# Patient Record
Sex: Female | Born: 1950
Health system: Southern US, Community
[De-identification: ages and names within clinical notes are randomized; demographics above are authoritative.]

## PROBLEM LIST (undated history)

## (undated) DIAGNOSIS — I255 Ischemic cardiomyopathy: Secondary | ICD-10-CM

## (undated) DIAGNOSIS — I509 Heart failure, unspecified: Secondary | ICD-10-CM

## (undated) DIAGNOSIS — Z720 Tobacco use: Secondary | ICD-10-CM

## (undated) DIAGNOSIS — I2510 Atherosclerotic heart disease of native coronary artery without angina pectoris: Secondary | ICD-10-CM

## (undated) DIAGNOSIS — E785 Hyperlipidemia, unspecified: Secondary | ICD-10-CM

## (undated) DIAGNOSIS — Z9114 Patient's other noncompliance with medication regimen: Secondary | ICD-10-CM

## (undated) DIAGNOSIS — Z91148 Patient's other noncompliance with medication regimen for other reason: Secondary | ICD-10-CM

## (undated) DIAGNOSIS — K219 Gastro-esophageal reflux disease without esophagitis: Secondary | ICD-10-CM

## (undated) DIAGNOSIS — I10 Essential (primary) hypertension: Secondary | ICD-10-CM

## (undated) HISTORY — DX: Hyperlipidemia, unspecified: E78.5

## (undated) HISTORY — DX: Tobacco use: Z72.0

## (undated) HISTORY — DX: Essential (primary) hypertension: I10

## (undated) HISTORY — DX: Patient's other noncompliance with medication regimen: Z91.14

## (undated) HISTORY — DX: Patient's other noncompliance with medication regimen for other reason: Z91.148

## (undated) HISTORY — PX: CHOLECYSTECTOMY: SHX55

## (undated) HISTORY — PX: PARTIAL HYSTERECTOMY: SHX80

---

## 2014-05-03 ENCOUNTER — Encounter (HOSPITAL_COMMUNITY): Payer: Self-pay

## 2014-05-03 ENCOUNTER — Inpatient Hospital Stay (HOSPITAL_COMMUNITY): Payer: No Typology Code available for payment source

## 2014-05-03 ENCOUNTER — Encounter (HOSPITAL_COMMUNITY): Payer: Self-pay | Admitting: Emergency Medicine

## 2014-05-03 ENCOUNTER — Encounter (HOSPITAL_COMMUNITY)
Admission: EM | Disposition: A | Discharge status: Home / Self Care | Payer: No Typology Code available for payment source | Source: Home / Self Care | Attending: Cardiology

## 2014-05-03 ENCOUNTER — Encounter (HOSPITAL_COMMUNITY)
Admission: EM | Disposition: A | Discharge status: Home / Self Care | Payer: Self-pay | Source: Home / Self Care | Attending: Cardiology

## 2014-05-03 ENCOUNTER — Other Ambulatory Visit: Payer: Self-pay

## 2014-05-03 ENCOUNTER — Ambulatory Visit (HOSPITAL_COMMUNITY): Admit: 2014-05-03 | Payer: No Typology Code available for payment source | Admitting: Cardiology

## 2014-05-03 ENCOUNTER — Inpatient Hospital Stay (HOSPITAL_COMMUNITY)
Admission: EM | Admit: 2014-05-03 | Discharge: 2014-05-06 | DRG: 246 | Disposition: A | Discharge status: Home / Self Care | Payer: No Typology Code available for payment source | Attending: Cardiology | Admitting: Cardiology

## 2014-05-03 DIAGNOSIS — Z720 Tobacco use: Secondary | ICD-10-CM | POA: Diagnosis present

## 2014-05-03 DIAGNOSIS — R7989 Other specified abnormal findings of blood chemistry: Secondary | ICD-10-CM | POA: Diagnosis present

## 2014-05-03 DIAGNOSIS — I059 Rheumatic mitral valve disease, unspecified: Secondary | ICD-10-CM | POA: Diagnosis present

## 2014-05-03 DIAGNOSIS — E7800 Pure hypercholesterolemia, unspecified: Secondary | ICD-10-CM

## 2014-05-03 DIAGNOSIS — I2109 ST elevation (STEMI) myocardial infarction involving other coronary artery of anterior wall: Secondary | ICD-10-CM

## 2014-05-03 DIAGNOSIS — I10 Essential (primary) hypertension: Secondary | ICD-10-CM | POA: Diagnosis present

## 2014-05-03 DIAGNOSIS — R945 Abnormal results of liver function studies: Secondary | ICD-10-CM

## 2014-05-03 DIAGNOSIS — F17200 Nicotine dependence, unspecified, uncomplicated: Secondary | ICD-10-CM | POA: Diagnosis present

## 2014-05-03 DIAGNOSIS — E876 Hypokalemia: Secondary | ICD-10-CM | POA: Diagnosis present

## 2014-05-03 DIAGNOSIS — I255 Ischemic cardiomyopathy: Secondary | ICD-10-CM | POA: Diagnosis present

## 2014-05-03 DIAGNOSIS — I2589 Other forms of chronic ischemic heart disease: Secondary | ICD-10-CM | POA: Diagnosis present

## 2014-05-03 DIAGNOSIS — N179 Acute kidney failure, unspecified: Secondary | ICD-10-CM | POA: Diagnosis not present

## 2014-05-03 DIAGNOSIS — R7401 Elevation of levels of liver transaminase levels: Secondary | ICD-10-CM

## 2014-05-03 DIAGNOSIS — I213 ST elevation (STEMI) myocardial infarction of unspecified site: Secondary | ICD-10-CM | POA: Diagnosis present

## 2014-05-03 DIAGNOSIS — I2510 Atherosclerotic heart disease of native coronary artery without angina pectoris: Secondary | ICD-10-CM

## 2014-05-03 DIAGNOSIS — I509 Heart failure, unspecified: Secondary | ICD-10-CM | POA: Diagnosis present

## 2014-05-03 DIAGNOSIS — I5021 Acute systolic (congestive) heart failure: Secondary | ICD-10-CM | POA: Diagnosis present

## 2014-05-03 DIAGNOSIS — R7309 Other abnormal glucose: Secondary | ICD-10-CM | POA: Diagnosis present

## 2014-05-03 DIAGNOSIS — E785 Hyperlipidemia, unspecified: Secondary | ICD-10-CM | POA: Diagnosis not present

## 2014-05-03 DIAGNOSIS — R740 Nonspecific elevation of levels of transaminase and lactic acid dehydrogenase [LDH]: Secondary | ICD-10-CM

## 2014-05-03 HISTORY — PX: LEFT HEART CATHETERIZATION WITH CORONARY ANGIOGRAM: SHX5451

## 2014-05-03 HISTORY — DX: Gastro-esophageal reflux disease without esophagitis: K21.9

## 2014-05-03 HISTORY — PX: PERCUTANEOUS STENT INTERVENTION: SHX5500

## 2014-05-03 HISTORY — PX: CORONARY ANGIOPLASTY WITH STENT PLACEMENT: SHX49

## 2014-05-03 LAB — CBC
HCT: 43.2 % (ref 36.0–46.0)
Hemoglobin: 14.2 g/dL (ref 12.0–15.0)
MCH: 29.2 pg (ref 26.0–34.0)
MCHC: 32.9 g/dL (ref 30.0–36.0)
MCV: 88.9 fL (ref 78.0–100.0)
Platelets: 358 10*3/uL (ref 150–400)
RBC: 4.86 MIL/uL (ref 3.87–5.11)
RDW: 13.1 % (ref 11.5–15.5)
WBC: 11.5 10*3/uL — ABNORMAL HIGH (ref 4.0–10.5)

## 2014-05-03 LAB — I-STAT TROPONIN, ED: Troponin i, poc: 0.05 ng/mL (ref 0.00–0.08)

## 2014-05-03 LAB — COMPREHENSIVE METABOLIC PANEL
ALT: 19 U/L (ref 0–35)
AST: 20 U/L (ref 0–37)
Albumin: 3.9 g/dL (ref 3.5–5.2)
Alkaline Phosphatase: 182 U/L — ABNORMAL HIGH (ref 39–117)
BUN: 13 mg/dL (ref 6–23)
CO2: 23 mEq/L (ref 19–32)
Calcium: 9.6 mg/dL (ref 8.4–10.5)
Chloride: 103 mEq/L (ref 96–112)
Creatinine, Ser: 0.88 mg/dL (ref 0.50–1.10)
GFR calc Af Amer: 80 mL/min — ABNORMAL LOW (ref >90)
GFR calc non Af Amer: 69 mL/min — ABNORMAL LOW (ref >90)
Glucose, Bld: 156 mg/dL — ABNORMAL HIGH (ref 70–99)
Potassium: 3.4 mEq/L — ABNORMAL LOW (ref 3.7–5.3)
Sodium: 143 mEq/L (ref 137–147)
Total Bilirubin: 0.4 mg/dL (ref 0.3–1.2)
Total Protein: 7.8 g/dL (ref 6.0–8.3)

## 2014-05-03 LAB — I-STAT CHEM 8, ED
BUN: 11 mg/dL (ref 6–23)
Calcium, Ion: 1.13 mmol/L (ref 1.13–1.30)
Chloride: 107 mEq/L (ref 96–112)
Creatinine, Ser: 0.9 mg/dL (ref 0.50–1.10)
Glucose, Bld: 159 mg/dL — ABNORMAL HIGH (ref 70–99)
HCT: 46 % (ref 36.0–46.0)
Hemoglobin: 15.6 g/dL — ABNORMAL HIGH (ref 12.0–15.0)
Potassium: 3.2 mEq/L — ABNORMAL LOW (ref 3.7–5.3)
Sodium: 144 mEq/L (ref 137–147)
TCO2: 22 mmol/L (ref 0–100)

## 2014-05-03 LAB — PROTIME-INR
INR: 0.89 (ref 0.00–1.49)
Prothrombin Time: 11.9 seconds (ref 11.6–15.2)

## 2014-05-03 LAB — MRSA PCR SCREENING: MRSA by PCR: NEGATIVE

## 2014-05-03 LAB — APTT: aPTT: 27 seconds (ref 24–37)

## 2014-05-03 LAB — POCT ACTIVATED CLOTTING TIME: Activated Clotting Time: 692 seconds

## 2014-05-03 SURGERY — LEFT HEART CATHETERIZATION WITH CORONARY ANGIOGRAM
Anesthesia: LOCAL

## 2014-05-03 SURGERY — LEFT HEART CATHETERIZATION WITH CORONARY ANGIOGRAM
Anesthesia: Choice | Laterality: Bilateral

## 2014-05-03 MED ORDER — NITROGLYCERIN 0.4 MG SL SUBL
SUBLINGUAL_TABLET | SUBLINGUAL | Status: AC
  Filled 2014-05-03: qty 1

## 2014-05-03 MED ORDER — CARVEDILOL 6.25 MG PO TABS
6.2500 mg | ORAL_TABLET | Freq: Two times a day (BID) | ORAL | Status: DC
  Administered 2014-05-04 – 2014-05-06 (×5): 6.25 mg via ORAL
  Filled 2014-05-03 (×7): qty 1

## 2014-05-03 MED ORDER — NITROGLYCERIN 0.4 MG SL SUBL
0.4000 mg | SUBLINGUAL_TABLET | SUBLINGUAL | Status: DC | PRN
  Administered 2014-05-03 (×2): 0.4 mg via SUBLINGUAL

## 2014-05-03 MED ORDER — ENOXAPARIN SODIUM 40 MG/0.4ML ~~LOC~~ SOLN
40.0000 mg | Freq: Every day | SUBCUTANEOUS | Status: DC
  Administered 2014-05-04 – 2014-05-05 (×3): 40 mg via SUBCUTANEOUS
  Filled 2014-05-03 (×4): qty 0.4

## 2014-05-03 MED ORDER — SODIUM CHLORIDE 0.9 % IJ SOLN: 3.0000 mL | INTRAMUSCULAR | Status: DC | PRN

## 2014-05-03 MED ORDER — METOPROLOL TARTRATE 1 MG/ML IV SOLN
INTRAVENOUS | Status: AC
  Filled 2014-05-03: qty 5

## 2014-05-03 MED ORDER — TICAGRELOR 90 MG PO TABS
90.0000 mg | ORAL_TABLET | Freq: Two times a day (BID) | ORAL | Status: DC
  Administered 2014-05-03 – 2014-05-06 (×6): 90 mg via ORAL
  Filled 2014-05-03 (×7): qty 1

## 2014-05-03 MED ORDER — MIDAZOLAM HCL 2 MG/2ML IJ SOLN
INTRAMUSCULAR | Status: AC
  Filled 2014-05-03: qty 2

## 2014-05-03 MED ORDER — ALPRAZOLAM 0.25 MG PO TABS
0.2500 mg | ORAL_TABLET | Freq: Two times a day (BID) | ORAL | Status: DC | PRN
  Administered 2014-05-04: 0.25 mg via ORAL
  Filled 2014-05-03: qty 1

## 2014-05-03 MED ORDER — ONDANSETRON HCL 4 MG/2ML IJ SOLN
4.0000 mg | Freq: Four times a day (QID) | INTRAMUSCULAR | Status: DC | PRN
  Administered 2014-05-04: 4 mg via INTRAVENOUS
  Filled 2014-05-03: qty 2

## 2014-05-03 MED ORDER — TICAGRELOR 90 MG PO TABS
ORAL_TABLET | ORAL | Status: AC
  Filled 2014-05-03: qty 2

## 2014-05-03 MED ORDER — SODIUM CHLORIDE 0.9 % IJ SOLN
3.0000 mL | Freq: Two times a day (BID) | INTRAMUSCULAR | Status: DC
  Administered 2014-05-03 – 2014-05-05 (×5): 3 mL via INTRAVENOUS

## 2014-05-03 MED ORDER — SODIUM CHLORIDE 0.9 % IV SOLN
INTRAVENOUS | Status: DC
  Administered 2014-05-03: 10 mL/h via INTRAVENOUS

## 2014-05-03 MED ORDER — HEPARIN SODIUM (PORCINE) 5000 UNIT/ML IJ SOLN
4000.0000 [IU] | INTRAMUSCULAR | Status: AC
  Administered 2014-05-03: 4000 [IU] via INTRAVENOUS

## 2014-05-03 MED ORDER — BIVALIRUDIN 250 MG IV SOLR
INTRAVENOUS | Status: AC
  Filled 2014-05-03: qty 250

## 2014-05-03 MED ORDER — TICAGRELOR 90 MG PO TABS
ORAL_TABLET | ORAL | Status: AC
  Filled 2014-05-03: qty 1

## 2014-05-03 MED ORDER — ASPIRIN 81 MG PO CHEW
81.0000 mg | CHEWABLE_TABLET | Freq: Every day | ORAL | Status: DC
Start: 2014-05-03 — End: 2014-05-06
  Administered 2014-05-04 – 2014-05-06 (×3): 81 mg via ORAL
  Filled 2014-05-03 (×3): qty 1

## 2014-05-03 MED ORDER — METOPROLOL TARTRATE 1 MG/ML IV SOLN
INTRAVENOUS | Status: AC
  Administered 2014-05-03: 5 mg
  Filled 2014-05-03: qty 5

## 2014-05-03 MED ORDER — ZOLPIDEM TARTRATE 5 MG PO TABS: 5.0000 mg | ORAL_TABLET | Freq: Every evening | ORAL | Status: DC | PRN

## 2014-05-03 MED ORDER — ACETAMINOPHEN 325 MG PO TABS
650.0000 mg | ORAL_TABLET | ORAL | Status: DC | PRN
  Administered 2014-05-03: 650 mg via ORAL
  Filled 2014-05-03: qty 2

## 2014-05-03 MED ORDER — ASPIRIN EC 81 MG PO TBEC: 81.0000 mg | DELAYED_RELEASE_TABLET | Freq: Every day | ORAL | Status: DC

## 2014-05-03 MED ORDER — ATORVASTATIN CALCIUM 80 MG PO TABS
80.0000 mg | ORAL_TABLET | Freq: Every day | ORAL | Status: DC
  Administered 2014-05-04 – 2014-05-05 (×2): 80 mg via ORAL
  Filled 2014-05-03 (×3): qty 1

## 2014-05-03 MED ORDER — SODIUM CHLORIDE 0.9 % IV SOLN: 250.0000 mL | INTRAVENOUS | Status: DC | PRN

## 2014-05-03 MED ORDER — ASPIRIN 81 MG PO CHEW: 324.0000 mg | CHEWABLE_TABLET | Freq: Once | ORAL | Status: DC

## 2014-05-03 MED ORDER — HEPARIN SODIUM (PORCINE) 5000 UNIT/ML IJ SOLN
5000.0000 [IU] | Freq: Three times a day (TID) | INTRAMUSCULAR | Status: DC
  Filled 2014-05-03: qty 1

## 2014-05-03 MED ORDER — FUROSEMIDE 10 MG/ML IJ SOLN
40.0000 mg | Freq: Two times a day (BID) | INTRAMUSCULAR | Status: DC
  Administered 2014-05-03: 40 mg via INTRAVENOUS
  Filled 2014-05-03 (×3): qty 4

## 2014-05-03 MED ORDER — SODIUM CHLORIDE 0.9 % IV SOLN
INTRAVENOUS | Status: AC
Start: 2014-05-03 — End: 2014-05-04
  Administered 2014-05-03: 22:00:00 via INTRAVENOUS

## 2014-05-03 MED ORDER — HEPARIN SODIUM (PORCINE) 5000 UNIT/ML IJ SOLN
INTRAMUSCULAR | Status: AC
  Administered 2014-05-03: 4000 [IU] via INTRAVENOUS
  Filled 2014-05-03: qty 1

## 2014-05-03 MED ORDER — LABETALOL HCL 5 MG/ML IV SOLN
10.0000 mg | INTRAVENOUS | Status: DC | PRN
  Administered 2014-05-03 – 2014-05-04 (×2): 10 mg via INTRAVENOUS
  Filled 2014-05-03 (×2): qty 4

## 2014-05-03 MED ORDER — ATORVASTATIN CALCIUM 80 MG PO TABS: 80.0000 mg | ORAL_TABLET | Freq: Every day | ORAL | Status: DC

## 2014-05-03 MED ORDER — NITROGLYCERIN 0.4 MG SL SUBL: 0.4000 mg | SUBLINGUAL_TABLET | SUBLINGUAL | Status: DC | PRN

## 2014-05-03 NOTE — ED Notes (Signed)
Cardiology at bedside.

## 2014-05-03 NOTE — ED Notes (Signed)
Pt placed on zoll pads 

## 2014-05-03 NOTE — ED Notes (Addendum)
EMS-pt was on her way home from work when she pulled her car over because of central chest pain with sob, diaphoresis, and nausea. Pt diaphoretic on arrival to department. Vomiting x 1. 20g(L)hand. 324mg  ASA. Pt very uncomfortable and unable to sit still in the bed.

## 2014-05-03 NOTE — ED Notes (Signed)
EDP at bedside  

## 2014-05-03 NOTE — H&P (Signed)
History and Physical   Patient ID: Deborah Ho MRN: [id number], DOB/AGE: 04/16/1951 63 y.o. Date of Encounter: 05/03/2014  Primary Physician: No primary provider on file. Primary Cardiologist: none  Chief Complaint:  STEMI  HPI: Deborah Ho is a 63 y.o. female with no history of CAD.  She has not had a checkup in over a year, but that denies any history of hypertension, hyperlipidemia or diabetes. She smokes a few cigarettes a day and walks regularly, but does not stick tightly to a hard-healthy diet.  Today she was in her usual state of health when she had sudden onset of nausea and diaphoresis. She thought she had a GI illness after eating sausage for lunch. However, she developed chest pain that reached a 10/10, and was significantly diaphoretic. She tried to drive to the emergency room but had trouble doing this because her pain was so severe. Her symptoms began approximately 5 PM today.  In the emergency room, her ECG was consistent with an anterior STEMI. She was also hypertensive with a systolic blood pressure greater than 170. The Cath Lab was activated. She was given aspirin 324 mg, sublingual nitroglycerin x1, heparin 4000 units, and metoprolol 5 mg IV. Her ECG is improved but she continued to experience severe substernal chest pain. She was taken directly to the cath lab.  She has never had symptoms like this before. She has no history of exertional shortness of breath or chest pain.   Past Medical History  Diagnosis Date  . GERD (gastroesophageal reflux disease)     Probable    Surgical History:  Past Surgical History  Procedure Laterality Date  . Partial hysterectomy    . Cholecystectomy       I have reviewed the patient's current medications. Prior to Admission medications   none   Scheduled Meds: . [facility] HOLD] aspirin  324 mg Oral Once   Continuous Infusions: . sodium chloride 10 mL/hr (05/03/14 1907)   PRN Meds:.[MAR HOLD]  nitroGLYCERIN  Allergies: none  History   Social History  . Marital Status: Widowed    Spouse Name: N/A    Number of Children: N/A  . Years of Education: N/A   Occupational History  . not employed    Social History Main Topics  . Smoking status: Current Every Day Smoker -- 0.25 packs/day for 40 years    Types: Cigarettes  . Smokeless tobacco: Never Used  . Alcohol Use: No  . Drug Use: No  . Sexual Activity: Not on file   Other Topics Concern  . Not on file   Social History Narrative   Patient has 6 brothers and sisters and none have known coronary artery disease. She lives alone in [place], but has several brothers in the area.    Family Status  Relation Status Death Age  . Mother Deceased     no history of CAD  . Father Deceased     no information available    Review of Systems:   Full 14-point review of systems otherwise negative except as noted above.  Physical Exam: Blood pressure 173/100, pulse 98, temperature 96.7 F (35.9 C), temperature source Oral, resp. rate 20, height 5\' 5"  (1.651 m), weight 175 lb (79.379 kg), SpO2 100.00%. General: Well developed, well nourished,female in acute distress. Head: Normocephalic, atraumatic, sclera non-icteric, no xanthomas, nares are without discharge. Dentition: poor Neck: No carotid bruits. JVD not elevated, but difficult to assess due to body habitus. No thyromegally Lungs: Good expansion  bilaterally. without wheezes or rhonchi. Bilateral Rales Heart: Regular rate and rhythm with S1 S2.  No S3 or S4.  No murmur, no rubs, or gallops appreciated. Abdomen: Soft, non-tender, non-distended with normoactive bowel sounds. No hepatomegaly. No rebound/guarding. No obvious abdominal masses. Msk:  Strength and tone appear normal for age. No joint deformities or effusions, no spine or costo-vertebral angle tenderness. Extremities: No clubbing or cyanosis. No edema.  Distal pedal pulses are 2+ in 4 extrem Neuro: Alert and oriented  X 3. Moves all extremities spontaneously. No focal deficits noted. Psych:  Responds to questions appropriately with a normal affect. Skin: No rashes or lesions noted  Labs:   Lab Results  Component Value Date   WBC 11.5* 05/03/2014   HGB 15.6* 05/03/2014   HCT 46.0 05/03/2014   MCV 88.9 05/03/2014   PLT 358 05/03/2014    Recent Labs  05/03/14 1850  INR 0.89     Recent Labs Lab 05/03/14 1850 05/03/14 1857  NA 143 144  K 3.4* 3.2*  CL 103 107  CO2 23  --   BUN 13 11  CREATININE 0.88 0.90  CALCIUM 9.6  --   PROT 7.8  --   BILITOT 0.4  --   ALKPHOS 182*  --   ALT 19  --   AST 20  --   GLUCOSE 156* 159*    Recent Labs  05/03/14 1855  TROPIPOC 0.05    Radiology/Studies: pending   ECG: initial ECG was sinus rhythm with a 3 mm anterior ST elevation. Repeat ECG showed improvement in the ST elevations but is still abnormal. Additionally she was having frequent PVCs and pairs on telemetry.  ASSESSMENT AND PLAN:  Principal Problem:   ST elevation myocardial infarction (STEMI) of anterior wall, initial episode of care - she is being taken directly to the Cath Lab with further evaluation and treatment depending on the results. She will be counseled on smoking cessation. She'll be screened for cardiac risk factors including diabetes, hypertension and hyperlipidemia. She will be educated on heart-healthy lifestyle modifications. Further evaluation and treatment will depend on the results.   Melida Quitter, PA-C 05/03/2014 7:33 PM Beeper [phone]  Patient seen and examined and history reviewed. Agree with above findings and plan. 63 yo BF with history of tobacco abuse but otherwise no known risk factors. Acute onset of chest pain today. Ecg shows anterior ST elevation. Pain waxing and waning in intensity. Reports remote cardiac cath in [place] years ago without significant disease. Recommend emergent cardiac cath with PCI. Assess risk factors with lipid panel and A1c.  She has rales on exam and will need diuresis.   Graciela Plato [place], MDFACC 05/03/2014 8:07 PM

## 2014-05-03 NOTE — ED Notes (Signed)
Labs being drawn at this time.

## 2014-05-03 NOTE — ED Notes (Signed)
Transported to cath lab 

## 2014-05-03 NOTE — Progress Notes (Signed)
Chaplains responded earlier to STEMI. Patient relocated to [facility]. Pt requested prayer and to call a family member.  Deborah Ho

## 2014-05-03 NOTE — CV Procedure (Signed)
    Cardiac Catheterization Procedure Note  Name: Deborah Ho MRN: [id number] DOB: Jul 05, 1951  Procedure: Left Heart Cath, Selective Coronary Angiography, LV angiography, PTCA and stenting of the proximal LAD  Indication: 64 yo BF with an acute anterior STEMI  Procedural Details:  The right wrist was prepped, draped, and anesthetized with 1% lidocaine. Using the modified Seldinger technique, a 6 French slender sheath was introduced into the right radial artery. 3 mg of verapamil was administered through the sheath, weight-based unfractionated heparin was administered intravenously. Standard Judkins catheters were used for selective coronary angiography and left ventriculography. Catheter exchanges were performed over an exchange length guidewire.  PROCEDURAL FINDINGS Hemodynamics: AO 162/105 mean 130 mm Hg LV 158/26 mm Hg   Coronary angiography: Coronary dominance: right  Left mainstem: Normal  Left anterior descending (LAD): 95% proximal LAD stenosis with TIMI 2 flow.  Ramus intermediate: moderate sized vessel. Mild disease less than 20%.  Left circumflex (LCx): The LCx has 30% proximal disease. The LCx trifurcates into 3 OM branches. After the first branch there is a 60-70% stenosis in the OM prior to the next bifurcation.   Right coronary artery (RCA): The RCA arises low in the coronary cusp and was engaged with a Williams right catheter. This demonstrates a long segment of disease with 70-80% disease proximally tapering to 80-90% in the mid vessel.   Left ventriculography: Left ventricular systolic function is abnormal, LVEF is estimated at 35%, there is severe anterior hypokinesis with akinesis of the anterior apex and inferior apex. There is mild mitral regurgitation   PCI Note:  Following the diagnostic procedure, the decision was made to proceed with PCI.  Weight-based bivalirudin was given for anticoagulation. Brilinta 180 mg was given orally. Once a therapeutic ACT was  achieved, a 6 [place] XBLAD 3.5 guide catheter was inserted.  A prowater coronary guidewire was used to cross the lesion.  The lesion was predilated with a 2.0 mm balloon.  The lesion was then stented with a 3.0 x 23 mm Xience Alpine stent.  The stent was postdilated with a 3.25 mm noncompliant balloon.  Following PCI, there was 0% residual stenosis and TIMI-3 flow. Final angiography confirmed an excellent result. The patient tolerated the procedure well. There were no immediate procedural complications. A TR band was used for radial hemostasis. The patient was transferred to the post catheterization recovery area for further monitoring.  PCI Data: Vessel - LAD/Segment - proximal Percent Stenosis (pre)  95% TIMI-flow 2 Stent 3.0 x 23 mm Xience Alpine Percent Stenosis (post) 0% TIMI-flow (post) 3  Final Conclusions:   1. Severe 3 vessel obstructive CAD. Culprit lesion in the proximal LAD. 2. Severe LV dysfunction. 3. Successful stenting of the proximal LAD with a DES.   Recommendations:  Continue DAPT for one year. IV diuresis. BP control. May consider patient for enrollment in Complete trial for her residual disease.   Deborah Ho, MDFACC 05/03/2014, 8:17 PM

## 2014-05-03 NOTE — ED Notes (Signed)
EDP calling STEMI

## 2014-05-03 NOTE — ED Notes (Signed)
2 bags of valuables sent with patient

## 2014-05-04 DIAGNOSIS — I5021 Acute systolic (congestive) heart failure: Secondary | ICD-10-CM

## 2014-05-04 DIAGNOSIS — E7800 Pure hypercholesterolemia, unspecified: Secondary | ICD-10-CM

## 2014-05-04 DIAGNOSIS — I219 Acute myocardial infarction, unspecified: Secondary | ICD-10-CM

## 2014-05-04 DIAGNOSIS — R7402 Elevation of levels of lactic acid dehydrogenase (LDH): Secondary | ICD-10-CM

## 2014-05-04 DIAGNOSIS — R740 Nonspecific elevation of levels of transaminase and lactic acid dehydrogenase [LDH]: Secondary | ICD-10-CM

## 2014-05-04 DIAGNOSIS — F17200 Nicotine dependence, unspecified, uncomplicated: Secondary | ICD-10-CM

## 2014-05-04 DIAGNOSIS — R7401 Elevation of levels of liver transaminase levels: Secondary | ICD-10-CM

## 2014-05-04 DIAGNOSIS — I509 Heart failure, unspecified: Secondary | ICD-10-CM

## 2014-05-04 DIAGNOSIS — I10 Essential (primary) hypertension: Secondary | ICD-10-CM

## 2014-05-04 DIAGNOSIS — I517 Cardiomegaly: Secondary | ICD-10-CM

## 2014-05-04 LAB — LIPID PANEL
Cholesterol: 231 mg/dL — ABNORMAL HIGH (ref 0–200)
HDL: 56 mg/dL (ref >39)
LDL Cholesterol: 151 mg/dL — ABNORMAL HIGH (ref 0–99)
Total CHOL/HDL Ratio: 4.1 RATIO
Triglycerides: 118 mg/dL (ref <150)
VLDL: 24 mg/dL (ref 0–40)

## 2014-05-04 LAB — CBC
HCT: 42.7 % (ref 36.0–46.0)
HCT: 42.1 % (ref 36.0–46.0)
Hemoglobin: 14.3 g/dL (ref 12.0–15.0)
Hemoglobin: 14 g/dL (ref 12.0–15.0)
MCH: 29.3 pg (ref 26.0–34.0)
MCH: 29.3 pg (ref 26.0–34.0)
MCHC: 33.5 g/dL (ref 30.0–36.0)
MCHC: 33.3 g/dL (ref 30.0–36.0)
MCV: 87.5 fL (ref 78.0–100.0)
MCV: 88.1 fL (ref 78.0–100.0)
Platelets: 339 10*3/uL (ref 150–400)
Platelets: 317 10*3/uL (ref 150–400)
RBC: 4.88 MIL/uL (ref 3.87–5.11)
RBC: 4.78 MIL/uL (ref 3.87–5.11)
RDW: 13.2 % (ref 11.5–15.5)
RDW: 13 % (ref 11.5–15.5)
WBC: 12.6 10*3/uL — ABNORMAL HIGH (ref 4.0–10.5)
WBC: 14.2 10*3/uL — ABNORMAL HIGH (ref 4.0–10.5)

## 2014-05-04 LAB — CREATININE, SERUM
Creatinine, Ser: 0.78 mg/dL (ref 0.50–1.10)
GFR calc Af Amer: >90 mL/min (ref >90)
GFR calc non Af Amer: 88 mL/min — ABNORMAL LOW (ref >90)

## 2014-05-04 LAB — PRO B NATRIURETIC PEPTIDE: Pro B Natriuretic peptide (BNP): 1714 pg/mL — ABNORMAL HIGH (ref 0–125)

## 2014-05-04 LAB — TROPONIN I: Troponin I: >20 ng/mL (ref <0.30)

## 2014-05-04 LAB — COMPREHENSIVE METABOLIC PANEL
ALT: 37 U/L — ABNORMAL HIGH (ref 0–35)
AST: 269 U/L — ABNORMAL HIGH (ref 0–37)
Albumin: 3.6 g/dL (ref 3.5–5.2)
Alkaline Phosphatase: 159 U/L — ABNORMAL HIGH (ref 39–117)
BUN: 11 mg/dL (ref 6–23)
CO2: 24 mEq/L (ref 19–32)
Calcium: 9.6 mg/dL (ref 8.4–10.5)
Chloride: 100 mEq/L (ref 96–112)
Creatinine, Ser: 0.84 mg/dL (ref 0.50–1.10)
GFR calc Af Amer: 85 mL/min — ABNORMAL LOW (ref >90)
GFR calc non Af Amer: 73 mL/min — ABNORMAL LOW (ref >90)
Glucose, Bld: 112 mg/dL — ABNORMAL HIGH (ref 70–99)
Potassium: 3.8 mEq/L (ref 3.7–5.3)
Sodium: 140 mEq/L (ref 137–147)
Total Bilirubin: 0.5 mg/dL (ref 0.3–1.2)
Total Protein: 7.4 g/dL (ref 6.0–8.3)

## 2014-05-04 LAB — MAGNESIUM: Magnesium: 2 mg/dL (ref 1.5–2.5)

## 2014-05-04 LAB — HEMOGLOBIN A1C
Hgb A1c MFr Bld: 5.8 % — ABNORMAL HIGH (ref <5.7)
Mean Plasma Glucose: 120 mg/dL — ABNORMAL HIGH (ref <117)

## 2014-05-04 MED ORDER — PANTOPRAZOLE SODIUM 40 MG PO TBEC
40.0000 mg | DELAYED_RELEASE_TABLET | Freq: Every day | ORAL | Status: DC
  Administered 2014-05-04 – 2014-05-06 (×3): 40 mg via ORAL
  Filled 2014-05-04 (×3): qty 1

## 2014-05-04 MED ORDER — LISINOPRIL 10 MG PO TABS
10.0000 mg | ORAL_TABLET | Freq: Every day | ORAL | Status: DC
  Administered 2014-05-04: 10 mg via ORAL
  Filled 2014-05-04 (×2): qty 1

## 2014-05-04 MED ORDER — PERFLUTREN LIPID MICROSPHERE
INTRAVENOUS | Status: AC
  Administered 2014-05-04: 4 mL
  Filled 2014-05-04: qty 10

## 2014-05-04 MED ORDER — FUROSEMIDE 40 MG PO TABS
40.0000 mg | ORAL_TABLET | Freq: Every day | ORAL | Status: DC
  Administered 2014-05-04: 40 mg via ORAL
  Filled 2014-05-04 (×2): qty 1

## 2014-05-04 MED ORDER — SPIRONOLACTONE 25 MG PO TABS
25.0000 mg | ORAL_TABLET | Freq: Every day | ORAL | Status: DC
  Administered 2014-05-04: 25 mg via ORAL
  Filled 2014-05-04 (×2): qty 1

## 2014-05-04 MED ORDER — POTASSIUM CHLORIDE CRYS ER 10 MEQ PO TBCR
EXTENDED_RELEASE_TABLET | ORAL | Status: AC
  Filled 2014-05-04: qty 4

## 2014-05-04 MED ORDER — PNEUMOCOCCAL VAC POLYVALENT 25 MCG/0.5ML IJ INJ
0.5000 mL | INJECTION | INTRAMUSCULAR | Status: DC
  Filled 2014-05-04: qty 0.5

## 2014-05-04 MED ORDER — POTASSIUM CHLORIDE CRYS ER 20 MEQ PO TBCR
40.0000 meq | EXTENDED_RELEASE_TABLET | Freq: Once | ORAL | Status: AC
  Administered 2014-05-04: 40 meq via ORAL
  Filled 2014-05-04: qty 2

## 2014-05-04 MED FILL — Sodium Chloride IV Soln 0.9%: INTRAVENOUS | Qty: 50 | Status: AC

## 2014-05-04 NOTE — Progress Notes (Signed)
Utilization review completed. Shanzay Hepworth, RN, BSN. 

## 2014-05-04 NOTE — Progress Notes (Signed)
CARDIAC REHAB PHASE I   PRE:  Rate/Rhythm: 95 SR    BP: sitting 116/66    SaO2:   MODE:  Ambulation: 350 ft   POST:  Rate/Rhythm: 103 ST    BP: sitting 135/74     SaO2:    Tolerated well. Denied problems although seemed somewhat SOB toward end of walk. Pt wanted to lie down after walk due to being up all morning. Began ed however she could not stay awake. Left materials for pt to read and will f/u in am for rest of ed. Pt very inquisitive. [phone]  Deborah Ho [place] CES, ACSM 05/04/2014 3:19 PM

## 2014-05-04 NOTE — Research (Signed)
COMPLETE Informed Consent   Subject Name: Deborah Ho  Subject met inclusion and exclusion criteria.  The informed consent form, study requirements and expectations were reviewed with the subject and questions and concerns were addressed prior to the signing of the consent form.  The subject verbalized understanding of the trail requirements.  The subject agreed to participate in the COMPLETE trial and signed the informed consent.  The informed consent was obtained prior to performance of any protocol-specific procedures for the subject.  A copy of the signed informed consent was given to the subject and a copy was placed in the subject's medical record.  [name] 05/04/2014, 12:33 pm

## 2014-05-04 NOTE — Progress Notes (Signed)
TELEMETRY: Reviewed telemetry pt in NSR with rare PVC triplet, short run of PACs: Filed Vitals:   05/04/14 0400 05/04/14 0505 05/04/14 0557 05/04/14 0735  BP: 180/104 200/104 157/51 152/85  Pulse: 88 92 81 87  Temp: 98.3 F (36.8 C)   98.2 F (36.8 C)  TempSrc: Oral   Oral  Resp: 20 16 18 19   Height:      Weight:      SpO2: 100% 100% 99% 99%    Intake/Output Summary (Last 24 hours) at 05/04/14 0753 Last data filed at 05/04/14 0500  Gross per 24 hour  Intake 349.17 ml  Output   1200 ml  Net -850.83 ml   Filed Weights   05/03/14 1847 05/03/14 2100  Weight: 175 lb (79.379 kg) 209 lb 3.5 oz (94.9 kg)    Subjective Patient complains of severe leg cramps. Chest pain resolved. No dyspnea. Had nausea and vomiting x 1.   05/05/14 aspirin  81 mg Oral Daily  . atorvastatin  80 mg Oral q1800  . carvedilol  6.25 mg Oral BID WC  . enoxaparin (LOVENOX) injection  40 mg Subcutaneous QHS  . furosemide  40 mg Oral Daily  . lisinopril  10 mg Oral Daily  . pantoprazole  40 mg Oral Daily  . sodium chloride  3 mL Intravenous Q12H  . spironolactone  25 mg Oral Daily  . ticagrelor  90 mg Oral BID   . sodium chloride 10 mL/hr (05/03/14 1907)    LABS: Basic Metabolic Panel:  Recent Labs  05/05/14 1850 05/03/14 1857 05/04/14 0201 05/04/14 0504  NA 143 144  --  140  K 3.4* 3.2*  --  3.8  CL 103 107  --  100  CO2 23  --   --  24  GLUCOSE 156* 159*  --  112*  BUN 13 11  --  11  CREATININE 0.88 0.90 0.78 0.84  CALCIUM 9.6  --   --  9.6  MG  --   --   --  2.0   Liver Function Tests:  Recent Labs  05/03/14 1850 05/04/14 0504  AST 20 269*  ALT 19 37*  ALKPHOS 182* 159*  BILITOT 0.4 0.5  PROT 7.8 7.4  ALBUMIN 3.9 3.6   No results found for this basename: LIPASE, AMYLASE,  in the last 72 hours CBC:  Recent Labs  05/04/14 0201 05/04/14 0504  WBC 12.6* 14.2*  HGB 14.3 14.0  HCT 42.7 42.1  MCV 87.5 88.1  PLT 339 317   Cardiac Enzymes:  Recent Labs  05/04/14 0201    TROPONINI >20.00*   BNP:  Recent Labs  05/04/14 0201  PROBNP 1714.0*   Hemoglobin A1C: No results found for this basename: HGBA1C,  in the last 72 hours Fasting Lipid Panel:  Recent Labs  05/04/14 0504  CHOL 231*  HDL 56  LDLCALC 151*  TRIG 118  CHOLHDL 4.1   Thyroid Function Tests: No results found for this basename: TSH, T4TOTAL, FREET3, T3FREE, THYROIDAB,  in the last 72 hours   Radiology/Studies:  Portable Chest X-ray 1 View  05/04/2014   CLINICAL DATA:  ST elevation myocardial infarction.  EXAM: PORTABLE CHEST - 1 VIEW  COMPARISON:  None.  FINDINGS: The lungs are well-aerated. Vascular congestion is noted, with mildly increased interstitial markings, raising question for minimal interstitial edema. There is no evidence of pleural effusion or pneumothorax.  The cardiomediastinal silhouette is normal in size. No acute osseous abnormalities are seen.  IMPRESSION:  Vascular congestion, with mildly increased interstitial markings, raising question for minimal interstitial edema.   Electronically Signed   By: Roanna Raider M.D.   On: 05/04/2014 01:16    PHYSICAL EXAM General: Well developed, obese, in no acute distress. Head: Normocephalic, atraumatic, sclera non-icteric, oropharynx is clear Neck: Negative for carotid bruits. JVD not elevated. No adenopathy Lungs: Clear bilaterally to auscultation without wheezes, rales, or rhonchi. Breathing is unlabored. Heart: RRR S1 S2 without murmurs, rubs, or gallops.  Abdomen: Soft, non-tender, non-distended with normoactive bowel sounds. No hepatomegaly. No rebound/guarding. No obvious abdominal masses. Msk:  Strength and tone appears normal for age. Extremities: No clubbing, cyanosis or edema.  Distal pedal pulses are 2+ and equal bilaterally. Radial cath site without hematoma. Neuro: Alert and oriented X 3. Moves all extremities spontaneously. Psych:  Responds to questions appropriately with a normal affect.  ASSESSMENT AND  PLAN: 1. Anterior STEMI. S/p DES of LAD. On ASA and Brilinta for at least one year. Needs to avoid NSAIDs. On PPI. Troponin > 20. Elevated transaminases due to MI. Significant LV dysfunction by cath. Will check Echo today. Adjust medical therapy with beta blocker, ACEi, aldactone, and statin.  2. Acute systolic CHF. EF 35% by cath. Check Echo. Adjust meds as above. Lungs much clearer today. Will switch IV lasix to po. Sodium restriction. Will need reassessment of LV function in 2-3 months  3. HTN. Poorly controlled. Patient states BP normal on last physical one year ago but now clearly quite high. Adjust meds as noted above.  4. Hypercholesterolemia. On high dose statin.   5. Hyperglycemia. 150s on admit. Now 112. A1c pending.   6. Elevated transaminases acute due to MI. Repeat in am.  7. Tobacco use. Patient reports infrequent smoking. Recommend cessation.  Will observe in ICU today. I spent 30 minutes in teaching/ counseling patient today.   Present on Admission:  . ST elevation myocardial infarction (STEMI) of anterior wall, initial episode of care . ST elevation myocardial infarction (STEMI) of anterior wall . STEMI (ST elevation myocardial infarction)  Signed, Peter [place], MDFACC 05/04/2014 7:53 AM

## 2014-05-04 NOTE — ED Provider Notes (Signed)
CSN: [id number]     Arrival date & time 05/03/14  1839 History   First MD Initiated Contact with Patient 05/03/14 1840     Chief Complaint  Patient presents with  . Chest Pain  . Shortness of Breath     (Consider location/radiation/quality/duration/timing/severity/associated sxs/prior Treatment) Patient is a 63 y.o. female presenting with chest pain and shortness of breath. The history is provided by the patient.  Chest Pain Pain location:  L chest Associated symptoms: shortness of breath   Associated symptoms: no abdominal pain, no back pain and no fatigue   Shortness of Breath Associated symptoms: chest pain   Associated symptoms: no abdominal pain    patient presents with left-sided chest pain that began about one hour prior to arrival. His pressure in her left chest and posterior left jaw and somewhat to her back. Initial EKG the EMS was concerning for inferior ST elevation. On arrival to the ER she had ST elevation anteriorly. Code STEMI was called. No GI bleeding. She's had mild nausea and some shortness of breath. She does not have known cardiac history, but states she has had a heart catheterization in the past.  Past Medical History  Diagnosis Date  . GERD (gastroesophageal reflux disease)     Probable   Past Surgical History  Procedure Laterality Date  . Partial hysterectomy    . Cholecystectomy     History reviewed. No pertinent family history. History  Substance Use Topics  . Smoking status: Current Every Day Smoker -- 0.25 packs/day for 40 years    Types: Cigarettes  . Smokeless tobacco: Never Used  . Alcohol Use: No   OB History   Grav Para Term Preterm Abortions TAB SAB Ect Mult Living                 Review of Systems  Constitutional: Negative for appetite change and fatigue.  Respiratory: Positive for shortness of breath.   Cardiovascular: Positive for chest pain.  Gastrointestinal: Negative for abdominal pain.  Musculoskeletal: Negative for back  pain.  Skin: Negative for wound.  Neurological: Negative for syncope and light-headedness.  Psychiatric/Behavioral: Negative for confusion.      Allergies  Review of patient's allergies indicates no known allergies.  Home Medications   Prior to Admission medications   Not on File   BP 153/104  Pulse 86  Temp(Src) 96.7 F (35.9 C) (Oral)  Resp 28  Ht 5' 5"  (1.651 m)  Wt 175 lb (79.379 kg)  BMI 29.12 kg/m2  SpO2 99% Physical Exam  Constitutional: She is oriented to person, place, and time. She appears well-developed and well-nourished.  Patient is obese  HENT:  Head: Normocephalic and atraumatic.  Eyes: EOM are normal. Pupils are equal, round, and reactive to light.  Neck: Normal range of motion.  Cardiovascular: Normal rate and regular rhythm.   Pulmonary/Chest: Effort normal.  Abdominal: Soft. Bowel sounds are normal. There is no tenderness.  Musculoskeletal: She exhibits no edema.  Neurological: She is alert and oriented to person, place, and time.  Skin: Skin is warm. She is diaphoretic.    ED Course  Procedures (including critical care time) Labs Review Labs Reviewed  CBC - Abnormal; Notable for the following:    WBC 11.5 (*)    All other components within normal limits  COMPREHENSIVE METABOLIC PANEL - Abnormal; Notable for the following:    Potassium 3.4 (*)    Glucose, Bld 156 (*)    Alkaline Phosphatase 182 (*)  GFR calc non Af Amer 69 (*)    GFR calc Af Amer 80 (*)    All other components within normal limits  I-STAT CHEM 8, ED - Abnormal; Notable for the following:    Potassium 3.2 (*)    Glucose, Bld 159 (*)    Hemoglobin 15.6 (*)    All other components within normal limits  MRSA PCR SCREENING  APTT  PROTIME-INR  COMPREHENSIVE METABOLIC PANEL  LIPID PANEL  HEMOGLOBIN A1C  PRO B NATRIURETIC PEPTIDE  CBC  CBC  CREATININE, SERUM  TROPONIN I  I-STAT TROPOININ, ED  POCT ACTIVATED CLOTTING TIME    Imaging Review No results found.    EKG Interpretation   Date/Time:  Thursday May 03 2014 18:42:46 EDT Ventricular Rate:  105 PR Interval:  165 QRS Duration: 73 QT Interval:  352 QTC Calculation: 465 R Axis:   31 Text Interpretation:  ** ** ACUTE MI / STEMI ** ** Sinus tachycardia  Ventricular premature complex Aberrant conduction of SV complex(es)  Anterior infarct, acute (LAD) Baseline wander in lead(s) III V5 Confirmed  by Laconda Basich  MD, Ovid Curd [phone]) on 05/03/2014 6:49:11 PM      MDM   Final diagnoses:  ST elevation myocardial infarction (STEMI), unspecified artery    Patient with acute ST elevation MI. Code STEMI called. Met in the ER by Dr. [place]. Taken emergently to cath lab upon arrival of the team.    Jasper Riling. Alvino Chapel, MD 05/04/14 4353

## 2014-05-04 NOTE — Progress Notes (Addendum)
  Echocardiogram 2D Echocardiogram with Definity has been performed.  Cathie Beams 05/04/2014, 12:27 PM

## 2014-05-05 DIAGNOSIS — N179 Acute kidney failure, unspecified: Secondary | ICD-10-CM

## 2014-05-05 LAB — HEPATIC FUNCTION PANEL
ALT: 30 U/L (ref 0–35)
AST: 105 U/L — ABNORMAL HIGH (ref 0–37)
Albumin: 3.6 g/dL (ref 3.5–5.2)
Alkaline Phosphatase: 144 U/L — ABNORMAL HIGH (ref 39–117)
Bilirubin, Direct: <0.2 mg/dL (ref 0.0–0.3)
Total Bilirubin: 0.7 mg/dL (ref 0.3–1.2)
Total Protein: 7.4 g/dL (ref 6.0–8.3)

## 2014-05-05 LAB — BASIC METABOLIC PANEL
BUN: 23 mg/dL (ref 6–23)
CO2: 22 mEq/L (ref 19–32)
Calcium: 9.4 mg/dL (ref 8.4–10.5)
Chloride: 98 mEq/L (ref 96–112)
Creatinine, Ser: 1.35 mg/dL — ABNORMAL HIGH (ref 0.50–1.10)
GFR calc Af Amer: 48 mL/min — ABNORMAL LOW (ref >90)
GFR calc non Af Amer: 41 mL/min — ABNORMAL LOW (ref >90)
Glucose, Bld: 108 mg/dL — ABNORMAL HIGH (ref 70–99)
Potassium: 3.6 mEq/L — ABNORMAL LOW (ref 3.7–5.3)
Sodium: 139 mEq/L (ref 137–147)

## 2014-05-05 MED ORDER — LISINOPRIL 5 MG PO TABS
5.0000 mg | ORAL_TABLET | Freq: Every day | ORAL | Status: DC
  Filled 2014-05-05: qty 1

## 2014-05-05 MED ORDER — POTASSIUM CHLORIDE CRYS ER 10 MEQ PO TBCR
EXTENDED_RELEASE_TABLET | ORAL | Status: AC
  Filled 2014-05-05: qty 4

## 2014-05-05 MED ORDER — POTASSIUM CHLORIDE CRYS ER 20 MEQ PO TBCR
40.0000 meq | EXTENDED_RELEASE_TABLET | Freq: Once | ORAL | Status: AC
  Administered 2014-05-05: 40 meq via ORAL
  Filled 2014-05-05: qty 2

## 2014-05-05 NOTE — Progress Notes (Signed)
CARDIAC REHAB PHASE I   PRE:  Rate/Rhythm: 97 SR  BP:  Sitting: 115/60     SaO2: 96 RA  MODE:  Ambulation: 350 ft   POST:  Rate/Rhythm: 100  BP:  Sitting: 114/55    SaO2:   Pt tolerated ambulation well with no c/o and no SOB. Pt walked 350 ft independently.  Reviewed education, exercise guidelines, smoking cessation and NTG use with pt.  Pt very responsive during education and stated she did not have questions at this time.  Pt interested in CRP II in [place].  [phone]  Marvene Staff MS, ACSM RCEP 9:22 AM 05/05/2014

## 2014-05-05 NOTE — Progress Notes (Signed)
TELEMETRY: Reviewed telemetry pt in NSR with rare PVC: Filed Vitals:   05/05/14 0405 05/05/14 0500 05/05/14 0600 05/05/14 0700  BP: 105/52 110/67 112/53   Pulse:      Temp: 98.3 F (36.8 C)     TempSrc: Oral     Resp:      Height:      Weight:    206 lb 5.6 oz (93.6 kg)  SpO2: 97%       Intake/Output Summary (Last 24 hours) at 05/05/14 0835 Last data filed at 05/04/14 1700  Gross per 24 hour  Intake    450 ml  Output    300 ml  Net    150 ml   Filed Weights   05/03/14 1847 05/03/14 2100 05/05/14 0700  Weight: 175 lb (79.379 kg) 209 lb 3.5 oz (94.9 kg) 206 lb 5.6 oz (93.6 kg)    Subjective Patient complains of some leg cramps. No chest pain or dyspnea. Feels well overall.   Marland Kitchen aspirin  81 mg Oral Daily  . atorvastatin  80 mg Oral q1800  . carvedilol  6.25 mg Oral BID WC  . enoxaparin (LOVENOX) injection  40 mg Subcutaneous QHS  . lisinopril  10 mg Oral Daily  . pantoprazole  40 mg Oral Daily  . pneumococcal 23 valent vaccine  0.5 mL Intramuscular Tomorrow-1000  . sodium chloride  3 mL Intravenous Q12H  . ticagrelor  90 mg Oral BID   . sodium chloride 10 mL/hr (05/03/14 1907)    LABS: Basic Metabolic Panel:  Recent Labs  16/10/96 1857  05/04/14 0504 05/05/14 0305  NA 144  --  140 139  K 3.2*  --  3.8 3.6*  CL 107  --  100 98  CO2  --   --  24 22  GLUCOSE 159*  --  112* 108*  BUN 11  --  11 23  CREATININE 0.90  < > 0.84 1.35*  CALCIUM  --   --  9.6 9.4  MG  --   --  2.0  --   < > = values in this interval not displayed. Liver Function Tests:  Recent Labs  05/03/14 1850 05/04/14 0504  AST 20 269*  ALT 19 37*  ALKPHOS 182* 159*  BILITOT 0.4 0.5  PROT 7.8 7.4  ALBUMIN 3.9 3.6   No results found for this basename: LIPASE, AMYLASE,  in the last 72 hours CBC:  Recent Labs  05/04/14 0201 05/04/14 0504  WBC 12.6* 14.2*  HGB 14.3 14.0  HCT 42.7 42.1  MCV 87.5 88.1  PLT 339 317   Cardiac Enzymes:  Recent Labs  05/04/14 0201  TROPONINI  >20.00*   BNP:  Recent Labs  05/04/14 0201  PROBNP 1714.0*   Hemoglobin A1C:  Recent Labs  05/04/14 0201  HGBA1C 5.8*   Fasting Lipid Panel:  Recent Labs  05/04/14 0504  CHOL 231*  HDL 56  LDLCALC 151*  TRIG 118  CHOLHDL 4.1   Thyroid Function Tests: No results found for this basename: TSH, T4TOTAL, FREET3, T3FREE, THYROIDAB,  in the last 72 hours   Radiology/Studies:  Portable Chest X-ray 1 View  05/04/2014   CLINICAL DATA:  ST elevation myocardial infarction.  EXAM: PORTABLE CHEST - 1 VIEW  COMPARISON:  None.  FINDINGS: The lungs are well-aerated. Vascular congestion is noted, with mildly increased interstitial markings, raising question for minimal interstitial edema. There is no evidence of pleural effusion or pneumothorax.  The cardiomediastinal silhouette is normal  in size. No acute osseous abnormalities are seen.  IMPRESSION: Vascular congestion, with mildly increased interstitial markings, raising question for minimal interstitial edema.   Electronically Signed   By: Roanna Raider M.D.   On: 05/04/2014 01:16    PHYSICAL EXAM General: Well developed, obese, in no acute distress. Head: Normocephalic, atraumatic, sclera non-icteric, oropharynx is clear Neck: Negative for carotid bruits. JVD not elevated. No adenopathy Lungs: Clear bilaterally to auscultation without wheezes, rales, or rhonchi. Breathing is unlabored. Heart: RRR S1 S2 without murmurs, rubs, or gallops.  Abdomen: Soft, non-tender, non-distended with normoactive bowel sounds. No hepatomegaly. No rebound/guarding. No obvious abdominal masses. Msk:  Strength and tone appears normal for age. Extremities: No clubbing, cyanosis or edema.  Distal pedal pulses are 2+ and equal bilaterally. Radial cath site without hematoma. Neuro: Alert and oriented X 3. Moves all extremities spontaneously. Psych:  Responds to questions appropriately with a normal affect.  ASSESSMENT AND PLAN: 1. Anterior STEMI. S/p DES  of LAD. On ASA and Brilinta for at least one year. Needs to avoid NSAIDs. On PPI. Troponin > 20. Elevated transaminases due to MI. Repeat LFTs pending this am.  EF 30-35% by cath and Echo.  Adjust medical therapy with beta blocker, ACEi, and statin. Enrolled in Complete trial and randomized to medical treatment arm.   2. Acute systolic CHF. EF 30-35%. Adjust meds as above. Lungs much clear. Will DC lasix and aldactone due to acute increase in creatinine.  Sodium restriction. Will need reassessment of LV function in 2-3 months  3. HTN. Now controlled.  Adjust meds as noted above.  4. Hypercholesterolemia. On high dose statin.   5. Glucose intolerance. A1c 5.8%. Dietary modification.  6. AKI. Creatinine 0.88>>0.90>>1.35. Will hold lasix and aldactone and monitor BMET closely.  7. Tobacco use. Patient reports infrequent smoking. Recommend cessation.  8. Hypokalemia- replete  Will transfer to telemetry today. Possible DC tomorrow if renal function improved. Patient is enrolled in the Complete trial and randomized to the medical treatment arm for her residual disease.   Present on Admission:  . ST elevation myocardial infarction (STEMI) of anterior wall, initial episode of care . ST elevation myocardial infarction (STEMI) of anterior wall . STEMI (ST elevation myocardial infarction)  Signed, Peter [place], MDFACC 05/05/2014 8:35 AM

## 2014-05-05 NOTE — Progress Notes (Signed)
Recent b/p 103/59 ,(114/55 (@ 0900 am) discussed w/Ronda Barrett ,PA.   Lisinopril 10 mg po held this am .

## 2014-05-05 NOTE — Progress Notes (Signed)
Info provided re pnuemonia vaccine . Pt considering the info .

## 2014-05-06 ENCOUNTER — Other Ambulatory Visit: Payer: Self-pay | Admitting: Physician Assistant

## 2014-05-06 DIAGNOSIS — N179 Acute kidney failure, unspecified: Secondary | ICD-10-CM | POA: Diagnosis not present

## 2014-05-06 DIAGNOSIS — Z720 Tobacco use: Secondary | ICD-10-CM | POA: Diagnosis present

## 2014-05-06 DIAGNOSIS — I5021 Acute systolic (congestive) heart failure: Secondary | ICD-10-CM | POA: Diagnosis present

## 2014-05-06 DIAGNOSIS — R7989 Other specified abnormal findings of blood chemistry: Secondary | ICD-10-CM | POA: Diagnosis present

## 2014-05-06 DIAGNOSIS — R945 Abnormal results of liver function studies: Secondary | ICD-10-CM

## 2014-05-06 DIAGNOSIS — I255 Ischemic cardiomyopathy: Secondary | ICD-10-CM | POA: Diagnosis present

## 2014-05-06 DIAGNOSIS — E785 Hyperlipidemia, unspecified: Secondary | ICD-10-CM | POA: Diagnosis not present

## 2014-05-06 LAB — BASIC METABOLIC PANEL
BUN: 28 mg/dL — ABNORMAL HIGH (ref 6–23)
CO2: 23 mEq/L (ref 19–32)
Calcium: 9.8 mg/dL (ref 8.4–10.5)
Chloride: 101 mEq/L (ref 96–112)
Creatinine, Ser: 1.42 mg/dL — ABNORMAL HIGH (ref 0.50–1.10)
GFR calc Af Amer: 45 mL/min — ABNORMAL LOW (ref >90)
GFR calc non Af Amer: 39 mL/min — ABNORMAL LOW (ref >90)
Glucose, Bld: 102 mg/dL — ABNORMAL HIGH (ref 70–99)
Potassium: 3.8 mEq/L (ref 3.7–5.3)
Sodium: 139 mEq/L (ref 137–147)

## 2014-05-06 MED ORDER — LISINOPRIL 2.5 MG PO TABS
2.5000 mg | ORAL_TABLET | Freq: Every day | ORAL | Status: DC
  Filled 2014-05-06: qty 1

## 2014-05-06 MED ORDER — ASPIRIN 81 MG PO CHEW: 81.0000 mg | CHEWABLE_TABLET | Freq: Every day | ORAL | Status: DC

## 2014-05-06 MED ORDER — NITROGLYCERIN 0.4 MG SL SUBL: 0.4000 mg | SUBLINGUAL_TABLET | SUBLINGUAL | Status: DC | PRN

## 2014-05-06 MED ORDER — LISINOPRIL 2.5 MG PO TABS
2.5000 mg | ORAL_TABLET | Freq: Every day | ORAL | Status: DC
Start: 2014-05-06 — End: 2014-05-23

## 2014-05-06 MED ORDER — CARVEDILOL 6.25 MG PO TABS: 6.2500 mg | ORAL_TABLET | Freq: Two times a day (BID) | ORAL | Status: DC

## 2014-05-06 MED ORDER — ATORVASTATIN CALCIUM 80 MG PO TABS: 80.0000 mg | ORAL_TABLET | Freq: Every day | ORAL | Status: DC

## 2014-05-06 MED ORDER — TICAGRELOR 90 MG PO TABS: 90.0000 mg | ORAL_TABLET | Freq: Two times a day (BID) | ORAL | Status: DC

## 2014-05-06 NOTE — Progress Notes (Signed)
    SUBJECTIVE:  No pain.  No SOB.   PHYSICAL EXAM Filed Vitals:   05/05/14 1500 05/05/14 2010 05/06/14 0052 05/06/14 0443  BP: 110/73 141/76 132/65 127/55  Pulse: 91 92 86 87  Temp: 98.7 F (37.1 C) 98.5 F (36.9 C) 97.7 F (36.5 C) 98.2 F (36.8 C)  TempSrc: Oral Oral Oral Oral  Resp: 18 19 19 18   Height:      Weight:    208 lb 14.4 oz (94.756 kg)  SpO2: 100% 100% 99% 100%   General:  No distress Lungs:  Clear Heart:  RRR Abdomen:  Positive bowel sounds, no rebound no guarding Extremities:  No edema  LABS:  Results for orders placed during the hospital encounter of 05/03/14 (from the past 24 hour(s))  BASIC METABOLIC PANEL     Status: Abnormal   Collection Time    05/06/14  3:42 AM      Result Value Ref Range   Sodium 139  137 - 147 mEq/L   Potassium 3.8  3.7 - 5.3 mEq/L   Chloride 101  96 - 112 mEq/L   CO2 23  19 - 32 mEq/L   Glucose, Bld 102 (*) 70 - 99 mg/dL   BUN 28 (*) 6 - 23 mg/dL   Creatinine, Ser 05/08/14 (*) 0.50 - 1.10 mg/dL   Calcium 9.8  8.4 - 3.00 mg/dL   GFR calc non Af Amer 39 (*) >90 mL/min   GFR calc Af Amer 45 (*) >90 mL/min    Intake/Output Summary (Last 24 hours) at 05/06/14 0815 Last data filed at 05/06/14 0700  Gross per 24 hour  Intake    840 ml  Output   1700 ml  Net   -860 ml    TELEMETRY:    No arrhythmia. Reviewed today.   ASSESSMENT AND PLAN:  ANTERIOR MI:  DES of LAD. On ASA and Brilinta for at least one year.   Continue to hold Lasix and aldactone.   I would send her home on 2.5 mg lisinopril.    TOBACCO ABUSE:  Educated.   AKI:  Creat continues to rise.  However, it seems to be rising very slowly.  I think that this could be checked with a BMET Tuesday at home.    HTN:  Controlled.   OK to discharge with TOC appt within seven days.     Friday [facility] 05/06/2014 8:15 AM

## 2014-05-06 NOTE — Care Management Note (Signed)
    Page 1 of 1   05/06/2014     4:53:16 PM CARE MANAGEMENT NOTE 05/06/2014  Patient:  [facility]   Account Number:  [id number]  Date Initiated:  05/06/2014  Documentation initiated by:  [facility]  Subjective/Objective Assessment:   adm: STEMI     Action/Plan:   discharge planning   Anticipated DC Date:  05/06/2014   Anticipated DC Plan:  HOME/SELF CARE      DC Planning Services  CM consult      Choice offered to / List presented to:             Status of service:  Completed, signed off Medicare Important Message given?   (If response is "NO", the following Medicare IM given date fields will be blank) Date Medicare IM given:   Date Additional Medicare IM given:    Discharge Disposition:  HOME/SELF CARE  Per UR Regulation:    If discussed at Long Length of Stay Meetings, dates discussed:    Comments:  05/06/14 16:20 CM received call from RN who states she sent pt home without Brilinta card, had the pt on the phone and could I do something about it.  CM spoke with pt who states she uses [facility] on [address].  CM activated brilinta card for pt.  CM called pharmacist and explained I would be faxing an activated card over for [facility] who would pick up her prescriptions later today.  CM faxed activated card and pt info to [phone].  CM called pt and informed her the Brinlinta card was faxed and the pharmacy was expecting her later this afternoon.  Though not listed on facesheet, pt has [employer] and therefore not eligible for [employer].  No other Cm needs were communicated. Freddy Jaksch, BSn, CM [phone].

## 2014-05-06 NOTE — Discharge Summary (Signed)
Physician Discharge Summary     Cardiologist:  Jordan(New)  Patient ID: Deborah Ho MRN: [id number] DOB/AGE: 1951/07/16 63 y.o.  Admit date: 05/03/2014 Discharge date: 05/06/2014  Admission Diagnoses: STEMI  Discharge Diagnoses:  Active Problems:   ST elevation myocardial infarction (STEMI) of anterior wall   HLD (hyperlipidemia)   Tobacco abuse   Acute kidney injury   HTN (hypertension)   Cardiomyopathy, ischemic, EF 30-35 % Echo 05/04/14.   Elevated LFTs   Acute systolic CHF (congestive heart failure)   hyperglycemia  Discharged Condition: stable  Hospital Course:   Deborah Ho is a 63 y.o. female with no history of CAD. She has not had a checkup in over a year, but denies any history of hypertension, hyperlipidemia or diabetes. She smokes a few cigarettes a day and walks regularly, but does not stick tightly to a hard-healthy diet.   Today she was in her usual state of health when she had sudden onset of nausea and diaphoresis. She thought she had a GI illness after eating sausage for lunch. However, she developed chest pain that reached a 10/10, and was significantly diaphoretic. She tried to drive to the emergency room but had trouble doing this because her pain was so severe. Her symptoms began approximately 5 PM today. She has never had symptoms like this before. She has no history of exertional shortness of breath or chest pain.  In the emergency room, her ECG was consistent with an anterior STEMI. She was also hypertensive with a systolic blood pressure greater than 170. The Cath Lab was activated. She was given aspirin 324 mg, sublingual nitroglycerin x1, heparin 4000 units, and metoprolol 5 mg IV. Her ECG is improved but she continued to experience severe substernal chest pain. She was taken directly to the cath lab.   Coronary angiography revealed severe three vessel disease and a culprit lesion in the proximal LAD, severe LV dysfunction.  She underwent successful  stenting of the proximal LAD with a DES.  ASA and Brilinta for one year.  She was enrolled in the COMPLETE trial for her residual disease.  Echocardiogram confirms LV dysfunction-EF30-35%, akinesis of the anteroseptal and apical myocardium.  She had acute sys CHF and was treated with IV lasix and switched to PO then discontinued due to worsening kidney function.  Elevated LFTs trended back down.  The patient was seen by Dr. Antoine Poche who felt she was stable for DC home.  We will continue to hold lasix and aldactone.  Low dose ACE added.  Follow up BMET on May 08, 2014.  Cardiac rehab following.      Consults: Cardiac Rehab  Significant Diagnostic Studies: Procedure: Left Heart Cath, Selective Coronary Angiography, LV angiography, PTCA and stenting of the proximal LAD  Indication: 63 yo BF with an acute anterior STEMI  Procedural Details: The right wrist was prepped, draped, and anesthetized with 1% lidocaine. Using the modified Seldinger technique, a 6 French slender sheath was introduced into the right radial artery. 3 mg of verapamil was administered through the sheath, weight-based unfractionated heparin was administered intravenously. Standard Judkins catheters were used for selective coronary angiography and left ventriculography. Catheter exchanges were performed over an exchange length guidewire.  PROCEDURAL FINDINGS  Hemodynamics:  AO 162/105 mean 130 mm Hg  LV 158/26 mm Hg  Coronary angiography:  Coronary dominance: right  Left mainstem: Normal  Left anterior descending (LAD): 95% proximal LAD stenosis with TIMI 2 flow.  Ramus intermediate: moderate sized vessel. Mild disease less than 20%.  Left circumflex (LCx): The LCx has 30% proximal disease. The LCx trifurcates into 3 OM branches. After the first branch there is a 60-70% stenosis in the OM prior to the next bifurcation.  Right coronary artery (RCA): The RCA arises low in the coronary cusp and was engaged with a Williams right  catheter. This demonstrates a long segment of disease with 70-80% disease proximally tapering to 80-90% in the mid vessel.  Left ventriculography: Left ventricular systolic function is abnormal, LVEF is estimated at 35%, there is severe anterior hypokinesis with akinesis of the anterior apex and inferior apex. There is mild mitral regurgitation  PCI Note: Following the diagnostic procedure, the decision was made to proceed with PCI. Weight-based bivalirudin was given for anticoagulation. Brilinta 180 mg was given orally. Once a therapeutic ACT was achieved, a 6 [place] XBLAD 3.5 guide catheter was inserted. A prowater coronary guidewire was used to cross the lesion. The lesion was predilated with a 2.0 mm balloon. The lesion was then stented with a 3.0 x 23 mm Xience Alpine stent. The stent was postdilated with a 3.25 mm noncompliant balloon. Following PCI, there was 0% residual stenosis and TIMI-3 flow. Final angiography confirmed an excellent result. The patient tolerated the procedure well. There were no immediate procedural complications. A TR band was used for radial hemostasis. The patient was transferred to the post catheterization recovery area for further monitoring.  PCI Data:  Vessel - LAD/Segment - proximal  Percent Stenosis (pre) 95%  TIMI-flow 2  Stent 3.0 x 23 mm Xience Alpine  Percent Stenosis (post) 0%  TIMI-flow (post) 3  Final Conclusions:  1. Severe 3 vessel obstructive CAD. Culprit lesion in the proximal LAD.  2. Severe LV dysfunction.  3. Successful stenting of the proximal LAD with a DES.  Recommendations:  Continue DAPT for one year. IV diuresis. BP control. May consider patient for enrollment in Complete trial for her residual disease.  Peter [place], MDFACC  05/03/2014, 8:17 PM     Echocardiogram, Study Conclusions  - Left ventricle: The cavity size was normal. There was mild focal basal hypertrophy of the septum. Systolic function was moderately to severely reduced.  The estimated ejection fraction was in the range of 30% to 35%. There is akinesis of the anteroseptal and apical myocardium. Doppler parameters are consistent with abnormal left ventricular relaxation (grade 1 diastolic dysfunction).  Impressions: - Technically difficult; definity used; anteroseptal and apical akinesis with overall severely reduced LV function.   Treatments:  See above  Discharge Exam: Blood pressure 127/55, pulse 87, temperature 98.2 F (36.8 C), temperature source Oral, resp. rate 18, height 5\' 5"  (1.651 m), weight 208 lb 14.4 oz (94.756 kg), SpO2 100.00%.   Disposition: Final discharge disposition not confirmed      Discharge Instructions   Amb Referral to Cardiac Rehabilitation    Complete by:  As directed      Diet - low sodium heart healthy    Complete by:  As directed      Discharge instructions    Complete by:  As directed   Monitor weight every morning.  If you gain 2 pounds in 24 hours of 5 pounds in a week, call our office([phone]) for instructions on taking additional medication.     Increase activity slowly    Complete by:  As directed             Medication List         aspirin 81 MG chewable tablet  Chew 1  tablet (81 mg total) by mouth daily.     atorvastatin 80 MG tablet  Commonly known as:  LIPITOR  Take 1 tablet (80 mg total) by mouth daily at 6 PM.     carvedilol 6.25 MG tablet  Commonly known as:  COREG  Take 1 tablet (6.25 mg total) by mouth 2 (two) times daily with a meal.     cetirizine 10 MG tablet  Commonly known as:  ZYRTEC  Take 10 mg by mouth daily as needed for allergies.     lisinopril 2.5 MG tablet  Commonly known as:  PRINIVIL,ZESTRIL  Take 1 tablet (2.5 mg total) by mouth daily.     nitroGLYCERIN 0.4 MG SL tablet  Commonly known as:  NITROSTAT  Place 1 tablet (0.4 mg total) under the tongue every 5 (five) minutes x 3 doses as needed for chest pain.     ticagrelor 90 MG Tabs tablet  Commonly known as:   BRILINTA  Take 1 tablet (90 mg total) by mouth 2 (two) times daily.       Follow-up Information   Follow up with Peter [place], MD. (The office will call you with the follow up appt date and time.)    Specialty:  Cardiology   Contact information:   [address] [place] [address] [phone]       Follow up with LABS On 05/08/2014. (A "Basic Metabolic Panel" has been ordered.   It can be done at the address listed or any lab.)    Contact information:   [address] [place] [address] [phone]     Greater than 30 minutes was spent completing the patient's discharge.    SignedWilburt Finlay, PAC 05/06/2014, 9:20 AM  Patient seen and examined.  Plan as discussed in my rounding note for today and outlined above. Fayrene Fearing [facility]  05/06/2014  9:26 AM

## 2014-05-08 ENCOUNTER — Telehealth: Payer: Self-pay | Admitting: Cardiology

## 2014-05-08 ENCOUNTER — Ambulatory Visit (INDEPENDENT_AMBULATORY_CARE_PROVIDER_SITE_OTHER): Payer: No Typology Code available for payment source | Admitting: *Deleted

## 2014-05-08 DIAGNOSIS — N179 Acute kidney failure, unspecified: Secondary | ICD-10-CM

## 2014-05-08 LAB — BASIC METABOLIC PANEL
BUN: 15 mg/dL (ref 6–23)
CO2: 29 mEq/L (ref 19–32)
Calcium: 9 mg/dL (ref 8.4–10.5)
Chloride: 108 mEq/L (ref 96–112)
Creatinine, Ser: 1.2 mg/dL (ref 0.4–1.2)
GFR: 60.14 mL/min (ref >60.00)
Glucose, Bld: 109 mg/dL — ABNORMAL HIGH (ref 70–99)
Potassium: 3.6 mEq/L (ref 3.5–5.1)
Sodium: 142 mEq/L (ref 135–145)

## 2014-05-08 NOTE — Telephone Encounter (Signed)
Patient walked in office she stated voucher for brilanta expired.Brilanta 90 mg samples given to patient.Post hospital appointment scheduled with Dr.Jordan 05/23/14 at the [facility] office.Patient wanted to know if she can return to work Monday 05/14/14.Message sent to Dr.Jordan.

## 2014-05-08 NOTE — Telephone Encounter (Signed)
Pt would like a letter for release to go back to work as well as dates for admission and discharge of hospital stay.

## 2014-05-09 ENCOUNTER — Encounter (HOSPITAL_COMMUNITY)
Admission: EM | Disposition: A | Discharge status: Home / Self Care | Payer: Self-pay | Source: Home / Self Care | Attending: Cardiology

## 2014-05-09 ENCOUNTER — Encounter (HOSPITAL_COMMUNITY): Payer: Self-pay | Admitting: Emergency Medicine

## 2014-05-09 ENCOUNTER — Inpatient Hospital Stay (HOSPITAL_COMMUNITY)
Admission: EM | Admit: 2014-05-09 | Discharge: 2014-05-12 | DRG: 247 | Disposition: A | Discharge status: Home / Self Care | Payer: No Typology Code available for payment source | Attending: Cardiology | Admitting: Cardiology

## 2014-05-09 DIAGNOSIS — I2109 ST elevation (STEMI) myocardial infarction involving other coronary artery of anterior wall: Principal | ICD-10-CM | POA: Diagnosis present

## 2014-05-09 DIAGNOSIS — I252 Old myocardial infarction: Secondary | ICD-10-CM | POA: Diagnosis present

## 2014-05-09 DIAGNOSIS — Z9089 Acquired absence of other organs: Secondary | ICD-10-CM

## 2014-05-09 DIAGNOSIS — Z7902 Long term (current) use of antithrombotics/antiplatelets: Secondary | ICD-10-CM

## 2014-05-09 DIAGNOSIS — I25110 Atherosclerotic heart disease of native coronary artery with unstable angina pectoris: Secondary | ICD-10-CM

## 2014-05-09 DIAGNOSIS — Z79899 Other long term (current) drug therapy: Secondary | ICD-10-CM

## 2014-05-09 DIAGNOSIS — I255 Ischemic cardiomyopathy: Secondary | ICD-10-CM

## 2014-05-09 DIAGNOSIS — I2510 Atherosclerotic heart disease of native coronary artery without angina pectoris: Secondary | ICD-10-CM | POA: Diagnosis present

## 2014-05-09 DIAGNOSIS — I10 Essential (primary) hypertension: Secondary | ICD-10-CM | POA: Diagnosis present

## 2014-05-09 DIAGNOSIS — T82897A Other specified complication of cardiac prosthetic devices, implants and grafts, initial encounter: Secondary | ICD-10-CM | POA: Diagnosis present

## 2014-05-09 DIAGNOSIS — I2589 Other forms of chronic ischemic heart disease: Secondary | ICD-10-CM | POA: Diagnosis present

## 2014-05-09 DIAGNOSIS — Z720 Tobacco use: Secondary | ICD-10-CM

## 2014-05-09 DIAGNOSIS — Y831 Surgical operation with implant of artificial internal device as the cause of abnormal reaction of the patient, or of later complication, without mention of misadventure at the time of the procedure: Secondary | ICD-10-CM | POA: Diagnosis present

## 2014-05-09 DIAGNOSIS — E785 Hyperlipidemia, unspecified: Secondary | ICD-10-CM | POA: Diagnosis present

## 2014-05-09 DIAGNOSIS — Z6835 Body mass index (BMI) 35.0-35.9, adult: Secondary | ICD-10-CM

## 2014-05-09 DIAGNOSIS — E669 Obesity, unspecified: Secondary | ICD-10-CM | POA: Diagnosis present

## 2014-05-09 DIAGNOSIS — F17200 Nicotine dependence, unspecified, uncomplicated: Secondary | ICD-10-CM | POA: Diagnosis present

## 2014-05-09 DIAGNOSIS — K219 Gastro-esophageal reflux disease without esophagitis: Secondary | ICD-10-CM | POA: Diagnosis present

## 2014-05-09 DIAGNOSIS — Z7982 Long term (current) use of aspirin: Secondary | ICD-10-CM

## 2014-05-09 HISTORY — PX: CORONARY ANGIOPLASTY WITH STENT PLACEMENT: SHX49

## 2014-05-09 HISTORY — PX: LEFT HEART CATHETERIZATION WITH CORONARY ANGIOGRAM: SHX5451

## 2014-05-09 LAB — DIFFERENTIAL
Basophils Absolute: 0 10*3/uL (ref 0.0–0.1)
Basophils Relative: 0 % (ref 0–1)
Eosinophils Absolute: 0.1 10*3/uL (ref 0.0–0.7)
Eosinophils Relative: 1 % (ref 0–5)
Lymphocytes Relative: 15 % (ref 12–46)
Lymphs Abs: 1.7 10*3/uL (ref 0.7–4.0)
Monocytes Absolute: 0.4 10*3/uL (ref 0.1–1.0)
Monocytes Relative: 3 % (ref 3–12)
Neutro Abs: 9 10*3/uL — ABNORMAL HIGH (ref 1.7–7.7)
Neutrophils Relative %: 81 % — ABNORMAL HIGH (ref 43–77)

## 2014-05-09 LAB — BASIC METABOLIC PANEL
BUN: 14 mg/dL (ref 6–23)
CO2: 23 mEq/L (ref 19–32)
Calcium: 10.4 mg/dL (ref 8.4–10.5)
Chloride: 106 mEq/L (ref 96–112)
Creatinine, Ser: 0.98 mg/dL (ref 0.50–1.10)
GFR calc Af Amer: 70 mL/min — ABNORMAL LOW (ref >90)
GFR calc non Af Amer: 61 mL/min — ABNORMAL LOW (ref >90)
Glucose, Bld: 135 mg/dL — ABNORMAL HIGH (ref 70–99)
Potassium: 4 mEq/L (ref 3.7–5.3)
Sodium: 147 mEq/L (ref 137–147)

## 2014-05-09 LAB — CBC
HCT: 42.1 % (ref 36.0–46.0)
Hemoglobin: 13.9 g/dL (ref 12.0–15.0)
MCH: 29.6 pg (ref 26.0–34.0)
MCHC: 33 g/dL (ref 30.0–36.0)
MCV: 89.6 fL (ref 78.0–100.0)
Platelets: 357 10*3/uL (ref 150–400)
RBC: 4.7 MIL/uL (ref 3.87–5.11)
RDW: 13.2 % (ref 11.5–15.5)
WBC: 11.1 10*3/uL — ABNORMAL HIGH (ref 4.0–10.5)

## 2014-05-09 LAB — TROPONIN I
Troponin I: >20 ng/mL (ref <0.30)
Troponin I: >20 ng/mL (ref <0.30)

## 2014-05-09 LAB — POCT ACTIVATED CLOTTING TIME
Activated Clotting Time: 242 seconds
Activated Clotting Time: 315 seconds

## 2014-05-09 LAB — PLATELET COUNT: Platelets: 366 10*3/uL (ref 150–400)

## 2014-05-09 LAB — PROTIME-INR
INR: 0.93 (ref 0.00–1.49)
Prothrombin Time: 12.5 seconds (ref 11.6–15.2)

## 2014-05-09 LAB — APTT: aPTT: 25 seconds (ref 24–37)

## 2014-05-09 SURGERY — LEFT HEART CATHETERIZATION WITH CORONARY ANGIOGRAM
Anesthesia: LOCAL

## 2014-05-09 MED ORDER — NITROGLYCERIN IN D5W 200-5 MCG/ML-% IV SOLN
2.0000 ug/min | INTRAVENOUS | Status: DC
  Administered 2014-05-09: 40 ug/min via INTRAVENOUS
  Filled 2014-05-09: qty 250

## 2014-05-09 MED ORDER — PRASUGREL HCL 10 MG PO TABS
ORAL_TABLET | ORAL | Status: AC
  Filled 2014-05-09: qty 6

## 2014-05-09 MED ORDER — ASPIRIN EC 81 MG PO TBEC: 81.0000 mg | DELAYED_RELEASE_TABLET | Freq: Every day | ORAL | Status: DC

## 2014-05-09 MED ORDER — VERAPAMIL HCL 2.5 MG/ML IV SOLN
INTRAVENOUS | Status: AC
  Filled 2014-05-09: qty 2

## 2014-05-09 MED ORDER — FENTANYL CITRATE 0.05 MG/ML IJ SOLN
INTRAMUSCULAR | Status: AC
  Filled 2014-05-09: qty 2

## 2014-05-09 MED ORDER — ONDANSETRON HCL 4 MG/2ML IJ SOLN
INTRAMUSCULAR | Status: AC
  Filled 2014-05-09: qty 2

## 2014-05-09 MED ORDER — PRASUGREL HCL 10 MG PO TABS
10.0000 mg | ORAL_TABLET | Freq: Every day | ORAL | Status: DC
  Administered 2014-05-10 – 2014-05-12 (×3): 10 mg via ORAL
  Filled 2014-05-09 (×3): qty 1

## 2014-05-09 MED ORDER — HEPARIN (PORCINE) IN NACL 2-0.9 UNIT/ML-% IJ SOLN
INTRAMUSCULAR | Status: AC
  Filled 2014-05-09: qty 1500

## 2014-05-09 MED ORDER — HEPARIN SODIUM (PORCINE) 1000 UNIT/ML IJ SOLN
INTRAMUSCULAR | Status: AC
  Filled 2014-05-09: qty 1

## 2014-05-09 MED ORDER — ONDANSETRON HCL 4 MG/2ML IJ SOLN: 4.0000 mg | Freq: Four times a day (QID) | INTRAMUSCULAR | Status: DC | PRN

## 2014-05-09 MED ORDER — NITROGLYCERIN 0.2 MG/ML ON CALL CATH LAB
INTRAVENOUS | Status: AC
  Filled 2014-05-09: qty 1

## 2014-05-09 MED ORDER — LORATADINE 10 MG PO TABS
10.0000 mg | ORAL_TABLET | Freq: Every day | ORAL | Status: DC
Start: 2014-05-09 — End: 2014-05-12
  Administered 2014-05-09 – 2014-05-12 (×4): 10 mg via ORAL
  Filled 2014-05-09 (×4): qty 1

## 2014-05-09 MED ORDER — HEPARIN SODIUM (PORCINE) 5000 UNIT/ML IJ SOLN
60.0000 [IU]/kg | Freq: Once | INTRAMUSCULAR | Status: DC
  Administered 2014-05-09: 4000 [IU] via INTRAVENOUS

## 2014-05-09 MED ORDER — LISINOPRIL 2.5 MG PO TABS
2.5000 mg | ORAL_TABLET | Freq: Every day | ORAL | Status: DC
  Administered 2014-05-09 – 2014-05-12 (×4): 2.5 mg via ORAL
  Filled 2014-05-09 (×4): qty 1

## 2014-05-09 MED ORDER — MIDAZOLAM HCL 2 MG/2ML IJ SOLN
INTRAMUSCULAR | Status: AC
  Filled 2014-05-09: qty 2

## 2014-05-09 MED ORDER — TIROFIBAN HCL IV 5 MG/100ML
0.1500 ug/kg/min | INTRAVENOUS | Status: AC
  Administered 2014-05-09 (×2): 0.15 ug/kg/min via INTRAVENOUS
  Filled 2014-05-09 (×3): qty 100

## 2014-05-09 MED ORDER — ONDANSETRON HCL 4 MG/2ML IJ SOLN
4.0000 mg | Freq: Four times a day (QID) | INTRAMUSCULAR | Status: DC | PRN
  Administered 2014-05-09: 4 mg via INTRAVENOUS
  Filled 2014-05-09: qty 2

## 2014-05-09 MED ORDER — SODIUM CHLORIDE 0.9 % IV SOLN: 1.0000 mL/kg/h | INTRAVENOUS | Status: AC

## 2014-05-09 MED ORDER — ACETAMINOPHEN 325 MG PO TABS: 650.0000 mg | ORAL_TABLET | ORAL | Status: DC | PRN

## 2014-05-09 MED ORDER — HEPARIN SODIUM (PORCINE) 5000 UNIT/ML IJ SOLN
INTRAMUSCULAR | Status: AC
  Filled 2014-05-09: qty 1

## 2014-05-09 MED ORDER — NITROGLYCERIN 0.4 MG SL SUBL: 0.4000 mg | SUBLINGUAL_TABLET | SUBLINGUAL | Status: DC | PRN

## 2014-05-09 MED ORDER — ATORVASTATIN CALCIUM 80 MG PO TABS
80.0000 mg | ORAL_TABLET | Freq: Every day | ORAL | Status: DC
  Administered 2014-05-09 – 2014-05-12 (×4): 80 mg via ORAL
  Filled 2014-05-09 (×4): qty 1

## 2014-05-09 MED ORDER — ASPIRIN 81 MG PO CHEW
324.0000 mg | CHEWABLE_TABLET | Freq: Once | ORAL | Status: AC
  Administered 2014-05-09: 324 mg via ORAL

## 2014-05-09 MED ORDER — NITROGLYCERIN IN D5W 200-5 MCG/ML-% IV SOLN
INTRAVENOUS | Status: AC
  Filled 2014-05-09: qty 250

## 2014-05-09 MED ORDER — ASPIRIN 81 MG PO CHEW
CHEWABLE_TABLET | ORAL | Status: AC
  Filled 2014-05-09: qty 4

## 2014-05-09 MED ORDER — PRASUGREL HCL 10 MG PO TABS
ORAL_TABLET | ORAL | Status: AC
Start: 2014-05-09 — End: 2014-05-09
  Filled 2014-05-09: qty 6

## 2014-05-09 MED ORDER — LIDOCAINE HCL (PF) 1 % IJ SOLN
INTRAMUSCULAR | Status: AC
  Filled 2014-05-09: qty 30

## 2014-05-09 MED ORDER — HYDROMORPHONE HCL PF 1 MG/ML IJ SOLN
1.0000 mg | INTRAMUSCULAR | Status: DC | PRN
  Administered 2014-05-09 (×2): 1 mg via INTRAVENOUS
  Filled 2014-05-09 (×2): qty 1

## 2014-05-09 MED ORDER — CARVEDILOL 6.25 MG PO TABS
6.2500 mg | ORAL_TABLET | Freq: Two times a day (BID) | ORAL | Status: DC
  Administered 2014-05-09 – 2014-05-11 (×5): 6.25 mg via ORAL
  Filled 2014-05-09 (×8): qty 1

## 2014-05-09 MED ORDER — ASPIRIN 81 MG PO CHEW
81.0000 mg | CHEWABLE_TABLET | Freq: Every day | ORAL | Status: DC
  Administered 2014-05-10 – 2014-05-12 (×3): 81 mg via ORAL
  Filled 2014-05-09 (×3): qty 1

## 2014-05-09 MED ORDER — TIROFIBAN HCL IV 5 MG/100ML
INTRAVENOUS | Status: AC
  Filled 2014-05-09: qty 100

## 2014-05-09 NOTE — H&P (Signed)
Cardiologist:  [place]  Deborah Ho is an 63 y.o. female.   Chief Complaint:  Chest pain HPI:  63 yo female who was discharged on June 21 after suffering a STEMi to the LAD.  Coronary angiography revealed severe three vessel disease and a culprit lesion in the proximal LAD, severe LV dysfunction. She underwent successful stenting of the proximal LAD with a DES. ASA and Brilinta for one year. She was enrolled in the COMPLETE trial for her residual disease. Echocardiogram confirmed LV dysfunction-EF30-35%, akinesis of the anteroseptal and apical myocardium. She had acute sys CHF during that admission and was treated with IV lasix and switched to PO then discontinued due to worsening kidney function. Elevated LFTs trended back down.  We held lasix and aldactone. Low dose ACE added.  She presents with 15/10 CP since 0800hrs this morning with nausea and diaphoresis.  She sates she had a hard time getting ASA.  Not sure why.  She also missed one day of Brilinta.  No SOB, LEE, orthopnea.   Medications: Prior to Admission medications   Medication Sig Start Date End Date Taking? Authorizing Provider  aspirin 81 MG chewable tablet Chew 1 tablet (81 mg total) by mouth daily. 05/06/14   Wilburt Finlay, PA-C  atorvastatin (LIPITOR) 80 MG tablet Take 1 tablet (80 mg total) by mouth daily at 6 PM. 05/06/14   Wilburt Finlay, PA-C  carvedilol (COREG) 6.25 MG tablet Take 1 tablet (6.25 mg total) by mouth 2 (two) times daily with a meal. 05/06/14   Wilburt Finlay, PA-C  cetirizine (ZYRTEC) 10 MG tablet Take 10 mg by mouth daily as needed for allergies.    Historical Provider, MD  lisinopril (PRINIVIL,ZESTRIL) 2.5 MG tablet Take 1 tablet (2.5 mg total) by mouth daily. 05/06/14   Wilburt Finlay, PA-C  nitroGLYCERIN (NITROSTAT) 0.4 MG SL tablet Place 1 tablet (0.4 mg total) under the tongue every 5 (five) minutes x 3 doses as needed for chest pain. 05/06/14   Wilburt Finlay, PA-C  ticagrelor (BRILINTA) 90 MG TABS tablet Take 1 tablet  (90 mg total) by mouth 2 (two) times daily. 05/06/14   Wilburt Finlay, PA-C     Past Medical History  Diagnosis Date  . GERD (gastroesophageal reflux disease)     Probable    Past Surgical History  Procedure Laterality Date  . Partial hysterectomy    . Cholecystectomy      No family history on file. Social History:  reports that she has been smoking Cigarettes.  She has a 10 pack-year smoking history. She has never used smokeless tobacco. She reports that she does not drink alcohol or use illicit drugs.  Allergies: No Known Allergies   (Not in a hospital admission)  Results for orders placed in visit on 05/08/14 (from the past 48 hour(s))  BASIC METABOLIC PANEL     Status: Abnormal   Collection Time    05/08/14  2:05 PM      Result Value Ref Range   Sodium 142  135 - 145 mEq/L   Potassium 3.6  3.5 - 5.1 mEq/L   Chloride 108  96 - 112 mEq/L   CO2 29  19 - 32 mEq/L   Glucose, Bld 109 (*) 70 - 99 mg/dL   BUN 15  6 - 23 mg/dL   Creatinine, Ser 1.2  0.4 - 1.2 mg/dL   Calcium 9.0  8.4 - 91.4 mg/dL   GFR 78.29  >56.21 mL/min   No results found.  Review of  Systems  Constitutional: Positive for diaphoresis.  HENT: Negative for sore throat.   Respiratory: Negative for shortness of breath.   Cardiovascular: Positive for chest pain. Negative for orthopnea, leg swelling and PND.  Gastrointestinal: Positive for nausea.  Genitourinary: Negative for hematuria.    Pulse 99, height 5\' 5"  (1.651 m), weight 192 lb (87.091 kg), SpO2 100.00%. Physical Exam  Nursing note and vitals reviewed. Constitutional: She is oriented to person, place, and time. She appears well-developed.  Obese  HENT:  Head: Normocephalic and atraumatic.  Eyes: EOM are normal. Pupils are equal, round, and reactive to light. No scleral icterus.  Neck: Normal range of motion.  Cardiovascular: Normal rate, regular rhythm, S1 normal and S2 normal.   Pulses:      Radial pulses are 2+ on the right side, and 2+ on  the left side.  Respiratory: Effort normal and breath sounds normal. She has no wheezes.  Musculoskeletal: She exhibits no edema.  Neurological: She is alert and oriented to person, place, and time. She exhibits normal muscle tone.  Skin: Skin is warm and dry.  Psychiatric: She has a normal mood and affect.     Assessment/Plan Principal Problem:   ST elevation myocardial infarction (STEMI) of anterior wall Active Problems:   HLD (hyperlipidemia)   Tobacco abuse   HTN (hypertension)   Cardiomyopathy, ischemic, EF 30-35 % Echo 05/04/14.  Plan:  The patient was taken emergently to the cath lab.  05/06/14, PA-C 05/09/2014, 11:48 AM   I have examined the patient and reviewed assessment and plan and discussed with patient.  Agree with above as stated.  She clearly has ECG changes compared to her discharge ECG. She is actively having chest pain. Plan emergent cardiac cath. She has missed a dose of her Brilinta per her report. We will have to stress the importance of secondary prevention and taking her medicines regularly. Further plans based on the cardiac cath results.  VARANASI,JAYADEEP S.

## 2014-05-09 NOTE — ED Provider Notes (Signed)
CSN: [id number]     Arrival date & time 05/09/14  1114 History   None    Chief Complaint  Patient presents with  . Chest Pain     (Consider location/radiation/quality/duration/timing/severity/associated sxs/prior Treatment) HPI Comments: Presents to ER for evaluation of chest pain. Patient reports sudden onset of left-sided chest pain at 8 AM. She reports taking her Brilinta after the pain started and she had some improvement. The pain then came back and has become severe. Patient is reporting 10 out of 10, severe left-sided chest discomfort and pressure with associated nausea and diaphoresis.  She was discharged from the hospital 3 days ago after being treated for an acute STEMI which occurred on June 18.  Patient is a 63 y.o. female presenting with chest pain.  Chest Pain Associated symptoms: diaphoresis and nausea     Past Medical History  Diagnosis Date  . GERD (gastroesophageal reflux disease)     Probable   Past Surgical History  Procedure Laterality Date  . Partial hysterectomy    . Cholecystectomy     No family history on file. History  Substance Use Topics  . Smoking status: Current Every Day Smoker -- 0.25 packs/day for 40 years    Types: Cigarettes  . Smokeless tobacco: Never Used  . Alcohol Use: No   OB History   Grav Para Term Preterm Abortions TAB SAB Ect Mult Living                 Review of Systems  Constitutional: Positive for diaphoresis.  Cardiovascular: Positive for chest pain.  Gastrointestinal: Positive for nausea.  All other systems reviewed and are negative.     Allergies  Review of patient's allergies indicates no known allergies.  Home Medications   Prior to Admission medications   Medication Sig Start Date End Date Taking? Authorizing Provider  aspirin 81 MG chewable tablet Chew 1 tablet (81 mg total) by mouth daily. 05/06/14   Wilburt Finlay, PA-C  atorvastatin (LIPITOR) 80 MG tablet Take 1 tablet (80 mg total) by mouth daily at 6 PM.  05/06/14   Wilburt Finlay, PA-C  carvedilol (COREG) 6.25 MG tablet Take 1 tablet (6.25 mg total) by mouth 2 (two) times daily with a meal. 05/06/14   Wilburt Finlay, PA-C  cetirizine (ZYRTEC) 10 MG tablet Take 10 mg by mouth daily as needed for allergies.    Historical Provider, MD  lisinopril (PRINIVIL,ZESTRIL) 2.5 MG tablet Take 1 tablet (2.5 mg total) by mouth daily. 05/06/14   Wilburt Finlay, PA-C  nitroGLYCERIN (NITROSTAT) 0.4 MG SL tablet Place 1 tablet (0.4 mg total) under the tongue every 5 (five) minutes x 3 doses as needed for chest pain. 05/06/14   Wilburt Finlay, PA-C  ticagrelor (BRILINTA) 90 MG TABS tablet Take 1 tablet (90 mg total) by mouth 2 (two) times daily. 05/06/14   Wilburt Finlay, PA-C   Pulse 99  Ht 5\' 5"  (1.651 m)  Wt 192 lb (87.091 kg)  BMI 31.95 kg/m2  SpO2 100% Physical Exam  Constitutional: She is oriented to person, place, and time. She appears well-developed and well-nourished. No distress.  HENT:  Head: Normocephalic and atraumatic.  Right Ear: Hearing normal.  Left Ear: Hearing normal.  Nose: Nose normal.  Mouth/Throat: Oropharynx is clear and moist and mucous membranes are normal.  Eyes: Conjunctivae and EOM are normal. Pupils are equal, round, and reactive to light.  Neck: Normal range of motion. Neck supple.  Cardiovascular: Regular rhythm, S1 normal and S2 normal.  Exam reveals no gallop and no friction rub.   No murmur heard. Pulmonary/Chest: Effort normal and breath sounds normal. No respiratory distress. She exhibits no tenderness.  Abdominal: Soft. Normal appearance and bowel sounds are normal. There is no hepatosplenomegaly. There is no tenderness. There is no rebound, no guarding, no tenderness at McBurney's point and negative Murphy's sign. No hernia.  Musculoskeletal: Normal range of motion.  Neurological: She is alert and oriented to person, place, and time. She has normal strength. No cranial nerve deficit or sensory deficit. Coordination normal. GCS eye subscore  is 4. GCS verbal subscore is 5. GCS motor subscore is 6.  Skin: Skin is warm, dry and intact. No rash noted. No cyanosis.  Psychiatric: She has a normal mood and affect. Her speech is normal and behavior is normal. Thought content normal.    ED Course  Procedures (including critical care time) Labs Review Labs Reviewed  CBC  DIFFERENTIAL  PROTIME-INR  APTT  BASIC METABOLIC PANEL    Imaging Review No results found.   EKG Interpretation   Date/Time:  Wednesday May 09 2014 11:19:44 EDT Ventricular Rate:  95 PR Interval:  168 QRS Duration: 76 QT Interval:  358 QTC Calculation: 449 R Axis:   -4 Text Interpretation:  Normal sinus rhythm Low voltage QRS Anteroseptal  infarct , possibly acute Inferolateral injury pattern  ACUTE  MI   Abnormal ECG Confirmed by Karion Cudd  MD, Gabriell Daigneault ([id number])  on 05/09/2014 11:28:38 AM      MDM   Final diagnoses:  None   STEMI  Patient presents to ER with acute onset chest pain. EKG shows significant ST elevations in V1 through V5 with suggestion of elevations inferiorly as well. Patient is just treated for ST elevation MI on June 18 with a similar pattern. EKG today is actually more pronounced than it was with her initial presentation. Her records reveals that after her procedure, elevations had resolved, this is a new process. Patient was therefore called as a STEMI, brought directly to the cath lab.    Gilda Crease, MD 05/09/14 [phone]

## 2014-05-09 NOTE — Telephone Encounter (Signed)
She had a significant MI. I would not recommend returning to work until July 6.   Peter Swaziland MD, [facility]

## 2014-05-09 NOTE — CV Procedure (Addendum)
PROCEDURE:  Left heart catheterization with selective coronary angiography, IVUS of the LAD, aspiration thrombectomy of the LAD, PCI of LAD.  INDICATIONS:  Anterior ST elevation MI  The risks, benefits, and details of the procedure were explained to the patient.  The patient verbalized understanding and wanted to proceed.  Emergency consent was obtained.  PROCEDURE TECHNIQUE:  After Xylocaine anesthesia a 32F slender sheath was placed in the right radial artery with a single anterior needle wall stick.  IV heparin was given. Right coronary angiography was done using a Williams right guide catheter.  Left coronary angiography was done using a CLS 3.0 guide catheter. The intervention was performed. Please see below for details. Left heart catheterization was done using a pigtail catheter.  A TR band was used for hemostasis.   CONTRAST:  Total of 115 cc.  COMPLICATIONS:  None.    HEMODYNAMICS:  Aortic pressure was 145/92; LV pressure was 142/12; LVEDP 23.  There was no gradient between the left ventricle and aorta.    ANGIOGRAPHIC DATA:   The left main coronary artery is widely patent.  The left anterior descending artery is a large vessel proximally. The stents in the mid vessel is occluded. After thrombectomy, it was noted that there is residual thrombus in the stent and just past the stent, there is a moderate disease in the segment past the stent. The mid to distal LAD opened up to a large vessel. .  The left circumflex artery is a large vessel.  There is a large ramus vessel which has mild diffuse disease. Branches across the lateral wall. Circumflex vessel is large. There is moderate, proximal disease. The OM1 branch has a 60% stenosis.  The right coronary artery is a small caliber, diffusely diseased vessel. There is TIMI 3 flow. The posterior lateral artery and posterior descending artery are small but patent. He was difficult to selectively engage the RCA. Due to the fact that  this is a emergency procedure, we moved onto the intervention of the LAD.  LEFT VENTRICULOGRAM:  Left ventricular angiogram was not done.  LVEDP was 23 mmHg.  PCI NARRATIVE: IV heparin was given. ACT was used to check that the heparin was therapeutic. A pro-water wire was placed across the occlusion in the mid LAD stent. A priority 1 aspiration thrombectomy catheter was advanced. After one pass, TIMI-3 flow was restored. There was successful removal of significant thrombus. A second pass was made. The IVUS was performed. This revealed a well deployed proximal stent. There was significant disease in the segment just distal to the stent. A 3.0 x 20 balloon was used to predilate the entire disease segment. A 3.0 x 23 Xience drug-eluting stent was deployed, overlapping the distal edge of the previously placed stent. Both stents were postdilated with a 3.5 x 20 noncompliant balloon. There was an excellent angiographic result. Intracoronary nitroglycerin was given. The intravascular ultrasound was placed again. This revealed a well opposed stented segment with short overlap. The catheters were removed. IV tirofiban will be continued for 8 hours post procedure. The patient was loaded with 60 mg of prasugrel.  IMPRESSIONS:  1. Normal left main coronary artery. 2. Subacute stent thrombosis of the day stent in the mid left anterior descending artery.  Successful thrombectomy was performed with restoration of TIMI-3 flow. Intravascular ultrasound revealed a vessel diameter of about 3.5 mm. An overlapping stent was placed. This is a 3.0 x 23, overlapping the distal edge of the prior stent.  The entire stented segment was post dilated with a 3.5 x 20 noncompliant balloon. Intravascular ultrasound was performed again showing excellent stent apposition and large lumen diameter.. 3. Moderate disease in the left circumflex artery and its branches. 4. Severe disease in the right coronary artery. 5. Left ventricular  systolic function not assessed.  LVEDP 23 mmHg.  Marland Kitchen  RECOMMENDATION:  Continue dual antiplatelet therapy. IV tirofiban will be continued as well.  Continue medical therapy for RCA disease. We stressed the importance of taking her medications as prescribed.  Continue aggressive secondary prevention. She would benefit from cardiac rehabilitation. We'll watch in the ICU.  F/u with Dr. Swaziland.

## 2014-05-09 NOTE — ED Notes (Signed)
Pt reports sudden onset of left sided 10/10 chest pain described as sharp, dull. Reports diaphoresis and nausea. Denies SOB, dizziness, lightheadedness. Pt noted to be diaphoretic and anxious.

## 2014-05-09 NOTE — ED Notes (Signed)
CATH LAB READY. ON THE WAY

## 2014-05-09 NOTE — Telephone Encounter (Signed)
Returned call to patient no answer.LMTC. 

## 2014-05-09 NOTE — Care Management Note (Signed)
    Page 1 of 1   05/09/2014     1:52:07 PM CARE MANAGEMENT NOTE 05/09/2014  Patient:  [facility]   Account Number:  [id number]  Date Initiated:  05/09/2014  Documentation initiated by:  Junius Creamer  Subjective/Objective Assessment:   adm w mi     Action/Plan:   lives alone   Anticipated DC Date:     Anticipated DC Plan:        DC Planning Services  CM consult  Medication Assistance      Choice offered to / List presented to:             Status of service:   Medicare Important Message given?   (If response is "NO", the following Medicare IM given date fields will be blank) Date Medicare IM given:   Date Additional Medicare IM given:    Discharge Disposition:  HOME/SELF CARE  Per UR Regulation:  Reviewed for med. necessity/level of care/duration of stay  If discussed at Long Length of Stay Meetings, dates discussed:    Comments:  6/24 1350 debbie Rillie Riffel rn,bsn gave pt effient 30day free and copay assist card. md changed from brilinta to effient this adm.

## 2014-05-10 DIAGNOSIS — E785 Hyperlipidemia, unspecified: Secondary | ICD-10-CM

## 2014-05-10 DIAGNOSIS — I2510 Atherosclerotic heart disease of native coronary artery without angina pectoris: Secondary | ICD-10-CM

## 2014-05-10 DIAGNOSIS — I2109 ST elevation (STEMI) myocardial infarction involving other coronary artery of anterior wall: Secondary | ICD-10-CM

## 2014-05-10 DIAGNOSIS — F17200 Nicotine dependence, unspecified, uncomplicated: Secondary | ICD-10-CM

## 2014-05-10 DIAGNOSIS — I2589 Other forms of chronic ischemic heart disease: Secondary | ICD-10-CM

## 2014-05-10 DIAGNOSIS — I200 Unstable angina: Secondary | ICD-10-CM

## 2014-05-10 DIAGNOSIS — I10 Essential (primary) hypertension: Secondary | ICD-10-CM

## 2014-05-10 DIAGNOSIS — I219 Acute myocardial infarction, unspecified: Secondary | ICD-10-CM

## 2014-05-10 LAB — CBC
HCT: 37.5 % (ref 36.0–46.0)
Hemoglobin: 12.5 g/dL (ref 12.0–15.0)
MCH: 29.9 pg (ref 26.0–34.0)
MCHC: 33.3 g/dL (ref 30.0–36.0)
MCV: 89.7 fL (ref 78.0–100.0)
Platelets: 338 10*3/uL (ref 150–400)
RBC: 4.18 MIL/uL (ref 3.87–5.11)
RDW: 13.4 % (ref 11.5–15.5)
WBC: 16.7 10*3/uL — ABNORMAL HIGH (ref 4.0–10.5)

## 2014-05-10 LAB — BASIC METABOLIC PANEL
BUN: 12 mg/dL (ref 6–23)
CO2: 22 mEq/L (ref 19–32)
Calcium: 9.1 mg/dL (ref 8.4–10.5)
Chloride: 102 mEq/L (ref 96–112)
Creatinine, Ser: 0.81 mg/dL (ref 0.50–1.10)
GFR calc Af Amer: 88 mL/min — ABNORMAL LOW (ref >90)
GFR calc non Af Amer: 76 mL/min — ABNORMAL LOW (ref >90)
Glucose, Bld: 123 mg/dL — ABNORMAL HIGH (ref 70–99)
Potassium: 4 mEq/L (ref 3.7–5.3)
Sodium: 140 mEq/L (ref 137–147)

## 2014-05-10 LAB — TROPONIN I: Troponin I: >20 ng/mL (ref <0.30)

## 2014-05-10 NOTE — Telephone Encounter (Signed)
Returned call to patient no answer.LMTC. 

## 2014-05-10 NOTE — Progress Notes (Signed)
EKG CRITICAL VALUE     12 lead EKG performed.  Critical value noted. Shearon Balo, RN notified.   Floyed Masoud H, CCT 05/10/2014 7:03 AM

## 2014-05-10 NOTE — Progress Notes (Signed)
CARDIAC REHAB PHASE I   PRE:  Rate/Rhythm: 98 SR    BP: sitting 130/90    SaO2:   MODE:  Ambulation: 350 ft   POST:  Rate/Rhythm: 104 ST with PVC    BP: sitting 131/90     SaO2:   Tolerated walk fine. BP elevated. Discussed MI, stent, Effient. Pt has many questions but seems to have a different thought process at times. 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 Sts quitting will not be a problem. Also discuss diet. Will continue to follow. [id number]   Elissa Lovett [place] CES, ACSM 05/10/2014 12:09 PM

## 2014-05-10 NOTE — Progress Notes (Signed)
Echo Lab  2D Echocardiogram completed.  Erandy Mceachern L Rikki Smestad, RDCS 05/10/2014 9:14 AM

## 2014-05-10 NOTE — Progress Notes (Signed)
SUBJECTIVE: No chest pain or SOB  BP 140/81  Pulse 103  Temp(Src) 98.5 F (36.9 C) (Oral)  Resp 23  Ht 5\' 5"  (1.651 m)  Wt 211 lb 6.7 oz (95.9 kg)  BMI 35.18 kg/m2  SpO2 99%  Intake/Output Summary (Last 24 hours) at 05/10/14 0720 Last data filed at 05/10/14 0700  Gross per 24 hour  Intake    754 ml  Output   1150 ml  Net   -396 ml    PHYSICAL EXAM General: Well developed, well nourished, in no acute distress. Alert and oriented x 3.  Psych:  Good affect, responds appropriately Neck: No JVD. No masses noted.  Lungs: Clear bilaterally with no wheezes or rhonci noted.  Heart: RRR with no murmurs noted. Abdomen: Bowel sounds are present. Soft, non-tender.  Extremities: No lower extremity edema.   LABS: Basic Metabolic Panel:  Recent Labs  05/12/14 1130 05/10/14 0112  NA 147 140  K 4.0 4.0  CL 106 102  CO2 23 22  GLUCOSE 135* 123*  BUN 14 12  CREATININE 0.98 0.81  CALCIUM 10.4 9.1   CBC:  Recent Labs  05/09/14 1130 05/09/14 1851 05/10/14 0112  WBC 11.1*  --  16.7*  NEUTROABS 9.0*  --   --   HGB 13.9  --  12.5  HCT 42.1  --  37.5  MCV 89.6  --  89.7  PLT 357 366 338   Cardiac Enzymes:  Recent Labs  05/09/14 1420 05/09/14 1851 05/10/14 0112  TROPONINI >20.00* >20.00* >20.00*   Current Meds: . aspirin  81 mg Oral Daily  . atorvastatin  80 mg Oral q1800  . carvedilol  6.25 mg Oral BID WC  . lisinopril  2.5 mg Oral Daily  . loratadine  10 mg Oral Daily  . prasugrel  10 mg Oral Daily   Cardiac cath 05/09/14: The left main coronary artery is widely patent.  The left anterior descending artery is a large vessel proximally. The stents in the mid vessel is occluded. After thrombectomy, it was noted that there is residual thrombus in the stent and just past the stent, there is a moderate disease in the segment past the stent. The mid to distal LAD opened up to a large vessel. .  The left circumflex artery is a large vessel. There is a large ramus  vessel which has mild diffuse disease. Branches across the lateral wall. Circumflex vessel is large. There is moderate, proximal disease. The OM1 branch has a 60% stenosis.  The right coronary artery is a small caliber, diffusely diseased vessel. There is TIMI 3 flow. The posterior lateral artery and posterior descending artery are small but patent. He was difficult to selectively engage the RCA. Due to the fact that this is a emergency procedure, we moved onto the intervention of the LAD.   ASSESSMENT AND PLAN:  1. CAD/Acute anterior STEMI: pt with recent LAD stent placement. She did not take her Brilinta and was readmitted 05/09/14 with acute occlusion of the proximal LAD stent. Aspiration thrombectomy restored flow in the LAD. Found to have significant disease in the LAD just beyond the stent. A new DES was placed overlapping the older stent in the proximal LAD. She is know on ASA and Effient. Stable this am. Continue ASA, Effient, beta blocker, Ace-inh, statin. Will wean NTG drip to off. Of note, her EKG this am shows Q-waves across the precordium with ST elevation c/w evolving changes post LAD infarct. She has  no chest pain. Will leave in ICU today.   2. HTN: BP controlled.   3. Ischemic cardiomyopathy: LVEF was 30-35% by echo on 05/04/14. Will repeat echo now. She may need a Lifevest before discharge and I have discussed this. Continue current medical therapy.   4. Tobacco abuse:  Complete smoking cessation recommended.   5. Hyperlipidemia: She is on a statin.   MCALHANY,CHRISTOPHER  6/25/20157:20 AM

## 2014-05-11 LAB — BASIC METABOLIC PANEL
BUN: 15 mg/dL (ref 6–23)
CO2: 23 mEq/L (ref 19–32)
Calcium: 9 mg/dL (ref 8.4–10.5)
Chloride: 100 mEq/L (ref 96–112)
Creatinine, Ser: 1.04 mg/dL (ref 0.50–1.10)
GFR calc Af Amer: 65 mL/min — ABNORMAL LOW (ref >90)
GFR calc non Af Amer: 56 mL/min — ABNORMAL LOW (ref >90)
Glucose, Bld: 102 mg/dL — ABNORMAL HIGH (ref 70–99)
Potassium: 3.8 mEq/L (ref 3.7–5.3)
Sodium: 138 mEq/L (ref 137–147)

## 2014-05-11 LAB — CBC
HCT: 33.6 % — ABNORMAL LOW (ref 36.0–46.0)
Hemoglobin: 10.8 g/dL — ABNORMAL LOW (ref 12.0–15.0)
MCH: 28.9 pg (ref 26.0–34.0)
MCHC: 32.1 g/dL (ref 30.0–36.0)
MCV: 89.8 fL (ref 78.0–100.0)
Platelets: 285 10*3/uL (ref 150–400)
RBC: 3.74 MIL/uL — ABNORMAL LOW (ref 3.87–5.11)
RDW: 13.3 % (ref 11.5–15.5)
WBC: 13 10*3/uL — ABNORMAL HIGH (ref 4.0–10.5)

## 2014-05-11 NOTE — Progress Notes (Signed)
CARDIAC REHAB PHASE I   PRE:  Rate/Rhythm: 89 SR    BP: sitting 123/66    SaO2:   MODE:  Ambulation: 700 ft   POST:  Rate/Rhythm: 98 SR    BP: sitting 107/51     SaO2:   Tolerated well with very slow pace. Talked while walking. BP better today. Ed completed with seemingly good understanding. Pt had not filled NTG last admit either. Pt still interested in CRPII. [id number]   Elissa Lovett [place] CES, ACSM 05/11/2014 9:37 AM

## 2014-05-11 NOTE — Progress Notes (Signed)
Nutrition Education Note  RD consulted for nutrition education regarding a Heart Healthy diet.   Lipid Panel     Component Value Date/Time   CHOL 231* 05/04/2014 0504   TRIG 118 05/04/2014 0504   HDL 56 05/04/2014 0504   CHOLHDL 4.1 05/04/2014 0504   VLDL 24 05/04/2014 0504   LDLCALC 151* 05/04/2014 0504    RD provided "Heart Healthy Nutrition Therapy" handout from the Academy of [employer]etics. Reviewed patient's dietary recall. Provided examples on ways to decrease sodium and fat intake in diet. Discouraged intake of processed foods and use of salt shaker. Encouraged fresh fruits and vegetables as well as whole grain sources of carbohydrates to maximize fiber intake. Teach back method used. Pt plans to decrease intake of bacon/sausage, red meat, and sugar in her diet and will try to increase intake of whole grains and nuts.   Expect good compliance.  Body mass index is 35.15 kg/(m^2). Pt meets criteria for Obesity based on current BMI.  Current diet order is Heart Healthy, patient is consuming approximately 100% of meals at this time. Labs and medications reviewed. No further nutrition interventions warranted at this time. RD contact information provided. If additional nutrition issues arise, please re-consult RD.  Ian Malkin RD, LDN Inpatient Clinical Dietitian Pager: [phone] After Hours Pager: [phone]

## 2014-05-11 NOTE — Progress Notes (Signed)
EKG CRITICAL VALUE     12 lead EKG performed.  Critical value noted.  Noah Charon, RN notified.   Dominik Lauricella C, CCT 05/11/2014 7:11 AM

## 2014-05-11 NOTE — Progress Notes (Addendum)
SUBJECTIVE: No chest pain or SOB.   BP 129/89  Pulse 93  Temp(Src) 98.7 F (37.1 C) (Oral)  Resp 19  Ht 5\' 5"  (1.651 m)  Wt 211 lb 3.2 oz (95.8 kg)  BMI 35.15 kg/m2  SpO2 95%  Intake/Output Summary (Last 24 hours) at 05/11/14 0755 Last data filed at 05/11/14 0400  Gross per 24 hour  Intake  19.15 ml  Output   1885 ml  Net -1865.85 ml    PHYSICAL EXAM General: Well developed, well nourished, in no acute distress. Alert and oriented x 3.  Psych:  Good affect, responds appropriately Neck: No JVD. No masses noted.  Lungs: Clear bilaterally with no wheezes or rhonci noted.  Heart: RRR with no murmurs noted. Abdomen: Bowel sounds are present. Soft, non-tender.  Extremities: No lower extremity edema.   LABS: Basic Metabolic Panel:  Recent Labs  05/13/14 0112 05/11/14 0010  NA 140 138  K 4.0 3.8  CL 102 100  CO2 22 23  GLUCOSE 123* 102*  BUN 12 15  CREATININE 0.81 1.04  CALCIUM 9.1 9.0   CBC:  Recent Labs  05/09/14 1130  05/10/14 0112 05/11/14 0010  WBC 11.1*  --  16.7* 13.0*  NEUTROABS 9.0*  --   --   --   HGB 13.9  --  12.5 10.8*  HCT 42.1  --  37.5 33.6*  MCV 89.6  --  89.7 89.8  PLT 357  < > 338 285  < > = values in this interval not displayed. Cardiac Enzymes:  Recent Labs  05/09/14 1420 05/09/14 1851 05/10/14 0112  TROPONINI >20.00* >20.00* >20.00*   Current Meds: . aspirin  81 mg Oral Daily  . atorvastatin  80 mg Oral q1800  . carvedilol  6.25 mg Oral BID WC  . lisinopril  2.5 mg Oral Daily  . loratadine  10 mg Oral Daily  . prasugrel  10 mg Oral Daily   Echo 05/10/14: Left ventricle: The endocardium is nor well visualized. Images sugges severe hypokinesis to akinesis of the mid inferoseptum, distal septum, mid and apical anteroseptum. E/e&'>17 suggestive of elevated LV filling pressures. The cavity size was normal. Systolic function was mildly reduced. The estimated ejection fraction was in the range of 45% to 50%.  Cardiac  cath 05/09/14: The left main coronary artery is widely patent.  The left anterior descending artery is a large vessel proximally. The stents in the mid vessel is occluded. After thrombectomy, it was noted that there is residual thrombus in the stent and just past the stent, there is a moderate disease in the segment past the stent. The mid to distal LAD opened up to a large vessel. .  The left circumflex artery is a large vessel. There is a large ramus vessel which has mild diffuse disease. Branches across the lateral wall. Circumflex vessel is large. There is moderate, proximal disease. The OM1 branch has a 60% stenosis.  The right coronary artery is a small caliber, diffusely diseased vessel. There is TIMI 3 flow. The posterior lateral artery and posterior descending artery are small but patent. He was difficult to selectively engage the RCA. Due to the fact that this is a emergency procedure, we moved onto the intervention of the LAD.  ASSESSMENT AND PLAN:   1. CAD/Acute anterior STEMI: pt with recent LAD stent placement 05/03/14. She did not take her Brilinta at least for one day post discharge and was readmitted 05/09/14 with acute occlusion of the  proximal LAD stent. Aspiration thrombectomy restored flow in the LAD. Found to have significant disease in the LAD just beyond the stent. A new DES was placed overlapping the older stent in the proximal LAD. She is know on ASA and Effient. Stable this am. Continue ASA, Effient, beta blocker, Ace-inh, statin. Of note, her EKG post MI shows Q-waves across the precordium with ST elevation c/w evolving changes post LAD infarct. She has no chest pain. Will transfer to telemetry unit today.    2. HTN: BP controlled.   3. Ischemic cardiomyopathy: LVEF was 45% by echo on 05/10/14. Continue current medical therapy.   4. Tobacco abuse: Complete smoking cessation recommended.   5. Hyperlipidemia: She is on a statin.   Transfer to tele today. She should be ready  for d/c tomorrow if she is stable. I had initially thought she would need a Lifevest but LVEF appears to be greater than 35% by echo yesterday.   MCALHANY,CHRISTOPHER  6/26/20157:55 AM

## 2014-05-12 LAB — BASIC METABOLIC PANEL
BUN: 16 mg/dL (ref 6–23)
CO2: 26 mEq/L (ref 19–32)
Calcium: 9.2 mg/dL (ref 8.4–10.5)
Chloride: 103 mEq/L (ref 96–112)
Creatinine, Ser: 1.09 mg/dL (ref 0.50–1.10)
GFR calc Af Amer: 62 mL/min — ABNORMAL LOW (ref >90)
GFR calc non Af Amer: 53 mL/min — ABNORMAL LOW (ref >90)
Glucose, Bld: 98 mg/dL (ref 70–99)
Potassium: 4.1 mEq/L (ref 3.7–5.3)
Sodium: 142 mEq/L (ref 137–147)

## 2014-05-12 MED ORDER — CARVEDILOL 6.25 MG PO TABS
9.3750 mg | ORAL_TABLET | Freq: Two times a day (BID) | ORAL | Status: DC
  Administered 2014-05-12 (×2): 9.375 mg via ORAL
  Filled 2014-05-12 (×3): qty 1

## 2014-05-12 MED ORDER — CARVEDILOL 12.5 MG PO TABS
12.5000 mg | ORAL_TABLET | Freq: Two times a day (BID) | ORAL | Status: DC
Start: 2014-05-13 — End: 2014-05-23

## 2014-05-12 MED ORDER — CARVEDILOL 6.25 MG PO TABS: 9.3750 mg | ORAL_TABLET | Freq: Two times a day (BID) | ORAL | Status: DC

## 2014-05-12 MED ORDER — CARVEDILOL 6.25 MG PO TABS
9.3750 mg | ORAL_TABLET | Freq: Two times a day (BID) | ORAL | Status: DC
  Filled 2014-05-12 (×2): qty 1

## 2014-05-12 MED ORDER — PRASUGREL HCL 10 MG PO TABS: 10.0000 mg | ORAL_TABLET | Freq: Every day | ORAL | Status: DC

## 2014-05-12 NOTE — Discharge Summary (Signed)
Physician Discharge Summary  Patient ID: Deborah Ho MRN: [id number] DOB/AGE: 12/15/1950 63 y.o.  Admit date: 05/09/2014 Discharge date: 05/12/2014  Primary Discharge Diagnosis 1. STEMI of LAD 2. CAD: Severe three-vessel disease with culprit lesion in the proximal LAD, 3. severe LV systolic dysfunction with an EF of 30-35%  Secondary Discharge Diagnosis 1. Hypertension 2.Hyperlipidemia 3. ongoing tobacco abuse  Primary Cardiologist: Nahser MD  Significant Diagnostic Studies: 1. Cardiac Cath:  05/09/2014 IMPRESSIONS:  1. Normal left main coronary artery. 2. Subacute stent thrombosis of the day stent in the mid left anterior descending artery. Successful thrombectomy was performed with restoration of TIMI-3 flow. Intravascular ultrasound revealed a vessel diameter of about 3.5 mm. An overlapping stent was placed. This is a 3.0 x 23, overlapping the distal edge of the prior stent. The entire stented segment was post dilated with a 3.5 x 20 noncompliant balloon. Intravascular ultrasound was performed again showing excellent stent apposition and large lumen diameter.. 3. Moderate disease in the left circumflex artery and its branches. 4. Severe disease in the right coronary artery. 5. Left ventricular systolic function not assessed. LVEDP 23 mmHg. Marland Kitchen  RECOMMENDATION: Continue dual antiplatelet therapy. IV tirofiban will be continued as well. Continue medical therapy for RCA disease. We stressed the importance of taking her medications as prescribed. Continue aggressive secondary prevention.   2. Echocardiogram: 05/10/2014 Left ventricle: The endocardium is nor well visualized. Images sugges severe hypokinesis to akinesis of the mid inferoseptum, distal septum, mid and apical anteroseptum. E/e&'>17 suggestive of elevated LV filling pressures. The cavity size was normal. Systolic function was mildly reduced. The estimated ejection fraction was in the range of 45% to  50%.    Consults: None  Hospital Course:  Deborah Ho is a 63 year old patient of Dr. Elease Hashimoto admitted with recurrent chest pain with known history of ST elevation MI to the LAD on June 21,2015 with severe three-vessel disease and culprit lesion in the proximal LAD, and severe LV dysfunction. On that date she underwent successful stenting of the proximal LAD with a drug-eluting stent and was placed on dual antiplatelet therapy. She read presented to the emergency room on 05/09/2014 with recurrent severe chest pain with associated nausea and diaphoresis. She did miss one day of Brilinta. EKG revealed acute ST elevation in the anterior lateral leads. She was taken emergently to the cardiac catheterization lab.  Cardiac catheterization was completed along with iris of the LAD and aspiration thrombectomy of the LAD tissue subacute stent thrombosis, and PCI of LAD with overlapping stent to the distal edge of the prior stent. Intravascular ultrasound revealed excellent stent apposition and large lumen diameter. She was restarted on dual antiplatelet therapy,  She was followed closely in ICU post procedure, and was eventually transferred to telemetry. She was counseled on medical compliance and smoking cessation. The patient was continued on medication regimen with up titration of beta blockers. Aeration for life vest was discussed but LVEF appeared greater than 35% on followup echocardiogram which demonstrated ejection fraction of 45-50%. This was dated on 05/10/2014.  The patient was seen and examined by Dr. Tresa Endo on day of discharge, it is his recommendation that her carvedilol increased to 9.375 mg twice a day today, and then titrate up to 12.5 mg twice a day on Sunday (the day after discharge). Titrate ACE inhibitor as blood pressure allows as an outpatient on follow-up.   Discharge Exam: Blood pressure 105/72, pulse 90, temperature 97.5 F (36.4 C), temperature source Oral, resp. rate 19, height  5'  5" (1.651 m), weight 213 lb 13.5 oz (97 kg), SpO2 97.00%.   Labs:   Lab Results  Component Value Date   WBC 13.0* 05/11/2014   HGB 10.8* 05/11/2014   HCT 33.6* 05/11/2014   MCV 89.8 05/11/2014   PLT 285 05/11/2014     Recent Labs Lab 05/12/14 0335  NA 142  K 4.1  CL 103  CO2 26  BUN 16  CREATININE 1.09  CALCIUM 9.2  GLUCOSE 98   Lab Results  Component Value Date   TROPONINI >20.00* 05/10/2014    Lab Results  Component Value Date   CHOL 231* 05/04/2014   Lab Results  Component Value Date   HDL 56 05/04/2014   Lab Results  Component Value Date   LDLCALC 151* 05/04/2014   Lab Results  Component Value Date   TRIG 118 05/04/2014   Lab Results  Component Value Date   CHOLHDL 4.1 05/04/2014   No results found for this basename: LDLDIRECT      Radiology: Portable Chest X-ray 1 View  05/04/2014   CLINICAL DATA:  ST elevation myocardial infarction.  EXAM: PORTABLE CHEST - 1 VIEW  COMPARISON:  None.  FINDINGS: The lungs are well-aerated. Vascular congestion is noted, with mildly increased interstitial markings, raising question for minimal interstitial edema. There is no evidence of pleural effusion or pneumothorax.  The cardiomediastinal silhouette is normal in size. No acute osseous abnormalities are seen.  IMPRESSION: Vascular congestion, with mildly increased interstitial markings, raising question for minimal interstitial edema.   Electronically Signed   By: Roanna Raider M.D.   On: 05/04/2014 01:16    FOLLOW UP PLANS AND APPOINTMENTS    Medication List    STOP taking these medications       ticagrelor 90 MG Tabs tablet  Commonly known as:  BRILINTA      TAKE these medications       aspirin 81 MG chewable tablet  Chew 1 tablet (81 mg total) by mouth daily.     atorvastatin 80 MG tablet  Commonly known as:  LIPITOR  Take 1 tablet (80 mg total) by mouth daily at 6 PM.     carvedilol 6.25 MG tablet  Commonly known as:  COREG  Take 1.5 tablets (9.375 mg  total) by mouth 2 (two) times daily with a meal.     carvedilol 12.5 MG tablet  Commonly known as:  COREG  Take 1 tablet (12.5 mg total) by mouth 2 (two) times daily with a meal.  Start taking on:  05/13/2014     cetirizine 10 MG tablet  Commonly known as:  ZYRTEC  Take 10 mg by mouth daily as needed for allergies.     lisinopril 2.5 MG tablet  Commonly known as:  PRINIVIL,ZESTRIL  Take 1 tablet (2.5 mg total) by mouth daily.     nitroGLYCERIN 0.4 MG SL tablet  Commonly known as:  NITROSTAT  Place 1 tablet (0.4 mg total) under the tongue every 5 (five) minutes x 3 doses as needed for chest pain.     prasugrel 10 MG Tabs tablet  Commonly known as:  EFFIENT  Take 1 tablet (10 mg total) by mouth daily.       Follow-up Information   Follow up with Peter Swaziland, MD. (Our office will call you for appt)    Specialty:  Cardiology   Contact information:   [address] [place] [address] [phone]  Time spent with patient to include physician time:40 minutes  Signed: Bettey Mare. Lawrence NP  05/12/2014, 3:11 PM Co-Sign MD

## 2014-05-12 NOTE — Progress Notes (Signed)
   Subjective:  Day 3 s/p recurrent  STEMI; wanting to go home  Objective:   Vital Signs in the last 24 hours: Temp:  [97.9 F (36.6 C)-98.5 F (36.9 C)] 98.5 F (36.9 C) (06/27 0415) Pulse Rate:  [90-96] 90 (06/27 0415) Resp:  [18-20] 19 (06/27 0415) BP: (112-118)/(65-72) 112/72 mmHg (06/27 0415) SpO2:  [97 %-100 %] 97 % (06/27 0415) Weight:  [213 lb 13.5 oz (97 kg)] 213 lb 13.5 oz (97 kg) (06/27 0415)  Intake/Output from previous day: 06/26 0701 - 06/27 0700 In: 730 [P.O.:730] Out: 401 [Urine:401]  Medications: . aspirin  81 mg Oral Daily  . atorvastatin  80 mg Oral q1800  . carvedilol  6.25 mg Oral BID WC  . lisinopril  2.5 mg Oral Daily  . loratadine  10 mg Oral Daily  . prasugrel  10 mg Oral Daily       Physical Exam:   General appearance: alert, cooperative and no distress Neck: no carotid bruit, no JVD, supple, symmetrical, trachea midline and thyroid not enlarged, symmetric, no tenderness/mass/nodules Lungs: clear to auscultation bilaterally Heart: regular rate and rhythm Abdomen: soft, non-tender; bowel sounds normal; no masses,  no organomegaly Extremities: no edema, redness or tenderness in the calves or thighs Pulses: 2+ and symmetric Neurologic: Grossly normal   Rate: 90's  Rhythm: normal sinus rhythm  Lab Results:   Recent Labs  05/11/14 0010 05/12/14 0335  NA 138 142  K 3.8 4.1  CL 100 103  CO2 23 26  GLUCOSE 102* 98  BUN 15 16  CREATININE 1.04 1.09     Recent Labs  05/09/14 1851 05/10/14 0112  TROPONINI >20.00* >20.00*    Hepatic Function Panel No results found for this basename: PROT, ALBUMIN, AST, ALT, ALKPHOS, BILITOT, BILIDIR, IBILI,  in the last 72 hours  Recent Labs  05/09/14 1130  INR 0.93   BNP (last 3 results)  Recent Labs  05/04/14 0201  PROBNP 1714.0*    Lipid Panel     Component Value Date/Time   CHOL 231* 05/04/2014 0504   TRIG 118 05/04/2014 0504   HDL 56 05/04/2014 0504   CHOLHDL 4.1 05/04/2014  0504   VLDL 24 05/04/2014 0504   LDLCALC 151* 05/04/2014 0504      Imaging:  No results found.    Assessment/Plan:   Principal Problem:   ST elevation myocardial infarction (STEMI) of anterior wall Active Problems:   HLD (hyperlipidemia)   Tobacco abuse   HTN (hypertension)   Cardiomyopathy, ischemic, EF 30-35 % Echo 05/04/14.   STEMI (ST elevation myocardial infarction)   Coronary atherosclerosis of native coronary artery  No recurrent cp. Now on pasugrel. Apparently she had missed 2 doses of brilinta leading to her re-STEMI and thrombotic occlussion. Will increase coreg to 9.375 mg bid today and titrate to 12.5 MG bid. Titrate ACE-I as BP allows as outpatient. Ambulate this am and probable dc later today.    Lennette Bihari, MD, [facility] 05/12/2014, 9:02 AM

## 2014-05-12 NOTE — Progress Notes (Signed)
Clinical Child psychotherapist (CSW) received call from RN stating that patient needs a bus pass home because she is not medically stable to walk home. Patient reported that she lives in [place] right around the corner from the hospital. Patient reported that she is familiar with the bus routes and knows where the nearest bus stop is at. CSW gave patient bus pass. Please reconsult if further social work needs arise. CSW signing off.   Jetta Lout, LCSWA Weekend CSW [phone]

## 2014-05-12 NOTE — Progress Notes (Signed)
CARDIAC REHAB PHASE I   PRE:  Rate/Rhythm: 92 sinus  BP:  Supine:   Sitting: 126/64  Standing:    SaO2: 96 RA  MODE:  Ambulation: 1000 ft   POST:  Rate/Rhythem: 102 sinus tach  BP:  Supine:   Sitting: 116/64  Standing:    SaO2: 99 RA  Pt ambulated independently while we reviewed d/c education from yesterday.  Pt had no complaints during walk and tolerated it well, no CP.  Pt had a few follow up questions on exercise and meds from education, which were answered.  Pt was able to repeat most of what she was supposed to do.  She is still interested in Phase II Rehab.  We will let them know she is ready to start when able.   Fabio Pierce, MA, ACSM RCEP [phone]  Hazle Nordmann

## 2014-05-12 NOTE — Progress Notes (Signed)
Pt discharge instruction reviewed prescriptions given including effient. Called pharmacy to clarify medication.

## 2014-05-14 NOTE — Telephone Encounter (Signed)
Spoke to patient she was admitted to [facility] hospital last week with chest pain.Stated she was feeling better.Advised to keep appointment with Dr.Jordan 05/23/14 and will determine return to work date.

## 2014-05-16 ENCOUNTER — Telehealth: Payer: Self-pay | Admitting: Cardiology

## 2014-05-16 NOTE — Telephone Encounter (Signed)
Patient would like a note to return to work on 04/21/14. Please call and advise.

## 2014-05-16 NOTE — Telephone Encounter (Signed)
Returned call to patient she stated she would like to return to work 05/21/14.Advised she has post hospital appointment with Dr.Jordan 05/23/14 and he will let her know when to return to work at that visit.

## 2014-05-23 ENCOUNTER — Encounter: Payer: Self-pay | Admitting: Cardiology

## 2014-05-23 ENCOUNTER — Ambulatory Visit (INDEPENDENT_AMBULATORY_CARE_PROVIDER_SITE_OTHER): Payer: No Typology Code available for payment source | Admitting: Cardiology

## 2014-05-23 VITALS — BP 170/100 | HR 105 | Ht 65.5 in | Wt 211.5 lb

## 2014-05-23 DIAGNOSIS — I2589 Other forms of chronic ischemic heart disease: Secondary | ICD-10-CM

## 2014-05-23 DIAGNOSIS — I25119 Atherosclerotic heart disease of native coronary artery with unspecified angina pectoris: Secondary | ICD-10-CM

## 2014-05-23 DIAGNOSIS — I2510 Atherosclerotic heart disease of native coronary artery without angina pectoris: Secondary | ICD-10-CM

## 2014-05-23 DIAGNOSIS — I209 Angina pectoris, unspecified: Secondary | ICD-10-CM

## 2014-05-23 DIAGNOSIS — I255 Ischemic cardiomyopathy: Secondary | ICD-10-CM

## 2014-05-23 DIAGNOSIS — I2109 ST elevation (STEMI) myocardial infarction involving other coronary artery of anterior wall: Secondary | ICD-10-CM

## 2014-05-23 DIAGNOSIS — Z720 Tobacco use: Secondary | ICD-10-CM

## 2014-05-23 DIAGNOSIS — E785 Hyperlipidemia, unspecified: Secondary | ICD-10-CM

## 2014-05-23 DIAGNOSIS — F17200 Nicotine dependence, unspecified, uncomplicated: Secondary | ICD-10-CM

## 2014-05-23 MED ORDER — CARVEDILOL 12.5 MG PO TABS
12.5000 mg | ORAL_TABLET | Freq: Two times a day (BID) | ORAL | Status: DC
Start: 2014-05-23 — End: 2014-05-24

## 2014-05-23 MED ORDER — LOSARTAN POTASSIUM 25 MG PO TABS: 25.0000 mg | ORAL_TABLET | Freq: Every day | ORAL | Status: DC

## 2014-05-23 NOTE — Progress Notes (Signed)
Deborah Ho Date of Birth: January 15, 1951 Medical Record #[id number]  History of Present Illness: Deborah Ho is seen for follow up today post hospitalization. She is a 64 yo BF who is s/p Anterior STEMI on May 06, 2014 treated emergently with a DES. EF was reduced at 35%. She also had diffuse moderate disease in the proximal RCA. She was enrolled in the Complete trial and randomized to medical treatment arm. After DC she presented with recurrent STEMI with reocclusion of the LAD. She did miss one day of her Brilinta and ASA. She underwent repeat intervention with thrombectomy and extension of stent distal to prior stent. She was treated with ASA and Effient. Follow up Echo showed EF of 45-50%. She was started on lisinopril and Carvedilol.  On follow up today patient reports she is doing great. Affect seems somewhat inappropriate. Did not take carvedilol because she didn't see a reason for taking 2 blood pressure medications. States she has a nonproductive cough. Has noted some mild congestion when lying supine. Planned to resume working tomorrow.     Medication List       This list is accurate as of: 05/23/14  5:30 PM.  Always use your most recent med list.               aspirin 81 MG chewable tablet  Chew 1 tablet (81 mg total) by mouth daily.     atorvastatin 80 MG tablet  Commonly known as:  LIPITOR  Take 1 tablet (80 mg total) by mouth daily at 6 PM.     carvedilol 12.5 MG tablet  Commonly known as:  COREG  Take 1 tablet (12.5 mg total) by mouth 2 (two) times daily with a meal.     cetirizine 10 MG tablet  Commonly known as:  ZYRTEC  Take 10 mg by mouth daily as needed for allergies.     lisinopril 2.5 MG tablet  Commonly known as:  PRINIVIL,ZESTRIL  Take 1 tablet (2.5 mg total) by mouth daily.     nitroGLYCERIN 0.4 MG SL tablet  Commonly known as:  NITROSTAT  Place 1 tablet (0.4 mg total) under the tongue every 5 (five) minutes x 3 doses as needed for chest pain.     prasugrel 10 MG Tabs tablet  Commonly known as:  EFFIENT  Take 1 tablet (10 mg total) by mouth daily.         Allergies  Allergen Reactions  . Lactose Intolerance (Gi)   . Sulfa Antibiotics Swelling  . Wheat Bran     Past Medical History  Diagnosis Date  . GERD (gastroesophageal reflux disease)     Probable  . ST elevation (STEMI) myocardial infarction involving left anterior descending coronary artery   . HTN (hypertension)   . Hyperlipidemia   . Tobacco use     Past Surgical History  Procedure Laterality Date  . Partial hysterectomy    . Cholecystectomy      History   Social History  . Marital Status: Widowed    Spouse Name: N/A    Number of Children: N/A  . Years of Education: N/A   Occupational History  . not employed    Social History Main Topics  . Smoking status: Current Every Day Smoker -- 0.25 packs/day for 40 years    Types: Cigarettes  . Smokeless tobacco: Never Used  . Alcohol Use: No  . Drug Use: No  . Sexual Activity: None   Other Topics Concern  . None  Social History Narrative   Patient has 6 brothers and sisters and none have known coronary artery disease. She lives alone in [place], but has several brothers in the area.    History reviewed. No pertinent family history.  Review of Systems: The review of systems is positive for some sinus congestion with postnasal drip. Takes Zyrtec.  All other systems were reviewed and are negative.  Physical Exam: BP 170/100  Pulse 105  Ht 5' 5.5" (1.664 m)  Wt 211 lb 8 oz (95.936 kg)  BMI 34.65 kg/m2 Filed Weights   05/23/14 1636  Weight: 211 lb 8 oz (95.936 kg)  GENERAL:  Well appearing BF in NAD HEENT:  PERRL, EOMI, sclera are clear. Oropharynx is clear. NECK:  No jugular venous distention, carotid upstroke brisk and symmetric, no bruits, no thyromegaly or adenopathy LUNGS:  Clear to auscultation bilaterally CHEST:  Unremarkable HEART:  RRR,  PMI not displaced or sustained,S1 and S2  within normal limits, no S3, no S4: no clicks, no rubs, no murmurs ABD:  Soft, nontender. BS +, no masses or bruits. No hepatomegaly, no splenomegaly EXT:  2 + pulses throughout, no edema, no cyanosis no clubbing. Radial site without hematoma.  SKIN:  Warm and dry.  No rashes NEURO:  Alert and oriented x 3. Cranial nerves II through XII intact. PSYCH:  Cognitively intact but affect seems inappropriately dysphoric. States she is "great" and that taking medications is "no problem". Has little insight into recent problems.     LABORATORY DATA: Ecg: NSR, Recent anterior infarct.   Assessment / Plan: 1. Recent anterior STEMI s/p DES of LAD. Recurrent STEMI with reocclusion of LAD. Possibly related to noncompliance with medication. S/p reintervention. Stressed the importance of medication compliance to reduce risk of recurrent MI, help with LV remodeling, and reduce risk of CHF. Will resume carvedilol 12.5 mg bid. With cough will switch lisinopril to losartan 25 mg bid. Encourage patient to enroll in outpatient cardiac Rehab. Given recent events I have recommended she remain out of work for one month. OK to return July 27. Will arrange follow up in one month for titration of meds.   2. Ischemic cardiomyopathy. Titrate ARB and beta blocker as tolerated. Would reassess LV function in 3 months.  3. HTN - BP quite high today. Stressed importance of taking medications.  4. Dyslipidemia on high dose statin.   5. Tobacco use. She states she only smokes once a day. Encouraged total cessation.

## 2014-05-23 NOTE — Patient Instructions (Signed)
1- Start taking Carvedilol 12.5 mg twice a day  2-Stop Lisinopril due to cough /  Start Losartan 25 mg daily prescription sent to pharmacy  3- Continue all other medications  4- DO NOT STOP MEDICATIONS SPEAK TO DR. FIRST  5- Cardiac Rehab will call you with appointment  6- Dr.Jordan advised needs to stay out of work for 1 month  7-Schedule appointment with PA in 1 month

## 2014-05-24 ENCOUNTER — Telehealth: Payer: Self-pay

## 2014-05-24 MED ORDER — CARVEDILOL 12.5 MG PO TABS
12.5000 mg | ORAL_TABLET | Freq: Two times a day (BID) | ORAL | Status: DC
Start: 2014-05-24 — End: 2014-06-07

## 2014-05-24 NOTE — Telephone Encounter (Signed)
Received message coreg too expensive.Patient called she will try [facility].Coreg prescription sent to [facility].Advised to call back if still too expensive.

## 2014-06-07 ENCOUNTER — Telehealth: Payer: Self-pay | Admitting: Cardiology

## 2014-06-07 ENCOUNTER — Ambulatory Visit (HOSPITAL_COMMUNITY): Payer: No Typology Code available for payment source

## 2014-06-07 NOTE — Telephone Encounter (Signed)
New message     Pt want to know if she can take 1/2 carvedilol.  She says the medication makes her "sick". Please advise

## 2014-06-07 NOTE — Telephone Encounter (Signed)
Returned call to patient she stated since she started taking coreg 12.5 mg twice a day she is nauseated,headache.Message sent to Dr.Jordan for advice.

## 2014-06-07 NOTE — Telephone Encounter (Signed)
Returned call to patient Dr.Jordan advised to decrease Coreg to 6.25 mg twice a day.Advised to call back if not better.

## 2014-06-07 NOTE — Telephone Encounter (Signed)
OK to reduce Coreg to 6.25 mg bid.  Peter Swaziland MD, [facility]

## 2014-06-07 NOTE — Addendum Note (Signed)
Addended by: Meda Klinefelter D on: 06/07/2014 02:05 PM   Modules accepted: Orders, Medications

## 2014-06-11 ENCOUNTER — Ambulatory Visit (HOSPITAL_COMMUNITY): Payer: No Typology Code available for payment source

## 2014-06-13 ENCOUNTER — Ambulatory Visit (HOSPITAL_COMMUNITY): Payer: No Typology Code available for payment source

## 2014-06-15 ENCOUNTER — Ambulatory Visit (HOSPITAL_COMMUNITY): Payer: No Typology Code available for payment source

## 2014-06-18 ENCOUNTER — Ambulatory Visit (HOSPITAL_COMMUNITY): Payer: No Typology Code available for payment source

## 2014-06-20 ENCOUNTER — Telehealth: Payer: Self-pay | Admitting: Cardiology

## 2014-06-20 ENCOUNTER — Ambulatory Visit (HOSPITAL_COMMUNITY): Payer: No Typology Code available for payment source

## 2014-06-20 NOTE — Telephone Encounter (Signed)
Please call,pt has a boil and it bleeds sometimes.Pt is concerned because she is on Effient.

## 2014-06-20 NOTE — Telephone Encounter (Signed)
Returned call to patient she stated she has a boil in her vaginal area that bleeds at times.Advised she needs to continue Effient.Advised to see PCP.

## 2014-06-22 ENCOUNTER — Ambulatory Visit (HOSPITAL_COMMUNITY): Payer: No Typology Code available for payment source

## 2014-06-25 ENCOUNTER — Ambulatory Visit (INDEPENDENT_AMBULATORY_CARE_PROVIDER_SITE_OTHER): Payer: No Typology Code available for payment source | Admitting: Cardiology

## 2014-06-25 ENCOUNTER — Other Ambulatory Visit: Payer: Self-pay | Admitting: *Deleted

## 2014-06-25 ENCOUNTER — Ambulatory Visit (HOSPITAL_COMMUNITY): Payer: No Typology Code available for payment source

## 2014-06-25 ENCOUNTER — Encounter: Payer: Self-pay | Admitting: Cardiology

## 2014-06-25 VITALS — BP 180/110 | HR 95 | Ht 65.0 in | Wt 205.0 lb

## 2014-06-25 DIAGNOSIS — I10 Essential (primary) hypertension: Secondary | ICD-10-CM

## 2014-06-25 DIAGNOSIS — Z79899 Other long term (current) drug therapy: Secondary | ICD-10-CM

## 2014-06-25 DIAGNOSIS — I255 Ischemic cardiomyopathy: Secondary | ICD-10-CM

## 2014-06-25 DIAGNOSIS — I2589 Other forms of chronic ischemic heart disease: Secondary | ICD-10-CM

## 2014-06-25 DIAGNOSIS — E785 Hyperlipidemia, unspecified: Secondary | ICD-10-CM

## 2014-06-25 DIAGNOSIS — F17200 Nicotine dependence, unspecified, uncomplicated: Secondary | ICD-10-CM

## 2014-06-25 DIAGNOSIS — I2510 Atherosclerotic heart disease of native coronary artery without angina pectoris: Secondary | ICD-10-CM

## 2014-06-25 DIAGNOSIS — Z720 Tobacco use: Secondary | ICD-10-CM

## 2014-06-25 MED ORDER — CARVEDILOL 12.5 MG PO TABS: 12.5000 mg | ORAL_TABLET | Freq: Two times a day (BID) | ORAL | Status: DC

## 2014-06-25 NOTE — Assessment & Plan Note (Addendum)
05/03/2014 STEMI with 95% stenosis LAD undergoing emergent DES Xience Alpine- residual disease proximal RCA 70-80%, nonobstructive disease circumflex. EF 35%. 05/09/2014 STEMI anterior wall with restenosis with thrombus of the LAD undergoing emergent DES overlapping stents. EF by echo 45-50%.  She had missed a dose of Brilinta  Please note also patient was not to return to work after this visit but she admits to returning to work July 27.  We discussed at length her coronary disease and that she still has disease in the right coronary artery and how she can stabilize this disease.

## 2014-06-25 NOTE — Assessment & Plan Note (Signed)
Uncontrolled hypertension. She stated she was taking her medications.  Though she admits that the Coreg makes her sick.  She will now take Coreg 8 AM and 8 PM recheck blood pressure in one week if still elevated would increase her losartan.  We'll check comprehensive metabolic panel with lipid panel this week or first of next.

## 2014-06-25 NOTE — Patient Instructions (Addendum)
We will schedule you to come back next week for a BP check with Haig Prophet our clinical pharmacist   Your physician recommends that you schedule a follow-up appointment in: one month with Dr. Swaziland  We have labs for you to get done , be sure that you are fasting when you get them done next week  Take Coreg 12.5 mg two times a day                           Take at 8 am and 8 pm

## 2014-06-25 NOTE — Assessment & Plan Note (Signed)
EF by echo had improved to 45-50%.  She developed cough on lisinopril but was changed to an ARB.

## 2014-06-25 NOTE — Assessment & Plan Note (Signed)
She takes 40 mg of Lipitor daily and 80 mg she was nauseated Will recheck lipid panel.

## 2014-06-25 NOTE — Progress Notes (Signed)
06/25/2014   PCP: No PCP Per Patient   Chief Complaint  Patient presents with  . Follow-up    Primary Cardiologist:Dr. P. Swaziland   HPI:  63 year old female is seen for follow up.  She is s/p Anterior STEMI on May 06, 2014 treated emergently with a DES. EF was reduced at 35%. She also had diffuse moderate disease in the proximal RCA. She was enrolled in the Complete trial and randomized to medical treatment arm. After DC she presented with recurrent STEMI with reocclusion of the LAD. She did miss one day of her Brilinta and ASA. She underwent repeat intervention with thrombectomy and extension of stent distal to prior stent. She was treated with ASA and Effient. Follow up Echo showed EF of 45-50%. She was started on lisinopril and Carvedilol. She had stopped some of her medication. Her carvedilol was resumed at 12.5 mg twice a day.  She presents today hypertensive. She denies chest pain or shortness of breath when asked if she's taken her medications she says yes but it makes her sick i.e. Nauseated.  She takes Corag at 8 AM and again at 54. Her blood pressure drops to 150 systolic which she believes to be very low for her.  We discussed her goal 1:30 to 140 systolic. We also discussed her coronary artery disease which she does not believe she has. We discussed her 2 heart attacks which she stated that is what you have told me.  I explained in detail her coronary disease, or high blood pressure and held to prevent further disease.  The patient listened politely but I believe she will do what she would like.  She has not missed her aspirin or Effient.   Allergies  Allergen Reactions  . Lactose Intolerance (Gi)   . Sulfa Antibiotics Swelling  . Wheat Bran     Current Outpatient Prescriptions  Medication Sig Dispense Refill  . aspirin 81 MG chewable tablet Chew 1 tablet (81 mg total) by mouth daily.  30 tablet  5  . atorvastatin (LIPITOR) 80 MG tablet Take 40 mg by mouth  daily at 6 PM.      . carvedilol (COREG) 12.5 MG tablet Take 1 tablet (12.5 mg total) by mouth 2 (two) times daily. Take 12.5 at 8 am and 8 pm  60 tablet  6  . cetirizine (ZYRTEC) 10 MG tablet Take 10 mg by mouth daily as needed for allergies.      Marland Kitchen losartan (COZAAR) 25 MG tablet Take 1 tablet (25 mg total) by mouth daily.  30 tablet  6  . nitroGLYCERIN (NITROSTAT) 0.4 MG SL tablet Place 1 tablet (0.4 mg total) under the tongue every 5 (five) minutes x 3 doses as needed for chest pain.  25 tablet  12  . prasugrel (EFFIENT) 10 MG TABS tablet Take 1 tablet (10 mg total) by mouth daily.  30 tablet  6   No current facility-administered medications for this visit.    Past Medical History  Diagnosis Date  . GERD (gastroesophageal reflux disease)     Probable  . ST elevation (STEMI) myocardial infarction involving left anterior descending coronary artery 05/03/14; 05/09/14  . HTN (hypertension)   . Hyperlipidemia   . Tobacco use   . CAD (coronary artery disease), native coronary artery 04/2014    STEMI X 2, with stents to LAD   . Non compliance w medication regimen     Past Surgical History  Procedure Laterality Date  . Partial hysterectomy    . Cholecystectomy    . Coronary angioplasty with stent placement  05/03/14    STEMI- stent to LAD DES- Xience alpine  . Coronary angioplasty with stent placement  05/09/14    STEMI- overlapping stent to LAD, pt had missed a dose of Brilinta    [name]:NFAOZHY:QM colds or fevers, weight loss Skin:no rashes or ulcers HEENT:no blurred vision, no congestion CV:see HPI PUL:see HPI GI:no diarrhea constipation or melena, no indigestion, + nausea after coreg GU:no hematuria, no dysuria, boil of perineum resolved  MS:no joint pain, no claudication Neuro:no syncope, no lightheadedness Endo:no diabetes, no thyroid disease  Wt Readings from Last 3 Encounters:  06/25/14 205 lb (92.987 kg)  05/23/14 211 lb 8 oz (95.936 kg)  05/12/14 213 lb 13.5 oz (97 kg)     PHYSICAL EXAM BP 180/110  Pulse 95  Ht 5\' 5"  (1.651 m)  Wt 205 lb (92.987 kg)  BMI 34.11 kg/m2  Re check BP 160/108 General:Pleasant affect, NAD, does not believe she had a heart attack, only stated they say I have, She stated she had no heart disease. Skin:Warm and dry, brisk capillary refill HEENT:normocephalic, sclera clear, mucus membranes moist Neck:supple, no JVD, no bruits  Heart:S1S2 RRR without murmur, gallup, rub or click Lungs:clear without rales, rhonchi, or wheezes , non tender, + BS, do not palpate liver spleen or masses Ext:no lower ext edema, 2+ pedal pulses, 2+ radial pulses Neuro:alert and oriented, MAE, follows commands, + facial symmetry Psych:  Cognitively intact, affect seems inappropriately dysphoric- not following recommendations, problems with meds- make her sick- nauseated. Shocked that her BP should be 130-140/ 84 at the most.       EKG:SR no acute changes from 05/23/14 recent ant MI  ASSESSMENT AND PLAN Coronary atherosclerosis of native coronary artery  05/03/2014 STEMI with 95% stenosis LAD undergoing emergent DES Xience Alpine- residual disease proximal RCA 70-80%, nonobstructive disease circumflex. EF 35%. 05/09/2014 STEMI anterior wall with restenosis with thrombus of the LAD undergoing emergent DES overlapping stents. EF by echo 45-50%.  She had missed a dose of Brilinta  Please note also patient was not to return to work after this visit but she admits to returning to work July 27.  We discussed at length her coronary disease and that she still has disease in the right coronary artery and how she can stabilize this disease.   Cardiomyopathy, ischemic, EF 30-35 % Echo 05/04/14. EF by echo had improved to 45-50%.  She developed cough on lisinopril but was changed to an ARB.  Tobacco abuse Continues to have one cigarette a day, reminded it would be helpful to stop  HTN (hypertension) Uncontrolled hypertension. She stated she was taking  her medications.  Though she admits that the Coreg makes her sick.  She will now take Coreg 8 AM and 8 PM recheck blood pressure in one week if still elevated would increase her losartan.  We'll check comprehensive metabolic panel with lipid panel this week or first of next.  HLD (hyperlipidemia) She takes 40 mg of Lipitor daily and 80 mg she was nauseated Will recheck lipid panel.

## 2014-06-25 NOTE — Assessment & Plan Note (Signed)
Continues to have one cigarette a day, reminded it would be helpful to stop

## 2014-06-27 ENCOUNTER — Ambulatory Visit (HOSPITAL_COMMUNITY): Payer: No Typology Code available for payment source

## 2014-06-29 ENCOUNTER — Ambulatory Visit (HOSPITAL_COMMUNITY): Payer: No Typology Code available for payment source

## 2014-07-02 ENCOUNTER — Ambulatory Visit (HOSPITAL_COMMUNITY): Payer: No Typology Code available for payment source

## 2014-07-04 ENCOUNTER — Ambulatory Visit: Payer: No Typology Code available for payment source | Admitting: Cardiology

## 2014-07-04 ENCOUNTER — Ambulatory Visit (HOSPITAL_COMMUNITY): Payer: No Typology Code available for payment source

## 2014-07-05 LAB — LIPID PANEL
Cholesterol: 209 mg/dL — ABNORMAL HIGH (ref 0–200)
HDL: 48 mg/dL (ref >39)
LDL Cholesterol: 127 mg/dL — ABNORMAL HIGH (ref 0–99)
Total CHOL/HDL Ratio: 4.4 Ratio
Triglycerides: 171 mg/dL — ABNORMAL HIGH (ref <150)
VLDL: 34 mg/dL (ref 0–40)

## 2014-07-06 ENCOUNTER — Ambulatory Visit (HOSPITAL_COMMUNITY): Payer: No Typology Code available for payment source

## 2014-07-09 ENCOUNTER — Ambulatory Visit (HOSPITAL_COMMUNITY): Payer: No Typology Code available for payment source

## 2014-07-11 ENCOUNTER — Ambulatory Visit (HOSPITAL_COMMUNITY): Payer: No Typology Code available for payment source

## 2014-07-13 ENCOUNTER — Ambulatory Visit (HOSPITAL_COMMUNITY): Payer: No Typology Code available for payment source

## 2014-07-16 ENCOUNTER — Ambulatory Visit (HOSPITAL_COMMUNITY): Payer: No Typology Code available for payment source

## 2014-07-18 ENCOUNTER — Ambulatory Visit (HOSPITAL_COMMUNITY): Payer: No Typology Code available for payment source

## 2014-07-20 ENCOUNTER — Ambulatory Visit (HOSPITAL_COMMUNITY): Payer: No Typology Code available for payment source

## 2014-07-23 ENCOUNTER — Ambulatory Visit (HOSPITAL_COMMUNITY): Payer: No Typology Code available for payment source

## 2014-07-25 ENCOUNTER — Ambulatory Visit (HOSPITAL_COMMUNITY): Payer: No Typology Code available for payment source

## 2014-07-27 ENCOUNTER — Ambulatory Visit: Payer: No Typology Code available for payment source | Admitting: Cardiology

## 2014-07-27 ENCOUNTER — Ambulatory Visit (HOSPITAL_COMMUNITY): Payer: No Typology Code available for payment source

## 2014-07-30 ENCOUNTER — Ambulatory Visit (HOSPITAL_COMMUNITY): Payer: No Typology Code available for payment source

## 2014-07-31 ENCOUNTER — Encounter: Payer: Self-pay | Admitting: Cardiology

## 2014-08-01 ENCOUNTER — Telehealth: Payer: Self-pay | Admitting: Cardiology

## 2014-08-01 ENCOUNTER — Ambulatory Visit (HOSPITAL_COMMUNITY): Payer: No Typology Code available for payment source

## 2014-08-01 NOTE — Telephone Encounter (Signed)
Please call,having problem with the chewable aspirin.

## 2014-08-01 NOTE — Telephone Encounter (Signed)
Returned call to patient she stated chewable 81 mg aspirin makes her mouth swell.Advised to take oral 81 mg aspirin daily.Advised to call back if continues to have swelling.

## 2014-08-03 ENCOUNTER — Encounter: Payer: Self-pay | Admitting: *Deleted

## 2014-08-03 ENCOUNTER — Ambulatory Visit (INDEPENDENT_AMBULATORY_CARE_PROVIDER_SITE_OTHER): Payer: No Typology Code available for payment source | Admitting: Cardiology

## 2014-08-03 ENCOUNTER — Ambulatory Visit (HOSPITAL_COMMUNITY): Payer: No Typology Code available for payment source

## 2014-08-03 VITALS — BP 154/110 | HR 104 | Ht 65.0 in | Wt 215.1 lb

## 2014-08-03 DIAGNOSIS — I10 Essential (primary) hypertension: Secondary | ICD-10-CM

## 2014-08-03 NOTE — Progress Notes (Signed)
Patient walked in to clinic requested BP check. She states she was told to come to the office to have her BP checked every so often.   She has complaints today of: 1. Cough (occurs when she lays down at night and in the afternoon after lunch) - the cough is productive, clear phlegm (per patient) - she reports that is makes her feel as if she will heave 2. Reports her BP is higher than normal today (despite reporting to patient her BP at her last OV) 3. Patient complains of oral swelling about 1 month after starting aspirin and Effient 4. Patient provided a list of questions (given to [place])

## 2014-08-03 NOTE — Assessment & Plan Note (Signed)
stable °

## 2014-08-03 NOTE — Patient Instructions (Signed)
Elnita Maxwell (Dr. Elvis Coil nurse) will talk with Dr. Swaziland about your complaints/issues discussed during the nurse visit today when he is back in the office on Tuesday and you will be contacted accordingly.

## 2014-08-03 NOTE — Progress Notes (Signed)
bp check only

## 2014-08-06 ENCOUNTER — Ambulatory Visit (HOSPITAL_COMMUNITY): Payer: No Typology Code available for payment source

## 2014-08-08 ENCOUNTER — Ambulatory Visit (HOSPITAL_COMMUNITY): Payer: No Typology Code available for payment source

## 2014-08-10 ENCOUNTER — Ambulatory Visit (HOSPITAL_COMMUNITY): Payer: No Typology Code available for payment source

## 2014-08-10 NOTE — Telephone Encounter (Signed)
Returned call to patient Dr.Jordan received letter you mailed him.He advised ok to take po 81 mg aspirin.Advised you will be on effient for 1 year,he will let you know when you can stop.Advised no weight loss or weight gain from medications you are on.Adviised ok to take mucinex if needed. Advised to call PCP for sleeping medication.Patient stated she was ready for cardiac rehab.Will send Byrd Hesselbach in cardiac rehab a message to enroll you.Patient stated she appreciated calling her back with response to her questions.Advised to keep follow up appointment with Norma Fredrickson NP 11/05/14 at 10:30 am at our [facility].Advised to call sooner if needed.

## 2014-08-13 ENCOUNTER — Other Ambulatory Visit: Payer: Self-pay | Admitting: Obstetrics and Gynecology

## 2014-08-13 ENCOUNTER — Encounter (HOSPITAL_COMMUNITY): Payer: Self-pay | Admitting: Emergency Medicine

## 2014-08-13 ENCOUNTER — Ambulatory Visit (HOSPITAL_COMMUNITY): Payer: No Typology Code available for payment source

## 2014-08-13 ENCOUNTER — Emergency Department (HOSPITAL_COMMUNITY): Payer: No Typology Code available for payment source

## 2014-08-13 ENCOUNTER — Inpatient Hospital Stay (HOSPITAL_COMMUNITY)
Admission: EM | Admit: 2014-08-13 | Discharge: 2014-08-17 | DRG: 293 | Disposition: A | Discharge status: Home / Self Care | Payer: No Typology Code available for payment source | Attending: Cardiology | Admitting: Cardiology

## 2014-08-13 DIAGNOSIS — Z1231 Encounter for screening mammogram for malignant neoplasm of breast: Secondary | ICD-10-CM

## 2014-08-13 DIAGNOSIS — E785 Hyperlipidemia, unspecified: Secondary | ICD-10-CM | POA: Diagnosis present

## 2014-08-13 DIAGNOSIS — Z87891 Personal history of nicotine dependence: Secondary | ICD-10-CM

## 2014-08-13 DIAGNOSIS — Z9119 Patient's noncompliance with other medical treatment and regimen: Secondary | ICD-10-CM | POA: Diagnosis present

## 2014-08-13 DIAGNOSIS — I10 Essential (primary) hypertension: Secondary | ICD-10-CM | POA: Diagnosis present

## 2014-08-13 DIAGNOSIS — I5023 Acute on chronic systolic (congestive) heart failure: Secondary | ICD-10-CM | POA: Diagnosis present

## 2014-08-13 DIAGNOSIS — Z7902 Long term (current) use of antithrombotics/antiplatelets: Secondary | ICD-10-CM

## 2014-08-13 DIAGNOSIS — I252 Old myocardial infarction: Secondary | ICD-10-CM

## 2014-08-13 DIAGNOSIS — Z8249 Family history of ischemic heart disease and other diseases of the circulatory system: Secondary | ICD-10-CM

## 2014-08-13 DIAGNOSIS — Z9861 Coronary angioplasty status: Secondary | ICD-10-CM

## 2014-08-13 DIAGNOSIS — T783XXA Angioneurotic edema, initial encounter: Secondary | ICD-10-CM | POA: Diagnosis present

## 2014-08-13 DIAGNOSIS — Z833 Family history of diabetes mellitus: Secondary | ICD-10-CM | POA: Diagnosis not present

## 2014-08-13 DIAGNOSIS — I509 Heart failure, unspecified: Secondary | ICD-10-CM

## 2014-08-13 DIAGNOSIS — K219 Gastro-esophageal reflux disease without esophagitis: Secondary | ICD-10-CM | POA: Diagnosis present

## 2014-08-13 DIAGNOSIS — I255 Ischemic cardiomyopathy: Secondary | ICD-10-CM | POA: Diagnosis present

## 2014-08-13 DIAGNOSIS — I2102 ST elevation (STEMI) myocardial infarction involving left anterior descending coronary artery: Secondary | ICD-10-CM

## 2014-08-13 DIAGNOSIS — J90 Pleural effusion, not elsewhere classified: Secondary | ICD-10-CM | POA: Diagnosis present

## 2014-08-13 DIAGNOSIS — I2589 Other forms of chronic ischemic heart disease: Secondary | ICD-10-CM

## 2014-08-13 DIAGNOSIS — T39015A Adverse effect of aspirin, initial encounter: Secondary | ICD-10-CM | POA: Diagnosis present

## 2014-08-13 DIAGNOSIS — Z79899 Other long term (current) drug therapy: Secondary | ICD-10-CM | POA: Diagnosis not present

## 2014-08-13 DIAGNOSIS — T458X5A Adverse effect of other primarily systemic and hematological agents, initial encounter: Secondary | ICD-10-CM | POA: Diagnosis present

## 2014-08-13 DIAGNOSIS — I2510 Atherosclerotic heart disease of native coronary artery without angina pectoris: Secondary | ICD-10-CM | POA: Diagnosis present

## 2014-08-13 DIAGNOSIS — Z720 Tobacco use: Secondary | ICD-10-CM | POA: Diagnosis present

## 2014-08-13 DIAGNOSIS — I5021 Acute systolic (congestive) heart failure: Secondary | ICD-10-CM

## 2014-08-13 HISTORY — DX: Atherosclerotic heart disease of native coronary artery without angina pectoris: I25.10

## 2014-08-13 HISTORY — DX: Ischemic cardiomyopathy: I25.5

## 2014-08-13 LAB — BASIC METABOLIC PANEL
Anion gap: 12 (ref 5–15)
BUN: 19 mg/dL (ref 6–23)
CO2: 22 mEq/L (ref 19–32)
Calcium: 8.8 mg/dL (ref 8.4–10.5)
Chloride: 108 mEq/L (ref 96–112)
Creatinine, Ser: 0.97 mg/dL (ref 0.50–1.10)
GFR calc Af Amer: 71 mL/min — ABNORMAL LOW (ref >90)
GFR calc non Af Amer: 61 mL/min — ABNORMAL LOW (ref >90)
Glucose, Bld: 126 mg/dL — ABNORMAL HIGH (ref 70–99)
Potassium: 4.6 mEq/L (ref 3.7–5.3)
Sodium: 142 mEq/L (ref 137–147)

## 2014-08-13 LAB — CBC
HCT: 45.8 % (ref 36.0–46.0)
Hemoglobin: 14.9 g/dL (ref 12.0–15.0)
MCH: 28.4 pg (ref 26.0–34.0)
MCHC: 32.5 g/dL (ref 30.0–36.0)
MCV: 87.2 fL (ref 78.0–100.0)
Platelets: 326 10*3/uL (ref 150–400)
RBC: 5.25 MIL/uL — ABNORMAL HIGH (ref 3.87–5.11)
RDW: 14.3 % (ref 11.5–15.5)
WBC: 11.8 10*3/uL — ABNORMAL HIGH (ref 4.0–10.5)

## 2014-08-13 LAB — I-STAT TROPONIN, ED: Troponin i, poc: 0 ng/mL (ref 0.00–0.08)

## 2014-08-13 LAB — PRO B NATRIURETIC PEPTIDE: Pro B Natriuretic peptide (BNP): 6562 pg/mL — ABNORMAL HIGH (ref 0–125)

## 2014-08-13 MED ORDER — CARVEDILOL 25 MG PO TABS
25.0000 mg | ORAL_TABLET | Freq: Two times a day (BID) | ORAL | Status: DC
  Administered 2014-08-13: 25 mg via ORAL
  Filled 2014-08-13 (×2): qty 1

## 2014-08-13 MED ORDER — SODIUM CHLORIDE 0.9 % IV SOLN: 250.0000 mL | INTRAVENOUS | Status: DC | PRN

## 2014-08-13 MED ORDER — SODIUM CHLORIDE 0.9 % IJ SOLN
3.0000 mL | Freq: Two times a day (BID) | INTRAMUSCULAR | Status: DC
Start: 2014-08-13 — End: 2014-08-17
  Administered 2014-08-13 – 2014-08-17 (×7): 3 mL via INTRAVENOUS

## 2014-08-13 MED ORDER — INFLUENZA VAC SPLIT QUAD 0.5 ML IM SUSY
0.5000 mL | PREFILLED_SYRINGE | INTRAMUSCULAR | Status: AC
  Administered 2014-08-15: 0.5 mL via INTRAMUSCULAR
  Filled 2014-08-13 (×2): qty 0.5

## 2014-08-13 MED ORDER — ONDANSETRON HCL 4 MG/2ML IJ SOLN
4.0000 mg | Freq: Four times a day (QID) | INTRAMUSCULAR | Status: DC | PRN
  Filled 2014-08-13: qty 2

## 2014-08-13 MED ORDER — ACETAMINOPHEN 325 MG PO TABS
650.0000 mg | ORAL_TABLET | ORAL | Status: DC | PRN
  Administered 2014-08-16: 650 mg via ORAL
  Filled 2014-08-13: qty 2

## 2014-08-13 MED ORDER — CARVEDILOL 6.25 MG PO TABS
6.2500 mg | ORAL_TABLET | Freq: Two times a day (BID) | ORAL | Status: DC
  Administered 2014-08-14 – 2014-08-17 (×7): 6.25 mg via ORAL
  Filled 2014-08-13 (×9): qty 1

## 2014-08-13 MED ORDER — FUROSEMIDE 10 MG/ML IJ SOLN
40.0000 mg | Freq: Two times a day (BID) | INTRAMUSCULAR | Status: DC
  Administered 2014-08-13 – 2014-08-14 (×2): 40 mg via INTRAVENOUS
  Filled 2014-08-13 (×4): qty 4

## 2014-08-13 MED ORDER — ATORVASTATIN CALCIUM 40 MG PO TABS
40.0000 mg | ORAL_TABLET | Freq: Every day | ORAL | Status: DC
  Administered 2014-08-14 – 2014-08-16 (×3): 40 mg via ORAL
  Filled 2014-08-13 (×4): qty 1

## 2014-08-13 MED ORDER — HEPARIN SODIUM (PORCINE) 5000 UNIT/ML IJ SOLN
5000.0000 [IU] | Freq: Three times a day (TID) | INTRAMUSCULAR | Status: DC
  Administered 2014-08-13 – 2014-08-17 (×12): 5000 [IU] via SUBCUTANEOUS
  Filled 2014-08-13 (×14): qty 1

## 2014-08-13 MED ORDER — FUROSEMIDE 10 MG/ML IJ SOLN
40.0000 mg | Freq: Once | INTRAMUSCULAR | Status: AC
  Administered 2014-08-13: 40 mg via INTRAVENOUS
  Filled 2014-08-13: qty 4

## 2014-08-13 MED ORDER — SODIUM CHLORIDE 0.9 % IJ SOLN: 3.0000 mL | INTRAMUSCULAR | Status: DC | PRN

## 2014-08-13 MED ORDER — NITROGLYCERIN 0.4 MG SL SUBL: 0.4000 mg | SUBLINGUAL_TABLET | SUBLINGUAL | Status: DC | PRN

## 2014-08-13 MED ORDER — PRASUGREL HCL 10 MG PO TABS
10.0000 mg | ORAL_TABLET | Freq: Every day | ORAL | Status: DC
  Administered 2014-08-16: 10 mg via ORAL
  Filled 2014-08-13 (×4): qty 1

## 2014-08-13 NOTE — ED Notes (Addendum)
Paged Dr. Tresa Endo regarding Admission orders and verified Lasix order for 40mg  IV. Patient updated.

## 2014-08-13 NOTE — H&P (Addendum)
Patient ID: Kendrah Kessinger MRN: [id number], DOB/AGE: 12/10/1950   Admit date: 08/13/2014   Primary Physician: No PCP Per Patient Primary Cardiologist: Dr. Swaziland  Pt. Profile:  63 year old African American female with past medical history significant for hypertension, hyperlipidemia, history of tobacco abuse, coronary artery disease status post anterior MI x2 and stents to LAD presented with HF symptom  Problem List  Past Medical History  Diagnosis Date  . GERD (gastroesophageal reflux disease)     Probable  . ST elevation (STEMI) myocardial infarction involving left anterior descending coronary artery 05/03/14; 05/09/14  . HTN (hypertension)   . Hyperlipidemia   . Tobacco use   . CAD (coronary artery disease), native coronary artery 04/2014    STEMI X 2, with stents to LAD   . Non compliance w medication regimen     Past Surgical History  Procedure Laterality Date  . Partial hysterectomy    . Cholecystectomy    . Coronary angioplasty with stent placement  05/03/14    STEMI- stent to LAD DES- Xience alpine  . Coronary angioplasty with stent placement  05/09/14    STEMI- overlapping stent to LAD, pt had missed a dose of Brilinta     Allergies  Allergies  Allergen Reactions  . Lactose Intolerance (Gi) Other (See Comments)    REACTION: stomach upset  . Sulfa Antibiotics Swelling  . Wheat Bran Other (See Comments)    REACTION: unknown    HPI  The patient is a 63 year old African American female with past medical history significant for hypertension, hyperlipidemia, history of tobacco abuse, coronary artery disease status post anterior MI x2 and stents to LAD. Patient initially presented as anterior STEMI on 05/03/2014. Cardiac catheterization that time showed a 95% proximal LAD stenosis, 30% proximal left circumflex stenosis, 20% mild ramus intermedius stenosis, 70-80% long segment RCA stenosis. Patient's ejection fraction was noted to be 35% that time. She was placed on  aspirin and Brilinta and eventually discharged on 05/03/2014. Patient came back to the [facility] hospital on the following week on 05/09/2014 with a recurrent anterior MI because she missed one day of her Brilinta. Cardiac catheterization done on 6/24 showed subacute stent thrombosis in mid LAD which was treated with overlapping drug-eluting stent at the distal edge of the previous stent. Her LVEDP was noted at 23 mmHg at the time. Patient was eventually discharged with ASA and effient to followup with Dr. Swaziland. Interestingly, her echocardiogram obtained on 6/25 showed EF 45-50%, although her ejection fraction was noted to be 35% on cardiac catheterization. She has been placed on carvedilol, and lisinopril, aspirin and Effient. Since her discharge, patient has been noting a productive cough that's been progressively getting worse. Since last last Friday, she has also noted to have significant lower extremity swelling as well. He denies any fever, chill, or dizziness. She has recurrent orthopnea and paroxysmal nocturnal dyspnea as well. She states her productive cough have white foamy phlegm.  Patient finally decided to seek medical attention at [facility] on 08/05/2014. She states she has not taken any of her medication this morning. Her blood pressure on arrival was noted to be 179 over 121. O2 saturation 96% on room air. Significant laboratory finding include proBNP of 6562. White blood cell count was mildly elevated at 11.8 glucose of 126. chest x-ray showed pleural effusion on the right with right base consolidation otherwise lungs clear. EKG noted normal sinus rhythm with heart rate 99, poor R wave progression in anterior leads  and Q waves in anterior leads as well.   Home Medications  Prior to Admission medications   Medication Sig Start Date End Date Taking? Authorizing Provider  atorvastatin (LIPITOR) 80 MG tablet Take 40 mg by mouth daily at 6 PM. 05/06/14  Yes Dwana Melena, PA-C    carvedilol (COREG) 12.5 MG tablet Take 6.25 mg by mouth daily.   Yes Historical Provider, MD  cetirizine (ZYRTEC) 10 MG tablet Take 10 mg by mouth daily as needed for allergies.   Yes Historical Provider, MD  nitroGLYCERIN (NITROSTAT) 0.4 MG SL tablet Place 0.4 mg under the tongue every 5 (five) minutes as needed for chest pain.   Yes Historical Provider, MD  OVER THE COUNTER MEDICATION Take 5 mLs by mouth once as needed (for nighttime congestion).   Yes Historical Provider, MD  prasugrel (EFFIENT) 10 MG TABS tablet Take 1 tablet (10 mg total) by mouth daily. 05/12/14  Yes Ok Anis, NP    Family History  Family History  Problem Relation Age of Onset  . Diabetes Mother   . Hypertension Mother     Social History  History   Social History  . Marital Status: Widowed    Spouse Name: N/A    Number of Children: N/A  . Years of Education: N/A   Occupational History  . not employed    Social History Main Topics  . Smoking status: Current Every Day Smoker -- 0.25 packs/day for 40 years    Types: Cigarettes  . Smokeless tobacco: Never Used     Comment: down to 3 cigarettes daily (08/03/14)  . Alcohol Use: No  . Drug Use: No  . Sexual Activity: Not on file   Other Topics Concern  . Not on file   Social History Narrative   Patient has 6 brothers and sisters and none have known coronary artery disease. She lives alone in [place], but has several brothers in the area.     Review of Systems General:  No chills, fever, night sweats or weight changes.  Cardiovascular:  No chest pain. +dyspnea on exertion, orthopnea, palpitations, paroxysmal nocturnal dyspnea. LE edema Dermatological: No rash, lesions/masses Respiratory: +productive cough, dyspnea Urologic: No hematuria, dysuria Abdominal:   No nausea, vomiting, diarrhea, bright red blood per rectum, melena, or hematemesis Neurologic:  No visual changes, wkns, changes in mental status. All other systems reviewed and are  otherwise negative except as noted above.  Physical Exam  Blood pressure 180/108, pulse 99, temperature 97.4 F (36.3 C), temperature source Oral, resp. rate 21, height 5\' 5"  (1.651 m), weight 210 lb (95.255 kg), SpO2 99.00%.  General: Pleasant, NAD Psych: Normal affect. Neuro: Alert and oriented X 3. Moves all extremities spontaneously. HEENT: Normal  Neck: Supple without bruits or JVD. Lungs:  Resp regular and unlabored. R basilar rale Heart: RRR no s3, s4, or murmurs. Abdomen: Soft, non-tender, non-distended, BS + x 4.  Extremities: No clubbing, cyanosis. DP/PT/Radials 2+ and equal bilaterally. 2+ edema  Labs  Troponin (Point of Care Test)  Recent Labs  08/13/14 1613  TROPIPOC 0.00   No results found for this basename: CKTOTAL, CKMB, TROPONINI,  in the last 72 hours Lab Results  Component Value Date   WBC 11.8* 08/13/2014   HGB 14.9 08/13/2014   HCT 45.8 08/13/2014   MCV 87.2 08/13/2014   PLT 326 08/13/2014     Recent Labs Lab 08/13/14 1528  NA 142  K 4.6  CL 108  CO2 22  BUN 19  CREATININE 0.97  CALCIUM 8.8  GLUCOSE 126*   Lab Results  Component Value Date   CHOL 209* 07/04/2014   HDL 48 07/04/2014   LDLCALC 127* 07/04/2014   TRIG 171* 07/04/2014   No results found for this basename: DDIMER     Radiology/Studies  Dg Chest 2 View  08/13/2014   CLINICAL DATA:  Difficulty breathing  EXAM: CHEST  2 VIEW  COMPARISON:  May 03, 2014  FINDINGS: There is a pleural effusion on the right with consolidation in the right base. Elsewhere lungs are clear. Heart is mildly enlarged with pulmonary vascularity within normal limits. No adenopathy. No bone lesions.  IMPRESSION: Pleural effusion on the right with right base consolidation. Elsewhere lungs clear. Mild cardiomegaly.   Electronically Signed   By: Bretta Bang M.D.   On: 08/13/2014 17:39    ECG  EKG noted normal sinus rhythm with heart rate 99, poor R wave progression in anterior leads and Q waves in anterior  leads as well.   Echocardiogram 05/10/2014  LV EF: 45% - 50%  ------------------------------------------------------------------- Indications: MI - acute 410.91.  ------------------------------------------------------------------- History: PMH: Coronary artery disease. Congestive heart failure. Risk factors: Current tobacco use. Hypertension.  ------------------------------------------------------------------- Study Conclusions  - Left ventricle: The endocardium is nor well visualized. Images sugges severe hypokinesis to akinesis of the mid inferoseptum, distal septum, mid and apical anteroseptum. E/e&'>17 suggestive of elevated LV filling pressures. The cavity size was normal. Systolic function was mildly reduced. The estimated ejection fraction was in the range of 45% to 50%.      ASSESSMENT AND PLAN  1. Acute on chronic systolic HF  - cath EF 35%, echo EF 6/25 EF 45-50%  - admit for IV diuresis, 40mg  IV lasix daily  - continue ASA, BB, effient, ACEI  - consider recheck echo as EF 45-50% unlikely to cause this degree of CHF  2. CAD s/p anterior STEMI x 2  - s/p 2 overlapping DES after 2 cath  3. Ischemic cardiomyopathy 4. Hypertension 5. Hyperlipidemia 6. history of tobacco abuse   Signed, , [name] 08/13/2014, 7:04 PM\   The patient was seen, examined and discussed with 08/15/2014, PA-C and I agree with the above.   63 year old female with recent anterior STEMI with early ISR for missed dose of Brillinta, s/p PCI x 2 into LAD who is coming with worsening SOB at rest, cough, orthopnea, PND and LE edema x 4 days. She is in heart failure on physical exam. Currently 210 lbs, was 205 lbs on office visit on 06/25/2014. BNP 6500. This is most probably due to uncontrolled hypertension.  We will repeat echo (the last showed LVEF 45-50% with elevated filling pressures), start aggressive diuresis with Lasix 40 mg iv BID. Her BP is uncontrolled, for now we will increase  carvedilol to 25 mg po bid.  ECG unchanged from prior, troponin negative.   08/25/2014 08/13/2014

## 2014-08-13 NOTE — Progress Notes (Signed)
Pt arrived to floor in NAD, A/Ox4 steady on feet. VSS. Pt oriented to room and floor.

## 2014-08-13 NOTE — ED Notes (Signed)
Pt states she started getting SOB and having swelling to bilateral feet and knees. All of this started Thursday. No  Hx of chf

## 2014-08-13 NOTE — ED Provider Notes (Signed)
CSN: [id number]     Arrival date & time 08/13/14  1515 History   First MD Initiated Contact with Patient 08/13/14 1634     Chief Complaint  Patient presents with  . Shortness of Breath  . Leg Swelling     (Consider location/radiation/quality/duration/timing/severity/associated sxs/prior Treatment) HPI 63 year old Ho presents with shortness of breath and leg swelling. She states this is been going on over the past 3 days. She's never had leg swelling like this. It is bilateral. She feels like she's had a cold for the past one month with mild shortness of breath but is significantly worsen over the past 3 days. No chest pain. She does have shortness of breath and coughing with lying flat. Has a mild dry cough otherwise. No fevers. 3 months ago she had a stent placed for a STEMI. She's never been diagnosed with CHF. After putting her legs up she feels like her leg swelling is somewhat improved but still present. Talking for prolonged time and minimal exertion causes her to be dyspneic more so than normal.  Past Medical History  Diagnosis Date  . GERD (gastroesophageal reflux disease)     Probable  . ST elevation (STEMI) myocardial infarction involving left anterior descending coronary artery 05/03/14; 05/09/14  . HTN (hypertension)   . Hyperlipidemia   . Tobacco use   . CAD (coronary artery disease), native coronary artery 04/2014    STEMI X 2, with stents to LAD   . Non compliance w medication regimen    Past Surgical History  Procedure Laterality Date  . Partial hysterectomy    . Cholecystectomy    . Coronary angioplasty with stent placement  05/03/14    STEMI- stent to LAD DES- Xience alpine  . Coronary angioplasty with stent placement  05/09/14    STEMI- overlapping stent to LAD, pt had missed a dose of Brilinta   No family history on file. History  Substance Use Topics  . Smoking status: Current Every Day Smoker -- 0.25 packs/day for 40 years    Types: Cigarettes  . Smokeless  tobacco: Never Used     Comment: down to 3 cigarettes daily (08/03/14)  . Alcohol Use: No   OB History   Grav Para Term Preterm Abortions TAB SAB Ect Mult Living                 Review of Systems  Constitutional: Negative for fever.  Respiratory: Positive for cough and shortness of breath.   Cardiovascular: Positive for leg swelling. Negative for chest pain.  Gastrointestinal: Negative for vomiting.  All other systems reviewed and are negative.     Allergies  Lactose intolerance (gi); Sulfa antibiotics; and Wheat bran  Home Medications   Prior to Admission medications   Medication Sig Start Date End Date Taking? Authorizing Provider  atorvastatin (LIPITOR) 80 MG tablet Take 40 mg by mouth daily at 6 PM. 05/06/14  Yes Dwana Melena, PA-C  carvedilol (COREG) 12.5 MG tablet Take 6.25 mg by mouth daily.   Yes Historical Provider, MD  cetirizine (ZYRTEC) 10 MG tablet Take 10 mg by mouth daily as needed for allergies.   Yes Historical Provider, MD  nitroGLYCERIN (NITROSTAT) 0.4 MG SL tablet Place 0.4 mg under the tongue every 5 (five) minutes as needed for chest pain.   Yes Historical Provider, MD  OVER THE COUNTER MEDICATION Take 5 mLs by mouth once as needed (for nighttime congestion).   Yes Historical Provider, MD  prasugrel (EFFIENT) 10 MG TABS  tablet Take 1 tablet (10 mg total) by mouth daily. 05/12/14  Yes Ok Anis, NP   BP 157/107  Pulse 102  Temp(Src) 97.4 F (36.3 C) (Oral)  Resp 26  Ht 5\' 5"  (1.651 m)  Wt 210 lb (95.255 kg)  BMI 34.95 kg/m2  SpO2 96% Physical Exam  Nursing note and vitals reviewed. Constitutional: She is oriented to person, place, and time. She appears well-developed and well-nourished. No distress.  HENT:  Head: Normocephalic and atraumatic.  Right Ear: External ear normal.  Left Ear: External ear normal.  Nose: Nose normal.  Eyes: Right eye exhibits no discharge. Left eye exhibits no discharge.  Cardiovascular: Normal rate, regular  rhythm and normal heart sounds.   Pulmonary/Chest: Effort normal. No accessory muscle usage. Not tachypneic. No respiratory distress. She has decreased breath sounds in the right lower field. She has rales in the right lower field.  Abdominal: Soft. There is no tenderness.  Musculoskeletal: She exhibits edema (bilateral pitting edema to mid lower legs).  Neurological: She is alert and oriented to person, place, and time.  Skin: Skin is warm and dry.    ED Course  Procedures (including critical care time) Labs Review Labs Reviewed  PRO B NATRIURETIC PEPTIDE - Abnormal; Notable for the following:    Pro B Natriuretic peptide (BNP) 6562.0 (*)    All other components within normal limits  BASIC METABOLIC PANEL - Abnormal; Notable for the following:    Glucose, Bld 126 (*)    GFR calc non Af Amer 61 (*)    GFR calc Af Amer 71 (*)    All other components within normal limits  CBC - Abnormal; Notable for the following:    WBC 11.Deborah (*)    RBC 5.25 (*)    All other components within normal limits  I-STAT TROPOININ, ED    Imaging Review Dg Chest 2 View  08/13/2014   CLINICAL DATA:  Difficulty breathing  EXAM: CHEST  2 VIEW  COMPARISON:  May 03, 2014  FINDINGS: There is a pleural effusion on the right with consolidation in the right base. Elsewhere lungs are clear. Heart is mildly enlarged with pulmonary vascularity within normal limits. No adenopathy. No bone lesions.  IMPRESSION: Pleural effusion on the right with right base consolidation. Elsewhere lungs clear. Mild cardiomegaly.   Electronically Signed   By: May 05, 2014 M.D.   On: 08/13/2014 17:39     EKG Interpretation   Date/Time:  Monday August 13 2014 15:29:22 EDT Ventricular Rate:  99 PR Interval:  154 QRS Duration: 74 QT Interval:  372 QTC Calculation: 477 R Axis:   -90 Text Interpretation:  Normal sinus rhythm Left axis deviation Low voltage  QRS Cannot rule out Anteroseptal infarct , age undetermined Abnormal ECG   ST elevation has resolved Confirmed by Feliberto Stockley  MD, Analiyah Lechuga ([id number]) on  08/13/2014 4:35:41 PM      MDM   Final diagnoses:  Acute congestive heart failure, unspecified congestive heart failure type  Pleural effusion    Patient with new acute congestive heart failure. She does have asymmetric pleural effusions, but no fever and her cough is improving. I does feel that pneumonia is less likely given her new-onset leg swelling and dyspnea. She does have orthopnea as well. Given IV Lasix and cardiology will be consulted for admission.    08/15/2014, MD 08/13/14 [phone]

## 2014-08-13 NOTE — ED Notes (Signed)
Dr. Tresa Endo re-paged regarding admission orders.

## 2014-08-14 DIAGNOSIS — I059 Rheumatic mitral valve disease, unspecified: Secondary | ICD-10-CM

## 2014-08-14 DIAGNOSIS — F17200 Nicotine dependence, unspecified, uncomplicated: Secondary | ICD-10-CM

## 2014-08-14 DIAGNOSIS — I5021 Acute systolic (congestive) heart failure: Secondary | ICD-10-CM

## 2014-08-14 DIAGNOSIS — I2510 Atherosclerotic heart disease of native coronary artery without angina pectoris: Secondary | ICD-10-CM

## 2014-08-14 LAB — BASIC METABOLIC PANEL
Anion gap: 14 (ref 5–15)
BUN: 20 mg/dL (ref 6–23)
CO2: 24 mEq/L (ref 19–32)
Calcium: 8.6 mg/dL (ref 8.4–10.5)
Chloride: 109 mEq/L (ref 96–112)
Creatinine, Ser: 1.11 mg/dL — ABNORMAL HIGH (ref 0.50–1.10)
GFR calc Af Amer: 60 mL/min — ABNORMAL LOW (ref >90)
GFR calc non Af Amer: 52 mL/min — ABNORMAL LOW (ref >90)
Glucose, Bld: 100 mg/dL — ABNORMAL HIGH (ref 70–99)
Potassium: 3.9 mEq/L (ref 3.7–5.3)
Sodium: 147 mEq/L (ref 137–147)

## 2014-08-14 LAB — TROPONIN I
Troponin I: <0.3 ng/mL (ref <0.30)
Troponin I: <0.3 ng/mL (ref <0.30)
Troponin I: <0.3 ng/mL (ref <0.30)

## 2014-08-14 MED ORDER — PERFLUTREN LIPID MICROSPHERE
1.0000 mL | INTRAVENOUS | Status: AC | PRN
  Filled 2014-08-14: qty 10

## 2014-08-14 MED ORDER — FUROSEMIDE 10 MG/ML IJ SOLN
40.0000 mg | Freq: Three times a day (TID) | INTRAMUSCULAR | Status: AC
  Administered 2014-08-14 – 2014-08-15 (×3): 40 mg via INTRAVENOUS
  Filled 2014-08-14: qty 4

## 2014-08-14 MED ORDER — POTASSIUM CHLORIDE CRYS ER 20 MEQ PO TBCR
20.0000 meq | EXTENDED_RELEASE_TABLET | Freq: Two times a day (BID) | ORAL | Status: AC
  Administered 2014-08-14 (×2): 20 meq via ORAL
  Filled 2014-08-14 (×2): qty 1

## 2014-08-14 NOTE — Progress Notes (Addendum)
  Echocardiogram 2D Echocardiogram with Definity has been performed.  Deborah Ho 08/14/2014, 12:01 PM

## 2014-08-14 NOTE — Progress Notes (Addendum)
The patient has developed acute on chronic systolic heart failure.  Today's echo is pending. There is no chest pain and the patient has known severe RCA disease that has not been treated. There is no evidence of MI on this occasion. She notes a gradual increase in cough, edema, and dyspnea. Diuresis has helped but still far from desired baseline. Suspect salt loading via diet and absence of diuretic therapy as cause of CHF. Doubt an ischemic component. Our goal is diuresis to best symptom state and follow renal function for evidence of injury. She will need chronic diuretic therapy and should not go home without it as part of regimen. ontinue IV lasix at least another 24 hours. There may be an issue with Effient but she had stent thrombosis on Brilinta. Am not inclined to use plavix given h/o AST.

## 2014-08-14 NOTE — Progress Notes (Signed)
UR complete.  Vanecia Limpert RN, MSN 

## 2014-08-14 NOTE — Plan of Care (Signed)
Problem: Phase I Progression Outcomes Goal: EF % per last Echo/documented,Core Reminder form on chart Outcome: Completed/Met Date Met:  08/14/14 EF 15% per echo on 08/14/14

## 2014-08-14 NOTE — Progress Notes (Signed)
Patient Name: Deborah Ho Date of Encounter: 08/14/2014     Active Problems:   Acute systolic CHF (congestive heart failure), NYHA class 3   Ischemic cardiomyopathy   Acute systolic heart failure    SUBJECTIVE  Body hurting all over. Seen in ECHO lab. Breathing is better. No CP. Complains of swelling in her mouth/tounge  After taking her daily chewable ASA and Effient.  Still some LE swelling and coughing when supine.   CURRENT MEDS . atorvastatin  40 mg Oral q1800  . carvedilol  6.25 mg Oral BID WC  . furosemide  40 mg Intravenous BID  . heparin  5,000 Units Subcutaneous 3 times per day  . Influenza vac split quadrivalent PF  0.5 mL Intramuscular Tomorrow-1000  . prasugrel  10 mg Oral Daily  . sodium chloride  3 mL Intravenous Q12H    OBJECTIVE  Filed Vitals:   08/13/14 2215 08/13/14 2230 08/13/14 2255 08/14/14 0422  BP: 116/66 121/71 119/90 128/80  Pulse: 90 91 91 85  Temp:  98.2 F (36.8 C) 97.6 F (36.4 C) 97.5 F (36.4 C)  TempSrc:  Oral Oral Oral  Resp: 23 22 18 18   Height:   5\' 5"  (1.651 m)   Weight:   212 lb 12.8 oz (96.525 kg)   SpO2: 98% 97% 99% 99%    Intake/Output Summary (Last 24 hours) at 08/14/14 1034 Last data filed at 08/14/14 0928  Gross per 24 hour  Intake    513 ml  Output   2600 ml  Net  -2087 ml   Filed Weights   08/13/14 1524 08/13/14 2255  Weight: 210 lb (95.255 kg) 212 lb 12.8 oz (96.525 kg)    PHYSICAL EXAM  General: Pleasant, NAD  Psych: Normal affect.  Neuro: Alert and oriented X 3. Moves all extremities spontaneously.  HEENT: Normal  Neck: Supple without bruits or JVD.  Lungs: Resp regular and unlabored. R basilar rale  Heart: RRR no s3, s4, or murmurs.  Abdomen: Soft, non-tender, non-distended, BS + x 4.  Extremities: No clubbing, cyanosis. DP/PT/Radials 2+ and equal bilaterally. 1+ edema   Accessory Clinical Findings  CBC  Recent Labs  08/13/14 1528  WBC 11.8*  HGB 14.9  HCT 45.8  MCV 87.2  PLT 326    Basic Metabolic Panel  Recent Labs  08/13/14 1528 08/14/14 0505  NA 142 147  K 4.6 3.9  CL 108 109  CO2 22 24  GLUCOSE 126* 100*  BUN 19 20  CREATININE 0.97 1.11*  CALCIUM 8.8 8.6   Cardiac Enzymes  Recent Labs  08/13/14 2350 08/14/14 0505  TROPONINI <0.30 <0.30     TELE  NSR  Radiology/Studies  Dg Chest 2 View  08/13/2014   CLINICAL DATA:  Difficulty breathing  EXAM: CHEST  2 VIEW  COMPARISON:  May 03, 2014  FINDINGS: There is a pleural effusion on the right with consolidation in the right base. Elsewhere lungs are clear. Heart is mildly enlarged with pulmonary vascularity within normal limits. No adenopathy. No bone lesions.  IMPRESSION: Pleural effusion on the right with right base consolidation. Elsewhere lungs clear. Mild cardiomegaly.   Electronically Signed   By: 08/15/2014 M.D.   On: 08/13/2014 17:39    ASSESSMENT AND PLAN  63 year old 08/15/2014 American female with past medical history significant for HTN, HLD,history of tobacco abuse, CAD s/p recent anterior STEMI with early ISR for missed dose of Brillinta s/p PCI x 2 into LAD  (04/2014) and ischemic CM (  EF 45-50%)  who presented to the [facility] yseter[facility] with HF symptoms and uncontrolled HTN  ICM/Acute on chronic systolic HF - thought to be due to uncontrolled HTN -- Cath EF 35%, echo 6/25 EF 45-50%. BNP elevated @ 6562. CXR with pleural effusion on the right with right base consolidation. Weight currently 212 lbs, which is up from 205 lbs on office visit on 06/25/2014 -- Placed on IV Lasix 40mg  BID. Net neg 2L. Creat slighly bumped 0.97--> 1.11. Continue to monitor. Still volume overloaded. Continue diuresis. -- Repeat ECHO pending.   CAD --s/p recent anterior STEMI with early ISR for missed dose of Brillinta s/p PCI x 2 into LAD (04/2014) -- Brillinta switched to effient -- ECG unchanged from prior, troponin negative x2.  -- Continue ASA, Effient, BB and statin. Complains of swelling in her tounge when  she takes ASA/Effient for the past couple months that has been getting progressively worse. Will hold this until MD clears it to be taken  Hypertension- presented with uncontrolled BPs (179/121) -- Coreg increased to 25mg  po bid. Now much better control   Hyperlipidemia - continue statin   Signed, [birth year]-15-1983 PA-C  Pager 

## 2014-08-15 ENCOUNTER — Ambulatory Visit (HOSPITAL_COMMUNITY): Payer: No Typology Code available for payment source

## 2014-08-15 LAB — BASIC METABOLIC PANEL
Anion gap: 13 (ref 5–15)
BUN: 24 mg/dL — ABNORMAL HIGH (ref 6–23)
CO2: 27 mEq/L (ref 19–32)
Calcium: 8.7 mg/dL (ref 8.4–10.5)
Chloride: 105 mEq/L (ref 96–112)
Creatinine, Ser: 1.26 mg/dL — ABNORMAL HIGH (ref 0.50–1.10)
GFR calc Af Amer: 52 mL/min — ABNORMAL LOW (ref >90)
GFR calc non Af Amer: 45 mL/min — ABNORMAL LOW (ref >90)
Glucose, Bld: 90 mg/dL (ref 70–99)
Potassium: 3.4 mEq/L — ABNORMAL LOW (ref 3.7–5.3)
Sodium: 145 mEq/L (ref 137–147)

## 2014-08-15 MED ORDER — ISOSORB DINITRATE-HYDRALAZINE 20-37.5 MG PO TABS
1.0000 | ORAL_TABLET | Freq: Two times a day (BID) | ORAL | Status: DC
Start: 2014-08-15 — End: 2014-08-17
  Administered 2014-08-15 – 2014-08-17 (×5): 1 via ORAL
  Filled 2014-08-15 (×6): qty 1

## 2014-08-15 MED ORDER — FUROSEMIDE 10 MG/ML IJ SOLN
40.0000 mg | Freq: Three times a day (TID) | INTRAMUSCULAR | Status: AC
  Administered 2014-08-15 – 2014-08-16 (×3): 40 mg via INTRAVENOUS
  Filled 2014-08-15: qty 4

## 2014-08-15 MED ORDER — POTASSIUM CHLORIDE CRYS ER 20 MEQ PO TBCR
20.0000 meq | EXTENDED_RELEASE_TABLET | Freq: Three times a day (TID) | ORAL | Status: AC
  Administered 2014-08-15 – 2014-08-16 (×3): 20 meq via ORAL
  Filled 2014-08-15 (×3): qty 1

## 2014-08-15 MED FILL — Perflutren Lipid Microsphere IV Susp 1.1 MG/ML: INTRAVENOUS | Qty: 10 | Status: AC

## 2014-08-15 NOTE — Progress Notes (Signed)
The patient was interviewed and examined. There has been a significant decline and LVEF to 15%. I ordered 3 doses of IV Lasix yesterday and there was significant diuresis. She is still volume overloaded.  The entire story around possible angioedema makes management difficult. She has had stent thrombosis and currently is not on aspirin. Whether or not she had any difficulty with ACE/ARB therapy is difficult for me to determine. I would not start angiotensin blockade until she is fully diuresed to avoid acute renal impairment. For the time being we will use Bidil tolerated by blood pressure.  Overall prognosis is guarded. A Life Vest is a consideration although she is 3 months post her most recent ischemic event.

## 2014-08-15 NOTE — Progress Notes (Signed)
Patient Profile: 64 year old African American female with past medical history significant for HTN, HLD,history of tobacco abuse, CAD s/p recent anterior STEMI with early in-stent thorombosis for missed dose of Brillinta s/p PCI x 2 into LAD (04/2014) and ischemic CM (EF 45-50%) who presented to the [facility] 08/13/14 with HF symptoms and uncontrolled HTN.  Note: Repeat 2D echo 08/14/14 with EF down at 15%.   Subjective: Resting dyspnea has improved. She had not ambulated far enough to r/o DOE. Still with mild orthopnea and cough.   Objective: Vital signs in last 24 hours: Temp:  [97.6 F (36.4 C)-98.5 F (36.9 C)] 97.9 F (36.6 C) (09/30 0502) Pulse Rate:  [82-87] 87 (09/30 0950) Resp:  [16-20] 16 (09/30 0502) BP: (124-139)/(73-91) 124/73 mmHg (09/30 0950) SpO2:  [95 %-97 %] 96 % (09/30 0502) Weight:  [210 lb 3.2 oz (95.346 kg)] 210 lb 3.2 oz (95.346 kg) (09/30 0502) Last BM Date: 08/13/14  Intake/Output from previous day: 09/29 0701 - 09/30 0700 In: 1203 [P.O.:1200; I.V.:3] Out: 2850 [Urine:2850] Intake/Output this shift: Total I/O In: 240 [P.O.:240] Out: -   Medications Current Facility-Administered Medications  Medication Dose Route Frequency Provider Last Rate Last Dose  . 0.9 %  sodium chloride infusion  250 mL Intravenous PRN [name], PA      . acetaminophen (TYLENOL) tablet 650 mg  650 mg Oral Q4H PRN [name], PA      . atorvastatin (LIPITOR) tablet 40 mg  40 mg Oral q1800 [name], PA   40 mg at 08/14/14 1735  . carvedilol (COREG) tablet 6.25 mg  6.25 mg Oral BID WC Leeann Must, MD   6.25 mg at 08/15/14 0951  . heparin injection 5,000 Units  5,000 Units Subcutaneous 3 times per day [name], PA   5,000 Units at 08/15/14 0505  . nitroGLYCERIN (NITROSTAT) SL tablet 0.4 mg  0.4 mg Sublingual Q5 min PRN [name], PA      . ondansetron [facility]) injection 4 mg  4 mg Intravenous Q6H PRN [name], PA      . prasugrel (EFFIENT) tablet 10 mg  10 mg Oral Daily [name], [place]      .  sodium chloride 0.9 % injection 3 mL  3 mL Intravenous Q12H [name], PA   3 mL at 08/15/14 0955  . sodium chloride 0.9 % injection 3 mL  3 mL Intravenous PRN [name], PA        PE: General appearance: alert, cooperative, no distress and moderately obese Neck: no JVD Lungs: basilar crackles on the right Heart: regular rate and rhythm Extremities: 3+ bilateral LEE Pulses: 2+ and symmetric Skin: wrm and dry Neurologic: Grossly normal  Lab Results:   Recent Labs  08/13/14 1528  WBC 11.8*  HGB 14.9  HCT 45.8  PLT 326   BMET  Recent Labs  08/13/14 1528 08/14/14 0505 08/15/14 0520  NA 142 147 145  K 4.6 3.9 3.4*  CL 108 109 105  CO2 22 24 27   GLUCOSE 126* 100* 90  BUN 19 20 24*  CREATININE 0.97 1.11* 1.26*  CALCIUM 8.8 8.6 8.7    Studies/Results:  2D echo 08/14/14  Study Conclusions  - Procedure narrative: Transthoracic echocardiography. Image quality was adequate. The study was technically difficult, as a result of poor patient compliance. Intravenous contrast (Definity) was administered. - Left ventricle: The cavity size was normal. Wall thickness was increased in a pattern of moderate LVH. The estimated ejection fraction was 15%. LV apex  well visualized with contrast - no thrombus. There is a prominent apical false tendon. Doppler parameters are consistent with restrictive left ventricular relaxation (grade 3 diastolic dysfunction). The E/e&' ratio is >15, suggesting elevated LV Filling pressure. - Mitral valve: There was moderate regurgitation. - Right ventricle: The cavity size was normal. Systolic function is reduced, particularly at the apex. - Right atrium: Moderately dilated (24 cm2). - Tricuspid valve: There was moderate regurgitation. - Pulmonary arteries: PA peak pressure: 40 mm Hg (S). - Inferior vena cava: The vessel was dilated. The respirophasic diameter changes were blunted (< 50%), consistent with elevated central venous  pressure.  Impressions:  - Compared to the prior study on 05/10/14, the EF has fallen from 40% to 15% with severe global hypokinesis. There is moderate MR and TR with elevated left and right-sided filling pressures and restrictive diastolic physiology. Definity contrast did not demonstrate an apical thrombus.    Assessment/Plan  Active Problems:   Acute systolic CHF (congestive heart failure), NYHA class 3   Ischemic cardiomyopathy   Acute systolic heart failure  1. Acute Systolic CHF: still significantly volume overloaded. She is diuresising well. 1.6 L in past 24 hr and 3L total. Scr slightly up but stable. Continue IV Lasix. Strict I/Os, daily weights and low sodium diet. Continue BB. She needs to be on an ACE/ARB and/or nitrate + hydralazine therapy.   2. Ischemic Cardiomyopathy: EF 15% (down from 45-50%). Continue Coreg BID. BP is stable in the 120s systolic. She is currently not on an ACE or ARB. She reports a recent allergic reaction to ASA with lip and tongue swelling. ? If this was angioedema and if ACE/ARB therapy should be avoided. If high concern for angioedema, can place on Bidil for afterload reduction in place of ACE/ARB.   3. CAD: Denies CP. Continue Effient w/o ASA (concern for ASA allergy).       LOS: 2 days    Asya Derryberry M. Sharol Harness, PA-C 08/15/2014 11:01 AM ,s

## 2014-08-15 NOTE — Evaluation (Signed)
Physical Therapy Evaluation/Discharge Patient Details Name: Deborah Ho MRN: [id number] DOB: [birth year] Today's Date: 08/15/2014   History of Present Illness  63 year old African American female with past medical history significant for HTN, HLD,history of tobacco abuse, CAD s/p recent anterior STEMI with early in-stent thorombosis for missed dose of Brillinta s/p PCI x 2 into LAD (04/2014) and ischemic CM (EF 45-50%) who presented to the [facility] 08/13/14 with HF symptoms and uncontrolled HTN.  Clinical Impression  Patient evaluated by Physical Therapy with no further acute PT needs identified. All education has been completed and the patient has no further questions. See below for any follow-up Physial Therapy or equipment needs. PT is signing off. Thank you for this referral.    Follow Up Recommendations No PT follow up    Equipment Recommendations  None recommended by PT    Recommendations for Other Services       Precautions / Restrictions Precautions Precautions: None Restrictions Weight Bearing Restrictions: No      Mobility  Bed Mobility               General bed mobility comments: no assessed, however anticipate pt. independent with bed mobility due to other mobility status  Transfers Overall transfer level: Independent Equipment used: None                Ambulation/Gait Ambulation/Gait assistance: Independent Ambulation Distance (Feet): 500 Feet Assistive device: None Gait Pattern/deviations: WFL(Within Functional Limits) Gait velocity: WFL Gait velocity interpretation: at or above normal speed for age/gender    Stairs            Wheelchair Mobility    Modified Rankin (Stroke Patients Only)       Balance Overall balance assessment: Independent;No apparent balance deficits (not formally assessed)                                           Pertinent Vitals/Pain Pain Assessment: No/denies pain    Home Living  Family/patient expects to be discharged to:: Private residence Living Arrangements: Alone Available Help at Discharge: Family;Friend(s);Available PRN/intermittently Type of Home: Apartment (3rd floor) Home Access: Level entry;Elevator     Home Layout: One level Home Equipment: Grab bars - toilet;Grab bars - tub/shower (emergency pulls in rooms)      Prior Function Level of Independence: Independent               Hand Dominance   Dominant Hand: Right    Extremity/Trunk Assessment   Upper Extremity Assessment: Overall WFL for tasks assessed           Lower Extremity Assessment: Overall WFL for tasks assessed      Cervical / Trunk Assessment: Normal  Communication   Communication: No difficulties  Cognition Arousal/Alertness: Awake/alert Behavior During Therapy: WFL for tasks assessed/performed Overall Cognitive Status: Within Functional Limits for tasks assessed                      General Comments      Exercises        Assessment/Plan    PT Assessment Patent does not need any further PT services  PT Diagnosis     PT Problem List    PT Treatment Interventions     PT Goals (Current goals can be found in the Care Plan section) Acute Rehab PT Goals Patient Stated Goal: to go home PT  Goal Formulation: No goals set, d/c therapy    Frequency     Barriers to discharge        Co-evaluation               End of Session Equipment Utilized During Treatment: Gait belt Activity Tolerance: Patient tolerated treatment well Patient left: in chair;with call bell/phone within reach Nurse Communication: Mobility status         Time: 1610-9604 PT Time Calculation (min): 17 min   Charges:   PT Evaluation $Initial PT Evaluation Tier I: 1 Procedure PT Treatments $Gait Training: 8-22 mins   PT G CodesErnestene Mention 08/15/2014, 2:29 PM  Clarita Crane, PT, DPT [phone]

## 2014-08-15 NOTE — Care Management Note (Signed)
    Page 1 of 1   08/15/2014     11:15:01 AM CARE MANAGEMENT NOTE 08/15/2014  Patient:  [employer]   Account Number:  [id number]  Date Initiated:  08/14/2014  Documentation initiated by:  Elmer Bales  Subjective/Objective Assessment:   Patient was admitted with acute on chronic systolic heart failure. Lives at home alone.     Action/Plan:   Will follow for discharge needs.   Anticipated DC Date:     Anticipated DC Plan:  HOME W HOME HEALTH SERVICES      DC Planning Services  CM consult      Choice offered to / List presented to:             Status of service:  In process, will continue to follow Medicare Important Message given?   (If response is "NO", the following Medicare IM given date fields will be blank) Date Medicare IM given:   Medicare IM given by:   Date Additional Medicare IM given:   Additional Medicare IM given by:    Discharge Disposition:    Per UR Regulation:  Reviewed for med. necessity/level of care/duration of stay  If discussed at Long Length of Stay Meetings, dates discussed:    Comments:  Crystal Hutchinson RN, BSN, MSHL, CCM  Nurse - Case Manager,  (Unit [place])  [phone]  08/15/2014 Hx/o 3 admissions and 0 ER viisits over past 6 months New CH, HTN mgmt EF 15% Social:  From home alone Appt scheduled for 11/05/2014 10:30 am at [facility] Office  Rosalio Macadamia, NP Dispo Plan:  pending -  (new CHF

## 2014-08-15 NOTE — Progress Notes (Signed)
Heart Failure Navigator Consult Note  Presentation: Deborah Ho is a 63 year old African American female with past medical history significant for HTN, HLD,history of tobacco abuse, CAD s/p recent anterior STEMI with early ISR for missed dose of Brillinta s/p PCI x 2 into LAD (04/2014) and ischemic CM (EF 45-50%) who presented to the [facility] with HF symptoms and uncontrolled HTN.  Past Medical History  Diagnosis Date  . GERD (gastroesophageal reflux disease)     Probable  . ST elevation (STEMI) myocardial infarction involving left anterior descending coronary artery 05/03/14; 05/09/14  . HTN (hypertension)   . Hyperlipidemia   . Tobacco use   . CAD (coronary artery disease), native coronary artery 04/2014    STEMI X 2, with stents to LAD   . Non compliance w medication regimen     History   Social History  . Marital Status: Widowed    Spouse Name: N/A    Number of Children: N/A  . Years of Education: N/A   Occupational History  . not employed    Social History Main Topics  . Smoking status: Current Every Day Smoker -- 0.10 packs/day for .5 years    Types: Cigarettes  . Smokeless tobacco: Never Used     Comment: down to 3 cigarettes daily (08/03/14)  . Alcohol Use: No  . Drug Use: No  . Sexual Activity: None   Other Topics Concern  . None   Social History Narrative   Patient has 6 brothers and sisters and none have known coronary artery disease. She lives alone in [place], but has several brothers in the area.    ECHO:Study Conclusions--08/14/14  - Procedure narrative: Transthoracic echocardiography. Image quality was adequate. The study was technically difficult, as a result of poor patient compliance. Intravenous contrast (Definity) was administered. - Left ventricle: The cavity size was normal. Wall thickness was increased in a pattern of moderate LVH. The estimated ejection fraction was 15%. LV apex well visualized with contrast - no thrombus. There is a  prominent apical false tendon. Doppler parameters are consistent with restrictive left ventricular relaxation (grade 3 diastolic dysfunction). The E/e&' ratio is >15, suggesting elevated LV Filling pressure. - Mitral valve: There was moderate regurgitation. - Right ventricle: The cavity size was normal. Systolic function is reduced, particularly at the apex. - Right atrium: Moderately dilated (24 cm2). - Tricuspid valve: There was moderate regurgitation. - Pulmonary arteries: PA peak pressure: 40 mm Hg (S). - Inferior vena cava: The vessel was dilated. The respirophasic diameter changes were blunted (< 50%), consistent with elevated central venous pressure.  Impressions:  - Compared to the prior study on 05/10/14, the EF has fallen from 40% to 15% with severe global hypokinesis. There is moderate MR and TR with elevated left and right-sided filling pressures and restrictive diastolic physiology. Definity contrast did not demonstrate an apical thrombus.  Transthoracic echocardiography. M-mode, complete 2D, spectral Doppler, and color Doppler. Birthdate: Patient birthdate: [birth year]. Age: Patient is 62 yr old. Sex: Gender: female. BMI: 35.4 kg/m^2. Blood pressure: 128/80 Patient status: Inpatient. Study date: Study date: 08/14/2014. Study time: 10:47 AM. Location: Echo laboratory.   BNP    Component Value Date/Time   PROBNP 6562.0* 08/13/2014 1528    Education Assessment and Provision:  Detailed education and instructions provided on heart failure disease management including the following:  Signs and symptoms of Heart Failure When to call the physician Importance of daily weights Low sodium diet Fluid restriction Medication management Anticipated future follow-up appointments  Patient education  given on each of the above topics.  Patient acknowledges understanding and acceptance of all instructions.  I spoke for some time with Deborah Ho about her HF.  She has not  spoken with the physician yet regarding her most recent echo results.  She does admit to eating canned soup and bologna sandwiches as well as fried fish --all last week--however also says that she eats "a low sodium diet ".  She also says that she drinks "a lot" of fluids.  I have reviewed with her a low sodium diet, fluid restriction and all topics listed above.  She lives alone, still works and has been very active.  She has family close by for support if needed.  Education Materials:  "Living Better With Heart Failure" Booklet, Daily Weight Tracker Tool and Heart Failure Educational Video.   High Risk Criteria for Readmission and/or Poor Patient Outcomes:   EF <30%- Yes --newly decreased to 15%  2 or more admissions in 6 months- Yes  Difficult social situation- No  Demonstrates medication noncompliance- No    Barriers of Care:  Knowledge, compliance  Discharge Planning:  Plans to discharge to home alone.  May benefit from [facility] for symptom recognition as well as outpatient Cardiac Rehab if she can do it with her schedule.

## 2014-08-16 DIAGNOSIS — I5021 Acute systolic (congestive) heart failure: Secondary | ICD-10-CM

## 2014-08-16 DIAGNOSIS — I10 Essential (primary) hypertension: Secondary | ICD-10-CM

## 2014-08-16 DIAGNOSIS — I2510 Atherosclerotic heart disease of native coronary artery without angina pectoris: Secondary | ICD-10-CM

## 2014-08-16 LAB — BASIC METABOLIC PANEL
Anion gap: 14 (ref 5–15)
BUN: 25 mg/dL — ABNORMAL HIGH (ref 6–23)
CO2: 27 mEq/L (ref 19–32)
Calcium: 8.3 mg/dL — ABNORMAL LOW (ref 8.4–10.5)
Chloride: 102 mEq/L (ref 96–112)
Creatinine, Ser: 1.16 mg/dL — ABNORMAL HIGH (ref 0.50–1.10)
GFR calc Af Amer: 57 mL/min — ABNORMAL LOW (ref >90)
GFR calc non Af Amer: 49 mL/min — ABNORMAL LOW (ref >90)
Glucose, Bld: 85 mg/dL (ref 70–99)
Potassium: 3.7 mEq/L (ref 3.7–5.3)
Sodium: 143 mEq/L (ref 137–147)

## 2014-08-16 MED ORDER — FUROSEMIDE 40 MG PO TABS: 40.0000 mg | ORAL_TABLET | Freq: Two times a day (BID) | ORAL | Status: DC

## 2014-08-16 MED ORDER — FUROSEMIDE 40 MG PO TABS
40.0000 mg | ORAL_TABLET | Freq: Every day | ORAL | Status: DC
  Administered 2014-08-17: 40 mg via ORAL
  Filled 2014-08-16: qty 1

## 2014-08-16 MED ORDER — SPIRONOLACTONE 25 MG PO TABS
25.0000 mg | ORAL_TABLET | Freq: Every day | ORAL | Status: DC
  Administered 2014-08-16 – 2014-08-17 (×2): 25 mg via ORAL
  Filled 2014-08-16 (×2): qty 1

## 2014-08-16 NOTE — Progress Notes (Signed)
Pt was given Effient this am, and had a mild reaction.  Symptoms were mild facial swelling, dizziness, nausea, and weakness.  I have contacted [employer], who will work with pharmacist to change to an alternate medication.

## 2014-08-16 NOTE — Progress Notes (Signed)
  Case discussed with HF Navigator. Chart reviewed. Patient felt to be at increased risk for HF readmission due to low EF and comorbiditites. We would be happy to follow in HF Clinic to titrate meds as tolerated.  Please call [phone] at discharge to schedule appt with HF Clinic, if desired.   Cynthis Purington,MD 4:09 PM

## 2014-08-16 NOTE — Progress Notes (Addendum)
The patient feels much better and is asking to go home. We'll not optimizing therapy for heart failure. We are not using ARB/ACE therapy because of the possibility of angioedema in the past. Will add aldactone to furosemide. We will switch her diuretic therapy to oral dosing. We requested that she ambulate. We initiated Life Vest therapy. We explained the rationale to the patient. Plan to discharge within the next 24-48 hours depending upon her clinical response.

## 2014-08-16 NOTE — Progress Notes (Signed)
Patient Profile: 63 year old African American female with past medical history significant for HTN, HLD,history of tobacco abuse, CAD s/p recent anterior STEMI with early in-stent thorombosis for missed dose of Brillinta s/p PCI x 2 into LAD (04/2014) and ischemic CM (EF 45-50%) who presented to the [facility] 08/13/14 with HF symptoms and uncontrolled HTN.  Note: Repeat 2D echo 08/14/14 with EF down at 15%.   Subjective: Dyspnea and orthopnea have improved. LE swelling better but still with edema.   Objective: Vital signs in last 24 hours: Temp:  [97.5 F (36.4 C)-98.5 F (36.9 C)] 97.8 F (36.6 C) (10/01 0514) Pulse Rate:  [83-89] 88 (10/01 0514) Resp:  [18-20] 18 (10/01 0514) BP: (108-141)/(60-82) 141/82 mmHg (10/01 0514) SpO2:  [97 %-99 %] 99 % (10/01 0514) Weight:  [206 lb 12.7 oz (93.8 kg)] 206 lb 12.7 oz (93.8 kg) (10/01 0514) Last BM Date: 08/15/14  Intake/Output from previous day: 09/30 0701 - 10/01 0700 In: 960 [P.O.:960] Out: 2225 [Urine:2225] Intake/Output this shift:    Medications Current Facility-Administered Medications  Medication Dose Route Frequency Provider Last Rate Last Dose  . 0.9 %  sodium chloride infusion  250 mL Intravenous PRN [name], PA      . acetaminophen (TYLENOL) tablet 650 mg  650 mg Oral Q4H PRN [name], PA      . atorvastatin (LIPITOR) tablet 40 mg  40 mg Oral q1800 [name], PA   40 mg at 08/15/14 1842  . carvedilol (COREG) tablet 6.25 mg  6.25 mg Oral BID WC Leeann Must, MD   6.25 mg at 08/15/14 1551  . furosemide (LASIX) injection 40 mg  40 mg Intravenous TID Lyn Records III, MD   40 mg at 08/15/14 2113  . heparin injection 5,000 Units  5,000 Units Subcutaneous 3 times per day [name], PA   5,000 Units at 08/16/14 0505  . isosorbide-hydrALAZINE (BIDIL) 20-37.5 MG per tablet 1 tablet  1 tablet Oral BID Lyn Records III, MD   1 tablet at 08/15/14 2112  . nitroGLYCERIN (NITROSTAT) SL tablet 0.4 mg  0.4 mg Sublingual Q5 min PRN [name], PA       . ondansetron [facility]) injection 4 mg  4 mg Intravenous Q6H PRN [name], PA      . potassium chloride SA (K-DUR,KLOR-CON) CR tablet 20 mEq  20 mEq Oral TID Lyn Records III, MD   20 mEq at 08/15/14 2112  . prasugrel (EFFIENT) tablet 10 mg  10 mg Oral Daily [name], [place]      . sodium chloride 0.9 % injection 3 mL  3 mL Intravenous Q12H [name], PA   3 mL at 08/15/14 2114  . sodium chloride 0.9 % injection 3 mL  3 mL Intravenous PRN [name], PA        PE: General appearance: alert, cooperative, no distress and moderately obese Neck: no JVD Lungs: basilar crackles on the right Heart: regular rate and rhythm Extremities: 3+ bilateral LEE Pulses: 2+ and symmetric Skin: wrm and dry Neurologic: Grossly normal  Lab Results:   Recent Labs  08/13/14 1528  WBC 11.8*  HGB 14.9  HCT 45.8  PLT 326   BMET  Recent Labs  08/14/14 0505 08/15/14 0520 08/16/14 0505  NA 147 145 143  K 3.9 3.4* 3.7  CL 109 105 102  CO2 24 27 27   GLUCOSE 100* 90 85  BUN 20 24* 25*  CREATININE 1.11* 1.26* 1.16*  CALCIUM 8.6 8.7 8.3*  Studies/Results:  2D echo 08/14/14  Study Conclusions - Procedure narrative: Transthoracic echocardiography. Image quality was adequate. The study was technically difficult, as a result of poor patient compliance. Intravenous contrast (Definity) was administered. - Left ventricle: The cavity size was normal. Wall thickness was increased in a pattern of moderate LVH. The estimated ejection fraction was 15%. LV apex well visualized with contrast - no thrombus. There is a prominent apical false tendon. Doppler parameters are consistent with restrictive left ventricular relaxation (grade 3 diastolic dysfunction). The E/e&' ratio is >15, suggesting elevated LV Filling pressure. - Mitral valve: There was moderate regurgitation. - Right ventricle: The cavity size was normal. Systolic function is reduced, particularly at the apex. - Right atrium: Moderately dilated  (24 cm2). - Tricuspid valve: There was moderate regurgitation. - Pulmonary arteries: PA peak pressure: 40 mm Hg (S). - Inferior vena cava: The vessel was dilated. The respirophasic diameter changes were blunted (< 50%), consistent with elevated central venous pressure. Impressions: - Compared to the prior study on 05/10/14, the EF has fallen from 40% to 15% with severe global hypokinesis. There is moderate MR and TR with elevated left and right-sided filling pressures and restrictive diastolic physiology. Definity contrast did not demonstrate an apical thrombus.   Assessment/Plan  Active Problems:   Acute systolic CHF (congestive heart failure), NYHA class 3   Ischemic cardiomyopathy   Acute systolic heart failure  63 year old [place] American female with past medical history significant for HTN, HLD,history of tobacco abuse, CAD s/p recent anterior STEMI with early in-stent thorombosis for missed dose of Brillinta s/p PCI x 2 into LAD (04/2014) and ischemic CM (EF 45-50%) who presented to the [facility] 08/13/14 with HF symptoms and uncontrolled HTN.  Note: Repeat 2D echo 08/14/14 with EF down at 15%.  ICM/Acute on chronic systolic HF- EF 15% (down from 45-50%) -- Still volume overloaded. She is diuresising well. Net neg 4.5 L and 1.3 L in past 24 hr. Given IV Lasix 40mg  x9. No diuretic therapy currently. Will place on Lasix 40mg  po bid and see how she does with this.  -- Scr slightly up but stable.  -- Continue Coreg and Bidil. No ACE/ARB until she is fully diuresed to avoid acute renal impairment per Dr. , also caution with possible hx of angioedema? -- Consider LiveVest at discharge as she is 3 months post her most recent ischemic event. Will set this up   CAD: s/p recent anterior STEMI with early in-stent thorombosis for missed dose of Brillinta s/p PCI x 2 into LAD (04/2014)  -- There is no chest pain and the patient has known severe RCA disease that has not been treated. There is no  evidence of MI on this occasion.  -- Continue Effient w/o ASA (concern for ASA allergy).  -- The entire story around possible angioedema makes management difficult. She has had stent thrombosis and currently is not on aspirin. There may be an issue with Effient but she had stent thrombosis on Brilinta. Am not inclined to use plavix given h/o AST.      LOS: 3 days    Katrinka Blazing, PA-C 08/16/2014 9:36 AM

## 2014-08-17 ENCOUNTER — Ambulatory Visit (HOSPITAL_COMMUNITY): Payer: No Typology Code available for payment source

## 2014-08-17 ENCOUNTER — Encounter (HOSPITAL_COMMUNITY): Payer: Self-pay | Admitting: Physician Assistant

## 2014-08-17 DIAGNOSIS — T783XXA Angioneurotic edema, initial encounter: Secondary | ICD-10-CM | POA: Diagnosis present

## 2014-08-17 DIAGNOSIS — I509 Heart failure, unspecified: Secondary | ICD-10-CM

## 2014-08-17 DIAGNOSIS — I2102 ST elevation (STEMI) myocardial infarction involving left anterior descending coronary artery: Secondary | ICD-10-CM

## 2014-08-17 MED ORDER — TICAGRELOR 90 MG PO TABS: 90.0000 mg | ORAL_TABLET | Freq: Two times a day (BID) | ORAL | Status: DC

## 2014-08-17 MED ORDER — ISOSORB DINITRATE-HYDRALAZINE 20-37.5 MG PO TABS: 1.0000 | ORAL_TABLET | Freq: Two times a day (BID) | ORAL | Status: DC

## 2014-08-17 MED ORDER — CARVEDILOL 6.25 MG PO TABS: 6.2500 mg | ORAL_TABLET | Freq: Every day | ORAL | Status: DC

## 2014-08-17 MED ORDER — TICAGRELOR 90 MG PO TABS
90.0000 mg | ORAL_TABLET | Freq: Two times a day (BID) | ORAL | Status: DC
  Administered 2014-08-17: 90 mg via ORAL
  Filled 2014-08-17 (×2): qty 1

## 2014-08-17 MED ORDER — SPIRONOLACTONE 25 MG PO TABS: 25.0000 mg | ORAL_TABLET | Freq: Every day | ORAL | Status: DC

## 2014-08-17 MED ORDER — FUROSEMIDE 40 MG PO TABS: 40.0000 mg | ORAL_TABLET | Freq: Every day | ORAL | Status: DC

## 2014-08-17 MED ORDER — ASPIRIN EC 81 MG PO TBEC
81.0000 mg | DELAYED_RELEASE_TABLET | Freq: Every day | ORAL | Status: DC
  Administered 2014-08-17: 81 mg via ORAL
  Filled 2014-08-17 (×2): qty 1

## 2014-08-17 MED ORDER — CARVEDILOL 6.25 MG PO TABS: 6.2500 mg | ORAL_TABLET | Freq: Two times a day (BID) | ORAL | Status: DC

## 2014-08-17 NOTE — Progress Notes (Signed)
Pt's IV D/c'd.  Tele D/C'd.  Pt was discharged via wheelchair, and was in no distress.  All discharge medications were discussed and she had no questions.  I stressed the importance of taking her Brilinta and not missing any doses, and to insure that she have her prescriptions refilled in a timely manner.

## 2014-08-17 NOTE — Progress Notes (Addendum)
The patient has ambulated in the hall and feels much improved. After taking aspirin this morning she began having facial and lip swelling (subjective). She also has the same complaint with Effient. She refuses to take these medications. We have therefore switched her back to Brilinta. She is on a standing dose of furosemide. Renal function is stable. She may need a more intense diuretic regimen but this can be adjusted as an outpatient.  Appointment is set for her to followup in the heart failure clinic. She will also followup with Dr. [place] her primary cardiologist.

## 2014-08-17 NOTE — Discharge Summary (Signed)
The patient has ambulated in the hall and feels much improved. After taking aspirin this morning she began having facial and lip swelling (subjective). She also has the same complaint with Effient. She refuses to take these medications. We have therefore switched her back to Brilinta. She is on a standing dose of furosemide. Renal function is stable. She may need a more intense diuretic regimen but this can be adjusted as an outpatient.  Appointment is set for her to followup in the heart failure clinic. She will also followup with Dr. Jordan her primary cardiologist.  

## 2014-08-17 NOTE — Progress Notes (Signed)
I stopped in to see Deborah Ho and provide her with AHF clinic appt, directions, and a map to assist her with making her appt.  She is scheduled for October 7 at 11:45am.

## 2014-08-17 NOTE — Discharge Summary (Signed)
Discharge Summary   Patient ID: Deborah Ho MRN: [id number], DOB/AGE: 63-Jun-1952 63 y.o. Admit date: 08/13/2014 D/C date:     08/17/2014  Primary Cardiologist:  Dr. Lolita Rieger CHF clinic  Principal Problem:   Acute systolic CHF (congestive heart failure), NYHA class 3 Active Problems:   HLD (hyperlipidemia)   Tobacco abuse   HTN (hypertension)   Ischemic cardiomyopathy   Angioedema of lips    Admission Dates: 08/13/14 - 08/17/14 Discharge Diagnosis: Acute on chronic systolic CHF. Discharge weight 205 lbs.  HPI: Deborah Ho is a 63 y.o. female with a history of HTN, HLD, history of tobacco abuse, CAD s/p recent anterior STEMI with early in-stent thorombosis for missed dose of Brillinta s/p PCI x 2 into LAD (04/2014) and ischemic CM (EF 45-50%) who presented to the [facility] 08/13/14 with CHF symptoms and uncontrolled HTN. A repeat 2D echo 08/14/14 revealed her EF was down to 15%.  Patient initially presented as anterior STEMI on 05/03/2014. Cardiac catheterization that time showed a 95% proximal LAD stenosis, 30% proximal left circumflex stenosis, 20% mild ramus intermedius stenosis, 70-80% long segment RCA stenosis. Patient's ejection fraction was noted to be 35% that time. She was placed on aspirin and Brilinta and eventually discharged on 05/03/2014. Patient came back to the [facility] hospital the following week on 05/09/2014 with a recurrent anterior MI because she missed one day of her Brilinta. Cardiac catheterization done on 6/24 showed subacute stent thrombosis in mid LAD which was treated with overlapping drug-eluting stent at the distal edge of the previous stent. Her LVEDP was noted at 23 mmHg at the time. Patient was eventually discharged with ASA and effient to followup with Dr. [place]. Interestingly, her echocardiogram obtained on 6/25 showed EF 45-50%, although her ejection fraction was noted to be 35% on cardiac catheterization. She had been placed on carvedilol, and lisinopril,  aspirin and Effient. Since her discharge, patient had been noting a productive cough (white foamy) that had been progressively getting worse. Since last last Friday (08/11/14) she had reported significant lower extremity swelling, recurrent orthopnea and paroxysmal nocturnal dyspnea. She also noted a recent allergic reaction to ASA or Effient ( not sure which) with lip and tongue swelling since August.    [facility] Course  ICM/Acute on chronic systolic HF- EF 15% (down from 45-50% in 04/2014). BNP 6.6K. CXR with pleural effusion on the right with right base consolidation. Elsewhere lungs clear. Mild cardiomegaly.  -- She diuresed well on IV Lasix. Net neg 4.1 L and neg 5 lbs (210--> 205lbs)  -- Yesterday she was converted to po Lasix 40mg  qd and aldactone 25mg  qd.  -- Scr slightly up but stable. 1.16 today.  -- Continue Coreg and Bidil. No ARB/ACE therapy due to the possibility of angioedema in the past.  -- Initiated LiveVest therapy which will be continued for 3 months while optimizing CHF therapy. If her EF has not recovered > 35% at 3 months, an ICD will be indicated.  --Continue Bidil 20-37.5 po BID, Lasix 40mg  qd and aldactone 25mg  qd.  -- BMET next week at follow up.   CAD: s/p recent anterior STEMI with early in-stent thorombosis for missed dose of Brillinta s/p PCI x 2 into LAD (04/2014)  -- The patient has known severe RCA disease that has not been treated; however there has been no chest pain or s/s of ischemia.  -- Upon admission the patient complained of a recurring allergic reaction to ASA or effient with lip and tongue swelling  over the couple months. She thought it was due to the preparation of the chewable ASA. She was continued on Effient during her admission and ASA was discontinued due to concern for possible reaction. However, she was noted by nursing to have mild facial swelling, dizziness, nausea, and weakness after Effient administration, making this the culprit medication. She  was then trailed on ASA and Brilinta. She had no reaction from Brilinta but did have subjective facial and lip swelling after ASA. She refuses to take either ASA or Effient. -- She has had stent thrombosis on Brilinta; however, there was documentation of non-compliance and missed doses. It was felt to be worrisome to convert to plavix given h/o AST and possibility of non-response.  -- Will convert to Brilinta 90mg  po BID and NO ASA. She has been counseled on the importance of not missing any doses of her Brilinta.  -- Continue Brilinta 90mg  po BID, carvedilol  6.25 mg BID and Lipitor 80mg    HTN --Her blood pressure on arrival was noted to be 179/121; however, she had not taken any of her medications that AM. -- This resolved with resuming her home meds and diuresis -- Continue Carvedilol 6.25mg  BID, Bidil 20-37.5 po BID, and spiro 25mg  qd.   Dispo-  Dr. reviewed chart and felt she was a good candidate for CHF clinic follow up. Follow up in CHF clinic has been arranged for 08/22/14. -- Will need BMET at follow up.  The patient has had an uncomplicated hospital course and is recovering well.  She has been seen by Dr. today and deemed ready for discharge home. All follow-up appointments have been scheduled.  A work excuse note was provided as well. Discharge medications are listed below.    Discharge Vitals: Blood pressure 122/64, pulse 91, temperature 97.7 F (36.5 C), temperature source Oral, resp. rate 18, height 5\' 5"  (1.651 m), weight 205 lb 14.6 oz (93.4 kg), SpO2 95.00%.  Labs: Lab Results  Component Value Date   WBC 11.8* 08/13/2014   HGB 14.9 08/13/2014   HCT 45.8 08/13/2014   MCV 87.2 08/13/2014   PLT 326 08/13/2014     Recent Labs Lab 08/16/14 0505  NA 143  K 3.7  CL 102  CO2 27  BUN 25*  CREATININE 1.16*  CALCIUM 8.3*  GLUCOSE 85   No results found for this basename: CKTOTAL, CKMB, TROPONINI,  in the last 72 hours Lab Results  Component Value Date   CHOL  209* 07/04/2014   HDL 48 07/04/2014   LDLCALC 127* 07/04/2014   TRIG 171* 07/04/2014     Diagnostic Studies/Procedures   Dg Chest 2 View  08/13/2014   CLINICAL DATA:  Difficulty breathing  EXAM: CHEST  2 VIEW  COMPARISON:  May 03, 2014  FINDINGS: There is a pleural effusion on the right with consolidation in the right base. Elsewhere lungs are clear. Heart is mildly enlarged with pulmonary vascularity within normal limits. No adenopathy. No bone lesions.  IMPRESSION: Pleural effusion on the right with right base consolidation. Elsewhere lungs clear. Mild cardiomegaly.      2D echo 08/14/14 Study Conclusions - Procedure narrative: Transthoracic echocardiography. Image quality was adequate. The study was technically difficult, as a result of poor patient compliance. Intravenous contrast (Definity) was administered. - Left ventricle: The cavity size was normal. Wall thickness was increased in a pattern of moderate LVH. The estimated ejection fraction was 15%. LV apex well visualized with contrast - no thrombus. There is a prominent  apical false tendon. Doppler parameters are consistent with restrictive left ventricular relaxation (grade 3 diastolic dysfunction). The E/e&' ratio is >15, suggesting elevated LV Filling pressure. - Mitral valve: There was moderate regurgitation. - Right ventricle: The cavity size was normal. Systolic function is reduced, particularly at the apex. - Right atrium: Moderately dilated (24 cm2). - Tricuspid valve: There was moderate regurgitation. - Pulmonary arteries: PA peak pressure: 40 mm Hg (S). - Inferior vena cava: The vessel was dilated. The respirophasic diameter changes were blunted (< 50%), consistent with elevated central venous pressure. Impressions: - Compared to the prior study on 05/10/14, the EF has fallen from 40% to 15% with severe global hypokinesis. There is moderate MR and TR with elevated left and right-sided filling pressures  and restrictive diastolic physiology. Definity contrast did not demonstrate an apical thrombus.     Discharge Medications     Medication List    STOP taking these medications       OVER THE COUNTER MEDICATION     prasugrel 10 MG Tabs tablet  Commonly known as:  EFFIENT      TAKE these medications       atorvastatin 80 MG tablet  Commonly known as:  LIPITOR  Take 40 mg by mouth daily at 6 PM.     carvedilol 6.25 MG tablet  Commonly known as:  COREG  Take 1 tablet (6.25 mg total) by mouth 2 (two) times daily with a meal.     cetirizine 10 MG tablet  Commonly known as:  ZYRTEC  Take 10 mg by mouth daily as needed for allergies.     furosemide 40 MG tablet  Commonly known as:  LASIX  Take 1 tablet (40 mg total) by mouth daily.     isosorbide-hydrALAZINE 20-37.5 MG per tablet  Commonly known as:  BIDIL  Take 1 tablet by mouth 2 (two) times daily.     nitroGLYCERIN 0.4 MG SL tablet  Commonly known as:  NITROSTAT  Place 0.4 mg under the tongue every 5 (five) minutes as needed for chest pain.     spironolactone 25 MG tablet  Commonly known as:  ALDACTONE  Take 1 tablet (25 mg total) by mouth daily.     ticagrelor 90 MG Tabs tablet  Commonly known as:  BRILINTA  Take 1 tablet (90 mg total) by mouth 2 (two) times daily.        Disposition   The patient will be discharged in stable condition to home.  Follow-up Information   Follow up with Aundria Rud, NP On 08/22/2014. (at 11:45am in the [facility]--Gate code --0500)    Specialty:  Nurse Practitioner   Contact information:   [address] [place] [address] [phone]         Duration of Discharge Encounter: Greater than 30 minutes including physician and PA time.  Byrd Hesselbach R PA-C 08/17/2014, 3:37 PM

## 2014-08-17 NOTE — Progress Notes (Signed)
Patient Profile: 63 year old African American female with past medical history significant for HTN, HLD,history of tobacco abuse, CAD s/p recent anterior STEMI with early in-stent thorombosis for missed dose of Brillinta s/p PCI x 2 into LAD (04/2014) and ischemic CM (EF 45-50%) who presented to the [facility] 08/13/14 with HF symptoms and uncontrolled HTN.  Note: Repeat 2D echo 08/14/14 with EF down at 15%.   Subjective: Feeling much better. Ready to go home. No CP or SOB.   Objective: Vital signs in last 24 hours: Temp:  [97.7 F (36.5 C)] 97.7 F (36.5 C) (10/02 0555) Pulse Rate:  [87-92] 88 (10/02 0555) Resp:  [18-20] 18 (10/02 0555) BP: (108-135)/(59-74) 135/67 mmHg (10/02 0555) SpO2:  [95 %-99 %] 95 % (10/02 0555) Weight:  [205 lb 14.6 oz (93.4 kg)] 205 lb 14.6 oz (93.4 kg) (10/02 0555) Last BM Date: 08/15/14  Intake/Output from previous day: 10/01 0701 - 10/02 0700 In: 990 [P.O.:990] Out: 604 [Urine:602; Stool:2] Intake/Output this shift:    Medications Current Facility-Administered Medications  Medication Dose Route Frequency Provider Last Rate Last Dose  . 0.9 %  sodium chloride infusion  250 mL Intravenous PRN [name], PA      . acetaminophen (TYLENOL) tablet 650 mg  650 mg Oral Q4H PRN [name], PA   650 mg at 08/16/14 1826  . atorvastatin (LIPITOR) tablet 40 mg  40 mg Oral q1800 [name], PA   40 mg at 08/16/14 1823  . carvedilol (COREG) tablet 6.25 mg  6.25 mg Oral BID WC Leeann Must, MD   6.25 mg at 08/16/14 1625  . furosemide (LASIX) tablet 40 mg  40 mg Oral Daily Janetta Hora, PA-C      . heparin injection 5,000 Units  5,000 Units Subcutaneous 3 times per day [name], PA   5,000 Units at 08/17/14 0533  . isosorbide-hydrALAZINE (BIDIL) 20-37.5 MG per tablet 1 tablet  1 tablet Oral BID Lyn Records III, MD   1 tablet at 08/16/14 2127  . nitroGLYCERIN (NITROSTAT) SL tablet 0.4 mg  0.4 mg Sublingual Q5 min PRN [name], PA      . ondansetron (ZOFRAN) injection 4  mg  4 mg Intravenous Q6H PRN [name], PA      . prasugrel (EFFIENT) tablet 10 mg  10 mg Oral Daily [name], [place]   10 mg at 08/16/14 0947  . sodium chloride 0.9 % injection 3 mL  3 mL Intravenous Q12H [name], PA   3 mL at 08/16/14 0945  . sodium chloride 0.9 % injection 3 mL  3 mL Intravenous PRN [name], PA      . spironolactone (ALDACTONE) tablet 25 mg  25 mg Oral Daily Janetta Hora, PA-C   25 mg at 08/16/14 1205    PE: General appearance: alert, cooperative, no distress and moderately obese Neck: no JVD Lungs: CTAB Heart: regular rate and rhythm Extremities:1+ bilateral LEE L>R Pulses: 2+ and symmetric Skin: wrm and dry Neurologic: Grossly normal  Lab Results:  No results found for this basename: WBC, HGB, HCT, PLT,  in the last 72 hours BMET  Recent Labs  08/15/14 0520 08/16/14 0505  NA 145 143  K 3.4* 3.7  CL 105 102  CO2 27 27  GLUCOSE 90 85  BUN 24* 25*  CREATININE 1.26* 1.16*  CALCIUM 8.7 8.3*    Studies/Results:  2D echo 08/14/14  Study Conclusions - Procedure narrative: Transthoracic echocardiography. Image quality was adequate. The study was  technically difficult, as a result of poor patient compliance. Intravenous contrast (Definity) was administered. - Left ventricle: The cavity size was normal. Wall thickness was increased in a pattern of moderate LVH. The estimated ejection fraction was 15%. LV apex well visualized with contrast - no thrombus. There is a prominent apical false tendon. Doppler parameters are consistent with restrictive left ventricular relaxation (grade 3 diastolic dysfunction). The E/e&' ratio is >15, suggesting elevated LV Filling pressure. - Mitral valve: There was moderate regurgitation. - Right ventricle: The cavity size was normal. Systolic function is reduced, particularly at the apex. - Right atrium: Moderately dilated (24 cm2). - Tricuspid valve: There was moderate regurgitation. - Pulmonary arteries: PA peak  pressure: 40 mm Hg (S). - Inferior vena cava: The vessel was dilated. The respirophasic diameter changes were blunted (< 50%), consistent with elevated central venous pressure. Impressions: - Compared to the prior study on 05/10/14, the EF has fallen from 40% to 15% with severe global hypokinesis. There is moderate MR and TR with elevated left and right-sided filling pressures and restrictive diastolic physiology. Definity contrast did not demonstrate an apical thrombus.   Assessment/Plan  Active Problems:   Acute systolic CHF (congestive heart failure), NYHA class 3   Ischemic cardiomyopathy   Acute systolic heart failure  63 year old [place] American female with past medical history significant for HTN, HLD,history of tobacco abuse, CAD s/p recent anterior STEMI with early in-stent thorombosis for missed dose of Brillinta s/p PCI x 2 into LAD (04/2014) and ischemic CM (EF 45-50%) who presented to the [facility] 08/13/14 with HF symptoms and uncontrolled HTN.  Note: Repeat 2D echo 08/14/14 with EF down at 15%.  ICM/Acute on chronic systolic HF- EF 15% (down from 45-50%). BNP 6.6K. CXR with pleural effusion on the right with right base consolidation. Elsewhere lungs clear. Mild cardiomegaly. -- Still slightly volume overloaded but approaching dry weight. She diuresed well on IV Lasix. Net neg 4.1 L and neg 5 lbs (210--> 205lbs) -- Yesterday she was converted to po Lasix 40mg  qd and aldactone 25mg  qd. -- Scr slightly up but stable. 1.16 today.  -- Continue Coreg and Bidil. No ARB/ACE therapy due to the possibility of angioedema in the past --  Initiated LiveVest therapy which will be continued for 3 months while optimizing CHF therapy. If her EF has not recovered > 35% at 3 months, an ICD will be indicated.   CAD: s/p recent anterior STEMI with early in-stent thorombosis for missed dose of Brillinta s/p PCI x 2 into LAD (04/2014)  -- The patient has known severe RCA disease that has not been  treated; however there has been no chest pain or s/s of ischemia. -- Has been continued on Effient and ASA discontinued due to concern for reaction. However, patient now has a documented reaction to Effient without ASA while in the hospital, making this the culprit medication.The entire story around possible angioedema makes management difficult. She has had stent thrombosis on Brilinta; however, there was documentation of non-compliance and missed doses. It is worrisome to convert to plavix given h/o AST and possibility of non-response.  -- Will convert to Brilinta 90mg  po BID and resume ASA.  Dispo- will likely go home today.  Dr. reviewed chart and felt she was a good candidate for CHF clinic follow up.     LOS: 4 days    02-15-1983, PA-C 08/17/2014 7:49 AM

## 2014-08-17 NOTE — Progress Notes (Signed)
Nutrition Education Note  RD consulted for nutrition education regarding low sodium diet. RD met with patient during admission back in June and covered Heart Healthy diet. Today, emphasized low sodium diet. Pt states she already read some material and realized she was eating too much soup and deli meats.  RD provided "Low Sodium Nutrition Therapy" handout from the Academy of [employer] and [employer]etics. Reviewed patient's dietary recall. Provided examples on ways to decrease sodium intake in diet. Provided "Sodium Content of Foods List" and "sodium Free Flavoring Tips" handouts. Answered patient's questions regarding sodium content of foods.  Encouraged fresh fruits and vegetables as well as whole grain sources of carbohydrates to maximize fiber intake.   RD discussed why it is important for patient to adhere to diet recommendations, and emphasized the role of fluids, foods to avoid, and importance of weighing self daily. Teach back method used.  Expect very good compliance.  Body mass index is 34.27 kg/(m^2). Pt meets criteria for Obesity based on current BMI.  Current diet order is 2 Gram Sodium, patient is consuming approximately 100% of meals at this time. Labs and medications reviewed. No further nutrition interventions warranted at this time. RD contact information provided. If additional nutrition issues arise, please re-consult RD.   Pryor Ochoa RD, LDN Inpatient Clinical Dietitian Pager: [phone] After Hours Pager: [phone]

## 2014-08-17 NOTE — Progress Notes (Signed)
Pharmacist HF Discharge Medication Education   Patient was educated on process for home medication reconciliation and provided written instructions as well. Patient was also counseled on HF medications and changes to be made upon arriving home. She was provided with the "Living Better with Heart Failure" patient education packet and a medication bag to be brought with her to her follow up clinic visits with her current medications.   Margie Billet, PharmD Clinical Pharmacist - Resident Pager: [phone] Pharmacy: [phone] 08/17/2014 3:03 PM

## 2014-08-20 ENCOUNTER — Ambulatory Visit (HOSPITAL_COMMUNITY): Payer: No Typology Code available for payment source

## 2014-08-22 ENCOUNTER — Encounter (HOSPITAL_COMMUNITY): Payer: Self-pay

## 2014-08-22 ENCOUNTER — Ambulatory Visit (HOSPITAL_COMMUNITY)
Admit: 2014-08-22 | Discharge: 2014-08-22 | Disposition: A | Discharge status: Home / Self Care | Payer: No Typology Code available for payment source | Source: Ambulatory Visit | Attending: Cardiology | Admitting: Cardiology

## 2014-08-22 ENCOUNTER — Encounter: Payer: Self-pay | Admitting: *Deleted

## 2014-08-22 ENCOUNTER — Telehealth (HOSPITAL_COMMUNITY): Payer: Self-pay | Admitting: Anesthesiology

## 2014-08-22 ENCOUNTER — Ambulatory Visit (HOSPITAL_COMMUNITY): Payer: No Typology Code available for payment source

## 2014-08-22 VITALS — BP 150/100 | HR 99 | Wt 202.8 lb

## 2014-08-22 DIAGNOSIS — I2510 Atherosclerotic heart disease of native coronary artery without angina pectoris: Secondary | ICD-10-CM | POA: Diagnosis not present

## 2014-08-22 DIAGNOSIS — I10 Essential (primary) hypertension: Secondary | ICD-10-CM | POA: Diagnosis not present

## 2014-08-22 DIAGNOSIS — I5042 Chronic combined systolic (congestive) and diastolic (congestive) heart failure: Secondary | ICD-10-CM | POA: Insufficient documentation

## 2014-08-22 DIAGNOSIS — I255 Ischemic cardiomyopathy: Secondary | ICD-10-CM

## 2014-08-22 DIAGNOSIS — E785 Hyperlipidemia, unspecified: Secondary | ICD-10-CM | POA: Insufficient documentation

## 2014-08-22 DIAGNOSIS — I252 Old myocardial infarction: Secondary | ICD-10-CM | POA: Insufficient documentation

## 2014-08-22 DIAGNOSIS — I5022 Chronic systolic (congestive) heart failure: Secondary | ICD-10-CM

## 2014-08-22 DIAGNOSIS — F17210 Nicotine dependence, cigarettes, uncomplicated: Secondary | ICD-10-CM | POA: Diagnosis not present

## 2014-08-22 DIAGNOSIS — I5023 Acute on chronic systolic (congestive) heart failure: Secondary | ICD-10-CM | POA: Insufficient documentation

## 2014-08-22 DIAGNOSIS — Z955 Presence of coronary angioplasty implant and graft: Secondary | ICD-10-CM | POA: Diagnosis not present

## 2014-08-22 DIAGNOSIS — I2109 ST elevation (STEMI) myocardial infarction involving other coronary artery of anterior wall: Secondary | ICD-10-CM

## 2014-08-22 LAB — BASIC METABOLIC PANEL
Anion gap: 12 (ref 5–15)
BUN: 17 mg/dL (ref 6–23)
CO2: 25 mEq/L (ref 19–32)
Calcium: 9.1 mg/dL (ref 8.4–10.5)
Chloride: 108 mEq/L (ref 96–112)
Creatinine, Ser: 1.1 mg/dL (ref 0.50–1.10)
GFR calc Af Amer: 61 mL/min — ABNORMAL LOW (ref >90)
GFR calc non Af Amer: 52 mL/min — ABNORMAL LOW (ref >90)
Glucose, Bld: 97 mg/dL (ref 70–99)
Potassium: 4.3 mEq/L (ref 3.7–5.3)
Sodium: 145 mEq/L (ref 137–147)

## 2014-08-22 LAB — PRO B NATRIURETIC PEPTIDE: Pro B Natriuretic peptide (BNP): 3059 pg/mL — ABNORMAL HIGH (ref 0–125)

## 2014-08-22 MED ORDER — ISOSORB DINITRATE-HYDRALAZINE 20-37.5 MG PO TABS: 1.0000 | ORAL_TABLET | Freq: Three times a day (TID) | ORAL | Status: DC

## 2014-08-22 MED ORDER — CARVEDILOL 6.25 MG PO TABS: ORAL_TABLET | ORAL | Status: DC

## 2014-08-22 MED ORDER — FUROSEMIDE 40 MG PO TABS: 40.0000 mg | ORAL_TABLET | Freq: Two times a day (BID) | ORAL | Status: DC

## 2014-08-22 NOTE — Progress Notes (Signed)
Patient ID: Deborah Ho, female   DOB: October 06, 1951, 63 y.o.   MRN: [id number] PCP: Dr. Delle Reining, [place] Primary Cardiologist: Dr. [place]  HPI:  63 year old African American female with past medical history significant for hypertension, hyperlipidemia, history of tobacco abuse, 3V coronary artery disease status post anterior MI x2 and stents to LAD, ICM and chronic systolic HF.  STEMI 05/03/14 and underwent LHC showing 95% proximal LAD stenosis, 30% proximal left circumflex stenosis, 20% mild ramus intermedius stenosis, 70-80% long segment RCA stenosis. EF 35% at the time and underwent DES to LAD. Presented back to hospital 05/09/14 with anterior MI after missing Brilinta x1 day and LHC showed subacute stent thrombosis of the stent in the mid left anterior descending artery. Successful thrombectomy was performed with restoration of TIMI-3 flow.   Post Hospital Follow up for Heart Failure: Just discharged from the hospital for A/C HF and discharge weight was 205 lbs. Denies SOB, orthopnea, PND or CP. Able go upstairs and around the whole grocery store. Denies dizziness. Weight at home 199-201 lbs. Following a low salt diet and trying to drink less than 2L a day. Does not have Bidil yet and not sure why.   ROS: All systems negative except as listed in HPI, PMH and Problem List.  SH:  History   Social History  . Marital Status: Widowed    Spouse Name: N/A    Number of Children: N/A  . Years of Education: N/A   Occupational History  . not employed    Social History Main Topics  . Smoking status: Current Every Day Smoker -- 0.10 packs/day for .5 years    Types: Cigarettes  . Smokeless tobacco: Never Used     Comment: down to 3 cigarettes daily (08/03/14)  . Alcohol Use: No  . Drug Use: No  . Sexual Activity: Not on file   Other Topics Concern  . Not on file   Social History Narrative   Patient has 6 brothers and sisters and none have known coronary artery disease. She  lives alone in [place], but has several brothers in the area.    FH:  Family History  Problem Relation Age of Onset  . Diabetes Mother   . Hypertension Mother     Past Medical History  Diagnosis Date  . GERD (gastroesophageal reflux disease)     Probable  . CAD (coronary artery disease) 05/03/14; 05/09/14    a. anterior STEMI with early in-stent thorombosis for missed dose of Brillinta s/p PCI with DESx 2 into LAD (04/2014)  . HTN (hypertension)   . Hyperlipidemia   . Tobacco use   . Non compliance w medication regimen   . Ischemic cardiomyopathy     a. 04/2014 ECHO with EF 45-50% b.  Repeat 2D echo 08/14/14 with EF down at 15%. Life vest placed    Current Outpatient Prescriptions  Medication Sig Dispense Refill  . atorvastatin (LIPITOR) 80 MG tablet Take 40 mg by mouth daily at 6 PM.      . carvedilol (COREG) 6.25 MG tablet Take 1 tablet (6.25 mg total) by mouth 2 (two) times daily with a meal.  60 tablet  11  . cetirizine (ZYRTEC) 10 MG tablet Take 10 mg by mouth daily as needed for allergies.      . furosemide (LASIX) 40 MG tablet Take 1 tablet (40 mg total) by mouth daily.  30 tablet  3  . nitroGLYCERIN (NITROSTAT) 0.4 MG SL tablet Place 0.4 mg under the tongue  every 5 (five) minutes as needed for chest pain.      Marland Kitchen spironolactone (ALDACTONE) 25 MG tablet Take 1 tablet (25 mg total) by mouth daily.  30 tablet  3  . ticagrelor (BRILINTA) 90 MG TABS tablet Take 1 tablet (90 mg total) by mouth 2 (two) times daily.  60 tablet  11  . isosorbide-hydrALAZINE (BIDIL) 20-37.5 MG per tablet Take 1 tablet by mouth 2 (two) times daily.  60 tablet  3   No current facility-administered medications for this encounter.    Filed Vitals:   08/22/14 1200  BP: 150/100  Pulse: 99  Weight: 202 lb 12.8 oz (91.989 kg)  SpO2: 98%    PHYSICAL EXAM: General:  Well appearing. No resp difficulty HEENT: normal Neck: supple. JVP 9. Carotids 2+ bilaterally; no bruits. No lymphadenopathy or  thryomegaly appreciated. Cor: PMI normal. Regular rate & rhythm. No rubs or gallops, 2/6 systolic murmur Lungs: clear Abdomen: soft, nontender, nondistended. No hepatosplenomegaly. No bruits or masses. Good bowel sounds. Extremities: no cyanosis, clubbing, rash, 1+ bilateral edema Neuro: alert & orientedx3, cranial nerves grossly intact. Moves all 4 extremities w/o difficulty. Affect pleasant.  ASSESSMENT & PLAN:  1) Chronic combined systolic/diastolic HF: ICM, EF 15%, grade III DD, mod MR, RV sys fx reduced, mod TR (07/2014) - Patient recently discharged from the hospital for A/C HF. She was diuresed with IV lasix and HF medications titrated.  - NYHA II symptoms and volume status mildly elevated. Will check BMET and pro-BNP today with plan to increase lasix to 40 mg BID. - Increase coreg to 6.25 mg q am and 9.375 mg q pm. Placed new label on the bottle. - Patient has had angioedema in the past not sure if related to Effient but is not on ACE I/ARB. - Never picked up her Bidil prescription. Changed to 20-37.5 mg TID and sent new prescription for patient. - Continue spironolactone 25 mg daily.  - Patient was ordered LifeVest in the hospital but reports no one ordered it. I discussed with her in depth the risks for SCD and that I could order LifeVest, however she did not want ordered. I asked if it would help if she talked to her primary cardiologist since she seems to not believe that Dr. Katrinka Blazing ordered it and I am not sure trusts me yet, however she said no. - Will need to repeat ECHO 11/2013 to reassess EF. If remains less than 35% will need referral for ICD. Not a CRT candidate d/t narrow QRS. - Reinforced the need and importance of daily weights, a low sodium diet, and fluid restriction (less than 2 L a day). Instructed to call the HF clinic if weight increases more than 3 lbs overnight or 5 lbs in a week.  2) 3V CAD - Patient had DES placed to LAD in 04/2014 with subsequent subacute stent  thrombosis and underwent thrombectomy. Stressed the importance to patient to take Brilinta, ASA and statin. No s/s of ischemia. Managed by Dr. [place] 3) HTN - SBP elevated. Will increase coreg slightly and have asked the patient to start taking Bidil TID. 4) Valvular abnormalities: - mod MR and mo TR. Will continue to follow   Spent 40 minutes with over 1/2 the time spent counseling the patient on the above issues. I am not sure if the patient truly understands her condition and spent a lot of time going over fluid and salt restrictions. Will continue to follow up on education.  Ulla Potash B NP-C 5:36  PM

## 2014-08-22 NOTE — Patient Instructions (Signed)
Doing great.  Increase your coreg to 6.25 mg (1 tablet) in the morning and 9.375 mg (1 1/2 tablets) in the evening.  Start taking Bidil 20-37.5 mg (1 tablet) three times a day, call if too expensive to get.  Follow up in 2 weeks.  Do the following things EVERYDAY: 1) Weigh yourself in the morning before breakfast. Write it down and keep it in a log. 2) Take your medicines as prescribed 3) Eat low salt foods-Limit salt (sodium) to 2000 mg per day.  4) Stay as active as you can everyday 5) Limit all fluids for the day to less than 2 liters 6)

## 2014-08-22 NOTE — Telephone Encounter (Signed)
Pro-BNP still elevated and on exam had some volume on board. Will increase lasix to 40 mg BID. Left message for patient and sent new prescription. Repeat labs in 2 weeks.  Ulla Potash B NP-C 5:55 PM

## 2014-08-24 ENCOUNTER — Ambulatory Visit (HOSPITAL_COMMUNITY): Payer: No Typology Code available for payment source

## 2014-08-27 ENCOUNTER — Ambulatory Visit (HOSPITAL_COMMUNITY): Payer: No Typology Code available for payment source

## 2014-08-28 ENCOUNTER — Ambulatory Visit: Payer: Self-pay

## 2014-08-29 ENCOUNTER — Ambulatory Visit (HOSPITAL_COMMUNITY): Payer: No Typology Code available for payment source

## 2014-08-31 ENCOUNTER — Ambulatory Visit (HOSPITAL_COMMUNITY): Payer: No Typology Code available for payment source

## 2014-08-31 ENCOUNTER — Telehealth: Payer: Self-pay | Admitting: Cardiology

## 2014-08-31 NOTE — Telephone Encounter (Signed)
Ms.Crocket is calling because the cough is still terrible  and would like to speak to you about it... Please Call    Thanks

## 2014-08-31 NOTE — Telephone Encounter (Signed)
Returned call to patient she stated she has had a dry to productive cough since discharged from hospital 08/17/14.Stated she has tried all cough medications over the counter with no relief.Stated she has appointment with a new PCP 09/04/14.Also has appointment with CHF clinic 09/05/14.Stated she has no swelling in lower legs.Stated she has lost 6 lbs.Stated she wanted to ask Dr.Jordan if he can prescribe something for her cough.Message sent to Dr.Jordan for advice.

## 2014-08-31 NOTE — Telephone Encounter (Signed)
Returned call to patient Dr.Jordan advised to take Tussin DM for cough.

## 2014-08-31 NOTE — Telephone Encounter (Signed)
I would recommend she take Tussin DM for cough.   Amelia Burgard [place] MD, [facility]

## 2014-09-03 ENCOUNTER — Ambulatory Visit (HOSPITAL_COMMUNITY): Payer: No Typology Code available for payment source

## 2014-09-05 ENCOUNTER — Inpatient Hospital Stay (HOSPITAL_COMMUNITY): Admission: RE | Admit: 2014-09-05 | Payer: No Typology Code available for payment source | Source: Ambulatory Visit

## 2014-09-05 ENCOUNTER — Ambulatory Visit (HOSPITAL_COMMUNITY): Payer: No Typology Code available for payment source

## 2014-09-07 ENCOUNTER — Ambulatory Visit (HOSPITAL_COMMUNITY): Payer: No Typology Code available for payment source

## 2014-09-10 ENCOUNTER — Ambulatory Visit (HOSPITAL_COMMUNITY): Payer: No Typology Code available for payment source

## 2014-09-12 ENCOUNTER — Ambulatory Visit (HOSPITAL_COMMUNITY): Payer: No Typology Code available for payment source

## 2014-09-12 ENCOUNTER — Telehealth: Payer: Self-pay | Admitting: Cardiology

## 2014-09-12 NOTE — Telephone Encounter (Signed)
Returned call to patient she stated she has had increase swelling in both lower legs and feet since Friday 09/07/14.No sob. When reviewing patient's medications she only takes lasix 40 mg daily,takes 40 mg twice a day on weekends.Stated she is not taking isosorbide/hydralazine.Advised she needs to take lasix 40 mg twice a day and isosorbide/hydralazine 20/37.5 mg three times a day.Reviewed all other medications and told patient she needs to take them as prescribed.Stated she wanted appointment with Dr.Jordan.Stated she wanted to know what is causing her legs to swell.Patient was told her legs are swelling because of her heart and it is very important she take her medications as prescribed.Advised will let Dr.Jordan know she called.Appointment scheduled with Nada Boozer 09/27/14 at 9:00 am.

## 2014-09-12 NOTE — Telephone Encounter (Signed)
Please call,legs and feet are swollen excessively.

## 2014-09-13 ENCOUNTER — Encounter (HOSPITAL_COMMUNITY): Payer: Self-pay | Admitting: Emergency Medicine

## 2014-09-13 ENCOUNTER — Emergency Department (HOSPITAL_COMMUNITY): Payer: No Typology Code available for payment source

## 2014-09-13 ENCOUNTER — Inpatient Hospital Stay (HOSPITAL_COMMUNITY)
Admission: EM | Admit: 2014-09-13 | Discharge: 2014-09-19 | DRG: 293 | Disposition: A | Discharge status: Home / Self Care | Payer: No Typology Code available for payment source | Attending: Cardiology | Admitting: Cardiology

## 2014-09-13 DIAGNOSIS — Z599 Problem related to housing and economic circumstances, unspecified: Secondary | ICD-10-CM

## 2014-09-13 DIAGNOSIS — R0602 Shortness of breath: Secondary | ICD-10-CM | POA: Diagnosis not present

## 2014-09-13 DIAGNOSIS — I2510 Atherosclerotic heart disease of native coronary artery without angina pectoris: Secondary | ICD-10-CM | POA: Diagnosis present

## 2014-09-13 DIAGNOSIS — F17210 Nicotine dependence, cigarettes, uncomplicated: Secondary | ICD-10-CM | POA: Diagnosis present

## 2014-09-13 DIAGNOSIS — I5023 Acute on chronic systolic (congestive) heart failure: Secondary | ICD-10-CM | POA: Diagnosis not present

## 2014-09-13 DIAGNOSIS — I10 Essential (primary) hypertension: Secondary | ICD-10-CM | POA: Diagnosis present

## 2014-09-13 DIAGNOSIS — K219 Gastro-esophageal reflux disease without esophagitis: Secondary | ICD-10-CM | POA: Diagnosis present

## 2014-09-13 DIAGNOSIS — I509 Heart failure, unspecified: Secondary | ICD-10-CM

## 2014-09-13 DIAGNOSIS — Z9114 Patient's other noncompliance with medication regimen: Secondary | ICD-10-CM | POA: Diagnosis present

## 2014-09-13 DIAGNOSIS — Z79899 Other long term (current) drug therapy: Secondary | ICD-10-CM

## 2014-09-13 DIAGNOSIS — I252 Old myocardial infarction: Secondary | ICD-10-CM

## 2014-09-13 DIAGNOSIS — Z955 Presence of coronary angioplasty implant and graft: Secondary | ICD-10-CM

## 2014-09-13 DIAGNOSIS — E785 Hyperlipidemia, unspecified: Secondary | ICD-10-CM | POA: Diagnosis present

## 2014-09-13 DIAGNOSIS — I255 Ischemic cardiomyopathy: Secondary | ICD-10-CM | POA: Diagnosis present

## 2014-09-13 DIAGNOSIS — E876 Hypokalemia: Secondary | ICD-10-CM | POA: Diagnosis not present

## 2014-09-13 LAB — CBC
HCT: 48.3 % — ABNORMAL HIGH (ref 36.0–46.0)
Hemoglobin: 15.6 g/dL — ABNORMAL HIGH (ref 12.0–15.0)
MCH: 28 pg (ref 26.0–34.0)
MCHC: 32.3 g/dL (ref 30.0–36.0)
MCV: 86.6 fL (ref 78.0–100.0)
Platelets: 237 10*3/uL (ref 150–400)
RBC: 5.58 MIL/uL — ABNORMAL HIGH (ref 3.87–5.11)
RDW: 14.8 % (ref 11.5–15.5)
WBC: 11 10*3/uL — ABNORMAL HIGH (ref 4.0–10.5)

## 2014-09-13 LAB — BASIC METABOLIC PANEL
Anion gap: 18 — ABNORMAL HIGH (ref 5–15)
BUN: 17 mg/dL (ref 6–23)
CO2: 19 mEq/L (ref 19–32)
Calcium: 9 mg/dL (ref 8.4–10.5)
Chloride: 107 mEq/L (ref 96–112)
Creatinine, Ser: 1.02 mg/dL (ref 0.50–1.10)
GFR calc Af Amer: 66 mL/min — ABNORMAL LOW (ref >90)
GFR calc non Af Amer: 57 mL/min — ABNORMAL LOW (ref >90)
Glucose, Bld: 133 mg/dL — ABNORMAL HIGH (ref 70–99)
Potassium: 3.7 mEq/L (ref 3.7–5.3)
Sodium: 144 mEq/L (ref 137–147)

## 2014-09-13 LAB — I-STAT TROPONIN, ED: Troponin i, poc: 0.02 ng/mL (ref 0.00–0.08)

## 2014-09-13 LAB — PRO B NATRIURETIC PEPTIDE: Pro B Natriuretic peptide (BNP): 12687 pg/mL — ABNORMAL HIGH (ref 0–125)

## 2014-09-13 MED ORDER — ONDANSETRON HCL 4 MG/2ML IJ SOLN
4.0000 mg | Freq: Once | INTRAMUSCULAR | Status: AC
  Administered 2014-09-13: 4 mg via INTRAVENOUS
  Filled 2014-09-13: qty 2

## 2014-09-13 MED ORDER — FUROSEMIDE 10 MG/ML IJ SOLN
40.0000 mg | Freq: Once | INTRAMUSCULAR | Status: AC
  Administered 2014-09-13: 40 mg via INTRAVENOUS
  Filled 2014-09-13: qty 4

## 2014-09-13 NOTE — ED Notes (Signed)
Pt c/o bilateral edema in lower limbs and increased shortness of breath on exertion. Recently discharged for CHF x 1 month ago; reports when leaving the hospital "my feet didn't look like that; now they're so swollen." Has been taking furosemide as prescribed at home. Crackles heard bilaterally. Generalized edema noted.

## 2014-09-13 NOTE — Telephone Encounter (Signed)
Returned call to patient she stated she is angry.Stated no one ever told her she had CHF.Advised she needs to be on a low sodium diet.Advised she needs to take all of her medications as prescribed.Advised to keep appointment with Nada Boozer NP 09/27/14 at 9:00 am.Advised to call sooner if needed.Handi cap parking form signed by Dr.Jordan will put in mail.

## 2014-09-13 NOTE — ED Provider Notes (Signed)
CSN: [id number]     Arrival date & time 09/13/14  1835 History   First MD Initiated Contact with Patient 09/13/14 2246     Chief Complaint  Patient presents with  . Leg Swelling  . Edema     (Consider location/radiation/quality/duration/timing/severity/associated sxs/prior Treatment) HPI Comments: Patient with recent admission for CHF (08/13/14), reports she was doing well after discharge until about one week ago when she started to have increased swelling of her feet and shortness of breath with exertion.  Lower extremity edema has increased and ascended from her feet to her knees.  Patient is on lasix at home, states she does not seem to be urinating very often despite medication.  Denies chest pain.  Mild nausea, is spitting up clear emesis.  Patient is a 63 y.o. female presenting with shortness of breath.  Shortness of Breath Severity:  Mild Onset quality:  Gradual Duration:  7 days Timing:  Intermittent Context: activity   Ineffective treatments:  Diuretics Associated symptoms: vomiting   Associated symptoms: no chest pain and no hemoptysis     Past Medical History  Diagnosis Date  . GERD (gastroesophageal reflux disease)     Probable  . CAD (coronary artery disease) 05/03/14; 05/09/14    a. anterior STEMI with early in-stent thorombosis for missed dose of Brillinta s/p PCI with DESx 2 into LAD (04/2014)  . HTN (hypertension)   . Hyperlipidemia   . Tobacco use   . Non compliance w medication regimen   . Ischemic cardiomyopathy     a. 04/2014 ECHO with EF 45-50% b.  Repeat 2D echo 08/14/14 with EF down at 15%. Life vest placed   Past Surgical History  Procedure Laterality Date  . Partial hysterectomy    . Cholecystectomy    . Coronary angioplasty with stent placement  05/03/14    STEMI- stent to LAD DES- Xience alpine  . Coronary angioplasty with stent placement  05/09/14    STEMI- overlapping stent to LAD, pt had missed a dose of Brilinta   Family History  Problem  Relation Age of Onset  . Diabetes Mother   . Hypertension Mother    History  Substance Use Topics  . Smoking status: Current Every Day Smoker -- 0.10 packs/day for .5 years    Types: Cigarettes  . Smokeless tobacco: Never Used     Comment: down to 3 cigarettes daily (08/03/14)  . Alcohol Use: No   OB History   Grav Para Term Preterm Abortions TAB SAB Ect Mult Living                 Review of Systems  Constitutional: Positive for activity change and fatigue.  Respiratory: Positive for shortness of breath. Negative for hemoptysis and chest tightness.   Cardiovascular: Positive for leg swelling. Negative for chest pain.  Gastrointestinal: Positive for nausea, vomiting and abdominal distention.  Genitourinary: Positive for decreased urine volume.  All other systems reviewed and are negative.     Allergies  Aspirin; Effient; Lactose intolerance (gi); Robitussin dm; Sulfa antibiotics; and Wheat bran  Home Medications   Prior to Admission medications   Medication Sig Start Date End Date Taking? Authorizing Provider  atorvastatin (LIPITOR) 80 MG tablet Take 40 mg by mouth daily at 6 PM. 05/06/14  Yes Dwana Melena, PA-C  carvedilol (COREG) 6.25 MG tablet Take 6.25 mg (1 tablets) in the am and 9.375 mg (1 1/2 tablets) in the pm 08/22/14  Yes Aundria Rud, NP  cetirizine Harless Nakayama)  10 MG tablet Take 10 mg by mouth daily as needed for allergies.   Yes Historical Provider, MD  furosemide (LASIX) 40 MG tablet Take 1 tablet (40 mg total) by mouth 2 (two) times daily. 08/22/14  Yes Aundria Rud, NP  isosorbide-hydrALAZINE (BIDIL) 20-37.5 MG per tablet Take 1 tablet by mouth daily.   Yes Historical Provider, MD  nitroGLYCERIN (NITROSTAT) 0.4 MG SL tablet Place 0.4 mg under the tongue every 5 (five) minutes as needed for chest pain.   Yes Historical Provider, MD  ticagrelor (BRILINTA) 90 MG TABS tablet Take 90 mg by mouth daily.   Yes Historical Provider, MD   BP 159/110  Pulse 98   Temp(Src) 98.7 F (37.1 C) (Oral)  Resp 20  Ht 5\' 5"  (1.651 m)  Wt 218 lb 14.4 oz (99.292 kg)  BMI 36.43 kg/m2  SpO2 97% Physical Exam  Nursing note and vitals reviewed. Constitutional: She is oriented to person, place, and time. She appears well-developed and well-nourished.  HENT:  Head: Normocephalic.  Eyes: Pupils are equal, round, and reactive to light.  Neck: Neck supple.  Cardiovascular: Normal rate and regular rhythm.   Pulmonary/Chest: Effort normal. She has no wheezes. She has no rales.  Abdominal: Soft. She exhibits distension.  Musculoskeletal: She exhibits edema. She exhibits no tenderness.  Lymphadenopathy:    She has no cervical adenopathy.  Neurological: She is alert and oriented to person, place, and time.  Skin: Skin is warm and dry.  Psychiatric: She has a normal mood and affect.    ED Course  Procedures (including critical care time) Labs Review Labs Reviewed  CBC - Abnormal; Notable for the following:    WBC 11.0 (*)    RBC 5.58 (*)    Hemoglobin 15.6 (*)    HCT 48.3 (*)    All other components within normal limits  BASIC METABOLIC PANEL - Abnormal; Notable for the following:    Glucose, Bld 133 (*)    GFR calc non Af Amer 57 (*)    GFR calc Af Amer 66 (*)    Anion gap 18 (*)    All other components within normal limits  PRO B NATRIURETIC PEPTIDE - Abnormal; Notable for the following:    Pro B Natriuretic peptide (BNP) 12687.0 (*)    All other components within normal limits  I-STAT TROPOININ, ED    Imaging Review Dg Chest 2 View  09/13/2014   CLINICAL DATA:  Shortness of breath with leg swelling. Recent heart catheterization  EXAM: CHEST  2 VIEW  COMPARISON:  08/13/2014  FINDINGS: A moderate right pleural effusion is unchanged from previous. Atelectatic changes again noted at the right base. The left lung is well aerated. No edema, visible pneumonia, or pneumothorax.  Moderate cardiomegaly.  Stable mild aortic tortuosity.  IMPRESSION: Moderate  right pleural effusion, stable from 08/13/2014.   Electronically Signed   By: 08/15/2014 M.D.   On: 09/13/2014 19:16     EKG Interpretation None     Lab and radiology results reviewed.  No ischemia noted on ecg.  BNP today 12687. Chest xray with stable right pleural effusion, no pulmonary edema. Discussed with on-call cardiology, who will see patient.  MDM   Final diagnoses:  SOB (shortness of breath)  CHF exacerbation. Cardiology to admit.       09/15/2014, NP 09/14/14 [phone]

## 2014-09-13 NOTE — ED Notes (Signed)
Pt has been having problems with edema to bilateral lower extremities. States she has had this problem since being discharged from here before. States the swelling was all the way up her legs but is now just from knees down. Dyspnea with exertion.

## 2014-09-13 NOTE — ED Provider Notes (Signed)
Pt has history of MI with subsequent development of CHF.  Taking her meds as outpatient.  Has been having progressive swelling of her legs.  Now with some dyspnea.  On my exam Crackles on lung exam.  Pitting edema bilateral LE.  IV lasix.  Plan on admission for further treatment.  Medical screening examination/treatment/procedure(s) were performed by non-physician practitioner and as supervising physician I was immediately available for consultation/collaboration.   EKG Interpretation   Date/Time:  Thursday September 13 2014 18:46:28 EDT Ventricular Rate:  100 PR Interval:  156 QRS Duration: 78 QT Interval:  382 QTC Calculation: 492 R Axis:   -85 Text Interpretation:  Normal sinus rhythm Left axis deviation Low voltage  QRS Inferior infarct , age undetermined Cannot rule out Anteroseptal  infarct , age undetermined Abnormal ECG No significant change since last  tracing Confirmed by Lael Wetherbee  MD-J, Sakai Wolford [id number]) on 09/13/2014 11:57:14 PM      Linwood Dibbles, MD 09/13/14 2357

## 2014-09-13 NOTE — Telephone Encounter (Signed)
Please call again.

## 2014-09-14 ENCOUNTER — Ambulatory Visit (HOSPITAL_COMMUNITY): Payer: No Typology Code available for payment source

## 2014-09-14 ENCOUNTER — Encounter (HOSPITAL_COMMUNITY): Payer: Self-pay | Admitting: *Deleted

## 2014-09-14 DIAGNOSIS — Z955 Presence of coronary angioplasty implant and graft: Secondary | ICD-10-CM | POA: Diagnosis not present

## 2014-09-14 DIAGNOSIS — Z9114 Patient's other noncompliance with medication regimen: Secondary | ICD-10-CM | POA: Diagnosis present

## 2014-09-14 DIAGNOSIS — F17210 Nicotine dependence, cigarettes, uncomplicated: Secondary | ICD-10-CM | POA: Diagnosis present

## 2014-09-14 DIAGNOSIS — I2510 Atherosclerotic heart disease of native coronary artery without angina pectoris: Secondary | ICD-10-CM

## 2014-09-14 DIAGNOSIS — I252 Old myocardial infarction: Secondary | ICD-10-CM | POA: Diagnosis not present

## 2014-09-14 DIAGNOSIS — K219 Gastro-esophageal reflux disease without esophagitis: Secondary | ICD-10-CM | POA: Diagnosis present

## 2014-09-14 DIAGNOSIS — I255 Ischemic cardiomyopathy: Secondary | ICD-10-CM

## 2014-09-14 DIAGNOSIS — E785 Hyperlipidemia, unspecified: Secondary | ICD-10-CM

## 2014-09-14 DIAGNOSIS — E876 Hypokalemia: Secondary | ICD-10-CM | POA: Diagnosis present

## 2014-09-14 DIAGNOSIS — Z599 Problem related to housing and economic circumstances, unspecified: Secondary | ICD-10-CM | POA: Diagnosis not present

## 2014-09-14 DIAGNOSIS — Z79899 Other long term (current) drug therapy: Secondary | ICD-10-CM | POA: Diagnosis not present

## 2014-09-14 DIAGNOSIS — R0602 Shortness of breath: Secondary | ICD-10-CM | POA: Diagnosis present

## 2014-09-14 DIAGNOSIS — I10 Essential (primary) hypertension: Secondary | ICD-10-CM

## 2014-09-14 DIAGNOSIS — I5023 Acute on chronic systolic (congestive) heart failure: Principal | ICD-10-CM

## 2014-09-14 MED ORDER — TICAGRELOR 90 MG PO TABS
90.0000 mg | ORAL_TABLET | Freq: Every day | ORAL | Status: DC
  Administered 2014-09-14 – 2014-09-17 (×4): 90 mg via ORAL
  Filled 2014-09-14 (×4): qty 1

## 2014-09-14 MED ORDER — ENOXAPARIN SODIUM 40 MG/0.4ML ~~LOC~~ SOLN
40.0000 mg | Freq: Every day | SUBCUTANEOUS | Status: DC
  Administered 2014-09-14 – 2014-09-19 (×6): 40 mg via SUBCUTANEOUS
  Filled 2014-09-14 (×6): qty 0.4

## 2014-09-14 MED ORDER — SODIUM CHLORIDE 0.9 % IJ SOLN: 3.0000 mL | INTRAMUSCULAR | Status: DC | PRN

## 2014-09-14 MED ORDER — ISOSORB DINITRATE-HYDRALAZINE 20-37.5 MG PO TABS
1.0000 | ORAL_TABLET | Freq: Two times a day (BID) | ORAL | Status: DC
  Administered 2014-09-14 (×2): 1 via ORAL
  Filled 2014-09-14 (×3): qty 1

## 2014-09-14 MED ORDER — CARVEDILOL 6.25 MG PO TABS
6.2500 mg | ORAL_TABLET | Freq: Two times a day (BID) | ORAL | Status: DC
  Administered 2014-09-14 – 2014-09-19 (×11): 6.25 mg via ORAL
  Filled 2014-09-14 (×13): qty 1

## 2014-09-14 MED ORDER — ISOSORBIDE DINITRATE 20 MG PO TABS
20.0000 mg | ORAL_TABLET | Freq: Three times a day (TID) | ORAL | Status: DC
  Administered 2014-09-14 – 2014-09-19 (×15): 20 mg via ORAL
  Filled 2014-09-14 (×17): qty 1

## 2014-09-14 MED ORDER — ACETAMINOPHEN 325 MG PO TABS
650.0000 mg | ORAL_TABLET | ORAL | Status: DC | PRN
  Administered 2014-09-14 – 2014-09-19 (×2): 650 mg via ORAL
  Filled 2014-09-14 (×3): qty 2

## 2014-09-14 MED ORDER — NITROGLYCERIN 0.4 MG SL SUBL: 0.4000 mg | SUBLINGUAL_TABLET | SUBLINGUAL | Status: DC | PRN

## 2014-09-14 MED ORDER — FUROSEMIDE 10 MG/ML IJ SOLN
40.0000 mg | Freq: Two times a day (BID) | INTRAMUSCULAR | Status: DC
  Administered 2014-09-14 – 2014-09-15 (×3): 40 mg via INTRAVENOUS
  Filled 2014-09-14 (×6): qty 4

## 2014-09-14 MED ORDER — ATORVASTATIN CALCIUM 40 MG PO TABS
40.0000 mg | ORAL_TABLET | Freq: Every day | ORAL | Status: DC
  Administered 2014-09-14 – 2014-09-18 (×5): 40 mg via ORAL
  Filled 2014-09-14 (×6): qty 1

## 2014-09-14 MED ORDER — SODIUM CHLORIDE 0.9 % IV SOLN: 250.0000 mL | INTRAVENOUS | Status: DC | PRN

## 2014-09-14 MED ORDER — SPIRONOLACTONE 25 MG PO TABS
25.0000 mg | ORAL_TABLET | Freq: Every day | ORAL | Status: DC
  Administered 2014-09-14 – 2014-09-19 (×6): 25 mg via ORAL
  Filled 2014-09-14 (×6): qty 1

## 2014-09-14 MED ORDER — HYDRALAZINE HCL 25 MG PO TABS
25.0000 mg | ORAL_TABLET | Freq: Three times a day (TID) | ORAL | Status: DC
  Administered 2014-09-14 – 2014-09-19 (×16): 25 mg via ORAL
  Filled 2014-09-14 (×18): qty 1

## 2014-09-14 MED ORDER — SODIUM CHLORIDE 0.9 % IJ SOLN
3.0000 mL | Freq: Two times a day (BID) | INTRAMUSCULAR | Status: DC
  Administered 2014-09-14 – 2014-09-19 (×12): 3 mL via INTRAVENOUS

## 2014-09-14 NOTE — H&P (Addendum)
History and Physical  Patient ID: Ridhima Golberg MRN: [id number], SOB: [birth year] 63 y.o. Date of Encounter: 09/14/2014, 12:33 AM  Primary Physician: No PCP Per Patient Primary Cardiologist: [place], [place]  Chief Complaint: LE swelling, DOE  HPI: 63 y.o. female w/ PMHx significant for CAD s/p MI 2015, ischemic CMP with EF of 15% 07/2014 who presented to [facility] on 09/14/2014 with complaints of LE swelling and DOE. Last seen by Elsie Ra on 10/7 regarding treatment of her CHF. Notes indicate that there was a concern that she did not absorb much of the information. Recent phone call notes also re-iterate this concern (surprised at this "new" diagnosis of CHF). At that appointment, her lasix was doubled, Bidil ordered and coreg increased. She only increased to BID lasix on the weekends due to the inconvenience of the diuretic at work. She did not increase the coreg. She did not pick up the Rx for bidil. Unclear how consistently she takes spironlactone. She endorses consistent use of brilinta and aspirin.  She does not consistently check her weight at home. However, by the ER scales, she is up 10-15 lbs. She reports increased swelling of the legs and up into the thighs (and is confused to why this is and what this diagnosis of CHF means). Her exercise tolerance is severely diminished, now difficulty with such a few feet. No chest pain. Has been occurring slowly. Is somewhat aware of dietary restrictions regarding fluid intake and salt and reports abiding by the recommendations.  In the ER, was given 40 mg IV lasix just recently. Effect yet to be seen.  EKG revealed NSR with inf and anterior Q waves; unchanged from prior. CXR demonstrated stable R pleural effusion. Labs [place] 16[address], up from 3000.   Past Medical History  Diagnosis Date  . GERD (gastroesophageal reflux disease)     Probable  . CAD (coronary artery disease) 05/03/14; 05/09/14    a. anterior STEMI with  early in-stent thorombosis for missed dose of Brillinta s/p PCI with DESx 2 into LAD (04/2014)  . HTN (hypertension)   . Hyperlipidemia   . Tobacco use   . Non compliance w medication regimen   . Ischemic cardiomyopathy     a. 04/2014 ECHO with EF 45-50% b.  Repeat 2D echo 08/14/14 with EF down at 15%. Life vest placed     Surgical History:  Past Surgical History  Procedure Laterality Date  . Partial hysterectomy    . Cholecystectomy    . Coronary angioplasty with stent placement  05/03/14    STEMI- stent to LAD DES- Xience alpine  . Coronary angioplasty with stent placement  05/09/14    STEMI- overlapping stent to LAD, pt had missed a dose of Brilinta     Home Meds: Prior to Admission medications   Medication Sig Start Date End Date Taking? Authorizing Provider  atorvastatin (LIPITOR) 80 MG tablet Take 40 mg by mouth daily at 6 PM. 05/06/14  Yes Dwana Melena, PA-C  carvedilol (COREG) 6.25 MG tablet Take 6.25 mg (1 tablets) in the am and 9.375 mg (1 1/2 tablets) in the pm 08/22/14  Yes Aundria Rud, NP  cetirizine (ZYRTEC) 10 MG tablet Take 10 mg by mouth daily as needed for allergies.   Yes Historical Provider, MD  furosemide (LASIX) 40 MG tablet Take 1 tablet (40 mg total) by mouth 2 (two) times daily. 08/22/14  Yes Aundria Rud, NP  isosorbide-hydrALAZINE (BIDIL) 20-37.5 MG per tablet Take 1  tablet by mouth daily.   Yes Historical Provider, MD  nitroGLYCERIN (NITROSTAT) 0.4 MG SL tablet Place 0.4 mg under the tongue every 5 (five) minutes as needed for chest pain.   Yes Historical Provider, MD  ticagrelor (BRILINTA) 90 MG TABS tablet Take 90 mg by mouth daily.   Yes Historical Provider, MD    Allergies:  Allergies  Allergen Reactions  . Aspirin Swelling    Chewable children's aspirin makes patients tongue and face swell  . Effient [Prasugrel] Swelling    Patient's tongue and face swells  . Lactose Intolerance (Gi) Other (See Comments)    REACTION: stomach upset  . Robitussin  Dm [Guaifenesin-Dm] Swelling    Patient's tongue swells  . Sulfa Antibiotics Swelling  . Wheat Bran Other (See Comments)    REACTION: unknown    History   Social History  . Marital Status: Widowed    Spouse Name: N/A    Number of Children: N/A  . Years of Education: N/A   Occupational History  . not employed    Social History Main Topics  . Smoking status: Current Every Day Smoker -- 0.10 packs/day for .5 years    Types: Cigarettes  . Smokeless tobacco: Never Used     Comment: down to 3 cigarettes daily (08/03/14)  . Alcohol Use: No  . Drug Use: No  . Sexual Activity: Not on file   Other Topics Concern  . Not on file   Social History Narrative   Patient has 6 brothers and sisters and none have known coronary artery disease. She lives alone in [place], but has several brothers in the area.     Family History  Problem Relation Age of Onset  . Diabetes Mother   . Hypertension Mother     Review of Systems: General: negative for chills, fever, night sweats Cardiovascular: see HPI Dermatological: negative for rash Respiratory: +DoE Urologic: negative for hematuria Abdominal: negative for nausea, vomiting, diarrhea, bright red blood per rectum, melena, or hematemesis Neurologic: negative for visual changes, syncope, or dizziness All other systems reviewed and are otherwise negative except as noted above.  Labs:   Lab Results  Component Value Date   WBC 11.0* 09/13/2014   HGB 15.6* 09/13/2014   HCT 48.3* 09/13/2014   MCV 86.6 09/13/2014   PLT 237 09/13/2014    Recent Labs Lab 09/13/14 1853  NA 144  K 3.7  CL 107  CO2 19  BUN 17  CREATININE 1.02  CALCIUM 9.0  GLUCOSE 133*   No results found for this basename: CKTOTAL, CKMB, TROPONINI,  in the last 72 hours Lab Results  Component Value Date   CHOL 209* 07/04/2014   HDL 48 07/04/2014   LDLCALC 127* 07/04/2014   TRIG 171* 07/04/2014   No results found for this basename: DDIMER    Radiology/Studies:   Dg Chest 2 View  09/13/2014   CLINICAL DATA:  Shortness of breath with leg swelling. Recent heart catheterization  EXAM: CHEST  2 VIEW  COMPARISON:  08/13/2014  FINDINGS: A moderate right pleural effusion is unchanged from previous. Atelectatic changes again noted at the right base. The left lung is well aerated. No edema, visible pneumonia, or pneumothorax.  Moderate cardiomegaly.  Stable mild aortic tortuosity.  IMPRESSION: Moderate right pleural effusion, stable from 08/13/2014.   Electronically Signed   By: Tiburcio Pea M.D.   On: 09/13/2014 19:16     EKG: see HPI  Physical Exam: Blood pressure 159/110, pulse 98, temperature 98.7 F (  37.1 C), temperature source Oral, resp. rate 20, height 5\' 5"  (1.651 m), weight 99.292 kg (218 lb 14.4 oz), SpO2 97.00%. General: Well developed, well nourished, in no acute distress. Head: Normocephalic, atraumatic, sclera non-icteric, nares are without discharge Neck: Supple. Negative for carotid bruits. JVD difficult to see but appears at least to mid neck at 80 deg. Lungs: decreased at R base. Heart: RRR with S1 S2. No murmurs, rubs, or gallops appreciated. Abdomen: Soft, non-tender, non-distended with normoactive bowel sounds. No rebound/guarding. No obvious abdominal masses. Msk:  Strength and tone appear normal for age. Extremities: +3 pitting edema to knee. Ext warm and dry. No cyanosis. Distal pedal pulses are 2+ and equal bilaterally. Neuro: Alert and oriented X 3. Moves all extremities spontaneously. Psych:  Responds to questions appropriately with a normal affect.    ASSESSMENT AND PLAN:  Problem List 1. Acute on chronic systolic heart failure, R and L symptoms 2. Ischemic CMP, EF ~15% by echo 07/2014 3. Medication noncompliance, ?confusion vs. Denial 4. HTN, poorly controlled 5. Hyperlipidemia 6. Tobacco abuse  63 y.o. female w/ PMHx significant for CAD s/p MI 2015, ischemic CMP with EF of 15% 07/2014 who presented to [facility] on 09/14/2014 with complaints of LE swelling and DOE --> acute on chronic systolic heart failure.  Clearly caused by medication noncompliance. Pt clearly is not accepting of this diagnosis and her understanding of her disease process is extremely limited which is leading to this admission. Clear goal of this admission is further CHF education and re-inforcement.  Ms. 09/16/2014 note from 10/7 does an excellent job of outlining what is needed.  -increased diuretic for goal out of -2 l/day, ~goal weight of ~195-200 lbs. (IV for now) -continue coreg -continue (or restart?) spironlactone -start bidil as previously planned. (No ACEI due to concerns of potential allergy) -continued education/reinforcement  Blood pressure poorly controlled. Likely volume and should improved with diuresis and addition of bidil.   Continue brilinta and statin for CAD.  Smoking cessation strongly encouraged.  Lovenox for prophylaxis Taking PO. Full code.   Signed, 12/7, Maxine Fredman C. MD 09/14/2014, 12:33 AM

## 2014-09-14 NOTE — Progress Notes (Signed)
TELEMETRY: Reviewed telemetry pt in NSR, there is a lot of artifact on tracing: Filed Vitals:   09/14/14 0217 09/14/14 0222 09/14/14 0613 09/14/14 0851  BP:  146/115 133/89 113/72  Pulse: 97  91 87  Temp: 98.4 F (36.9 C)  98.3 F (36.8 C)   TempSrc: Oral  Oral   Resp: 18  17   Height: 5\' 5"  (1.651 m)     Weight: 215 lb 14.4 oz (97.932 kg)     SpO2: 100%  96%     Intake/Output Summary (Last 24 hours) at 09/14/14 1139 Last data filed at 09/14/14 0933  Gross per 24 hour  Intake    843 ml  Output    350 ml  Net    493 ml   Filed Weights   09/13/14 1905 09/14/14 0217  Weight: 218 lb 14.4 oz (99.292 kg) 215 lb 14.4 oz (97.932 kg)    Subjective Feels bloated. SOB is better. No chest pain.  09/16/14 atorvastatin  40 mg Oral q1800  . carvedilol  6.25 mg Oral BID WC  . enoxaparin (LOVENOX) injection  40 mg Subcutaneous Daily  . furosemide  40 mg Intravenous BID  . isosorbide-hydrALAZINE  1 tablet Oral BID  . sodium chloride  3 mL Intravenous Q12H  . spironolactone  25 mg Oral Daily  . ticagrelor  90 mg Oral Daily      LABS: Basic Metabolic Panel:  Recent Labs  Marland Kitchen 1853  NA 144  K 3.7  CL 107  CO2 19  GLUCOSE 133*  BUN 17  CREATININE 1.02  CALCIUM 9.0   Liver Function Tests: No results found for this basename: AST, ALT, ALKPHOS, BILITOT, PROT, ALBUMIN,  in the last 72 hours No results found for this basename: LIPASE, AMYLASE,  in the last 72 hours CBC:  Recent Labs  09/13/14 1853  WBC 11.0*  HGB 15.6*  HCT 48.3*  MCV 86.6  PLT 237   Cardiac Enzymes: No results found for this basename: CKTOTAL, CKMB, CKMBINDEX, TROPONINI,  in the last 72 hours BNP:  Recent Labs  09/13/14 1854  PROBNP 12687.0*   D-Dimer: No results found for this basename: DDIMER,  in the last 72 hours Hemoglobin A1C: No results found for this basename: HGBA1C,  in the last 72 hours Fasting Lipid Panel: No results found for this basename: CHOL, HDL, LDLCALC, TRIG, CHOLHDL,  LDLDIRECT,  in the last 72 hours Thyroid Function Tests: No results found for this basename: TSH, T4TOTAL, FREET3, T3FREE, THYROIDAB,  in the last 72 hours   Radiology/Studies:  Dg Chest 2 View  09/13/2014   CLINICAL DATA:  Shortness of breath with leg swelling. Recent heart catheterization  EXAM: CHEST  2 VIEW  COMPARISON:  08/13/2014  FINDINGS: A moderate right pleural effusion is unchanged from previous. Atelectatic changes again noted at the right base. The left lung is well aerated. No edema, visible pneumonia, or pneumothorax.  Moderate cardiomegaly.  Stable mild aortic tortuosity.  IMPRESSION: Moderate right pleural effusion, stable from 08/13/2014.   Electronically Signed   By: 08/15/2014 M.D.   On: 09/13/2014 19:16   Ecg: NSR with old extensive anterior infarct.  PHYSICAL EXAM General: Well developed, obese, in no acute distress. Head: Normocephalic, atraumatic, sclera non-icteric, oropharynx is clear Neck: Negative for carotid bruits. JVD elevated to 10 cm. No adenopathy Lungs: Bilateral rales. Diminished BS right base Heart: RRR S1 S2 without murmurs, rubs, or gallops.  Abdomen: Soft, non-tender, non-distended with normoactive bowel sounds.  No hepatomegaly. No rebound/guarding. No obvious abdominal masses. Msk:  Strength and tone appears normal for age. Extremities: 3+ edema.  Distal pedal pulses are 2+ and equal bilaterally. Neuro: Alert and oriented X 3. Moves all extremities spontaneously.   ASSESSMENT AND PLAN: 1. Acute on chronic systolic CHF. EF 15% due to ischemic CM and extensive anterior MI. Patient noncompliant with medications. She is at least 10 lbs up. Needs continued diuresis. Reports she was unable to afford Bidil so didn't pick it up. Will split into component isordil and hydralazine which should be more affordable. History of ?angioedema on ACEi/ARB. Continue Coreg and aldactone. Continue IV lasix.  2. CAD s/p anterior MI with early stent trombosis and  repeat PCI. Continue ASA and Brilinta.  3. HTN poor control- improving.  4. Hyperlipidemia continue high dose statin therapy.  I am concerned about Mrs. Girton's lack of understanding of her disease process and its treatment despite extensive teaching in the hospital and in the outpatient setting including the CHF clinic. She really has no understanding of her heart disease and the goals of therapy. She has repeatedly changed her medication depending on how she is feeling. She states she doesn't know "why this is happening to me". I spent over 30 minutes today reviewing her history of MI with resultant weakening of her heart with development of CHF. Discussed rationale for her medications to eliminate fluid build up and attempt for her heart to pump more efficiently. I think going forward it is going to be a challenge to manage her due to this fundamental  lack of understanding.     Present on Admission:  . Acute on chronic systolic CHF (congestive heart failure)  Signed, Peter [place], MDFACC 09/14/2014 11:39 AM

## 2014-09-14 NOTE — Plan of Care (Signed)
Problem: Phase I Progression Outcomes Goal: Hemodynamically stable Outcome: Progressing Patient admitted to floor from [facility] ED for further management of shortness of breath and bilateral leg swelling.  She arrived via stretcher, in no apparent distress.  Placed patient on telemetry and oriented her to room and floor procedures.  Heart failure education handouts provided, education started.  Will continue to monitor patient's condition.

## 2014-09-14 NOTE — Progress Notes (Signed)
Pt insists that the meds she was taking at home (Bidil & Coreg) increase BLE edema.  Pt states she was only taking Lasix once a day, even though it was prescribed BID.  Educated her about the importance of taking lasix as prescribed and that missing doses will increase BLE edema .  She continues to insist that the coreg and bidil are the meds that increase her swelling.  Advised pt to elevate legs to help decrease swelling.  Pt is irritated and says that something needs to be done other than "taking all this medicine all the time".  Emotional support given to pt.  Will continue to educate about CHF/meds.

## 2014-09-15 LAB — BASIC METABOLIC PANEL
Anion gap: 17 — ABNORMAL HIGH (ref 5–15)
BUN: 21 mg/dL (ref 6–23)
CO2: 24 mEq/L (ref 19–32)
Calcium: 8.6 mg/dL (ref 8.4–10.5)
Chloride: 105 mEq/L (ref 96–112)
Creatinine, Ser: 1.13 mg/dL — ABNORMAL HIGH (ref 0.50–1.10)
GFR calc Af Amer: 59 mL/min — ABNORMAL LOW (ref >90)
GFR calc non Af Amer: 51 mL/min — ABNORMAL LOW (ref >90)
Glucose, Bld: 79 mg/dL (ref 70–99)
Potassium: 3.7 mEq/L (ref 3.7–5.3)
Sodium: 146 mEq/L (ref 137–147)

## 2014-09-15 MED ORDER — BENZONATATE 100 MG PO CAPS
100.0000 mg | ORAL_CAPSULE | Freq: Once | ORAL | Status: AC
  Administered 2014-09-15: 100 mg via ORAL
  Filled 2014-09-15: qty 1

## 2014-09-15 MED ORDER — FUROSEMIDE 10 MG/ML IJ SOLN: 80.0000 mg | Freq: Two times a day (BID) | INTRAMUSCULAR | Status: DC

## 2014-09-15 MED ORDER — FUROSEMIDE 10 MG/ML IJ SOLN
80.0000 mg | Freq: Two times a day (BID) | INTRAMUSCULAR | Status: AC
  Administered 2014-09-15 – 2014-09-16 (×2): 80 mg via INTRAVENOUS

## 2014-09-15 NOTE — Progress Notes (Signed)
       Patient Name: Deborah Ho Date of Encounter: 09/15/2014    SUBJECTIVE: Deborah Ho is very for me to me from her prior admission. She has had stent thrombosis. She has severe left ventricular systolic dysfunction. I am not sure she understands the concept of fluid restriction. I am also unsure if she has been weighing herself as requested. She is improved now with aggressive IV diuresis. She still has some dyspnea.  TELEMETRY:  Normal sinus rhythm Filed Vitals:   09/14/14 1426 09/14/14 1549 09/14/14 2007 09/15/14 0422  BP: 132/65 111/65 123/72 120/66  Pulse: 90 87 84 88  Temp:  98 F (36.7 C) 97.8 F (36.6 C) 98.2 F (36.8 C)  TempSrc:  Oral Oral Oral  Resp:  18 18 18   Height:      Weight:    213 lb 11.2 oz (96.934 kg)  SpO2: 95% 98% 98% 98%    Intake/Output Summary (Last 24 hours) at 09/15/14 1144 Last data filed at 09/15/14 0931  Gross per 24 hour  Intake    960 ml  Output   1650 ml  Net   -690 ml   Net intake versus output: -847 cc since admission LABS: Basic Metabolic Panel:  Recent Labs  09/17/14 1853 09/15/14 0400  NA 144 146  K 3.7 3.7  CL 107 105  CO2 19 24  GLUCOSE 133* 79  BUN 17 21  CREATININE 1.02 1.13*  CALCIUM 9.0 8.6   CBC:  Recent Labs  09/13/14 1853  WBC 11.0*  HGB 15.6*  HCT 48.3*  MCV 86.6  PLT 237   BNP    Component Value Date/Time   PROBNP 12687.0* 09/13/2014 1854    Fasting Lipid Panel: No results found for this basename: CHOL, HDL, LDLCALC, TRIG, CHOLHDL, LDLDIRECT,  in the last 72 hours  Radiology/Studies:  CHF on admission  Physical Exam: Blood pressure 120/66, pulse 88, temperature 98.2 F (36.8 C), temperature source Oral, resp. rate 18, height 5\' 5"  (1.651 m), weight 213 lb 11.2 oz (96.934 kg), SpO2 98.00%. Weight change: -5 lb 3.2 oz (-2.359 kg)  Wt Readings from Last 3 Encounters:  09/15/14 213 lb 11.2 oz (96.934 kg)  08/22/14 202 lb 12.8 oz (91.989 kg)  08/17/14 205 lb 14.6 oz (93.4 kg)     Decreased basilar breath sounds No paracardial friction rub. A gallop rhythm is heard. Edema bilateral lower extremities  ASSESSMENT:  1. Acute on chronic systolic heart failure secondary to ischemic cardiomyopathy and poor compliance with heart failure medication and management strategy 2. Coronary artery disease without recurrent ischemic symptoms 3. Poor blood pressure control   Plan:  1. Continue aggressive diuresis 2. Heart failure therapy adjustment 3. Time spent trying to educate the patient but clearly has poor understanding of the process.  10/22/14 W 09/15/2014, 11:44 AM

## 2014-09-15 NOTE — Progress Notes (Signed)
Pt c/o her legs are swelling educated pt on lying in the bed and elevating feet above the heart numerous times today. Verbalizes understanding however continues to sit in the chair with feet dependant.

## 2014-09-16 LAB — BASIC METABOLIC PANEL
Anion gap: 13 (ref 5–15)
BUN: 19 mg/dL (ref 6–23)
CO2: 27 mEq/L (ref 19–32)
Calcium: 8.6 mg/dL (ref 8.4–10.5)
Chloride: 102 mEq/L (ref 96–112)
Creatinine, Ser: 1.19 mg/dL — ABNORMAL HIGH (ref 0.50–1.10)
GFR calc Af Amer: 55 mL/min — ABNORMAL LOW (ref >90)
GFR calc non Af Amer: 48 mL/min — ABNORMAL LOW (ref >90)
Glucose, Bld: 113 mg/dL — ABNORMAL HIGH (ref 70–99)
Potassium: 3 mEq/L — ABNORMAL LOW (ref 3.7–5.3)
Sodium: 142 mEq/L (ref 137–147)

## 2014-09-16 NOTE — Progress Notes (Signed)
Pt up in chair watching TV with legs propped on bed.  Denies pain/discomfort.  Call bell at reach.  Instructed to call for help as needed.  Verbalized understanding.  Will continue to monitor.  Amanda Pea, Charity fundraiser.

## 2014-09-16 NOTE — Progress Notes (Signed)
       Patient Name: Deborah Ho Date of Encounter: 09/16/2014    SUBJECTIVE:Still with some shortness of breath but improving. Still with hot ideas contrary to the typical complaints the patient sat with heart failure. She feels her legs to better when they dangle rather than being elevated.  TELEMETRY:  Sinus rhythm Filed Vitals:   09/15/14 1000 09/15/14 1527 09/15/14 2005 09/16/14 0406  BP: 108/67 114/63 109/53 113/67  Pulse: 70 88 87 83  Temp:  97.5 F (36.4 C) 98.2 F (36.8 C) 98.4 F (36.9 C)  TempSrc:  Oral Oral Oral  Resp: 18  18 18   Height:      Weight:    210 lb 4.8 oz (95.391 kg)  SpO2: 98% 98% 95% 98%    Intake/Output Summary (Last 24 hours) at 09/16/14 1024 Last data filed at 09/16/14 1009  Gross per 24 hour  Intake    120 ml  Output   2850 ml  Net  -2730 ml  cumulative I/O:-3577 since admission LABS: Basic Metabolic Panel:  Recent Labs  13/01/15 0400 09/16/14 0355  NA 146 142  K 3.7 3.0*  CL 105 102  CO2 24 27  GLUCOSE 79 113*  BUN 21 19  CREATININE 1.13* 1.19*  CALCIUM 8.6 8.6   CBC:  Recent Labs  09/13/14 1853  WBC 11.0*  HGB 15.6*  HCT 48.3*  MCV 86.6  PLT 237     Radiology/Studies:  No new data  Physical Exam: Blood pressure 113/67, pulse 83, temperature 98.4 F (36.9 C), temperature source Oral, resp. rate 18, height 5\' 5"  (1.651 m), weight 210 lb 4.8 oz (95.391 kg), SpO2 98 %. Weight change: -3 lb 6.4 oz (-1.542 kg)  Wt Readings from Last 3 Encounters:  09/16/14 210 lb 4.8 oz (95.391 kg)  08/22/14 202 lb 12.8 oz (91.989 kg)  08/17/14 205 lb 14.6 oz (93.4 kg)    2+ bilateral lower extremity edema Decrease basal breath sounds S4 gallop  ASSESSMENT:  1. Acute on chronic systolic heart failure gradually improving 2. Coronary atherosclerotic heart disease without angina 3. Blood pressure control is improved  Plan:  Continue IV diuresis. Watch renal function.  10/22/14 09/16/2014, 10:24 AM

## 2014-09-17 DIAGNOSIS — E876 Hypokalemia: Secondary | ICD-10-CM | POA: Diagnosis not present

## 2014-09-17 LAB — BASIC METABOLIC PANEL
Anion gap: 14 (ref 5–15)
BUN: 17 mg/dL (ref 6–23)
CO2: 27 mEq/L (ref 19–32)
Calcium: 8.8 mg/dL (ref 8.4–10.5)
Chloride: 102 mEq/L (ref 96–112)
Creatinine, Ser: 1.08 mg/dL (ref 0.50–1.10)
GFR calc Af Amer: 62 mL/min — ABNORMAL LOW (ref >90)
GFR calc non Af Amer: 53 mL/min — ABNORMAL LOW (ref >90)
Glucose, Bld: 91 mg/dL (ref 70–99)
Potassium: 3.4 mEq/L — ABNORMAL LOW (ref 3.7–5.3)
Sodium: 143 mEq/L (ref 137–147)

## 2014-09-17 MED ORDER — FUROSEMIDE 10 MG/ML IJ SOLN
80.0000 mg | Freq: Two times a day (BID) | INTRAMUSCULAR | Status: DC
  Administered 2014-09-17 – 2014-09-19 (×5): 80 mg via INTRAVENOUS
  Filled 2014-09-17 (×5): qty 8

## 2014-09-17 MED ORDER — POTASSIUM CHLORIDE CRYS ER 20 MEQ PO TBCR
40.0000 meq | EXTENDED_RELEASE_TABLET | Freq: Two times a day (BID) | ORAL | Status: DC
  Administered 2014-09-17 – 2014-09-19 (×5): 40 meq via ORAL
  Filled 2014-09-17 (×6): qty 2

## 2014-09-17 MED ORDER — TICAGRELOR 90 MG PO TABS
90.0000 mg | ORAL_TABLET | Freq: Two times a day (BID) | ORAL | Status: DC
  Administered 2014-09-17 – 2014-09-19 (×4): 90 mg via ORAL
  Filled 2014-09-17 (×5): qty 1

## 2014-09-17 MED ORDER — MAGNESIUM HYDROXIDE 400 MG/5ML PO SUSP
30.0000 mL | Freq: Once | ORAL | Status: AC
  Administered 2014-09-17: 30 mL via ORAL
  Filled 2014-09-17: qty 30

## 2014-09-17 NOTE — Progress Notes (Signed)
     Patient Name: Deborah Ho Date of Encounter: 09/17/2014  Principal Problem:   Acute on chronic systolic CHF (congestive heart failure) Active Problems:   HTN (hypertension)   Hypokalemia    Patient Profile: 63 y.o. female w/ PMHx significant for CAD s/p MI 2015, ischemic CMP with EF of 15% 07/2014 admitted 09/14/2014 with LE swelling and DOE --> acute on chronic systolic heart failure.  SUBJECTIVE: Surprised by the fact that her legs swell when they are down for a while. Breathing better.    OBJECTIVE Filed Vitals:   09/16/14 1031 09/16/14 1432 09/16/14 2026 09/17/14 0437  BP: 128/81 118/68 118/80 134/72  Pulse: 70 86 85 84  Temp:  97.5 F (36.4 C) 98.1 F (36.7 C) 98.1 F (36.7 C)  TempSrc:  Oral Oral Oral  Resp: 18 18 18 18   Height:      Weight:    205 lb 4.8 oz (93.123 kg)  SpO2: 95% 96% 99% 98%    Intake/Output Summary (Last 24 hours) at 09/17/14 0920 Last data filed at 09/17/14 0857  Gross per 24 hour  Intake   1320 ml  Output   1250 ml  Net     70 ml   Filed Weights   09/15/14 0422 09/16/14 0406 09/17/14 0437  Weight: 213 lb 11.2 oz (96.934 kg) 210 lb 4.8 oz (95.391 kg) 205 lb 4.8 oz (93.123 kg)    PHYSICAL EXAM General: Well developed, well nourished, female in no acute distress. Head: Normocephalic, atraumatic.  Neck: Supple without bruits, JVD 9 cm. Lungs:  Resp regular and unlabored, bilateral rales. Heart: RRR, S1, S2, no S3, S4, soft murmur; no rub. Abdomen: Soft, non-tender, non-distended, BS + x 4.  Extremities: No clubbing, cyanosis, 2+ edema.  Neuro: Alert and oriented X 3. Moves all extremities spontaneously. Psych: Normal affect.  LABS: Basic Metabolic Panel:  Recent Labs  13/02/15 0355 09/17/14 0345  NA 142 143  K 3.0* 3.4*  CL 102 102  CO2 27 27  GLUCOSE 113* 91  BUN 19 17  CREATININE 1.19* 1.08  CALCIUM 8.6 8.8   BNP: PRO B NATRIURETIC PEPTIDE (BNP)  Date/Time Value Ref Range Status  09/13/2014 06:54 PM 12687.0* 0  - 125 pg/mL Final  08/22/2014 12:26 PM 3059.0* 0 - 125 pg/mL Final    TELE:  SR, much artifact, no clear arrhythmia seen  Current Medications:  . atorvastatin  40 mg Oral q1800  . carvedilol  6.25 mg Oral BID WC  . enoxaparin (LOVENOX) injection  40 mg Subcutaneous Daily  . hydrALAZINE  25 mg Oral 3 times per day  . isosorbide dinitrate  20 mg Oral TID  . sodium chloride  3 mL Intravenous Q12H  . spironolactone  25 mg Oral Daily  . ticagrelor  90 mg Oral Daily      ASSESSMENT AND PLAN: Principal Problem:   Acute on chronic systolic CHF (congestive heart failure) - continue IV Lasix, will order. Follow renal function, weights, I/O. Intake/output not strongly positive, but weight decreasing appropriately. Dry weight about 202, may be able to change to PO Lasix in am.    Active Problems:   HTN (hypertension) - improved with diuresis.    Hypokalemia - give 40 meq BID and follow.    Non-compliance - encouraged compliance w/ rx and low Na diet.  Plan - d/c when medically stable  Signed, 10/22/2014 , PA-C 9:20 AM 09/17/2014

## 2014-09-17 NOTE — Care Management Note (Signed)
    Page 1 of 1   09/17/2014     2:26:03 PM CARE MANAGEMENT NOTE 09/17/2014  Patient:  [facility]   Account Number:  [id number]  Date Initiated:  09/17/2014  Documentation initiated by:  Oletta Cohn  Subjective/Objective Assessment:   63 y.o. female w/ PMHx significant for CAD s/p MI 2015, ischemic CMP with EF of 15% 07/2014 who presented to [facility] on 09/14/2014 with complaints of LE swelling and DOE.//Home alone.     Action/Plan:   IV lasix.//Access for disposition needs.   Anticipated DC Date:  09/21/2014   Anticipated DC Plan:  HOME W HOME HEALTH SERVICES      DC Planning Services  CM consult      Choice offered to / List presented to:             Status of service:  Completed, signed off Medicare Important Message given?   (If response is "NO", the following Medicare IM given date fields will be blank) Date Medicare IM given:   Medicare IM given by:   Date Additional Medicare IM given:   Additional Medicare IM given by:    Discharge Disposition:    Per UR Regulation:  Reviewed for med. necessity/level of care/duration of stay  If discussed at Long Length of Stay Meetings, dates discussed:    Comments:

## 2014-09-18 LAB — BASIC METABOLIC PANEL
Anion gap: 12 (ref 5–15)
BUN: 14 mg/dL (ref 6–23)
CO2: 29 mEq/L (ref 19–32)
Calcium: 9.4 mg/dL (ref 8.4–10.5)
Chloride: 101 mEq/L (ref 96–112)
Creatinine, Ser: 1.09 mg/dL (ref 0.50–1.10)
GFR calc Af Amer: 61 mL/min — ABNORMAL LOW (ref >90)
GFR calc non Af Amer: 53 mL/min — ABNORMAL LOW (ref >90)
Glucose, Bld: 91 mg/dL (ref 70–99)
Potassium: 3.9 mEq/L (ref 3.7–5.3)
Sodium: 142 mEq/L (ref 137–147)

## 2014-09-18 MED ORDER — MAGNESIUM HYDROXIDE 400 MG/5ML PO SUSP
30.0000 mL | Freq: Once | ORAL | Status: AC
  Administered 2014-09-18: 30 mL via ORAL
  Filled 2014-09-18: qty 30

## 2014-09-18 NOTE — Progress Notes (Signed)
Patient Name: Deborah Ho Date of Encounter: 09/18/2014  Principal Problem:   Acute on chronic systolic CHF (congestive heart failure) Active Problems:   HTN (hypertension)   Hypokalemia    Patient Profile: 63 y.o. female w/ PMHx significant for CAD s/p MI 2015, ischemic CMP with EF of 15% 07/2014 admitted 09/14/2014 with LE swelling and DOE --> acute on chronic systolic heart failure. Hx noncompliance w/ meds and dietary restrictions.  SUBJECTIVE: Assures me she will take her medications as prescribed. Also believes she was told to take her Brilinta only once daily. Says breathing much better, says will take off work to make sure she is compliant with rx.  OBJECTIVE Filed Vitals:   09/17/14 1053 09/17/14 1431 09/17/14 2033 09/18/14 0607  BP: 102/64 125/84 128/76 130/66  Pulse: 83 87 88 92  Temp:  97.2 F (36.2 C) 98.1 F (36.7 C) 98.2 F (36.8 C)  TempSrc:  Oral Oral Oral  Resp: 16 16 18 18   Height:      Weight:    200 lb 6.4 oz (90.901 kg)  SpO2: 97% 99% 97% 96%    Intake/Output Summary (Last 24 hours) at 09/18/14 0819 Last data filed at 09/18/14 0500  Gross per 24 hour  Intake   1440 ml  Output   3260 ml  Net  -1820 ml   Filed Weights   09/16/14 0406 09/17/14 0437 09/18/14 0607  Weight: 210 lb 4.8 oz (95.391 kg) 205 lb 4.8 oz (93.123 kg) 200 lb 6.4 oz (90.901 kg)    PHYSICAL EXAM General: Well developed, well nourished, female in no acute distress. Head: Normocephalic, atraumatic.  Neck: Supple without bruits, JVD minimal elevation. Lungs:  Resp regular and unlabored, few rales bases. Heart: RRR, S1, S2, no S3, S4, soft murmur; no rub. Abdomen: Soft, non-tender, non-distended, BS + x 4.  Extremities: No clubbing, cyanosis, 2+ edema.  Neuro: Alert and oriented X 3. Moves all extremities spontaneously. Psych: Normal affect.  LABS: Basic Metabolic Panel: Recent Labs  09/17/14 0345 09/18/14 0539  NA 143 142  K 3.4* 3.9  CL 102 101  CO2 27 29    GLUCOSE 91 91  BUN 17 14  CREATININE 1.08 1.09  CALCIUM 8.8 9.4   BNP: PRO B NATRIURETIC PEPTIDE (BNP)  Date/Time Value Ref Range Status  09/13/2014 06:54 PM 12687.0* 0 - 125 pg/mL Final  08/22/2014 12:26 PM 3059.0* 0 - 125 pg/mL Final    TELE:  SR, no significant ectopy  Current Medications:  . atorvastatin  40 mg Oral q1800  . carvedilol  6.25 mg Oral BID WC  . enoxaparin (LOVENOX) injection  40 mg Subcutaneous Daily  . furosemide  80 mg Intravenous BID  . hydrALAZINE  25 mg Oral 3 times per day  . isosorbide dinitrate  20 mg Oral TID  . potassium chloride  40 mEq Oral BID  . sodium chloride  3 mL Intravenous Q12H  . spironolactone  25 mg Oral Daily  . ticagrelor  90 mg Oral BID      ASSESSMENT AND PLAN: Principal Problem:  Acute on chronic systolic CHF (congestive heart failure) - continue IV Lasix, will order. Follow renal function, weights, I/O. Intake/output net neg 6 L, weight decreasing appropriately. Think at/near Dry weight, consider change to PO Lasix.   Active Problems:  HTN (hypertension) - improved with diuresis.   Hypokalemia - continue 40 meq BID and follow, decrease when off IV Lasix   Non-compliance - encouraged compliance w/  rx and low Na diet.  Plan - d/c when medically stable  Signed, Theodore Demark , PA-C 8:19 AM 09/18/2014

## 2014-09-18 NOTE — Progress Notes (Signed)
Report given to receiving RN. Patient sitting in recliner and watching TV. No verbal complaints and no signs or symptoms of distress or discomfort. 

## 2014-09-18 NOTE — Plan of Care (Signed)
Problem: Phase I Progression Outcomes Goal: Dyspnea controlled at rest (HF) Outcome: Completed/Met Date Met:  09/18/14     

## 2014-09-19 ENCOUNTER — Other Ambulatory Visit: Payer: Self-pay | Admitting: Physician Assistant

## 2014-09-19 DIAGNOSIS — E876 Hypokalemia: Secondary | ICD-10-CM

## 2014-09-19 DIAGNOSIS — I5023 Acute on chronic systolic (congestive) heart failure: Secondary | ICD-10-CM

## 2014-09-19 LAB — BASIC METABOLIC PANEL
Anion gap: 13 (ref 5–15)
BUN: 16 mg/dL (ref 6–23)
CO2: 27 mEq/L (ref 19–32)
Calcium: 9.6 mg/dL (ref 8.4–10.5)
Chloride: 102 mEq/L (ref 96–112)
Creatinine, Ser: 1.18 mg/dL — ABNORMAL HIGH (ref 0.50–1.10)
GFR calc Af Amer: 56 mL/min — ABNORMAL LOW (ref >90)
GFR calc non Af Amer: 48 mL/min — ABNORMAL LOW (ref >90)
Glucose, Bld: 91 mg/dL (ref 70–99)
Potassium: 4.5 mEq/L (ref 3.7–5.3)
Sodium: 142 mEq/L (ref 137–147)

## 2014-09-19 MED ORDER — POTASSIUM CHLORIDE CRYS ER 20 MEQ PO TBCR: 20.0000 meq | EXTENDED_RELEASE_TABLET | Freq: Every day | ORAL | Status: DC

## 2014-09-19 MED ORDER — TICAGRELOR 90 MG PO TABS: 90.0000 mg | ORAL_TABLET | Freq: Two times a day (BID) | ORAL | Status: DC

## 2014-09-19 MED ORDER — ISOSORBIDE DINITRATE 20 MG PO TABS: 20.0000 mg | ORAL_TABLET | Freq: Three times a day (TID) | ORAL | Status: DC

## 2014-09-19 MED ORDER — HYDRALAZINE HCL 25 MG PO TABS: 25.0000 mg | ORAL_TABLET | Freq: Three times a day (TID) | ORAL | Status: DC

## 2014-09-19 MED ORDER — SPIRONOLACTONE 25 MG PO TABS: 25.0000 mg | ORAL_TABLET | Freq: Every day | ORAL | Status: DC

## 2014-09-19 MED ORDER — FUROSEMIDE 40 MG PO TABS
40.0000 mg | ORAL_TABLET | Freq: Two times a day (BID) | ORAL | Status: DC
  Filled 2014-09-19 (×2): qty 1

## 2014-09-19 NOTE — Progress Notes (Signed)
Patient Name: Deborah Ho Date of Encounter: 09/19/2014  Principal Problem:   Acute on chronic systolic CHF (congestive heart failure) Active Problems:   HTN (hypertension)   Hypokalemia    Patient Profile: Deborah Ho is a 63 y.o. female w/ PMHx significant for CAD s/p MI 2015, ischemic CMP with EF of 15% 07/2014 admitted 09/14/2014 with LE swelling and DOE --> acute on chronic systolic heart failure. Hx noncompliance w/ meds and dietary restrictions.  SUBJECTIVE: Denies any shortness of breath at rest or while walking around her room. Says she is worried about being on Lasix upon returning to work due to the frequent urination, but plans to take 2-3 weeks off work upon discharge to improve her medication compliance. Says she will continue to limit her sodium and fluid intake upon returning home. Feels compliance will be easier with a lower dose of Lasix BID.  OBJECTIVE Filed Vitals:   09/18/14 1507 09/18/14 1745 09/18/14 2052 09/19/14 0444  BP: 117/87 123/65 120/74 122/69  Pulse: 86 90 87 88  Temp:   97.7 F (36.5 C) 98 F (36.7 C)  TempSrc:   Oral Oral  Resp:   20 18  Height:      Weight:    194 lb 14.4 oz (88.406 kg)  SpO2:   93% 97%    Intake/Output Summary (Last 24 hours) at 09/19/14 0740 Last data filed at 09/19/14 0004  Gross per 24 hour  Intake    900 ml  Output   2900 ml  Net  -2000 ml   Filed Weights   09/17/14 0437 09/18/14 0607 09/19/14 0444  Weight: 205 lb 4.8 oz (93.123 kg) 200 lb 6.4 oz (90.901 kg) 194 lb 14.4 oz (88.406 kg)    PHYSICAL EXAM General: Well developed, well nourished, female in no acute distress. Head: Normocephalic, atraumatic.  Neck: Supple without bruits, JVD measured at 6-7cm. Lungs:  Resp regular and unlabored, CTA without wheezing or rales. Heart: RRR, S1, S2, no S3, S4, systolic ejection murmur present; no rub. Abdomen: Soft, non-tender, non-distended, BS + x 4.  Extremities: No clubbing, no cyanosis, 1+ pitting edema  up to mid-shins bilaterally, mostly medial. Distal pulses 2+. Neuro: Alert and oriented X 3. Moves all extremities spontaneously. Psych: Normal affect.  LABS:  Basic Metabolic Panel: Recent Labs  09/18/14 0539 09/19/14 0410  NA 142 142  K 3.9 4.5  CL 101 102  CO2 29 27  GLUCOSE 91 91  BUN 14 16  CREATININE 1.09 1.18*  CALCIUM 9.4 9.6   BNP: PRO B NATRIURETIC PEPTIDE (BNP)  Date/Time Value Ref Range Status  09/13/2014 06:54 PM 12687.0* 0 - 125 pg/mL Final  08/22/2014 12:26 PM 3059.0* 0 - 125 pg/mL Final   TELE:  Normal Sinus Rhythm without arrhythmias.      Current Medications:  . atorvastatin  40 mg Oral q1800  . carvedilol  6.25 mg Oral BID WC  . enoxaparin (LOVENOX) injection  40 mg Subcutaneous Daily  . furosemide  80 mg Intravenous BID  . hydrALAZINE  25 mg Oral 3 times per day  . isosorbide dinitrate  20 mg Oral TID  . potassium chloride  40 mEq Oral BID  . sodium chloride  3 mL Intravenous Q12H  . spironolactone  25 mg Oral Daily  . ticagrelor  90 mg Oral BID      ASSESSMENT AND PLAN:  1. Acute on chronic systolic CHF  - Dry weight thought to be ~ 195 lbs. Patient  was 218 lbs on admission and is now 194 lbs on 09/19/2014. - Net I/O since admission is -7.9 L.  - Continue Coreg 6.25 mg BID. - Continue Spironolactone 25mg  daily. - Continue Hydralazine 25mg  TID. - Continue Isordil 20mg  TID. - Switch Lasix 80mg  IV BID to Lasix 40mg  PO BID. Discussed strategies to help her tolerate Lasix as OP. Advised her that some of her meds are short-acting for cost savings. Encouraged compliance with meds, dietary restrictions and f/u appts.  2. CAD s/p anterior MI with early stent trombosis and repeat PCI - Continue Brilinta 90mg  BID.  3. Hypertension - BP has been 116/75 - 123/65 over past 24 hours. - Continue Coreg 6.25mg  BID.  4. Hypokalemia  - Potassium 4.5 on 09/19/2014 - Decrese potassium supplementation from BID to daily, recheck at f/u appt.  5.  Hyperlipidemia  - Continue Atorvastatin 40mg  daily.  6. Non-compliance  - encouraged compliance w/ Rx and low Sodium diet.  Plan: D/C today.  Signed, Patient seen and examined with PA-S . Changes made where appropriate.  , PA-C 7:40 AM 09/19/2014

## 2014-09-19 NOTE — Progress Notes (Signed)
DC IV, DC Tele, DC Home. Discharge instructions and home medications discussed with patient. Patient denied any questions or concerns at this time. Patient leaving unit via wheelchair and appears in no acute distress.  

## 2014-09-19 NOTE — Discharge Instructions (Signed)
Take your medicine. If you are having problems taking your medication, call us but do not stop anything unless you speak with the cardiology office first.

## 2014-09-19 NOTE — Discharge Summary (Signed)
CARDIOLOGY DISCHARGE SUMMARY   Patient ID: Deborah Ho MRN: [id number] DOB/AGE: August 12, 1951 63 y.o.  Admit date: 09/13/2014 Discharge date: 09/19/2014  PCP: No PCP Per Patient Primary Cardiologist:   Primary Discharge Diagnosis:   Acute on chronic systolic CHF (congestive heart failure) - weight at discharge 194.85 pounds  Secondary Discharge Diagnosis:    HTN (hypertension)   Hypokalemia   Medication noncompliance - due to side effects and financial issues  Procedures: 2 view chest x-ray  Hospital Course: Deborah Ho is a 63 y.o. female with a history of CAD and CHF. She was hospitalized 09/28 through 10/02 for CHF, her discharge weight was 205 pounds. She came to the emergency room on 10/34 shortness of breath and lower extremity edema. She admitted poor compliance with her medications because of the cost of the BiDil, and frequent urination associated with Lasix, possibly spironolactone. She had been changed from Effient to [facility] but did not understand that the Brilinta was to be taken twice a day and was only taking it once daily. She was volume overloaded and admitted for further evaluation and treatment.   She was diuresed with IV Lasix, and lost 14 pounds during her hospital stay. Her discharge weight was 194 pounds. Her renal function and electrolytes were followed closely during her hospital stay. Her potassium required extensive supplementation and this was done. Her blood pressure was monitored carefully and she tolerated diuresis well. As her volume status improved, her respiratory status improved. By discharge, she did not require supplemental oxygenation.  Long discussions were had with the patient regarding compliance with medications and dietary restrictions. She was having trouble affording some of her medications and the BiDil in particular. It was changed to the components which are much less expensive. Strategies to increase compliance with medication dosage  and timing were discussed with the patient. She was also strongly encouraged to contact our office if there was any reason she was not taking her medications as prescribed.  She has a history of CAD but no evidence of angina. She was continued on her home dose of beta blocker. She is not on aspirin because of an allergy. She was only taking Brilinta once a day but it was increased to twice a day. She was continued on her previous dose of Lipitor.  By 11/04, she was felt to be at or very close to her dry weight. Her respiratory status was much improved. She seemed motivated to improve her compliance with medications and dietary restrictions. She was evaluated by Dr. Rennis Golden and all data were reviewed. No further inpatient workup is indicated and she is considered stable for discharge, to follow up with a TOC appointment next week.  Labs:   Lab Results  Component Value Date   WBC 11.0* 09/13/2014   HGB 15.6* 09/13/2014   HCT 48.3* 09/13/2014   MCV 86.6 09/13/2014   PLT 237 09/13/2014     Recent Labs Lab 09/19/14 0410  NA 142  K 4.5  CL 102  CO2 27  BUN 16  CREATININE 1.18*  CALCIUM 9.6  GLUCOSE 91   PRO B NATRIURETIC PEPTIDE (BNP)  Date/Time Value Ref Range Status  09/13/2014 06:54 PM 12687.0* 0 - 125 pg/mL Final  08/22/2014 12:26 PM 3059.0* 0 - 125 pg/mL Final      Radiology: Dg Chest 2 View 09/13/2014   CLINICAL DATA:  Shortness of breath with leg swelling. Recent heart catheterization  EXAM: CHEST  2 VIEW  COMPARISON:  08/13/2014  FINDINGS: A moderate right pleural effusion is unchanged from previous. Atelectatic changes again noted at the right base. The left lung is well aerated. No edema, visible pneumonia, or pneumothorax.  Moderate cardiomegaly.  Stable mild aortic tortuosity.  IMPRESSION: Moderate right pleural effusion, stable from 08/13/2014.   Electronically Signed   By: Tiburcio Pea M.D.   On: 09/13/2014 19:16   EKG: 09/13/2014 Sinus rhythm/sinus tachycardia, no  acute ischemic changes Rate 100  FOLLOW UP PLANS AND APPOINTMENTS Allergies  Allergen Reactions  . Aspirin Swelling    Chewable children's aspirin makes patients tongue and face swell  . Effient [Prasugrel] Swelling    Patient's tongue and face swells  . Lactose Intolerance (Gi) Other (See Comments)    REACTION: stomach upset  . Robitussin Dm [Guaifenesin-Dm] Swelling    Patient's tongue swells  . Sulfa Antibiotics Swelling  . Wheat Bran Other (See Comments)    REACTION: unknown     Medication List    STOP taking these medications        isosorbide-hydrALAZINE 20-37.5 MG per tablet  Commonly known as:  BIDIL      TAKE these medications        atorvastatin 80 MG tablet  Commonly known as:  LIPITOR  Take 40 mg by mouth daily at 6 PM.     carvedilol 6.25 MG tablet  Commonly known as:  COREG  Take 6.25 mg (1 tablets) in the am and 9.375 mg (1 1/2 tablets) in the pm     cetirizine 10 MG tablet  Commonly known as:  ZYRTEC  Take 10 mg by mouth daily as needed for allergies.     furosemide 40 MG tablet  Commonly known as:  LASIX  Take 1 tablet (40 mg total) by mouth 2 (two) times daily.     hydrALAZINE 25 MG tablet  Commonly known as:  APRESOLINE  Take 1 tablet (25 mg total) by mouth every 8 (eight) hours.     isosorbide dinitrate 20 MG tablet  Commonly known as:  ISORDIL  Take 1 tablet (20 mg total) by mouth 3 (three) times daily.     nitroGLYCERIN 0.4 MG SL tablet  Commonly known as:  NITROSTAT  Place 0.4 mg under the tongue every 5 (five) minutes as needed for chest pain.     potassium chloride SA 20 MEQ tablet  Commonly known as:  K-DUR,KLOR-CON  Take 1 tablet (20 mEq total) by mouth daily.  Start taking on:  09/20/2014     spironolactone 25 MG tablet  Commonly known as:  ALDACTONE  Take 1 tablet (25 mg total) by mouth daily.     ticagrelor 90 MG Tabs tablet  Commonly known as:  BRILINTA  Take 1 tablet (90 mg total) by mouth 2 (two) times daily.         Discharge Instructions    (HEART FAILURE PATIENTS) Call MD:  Anytime you have any of the following symptoms: 1) 3 pound weight gain in 24 hours or 5 pounds in 1 week 2) shortness of breath, with or without a dry hacking cough 3) swelling in the hands, feet or stomach 4) if you have to sleep on extra pillows at night in order to breathe.    Complete by:  As directed      Diet - low sodium heart healthy    Complete by:  As directed      Increase activity slowly    Complete by:  As directed  Follow-up Information    Follow up with [facility], NP On 09/27/2014.   Specialty:  Cardiology   Why:  Keep appointment at 9:00 am   Contact information:   [address] [place] [address] [phone]       BRING ALL MEDICATIONS WITH YOU TO FOLLOW UP APPOINTMENTS  Time spent with patient to include physician time: 41 min Signed: Theodore Demark, PA-C 09/19/2014, 2:53 PM Co-Sign MD

## 2014-09-26 ENCOUNTER — Ambulatory Visit: Payer: Self-pay

## 2014-09-27 ENCOUNTER — Ambulatory Visit (INDEPENDENT_AMBULATORY_CARE_PROVIDER_SITE_OTHER): Payer: No Typology Code available for payment source | Admitting: Cardiology

## 2014-09-27 ENCOUNTER — Encounter: Payer: Self-pay | Admitting: Cardiology

## 2014-09-27 VITALS — BP 140/102 | HR 98 | Ht 65.0 in | Wt 193.6 lb

## 2014-09-27 DIAGNOSIS — I2510 Atherosclerotic heart disease of native coronary artery without angina pectoris: Secondary | ICD-10-CM

## 2014-09-27 DIAGNOSIS — I5023 Acute on chronic systolic (congestive) heart failure: Secondary | ICD-10-CM

## 2014-09-27 DIAGNOSIS — Z79899 Other long term (current) drug therapy: Secondary | ICD-10-CM

## 2014-09-27 DIAGNOSIS — Z720 Tobacco use: Secondary | ICD-10-CM

## 2014-09-27 DIAGNOSIS — I10 Essential (primary) hypertension: Secondary | ICD-10-CM

## 2014-09-27 DIAGNOSIS — I255 Ischemic cardiomyopathy: Secondary | ICD-10-CM

## 2014-09-27 LAB — BASIC METABOLIC PANEL
BUN: 20 mg/dL (ref 6–23)
CO2: 28 mEq/L (ref 19–32)
Calcium: 9.9 mg/dL (ref 8.4–10.5)
Chloride: 107 mEq/L (ref 96–112)
Creat: 1.24 mg/dL — ABNORMAL HIGH (ref 0.50–1.10)
Glucose, Bld: 78 mg/dL (ref 70–99)
Potassium: 4.6 mEq/L (ref 3.5–5.3)
Sodium: 141 mEq/L (ref 135–145)

## 2014-09-27 NOTE — Assessment & Plan Note (Signed)
Decreased use.  Plans to stop by end of the month.

## 2014-09-27 NOTE — Progress Notes (Signed)
09/27/2014   PCP: [name], PA-C   Chief Complaint  Patient presents with  . Follow-up    post hosp. for swelling. which has improved since discharge     Primary Cardiologist:Dr. P. Swaziland   HPI:Deborah Ho is seen for follow up today post hospitalization.   She is a 63 yo BF who is s/p Anterior STEMI on May 06, 2014 treated emergently with a DES. EF was reduced at 35%. She also had diffuse moderate disease in the proximal RCA. She was enrolled in the Complete trial and randomized to medical treatment arm. After DC she presented with recurrent STEMI with reocclusion of the LAD. She did miss one day of her Brilinta and ASA. She underwent repeat intervention with thrombectomy and extension of stent distal to prior stent. She was treated with ASA and Effient. Follow up Echo showed EF of 45-50%. She was started on lisinopril and Carvedilol.   On follow up today patient reports she is doing great. Her wt is same as discharge wt.  She is watching her salt.   No chest pain, no SOB.  Her BP and HR are elevated but she has not yet had her coreg.     Allergies  Allergen Reactions  . Aspirin Swelling    Chewable children's aspirin makes patients tongue and face swell  . Effient [Prasugrel] Swelling    Patient's tongue and face swells  . Lactose Intolerance (Gi) Other (See Comments)    REACTION: stomach upset  . Robitussin Dm [Guaifenesin-Dm] Swelling    Patient's tongue swells  . Sulfa Antibiotics Swelling  . Wheat Bran Other (See Comments)    REACTION: unknown    Current Outpatient Prescriptions  Medication Sig Dispense Refill  . atorvastatin (LIPITOR) 80 MG tablet Take 40 mg by mouth daily at 6 PM.    . carvedilol (COREG) 6.25 MG tablet Take 6.25 mg (1 tablets) in the am and 9.375 mg (1 1/2 tablets) in the pm 75 tablet 1  . cetirizine (ZYRTEC) 10 MG tablet Take 10 mg by mouth daily as needed for allergies.    . furosemide (LASIX) 40 MG tablet Take 1  tablet (40 mg total) by mouth 2 (two) times daily. 60 tablet 3  . hydrALAZINE (APRESOLINE) 25 MG tablet Take 1 tablet (25 mg total) by mouth every 8 (eight) hours. 90 tablet 11  . isosorbide dinitrate (ISORDIL) 20 MG tablet Take 1 tablet (20 mg total) by mouth 3 (three) times daily. 90 tablet 11  . nitroGLYCERIN (NITROSTAT) 0.4 MG SL tablet Place 0.4 mg under the tongue every 5 (five) minutes as needed for chest pain.    . potassium chloride SA (K-DUR,KLOR-CON) 20 MEQ tablet Take 1 tablet (20 mEq total) by mouth daily. 30 tablet 11  . spironolactone (ALDACTONE) 25 MG tablet Take 1 tablet (25 mg total) by mouth daily. 30 tablet 11  . ticagrelor (BRILINTA) 90 MG TABS tablet Take 1 tablet (90 mg total) by mouth 2 (two) times daily. 60 tablet 11   No current facility-administered medications for this visit.    Past Medical History  Diagnosis Date  . GERD (gastroesophageal reflux disease)     Probable  . CAD (coronary artery disease) 05/03/14; 05/09/14    a. anterior STEMI with early in-stent thorombosis for missed dose of Brillinta s/p PCI with DESx 2 into LAD (04/2014)  . HTN (hypertension)   . Hyperlipidemia   . Tobacco use   .  Non compliance w medication regimen   . Ischemic cardiomyopathy     a. 04/2014 ECHO with EF 45-50% b.  Repeat 2D echo 08/14/14 with EF down at 15%. Life vest placed    Past Surgical History  Procedure Laterality Date  . Partial hysterectomy    . Cholecystectomy    . Coronary angioplasty with stent placement  05/03/14    STEMI- stent to LAD DES- Xience alpine  . Coronary angioplasty with stent placement  05/09/14    STEMI- overlapping stent to LAD, pt had missed a dose of Brilinta    YWV:PXTGGYI:RS colds or fevers, no weight changes since discharge- sleeping on 1 pillow Skin:no rashes or ulcers HEENT:no blurred vision, no congestion CV:see HPI PUL:see HPI-- she has decreased her tobacco use down to 3 cigarettes a day and plans to stop by end of the month. GI:no  diarrhea constipation or melena, no indigestion GU:no hematuria, no dysuria MS:no joint pain, no claudication Neuro:no syncope, no lightheadedness Endo:no diabetes, no thyroid disease  Wt Readings from Last 3 Encounters:  09/27/14 193 lb 9.6 oz (87.816 kg)  09/19/14 194 lb 14.4 oz (88.406 kg)  08/22/14 202 lb 12.8 oz (91.989 kg)    PHYSICAL EXAM BP 140/102 mmHg  Pulse 98  Ht 5\' 5"  (1.651 m)  Wt 193 lb 9.6 oz (87.816 kg)  BMI 32.22 kg/m2 General:Pleasant affect, NAD Skin:Warm and dry, brisk capillary refill HEENT:normocephalic, sclera clear, mucus membranes moist Neck:supple, no JVD, no bruits  Heart:S1S2 RRR without murmur, gallup, rub or click Lungs:clear without rales, rhonchi, or wheezes , non tender, + BS, do not palpate liver spleen or masses Ext:no lower ext edema, 2+ pedal pulses, 2+ radial pulses Neuro:alert and oriented X 3, MAE, follows commands, + facial symmetry   ASSESSMENT AND PLAN Acute on chronic systolic CHF (congestive heart failure) Doing well post hospital.  Watching salt, weight staying stable.  She will follow up with WNI:OEVO, NP   Cardiomyopathy, ischemic, EF 30-35 % Echo 05/04/14. euvolemic today.  CAD (coronary artery disease) a. anterior STEMI with early in-stent thorombosis for missed dose of Brillinta s/p PCI with DESx 2 into LAD (04/2014) No chest pain.  HTN (hypertension) Has not had her meds this AM.  At home has been controlled checked per home health nurse.  Tobacco abuse Decreased use.  Plans to stop by end of the month.

## 2014-09-27 NOTE — Assessment & Plan Note (Signed)
Doing well post hospital.  Watching salt, weight staying stable.  She will follow up with Norma Fredrickson, NP

## 2014-09-27 NOTE — Assessment & Plan Note (Signed)
a. anterior STEMI with early in-stent thorombosis for missed dose of Brillinta s/p PCI with DESx 2 into LAD (04/2014) No chest pain.

## 2014-09-27 NOTE — Patient Instructions (Signed)
Your physician recommends that you schedule a follow-up appointment in: Keep appointment with Dr Tyrone Sage on 12/21  Your physician recommends that you return for lab work BMP

## 2014-09-27 NOTE — Assessment & Plan Note (Signed)
Has not had her meds this AM.  At home has been controlled checked per home health nurse.

## 2014-09-27 NOTE — Assessment & Plan Note (Signed)
euvolemic today.  

## 2014-10-01 ENCOUNTER — Telehealth: Payer: Self-pay | Admitting: Cardiology

## 2014-10-01 ENCOUNTER — Telehealth: Payer: Self-pay | Admitting: *Deleted

## 2014-10-01 DIAGNOSIS — R7989 Other specified abnormal findings of blood chemistry: Secondary | ICD-10-CM

## 2014-10-01 NOTE — Telephone Encounter (Signed)
-----   Message from Laura R Ingold, NP sent at 09/27/2014  5:26 PM EST ----- Recheck BMP on Tuesday, just to check cr. 

## 2014-10-01 NOTE — Telephone Encounter (Signed)
Left message to call back concerning lab results 

## 2014-10-01 NOTE — Telephone Encounter (Signed)
Unable to reach pt or leave a message  

## 2014-10-01 NOTE — Telephone Encounter (Signed)
Pt gained weight this week-end. She drink water,is she not suppose to drink water?

## 2014-10-02 NOTE — Telephone Encounter (Signed)
Left message on home answer machine and cell voice mail to call.

## 2014-10-03 ENCOUNTER — Telehealth: Payer: Self-pay | Admitting: *Deleted

## 2014-10-03 NOTE — Telephone Encounter (Signed)
Called pt on home and mobile number. No answer on either. 2 messages were previously left.

## 2014-10-03 NOTE — Telephone Encounter (Signed)
Pt is returning Sharon's call from yesterday

## 2014-10-03 NOTE — Telephone Encounter (Signed)
Patient called into office  She request a new prescription for furosemide - increase quantity  She states she takes 2 tablets a a day now. RN informed patient that a new prescription for furosemide 40 mg twice a day sent on 10 /07/15 with confirmation  RN asked patient to contact pharmacy - to see if medication was on file if not call back. She verbalized understanding.

## 2014-10-03 NOTE — Telephone Encounter (Signed)
Spoke with patient  She states she gained weight over the weekend but sstates she spoke to someone concerning it She sates she only drinking about 20 oz of water a day  RN informed patient that to continue with that amount - the concerns is a weight gain of 3-5 lbs overnight with a change in food or liquid intake. Patient states understanding.

## 2014-10-10 NOTE — Telephone Encounter (Signed)
-----   Message from Leone Brand, NP sent at 09/27/2014  5:26 PM EST ----- Recheck BMP on Tuesday, just to check cr.

## 2014-10-10 NOTE — Telephone Encounter (Signed)
I spoke with patient and notified her of the test results.  She will have blood redrawn on Monday, 11/30

## 2014-10-16 LAB — BASIC METABOLIC PANEL
BUN: 16 mg/dL (ref 6–23)
CO2: 30 mEq/L (ref 19–32)
Calcium: 9.1 mg/dL (ref 8.4–10.5)
Chloride: 106 mEq/L (ref 96–112)
Creat: 1.04 mg/dL (ref 0.50–1.10)
Glucose, Bld: 71 mg/dL (ref 70–99)
Potassium: 4.4 mEq/L (ref 3.5–5.3)
Sodium: 143 mEq/L (ref 135–145)

## 2014-10-22 ENCOUNTER — Encounter (HOSPITAL_COMMUNITY): Payer: No Typology Code available for payment source

## 2014-10-25 ENCOUNTER — Encounter (HOSPITAL_COMMUNITY): Payer: Self-pay | Admitting: Cardiology

## 2014-11-05 ENCOUNTER — Encounter: Payer: Self-pay | Admitting: Nurse Practitioner

## 2014-11-05 ENCOUNTER — Ambulatory Visit (INDEPENDENT_AMBULATORY_CARE_PROVIDER_SITE_OTHER): Payer: No Typology Code available for payment source | Admitting: Nurse Practitioner

## 2014-11-05 VITALS — BP 112/78 | HR 92 | Ht 65.0 in | Wt 196.0 lb

## 2014-11-05 DIAGNOSIS — R0600 Dyspnea, unspecified: Secondary | ICD-10-CM

## 2014-11-05 DIAGNOSIS — I255 Ischemic cardiomyopathy: Secondary | ICD-10-CM

## 2014-11-05 DIAGNOSIS — Z79899 Other long term (current) drug therapy: Secondary | ICD-10-CM

## 2014-11-05 DIAGNOSIS — E785 Hyperlipidemia, unspecified: Secondary | ICD-10-CM

## 2014-11-05 LAB — HEPATIC FUNCTION PANEL
ALT: 10 U/L (ref 0–35)
AST: 16 U/L (ref 0–37)
Albumin: 3.8 g/dL (ref 3.5–5.2)
Alkaline Phosphatase: 109 U/L (ref 39–117)
Bilirubin, Direct: 0 mg/dL (ref 0.0–0.3)
Total Bilirubin: 0.5 mg/dL (ref 0.2–1.2)
Total Protein: 7 g/dL (ref 6.0–8.3)

## 2014-11-05 LAB — BASIC METABOLIC PANEL
BUN: 16 mg/dL (ref 6–23)
CO2: 29 mEq/L (ref 19–32)
Calcium: 9 mg/dL (ref 8.4–10.5)
Chloride: 101 mEq/L (ref 96–112)
Creatinine, Ser: 1 mg/dL (ref 0.4–1.2)
GFR: 70.34 mL/min (ref >60.00)
Glucose, Bld: 81 mg/dL (ref 70–99)
Potassium: 3.3 mEq/L — ABNORMAL LOW (ref 3.5–5.1)
Sodium: 140 mEq/L (ref 135–145)

## 2014-11-05 LAB — CBC
HCT: 44.7 % (ref 36.0–46.0)
Hemoglobin: 14.4 g/dL (ref 12.0–15.0)
MCHC: 32.3 g/dL (ref 30.0–36.0)
MCV: 85.8 fl (ref 78.0–100.0)
Platelets: 323 10*3/uL (ref 150.0–400.0)
RBC: 5.21 Mil/uL — ABNORMAL HIGH (ref 3.87–5.11)
RDW: 16 % — ABNORMAL HIGH (ref 11.5–15.5)
WBC: 11.6 10*3/uL — ABNORMAL HIGH (ref 4.0–10.5)

## 2014-11-05 MED ORDER — TICAGRELOR 90 MG PO TABS: 90.0000 mg | ORAL_TABLET | Freq: Two times a day (BID) | ORAL | Status: DC

## 2014-11-05 NOTE — Patient Instructions (Signed)
We will be checking the following labs today BMET, BNP, CBC and HPF  We need to get your echo updated  Follow up visit with Dr. Swaziland after the echo to discuss future treatment  Keep restricting your salt  Stay on your current medicines  Call the [facility] office at [phone] if you have any questions, problems or concerns.

## 2014-11-05 NOTE — Progress Notes (Signed)
Deborah Ho Date of Birth: 11/25/1950 Medical Record #[id number]  History of Present Illness: Deborah Ho is seen back today for a 1 month check. She is seen for Dr. Swaziland. She is a 63 y.o. female with a history of HTN, HLD, history of ongoing tobacco abuse, CAD s/p  anterior STEMI with early in-stent thorombosis for missed dose of Brillinta s/p PCI x 2 into LAD (04/2014) and ischemic CM (EF 45-50%) who presented to the [facility]13/14 with CHF symptoms and uncontrolled HTN. A repeat 2D echo 08/14/14 revealed her EF was down to 15%.  Patient initially presented as anterior STEMI on 05/03/2014. Cardiac catheterization that time showed a 95% proximal LAD stenosis, 30% proximal left circumflex stenosis, 20% mild ramus intermedius stenosis, 70-80% long segment RCA stenosis. Patient's ejection fraction was noted to be 35% that time. She was placed on aspirin and Brilinta and eventually discharged on 05/03/2014. Patient came back to the [facility] hospital the following week on 05/09/2014 with a recurrent anterior MI because she missed one day of her Brilinta. Cardiac catheterization done on 6/24 showed subacute stent thrombosis in mid LAD which was treated with overlapping drug-eluting stent at the distal edge of the previous stent. Her LVEDP was noted at 23 mmHg at the time. Interestingly, her echocardiogram obtained on 6/25 showed EF 45-50%, although her ejection fraction was noted to be 35% on cardiac catheterization.  She has had 2 heart failure admissions. Echo from September showed an EF of 15%. Review of her chart shows issues with non compliance and poor insight into her issues.  She was seen by Nada Boozer, NP a month ago.   Comes back today to see me. She is here alone. Says she is doing ok. Never got her potassium filled. Does not understand why she is on "all this medicine". Some bloating at times. Some dyspnea at times. Says she is restricting her salt but ate from [employer] this past weekend.  Does not sound like she weighs daily. Upset that she cannot lose weight. Says she knows nothing about a Technical sales engineer (this was offered several months ago). No showed for a CHF visit. No chest pain. Aspirin allergic. Still smoking a few cigarettes daily - not interested in stopping. Not dizzy or lightheaded. Not wanting to increase any doses of her medicines.   Current Outpatient Prescriptions  Medication Sig Dispense Refill  . atorvastatin (LIPITOR) 80 MG tablet Take 40 mg by mouth daily at 6 PM.    . carvedilol (COREG) 6.25 MG tablet Take 6.25 mg (1 tablets) in the am and 9.375 mg (1 1/2 tablets) in the pm 75 tablet 1  . cetirizine (ZYRTEC) 10 MG tablet Take 10 mg by mouth daily as needed for allergies.    . furosemide (LASIX) 40 MG tablet Take 1 tablet (40 mg total) by mouth 2 (two) times daily. 60 tablet 3  . hydrALAZINE (APRESOLINE) 25 MG tablet Take 1 tablet (25 mg total) by mouth every 8 (eight) hours. 90 tablet 11  . isosorbide dinitrate (ISORDIL) 20 MG tablet Take 1 tablet (20 mg total) by mouth 3 (three) times daily. 90 tablet 11  . nitroGLYCERIN (NITROSTAT) 0.4 MG SL tablet Place 0.4 mg under the tongue every 5 (five) minutes as needed for chest pain.    Marland Kitchen spironolactone (ALDACTONE) 25 MG tablet Take 1 tablet (25 mg total) by mouth daily. 30 tablet 11  . ticagrelor (BRILINTA) 90 MG TABS tablet Take 1 tablet (90 mg total) by mouth 2 (two)  times daily. 60 tablet 11  . potassium chloride SA (K-DUR,KLOR-CON) 20 MEQ tablet Take 1 tablet (20 mEq total) by mouth daily. (Patient not taking: Reported on 11/05/2014) 30 tablet 11   No current facility-administered medications for this visit.    Allergies  Allergen Reactions  . Aspirin Swelling    Chewable children's aspirin makes patients tongue and face swell  . Effient [Prasugrel] Swelling    Patient's tongue and face swells  . Lactose Intolerance (Gi) Other (See Comments)    REACTION: stomach upset  . Robitussin Dm [Guaifenesin-Dm] Swelling      Patient's tongue swells  . Sulfa Antibiotics Swelling  . Wheat Bran Other (See Comments)    REACTION: unknown    Past Medical History  Diagnosis Date  . GERD (gastroesophageal reflux disease)     Probable  . CAD (coronary artery disease) 05/03/14; 05/09/14    a. anterior STEMI with early in-stent thorombosis for missed dose of Brillinta s/p PCI with DESx 2 into LAD (04/2014)  . HTN (hypertension)   . Hyperlipidemia   . Tobacco use   . Non compliance w medication regimen   . Ischemic cardiomyopathy     a. 04/2014 ECHO with EF 45-50% b.  Repeat 2D echo 08/14/14 with EF down at 15%. Life vest placed    Past Surgical History  Procedure Laterality Date  . Partial hysterectomy    . Cholecystectomy    . Coronary angioplasty with stent placement  05/03/14    STEMI- stent to LAD DES- Xience alpine  . Coronary angioplasty with stent placement  05/09/14    STEMI- overlapping stent to LAD, pt had missed a dose of Brilinta  . Left heart catheterization with coronary angiogram N/A 05/03/2014    Procedure: LEFT HEART CATHETERIZATION WITH CORONARY ANGIOGRAM;  Surgeon: Peter M Swaziland, MD;  Location: [facility] CATH LAB;  Service: Cardiovascular;  Laterality: N/A;  . Percutaneous stent intervention  05/03/2014    Procedure: PERCUTANEOUS STENT INTERVENTION;  Surgeon: Peter M Swaziland, MD;  Location: [facility] CATH LAB;  Service: Cardiovascular;;  DES Prox LAD   . Left heart catheterization with coronary angiogram N/A 05/09/2014    Procedure: LEFT HEART CATHETERIZATION WITH CORONARY ANGIOGRAM;  Surgeon: Corky Crafts, MD;  Location: [facility] CATH LAB;  Service: Cardiovascular;  Laterality: N/A;    History  Smoking status  . Current Every Day Smoker -- 0.10 packs/day for .5 years  . Types: Cigarettes  Smokeless tobacco  . Current User    Comment: down to 3 cigarettes daily (08/03/14)    History  Alcohol Use No    Family History  Problem Relation Age of Onset  . Diabetes Mother   . Hypertension Mother      Review of Systems: The review of systems is per the HPI.  All other systems were reviewed and are negative.  Physical Exam: BP 112/78 mmHg  Pulse 92  Ht 5\' 5"  (1.651 m)  Wt 196 lb (88.905 kg)  BMI 32.62 kg/m2  SpO2 100% Patient is alert and in no acute distress. Seems to be very "perturbed" with my questions. Almost angry at times. Weight is up a few pounds. Skin is warm and dry. Color is normal.  HEENT is unremarkable. Normocephalic/atraumatic. PERRL. Sclera are nonicteric. Neck is supple. No masses. No JVD. Lungs are clear. Cardiac exam shows a regular rate and rhythm. Heart tones are distant. Abdomen is soft. Extremities are without edema. Gait and ROM are intact. No gross neurologic deficits noted.  Wt Readings  from Last 3 Encounters:  11/05/14 196 lb (88.905 kg)  09/27/14 193 lb 9.6 oz (87.816 kg)  09/19/14 194 lb 14.4 oz (88.406 kg)    LABORATORY DATA/PROCEDURES: PENDING  Lab Results  Component Value Date   WBC 11.0* 09/13/2014   HGB 15.6* 09/13/2014   HCT 48.3* 09/13/2014   PLT 237 09/13/2014   GLUCOSE 71 10/15/2014   CHOL 209* 07/04/2014   TRIG 171* 07/04/2014   HDL 48 07/04/2014   LDLCALC 127* 07/04/2014   ALT 30 05/05/2014   AST 105* 05/05/2014   NA 143 10/15/2014   K 4.4 10/15/2014   CL 106 10/15/2014   CREATININE 1.04 10/15/2014   BUN 16 10/15/2014   CO2 30 10/15/2014   INR 0.93 05/09/2014   HGBA1C 5.8* 05/04/2014   Echo Study Conclusions from 07/2014  - Procedure narrative: Transthoracic echocardiography. Image quality was adequate. The study was technically difficult, as a result of poor patient compliance. Intravenous contrast (Definity) was administered. - Left ventricle: The cavity size was normal. Wall thickness was increased in a pattern of moderate LVH. The estimated ejection fraction was 15%. LV apex well visualized with contrast - no thrombus. There is a prominent apical false tendon. Doppler parameters are consistent with  restrictive left ventricular relaxation (grade 3 diastolic dysfunction). The E/e&' ratio is >15, suggesting elevated LV Filling pressure. - Mitral valve: There was moderate regurgitation. - Right ventricle: The cavity size was normal. Systolic function is reduced, particularly at the apex. - Right atrium: Moderately dilated (24 cm2). - Tricuspid valve: There was moderate regurgitation. - Pulmonary arteries: PA peak pressure: 40 mm Hg (S). - Inferior vena cava: The vessel was dilated. The respirophasic diameter changes were blunted (< 50%), consistent with elevated central venous pressure.  Impressions:  - Compared to the prior study on 05/10/14, the EF has fallen from 40% to 15% with severe global hypokinesis. There is moderate MR and TR with elevated left and right-sided filling pressures and restrictive diastolic physiology. Definity contrast did not demonstrate an apical thrombus. BNP (last 3 results)  Recent Labs  08/13/14 1528 08/22/14 1226 09/13/14 1854  PROBNP 6562.0* 3059.0* 12687.0*     Assessment / Plan: 1. ICM - looks to me like her last EF was 15%. She has very poor insight into her condition and treatment plan. Does not wish to increase her medicines. Will get another echo - she needs to see Dr. Swaziland for discussion of options - possible ICD - send back to CHF clinic, etc. She seems noncompliant to me.   2. CAD - with past anterior MI and PCI with repeat PCI due to missed dose of Brilinta. Aspirin allergic.   3. HLD - on lipitor  4. Tobacco abuse - not interested in stopping.   Will get her labs checked today. Update the echo to see where we stand and then get her to Dr. Swaziland for further disposition. Not sure I have much to offer. She has very poor insight into her issues.   Patient is agreeable to this plan and will call if any problems develop in the interim.   Rosalio Macadamia, RN, ANP-C [facility] [address] [place], [place]  [address] [phone]

## 2014-11-06 LAB — BRAIN NATRIURETIC PEPTIDE: Pro B Natriuretic peptide (BNP): 321 pg/mL — ABNORMAL HIGH (ref 0.0–100.0)

## 2014-11-12 ENCOUNTER — Other Ambulatory Visit (HOSPITAL_COMMUNITY): Payer: No Typology Code available for payment source

## 2014-11-13 ENCOUNTER — Ambulatory Visit (HOSPITAL_COMMUNITY): Payer: No Typology Code available for payment source | Attending: Nurse Practitioner | Admitting: Radiology

## 2014-11-13 DIAGNOSIS — I255 Ischemic cardiomyopathy: Secondary | ICD-10-CM

## 2014-11-13 DIAGNOSIS — I10 Essential (primary) hypertension: Secondary | ICD-10-CM | POA: Diagnosis not present

## 2014-11-13 DIAGNOSIS — E785 Hyperlipidemia, unspecified: Secondary | ICD-10-CM | POA: Diagnosis not present

## 2014-11-13 DIAGNOSIS — Z79899 Other long term (current) drug therapy: Secondary | ICD-10-CM

## 2014-11-13 DIAGNOSIS — R0600 Dyspnea, unspecified: Secondary | ICD-10-CM

## 2014-11-13 DIAGNOSIS — Z720 Tobacco use: Secondary | ICD-10-CM | POA: Diagnosis not present

## 2014-11-13 NOTE — Progress Notes (Signed)
Echocardiogram performed.  

## 2014-12-12 ENCOUNTER — Encounter: Payer: Self-pay | Admitting: Cardiology

## 2014-12-12 ENCOUNTER — Ambulatory Visit (INDEPENDENT_AMBULATORY_CARE_PROVIDER_SITE_OTHER): Payer: PRIVATE HEALTH INSURANCE | Admitting: Cardiology

## 2014-12-12 VITALS — BP 130/90 | HR 86 | Ht 65.0 in | Wt 200.1 lb

## 2014-12-12 DIAGNOSIS — I10 Essential (primary) hypertension: Secondary | ICD-10-CM

## 2014-12-12 DIAGNOSIS — I2510 Atherosclerotic heart disease of native coronary artery without angina pectoris: Secondary | ICD-10-CM

## 2014-12-12 DIAGNOSIS — I255 Ischemic cardiomyopathy: Secondary | ICD-10-CM

## 2014-12-12 DIAGNOSIS — I5022 Chronic systolic (congestive) heart failure: Secondary | ICD-10-CM

## 2014-12-12 MED ORDER — ATORVASTATIN CALCIUM 80 MG PO TABS: 40.0000 mg | ORAL_TABLET | Freq: Every day | ORAL | Status: DC

## 2014-12-12 MED ORDER — HYDRALAZINE HCL 25 MG PO TABS: 25.0000 mg | ORAL_TABLET | Freq: Three times a day (TID) | ORAL | Status: DC

## 2014-12-12 MED ORDER — POTASSIUM CHLORIDE CRYS ER 20 MEQ PO TBCR: 20.0000 meq | EXTENDED_RELEASE_TABLET | Freq: Every day | ORAL | Status: DC

## 2014-12-12 MED ORDER — SPIRONOLACTONE 25 MG PO TABS: 25.0000 mg | ORAL_TABLET | Freq: Every day | ORAL | Status: DC

## 2014-12-12 NOTE — Patient Instructions (Signed)
Continue your current therapy  We will refer you back to cardiac Rehab.  I will see you in 3 months with lab work

## 2014-12-12 NOTE — Progress Notes (Signed)
Deborah Ho Date of Birth: 11-06-1951 Medical Record #[id number]  History of Present Illness: Deborah Ho is seen for follow up of CHF and CAD.  She is a 64 y.o. female with a history of HTN, HLD, history of ongoing tobacco abuse, CAD s/p  anterior STEMI with early in-stent thorombosis for missed dose of Brillinta s/p PCI x 2 into LAD (04/2014) and ischemic CM.  Patient initially presented as anterior STEMI on 05/03/2014. Cardiac catheterization that time showed a 95% proximal LAD stenosis, 30% proximal left circumflex stenosis, 20% mild ramus intermedius stenosis, 70-80% long segment RCA stenosis. Patient's ejection fraction was noted to be 35% that time. She was placed on aspirin and Brilinta and eventually discharged on 05/03/2014. Patient came back to the [facility] hospital the following week on 05/09/2014 with a recurrent anterior MI because she missed one day of her Brilinta. Cardiac catheterization done on 6/24 showed subacute stent thrombosis in mid LAD which was treated with overlapping drug-eluting stent at the distal edge of the previous stent. Her LVEDP was noted at 23 mmHg at the time. Her ejection fraction was noted to be 35% on cardiac catheterization.  She has had 2 heart failure admissions. Echo from September showed an EF down to 15%. Review of her chart shows issues with non compliance and very poor insight into her issues. She has been seen by multiple providers in our office and in the heart failure clinic and all note a significant lack of insight into her medical issues.  On follow up today she states she is doing great. She denies any chest pain or SOB. She reports her weight at home has not changed in 3 weeks at 196 lbs. She states she measures her fluid intake and doesn't eat salt but then tells me about a boiled ham that she fixed over the holidays. She is walking still doesn't understand what her medication is for. Missed taking her lipitor because she confused it for a  calcium pill. Reports to CMA that she is not taking aldactone. Still smokes a few cigarettes daily. No palpitations, edema, or orthopnea.  Echo repeated 11/13/14 and showed improved EF to 35-40%.   Current Outpatient Prescriptions  Medication Sig Dispense Refill  . carvedilol (COREG) 6.25 MG tablet Take 6.25 mg by mouth 2 (two) times daily with a meal.    . cetirizine (ZYRTEC) 10 MG tablet Take 10 mg by mouth daily as needed for allergies.    . furosemide (LASIX) 40 MG tablet Take 1 tablet (40 mg total) by mouth 2 (two) times daily. 60 tablet 3  . isosorbide dinitrate (ISORDIL) 20 MG tablet Take 20 mg by mouth daily.    . nitroGLYCERIN (NITROSTAT) 0.4 MG SL tablet Place 0.4 mg under the tongue every 5 (five) minutes as needed for chest pain.    . ticagrelor (BRILINTA) 90 MG TABS tablet Take 1 tablet (90 mg total) by mouth 2 (two) times daily. 60 tablet 11  . atorvastatin (LIPITOR) 80 MG tablet Take 0.5 tablets (40 mg total) by mouth daily at 6 PM. 30 tablet 6  . hydrALAZINE (APRESOLINE) 25 MG tablet Take 1 tablet (25 mg total) by mouth every 8 (eight) hours. 90 tablet 6  . potassium chloride SA (K-DUR,KLOR-CON) 20 MEQ tablet Take 1 tablet (20 mEq total) by mouth daily. 30 tablet 6  . spironolactone (ALDACTONE) 25 MG tablet Take 1 tablet (25 mg total) by mouth daily. 30 tablet 6   No current facility-administered medications for this visit.  Allergies  Allergen Reactions  . Aspirin Swelling    Chewable children's aspirin makes patients tongue and face swell  . Effient [Prasugrel] Swelling    Patient's tongue and face swells  . Lactose Intolerance (Gi) Other (See Comments)    REACTION: stomach upset  . Robitussin Dm [Guaifenesin-Dm] Swelling    Patient's tongue swells  . Sulfa Antibiotics Swelling  . Wheat Bran Other (See Comments)    REACTION: unknown    Past Medical History  Diagnosis Date  . GERD (gastroesophageal reflux disease)     Probable  . CAD (coronary artery disease)  05/03/14; 05/09/14    a. anterior STEMI with early in-stent thorombosis for missed dose of Brillinta s/p PCI with DESx 2 into LAD (04/2014)  . HTN (hypertension)   . Hyperlipidemia   . Tobacco use   . Non compliance w medication regimen   . Ischemic cardiomyopathy     a. 04/2014 ECHO with EF 45-50% b.  Repeat 2D echo 08/14/14 with EF down at 15%. Life vest placed    Past Surgical History  Procedure Laterality Date  . Partial hysterectomy    . Cholecystectomy    . Coronary angioplasty with stent placement  05/03/14    STEMI- stent to LAD DES- Xience alpine  . Coronary angioplasty with stent placement  05/09/14    STEMI- overlapping stent to LAD, pt had missed a dose of Brilinta  . Left heart catheterization with coronary angiogram N/A 05/03/2014    Procedure: LEFT HEART CATHETERIZATION WITH CORONARY ANGIOGRAM;  Surgeon: Peter M Swaziland, MD;  Location: [facility] CATH LAB;  Service: Cardiovascular;  Laterality: N/A;  . Percutaneous stent intervention  05/03/2014    Procedure: PERCUTANEOUS STENT INTERVENTION;  Surgeon: Peter M Swaziland, MD;  Location: [facility] CATH LAB;  Service: Cardiovascular;;  DES Prox LAD   . Left heart catheterization with coronary angiogram N/A 05/09/2014    Procedure: LEFT HEART CATHETERIZATION WITH CORONARY ANGIOGRAM;  Surgeon: Corky Crafts, MD;  Location: [facility] CATH LAB;  Service: Cardiovascular;  Laterality: N/A;    History  Smoking status  . Current Every Day Smoker -- 0.10 packs/day for .5 years  . Types: Cigarettes  Smokeless tobacco  . Current User    Comment: down to 3 cigarettes daily (08/03/14)    History  Alcohol Use No    Family History  Problem Relation Age of Onset  . Diabetes Mother   . Hypertension Mother     Review of Systems: The review of systems is per the HPI.  All other systems were reviewed and are negative.  Physical Exam: BP 130/90 mmHg  Pulse 86  Ht 5\' 5"  (1.651 m)  Wt 200 lb 2 oz (90.776 kg)  BMI 33.30 kg/m2 Patient is alert and in no  acute distress. Very upbeat today and pleasant. Weight is up 4 pounds. Skin is warm and dry. Color is normal.  HEENT is unremarkable. Normocephalic/atraumatic. PERRL. Sclera are nonicteric. Neck is supple. No masses. No JVD. Lungs are clear. Cardiac exam shows a regular rate and rhythm. Heart tones are distant. Abdomen is soft. Extremities are without edema. Gait and ROM are intact. No gross neurologic deficits noted.  Wt Readings from Last 3 Encounters:  12/12/14 200 lb 2 oz (90.776 kg)  11/05/14 196 lb (88.905 kg)  09/27/14 193 lb 9.6 oz (87.816 kg)    LABORATORY DATA/PROCEDURES: PENDING  Lab Results  Component Value Date   WBC 11.6* 11/05/2014   HGB 14.4 11/05/2014   HCT 44.7 11/05/2014  PLT 323.0 11/05/2014   GLUCOSE 81 11/05/2014   CHOL 209* 07/04/2014   TRIG 171* 07/04/2014   HDL 48 07/04/2014   LDLCALC 127* 07/04/2014   ALT 10 11/05/2014   AST 16 11/05/2014   NA 140 11/05/2014   K 3.3* 11/05/2014   CL 101 11/05/2014   CREATININE 1.0 11/05/2014   BUN 16 11/05/2014   CO2 29 11/05/2014   INR 0.93 05/09/2014   HGBA1C 5.8* 05/04/2014   Echo Study 11/13/14: Study Conclusions  - Left ventricle: The cavity size was normal. Wall thickness was increased in a pattern of mild LVH. There was focal basal hypertrophy. Systolic function was moderately reduced. The estimated ejection fraction was in the range of 35% to 40%. Severe hypokinesis of the mid-apicalanteroseptal myocardium. Features are consistent with a pseudonormal left ventricular filling pattern, with concomitant abnormal relaxation and increased filling pressure (grade 2 diastolic dysfunction). - Left atrium: The atrium was mildly dilated. - Pulmonary arteries: Systolic pressure was mildly increased. PA peak pressure: 45 mm Hg (S).  BNP (last 3 results)  Recent Labs  08/22/14 1226 09/13/14 1854 11/05/14 1143  PROBNP 3059.0* 12687.0* 321.0*     Assessment / Plan: 1. ICM - EF improved  from 15% to 35-40%. She still has very poor insight into her condition and treatment plan. Stressed the importance of taking her medication as prescribed-this is why she is better! No indication for ICD with improvement in EF.  Compliance is going to be an ongoing issue. She had stopped working because she had trouble taking her mediation and working. I told her she could only return to work if it does not interfere with her medical treatment. She is concerned about how much exercise she can do. I have recommended Cardiac Rehab since she did not do this post MI. She is very interested and I hope she will follow thru.  2. CAD - with past anterior MI and PCI with repeat PCI due to missed dose of Brilinta. Aspirin allergic. Will probably reduce Brilinta to 60 mg bid at one year.   3. HLD - on lipitor. Lipids not checked since August- will check next visit in 3 months  4. Tobacco abuse -recommend complete cessation.

## 2014-12-25 ENCOUNTER — Telehealth (HOSPITAL_COMMUNITY): Payer: Self-pay | Admitting: Cardiac Rehabilitation

## 2014-12-25 NOTE — Telephone Encounter (Signed)
pc to pt to enroll in cardiac rehab.  Unfortunately pt has high insurance [phone] which $17.90 has been met.  After deductible, cardiac rehab covered 100%.  Pt interested in cardiac maintenance program.  Maintenance information mailed to patient.

## 2015-01-17 ENCOUNTER — Telehealth: Payer: Self-pay | Admitting: Cardiology

## 2015-01-17 DIAGNOSIS — I2109 ST elevation (STEMI) myocardial infarction involving other coronary artery of anterior wall: Secondary | ICD-10-CM

## 2015-01-17 DIAGNOSIS — I5023 Acute on chronic systolic (congestive) heart failure: Secondary | ICD-10-CM

## 2015-01-17 DIAGNOSIS — I2510 Atherosclerotic heart disease of native coronary artery without angina pectoris: Secondary | ICD-10-CM

## 2015-01-17 NOTE — Telephone Encounter (Signed)
Cramping right side-started yesterday as well on left side. Not occurring at present, she was concerned.  no chest pain , no nausea  She states she has some swelling in ankles not leg, no bloating,no indentation noted in hands or feet per patient. Patient states she has not missed a dose of furosemide. RN recommend primary. Patient states she hs never meet Primary. RN suggest urgent care if needed. Will inform Dr Swaziland - to see if anything as is needed

## 2015-01-17 NOTE — Telephone Encounter (Signed)
Please call need to share some important information .

## 2015-01-17 NOTE — Telephone Encounter (Signed)
If she notices increased swelling or weight gain she can take an extra lasix. I do not know why she coughs.  Deborah Broers Sw[place] MD, [facility]

## 2015-01-17 NOTE — Telephone Encounter (Signed)
Also wanted To know  Every 4 hours- cough so much heave at times ? What is coughing coming from. She is out of the medication given to in the past

## 2015-01-17 NOTE — Telephone Encounter (Signed)
Pt c/o swelling: STAT is pt has developed SOB within 24 hours  1. How long have you been experiencing swelling? Since last thursday  2. Where is the swelling located? Hands and feet  3.  Are you currently taking a "fluid pill"? Yes furosemide bid   4.  Are you currently SOB? no  5.  Have you traveled recently?minimal driving (15 mins in the car)

## 2015-01-18 NOTE — Telephone Encounter (Signed)
Returned call to patient 01/17/15 Dr.Jordan advised can take a extra lasix for increased swelling or weight gain.Advised to see PCP about her coughing.Stated she does not have a PCP.Advised schedulers will call back with a PCP appointment.

## 2015-01-18 NOTE — Addendum Note (Signed)
Addended by: Meda Klinefelter D on: 01/18/2015 02:00 PM   Modules accepted: Orders

## 2015-01-28 ENCOUNTER — Encounter (HOSPITAL_COMMUNITY): Payer: Self-pay | Admitting: Emergency Medicine

## 2015-01-28 DIAGNOSIS — I252 Old myocardial infarction: Secondary | ICD-10-CM

## 2015-01-28 DIAGNOSIS — Z8249 Family history of ischemic heart disease and other diseases of the circulatory system: Secondary | ICD-10-CM

## 2015-01-28 DIAGNOSIS — E162 Hypoglycemia, unspecified: Secondary | ICD-10-CM | POA: Diagnosis present

## 2015-01-28 DIAGNOSIS — M109 Gout, unspecified: Secondary | ICD-10-CM | POA: Diagnosis present

## 2015-01-28 DIAGNOSIS — E876 Hypokalemia: Secondary | ICD-10-CM | POA: Diagnosis present

## 2015-01-28 DIAGNOSIS — E785 Hyperlipidemia, unspecified: Secondary | ICD-10-CM | POA: Diagnosis present

## 2015-01-28 DIAGNOSIS — R0602 Shortness of breath: Secondary | ICD-10-CM | POA: Diagnosis not present

## 2015-01-28 DIAGNOSIS — I255 Ischemic cardiomyopathy: Secondary | ICD-10-CM | POA: Diagnosis present

## 2015-01-28 DIAGNOSIS — J189 Pneumonia, unspecified organism: Secondary | ICD-10-CM | POA: Diagnosis present

## 2015-01-28 DIAGNOSIS — N390 Urinary tract infection, site not specified: Secondary | ICD-10-CM | POA: Diagnosis present

## 2015-01-28 DIAGNOSIS — K219 Gastro-esophageal reflux disease without esophagitis: Secondary | ICD-10-CM | POA: Diagnosis present

## 2015-01-28 DIAGNOSIS — J90 Pleural effusion, not elsewhere classified: Secondary | ICD-10-CM | POA: Diagnosis present

## 2015-01-28 DIAGNOSIS — F17210 Nicotine dependence, cigarettes, uncomplicated: Secondary | ICD-10-CM | POA: Diagnosis present

## 2015-01-28 DIAGNOSIS — N179 Acute kidney failure, unspecified: Secondary | ICD-10-CM | POA: Diagnosis present

## 2015-01-28 DIAGNOSIS — J9600 Acute respiratory failure, unspecified whether with hypoxia or hypercapnia: Secondary | ICD-10-CM | POA: Diagnosis present

## 2015-01-28 DIAGNOSIS — Z9111 Patient's noncompliance with dietary regimen: Secondary | ICD-10-CM | POA: Diagnosis present

## 2015-01-28 DIAGNOSIS — I5023 Acute on chronic systolic (congestive) heart failure: Principal | ICD-10-CM | POA: Diagnosis present

## 2015-01-28 DIAGNOSIS — Z833 Family history of diabetes mellitus: Secondary | ICD-10-CM

## 2015-01-28 DIAGNOSIS — R112 Nausea with vomiting, unspecified: Secondary | ICD-10-CM | POA: Diagnosis present

## 2015-01-28 DIAGNOSIS — I10 Essential (primary) hypertension: Secondary | ICD-10-CM | POA: Diagnosis present

## 2015-01-28 DIAGNOSIS — B379 Candidiasis, unspecified: Secondary | ICD-10-CM | POA: Diagnosis present

## 2015-01-28 DIAGNOSIS — B9620 Unspecified Escherichia coli [E. coli] as the cause of diseases classified elsewhere: Secondary | ICD-10-CM | POA: Diagnosis present

## 2015-01-28 DIAGNOSIS — I2510 Atherosclerotic heart disease of native coronary artery without angina pectoris: Secondary | ICD-10-CM | POA: Diagnosis present

## 2015-01-28 LAB — COMPREHENSIVE METABOLIC PANEL
ALT: 23 U/L (ref 0–35)
AST: 27 U/L (ref 0–37)
Albumin: 3.1 g/dL — ABNORMAL LOW (ref 3.5–5.2)
Alkaline Phosphatase: 150 U/L — ABNORMAL HIGH (ref 39–117)
Anion gap: 12 (ref 5–15)
BUN: 13 mg/dL (ref 6–23)
CO2: 25 mmol/L (ref 19–32)
Calcium: 9.1 mg/dL (ref 8.4–10.5)
Chloride: 108 mmol/L (ref 96–112)
Creatinine, Ser: 1.16 mg/dL — ABNORMAL HIGH (ref 0.50–1.10)
GFR calc Af Amer: 57 mL/min — ABNORMAL LOW (ref >90)
GFR calc non Af Amer: 49 mL/min — ABNORMAL LOW (ref >90)
Glucose, Bld: 123 mg/dL — ABNORMAL HIGH (ref 70–99)
Potassium: 3.4 mmol/L — ABNORMAL LOW (ref 3.5–5.1)
Sodium: 145 mmol/L (ref 135–145)
Total Bilirubin: 1.1 mg/dL (ref 0.3–1.2)
Total Protein: 6.6 g/dL (ref 6.0–8.3)

## 2015-01-28 LAB — CBC WITH DIFFERENTIAL/PLATELET
Basophils Absolute: 0.1 10*3/uL (ref 0.0–0.1)
Basophils Relative: 1 % (ref 0–1)
Eosinophils Absolute: 0.2 10*3/uL (ref 0.0–0.7)
Eosinophils Relative: 2 % (ref 0–5)
HCT: 45.8 % (ref 36.0–46.0)
Hemoglobin: 14.7 g/dL (ref 12.0–15.0)
Lymphocytes Relative: 22 % (ref 12–46)
Lymphs Abs: 2.3 10*3/uL (ref 0.7–4.0)
MCH: 28.9 pg (ref 26.0–34.0)
MCHC: 32.1 g/dL (ref 30.0–36.0)
MCV: 90 fL (ref 78.0–100.0)
Monocytes Absolute: 0.7 10*3/uL (ref 0.1–1.0)
Monocytes Relative: 7 % (ref 3–12)
Neutro Abs: 7.1 10*3/uL (ref 1.7–7.7)
Neutrophils Relative %: 68 % (ref 43–77)
Platelets: 411 10*3/uL — ABNORMAL HIGH (ref 150–400)
RBC: 5.09 MIL/uL (ref 3.87–5.11)
RDW: 13.4 % (ref 11.5–15.5)
WBC: 10.3 10*3/uL (ref 4.0–10.5)

## 2015-01-28 LAB — LIPASE, BLOOD: Lipase: 26 U/L (ref 11–59)

## 2015-01-28 NOTE — ED Notes (Addendum)
Pt reports cough, vomiting, diarrhea x 2 weeks. sts her weight has been up now. Denies cp, sob. Able to tolerate crackers and gingerale.

## 2015-01-29 ENCOUNTER — Emergency Department (HOSPITAL_COMMUNITY): Payer: No Typology Code available for payment source

## 2015-01-29 ENCOUNTER — Inpatient Hospital Stay (HOSPITAL_COMMUNITY): Payer: No Typology Code available for payment source

## 2015-01-29 ENCOUNTER — Other Ambulatory Visit (HOSPITAL_COMMUNITY): Payer: Self-pay

## 2015-01-29 ENCOUNTER — Inpatient Hospital Stay (HOSPITAL_COMMUNITY)
Admission: EM | Admit: 2015-01-29 | Discharge: 2015-02-05 | DRG: 291 | Disposition: A | Discharge status: Home / Self Care | Payer: No Typology Code available for payment source | Attending: Internal Medicine | Admitting: Internal Medicine

## 2015-01-29 DIAGNOSIS — I5021 Acute systolic (congestive) heart failure: Secondary | ICD-10-CM | POA: Diagnosis present

## 2015-01-29 DIAGNOSIS — J189 Pneumonia, unspecified organism: Secondary | ICD-10-CM | POA: Diagnosis not present

## 2015-01-29 DIAGNOSIS — N179 Acute kidney failure, unspecified: Secondary | ICD-10-CM | POA: Diagnosis present

## 2015-01-29 DIAGNOSIS — J918 Pleural effusion in other conditions classified elsewhere: Secondary | ICD-10-CM

## 2015-01-29 DIAGNOSIS — J9600 Acute respiratory failure, unspecified whether with hypoxia or hypercapnia: Secondary | ICD-10-CM | POA: Diagnosis present

## 2015-01-29 DIAGNOSIS — I5023 Acute on chronic systolic (congestive) heart failure: Secondary | ICD-10-CM | POA: Diagnosis present

## 2015-01-29 DIAGNOSIS — N390 Urinary tract infection, site not specified: Secondary | ICD-10-CM | POA: Diagnosis present

## 2015-01-29 DIAGNOSIS — I255 Ischemic cardiomyopathy: Secondary | ICD-10-CM | POA: Diagnosis present

## 2015-01-29 DIAGNOSIS — I252 Old myocardial infarction: Secondary | ICD-10-CM | POA: Diagnosis not present

## 2015-01-29 DIAGNOSIS — R1110 Vomiting, unspecified: Secondary | ICD-10-CM

## 2015-01-29 DIAGNOSIS — M109 Gout, unspecified: Secondary | ICD-10-CM | POA: Diagnosis present

## 2015-01-29 DIAGNOSIS — E785 Hyperlipidemia, unspecified: Secondary | ICD-10-CM | POA: Diagnosis present

## 2015-01-29 DIAGNOSIS — I2510 Atherosclerotic heart disease of native coronary artery without angina pectoris: Secondary | ICD-10-CM | POA: Diagnosis present

## 2015-01-29 DIAGNOSIS — R0602 Shortness of breath: Secondary | ICD-10-CM | POA: Diagnosis present

## 2015-01-29 DIAGNOSIS — E162 Hypoglycemia, unspecified: Secondary | ICD-10-CM | POA: Diagnosis present

## 2015-01-29 DIAGNOSIS — R112 Nausea with vomiting, unspecified: Secondary | ICD-10-CM

## 2015-01-29 DIAGNOSIS — K219 Gastro-esophageal reflux disease without esophagitis: Secondary | ICD-10-CM | POA: Diagnosis present

## 2015-01-29 DIAGNOSIS — I10 Essential (primary) hypertension: Secondary | ICD-10-CM | POA: Diagnosis present

## 2015-01-29 DIAGNOSIS — R600 Localized edema: Secondary | ICD-10-CM | POA: Insufficient documentation

## 2015-01-29 DIAGNOSIS — B379 Candidiasis, unspecified: Secondary | ICD-10-CM | POA: Diagnosis present

## 2015-01-29 DIAGNOSIS — N3000 Acute cystitis without hematuria: Secondary | ICD-10-CM

## 2015-01-29 DIAGNOSIS — E876 Hypokalemia: Secondary | ICD-10-CM | POA: Diagnosis present

## 2015-01-29 DIAGNOSIS — Z720 Tobacco use: Secondary | ICD-10-CM | POA: Diagnosis present

## 2015-01-29 DIAGNOSIS — Z9111 Patient's noncompliance with dietary regimen: Secondary | ICD-10-CM | POA: Diagnosis present

## 2015-01-29 DIAGNOSIS — F17210 Nicotine dependence, cigarettes, uncomplicated: Secondary | ICD-10-CM | POA: Diagnosis present

## 2015-01-29 DIAGNOSIS — J90 Pleural effusion, not elsewhere classified: Secondary | ICD-10-CM | POA: Insufficient documentation

## 2015-01-29 DIAGNOSIS — R197 Diarrhea, unspecified: Secondary | ICD-10-CM

## 2015-01-29 DIAGNOSIS — Z833 Family history of diabetes mellitus: Secondary | ICD-10-CM | POA: Diagnosis not present

## 2015-01-29 DIAGNOSIS — B9620 Unspecified Escherichia coli [E. coli] as the cause of diseases classified elsewhere: Secondary | ICD-10-CM | POA: Diagnosis present

## 2015-01-29 DIAGNOSIS — Z8249 Family history of ischemic heart disease and other diseases of the circulatory system: Secondary | ICD-10-CM | POA: Diagnosis not present

## 2015-01-29 LAB — BODY FLUID CELL COUNT WITH DIFFERENTIAL
Eos, Fluid: 0 %
Lymphs, Fluid: 95 %
Monocyte-Macrophage-Serous Fluid: 2 % — ABNORMAL LOW (ref 50–90)
Neutrophil Count, Fluid: 3 % (ref 0–25)
Total Nucleated Cell Count, Fluid: 370 cu mm (ref 0–1000)

## 2015-01-29 LAB — URINALYSIS, ROUTINE W REFLEX MICROSCOPIC
Bilirubin Urine: NEGATIVE
Glucose, UA: NEGATIVE mg/dL
Ketones, ur: NEGATIVE mg/dL
Nitrite: POSITIVE — AB
Protein, ur: 30 mg/dL — AB
Specific Gravity, Urine: 1.019 (ref 1.005–1.030)
Urobilinogen, UA: 2 mg/dL — ABNORMAL HIGH (ref 0.0–1.0)
pH: 6 (ref 5.0–8.0)

## 2015-01-29 LAB — PROTEIN, BODY FLUID: Total protein, fluid: <3 g/dL

## 2015-01-29 LAB — URINE MICROSCOPIC-ADD ON

## 2015-01-29 LAB — LACTATE DEHYDROGENASE, PLEURAL OR PERITONEAL FLUID: LD, Fluid: 48 U/L — ABNORMAL HIGH (ref 3–23)

## 2015-01-29 LAB — STREP PNEUMONIAE URINARY ANTIGEN: Strep Pneumo Urinary Antigen: NEGATIVE

## 2015-01-29 MED ORDER — SODIUM CHLORIDE 0.9 % IV SOLN: 250.0000 mL | INTRAVENOUS | Status: DC | PRN

## 2015-01-29 MED ORDER — LORATADINE 10 MG PO TABS
10.0000 mg | ORAL_TABLET | Freq: Every day | ORAL | Status: DC
  Administered 2015-01-29 – 2015-02-05 (×6): 10 mg via ORAL
  Filled 2015-01-29 (×8): qty 1

## 2015-01-29 MED ORDER — FUROSEMIDE 10 MG/ML IJ SOLN
40.0000 mg | Freq: Once | INTRAMUSCULAR | Status: DC
Start: 2015-01-29 — End: 2015-01-29

## 2015-01-29 MED ORDER — BENZONATATE 100 MG PO CAPS
200.0000 mg | ORAL_CAPSULE | Freq: Three times a day (TID) | ORAL | Status: DC
  Administered 2015-01-29 – 2015-02-04 (×16): 200 mg via ORAL
  Filled 2015-01-29 (×23): qty 2

## 2015-01-29 MED ORDER — DEXTROSE 5 % IV SOLN
1.0000 g | INTRAVENOUS | Status: DC
  Filled 2015-01-29 (×3): qty 10

## 2015-01-29 MED ORDER — CARVEDILOL 6.25 MG PO TABS
6.2500 mg | ORAL_TABLET | Freq: Two times a day (BID) | ORAL | Status: DC
  Administered 2015-01-29 – 2015-02-05 (×14): 6.25 mg via ORAL
  Filled 2015-01-29 (×17): qty 1

## 2015-01-29 MED ORDER — LOSARTAN POTASSIUM 25 MG PO TABS
25.0000 mg | ORAL_TABLET | Freq: Every day | ORAL | Status: DC
  Administered 2015-01-29 – 2015-02-02 (×5): 25 mg via ORAL
  Filled 2015-01-29 (×5): qty 1

## 2015-01-29 MED ORDER — FUROSEMIDE 10 MG/ML IJ SOLN
80.0000 mg | Freq: Two times a day (BID) | INTRAMUSCULAR | Status: DC
  Administered 2015-01-29 – 2015-01-31 (×5): 80 mg via INTRAVENOUS
  Filled 2015-01-29 (×8): qty 8

## 2015-01-29 MED ORDER — AZITHROMYCIN 500 MG PO TABS
500.0000 mg | ORAL_TABLET | ORAL | Status: DC
  Administered 2015-01-30: 500 mg via ORAL
  Filled 2015-01-29 (×2): qty 1

## 2015-01-29 MED ORDER — AZITHROMYCIN 250 MG PO TABS: 500.0000 mg | ORAL_TABLET | ORAL | Status: DC

## 2015-01-29 MED ORDER — ATORVASTATIN CALCIUM 40 MG PO TABS
40.0000 mg | ORAL_TABLET | Freq: Every day | ORAL | Status: DC
  Administered 2015-01-29 – 2015-02-04 (×7): 40 mg via ORAL
  Filled 2015-01-29 (×8): qty 1

## 2015-01-29 MED ORDER — ALBUTEROL SULFATE (2.5 MG/3ML) 0.083% IN NEBU: 2.5000 mg | INHALATION_SOLUTION | RESPIRATORY_TRACT | Status: DC | PRN

## 2015-01-29 MED ORDER — LIDOCAINE HCL (PF) 1 % IJ SOLN
INTRAMUSCULAR | Status: AC
  Filled 2015-01-29: qty 10

## 2015-01-29 MED ORDER — LEVOFLOXACIN IN D5W 750 MG/150ML IV SOLN
750.0000 mg | Freq: Once | INTRAVENOUS | Status: AC
  Administered 2015-01-29: 750 mg via INTRAVENOUS
  Filled 2015-01-29: qty 150

## 2015-01-29 MED ORDER — FUROSEMIDE 20 MG PO TABS
40.0000 mg | ORAL_TABLET | Freq: Once | ORAL | Status: AC
  Administered 2015-01-29: 40 mg via ORAL
  Filled 2015-01-29: qty 2

## 2015-01-29 MED ORDER — CEFTRIAXONE SODIUM 1 G IJ SOLR
1.0000 g | INTRAMUSCULAR | Status: DC
  Filled 2015-01-29: qty 10

## 2015-01-29 MED ORDER — SPIRONOLACTONE 25 MG PO TABS
25.0000 mg | ORAL_TABLET | Freq: Every day | ORAL | Status: DC
  Administered 2015-01-29 – 2015-02-03 (×6): 25 mg via ORAL
  Filled 2015-01-29 (×7): qty 1

## 2015-01-29 MED ORDER — NITROGLYCERIN 0.4 MG SL SUBL: 0.4000 mg | SUBLINGUAL_TABLET | SUBLINGUAL | Status: DC | PRN

## 2015-01-29 MED ORDER — SODIUM CHLORIDE 0.9 % IJ SOLN: 3.0000 mL | INTRAMUSCULAR | Status: DC | PRN

## 2015-01-29 MED ORDER — TICAGRELOR 90 MG PO TABS
90.0000 mg | ORAL_TABLET | Freq: Two times a day (BID) | ORAL | Status: DC
  Administered 2015-01-29 – 2015-02-05 (×15): 90 mg via ORAL
  Filled 2015-01-29 (×16): qty 1

## 2015-01-29 MED ORDER — ACETAMINOPHEN 325 MG PO TABS
650.0000 mg | ORAL_TABLET | ORAL | Status: DC | PRN
  Administered 2015-01-30 – 2015-02-02 (×3): 650 mg via ORAL
  Filled 2015-01-29 (×3): qty 2

## 2015-01-29 MED ORDER — ISOSORB DINITRATE-HYDRALAZINE 20-37.5 MG PO TABS
1.0000 | ORAL_TABLET | Freq: Two times a day (BID) | ORAL | Status: DC
  Administered 2015-01-29 – 2015-02-02 (×9): 1 via ORAL
  Filled 2015-01-29 (×10): qty 1

## 2015-01-29 MED ORDER — ENOXAPARIN SODIUM 40 MG/0.4ML ~~LOC~~ SOLN
40.0000 mg | SUBCUTANEOUS | Status: DC
  Administered 2015-01-29 – 2015-02-04 (×7): 40 mg via SUBCUTANEOUS
  Filled 2015-01-29 (×8): qty 0.4

## 2015-01-29 MED ORDER — POTASSIUM CHLORIDE CRYS ER 20 MEQ PO TBCR
40.0000 meq | EXTENDED_RELEASE_TABLET | Freq: Every day | ORAL | Status: DC
  Administered 2015-01-29 – 2015-02-01 (×4): 40 meq via ORAL
  Filled 2015-01-29 (×4): qty 2

## 2015-01-29 MED ORDER — SODIUM CHLORIDE 0.9 % IJ SOLN
3.0000 mL | Freq: Two times a day (BID) | INTRAMUSCULAR | Status: DC
  Administered 2015-01-29 – 2015-02-05 (×13): 3 mL via INTRAVENOUS

## 2015-01-29 MED ORDER — MORPHINE SULFATE 4 MG/ML IJ SOLN
4.0000 mg | Freq: Once | INTRAMUSCULAR | Status: AC
  Administered 2015-01-29: 4 mg via INTRAVENOUS
  Filled 2015-01-29: qty 1

## 2015-01-29 MED ORDER — ONDANSETRON HCL 4 MG/2ML IJ SOLN
4.0000 mg | Freq: Four times a day (QID) | INTRAMUSCULAR | Status: DC | PRN
Start: 2015-01-29 — End: 2015-02-05

## 2015-01-29 NOTE — H&P (Addendum)
[facility] History and Physical  Deborah Ho [name]:[id number] DOB: 1950-12-13 DOA: 01/29/2015  Referring physician: [name] PCP: [name], PA-C   Primary cardiologist : [name]  Dry weight : 194 lbs  weight on admission : 206 lbs  Chief Complaint: Cough with SOB x 3 weeks   HPI:  64 year old female with history of hypertension, hyperlipidemia, ongoing tobacco use, CAD status post anterior STEMI with early in-stent thrombosis after missed dose of Brilinta s/p PCI at 2 into LAD in 04/2014, ischemic cardiomyopathy with EF of 35 -40% multiple hospitalization for CHF exacerbation presented to the ED with cough with shortness of breath for the past 3 weeks. Patient reports cough with thick whitish phlegm associated with increased fatigue and dyspnea on exertion . Patient reports that she has difficulty getting out of her routine activities. She has also noticed increased leg swelling for the past 2 weeks. Patient had called her cardiologist office and was instructed to take extra dose of Lasix which did not help her symptoms. She reports that she has been compliant with her diet, sodium restriction and medication. She denies any recent illness or sick contacts. She also reports having several episodes of vomiting mainly of watery liquid. Denies abdominal pain or diarrhea. Reports subjective chills. Patient denies headache, dizziness, fever,chest pain, palpitations,  abdominal pain, bowel or urinary symptoms.  Denies checking her weight at home. Denies loss of appetite.  Course in the ED Patient's vitals were stable. Clinically exam was suggestive of CHF. Chest x-rays showed moderate right pleural effusion with right basilar airspace opacity concerning for pneumonia versus asymmetric pulmonary edema. Also showed mild basilar opacity as well. CBC was normal. Chemistry showed potassium 3.4, creatinine 1.16. BNP level pending. Patient given one dose of IV heparin and 40 mg  IV Lasix and hospitalists admission requested telemetry.   Review of Systems:  Constitutional: Chills, fatigue, Denies fever, chills, diaphoresis, appetite change HEENT: Congestion, Denies visual or hearing symptoms, difficulty swallowing, neck pain  Respiratory:  SOB, DOE, cough, denies chest tightness,  and wheezing.   Cardiovascular: Denies chest pain, palpitations,  leg swelling++.  Gastrointestinal:  nausea, vomiting+, denies abdominal pain, diarrhea, constipation, blood in stool and abdominal distention.  Genitourinary: Denies dysuria, urgency, frequency, hematuria, flank pain and difficulty urinating.  Endocrine: Denies: hot or cold intolerance,  polyuria, polydipsia. Musculoskeletal: Denies myalgias, back pain, joint pain or swelling Skin: Denies  rash and wound.  Neurological: Denies dizziness, , syncope, weakness, light-headedness, numbness and headaches.  Hematological: Denies adenopathy.  Psychiatric/Behavioral: Denies confusion  Past Medical History  Diagnosis Date  . GERD (gastroesophageal reflux disease)     Probable  . CAD (coronary artery disease) 05/03/14; 05/09/14    a. anterior STEMI with early in-stent thorombosis for missed dose of Brillinta s/p PCI with DESx 2 into LAD (04/2014)  . HTN (hypertension)   . Hyperlipidemia   . Tobacco use   . Non compliance w medication regimen   . Ischemic cardiomyopathy     a. 04/2014 ECHO with EF 45-50% b.  Repeat 2D echo 08/14/14 with EF down at 15%. Life vest placed   Past Surgical History  Procedure Laterality Date  . Partial hysterectomy    . Cholecystectomy    . Coronary angioplasty with stent placement  05/03/14    STEMI- stent to LAD DES- Xience alpine  . Coronary angioplasty with stent placement  05/09/14    STEMI- overlapping stent to LAD, pt had missed a dose of Brilinta  . Left  heart catheterization with coronary angiogram N/A 05/03/2014    Procedure: LEFT HEART CATHETERIZATION WITH CORONARY ANGIOGRAM;  Surgeon: Peter M  Swaziland, MD;  Location: [facility] CATH LAB;  Service: Cardiovascular;  Laterality: N/A;  . Percutaneous stent intervention  05/03/2014    Procedure: PERCUTANEOUS STENT INTERVENTION;  Surgeon: Peter M Swaziland, MD;  Location: [facility] CATH LAB;  Service: Cardiovascular;;  DES Prox LAD   . Left heart catheterization with coronary angiogram N/A 05/09/2014    Procedure: LEFT HEART CATHETERIZATION WITH CORONARY ANGIOGRAM;  Surgeon: Corky Crafts, MD;  Location: [facility] CATH LAB;  Service: Cardiovascular;  Laterality: N/A;   Social History:  reports that she has been smoking Cigarettes.  She has a .05 pack-year smoking history. She uses smokeless tobacco. She reports that she does not drink alcohol or use illicit drugs.  Allergies  Allergen Reactions  . Aspirin Swelling    Chewable children's aspirin makes patients tongue and face swell  . Effient [Prasugrel] Swelling    Patient's tongue and face swells  . Lactose Intolerance (Gi) Other (See Comments)    REACTION: stomach upset  . Robitussin Dm [Guaifenesin-Dm] Swelling    Patient's tongue swells  . Sulfa Antibiotics Swelling  . Wheat Bran Other (See Comments)    REACTION: unknown    Family History  Problem Relation Age of Onset  . Diabetes Mother   . Hypertension Mother     Prior to Admission medications   Medication Sig Start Date End Date Taking? Authorizing Provider  atorvastatin (LIPITOR) 80 MG tablet Take 0.5 tablets (40 mg total) by mouth daily at 6 PM. Patient taking differently: Take 40 mg by mouth every 7 (seven) days. Monday 12/12/14  Yes Peter M Sw[place]il[place], MD  cetirizine (ZYRTEC) 10 MG tablet Take 10 mg by mouth daily as needed for allergies.   Yes Historical Provider, MD  furosemide (LASIX) 40 MG tablet Take 1 tablet (40 mg total) by mouth 2 (two) times daily. 08/22/14  Yes Aundria Rud, NP  hydrALAZINE (APRESOLINE) 25 MG tablet Take 1 tablet (25 mg total) by mouth every 8 (eight) hours. 12/12/14  Yes Peter M [place], MD  nitroGLYCERIN  (NITROSTAT) 0.4 MG SL tablet Place 0.4 mg under the tongue every 5 (five) minutes as needed for chest pain.   Yes Historical Provider, MD  spironolactone (ALDACTONE) 25 MG tablet Take 1 tablet (25 mg total) by mouth daily. 12/12/14  Yes Peter M [place], MD  ticagrelor (BRILINTA) 90 MG TABS tablet Take 1 tablet (90 mg total) by mouth 2 (two) times daily. 11/05/14  Yes Rosalio Macadamia, NP  potassium chloride SA (K-DUR,KLOR-CON) 20 MEQ tablet Take 1 tablet (20 mEq total) by mouth daily. Patient not taking: Reported on 01/28/2015 12/12/14   Peter M Swaziland, MD     Physical Exam:  Filed Vitals:   01/29/15 0331 01/29/15 0345 01/29/15 0600 01/29/15 0738  BP: 168/111  163/90 156/91  Pulse: 101 100 97 99  Temp:      TempSrc:      Resp: 22   20  Height:      Weight:      SpO2: 97% 96% 93% 97%    Constitutional: Vital signs reviewed.  Elderly overweight female lying in bed in no acute distress HEENT: no pallor, no icterus, moist oral mucosa, no cervical lymphadenopathy, no JVD Cardiovascular: RRR, S1 normal, S2 normal, no MRG Chest: Basilar crackles more pronounced over the right lung base with diminished breath sounds, no rhonchi or wheeze Abdominal: Soft.  Non-tender, non-distended, bowel sounds are normal,  Ext: warm, 2+ pitting edema up to upper tibia  Neurological: Alert and oriented, nonfocal  Labs on Admission:  Basic Metabolic Panel:  Recent Labs Lab 01/28/15 2031  NA 145  K 3.4*  CL 108  CO2 25  GLUCOSE 123*  BUN 13  CREATININE 1.16*  CALCIUM 9.1   Liver Function Tests:  Recent Labs Lab 01/28/15 2031  AST 27  ALT 23  ALKPHOS 150*  BILITOT 1.1  PROT 6.6  ALBUMIN 3.1*    Recent Labs Lab 01/28/15 2031  LIPASE 26   No results for input(s): AMMONIA in the last 168 hours. CBC:  Recent Labs Lab 01/28/15 2031  WBC 10.3  NEUTROABS 7.1  HGB 14.7  HCT 45.8  MCV 90.0  PLT 411*   Cardiac Enzymes: No results for input(s): CKTOTAL, CKMB, CKMBINDEX, TROPONINI in  the last 168 hours. BNP: Invalid input(s): POCBNP CBG: No results for input(s): GLUCAP in the last 168 hours.  Radiological Exams on Admission: Dg Chest Portable 1 View  01/29/2015   CLINICAL DATA:  Cough for 3 weeks, with bilateral lower extremity swelling. Initial encounter.  EXAM: PORTABLE CHEST - 1 VIEW  COMPARISON:  Chest radiograph performed 09/13/2014  FINDINGS: A moderate right-sided pleural effusion is again noted, with right basilar airspace opacification, concerning for either pneumonia or asymmetric pulmonary edema. More mild left basilar airspace opacity is noted. No pneumothorax is seen.  The cardiomediastinal silhouette is borderline enlarged. No acute osseous abnormalities are identified.  IMPRESSION: Moderate right pleural effusion, with right basilar airspace opacification, concerning for either pneumonia or asymmetric pulmonary edema. More mild left basilar airspace opacity noted. Borderline cardiomegaly.   Electronically Signed   By: Roanna Raider M.D.   On: 01/29/2015 03:29    EKG: pending  Assessment/Plan Principal Problem:   CAP (community acquired pneumonia)  Active Problems:   HLD (hyperlipidemia)   Tobacco abuse   Acute kidney injury   HTN (hypertension)   Cardiomyopathy, ischemic, EF 30-35 % Echo 05/04/14.   Acute systolic CHF (congestive heart failure), NYHA class 3   CAD (coronary artery disease)   Hypokalemia   Pneumonia, community acquired   Acute CHF exacerbation -Admit to telemetry -Likely contributed by underlying pneumonia. Will place on IV Lasix 80 mg twice daily. Weight increased by almost 12 pounds from baseline. Monitor daily weight and strict I/O. Fluid restriction to 1200 cc / day. -Has underlying ischemic cardiomyopathy with Recent echo within the last 3 months so will not repeat it. -Not on aspirin due to allergy, continue Coreg 6.25 mg twice a day ,bidil and Aldactone. Not on ACEi/ ARB due to ? Renal dysfn / allergy. add Potassium  supplement. -Heart failure team consulted.    Community acquired pneumonia continue IV rocephin and azithromycin. Follow blood cx. Sputum cx, urine for strep and legionella and HIV ab 02 via [place] . Will order prn albuterol nebs Will order US guided rt thoracentesis and send fluid for studies.  Coronary artery disease with STEMI s/p DES Continue  Brilinta , BB and statin. Check lipid panel in am  Nausea and vomiting Likely contributed by underlying pneumonia. Will provide supportive care with antiemetics.  Tobacco abuse:  counseled on cessation. patient Plans to quit  hypokalemia Replenish  AKI  mild . Monitor on IV lasix  UTI  asymptomatic. Follow cx. On empiric abx  Diet:cardiac  DVT prophylaxis: sq lovenox   Code Status: full code Family Communication:  None at bedside Disposition Plan:  admit to tele  Eddie North [facility] Pager [phone]  Total time spent on admission :70 minutes  If 7PM-7AM, please contact night-coverage [email or web address] Password [email or web address] 01/29/2015, 8:25 AM

## 2015-01-29 NOTE — ED Notes (Signed)
Assisted pt to bedside commode.

## 2015-01-29 NOTE — Procedures (Signed)
  US guided Rt thora  1 liter yellow fluid Sent for labs  cxr pending

## 2015-01-29 NOTE — Consult Note (Addendum)
Advanced Heart Failure Team Consult Note  Referring Physician: Dhungel  Primary Cardiologist:  Ho  Reason for Consultation: ADHF  HPI:    Deborah Ho is a 64 y.o. female with a history of HTN, HLD,  tobacco abuse, CAD s/p anterior STEMI with early in-stent thorombosis for missed dose of Brillinta s/p PCI x 2 into LAD (04/2014) and chronic systolic HF with EF 35-40% who is admitted for ADHA and R pleural effusion.   Patient initially presented as anterior STEMI on 05/03/2014. Cardiac catheterization that time showed a 95% proximal LAD stenosis, 30% proximal left circumflex stenosis, 20% mild ramus intermedius stenosis, 70-80% long segment RCA stenosis. Patient's ejection fraction was noted to be 35% that time. She was placed on aspirin and Brilinta and eventually discharged on 05/03/2014. Patient came back to the [facility] hospital the following week on 05/09/2014 with a recurrent anterior MI because she missed one day of her Brilinta. Cardiac catheterization done on 6/24 showed subacute stent thrombosis in mid LAD which was treated with overlapping drug-eluting stent at the distal edge of the previous stent. Her LVEDP was noted at 23 mmHg at the time. Her ejection fraction was noted to be 35% on cardiac catheterization. She has had 3 heart failure admissions in September - December 2015.Marland Kitchen Echo from September showed an EF down to 15%. Review of her chart shows issues with non compliance and very poor insight into her issues. Echo repeated 11/13/14 and showed improved EF to 35-40%.   States that over past 10 days has noticed increased leg edema, nonproductive cough and PND despite being compliant with her lasix. Sasys she drinks a lot of water even though she has been told she shouldn't. Seen in ER today with marked volume overload and moderate R pleural effusion which was tapped (transudative). Baseline weight 193. Today 202.     Review of Systems: [y] = yes, [ ]  = no   General: Weight gain  Cove.Etienne ]; Weight loss [ ] ; Anorexia [ ] ; Fatigue Cove.Etienne ]; Fever [ ] ; Chills [ ] ; Weakness [ ]   Cardiac: Chest pain/pressure [ ] ; Resting SOB Cove.Etienne ]; Exertional SOB Cove.Etienne ]; Orthopnea [ y]; Pedal Edema [ y]; Palpitations [ ] ; Syncope [ ] ; Presyncope [ ] ; Paroxysmal nocturnal dyspnea[y ]  Pulmonary: Cough [ y]; Wheezing[ ] ; Hemoptysis[ ] ; Sputum [ ] ; Snoring [ ]   GI: Vomiting[y ]; Dysphagia[ ] ; Melena[ ] ; Hematochezia [ ] ; Heartburn[ ] ; Abdominal pain [ ] ; Constipation [ ] ; Diarrhea [ ] ; BRBPR [ ]   GU: Hematuria[ ] ; Dysuria [ ] ; Nocturia[ ]   Vascular: Pain in legs with walking [ ] ; Pain in feet with lying flat [ ] ; Non-healing sores [ ] ; Stroke [ ] ; TIA [ ] ; Slurred speech [ ] ;  Neuro: Headaches[ ] ; Vertigo[ ] ; Seizures[ ] ; Paresthesias[ ] ;Blurred vision [ ] ; Diplopia [ ] ; Vision changes [ ]   Ortho/Skin: Arthritis Cove.Etienne ]; Joint pain [ y]; Muscle pain [ ] ; Joint swelling [ ] ; Back Pain [ ] ; Rash [ ]   Psych: Depression[ ] ; Anxiety[ ]   Heme: Bleeding problems [ ] ; Clotting disorders [ ] ; Anemia [ ]   Endocrine: Diabetes [ ] ; Thyroid dysfunction[ ]   Home Medications Prior to Admission medications   Medication Sig Start Date End Date Taking? Authorizing Provider  atorvastatin (LIPITOR) 80 MG tablet Take 0.5 tablets (40 mg total) by mouth daily at 6 PM. Patient taking differently: Take 40 mg by mouth every 7 (seven) days. Monday 12/12/14  Yes Deborah M Swaziland, MD  cetirizine Harless Nakayama)  10 MG tablet Take 10 mg by mouth daily as needed for allergies.   Yes Historical Provider, MD  furosemide (LASIX) 40 MG tablet Take 1 tablet (40 mg total) by mouth 2 (two) times daily. 08/22/14  Yes Aundria Rud, NP  hydrALAZINE (APRESOLINE) 25 MG tablet Take 1 tablet (25 mg total) by mouth every 8 (eight) hours. 12/12/14  Yes Deborah M Swazil[place], MD  nitroGLYCERIN (NITROSTAT) 0.4 MG SL tablet Place 0.4 mg under the tongue every 5 (five) minutes as needed for chest pain.   Yes Historical Provider, MD  spironolactone (ALDACTONE) 25 MG tablet Take  1 tablet (25 mg total) by mouth daily. 12/12/14  Yes Deborah M [place]az[place], MD  ticagrelor (BRILINTA) 90 MG TABS tablet Take 1 tablet (90 mg total) by mouth 2 (two) times daily. 11/05/14  Yes Rosalio Macadamia, NP  potassium chloride SA (K-DUR,KLOR-CON) 20 MEQ tablet Take 1 tablet (20 mEq total) by mouth daily. Patient not taking: Reported on 01/28/2015 12/12/14   Deborah M Swaziland, MD    Past Medical History: Past Medical History  Diagnosis Date  . GERD (gastroesophageal reflux disease)     Probable  . CAD (coronary artery disease) 05/03/14; 05/09/14    a. anterior STEMI with early in-stent thorombosis for missed dose of Brillinta s/p PCI with DESx 2 into LAD (04/2014)  . HTN (hypertension)   . Hyperlipidemia   . Tobacco use   . Non compliance w medication regimen   . Ischemic cardiomyopathy     a. 04/2014 ECHO with EF 45-50% b.  Repeat 2D echo 08/14/14 with EF down at 15%. Life vest placed    Past Surgical History: Past Surgical History  Procedure Laterality Date  . Partial hysterectomy    . Cholecystectomy    . Coronary angioplasty with stent placement  05/03/14    STEMI- stent to LAD DES- Xience alpine  . Coronary angioplasty with stent placement  05/09/14    STEMI- overlapping stent to LAD, pt had missed a dose of Brilinta  . Left heart catheterization with coronary angiogram N/A 05/03/2014    Procedure: LEFT HEART CATHETERIZATION WITH CORONARY ANGIOGRAM;  Surgeon: Deborah M Swaziland, MD;  Location: [facility] CATH LAB;  Service: Cardiovascular;  Laterality: N/A;  . Percutaneous stent intervention  05/03/2014    Procedure: PERCUTANEOUS STENT INTERVENTION;  Surgeon: Deborah M Swaziland, MD;  Location: [facility] CATH LAB;  Service: Cardiovascular;;  DES Prox LAD   . Left heart catheterization with coronary angiogram N/A 05/09/2014    Procedure: LEFT HEART CATHETERIZATION WITH CORONARY ANGIOGRAM;  Surgeon: Corky Crafts, MD;  Location: [facility] CATH LAB;  Service: Cardiovascular;  Laterality: N/A;    Family History: Family  History  Problem Relation Age of Onset  . Diabetes Mother   . Hypertension Mother     Social History: History   Social History  . Marital Status: Widowed    Spouse Name: N/A  . Number of Children: N/A  . Years of Education: N/A   Occupational History  . not employed    Social History Main Topics  . Smoking status: Current Every Day Smoker -- 0.10 packs/day for .5 years    Types: Cigarettes  . Smokeless tobacco: Current User     Comment: down to 3 cigarettes daily (08/03/14)  . Alcohol Use: No  . Drug Use: No  . Sexual Activity: Not on file   Other Topics Concern  . None   Social History Narrative   Patient has 6 brothers and sisters  and none have known coronary artery disease. She lives alone in [place], but has several brothers in the area.    Allergies:  Allergies  Allergen Reactions  . Aspirin Swelling    Chewable children's aspirin makes patients tongue and face swell  . Effient [Prasugrel] Swelling    Patient's tongue and face swells  . Lactose Intolerance (Gi) Other (See Comments)    REACTION: stomach upset  . Robitussin Dm [Guaifenesin-Dm] Swelling    Patient's tongue swells  . Sulfa Antibiotics Swelling  . Wheat Bran Other (See Comments)    REACTION: unknown    Objective:    Vital Signs:   Temp:  [97.5 F (36.4 C)-98 F (36.7 C)] 97.5 F (36.4 C) (03/15 1634) Pulse Rate:  [91-102] 99 (03/15 1634) Resp:  [14-24] 20 (03/15 1634) BP: (135-182)/(84-111) 142/87 mmHg (03/15 1634) SpO2:  [87 %-100 %] 99 % (03/15 1634) Weight:  [91.763 kg (202 lb 4.8 oz)-93.469 kg (206 lb 1 oz)] 91.763 kg (202 lb 4.8 oz) (03/15 1634) Last BM Date: 01/29/15  Weight change: Filed Weights   01/28/15 2018 01/29/15 1634  Weight: 93.469 kg (206 lb 1 oz) 91.763 kg (202 lb 4.8 oz)    Intake/Output:   Intake/Output Summary (Last 24 hours) at 01/29/15 1746 Last data filed at 01/29/15 1640  Gross per 24 hour  Intake      0 ml  Output   2176 ml  Net  -2176 ml      Physical Exam: General:  Sitting in chair. No resp difficulty HEENT: normal Neck: supple. JVP jaw. Carotids 2+ bilat; no bruits. No lymphadenopathy or thryomegaly appreciated. Cor: PMI nondisplaced. Regular rate & rhythm. 2/6 MR no s3 Lungs: clear x decreased at R base Abdomen: soft, nontender, nondistended. No hepatosplenomegaly. No bruits or masses. Good bowel sounds. Extremities: no cyanosis, clubbing, rash, 2-3+ edema Neuro: alert & orientedx3, cranial nerves grossly intact. moves all 4 extremities w/o difficulty. Affect pleasant  Telemetry: SR  Labs: Basic Metabolic Panel:  Recent Labs Lab 01/28/15 2031  NA 145  K 3.4*  CL 108  CO2 25  GLUCOSE 123*  BUN 13  CREATININE 1.16*  CALCIUM 9.1    Liver Function Tests:  Recent Labs Lab 01/28/15 2031  AST 27  ALT 23  ALKPHOS 150*  BILITOT 1.1  PROT 6.6  ALBUMIN 3.1*    Recent Labs Lab 01/28/15 2031  LIPASE 26   No results for input(s): AMMONIA in the last 168 hours.  CBC:  Recent Labs Lab 01/28/15 2031  WBC 10.3  NEUTROABS 7.1  HGB 14.7  HCT 45.8  MCV 90.0  PLT 411*    Cardiac Enzymes: No results for input(s): CKTOTAL, CKMB, CKMBINDEX, TROPONINI in the last 168 hours.  BNP: BNP (last 3 results) No results for input(s): BNP in the last 8760 hours.  ProBNP (last 3 results)  Recent Labs  08/22/14 1226 09/13/14 1854 11/05/14 1143  PROBNP 3059.0* 12687.0* 321.0*     CBG: No results for input(s): GLUCAP in the last 168 hours.  Coagulation Studies: No results for input(s): LABPROT, INR in the last 72 hours.  Other results: EKG: NSR 91 anterior and inferior Qs  Imaging: Dg Chest 1 View  01/29/2015   CLINICAL DATA:  Pleural effusion.  Post right thoracentesis.  EXAM: CHEST  1 VIEW  COMPARISON:  01/29/2015  FINDINGS: Cardiomegaly. Mediastinal contours are within normal missed. Small right pleural effusion, decreased since prior study. No pneumothorax following thoracentesis. Right  basilar opacity likely reflects  atelectasis, also improved.  IMPRESSION: Improving right effusion and right base atelectasis. No pneumothorax following thoracentesis.   Electronically Signed   By: Charlett Nose M.D.   On: 01/29/2015 09:54   Dg Chest Portable 1 View  01/29/2015   CLINICAL DATA:  Cough for 3 weeks, with bilateral lower extremity swelling. Initial encounter.  EXAM: PORTABLE CHEST - 1 VIEW  COMPARISON:  Chest radiograph performed 09/13/2014  FINDINGS: A moderate right-sided pleural effusion is again noted, with right basilar airspace opacification, concerning for either pneumonia or asymmetric pulmonary edema. More mild left basilar airspace opacity is noted. No pneumothorax is seen.  The cardiomediastinal silhouette is borderline enlarged. No acute osseous abnormalities are identified.  IMPRESSION: Moderate right pleural effusion, with right basilar airspace opacification, concerning for either pneumonia or asymmetric pulmonary edema. More mild left basilar airspace opacity noted. Borderline cardiomegaly.   Electronically Signed   By: Roanna Raider M.D.   On: 01/29/2015 03:29   US Thoracentesis Asp Pleural Space W/img Guide  01/29/2015   INDICATION: Symptomatic Rt sided pleural effusion  EXAM: US THORACENTESIS ASP PLEURAL SPACE W/IMG GUIDE  COMPARISON:  None.  MEDICATIONS: 10 cc 1% lidocaine  COMPLICATIONS: None immediate  TECHNIQUE: Informed written consent was obtained from the patient after a discussion of the risks, benefits and alternatives to treatment. A timeout was performed prior to the initiation of the procedure.  Initial ultrasound scanning demonstrates a Right pleural effusion. The lower chest was prepped and draped in the usual sterile fashion. 1% lidocaine was used for local anesthesia.  Under direct ultrasound guidance, a 19 gauge, 7-cm, Yueh catheter was introduced. An ultrasound image was saved for documentation purposes. the thoracentesis was performed. The catheter was removed  and a dressing was applied. The patient tolerated the procedure well without immediate post procedural complication. The patient was escorted to have an upright chest radiograph.  FINDINGS: A total of approximately 1 liters of yellow fluid was removed. Requested samples were sent to the laboratory.  IMPRESSION: Successful ultrasound-guided R sided thoracentesis yielding 1 liters of pleural fluid.  Read by:  Robet Leu [facility]   Electronically Signed   By: Simonne Come M.D.   On: 01/29/2015 11:56      Medications:     Current Medications: . atorvastatin  40 mg Oral q1800  . [START ON 01/30/2015] azithromycin  500 mg Oral Q24H  . benzonatate  200 mg Oral TID  . carvedilol  6.25 mg Oral BID WC  . [START ON 01/30/2015] cefTRIAXone (ROCEPHIN)  IV  1 g Intravenous Q24H  . enoxaparin (LOVENOX) injection  40 mg Subcutaneous Q24H  . furosemide  80 mg Intravenous Q12H  . isosorbide-hydrALAZINE  1 tablet Oral BID  . lidocaine (PF)      . loratadine  10 mg Oral Daily  . potassium chloride SA  40 mEq Oral Daily  . sodium chloride  3 mL Intravenous Q12H  . spironolactone  25 mg Oral Daily  . ticagrelor  90 mg Oral BID     Infusions:      Assessment:   1. Acute on chronic systolic HF 2. Ischemic CM EF 35% by echo 12/15 3. R pleural effusion   --s/p thoracentesis 01/29/15 (transudative) 4. CAD s/p anterior MI 5. Dietary non compliance 6. Acute respiratory failure, improved 7. Hypokalemia 8. ASA allergy 9. Tobacco use  Plan/Discussion:     She has significant volume overload in the setting of dietary indiscretion and poor insight into her HF. Agree with  diuresing with lasix 80 iv bid. Check repeat echo. Supp K+. Consider switching lasix to demadex on d/c. Unclear whys he is no longer on ACE or ARB - will start losartan.  Will need f/u in HF Clinic and Paramedicine. (we have seen her before in HF Clinic but she stopped coming after 12- visits). Continue Brilinta. Counseled on need to quit  smoking.   I doubt she has PNA.   We will follow.   Length of Stay: 0  Arvilla Meres MD 01/29/2015, 5:46 PM  Advanced Heart Failure Team Pager [phone] (M-F; 7a - 4p)  Please contact [facility] for night-coverage after hours (4p -7a ) and weekends on [email or web address]

## 2015-01-29 NOTE — ED Provider Notes (Signed)
CSN: [id number]     Arrival date & time 01/28/15  2001 History  This chart was scribed for Blane Ohara, MD by Tanda Rockers, ED Scribe. This patient was seen in room A02C/A02C and the patient's care was started at 2:50 AM.    Chief Complaint  Patient presents with  . Cough  . Emesis  . Diarrhea   Patient is a 64 y.o. female presenting with cough, vomiting, and diarrhea. The history is provided by the patient. No language interpreter was used.  Cough Associated symptoms: no chest pain, no chills, no fever, no headaches and no shortness of breath   Emesis Associated symptoms: diarrhea   Associated symptoms: no arthralgias, no chills and no headaches   Diarrhea Associated symptoms: vomiting   Associated symptoms: no arthralgias, no chills, no fever and no headaches      HPI Comments: Emylie Poppert is a 64 y.o. female who presents to the Emergency Department complaining of a cough, vomiting, and diarrhea that began 3 weeks ago. Pt also complains of swelling to bilateral feet that also began 3 weeks ago. She reports that her feet are less swollen in the mornings. Pt admits to weight gain recently as well. She states that she has doubled up on her Lasix with no relief. Pt also admits to not taking her Carvedilol in the last week due to losing her pills. She denies chest pain, shortness of breath. Denies recent sick contact with similar symptoms, increase in salt intake, or recent foreign travel   Past Medical History  Diagnosis Date  . GERD (gastroesophageal reflux disease)     Probable  . CAD (coronary artery disease) 05/03/14; 05/09/14    a. anterior STEMI with early in-stent thorombosis for missed dose of Brillinta s/p PCI with DESx 2 into LAD (04/2014)  . HTN (hypertension)   . Hyperlipidemia   . Tobacco use   . Non compliance w medication regimen   . Ischemic cardiomyopathy     a. 04/2014 ECHO with EF 45-50% b.  Repeat 2D echo 08/14/14 with EF down at 15%. Life vest placed   Past  Surgical History  Procedure Laterality Date  . Partial hysterectomy    . Cholecystectomy    . Coronary angioplasty with stent placement  05/03/14    STEMI- stent to LAD DES- Xience alpine  . Coronary angioplasty with stent placement  05/09/14    STEMI- overlapping stent to LAD, pt had missed a dose of Brilinta  . Left heart catheterization with coronary angiogram N/A 05/03/2014    Procedure: LEFT HEART CATHETERIZATION WITH CORONARY ANGIOGRAM;  Surgeon: Peter M Swaziland, MD;  Location: [facility] CATH LAB;  Service: Cardiovascular;  Laterality: N/A;  . Percutaneous stent intervention  05/03/2014    Procedure: PERCUTANEOUS STENT INTERVENTION;  Surgeon: Peter M Swaziland, MD;  Location: [facility] CATH LAB;  Service: Cardiovascular;;  DES Prox LAD   . Left heart catheterization with coronary angiogram N/A 05/09/2014    Procedure: LEFT HEART CATHETERIZATION WITH CORONARY ANGIOGRAM;  Surgeon: Corky Crafts, MD;  Location: [facility] CATH LAB;  Service: Cardiovascular;  Laterality: N/A;   Family History  Problem Relation Age of Onset  . Diabetes Mother   . Hypertension Mother    History  Substance Use Topics  . Smoking status: Current Every Day Smoker -- 0.10 packs/day for .5 years    Types: Cigarettes  . Smokeless tobacco: Current User     Comment: down to 3 cigarettes daily (08/03/14)  . Alcohol Use: No  OB History    No data available     Review of Systems  Constitutional: Negative for fever and chills.  Respiratory: Positive for cough. Negative for shortness of breath.   Cardiovascular: Positive for leg swelling. Negative for chest pain.  Gastrointestinal: Positive for vomiting and diarrhea.  Musculoskeletal: Negative for arthralgias.  Neurological: Negative for headaches.  All other systems reviewed and are negative.     Allergies  Aspirin; Effient; Lactose intolerance (gi); Robitussin dm; Sulfa antibiotics; and Wheat bran  Home Medications   Prior to Admission medications   Medication Sig  Start Date End Date Taking? Authorizing Provider  atorvastatin (LIPITOR) 80 MG tablet Take 0.5 tablets (40 mg total) by mouth daily at 6 PM. Patient taking differently: Take 40 mg by mouth every 7 (seven) days. Monday 12/12/14  Yes Peter M Swaziland, MD  cetirizine (ZYRTEC) 10 MG tablet Take 10 mg by mouth daily as needed for allergies.   Yes Historical Provider, MD  furosemide (LASIX) 40 MG tablet Take 1 tablet (40 mg total) by mouth 2 (two) times daily. 08/22/14  Yes Aundria Rud, NP  hydrALAZINE (APRESOLINE) 25 MG tablet Take 1 tablet (25 mg total) by mouth every 8 (eight) hours. 12/12/14  Yes Peter M Swaziland, MD  nitroGLYCERIN (NITROSTAT) 0.4 MG SL tablet Place 0.4 mg under the tongue every 5 (five) minutes as needed for chest pain.   Yes Historical Provider, MD  spironolactone (ALDACTONE) 25 MG tablet Take 1 tablet (25 mg total) by mouth daily. 12/12/14  Yes Peter M Swaziland, MD  ticagrelor (BRILINTA) 90 MG TABS tablet Take 1 tablet (90 mg total) by mouth 2 (two) times daily. 11/05/14  Yes Rosalio Macadamia, NP  potassium chloride SA (K-DUR,KLOR-CON) 20 MEQ tablet Take 1 tablet (20 mEq total) by mouth daily. Patient not taking: Reported on 01/28/2015 12/12/14   Peter M Swaziland, MD   Triage Vitals: BP 177/109 mmHg  Pulse 98  Temp(Src) 97.6 F (36.4 C) (Oral)  Resp 24  Ht 5\' 5"  (1.651 m)  Wt 206 lb 1 oz (93.469 kg)  BMI 34.29 kg/m2  SpO2 100%   Physical Exam  Constitutional: She is oriented to person, place, and time. She appears well-developed and well-nourished. No distress.  HENT:  Head: Normocephalic and atraumatic.  Eyes: Conjunctivae and EOM are normal.  Neck: Neck supple. No tracheal deviation present.  Cardiovascular: Normal rate, regular rhythm and normal heart sounds.   Pulmonary/Chest: Effort normal. No respiratory distress. She has rales (Right base. ).  Abdominal: Soft. There is no tenderness. There is no guarding.  Musculoskeletal: Normal range of motion. She exhibits edema (1+  pitting edema to ankles and LEs bilaterally. ).  Neurological: She is alert and oriented to person, place, and time.  Skin: Skin is warm and dry.  Psychiatric: She has a normal mood and affect. Her behavior is normal.  Nursing note and vitals reviewed.   ED Course  Procedures (including critical care time)  DIAGNOSTIC STUDIES: Oxygen Saturation is 100% on RA, normal by my interpretation.    COORDINATION OF CARE: 2:54 AM-Discussed treatment plan which includes CXR, CBC, CMP, Lipase, UA with pt at bedside and pt agreed to plan.   Labs Review Labs Reviewed  CBC WITH DIFFERENTIAL/PLATELET - Abnormal; Notable for the following:    Platelets 411 (*)    All other components within normal limits  COMPREHENSIVE METABOLIC PANEL - Abnormal; Notable for the following:    Potassium 3.4 (*)    Glucose, Bld  123 (*)    Creatinine, Ser 1.16 (*)    Albumin 3.1 (*)    Alkaline Phosphatase 150 (*)    GFR calc non Af Amer 49 (*)    GFR calc Af Amer 57 (*)    All other components within normal limits  URINALYSIS, ROUTINE W REFLEX MICROSCOPIC - Abnormal; Notable for the following:    Color, Urine AMBER (*)    APPearance CLOUDY (*)    Hgb urine dipstick LARGE (*)    Protein, ur 30 (*)    Urobilinogen, UA 2.0 (*)    Nitrite POSITIVE (*)    Leukocytes, UA MODERATE (*)    All other components within normal limits  URINE MICROSCOPIC-ADD ON - Abnormal; Notable for the following:    Squamous Epithelial / LPF FEW (*)    Bacteria, UA MANY (*)    All other components within normal limits  LIPASE, BLOOD  BRAIN NATRIURETIC PEPTIDE    Imaging Review Dg Chest Portable 1 View  01/29/2015   CLINICAL DATA:  Cough for 3 weeks, with bilateral lower extremity swelling. Initial encounter.  EXAM: PORTABLE CHEST - 1 VIEW  COMPARISON:  Chest radiograph performed 09/13/2014  FINDINGS: A moderate right-sided pleural effusion is again noted, with right basilar airspace opacification, concerning for either pneumonia  or asymmetric pulmonary edema. More mild left basilar airspace opacity is noted. No pneumothorax is seen.  The cardiomediastinal silhouette is borderline enlarged. No acute osseous abnormalities are identified.  IMPRESSION: Moderate right pleural effusion, with right basilar airspace opacification, concerning for either pneumonia or asymmetric pulmonary edema. More mild left basilar airspace opacity noted. Borderline cardiomegaly.   Electronically Signed   By: Roanna Raider M.D.   On: 01/29/2015 03:29     EKG Interpretation None      MDM   Final diagnoses:  Essential hypertension  CAP (community acquired pneumonia)  Pleural effusion, right  Nausea vomiting and diarrhea  Bilateral leg edema  Acute on chronic systolic CHF (congestive heart failure)  UTI  Patient presents with recurrent vomiting and diarrhea for a few weeks, sick contacts. Discussed clinically likely viral process gastroenteritis. Patient also has had leg swelling and abdominal swelling with congestive heart failure history. Patient has a few crackles at the bases and leg swelling clinically. Patient has not been taking her blood pressure med regularly however has been taking Lasix 40 mg twice a day. Discussed patient will need very close follow-up with cardiology for blood pressure management and heart failure management.  Chest x-ray reviewed concerning for infiltrate versus pulmonary edema. With persisting cough general malaise antibiotics given as well as Lasix. Patient's blood pressure 180 systolic.  Discussed admission with [facility]ad hospitalist who accepted  The patients results and plan were reviewed and discussed.   Any x-rays performed were personally reviewed by myself.   Differential diagnosis were considered with the presenting HPI.  Medications  furosemide (LASIX) tablet 40 mg (40 mg Oral Given 01/29/15 0339)  levofloxacin (LEVAQUIN) IVPB 750 mg (750 mg Intravenous New Bag/Given 01/29/15 0437)  morphine 4 MG/ML  injection 4 mg (4 mg Intravenous Given 01/29/15 0434)    Filed Vitals:   01/29/15 0025 01/29/15 0331 01/29/15 0345 01/29/15 0600  BP: 177/109 168/111  163/90  Pulse: 98 101 100 97  Temp: 97.6 F (36.4 C)     TempSrc: Oral     Resp: 24 22    Height:      Weight:      SpO2: 100% 97% 96% 93%  Final diagnoses:  Essential hypertension  CAP (community acquired pneumonia)  Pleural effusion, right  Nausea vomiting and diarrhea  Bilateral leg edema  Acute on chronic systolic CHF (congestive heart failure)    Admission/ observation were discussed with the admitting physician, patient and/or family and they are comfortable with the plan.     Blane Ohara, MD 01/29/15 [phone]

## 2015-01-29 NOTE — ED Notes (Signed)
Pt assisted back to bed from bedside commode. 

## 2015-01-30 DIAGNOSIS — I501 Left ventricular failure: Secondary | ICD-10-CM

## 2015-01-30 DIAGNOSIS — N179 Acute kidney failure, unspecified: Secondary | ICD-10-CM

## 2015-01-30 DIAGNOSIS — J90 Pleural effusion, not elsewhere classified: Secondary | ICD-10-CM | POA: Insufficient documentation

## 2015-01-30 LAB — HEPATIC FUNCTION PANEL
ALT: 20 U/L (ref 0–35)
AST: 27 U/L (ref 0–37)
Albumin: 3 g/dL — ABNORMAL LOW (ref 3.5–5.2)
Alkaline Phosphatase: 148 U/L — ABNORMAL HIGH (ref 39–117)
Bilirubin, Direct: 0.3 mg/dL (ref 0.0–0.5)
Indirect Bilirubin: 0.5 mg/dL (ref 0.3–0.9)
Total Bilirubin: 0.8 mg/dL (ref 0.3–1.2)
Total Protein: 5.8 g/dL — ABNORMAL LOW (ref 6.0–8.3)

## 2015-01-30 LAB — BASIC METABOLIC PANEL
Anion gap: 7 (ref 5–15)
BUN: 18 mg/dL (ref 6–23)
CO2: 30 mmol/L (ref 19–32)
Calcium: 8.9 mg/dL (ref 8.4–10.5)
Chloride: 105 mmol/L (ref 96–112)
Creatinine, Ser: 1.34 mg/dL — ABNORMAL HIGH (ref 0.50–1.10)
GFR calc Af Amer: 48 mL/min — ABNORMAL LOW (ref >90)
GFR calc non Af Amer: 41 mL/min — ABNORMAL LOW (ref >90)
Glucose, Bld: 104 mg/dL — ABNORMAL HIGH (ref 70–99)
Potassium: 3.4 mmol/L — ABNORMAL LOW (ref 3.5–5.1)
Sodium: 142 mmol/L (ref 135–145)

## 2015-01-30 LAB — LEGIONELLA ANTIGEN, URINE

## 2015-01-30 LAB — LACTATE DEHYDROGENASE: LDH: 261 U/L — ABNORMAL HIGH (ref 94–250)

## 2015-01-30 LAB — PATHOLOGIST SMEAR REVIEW: Path Review: REACTIVE

## 2015-01-30 MED ORDER — OXYCODONE HCL 5 MG PO TABS
5.0000 mg | ORAL_TABLET | Freq: Once | ORAL | Status: AC
  Administered 2015-01-30: 5 mg via ORAL
  Filled 2015-01-30: qty 1

## 2015-01-30 MED ORDER — METOLAZONE 2.5 MG PO TABS
2.5000 mg | ORAL_TABLET | Freq: Every day | ORAL | Status: DC
  Administered 2015-01-30 – 2015-01-31 (×2): 2.5 mg via ORAL
  Filled 2015-01-30 (×2): qty 1

## 2015-01-30 MED ORDER — POTASSIUM CHLORIDE CRYS ER 20 MEQ PO TBCR
40.0000 meq | EXTENDED_RELEASE_TABLET | Freq: Once | ORAL | Status: AC
  Administered 2015-01-30: 40 meq via ORAL
  Filled 2015-01-30: qty 2

## 2015-01-30 NOTE — Plan of Care (Signed)
Problem: Acute Rehab PT Goals(only PT should resolve) Goal: Pt Will Ambulate With no AD and safe reciprocal pattern

## 2015-01-30 NOTE — Progress Notes (Signed)
I cosign all documentation and medication administration for this shift by Komicia S Jefferies, Student RN. 

## 2015-01-30 NOTE — Progress Notes (Signed)
Pt. Sitting up in the chair, no concerns at this time, instructed pt to elevated legs while in recliner.

## 2015-01-30 NOTE — Evaluation (Signed)
Physical Therapy Evaluation Patient Details Name: Deborah Ho MRN: [id number] DOB: Sep 24, 1951 Today's Date: 01/30/2015   History of Present Illness  64 yo female with onset of cough and resp failure with thoracentesis on 64/15  Clinical Impression  Pt was assessed for her gait after having thoracentesis yesterday.  Pt is walking a bit flat footed after edema in LE's has made her stiff and due to limits of ROM and strength will keep her on PT.  Her plan is to follow up with outpatient therapy and pt has agreed to this.    Follow Up Recommendations Outpatient PT    Equipment Recommendations  None recommended by PT    Recommendations for Other Services       Precautions / Restrictions Precautions Precautions: Fall Restrictions Weight Bearing Restrictions: No      Mobility  Bed Mobility               General bed mobility comments: up when PT entered  Transfers Overall transfer level: Modified independent Equipment used: None             General transfer comment: pt used handrails on chair to stand  Ambulation/Gait Ambulation/Gait assistance: Supervision;Min guard Ambulation Distance (Feet): 250 Feet Assistive device: 1 person hand held assist (cga to close S) Gait Pattern/deviations: Step-through pattern;Decreased stride length;Wide base of support;Drifts right/left Gait velocity: reduced Gait velocity interpretation: Below normal speed for age/gender General Gait Details: flat footed gait due to limited DF and hip ext  Stairs            Wheelchair Mobility    Modified Rankin (Stroke Patients Only)       Balance Overall balance assessment: Needs assistance Sitting-balance support: Feet supported Sitting balance-Leahy Scale: Good   Postural control: Posterior lean Standing balance support:  (PT had contact on her upper back at times) Standing balance-Leahy Scale: Fair Standing balance comment: fair to fair- dynamically                             Pertinent Vitals/Pain Pain Assessment: Faces Faces Pain Scale: Hurts little more Pain Location: feet related to edema Pain Intervention(s): Limited activity within patient's tolerance;Monitored during session;Premedicated before session    Home Living Family/patient expects to be discharged to:: Private residence Living Arrangements: Alone Available Help at Discharge: Family;Friend(s);Available PRN/intermittently Type of Home: Apartment Home Access: Level entry;Elevator     Home Layout: One level Home Equipment: Grab bars - toilet;Grab bars - tub/shower      Prior Function Level of Independence: Independent               Hand Dominance   Dominant Hand: Right    Extremity/Trunk Assessment   Upper Extremity Assessment: Overall WFL for tasks assessed           Lower Extremity Assessment: Generalized weakness      Cervical / Trunk Assessment: Normal  Communication   Communication: No difficulties  Cognition Arousal/Alertness: Awake/alert Behavior During Therapy: WFL for tasks assessed/performed Overall Cognitive Status: Within Functional Limits for tasks assessed                      General Comments General comments (skin integrity, edema, etc.): Pt has LE edema that has contributed to strength and ROM changes in LE's and has contributed to gait changes     Exercises        Assessment/Plan    PT Assessment Patient needs  continued PT services  PT Diagnosis Abnormality of gait   PT Problem List Decreased strength;Decreased range of motion;Decreased balance;Decreased mobility;Decreased coordination;Cardiopulmonary status limiting activity;Decreased skin integrity;Pain  PT Treatment Interventions DME instruction;Gait training;Functional mobility training;Therapeutic activities;Therapeutic exercise;Balance training;Neuromuscular re-education;Patient/family education   PT Goals (Current goals can be found in the Care Plan section)  Acute Rehab PT Goals Patient Stated Goal: to get home ASAP PT Goal Formulation: With patient Time For Goal Achievement: 02/06/15 Potential to Achieve Goals: Good    Frequency Min 2X/week   Barriers to discharge Decreased caregiver support outpatient therapy would be recommended as pt is alone    Co-evaluation               End of Session Equipment Utilized During Treatment: Gait belt Activity Tolerance: Patient tolerated treatment well Patient left: in chair;with call bell/phone within reach Nurse Communication: Mobility status         Time: 7017-7939 PT Time Calculation (min) (ACUTE ONLY): 25 min   Charges:   PT Evaluation $Initial PT Evaluation Tier I: 1 Procedure PT Treatments $Gait Training: 8-22 mins   PT G CodesIvar Drape 06-Feb-2015, 9:55 AM   Samul Dada, PT MS Acute Rehab Dept. Number: [phone]

## 2015-01-30 NOTE — Progress Notes (Addendum)
Advanced Heart Failure Rounding Note   Subjective:    Deborah Ho is a 64 y.o. female with a history of HTN, HLD, tobacco abuse, CAD s/p anterior STEMI with early in-stent thorombosis for missed dose of Brillinta s/p PCI x 2 into LAD (04/2014) and chronic systolic HF with EF 35-40% who is admitted for ADHF and R pleural effusion.   Effusion tapped on 3/15 (transudative)  Diuresing well. Weight down 3 more pounds. Creatinine bumped 1.2->1.3  Objective:   Weight Range:  Vital Signs:   Temp:  [97.5 F (36.4 C)-98.2 F (36.8 C)] 98.1 F (36.7 C) (03/16 0107) Pulse Rate:  [87-101] 90 (03/16 0107) Resp:  [14-23] 20 (03/16 0107) BP: (130-168)/(80-111) 137/86 mmHg (03/16 0107) SpO2:  [87 %-99 %] 97 % (03/16 0107) Weight:  [91.763 kg (202 lb 4.8 oz)] 91.763 kg (202 lb 4.8 oz) (03/15 1634) Last BM Date: 01/29/15  Weight change: Filed Weights   01/28/15 2018 01/29/15 1634  Weight: 93.469 kg (206 lb 1 oz) 91.763 kg (202 lb 4.8 oz)    Intake/Output:   Intake/Output Summary (Last 24 hours) at 01/30/15 0319 Last data filed at 01/30/15 0109  Gross per 24 hour  Intake    300 ml  Output   4376 ml  Net  -4076 ml     Physical Exam: General: Sitting in chair. No resp difficulty HEENT: normal Neck: supple. JVP jaw. Carotids 2+ bilat; no bruits. No lymphadenopathy or thryomegaly appreciated. Cor: PMI nondisplaced. Regular rate & rhythm. 2/6 MR no s3 Lungs: clear x decreased at R base Abdomen: soft, nontender, nondistended. No hepatosplenomegaly. No bruits or masses. Good bowel sounds. Extremities: no cyanosis, clubbing, rash, 2-3+ edema Neuro: alert & orientedx3, cranial nerves grossly intact. moves all 4 extremities w/o difficulty. Affect pleasant  Telemetry: SR 80s (personally reviewed)  Labs: Basic Metabolic Panel:  Recent Labs Lab 01/28/15 2031  NA 145  K 3.4*  CL 108  CO2 25  GLUCOSE 123*  BUN 13  CREATININE 1.16*  CALCIUM 9.1    Liver Function Tests:  Recent  Labs Lab 01/28/15 2031  AST 27  ALT 23  ALKPHOS 150*  BILITOT 1.1  PROT 6.6  ALBUMIN 3.1*    Recent Labs Lab 01/28/15 2031  LIPASE 26   No results for input(s): AMMONIA in the last 168 hours.  CBC:  Recent Labs Lab 01/28/15 2031  WBC 10.3  NEUTROABS 7.1  HGB 14.7  HCT 45.8  MCV 90.0  PLT 411*    Cardiac Enzymes: No results for input(s): CKTOTAL, CKMB, CKMBINDEX, TROPONINI in the last 168 hours.  BNP: BNP (last 3 results) No results for input(s): BNP in the last 8760 hours.  ProBNP (last 3 results)  Recent Labs  08/22/14 1226 09/13/14 1854 11/05/14 1143  PROBNP 3059.0* 12687.0* 321.0*      Other results:  Imaging: Dg Chest 1 View  01/29/2015   CLINICAL DATA:  Pleural effusion.  Post right thoracentesis.  EXAM: CHEST  1 VIEW  COMPARISON:  01/29/2015  FINDINGS: Cardiomegaly. Mediastinal contours are within normal missed. Small right pleural effusion, decreased since prior study. No pneumothorax following thoracentesis. Right basilar opacity likely reflects atelectasis, also improved.  IMPRESSION: Improving right effusion and right base atelectasis. No pneumothorax following thoracentesis.   Electronically Signed   By: Charlett Nose M.D.   On: 01/29/2015 09:54   Dg Chest Portable 1 View  01/29/2015   CLINICAL DATA:  Cough for 3 weeks, with bilateral lower extremity swelling. Initial encounter.  EXAM: PORTABLE CHEST - 1 VIEW  COMPARISON:  Chest radiograph performed 09/13/2014  FINDINGS: A moderate right-sided pleural effusion is again noted, with right basilar airspace opacification, concerning for either pneumonia or asymmetric pulmonary edema. More mild left basilar airspace opacity is noted. No pneumothorax is seen.  The cardiomediastinal silhouette is borderline enlarged. No acute osseous abnormalities are identified.  IMPRESSION: Moderate right pleural effusion, with right basilar airspace opacification, concerning for either pneumonia or asymmetric pulmonary  edema. More mild left basilar airspace opacity noted. Borderline cardiomegaly.   Electronically Signed   By: Roanna Raider M.D.   On: 01/29/2015 03:29   US Thoracentesis Asp Pleural Space W/img Guide  01/29/2015   INDICATION: Symptomatic Rt sided pleural effusion  EXAM: US THORACENTESIS ASP PLEURAL SPACE W/IMG GUIDE  COMPARISON:  None.  MEDICATIONS: 10 cc 1% lidocaine  COMPLICATIONS: None immediate  TECHNIQUE: Informed written consent was obtained from the patient after a discussion of the risks, benefits and alternatives to treatment. A timeout was performed prior to the initiation of the procedure.  Initial ultrasound scanning demonstrates a Right pleural effusion. The lower chest was prepped and draped in the usual sterile fashion. 1% lidocaine was used for local anesthesia.  Under direct ultrasound guidance, a 19 gauge, 7-cm, Yueh catheter was introduced. An ultrasound image was saved for documentation purposes. the thoracentesis was performed. The catheter was removed and a dressing was applied. The patient tolerated the procedure well without immediate post procedural complication. The patient was escorted to have an upright chest radiograph.  FINDINGS: A total of approximately 1 liters of yellow fluid was removed. Requested samples were sent to the laboratory.  IMPRESSION: Successful ultrasound-guided R sided thoracentesis yielding 1 liters of pleural fluid.  Read by:  Robet Leu [facility]   Electronically Signed   By: Simonne Come M.D.   On: 01/29/2015 11:56      Medications:     Scheduled Medications: . atorvastatin  40 mg Oral q1800  . azithromycin  500 mg Oral Q24H  . benzonatate  200 mg Oral TID  . carvedilol  6.25 mg Oral BID WC  . cefTRIAXone (ROCEPHIN)  IV  1 g Intravenous Q24H  . enoxaparin (LOVENOX) injection  40 mg Subcutaneous Q24H  . furosemide  80 mg Intravenous Q12H  . isosorbide-hydrALAZINE  1 tablet Oral BID  . loratadine  10 mg Oral Daily  . losartan  25 mg Oral Daily  .  potassium chloride SA  40 mEq Oral Daily  . sodium chloride  3 mL Intravenous Q12H  . spironolactone  25 mg Oral Daily  . ticagrelor  90 mg Oral BID     Infusions:     PRN Medications:  sodium chloride, acetaminophen, albuterol, nitroGLYCERIN, ondansetron (ZOFRAN) IV, sodium chloride   Assessment:   1. Acute on chronic systolic HF 2. Ischemic CM EF 35% by echo 12/15 3. R pleural effusion  --s/p thoracentesis 01/29/15 (transudative) 4. CAD s/p anterior MI 5. Dietary non compliance 6. Acute respiratory failure, improved 7. Hypokalemia 8. ASA allergy 9. Tobacco use  Plan/Discussion:     Diuresing well.   Repeat echo pending. Supp K+. Still significantly volume overloaded. Continue IV lasix. Start metolazone.  Place TED hose. Consider switching lasix to demadex on d/c.   Compliance with dietary restriction will be a major issue. Will need f/u in HF Clinic and Paramedicine. (we have seen her before in HF Clinic but she stopped coming after 12- visits).   Continue Brilinta for CAD.  Counseled on need to quit smoking.   Length of Stay: 1  Arvilla Meres MD 01/30/2015, 3:19 AM  Advanced Heart Failure Team Pager [phone] (M-F; 7a - 4p)  Please contact [facility] for night-coverage after hours (4p -7a ) and weekends on [email or web address]

## 2015-01-30 NOTE — Progress Notes (Signed)
[facility] PROGRESS NOTE  Deborah Ho KXF:[id number] DOB: 1951/04/30 DOA: 01/29/2015 PCP: Deborah Kanaris Ho, Deborah Ho  Assessment/Plan: Acute CHF exacerbation -Continue with  IV Lasix 80 mg twice daily.  -ECHO ordered.  -Not on aspirin due to allergy, continue Coreg 6.25 mg twice a day ,bidil and Aldactone. -Heart failure team following.  -Weight: 93---91--90  -negative 3 L.  -started on losartan.   Right pleura effusion:  S/P thoracentesis 3-15. No WBC on pleural fluid.  Check LDH and protein.  Legionella and strep negative.  Afebrile, no leukocytosis.  Will discontinue antibiotics.    Coronary artery disease with STEMI s/p DES Continue Brilinta , BB and statin.   Nausea and vomiting Resolved.   Tobacco abuse: counseled on cessation. patient Plans to quit  hypokalemia Replenish  AKI mild . Monitor on IV lasix  Pyuria:  asymptomatic. Follow cx.   Diet:cardiac  DVT prophylaxis: sq lovenox  Code Status: Full code.  Family Communication: Care discussed with patient  Disposition Plan: remain inpatient for diuresis   Consultants:  Cardiology  Procedures:  ECHO  Antibiotics:    HPI/Subjective: She is feeling better, breathing better,. Cough almost resolved.   Objective: Filed Vitals:   01/30/15 0944  BP: 112/66  Pulse: 86  Temp: 97.5 F (36.4 C)  Resp: 20    Intake/Output Summary (Last 24 hours) at 01/30/15 1207 Last data filed at 01/30/15 1050  Gross per 24 hour  Intake    960 ml  Output   4926 ml  Net  -3966 ml   Filed Weights   01/28/15 2018 01/29/15 1634 01/30/15 0537  Weight: 93.469 kg (206 lb 1 oz) 91.763 kg (202 lb 4.8 oz) 90.447 kg (199 lb 6.4 oz)    Exam:   General:  Alert in no distress.   Cardiovascular: S 1, S 3 RRR  Respiratory: Bilateral air movement, crackles bases.   Abdomen: BS present, distended, no rigidity  Musculoskeletal: plus 2 edema.    Data Reviewed: Basic Metabolic Panel:  Recent  Labs Lab 01/28/15 2031 01/30/15 0432  NA 145 142  K 3.4* 3.4*  CL 108 105  CO2 25 30  GLUCOSE 123* 104*  BUN 13 18  CREATININE 1.16* 1.34*  CALCIUM 9.1 8.9   Liver Function Tests:  Recent Labs Lab 01/28/15 2031  AST 27  ALT 23  ALKPHOS 150*  BILITOT 1.1  PROT 6.6  ALBUMIN 3.1*    Recent Labs Lab 01/28/15 2031  LIPASE 26   No results for input(s): AMMONIA in the last 168 hours. CBC:  Recent Labs Lab 01/28/15 2031  WBC 10.3  NEUTROABS 7.1  HGB 14.7  HCT 45.8  MCV 90.0  PLT 411*   Cardiac Enzymes: No results for input(s): CKTOTAL, CKMB, CKMBINDEX, TROPONINI in the last 168 hours. BNP (last 3 results) No results for input(s): BNP in the last 8760 hours.  ProBNP (last 3 results)  Recent Labs  08/22/14 1226 09/13/14 1854 11/05/14 1143  PROBNP 3059.0* 12687.0* 321.0*    CBG: No results for input(s): GLUCAP in the last 168 hours.  Recent Results (from the past 240 hour(s))  Culture, blood (routine x 2) Call MD if unable to obtain prior to antibiotics being given     Status: None (Preliminary result)   Collection Time: 01/29/15 10:15 AM  Result Value Ref Range Status   Specimen Description BLOOD RIGHT ANTECUBITAL  Final   Special Requests BOTTLES DRAWN AEROBIC AND ANAEROBIC  Final   Culture   Final  BLOOD CULTURE RECEIVED NO GROWTH TO DATE CULTURE WILL BE HELD FOR 5 DAYS BEFORE ISSUING A FINAL NEGATIVE REPORT Performed at [facility]    Report Status PENDING  Incomplete  Culture, blood (routine x 2) Call MD if unable to obtain prior to antibiotics being given     Status: None (Preliminary result)   Collection Time: 01/29/15 10:30 AM  Result Value Ref Range Status   Specimen Description BLOOD LEFT ANTECUBITAL  Final   Special Requests BOTTLES DRAWN AEROBIC AND ANAEROBIC  Final   Culture   Final           BLOOD CULTURE RECEIVED NO GROWTH TO DATE CULTURE WILL BE HELD FOR 5 DAYS BEFORE ISSUING A FINAL NEGATIVE  REPORT Performed at [facility]    Report Status PENDING  Incomplete     Studies: Dg Chest 1 View  01/29/2015   CLINICAL DATA:  Pleural effusion.  Post right thoracentesis.  EXAM: CHEST  1 VIEW  COMPARISON:  01/29/2015  FINDINGS: Cardiomegaly. Mediastinal contours are within normal missed. Small right pleural effusion, decreased since prior study. No pneumothorax following thoracentesis. Right basilar opacity likely reflects atelectasis, also improved.  IMPRESSION: Improving right effusion and right base atelectasis. No pneumothorax following thoracentesis.   Electronically Signed   By: Charlett Nose M.D.   On: 01/29/2015 09:54   Dg Chest Portable 1 View  01/29/2015   CLINICAL DATA:  Cough for 3 weeks, with bilateral lower extremity swelling. Initial encounter.  EXAM: PORTABLE CHEST - 1 VIEW  COMPARISON:  Chest radiograph performed 09/13/2014  FINDINGS: A moderate right-sided pleural effusion is again noted, with right basilar airspace opacification, concerning for either pneumonia or asymmetric pulmonary edema. More mild left basilar airspace opacity is noted. No pneumothorax is seen.  The cardiomediastinal silhouette is borderline enlarged. No acute osseous abnormalities are identified.  IMPRESSION: Moderate right pleural effusion, with right basilar airspace opacification, concerning for either pneumonia or asymmetric pulmonary edema. More mild left basilar airspace opacity noted. Borderline cardiomegaly.   Electronically Signed   By: Roanna Raider M.D.   On: 01/29/2015 03:29   US Thoracentesis Asp Pleural Space W/img Guide  01/29/2015   INDICATION: Symptomatic Rt sided pleural effusion  EXAM: US THORACENTESIS ASP PLEURAL SPACE W/IMG GUIDE  COMPARISON:  None.  MEDICATIONS: 10 cc 1% lidocaine  COMPLICATIONS: None immediate  TECHNIQUE: Informed written consent was obtained from the patient after a discussion of the risks, benefits and alternatives to treatment. A timeout was performed prior  to the initiation of the procedure.  Initial ultrasound scanning demonstrates a Right pleural effusion. The lower chest was prepped and draped in the usual sterile fashion. 1% lidocaine was used for local anesthesia.  Under direct ultrasound guidance, a 19 gauge, 7-cm, Yueh catheter was introduced. An ultrasound image was saved for documentation purposes. the thoracentesis was performed. The catheter was removed and a dressing was applied. The patient tolerated the procedure well without immediate post procedural complication. The patient was escorted to have an upright chest radiograph.  FINDINGS: A total of approximately 1 liters of yellow fluid was removed. Requested samples were sent to the laboratory.  IMPRESSION: Successful ultrasound-guided R sided thoracentesis yielding 1 liters of pleural fluid.  Read by:  Robet Leu [facility]   Electronically Signed   By: Simonne Come M.D.   On: 01/29/2015 11:56    Scheduled Meds: . atorvastatin  40 mg Oral q1800  . azithromycin  500 mg Oral Q24H  .  benzonatate  200 mg Oral TID  . carvedilol  6.25 mg Oral BID WC  . cefTRIAXone (ROCEPHIN)  IV  1 g Intravenous Q24H  . enoxaparin (LOVENOX) injection  40 mg Subcutaneous Q24H  . furosemide  80 mg Intravenous Q12H  . isosorbide-hydrALAZINE  1 tablet Oral BID  . loratadine  10 mg Oral Daily  . losartan  25 mg Oral Daily  . potassium chloride SA  40 mEq Oral Daily  . sodium chloride  3 mL Intravenous Q12H  . spironolactone  25 mg Oral Daily  . ticagrelor  90 mg Oral BID   Continuous Infusions:   Principal Problem:   CAP (community acquired pneumonia) Active Problems:   HLD (hyperlipidemia)   Tobacco abuse   Acute kidney injury   HTN (hypertension)   Cardiomyopathy, ischemic, EF 30-35 % Echo 05/04/14.   Acute systolic CHF (congestive heart failure), NYHA class 3   CAD (coronary artery disease)   Hypokalemia   Pneumonia, community acquired   Systolic CHF, acute on chronic    Time spent: 35 minutes,      Regalado, Belkys A  [facility] Pager [phone]. If 7PM-7AM, please contact night-coverage at [email or web address], password [facility] 01/30/2015, 12:07 PM  LOS: 1 day

## 2015-01-30 NOTE — Progress Notes (Signed)
  Echocardiogram 2D Echocardiogram has been performed.  Deborah Ho 01/30/2015, 1:58 PM

## 2015-01-30 NOTE — Care Management Note (Unsigned)
    Page 1 of 1   02/04/2015     1:39:35 PM CARE MANAGEMENT NOTE 02/04/2015  Patient:  [facility]   Account Number:  [id number]  Date Initiated:  01/30/2015  Documentation initiated by:  Stratton Villwock  Subjective/Objective Assessment:   Pt adm on 01/28/15 with PNA, CHF.  PTA, pt resides at home alone.     Action/Plan:   PT recommending Outpatient PT at dc.  Will follow for dc needs as pt progresses.   Anticipated DC Date:  02/05/2015   Anticipated DC Plan:  HOME/SELF CARE      DC Planning Services  CM consult      Choice offered to / List presented to:             Status of service:  In process, will continue to follow Medicare Important Message given?  NO (If response is "NO", the following Medicare IM given date fields will be blank) Date Medicare IM given:   Medicare IM given by:   Date Additional Medicare IM given:   Additional Medicare IM given by:    Discharge Disposition:  HOME/SELF CARE  Per UR Regulation:  Reviewed for med. necessity/level of care/duration of stay  If discussed at Long Length of Stay Meetings, dates discussed:    Comments:  02/04/15 Sidney Ace, RN, BSN [phone] PT recommending no PT follow up at this time.  Will follow progress.

## 2015-01-31 LAB — URINE CULTURE: Colony Count: >=100000

## 2015-01-31 LAB — BASIC METABOLIC PANEL
Anion gap: 13 (ref 5–15)
BUN: 23 mg/dL (ref 6–23)
CO2: 26 mmol/L (ref 19–32)
Calcium: 9.2 mg/dL (ref 8.4–10.5)
Chloride: 101 mmol/L (ref 96–112)
Creatinine, Ser: 1.39 mg/dL — ABNORMAL HIGH (ref 0.50–1.10)
GFR calc Af Amer: 46 mL/min — ABNORMAL LOW (ref >90)
GFR calc non Af Amer: 39 mL/min — ABNORMAL LOW (ref >90)
Glucose, Bld: 74 mg/dL (ref 70–99)
Potassium: 3.9 mmol/L (ref 3.5–5.1)
Sodium: 140 mmol/L (ref 135–145)

## 2015-01-31 LAB — HIV ANTIBODY (ROUTINE TESTING W REFLEX): HIV Screen 4th Generation wRfx: NONREACTIVE

## 2015-01-31 LAB — URIC ACID: Uric Acid, Serum: 8.6 mg/dL — ABNORMAL HIGH (ref 2.4–7.0)

## 2015-01-31 MED ORDER — CEPHALEXIN 500 MG PO CAPS
500.0000 mg | ORAL_CAPSULE | Freq: Two times a day (BID) | ORAL | Status: AC
  Administered 2015-02-01 (×2): 500 mg via ORAL
  Filled 2015-01-31 (×2): qty 1

## 2015-01-31 MED ORDER — CEFTRIAXONE SODIUM IN DEXTROSE 20 MG/ML IV SOLN
1.0000 g | INTRAVENOUS | Status: DC
  Administered 2015-01-31: 1 g via INTRAVENOUS
  Filled 2015-01-31: qty 50

## 2015-01-31 MED ORDER — COLCHICINE 0.6 MG PO TABS
0.6000 mg | ORAL_TABLET | Freq: Every day | ORAL | Status: DC
  Administered 2015-01-31 – 2015-02-05 (×6): 0.6 mg via ORAL
  Filled 2015-01-31 (×6): qty 1

## 2015-01-31 MED ORDER — FUROSEMIDE 10 MG/ML IJ SOLN
80.0000 mg | Freq: Three times a day (TID) | INTRAMUSCULAR | Status: DC
  Administered 2015-01-31 – 2015-02-02 (×4): 80 mg via INTRAVENOUS
  Filled 2015-01-31 (×6): qty 8

## 2015-01-31 MED ORDER — METOLAZONE 5 MG PO TABS
5.0000 mg | ORAL_TABLET | Freq: Two times a day (BID) | ORAL | Status: DC
  Administered 2015-01-31 – 2015-02-02 (×4): 5 mg via ORAL
  Filled 2015-01-31 (×6): qty 1

## 2015-01-31 MED ORDER — HYDROCODONE-ACETAMINOPHEN 5-325 MG PO TABS
1.0000 | ORAL_TABLET | Freq: Four times a day (QID) | ORAL | Status: DC | PRN
  Administered 2015-01-31 – 2015-02-05 (×10): 1 via ORAL
  Filled 2015-01-31 (×10): qty 1

## 2015-01-31 NOTE — Progress Notes (Addendum)
The patient's vital signs were stable overnight and she stated that she slept well.  She is complaining of pain in her ankle and foot this morning that she says occurs every morning upon waking.  Will alert the day shift nurse of this issue.

## 2015-01-31 NOTE — Progress Notes (Signed)
Pt says her arm cramped up for 5-10 minutes after receiving IV abx.  Pt says her arm is fine now.  MD made aware, will continue to monitor.

## 2015-01-31 NOTE — Progress Notes (Signed)
Advanced Heart Failure Rounding Note   Subjective:    Deborah Ho is a 64 y.o. female with a history of HTN, HLD, tobacco abuse, CAD s/p anterior STEMI with early in-stent thorombosis for missed dose of Brillinta s/p PCI x 2 into LAD (04/2014) and chronic systolic HF with EF 35-40% who is admitted for ADHF and R pleural effusion.   Effusion tapped on 3/15 (transudative)  Diuresing modestly. Weight down 1 more pound. Creatinine up slightly to 1.39. Echo with EF down to 20%  Objective:   Weight Range:  Vital Signs:   Temp:  [97.6 F (36.4 C)-97.8 F (36.6 C)] 97.7 F (36.5 C) (03/17 1358) Pulse Rate:  [83-89] 84 (03/17 1358) Resp:  [17-18] 18 (03/17 1358) BP: (97-130)/(66-86) 97/66 mmHg (03/17 1358) SpO2:  [94 %-100 %] 100 % (03/17 0815) Weight:  [90.13 kg (198 lb 11.2 oz)] 90.13 kg (198 lb 11.2 oz) (03/17 0554) Last BM Date: 01/31/15  Weight change: Filed Weights   01/29/15 1634 01/30/15 0537 01/31/15 0554  Weight: 91.763 kg (202 lb 4.8 oz) 90.447 kg (199 lb 6.4 oz) 90.13 kg (198 lb 11.2 oz)    Intake/Output:   Intake/Output Summary (Last 24 hours) at 01/31/15 1445 Last data filed at 01/31/15 1002  Gross per 24 hour  Intake   1500 ml  Output   3675 ml  Net  -2175 ml     Physical Exam: General: Sitting in chair. No resp difficulty HEENT: normal Neck: supple. JVP jaw. Carotids 2+ bilat; no bruits. No lymphadenopathy or thryomegaly appreciated. Cor: PMI nondisplaced. Regular rate & rhythm. 2/6 MR no s3 Lungs: clear x decreased at R base Abdomen: soft, nontender, nondistended. No hepatosplenomegaly. No bruits or masses. Good bowel sounds. Extremities: no cyanosis, clubbing, rash, 2-3+ edema Neuro: alert & orientedx3, cranial nerves grossly intact. moves all 4 extremities w/o difficulty. Affect pleasant  Telemetry: SR 80s (personally reviewed)  Labs: Basic Metabolic Panel:  Recent Labs Lab 01/28/15 2031 01/30/15 0432 01/31/15 0515  NA 145 142 140  K 3.4*  3.4* 3.9  CL 108 105 101  CO2 25 30 26   GLUCOSE 123* 104* 74  BUN 13 18 23   CREATININE 1.16* 1.34* 1.39*  CALCIUM 9.1 8.9 9.2    Liver Function Tests:  Recent Labs Lab 01/28/15 2031 01/30/15 1828  AST 27 27  ALT 23 20  ALKPHOS 150* 148*  BILITOT 1.1 0.8  PROT 6.6 5.8*  ALBUMIN 3.1* 3.0*    Recent Labs Lab 01/28/15 2031  LIPASE 26   No results for input(s): AMMONIA in the last 168 hours.  CBC:  Recent Labs Lab 01/28/15 2031  WBC 10.3  NEUTROABS 7.1  HGB 14.7  HCT 45.8  MCV 90.0  PLT 411*    Cardiac Enzymes: No results for input(s): CKTOTAL, CKMB, CKMBINDEX, TROPONINI in the last 168 hours.  BNP: BNP (last 3 results) No results for input(s): BNP in the last 8760 hours.  ProBNP (last 3 results)  Recent Labs  08/22/14 1226 09/13/14 1854 11/05/14 1143  PROBNP 3059.0* 12687.0* 321.0*      Other results:  Imaging: No results found.   Medications:     Scheduled Medications: . atorvastatin  40 mg Oral q1800  . benzonatate  200 mg Oral TID  . carvedilol  6.25 mg Oral BID WC  . cefTRIAXone (ROCEPHIN)  IV  1 g Intravenous Q24H  . enoxaparin (LOVENOX) injection  40 mg Subcutaneous Q24H  . furosemide  80 mg Intravenous Q12H  . isosorbide-hydrALAZINE  1 tablet Oral BID  . loratadine  10 mg Oral Daily  . losartan  25 mg Oral Daily  . metolazone  2.5 mg Oral Daily  . potassium chloride SA  40 mEq Oral Daily  . sodium chloride  3 mL Intravenous Q12H  . spironolactone  25 mg Oral Daily  . ticagrelor  90 mg Oral BID    Infusions:    PRN Medications: sodium chloride, acetaminophen, albuterol, HYDROcodone-acetaminophen, nitroGLYCERIN, ondansetron (ZOFRAN) IV, sodium chloride   Assessment:   1. Acute on chronic systolic HF 2. Ischemic CM EF 35% by echo 12/15 -> 20% 01/30/15 3. R pleural effusion  --s/p thoracentesis 01/29/15 (transudative) 4. CAD s/p anterior MI 5. Dietary non compliance 6. Acute respiratory failure, improved 7.  Hypokalemia 8. ASA allergy 9. Tobacco use  Plan/Discussion:     Diuresing modestly. F/u echo shows worsening EF. Still significantly volume overloaded. Increase IV lasix to 80 bid. Increase  Metolazone to 2.5 bid.  Asked her to keep TED hose on. Consider switching lasix to demadex on d/c.   Compliance with dietary restriction will be a major issue. Will need f/u in HF Clinic and Paramedicine. (we have seen her before in HF Clinic but she stopped coming after 12- visits).   Continue Brilinta for CAD.   Length of Stay: 2  Arvilla Meres MD 01/31/2015, 2:45 PM  Advanced Heart Failure Team Pager [phone] (M-F; 7a - 4p)  Please contact [facility] for night-coverage after hours (4p -7a ) and weekends on [email or web address]

## 2015-01-31 NOTE — Progress Notes (Signed)
Pt c/o 10/10 foot pain and says tylenol is not working.  MD notified.

## 2015-01-31 NOTE — Progress Notes (Addendum)
Heart Failure Navigator Consult Note  Presentation:Deborah Ho is a 64 y.o. female with a history of HTN, HLD, tobacco abuse, CAD s/p anterior STEMI with early in-stent thorombosis for missed dose of Brillinta s/p PCI x 2 into LAD (04/2014) and chronic systolic HF with EF 35-40% who is admitted for ADHA and R pleural effusion.   Patient initially presented as anterior STEMI on 05/03/2014. Cardiac catheterization that time showed a 95% proximal LAD stenosis, 30% proximal left circumflex stenosis, 20% mild ramus intermedius stenosis, 70-80% long segment RCA stenosis. Patient's ejection fraction was noted to be 35% that time. She was placed on aspirin and Brilinta and eventually discharged on 05/03/2014. Patient came back to the [facility] hospital the following week on 05/09/2014 with a recurrent anterior MI because she missed one day of her Brilinta. Cardiac catheterization done on 6/24 showed subacute stent thrombosis in mid LAD which was treated with overlapping drug-eluting stent at the distal edge of the previous stent. Her LVEDP was noted at 23 mmHg at the time. Her ejection fraction was noted to be 35% on cardiac catheterization. She has had 3 heart failure admissions in September - December 2015.Marland Kitchen Echo from September showed an EF down to 15%. Review of her chart shows issues with non compliance and very poor insight into her issues. Echo repeated 11/13/14 and showed improved EF to 35-40%. Now reduced back down to 20% per echo done on 01/30/15.  States that over past 10 days has noticed increased leg edema, nonproductive cough and PND despite being compliant with her lasix.    Past Medical History  Diagnosis Date  . GERD (gastroesophageal reflux disease)     Probable  . CAD (coronary artery disease) 05/03/14; 05/09/14    a. anterior STEMI with early in-stent thorombosis for missed dose of Brillinta s/p PCI with DESx 2 into LAD (04/2014)  . HTN (hypertension)   . Hyperlipidemia   . Tobacco use   .  Non compliance w medication regimen   . Ischemic cardiomyopathy     a. 04/2014 ECHO with EF 45-50% b.  Repeat 2D echo 08/14/14 with EF down at 15%. Life vest placed    History   Social History  . Marital Status: Widowed    Spouse Name: N/A  . Number of Children: N/A  . Years of Education: N/A   Occupational History  . not employed    Social History Main Topics  . Smoking status: Current Every Day Smoker -- 0.10 packs/day for .5 years    Types: Cigarettes  . Smokeless tobacco: Current User     Comment: down to 3 cigarettes daily (08/03/14)  . Alcohol Use: No  . Drug Use: No  . Sexual Activity: Not on file   Other Topics Concern  . None   Social History Narrative   Patient has 6 brothers and sisters and none have known coronary artery disease. She lives alone in [place], but has several brothers in the area.    ECHO:Study Conclusions--01/30/15  - Left ventricle: The cavity size was normal. Wall thickness was increased in a pattern of mild LVH. The estimated ejection fraction was 20%. Diffuse hypokinesis. - Mitral valve: There was mild regurgitation. - Left atrium: The atrium was mildly dilated. - Right ventricle: Data suggests some RV dysfunction. Not able to assess RV function due to poor visualization. Not able to assess RV size due to poor visualization. - Right atrium: The atrium was mildly dilated. - Pulmonary arteries: PA peak pressure: 41 mm Hg (  S).  Transthoracic echocardiography. M-mode, complete 2D, spectral Doppler, and color Doppler. Birthdate: Patient birthdate: June 07, 1951. Age: Patient is 64 yr old. Sex: Gender: female. BMI: 33.1 kg/m^2. Blood pressure:   135/87 Patient status: Inpatient. Study date: Study date: 01/30/2015. Study time: 01:10 PM. Location: Bedside.  BNP No results found for: BNP  ProBNP    Component Value Date/Time   PROBNP 321.0* 11/05/2014 1143     Education Assessment and Provision:  Detailed  education and instructions provided on heart failure disease management including the following:  Signs and symptoms of Heart Failure When to call the physician Importance of daily weights Low sodium diet Fluid restriction Medication management Anticipated future follow-up appointments  Patient education given on each of the above topics.  Patient acknowledges understanding and acceptance of all instructions.  I know Ms. Brazee from prior admission.  I spent extensive time reviewing a low sodium diet as well as fluid restriction education during her admission in Sept/Oct.  She tells me that she felt she was "doing well" prior to this admission and was basically eating a "low sodium diet"--however in dietary recall she says she has been eating sausage and other high sodium foods.  She does admit to increasing her fluid restriction after recent improvement in her EF from December.  We again reviewed low sodium diet and high sodium foods to avoid as well as fluid restriction.  She is open to Paramedicine and I will refer her to that program for assistance with teaching in her home as well as symptom recognition.   Education Materials:  "Living Better With Heart Failure" Booklet, Daily Weight Tracker Tool .   High Risk Criteria for Readmission and/or Poor Patient Outcomes:  (Recommend Follow-up with Advanced Heart Failure Clinic)--Yes--she would benefit from AHF Clinic--however was referred in Sept/Oct and only kept 1 appt--then [facility] to [facility].   EF <30%- Yes 20-25% decreased from Dec. echo  2 or more admissions in 6 months- Yes  Difficult social situation- Yes-lives alone-? She may have inflated confidence of her understanding of HF recommendations  Demonstrates medication noncompliance-? Not admitted    Barriers of Care:  Knowledge and compliance  Discharge Planning:   Plans to discharge to home alone.  Will refer her to Paramedicine and [facility] if she  qualifies.

## 2015-01-31 NOTE — Progress Notes (Signed)
[facility] PROGRESS NOTE  Ayan Yankey ATF:[id number] DOB: 11-12-1951 DOA: 01/29/2015 PCP: Burnett Kanaris E, PA-C  Assessment/Plan: Acute Systolic CHF exacerbation -Continue with  IV Lasix 80 mg twice daily.  -ECHO with lower EF at 20 % -Not on aspirin due to allergy, continue Coreg 6.25 mg twice a day ,bidil and Aldactone. -Heart failure team following.  -Weight: 93---91--90 -- -negative 6 L.  -started on losartan.  -Still with significant LE edema.   Right pleura effusion: Transudate.  S/P thoracentesis 3-15. No WBC on pleural fluid.  Legionella and strep negative.  Afebrile, no leukocytosis.  Antibiotics discontinue 3-16.   Left foot pain; check uric acid   Coronary artery disease with STEMI s/p DES Continue Brilinta , BB and statin.   Nausea and vomiting Resolved.   Tobacco abuse: counseled on cessation. patient Plans to quit  hypokalemia Replenish  AKI mild . Monitor on IV lasix  UTI;  Report increase frequency, although she is on lasix.  UA with 100,000 E coli.  Will treat her with 3 days of ceftriaxone.   Diet:cardiac  DVT prophylaxis: sq lovenox  Code Status: Full code.  Family Communication: Care discussed with patient  Disposition Plan: remain inpatient for diuresis   Consultants:  Cardiology  Procedures:  ECHO: Ef 20 %  Antibiotics:  Ceftriaxone  HPI/Subjective: She is feeling better, breathing better,. Cough has  resolved.  omplaining of left foot pain. Better after Vicodin.   Objective: Filed Vitals:   01/31/15 0815  BP: 130/82  Pulse: 87  Temp: 97.7 F (36.5 C)  Resp: 17    Intake/Output Summary (Last 24 hours) at 01/31/15 1301 Last data filed at 01/31/15 1002  Gross per 24 hour  Intake   1260 ml  Output   3775 ml  Net  -2515 ml   Filed Weights   01/29/15 1634 01/30/15 0537 01/31/15 0554  Weight: 91.763 kg (202 lb 4.8 oz) 90.447 kg (199 lb 6.4 oz) 90.13 kg (198 lb 11.2 oz)    Exam:   General:   Alert in no distress.   Cardiovascular: S 1, S 3 RRR  Respiratory: Bilateral air movement, crackles bases.   Abdomen: BS present, distended, no rigidity  Musculoskeletal: plus 2 edema.    Data Reviewed: Basic Metabolic Panel:  Recent Labs Lab 01/28/15 2031 01/30/15 0432 01/31/15 0515  NA 145 142 140  K 3.4* 3.4* 3.9  CL 108 105 101  CO2 25 30 26   GLUCOSE 123* 104* 74  BUN 13 18 23   CREATININE 1.16* 1.34* 1.39*  CALCIUM 9.1 8.9 9.2   Liver Function Tests:  Recent Labs Lab 01/28/15 2031 01/30/15 1828  AST 27 27  ALT 23 20  ALKPHOS 150* 148*  BILITOT 1.1 0.8  PROT 6.6 5.8*  ALBUMIN 3.1* 3.0*    Recent Labs Lab 01/28/15 2031  LIPASE 26   No results for input(s): AMMONIA in the last 168 hours. CBC:  Recent Labs Lab 01/28/15 2031  WBC 10.3  NEUTROABS 7.1  HGB 14.7  HCT 45.8  MCV 90.0  PLT 411*   Cardiac Enzymes: No results for input(s): CKTOTAL, CKMB, CKMBINDEX, TROPONINI in the last 168 hours. BNP (last 3 results) No results for input(s): BNP in the last 8760 hours.  ProBNP (last 3 results)  Recent Labs  08/22/14 1226 09/13/14 1854 11/05/14 1143  PROBNP 3059.0* 12687.0* 321.0*    CBG: No results for input(s): GLUCAP in the last 168 hours.  Recent Results (from the past 240 hour(s))  Culture,  Urine     Status: None (Preliminary result)   Collection Time: 01/29/15  2:24 AM  Result Value Ref Range Status   Specimen Description URINE, CLEAN CATCH  Final   Special Requests ADDED [id number]  Final   Colony Count   Final    >=100,000 COLONIES/ML Performed at [facility]    Culture   Final    ESCHERICHIA COLI Performed at [facility]    Report Status PENDING  Incomplete  Culture, blood (routine x 2) Call MD if unable to obtain prior to antibiotics being given     Status: None (Preliminary result)   Collection Time: 01/29/15 10:15 AM  Result Value Ref Range Status   Specimen Description BLOOD RIGHT ANTECUBITAL  Final    Special Requests BOTTLES DRAWN AEROBIC AND ANAEROBIC  Final   Culture   Final           BLOOD CULTURE RECEIVED NO GROWTH TO DATE CULTURE WILL BE HELD FOR 5 DAYS BEFORE ISSUING A FINAL NEGATIVE REPORT Performed at [facility]    Report Status PENDING  Incomplete  Culture, blood (routine x 2) Call MD if unable to obtain prior to antibiotics being given     Status: None (Preliminary result)   Collection Time: 01/29/15 10:30 AM  Result Value Ref Range Status   Specimen Description BLOOD LEFT ANTECUBITAL  Final   Special Requests BOTTLES DRAWN AEROBIC AND ANAEROBIC  Final   Culture   Final           BLOOD CULTURE RECEIVED NO GROWTH TO DATE CULTURE WILL BE HELD FOR 5 DAYS BEFORE ISSUING A FINAL NEGATIVE REPORT Performed at [facility]    Report Status PENDING  Incomplete     Studies: No results found.  Scheduled Meds: . atorvastatin  40 mg Oral q1800  . benzonatate  200 mg Oral TID  . carvedilol  6.25 mg Oral BID WC  . cefTRIAXone (ROCEPHIN)  IV  1 g Intravenous Q24H  . enoxaparin (LOVENOX) injection  40 mg Subcutaneous Q24H  . furosemide  80 mg Intravenous Q12H  . isosorbide-hydrALAZINE  1 tablet Oral BID  . loratadine  10 mg Oral Daily  . losartan  25 mg Oral Daily  . metolazone  2.5 mg Oral Daily  . potassium chloride SA  40 mEq Oral Daily  . sodium chloride  3 mL Intravenous Q12H  . spironolactone  25 mg Oral Daily  . ticagrelor  90 mg Oral BID   Continuous Infusions:   Principal Problem:   CAP (community acquired pneumonia) Active Problems:   HLD (hyperlipidemia)   Tobacco abuse   Acute kidney injury   HTN (hypertension)   Cardiomyopathy, ischemic, EF 30-35 % Echo 05/04/14.   Acute systolic CHF (congestive heart failure), NYHA class 3   CAD (coronary artery disease)   Hypokalemia   Pneumonia, community acquired   Systolic CHF, acute on chronic   Pleural effusion    Time spent: 35 minutes,     Rosaisela Jamroz A  [facility] Pager [phone]. If 7PM-7AM, please contact night-coverage at [email or web address], password [facility] 01/31/2015, 1:01 PM  LOS: 2 days

## 2015-02-01 DIAGNOSIS — J90 Pleural effusion, not elsewhere classified: Secondary | ICD-10-CM | POA: Insufficient documentation

## 2015-02-01 DIAGNOSIS — J948 Other specified pleural conditions: Secondary | ICD-10-CM

## 2015-02-01 LAB — BASIC METABOLIC PANEL
Anion gap: 12 (ref 5–15)
BUN: 23 mg/dL (ref 6–23)
CO2: 26 mmol/L (ref 19–32)
Calcium: 9 mg/dL (ref 8.4–10.5)
Chloride: 96 mmol/L (ref 96–112)
Creatinine, Ser: 1.35 mg/dL — ABNORMAL HIGH (ref 0.50–1.10)
GFR calc Af Amer: 47 mL/min — ABNORMAL LOW (ref >90)
GFR calc non Af Amer: 41 mL/min — ABNORMAL LOW (ref >90)
Glucose, Bld: 95 mg/dL (ref 70–99)
Potassium: 3.7 mmol/L (ref 3.5–5.1)
Sodium: 134 mmol/L — ABNORMAL LOW (ref 135–145)

## 2015-02-01 LAB — MAGNESIUM: Magnesium: 2.3 mg/dL (ref 1.5–2.5)

## 2015-02-01 LAB — POTASSIUM: Potassium: 5.2 mmol/L — ABNORMAL HIGH (ref 3.5–5.1)

## 2015-02-01 LAB — TSH: TSH: 1.84 u[IU]/mL (ref 0.350–4.500)

## 2015-02-01 MED ORDER — POTASSIUM CHLORIDE CRYS ER 20 MEQ PO TBCR
40.0000 meq | EXTENDED_RELEASE_TABLET | Freq: Two times a day (BID) | ORAL | Status: DC
  Administered 2015-02-01: 40 meq via ORAL
  Filled 2015-02-01: qty 2

## 2015-02-01 NOTE — Progress Notes (Signed)
[facility] PROGRESS NOTE  Deborah Ho EVO:[id number] DOB: 23-Sep-1951 DOA: 01/29/2015 PCP: Burnett Kanaris E, PA-C  Assessment/Plan: Acute Systolic CHF exacerbation -Continue with  IV Lasix 80 mg TID.  -ECHO with lower EF at 20 % -Not on aspirin due to allergy, continue Coreg 6.25 mg twice a day ,bidil and Aldactone. -Heart failure team following.  -Weight: 93---91--90 -- -negative 9 L.  -started on losartan.   Right pleura effusion: Transudate.  S/P thoracentesis 3-15. No WBC on pleural fluid.  Legionella and strep negative.  Afebrile, no leukocytosis.  Antibiotics discontinue 3-16.  Left foot pain; ? Gout,. mildly elevated uric acid. Pain better with colchicine.   Coronary artery disease with STEMI s/p DES Continue Brilinta , BB and statin.   Nausea and vomiting Resolved.   Tobacco abuse: counseled on cessation. patient Plans to quit  hypokalemia Replenish  AKI mild . Monitor on IV lasix  UTI;  Report increase frequency, although she is on lasix.  UA with 100,000 E coli.  Will treat her with 3 days of antibiotics.   Diet:cardiac  DVT prophylaxis: sq lovenox  Code Status: Full code.  Family Communication: Care discussed with patient  Disposition Plan: remain inpatient for diuresis   Consultants:  Cardiology  Procedures:  ECHO: Ef 20 %  Antibiotics:  Keflex.   HPI/Subjective:  foot pain  is better.  Relates some cramps pain in her back  Objective: Filed Vitals:   02/01/15 1028  BP: 104/65  Pulse: 81  Temp:   Resp:     Intake/Output Summary (Last 24 hours) at 02/01/15 1309 Last data filed at 02/01/15 1132  Gross per 24 hour  Intake   1780 ml  Output   5425 ml  Net  -3645 ml   Filed Weights   01/30/15 0537 01/31/15 0554 02/01/15 0500  Weight: 90.447 kg (199 lb 6.4 oz) 90.13 kg (198 lb 11.2 oz) 86.909 kg (191 lb 9.6 oz)    Exam:   General:  Alert in no distress.   Cardiovascular: S 1, S 3 RRR  Respiratory:  Bilateral air movement, crackles bases.   Abdomen: BS present, distended, no rigidity  Musculoskeletal: plus 2 edema.    Data Reviewed: Basic Metabolic Panel:  Recent Labs Lab 01/28/15 2031 01/30/15 0432 01/31/15 0515 02/01/15 0417  NA 145 142 140 134*  K 3.4* 3.4* 3.9 3.7  CL 108 105 101 96  CO2 25 30 26 26   GLUCOSE 123* 104* 74 95  BUN 13 18 23 23   CREATININE 1.16* 1.34* 1.39* 1.35*  CALCIUM 9.1 8.9 9.2 9.0   Liver Function Tests:  Recent Labs Lab 01/28/15 2031 01/30/15 1828  AST 27 27  ALT 23 20  ALKPHOS 150* 148*  BILITOT 1.1 0.8  PROT 6.6 5.8*  ALBUMIN 3.1* 3.0*    Recent Labs Lab 01/28/15 2031  LIPASE 26   No results for input(s): AMMONIA in the last 168 hours. CBC:  Recent Labs Lab 01/28/15 2031  WBC 10.3  NEUTROABS 7.1  HGB 14.7  HCT 45.8  MCV 90.0  PLT 411*   Cardiac Enzymes: No results for input(s): CKTOTAL, CKMB, CKMBINDEX, TROPONINI in the last 168 hours. BNP (last 3 results) No results for input(s): BNP in the last 8760 hours.  ProBNP (last 3 results)  Recent Labs  08/22/14 1226 09/13/14 1854 11/05/14 1143  PROBNP 3059.0* 12687.0* 321.0*    CBG: No results for input(s): GLUCAP in the last 168 hours.  Recent Results (from the past 240 hour(s))  Culture, Urine     Status: None   Collection Time: 01/29/15  2:24 AM  Result Value Ref Range Status   Specimen Description URINE, CLEAN CATCH  Final   Special Requests ADDED [phone] 1135  Final   Colony Count   Final    >=100,000 COLONIES/ML Performed at [facility]    Culture   Final    ESCHERICHIA COLI Performed at [facility]    Report Status 01/31/2015 FINAL  Final   Organism ID, Bacteria ESCHERICHIA COLI  Final      Susceptibility   Escherichia coli - MIC*    AMPICILLIN <=2 SENSITIVE Sensitive     CEFAZOLIN <=4 SENSITIVE Sensitive     CEFTRIAXONE <=1 SENSITIVE Sensitive     CIPROFLOXACIN <=0.25 SENSITIVE Sensitive     GENTAMICIN <=1 SENSITIVE  Sensitive     LEVOFLOXACIN <=0.12 SENSITIVE Sensitive     NITROFURANTOIN <=16 SENSITIVE Sensitive     TOBRAMYCIN <=1 SENSITIVE Sensitive     TRIMETH/SULFA <=20 SENSITIVE Sensitive     PIP/TAZO <=4 SENSITIVE Sensitive     * ESCHERICHIA COLI  Culture, blood (routine x 2) Call MD if unable to obtain prior to antibiotics being given     Status: None (Preliminary result)   Collection Time: 01/29/15 10:15 AM  Result Value Ref Range Status   Specimen Description BLOOD RIGHT ANTECUBITAL  Final   Special Requests BOTTLES DRAWN AEROBIC AND ANAEROBIC  Final   Culture   Final           BLOOD CULTURE RECEIVED NO GROWTH TO DATE CULTURE WILL BE HELD FOR 5 DAYS BEFORE ISSUING A FINAL NEGATIVE REPORT Performed at [facility]    Report Status PENDING  Incomplete  Culture, blood (routine x 2) Call MD if unable to obtain prior to antibiotics being given     Status: None (Preliminary result)   Collection Time: 01/29/15 10:30 AM  Result Value Ref Range Status   Specimen Description BLOOD LEFT ANTECUBITAL  Final   Special Requests BOTTLES DRAWN AEROBIC AND ANAEROBIC  Final   Culture   Final           BLOOD CULTURE RECEIVED NO GROWTH TO DATE CULTURE WILL BE HELD FOR 5 DAYS BEFORE ISSUING A FINAL NEGATIVE REPORT Performed at [facility]    Report Status PENDING  Incomplete     Studies: No results found.  Scheduled Meds: . atorvastatin  40 mg Oral q1800  . benzonatate  200 mg Oral TID  . carvedilol  6.25 mg Oral BID WC  . cephALEXin  500 mg Oral Q12H  . colchicine  0.6 mg Oral Daily  . enoxaparin (LOVENOX) injection  40 mg Subcutaneous Q24H  . furosemide  80 mg Intravenous 3 times per day  . isosorbide-hydrALAZINE  1 tablet Oral BID  . loratadine  10 mg Oral Daily  . losartan  25 mg Oral Daily  . metolazone  5 mg Oral BID  . potassium chloride SA  40 mEq Oral Daily  . sodium chloride  3 mL Intravenous Q12H  . spironolactone  25 mg Oral Daily  . ticagrelor  90 mg  Oral BID   Continuous Infusions:   Principal Problem:   CAP (community acquired pneumonia) Active Problems:   HLD (hyperlipidemia)   Tobacco abuse   Acute kidney injury   HTN (hypertension)   Cardiomyopathy, ischemic, EF 30-35 % Echo 05/04/14.   Acute systolic CHF (congestive heart failure), NYHA class 3  CAD (coronary artery disease)   Hypokalemia   Pneumonia, community acquired   Systolic CHF, acute on chronic   Pleural effusion    Time spent: 35 minutes,     Margaree Sandhu A  [facility] Pager [phone]. If 7PM-7AM, please contact night-coverage at [email or web address], password [facility] 02/01/2015, 1:09 PM  LOS: 3 days

## 2015-02-01 NOTE — Progress Notes (Addendum)
Advanced Heart Failure Rounding Note   Subjective:    Deborah Ho is a 64 y.o. female with a history of HTN, HLD, tobacco abuse, CAD s/p anterior STEMI with early in-stent thorombosis for missed dose of Brillinta s/p PCI x 2 into LAD (04/2014) and chronic systolic HF with EF 35-40% who is admitted for ADHF and R pleural effusion.   Effusion tapped on 3/15 (transudative). Echo with EF down to 20%  Lasix increased to 80 IV bid yesterday. Out 5L overnight. Weight down another 7 pounds. Down total 15 pounds. Renal function stable.   She remains noncompliant with TED hose and won't elevate her legs.    Objective:   Weight Range:  Vital Signs:   Temp:  [97.7 F (36.5 C)-98.3 F (36.8 C)] 98.3 F (36.8 C) (03/18 0500) Pulse Rate:  [81-86] 81 (03/18 1028) Resp:  [18] 18 (03/18 0500) BP: (97-113)/(61-81) 104/65 mmHg (03/18 1028) SpO2:  [97 %-99 %] 99 % (03/18 0500) Weight:  [86.909 kg (191 lb 9.6 oz)] 86.909 kg (191 lb 9.6 oz) (03/18 0500) Last BM Date: 02/01/15  Weight change: Filed Weights   01/30/15 0537 01/31/15 0554 02/01/15 0500  Weight: 90.447 kg (199 lb 6.4 oz) 90.13 kg (198 lb 11.2 oz) 86.909 kg (191 lb 9.6 oz)    Intake/Output:   Intake/Output Summary (Last 24 hours) at 02/01/15 1341 Last data filed at 02/01/15 1327  Gross per 24 hour  Intake   1780 ml  Output   5975 ml  Net  -4195 ml     Physical Exam: General: Sitting in chair. No resp difficulty HEENT: normal Neck: supple. JVP jaw. Carotids 2+ bilat; no bruits. No lymphadenopathy or thryomegaly appreciated. Cor: PMI nondisplaced. Regular rate & rhythm. 2/6 MR no s3 Lungs: clear x decreased at R base Abdomen: soft, nontender, nondistended. No hepatosplenomegaly. No bruits or masses. Good bowel sounds. Extremities: no cyanosis, clubbing, rash, 2-3+ edema Neuro: alert & orientedx3, cranial nerves grossly intact. moves all 4 extremities w/o difficulty. Affect pleasant  Telemetry: SR 80s (personally  reviewed)  Labs: Basic Metabolic Panel:  Recent Labs Lab 01/28/15 2031 01/30/15 0432 01/31/15 0515 02/01/15 0417  NA 145 142 140 134*  K 3.4* 3.4* 3.9 3.7  CL 108 105 101 96  CO2 25 30 26 26   GLUCOSE 123* 104* 74 95  BUN 13 18 23 23   CREATININE 1.16* 1.34* 1.39* 1.35*  CALCIUM 9.1 8.9 9.2 9.0    Liver Function Tests:  Recent Labs Lab 01/28/15 2031 01/30/15 1828  AST 27 27  ALT 23 20  ALKPHOS 150* 148*  BILITOT 1.1 0.8  PROT 6.6 5.8*  ALBUMIN 3.1* 3.0*    Recent Labs Lab 01/28/15 2031  LIPASE 26   No results for input(s): AMMONIA in the last 168 hours.  CBC:  Recent Labs Lab 01/28/15 2031  WBC 10.3  NEUTROABS 7.1  HGB 14.7  HCT 45.8  MCV 90.0  PLT 411*    Cardiac Enzymes: No results for input(s): CKTOTAL, CKMB, CKMBINDEX, TROPONINI in the last 168 hours.  BNP: BNP (last 3 results) No results for input(s): BNP in the last 8760 hours.  ProBNP (last 3 results)  Recent Labs  08/22/14 1226 09/13/14 1854 11/05/14 1143  PROBNP 3059.0* 12687.0* 321.0*      Other results:  Imaging: No results found.   Medications:     Scheduled Medications: . atorvastatin  40 mg Oral q1800  . benzonatate  200 mg Oral TID  . carvedilol  6.25 mg  Oral BID WC  . cephALEXin  500 mg Oral Q12H  . colchicine  0.6 mg Oral Daily  . enoxaparin (LOVENOX) injection  40 mg Subcutaneous Q24H  . furosemide  80 mg Intravenous 3 times per day  . isosorbide-hydrALAZINE  1 tablet Oral BID  . loratadine  10 mg Oral Daily  . losartan  25 mg Oral Daily  . metolazone  5 mg Oral BID  . potassium chloride SA  40 mEq Oral Daily  . sodium chloride  3 mL Intravenous Q12H  . spironolactone  25 mg Oral Daily  . ticagrelor  90 mg Oral BID    Infusions:    PRN Medications: sodium chloride, acetaminophen, albuterol, HYDROcodone-acetaminophen, nitroGLYCERIN, ondansetron (ZOFRAN) IV, sodium chloride   Assessment:   1. Acute on chronic systolic HF 2. Ischemic CM EF 35%  by echo 12/15 -> 20% 01/30/15 3. R pleural effusion  --s/p thoracentesis 01/29/15 (transudative) 4. CAD s/p anterior MI 5. Dietary non compliance 6. Acute respiratory failure, improved 7. Hypokalemia 8. ASA allergy 9. Tobacco use  Plan/Discussion:     Diureses has picked up. F/u echo shows worsening EF. Still significantly volume overloaded. Continue IV lasix  80 bid. Increase and   Metolazone 2.5 bid.  I placed TED hose on her myself. Consider switching lasix to demadex on d/c. Will nee at least several more days IV diuresis.   Compliance with dietary restriction will be a major issue. Will need f/u in HF Clinic and Paramedicine. (we have seen her before in HF Clinic but she stopped coming after 12- visits).   Continue Brilinta for CAD.   Length of Stay: 3  Arvilla Meres MD 02/01/2015, 1:41 PM  Advanced Heart Failure Team Pager [phone] (M-F; 7a - 4p)  Please contact [facility] for night-coverage after hours (4p -7a ) and weekends on [email or web address]

## 2015-02-01 NOTE — Progress Notes (Signed)
Physical Therapy Treatment Patient Details Name: Deborah Ho MRN: [id number] DOB: 1951/10/25 Today's Date: 02-24-15    History of Present Illness 64 yo female with onset of cough and resp failure with thoracentesis on 3/15    PT Comments    Pt doing well with mobility and no further PT needed.  Ready for dc from PT standpoint. Pt c/o bil ankle pain with dorsiflexion. Instructed pt in ankle pumps and standing dorsiflex stretch. Encouraged pt to keep legs elevated.    Follow Up Recommendations  No PT follow up     Equipment Recommendations  None recommended by PT    Recommendations for Other Services       Precautions / Restrictions Precautions Precautions: None    Mobility  Bed Mobility               General bed mobility comments: up when PT entered  Transfers Overall transfer level: Independent                  Ambulation/Gait Ambulation/Gait assistance: Independent Ambulation Distance (Feet): 350 Feet Assistive device: None Gait Pattern/deviations: WFL(Within Functional Limits)   Gait velocity interpretation: at or above normal speed for age/gender     Stairs            Wheelchair Mobility    Modified Rankin (Stroke Patients Only)       Balance     Sitting balance-Leahy Scale: Normal       Standing balance-Leahy Scale: Normal                      Cognition Arousal/Alertness: Awake/alert Behavior During Therapy: WFL for tasks assessed/performed Overall Cognitive Status: Within Functional Limits for tasks assessed                      Exercises      General Comments        Pertinent Vitals/Pain Pain Assessment: Faces Faces Pain Scale: Hurts a little bit Pain Location: ankles with sit to stand    Home Living                      Prior Function            PT Goals (current goals can now be found in the care plan section) Progress towards PT goals: Goals met/education completed,  patient discharged from PT    Frequency       PT Plan Discharge plan needs to be updated    Co-evaluation             End of Session   Activity Tolerance: Patient tolerated treatment well Patient left: in chair;with call bell/phone within reach     Time: 1412-1425 PT Time Calculation (min) (ACUTE ONLY): 13 min  Charges:  $Gait Training: 8-22 mins                    G Codes:      Gregori Abril 02-24-2015, 2:30 PM  [employer] PT [phone]

## 2015-02-01 NOTE — Progress Notes (Signed)
Physical Therapy Discharge Patient Details Name: Deborah Ho MRN: [id number] DOB: Apr 29, 1951 Today's Date: 02/01/2015 Time: 3594-0905 PT Time Calculation (min) (ACUTE ONLY): 13 min  Patient discharged from PT services secondary to goals met and no further PT needs identified.  Please see latest therapy progress note for current level of functioning and progress toward goals.    Progress and discharge plan discussed with patient and/or caregiver: Patient/Caregiver agrees with plan  GP    [facility] PT [place] 02/01/2015, 2:32 PM

## 2015-02-01 NOTE — Progress Notes (Signed)
Pt c/o cramping from her feet all the way up her body, gave PRN Vicodin.  MD made aware.

## 2015-02-01 NOTE — Progress Notes (Signed)
Utilization review complete. Adreanna Fickel RN CCM Case Mgmt phone [phone] 

## 2015-02-02 LAB — BASIC METABOLIC PANEL
Anion gap: 11 (ref 5–15)
BUN: 30 mg/dL — ABNORMAL HIGH (ref 6–23)
CO2: 28 mmol/L (ref 19–32)
Calcium: 9.5 mg/dL (ref 8.4–10.5)
Chloride: 96 mmol/L (ref 96–112)
Creatinine, Ser: 1.43 mg/dL — ABNORMAL HIGH (ref 0.50–1.10)
GFR calc Af Amer: 44 mL/min — ABNORMAL LOW (ref >90)
GFR calc non Af Amer: 38 mL/min — ABNORMAL LOW (ref >90)
Glucose, Bld: 77 mg/dL (ref 70–99)
Potassium: 4.2 mmol/L (ref 3.5–5.1)
Sodium: 135 mmol/L (ref 135–145)

## 2015-02-02 LAB — GLUCOSE, CAPILLARY: Glucose-Capillary: 108 mg/dL — ABNORMAL HIGH (ref 70–99)

## 2015-02-02 MED ORDER — SODIUM CHLORIDE 0.9 % IV BOLUS (SEPSIS)
250.0000 mL | Freq: Once | INTRAVENOUS | Status: AC
  Administered 2015-02-02: 250 mL via INTRAVENOUS

## 2015-02-02 NOTE — Progress Notes (Signed)
Pt called and said she was clammy. Pt found to be diaphoretic, bp 86/54 hr89  cbg 108. Dr Sunnie Nielsen texted

## 2015-02-02 NOTE — Progress Notes (Signed)
Patient ID: Deborah Ho, female   DOB: [birth year], 64 y.o.   MRN: [id number]      Subjective:    Episode this AM of symptomatic low bp's  Objective:   Temp:  [97.9 F (36.6 C)-98 F (36.7 C)] 97.9 F (36.6 C) (03/19 0524) Pulse Rate:  [87-89] 89 (03/19 1100) Resp:  [18] 18 (03/19 0524) BP: (86-123)/(54-71) 101/63 mmHg (03/19 1300) SpO2:  [99 %] 99 % (03/19 0524) Weight:  [190 lb 8.7 oz (86.431 kg)] 190 lb 8.7 oz (86.431 kg) (03/19 0524) Last BM Date: 02/02/15  Filed Weights   01/31/15 0554 02/01/15 0500 02/02/15 0524  Weight: 198 lb 11.2 oz (90.13 kg) 191 lb 9.6 oz (86.909 kg) 190 lb 8.7 oz (86.431 kg)    Intake/Output Summary (Last 24 hours) at 02/02/15 1433 Last data filed at 02/02/15 1325  Gross per 24 hour  Intake    920 ml  Output   2700 ml  Net  -1780 ml    Exam:  General: NAD  Resp: decreased breath sounds bilateral bases  Cardiac: RRR, no m/r/g, no JVD  [id number] soft, NT, ND  MSK: 1-2+ bilateral edema  Neuro: no focal deficits  Psych: appropriate affect  Lab Results:  Basic Metabolic Panel:  Recent Labs Lab 01/31/15 0515 02/01/15 0417 02/01/15 1711 02/02/15 0334  NA 140 134*  --  135  K 3.9 3.7 5.2* 4.2  CL 101 96  --  96  CO2 26 26  --  28  GLUCOSE 74 95  --  77  BUN 23 23  --  30*  CREATININE 1.39* 1.35*  --  1.43*  CALCIUM 9.2 9.0  --  9.5  MG  --   --  2.3  --     Liver Function Tests:  Recent Labs Lab 01/28/15 2031 01/30/15 1828  AST 27 27  ALT 23 20  ALKPHOS 150* 148*  BILITOT 1.1 0.8  PROT 6.6 5.8*  ALBUMIN 3.1* 3.0*    CBC:  Recent Labs Lab 01/28/15 2031  WBC 10.3  HGB 14.7  HCT 45.8  MCV 90.0  PLT 411*    Cardiac Enzymes: No results for input(s): CKTOTAL, CKMB, CKMBINDEX, TROPONINI in the last 168 hours.  BNP:  Recent Labs  08/22/14 1226 09/13/14 1854 11/05/14 1143  PROBNP 3059.0* 12687.0* 321.0*    Coagulation: No results for input(s): INR in the last 168  hours.  ECG:   Medications:   Scheduled Medications: . atorvastatin  40 mg Oral q1800  . benzonatate  200 mg Oral TID  . carvedilol  6.25 mg Oral BID WC  . colchicine  0.6 mg Oral Daily  . enoxaparin (LOVENOX) injection  40 mg Subcutaneous Q24H  . furosemide  80 mg Intravenous 3 times per day  . loratadine  10 mg Oral Daily  . metolazone  5 mg Oral BID  . sodium chloride  3 mL Intravenous Q12H  . spironolactone  25 mg Oral Daily  . ticagrelor  90 mg Oral BID     Infusions:     PRN Medications:  sodium chloride, acetaminophen, albuterol, HYDROcodone-acetaminophen, nitroGLYCERIN, ondansetron (ZOFRAN) IV, sodium chloride     Assessment/Plan     1. Acute on chronic systolic HF/ICM - 01/2015 echo LVEF 20%, diffuse hypokinesis - negative 1.7 liters yesterday, negative 11.8 liters since admission. She is on lasix 80mg  tid along with metolazone 5mg  bid. Cr and BUN trending up. Episode of hypotension this morning that was symptomatic at 86/54, now back  up to 101/63. AM meds were held.  - she got her AM diuretics, will hold off on further dosing today to allow her time to [facility] tertiary fluid into intravascular place, resume diuretics tomorrow.      Dina Rich, M.D., F.A.C.C.

## 2015-02-02 NOTE — Progress Notes (Signed)
[facility] PROGRESS NOTE  Deborah Ho GQQ:[id number] DOB: 15-Dec-1950 DOA: 01/29/2015 PCP: Burnett Kanaris E, PA-C  Assessment/Plan: Acute Systolic CHF exacerbation -holding IV Lasix 80 mg TID due to hypotension -ECHO with lower EF at 20 % -Not on aspirin due to allergy, continue Coreg 6.25 mg twice a day ,bidil and Aldactone. -Heart failure team following.  -Weight: 93---91--90 --86 -negative 11 L.  -hold losartan due to symptomatic hypotension. Hold Bidil.   Hypotension, symptomatic; IV bolus ordered. BP improved without fluids. Hold BP medications.   Right pleura effusion: Transudate.  S/P thoracentesis 3-15. No WBC on pleural fluid.  Legionella and strep negative.  Afebrile, no leukocytosis.  Antibiotics discontinue 3-16.  Left foot pain; ? Gout,. mildly elevated uric acid. Pain better with colchicine.   Coronary artery disease with STEMI s/p DES Continue Brilinta , BB and statin.   Nausea and vomiting Resolved.   Tobacco abuse: counseled on cessation. patient Plans to quit  hypokalemia Replenish  AKI mild . Monitor on IV lasix  UTI;  Report increase frequency, although she is on lasix.  UA with 100,000 E coli.  Will treat her with 3 days of antibiotics.   Diet:cardiac  DVT prophylaxis: sq lovenox  Code Status: Full code.  Family Communication: Care discussed with patient  Disposition Plan: remain inpatient for diuresis   Consultants:  Cardiology  Procedures:  ECHO: Ef 20 %  Antibiotics:  Keflex.   HPI/Subjective:  foot pain  is better.  Cramps leg better/. She had and episode of low BP and was clammy and feeling sick. She is better now.   Objective: Filed Vitals:   02/02/15 1300  BP: 101/63  Pulse:   Temp:   Resp:     Intake/Output Summary (Last 24 hours) at 02/02/15 1521 Last data filed at 02/02/15 1325  Gross per 24 hour  Intake    920 ml  Output   2700 ml  Net  -1780 ml   Filed Weights   01/31/15 0554  02/01/15 0500 02/02/15 0524  Weight: 90.13 kg (198 lb 11.2 oz) 86.909 kg (191 lb 9.6 oz) 86.431 kg (190 lb 8.7 oz)    Exam:   General:  Alert in no distress.   Cardiovascular: S 1, S 3 RRR  Respiratory: Bilateral air movement,  CTA  Abdomen: BS present, distended, no rigidity  Musculoskeletal: decrease edema.   Data Reviewed: Basic Metabolic Panel:  Recent Labs Lab 01/28/15 2031 01/30/15 0432 01/31/15 0515 02/01/15 0417 02/01/15 1711 02/02/15 0334  NA 145 142 140 134*  --  135  K 3.4* 3.4* 3.9 3.7 5.2* 4.2  CL 108 105 101 96  --  96  CO2 25 30 26 26   --  28  GLUCOSE 123* 104* 74 95  --  77  BUN 13 18 23 23   --  30*  CREATININE 1.16* 1.34* 1.39* 1.35*  --  1.43*  CALCIUM 9.1 8.9 9.2 9.0  --  9.5  MG  --   --   --   --  2.3  --    Liver Function Tests:  Recent Labs Lab 01/28/15 2031 01/30/15 1828  AST 27 27  ALT 23 20  ALKPHOS 150* 148*  BILITOT 1.1 0.8  PROT 6.6 5.8*  ALBUMIN 3.1* 3.0*    Recent Labs Lab 01/28/15 2031  LIPASE 26   No results for input(s): AMMONIA in the last 168 hours. CBC:  Recent Labs Lab 01/28/15 2031  WBC 10.3  NEUTROABS 7.1  HGB 14.7  HCT 45.8  MCV 90.0  PLT 411*   Cardiac Enzymes: No results for input(s): CKTOTAL, CKMB, CKMBINDEX, TROPONINI in the last 168 hours. BNP (last 3 results) No results for input(s): BNP in the last 8760 hours.  ProBNP (last 3 results)  Recent Labs  08/22/14 1226 09/13/14 1854 11/05/14 1143  PROBNP 3059.0* 12687.0* 321.0*    CBG:  Recent Labs Lab 02/02/15 1201  GLUCAP 108*    Recent Results (from the past 240 hour(s))  Culture, Urine     Status: None   Collection Time: 01/29/15  2:24 AM  Result Value Ref Range Status   Specimen Description URINE, CLEAN CATCH  Final   Special Requests ADDED [phone]  Final   Colony Count   Final    >=100,000 COLONIES/ML Performed at [facility]    Culture   Final    ESCHERICHIA COLI Performed at [facility]     Report Status 01/31/2015 FINAL  Final   Organism ID, Bacteria ESCHERICHIA COLI  Final      Susceptibility   Escherichia coli - MIC*    AMPICILLIN <=2 SENSITIVE Sensitive     CEFAZOLIN <=4 SENSITIVE Sensitive     CEFTRIAXONE <=1 SENSITIVE Sensitive     CIPROFLOXACIN <=0.25 SENSITIVE Sensitive     GENTAMICIN <=1 SENSITIVE Sensitive     LEVOFLOXACIN <=0.12 SENSITIVE Sensitive     NITROFURANTOIN <=16 SENSITIVE Sensitive     TOBRAMYCIN <=1 SENSITIVE Sensitive     TRIMETH/SULFA <=20 SENSITIVE Sensitive     PIP/TAZO <=4 SENSITIVE Sensitive     * ESCHERICHIA COLI  Culture, blood (routine x 2) Call MD if unable to obtain prior to antibiotics being given     Status: None (Preliminary result)   Collection Time: 01/29/15 10:15 AM  Result Value Ref Range Status   Specimen Description BLOOD RIGHT ANTECUBITAL  Final   Special Requests BOTTLES DRAWN AEROBIC AND ANAEROBIC  Final   Culture   Final           BLOOD CULTURE RECEIVED NO GROWTH TO DATE CULTURE WILL BE HELD FOR 5 DAYS BEFORE ISSUING A FINAL NEGATIVE REPORT Performed at [facility]    Report Status PENDING  Incomplete  Culture, blood (routine x 2) Call MD if unable to obtain prior to antibiotics being given     Status: None (Preliminary result)   Collection Time: 01/29/15 10:30 AM  Result Value Ref Range Status   Specimen Description BLOOD LEFT ANTECUBITAL  Final   Special Requests BOTTLES DRAWN AEROBIC AND ANAEROBIC  Final   Culture   Final           BLOOD CULTURE RECEIVED NO GROWTH TO DATE CULTURE WILL BE HELD FOR 5 DAYS BEFORE ISSUING A FINAL NEGATIVE REPORT Performed at [facility]    Report Status PENDING  Incomplete     Studies: No results found.  Scheduled Meds: . atorvastatin  40 mg Oral q1800  . benzonatate  200 mg Oral TID  . carvedilol  6.25 mg Oral BID WC  . colchicine  0.6 mg Oral Daily  . enoxaparin (LOVENOX) injection  40 mg Subcutaneous Q24H  . loratadine  10 mg Oral Daily  . sodium  chloride  3 mL Intravenous Q12H  . spironolactone  25 mg Oral Daily  . ticagrelor  90 mg Oral BID   Continuous Infusions:   Principal Problem:   CAP (community acquired pneumonia) Active Problems:   HLD (hyperlipidemia)   Tobacco  abuse   Acute kidney injury   HTN (hypertension)   Cardiomyopathy, ischemic, EF 30-35 % Echo 05/04/14.   Acute systolic CHF (congestive heart failure), NYHA class 3   CAD (coronary artery disease)   Hypokalemia   Pneumonia, community acquired   Systolic CHF, acute on chronic   Pleural effusion   Pleural effusion, right    Time spent: 35 minutes,     Uri Turnbough A  [facility] Pager [phone]. If 7PM-7AM, please contact night-coverage at [email or web address], password [email or web address] 02/02/2015, 3:21 PM  LOS: 4 days

## 2015-02-03 DIAGNOSIS — I10 Essential (primary) hypertension: Secondary | ICD-10-CM | POA: Insufficient documentation

## 2015-02-03 DIAGNOSIS — R600 Localized edema: Secondary | ICD-10-CM

## 2015-02-03 LAB — BASIC METABOLIC PANEL
Anion gap: 9 (ref 5–15)
BUN: 29 mg/dL — ABNORMAL HIGH (ref 6–23)
CO2: 30 mmol/L (ref 19–32)
Calcium: 9.5 mg/dL (ref 8.4–10.5)
Chloride: 95 mmol/L — ABNORMAL LOW (ref 96–112)
Creatinine, Ser: 1.48 mg/dL — ABNORMAL HIGH (ref 0.50–1.10)
GFR calc Af Amer: 42 mL/min — ABNORMAL LOW (ref >90)
GFR calc non Af Amer: 37 mL/min — ABNORMAL LOW (ref >90)
Glucose, Bld: 87 mg/dL (ref 70–99)
Potassium: 4.3 mmol/L (ref 3.5–5.1)
Sodium: 134 mmol/L — ABNORMAL LOW (ref 135–145)

## 2015-02-03 MED ORDER — FLUCONAZOLE 100 MG PO TABS: 100.0000 mg | ORAL_TABLET | Freq: Every day | ORAL | Status: DC

## 2015-02-03 MED ORDER — LOSARTAN POTASSIUM 25 MG PO TABS
25.0000 mg | ORAL_TABLET | Freq: Every day | ORAL | Status: DC
  Administered 2015-02-03: 25 mg via ORAL
  Filled 2015-02-03 (×2): qty 1

## 2015-02-03 MED ORDER — FLUCONAZOLE 100 MG PO TABS
100.0000 mg | ORAL_TABLET | Freq: Every day | ORAL | Status: DC
  Administered 2015-02-03 – 2015-02-04 (×2): 100 mg via ORAL
  Filled 2015-02-03 (×3): qty 1

## 2015-02-03 MED ORDER — TORSEMIDE 20 MG PO TABS
20.0000 mg | ORAL_TABLET | Freq: Two times a day (BID) | ORAL | Status: DC
  Administered 2015-02-03 (×2): 20 mg via ORAL
  Filled 2015-02-03 (×5): qty 1

## 2015-02-03 NOTE — Progress Notes (Signed)
[facility] PROGRESS NOTE  Deborah Ho WCB:[id number] DOB: [birth year] DOA: 01/29/2015 PCP: Burnett Kanaris E, PA-C  Assessment/Plan: Acute Systolic CHF exacerbation -holding IV Lasix 80 mg TID due to hypotension -ECHO with lower EF at 20 % -Not on aspirin due to allergy, continue Coreg 6.25 mg twice a day ,bidil and Aldactone. -Heart failure team following.  -Weight: 93---91--90 --86 -negative 11 L.  -torsemide and cozaar resume. Monitor overnight for hypotension.   Hypotension, resolved after holding BP medications. Monitor on cozaar and torsemide.   Right pleura effusion: Transudate.  S/P thoracentesis 3-15. No WBC on pleural fluid.  Legionella and strep negative.  Afebrile, no leukocytosis.  Antibiotics discontinue 3-16.  Left foot pain; ? Gout,. mildly elevated uric acid. Pain better with colchicine.   Coronary artery disease with STEMI s/p DES Continue Brilinta , BB and statin.   Nausea and vomiting Resolved.   Tobacco abuse: counseled on cessation. patient Plans to quit  hypokalemia Replenish  AKI mild . Monitor on IV lasix  UTI;  Report increase frequency, although she is on lasix.  UA with 100,000 E coli.  Will treat her with 3 days of antibiotics.  Will give 2 doses of diflucan for yeast infection.   Diet:cardiac  DVT prophylaxis: sq lovenox  Code Status: Full code.  Family Communication: Care discussed with patient  Disposition Plan: home 3-21 if BP remain stable.   Consultants:  Cardiology  Procedures:  ECHO: Ef 20 %  Antibiotics:  Keflex.   HPI/Subjective: Feels better today. No cramps. No dyspnea.   Objective: Filed Vitals:   02/03/15 1115  BP: 118/76  Pulse: 90  Temp:   Resp: 18    Intake/Output Summary (Last 24 hours) at 02/03/15 1400 Last data filed at 02/03/15 0913  Gross per 24 hour  Intake   1060 ml  Output   1550 ml  Net   -490 ml   Filed Weights   02/01/15 0500 02/02/15 0524 02/03/15 0547   Weight: 86.909 kg (191 lb 9.6 oz) 86.431 kg (190 lb 8.7 oz) 84.142 kg (185 lb 8 oz)    Exam:   General:  Alert in no distress.   Cardiovascular: S 1, S 3 RRR  Respiratory: Bilateral air movement,  CTA  Abdomen: BS present, distended, no rigidity  Musculoskeletal: decrease edema.   Data Reviewed: Basic Metabolic Panel:  Recent Labs Lab 01/30/15 0432 01/31/15 0515 02/01/15 0417 02/01/15 1711 02/02/15 0334 02/03/15 0555  NA 142 140 134*  --  135 134*  K 3.4* 3.9 3.7 5.2* 4.2 4.3  CL 105 101 96  --  96 95*  CO2 30 26 26   --  28 30  GLUCOSE 104* 74 95  --  77 87  BUN 18 23 23   --  30* 29*  CREATININE 1.34* 1.39* 1.35*  --  1.43* 1.48*  CALCIUM 8.9 9.2 9.0  --  9.5 9.5  MG  --   --   --  2.3  --   --    Liver Function Tests:  Recent Labs Lab 01/28/15 2031 01/30/15 1828  AST 27 27  ALT 23 20  ALKPHOS 150* 148*  BILITOT 1.1 0.8  PROT 6.6 5.8*  ALBUMIN 3.1* 3.0*    Recent Labs Lab 01/28/15 2031  LIPASE 26   No results for input(s): AMMONIA in the last 168 hours. CBC:  Recent Labs Lab 01/28/15 2031  WBC 10.3  NEUTROABS 7.1  HGB 14.7  HCT 45.8  MCV 90.0  PLT 411*  Cardiac Enzymes: No results for input(s): CKTOTAL, CKMB, CKMBINDEX, TROPONINI in the last 168 hours. BNP (last 3 results) No results for input(s): BNP in the last 8760 hours.  ProBNP (last 3 results)  Recent Labs  08/22/14 1226 09/13/14 1854 11/05/14 1143  PROBNP 3059.0* 12687.0* 321.0*    CBG:  Recent Labs Lab 02/02/15 1201  GLUCAP 108*    Recent Results (from the past 240 hour(s))  Culture, Urine     Status: None   Collection Time: 01/29/15  2:24 AM  Result Value Ref Range Status   Specimen Description URINE, CLEAN CATCH  Final   Special Requests ADDED [phone]  Final   Colony Count   Final    >=100,000 COLONIES/ML Performed at [facility]    Culture   Final    ESCHERICHIA COLI Performed at [facility]    Report Status 01/31/2015 FINAL   Final   Organism ID, Bacteria ESCHERICHIA COLI  Final      Susceptibility   Escherichia coli - MIC*    AMPICILLIN <=2 SENSITIVE Sensitive     CEFAZOLIN <=4 SENSITIVE Sensitive     CEFTRIAXONE <=1 SENSITIVE Sensitive     CIPROFLOXACIN <=0.25 SENSITIVE Sensitive     GENTAMICIN <=1 SENSITIVE Sensitive     LEVOFLOXACIN <=0.12 SENSITIVE Sensitive     NITROFURANTOIN <=16 SENSITIVE Sensitive     TOBRAMYCIN <=1 SENSITIVE Sensitive     TRIMETH/SULFA <=20 SENSITIVE Sensitive     PIP/TAZO <=4 SENSITIVE Sensitive     * ESCHERICHIA COLI  Culture, blood (routine x 2) Call MD if unable to obtain prior to antibiotics being given     Status: None (Preliminary result)   Collection Time: 01/29/15 10:15 AM  Result Value Ref Range Status   Specimen Description BLOOD RIGHT ANTECUBITAL  Final   Special Requests BOTTLES DRAWN AEROBIC AND ANAEROBIC  Final   Culture   Final           BLOOD CULTURE RECEIVED NO GROWTH TO DATE CULTURE WILL BE HELD FOR 5 DAYS BEFORE ISSUING A FINAL NEGATIVE REPORT Performed at [facility]    Report Status PENDING  Incomplete  Culture, blood (routine x 2) Call MD if unable to obtain prior to antibiotics being given     Status: None (Preliminary result)   Collection Time: 01/29/15 10:30 AM  Result Value Ref Range Status   Specimen Description BLOOD LEFT ANTECUBITAL  Final   Special Requests BOTTLES DRAWN AEROBIC AND ANAEROBIC  Final   Culture   Final           BLOOD CULTURE RECEIVED NO GROWTH TO DATE CULTURE WILL BE HELD FOR 5 DAYS BEFORE ISSUING A FINAL NEGATIVE REPORT Performed at [facility]    Report Status PENDING  Incomplete     Studies: No results found.  Scheduled Meds: . atorvastatin  40 mg Oral q1800  . benzonatate  200 mg Oral TID  . carvedilol  6.25 mg Oral BID WC  . colchicine  0.6 mg Oral Daily  . enoxaparin (LOVENOX) injection  40 mg Subcutaneous Q24H  . fluconazole  100 mg Oral Daily  . loratadine  10 mg Oral Daily  .  losartan  25 mg Oral Daily  . sodium chloride  3 mL Intravenous Q12H  . spironolactone  25 mg Oral Daily  . ticagrelor  90 mg Oral BID  . torsemide  20 mg Oral BID   Continuous Infusions:   Principal Problem:  CAP (community acquired pneumonia) Active Problems:   HLD (hyperlipidemia)   Tobacco abuse   Acute kidney injury   HTN (hypertension)   Cardiomyopathy, ischemic, EF 30-35 % Echo 05/04/14.   Acute systolic CHF (congestive heart failure), NYHA class 3   CAD (coronary artery disease)   Hypokalemia   Pneumonia, community acquired   Systolic CHF, acute on chronic   Pleural effusion   Pleural effusion, right    Time spent: 35 minutes,     Liyanna Cartwright A  [facility] Pager [phone]. If 7PM-7AM, please contact night-coverage at [email or web address], password [facility] 02/03/2015, 2:00 PM  LOS: 5 days

## 2015-02-03 NOTE — Progress Notes (Signed)
Patient ID: Deborah Ho, female   DOB: 1950-11-21, 64 y.o.   MRN: [id number]     Subjective:    SOB has improved. No lightheadness or dizziness.   Objective:   Temp:  [97.9 F (36.6 C)-98.2 F (36.8 C)] 98.2 F (36.8 C) (03/20 0523) Pulse Rate:  [88-92] 92 (03/20 0523) Resp:  [18] 18 (03/20 0523) BP: (86-136)/(54-92) 136/92 mmHg (03/20 0523) SpO2:  [95 %-96 %] 95 % (03/20 0523) Weight:  [185 lb 8 oz (84.142 kg)] 185 lb 8 oz (84.142 kg) (03/20 0547) Last BM Date: 02/02/15  Filed Weights   02/01/15 0500 02/02/15 0524 02/03/15 0547  Weight: 191 lb 9.6 oz (86.909 kg) 190 lb 8.7 oz (86.431 kg) 185 lb 8 oz (84.142 kg)    Intake/Output Summary (Last 24 hours) at 02/03/15 1025 Last data filed at 02/03/15 0913  Gross per 24 hour  Intake   1350 ml  Output   2150 ml  Net   -800 ml     Exam:  General: NAD  Resp: CTAB  Cardiac: RRR, no m/r/g, NO JVD  LA:GTXMIWO soft, NT, ND  MSK: 1+ bilateral edema  Neuro: no focal deficits    Lab Results:  Basic Metabolic Panel:  Recent Labs Lab 02/01/15 0417 02/01/15 1711 02/02/15 0334 02/03/15 0555  NA 134*  --  135 134*  K 3.7 5.2* 4.2 4.3  CL 96  --  96 95*  CO2 26  --  28 30  GLUCOSE 95  --  77 87  BUN 23  --  30* 29*  CREATININE 1.35*  --  1.43* 1.48*  CALCIUM 9.0  --  9.5 9.5  MG  --  2.3  --   --     Liver Function Tests:  Recent Labs Lab 01/28/15 2031 01/30/15 1828  AST 27 27  ALT 23 20  ALKPHOS 150* 148*  BILITOT 1.1 0.8  PROT 6.6 5.8*  ALBUMIN 3.1* 3.0*    CBC:  Recent Labs Lab 01/28/15 2031  WBC 10.3  HGB 14.7  HCT 45.8  MCV 90.0  PLT 411*    Cardiac Enzymes: No results for input(s): CKTOTAL, CKMB, CKMBINDEX, TROPONINI in the last 168 hours.  BNP:  Recent Labs  08/22/14 1226 09/13/14 1854 11/05/14 1143  PROBNP 3059.0* 12687.0* 321.0*    Coagulation: No results for input(s): INR in the last 168 hours.  ECG:   Medications:   Scheduled Medications: . atorvastatin  40  mg Oral q1800  . benzonatate  200 mg Oral TID  . carvedilol  6.25 mg Oral BID WC  . colchicine  0.6 mg Oral Daily  . enoxaparin (LOVENOX) injection  40 mg Subcutaneous Q24H  . loratadine  10 mg Oral Daily  . sodium chloride  3 mL Intravenous Q12H  . spironolactone  25 mg Oral Daily  . ticagrelor  90 mg Oral BID     Infusions:     PRN Medications:  sodium chloride, acetaminophen, albuterol, HYDROcodone-acetaminophen, nitroGLYCERIN, ondansetron (ZOFRAN) IV, sodium chloride     Assessment/Plan    1. Acute on chronic systolic HF/ICM - 01/2015 echo LVEF 20%, diffuse hypokinesis - negative 930 mL  yesterday, negative 11.8 liters since admission. Diuretics held yesterday after episode of symptomatic hypotension with SBP in 80s. She had been on lasix 80mg  tid and metolazone 5mg  bid. Mild uptrend in Cr and BUN, baseline Cr appears to be 1 to 1.2. - CXR 01/29/15 with improving effusion - will restart her losartan 25 mg daily,  continue to hold off on hydral/nitrates, can consider restarting as outpatient if bp remains stable. Start torsemide 20mg  bid.  - potential d/c today pending how she does with walk, if she goes would need f/u in CHF clinic in 1 week and a BMET.          , M.D.

## 2015-02-04 LAB — BASIC METABOLIC PANEL
Anion gap: 11 (ref 5–15)
BUN: 36 mg/dL — ABNORMAL HIGH (ref 6–23)
CO2: 29 mmol/L (ref 19–32)
Calcium: 9.7 mg/dL (ref 8.4–10.5)
Chloride: 95 mmol/L — ABNORMAL LOW (ref 96–112)
Creatinine, Ser: 1.9 mg/dL — ABNORMAL HIGH (ref 0.50–1.10)
GFR calc Af Amer: 31 mL/min — ABNORMAL LOW (ref >90)
GFR calc non Af Amer: 27 mL/min — ABNORMAL LOW (ref >90)
Glucose, Bld: 102 mg/dL — ABNORMAL HIGH (ref 70–99)
Potassium: 4.4 mmol/L (ref 3.5–5.1)
Sodium: 135 mmol/L (ref 135–145)

## 2015-02-04 LAB — CULTURE, BLOOD (ROUTINE X 2)
Culture: NO GROWTH
Culture: NO GROWTH

## 2015-02-04 NOTE — Progress Notes (Signed)
Advanced Heart Failure Rounding Note   Subjective:    Deborah Ho is a 64 y.o. female with a history of HTN, HLD, tobacco abuse, CAD s/p anterior STEMI with early in-stent thorombosis for missed dose of Brillinta s/p PCI x 2 into LAD (04/2014) and chronic systolic HF with EF 35-40% who is admitted for ADHF and R pleural effusion.   Effusion tapped on 3/15 (transudative). Echo with EF down to 20%  Overall she has diuresed 24 pounds. Yesterday torsemide was started and losartan but stopped this am.   Denies SOB.   Creatinine 1.4>1.9   Objective:   Weight Range:  Vital Signs:   Temp:  [97.4 F (36.3 C)-97.9 F (36.6 C)] 97.7 F (36.5 C) (03/21 0602) Pulse Rate:  [86-90] 90 (03/21 0602) Resp:  [16-18] 18 (03/21 0602) BP: (111-121)/(66-76) 121/75 mmHg (03/21 0602) SpO2:  [93 %-98 %] 93 % (03/21 0602) Weight:  [182 lb 12.8 oz (82.918 kg)] 182 lb 12.8 oz (82.918 kg) (03/21 0602) Last BM Date: 02/03/15  Weight change: Filed Weights   02/02/15 0524 02/03/15 0547 02/04/15 0602  Weight: 190 lb 8.7 oz (86.431 kg) 185 lb 8 oz (84.142 kg) 182 lb 12.8 oz (82.918 kg)    Intake/Output:   Intake/Output Summary (Last 24 hours) at 02/04/15 0858 Last data filed at 02/04/15 0604  Gross per 24 hour  Intake   1090 ml  Output   2650 ml  Net  -1560 ml     Physical Exam: General: Sitting in chair. No resp difficulty HEENT: normal Neck: supple. JVP flat. Carotids 2+ bilat; no bruits. No lymphadenopathy or thryomegaly appreciated. Cor: PMI nondisplaced. Regular rate & rhythm. 2/6 MR no s3 Lungs: clear x decreased at R base Abdomen: soft, nontender, nondistended. No hepatosplenomegaly. No bruits or masses. Good bowel sounds. Extremities: no cyanosis, clubbing, rash, trace  edema Neuro: alert & orientedx3, cranial nerves grossly intact. moves all 4 extremities w/o difficulty. Affect pleasant  Telemetry: SR 80s (personally reviewed)  Labs: Basic Metabolic Panel:  Recent Labs Lab  01/31/15 0515 02/01/15 0417 02/01/15 1711 02/02/15 0334 02/03/15 0555 02/04/15 0413  NA 140 134*  --  135 134* 135  K 3.9 3.7 5.2* 4.2 4.3 4.4  CL 101 96  --  96 95* 95*  CO2 26 26  --  28 30 29   GLUCOSE 74 95  --  77 87 102*  BUN 23 23  --  30* 29* 36*  CREATININE 1.39* 1.35*  --  1.43* 1.48* 1.90*  CALCIUM 9.2 9.0  --  9.5 9.5 9.7  MG  --   --  2.3  --   --   --     Liver Function Tests:  Recent Labs Lab 01/28/15 2031 01/30/15 1828  AST 27 27  ALT 23 20  ALKPHOS 150* 148*  BILITOT 1.1 0.8  PROT 6.6 5.8*  ALBUMIN 3.1* 3.0*    Recent Labs Lab 01/28/15 2031  LIPASE 26   No results for input(s): AMMONIA in the last 168 hours.  CBC:  Recent Labs Lab 01/28/15 2031  WBC 10.3  NEUTROABS 7.1  HGB 14.7  HCT 45.8  MCV 90.0  PLT 411*    Cardiac Enzymes: No results for input(s): CKTOTAL, CKMB, CKMBINDEX, TROPONINI in the last 168 hours.  BNP: BNP (last 3 results) No results for input(s): BNP in the last 8760 hours.  ProBNP (last 3 results)  Recent Labs  08/22/14 1226 09/13/14 1854 11/05/14 1143  PROBNP 3059.0* 12687.0* 321.0*  Other results:  Imaging: No results found.   Medications:     Scheduled Medications: . atorvastatin  40 mg Oral q1800  . benzonatate  200 mg Oral TID  . carvedilol  6.25 mg Oral BID WC  . colchicine  0.6 mg Oral Daily  . enoxaparin (LOVENOX) injection  40 mg Subcutaneous Q24H  . fluconazole  100 mg Oral Daily  . loratadine  10 mg Oral Daily  . sodium chloride  3 mL Intravenous Q12H  . ticagrelor  90 mg Oral BID    Infusions:    PRN Medications: sodium chloride, acetaminophen, albuterol, HYDROcodone-acetaminophen, nitroGLYCERIN, ondansetron (ZOFRAN) IV, sodium chloride   Assessment:   1. Acute on chronic systolic HF 2. Ischemic CM EF 35% by echo 12/15 -> 20% 01/30/15 3. R pleural effusion  --s/p thoracentesis 01/29/15 (transudative) 4. CAD s/p anterior MI 5. Dietary non compliance 6. Acute  respiratory failure, improved 7. Hypokalemia 8. ASA allergy 9. Tobacco use  Plan/Discussion:   Renal function up today. Stop spiro, losartan, and torsemide. Check BMET in am. Overall weight down 24 pounds.   Continue Brilinta for CAD.   Consult cardiac rehab.   Likely home tomorrow.  Length of Stay: 6  CLEGG,AMY NP-C  02/04/2015, 8:58 AM  Advanced Heart Failure Team Pager [phone] (M-F; 7a - 4p)  Please contact [facility] for night-coverage after hours (4p -7a ) and weekends on [email or web address]  Patient seen and examined with Tonye Becket, NP. We discussed all aspects of the encounter. I agree with the assessment and plan as stated above.   She is dry. Renal function worse. Agree with holding diuretics. Hopefully can restart in am and get her home. She is at high risk for readmission.   Truman Hayward 6:22 PM

## 2015-02-04 NOTE — Progress Notes (Signed)
[facility] PROGRESS NOTE  Deborah Ho TDD:[id number] DOB: 09-Feb-1951 DOA: 01/29/2015 PCP: Burnett Kanaris E, PA-C  Assessment/Plan: Acute Systolic CHF exacerbation -holding IV Lasix 80 mg TID due to hypotension -ECHO with lower EF at 20 % -Not on aspirin due to allergy, continue Coreg 6.25 mg twice a day ,bidil and Aldactone. -Heart failure team following.  -Weight: 93---91--90 --86 -negative 13 L.  Hold diuretics due to increase cr. Hold cozaar.   AKI Worsening renal function. 1.4 to 1.9. Hold diuretics. Repeat labs in am,\  Hypotension, resolved after holding BP medications.   Right pleura effusion: Transudate.  S/P thoracentesis 3-15. No WBC on pleural fluid.  Legionella and strep negative.  Afebrile, no leukocytosis.  Antibiotics discontinue 3-16.  Left foot pain; ? Gout,. mildly elevated uric acid. Pain better with colchicine.   Coronary artery disease with STEMI s/p DES Continue Brilinta , BB and statin.   Nausea and vomiting Resolved.   Tobacco abuse: counseled on cessation. patient Plans to quit  hypokalemia Replenish   UTI;  Report increase frequency, although she is on lasix.  UA with 100,000 E coli.  Will treat her with 3 days of antibiotics.  Will give 2 doses of diflucan for yeast infection.   Diet:cardiac  DVT prophylaxis: sq lovenox  Code Status: Full code.  Family Communication: Care discussed with patient  Disposition Plan: home 3-21 if BP remain stable.   Consultants:  Cardiology  Procedures:  ECHO: Ef 20 %  Antibiotics:  Keflex.   HPI/Subjective: Feels better today. No cramps. No dyspnea.   Objective: Filed Vitals:   02/04/15 0602  BP: 121/75  Pulse: 90  Temp: 97.7 F (36.5 C)  Resp: 18    Intake/Output Summary (Last 24 hours) at 02/04/15 1325 Last data filed at 02/04/15 1246  Gross per 24 hour  Intake   1060 ml  Output   2950 ml  Net  -1890 ml   Filed Weights   02/02/15 0524 02/03/15 0547  02/04/15 0602  Weight: 86.431 kg (190 lb 8.7 oz) 84.142 kg (185 lb 8 oz) 82.918 kg (182 lb 12.8 oz)    Exam:   General:  Alert in no distress.   Cardiovascular: S 1, S 3 RRR  Respiratory: Bilateral air movement,  CTA  Abdomen: BS present, distended, no rigidity  Musculoskeletal: decrease edema.   Data Reviewed: Basic Metabolic Panel:  Recent Labs Lab 01/31/15 0515 02/01/15 0417 02/01/15 1711 02/02/15 0334 02/03/15 0555 02/04/15 0413  NA 140 134*  --  135 134* 135  K 3.9 3.7 5.2* 4.2 4.3 4.4  CL 101 96  --  96 95* 95*  CO2 26 26  --  28 30 29   GLUCOSE 74 95  --  77 87 102*  BUN 23 23  --  30* 29* 36*  CREATININE 1.39* 1.35*  --  1.43* 1.48* 1.90*  CALCIUM 9.2 9.0  --  9.5 9.5 9.7  MG  --   --  2.3  --   --   --    Liver Function Tests:  Recent Labs Lab 01/28/15 2031 01/30/15 1828  AST 27 27  ALT 23 20  ALKPHOS 150* 148*  BILITOT 1.1 0.8  PROT 6.6 5.8*  ALBUMIN 3.1* 3.0*    Recent Labs Lab 01/28/15 2031  LIPASE 26   No results for input(s): AMMONIA in the last 168 hours. CBC:  Recent Labs Lab 01/28/15 2031  WBC 10.3  NEUTROABS 7.1  HGB 14.7  HCT 45.8  MCV  90.0  PLT 411*   Cardiac Enzymes: No results for input(s): CKTOTAL, CKMB, CKMBINDEX, TROPONINI in the last 168 hours. BNP (last 3 results) No results for input(s): BNP in the last 8760 hours.  ProBNP (last 3 results)  Recent Labs  08/22/14 1226 09/13/14 1854 11/05/14 1143  PROBNP 3059.0* 12687.0* 321.0*    CBG:  Recent Labs Lab 02/02/15 1201  GLUCAP 108*    Recent Results (from the past 240 hour(s))  Culture, Urine     Status: None   Collection Time: 01/29/15  2:24 AM  Result Value Ref Range Status   Specimen Description URINE, CLEAN CATCH  Final   Special Requests ADDED [phone] 1135  Final   Colony Count   Final    >=100,000 COLONIES/ML Performed at [facility]    Culture   Final    ESCHERICHIA COLI Performed at [facility]    Report Status  01/31/2015 FINAL  Final   Organism ID, Bacteria ESCHERICHIA COLI  Final      Susceptibility   Escherichia coli - MIC*    AMPICILLIN <=2 SENSITIVE Sensitive     CEFAZOLIN <=4 SENSITIVE Sensitive     CEFTRIAXONE <=1 SENSITIVE Sensitive     CIPROFLOXACIN <=0.25 SENSITIVE Sensitive     GENTAMICIN <=1 SENSITIVE Sensitive     LEVOFLOXACIN <=0.12 SENSITIVE Sensitive     NITROFURANTOIN <=16 SENSITIVE Sensitive     TOBRAMYCIN <=1 SENSITIVE Sensitive     TRIMETH/SULFA <=20 SENSITIVE Sensitive     PIP/TAZO <=4 SENSITIVE Sensitive     * ESCHERICHIA COLI  Culture, blood (routine x 2) Call MD if unable to obtain prior to antibiotics being given     Status: None   Collection Time: 01/29/15 10:15 AM  Result Value Ref Range Status   Specimen Description BLOOD RIGHT ANTECUBITAL  Final   Special Requests BOTTLES DRAWN AEROBIC AND ANAEROBIC  Final   Culture   Final    NO GROWTH 5 DAYS Performed at [facility]    Report Status 02/04/2015 FINAL  Final  Culture, blood (routine x 2) Call MD if unable to obtain prior to antibiotics being given     Status: None   Collection Time: 01/29/15 10:30 AM  Result Value Ref Range Status   Specimen Description BLOOD LEFT ANTECUBITAL  Final   Special Requests BOTTLES DRAWN AEROBIC AND ANAEROBIC  Final   Culture   Final    NO GROWTH 5 DAYS Performed at [facility]    Report Status 02/04/2015 FINAL  Final     Studies: No results found.  Scheduled Meds: . atorvastatin  40 mg Oral q1800  . benzonatate  200 mg Oral TID  . carvedilol  6.25 mg Oral BID WC  . colchicine  0.6 mg Oral Daily  . enoxaparin (LOVENOX) injection  40 mg Subcutaneous Q24H  . fluconazole  100 mg Oral Daily  . loratadine  10 mg Oral Daily  . sodium chloride  3 mL Intravenous Q12H  . ticagrelor  90 mg Oral BID   Continuous Infusions:   Principal Problem:   CAP (community acquired pneumonia) Active Problems:   HLD (hyperlipidemia)   Tobacco abuse    Acute kidney injury   HTN (hypertension)   Cardiomyopathy, ischemic, EF 30-35 % Echo 05/04/14.   Acute systolic CHF (congestive heart failure), NYHA class 3   CAD (coronary artery disease)   Hypokalemia   Pneumonia, community acquired   Systolic CHF, acute on chronic  Pleural effusion   Pleural effusion, right   Bilateral leg edema   Essential hypertension    Time spent: 35 minutes,     Regalado, Belkys A  [facility] Pager [phone]. If 7PM-7AM, please contact night-coverage at [email or web address], password [facility] 02/04/2015, 1:25 PM  LOS: 6 days

## 2015-02-04 NOTE — Progress Notes (Signed)
CARDIAC REHAB PHASE I   PRE:  Rate/Rhythm: 88 SR  BP:  Supine:   Sitting: 118/72  Standing:    SaO2: 99 RA  MODE:  Ambulation: 760 ft   POST:  Rate/Rhythm: 98  BP:  Supine:   Sitting: 128/91  Standing:    SaO2: 99 RA 1340-1425 Pt tolerated ambulation well without c/o of pain or SOB. VS stable. Gait steady. Pt was able to walk 760 feet. Completed CHF education with pt. We discussed CHF zones, daily weights, sodium and fluid restriction, when to call 911 and MD. Pt plans to walk at home. I discussed Outpt. CRP with pt, sounds as though she has had a previous consult with them. Pt must to have had a co-pay that she was not going to be able to afford. She states that she was planning on changing her plan. We will follow up with pt at discharge. I discussed smoking cessation with pt. She plans to quit cold [place] on April 1st. I gave pt smoking cessation information.  Melina Copa RN 02/04/2015 2:26 PM

## 2015-02-04 NOTE — Progress Notes (Signed)
Nutrition Education Note  RD consulted for nutrition education regarding Heart Failure and Gout.   RD provided "Purine-Restricted Nutrition Therapy" handout from the Academy of [employer]etics. Reviewed patient's dietary recall. Review list of high purine and moderate purine foods to limit or avoid. Discussed alternatives that are lower in purines. Pt has received low sodium nutrition therapy education in the past; she denies any further questions or concerns at this time. She states that her recent exacerbation of heart failure was related to drinking more fluid. Provide thirst-quenching tips. Discouraged intake of processed foods and use of salt shaker. Encouraged fresh fruits and vegetables as well as whole grain sources of carbohydrates to maximize fiber intake.   Teach back method used.  Expect good compliance.  Body mass index is 29.95 kg/(m^2). Pt meets criteria for Overweight based on current BMI.  Current diet order is Carb Modified, patient is consuming approximately 90-100% of meals at this time. Labs and medications reviewed. No further nutrition interventions warranted at this time. RD contact information provided. If additional nutrition issues arise, please re-consult RD.   Ian Malkin RD, LDN Inpatient Clinical Dietitian Pager: [phone] After Hours Pager: [phone]

## 2015-02-05 LAB — BASIC METABOLIC PANEL
Anion gap: 11 (ref 5–15)
BUN: 42 mg/dL — ABNORMAL HIGH (ref 6–23)
CO2: 28 mmol/L (ref 19–32)
Calcium: 9.9 mg/dL (ref 8.4–10.5)
Chloride: 96 mmol/L (ref 96–112)
Creatinine, Ser: 1.72 mg/dL — ABNORMAL HIGH (ref 0.50–1.10)
GFR calc Af Amer: 35 mL/min — ABNORMAL LOW (ref >90)
GFR calc non Af Amer: 30 mL/min — ABNORMAL LOW (ref >90)
Glucose, Bld: 59 mg/dL — ABNORMAL LOW (ref 70–99)
Potassium: 4.7 mmol/L (ref 3.5–5.1)
Sodium: 135 mmol/L (ref 135–145)

## 2015-02-05 MED ORDER — ISOSORBIDE DINITRATE 20 MG PO TABS: 20.0000 mg | ORAL_TABLET | Freq: Two times a day (BID) | ORAL | Status: DC

## 2015-02-05 MED ORDER — METOLAZONE 2.5 MG PO TABS
2.5000 mg | ORAL_TABLET | Freq: Every day | ORAL | Status: DC | PRN
  Filled 2015-02-05: qty 1

## 2015-02-05 MED ORDER — HYDRALAZINE HCL 25 MG PO TABS
25.0000 mg | ORAL_TABLET | Freq: Three times a day (TID) | ORAL | Status: DC
  Filled 2015-02-05: qty 1

## 2015-02-05 MED ORDER — COLCHICINE 0.6 MG PO TABS: 0.6000 mg | ORAL_TABLET | Freq: Every day | ORAL | Status: DC

## 2015-02-05 MED ORDER — METOLAZONE 2.5 MG PO TABS: 2.5000 mg | ORAL_TABLET | Freq: Every day | ORAL | Status: DC | PRN

## 2015-02-05 MED ORDER — TORSEMIDE 20 MG PO TABS
20.0000 mg | ORAL_TABLET | Freq: Two times a day (BID) | ORAL | Status: DC
  Filled 2015-02-05: qty 1

## 2015-02-05 MED ORDER — TORSEMIDE 20 MG PO TABS: 20.0000 mg | ORAL_TABLET | Freq: Two times a day (BID) | ORAL | Status: DC

## 2015-02-05 MED ORDER — HYDRALAZINE HCL 25 MG PO TABS
25.0000 mg | ORAL_TABLET | Freq: Three times a day (TID) | ORAL | Status: DC
Start: 2015-02-05 — End: 2015-02-05

## 2015-02-05 MED ORDER — CARVEDILOL 6.25 MG PO TABS: 6.2500 mg | ORAL_TABLET | Freq: Two times a day (BID) | ORAL | Status: DC

## 2015-02-05 MED ORDER — POTASSIUM CHLORIDE CRYS ER 20 MEQ PO TBCR: 20.0000 meq | EXTENDED_RELEASE_TABLET | Freq: Every day | ORAL | Status: DC

## 2015-02-05 MED ORDER — HYDRALAZINE HCL 25 MG PO TABS: 25.0000 mg | ORAL_TABLET | Freq: Three times a day (TID) | ORAL | Status: DC

## 2015-02-05 MED ORDER — SPIRONOLACTONE 25 MG PO TABS: 25.0000 mg | ORAL_TABLET | Freq: Every day | ORAL | Status: DC

## 2015-02-05 NOTE — Progress Notes (Signed)
IV removed. Bus pass given. Pt dressed and appeared in good condition. No questions at this time. Will be wheeled down and discharged. Jilda Panda RN

## 2015-02-05 NOTE — Discharge Summary (Signed)
Physician Discharge Summary  Deborah Ho WLN:[id number] DOB: [birth year] DOA: 01/29/2015  PCP: Elisabeth Cara, PA-C  Admit date: 01/29/2015 Discharge date: 02/05/2015  Time spent: 35 minutes  Recommendations for Outpatient Follow-up:  1. Needs B-met to follow renal function.  2. Adjust Medications depending on BP .   Discharge Diagnoses:    Acute systolic CHF (congestive heart failure), NYHA class 3   HLD (hyperlipidemia)   Tobacco abuse   Acute kidney injury   HTN (hypertension)   Cardiomyopathy, ischemic, EF 30-35 % Echo 05/04/14.   CAD (coronary artery disease)   Hypokalemia   Pneumonia, community acquired   Systolic CHF, acute on chronic   Pleural effusion   Pleural effusion, right   Bilateral leg edema   Essential hypertension   Discharge Condition: stable.   Diet recommendation: heart healthy  Filed Weights   02/03/15 0547 02/04/15 0602 02/05/15 7408  Weight: 84.142 kg (185 lb 8 oz) 82.918 kg (182 lb 12.8 oz) 83.235 kg (183 lb 8 oz)    History of present illness:  64 year old female with history of hypertension, hyperlipidemia, ongoing tobacco use, CAD status post anterior STEMI with early in-stent thrombosis after missed dose of Brilinta s/p PCI at 2 into LAD in 04/2014, ischemic cardiomyopathy with EF of 35 -40% multiple hospitalization for CHF exacerbation presented to the ED with cough with shortness of breath for the past 3 weeks. Patient reports cough with thick whitish phlegm associated with increased fatigue and dyspnea on exertion . Patient reports that she has difficulty getting out of her routine activities. She has also noticed increased leg swelling for the past 2 weeks. Patient had called her cardiologist office and was instructed to take extra dose of Lasix which did not help her symptoms. She reports that she has been compliant with her diet, sodium restriction and medication. She denies any recent illness or sick contacts. She also reports having  several episodes of vomiting mainly of watery liquid. Denies abdominal pain or diarrhea. Reports subjective chills. Patient denies headache, dizziness, fever,chest pain, palpitations, abdominal pain, bowel or urinary symptoms.  Denies checking her weight at home. Denies loss of appetite.  Hospital Course:  Acute Systolic CHF exacerbation -holding IV Lasix 80 mg TID due to hypotension -ECHO with lower EF at 20 % -Not on aspirin due to allergy, continue Coreg 6.25 mg twice a day -Heart failure team following.  -Weight: 93---91--90 --86 -negative 13 L.  -Cr decrease to 1.7. Patient will be discharge on torsemide 20 mg BID.  -hydralazine , Imdur, spironolactone resume.  -holding cozaar at discharge due to renal failure.   AKI Worsening renal function. 1.4 to 1.9.  Diuretics were on hold.  Cr has decrease to 1.7. Plan to resume diuretics at discharge.   Hypotension, resolved after holding BP medications. BP improved. Plan to resume medications at discharge.   Right pleura effusion: Transudate.  S/P thoracentesis 3-15. No WBC on pleural fluid.  Legionella and strep negative.  Afebrile, no leukocytosis.  Antibiotics discontinue 3-16.  Left foot pain; ? Gout,. mildly elevated uric acid. Pain better with colchicine.   Coronary artery disease with STEMI s/p DES Continue Brilinta , BB and statin.   Nausea and vomiting Resolved.   Tobacco abuse: counseled on cessation. patient Plans to quit  hypokalemia Replenish   UTI;  Report increase frequency, although she is on lasix.  UA with 100,000 E coli.  Will treat her with 3 days of antibiotics.  Will give 2 doses of diflucan for yeast  infection.   Procedures:  ECHO: Ef 20 %  Consultations:  Cardiology, HF team.   Discharge Exam: Filed Vitals:   02/05/15 0808  BP: 121/75  Pulse: 94  Temp:   Resp:     General: Alert in no distress.  Cardiovascular: S 1, S 2 RRR Respiratory: CTA  Discharge  Instructions   Discharge Instructions    Diet - low sodium heart healthy    Complete by:  As directed      Increase activity slowly    Complete by:  As directed           Current Discharge Medication List    START taking these medications   Details  carvedilol (COREG) 6.25 MG tablet Take 1 tablet (6.25 mg total) by mouth 2 (two) times daily with a meal. Qty: 60 tablet, Refills: 0    colchicine 0.6 MG tablet Take 1 tablet (0.6 mg total) by mouth daily. Qty: 10 tablet, Refills: 0    isosorbide dinitrate (ISORDIL) 20 MG tablet Take 1 tablet (20 mg total) by mouth 2 (two) times daily. Qty: 60 tablet, Refills: 0    metolazone (ZAROXOLYN) 2.5 MG tablet Take 1 tablet (2.5 mg total) by mouth daily as needed (for weight greater than 187 Pd). Qty: 30 tablet, Refills: 1    torsemide (DEMADEX) 20 MG tablet Take 1 tablet (20 mg total) by mouth 2 (two) times daily. Qty: 60 tablet, Refills: 0      CONTINUE these medications which have NOT CHANGED   Details  atorvastatin (LIPITOR) 80 MG tablet Take 0.5 tablets (40 mg total) by mouth daily at 6 PM. Qty: 30 tablet, Refills: 6    cetirizine (ZYRTEC) 10 MG tablet Take 10 mg by mouth daily as needed for allergies.    hydrALAZINE (APRESOLINE) 25 MG tablet Take 1 tablet (25 mg total) by mouth every 8 (eight) hours. Qty: 90 tablet, Refills: 6    nitroGLYCERIN (NITROSTAT) 0.4 MG SL tablet Place 0.4 mg under the tongue every 5 (five) minutes as needed for chest pain.    spironolactone (ALDACTONE) 25 MG tablet Take 1 tablet (25 mg total) by mouth daily. Qty: 30 tablet, Refills: 6    ticagrelor (BRILINTA) 90 MG TABS tablet Take 1 tablet (90 mg total) by mouth 2 (two) times daily. Qty: 60 tablet, Refills: 11    potassium chloride SA (K-DUR,KLOR-CON) 20 MEQ tablet Take 1 tablet (20 mEq total) by mouth daily. Qty: 30 tablet, Refills: 6      STOP taking these medications     furosemide (LASIX) 40 MG tablet        Allergies  Allergen  Reactions  . Aspirin Swelling    Chewable children's aspirin makes patients tongue and face swell  . Effient [Prasugrel] Swelling    Patient's tongue and face swells  . Lactose Intolerance (Gi) Other (See Comments)    REACTION: stomach upset  . Robitussin Dm [Guaifenesin-Dm] Swelling    Patient's tongue swells  . Sulfa Antibiotics Swelling  . Wheat Bran Other (See Comments)    REACTION: unknown   Follow-up Information    Follow up with FULBRIGHT, VIRGINIA E, PA-C On 02/12/2015.   Specialty:  Family Medicine   Why:  _0 :20 pm spoke with Adriene   Contact information:   [address] [place] [place]  [address] [phone]       Follow up with Glori Bickers, MD On 02/12/2015.   Specialty:  Cardiology   Why:  at 0920 in the  [facility]--gate code 7000-please bring all medications   Contact information:   [address] [place] [address] [phone]        The results of significant diagnostics from this hospitalization (including imaging, microbiology, ancillary and laboratory) are listed below for reference.    Significant Diagnostic Studies: Dg Chest 1 View  01/29/2015   CLINICAL DATA:  Pleural effusion.  Post right thoracentesis.  EXAM: CHEST  1 VIEW  COMPARISON:  01/29/2015  FINDINGS: Cardiomegaly. Mediastinal contours are within normal missed. Small right pleural effusion, decreased since prior study. No pneumothorax following thoracentesis. Right basilar opacity likely reflects atelectasis, also improved.  IMPRESSION: Improving right effusion and right base atelectasis. No pneumothorax following thoracentesis.   Electronically Signed   By: Rolm Baptise M.D.   On: 01/29/2015 09:54   Dg Chest Portable 1 View  01/29/2015   CLINICAL DATA:  Cough for 3 weeks, with bilateral lower extremity swelling. Initial encounter.  EXAM: PORTABLE CHEST - 1 VIEW  COMPARISON:  Chest radiograph performed 09/13/2014  FINDINGS: A moderate right-sided  pleural effusion is again noted, with right basilar airspace opacification, concerning for either pneumonia or asymmetric pulmonary edema. More mild left basilar airspace opacity is noted. No pneumothorax is seen.  The cardiomediastinal silhouette is borderline enlarged. No acute osseous abnormalities are identified.  IMPRESSION: Moderate right pleural effusion, with right basilar airspace opacification, concerning for either pneumonia or asymmetric pulmonary edema. More mild left basilar airspace opacity noted. Borderline cardiomegaly.   Electronically Signed   By: Garald Balding M.D.   On: 01/29/2015 03:29   US Thoracentesis Asp Pleural Space W/img Guide  01/29/2015   INDICATION: Symptomatic Rt sided pleural effusion  EXAM: US THORACENTESIS ASP PLEURAL SPACE W/IMG GUIDE  COMPARISON:  None.  MEDICATIONS: 10 cc 1% lidocaine  COMPLICATIONS: None immediate  TECHNIQUE: Informed written consent was obtained from the patient after a discussion of the risks, benefits and alternatives to treatment. A timeout was performed prior to the initiation of the procedure.  Initial ultrasound scanning demonstrates a Right pleural effusion. The lower chest was prepped and draped in the usual sterile fashion. 1% lidocaine was used for local anesthesia.  Under direct ultrasound guidance, a 19 gauge, 7-cm, Yueh catheter was introduced. An ultrasound image was saved for documentation purposes. the thoracentesis was performed. The catheter was removed and a dressing was applied. The patient tolerated the procedure well without immediate post procedural complication. The patient was escorted to have an upright chest radiograph.  FINDINGS: A total of approximately 1 liters of yellow fluid was removed. Requested samples were sent to the laboratory.  IMPRESSION: Successful ultrasound-guided R sided thoracentesis yielding 1 liters of pleural fluid.  Read by:  Lavonia Drafts [facility]   Electronically Signed   By: Sandi Mariscal M.D.   On:  01/29/2015 11:56    Microbiology: Recent Results (from the past 240 hour(s))  Culture, Urine     Status: None   Collection Time: 01/29/15  2:24 AM  Result Value Ref Range Status   Specimen Description URINE, CLEAN CATCH  Final   Special Requests ADDED [id number]  Final   Colony Count   Final    >=100,000 COLONIES/ML Performed at [facility] Performed at [facility]    Report Status 01/31/2015 FINAL  Final   Organism ID, Bacteria ESCHERICHIA COLI  Final      Susceptibility  Escherichia coli - MIC*    AMPICILLIN <=2 SENSITIVE Sensitive     CEFAZOLIN <=4 SENSITIVE Sensitive     CEFTRIAXONE <=1 SENSITIVE Sensitive     CIPROFLOXACIN <=0.25 SENSITIVE Sensitive     GENTAMICIN <=1 SENSITIVE Sensitive     LEVOFLOXACIN <=0.12 SENSITIVE Sensitive     NITROFURANTOIN <=16 SENSITIVE Sensitive     TOBRAMYCIN <=1 SENSITIVE Sensitive     TRIMETH/SULFA <=20 SENSITIVE Sensitive     PIP/TAZO <=4 SENSITIVE Sensitive     * ESCHERICHIA COLI  Culture, blood (routine x 2) Call MD if unable to obtain prior to antibiotics being given     Status: None   Collection Time: 01/29/15 10:15 AM  Result Value Ref Range Status   Specimen Description BLOOD RIGHT ANTECUBITAL  Final   Special Requests BOTTLES DRAWN AEROBIC AND ANAEROBIC 10MLS  Final   Culture   Final    NO GROWTH 5 DAYS Performed at [facility]    Report Status 02/04/2015 FINAL  Final  Culture, blood (routine x 2) Call MD if unable to obtain prior to antibiotics being given     Status: None   Collection Time: 01/29/15 10:30 AM  Result Value Ref Range Status   Specimen Description BLOOD LEFT ANTECUBITAL  Final   Special Requests BOTTLES DRAWN AEROBIC AND ANAEROBIC 10MLS  Final   Culture   Final    NO GROWTH 5 DAYS Performed at [facility]    Report Status 02/04/2015 FINAL  Final     Labs: Basic Metabolic Panel:  Recent Labs Lab 02/01/15 0417  02/01/15 1711 02/02/15 0334 02/03/15 0555 02/04/15 0413 02/05/15 0412  NA 134*  --  135 134* 135 135  K 3.7 5.2* 4.2 4.3 4.4 4.7  CL 96  --  96 95* 95* 96  CO2 26  --  _0 GLUCOSE 95  --  77 87 102* 59*  BUN 23  --  30* 29* 36* 42*  CREATININE 1.35*  --  1.43* 1.48* 1.90* 1.72*  CALCIUM 9.0  --  9.5 9.5 9.7 9.9  MG  --  2.3  --   --   --   --    Liver Function Tests:  Recent Labs Lab 01/30/15 1828  AST 27  ALT 20  ALKPHOS 148*  BILITOT 0.8  PROT 5.8*  ALBUMIN 3.0*   No results for input(s): LIPASE, AMYLASE in the last 168 hours. No results for input(s): AMMONIA in the last 168 hours. CBC: No results for input(s): WBC, NEUTROABS, HGB, HCT, MCV, PLT in the last 168 hours. Cardiac Enzymes: No results for input(s): CKTOTAL, CKMB, CKMBINDEX, TROPONINI in the last 168 hours. BNP: BNP (last 3 results) No results for input(s): BNP in the last 8760 hours.  ProBNP (last 3 results)  Recent Labs  08/22/14 1226 09/13/14 1854 11/05/14 1143  PROBNP 3059.0* 12687.0* 321.0*    CBG:  Recent Labs Lab 02/02/15 1201  GLUCAP 108*       Signed:  Regalado, Belkys A  [facility] 02/05/2015, 1:21 PM

## 2015-02-05 NOTE — Progress Notes (Signed)
Advanced Heart Failure Rounding Note   Subjective:    Deborah Ho is a 64 y.o. female with a history of HTN, HLD, tobacco abuse, CAD s/p anterior STEMI with early in-stent thorombosis for missed dose of Brillinta s/p PCI x 2 into LAD (04/2014) and chronic systolic HF with EF 35-40% who is admitted for ADHF and R pleural effusion.   Effusion tapped on 3/15 (transudative). Echo with EF down to 20%  Overall she has diuresed 24 pounds. Yesterday diuretics and losartan held due to rising creatinine and low BP. Improved today.   Denies SOB. Hypoglycemic episode this am. But feels better now.   Creatinine 1.4>1.9 > 1.7  Objective:   Weight Range:  Vital Signs:   Temp:  [97.4 F (36.3 C)-97.9 F (36.6 C)] 97.4 F (36.3 C) (03/22 5621) Pulse Rate:  [82-94] 94 (03/22 0808) Resp:  [18] 18 (03/22 0608) BP: (118-143)/(72-80) 121/75 mmHg (03/22 0808) SpO2:  [97 %-100 %] 97 % (03/22 3086) Weight:  [83.235 kg (183 lb 8 oz)] 83.235 kg (183 lb 8 oz) (03/22 0608) Last BM Date: 02/04/15  Weight change: Filed Weights   02/03/15 0547 02/04/15 0602 02/05/15 0608  Weight: 84.142 kg (185 lb 8 oz) 82.918 kg (182 lb 12.8 oz) 83.235 kg (183 lb 8 oz)    Intake/Output:   Intake/Output Summary (Last 24 hours) at 02/05/15 1158 Last data filed at 02/05/15 0921  Gross per 24 hour  Intake   1372 ml  Output   1650 ml  Net   -278 ml     Physical Exam: General: Sitting in chair. No resp difficulty HEENT: normal Neck: supple. JVP flat. Carotids 2+ bilat; no bruits. No lymphadenopathy or thryomegaly appreciated. Cor: PMI nondisplaced. Regular rate & rhythm. 2/6 MR no s3 Lungs: clear x decreased at R base Abdomen: soft, nontender, nondistended. No hepatosplenomegaly. No bruits or masses. Good bowel sounds. Extremities: no cyanosis, clubbing, rash, trace  edema Neuro: alert & orientedx3, cranial nerves grossly intact. moves all 4 extremities w/o difficulty. Affect pleasant  Telemetry: SR 80s  (personally reviewed)  Labs: Basic Metabolic Panel:  Recent Labs Lab 02/01/15 0417 02/01/15 1711 02/02/15 0334 02/03/15 0555 02/04/15 0413 02/05/15 0412  NA 134*  --  135 134* 135 135  K 3.7 5.2* 4.2 4.3 4.4 4.7  CL 96  --  96 95* 95* 96  CO2 26  --  28 30 29 28   GLUCOSE 95  --  77 87 102* 59*  BUN 23  --  30* 29* 36* 42*  CREATININE 1.35*  --  1.43* 1.48* 1.90* 1.72*  CALCIUM 9.0  --  9.5 9.5 9.7 9.9  MG  --  2.3  --   --   --   --     Liver Function Tests:  Recent Labs Lab 01/30/15 1828  AST 27  ALT 20  ALKPHOS 148*  BILITOT 0.8  PROT 5.8*  ALBUMIN 3.0*   No results for input(s): LIPASE, AMYLASE in the last 168 hours. No results for input(s): AMMONIA in the last 168 hours.  CBC: No results for input(s): WBC, NEUTROABS, HGB, HCT, MCV, PLT in the last 168 hours.  Cardiac Enzymes: No results for input(s): CKTOTAL, CKMB, CKMBINDEX, TROPONINI in the last 168 hours.  BNP: BNP (last 3 results) No results for input(s): BNP in the last 8760 hours.  ProBNP (last 3 results)  Recent Labs  08/22/14 1226 09/13/14 1854 11/05/14 1143  PROBNP 3059.0* 12687.0* 321.0*      Other  results:  Imaging: No results found.   Medications:     Scheduled Medications: . atorvastatin  40 mg Oral q1800  . benzonatate  200 mg Oral TID  . carvedilol  6.25 mg Oral BID WC  . colchicine  0.6 mg Oral Daily  . enoxaparin (LOVENOX) injection  40 mg Subcutaneous Q24H  . loratadine  10 mg Oral Daily  . sodium chloride  3 mL Intravenous Q12H  . ticagrelor  90 mg Oral BID    Infusions:    PRN Medications: sodium chloride, acetaminophen, albuterol, HYDROcodone-acetaminophen, nitroGLYCERIN, ondansetron (ZOFRAN) IV, sodium chloride   Assessment:   1. Acute on chronic systolic HF 2. Ischemic CM EF 35% by echo 12/15 -> 20% 01/30/15 3. R pleural effusion  --s/p thoracentesis 01/29/15 (transudative) 4. CAD s/p anterior MI 5. Dietary non compliance 6. Acute respiratory  failure, improved 7. Hypokalemia 8. ASA allergy 9. Tobacco use  Plan/Discussion:    Renal function better today. Overall weight down 24 pounds.  Can go home today with close outpatient f/u with HF Clinic and Paramedicine.   Meds on d/c:  Torsemide 20 bid (switch from lasix)  Metolazone 2.5 mg prn daily for weight of 187 or higher  Spiro 25 mg daily  Hydralazine 25 tid  Isordil 20 tid  Brilinta 90 bid  Kcl 20 daily  Atorva 40 qhs  Carvedilol 6.25 bid  Would hold losartan for now due to low BP (was not on PTA). Can try to add them in clinic.   Fu/ HF Clinic 3/29 at 920a.  Length of Stay: 7  Arvilla Meres MD 02/05/2015, 11:58 AM  Advanced Heart Failure Team Pager [phone] (M-F; 7a - 4p)  Please contact [facility] for night-coverage after hours (4p -7a ) and weekends on [email or web address]

## 2015-02-05 NOTE — Progress Notes (Signed)
Discharge instructions and prescriptions given and explained to pt.  All questions answered to pt's satisfaction.  Pt instructed to check BP before taking BP meds at home.  Awaiting CSW to bring bus pass for pt.  Report passed to [facility]. Sherald Barge

## 2015-02-05 NOTE — Progress Notes (Signed)
According to labs by phlebotomist this AM blood sugar for patient was 59. Given orange juice to bump up until breakfast.

## 2015-02-12 ENCOUNTER — Encounter (HOSPITAL_COMMUNITY): Payer: Self-pay

## 2015-02-12 ENCOUNTER — Ambulatory Visit (HOSPITAL_COMMUNITY)
Admit: 2015-02-12 | Discharge: 2015-02-12 | Disposition: A | Discharge status: Home / Self Care | Payer: No Typology Code available for payment source | Source: Ambulatory Visit | Attending: Internal Medicine | Admitting: Internal Medicine

## 2015-02-12 VITALS — BP 122/80 | HR 90 | Wt 189.0 lb

## 2015-02-12 DIAGNOSIS — J90 Pleural effusion, not elsewhere classified: Secondary | ICD-10-CM | POA: Insufficient documentation

## 2015-02-12 DIAGNOSIS — I5022 Chronic systolic (congestive) heart failure: Secondary | ICD-10-CM | POA: Diagnosis not present

## 2015-02-12 DIAGNOSIS — I5023 Acute on chronic systolic (congestive) heart failure: Secondary | ICD-10-CM | POA: Diagnosis not present

## 2015-02-12 DIAGNOSIS — I255 Ischemic cardiomyopathy: Secondary | ICD-10-CM | POA: Diagnosis not present

## 2015-02-12 DIAGNOSIS — I2510 Atherosclerotic heart disease of native coronary artery without angina pectoris: Secondary | ICD-10-CM | POA: Insufficient documentation

## 2015-02-12 LAB — BASIC METABOLIC PANEL
Anion gap: 3 — ABNORMAL LOW (ref 5–15)
BUN: 23 mg/dL (ref 6–23)
CO2: 27 mmol/L (ref 19–32)
Calcium: 9.6 mg/dL (ref 8.4–10.5)
Chloride: 111 mmol/L (ref 96–112)
Creatinine, Ser: 1.27 mg/dL — ABNORMAL HIGH (ref 0.50–1.10)
GFR calc Af Amer: 51 mL/min — ABNORMAL LOW (ref >90)
GFR calc non Af Amer: 44 mL/min — ABNORMAL LOW (ref >90)
Glucose, Bld: 95 mg/dL (ref 70–99)
Potassium: 4.6 mmol/L (ref 3.5–5.1)
Sodium: 141 mmol/L (ref 135–145)

## 2015-02-12 NOTE — Patient Instructions (Signed)
TAKE Metolazone 2.5 mg today if weight does not go down call the office.  LABS today. (bmet)  FOLLOW UP in 2 weeks.

## 2015-02-12 NOTE — Progress Notes (Signed)
Patient ID: Deborah Ho, female   DOB: [birth year], 64 y.o.   MRN: [id number] PCP: Dr Randa Evens  Primary Cardiologist: Dr Sw[place]   HPI: [name] is a 64 y.o. female with a history of HTN, HLD, tobacco abuse, CAD s/p anterior STEMI with early in-stent thorombosis for missed dose of Brillinta s/p PCI x 2 into LAD (04/2014) and chronic systolic HF with EF down in March to 20%.   Admitted to [facility] with ADHF and R pleural effusion. Effusion tapped on 3/15 (transudative). Echo with EF down to 20%. Diuresed with IV lasix and transitioned torsemide + metolazone as needed for weight >187 pounds. Creatinine was 1.7 on the day discharge. Discharge weight 183 pounds.   She returns for post hospital follow up for heart failure. Denies SOB/PND/Orthopnea./CP. Weight at home 185-189 pounds. Taking all medications except spiro. Deborah Ho was stopped in the hospital due to elevated creatinine but started back on the day of discharge however she never started.  So she has not been taking spiro. Had salty foods on Sunday for Easter. Eats sausage every morning. She has been limiting fluids to < 2  Liters per day.   01/30/2015 ECHO EF 20%  11/13/2014: ECHO EF 35-40% RV normal.   Labs 02/05/2015: K 4.7 Creatinine 1.72  Labs 01/28/2015: K 3.4 Creatinine 1.16   ROS: All systems negative except as listed in HPI, PMH and Problem List.  SH:  History   Social History  . Marital Status: Widowed    Spouse Name: N/A  . Number of Children: N/A  . Years of Education: N/A   Occupational History  . not employed    Social History Main Topics  . Smoking status: Current Every Day Smoker -- 0.10 packs/day for .5 years    Types: Cigarettes  . Smokeless tobacco: Current User     Comment: down to 3 cigarettes daily (08/03/14)  . Alcohol Use: No  . Drug Use: No  . Sexual Activity: Not on file   Other Topics Concern  . Not on file   Social History Narrative   Patient has 6 brothers and sisters and none have known coronary  artery disease. She lives alone in [place], but has several brothers in the area.    FH:  Family History  Problem Relation Age of Onset  . Diabetes Mother   . Hypertension Mother     Past Medical History  Diagnosis Date  . GERD (gastroesophageal reflux disease)     Probable  . CAD (coronary artery disease) 05/03/14; 05/09/14    a. anterior STEMI with early in-stent thorombosis for missed dose of Brillinta s/p PCI with DESx 2 into LAD (04/2014)  . HTN (hypertension)   . Hyperlipidemia   . Tobacco use   . Non compliance w medication regimen   . Ischemic cardiomyopathy     a. 04/2014 ECHO with EF 45-50% b.  Repeat 2D echo 08/14/14 with EF down at 15%. Life vest placed    Current Outpatient Prescriptions  Medication Sig Dispense Refill  . atorvastatin (LIPITOR) 80 MG tablet Take 0.5 tablets (40 mg total) by mouth daily at 6 PM. (Patient taking differently: Take 40 mg by mouth every 7 (seven) days. Monday) 30 tablet 6  . carvedilol (COREG) 6.25 MG tablet Take 1 tablet (6.25 mg total) by mouth 2 (two) times daily with a meal. 60 tablet 0  . cetirizine (ZYRTEC) 10 MG tablet Take 10 mg by mouth daily as needed for allergies.    Marland Kitchen colchicine 0.6  MG tablet Take 1 tablet (0.6 mg total) by mouth daily. 10 tablet 0  . hydrALAZINE (APRESOLINE) 25 MG tablet Take 1 tablet (25 mg total) by mouth every 8 (eight) hours. 90 tablet 6  . isosorbide dinitrate (ISORDIL) 20 MG tablet Take 1 tablet (20 mg total) by mouth 2 (two) times daily. 60 tablet 0  . metolazone (ZAROXOLYN) 2.5 MG tablet Take 1 tablet (2.5 mg total) by mouth daily as needed (for weight greater than 187 Pd). 30 tablet 1  . nitroGLYCERIN (NITROSTAT) 0.4 MG SL tablet Place 0.4 mg under the tongue every 5 (five) minutes as needed for chest pain.    . potassium chloride SA (K-DUR,KLOR-CON) 20 MEQ tablet Take 1 tablet (20 mEq total) by mouth daily. 30 tablet 1  . ticagrelor (BRILINTA) 90 MG TABS tablet Take 1 tablet (90 mg total) by mouth 2  (two) times daily. 60 tablet 11  . torsemide (DEMADEX) 20 MG tablet Take 1 tablet (20 mg total) by mouth 2 (two) times daily. 60 tablet 0  . spironolactone (ALDACTONE) 25 MG tablet Take 1 tablet (25 mg total) by mouth daily. (Patient not taking: Reported on 02/12/2015) 30 tablet 6   No current facility-administered medications for this encounter.    Filed Vitals:   02/12/15 0906  BP: 122/80  Pulse: 90  Weight: 189 lb (85.73 kg)  SpO2: 100%    PHYSICAL EXAM:  General:  Well appearing. No resp difficulty HEENT: normal Neck: supple. JVP 10-11 . Carotids 2+ bilaterally; no bruits. No lymphadenopathy or thryomegaly appreciated. Cor: PMI normal. Regular rate & rhythm. No rubs, gallops or murmurs. Lungs: clear Abdomen: soft, nontender, nondistended. No hepatosplenomegaly. No bruits or masses. Good bowel sounds. Extremities: no cyanosis, clubbing, rash,  Deborah Ho LLE 1+ edema Neuro: alert & orientedx3, cranial nerves grossly intact. Moves all 4 extremities w/o difficulty. Affect pleasant.      ASSESSMENT & PLAN: 1. Chronic systolic HF- ICM EF 20% by ECHO 01/30/2015 NYHA II. Volume status elevated likely due to dietary noncompliance.  Continue torsemide 20 mg twice a day as well as metolazone as needed for weight greater than 187 pounds. I have asked her to take metolazone today. Would like her weight less than 185 pounds.  Hold off on spiro for now. May start next visit. Check BMET now.  - Continue carvedilol 6.25 mg twice a day - Continue hydralazine 25 mg tid/isordil 20 mg tid. No ace with elevated creatinine.  - Plan to repeat ECHO after HF meds optimized.  - Lengthy discussion about low salt diet, limiting fluids to < 2 liters and daily weights.   2. Ischemic CM EF 20% 01/30/15 3. R pleural effusion  --s/p thoracentesis 01/29/15 (transudative) 4. CAD s/p anterior MI- - S/P PCI x2 LAD 04/2014 on Birllinta. No evidence of ischemai   Check BMET. Follow up in 2 weeks to reassess volume  and titrate meds. Anticipate adding spiro if renal function ok.   Dinero Chavira NP-C   9:59 AM

## 2015-02-13 ENCOUNTER — Telehealth (HOSPITAL_COMMUNITY): Payer: Self-pay

## 2015-02-13 NOTE — Telephone Encounter (Signed)
Brandie with Paramedicine called to update on patient since yesterday's appointment.  Patient's BP 180/110, however, Brandie states patient never started Isosorbide or metolazone, and only takes hydralazine "when she feels like she needs it" (has her BP checked every other day at ED).  Brandie educated patient and family on purpose of medications to regular fluid, BP, and strengthen heart.  Brandie had patient take these medications after BP check this afternoon, and patient agrees to medication compliance from now on.  Ave Filter

## 2015-02-18 ENCOUNTER — Encounter (HOSPITAL_COMMUNITY): Payer: No Typology Code available for payment source

## 2015-02-23 ENCOUNTER — Encounter (HOSPITAL_COMMUNITY): Payer: Self-pay | Admitting: Family Medicine

## 2015-02-23 ENCOUNTER — Inpatient Hospital Stay (HOSPITAL_COMMUNITY)
Admission: EM | Admit: 2015-02-23 | Discharge: 2015-02-28 | DRG: 291 | Disposition: A | Discharge status: Home / Self Care | Payer: No Typology Code available for payment source | Attending: Internal Medicine | Admitting: Internal Medicine

## 2015-02-23 ENCOUNTER — Emergency Department (HOSPITAL_COMMUNITY): Payer: No Typology Code available for payment source

## 2015-02-23 DIAGNOSIS — E785 Hyperlipidemia, unspecified: Secondary | ICD-10-CM | POA: Diagnosis present

## 2015-02-23 DIAGNOSIS — N179 Acute kidney failure, unspecified: Secondary | ICD-10-CM | POA: Diagnosis present

## 2015-02-23 DIAGNOSIS — I5023 Acute on chronic systolic (congestive) heart failure: Secondary | ICD-10-CM

## 2015-02-23 DIAGNOSIS — Z9114 Patient's other noncompliance with medication regimen: Secondary | ICD-10-CM | POA: Diagnosis present

## 2015-02-23 DIAGNOSIS — Z90711 Acquired absence of uterus with remaining cervical stump: Secondary | ICD-10-CM | POA: Diagnosis present

## 2015-02-23 DIAGNOSIS — F17210 Nicotine dependence, cigarettes, uncomplicated: Secondary | ICD-10-CM | POA: Diagnosis present

## 2015-02-23 DIAGNOSIS — I5043 Acute on chronic combined systolic (congestive) and diastolic (congestive) heart failure: Secondary | ICD-10-CM | POA: Diagnosis not present

## 2015-02-23 DIAGNOSIS — Z7982 Long term (current) use of aspirin: Secondary | ICD-10-CM

## 2015-02-23 DIAGNOSIS — Z886 Allergy status to analgesic agent status: Secondary | ICD-10-CM | POA: Diagnosis not present

## 2015-02-23 DIAGNOSIS — F4323 Adjustment disorder with mixed anxiety and depressed mood: Secondary | ICD-10-CM | POA: Diagnosis not present

## 2015-02-23 DIAGNOSIS — K219 Gastro-esophageal reflux disease without esophagitis: Secondary | ICD-10-CM | POA: Diagnosis present

## 2015-02-23 DIAGNOSIS — Z955 Presence of coronary angioplasty implant and graft: Secondary | ICD-10-CM | POA: Diagnosis not present

## 2015-02-23 DIAGNOSIS — Z720 Tobacco use: Secondary | ICD-10-CM | POA: Diagnosis present

## 2015-02-23 DIAGNOSIS — J948 Other specified pleural conditions: Secondary | ICD-10-CM | POA: Diagnosis not present

## 2015-02-23 DIAGNOSIS — F4322 Adjustment disorder with anxiety: Secondary | ICD-10-CM | POA: Diagnosis not present

## 2015-02-23 DIAGNOSIS — Z882 Allergy status to sulfonamides status: Secondary | ICD-10-CM

## 2015-02-23 DIAGNOSIS — E876 Hypokalemia: Secondary | ICD-10-CM | POA: Diagnosis not present

## 2015-02-23 DIAGNOSIS — R05 Cough: Secondary | ICD-10-CM

## 2015-02-23 DIAGNOSIS — Z9861 Coronary angioplasty status: Secondary | ICD-10-CM

## 2015-02-23 DIAGNOSIS — I255 Ischemic cardiomyopathy: Secondary | ICD-10-CM | POA: Diagnosis present

## 2015-02-23 DIAGNOSIS — J90 Pleural effusion, not elsewhere classified: Secondary | ICD-10-CM | POA: Insufficient documentation

## 2015-02-23 DIAGNOSIS — K769 Liver disease, unspecified: Secondary | ICD-10-CM | POA: Diagnosis present

## 2015-02-23 DIAGNOSIS — I5021 Acute systolic (congestive) heart failure: Secondary | ICD-10-CM | POA: Insufficient documentation

## 2015-02-23 DIAGNOSIS — I10 Essential (primary) hypertension: Secondary | ICD-10-CM | POA: Diagnosis present

## 2015-02-23 DIAGNOSIS — J9601 Acute respiratory failure with hypoxia: Secondary | ICD-10-CM | POA: Diagnosis present

## 2015-02-23 DIAGNOSIS — I509 Heart failure, unspecified: Secondary | ICD-10-CM | POA: Diagnosis not present

## 2015-02-23 DIAGNOSIS — I252 Old myocardial infarction: Secondary | ICD-10-CM

## 2015-02-23 DIAGNOSIS — I2510 Atherosclerotic heart disease of native coronary artery without angina pectoris: Secondary | ICD-10-CM | POA: Diagnosis present

## 2015-02-23 DIAGNOSIS — R059 Cough, unspecified: Secondary | ICD-10-CM

## 2015-02-23 DIAGNOSIS — J209 Acute bronchitis, unspecified: Secondary | ICD-10-CM | POA: Insufficient documentation

## 2015-02-23 LAB — BASIC METABOLIC PANEL
Anion gap: 11 (ref 5–15)
BUN: 14 mg/dL (ref 6–23)
CO2: 20 mmol/L (ref 19–32)
Calcium: 9.4 mg/dL (ref 8.4–10.5)
Chloride: 113 mmol/L — ABNORMAL HIGH (ref 96–112)
Creatinine, Ser: 1.1 mg/dL (ref 0.50–1.10)
GFR calc Af Amer: 61 mL/min — ABNORMAL LOW (ref >90)
GFR calc non Af Amer: 52 mL/min — ABNORMAL LOW (ref >90)
Glucose, Bld: 152 mg/dL — ABNORMAL HIGH (ref 70–99)
Potassium: 3.8 mmol/L (ref 3.5–5.1)
Sodium: 144 mmol/L (ref 135–145)

## 2015-02-23 LAB — CBC
HCT: 45.2 % (ref 36.0–46.0)
Hemoglobin: 14.4 g/dL (ref 12.0–15.0)
MCH: 28.2 pg (ref 26.0–34.0)
MCHC: 31.9 g/dL (ref 30.0–36.0)
MCV: 88.6 fL (ref 78.0–100.0)
Platelets: 275 10*3/uL (ref 150–400)
RBC: 5.1 MIL/uL (ref 3.87–5.11)
RDW: 13.7 % (ref 11.5–15.5)
WBC: 9.8 10*3/uL (ref 4.0–10.5)

## 2015-02-23 LAB — I-STAT TROPONIN, ED: Troponin i, poc: 0 ng/mL (ref 0.00–0.08)

## 2015-02-23 LAB — BRAIN NATRIURETIC PEPTIDE: B Natriuretic Peptide: >4500 pg/mL — ABNORMAL HIGH (ref 0.0–100.0)

## 2015-02-23 MED ORDER — FUROSEMIDE 10 MG/ML IJ SOLN: 20.0000 mg | Freq: Once | INTRAMUSCULAR | Status: DC

## 2015-02-23 MED ORDER — NITROGLYCERIN 0.4 MG SL SUBL: 0.4000 mg | SUBLINGUAL_TABLET | SUBLINGUAL | Status: DC | PRN

## 2015-02-23 MED ORDER — NITROGLYCERIN 2 % TD OINT
0.5000 [in_us] | TOPICAL_OINTMENT | Freq: Four times a day (QID) | TRANSDERMAL | Status: DC
  Administered 2015-02-24 – 2015-02-25 (×6): 0.5 [in_us] via TOPICAL
  Filled 2015-02-23: qty 30

## 2015-02-23 MED ORDER — TICAGRELOR 90 MG PO TABS: 90.0000 mg | ORAL_TABLET | Freq: Every day | ORAL | Status: DC

## 2015-02-23 MED ORDER — ACETAMINOPHEN 325 MG PO TABS
650.0000 mg | ORAL_TABLET | Freq: Four times a day (QID) | ORAL | Status: DC | PRN
  Administered 2015-02-24 – 2015-02-27 (×4): 650 mg via ORAL
  Filled 2015-02-23 (×4): qty 2

## 2015-02-23 MED ORDER — ATORVASTATIN CALCIUM 40 MG PO TABS
40.0000 mg | ORAL_TABLET | ORAL | Status: DC
  Administered 2015-02-25: 40 mg via ORAL
  Filled 2015-02-23: qty 1

## 2015-02-23 MED ORDER — HYDRALAZINE HCL 25 MG PO TABS
25.0000 mg | ORAL_TABLET | Freq: Three times a day (TID) | ORAL | Status: DC
  Administered 2015-02-23 – 2015-02-28 (×7): 25 mg via ORAL
  Filled 2015-02-23 (×17): qty 1

## 2015-02-23 MED ORDER — CARVEDILOL 6.25 MG PO TABS
6.2500 mg | ORAL_TABLET | Freq: Two times a day (BID) | ORAL | Status: DC
  Administered 2015-02-23 – 2015-02-28 (×8): 6.25 mg via ORAL
  Filled 2015-02-23 (×12): qty 1

## 2015-02-23 MED ORDER — FUROSEMIDE 10 MG/ML IJ SOLN
40.0000 mg | Freq: Once | INTRAMUSCULAR | Status: AC
  Administered 2015-02-23: 40 mg via INTRAVENOUS
  Filled 2015-02-23: qty 4

## 2015-02-23 MED ORDER — COLCHICINE 0.6 MG PO TABS: 0.6000 mg | ORAL_TABLET | Freq: Every day | ORAL | Status: DC

## 2015-02-23 MED ORDER — ASPIRIN EC 81 MG PO TBEC
81.0000 mg | DELAYED_RELEASE_TABLET | Freq: Every day | ORAL | Status: DC
  Administered 2015-02-23 – 2015-02-28 (×6): 81 mg via ORAL
  Filled 2015-02-23 (×6): qty 1

## 2015-02-23 MED ORDER — ALBUTEROL SULFATE (2.5 MG/3ML) 0.083% IN NEBU: 2.5000 mg | INHALATION_SOLUTION | RESPIRATORY_TRACT | Status: DC | PRN

## 2015-02-23 MED ORDER — DOCUSATE SODIUM 100 MG PO CAPS
200.0000 mg | ORAL_CAPSULE | Freq: Two times a day (BID) | ORAL | Status: DC
  Administered 2015-02-23 – 2015-02-27 (×4): 200 mg via ORAL
  Filled 2015-02-23 (×11): qty 2

## 2015-02-23 MED ORDER — HYDROCOD POLST-CHLORPHEN POLST 10-8 MG/5ML PO LQCR
5.0000 mL | Freq: Once | ORAL | Status: AC
  Administered 2015-02-23: 5 mL via ORAL
  Filled 2015-02-23: qty 5

## 2015-02-23 MED ORDER — POTASSIUM CHLORIDE CRYS ER 20 MEQ PO TBCR
20.0000 meq | EXTENDED_RELEASE_TABLET | Freq: Every day | ORAL | Status: DC
  Administered 2015-02-23 – 2015-02-24 (×2): 20 meq via ORAL
  Filled 2015-02-23 (×3): qty 1

## 2015-02-23 MED ORDER — ONDANSETRON HCL 4 MG/2ML IJ SOLN: 4.0000 mg | Freq: Four times a day (QID) | INTRAMUSCULAR | Status: DC | PRN

## 2015-02-23 MED ORDER — LISINOPRIL 2.5 MG PO TABS: 2.5000 mg | ORAL_TABLET | Freq: Every day | ORAL | Status: DC

## 2015-02-23 MED ORDER — TICAGRELOR 90 MG PO TABS
90.0000 mg | ORAL_TABLET | Freq: Two times a day (BID) | ORAL | Status: DC
  Administered 2015-02-23 – 2015-02-28 (×10): 90 mg via ORAL
  Filled 2015-02-23 (×11): qty 1

## 2015-02-23 MED ORDER — BENZONATATE 100 MG PO CAPS
100.0000 mg | ORAL_CAPSULE | Freq: Three times a day (TID) | ORAL | Status: DC | PRN
  Administered 2015-02-24 (×3): 100 mg via ORAL
  Filled 2015-02-23 (×4): qty 1

## 2015-02-23 MED ORDER — METOLAZONE 2.5 MG PO TABS
2.5000 mg | ORAL_TABLET | Freq: Every day | ORAL | Status: AC
Start: 2015-02-23 — End: 2015-02-25
  Administered 2015-02-23 – 2015-02-25 (×3): 2.5 mg via ORAL
  Filled 2015-02-23 (×3): qty 1

## 2015-02-23 MED ORDER — IPRATROPIUM-ALBUTEROL 0.5-2.5 (3) MG/3ML IN SOLN: 3.0000 mL | Freq: Once | RESPIRATORY_TRACT | Status: DC

## 2015-02-23 MED ORDER — ONDANSETRON HCL 4 MG/2ML IJ SOLN
4.0000 mg | Freq: Once | INTRAMUSCULAR | Status: AC
  Administered 2015-02-23: 4 mg via INTRAVENOUS
  Filled 2015-02-23: qty 2

## 2015-02-23 MED ORDER — SPIRONOLACTONE 25 MG PO TABS: 25.0000 mg | ORAL_TABLET | Freq: Every day | ORAL | Status: DC

## 2015-02-23 MED ORDER — HYDRALAZINE HCL 20 MG/ML IJ SOLN
10.0000 mg | Freq: Four times a day (QID) | INTRAMUSCULAR | Status: DC | PRN
  Administered 2015-02-23: 10 mg via INTRAVENOUS
  Filled 2015-02-23: qty 1

## 2015-02-23 MED ORDER — FUROSEMIDE 10 MG/ML IJ SOLN
60.0000 mg | Freq: Two times a day (BID) | INTRAMUSCULAR | Status: DC
  Administered 2015-02-23 – 2015-02-26 (×6): 60 mg via INTRAVENOUS
  Filled 2015-02-23 (×8): qty 6

## 2015-02-23 NOTE — Progress Notes (Signed)
PHARMACY - BRILINTA DOSING  Spoke with patient and she has been taking Brilinta once daily. I asked her who told her to do this and she reports Dr. Swaziland told her this however his notes state to take twice daily. Outpatient notes also state patient has poor insight into medical condition and likely noncompliance.  Spoke with Dr. Tresa Endo on call and discussed situation with him, received order to increase dose to twice daily.   I called patient back and reiterated Brilinta is to be taken twice daily to keep the stents open.  [place], [address].D., BCPS Clinical Pharmacist Pager: [phone] 02/23/2015 7:03 PM

## 2015-02-23 NOTE — ED Notes (Signed)
Pt here for coughing and vomiting. sts worsening over the last week.

## 2015-02-23 NOTE — Consult Note (Signed)
Reason for Consult: worsening heart failure - acute on chronic systolic HF Primary Cardiologist: Dr. Peter Martinique Referring Physician: Dr. Ival Bible is an 64 y.o. female.  HPI: Deborah Ho is a 64 yo woman with PMH of anterior STEMI 05/03/14 with proximal 95% LAD s/p PCI and aspirin/brilinta therapy who represented 05/09/14 with early in-stent restenosis requiring emergent PCI for recurrent anterior MI 2/2 missed 1 day of Brilinta with subacute stent thrombosis mLAD treated with overlapping DES at distal edge of previous stent who was recently hospitalized 3/15-3/22 with acute renal failure and heart failure and followed up in CHF clinic 02/12/15 who presents with worsening shortness of breath, elevated BNP and weight gain. She tells me she weighed 194 lbs yesterday and 191 lbs the day before. She was instructed to take one dose of metolazone at home but she did not pick up the prescription as yet. Otherwise, she endorses PND, nausea/vomiting over the last 24 hours. She says she only has 5 glasses of water daily. She tells me she cooks all of her own foods. She is around one person that has liver disease and may have been sick but she lives alone in her apartment/housing. No fever/chills. Some diarrhea which she states is loose BM two times in one day every few days. She's been tolerant of her medications until today when nausea prevented taking anything. She received 40 mg IV lasix in the ER and has already started urinating.   Past Medical History  Diagnosis Date  . GERD (gastroesophageal reflux disease)     Probable  . CAD (coronary artery disease) 05/03/14; 05/09/14    a. anterior STEMI with early in-stent thorombosis for missed dose of Brillinta s/p PCI with DESx 2 into LAD (04/2014)  . HTN (hypertension)   . Hyperlipidemia   . Tobacco use   . Non compliance w medication regimen   . Ischemic cardiomyopathy     a. 04/2014 ECHO with EF 45-50% b.  Repeat 2D echo 08/14/14 with EF down  at 15%. Life vest placed    Past Surgical History  Procedure Laterality Date  . Partial hysterectomy    . Cholecystectomy    . Coronary angioplasty with stent placement  05/03/14    STEMI- stent to LAD DES- Xience alpine  . Coronary angioplasty with stent placement  05/09/14    STEMI- overlapping stent to LAD, pt had missed a dose of Brilinta  . Left heart catheterization with coronary angiogram N/A 05/03/2014    Procedure: LEFT HEART CATHETERIZATION WITH CORONARY ANGIOGRAM;  Surgeon: Peter M Martinique, MD;  Location: [facility] CATH LAB;  Service: Cardiovascular;  Laterality: N/A;  . Percutaneous stent intervention  05/03/2014    Procedure: PERCUTANEOUS STENT INTERVENTION;  Surgeon: Peter M Martinique, MD;  Location: [facility] CATH LAB;  Service: Cardiovascular;;  DES Prox LAD   . Left heart catheterization with coronary angiogram N/A 05/09/2014    Procedure: LEFT HEART CATHETERIZATION WITH CORONARY ANGIOGRAM;  Surgeon: [name], MD;  Location: [facility] CATH LAB;  Service: Cardiovascular;  Laterality: N/A;    Family History  Problem Relation Age of Onset  . Diabetes Mother   . Hypertension Mother     Social History:  reports that she has been smoking Cigarettes.  She has a .05 pack-year smoking history. She uses smokeless tobacco. She reports that she does not drink alcohol or use illicit drugs.  Allergies:  Allergies  Allergen Reactions  . Aspirin Swelling    Chewable children's aspirin makes patients  tongue and face swell  . Effient [Prasugrel] Swelling    Patient's tongue and face swells  . Lactose Intolerance (Gi) Other (See Comments)    REACTION: stomach upset  . Robitussin Dm [Guaifenesin-Dm] Swelling    Patient's tongue swells  . Sulfa Antibiotics Swelling  . Wheat Bran Other (See Comments)    REACTION: unknown    Medications:  I have reviewed the patient's current medications. Prior to Admission:  (Not in a hospital admission) Scheduled: . aspirin EC  81 mg Oral Daily  .  atorvastatin  40 mg Oral Q7 days  . carvedilol  6.25 mg Oral BID WC  . colchicine  0.6 mg Oral Daily  . furosemide  60 mg Intravenous BID  . hydrALAZINE  25 mg Oral 3 times per day  . metolazone  2.5 mg Oral Daily  . nitroGLYCERIN  0.5 inch Topical 4 times per day  . potassium chloride SA  20 mEq Oral Daily  . spironolactone  25 mg Oral Daily  . ticagrelor  90 mg Oral Daily    Results for orders placed or performed during the hospital encounter of 02/23/15 (from the past 48 hour(s))  CBC     Status: None   Collection Time: 02/23/15  1:29 PM  Result Value Ref Range   WBC 9.8 4.0 - 10.5 K/uL   RBC 5.10 3.87 - 5.11 MIL/uL   Hemoglobin 14.4 12.0 - 15.0 g/dL   HCT 45.2 36.0 - 46.0 %   MCV 88.6 78.0 - 100.0 fL   MCH 28.2 26.0 - 34.0 pg   MCHC 31.9 30.0 - 36.0 g/dL   RDW 13.7 11.5 - 15.5 %   Platelets 275 150 - 400 K/uL  Basic metabolic panel     Status: Abnormal   Collection Time: 02/23/15  1:29 PM  Result Value Ref Range   Sodium 144 135 - 145 mmol/L   Potassium 3.8 3.5 - 5.1 mmol/L   Chloride 113 (H) 96 - 112 mmol/L   CO2 20 19 - 32 mmol/L   Glucose, Bld 152 (H) 70 - 99 mg/dL   BUN 14 6 - 23 mg/dL   Creatinine, Ser 1.10 0.50 - 1.10 mg/dL   Calcium 9.4 8.4 - 10.5 mg/dL   GFR calc non Af Amer 52 (L) >90 mL/min   GFR calc Af Amer 61 (L) >90 mL/min    Comment: (NOTE) The eGFR has been calculated using the CKD EPI equation. This calculation has not been validated in all clinical situations. eGFR's persistently <90 mL/min signify possible Chronic Kidney Disease.    Anion gap 11 5 - 15  Brain natriuretic peptide     Status: Abnormal   Collection Time: 02/23/15  3:30 PM  Result Value Ref Range   B Natriuretic Peptide >4500.0 (H) 0.0 - 100.0 pg/mL  I-stat troponin, ED     Status: None   Collection Time: 02/23/15  3:36 PM  Result Value Ref Range   Troponin i, poc 0.00 0.00 - 0.08 ng/mL   Comment 3            Comment: Due to the release kinetics of cTnI, a negative result  within the first hours of the onset of symptoms does not rule out myocardial infarction with certainty. If myocardial infarction is still suspected, repeat the test at appropriate intervals.     Dg Chest 2 View  02/23/2015   CLINICAL DATA:  Productive cough.  EXAM: CHEST  2 VIEW  COMPARISON:  01/29/2015  FINDINGS: Heart size is mildly enlarged. There is pulmonary vascular congestion. The right pleural effusion has increased in size from the previous exam. There is overlying atelectasis and/or airspace consolidation. Thickening along the major fissure is noted.  IMPRESSION: 1. Mild CHF with increase in volume of right pleural effusion.   Electronically Signed   By: Kerby Moors M.D.   On: 02/23/2015 14:11    Review of Systems  Constitutional: Positive for malaise/fatigue. Negative for fever and chills.  HENT: Negative for tinnitus.   Eyes: Negative for double vision and photophobia.  Respiratory: Positive for cough and shortness of breath.   Cardiovascular: Positive for orthopnea and leg swelling. Negative for chest pain and palpitations.  Gastrointestinal: Positive for nausea, vomiting and diarrhea. Negative for constipation, blood in stool and melena.  Genitourinary: Negative for dysuria and frequency.  Musculoskeletal: Negative for myalgias and back pain.  Skin: Negative for rash.  Neurological: Negative for dizziness, tingling, tremors, sensory change and headaches.  Endo/Heme/Allergies: Negative for polydipsia. Does not bruise/bleed easily.  Psychiatric/Behavioral: Negative for suicidal ideas, hallucinations and substance abuse.   Blood pressure 166/93, pulse 106, temperature 97.5 F (36.4 C), resp. rate 26, height _0  (1.651 m), weight 87.998 kg (194 lb), SpO2 93 %. Physical Exam  Nursing note and vitals reviewed. Constitutional: She is oriented to person, place, and time. She appears well-developed and well-nourished. She appears distressed.  Mildly uncomfortable  HENT:    Head: Normocephalic and atraumatic.  Nose: Nose normal.  Mouth/Throat: Oropharynx is clear and moist. No oropharyngeal exudate.  Eyes: Conjunctivae and EOM are normal. Pupils are equal, round, and reactive to light. No scleral icterus.  Neck: Normal range of motion. Neck supple. JVD present. No tracheal deviation present.  JVP to mandible  Cardiovascular: Regular rhythm and intact distal pulses.  Exam reveals gallop.   Tachycardic, S3 present  Respiratory: Effort normal. No respiratory distress. She has no wheezes. She has rales.  Bibasilar crackles, decreased BS at right base clearing superiorly  GI: Soft. Bowel sounds are normal. She exhibits no distension. There is no tenderness.  Musculoskeletal: Normal range of motion. She exhibits edema and tenderness.  Neurological: She is alert and oriented to person, place, and time. No cranial nerve deficit.  Skin: Skin is warm and dry. No rash noted. She is not diaphoretic. No erythema.  Psychiatric: She has a normal mood and affect. Her behavior is normal. Thought content normal.   LHC 05/03/2014: proximal 95% LAD, 30% proximal left circumflex stenosis, 20% mild ramus intermedius stenosis, 70-80% long segment RCA stenosis Labs reviewed; K 3.8, Cr 1.1, h/h 14/45, plt 275, na 144, wbc 9.8, Trop 0.00, BNP < 4500 Chest x-ray: right pleural effusion, increased pulmonary vascularization/mildly pulmonary edema ECG reviewed; ST, LAFB, anterior MI - presumed old  Assessment/Plan: Problem List Shortness of Breath Worsening acute on chronic systolic heart failure Elevated BNP Hypertension Dyslipidemia Coronary artery disease Ischemic Cardiomyopathy  Right pleural effusion 64 yo woman with previous anterior MI and subsequent recurrent anterior MI 6/18 and 05/09/14 who had admission for systolic heart failure and renal failure 3/15-3/22 here with worsening heart failure. She is clearly volume overloaded with elevated BNP, pulmonary edema on chest x-ray  and weight gain. Unsure etiology. She is more hypertensive here as well. May be disease progression. On good therapy at home however she gained a few pounds and did not increase therapy. Anticipate needing continued education on daily weights and instructions for increased diuretics based on weight.  - 60 mg IV  lasix, goal net negative 2-3L daily, probably needs 2-3 days of diuresis actively - on carvedilol 6.25 mg bid, hydralazine/isordil 25 mg/20 mg tid - defer ace- initiation until not decompensated - hasn't started on ace-/arb/aldosterone antagonism given recent acute renal failure; consider initiation as inpatient once more euvolemic  - continue aspirin/brilinta, she needs medications dosed today - diet/fluid restriction education - appreciate Internal Medicine assistance - check urinalysis    Deborah Ho 02/23/2015, 5:31 PM

## 2015-02-23 NOTE — ED Provider Notes (Signed)
CSN: [id number]     Arrival date & time 02/23/15  1259 History   First MD Initiated Contact with Patient 02/23/15 1505     Chief Complaint  Patient presents with  . Cough  . Emesis     (Consider location/radiation/quality/duration/timing/severity/associated sxs/prior Treatment) The history is provided by the patient.  Deborah Ho is a 64 y.o. female hx of GERD, CAD, CHF with EF 20% here presenting with cough and shortness of breath. Recently admitted and was diagnosed with pneumonia, acute renal failure, CHF exacerbation. She recently established care with heart failure service and was placed on torsemide. She has been having nonproductive cough for the last 2 weeks. Associated with some vomiting. Denies any fevers or chills.   Past Medical History  Diagnosis Date  . GERD (gastroesophageal reflux disease)     Probable  . CAD (coronary artery disease) 05/03/14; 05/09/14    a. anterior STEMI with early in-stent thorombosis for missed dose of Brillinta s/p PCI with DESx 2 into LAD (04/2014)  . HTN (hypertension)   . Hyperlipidemia   . Tobacco use   . Non compliance w medication regimen   . Ischemic cardiomyopathy     a. 04/2014 ECHO with EF 45-50% b.  Repeat 2D echo 08/14/14 with EF down at 15%. Life vest placed   Past Surgical History  Procedure Laterality Date  . Partial hysterectomy    . Cholecystectomy    . Coronary angioplasty with stent placement  05/03/14    STEMI- stent to LAD DES- Xience alpine  . Coronary angioplasty with stent placement  05/09/14    STEMI- overlapping stent to LAD, pt had missed a dose of Brilinta  . Left heart catheterization with coronary angiogram N/A 05/03/2014    Procedure: LEFT HEART CATHETERIZATION WITH CORONARY ANGIOGRAM;  Surgeon: Peter M Swaziland, MD;  Location: [facility] CATH LAB;  Service: Cardiovascular;  Laterality: N/A;  . Percutaneous stent intervention  05/03/2014    Procedure: PERCUTANEOUS STENT INTERVENTION;  Surgeon: Peter M Swaziland, MD;  Location:  [facility] CATH LAB;  Service: Cardiovascular;;  DES Prox LAD   . Left heart catheterization with coronary angiogram N/A 05/09/2014    Procedure: LEFT HEART CATHETERIZATION WITH CORONARY ANGIOGRAM;  Surgeon: Corky Crafts, MD;  Location: [facility] CATH LAB;  Service: Cardiovascular;  Laterality: N/A;   Family History  Problem Relation Age of Onset  . Diabetes Mother   . Hypertension Mother    History  Substance Use Topics  . Smoking status: Current Every Day Smoker -- 0.10 packs/day for .5 years    Types: Cigarettes  . Smokeless tobacco: Current User     Comment: down to 3 cigarettes daily (08/03/14)  . Alcohol Use: No   OB History    No data available     Review of Systems  Respiratory: Positive for cough.   Gastrointestinal: Positive for vomiting.  All other systems reviewed and are negative.     Allergies  Aspirin; Effient; Lactose intolerance (gi); Robitussin dm; Sulfa antibiotics; and Wheat bran  Home Medications   Prior to Admission medications   Medication Sig Start Date End Date Taking? Authorizing Provider  aspirin EC 81 MG tablet Take 81 mg by mouth daily.   Yes Historical Provider, MD  atorvastatin (LIPITOR) 80 MG tablet Take 0.5 tablets (40 mg total) by mouth daily at 6 PM. Patient taking differently: Take 40 mg by mouth every 7 (seven) days. Monday 12/12/14  Yes Peter M Swaziland, MD  carvedilol (COREG) 6.25 MG tablet Take  1 tablet (6.25 mg total) by mouth 2 (two) times daily with a meal. 02/05/15  Yes Belkys A Regalado, MD  cetirizine (ZYRTEC) 10 MG tablet Take 10 mg by mouth daily as needed for allergies.   Yes Historical Provider, MD  hydrALAZINE (APRESOLINE) 25 MG tablet Take 1 tablet (25 mg total) by mouth every 8 (eight) hours. 02/05/15  Yes Belkys A Regalado, MD  metolazone (ZAROXOLYN) 2.5 MG tablet Take 1 tablet (2.5 mg total) by mouth daily as needed (for weight greater than 187 Pd). 02/05/15  Yes Belkys A Regalado, MD  nitroGLYCERIN (NITROSTAT) 0.4 MG SL tablet Place  0.4 mg under the tongue every 5 (five) minutes as needed for chest pain.   Yes Historical Provider, MD  potassium chloride SA (K-DUR,KLOR-CON) 20 MEQ tablet Take 1 tablet (20 mEq total) by mouth daily. 02/05/15  Yes Belkys A Regalado, MD  ticagrelor (BRILINTA) 90 MG TABS tablet Take 1 tablet (90 mg total) by mouth 2 (two) times daily. Patient taking differently: Take 90 mg by mouth daily.  11/05/14  Yes Rosalio Macadamia, NP  colchicine 0.6 MG tablet Take 1 tablet (0.6 mg total) by mouth daily. Patient not taking: Reported on 02/23/2015 02/05/15   Belkys A Regalado, MD  isosorbide dinitrate (ISORDIL) 20 MG tablet Take 1 tablet (20 mg total) by mouth 2 (two) times daily. Patient not taking: Reported on 02/23/2015 02/06/15   Belkys A Regalado, MD  spironolactone (ALDACTONE) 25 MG tablet Take 1 tablet (25 mg total) by mouth daily. Patient not taking: Reported on 02/12/2015 12/12/14   Peter M Swaziland, MD  torsemide (DEMADEX) 20 MG tablet Take 1 tablet (20 mg total) by mouth 2 (two) times daily. Patient taking differently: Take 20 mg by mouth daily as needed (fluid retention).  02/06/15   Belkys A Regalado, MD   BP 147/95 mmHg  Pulse 103  Temp(Src) 97.5 F (36.4 C)  Resp 22  Ht 5\' 5"  (1.651 m)  Wt 194 lb (87.998 kg)  BMI 32.28 kg/m2  SpO2 97% Physical Exam  Constitutional: She is oriented to person, place, and time.  Uncomfortable, coughing   HENT:  Head: Normocephalic.  Eyes: Conjunctivae are normal. Pupils are equal, round, and reactive to light.  Neck: Normal range of motion. Neck supple.  Cardiovascular: Normal rate, regular rhythm and normal heart sounds.   Pulmonary/Chest: Effort normal.  Crackles bilateral bases   Abdominal: Soft. Bowel sounds are normal. She exhibits no distension. There is no tenderness. There is no rebound.  Musculoskeletal:  1+ edema bilaterally   Neurological: She is alert and oriented to person, place, and time.  Skin: Skin is warm.  Psychiatric: She has a normal mood  and affect. Her behavior is normal. Judgment and thought content normal.  Nursing note and vitals reviewed.   ED Course  Procedures (including critical care time) Labs Review Labs Reviewed  BASIC METABOLIC PANEL - Abnormal; Notable for the following:    Chloride 113 (*)    Glucose, Bld 152 (*)    GFR calc non Af Amer 52 (*)    GFR calc Af Amer 61 (*)    All other components within normal limits  BRAIN NATRIURETIC PEPTIDE - Abnormal; Notable for the following:    B Natriuretic Peptide >4500.0 (*)    All other components within normal limits  CBC  I-STAT TROPOININ, ED    Imaging Review Dg Chest 2 View  02/23/2015   CLINICAL DATA:  Productive cough.  EXAM: CHEST  2  VIEW  COMPARISON:  01/29/2015  FINDINGS: Heart size is mildly enlarged. There is pulmonary vascular congestion. The right pleural effusion has increased in size from the previous exam. There is overlying atelectasis and/or airspace consolidation. Thickening along the major fissure is noted.  IMPRESSION: 1. Mild CHF with increase in volume of right pleural effusion.   Electronically Signed   By: Signa Kell M.D.   On: 02/23/2015 14:11     EKG Interpretation   Date/Time:  Saturday February 23 2015 15:21:27 EDT Ventricular Rate:  104 PR Interval:  136 QRS Duration: 85 QT Interval:  357 QTC Calculation: 470 R Axis:   -58 Text Interpretation:  Sinus tachycardia Left anterior fascicular block Low  voltage, extremity and precordial leads Consider anterior infarct No  significant change since last tracing Confirmed by Tesneem Dufrane  MD, Parisa Pinela ([id number])  on 02/23/2015 3:37:45 PM      MDM   Final diagnoses:  Cough    Deborah Ho is a 64 y.o. female here with SOB. Likely CHF vs pneumonia. Will get labs, BNP, CXR.   5:41 PM BNP > 4500. Given lasix. Cardiology to see patient. Medicine to admit.      Richardean Canal, MD 02/23/15 [phone]

## 2015-02-23 NOTE — H&P (Signed)
Patient Demographics  Zoiee Wimmer, is a 64 y.o. female  MRN: [id number]   DOB - 1951-09-16  Admit Date - 02/23/2015  Outpatient Primary MD for the patient is FULBRIGHT, VIRGINIA E, PA-C   With History of -  Past Medical History  Diagnosis Date  . GERD (gastroesophageal reflux disease)     Probable  . CAD (coronary artery disease) 05/03/14; 05/09/14    a. anterior STEMI with early in-stent thorombosis for missed dose of Brillinta s/p PCI with DESx 2 into LAD (04/2014)  . HTN (hypertension)   . Hyperlipidemia   . Tobacco use   . Non compliance w medication regimen   . Ischemic cardiomyopathy     a. 04/2014 ECHO with EF 45-50% b.  Repeat 2D echo 08/14/14 with EF down at 15%. Life vest placed      Past Surgical History  Procedure Laterality Date  . Partial hysterectomy    . Cholecystectomy    . Coronary angioplasty with stent placement  05/03/14    STEMI- stent to LAD DES- Xience alpine  . Coronary angioplasty with stent placement  05/09/14    STEMI- overlapping stent to LAD, pt had missed a dose of Brilinta  . Left heart catheterization with coronary angiogram N/A 05/03/2014    Procedure: LEFT HEART CATHETERIZATION WITH CORONARY ANGIOGRAM;  Surgeon: Peter M Swaziland, MD;  Location: [facility] CATH LAB;  Service: Cardiovascular;  Laterality: N/A;  . Percutaneous stent intervention  05/03/2014    Procedure: PERCUTANEOUS STENT INTERVENTION;  Surgeon: Peter M Swaziland, MD;  Location: [facility] CATH LAB;  Service: Cardiovascular;;  DES Prox LAD   . Left heart catheterization with coronary angiogram N/A 05/09/2014    Procedure: LEFT HEART CATHETERIZATION WITH CORONARY ANGIOGRAM;  Surgeon: Corky Crafts, MD;  Location: [facility] CATH LAB;  Service: Cardiovascular;  Laterality: N/A;    in for   Chief Complaint  Patient presents with    . Cough  . Emesis     HPI  Jerry Haugen  is a 64 y.o. female,  With H/O Isch cardiomyopathy with Chr diastolic and Systolic CHF EF 20%,  CAD, essential hypertension, dyslipidemia, ongoing smoking, who was recently admitted and discharged for CHF comes back to the hospital with one to two-day history of frothy productive cough, shortness of breath, orthopnea, weight gain of about 5 KG's. In the ER workup consistent with acute on chronic systolic CHF and I was called to admit the patient.  She denies any fever chills, no chest pain palpitations, no abdominal pain, no diarrhea, she agrees to dietary compliance, still smoking, no focal weakness.    Review of Systems    In addition to the HPI above,  No Fever-chills, No Headache, No changes with Vision or hearing, No problems swallowing food or Liquids, No Chest pain, ++ frothy productive Cough & Shortness of Breath, No Abdominal pain, No Nausea or Vommitting, Bowel movements are regular, No Blood in stool  or Urine, No dysuria, No new skin rashes or bruises, No new joints pains-aches,  No new weakness, tingling, numbness in any extremity, No recent weight gain or loss, No polyuria, polydypsia or polyphagia, No significant Mental Stressors.  A full 10 point Review of Systems was done, except as stated above, all other Review of Systems were negative.   Social History History  Substance Use Topics  . Smoking status: Current Every Day Smoker -- 0.10 packs/day for .5 years    Types: Cigarettes  . Smokeless tobacco: Current User     Comment: down to 3 cigarettes daily (08/03/14)  . Alcohol Use: No      Family History Family History  Problem Relation Age of Onset  . Diabetes Mother   . Hypertension Mother       Prior to Admission medications   Medication Sig Start Date End Date Taking? Authorizing Provider  aspirin EC 81 MG tablet Take 81 mg by mouth daily.   Yes Historical Provider, MD  atorvastatin (LIPITOR) 80 MG  tablet Take 0.5 tablets (40 mg total) by mouth daily at 6 PM. Patient taking differently: Take 40 mg by mouth every 7 (seven) days. Monday 12/12/14  Yes Peter M Swaziland, MD  carvedilol (COREG) 6.25 MG tablet Take 1 tablet (6.25 mg total) by mouth 2 (two) times daily with a meal. 02/05/15  Yes Belkys A Regalado, MD  cetirizine (ZYRTEC) 10 MG tablet Take 10 mg by mouth daily as needed for allergies.   Yes Historical Provider, MD  hydrALAZINE (APRESOLINE) 25 MG tablet Take 1 tablet (25 mg total) by mouth every 8 (eight) hours. 02/05/15  Yes Belkys A Regalado, MD  metolazone (ZAROXOLYN) 2.5 MG tablet Take 1 tablet (2.5 mg total) by mouth daily as needed (for weight greater than 187 Pd). 02/05/15  Yes Belkys A Regalado, MD  nitroGLYCERIN (NITROSTAT) 0.4 MG SL tablet Place 0.4 mg under the tongue every 5 (five) minutes as needed for chest pain.   Yes Historical Provider, MD  potassium chloride SA (K-DUR,KLOR-CON) 20 MEQ tablet Take 1 tablet (20 mEq total) by mouth daily. 02/05/15  Yes Belkys A Regalado, MD  ticagrelor (BRILINTA) 90 MG TABS tablet Take 1 tablet (90 mg total) by mouth 2 (two) times daily. Patient taking differently: Take 90 mg by mouth daily.  11/05/14  Yes Rosalio Macadamia, NP  colchicine 0.6 MG tablet Take 1 tablet (0.6 mg total) by mouth daily. Patient not taking: Reported on 02/23/2015 02/05/15   Belkys A Regalado, MD  isosorbide dinitrate (ISORDIL) 20 MG tablet Take 1 tablet (20 mg total) by mouth 2 (two) times daily. Patient not taking: Reported on 02/23/2015 02/06/15   Belkys A Regalado, MD  spironolactone (ALDACTONE) 25 MG tablet Take 1 tablet (25 mg total) by mouth daily. Patient not taking: Reported on 02/12/2015 12/12/14   Peter M Swaziland, MD  torsemide (DEMADEX) 20 MG tablet Take 1 tablet (20 mg total) by mouth 2 (two) times daily. Patient taking differently: Take 20 mg by mouth daily as needed (fluid retention).  02/06/15   Alba Cory, MD    Allergies  Allergen Reactions  . Aspirin  Swelling    Chewable children's aspirin makes patients tongue and face swell  . Effient [Prasugrel] Swelling    Patient's tongue and face swells  . Lactose Intolerance (Gi) Other (See Comments)    REACTION: stomach upset  . Robitussin Dm [Guaifenesin-Dm] Swelling    Patient's tongue swells  . Sulfa Antibiotics Swelling  .  Wheat Bran Other (See Comments)    REACTION: unknown    Physical Exam  Vitals  Blood pressure 166/100, pulse 101, temperature 97.5 F (36.4 C), resp. rate 20, height 5\' 5"  (1.651 m), weight 87.998 kg (194 lb), SpO2 99 %.   1. General middle aged AA female lying in bed in NAD,   2. Normal affect and insight, Not Suicidal or Homicidal, Awake Alert, Oriented X 3.  3. No F.N deficits, ALL C.Nerves Intact, Strength 5/5 all 4 extremities, Sensation intact all 4 extremities, Plantars down going.  4. Ears and Eyes appear Normal, Conjunctivae clear, PERRLA. Moist Oral Mucosa.  5. Supple Neck, +ve JVD, No cervical lymphadenopathy appriciated, No Carotid Bruits.  6. Symmetrical Chest wall movement, Good air movement bilaterally, +ve Rales  7. RRR, No Gallops, Rubs or Murmurs, No Parasternal Heave.  8. Positive Bowel Sounds, Abdomen Soft, No tenderness, No organomegaly appriciated,No rebound -guarding or rigidity.  9.  No Cyanosis, Normal Skin Turgor, No Skin Rash or Bruise. 1+ edema  10. Good muscle tone,  joints appear normal , no effusions, Normal ROM.  11. No Palpable Lymph Nodes in Neck or Axillae     Data Review  CBC  Recent Labs Lab 02/23/15 1329  WBC 9.8  HGB 14.4  HCT 45.2  PLT 275  MCV 88.6  MCH 28.2  MCHC 31.9  RDW 13.7   ------------------------------------------------------------------------------------------------------------------  Chemistries   Recent Labs Lab 02/23/15 1329  NA 144  K 3.8  CL 113*  CO2 20  GLUCOSE 152*  BUN 14  CREATININE 1.10  CALCIUM 9.4    ------------------------------------------------------------------------------------------------------------------ estimated creatinine clearance is 57.4 mL/min (by C-G formula based on Cr of 1.1). ------------------------------------------------------------------------------------------------------------------ No results for input(s): TSH, T4TOTAL, T3FREE, THYROIDAB in the last 72 hours.  Invalid input(s): FREET3   Coagulation profile No results for input(s): INR, PROTIME in the last 168 hours. ------------------------------------------------------------------------------------------------------------------- No results for input(s): DDIMER in the last 72 hours. -------------------------------------------------------------------------------------------------------------------  Cardiac Enzymes No results for input(s): CKMB, TROPONINI, MYOGLOBIN in the last 168 hours.  Invalid input(s): CK ------------------------------------------------------------------------------------------------------------------ Invalid input(s): POCBNP   ---------------------------------------------------------------------------------------------------------------  Urinalysis    Component Value Date/Time   COLORURINE AMBER* 01/29/2015 0224   APPEARANCEUR CLOUDY* 01/29/2015 0224   LABSPEC 1.019 01/29/2015 0224   PHURINE 6.0 01/29/2015 0224   GLUCOSEU NEGATIVE 01/29/2015 0224   HGBUR LARGE* 01/29/2015 0224   BILIRUBINUR NEGATIVE 01/29/2015 0224   KETONESUR NEGATIVE 01/29/2015 0224   PROTEINUR 30* 01/29/2015 0224   UROBILINOGEN 2.0* 01/29/2015 0224   NITRITE POSITIVE* 01/29/2015 0224   LEUKOCYTESUR MODERATE* 01/29/2015 0224    ----------------------------------------------------------------------------------------------------------------  Imaging results:   Dg Chest 2 View  02/23/2015   CLINICAL DATA:  Productive cough.  EXAM: CHEST  2 VIEW  COMPARISON:  01/29/2015  FINDINGS: Heart size is mildly  enlarged. There is pulmonary vascular congestion. The right pleural effusion has increased in size from the previous exam. There is overlying atelectasis and/or airspace consolidation. Thickening along the major fissure is noted.  IMPRESSION: 1. Mild CHF with increase in volume of right pleural effusion.   Electronically Signed   By: Signa Kell M.D.   On: 02/23/2015 14:11    My personal review of EKG: Rhythm S tach, 104/min, non specific ST changes    Assessment & Plan   1. Acute hypoxic respiratory failure due to acute on chronic diastolic and systolic heart failure in a patient with ischemic cardiomyopathy and EF of 20%. Will be admitted to a telemetry bed, IV Lasix, by  mouth Zaroxolyn, salt and fluid intake, nitro paste, continue beta blocker-hydralazine. Add low-dose ACE inhibitor. Monitor BMP closely. Cardiology has been consulted by ER physician. May need scheduled every other day Zaroxolyn upon discharge. We will give oxygen and nebulizer treatments as needed.    2. CAD. Chest pain-free, no acute issues. Continue aspirin, Brilinta, Lipitor and beta blocker for secondary prevention.    3. Ongoing smoking. Counseled to quit.    4. Essential hypertension. Blood pressure and poor control due to shortness of breath and distress in ER, have initiated home blood pressure medications, added Nitropaste. Will order as needed IV hydralazine as well.    5. Dyslipidemia. placed on statin at home dose.     DVT Prophylaxis Heparin    AM Labs Ordered, also please review Full Orders  Family Communication: Admission, patients condition and plan of care including tests being ordered have been discussed with the patient  who indicates understanding and agree with the plan and Code Status.  Code Status Full  Likely DC to Home  Condition GUARDED     Time spent in minutes : 35    Kemora Pinard K M.D on 02/23/2015 at 5:59 PM  Between 7am to 7pm - Pager - [phone]  After 7pm  go to [email or web address] - password [facility]  [facility]  Office  [phone]

## 2015-02-23 NOTE — Progress Notes (Signed)
Hard copy of heart strip placed in pt's chart with the following results: NSR PR 0.18 QRS 0.08 QT 0.32 QTc 0.39 HR 98

## 2015-02-23 NOTE — ED Notes (Signed)
Admitting MD at the bedside.  

## 2015-02-24 DIAGNOSIS — E876 Hypokalemia: Secondary | ICD-10-CM

## 2015-02-24 DIAGNOSIS — J209 Acute bronchitis, unspecified: Secondary | ICD-10-CM | POA: Insufficient documentation

## 2015-02-24 DIAGNOSIS — I5023 Acute on chronic systolic (congestive) heart failure: Secondary | ICD-10-CM

## 2015-02-24 DIAGNOSIS — I255 Ischemic cardiomyopathy: Secondary | ICD-10-CM

## 2015-02-24 DIAGNOSIS — I509 Heart failure, unspecified: Secondary | ICD-10-CM

## 2015-02-24 LAB — MAGNESIUM: Magnesium: 1.7 mg/dL (ref 1.5–2.5)

## 2015-02-24 LAB — BASIC METABOLIC PANEL
Anion gap: 12 (ref 5–15)
BUN: 16 mg/dL (ref 6–23)
CO2: 28 mmol/L (ref 19–32)
Calcium: 9 mg/dL (ref 8.4–10.5)
Chloride: 100 mmol/L (ref 96–112)
Creatinine, Ser: 1.35 mg/dL — ABNORMAL HIGH (ref 0.50–1.10)
GFR calc Af Amer: 47 mL/min — ABNORMAL LOW (ref >90)
GFR calc non Af Amer: 41 mL/min — ABNORMAL LOW (ref >90)
Glucose, Bld: 100 mg/dL — ABNORMAL HIGH (ref 70–99)
Potassium: 3.3 mmol/L — ABNORMAL LOW (ref 3.5–5.1)
Sodium: 140 mmol/L (ref 135–145)

## 2015-02-24 LAB — TSH: TSH: 0.649 u[IU]/mL (ref 0.350–4.500)

## 2015-02-24 MED ORDER — AZITHROMYCIN 250 MG PO TABS
250.0000 mg | ORAL_TABLET | Freq: Every day | ORAL | Status: AC
  Administered 2015-02-25 – 2015-02-28 (×4): 250 mg via ORAL
  Filled 2015-02-24 (×4): qty 1

## 2015-02-24 MED ORDER — HEPARIN SODIUM (PORCINE) 5000 UNIT/ML IJ SOLN
5000.0000 [IU] | Freq: Three times a day (TID) | INTRAMUSCULAR | Status: DC
Start: 2015-02-24 — End: 2015-02-28
  Administered 2015-02-24 – 2015-02-28 (×14): 5000 [IU] via SUBCUTANEOUS
  Filled 2015-02-24 (×14): qty 1

## 2015-02-24 MED ORDER — MAGNESIUM SULFATE 2 GM/50ML IV SOLN
2.0000 g | Freq: Once | INTRAVENOUS | Status: AC
  Administered 2015-02-24: 2 g via INTRAVENOUS
  Filled 2015-02-24: qty 50

## 2015-02-24 MED ORDER — POTASSIUM CHLORIDE CRYS ER 20 MEQ PO TBCR
40.0000 meq | EXTENDED_RELEASE_TABLET | Freq: Once | ORAL | Status: AC
  Administered 2015-02-24: 40 meq via ORAL

## 2015-02-24 MED ORDER — AZITHROMYCIN 500 MG PO TABS
500.0000 mg | ORAL_TABLET | Freq: Every day | ORAL | Status: AC
  Administered 2015-02-24: 500 mg via ORAL
  Filled 2015-02-24: qty 1

## 2015-02-24 MED ORDER — LOSARTAN POTASSIUM 25 MG PO TABS
25.0000 mg | ORAL_TABLET | Freq: Every day | ORAL | Status: DC
  Administered 2015-02-24: 25 mg via ORAL
  Filled 2015-02-24 (×2): qty 1

## 2015-02-24 NOTE — Progress Notes (Addendum)
[facility] PROGRESS NOTE  Deborah Ho GEX:[id number] DOB: 01-15-1951 DOA: 02/23/2015 PCP: [name], PA-C  Brief narrative 64 year old female with history of ischemic cardiomyopathy with EF of 20%, coronary artery disease with history of anterior STEMI in June 2015 with early in-stent restenosis requiring emergent PCI,  hypertension, dyslipidemia, ongoing tobacco use recently hospitalized for CHF exacerbation presented with 2 day history of productive cough with shortness of breath, orthopnea and weight gain of almost 5 kg. Patient admitted for acute on chronic systolic CHF.   Assessment/Plan: Acute on chronic combined systolic and diastolic CHF  Weight is down by about 3 pounds since admission still volume overloaded and needs IV Lasix. Dry weight from last admission of 183 lbs ( still up by 8 lbs)   Continue metolazone, coreg and hydralazine. Resume ARB. Creatinine slowly increasing. Monitor renal function closely. ( went into AKI last admission for which ARB was held ) Replace k and mg Monitor strict I/O and daily weights appreciate cardiology follow up.  CAD with hx of STEMI s/p PCI On brillinta. Continue coreg, statin and  ARB  Acute bronchitis Will place on Z-Pak. Continue antitussives.  DVT prophylaxis: Subcutaneous heparin  Diet: Heart healthy       Code Status: Full code Family Communication: None at bedside Disposition Plan: Home likely in the next 48 hours if clinically improving.   Consultants:  Cardiology  Procedures:  None  Antibiotics:  Z-Pak  HPI/Subjective: Patient seen and examined. Reports her breathing to be better this morning but still has productive cough.  Objective: Filed Vitals:   02/24/15 0537  BP: 154/84  Pulse: 87  Temp: 99.1 F (37.3 C)  Resp: 18    Intake/Output Summary (Last 24 hours) at 02/24/15 1059 Last data filed at 02/24/15 1000  Gross per 24 hour  Intake    680 ml  Output   2601 ml  Net  -1921  ml   Filed Weights   02/23/15 1302 02/23/15 1851 02/24/15 0537  Weight: 87.998 kg (194 lb) 88.769 kg (195 lb 11.2 oz) 87.045 kg (191 lb 14.4 oz)    Exam:   General:  Elderly female in no acute distress  HEENT: No pallor, moist oral mucosa, supple neck, no JVD  Chest: Diminished bibasilar breath sounds, no rhonchi or wheeze  CVS: Normal S1 and S2, no murmurs rub or gallop  GI: Soft, nondistended, nontender, bowel sounds present  Musculoskeletal: Warm, 1+ pitting edema bilaterally  CNS: Alert and oriented  Data Reviewed: Basic Metabolic Panel:  Recent Labs Lab 02/23/15 1329 02/24/15 0611  NA 144 140  K 3.8 3.3*  CL 113* 100  CO2 20 28  GLUCOSE 152* 100*  BUN 14 16  CREATININE 1.10 1.35*  CALCIUM 9.4 9.0  MG  --  1.7   Liver Function Tests: No results for input(s): AST, ALT, ALKPHOS, BILITOT, PROT, ALBUMIN in the last 168 hours. No results for input(s): LIPASE, AMYLASE in the last 168 hours. No results for input(s): AMMONIA in the last 168 hours. CBC:  Recent Labs Lab 02/23/15 1329  WBC 9.8  HGB 14.4  HCT 45.2  MCV 88.6  PLT 275   Cardiac Enzymes: No results for input(s): CKTOTAL, CKMB, CKMBINDEX, TROPONINI in the last 168 hours. BNP (last 3 results)  Recent Labs  02/23/15 1530  BNP >4500.0*    ProBNP (last 3 results)  Recent Labs  08/22/14 1226 09/13/14 1854 11/05/14 1143  PROBNP 3059.0* 12687.0* 321.0*    CBG: No results for input(s):  GLUCAP in the last 168 hours.  No results found for this or any previous visit (from the past 240 hour(s)).   Studies: Dg Chest 2 View  02/23/2015   CLINICAL DATA:  Productive cough.  EXAM: CHEST  2 VIEW  COMPARISON:  01/29/2015  FINDINGS: Heart size is mildly enlarged. There is pulmonary vascular congestion. The right pleural effusion has increased in size from the previous exam. There is overlying atelectasis and/or airspace consolidation. Thickening along the major fissure is noted.  IMPRESSION: 1. Mild  CHF with increase in volume of right pleural effusion.   Electronically Signed   By: Signa Kell M.D.   On: 02/23/2015 14:11    Scheduled Meds: . aspirin EC  81 mg Oral Daily  . [START ON 02/25/2015] atorvastatin  40 mg Oral Q7 days  . azithromycin  500 mg Oral Daily   Followed by  . [START ON 02/25/2015] azithromycin  250 mg Oral Daily  . carvedilol  6.25 mg Oral BID WC  . docusate sodium  200 mg Oral BID  . furosemide  60 mg Intravenous BID  . heparin subcutaneous  5,000 Units Subcutaneous 3 times per day  . hydrALAZINE  25 mg Oral 3 times per day  . losartan  25 mg Oral Daily  . magnesium sulfate 1 - 4 g bolus IVPB  2 g Intravenous Once  . metolazone  2.5 mg Oral Daily  . nitroGLYCERIN  0.5 inch Topical 4 times per day  . potassium chloride SA  20 mEq Oral Daily  . potassium chloride  40 mEq Oral Once  . ticagrelor  90 mg Oral BID   Continuous Infusions:     Time spent: 25 minutes    Eddie North  [facility] Pager [phone] If 7PM-7AM, please contact night-coverage at [email or web address], password [facility] 02/24/2015, 10:59 AM  LOS: 1 day

## 2015-02-24 NOTE — Progress Notes (Signed)
Patient Name: Deborah Ho Date of Encounter: 02/24/2015     Active Problems:   Tobacco abuse   Cardiomyopathy, ischemic, EF 30-35 % Echo 05/04/14.   CAD (coronary artery disease)   Acute on chronic systolic CHF (congestive heart failure)   Essential hypertension   CHF (congestive heart failure)    SUBJECTIVE  Denies chest pain.  Cough is improved with tessalon pearls.  At home her cough was so bad that it would make her vomit.  He has severe left ventricular systolic dysfunction with ejection fraction 20% by echocardiogram March 2016  CURRENT MEDS . aspirin EC  81 mg Oral Daily  . [START ON 02/25/2015] atorvastatin  40 mg Oral Q7 days  . azithromycin  500 mg Oral Daily   Followed by  . [START ON 02/25/2015] azithromycin  250 mg Oral Daily  . carvedilol  6.25 mg Oral BID WC  . docusate sodium  200 mg Oral BID  . furosemide  60 mg Intravenous BID  . heparin subcutaneous  5,000 Units Subcutaneous 3 times per day  . hydrALAZINE  25 mg Oral 3 times per day  . magnesium sulfate 1 - 4 g bolus IVPB  2 g Intravenous Once  . metolazone  2.5 mg Oral Daily  . nitroGLYCERIN  0.5 inch Topical 4 times per day  . potassium chloride SA  20 mEq Oral Daily  . potassium chloride  40 mEq Oral Once  . ticagrelor  90 mg Oral BID    OBJECTIVE  Filed Vitals:   02/23/15 1851 02/23/15 2001 02/24/15 0022 02/24/15 0537  BP: 163/92 131/95 140/90 154/84  Pulse: 101 97 88 87  Temp: 99.5 F (37.5 C) 97.6 F (36.4 C) 97.8 F (36.6 C) 99.1 F (37.3 C)  TempSrc: Oral Oral Oral Oral  Resp: 18 18 18 18   Height: 5' 5.5" (1.664 m)     Weight: 195 lb 11.2 oz (88.769 kg)   191 lb 14.4 oz (87.045 kg)  SpO2: 96% 98% 100% 100%    Intake/Output Summary (Last 24 hours) at 02/24/15 1042 Last data filed at 02/24/15 0600  Gross per 24 hour  Intake    440 ml  Output   2501 ml  Net  -2061 ml   Filed Weights   02/23/15 1302 02/23/15 1851 02/24/15 0537  Weight: 194 lb (87.998 kg) 195 lb 11.2 oz (88.769  kg) 191 lb 14.4 oz (87.045 kg)    PHYSICAL EXAM  General: Pleasant, NAD. Neuro: Alert and oriented X 3. Moves all extremities spontaneously. Psych: Normal affect. HEENT:  Normal  Neck: Supple without bruits or JVD. Lungs:  Resp regular and unlabored, mild fine inspiratory rales at bases Heart: RRR no s3, s4, or murmurs. Abdomen: Soft, non-tender, non-distended, BS + x 4.  Extremities: No clubbing, cyanosis.  There is mild pretibial edema  Accessory Clinical Findings  CBC  Recent Labs  02/23/15 1329  WBC 9.8  HGB 14.4  HCT 45.2  MCV 88.6  PLT 275   Basic Metabolic Panel  Recent Labs  02/23/15 1329 02/24/15 0611  NA 144 140  K 3.8 3.3*  CL 113* 100  CO2 20 28  GLUCOSE 152* 100*  BUN 14 16  CREATININE 1.10 1.35*  CALCIUM 9.4 9.0  MG  --  1.7   Liver Function Tests No results for input(s): AST, ALT, ALKPHOS, BILITOT, PROT, ALBUMIN in the last 72 hours. No results for input(s): LIPASE, AMYLASE in the last 72 hours. Cardiac Enzymes No results for  input(s): CKTOTAL, CKMB, CKMBINDEX, TROPONINI in the last 72 hours. BNP Invalid input(s): POCBNP D-Dimer No results for input(s): DDIMER in the last 72 hours. Hemoglobin A1C No results for input(s): HGBA1C in the last 72 hours. Fasting Lipid Panel No results for input(s): CHOL, HDL, LDLCALC, TRIG, CHOLHDL, LDLDIRECT in the last 72 hours. Thyroid Function Tests  Recent Labs  02/24/15 0611  TSH 0.649    TELE  Normal sinus rhythm  ECG  23-Feb-2015 15:21:27 [facility]/ED ROUTINE RECORD Sinus tachycardia Left anterior fascicular block Low voltage, extremity and precordial leads Consider anterior infarct No significant change since last tracing Confirmed by YAO MD, DAVID ([id number]) on 02/23/2015 3:37:45 PM Personally reviewed. Radiology/Studies  Dg Chest 1 View  01/29/2015   CLINICAL DATA:  Pleural effusion.  Post right thoracentesis.  EXAM: CHEST  1 VIEW  COMPARISON:  01/29/2015  FINDINGS:  Cardiomegaly. Mediastinal contours are within normal missed. Small right pleural effusion, decreased since prior study. No pneumothorax following thoracentesis. Right basilar opacity likely reflects atelectasis, also improved.  IMPRESSION: Improving right effusion and right base atelectasis. No pneumothorax following thoracentesis.   Electronically Signed   By: Charlett Nose M.D.   On: 01/29/2015 09:54   Dg Chest 2 View  02/23/2015   CLINICAL DATA:  Productive cough.  EXAM: CHEST  2 VIEW  COMPARISON:  01/29/2015  FINDINGS: Heart size is mildly enlarged. There is pulmonary vascular congestion. The right pleural effusion has increased in size from the previous exam. There is overlying atelectasis and/or airspace consolidation. Thickening along the major fissure is noted.  IMPRESSION: 1. Mild CHF with increase in volume of right pleural effusion.   Electronically Signed   By: Signa Kell M.D.   On: 02/23/2015 14:11   Dg Chest Portable 1 View  01/29/2015   CLINICAL DATA:  Cough for 3 weeks, with bilateral lower extremity swelling. Initial encounter.  EXAM: PORTABLE CHEST - 1 VIEW  COMPARISON:  Chest radiograph performed 09/13/2014  FINDINGS: A moderate right-sided pleural effusion is again noted, with right basilar airspace opacification, concerning for either pneumonia or asymmetric pulmonary edema. More mild left basilar airspace opacity is noted. No pneumothorax is seen.  The cardiomediastinal silhouette is borderline enlarged. No acute osseous abnormalities are identified.  IMPRESSION: Moderate right pleural effusion, with right basilar airspace opacification, concerning for either pneumonia or asymmetric pulmonary edema. More mild left basilar airspace opacity noted. Borderline cardiomegaly.   Electronically Signed   By: Roanna Raider M.D.   On: 01/29/2015 03:29   US Thoracentesis Asp Pleural Space W/img Guide  01/29/2015   INDICATION: Symptomatic Rt sided pleural effusion  EXAM: US THORACENTESIS ASP  PLEURAL SPACE W/IMG GUIDE  COMPARISON:  None.  MEDICATIONS: 10 cc 1% lidocaine  COMPLICATIONS: None immediate  TECHNIQUE: Informed written consent was obtained from the patient after a discussion of the risks, benefits and alternatives to treatment. A timeout was performed prior to the initiation of the procedure.  Initial ultrasound scanning demonstrates a Right pleural effusion. The lower chest was prepped and draped in the usual sterile fashion. 1% lidocaine was used for local anesthesia.  Under direct ultrasound guidance, a 19 gauge, 7-cm, Yueh catheter was introduced. An ultrasound image was saved for documentation purposes. the thoracentesis was performed. The catheter was removed and a dressing was applied. The patient tolerated the procedure well without immediate post procedural complication. The patient was escorted to have an upright chest radiograph.  FINDINGS: A total of approximately 1 liters of yellow fluid was  removed. Requested samples were sent to the laboratory.  IMPRESSION: Successful ultrasound-guided R sided thoracentesis yielding 1 liters of pleural fluid.  Read by:  Robet Leu [facility]   Electronically Signed   By: Simonne Come M.D.   On: 01/29/2015 11:56    ASSESSMENT AND PLAN 1.  Ischemic cardiomyopathy. 2.  Worsening acute on chronic systolic heart failure with shortness of breath and cough 3.  History of anterior STEMI 05/03/14 with early in-stent restenosis 05/09/14 requiring emergent PCI.  At home she has been taking Brilinta only once a day.  She is now taking it correctly twice a day. 4.  Cigarette abuse 5.  Essential hypertension  Disposition: She has diuresed 4 pounds overnight.  She is still volume overloaded.  She is hypokalemic this morning.  Continue IV diuresis.  Replete potassium.  Blood pressure is high and we will start ARB today. Signed, Cassell Clement MD

## 2015-02-25 LAB — BASIC METABOLIC PANEL
Anion gap: 10 (ref 5–15)
BUN: 29 mg/dL — ABNORMAL HIGH (ref 6–23)
CO2: 28 mmol/L (ref 19–32)
Calcium: 8.5 mg/dL (ref 8.4–10.5)
Chloride: 98 mmol/L (ref 96–112)
Creatinine, Ser: 1.58 mg/dL — ABNORMAL HIGH (ref 0.50–1.10)
GFR calc Af Amer: 39 mL/min — ABNORMAL LOW (ref >90)
GFR calc non Af Amer: 34 mL/min — ABNORMAL LOW (ref >90)
Glucose, Bld: 74 mg/dL (ref 70–99)
Potassium: 3 mmol/L — ABNORMAL LOW (ref 3.5–5.1)
Sodium: 136 mmol/L (ref 135–145)

## 2015-02-25 MED ORDER — POTASSIUM CHLORIDE CRYS ER 20 MEQ PO TBCR
40.0000 meq | EXTENDED_RELEASE_TABLET | Freq: Every day | ORAL | Status: DC
  Administered 2015-02-25 – 2015-02-27 (×3): 40 meq via ORAL
  Filled 2015-02-25 (×3): qty 2

## 2015-02-25 MED ORDER — ISOSORBIDE MONONITRATE ER 30 MG PO TB24
30.0000 mg | ORAL_TABLET | Freq: Every day | ORAL | Status: DC
  Administered 2015-02-25 – 2015-02-28 (×4): 30 mg via ORAL
  Filled 2015-02-25 (×4): qty 1

## 2015-02-25 MED ORDER — BENZONATATE 100 MG PO CAPS
200.0000 mg | ORAL_CAPSULE | Freq: Three times a day (TID) | ORAL | Status: DC
  Administered 2015-02-25 – 2015-02-28 (×11): 200 mg via ORAL
  Filled 2015-02-25 (×12): qty 2

## 2015-02-25 MED ORDER — POTASSIUM CHLORIDE CRYS ER 20 MEQ PO TBCR
40.0000 meq | EXTENDED_RELEASE_TABLET | Freq: Once | ORAL | Status: AC
  Administered 2015-02-25: 40 meq via ORAL
  Filled 2015-02-25: qty 2

## 2015-02-25 NOTE — Care Management Note (Addendum)
    Page 1 of 2   02/28/2015     3:13:17 PM CARE MANAGEMENT NOTE 02/28/2015  Patient:  [facility]   Account Number:  [id number]  Date Initiated:  02/25/2015  Documentation initiated by:  Yaritzy Huser  Subjective/Objective Assessment:   Pt adm on 02/23/15 with CHF exacerbation.  PTA, pt independent, lives at home alone.     Action/Plan:   Will follow for dc needs as pt progresses.   Anticipated DC Date:  02/28/2015   Anticipated DC Plan:  HOME W HOME HEALTH SERVICES      DC Planning Services  CM consult      [facility] Choice  HOME HEALTH   Choice offered to / List presented to:  C-1 Patient        HH arranged  HH-1 RN  HH-10 DISEASE MANAGEMENT      HH agency  [facility]   Status of service:  Completed, signed off Medicare Important Message given?  YES (If response is "NO", the following Medicare IM given date fields will be blank) Date Medicare IM given:  02/26/2015 Medicare IM given by:  Ashlyn Cabler Date Additional Medicare IM given:   Additional Medicare IM given by:    Discharge Disposition:  HOME W HOME HEALTH SERVICES  Per UR Regulation:  Reviewed for med. necessity/level of care/duration of stay  If discussed at Long Length of Stay Meetings, dates discussed:   02/28/2015    Comments:  02/28/15 Sidney Ace, RN, BSN [phone] pt will be added to [facility] (High Risk Initiative) program through [facility] agency to help prevent readmission.  Notified [facility] of dc home today.  02/26/15 Sidney Ace, RN, BSN [phone] Pt would benefit from [facility] for CHF follow up, and pt agreeable.  She would like [facility] for [facility] services.  Referral to [facility] for [facility] needs; start of care 24-48h post dc date.

## 2015-02-25 NOTE — Plan of Care (Signed)
Problem: Phase I Progression Outcomes Goal: EF % per last Echo/documented,Core Reminder form on chart Outcome: Completed/Met Date Met:  02/25/15 EF 20%(01-30-15)

## 2015-02-25 NOTE — Progress Notes (Addendum)
[facility] PROGRESS NOTE  Mykela Mewborn CWC:[id number] DOB: [birth year] DOA: 02/23/2015 PCP: [name], PA-C  Brief narrative 64 year old female with history of ischemic cardiomyopathy with EF of 20%, coronary artery disease with history of anterior STEMI in June 2015 with early in-stent restenosis requiring emergent PCI,  hypertension, dyslipidemia, ongoing tobacco use recently hospitalized for CHF exacerbation presented with 2 day history of productive cough with shortness of breath, orthopnea and weight gain of almost 5 kg. Patient admitted for acute on chronic systolic CHF.   Assessment/Plan: Acute on chronic combined systolic and diastolic CHF  continue IV lasix. diuresing well. Weight 193>>187 lbs. Still up by 4 lbs from baseline.   Continue metolazone, coreg and hydralazine. Given worsening renal function will hold ARB. Add Imdur. Replace low k and mg Monitor strict I/O and daily weights.  appreciate cardiology follow up.  CAD with hx of STEMI s/p PCI On brillinta. Continue coreg, statin and  ARB. Documented as allergy to children aspirin but has been tolerating baby aspirin here.  Acute bronchitis on Z-Pak. Continue antitussives.( will change to scheduled given ongoing cough)  Acute kidney injury  secondary IV diuresis. Hold ARB  Hypokalemia  replenish  DVT prophylaxis: Subcutaneous heparin  Diet: Heart healthy       Code Status: Full code Family Communication: None at bedside Disposition Plan: Home in 1-2 days if clinically improving   Consultants:  Cardiology  Procedures:  None  Antibiotics:  Z-Pak  HPI/Subjective: Patient seen and examined. Dyspnea better. Still has productive cough  Objective: Filed Vitals:   02/25/15 0650  BP: 120/74  Pulse: 78  Temp:   Resp:     Intake/Output Summary (Last 24 hours) at 02/25/15 1335 Last data filed at 02/25/15 0834  Gross per 24 hour  Intake   1280 ml  Output   2001 ml  Net   -721  ml   Filed Weights   02/23/15 1851 02/24/15 0537 02/25/15 0515  Weight: 88.769 kg (195 lb 11.2 oz) 87.045 kg (191 lb 14.4 oz) 85.004 kg (187 lb 6.4 oz)    Exam:   General:no acute distress  HEENT:  moist oral mucosa, supple neck, no JVD  Chest: Fine bibasilar crackles, no rhonchi or wheeze  CVS: Normal S1 and S2, no murmurs rub or gallop  GI: Soft, nondistended, nontender, bowel sounds present  Musculoskeletal: Warm, trace pitting edema bilaterally  CNS: Alert and oriented  Data Reviewed: Basic Metabolic Panel:  Recent Labs Lab 02/23/15 1329 02/24/15 0611 02/25/15 0400  NA 144 140 136  K 3.8 3.3* 3.0*  CL 113* 100 98  CO2 20 28 28   GLUCOSE 152* 100* 74  BUN 14 16 29*  CREATININE 1.10 1.35* 1.58*  CALCIUM 9.4 9.0 8.5  MG  --  1.7  --    Liver Function Tests: No results for input(s): AST, ALT, ALKPHOS, BILITOT, PROT, ALBUMIN in the last 168 hours. No results for input(s): LIPASE, AMYLASE in the last 168 hours. No results for input(s): AMMONIA in the last 168 hours. CBC:  Recent Labs Lab 02/23/15 1329  WBC 9.8  HGB 14.4  HCT 45.2  MCV 88.6  PLT 275   Cardiac Enzymes: No results for input(s): CKTOTAL, CKMB, CKMBINDEX, TROPONINI in the last 168 hours. BNP (last 3 results)  Recent Labs  02/23/15 1530  BNP >4500.0*    ProBNP (last 3 results)  Recent Labs  08/22/14 1226 09/13/14 1854 11/05/14 1143  PROBNP 3059.0* 12687.0* 321.0*    CBG: No results  for input(s): GLUCAP in the last 168 hours.  No results found for this or any previous visit (from the past 240 hour(s)).   Studies: Dg Chest 2 View  02/23/2015   CLINICAL DATA:  Productive cough.  EXAM: CHEST  2 VIEW  COMPARISON:  01/29/2015  FINDINGS: Heart size is mildly enlarged. There is pulmonary vascular congestion. The right pleural effusion has increased in size from the previous exam. There is overlying atelectasis and/or airspace consolidation. Thickening along the major fissure is noted.   IMPRESSION: 1. Mild CHF with increase in volume of right pleural effusion.   Electronically Signed   By: Signa Kell M.D.   On: 02/23/2015 14:11    Scheduled Meds: . aspirin EC  81 mg Oral Daily  . atorvastatin  40 mg Oral Q7 days  . azithromycin  250 mg Oral Daily  . benzonatate  200 mg Oral TID  . carvedilol  6.25 mg Oral BID WC  . docusate sodium  200 mg Oral BID  . furosemide  60 mg Intravenous BID  . heparin subcutaneous  5,000 Units Subcutaneous 3 times per day  . hydrALAZINE  25 mg Oral 3 times per day  . isosorbide mononitrate  30 mg Oral Daily  . potassium chloride SA  40 mEq Oral Daily  . potassium chloride  40 mEq Oral Once  . ticagrelor  90 mg Oral BID   Continuous Infusions:     Time spent: 25 minutes    Eddie North  [facility] Pager [phone] If 7PM-7AM, please contact night-coverage at [email or web address], password [facility] 02/25/2015, 1:35 PM  LOS: 2 days

## 2015-02-25 NOTE — Progress Notes (Signed)
Patient Name: Deborah Ho Date of Encounter: 02/25/2015     Active Problems:   Tobacco abuse   Cardiomyopathy, ischemic, EF 30-35 % Echo 05/04/14.   CAD (coronary artery disease)   Acute on chronic systolic CHF (congestive heart failure)   Essential hypertension   CHF (congestive heart failure)   Acute bronchitis    SUBJECTIVE  Very confused on why she is here again. Says she has been taking all her medicines and has been "doing really well." she still has significant cough that is productive. SOB improved. Still with LE edema.   CURRENT MEDS . aspirin EC  81 mg Oral Daily  . atorvastatin  40 mg Oral Q7 days  . azithromycin  250 mg Oral Daily  . carvedilol  6.25 mg Oral BID WC  . docusate sodium  200 mg Oral BID  . furosemide  60 mg Intravenous BID  . heparin subcutaneous  5,000 Units Subcutaneous 3 times per day  . hydrALAZINE  25 mg Oral 3 times per day  . metolazone  2.5 mg Oral Daily  . nitroGLYCERIN  0.5 inch Topical 4 times per day  . potassium chloride SA  40 mEq Oral Daily  . potassium chloride  40 mEq Oral Once  . ticagrelor  90 mg Oral BID    OBJECTIVE  Filed Vitals:   02/25/15 0148 02/25/15 0300 02/25/15 0515 02/25/15 0650  BP:   104/72 120/74  Pulse:   79 78  Temp: 100.5 F (38.1 C) 99.9 F (37.7 C) 99.5 F (37.5 C)   TempSrc: Oral Oral Oral   Resp:   18   Height:      Weight:   187 lb 6.4 oz (85.004 kg)   SpO2:   96%     Intake/Output Summary (Last 24 hours) at 02/25/15 0858 Last data filed at 02/25/15 0834  Gross per 24 hour  Intake   1760 ml  Output   2101 ml  Net   -341 ml   Filed Weights   02/23/15 1851 02/24/15 0537 02/25/15 0515  Weight: 195 lb 11.2 oz (88.769 kg) 191 lb 14.4 oz (87.045 kg) 187 lb 6.4 oz (85.004 kg)    PHYSICAL EXAM  General: Pleasant, NAD. Neuro: Alert and oriented X 3. Moves all extremities spontaneously. Psych: Normal affect. HEENT:  Normal  Neck: Supple without bruits or JVD. Lungs:  Resp regular and  unlabored, mild crackles at bases.  Heart: RRR no s3, s4, or murmurs. Abdomen: Soft, non-tender, non-distended, BS + x 4.  Extremities: No clubbing, cyanosis. DP/PT/Radials 2+ and equal bilaterally. mild bilateral LE pitting edema.  Accessory Clinical Findings  CBC  Recent Labs  02/23/15 1329  WBC 9.8  HGB 14.4  HCT 45.2  MCV 88.6  PLT 275   Basic Metabolic Panel  Recent Labs  02/24/15 0611 02/25/15 0400  NA 140 136  K 3.3* 3.0*  CL 100 98  CO2 28 28  GLUCOSE 100* 74  BUN 16 29*  CREATININE 1.35* 1.58*  CALCIUM 9.0 8.5  MG 1.7  --    Thyroid Function Tests  Recent Labs  02/24/15 0611  TSH 0.649    TELE  NSR with freq PVCs. Some tachycardia  Radiology/Studies  Dg Chest 1 View  01/29/2015   CLINICAL DATA:  Pleural effusion.  Post right thoracentesis.  EXAM: CHEST  1 VIEW  COMPARISON:  01/29/2015  FINDINGS: Cardiomegaly. Mediastinal contours are within normal missed. Small right pleural effusion, decreased since prior study. No pneumothorax  following thoracentesis. Right basilar opacity likely reflects atelectasis, also improved.  IMPRESSION: Improving right effusion and right base atelectasis. No pneumothorax following thoracentesis.   Electronically Signed   By: Charlett Nose M.D.   On: 01/29/2015 09:54   Dg Chest 2 View  02/23/2015   CLINICAL DATA:  Productive cough.  EXAM: CHEST  2 VIEW  COMPARISON:  01/29/2015  FINDINGS: Heart size is mildly enlarged. There is pulmonary vascular congestion. The right pleural effusion has increased in size from the previous exam. There is overlying atelectasis and/or airspace consolidation. Thickening along the major fissure is noted.  IMPRESSION: 1. Mild CHF with increase in volume of right pleural effusion.   Electronically Signed   By: Signa Kell M.D.   On: 02/23/2015 14:11   Dg Chest Portable 1 View  01/29/2015   CLINICAL DATA:  Cough for 3 weeks, with bilateral lower extremity swelling. Initial encounter.  EXAM: PORTABLE  CHEST - 1 VIEW  COMPARISON:  Chest radiograph performed 09/13/2014  FINDINGS: A moderate right-sided pleural effusion is again noted, with right basilar airspace opacification, concerning for either pneumonia or asymmetric pulmonary edema. More mild left basilar airspace opacity is noted. No pneumothorax is seen.  The cardiomediastinal silhouette is borderline enlarged. No acute osseous abnormalities are identified.  IMPRESSION: Moderate right pleural effusion, with right basilar airspace opacification, concerning for either pneumonia or asymmetric pulmonary edema. More mild left basilar airspace opacity noted. Borderline cardiomegaly.   Electronically Signed   By: Roanna Raider M.D.   On: 01/29/2015 03:29   US Thoracentesis Asp Pleural Space W/img Guide  01/29/2015   INDICATION: Symptomatic Rt sided pleural effusion  EXAM: US THORACENTESIS ASP PLEURAL SPACE W/IMG GUIDE  COMPARISON:  None.  MEDICATIONS: 10 cc 1% lidocaine  COMPLICATIONS: None immediate  TECHNIQUE: Informed written consent was obtained from the patient after a discussion of the risks, benefits and alternatives to treatment. A timeout was performed prior to the initiation of the procedure.  Initial ultrasound scanning demonstrates a Right pleural effusion. The lower chest was prepped and draped in the usual sterile fashion. 1% lidocaine was used for local anesthesia.  Under direct ultrasound guidance, a 19 gauge, 7-cm, Yueh catheter was introduced. An ultrasound image was saved for documentation purposes. the thoracentesis was performed. The catheter was removed and a dressing was applied. The patient tolerated the procedure well without immediate post procedural complication. The patient was escorted to have an upright chest radiograph.  FINDINGS: A total of approximately 1 liters of yellow fluid was removed. Requested samples were sent to the laboratory.  IMPRESSION: Successful ultrasound-guided R sided thoracentesis yielding 1 liters of pleural  fluid.  Read by:  Robet Leu [facility]   Electronically Signed   By: Simonne Come M.D.   On: 01/29/2015 11:56     2D ECHO Study Date: 01/30/2015 LV EF: 20% Study Conclusions - Left ventricle: The cavity size was normal. Wall thickness was   increased in a pattern of mild LVH. The estimated ejection   fraction was 20%. Diffuse hypokinesis. - Mitral valve: There was mild regurgitation. - Left atrium: The atrium was mildly dilated. - Right ventricle: Data suggests some RV dysfunction. Not able to   assess RV function due to poor visualization. Not able to assess   RV size due to poor visualization. - Right atrium: The atrium was mildly dilated. - Pulmonary arteries: PA peak pressure: 41 mm Hg (S).   ASSESSMENT AND PLAN    64 year old female with history of  ischemic cardiomyopathy with EF of 20%, CAD with history of anterior STEMI in 04/2014 with early ISR requiring emergent PCI, hypertension, dyslipidemia, ongoing tobacco use recently hospitalized for CHF exacerbation who was admitted to [facility] on 02/23/15 with 2 day history of productive cough with shortness of breath, orthopnea and weight gain of almost 5 kg. Patient admitted for acute on chronic systolic CHF and acute bronchitis.   Ischemic cardiomyopathy/Acute on chronic systolic CHF-  -- BNP > 4500. CXR with mild CHF with increase in volume of right pleural effusion. Weight up 5 kg. She reports compliance with her diuretic regimen at home. She was seen in the CHF clinic on 02/12/15 and placed on torsemide 20 mg BID as well as PRN metolazone as needed for weight greater than 187 pounds. Goal weight 185 pounds.  -- EF improved from 15% to 35-40% in 10/2014 and ICD felt not to be indicated. Repeat 2D ECHO 01/30/2015 with EF 20%. Repeat 2D ECHO once medications optimized. -- Acute exacerbation likely due to acute bronchitis and some degree of non-compliance ( patient has poor insight into her health problems and medication administration)  -- Upon  admission, she was placed on IV lasix and metolazone 2.5mg  qd. Currently on 60mg  IV Lasix BID. Net output 2.4L. Weight down 7 lbs ( 194--> 187 lbs). She is still volume overloaded with crackles at lung bases and mild bilateral LE edema.  -- Creat slightly bumped since admission 1.35--> 1.58. Continue to monitor -- Continue coreg. ARB held due to AKI on last admission (creat elevated today as well). She is on hydralazine 25mg  TID. Will add nitrate today.   Acute bronchitis- per IM. Still with cough. Now on scheduled antitussives.   Acute kidney injury- Creat slightly bumped since admission 1.35--> 1.58. Continue to monitor in the setting of IV diuresis. Continue to hold ARB and spiro.  CAD- with past anterior MI 04/2014 and PCI with repeat PCI due to missed dose of Brilinta.  -- Previous notes list her as ASA allergic, but she has been receiving it here with no issues. Continue to monitor closely.  --At home she has been taking Brilinta only once a day. She is now taking it correctly twice a day. -- Continue asa for now, brilinta, statin and BB.   Tobacco abuse- counseled on cessation.   Essential hypertension- Blood elevated yesterday and Dr restarted ARB yesterday. This was then discontinued due to increase in creat and hx of AKI on ARB last admission. Continue to hold for now as her pressures are stable.  -- Continue coreg 6.25mg  BID and hydralazine 25mg  TID. Will add nitrates to hydralazine in place of ARB in the setting of chronic systolic CHF.  Hypokalemia- 3.0 today. Replete potassium per IM.   05/2014 PA-C  Pager Patty Sermons   Attending Note:   The patient was seen and examined.  Agree with assessment and plan as noted above.  Changes made to the above note as needed.  Continue Rx for CHF.  Her cough and breathing is much better.  Sill has some leg edema  Would continue hydralazine / nitrates instead of ARB because of renal  dysfunction.    , Billy Fischer., MD, [facility] 02/25/2015, 11:30 AM [address],  [address] Office [phone] Pager [phone]

## 2015-02-26 ENCOUNTER — Inpatient Hospital Stay (HOSPITAL_COMMUNITY): Payer: No Typology Code available for payment source

## 2015-02-26 DIAGNOSIS — Z9114 Patient's other noncompliance with medication regimen: Secondary | ICD-10-CM

## 2015-02-26 DIAGNOSIS — J90 Pleural effusion, not elsewhere classified: Secondary | ICD-10-CM | POA: Insufficient documentation

## 2015-02-26 DIAGNOSIS — J948 Other specified pleural conditions: Secondary | ICD-10-CM

## 2015-02-26 LAB — BASIC METABOLIC PANEL
Anion gap: 11 (ref 5–15)
BUN: 42 mg/dL — ABNORMAL HIGH (ref 6–23)
CO2: 34 mmol/L — ABNORMAL HIGH (ref 19–32)
Calcium: 9 mg/dL (ref 8.4–10.5)
Chloride: 90 mmol/L — ABNORMAL LOW (ref 96–112)
Creatinine, Ser: 1.98 mg/dL — ABNORMAL HIGH (ref 0.50–1.10)
GFR calc Af Amer: 30 mL/min — ABNORMAL LOW (ref >90)
GFR calc non Af Amer: 26 mL/min — ABNORMAL LOW (ref >90)
Glucose, Bld: 96 mg/dL (ref 70–99)
Potassium: 3.4 mmol/L — ABNORMAL LOW (ref 3.5–5.1)
Sodium: 135 mmol/L (ref 135–145)

## 2015-02-26 MED ORDER — TORSEMIDE 20 MG PO TABS
40.0000 mg | ORAL_TABLET | Freq: Every day | ORAL | Status: DC
  Administered 2015-02-27 – 2015-02-28 (×2): 40 mg via ORAL
  Filled 2015-02-26 (×2): qty 2

## 2015-02-26 MED ORDER — TORSEMIDE 20 MG PO TABS
20.0000 mg | ORAL_TABLET | Freq: Every day | ORAL | Status: DC
  Administered 2015-02-28: 20 mg via ORAL
  Filled 2015-02-26 (×2): qty 1

## 2015-02-26 MED ORDER — POTASSIUM CHLORIDE CRYS ER 20 MEQ PO TBCR
40.0000 meq | EXTENDED_RELEASE_TABLET | Freq: Once | ORAL | Status: AC
Start: 2015-02-26 — End: 2015-02-26
  Administered 2015-02-26: 40 meq via ORAL
  Filled 2015-02-26: qty 2

## 2015-02-26 MED ORDER — TORSEMIDE 20 MG PO TABS
20.0000 mg | ORAL_TABLET | Freq: Every day | ORAL | Status: DC
  Filled 2015-02-26: qty 1

## 2015-02-26 NOTE — Progress Notes (Signed)
Patient refused hydralazine this afternoon. Her bp is 99/71 and she doesn't feel well with her blood pressure this low. Will continue to monitor

## 2015-02-26 NOTE — Progress Notes (Addendum)
CARDIAC REHAB PHASE I   Pt refused ambulation, states she is "nauseated and her stomach is upset."  Pt with limited insight re heart failure and noncompliance.  Pt states she is here for "cough" and that her cough is not related to her "fluid".  She states the "doctor found fluid on her lungs" and gave her antibiotics. Pt states she does not take her BP medicine because her blood pressure is too low. Pt also states her weight decrease is related to her "stool" and taking stool softeners.  Dr. Gala Romney at bedside, discussed worsening heart failure, fluid restriction compliance, weight and medication compliance. Pt verbalized understanding. Gave heart failure book, sodium restriction diet handouts and reviewed materials. Pt encouraged to ambulate with nursing staff. Will follow pt.   [id number]  Joylene Grapes, RN, BSN 02/26/2015 2:37 PM

## 2015-02-26 NOTE — Progress Notes (Signed)
Advanced Heart Failure Rounding Note   Subjective:     Admitted with volume overload. Diuresing with IV lasix. Overall she has diuresed 12 pounds.  Day 2 of zithromax for suspected sinusitis. Cough improved but does have cough in bed.   Denies SOB.   Creatinine 1.3>1.58>1.9  Objective:   Weight Range:  Vital Signs:   Temp:  [97.5 F (36.4 C)-99.7 F (37.6 C)] 97.5 F (36.4 C) (04/12 0532) Pulse Rate:  [81-85] 83 (04/12 0532) Resp:  [16-18] 16 (04/12 0532) BP: (101-118)/(59-68) 103/62 mmHg (04/12 0844) SpO2:  [97 %-98 %] 98 % (04/12 0532) Weight:  [183 lb (83.008 kg)] 183 lb (83.008 kg) (04/12 0532) Last BM Date: 02/25/15  Weight change: Filed Weights   02/24/15 0537 02/25/15 0515 02/26/15 0532  Weight: 191 lb 14.4 oz (87.045 kg) 187 lb 6.4 oz (85.004 kg) 183 lb (83.008 kg)    Intake/Output:   Intake/Output Summary (Last 24 hours) at 02/26/15 0950 Last data filed at 02/26/15 0538  Gross per 24 hour  Intake   1553 ml  Output   2450 ml  Net   -897 ml     Physical Exam: General: Sitting on the side of the bed.  No resp difficulty HEENT: normal Neck: supple. JVP flat. Carotids 2+ bilat; no bruits. No lymphadenopathy or thryomegaly appreciated. Cor: PMI nondisplaced. Regular rate & rhythm. 2/6 MR no s3 Lungs: clear x decreased at R base Abdomen: soft, nontender, nondistended. No hepatosplenomegaly. No bruits or masses. Good bowel sounds. Extremities: no cyanosis, clubbing, rash,  edema Neuro: alert & orientedx3, cranial nerves grossly intact. moves all 4 extremities w/o difficulty. Affect pleasant  Telemetry: SR 80s   Labs: Basic Metabolic Panel:  Recent Labs Lab 02/23/15 1329 02/24/15 0611 02/25/15 0400 02/26/15 0535  NA 144 140 136 135  K 3.8 3.3* 3.0* 3.4*  CL 113* 100 98 90*  CO2 20 28 28  34*  GLUCOSE 152* 100* 74 96  BUN 14 16 29* 42*  CREATININE 1.10 1.35* 1.58* 1.98*  CALCIUM 9.4 9.0 8.5 9.0  MG  --  1.7  --   --     Liver Function  Tests: No results for input(s): AST, ALT, ALKPHOS, BILITOT, PROT, ALBUMIN in the last 168 hours. No results for input(s): LIPASE, AMYLASE in the last 168 hours. No results for input(s): AMMONIA in the last 168 hours.  CBC:  Recent Labs Lab 02/23/15 1329  WBC 9.8  HGB 14.4  HCT 45.2  MCV 88.6  PLT 275    Cardiac Enzymes: No results for input(s): CKTOTAL, CKMB, CKMBINDEX, TROPONINI in the last 168 hours.  BNP: BNP (last 3 results)  Recent Labs  02/23/15 1530  BNP >4500.0*    ProBNP (last 3 results)  Recent Labs  08/22/14 1226 09/13/14 1854 11/05/14 1143  PROBNP 3059.0* 12687.0* 321.0*      Other results:  Imaging:  No results found.   Medications:     Scheduled Medications: . aspirin EC  81 mg Oral Daily  . atorvastatin  40 mg Oral Q7 days  . azithromycin  250 mg Oral Daily  . benzonatate  200 mg Oral TID  . carvedilol  6.25 mg Oral BID WC  . docusate sodium  200 mg Oral BID  . furosemide  60 mg Intravenous BID  . heparin subcutaneous  5,000 Units Subcutaneous 3 times per day  . hydrALAZINE  25 mg Oral 3 times per day  . isosorbide mononitrate  30 mg Oral Daily  .  potassium chloride SA  40 mEq Oral Daily  . ticagrelor  90 mg Oral BID     Infusions:     PRN Medications:  acetaminophen, albuterol, hydrALAZINE, nitroGLYCERIN, ondansetron (ZOFRAN) IV   Assessment:   1. Acute on chronic systolic HF 2. Ischemic CM EF 35% by echo 12/15 -> 20% 01/30/15 3. R pleural effusion  --s/p thoracentesis 01/29/15 (transudative) 4. CAD s/p anterior MI 5. Dietary non compliance 6. Acute respiratory failure, improved 7. Hypokalemia 8. Sinusitis.  9. AKI  Plan/Discussion:    Today she appears euvolemic. Creatinine bump noted. Stop IV lasix and will start po diuretics tomorrow. Increase home diuretic regimen to Torsemide 40 mg in am and continue 20 mg in pm. Supplement potassium. Continue current dose of carvedilol, hydralazine, and.imdur. No Ace/spir  with elevated creatinine.   Consult cardiac rehab. Follow up in HF clinic has been set up.    Length of Stay: 3  CLEGG,AMY NP-C  02/26/2015, 9:50 AM  Advanced Heart Failure Team Pager [phone] (M-F; 7a - 4p)  Please contact [facility] for night-coverage after hours (4p -7a ) and weekends on [email or web address]   Patient seen and examined with Tonye Becket, NP. We discussed all aspects of the encounter. I agree with the assessment and plan as stated above.   Volume status looks good today and probably a bit over diuresed. Agree with stopping IV lasix and restart po torsemide tomorrow or Thursday. Will recheck CXR and see if R effusion decreased with diuresis. If not may need Pleurex. I continued to be impressed by her lack of insight into her disease process. Additionally, her thought process seems quite tangential. Would consider having psych see to get a better understanding of her mental capacity and her ability to care for her HF. D/w Dr. Gonzella Lex.   Tradarius Reinwald,MD 6:01 PM

## 2015-02-26 NOTE — Progress Notes (Signed)
Patient is very well known from previous admissions.  She is being seen by [employer] who have said that she reports that she only takes some medications "when she feels like she needs them".  Nursing staff during this hospitalization also report that patient has been refusing certain medications due to her BP being "too low and it makes me feel bad".  She refused Coreg this am and Hydralazine last night and this am.  She has been counseled regarding taking all medications as prescribed and how important they are for her heart.  She has follow-up scheduled in the AHF clinic on April 4/20 at 1040am.

## 2015-02-26 NOTE — Progress Notes (Signed)
[facility] PROGRESS NOTE  Deborah Ho TFT:[id number] DOB: 1951-06-29 DOA: 02/23/2015 PCP: [name], PA-C  Brief narrative 64 year old female with history of ischemic cardiomyopathy with EF of 20%, coronary artery disease with history of anterior STEMI in June 2015 with early in-stent restenosis requiring emergent PCI,  hypertension, dyslipidemia, ongoing tobacco use recently hospitalized for CHF exacerbation presented with 2 day history of productive cough with shortness of breath, orthopnea and weight gain of almost 5 kg. Patient admitted for acute on chronic systolic CHF.   Assessment/Plan: Acute on chronic combined systolic and diastolic CHF In the seting of medication non adherence wth frequent hospitalization or CHF.  Marland Kitchen Diuresed well with IV lasix . Weight 193>>183 lbs ( around baseline) Non adherent to meds . Pt has very poor understanding of disease process. D/w cardiologist. will ask psych to assess capacity. Hold IV diuretics today with plan to resume oral torsemide 40 mg am and 20 mg pm  Continue coreg and hydralazine. Added imdur. Given worsening renal function holding ARB.  Replace low k and mg Monitor strict I/O and daily weights.  appreciate Heart failure team follow up.  CAD with hx of STEMI s/p PCI On brillinta. Continue coreg, statin and  ARB. Added baby aspirin. Documented as allergy to children aspirin but has been tolerating baby aspirin here.  Acute bronchitis on Z-Pak. Continue antitussives.  Acute kidney injury  secondary IV diuresis. Holding ARB  Hypokalemia  replenish  DVT prophylaxis: Subcutaneous heparin  Diet: Heart healthy       Code Status: Full code Family Communication: None at bedside Disposition Plan: Home possibly tomorrow if diuressing well. psych consult to evaluate for capacity in am   Consultants:  Cardiology  Procedures:  None  Antibiotics:  Z-Pak  HPI/Subjective: Patient seen and examined. Cough  and dyspnea better. Pt not adherent to most of her meds at home.  Objective: Filed Vitals:   02/26/15 1021  BP: 103/69  Pulse: 88  Temp:   Resp:     Intake/Output Summary (Last 24 hours) at 02/26/15 1413 Last data filed at 02/26/15 1006  Gross per 24 hour  Intake   1433 ml  Output   1650 ml  Net   -217 ml   Filed Weights   02/24/15 0537 02/25/15 0515 02/26/15 0532  Weight: 87.045 kg (191 lb 14.4 oz) 85.004 kg (187 lb 6.4 oz) 83.008 kg (183 lb)    Exam:   General:no acute distress  HEENT:  moist oral mucosa, supple neck, no JVD  Chest: clear b/l, no rhonchi or wheeze  CVS: Normal S1 and S2, no murmurs rub or gallop  GI: Soft, nondistended, nontender, bowel sounds present  Musculoskeletal: Warm, trace pitting edema bilaterally  CNS: Alert and oriented  Data Reviewed: Basic Metabolic Panel:  Recent Labs Lab 02/23/15 1329 02/24/15 0611 02/25/15 0400 02/26/15 0535  NA 144 140 136 135  K 3.8 3.3* 3.0* 3.4*  CL 113* 100 98 90*  CO2 20 28 28  34*  GLUCOSE 152* 100* 74 96  BUN 14 16 29* 42*  CREATININE 1.10 1.35* 1.58* 1.98*  CALCIUM 9.4 9.0 8.5 9.0  MG  --  1.7  --   --    Liver Function Tests: No results for input(s): AST, ALT, ALKPHOS, BILITOT, PROT, ALBUMIN in the last 168 hours. No results for input(s): LIPASE, AMYLASE in the last 168 hours. No results for input(s): AMMONIA in the last 168 hours. CBC:  Recent Labs Lab 02/23/15 1329  WBC 9.8  HGB 14.4  HCT 45.2  MCV 88.6  PLT 275   Cardiac Enzymes: No results for input(s): CKTOTAL, CKMB, CKMBINDEX, TROPONINI in the last 168 hours. BNP (last 3 results)  Recent Labs  02/23/15 1530  BNP >4500.0*    ProBNP (last 3 results)  Recent Labs  08/22/14 1226 09/13/14 1854 11/05/14 1143  PROBNP 3059.0* 12687.0* 321.0*    CBG: No results for input(s): GLUCAP in the last 168 hours.  No results found for this or any previous visit (from the past 240 hour(s)).   Studies: No results  found.  Scheduled Meds: . aspirin EC  81 mg Oral Daily  . atorvastatin  40 mg Oral Q7 days  . azithromycin  250 mg Oral Daily  . benzonatate  200 mg Oral TID  . carvedilol  6.25 mg Oral BID WC  . docusate sodium  200 mg Oral BID  . heparin subcutaneous  5,000 Units Subcutaneous 3 times per day  . hydrALAZINE  25 mg Oral 3 times per day  . isosorbide mononitrate  30 mg Oral Daily  . potassium chloride SA  40 mEq Oral Daily  . ticagrelor  90 mg Oral BID  . [START ON 02/27/2015] torsemide  20 mg Oral q1800  . [START ON 02/27/2015] torsemide  40 mg Oral Daily   Continuous Infusions:     Time spent: 25 minutes    Ara Mano  [facility] Pager [phone] If 7PM-7AM, please contact night-coverage at [email or web address], password [facility] 02/26/2015, 2:13 PM  LOS: 3 days

## 2015-02-26 NOTE — Progress Notes (Signed)
Patient refused coreg this am. Blood pressure was 103/62, she stated it was too low for another medication. Instructed patient on need for the medication but didn't want to take at this time.

## 2015-02-27 ENCOUNTER — Encounter (HOSPITAL_COMMUNITY): Payer: No Typology Code available for payment source

## 2015-02-27 DIAGNOSIS — I5021 Acute systolic (congestive) heart failure: Secondary | ICD-10-CM | POA: Insufficient documentation

## 2015-02-27 DIAGNOSIS — F4322 Adjustment disorder with anxiety: Secondary | ICD-10-CM

## 2015-02-27 LAB — BASIC METABOLIC PANEL
Anion gap: 12 (ref 5–15)
BUN: 49 mg/dL — ABNORMAL HIGH (ref 6–23)
CO2: 27 mmol/L (ref 19–32)
Calcium: 9 mg/dL (ref 8.4–10.5)
Chloride: 95 mmol/L — ABNORMAL LOW (ref 96–112)
Creatinine, Ser: 1.56 mg/dL — ABNORMAL HIGH (ref 0.50–1.10)
GFR calc Af Amer: 40 mL/min — ABNORMAL LOW (ref >90)
GFR calc non Af Amer: 34 mL/min — ABNORMAL LOW (ref >90)
Glucose, Bld: 97 mg/dL (ref 70–99)
Potassium: 3.7 mmol/L (ref 3.5–5.1)
Sodium: 134 mmol/L — ABNORMAL LOW (ref 135–145)

## 2015-02-27 NOTE — Progress Notes (Addendum)
Advanced Heart Failure Rounding Note   Subjective:     Admitted with volume overload. Diuresing with IV lasix. Overall she has diuresed 12 pounds.  Day 3 of zithromax for suspected sinusitis. Cough improved but does have cough in bed. Weight down 1 pound.  Denies SOB.   Creatinine 1.3>1.58>1.9>1.56    Objective:   Weight Range:  Vital Signs:   Temp:  [98.9 F (37.2 C)-99 F (37.2 C)] 98.9 F (37.2 C) (04/13 0506) Pulse Rate:  [79-86] 81 (04/13 0506) Resp:  [16] 16 (04/13 1047) BP: (99-123)/(61-78) 119/61 mmHg (04/13 1047) SpO2:  [98 %-100 %] 100 % (04/13 1047) Weight:  [182 lb 9.6 oz (82.827 kg)] 182 lb 9.6 oz (82.827 kg) (04/13 0506) Last BM Date: 02/26/15  Weight change: Filed Weights   02/25/15 0515 02/26/15 0532 02/27/15 0506  Weight: 187 lb 6.4 oz (85.004 kg) 183 lb (83.008 kg) 182 lb 9.6 oz (82.827 kg)    Intake/Output:   Intake/Output Summary (Last 24 hours) at 02/27/15 1112 Last data filed at 02/27/15 0914  Gross per 24 hour  Intake    800 ml  Output    850 ml  Net    -50 ml     Physical Exam: General: Sitting on the side of the bed.  No resp difficulty HEENT: normal Neck: supple. JVP flat. Carotids 2+ bilat; no bruits. No lymphadenopathy or thryomegaly appreciated. Cor: PMI nondisplaced. Regular rate & rhythm. 2/6 MR no s3 Lungs: clear x decreased at R base Abdomen: soft, nontender, nondistended. No hepatosplenomegaly. No bruits or masses. Good bowel sounds. Extremities: no cyanosis, clubbing, rash,  edema Neuro: alert & orientedx3, cranial nerves grossly intact. moves all 4 extremities w/o difficulty. Affect pleasant  Telemetry: SR 80s   Labs: Basic Metabolic Panel:  Recent Labs Lab 02/23/15 1329 02/24/15 0611 02/25/15 0400 02/26/15 0535  NA 144 140 136 135  K 3.8 3.3* 3.0* 3.4*  CL 113* 100 98 90*  CO2 20 28 28  34*  GLUCOSE 152* 100* 74 96  BUN 14 16 29* 42*  CREATININE 1.10 1.35* 1.58* 1.98*  CALCIUM 9.4 9.0 8.5 9.0  MG  --  1.7  --    --     Liver Function Tests: No results for input(s): AST, ALT, ALKPHOS, BILITOT, PROT, ALBUMIN in the last 168 hours. No results for input(s): LIPASE, AMYLASE in the last 168 hours. No results for input(s): AMMONIA in the last 168 hours.  CBC:  Recent Labs Lab 02/23/15 1329  WBC 9.8  HGB 14.4  HCT 45.2  MCV 88.6  PLT 275    Cardiac Enzymes: No results for input(s): CKTOTAL, CKMB, CKMBINDEX, TROPONINI in the last 168 hours.  BNP: BNP (last 3 results)  Recent Labs  02/23/15 1530  BNP >4500.0*    ProBNP (last 3 results)  Recent Labs  08/22/14 1226 09/13/14 1854 11/05/14 1143  PROBNP 3059.0* 12687.0* 321.0*      Other results:  Imaging: Dg Chest 2 View  02/26/2015   CLINICAL DATA:  Shortness of breath and cough for 2 days  EXAM: CHEST  2 VIEW  COMPARISON:  02/23/2015  FINDINGS: Chronic cardiomegaly and aortic tortuosity.  The hila are negative.  Interval resolution of right-sided pleural effusion and atelectasis. There is no edema, consolidation, effusion, or pneumothorax.  IMPRESSION: 1. No active cardiopulmonary disease. 2. Pleural effusion noted 02/23/2015 has resolved.   Electronically Signed   By: 04/25/2015 M.D.   On: 02/26/2015 21:36     Medications:  Scheduled Medications: . aspirin EC  81 mg Oral Daily  . atorvastatin  40 mg Oral Q7 days  . azithromycin  250 mg Oral Daily  . benzonatate  200 mg Oral TID  . carvedilol  6.25 mg Oral BID WC  . docusate sodium  200 mg Oral BID  . heparin subcutaneous  5,000 Units Subcutaneous 3 times per day  . hydrALAZINE  25 mg Oral 3 times per day  . isosorbide mononitrate  30 mg Oral Daily  . potassium chloride SA  40 mEq Oral Daily  . ticagrelor  90 mg Oral BID  . torsemide  20 mg Oral q1800  . torsemide  40 mg Oral Daily    Infusions:    PRN Medications: acetaminophen, albuterol, hydrALAZINE, nitroGLYCERIN, ondansetron (ZOFRAN) IV   Assessment:   1. Acute on chronic systolic HF 2.  Ischemic CM EF 35% by echo 12/15 -> 20% 01/30/15 3. R pleural effusion  --s/p thoracentesis 01/29/15 (transudative) 4. CAD s/p anterior MI 5. Dietary non compliance 6. Acute respiratory failure, improved 7. Hypokalemia 8. Sinusitis.  9. AKI  Plan/Discussion:    Today she appears euvolemic. Renal function much improved. Today will restart torsemide 40 mg in am and 20 mg in pm.   Continue current dose of carvedilol, hydralazine, and.imdur. No Ace/spir with elevated creatinine.     Follow up in HF clinic has been set up. Paramedicine to follow in the community.    Length of Stay: 4  CLEGG,AMY NP-C  02/27/2015, 11:12 AM  Advanced Heart Failure Team Pager [phone] (M-F; 7a - 4p)  Please contact [facility] for night-coverage after hours (4p -7a ) and weekends on [email or web address]   Patient seen and examined with Tonye Becket, NP. We discussed all aspects of the encounter. I agree with the assessment and plan as stated above.   Volume status looks good. Renal function improved. Agree with restarting oral torsemide. Await psych eval. May benefit from Cardiomems device if she will consent to it. CXR reviewed personally and R effusion has resolved with diuresis.   Shawan Corella,MD 1:53 PM

## 2015-02-27 NOTE — Consult Note (Signed)
[facility] Face-to-Face Psychiatry Consult   Reason for Consult:  Capacity evaluation and CHF Referring Physician:  Dr. Wyline Copas Patient Identification: Deborah Ho MRN:  [id number] Principal Diagnosis: <principal problem not specified> Diagnosis:   Patient Active Problem List   Diagnosis Date Noted  . Pleural effusion on right [J94.8]   . Acute bronchitis [J20.9]   . CHF (congestive heart failure) [I50.9] 02/23/2015  . Bilateral leg edema [R60.0]   . Essential hypertension [I10]   . Pleural effusion, right [J94.8]   . Pleural effusion [J90]   . CAP (community acquired pneumonia) [J18.9] 01/29/2015  . Pneumonia, community acquired [J18.9] 01/29/2015  . Systolic CHF, acute on chronic [I50.23] 01/29/2015  . Hypokalemia [E87.6] 09/17/2014  . Acute on chronic systolic CHF (congestive heart failure) [I50.23] 09/14/2014  . Chronic systolic heart failure [W09.81] 08/22/2014  . Angioedema of lips [T78.3XXA] 08/17/2014  . CAD (coronary artery disease) [I25.10]   . Acute systolic CHF (congestive heart failure), NYHA class 3 [I50.21] 08/13/2014  . Ischemic cardiomyopathy [I25.5] 08/13/2014  . Acute systolic heart failure [X91.47] 08/13/2014  . Coronary atherosclerosis of native coronary artery [I25.10] 05/10/2014  . STEMI (ST elevation myocardial infarction) [I21.3] 05/09/2014  . HLD (hyperlipidemia) [E78.5] 05/06/2014  . Tobacco abuse [Z72.0] 05/06/2014  . Acute kidney injury [N17.9] 05/06/2014  . HTN (hypertension) [I10] 05/06/2014  . Cardiomyopathy, ischemic, EF 30-35 % Echo 05/04/14. [I25.5] 05/06/2014  . Elevated LFTs [R79.89] 05/06/2014  . Acute systolic CHF (congestive heart failure) [I50.21] 05/06/2014  . ST elevation myocardial infarction (STEMI) of anterior wall [I21.09] 05/03/2014    Total Time spent with patient: 45 minutes  Subjective:   Carly Sabo is a 64 y.o. female patient admitted with productive cough with shortness of breath, orthopnea and weight gain.  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 Patient reported she has been in and out of the hospitals for the last one year and also had history of acute myocardial infarction and stent placement. Patient reported she was relocated from [facility] to [facility] in 2012 and does not have a primary care physician. Ration complains that she was not received primary care physician and requested for PCP so that she does not want to come to the emergency department. Patient stated that she understands clearly about her health problems and treatment needs. Patient stated she raised 2 children who was grownup and has limited contact with them and she lives by herself. Patient denies current symptoms of depression, anxiety, psychosis, suicidal/homicidal ideation, intention or plan. Patient denies history of inpatient or outpatient mental health treatment. Patient denies family history of mental health treatment and cardiac problems. Patient stated her mom is 88 years old and doing fine.  Medical history: 64 year old female with history of ischemic cardiomyopathy with EF of 20%, coronary artery disease with history of anterior STEMI in June 2015 with early in-stent restenosis requiring emergent PCI, hypertension, dyslipidemia, ongoing tobacco use recently hospitalized for CHF exacerbation presented with 2 day history of productive cough with shortness of breath, orthopnea and weight gain of almost 5 kg. Patient admitted for acute on chronic systolic CHF.    Past Medical History:  Past Medical History  Diagnosis Date  . GERD (gastroesophageal reflux disease)     Probable  . CAD (coronary artery disease) 05/03/14; 05/09/14    a. anterior STEMI with early in-stent thorombosis  for missed dose of Brillinta s/p PCI with DESx 2 into LAD (04/2014)  . HTN (hypertension)    . Hyperlipidemia   . Tobacco use   . Non compliance w medication regimen   . Ischemic cardiomyopathy     a. 04/2014 ECHO with EF 45-50% b.  Repeat 2D echo 08/14/14 with EF down at 15%. Life vest placed    Past Surgical History  Procedure Laterality Date  . Partial hysterectomy    . Cholecystectomy    . Coronary angioplasty with stent placement  05/03/14    STEMI- stent to LAD DES- Xience alpine  . Coronary angioplasty with stent placement  05/09/14    STEMI- overlapping stent to LAD, pt had missed a dose of Brilinta  . Left heart catheterization with coronary angiogram N/A 05/03/2014    Procedure: LEFT HEART CATHETERIZATION WITH CORONARY ANGIOGRAM;  Surgeon: Peter M Martinique, MD;  Location: [facility] CATH LAB;  Service: Cardiovascular;  Laterality: N/A;  . Percutaneous stent intervention  05/03/2014    Procedure: PERCUTANEOUS STENT INTERVENTION;  Surgeon: Peter M Martinique, MD;  Location: [facility] CATH LAB;  Service: Cardiovascular;;  DES Prox LAD   . Left heart catheterization with coronary angiogram N/A 05/09/2014    Procedure: LEFT HEART CATHETERIZATION WITH CORONARY ANGIOGRAM;  Surgeon: [name], MD;  Location: [facility] CATH LAB;  Service: Cardiovascular;  Laterality: N/A;   Family History:  Family History  Problem Relation Age of Onset  . Diabetes Mother   . Hypertension Mother    Social History:  History  Alcohol Use No     History  Drug Use No    History   Social History  . Marital Status: Widowed    Spouse Name: N/A  . Number of Children: N/A  . Years of Education: N/A   Occupational History  . not employed    Social History Main Topics  . Smoking status: Current Every Day Smoker -- 0.10 packs/day for .5 years    Types: Cigarettes  . Smokeless tobacco: Current User     Comment: down to 3 cigarettes daily (08/03/14)  . Alcohol Use: No  . Drug Use: No  . Sexual Activity: Not on file   Other Topics Concern  . None   Social History Narrative   Patient has 6 brothers and  sisters and none have known coronary artery disease. She lives alone in [place], but has several brothers in the area.   Additional Social History:                          Allergies:   Allergies  Allergen Reactions  . Aspirin Swelling    Chewable children's aspirin makes patients tongue and face swell  . Effient [Prasugrel] Swelling    Patient's tongue and face swells  . Lactose Intolerance (Gi) Other (See Comments)    REACTION: stomach upset  . Robitussin Dm [Guaifenesin-Dm] Swelling    Patient's tongue swells  . Sulfa Antibiotics Swelling  . Wheat Bran Other (See Comments)    REACTION: unknown    Labs:  Results for orders placed or performed during the hospital encounter of 02/23/15 (from the past 48 hour(s))  Basic metabolic panel     Status: Abnormal   Collection Time: 02/26/15  5:35 AM  Result Value Ref Range   Sodium 135 135 - 145 mmol/L   Potassium 3.4 (L) 3.5 - 5.1 mmol/L   Chloride 90 (L) 96 - 112 mmol/L  Comment: DELTA CHECK NOTED   CO2 34 (H) 19 - 32 mmol/L   Glucose, Bld 96 70 - 99 mg/dL   BUN 42 (H) 6 - 23 mg/dL   Creatinine, Ser 1.98 (H) 0.50 - 1.10 mg/dL   Calcium 9.0 8.4 - 10.5 mg/dL   GFR calc non Af Amer 26 (L) >90 mL/min   GFR calc Af Amer 30 (L) >90 mL/min    Comment: (NOTE) The eGFR has been calculated using the CKD EPI equation. This calculation has not been validated in all clinical situations. eGFR's persistently <90 mL/min signify possible Chronic Kidney Disease.    Anion gap 11 5 - 15    Vitals: Blood pressure 122/74, pulse 81, temperature 98.9 F (37.2 C), temperature source Oral, resp. rate 16, height 5' 5.5" (1.664 m), weight 82.827 kg (182 lb 9.6 oz), SpO2 99 %.  Risk to Self: Is patient at risk for suicide?: No Risk to Others:   Prior Inpatient Therapy:   Prior Outpatient Therapy:    Current Facility-Administered Medications  Medication Dose Route Frequency Provider Last Rate Last Dose  . acetaminophen (TYLENOL)  tablet 650 mg  650 mg Oral Q6H PRN Thurnell Lose, MD   650 mg at 02/26/15 0234  . albuterol (PROVENTIL) (2.5 MG/3ML) 0.083% nebulizer solution 2.5 mg  2.5 mg Nebulization Q4H PRN Thurnell Lose, MD      . aspirin EC tablet 81 mg  81 mg Oral Daily Thurnell Lose, MD   81 mg at 02/26/15 1020  . atorvastatin (LIPITOR) tablet 40 mg  40 mg Oral Q7 days Thurnell Lose, MD   40 mg at 02/25/15 1903  . azithromycin (ZITHROMAX) tablet 250 mg  250 mg Oral Daily Nishant Dhungel, MD   250 mg at 02/26/15 1211  . benzonatate (TESSALON) capsule 200 mg  200 mg Oral TID Nishant Dhungel, MD   200 mg at 02/26/15 2142  . carvedilol (COREG) tablet 6.25 mg  6.25 mg Oral BID WC Thurnell Lose, MD   6.25 mg at 02/26/15 1701  . docusate sodium (COLACE) capsule 200 mg  200 mg Oral BID Thurnell Lose, MD   200 mg at 02/25/15 1010  . heparin injection 5,000 Units  5,000 Units Subcutaneous 3 times per day Thurnell Lose, MD   5,000 Units at 02/27/15 5686  . hydrALAZINE (APRESOLINE) injection 10 mg  10 mg Intravenous Q6H PRN Thurnell Lose, MD   10 mg at 02/23/15 1822  . hydrALAZINE (APRESOLINE) tablet 25 mg  25 mg Oral 3 times per day Thurnell Lose, MD   25 mg at 02/27/15 1683  . isosorbide mononitrate (IMDUR) 24 hr tablet 30 mg  30 mg Oral Daily Nishant Dhungel, MD   30 mg at 02/26/15 1021  . nitroGLYCERIN (NITROSTAT) SL tablet 0.4 mg  0.4 mg Sublingual Q5 min PRN Thurnell Lose, MD      . ondansetron [facility]) injection 4 mg  4 mg Intravenous Q6H PRN Thurnell Lose, MD      . potassium chloride SA (K-DUR,KLOR-CON) CR tablet 40 mEq  40 mEq Oral Daily Nishant Dhungel, MD   40 mEq at 02/26/15 1022  . ticagrelor (BRILINTA) tablet 90 mg  90 mg Oral BID Jules Husbands, MD   90 mg at 02/26/15 2142  . torsemide (DEMADEX) tablet 20 mg  20 mg Oral q1800 Amy D Clegg, NP      . torsemide (DEMADEX) tablet 40 mg  40 mg Oral Daily  Amy Estrella Deeds, NP        Musculoskeletal: Strength & Muscle Tone: decreased Gait &  Station: unable to stand Patient leans: N/A  Psychiatric Specialty Exam: Physical Exam as per history and physical   ROS anxiety, SOB and cough  Blood pressure 122/74, pulse 81, temperature 98.9 F (37.2 C), temperature source Oral, resp. rate 16, height 5' 5.5" (1.664 m), weight 82.827 kg (182 lb 9.6 oz), SpO2 99 %.Body mass index is 29.91 kg/(m^2).  General Appearance: Casual  Eye Contact::  Good  Speech:  Clear and Coherent  Volume:  Normal  Mood:  Anxious  Affect:  Appropriate and Congruent  Thought Process:  Coherent and Goal Directed  Orientation:  Full (Time, Place, and Person)  Thought Content:  WDL  Suicidal Thoughts:  No  Homicidal Thoughts:  No  Memory:  Immediate;   Good Recent;   Good  Judgement:  Fair  Insight:  Good  Psychomotor Activity:  Decreased  Concentration:  Good  Recall:  Good  Fund of Knowledge:Good  Language: Good  Akathisia:  NA  Handed:  Right  AIMS (if indicated):     Assets:  Communication Skills Desire for Improvement Financial Resources/Insurance Housing Intimacy Leisure Time Resilience Social Support Talents/Skills Transportation  ADL's:  Intact  Cognition: WNL  Sleep:      Medical Decision Making: New problem, with additional work up planned, Review of Psycho-Social Stressors (1), Review or order clinical lab tests (1), Established Problem, Worsening (2), Review or order medicine tests (1), Review of Medication Regimen & Side Effects (2) and Review of New Medication or Change in Dosage (2)  Treatment Plan Summary: Daily contact with patient to assess and evaluate symptoms and progress in treatment and Medication management  Plan: Patient meets capacity to make her own medical decisions and living arrangements Patient does not meet criteria for psychiatric inpatient admission. Supportive therapy provided about ongoing stressors.  Appreciate psychiatric consultation and sign off at this time Please contact [phone] or [phone] if  needs further assistance  Disposition: Patient benefits from brief period of SNF if her symptoms of CRF worsens as she has no support system.   Margeret Stachnik,JANARDHAHA R. 02/27/2015 9:54 AM

## 2015-02-27 NOTE — Progress Notes (Signed)
[facility] PROGRESS NOTE  Quanita Barona IPJ:[id number] DOB: 1951/11/11 DOA: 02/23/2015 PCP: Burnett Kanaris E, PA-C  Assessment/Plan: 1. Acute on chronic combined systolic/diastolic CHF exacerbation 1. Wt 82.8kg<<<88.7kg 2. Cardiology following 3. Cardiology recs to transition to PO torsemide 4. Net neg 2.8L thus far 5. Question pt's ability to understand her disease process. She seem to have the insight, however seems to lack intellegence to fully understand her congestive heart failure and how to cope with it 6. Have consulted Psychiatry for recs/opinion regarding decision-making capacity 2. CAD with hx STEMI 1. Stable 2. No chest pain 3. Acute bronchitis 1. Given z-pak 2. stable 4. AKI 1. Cr improved 2. Diuretic decreased to PO per Cardiology recs 3. Follow renal function 5. Hypokalemia 1. Resolved 2. Cont to follow lytes 6. DVT prophylaxis 1. Heparin subQ  Code Status: Full Family Communication: Pt in room (indicate person spoken with, relationship, and if by phone, the number) Disposition Plan: Possible home in 24-48hrs   Consultants:  Cardiology  Procedures:    Antibiotics:  z-pak 4/11>>>  HPI/Subjective: Eager to go home  Objective: Filed Vitals:   02/26/15 2108 02/27/15 0506 02/27/15 1047 02/27/15 1515  BP: 123/78 122/74 119/61 98/70  Pulse: 79 81  80  Temp: 99 F (37.2 C) 98.9 F (37.2 C)  97.7 F (36.5 C)  TempSrc: Oral Oral  Oral  Resp:   16 18  Height:      Weight:  82.827 kg (182 lb 9.6 oz)    SpO2: 98% 99% 100% 100%    Intake/Output Summary (Last 24 hours) at 02/27/15 1558 Last data filed at 02/27/15 0914  Gross per 24 hour  Intake    560 ml  Output    850 ml  Net   -290 ml   Filed Weights   02/25/15 0515 02/26/15 0532 02/27/15 0506  Weight: 85.004 kg (187 lb 6.4 oz) 83.008 kg (183 lb) 82.827 kg (182 lb 9.6 oz)    Exam:   General:  Awake, in nad  Cardiovascular: regular, s1, s2  Respiratory: normal resp  effort, no wheezing  Abdomen: soft, nondistended  Musculoskeletal: perfused, no clubbing   Data Reviewed: Basic Metabolic Panel:  Recent Labs Lab 02/23/15 1329 02/24/15 0611 02/25/15 0400 02/26/15 0535 02/27/15 1119  NA 144 140 136 135 134*  K 3.8 3.3* 3.0* 3.4* 3.7  CL 113* 100 98 90* 95*  CO2 20 28 28  34* 27  GLUCOSE 152* 100* 74 96 97  BUN 14 16 29* 42* 49*  CREATININE 1.10 1.35* 1.58* 1.98* 1.56*  CALCIUM 9.4 9.0 8.5 9.0 9.0  MG  --  1.7  --   --   --    Liver Function Tests: No results for input(s): AST, ALT, ALKPHOS, BILITOT, PROT, ALBUMIN in the last 168 hours. No results for input(s): LIPASE, AMYLASE in the last 168 hours. No results for input(s): AMMONIA in the last 168 hours. CBC:  Recent Labs Lab 02/23/15 1329  WBC 9.8  HGB 14.4  HCT 45.2  MCV 88.6  PLT 275   Cardiac Enzymes: No results for input(s): CKTOTAL, CKMB, CKMBINDEX, TROPONINI in the last 168 hours. BNP (last 3 results)  Recent Labs  02/23/15 1530  BNP >4500.0*    ProBNP (last 3 results)  Recent Labs  08/22/14 1226 09/13/14 1854 11/05/14 1143  PROBNP 3059.0* 12687.0* 321.0*    CBG: No results for input(s): GLUCAP in the last 168 hours.  No results found for this or any previous visit (from the  past 240 hour(s)).   Studies: Dg Chest 2 View  02/26/2015   CLINICAL DATA:  Shortness of breath and cough for 2 days  EXAM: CHEST  2 VIEW  COMPARISON:  02/23/2015  FINDINGS: Chronic cardiomegaly and aortic tortuosity.  The hila are negative.  Interval resolution of right-sided pleural effusion and atelectasis. There is no edema, consolidation, effusion, or pneumothorax.  IMPRESSION: 1. No active cardiopulmonary disease. 2. Pleural effusion noted 02/23/2015 has resolved.   Electronically Signed   By: Marnee Spring M.D.   On: 02/26/2015 21:36    Scheduled Meds: . aspirin EC  81 mg Oral Daily  . atorvastatin  40 mg Oral Q7 days  . azithromycin  250 mg Oral Daily  . benzonatate  200 mg  Oral TID  . carvedilol  6.25 mg Oral BID WC  . docusate sodium  200 mg Oral BID  . heparin subcutaneous  5,000 Units Subcutaneous 3 times per day  . hydrALAZINE  25 mg Oral 3 times per day  . isosorbide mononitrate  30 mg Oral Daily  . potassium chloride SA  40 mEq Oral Daily  . ticagrelor  90 mg Oral BID  . torsemide  20 mg Oral q1800  . torsemide  40 mg Oral Daily   Continuous Infusions:   Active Problems:   Tobacco abuse   Cardiomyopathy, ischemic, EF 30-35 % Echo 05/04/14.   CAD (coronary artery disease)   Acute on chronic systolic CHF (congestive heart failure)   Essential hypertension   CHF (congestive heart failure)   Acute bronchitis   Pleural effusion on right   Dakiyah Heinke K  [facility] Pager [phone]. If 7PM-7AM, please contact night-coverage at [email or web address], password [facility] 02/27/2015, 3:58 PM  LOS: 4 days

## 2015-02-28 DIAGNOSIS — F4323 Adjustment disorder with mixed anxiety and depressed mood: Secondary | ICD-10-CM | POA: Clinically undetermined

## 2015-02-28 DIAGNOSIS — Z720 Tobacco use: Secondary | ICD-10-CM

## 2015-02-28 LAB — BASIC METABOLIC PANEL
Anion gap: 14 (ref 5–15)
BUN: 54 mg/dL — ABNORMAL HIGH (ref 6–23)
CO2: 30 mmol/L (ref 19–32)
Calcium: 9 mg/dL (ref 8.4–10.5)
Chloride: 93 mmol/L — ABNORMAL LOW (ref 96–112)
Creatinine, Ser: 1.72 mg/dL — ABNORMAL HIGH (ref 0.50–1.10)
GFR calc Af Amer: 35 mL/min — ABNORMAL LOW (ref >90)
GFR calc non Af Amer: 30 mL/min — ABNORMAL LOW (ref >90)
Glucose, Bld: 91 mg/dL (ref 70–99)
Potassium: 3.3 mmol/L — ABNORMAL LOW (ref 3.5–5.1)
Sodium: 137 mmol/L (ref 135–145)

## 2015-02-28 MED ORDER — TORSEMIDE 20 MG PO TABS: 40.0000 mg | ORAL_TABLET | Freq: Every day | ORAL | Status: DC

## 2015-02-28 MED ORDER — POTASSIUM CHLORIDE CRYS ER 20 MEQ PO TBCR: 40.0000 meq | EXTENDED_RELEASE_TABLET | Freq: Every day | ORAL | Status: DC

## 2015-02-28 MED ORDER — TORSEMIDE 20 MG PO TABS: 20.0000 mg | ORAL_TABLET | Freq: Every day | ORAL | Status: DC

## 2015-02-28 MED ORDER — POTASSIUM CHLORIDE CRYS ER 20 MEQ PO TBCR
40.0000 meq | EXTENDED_RELEASE_TABLET | Freq: Two times a day (BID) | ORAL | Status: DC
  Administered 2015-02-28: 40 meq via ORAL

## 2015-02-28 MED ORDER — ISOSORBIDE MONONITRATE ER 30 MG PO TB24: 30.0000 mg | ORAL_TABLET | Freq: Every day | ORAL | Status: DC

## 2015-02-28 NOTE — Discharge Summary (Signed)
Physician Discharge Summary  Deborah Ho WNI:[id number] DOB: [birth year] DOA: 02/23/2015  PCP: [name], PA-C  Admit date: 02/23/2015 Discharge date: 02/28/2015  Time spent: 20 minutes  Recommendations for Outpatient Follow-up:  1. Follow up with PCP as scheduled 2. Follow up with Heart Failure Clinic as scheduled  Discharge Diagnoses:  Active Problems:   Tobacco abuse   Cardiomyopathy, ischemic, EF 30-35 % Echo 05/04/14.   CAD (coronary artery disease)   Acute on chronic systolic CHF (congestive heart failure)   Essential hypertension   CHF (congestive heart failure)   Acute bronchitis   Pleural effusion on right   Acute systolic congestive heart failure   Adjustment disorder with mixed anxiety and depressed mood   Discharge Condition: Improved  Diet recommendation: Heart healthy  Filed Weights   02/26/15 0532 02/27/15 0506 02/28/15 0545  Weight: 83.008 kg (183 lb) 82.827 kg (182 lb 9.6 oz) 82.373 kg (181 lb 9.6 oz)    History of present illness:  Please see admit h and p from 4/9 for details. Briefly, pt presents with acute CHF exacerbation. Pt was admitted for further work up.  Hospital Course:  1. Acute on chronic combined systolic/diastolic CHF exacerbation 1. Wt 82.3kg<<<88.7kg 2. Cardiology had been following 3. Cardiology recommended to transition to PO torsemide 4. Net neg 3.4L 5. Question pt's ability to understand her disease process. She seem to have the insight, however seems to lack intellegence to fully understand her congestive heart failure and how to cope with it 6. Psychiatry was consulted for recs/opinion regarding decision-making capacity. Pt was deemed to have decision making capacity  2. CAD with hx STEMI 1. Stable 2. No chest pain 3. Acute bronchitis 1. Completed Z-pak 2. stable 4. AKI 1. Cr stable 2. Diuretic decreased to PO per Cardiology recs 3. Follow renal function 5. Hypokalemia 1. Resolved 2. Cont to follow  lytes 6. DVT prophylaxis 1. Heparin subQ  Consultations:  Cardiology  Discharge Exam: Filed Vitals:   02/27/15 1718 02/27/15 2004 02/28/15 0545 02/28/15 1129  BP: 107/45 101/70 119/73 115/75  Pulse:  82 84   Temp:  97.6 F (36.4 C) 97.6 F (36.4 C)   TempSrc:  Oral Oral   Resp:  18 18   Height:      Weight:   82.373 kg (181 lb 9.6 oz)   SpO2:  99% 98%     General: Awake, in nad Cardiovascular: regular, s1, s2 Respiratory: normal resp effort, no wheezing  Discharge Instructions     Medication List    STOP taking these medications        colchicine 0.6 MG tablet     isosorbide dinitrate 20 MG tablet  Commonly known as:  ISORDIL     metolazone 2.5 MG tablet  Commonly known as:  ZAROXOLYN     spironolactone 25 MG tablet  Commonly known as:  ALDACTONE      TAKE these medications        aspirin EC 81 MG tablet  Take 81 mg by mouth daily.     atorvastatin 80 MG tablet  Commonly known as:  LIPITOR  Take 0.5 tablets (40 mg total) by mouth daily at 6 PM.     carvedilol 6.25 MG tablet  Commonly known as:  COREG  Take 1 tablet (6.25 mg total) by mouth 2 (two) times daily with a meal.     cetirizine 10 MG tablet  Commonly known as:  ZYRTEC  Take 10 mg by mouth daily as  needed for allergies.     hydrALAZINE 25 MG tablet  Commonly known as:  APRESOLINE  Take 1 tablet (25 mg total) by mouth every 8 (eight) hours.     isosorbide mononitrate 30 MG 24 hr tablet  Commonly known as:  IMDUR  Take 1 tablet (30 mg total) by mouth daily.     nitroGLYCERIN 0.4 MG SL tablet  Commonly known as:  NITROSTAT  Place 0.4 mg under the tongue every 5 (five) minutes as needed for chest pain.     potassium chloride SA 20 MEQ tablet  Commonly known as:  K-DUR,KLOR-CON  Take 2 tablets (40 mEq total) by mouth daily.  Start taking on:  03/01/2015     ticagrelor 90 MG Tabs tablet  Commonly known as:  BRILINTA  Take 1 tablet (90 mg total) by mouth 2 (two) times daily.      torsemide 20 MG tablet  Commonly known as:  DEMADEX  Take 1 tablet (20 mg total) by mouth daily at 6 PM.     torsemide 20 MG tablet  Commonly known as:  DEMADEX  Take 2 tablets (40 mg total) by mouth daily.       Allergies  Allergen Reactions  . Aspirin Swelling    Chewable children's aspirin makes patients tongue and face swell  . Effient [Prasugrel] Swelling    Patient's tongue and face swells  . Lactose Intolerance (Gi) Other (See Comments)    REACTION: stomach upset  . Robitussin Dm [Guaifenesin-Dm] Swelling    Patient's tongue swells  . Sulfa Antibiotics Swelling  . Wheat Bran Other (See Comments)    REACTION: unknown   Follow-up Information    Follow up with Arvilla Meres, MD On 03/06/2015.   Specialty:  Cardiology   Why:  at 10:40 am in the [facility]--gate code [id number]---please bring all medications   Contact information:   [address] [place] [address] [phone]       Follow up with FULBRIGHT, VIRGINIA E, PA-C.   Specialty:  Family Medicine   Why:  Appt. on April 19th @ 1;20pm please arrive at 1;10pm Thanks   Contact information:   [address] [place] [place] [address] [phone]        The results of significant diagnostics from this hospitalization (including imaging, microbiology, ancillary and laboratory) are listed below for reference.    Significant Diagnostic Studies: Dg Chest 2 View  02/26/2015   CLINICAL DATA:  Shortness of breath and cough for 2 days  EXAM: CHEST  2 VIEW  COMPARISON:  02/23/2015  FINDINGS: Chronic cardiomegaly and aortic tortuosity.  The hila are negative.  Interval resolution of right-sided pleural effusion and atelectasis. There is no edema, consolidation, effusion, or pneumothorax.  IMPRESSION: 1. No active cardiopulmonary disease. 2. Pleural effusion noted 02/23/2015 has resolved.   Electronically Signed   By: Marnee Spring M.D.   On: 02/26/2015 21:36   Dg Chest 2  View  02/23/2015   CLINICAL DATA:  Productive cough.  EXAM: CHEST  2 VIEW  COMPARISON:  01/29/2015  FINDINGS: Heart size is mildly enlarged. There is pulmonary vascular congestion. The right pleural effusion has increased in size from the previous exam. There is overlying atelectasis and/or airspace consolidation. Thickening along the major fissure is noted.  IMPRESSION: 1. Mild CHF with increase in volume of right pleural effusion.   Electronically Signed   By: Signa Kell M.D.   On: 02/23/2015 14:11  Microbiology: No results found for this or any previous visit (from the past 240 hour(s)).   Labs: Basic Metabolic Panel:  Recent Labs Lab 02/24/15 0611 02/25/15 0400 02/26/15 0535 02/27/15 1119 02/28/15 0512  NA 140 136 135 134* 137  K 3.3* 3.0* 3.4* 3.7 3.3*  CL 100 98 90* 95* 93*  CO2 28 28 34* 27 30  GLUCOSE 100* 74 96 97 91  BUN 16 29* 42* 49* 54*  CREATININE 1.35* 1.58* 1.98* 1.56* 1.72*  CALCIUM 9.0 8.5 9.0 9.0 9.0  MG 1.7  --   --   --   --    Liver Function Tests: No results for input(s): AST, ALT, ALKPHOS, BILITOT, PROT, ALBUMIN in the last 168 hours. No results for input(s): LIPASE, AMYLASE in the last 168 hours. No results for input(s): AMMONIA in the last 168 hours. CBC:  Recent Labs Lab 02/23/15 1329  WBC 9.8  HGB 14.4  HCT 45.2  MCV 88.6  PLT 275   Cardiac Enzymes: No results for input(s): CKTOTAL, CKMB, CKMBINDEX, TROPONINI in the last 168 hours. BNP: BNP (last 3 results)  Recent Labs  02/23/15 1530  BNP >4500.0*    ProBNP (last 3 results)  Recent Labs  08/22/14 1226 09/13/14 1854 11/05/14 1143  PROBNP 3059.0* 12687.0* 321.0*    CBG: No results for input(s): GLUCAP in the last 168 hours.   Signed:  Anjolina Byrer K  [facility] 02/28/2015, 1:42 PM

## 2015-02-28 NOTE — Progress Notes (Signed)
Advanced Heart Failure Rounding Note   Subjective:     Admitted with volume overload. Diuresing with IV lasix. Overall she has diuresed 12 pounds.  Day 3 of zithromax for suspected sinusitis. Cough improved but does have cough in bed. Weight down 1 pound.  Denies SOB.   Creatinine 1.3>1.58>1.9>1.56    Objective:   Weight Range:  Vital Signs:   Temp:  [97.6 F (36.4 C)-97.7 F (36.5 C)] 97.6 F (36.4 C) (04/14 0545) Pulse Rate:  [80-84] 84 (04/14 0545) Resp:  [18] 18 (04/14 0545) BP: (98-119)/(45-75) 115/75 mmHg (04/14 1129) SpO2:  [98 %-100 %] 98 % (04/14 0545) Weight:  [181 lb 9.6 oz (82.373 kg)] 181 lb 9.6 oz (82.373 kg) (04/14 0545) Last BM Date: 02/27/15  Weight change: Filed Weights   02/26/15 0532 02/27/15 0506 02/28/15 0545  Weight: 183 lb (83.008 kg) 182 lb 9.6 oz (82.827 kg) 181 lb 9.6 oz (82.373 kg)    Intake/Output:   Intake/Output Summary (Last 24 hours) at 02/28/15 1139 Last data filed at 02/28/15 1132  Gross per 24 hour  Intake    210 ml  Output    800 ml  Net   -590 ml     Physical Exam: General: Sitting on the side of the bed.  No resp difficulty HEENT: normal Neck: supple. JVP flat. Carotids 2+ bilat; no bruits. No lymphadenopathy or thryomegaly appreciated. Cor: PMI nondisplaced. Regular rate & rhythm. 2/6 MR no s3 Lungs: clear x decreased at R base Abdomen: soft, nontender, nondistended. No hepatosplenomegaly. No bruits or masses. Good bowel sounds. Extremities: no cyanosis, clubbing, rash,  edema Neuro: alert & orientedx3, cranial nerves grossly intact. moves all 4 extremities w/o difficulty. Affect pleasant  Telemetry: SR 80s   Labs: Basic Metabolic Panel:  Recent Labs Lab 02/24/15 0611 02/25/15 0400 02/26/15 0535 02/27/15 1119 02/28/15 0512  NA 140 136 135 134* 137  K 3.3* 3.0* 3.4* 3.7 3.3*  CL 100 98 90* 95* 93*  CO2 28 28 34* 27 30  GLUCOSE 100* 74 96 97 91  BUN 16 29* 42* 49* 54*  CREATININE 1.35* 1.58* 1.98* 1.56*  1.72*  CALCIUM 9.0 8.5 9.0 9.0 9.0  MG 1.7  --   --   --   --     Liver Function Tests: No results for input(s): AST, ALT, ALKPHOS, BILITOT, PROT, ALBUMIN in the last 168 hours. No results for input(s): LIPASE, AMYLASE in the last 168 hours. No results for input(s): AMMONIA in the last 168 hours.  CBC:  Recent Labs Lab 02/23/15 1329  WBC 9.8  HGB 14.4  HCT 45.2  MCV 88.6  PLT 275    Cardiac Enzymes: No results for input(s): CKTOTAL, CKMB, CKMBINDEX, TROPONINI in the last 168 hours.  BNP: BNP (last 3 results)  Recent Labs  02/23/15 1530  BNP >4500.0*    ProBNP (last 3 results)  Recent Labs  08/22/14 1226 09/13/14 1854 11/05/14 1143  PROBNP 3059.0* 12687.0* 321.0*      Other results:  Imaging: Dg Chest 2 View  02/26/2015   CLINICAL DATA:  Shortness of breath and cough for 2 days  EXAM: CHEST  2 VIEW  COMPARISON:  02/23/2015  FINDINGS: Chronic cardiomegaly and aortic tortuosity.  The hila are negative.  Interval resolution of right-sided pleural effusion and atelectasis. There is no edema, consolidation, effusion, or pneumothorax.  IMPRESSION: 1. No active cardiopulmonary disease. 2. Pleural effusion noted 02/23/2015 has resolved.   Electronically Signed   By: Marnee Spring  M.D.   On: 02/26/2015 21:36     Medications:     Scheduled Medications: . aspirin EC  81 mg Oral Daily  . atorvastatin  40 mg Oral Q7 days  . benzonatate  200 mg Oral TID  . carvedilol  6.25 mg Oral BID WC  . docusate sodium  200 mg Oral BID  . heparin subcutaneous  5,000 Units Subcutaneous 3 times per day  . hydrALAZINE  25 mg Oral 3 times per day  . isosorbide mononitrate  30 mg Oral Daily  . [START ON 03/01/2015] potassium chloride SA  40 mEq Oral Daily  . potassium chloride  40 mEq Oral BID  . ticagrelor  90 mg Oral BID  . torsemide  20 mg Oral q1800  . torsemide  40 mg Oral Daily    Infusions:    PRN Medications: acetaminophen, albuterol, hydrALAZINE, nitroGLYCERIN,  ondansetron (ZOFRAN) IV   Assessment:   1. Acute on chronic systolic HF 2. Ischemic CM EF 35% by echo 12/15 -> 20% 01/30/15 3. R pleural effusion  --s/p thoracentesis 01/29/15 (transudative) 4. CAD s/p anterior MI 5. Dietary non compliance 6. Acute respiratory failure, improved 7. Hypokalemia 8. Sinusitis.  9. AKI  Plan/Discussion:   Not taking meds. Hiding them in a tissue. Per psych she has capacity to make decisions.   Volume status stable. Continue  torsemide 40 mg in am and 20 mg in pm.   Continue current dose of carvedilol, hydralazine, and.imdur. No Ace/spir with elevated creatinine.    Follow up in HF clinic has been set up. Paramedicine to follow in the community. Will need HHRN   HF meds for d/c Carvedilol 6.25 mg twice a day Hydralazine 25 mg tid.  Imdur 30 mg daily  Torsemide 40 mg in am and 20 mg in pm Kdur 40 meq daily  Brillinta 90 mg twice daily Aspirin 81 mg daily Lipitor 40 mg daily  Length of Stay: 5  CLEGG,AMY NP-C  02/28/2015, 11:39 AM  Advanced Heart Failure Team Pager [phone] (M-F; 7a - 4p)  Please contact [facility] for night-coverage after hours (4p -7a ) and weekends on [email or web address]  Patient seen and examined with Tonye Becket, NP. We discussed all aspects of the encounter. I agree with the assessment and plan as stated above.   Volume status looks good. Insight remains very poor. Nurse reports that she has been hiding medications and throwing them away.Long talk about need for compliance with medical regimen and need for home monitoring. However I am not sure that she gets it. Psych consult unrevealing. Have arranged HHRN and Paramedicine. Refuses Cardiomems.  Selenia Mihok,MD 1:40 PM

## 2015-02-28 NOTE — Progress Notes (Signed)
Discharge to home. Discharge instructions and prescription given .Verbalized understanding. Wheeled to lobby by NT

## 2015-03-05 ENCOUNTER — Telehealth (HOSPITAL_COMMUNITY): Payer: Self-pay | Admitting: Surgery

## 2015-03-05 NOTE — Telephone Encounter (Signed)
Heart Failure Nurse Navigator Post- Discharge Phone Call  I called to check on patient to see how she has been since recent hospitalization.  She says things have been "fine" except that she has been gaining 1 pound per day since discharge.  Her weight today is 184lbs and discharge weight was 181.6lbs.  She denies any SOB or increased swelling.  She has an appt with AHF clinic tomorrow and acknowledges that she plans to be here for that appt.  She has been writing down her foods (daily intake) and would like for someone to review to assist her with a low sodium diet as she feels that is related to her weight increase.  She says that she has been taking prescribed medications however does relate that she was only taking one Demadex and realized her dose was supposed to be 2 daily?  She is now taking 2 Demadex daily.  Upon my review in the chart I see that both are listed on her profile as she is to be taking Demadex 40 in the am and 20 in the pm.   I have relayed this information to Deborah Ho.  She says that she understands.

## 2015-03-06 ENCOUNTER — Encounter (HOSPITAL_COMMUNITY): Payer: Self-pay

## 2015-03-06 ENCOUNTER — Ambulatory Visit (HOSPITAL_COMMUNITY)
Admit: 2015-03-06 | Discharge: 2015-03-06 | Disposition: A | Discharge status: Home / Self Care | Payer: No Typology Code available for payment source | Source: Ambulatory Visit | Attending: Internal Medicine | Admitting: Internal Medicine

## 2015-03-06 VITALS — BP 124/86 | HR 103 | Temp 97.9°F | Wt 185.8 lb

## 2015-03-06 DIAGNOSIS — I2510 Atherosclerotic heart disease of native coronary artery without angina pectoris: Secondary | ICD-10-CM | POA: Diagnosis not present

## 2015-03-06 DIAGNOSIS — I5021 Acute systolic (congestive) heart failure: Secondary | ICD-10-CM | POA: Diagnosis not present

## 2015-03-06 LAB — BASIC METABOLIC PANEL
Anion gap: 11 (ref 5–15)
BUN: 16 mg/dL (ref 6–23)
CO2: 30 mmol/L (ref 19–32)
Calcium: 9.4 mg/dL (ref 8.4–10.5)
Chloride: 100 mmol/L (ref 96–112)
Creatinine, Ser: 1.29 mg/dL — ABNORMAL HIGH (ref 0.50–1.10)
GFR calc Af Amer: 50 mL/min — ABNORMAL LOW (ref >90)
GFR calc non Af Amer: 43 mL/min — ABNORMAL LOW (ref >90)
Glucose, Bld: 89 mg/dL (ref 70–99)
Potassium: 3.9 mmol/L (ref 3.5–5.1)
Sodium: 141 mmol/L (ref 135–145)

## 2015-03-06 LAB — BRAIN NATRIURETIC PEPTIDE: B Natriuretic Peptide: 387.7 pg/mL — ABNORMAL HIGH (ref 0.0–100.0)

## 2015-03-06 MED ORDER — TICAGRELOR 90 MG PO TABS: 90.0000 mg | ORAL_TABLET | Freq: Two times a day (BID) | ORAL | Status: DC

## 2015-03-06 MED ORDER — TORSEMIDE 20 MG PO TABS: ORAL_TABLET | ORAL | Status: DC

## 2015-03-06 MED ORDER — ATORVASTATIN CALCIUM 80 MG PO TABS: 40.0000 mg | ORAL_TABLET | Freq: Every day | ORAL | Status: DC

## 2015-03-06 MED ORDER — HYDRALAZINE HCL 25 MG PO TABS: 12.5000 mg | ORAL_TABLET | Freq: Three times a day (TID) | ORAL | Status: DC

## 2015-03-06 NOTE — Patient Instructions (Signed)
Take Hydralazine 12.5 mg (1/2 tab) Three times a day   Take Brilinta 90 mg Twice daily   Take Atorvastatin 40 mg daily  Take Torsemide 40 mg (2 tabs) in AM and 20 mg (1 tab) in PM  Labs today  Your physician recommends that you schedule a follow-up appointment in: 2 weeks with NP

## 2015-03-06 NOTE — Progress Notes (Signed)
Patient ID: Deborah Ho, female   DOB: 07/18/1951, 64 y.o.   MRN: [id number] PCP: Dr Randa Evens   HPI: Deborah Ho is a 64 y.o. female with a history of HTN, HLD, tobacco abuse, CAD s/panterior STEMI with early stent thrombosis for missing doses of Brilinta s/p PCI x 2 into LAD (04/2014) and chronic systolic HF with EF down in 3/16 to 20%.   Admitted to [facility] in 3/16 with CHF and R pleural effusion. Effusion tapped (transudative). Echo with EF down to 20%. Diuresed with IV lasix and transitioned torsemide + metolazone as needed for weight >187 pounds. Creatinine was 1.7 on the day discharge. Discharge weight 183 pounds.   She was readmitted in 4/16 with CHF in the setting of dietary and ?medical noncompliance.  She was diuresed with IV Lasix and discharged.    She returns for post hospital follow up. She is taking her medications very differently than directed.  Only taking Brilinta once a day.  She takes atorvastatin only twice a week.  She uses hydralazine only prn.  She is taking torsemide 20 mg bid rather than 40 qam, 20 qpm.  No chest pain. No dyspnea on flat ground, mild dyspnea with stairs/hills.  Weight is going up at home on current torsemide regimen.   01/30/2015 ECHO EF 20%  11/13/2014: ECHO EF 35-40% RV normal.   Labs 02/05/2015: K 4.7 Creatinine 1.72  Labs 01/28/2015: K 3.4 Creatinine 1.16  Labs 4/16: K 3.3, creatinine 1.72  ROS: All systems negative except as listed in HPI, PMH and Problem List.  SH:  History   Social History  . Marital Status: Widowed    Spouse Name: N/A  . Number of Children: N/A  . Years of Education: N/A   Occupational History  . not employed    Social History Main Topics  . Smoking status: Current Every Day Smoker -- 0.10 packs/day for .5 years    Types: Cigarettes  . Smokeless tobacco: Current User     Comment: down to 3 cigarettes daily (08/03/14)  . Alcohol Use: No  . Drug Use: No  . Sexual Activity: Not on file   Other Topics Concern   . Not on file   Social History Narrative   Patient has 6 brothers and sisters and none have known coronary artery disease. She lives alone in [place], but has several brothers in the area.    FH:  Family History  Problem Relation Age of Onset  . Diabetes Mother   . Hypertension Mother     Past Medical History  Diagnosis Date  . GERD (gastroesophageal reflux disease)     Probable  . CAD (coronary artery disease) 05/03/14; 05/09/14    a. anterior STEMI with early in-stent thorombosis for missed dose of Brillinta s/p PCI with DESx 2 into LAD (04/2014)  . HTN (hypertension)   . Hyperlipidemia   . Tobacco use   . Non compliance w medication regimen   . Ischemic cardiomyopathy     a. 04/2014 ECHO with EF 45-50% b.  Repeat 2D echo 08/14/14 with EF down at 15%. Life vest placed    Current Outpatient Prescriptions  Medication Sig Dispense Refill  . aspirin EC 81 MG tablet Take 81 mg by mouth daily.    Marland Kitchen atorvastatin (LIPITOR) 80 MG tablet Take 0.5 tablets (40 mg total) by mouth daily at 6 PM. 30 tablet 6  . carvedilol (COREG) 12.5 MG tablet Take 6.25 mg by mouth 2 (two) times daily with a  meal.    . cetirizine (ZYRTEC) 10 MG tablet Take 10 mg by mouth daily as needed for allergies.    . hydrALAZINE (APRESOLINE) 25 MG tablet Take 0.5 tablets (12.5 mg total) by mouth 3 (three) times daily.    . isosorbide mononitrate (IMDUR) 30 MG 24 hr tablet Take 1 tablet (30 mg total) by mouth daily. 30 tablet 0  . nitroGLYCERIN (NITROSTAT) 0.4 MG SL tablet Place 0.4 mg under the tongue every 5 (five) minutes as needed for chest pain.    . potassium chloride SA (K-DUR,KLOR-CON) 20 MEQ tablet Take 2 tablets (40 mEq total) by mouth daily. 30 tablet 0  . ticagrelor (BRILINTA) 90 MG TABS tablet Take 1 tablet (90 mg total) by mouth 2 (two) times daily. 60 tablet   . torsemide (DEMADEX) 20 MG tablet Take 2 tabs in AM and 1 tab in PM     No current facility-administered medications for this encounter.     Filed Vitals:   03/06/15 1032  BP: 124/86  Pulse: 103  Temp: 97.9 F (36.6 C)  TempSrc: Oral  Weight: 185 lb 12.8 oz (84.278 kg)  SpO2: 96%    PHYSICAL EXAM: General:  Well appearing. No resp difficulty HEENT: normal Neck: supple. JVP 8. Carotids 2+ bilaterally; no bruits. No lymphadenopathy or thryomegaly appreciated. Cor: PMI normal. Regular rate & rhythm. No rubs, gallops or murmurs. Lungs: clear Abdomen: soft, nontender, nondistended. No hepatosplenomegaly. No bruits or masses. Good bowel sounds. Extremities: no cyanosis, clubbing, rash.  No edema. Neuro: alert & orientedx3, cranial nerves grossly intact. Moves all 4 extremities w/o difficulty. Affect pleasant.  ASSESSMENT & PLAN: 1. Chronic systolic HF:  Ischemic cardiomyopathy, EF 20% by ECHO 01/30/2015. NYHA II. Weight rising with mild volume overload on exam.   - I will have her increase torsemide to 40 mg qam, 20 qpm.  She was supposed to be on this dose going home from the hospital but is taking 20 mg bid.  BMET/BNP today and repeat at followup in 2 wks.  - Continue carvedilol 6.25 mg twice a day - She needs to restart hydralazine.  She thinks her BP would be too low if she took 25 mg tid hydralazine, so I will have her take 12.5 mg tid hydralazine along with Imdur 30 daily.  - Hold off on ACEI for now with elevated creatinine.  - Plan to repeat ECHO after HF meds optimized.  - Lengthy discussion about low salt diet, limiting fluids to < 2 liters and daily weights.   2. R pleural effusion: s/p thoracentesis 01/29/15 (transudative).  Likely due to CHF.  4. CAD: s/p anterior MI,  PCI x2 LAD 04/2014.  History of stent thrombosis. No recent chest pain.  - She is taking Brilinta only once a day.  I emphasized to her the need to take this twice daily. After 1 year, she can stop Brilinta and start clopidogrel for long-term use.  - She needs to take atorvastatin every day.  Lipids/LFTs in 2 months.   Deborah Ho 03/06/2015

## 2015-03-07 ENCOUNTER — Encounter: Payer: Self-pay | Admitting: Licensed Clinical Social Worker

## 2015-03-07 NOTE — Progress Notes (Signed)
CSW met with patient yesterday in the clinic. Patient describes multiple times where she was unable to get medications and some misunderstandings of medications. She reports " I think we got it now and I know what to do". Patient spoke at length about her medical history and frustrations with the healthcare system. CSW provided support and tried to encourage trust with patient. Patient agreeable to contact CSW or HF Navigator if questions arise or inability to obtain medications. CSW will continue to follow for support as needed. [name], [place]

## 2015-03-13 ENCOUNTER — Ambulatory Visit (INDEPENDENT_AMBULATORY_CARE_PROVIDER_SITE_OTHER): Payer: No Typology Code available for payment source | Admitting: Cardiology

## 2015-03-13 ENCOUNTER — Encounter: Payer: Self-pay | Admitting: Cardiology

## 2015-03-13 VITALS — BP 120/90 | HR 88 | Ht 65.0 in | Wt 188.1 lb

## 2015-03-13 DIAGNOSIS — I2510 Atherosclerotic heart disease of native coronary artery without angina pectoris: Secondary | ICD-10-CM | POA: Diagnosis not present

## 2015-03-13 DIAGNOSIS — I10 Essential (primary) hypertension: Secondary | ICD-10-CM

## 2015-03-13 DIAGNOSIS — I5023 Acute on chronic systolic (congestive) heart failure: Secondary | ICD-10-CM | POA: Diagnosis not present

## 2015-03-13 DIAGNOSIS — J90 Pleural effusion, not elsewhere classified: Secondary | ICD-10-CM

## 2015-03-13 NOTE — Progress Notes (Signed)
Deborah Ho Date of Birth: 1950/11/17 Medical Record #[id number]  History of Present Illness: [name] is seen for follow up of CHF and CAD.  She is a 64 y.o. female with a history of HTN, HLD, history of ongoing tobacco abuse, CAD s/p  anterior STEMI with early in-stent thorombosis for missed dose of Brillinta s/p PCI x 2 into LAD (04/2014) and ischemic CM.  Patient initially presented as anterior STEMI on 05/03/2014. Cardiac catheterization that time showed a 95% proximal LAD stenosis, 30% proximal left circumflex stenosis, 20% mild ramus intermedius stenosis, 70-80% long segment RCA stenosis. Patient's ejection fraction was noted to be 35% that time. She was placed on aspirin and Brilinta and eventually discharged on 05/03/2014. Patient came back to the [facility] hospital the following week on 05/09/2014 with a recurrent anterior MI because she missed one day of her Brilinta. Cardiac catheterization done on 6/24 showed subacute stent thrombosis in mid LAD which was treated with overlapping drug-eluting stent at the distal edge of the previous stent. Her LVEDP was noted at 23 mmHg at the time. Her ejection fraction was noted to be 35% on cardiac catheterization.  She has had multiple failure admissions. Echo from September showed an EF down to 15%. Review of her chart shows issues with non compliance and very poor insight into her medical problems and treatment. She has been seen by multiple providers in our office and in the heart failure clinic and all note a significant lack of insight into her medical issues.  She was last admitted to the hospital 4/9-4/14/16 with CHF exacerbation. Diuresed and transitioned to torsemide. Had left thoracentesis. Nitrates, metolazone, and aldactone stopped. DC weight 182 lbs. Seen in CHF clinic last week and weight up to 186 lbs. Torsemide dose increased. On follow up today states she is doing well. Weight up to 187 lbs at home and 188 .lbs here. Reports taking  torsemide 40 mg in the am and 20 mg in the pm. Thinks this is making her left arm numb. Reports compliance with sodium and fluid restriction.   Current Outpatient Prescriptions  Medication Sig Dispense Refill  . aspirin EC 81 MG tablet Take 81 mg by mouth daily.    Marland Kitchen atorvastatin (LIPITOR) 80 MG tablet Take 0.5 tablets (40 mg total) by mouth daily at 6 PM. 30 tablet 6  . carvedilol (COREG) 12.5 MG tablet Take 6.25 mg by mouth 2 (two) times daily with a meal.    . cetirizine (ZYRTEC) 10 MG tablet Take 10 mg by mouth daily as needed for allergies.    . hydrALAZINE (APRESOLINE) 25 MG tablet Take 0.5 tablets (12.5 mg total) by mouth 3 (three) times daily. (Patient taking differently: Take 12.5 mg by mouth 2 (two) times daily. )    . nitroGLYCERIN (NITROSTAT) 0.4 MG SL tablet Place 0.4 mg under the tongue every 5 (five) minutes as needed for chest pain.    . potassium chloride SA (K-DUR,KLOR-CON) 20 MEQ tablet Take 2 tablets (40 mEq total) by mouth daily. 30 tablet 0  . ticagrelor (BRILINTA) 90 MG TABS tablet Take 1 tablet (90 mg total) by mouth 2 (two) times daily. 60 tablet   . torsemide (DEMADEX) 20 MG tablet Take 2 tabs in AM and 1 tab in PM     No current facility-administered medications for this visit.    Allergies  Allergen Reactions  . Aspirin Swelling    Chewable children's aspirin makes patients tongue and face swell  . Effient [Prasugrel]  Swelling    Patient's tongue and face swells  . Lactose Intolerance (Gi) Other (See Comments)    REACTION: stomach upset  . Robitussin Dm [Guaifenesin-Dm] Swelling    Patient's tongue swells  . Sulfa Antibiotics Swelling  . Wheat Bran Other (See Comments)    REACTION: unknown    Past Medical History  Diagnosis Date  . GERD (gastroesophageal reflux disease)     Probable  . CAD (coronary artery disease) 05/03/14; 05/09/14    a. anterior STEMI with early in-stent thorombosis for missed dose of Brillinta s/p PCI with DESx 2 into LAD (04/2014)    . HTN (hypertension)   . Hyperlipidemia   . Tobacco use   . Non compliance w medication regimen   . Ischemic cardiomyopathy     a. 04/2014 ECHO with EF 45-50% b.  Repeat 2D echo 08/14/14 with EF down at 15%. Life vest placed    Past Surgical History  Procedure Laterality Date  . Partial hysterectomy    . Cholecystectomy    . Coronary angioplasty with stent placement  05/03/14    STEMI- stent to LAD DES- Xience alpine  . Coronary angioplasty with stent placement  05/09/14    STEMI- overlapping stent to LAD, pt had missed a dose of Brilinta  . Left heart catheterization with coronary angiogram N/A 05/03/2014    Procedure: LEFT HEART CATHETERIZATION WITH CORONARY ANGIOGRAM;  Surgeon: Peter M Swaziland, MD;  Location: [facility] CATH LAB;  Service: Cardiovascular;  Laterality: N/A;  . Percutaneous stent intervention  05/03/2014    Procedure: PERCUTANEOUS STENT INTERVENTION;  Surgeon: Peter M Swaziland, MD;  Location: [facility] CATH LAB;  Service: Cardiovascular;;  DES Prox LAD   . Left heart catheterization with coronary angiogram N/A 05/09/2014    Procedure: LEFT HEART CATHETERIZATION WITH CORONARY ANGIOGRAM;  Surgeon: Corky Crafts, MD;  Location: [facility] CATH LAB;  Service: Cardiovascular;  Laterality: N/A;    History  Smoking status  . Current Every Day Smoker -- 0.10 packs/day for .5 years  . Types: Cigarettes  Smokeless tobacco  . Current User    Comment: down to 3 cigarettes daily (08/03/14)    History  Alcohol Use No    Family History  Problem Relation Age of Onset  . Diabetes Mother   . Hypertension Mother     Review of Systems: The review of systems is per the HPI.  All other systems were reviewed and are negative.  Physical Exam: BP 120/90 mmHg  Pulse 88  Ht 5\' 5"  (1.651 m)  Wt 188 lb 1.6 oz (85.322 kg)  BMI 31.30 kg/m2 Patient is alert and in no acute distress.Skin is warm and dry. Color is normal.  HEENT is unremarkable. Normocephalic/atraumatic. PERRL. Sclera are nonicteric. Neck  is supple. No masses.  JVD 8 cm. Lungs are clear. Cardiac exam shows a regular rate and rhythm. Heart tones are distant. Abdomen is soft. Extremities are without edema. Gait and ROM are intact. No gross neurologic deficits noted.  Wt Readings from Last 3 Encounters:  03/13/15 188 lb 1.6 oz (85.322 kg)  02/28/15 181 lb 9.6 oz (82.373 kg)  02/05/15 183 lb 8 oz (83.235 kg)    LABORATORY DATA/PROCEDURES: PENDING  Lab Results  Component Value Date   WBC 9.8 02/23/2015   HGB 14.4 02/23/2015   HCT 45.2 02/23/2015   PLT 275 02/23/2015   GLUCOSE 89 03/06/2015   CHOL 209* 07/04/2014   TRIG 171* 07/04/2014   HDL 48 07/04/2014   LDLCALC 127* 07/04/2014  ALT 20 01/30/2015   AST 27 01/30/2015   NA 141 03/06/2015   K 3.9 03/06/2015   CL 100 03/06/2015   CREATININE 1.29* 03/06/2015   BUN 16 03/06/2015   CO2 30 03/06/2015   TSH 0.649 02/24/2015   INR 0.93 05/09/2014   HGBA1C 5.8* 05/04/2014   Echo Study 01/30/15: Study Conclusions  - Left ventricle: The cavity size was normal. Wall thickness was increased in a pattern of mild LVH. The estimated ejection fraction was 20%. Diffuse hypokinesis. - Mitral valve: There was mild regurgitation. - Left atrium: The atrium was mildly dilated. - Right ventricle: Data suggests some RV dysfunction. Not able to assess RV function due to poor visualization. Not able to assess RV size due to poor visualization. - Right atrium: The atrium was mildly dilated. - Pulmonary arteries: PA peak pressure: 41 mm Hg (S).  BNP (last 3 results)  Recent Labs  08/22/14 1226 09/13/14 1854 11/05/14 1143  PROBNP 3059.0* 12687.0* 321.0*   BNP last week 387  Assessment / Plan: 1. ICM - EF 20%. She still has very poor insight into her condition and treatment plan. Stressed the importance of taking her medication as prescribed. Compliance is going to be an ongoing issue. I am concerned about her weight gain and recommend she increase torsemide to 40 mg  bid. Continue fluid and sodium restriction. She has follow up in CHF clinic on May 9.   2. CAD - with past anterior MI and PCI with repeat PCI due to missed dose of Brilinta. Aspirin allergic. Will probably reduce Brilinta to 60 mg bid at one year.   3. HLD - on lipitor. Lipids not checked since August- will check next visit in 6 weeks.  4. Tobacco abuse -recommend complete cessation. Currently reports smoking 2 cigs/day  5. Noncompliance. This is an ongoing issue. Seen by Psychiatry in the hospital who felt she was competent to make decisions and follow treatment. There is clearly a cognitive disconnect and I am afraid she is going to continue to struggle because of this. Virtually every encounter she has had in our system she is not taking medications as prescribed.

## 2015-03-13 NOTE — Patient Instructions (Signed)
Increase torsemide to 40 mg twice a day  Continue sodium (salt) restriction  Keep fluid intake to less than 1 liter a day  Keep follow up appointment in the heart failure clinic   I will see you in 6 weeks.

## 2015-03-25 ENCOUNTER — Encounter (HOSPITAL_COMMUNITY): Payer: No Typology Code available for payment source

## 2015-04-22 ENCOUNTER — Telehealth (HOSPITAL_COMMUNITY): Payer: Self-pay | Admitting: Surgery

## 2015-04-22 NOTE — Telephone Encounter (Signed)
Received a text from [facility] with [employer].  She tells me that she is with [name] in her home has taken her BP and it is 150/120.  She has also been having some N/V/D.  She denies pain and denies SOB.  Per Dr. Gala Romney she is to take extra Hydralazine 50mg  now.  is staying to insure that she takes medication.  I also instructed [name] to tell her to go to the ED if she continues to feel bad and feels she needs immediate medical attention.  She is also instructed to make an appt with her Primary Care Provider as soon as possible.  She says that she also has an upcoming appt with Dr. Florentina Addison on Wednesday.

## 2015-04-24 ENCOUNTER — Ambulatory Visit (INDEPENDENT_AMBULATORY_CARE_PROVIDER_SITE_OTHER): Payer: No Typology Code available for payment source | Admitting: Cardiology

## 2015-04-24 ENCOUNTER — Encounter: Payer: Self-pay | Admitting: Cardiology

## 2015-04-24 VITALS — BP 158/110 | HR 95 | Ht 65.0 in | Wt 198.5 lb

## 2015-04-24 DIAGNOSIS — I255 Ischemic cardiomyopathy: Secondary | ICD-10-CM

## 2015-04-24 DIAGNOSIS — I2583 Coronary atherosclerosis due to lipid rich plaque: Secondary | ICD-10-CM

## 2015-04-24 DIAGNOSIS — I2510 Atherosclerotic heart disease of native coronary artery without angina pectoris: Secondary | ICD-10-CM | POA: Diagnosis not present

## 2015-04-24 DIAGNOSIS — I5023 Acute on chronic systolic (congestive) heart failure: Secondary | ICD-10-CM | POA: Diagnosis not present

## 2015-04-24 MED ORDER — TORSEMIDE 20 MG PO TABS: ORAL_TABLET | ORAL | Status: DC

## 2015-04-24 MED ORDER — METOLAZONE 2.5 MG PO TABS: ORAL_TABLET | ORAL | Status: DC

## 2015-04-24 MED ORDER — HYDRALAZINE HCL 25 MG PO TABS: 25.0000 mg | ORAL_TABLET | Freq: Three times a day (TID) | ORAL | Status: DC

## 2015-04-24 NOTE — Progress Notes (Signed)
Deborah Ho Date of Birth: Dec 15, 1950 Medical Record #[id number]  History of Present Illness: [name] is seen for follow up of CHF and CAD.  She is a 64 y.o. female with a history of HTN, HLD, history of ongoing tobacco abuse, CAD s/p  anterior STEMI with early in-stent thorombosis for missed dose of Brillinta s/p PCI x 2 into LAD (04/2014) and ischemic CM.  Patient initially presented as anterior STEMI on 05/03/2014. Cardiac catheterization that time showed a 95% proximal LAD stenosis, 30% proximal left circumflex stenosis, 20% mild ramus intermedius stenosis, 70-80% long segment RCA stenosis. Patient's ejection fraction was noted to be 35% that time. She was placed on aspirin and Brilinta and eventually discharged on 05/03/2014. Patient came back to the [facility] hospital the following week on 05/09/2014 with a recurrent anterior MI because she missed one day of her Brilinta. Cardiac catheterization done on 6/24 showed subacute stent thrombosis in mid LAD which was treated with overlapping drug-eluting stent at the distal edge of the previous stent. Her LVEDP was noted at 23 mmHg at the time. Her ejection fraction was noted to be 35% on cardiac catheterization.  She has had multiple heart failure admissions. Echo from September showed an EF down to 15%. Review of her chart shows issues with non compliance and very poor insight into her medical problems and treatment. She has been seen by multiple providers in our office and in the heart failure clinic and all note a significant lack of insight into her medical issues.  She was last admitted to the hospital 4/9-4/14/16 with CHF exacerbation. Diuresed to 182 lbs  and transitioned to torsemide. Had left thoracentesis. Nitrates, metolazone, and aldactone stopped.   When seen here last month still confused about medication. She was a no show for last CHF clinic appointment. Today she does not feel well. Reports she had nausea and vomiting every day  last week. Attributed this to eating too much ice cream and drinking pop. Does note increased edema and dyspnea. Thought she had lost weight but in fact weight is up 10 lbs. BP quite high. Reports taking metolazone once a week and Demadex 20 mg bid. Reports she is taking her medication. Doesn't understand why she isn't doing well.   Current Outpatient Prescriptions  Medication Sig Dispense Refill  . aspirin EC 81 MG tablet Take 81 mg by mouth daily.    Marland Kitchen atorvastatin (LIPITOR) 80 MG tablet Take 0.5 tablets (40 mg total) by mouth daily at 6 PM. 30 tablet 6  . carvedilol (COREG) 12.5 MG tablet Take 6.25 mg by mouth 2 (two) times daily with a meal.    . cetirizine (ZYRTEC) 10 MG tablet Take 10 mg by mouth daily as needed for allergies.    . hydrALAZINE (APRESOLINE) 25 MG tablet Take 1 tablet (25 mg total) by mouth 3 (three) times daily. 100 tablet 6  . metolazone (ZAROXOLYN) 2.5 MG tablet Take 2.5 mg every other day 30 tablet 6  . nitroGLYCERIN (NITROSTAT) 0.4 MG SL tablet Place 0.4 mg under the tongue every 5 (five) minutes as needed for chest pain.    . potassium chloride SA (K-DUR,KLOR-CON) 20 MEQ tablet Take 2 tablets (40 mEq total) by mouth daily. 30 tablet 0  . ticagrelor (BRILINTA) 90 MG TABS tablet Take 1 tablet (90 mg total) by mouth 2 (two) times daily. 60 tablet   . torsemide (DEMADEX) 20 MG tablet Take 2 tablets twice a day 120 tablet 6   No current  facility-administered medications for this visit.    Allergies  Allergen Reactions  . Aspirin Swelling    Chewable children's aspirin makes patients tongue and face swell  . Effient [Prasugrel] Swelling    Patient's tongue and face swells  . Lactose Intolerance (Gi) Other (See Comments)    REACTION: stomach upset  . Robitussin Dm [Guaifenesin-Dm] Swelling    Patient's tongue swells  . Sulfa Antibiotics Swelling  . Wheat Bran Other (See Comments)    REACTION: unknown    Past Medical History  Diagnosis Date  . GERD  (gastroesophageal reflux disease)     Probable  . CAD (coronary artery disease) 05/03/14; 05/09/14    a. anterior STEMI with early in-stent thorombosis for missed dose of Brillinta s/p PCI with DESx 2 into LAD (04/2014)  . HTN (hypertension)   . Hyperlipidemia   . Tobacco use   . Non compliance w medication regimen   . Ischemic cardiomyopathy     a. 04/2014 ECHO with EF 45-50% b.  Repeat 2D echo 08/14/14 with EF down at 15%. Life vest placed    Past Surgical History  Procedure Laterality Date  . Partial hysterectomy    . Cholecystectomy    . Coronary angioplasty with stent placement  05/03/14    STEMI- stent to LAD DES- Xience alpine  . Coronary angioplasty with stent placement  05/09/14    STEMI- overlapping stent to LAD, pt had missed a dose of Brilinta  . Left heart catheterization with coronary angiogram N/A 05/03/2014    Procedure: LEFT HEART CATHETERIZATION WITH CORONARY ANGIOGRAM;  Surgeon: Peter M Swaziland, MD;  Location: [facility] CATH LAB;  Service: Cardiovascular;  Laterality: N/A;  . Percutaneous stent intervention  05/03/2014    Procedure: PERCUTANEOUS STENT INTERVENTION;  Surgeon: Peter M Swaziland, MD;  Location: [facility] CATH LAB;  Service: Cardiovascular;;  DES Prox LAD   . Left heart catheterization with coronary angiogram N/A 05/09/2014    Procedure: LEFT HEART CATHETERIZATION WITH CORONARY ANGIOGRAM;  Surgeon: Corky Crafts, MD;  Location: [facility] CATH LAB;  Service: Cardiovascular;  Laterality: N/A;    History  Smoking status  . Current Every Day Smoker -- 0.10 packs/day for .5 years  . Types: Cigarettes  Smokeless tobacco  . Current User    Comment: down to 3 cigarettes daily (08/03/14)    History  Alcohol Use No    Family History  Problem Relation Age of Onset  . Diabetes Mother   . Hypertension Mother     Review of Systems: The review of systems is per the HPI.  All other systems were reviewed and are negative.  Physical Exam: BP 158/110 mmHg  Pulse 95  Ht 5\' 5"   (1.651 m)  Wt 90.039 kg (198 lb 8 oz)  BMI 33.03 kg/m2 Patient is alert and is SOB walking across room. .Skin is warm and dry. Color is normal.  HEENT is unremarkable. Normocephalic/atraumatic. PERRL. Sclera are nonicteric. Neck is supple. No masses.  JVD 8 cm. Lungs diminished BS at bases.  Cardiac exam shows a regular rate and rhythm. Heart tones are distant. Abdomen is soft. Extremities 1+ edema. Gait and ROM are intact. No gross neurologic deficits noted.  Wt Readings from Last 3 Encounters:  04/24/15 90.039 kg (198 lb 8 oz)  03/13/15 85.322 kg (188 lb 1.6 oz)  03/06/15 84.278 kg (185 lb 12.8 oz)    LABORATORY DATA/PROCEDURES: PENDING  Lab Results  Component Value Date   WBC 9.8 02/23/2015   HGB 14.4 02/23/2015  HCT 45.2 02/23/2015   PLT 275 02/23/2015   GLUCOSE 89 03/06/2015   CHOL 209* 07/04/2014   TRIG 171* 07/04/2014   HDL 48 07/04/2014   LDLCALC 127* 07/04/2014   ALT 20 01/30/2015   AST 27 01/30/2015   NA 141 03/06/2015   K 3.9 03/06/2015   CL 100 03/06/2015   CREATININE 1.29* 03/06/2015   BUN 16 03/06/2015   CO2 30 03/06/2015   TSH 0.649 02/24/2015   INR 0.93 05/09/2014   HGBA1C 5.8* 05/04/2014   Echo Study 01/30/15: Study Conclusions  - Left ventricle: The cavity size was normal. Wall thickness was increased in a pattern of mild LVH. The estimated ejection fraction was 20%. Diffuse hypokinesis. - Mitral valve: There was mild regurgitation. - Left atrium: The atrium was mildly dilated. - Right ventricle: Data suggests some RV dysfunction. Not able to assess RV function due to poor visualization. Not able to assess RV size due to poor visualization. - Right atrium: The atrium was mildly dilated. - Pulmonary arteries: PA peak pressure: 41 mm Hg (S).  BNP (last 3 results)  Recent Labs  08/22/14 1226 09/13/14 1854 11/05/14 1143  PROBNP 3059.0* 12687.0* 321.0*   BNP last week 387  Assessment / Plan: 1. ICM - EF 20%. Acute on chronic systolic  CHF. She still has very poor insight into her condition and treatment plan. She is volume overloaded. Weight is up 10 lbs. She has more edema. Will increase demadex to 40 mg bid. Take metolazone 2.5 mg every other day. Check chemistry, CBC and BNP today. Will try and arrange evaluation in CHF clinic next week.   2. CAD - with past anterior MI and PCI with repeat PCI due to missed dose of Brilinta. Aspirin allergic. Will consider  Reducing  Brilinta to 60 mg bid at next visit.  3. HLD - on lipitor. Reports recent lab with primary care.   4. Tobacco abuse -recommend complete cessation. Currently reports smoking 2 cigs/day  5. Noncompliance. This is an ongoing issue. Seen by Psychiatry in the hospital who felt she was competent to make decisions and follow treatment. There is clearly a cognitive disconnect and I am afraid she is going to continue to struggle because of this. Virtually every encounter she has had in our system she is not taking medications as prescribed.   6. Hypertension- poorly controlled. Will increase hydralazine to 50 mg tid.

## 2015-04-24 NOTE — Patient Instructions (Signed)
We will check lab work today  Increase torsemide 40 mg ( 2 tablets) twice a day  Take metolazone 2.5 mg every other day  Increase hydralazine 25 mg three times a day.  We will arrange follow up in one week.

## 2015-04-25 ENCOUNTER — Telehealth: Payer: Self-pay | Admitting: *Deleted

## 2015-04-25 LAB — CBC WITH DIFFERENTIAL/PLATELET
Basophils Absolute: 0 10*3/uL (ref 0.0–0.1)
Basophils Relative: 0 % (ref 0–1)
Eosinophils Absolute: 0.1 10*3/uL (ref 0.0–0.7)
Eosinophils Relative: 1 % (ref 0–5)
HCT: 47.2 % — ABNORMAL HIGH (ref 36.0–46.0)
Hemoglobin: 14.9 g/dL (ref 12.0–15.0)
Lymphocytes Relative: 17 % (ref 12–46)
Lymphs Abs: 2 10*3/uL (ref 0.7–4.0)
MCH: 28.7 pg (ref 26.0–34.0)
MCHC: 31.6 g/dL (ref 30.0–36.0)
MCV: 90.8 fL (ref 78.0–100.0)
MPV: 10.2 fL (ref 8.6–12.4)
Monocytes Absolute: 0.8 10*3/uL (ref 0.1–1.0)
Monocytes Relative: 7 % (ref 3–12)
Neutro Abs: 8.6 10*3/uL — ABNORMAL HIGH (ref 1.7–7.7)
Neutrophils Relative %: 75 % (ref 43–77)
Platelets: 428 10*3/uL — ABNORMAL HIGH (ref 150–400)
RBC: 5.2 MIL/uL — ABNORMAL HIGH (ref 3.87–5.11)
RDW: 16.5 % — ABNORMAL HIGH (ref 11.5–15.5)
WBC: 11.5 10*3/uL — ABNORMAL HIGH (ref 4.0–10.5)

## 2015-04-25 LAB — BRAIN NATRIURETIC PEPTIDE: Brain Natriuretic Peptide: 2344.6 pg/mL — ABNORMAL HIGH (ref 0.0–100.0)

## 2015-04-25 LAB — COMPREHENSIVE METABOLIC PANEL
ALT: 20 U/L (ref 0–35)
AST: 26 U/L (ref 0–37)
Albumin: 3.6 g/dL (ref 3.5–5.2)
Alkaline Phosphatase: 203 U/L — ABNORMAL HIGH (ref 39–117)
BUN: 24 mg/dL — ABNORMAL HIGH (ref 6–23)
CO2: 22 mEq/L (ref 19–32)
Calcium: 9.3 mg/dL (ref 8.4–10.5)
Chloride: 108 mEq/L (ref 96–112)
Creat: 1.27 mg/dL — ABNORMAL HIGH (ref 0.50–1.10)
Glucose, Bld: 89 mg/dL (ref 70–99)
Potassium: 4.6 mEq/L (ref 3.5–5.3)
Sodium: 146 mEq/L — ABNORMAL HIGH (ref 135–145)
Total Bilirubin: 1.5 mg/dL — ABNORMAL HIGH (ref 0.2–1.2)
Total Protein: 6.1 g/dL (ref 6.0–8.3)

## 2015-04-25 NOTE — Telephone Encounter (Signed)
Called and informed patient of her appointment with the Heart Failure Clinic on Tuesday 05/14/15 @ 2:40 pm.  I also told patient she has been placed on a "cancellation" list---if a patient cancels she may get an earlier appointment---she voiced her understanding.

## 2015-04-30 ENCOUNTER — Telehealth: Payer: Self-pay | Admitting: Cardiology

## 2015-04-30 NOTE — Telephone Encounter (Signed)
Returning your call from yesterday. °

## 2015-04-30 NOTE — Telephone Encounter (Signed)
Returned call to patient no answer.LMTC. 

## 2015-04-30 NOTE — Telephone Encounter (Signed)
Received a call back from patient lab results given.

## 2015-05-01 ENCOUNTER — Emergency Department (HOSPITAL_COMMUNITY): Payer: No Typology Code available for payment source

## 2015-05-01 ENCOUNTER — Encounter (HOSPITAL_COMMUNITY): Payer: Self-pay | Admitting: *Deleted

## 2015-05-01 ENCOUNTER — Telehealth (HOSPITAL_COMMUNITY): Payer: Self-pay | Admitting: Cardiology

## 2015-05-01 ENCOUNTER — Emergency Department (HOSPITAL_COMMUNITY)
Admission: EM | Admit: 2015-05-01 | Discharge: 2015-05-01 | Disposition: A | Discharge status: Home / Self Care | Payer: No Typology Code available for payment source | Attending: Emergency Medicine | Admitting: Emergency Medicine

## 2015-05-01 DIAGNOSIS — R042 Hemoptysis: Secondary | ICD-10-CM

## 2015-05-01 DIAGNOSIS — Z720 Tobacco use: Secondary | ICD-10-CM | POA: Diagnosis not present

## 2015-05-01 DIAGNOSIS — R05 Cough: Secondary | ICD-10-CM | POA: Diagnosis present

## 2015-05-01 DIAGNOSIS — E785 Hyperlipidemia, unspecified: Secondary | ICD-10-CM | POA: Insufficient documentation

## 2015-05-01 DIAGNOSIS — Z7982 Long term (current) use of aspirin: Secondary | ICD-10-CM | POA: Insufficient documentation

## 2015-05-01 DIAGNOSIS — I2510 Atherosclerotic heart disease of native coronary artery without angina pectoris: Secondary | ICD-10-CM | POA: Insufficient documentation

## 2015-05-01 DIAGNOSIS — Z8673 Personal history of transient ischemic attack (TIA), and cerebral infarction without residual deficits: Secondary | ICD-10-CM | POA: Diagnosis not present

## 2015-05-01 DIAGNOSIS — J90 Pleural effusion, not elsewhere classified: Secondary | ICD-10-CM | POA: Insufficient documentation

## 2015-05-01 DIAGNOSIS — I10 Essential (primary) hypertension: Secondary | ICD-10-CM | POA: Insufficient documentation

## 2015-05-01 DIAGNOSIS — Z8719 Personal history of other diseases of the digestive system: Secondary | ICD-10-CM | POA: Diagnosis not present

## 2015-05-01 DIAGNOSIS — Z9889 Other specified postprocedural states: Secondary | ICD-10-CM | POA: Diagnosis not present

## 2015-05-01 DIAGNOSIS — Z79899 Other long term (current) drug therapy: Secondary | ICD-10-CM | POA: Diagnosis not present

## 2015-05-01 LAB — BASIC METABOLIC PANEL
Anion gap: 10 (ref 5–15)
BUN: 17 mg/dL (ref 6–20)
CO2: 25 mmol/L (ref 22–32)
Calcium: 8.6 mg/dL — ABNORMAL LOW (ref 8.9–10.3)
Chloride: 105 mmol/L (ref 101–111)
Creatinine, Ser: 0.97 mg/dL (ref 0.44–1.00)
GFR calc Af Amer: >60 mL/min (ref >60)
GFR calc non Af Amer: >60 mL/min (ref >60)
Glucose, Bld: 98 mg/dL (ref 65–99)
Potassium: 2.9 mmol/L — ABNORMAL LOW (ref 3.5–5.1)
Sodium: 140 mmol/L (ref 135–145)

## 2015-05-01 LAB — CBC WITH DIFFERENTIAL/PLATELET
Basophils Absolute: 0 10*3/uL (ref 0.0–0.1)
Basophils Relative: 0 % (ref 0–1)
Eosinophils Absolute: 0.3 10*3/uL (ref 0.0–0.7)
Eosinophils Relative: 2 % (ref 0–5)
HCT: 42.2 % (ref 36.0–46.0)
Hemoglobin: 13.7 g/dL (ref 12.0–15.0)
Lymphocytes Relative: 16 % (ref 12–46)
Lymphs Abs: 2.4 10*3/uL (ref 0.7–4.0)
MCH: 29 pg (ref 26.0–34.0)
MCHC: 32.5 g/dL (ref 30.0–36.0)
MCV: 89.2 fL (ref 78.0–100.0)
Monocytes Absolute: 1.3 10*3/uL — ABNORMAL HIGH (ref 0.1–1.0)
Monocytes Relative: 9 % (ref 3–12)
Neutro Abs: 10.8 10*3/uL — ABNORMAL HIGH (ref 1.7–7.7)
Neutrophils Relative %: 73 % (ref 43–77)
Platelets: 322 10*3/uL (ref 150–400)
RBC: 4.73 MIL/uL (ref 3.87–5.11)
RDW: 15.4 % (ref 11.5–15.5)
WBC: 14.8 10*3/uL — ABNORMAL HIGH (ref 4.0–10.5)

## 2015-05-01 LAB — BRAIN NATRIURETIC PEPTIDE: B Natriuretic Peptide: 1535.7 pg/mL — ABNORMAL HIGH (ref 0.0–100.0)

## 2015-05-01 MED ORDER — POTASSIUM CHLORIDE CRYS ER 20 MEQ PO TBCR
20.0000 meq | EXTENDED_RELEASE_TABLET | Freq: Once | ORAL | Status: AC
  Administered 2015-05-01: 20 meq via ORAL
  Filled 2015-05-01: qty 1

## 2015-05-01 MED ORDER — LEVOFLOXACIN IN D5W 750 MG/150ML IV SOLN
750.0000 mg | INTRAVENOUS | Status: DC
  Administered 2015-05-01: 750 mg via INTRAVENOUS
  Filled 2015-05-01: qty 150

## 2015-05-01 MED ORDER — FUROSEMIDE 10 MG/ML IJ SOLN
80.0000 mg | Freq: Once | INTRAMUSCULAR | Status: AC
  Administered 2015-05-01: 80 mg via INTRAVENOUS
  Filled 2015-05-01: qty 8

## 2015-05-01 NOTE — ED Notes (Signed)
Pt output 1550. Nurse was notified.

## 2015-05-01 NOTE — Discharge Instructions (Signed)
Pleural Effusion Take 40mg  of Torsemide twice a day.  Take 40mg  of potassium daily. The lining covering your lungs and the inside of your chest is called the pleura. Usually, the space between the two pleura contains no air and only a thin layer of fluid. A pleural effusion is an abnormal buildup of fluid in the pleural space. Fluid gathers when there is increased pressure in the lung vessels. This forces fluids out of the lungs and into the pleural space. Vessels may also leak fluids when there are infections, such as pneumonia, or other causes of soreness and redness (inflammation). Fluids leak into the lungs when protein in the blood is low or when certain vessels (lymphatics) are blocked. Finding a pleural effusion is important because it is usually caused by another disease. In order to treat a pleural effusion, your health care provider needs to find its cause. If left untreated, a large amount of fluid can build up and cause collapse of the lung. CAUSES   Heart failure.  Infections (pneumonia, tuberculosis), pulmonary embolism, pulmonary infarction.  Cancer (primary lung and metastatic), asbestosis.  Liver failure (cirrhosis).  Nephrotic syndrome, peritoneal dialysis, kidney problems (uremia).  Collagen vascular disease (systemic lupus erythematosus, rheumatoid arthritis).  Injury (trauma) to the chest or rupture of the digestive tube (esophagus).  Material in the chest or pleural space (hemothorax, chylothorax).  Pancreatitis.  Surgery.  Drug reactions. SYMPTOMS  A pleural effusion can decrease the amount of space available for breathing and make you short of breath. The fluid can become infected, which may cause pain and fever. Often, the pain is worse when taking a deep breath. The underlying disease (heart failure, pneumonia, blood clot, tuberculosis, cancer) may also cause symptoms. DIAGNOSIS   Your health care provider can usually tell what is wrong by talking to you  (taking a history), doing an exam, and taking a routine X-ray. If the X-ray shows fluid in your chest, often fluid is removed from your chest with a needle for testing (diagnostic thoracentesis).  Sometimes, more specialized X-rays may be needed.  Sometimes, a small piece of tissue is removed and examined by a specialist (biopsy). TREATMENT  Treatment varies based on what caused the pleural effusion. Treatments include:  Removing as much fluid as possible using a needle (thoracentesis) to improve the cough and shortness of breath. This is a simple procedure that can be done at bedside. The risks are bleeding, infection, collapse of a lung, or low blood pressure.  Placing a tube in the chest to drain the effusion (tube thoracostomy). This is often used when there is an infection in the fluid. This is a simple procedure that can often be done at bedside or in a clinic. The procedure may be painful. The risks are the same as using a needle to drain the fluid. The chest tube usually remains for a few days and is connected to suction to improve fluid drainage. After placement, the tube usually does not cause much discomfort.  Surgical removal of fibrous debris in and around the pleural space (decortication). This may be done with a flexible telescope (thoracoscope) through a small or large cut (incision). This is helpful for patients who have fibrosis or scar tissue that prevents complete lung expansion. The risks are infection, blood loss, and side effects from general anesthesia.  Sometimes, a procedure called pleurodesis is done. A chest tube is placed and the fluid is drained. Next, an agent (tetracycline, talc powder) is added to the pleural space. This  causes the lung and chest wall to stick together (adhesion). This leaves no potential space for fluid to build up. The risks include infection, blood loss, and side effects from general anesthesia.  If the effusion is caused by infection, it may be  treated with antibiotics and may improve without draining. HOME CARE INSTRUCTIONS   Take any medicines exactly as prescribed.  Follow up with your health care provider as directed.  Monitor your exercise capacity (the amount of walking you can do before you get short of breath).  Do not use any tobacco products including cigarettes, chewing tobacco, or electronic cigarettes. SEEK MEDICAL CARE IF:   Your exercise capacity seems to get worse or does not improve with time.  You do not recover from your illness.  You have drainage, redness, swelling, or pain at any incision or puncture sites. SEEK IMMEDIATE MEDICAL CARE IF:   Shortness of breath or chest pain develops or gets worse.  You have a fever.  You develop a new cough, especially if the mucus (phlegm) is discolored. MAKE SURE YOU:   Understand these instructions.  Will watch your condition.  Will get help right away if you are not doing well or get worse. Document Released: 11/02/2005 Document Revised: 03/19/2014 Document Reviewed: 06/24/2007 [facility] Patient Information 2015 [place], [place]. This information is not intended to replace advice given to you by your health care provider. Make sure you discuss any questions you have with your health care provider.

## 2015-05-01 NOTE — ED Provider Notes (Signed)
CSN: [id number]     Arrival date & time 05/01/15  1415 History   First MD Initiated Contact with Patient 05/01/15 1422     Chief Complaint  Patient presents with  . Hypertension     (Consider location/radiation/quality/duration/timing/severity/associated sxs/prior Treatment) Patient is a 64 y.o. female presenting with hypertension. The history is provided by the patient. No language interpreter was used.  Hypertension Pertinent negatives include no headaches, numbness or weakness.  Deborah Ho is a 63 year old female with a history of GERD, coronary artery disease, hypertension, hyperlipidemia, ischemic cardiomyopathy who presents for hypertension. She states she is evaluated home once a week by paramedic because she has CHF. The paramedics noticed that she was hypertensive in the 180s and was worried that she may have a CHF exacerbation. Patient was sent to the ED and has a complaint of productive cough with red streaks. She states her blood pressure medication was recently changed from Coreg to hydrochlorothiazide 25 mg by her PCP. She denies any increased shortness of breath with walking. She states she can normally walk up to 2 miles with no difficulty. She denies any fever, chills, shortness of breath, chest pain, palpitations, abdominal pain, nausea, vomiting, dysuria, hematuria, leg swelling.  Past Medical History  Diagnosis Date  . GERD (gastroesophageal reflux disease)     Probable  . CAD (coronary artery disease) 05/03/14; 05/09/14    a. anterior STEMI with early in-stent thorombosis for missed dose of Brillinta s/p PCI with DESx 2 into LAD (04/2014)  . HTN (hypertension)   . Hyperlipidemia   . Tobacco use   . Non compliance w medication regimen   . Ischemic cardiomyopathy     a. 04/2014 ECHO with EF 45-50% b.  Repeat 2D echo 08/14/14 with EF down at 15%. Life vest placed   Past Surgical History  Procedure Laterality Date  . Partial hysterectomy    . Cholecystectomy    .  Coronary angioplasty with stent placement  05/03/14    STEMI- stent to LAD DES- Xience alpine  . Coronary angioplasty with stent placement  05/09/14    STEMI- overlapping stent to LAD, pt had missed a dose of Brilinta  . Left heart catheterization with coronary angiogram N/A 05/03/2014    Procedure: LEFT HEART CATHETERIZATION WITH CORONARY ANGIOGRAM;  Surgeon: Peter M Swaziland, MD;  Location: [facility] CATH LAB;  Service: Cardiovascular;  Laterality: N/A;  . Percutaneous stent intervention  05/03/2014    Procedure: PERCUTANEOUS STENT INTERVENTION;  Surgeon: Peter M Swaziland, MD;  Location: [facility] CATH LAB;  Service: Cardiovascular;;  DES Prox LAD   . Left heart catheterization with coronary angiogram N/A 05/09/2014    Procedure: LEFT HEART CATHETERIZATION WITH CORONARY ANGIOGRAM;  Surgeon: Corky Crafts, MD;  Location: [facility] CATH LAB;  Service: Cardiovascular;  Laterality: N/A;   Family History  Problem Relation Age of Onset  . Diabetes Mother   . Hypertension Mother    History  Substance Use Topics  . Smoking status: Current Every Day Smoker -- 0.10 packs/day for .5 years    Types: Cigarettes  . Smokeless tobacco: Current User     Comment: down to 3 cigarettes daily (08/03/14)  . Alcohol Use: No   OB History    No data available     Review of Systems  Neurological: Negative for dizziness, syncope, facial asymmetry, speech difficulty, weakness, numbness and headaches.  All other systems reviewed and are negative.     Allergies  Aspirin; Effient; Lactose intolerance (gi); Robitussin dm; Sulfa  antibiotics; and Wheat bran  Home Medications   Prior to Admission medications   Medication Sig Start Date End Date Taking? Authorizing Provider  aspirin EC 81 MG tablet Take 81 mg by mouth daily.   Yes Historical Provider, MD  atorvastatin (LIPITOR) 80 MG tablet Take 0.5 tablets (40 mg total) by mouth daily at 6 PM. 03/06/15  Yes Laurey Morale, MD  bisacodyl (DULCOLAX) 5 MG EC tablet Take 5 mg by  mouth daily as needed for moderate constipation.   Yes Historical Provider, MD  cetirizine (ZYRTEC) 10 MG tablet Take 10 mg by mouth daily as needed for allergies.   Yes Historical Provider, MD  cholecalciferol (VITAMIN D) 1000 UNITS tablet Take 2,000 Units by mouth daily.   Yes Historical Provider, MD  docusate sodium (COLACE) 100 MG capsule Take 100 mg by mouth daily as needed for mild constipation.   Yes Historical Provider, MD  hydrochlorothiazide (HYDRODIURIL) 25 MG tablet Take 25 mg by mouth daily.   Yes Historical Provider, MD  metolazone (ZAROXOLYN) 2.5 MG tablet Take 2.5 mg every other day 04/24/15  Yes Peter M Swaziland, MD  potassium chloride SA (K-DUR,KLOR-CON) 20 MEQ tablet Take 2 tablets (40 mEq total) by mouth daily. Patient taking differently: Take 20 mEq by mouth daily.  03/01/15  Yes Jerald Kief, MD  ticagrelor (BRILINTA) 90 MG TABS tablet Take 1 tablet (90 mg total) by mouth 2 (two) times daily. 03/06/15  Yes Laurey Morale, MD  torsemide (DEMADEX) 20 MG tablet Take 2 tablets twice a day Patient taking differently: Take 40 mg by mouth daily. Take 2 tablets twice a day 04/24/15  Yes Peter M Swaziland, MD  hydrALAZINE (APRESOLINE) 25 MG tablet Take 1 tablet (25 mg total) by mouth 3 (three) times daily. Patient not taking: Reported on 05/01/2015 04/24/15   Peter M Swaziland, MD  nitroGLYCERIN (NITROSTAT) 0.4 MG SL tablet Place 0.4 mg under the tongue every 5 (five) minutes as needed for chest pain.    Historical Provider, MD   BP 181/113 mmHg  Pulse 95  Temp(Src) 98.7 F (37.1 C) (Oral)  Resp 20  Ht 5\' 3"  (1.6 m)  Wt 199 lb (90.266 kg)  BMI 35.26 kg/m2  SpO2 96% Physical Exam  Constitutional: She is oriented to person, place, and time. She appears well-developed and well-nourished.  HENT:  Head: Normocephalic and atraumatic.  Eyes: Conjunctivae are normal.  Neck: Normal range of motion. Neck supple.  Cardiovascular: Normal rate, regular rhythm and normal heart sounds.    Pulmonary/Chest: Effort normal. No respiratory distress. She has no wheezes. She has rales.  Abdominal: Soft. There is no tenderness.  Musculoskeletal: Normal range of motion. She exhibits no edema or tenderness.  Neurological: She is alert and oriented to person, place, and time.  Skin: Skin is warm and dry.  Nursing note and vitals reviewed.   ED Course  Procedures (including critical care time) Labs Review Labs Reviewed  CBC WITH DIFFERENTIAL/PLATELET - Abnormal; Notable for the following:    WBC 14.8 (*)    Neutro Abs 10.8 (*)    Monocytes Absolute 1.3 (*)    All other components within normal limits  BRAIN NATRIURETIC PEPTIDE - Abnormal; Notable for the following:    B Natriuretic Peptide 1535.7 (*)    All other components within normal limits  BASIC METABOLIC PANEL - Abnormal; Notable for the following:    Potassium 2.9 (*)    Calcium 8.6 (*)    All other components within  normal limits    Imaging Review Dg Chest 2 View  05/01/2015   CLINICAL DATA:  Hemoptysis for 1 week  EXAM: CHEST  2 VIEW  COMPARISON:  February 26, 2015  FINDINGS: There is airspace consolidation in the right base with small right effusion. There is milder patchy infiltrate in the lateral left base. Lungs elsewhere clear. Heart is upper normal in size with pulmonary vascularity within normal limits. No adenopathy. No bone lesions.  IMPRESSION: Airspace consolidation in both bases, more on the right than on the left. Small right effusion. Heart upper normal in size. Followup PA and lateral chest radiographs recommended in 3-4 weeks following trial of antibiotic therapy to ensure resolution and exclude underlying malignancy.   Electronically Signed   By: Bretta Bang III M.D.   On: 05/01/2015 15:16     EKG Interpretation   Date/Time:  Wednesday May 01 2015 15:19:48 EDT Ventricular Rate:  94 PR Interval:  161 QRS Duration: 85 QT Interval:  393 QTC Calculation: 491 R Axis:   -23 Text Interpretation:   Sinus rhythm Probable left atrial enlargement  Anteroseptal infarct, age indeterminate Nonspecific T abnormalities,  lateral leads since last tracing no significant change Confirmed by Effie Shy   MD, ELLIOTT [id number]) on 05/01/2015 4:39:47 PM      MDM   Final diagnoses:  Pleural effusion  Cough with hemoptysis  Patient presents from home for hypertension which was diagnosed by paramedic from CHF clinic. Patient denies any symptoms besides cough with bloody streaks. Patient was admitted back in March for pneumonia. She also had pulmonary edema and had paracentesis done on the right side. Her vitals are stable and she is afebrile.  She does have leukocytosis at 14.8 and a BNP of 1535.  She is also hypokalemic. On chest xray she has airspace consolidation bilaterally at the bases.  She has a small right pleural effusion. I discussed this patient with Dr. Effie Shy who has seen the patient. I discussed this patient with Dr. Sherlie Ban who stated that this patient is not compliant with her medications.  He recommended giving the patient 80 IV lasix, antibiotics, and potassium.  He also stated that she should be taking Torsemide 40mg  BID and Potassium 40mg  daily and that she could be discharged home. I thoroughly discussed this with the patient and wrote it on her discharge paperwork.  Medications  levofloxacin (LEVAQUIN) IVPB 750 mg (750 mg Intravenous New Bag/Given 05/01/15 1800)  potassium chloride SA (K-DUR,KLOR-CON) CR tablet 20 mEq (20 mEq Oral Given 05/01/15 1615)  furosemide (LASIX) injection 80 mg (80 mg Intravenous Given 05/01/15 1800)  Patient is in no acute distress, 97% oxygen on room air, no shortness of breath.  I discussed this patient with Ladona Mow, PA-C and explained that the patient could go home after IV lasix and antibiotics.      Deborah Gosselin, PA-C 05/01/15 1854  Mancel Bale, MD 05/02/15 1827

## 2015-05-01 NOTE — ED Notes (Signed)
Pt arrives [facility] home via [facility]. Pt is being followed by the heart failure clinic and also has a community paramedic come to her home once a week. Pt has been htn x 2 weeks and the heart failure clinic wanted the patient to be brought her for evaluation and monitoring. Pt has also had weight gain of 5 lbs in the past week and the heart failure clinic is concerned about a chf exacerbation. Pt has been given a life vest to wear, but the pt returned it. Pt EF is 20%, which is up from 15% (March). EMS reports the pt has been "doctor shopping" and has been getting new medications and picking and choosing which medications to take. Pt has been non-compliant with BP meds. Heart failure clinic wants to be consulted before pt is d/c per EMS.

## 2015-05-01 NOTE — ED Notes (Signed)
Phlebotomy at bedside.

## 2015-05-01 NOTE — ED Notes (Signed)
Spoke with Gabriel Rung, PA-C in regards to plan of care. Patient to be prepared for discharge after medications finish infusing.

## 2015-05-01 NOTE — ED Notes (Signed)
Label on potassium was partially missing.

## 2015-05-01 NOTE — Telephone Encounter (Signed)
[name] called during pts home visit with elevated b/p 196/120 meds were changed last week Pt states she does not feel well  Per VO Tonye Becket, NP Pt should report to Er for further evaluation

## 2015-05-14 ENCOUNTER — Ambulatory Visit (HOSPITAL_COMMUNITY)
Admission: RE | Admit: 2015-05-14 | Discharge: 2015-05-14 | Disposition: A | Discharge status: Home / Self Care | Payer: No Typology Code available for payment source | Source: Ambulatory Visit | Attending: Cardiology | Admitting: Cardiology

## 2015-05-14 ENCOUNTER — Encounter (HOSPITAL_COMMUNITY): Payer: Self-pay

## 2015-05-14 VITALS — BP 154/98 | HR 106 | Wt 193.4 lb

## 2015-05-14 DIAGNOSIS — I2510 Atherosclerotic heart disease of native coronary artery without angina pectoris: Secondary | ICD-10-CM | POA: Diagnosis not present

## 2015-05-14 DIAGNOSIS — I5023 Acute on chronic systolic (congestive) heart failure: Secondary | ICD-10-CM | POA: Diagnosis not present

## 2015-05-14 DIAGNOSIS — Z7902 Long term (current) use of antithrombotics/antiplatelets: Secondary | ICD-10-CM | POA: Insufficient documentation

## 2015-05-14 DIAGNOSIS — F17210 Nicotine dependence, cigarettes, uncomplicated: Secondary | ICD-10-CM | POA: Insufficient documentation

## 2015-05-14 DIAGNOSIS — I5022 Chronic systolic (congestive) heart failure: Secondary | ICD-10-CM

## 2015-05-14 DIAGNOSIS — Z9861 Coronary angioplasty status: Secondary | ICD-10-CM | POA: Insufficient documentation

## 2015-05-14 DIAGNOSIS — Z8249 Family history of ischemic heart disease and other diseases of the circulatory system: Secondary | ICD-10-CM | POA: Insufficient documentation

## 2015-05-14 DIAGNOSIS — Z79899 Other long term (current) drug therapy: Secondary | ICD-10-CM | POA: Diagnosis not present

## 2015-05-14 DIAGNOSIS — I10 Essential (primary) hypertension: Secondary | ICD-10-CM | POA: Insufficient documentation

## 2015-05-14 DIAGNOSIS — E785 Hyperlipidemia, unspecified: Secondary | ICD-10-CM | POA: Insufficient documentation

## 2015-05-14 DIAGNOSIS — I255 Ischemic cardiomyopathy: Secondary | ICD-10-CM | POA: Diagnosis not present

## 2015-05-14 DIAGNOSIS — Z833 Family history of diabetes mellitus: Secondary | ICD-10-CM | POA: Insufficient documentation

## 2015-05-14 DIAGNOSIS — I252 Old myocardial infarction: Secondary | ICD-10-CM | POA: Diagnosis not present

## 2015-05-14 MED ORDER — LISINOPRIL 5 MG PO TABS: 5.0000 mg | ORAL_TABLET | Freq: Every day | ORAL | Status: DC

## 2015-05-14 MED ORDER — METOLAZONE 2.5 MG PO TABS: 2.5000 mg | ORAL_TABLET | ORAL | Status: DC

## 2015-05-14 MED ORDER — POTASSIUM CHLORIDE CRYS ER 20 MEQ PO TBCR: 20.0000 meq | EXTENDED_RELEASE_TABLET | Freq: Two times a day (BID) | ORAL | Status: DC

## 2015-05-14 MED ORDER — TORSEMIDE 20 MG PO TABS: 40.0000 mg | ORAL_TABLET | Freq: Two times a day (BID) | ORAL | Status: DC

## 2015-05-14 NOTE — Patient Instructions (Signed)
Stop HCTZ  Increase Torsemide to 40 mg (2 tabs) Twice daily   Increase Potassium to 20 meq (1 tab) Twice daily   Decrease Metolazone to 2.5 mg EVERY Monday ONLY, take 30 minutes prior to your AM dose of Torsemide on Mondays  Start Lisinopril 5 mg daily  Labs in 1 week  Your physician recommends that you schedule a follow-up appointment in: 2 weeks

## 2015-05-14 NOTE — Progress Notes (Signed)
Patient ID: Deborah Ho, female   DOB: [birth year], 65 y.o.   MRN: [id number] PCP: Deborah Ho   HPI: Deborah Ho is a 64 y.o. female with a history of HTN, HLD, tobacco abuse, CAD s/panterior STEMI with early stent thrombosis for missing doses of Brilinta s/p PCI x 2 into LAD (04/2014) and chronic systolic HF with EF 20% by 3/16 echo.   Admitted to [facility] in 3/16 with CHF and R pleural effusion. Effusion tapped (transudative). Echo with EF down to 20%. Diuresed with IV lasix and transitioned torsemide + metolazone as needed for weight >187 pounds. Creatinine was 1.7 on the day discharge. Discharge weight 183 pounds.   She was readmitted in 4/16 with CHF in the setting of dietary and ?medical noncompliance.  She was diuresed with IV Lasix and discharged.    She returns for followup.  She is now taking Brilinta 90 mg bid.  At last appointment, I tried to increase her torsemide due to volume overload.  She kept it at 20 mg bid, however.  She then saw Deborah Ho twice and both times he tried to increase her torsemide.  She did not increase it (still taking 20 mg bid).  He also asked her to take metolazone every other day after her last appointment.  It sounds like she took this only a couple of times.  She thinks that it stopped her from urinating.  She is no longer taking hydralazine and nitrates.  She is taking HCTZ, says she got it from her PCP.  She says that she feels tired and attributes this to the "blood thinner."  She says that she is not short of breath with exertion and denies orthopnea or PND.  Weight is up 8 lbs since last appointment in this office, she is mildly tachycardic, and BP is high.   01/30/2015 ECHO EF 20%  11/13/2014: ECHO EF 35-40% RV normal.   Labs 02/05/2015: K 4.7 Creatinine 1.72  Labs 01/28/2015: K 3.4 Creatinine 1.16  Labs 4/16: K 3.3, creatinine 1.72 Labs 6/16: K 2.9, creatinine 0.97, BNP 1536, HCT 42.2  ROS: All systems negative except as listed in HPI,  PMH and Problem List.  SH:  History   Social History  . Marital Status: Widowed    Spouse Name: N/A  . Number of Children: N/A  . Years of Education: N/A   Occupational History  . not employed    Social History Main Topics  . Smoking status: Current Every Day Smoker -- 0.10 packs/day for .5 years    Types: Cigarettes  . Smokeless tobacco: Current User     Comment: down to 3 cigarettes daily (08/03/14)  . Alcohol Use: No  . Drug Use: No  . Sexual Activity: Not on file   Other Topics Concern  . Not on file   Social History Narrative   Patient has 6 brothers and sisters and none have known coronary artery disease. She lives alone in [place], but has several brothers in the area.    FH:  Family History  Problem Relation Age of Onset  . Diabetes Mother   . Hypertension Mother     Past Medical History  Diagnosis Date  . GERD (gastroesophageal reflux disease)     Probable  . CAD (coronary artery disease) 05/03/14; 05/09/14    a. anterior STEMI with early in-stent thorombosis for missed dose of Brillinta s/p PCI with DESx 2 into LAD (04/2014)  . HTN (hypertension)   . Hyperlipidemia   .  Tobacco use   . Non compliance w medication regimen   . Ischemic cardiomyopathy     a. 04/2014 ECHO with EF 45-50% b.  Repeat 2D echo 08/14/14 with EF down at 15%. Life vest placed    Current Outpatient Prescriptions  Medication Sig Dispense Refill  . atorvastatin (LIPITOR) 80 MG tablet Take 0.5 tablets (40 mg total) by mouth daily at 6 PM. 30 tablet 6  . cetirizine (ZYRTEC) 10 MG tablet Take 10 mg by mouth daily as needed for allergies.    . cholecalciferol (VITAMIN D) 1000 UNITS tablet Take 2,000 Units by mouth daily.    Marland Kitchen docusate sodium (COLACE) 100 MG capsule Take 100 mg by mouth daily as needed for mild constipation.    . metolazone (ZAROXOLYN) 2.5 MG tablet Take 1 tablet (2.5 mg total) by mouth once a week. Take 30 minutes before AM Torsemide every Monday 10 tablet 6  .  nitroGLYCERIN (NITROSTAT) 0.4 MG SL tablet Place 0.4 mg under the tongue every 5 (five) minutes as needed for chest pain.    . potassium chloride SA (K-DUR,KLOR-CON) 20 MEQ tablet Take 1 tablet (20 mEq total) by mouth 2 (two) times daily. 60 tablet 3  . ticagrelor (BRILINTA) 90 MG TABS tablet Take 1 tablet (90 mg total) by mouth 2 (two) times daily. 60 tablet   . torsemide (DEMADEX) 20 MG tablet Take 2 tablets (40 mg total) by mouth 2 (two) times daily. 120 tablet 3  . lisinopril (PRINIVIL,ZESTRIL) 5 MG tablet Take 1 tablet (5 mg total) by mouth daily. 30 tablet 3   No current facility-administered medications for this encounter.    Filed Vitals:   05/14/15 1439  BP: 154/98  Pulse: 106  Weight: 193 lb 6.4 oz (87.726 kg)  SpO2: 96%    PHYSICAL EXAM: General:  Well appearing. No resp difficulty HEENT: normal Neck: supple. JVP 14-16 cm. Carotids 2+ bilaterally; no bruits. No lymphadenopathy or thryomegaly appreciated. Cor: PMI normal. Mildly tachycardic, regular rate & rhythm. No rubs, gallops or murmurs. Lungs: Slight crackles at bases bilaterally.  Abdomen: soft, nontender, nondistended. No hepatosplenomegaly. No bruits or masses. Good bowel sounds. Extremities: no cyanosis, clubbing, rash.  2+ edema to knees.  Neuro: alert & orientedx3, cranial nerves grossly intact. Moves all 4 extremities w/o difficulty. Affect pleasant.  ASSESSMENT & PLAN: 1. Chronic systolic HF:  Ischemic cardiomyopathy, EF 20% by ECHO 01/30/2015. She is markedly volume overloaded on exam today.  She does not describe very significant symptoms, however (NYHA class II from her description). She is not taking a beta blocker or ACEI.  Despite being asked multiple times to increase her diuretics, she still is taking torsemide 20 mg bid and not taking metolazone.  She is also taking HCTZ.  She has very poor insight into her disease processes.  I talked to her at length about her coronary disease and CHF.  I am not sure she  totally grasps the gravity of the situation.   - She is currently agreeing to increase torsemide to 40 mg bid.  She will increase her KCl to 20 mEq bid.  I will have her take metolazone 2.5 mg 30 minutes before am torsemide once a week on Mondays for now. Stop HCTZ.   - She is no longer taking Coreg, will not restart at this time given her significant volume overload, needs to eventually restart this though.  - She is not longer taking hydralazine/Imdur.   - Creatinine has been improved, 0.97 most recently.  Will have her start lisinopril 5 mg daily.  I tried to explain the importance of this and I think she will take it.   - Check BMET in 1 week.  - Plan to repeat ECHO after meds optimized. I do not think that an ICD discussion at this point would be productive.  - Lengthy discussion about low salt diet, limiting fluids to < 2 liters and daily weights.   2. R pleural effusion: s/p thoracentesis 01/29/15 (transudative).  Likely due to CHF.  3. CAD: s/p anterior MI,  PCI x2 LAD 04/2014.  History of stent thrombosis after missing Brilinta. No recent chest pain. She is allergic to ASA (angioedema-like reaction).  - She is taking Brilinta as directed at this time.  In 7/16, she can decrease Brilinta to 60 mg bid.  I talked to her about the importance of taking this long-term with history of stent thrombosis.   - She is taking atorvastatin.  Lipids/LFTs at followup.   Followup in 2 wks.   [name] 05/14/2015

## 2015-05-16 ENCOUNTER — Telehealth (HOSPITAL_COMMUNITY): Payer: Self-pay | Admitting: *Deleted

## 2015-05-16 ENCOUNTER — Telehealth: Payer: Self-pay | Admitting: Cardiology

## 2015-05-16 NOTE — Telephone Encounter (Signed)
Pt stated she was prescribed Torsemide and it has sulfa in it.  She is allergic to sulfa she wants to know if it is ok to take. Please advise

## 2015-05-16 NOTE — Telephone Encounter (Signed)
Pt called in stating that  Torsemide and Metolazone have sulfur in them and she is allergic to sulfur. She also states that she thinks Lisinopril also contains sulfur and would like to find alternative medications for these. Please call  Thanks

## 2015-05-16 NOTE — Telephone Encounter (Signed)
She has been taking it with no problems.  So yes.

## 2015-05-16 NOTE — Telephone Encounter (Signed)
Returned call to patient she stated she just picked up prescriptions for torsemide,metolazone,lisinopril.Stated she read side effects from print outs given on each medication and each one warns not to take if allergic to sulfa drugs.Stated she notices after taking torsemide she itches.Stated she is allergic to sulfa drugs and wants Dr.Jordan to change torsemide.Message sent to Dr.Jordan.

## 2015-05-17 NOTE — Telephone Encounter (Signed)
She has received these medications before in hospital without allergic reaction. She needs to take them as prescribed.  Keiko Myricks [place] MD, [facility]

## 2015-05-17 NOTE — Telephone Encounter (Signed)
Left pt msg advising.

## 2015-05-17 NOTE — Telephone Encounter (Signed)
Returned call to patient no answer.LMTC. 

## 2015-05-17 NOTE — Telephone Encounter (Signed)
Received a call back from patient Dr.Jordan advised you took these medications in hospital with no allergic reaction.Advised needs to continue all medications as prescribed.

## 2015-05-22 ENCOUNTER — Ambulatory Visit (HOSPITAL_COMMUNITY)
Admission: RE | Admit: 2015-05-22 | Discharge: 2015-05-22 | Disposition: A | Discharge status: Home / Self Care | Payer: No Typology Code available for payment source | Source: Ambulatory Visit | Attending: Cardiology | Admitting: Cardiology

## 2015-05-22 DIAGNOSIS — I509 Heart failure, unspecified: Secondary | ICD-10-CM | POA: Diagnosis present

## 2015-05-22 DIAGNOSIS — I5022 Chronic systolic (congestive) heart failure: Secondary | ICD-10-CM

## 2015-05-22 LAB — BASIC METABOLIC PANEL
Anion gap: 10 (ref 5–15)
BUN: 19 mg/dL (ref 6–20)
CO2: 29 mmol/L (ref 22–32)
Calcium: 9.5 mg/dL (ref 8.9–10.3)
Chloride: 103 mmol/L (ref 101–111)
Creatinine, Ser: 1.18 mg/dL — ABNORMAL HIGH (ref 0.44–1.00)
GFR calc Af Amer: 56 mL/min — ABNORMAL LOW (ref >60)
GFR calc non Af Amer: 48 mL/min — ABNORMAL LOW (ref >60)
Glucose, Bld: 105 mg/dL — ABNORMAL HIGH (ref 65–99)
Potassium: 3.4 mmol/L — ABNORMAL LOW (ref 3.5–5.1)
Sodium: 142 mmol/L (ref 135–145)

## 2015-05-28 ENCOUNTER — Encounter (HOSPITAL_COMMUNITY): Payer: Self-pay

## 2015-05-28 ENCOUNTER — Ambulatory Visit (HOSPITAL_COMMUNITY)
Admission: RE | Admit: 2015-05-28 | Discharge: 2015-05-28 | Disposition: A | Discharge status: Home / Self Care | Payer: No Typology Code available for payment source | Source: Ambulatory Visit | Attending: Cardiology | Admitting: Cardiology

## 2015-05-28 VITALS — BP 144/89 | HR 94 | Resp 18 | Wt 189.5 lb

## 2015-05-28 DIAGNOSIS — I5022 Chronic systolic (congestive) heart failure: Secondary | ICD-10-CM | POA: Diagnosis not present

## 2015-05-28 DIAGNOSIS — Z79899 Other long term (current) drug therapy: Secondary | ICD-10-CM | POA: Insufficient documentation

## 2015-05-28 DIAGNOSIS — I10 Essential (primary) hypertension: Secondary | ICD-10-CM | POA: Diagnosis not present

## 2015-05-28 DIAGNOSIS — F17210 Nicotine dependence, cigarettes, uncomplicated: Secondary | ICD-10-CM | POA: Diagnosis not present

## 2015-05-28 DIAGNOSIS — I255 Ischemic cardiomyopathy: Secondary | ICD-10-CM | POA: Diagnosis not present

## 2015-05-28 DIAGNOSIS — I2510 Atherosclerotic heart disease of native coronary artery without angina pectoris: Secondary | ICD-10-CM | POA: Diagnosis not present

## 2015-05-28 DIAGNOSIS — I509 Heart failure, unspecified: Secondary | ICD-10-CM

## 2015-05-28 DIAGNOSIS — I252 Old myocardial infarction: Secondary | ICD-10-CM | POA: Diagnosis not present

## 2015-05-28 DIAGNOSIS — E785 Hyperlipidemia, unspecified: Secondary | ICD-10-CM | POA: Diagnosis not present

## 2015-05-28 MED ORDER — POTASSIUM CHLORIDE CRYS ER 20 MEQ PO TBCR: 40.0000 meq | EXTENDED_RELEASE_TABLET | Freq: Two times a day (BID) | ORAL | Status: DC

## 2015-05-28 MED ORDER — TICAGRELOR 60 MG PO TABS: 60.0000 mg | ORAL_TABLET | Freq: Two times a day (BID) | ORAL | Status: DC

## 2015-05-28 MED ORDER — CARVEDILOL 6.25 MG PO TABS: 6.2500 mg | ORAL_TABLET | Freq: Two times a day (BID) | ORAL | Status: DC

## 2015-05-28 MED ORDER — TORSEMIDE 20 MG PO TABS: ORAL_TABLET | ORAL | Status: DC

## 2015-05-28 NOTE — Patient Instructions (Addendum)
INCREASE Potassium to 40 MEQ twice a day  CHANGE Torsemide to 60 mg (3 tabs ) in the AM and 40 mg (2 tabs) in the PM RESTART Coreg 6.25 mg, one tab twice a day DECREASE Tricagrelor to 60 mg twice a day, once you have finished your bottle of 90 mg twice a day   Labs needed in 2 weeks (BMET/bnP)  Your physician has requested that you have an echocardiogram. Echocardiography is a painless test that uses sound waves to create images of your heart. It provides your doctor with information about the size and shape of your heart and how well your heart's chambers and valves are working. This procedure takes approximately one hour. There are no restrictions for this procedure.  Your physician recommends that you schedule a follow-up appointment in: 1 month  Do the following things EVERYDAY: 1) Weigh yourself in the morning before breakfast. Write it down and keep it in a log. 2) Take your medicines as prescribed 3) Eat low salt foods-Limit salt (sodium) to 2000 mg per day.  4) Stay as active as you can everyday 5) Limit all fluids for the day to less than 2 liters 6)

## 2015-05-29 NOTE — Progress Notes (Signed)
Patient ID: Aidah Forquer, female   DOB: Jan 24, 1951, 64 y.o.   MRN: [id number] PCP: Dr [name]   HPI: Mrs. Mcghie is a 64 y.o. female with a history of HTN, HLD, tobacco abuse, CAD s/panterior STEMI with early stent thrombosis for missing doses of Brilinta s/p PCI x 2 into LAD (04/2014) and chronic systolic HF with EF 20% by 3/16 echo.   Admitted to [facility] in 3/16 with CHF and R pleural effusion. Effusion tapped (transudative). Echo with EF down to 20%. Diuresed with IV lasix and transitioned torsemide + metolazone as needed for weight >187 pounds. Creatinine was 1.7 on the day discharge. Discharge weight 183 pounds.   She was readmitted in 4/16 with CHF in the setting of dietary and ?medical noncompliance.  She was diuresed with IV Lasix and discharged.    At last visit, I had her increase torsemide and start on lisinopril.  She says that she is now taking the torsemide at 40 mg bid.  Weight is down 4 lbs.  Her breathing is better subjectively.  She is short of breath walking up steps but not walking on flat ground.  She is trying to do more walking.  No PND/orthopnea, no chest pain.    01/30/2015 ECHO EF 20%  11/13/2014: ECHO EF 35-40% RV normal.   Labs 02/05/2015: K 4.7 Creatinine 1.72  Labs 01/28/2015: K 3.4 Creatinine 1.16  Labs 4/16: K 3.3, creatinine 1.72 Labs 6/16: K 2.9, creatinine 0.97, BNP 1536, HCT 42.2 Labs 7/16: K 3.4, creatinine 1.18  ROS: All systems negative except as listed in HPI, PMH and Problem List.  SH:  History   Social History  . Marital Status: Widowed    Spouse Name: N/A  . Number of Children: N/A  . Years of Education: N/A   Occupational History  . not employed    Social History Main Topics  . Smoking status: Current Every Day Smoker -- 0.10 packs/day for .5 years    Types: Cigarettes  . Smokeless tobacco: Current User     Comment: down to 3 cigarettes daily (08/03/14)  . Alcohol Use: No  . Drug Use: No  . Sexual Activity: Not on file    Other Topics Concern  . Not on file   Social History Narrative   Patient has 6 brothers and sisters and none have known coronary artery disease. She lives alone in [place], but has several brothers in the area.    FH:  Family History  Problem Relation Age of Onset  . Diabetes Mother   . Hypertension Mother     Past Medical History  Diagnosis Date  . GERD (gastroesophageal reflux disease)     Probable  . CAD (coronary artery disease) 05/03/14; 05/09/14    a. anterior STEMI with early in-stent thorombosis for missed dose of Brillinta s/p PCI with DESx 2 into LAD (04/2014)  . HTN (hypertension)   . Hyperlipidemia   . Tobacco use   . Non compliance w medication regimen   . Ischemic cardiomyopathy     a. 04/2014 ECHO with EF 45-50% b.  Repeat 2D echo 08/14/14 with EF down at 15%. Life vest placed    Current Outpatient Prescriptions  Medication Sig Dispense Refill  . atorvastatin (LIPITOR) 80 MG tablet Take 0.5 tablets (40 mg total) by mouth daily at 6 PM. 30 tablet 6  . cetirizine (ZYRTEC) 10 MG tablet Take 10 mg by mouth daily as needed for allergies.    . cholecalciferol (VITAMIN D)  1000 UNITS tablet Take 2,000 Units by mouth daily.    Marland Kitchen docusate sodium (COLACE) 100 MG capsule Take 100 mg by mouth daily as needed for mild constipation.    Marland Kitchen lisinopril (PRINIVIL,ZESTRIL) 5 MG tablet Take 1 tablet (5 mg total) by mouth daily. 30 tablet 3  . metolazone (ZAROXOLYN) 2.5 MG tablet Take 1 tablet (2.5 mg total) by mouth once a week. Take 30 minutes before AM Torsemide every Monday 10 tablet 6  . nitroGLYCERIN (NITROSTAT) 0.4 MG SL tablet Place 0.4 mg under the tongue every 5 (five) minutes as needed for chest pain.    . potassium chloride SA (K-DUR,KLOR-CON) 20 MEQ tablet Take 2 tablets (40 mEq total) by mouth 2 (two) times daily. 120 tablet 3  . ticagrelor (BRILINTA) 60 MG TABS tablet Take 1 tablet (60 mg total) by mouth 2 (two) times daily. 60 tablet 3  . torsemide (DEMADEX) 20 MG  tablet Take 60 mg (3 tabs) in the AM and 40 mg ( 2 tabs) in the PM 150 tablet 3  . carvedilol (COREG) 6.25 MG tablet Take 1 tablet (6.25 mg total) by mouth 2 (two) times daily with a meal. 60 tablet 3   No current facility-administered medications for this encounter.    Filed Vitals:   05/28/15 1405  BP: 144/89  Pulse: 94  Resp: 18  Weight: 189 lb 8 oz (85.957 kg)  SpO2: 99%    PHYSICAL EXAM: General:  Well appearing. No resp difficulty HEENT: normal Neck: supple. JVP 10-12 cm. Carotids 2+ bilaterally; no bruits. No lymphadenopathy or thryomegaly appreciated. Cor: PMI normal. Mildly tachycardic, regular rate & rhythm. No rubs, gallops or murmurs. Lungs: Slight crackles at bases bilaterally.  Abdomen: soft, nontender, nondistended. No hepatosplenomegaly. No bruits or masses. Good bowel sounds. Extremities: no cyanosis, clubbing, rash. 1+ edema 1/2 to knees bilaterally.  Neuro: alert & orientedx3, cranial nerves grossly intact. Moves all 4 extremities w/o difficulty. Affect pleasant.  ASSESSMENT & PLAN: 1. Chronic systolic HF:  Ischemic cardiomyopathy, EF 20% by ECHO 01/30/2015. NYHA class II symptoms, seems to be doing better.  Still volume overloaded on exam but looks improved.  I think she has changed her meds as I asked her to at last appointment.   - Increase torsemide to 60 qam, 40 qpm.  She will continue metolazone once a week.  She will take KCl 40 bid. I would like her to return for labs in 2 wks but I am not sure she will be willing to do this.  - Continue lisinopril 5 mg daily.  - Can start Coreg 6.25 mg bid today.   - We will repeat echo in 9/16.  If no improvement in LV function, will need ICD (she is willing to consider this if needed).  - Lengthy discussion about low salt diet, limiting fluids to < 2 liters and daily weights.   2. R pleural effusion: s/p thoracentesis 01/29/15 (transudative).  Likely due to CHF.  3. CAD: s/p anterior MI,  PCI x2 LAD 04/2014.  History of  stent thrombosis after missing Brilinta. No recent chest pain. She is allergic to ASA (angioedema-like reaction).  - She is taking Brilinta as directed at this time. When she runs out of her current Brilinta bottle, I would like her to decrease Brilinta to 60 mg bid for long-term use.  I talked to her about the importance of taking this long-term with history of stent thrombosis.   - She is taking atorvastatin.  Lipids/LFTs at followup.  Followup in 4 wks.   [name] 05/29/2015

## 2015-06-19 ENCOUNTER — Telehealth (HOSPITAL_COMMUNITY): Payer: Self-pay | Admitting: Cardiac Rehabilitation

## 2015-06-19 NOTE — Telephone Encounter (Signed)
Pt has been contacted multiple times to enroll in cardiac rehab.  Pt undecided due to insurance copay.

## 2015-06-28 ENCOUNTER — Telehealth (HOSPITAL_COMMUNITY): Payer: Self-pay | Admitting: Surgery

## 2015-06-28 NOTE — Telephone Encounter (Signed)
[facility] informed me that Ms. Batzel no longer wishes to be part of the [employer].

## 2015-07-01 ENCOUNTER — Inpatient Hospital Stay (HOSPITAL_COMMUNITY): Admission: RE | Admit: 2015-07-01 | Payer: Self-pay | Source: Ambulatory Visit

## 2015-07-25 ENCOUNTER — Emergency Department (HOSPITAL_COMMUNITY): Payer: No Typology Code available for payment source

## 2015-07-25 ENCOUNTER — Other Ambulatory Visit: Payer: Self-pay

## 2015-07-25 ENCOUNTER — Emergency Department (HOSPITAL_COMMUNITY)
Admission: EM | Admit: 2015-07-25 | Discharge: 2015-07-25 | Disposition: A | Discharge status: Home / Self Care | Payer: No Typology Code available for payment source | Attending: Emergency Medicine | Admitting: Emergency Medicine

## 2015-07-25 ENCOUNTER — Encounter (HOSPITAL_COMMUNITY): Payer: Self-pay | Admitting: Emergency Medicine

## 2015-07-25 ENCOUNTER — Ambulatory Visit (HOSPITAL_BASED_OUTPATIENT_CLINIC_OR_DEPARTMENT_OTHER)
Admission: RE | Admit: 2015-07-25 | Discharge: 2015-07-25 | Disposition: A | Discharge status: Home / Self Care | Payer: No Typology Code available for payment source | Source: Ambulatory Visit | Attending: Internal Medicine | Admitting: Internal Medicine

## 2015-07-25 DIAGNOSIS — K219 Gastro-esophageal reflux disease without esophagitis: Secondary | ICD-10-CM | POA: Diagnosis not present

## 2015-07-25 DIAGNOSIS — I2510 Atherosclerotic heart disease of native coronary artery without angina pectoris: Secondary | ICD-10-CM | POA: Diagnosis not present

## 2015-07-25 DIAGNOSIS — E669 Obesity, unspecified: Secondary | ICD-10-CM | POA: Insufficient documentation

## 2015-07-25 DIAGNOSIS — R29898 Other symptoms and signs involving the musculoskeletal system: Secondary | ICD-10-CM | POA: Insufficient documentation

## 2015-07-25 DIAGNOSIS — R05 Cough: Secondary | ICD-10-CM | POA: Diagnosis not present

## 2015-07-25 DIAGNOSIS — Z9119 Patient's noncompliance with other medical treatment and regimen: Secondary | ICD-10-CM | POA: Diagnosis not present

## 2015-07-25 DIAGNOSIS — E785 Hyperlipidemia, unspecified: Secondary | ICD-10-CM | POA: Insufficient documentation

## 2015-07-25 DIAGNOSIS — R053 Chronic cough: Secondary | ICD-10-CM

## 2015-07-25 DIAGNOSIS — F17200 Nicotine dependence, unspecified, uncomplicated: Secondary | ICD-10-CM | POA: Insufficient documentation

## 2015-07-25 DIAGNOSIS — I10 Essential (primary) hypertension: Secondary | ICD-10-CM | POA: Insufficient documentation

## 2015-07-25 DIAGNOSIS — R1013 Epigastric pain: Secondary | ICD-10-CM | POA: Diagnosis not present

## 2015-07-25 DIAGNOSIS — I509 Heart failure, unspecified: Secondary | ICD-10-CM | POA: Diagnosis not present

## 2015-07-25 DIAGNOSIS — Z79899 Other long term (current) drug therapy: Secondary | ICD-10-CM | POA: Diagnosis not present

## 2015-07-25 DIAGNOSIS — Z720 Tobacco use: Secondary | ICD-10-CM | POA: Insufficient documentation

## 2015-07-25 DIAGNOSIS — I517 Cardiomegaly: Secondary | ICD-10-CM

## 2015-07-25 DIAGNOSIS — R112 Nausea with vomiting, unspecified: Secondary | ICD-10-CM | POA: Diagnosis present

## 2015-07-25 DIAGNOSIS — R17 Unspecified jaundice: Secondary | ICD-10-CM

## 2015-07-25 DIAGNOSIS — I340 Nonrheumatic mitral (valve) insufficiency: Secondary | ICD-10-CM

## 2015-07-25 LAB — URINALYSIS, ROUTINE W REFLEX MICROSCOPIC
Glucose, UA: NEGATIVE mg/dL
Ketones, ur: NEGATIVE mg/dL
Leukocytes, UA: NEGATIVE
Nitrite: NEGATIVE
Protein, ur: 100 mg/dL — AB
Specific Gravity, Urine: 1.021 (ref 1.005–1.030)
Urobilinogen, UA: 1 mg/dL (ref 0.0–1.0)
pH: 5.5 (ref 5.0–8.0)

## 2015-07-25 LAB — CBC
HCT: 45 % (ref 36.0–46.0)
Hemoglobin: 14.1 g/dL (ref 12.0–15.0)
MCH: 26.9 pg (ref 26.0–34.0)
MCHC: 31.3 g/dL (ref 30.0–36.0)
MCV: 85.9 fL (ref 78.0–100.0)
Platelets: 324 10*3/uL (ref 150–400)
RBC: 5.24 MIL/uL — ABNORMAL HIGH (ref 3.87–5.11)
RDW: 15 % (ref 11.5–15.5)
WBC: 9.9 10*3/uL (ref 4.0–10.5)

## 2015-07-25 LAB — COMPREHENSIVE METABOLIC PANEL
ALT: 19 U/L (ref 14–54)
AST: 29 U/L (ref 15–41)
Albumin: 3.2 g/dL — ABNORMAL LOW (ref 3.5–5.0)
Alkaline Phosphatase: 210 U/L — ABNORMAL HIGH (ref 38–126)
Anion gap: 10 (ref 5–15)
BUN: 22 mg/dL — ABNORMAL HIGH (ref 6–20)
CO2: 30 mmol/L (ref 22–32)
Calcium: 9.5 mg/dL (ref 8.9–10.3)
Chloride: 105 mmol/L (ref 101–111)
Creatinine, Ser: 1.24 mg/dL — ABNORMAL HIGH (ref 0.44–1.00)
GFR calc Af Amer: 52 mL/min — ABNORMAL LOW (ref >60)
GFR calc non Af Amer: 45 mL/min — ABNORMAL LOW (ref >60)
Glucose, Bld: 121 mg/dL — ABNORMAL HIGH (ref 65–99)
Potassium: 3.2 mmol/L — ABNORMAL LOW (ref 3.5–5.1)
Sodium: 145 mmol/L (ref 135–145)
Total Bilirubin: 1.7 mg/dL — ABNORMAL HIGH (ref 0.3–1.2)
Total Protein: 6.5 g/dL (ref 6.5–8.1)

## 2015-07-25 LAB — URINE MICROSCOPIC-ADD ON

## 2015-07-25 LAB — LIPASE, BLOOD: Lipase: 25 U/L (ref 22–51)

## 2015-07-25 LAB — I-STAT TROPONIN, ED: Troponin i, poc: 0.01 ng/mL (ref 0.00–0.08)

## 2015-07-25 MED ORDER — ONDANSETRON HCL 4 MG/2ML IJ SOLN
4.0000 mg | Freq: Once | INTRAMUSCULAR | Status: AC
  Administered 2015-07-25: 4 mg via INTRAVENOUS
  Filled 2015-07-25: qty 2

## 2015-07-25 MED ORDER — SODIUM CHLORIDE 0.9 % IV BOLUS (SEPSIS)
500.0000 mL | Freq: Once | INTRAVENOUS | Status: AC
  Administered 2015-07-25: 500 mL via INTRAVENOUS

## 2015-07-25 MED ORDER — SODIUM CHLORIDE 0.9 % IV BOLUS (SEPSIS): 1000.0000 mL | Freq: Once | INTRAVENOUS | Status: DC

## 2015-07-25 MED ORDER — RANITIDINE HCL 150 MG PO TABS: 150.0000 mg | ORAL_TABLET | Freq: Two times a day (BID) | ORAL | Status: DC

## 2015-07-25 MED ORDER — ONDANSETRON 4 MG PO TBDP: 4.0000 mg | ORAL_TABLET | Freq: Three times a day (TID) | ORAL | Status: DC | PRN

## 2015-07-25 NOTE — Progress Notes (Signed)
*  PRELIMINARY RESULTS* Echocardiogram 2D Echocardiogram has been performed.  Jeryl Columbia 07/25/2015, 4:11 PM

## 2015-07-25 NOTE — ED Notes (Signed)
Patient here with complaint of cough and vomiting while here for echocardiogram. Test was completed but afterward MD there advised patient to come to ED for evaluation of illness. Patient states she has had the cough for a couple months and cannot tolerate laying flat. Reports history of the same which turned out to be pneumonia.

## 2015-07-25 NOTE — ED Provider Notes (Signed)
CSN: [id number]     Arrival date & time 07/25/15  1517 History   First MD Initiated Contact with Patient 07/25/15 1645     Chief Complaint  Patient presents with  . Cough  . Emesis   Patient is a 64 y.o. female presenting with general illness. The history is provided by the patient. No language interpreter was used.  Illness Location:  Epigastrum Quality:  Pain, chronic cough Severity:  Mild Onset quality:  Gradual Timing:  Intermittent Progression:  Waxing and waning Chronicity:  Chronic Context:  PMHx of HTN, HLD, TOB abuse, CAD s/p STEMI, HFrEF (20% on 3/16 echo) presenting with chronic cough & abdominal pain. Productive cough with clear sputum for several months. Denies fever or chills or hemoptysis. Diffuse intermittent abdominal pain onset x1 month ago worse on right. Worse with eating and lying down. Associated with nausea. Patient denies decreased PO intake, hematochezia, melena, urinary symptoms, or previous history of similar symptoms prior to initial onset.  Associated symptoms: abdominal pain, cough and nausea   Associated symptoms: no chest pain, no congestion, no diarrhea, no fever, no shortness of breath and no vomiting     Past Medical History  Diagnosis Date  . GERD (gastroesophageal reflux disease)     Probable  . CAD (coronary artery disease) 05/03/14; 05/09/14    a. anterior STEMI with early in-stent thorombosis for missed dose of Brillinta s/p PCI with DESx 2 into LAD (04/2014)  . HTN (hypertension)   . Hyperlipidemia   . Tobacco use   . Non compliance w medication regimen   . Ischemic cardiomyopathy     a. 04/2014 ECHO with EF 45-50% b.  Repeat 2D echo 08/14/14 with EF down at 15%. Life vest placed   Past Surgical History  Procedure Laterality Date  . Partial hysterectomy    . Cholecystectomy    . Coronary angioplasty with stent placement  05/03/14    STEMI- stent to LAD DES- Xience alpine  . Coronary angioplasty with stent placement  05/09/14    STEMI-  overlapping stent to LAD, pt had missed a dose of Brilinta  . Left heart catheterization with coronary angiogram N/A 05/03/2014    Procedure: LEFT HEART CATHETERIZATION WITH CORONARY ANGIOGRAM;  Surgeon: Peter M Swaziland, MD;  Location: [facility] CATH LAB;  Service: Cardiovascular;  Laterality: N/A;  . Percutaneous stent intervention  05/03/2014    Procedure: PERCUTANEOUS STENT INTERVENTION;  Surgeon: Peter M Swaziland, MD;  Location: [facility] CATH LAB;  Service: Cardiovascular;;  DES Prox LAD   . Left heart catheterization with coronary angiogram N/A 05/09/2014    Procedure: LEFT HEART CATHETERIZATION WITH CORONARY ANGIOGRAM;  Surgeon: Corky Crafts, MD;  Location: [facility] CATH LAB;  Service: Cardiovascular;  Laterality: N/A;   Family History  Problem Relation Age of Onset  . Diabetes Mother   . Hypertension Mother    Social History  Substance Use Topics  . Smoking status: Current Every Day Smoker -- 0.10 packs/day for .5 years    Types: Cigarettes  . Smokeless tobacco: Current User     Comment: down to 3 cigarettes daily (08/03/14)  . Alcohol Use: No   OB History    No data available      Review of Systems  Constitutional: Negative for fever.  HENT: Negative for congestion.   Respiratory: Positive for cough. Negative for shortness of breath.   Cardiovascular: Negative for chest pain.  Gastrointestinal: Positive for nausea and abdominal pain. Negative for vomiting, diarrhea, constipation, blood in stool,  abdominal distention, anal bleeding and rectal pain.  All other systems reviewed and are negative.   Allergies  Aspirin; Effient; Lactose intolerance (gi); Robitussin dm; Sulfa antibiotics; and Wheat bran  Home Medications   Prior to Admission medications   Medication Sig Start Date End Date Taking? Authorizing Provider  atorvastatin (LIPITOR) 80 MG tablet Take 0.5 tablets (40 mg total) by mouth daily at 6 PM. 03/06/15  Yes Laurey Morale, MD  carvedilol (COREG) 6.25 MG tablet Take 1 tablet  (6.25 mg total) by mouth 2 (two) times daily with a meal. Patient taking differently: Take 6.25 mg by mouth 2 (two) times daily with a meal. Take as needed 05/28/15  Yes Laurey Morale, MD  cetirizine (ZYRTEC) 10 MG tablet Take 10 mg by mouth daily as needed for allergies.   Yes Historical Provider, MD  cholecalciferol (VITAMIN D) 1000 UNITS tablet Take 2,000 Units by mouth daily as needed. Patient states she only takes it when she thinks about it   Yes Historical Provider, MD  hydrochlorothiazide (HYDRODIURIL) 25 MG tablet Take 25 mg by mouth daily. 04/25/15  Yes Historical Provider, MD  metolazone (ZAROXOLYN) 2.5 MG tablet Take 1 tablet (2.5 mg total) by mouth once a week. Take 30 minutes before AM Torsemide every Monday 05/14/15  Yes Laurey Morale, MD  nitroGLYCERIN (NITROSTAT) 0.4 MG SL tablet Place 0.4 mg under the tongue every 5 (five) minutes as needed for chest pain.   Yes Historical Provider, MD  potassium chloride SA (K-DUR,KLOR-CON) 20 MEQ tablet Take 2 tablets (40 mEq total) by mouth 2 (two) times daily. 05/28/15  Yes Laurey Morale, MD  ticagrelor (BRILINTA) 60 MG TABS tablet Take 1 tablet (60 mg total) by mouth 2 (two) times daily. 05/28/15  Yes Laurey Morale, MD  torsemide (DEMADEX) 20 MG tablet Take 60 mg (3 tabs) in the AM and 40 mg ( 2 tabs) in the PM Patient taking differently: Take 20 mg by mouth 2 (two) times daily.  05/28/15  Yes Laurey Morale, MD  lisinopril (PRINIVIL,ZESTRIL) 5 MG tablet Take 1 tablet (5 mg total) by mouth daily. Patient not taking: Reported on 07/25/2015 05/14/15   Laurey Morale, MD  ondansetron (ZOFRAN ODT) 4 MG disintegrating tablet Take 1 tablet (4 mg total) by mouth every 8 (eight) hours as needed for nausea or vomiting. 07/25/15   Angelina Ok, MD  ranitidine (ZANTAC) 150 MG tablet Take 1 tablet (150 mg total) by mouth 2 (two) times daily. 07/25/15   Angelina Ok, MD   BP 152/103 mmHg  Pulse 91  Temp(Src) 98.2 F (36.8 C) (Oral)  Resp 22  Ht 5\' 5"   (1.651 m)  Wt 212 lb (96.163 kg)  BMI 35.28 kg/m2  SpO2 98%   Physical Exam  Constitutional: She is oriented to person, place, and time. No distress.  Exam above notable for a mildly obese female on a stretcher in acute distress.  HENT:  Head: Normocephalic and atraumatic.  Eyes: Conjunctivae are normal. Pupils are equal, round, and reactive to light.  Neck: Normal range of motion. Neck supple.  Cardiovascular: Regular rhythm and intact distal pulses.   Hypertensive  Pulmonary/Chest: Effort normal and breath sounds normal.  Abdominal: Soft. She exhibits no distension. There is tenderness. There is no rebound and no guarding.  Neurological: She is alert and oriented to person, place, and time.  Skin: Skin is warm and dry. She is not diaphoretic.    ED Course  Procedures  Labs Review Labs Reviewed  COMPREHENSIVE METABOLIC PANEL - Abnormal; Notable for the following:    Potassium 3.2 (*)    Glucose, Bld 121 (*)    BUN 22 (*)    Creatinine, Ser 1.24 (*)    Albumin 3.2 (*)    Alkaline Phosphatase 210 (*)    Total Bilirubin 1.7 (*)    GFR calc non Af Amer 45 (*)    GFR calc Af Amer 52 (*)    All other components within normal limits  CBC - Abnormal; Notable for the following:    RBC 5.24 (*)    All other components within normal limits  URINALYSIS, ROUTINE W REFLEX MICROSCOPIC (NOT AT [facility]) - Abnormal; Notable for the following:    Color, Urine AMBER (*)    APPearance CLOUDY (*)    Hgb urine dipstick SMALL (*)    Bilirubin Urine SMALL (*)    Protein, ur 100 (*)    All other components within normal limits  URINE MICROSCOPIC-ADD ON - Abnormal; Notable for the following:    Squamous Epithelial / LPF MANY (*)    Bacteria, UA FEW (*)    Casts HYALINE CASTS (*)    All other components within normal limits  LIPASE, BLOOD  I-STAT TROPOININ, ED   Imaging Review Dg Chest 2 View  07/25/2015   CLINICAL DATA:  Productive cough for 2 months. Vomiting with cough today. History of  hypertension, smoking.  EXAM: CHEST  2 VIEW  COMPARISON:  05/01/2015  FINDINGS: The heart is enlarged. There is right-sided pleural effusion. Right basilar opacity is consistent with atelectasis or infiltrate. No pulmonary edema.  IMPRESSION: 1. Cardiomegaly. 2. Right pleural effusion and basilar atelectasis or infiltrate.   Electronically Signed   By: Norva Pavlov M.D.   On: 07/25/2015 16:25   I have personally reviewed and evaluated these images and lab results as part of my medical decision-making.   EKG Interpretation   Date/Time:  Thursday July 25 2015 17:34:06 EDT Ventricular Rate:  93 PR Interval:  161 QRS Duration: 87 QT Interval:  382 QTC Calculation: 475 R Axis:   171 Text Interpretation:  Sinus rhythm Probable left atrial enlargement  Anterior infarct, old No significant change since last tracing Confirmed  by Rhunette Croft, MD, Janey Genta [phone]) on 07/25/2015 7:35:44 PM      MDM  Ms. Molnar is a 64 yo female w/ PMHx of HTN, HLD, TOB abuse, CAD s/p STEMI, HFrEF (20% on 3/16 echo) presenting with chronic cough & abdominal pain. Productive cough with clear sputum for several months. Denies fever or chills or hemoptysis. Diffuse intermittent abdominal pain onset x1 month ago worse on right. Worse with eating and lying down. Associated with nausea. Patient denies decreased PO intake, hematochezia, melena, urinary symptoms, or previous history of similar symptoms prior to initial onset. Patient in echo this morning and began experiencing N/V and was sent to the ED for further evaluation. PSHx including partial hysterectomy & cholecystectomy (both remote) Last BM this morning.  Exam above notable for a mildly obese female on a stretcher in acute distress. Afebrile. Heart rate 80s 90s. Hypertensive to 170s/100's. Abdomen obese with tenderness to palpation over epigastrium & RUQ. No CVA TTP.  EKG showing no ST elevation/depression or T-wave changes. Troponin 0.01. UA showing few bacteria  and many epithelial cells but negative nitrate and leukocytes. CMP notable for creatinine 1.24 (unchanged from baseline) and T bili 1.7 (baseline). WBC not elevated. Hemoglobin normal. Chest x-ray showing cardiac irregularly and  small right pleural effusion which review of previous chest x-rays appears to be improved.  Given chronic, epigastric discomfort worse with lying down after eating, previous history of GERD patient presentation consistent with gastritis vs hiatal hernia. T bili elevated which could be related to hepatic congestion from advanced heart failure - patient will follow-up with her PCP for an outpatient RUQ [place]. Prescription for Zofran and Zantac given.  Patient discharged home in stable condition. Strict ED return precautions discussed. Patient understands agrees with plan and has no further questions concerning this time.  Patient care discussed with followed by my attending, Dr. Derwood Kaplan  Final diagnoses:  Elevated bilirubin  Epigastric pain  Chronic cough    Angelina Ok, MD 07/25/15 2340  Derwood Kaplan, MD 07/26/15 [phone]

## 2015-08-05 ENCOUNTER — Telehealth (HOSPITAL_COMMUNITY): Payer: Self-pay | Admitting: *Deleted

## 2015-08-05 ENCOUNTER — Other Ambulatory Visit (HOSPITAL_COMMUNITY): Payer: Self-pay | Admitting: Cardiology

## 2015-08-05 DIAGNOSIS — I5022 Chronic systolic (congestive) heart failure: Secondary | ICD-10-CM

## 2015-08-05 NOTE — Telephone Encounter (Signed)
Referred pt to EP

## 2015-08-06 ENCOUNTER — Encounter: Payer: Self-pay | Admitting: Cardiology

## 2015-08-10 ENCOUNTER — Emergency Department (HOSPITAL_COMMUNITY)
Admission: EM | Admit: 2015-08-10 | Discharge: 2015-08-10 | Disposition: A | Discharge status: Home / Self Care | Payer: No Typology Code available for payment source | Attending: Emergency Medicine | Admitting: Emergency Medicine

## 2015-08-10 ENCOUNTER — Encounter (HOSPITAL_COMMUNITY): Payer: Self-pay

## 2015-08-10 ENCOUNTER — Emergency Department (HOSPITAL_COMMUNITY): Payer: No Typology Code available for payment source

## 2015-08-10 DIAGNOSIS — I10 Essential (primary) hypertension: Secondary | ICD-10-CM | POA: Diagnosis not present

## 2015-08-10 DIAGNOSIS — I2510 Atherosclerotic heart disease of native coronary artery without angina pectoris: Secondary | ICD-10-CM | POA: Diagnosis not present

## 2015-08-10 DIAGNOSIS — Z720 Tobacco use: Secondary | ICD-10-CM | POA: Diagnosis not present

## 2015-08-10 DIAGNOSIS — E876 Hypokalemia: Secondary | ICD-10-CM | POA: Insufficient documentation

## 2015-08-10 DIAGNOSIS — Z9861 Coronary angioplasty status: Secondary | ICD-10-CM | POA: Insufficient documentation

## 2015-08-10 DIAGNOSIS — R635 Abnormal weight gain: Secondary | ICD-10-CM | POA: Insufficient documentation

## 2015-08-10 DIAGNOSIS — E785 Hyperlipidemia, unspecified: Secondary | ICD-10-CM | POA: Insufficient documentation

## 2015-08-10 DIAGNOSIS — I5020 Unspecified systolic (congestive) heart failure: Secondary | ICD-10-CM | POA: Insufficient documentation

## 2015-08-10 DIAGNOSIS — Z79899 Other long term (current) drug therapy: Secondary | ICD-10-CM | POA: Insufficient documentation

## 2015-08-10 DIAGNOSIS — M7989 Other specified soft tissue disorders: Secondary | ICD-10-CM | POA: Diagnosis present

## 2015-08-10 DIAGNOSIS — K219 Gastro-esophageal reflux disease without esophagitis: Secondary | ICD-10-CM | POA: Diagnosis not present

## 2015-08-10 DIAGNOSIS — Z9114 Patient's other noncompliance with medication regimen: Secondary | ICD-10-CM | POA: Diagnosis not present

## 2015-08-10 LAB — COMPREHENSIVE METABOLIC PANEL
ALT: 15 U/L (ref 14–54)
AST: 26 U/L (ref 15–41)
Albumin: 3.2 g/dL — ABNORMAL LOW (ref 3.5–5.0)
Alkaline Phosphatase: 190 U/L — ABNORMAL HIGH (ref 38–126)
Anion gap: 13 (ref 5–15)
BUN: 19 mg/dL (ref 6–20)
CO2: 25 mmol/L (ref 22–32)
Calcium: 9.3 mg/dL (ref 8.9–10.3)
Chloride: 106 mmol/L (ref 101–111)
Creatinine, Ser: 1.2 mg/dL — ABNORMAL HIGH (ref 0.44–1.00)
GFR calc Af Amer: 55 mL/min — ABNORMAL LOW (ref >60)
GFR calc non Af Amer: 47 mL/min — ABNORMAL LOW (ref >60)
Glucose, Bld: 103 mg/dL — ABNORMAL HIGH (ref 65–99)
Potassium: 3 mmol/L — ABNORMAL LOW (ref 3.5–5.1)
Sodium: 144 mmol/L (ref 135–145)
Total Bilirubin: 1.5 mg/dL — ABNORMAL HIGH (ref 0.3–1.2)
Total Protein: 6.3 g/dL — ABNORMAL LOW (ref 6.5–8.1)

## 2015-08-10 LAB — CBC WITH DIFFERENTIAL/PLATELET
Basophils Absolute: 0.1 10*3/uL (ref 0.0–0.1)
Basophils Relative: 1 %
Eosinophils Absolute: 0.1 10*3/uL (ref 0.0–0.7)
Eosinophils Relative: 2 %
HCT: 43.9 % (ref 36.0–46.0)
Hemoglobin: 13.7 g/dL (ref 12.0–15.0)
Lymphocytes Relative: 23 %
Lymphs Abs: 1.9 10*3/uL (ref 0.7–4.0)
MCH: 26.4 pg (ref 26.0–34.0)
MCHC: 31.2 g/dL (ref 30.0–36.0)
MCV: 84.6 fL (ref 78.0–100.0)
Monocytes Absolute: 0.6 10*3/uL (ref 0.1–1.0)
Monocytes Relative: 7 %
Neutro Abs: 5.7 10*3/uL (ref 1.7–7.7)
Neutrophils Relative %: 67 %
Platelets: 317 10*3/uL (ref 150–400)
RBC: 5.19 MIL/uL — ABNORMAL HIGH (ref 3.87–5.11)
RDW: 15.6 % — ABNORMAL HIGH (ref 11.5–15.5)
WBC: 8.4 10*3/uL (ref 4.0–10.5)

## 2015-08-10 LAB — BRAIN NATRIURETIC PEPTIDE: B Natriuretic Peptide: 2104.4 pg/mL — ABNORMAL HIGH (ref 0.0–100.0)

## 2015-08-10 MED ORDER — DEXTROMETHORPHAN POLISTIREX ER 30 MG/5ML PO SUER
30.0000 mg | Freq: Once | ORAL | Status: AC
  Administered 2015-08-10: 30 mg via ORAL
  Filled 2015-08-10: qty 5

## 2015-08-10 MED ORDER — FUROSEMIDE 40 MG PO TABS: 40.0000 mg | ORAL_TABLET | Freq: Two times a day (BID) | ORAL | Status: DC

## 2015-08-10 MED ORDER — POTASSIUM CHLORIDE CRYS ER 20 MEQ PO TBCR
40.0000 meq | EXTENDED_RELEASE_TABLET | Freq: Once | ORAL | Status: AC
  Administered 2015-08-10: 40 meq via ORAL
  Filled 2015-08-10: qty 2

## 2015-08-10 MED ORDER — FUROSEMIDE 20 MG PO TABS
40.0000 mg | ORAL_TABLET | Freq: Once | ORAL | Status: AC
  Administered 2015-08-10: 40 mg via ORAL
  Filled 2015-08-10: qty 2

## 2015-08-10 NOTE — Discharge Instructions (Signed)
Take the Lasix, twice a day, until you see your cardiologist in 3 or 4 days. Also, while you're on the Lasix, use your potassium, 40 mEq 3 times a day, to maintain a normal potassium level.   Left Ventricular Dysfunction Left ventricular dysfunction means that the main pumping chamber of your heart (left ventricle) is not working well. The heart is a muscle and needs a constant supply of oxygen and blood. If the arteries that deliver the oxygen and blood are blocked, parts of the heart may get weak and not pump well. The heart may also get weak from having to constantly pump against the resistance of high blood pressure or narrow arteries. CAUSES  Damage after a heart attack.  High blood pressure.  Leaking or malfunctioning heart valves.  Toxins such as alcohol.  Metabolic problems such as:  Vitamin deficiencies.  Thyroid problems.  Diabetes. Heartbeats that are irregular or too fast for a long time. SYMPTOMS Symptoms may progress to include:  Fatigue.  Reduced ability to exercise.  Trouble breathing, especially when active.  Shortness of breath at rest.  Trouble breathing when lying down at night.  Being more tired, light-headed, or confused.  Weight gain with swelling of the legs and feet.  Passing out.  Sudden cardiac death. DIAGNOSIS  Your doctor may find that your heart pumping function is reduced after taking a medical history and doing a physical exam.   Clues from your exam may include:  The veins in your neck are too big.  An irregular heartbeat.  Abnormal heart sounds.  Sounds in your chest from fluid in your lungs.  Swelling in your ankles or feet.  Tender places in your belly plus swelling.  Other studies that may be done include:  Chest X-ray.  ECG (a recording of the electrical activity of your heart).  Echocardiogram (a picture of your heart made by using sound waves).  Holter monitoring (portable longer term checking of your heart  rhythm).  Cardiac stress testing to see how well your heart is pumping.  Angiogram to look for any blocked arteries. HOME CARE INSTRUCTIONS  Activity level--- Your caregiver will help you determine what type of exercise program may be helpful. It is important to maintain strength and increase it if possible. Pace your activities and avoid shortness of breath or chest pain. Plan activities for at least an hour after meals or before eating. This allows your body to handle one activity at a time. Your caregiver can help advise you.  Diet--- Maintain a low-salt and heart-healthy diet, or as directed by your caregiver. Get diet information from your caregiver or a registered dietitian. Keep your salt shaker out of sight and avoid adding salt to your foods. Measure the amounts of fluids you take in per day in cups and record these amounts.  Medications--- You may have been prescribed an ACE inhibitor or a beta-blocker to take for your heart failure. Take these medications as directed. They improve your heart function and your survival. Ask your caregiver if taking statins (drugs to lower cholesterol) would be helpful. Diuretics or water pills may be prescribed to help get rid of fluids. You may also be given a potassium supplement to replace the amount lost from the water pills.  Weight monitoring---Record your hospital or clinic weight. When you get home, compare it to your scale and record your weight. Weigh twice per day at first and record these weights. Try to weigh at the same time every day. It  is best to weigh first thing in the morning, in your same clothes, after going to the bathroom and before eating or drinking anything. Place the scale on a hard-surfaced floor. Bring these weights to your caregiver to be reviewed during your appointments.  Blood pressure monitoring should be done twice a day at first, morning and evening, when you are relaxed. Once your blood pressure has stabilized,  rechecking once a day and then a few times a week may be enough. You can get a home blood pressure cuff at your drugstore. Record these values and bring them with you for your clinic visits. Notify your caregiver if you become dizzy or lightheaded upon standing up.  Be familiar with your medications--- If you have trouble remembering when you took them, write down times or set your medications out in advance for the day or the week to avoid problems. If you take your medications twice a day, place them by your toothbrush and get in the habit of brushing your teeth twice a day. If you are on a water pill (diuretic), take these in the morning so you are not up all night going to the bathroom.  If you are currently a smoker, it is time to quit. Nicotine makes your heart work harder and is one of the leading causes of cardiac (heart) deaths. Do not leave your doctor's office without a smoking cessation plan or instructions on help available to quit smoking.  Immunization with influenza and pneumococcal vaccines may reduce the risk of respiratory infection.  Nonsteroidal anti-inflammatory drugs should not be used. They can cause salt (sodium) retention and also may impair the action of diuretics and ACE inhibitors. They can also elevate blood pressure. This includes drugs such as Advil, Motrin, ibuprofen, etc.  Aldosterone antagonists such as spironolactone may have helpful effects and may be used if there are symptoms at rest despite the use of diuretics, an ACE inhibitor, and (usually) a beta-blocker. Aldosterone antagonism is recommended in advanced heart failure in addition to ACE inhibition and diuretics to improve survival and decrease chances of other problems.  If you do not follow your diet and take your medications properly, this may lead to a rapid decline in your health, emergency care, or hospitalization. Follow the advice of your caregiver.  What to do if symptoms worsen--- If there are  immediate problems go to the Emergency Department. This would include any symptoms which brought you in and which are getting worse rather than better. Call your local emergency services (911 in [place] for immediate care. DAILY PATH TO QUALITY LIVING  Monitor weight and record.  Monitor blood pressure and record.  Monitor fluid intake.  Monitor sodium intake.  Monitor activity levels.  Take your medications.  Stop all use of nicotine.  Know when to call for help and do so. SEEK IMMEDIATE MEDICAL CARE IF:   Your weight increases by 03 lb/1.4 kg in 1 day or 05 lb/2.3 kg in a week, or as your caregiver suggests.  You notice increasing shortness of breath during rest, sleeping, or with activity, and which is unusual for you.  You develop an increase in angina, or develop chest pain which is unusual for you.  You develop sweating or nausea, which is unusual for you.  You notice swelling in your hands, feet, ankles, or abdomen.  You have a feeling of fullness in your abdomen or develop nausea or loss of appetite.  You notice lasting (persistent) dizziness, blurred vision, headache, or unsteadiness.  Make an appointment with your caregiver as directed for follow-up. Document Released: 01/20/2005 Document Revised: 03/19/2014 Document Reviewed: 03/01/2008 [facility] Patient Information 2015 [place], [place]. This information is not intended to replace advice given to you by your health care provider. Make sure you discuss any questions you have with your health care provider.  Hypokalemia Hypokalemia means that the amount of potassium in the blood is lower than normal.Potassium is a chemical, called an electrolyte, that helps regulate the amount of fluid in the body. It also stimulates muscle contraction and helps nerves function properly.Most of the body's potassium is inside of cells, and only a very small amount is in the blood. Because the amount in the blood is so small, minor changes  can be life-threatening. CAUSES  Antibiotics.  Diarrhea or vomiting.  Using laxatives too much, which can cause diarrhea.  Chronic kidney disease.  Water pills (diuretics).  Eating disorders (bulimia).  Low magnesium level.  Sweating a lot. SIGNS AND SYMPTOMS  Weakness.  Constipation.  Fatigue.  Muscle cramps.  Mental confusion.  Skipped heartbeats or irregular heartbeat (palpitations).  Tingling or numbness. DIAGNOSIS  Your health care provider can diagnose hypokalemia with blood tests. In addition to checking your potassium level, your health care provider may also check other lab tests. TREATMENT Hypokalemia can be treated with potassium supplements taken by mouth or adjustments in your current medicines. If your potassium level is very low, you may need to get potassium through a vein (IV) and be monitored in the hospital. A diet high in potassium is also helpful. Foods high in potassium are:  Nuts, such as peanuts and pistachios.  Seeds, such as sunflower seeds and pumpkin seeds.  Peas, lentils, and lima beans.  Whole grain and bran cereals and breads.  Fresh fruit and vegetables, such as apricots, avocado, bananas, cantaloupe, kiwi, oranges, tomatoes, asparagus, and potatoes.  Orange and tomato juices.  Red meats.  Fruit yogurt. HOME CARE INSTRUCTIONS  Take all medicines as prescribed by your health care provider.  Maintain a healthy diet by including nutritious food, such as fruits, vegetables, nuts, whole grains, and lean meats.  If you are taking a laxative, be sure to follow the directions on the label. SEEK MEDICAL CARE IF:  Your weakness gets worse.  You feel your heart pounding or racing.  You are vomiting or having diarrhea.  You are diabetic and having trouble keeping your blood glucose in the normal range. SEEK IMMEDIATE MEDICAL CARE IF:  You have chest pain, shortness of breath, or dizziness.  You are vomiting or having  diarrhea for more than 2 days.  You faint. MAKE SURE YOU:   Understand these instructions.  Will watch your condition.  Will get help right away if you are not doing well or get worse. Document Released: 11/02/2005 Document Revised: 08/23/2013 Document Reviewed: 05/05/2013 [facility] Patient Information 2015 [place], [place]. This information is not intended to replace advice given to you by your health care provider. Make sure you discuss any questions you have with your health care provider.

## 2015-08-10 NOTE — ED Notes (Signed)
Patient states she needs to talk to someone about going home.  Feels she needs to be admitted because she will be right back here for the same thing (swelling in her legs)  Dr Effie Shy notified

## 2015-08-10 NOTE — ED Notes (Signed)
Discharge instructions given voiced understanding

## 2015-08-10 NOTE — ED Notes (Signed)
Pt here with c/o of bilateral leg and abdominal swelling, very painful and is unable to walk due to the pain. She has been taking torsemide but it is not decreasing the swelling. Pt denies SOB. Pt is being seen at the heart and vascular clinic.

## 2015-08-10 NOTE — ED Provider Notes (Signed)
CSN: [id number]     Arrival date & time 08/10/15  1537 History   First MD Initiated Contact with Patient 08/10/15 1939     Chief Complaint  Patient presents with  . Leg Swelling     (Consider location/radiation/quality/duration/timing/severity/associated sxs/prior Treatment) HPI    Deborah Ho is a 64 y.o. female who presents for evaluation of swelling of legs, which has extended to her abdomen and chest last several days. She has a history of congestive heart failure, and his recently been referred to electrophysiologist for placement of ICD. Patient was in the ED several weeks ago after setting of coughing, while she was having a cardiac echo which resulted in vomiting. During the visit, she was found to have some nonspecific liver abnormalities, and was instructed to follow-up with her PCP. Patient does not recall this instruction, and has not done it, yet. She states that she is taking her usual medications, without relief.   Past Medical History  Diagnosis Date  . GERD (gastroesophageal reflux disease)     Probable  . CAD (coronary artery disease) 05/03/14; 05/09/14    a. anterior STEMI with early in-stent thorombosis for missed dose of Brillinta s/p PCI with DESx 2 into LAD (04/2014)  . HTN (hypertension)   . Hyperlipidemia   . Tobacco use   . Non compliance w medication regimen   . Ischemic cardiomyopathy     a. 04/2014 ECHO with EF 45-50% b.  Repeat 2D echo 08/14/14 with EF down at 15%. Life vest placed   Past Surgical History  Procedure Laterality Date  . Partial hysterectomy    . Cholecystectomy    . Coronary angioplasty with stent placement  05/03/14    STEMI- stent to LAD DES- Xience alpine  . Coronary angioplasty with stent placement  05/09/14    STEMI- overlapping stent to LAD, pt had missed a dose of Brilinta  . Left heart catheterization with coronary angiogram N/A 05/03/2014    Procedure: LEFT HEART CATHETERIZATION WITH CORONARY ANGIOGRAM;  Surgeon: Peter M Swaziland,  MD;  Location: [facility] CATH LAB;  Service: Cardiovascular;  Laterality: N/A;  . Percutaneous stent intervention  05/03/2014    Procedure: PERCUTANEOUS STENT INTERVENTION;  Surgeon: Peter M Swaziland, MD;  Location: [facility] CATH LAB;  Service: Cardiovascular;;  DES Prox LAD   . Left heart catheterization with coronary angiogram N/A 05/09/2014    Procedure: LEFT HEART CATHETERIZATION WITH CORONARY ANGIOGRAM;  Surgeon: Corky Crafts, MD;  Location: [facility] CATH LAB;  Service: Cardiovascular;  Laterality: N/A;   Family History  Problem Relation Age of Onset  . Diabetes Mother   . Hypertension Mother    Social History  Substance Use Topics  . Smoking status: Current Every Day Smoker -- 0.10 packs/day for .5 years    Types: Cigarettes  . Smokeless tobacco: Current User     Comment: down to 3 cigarettes daily (08/03/14)  . Alcohol Use: No   OB History    No data available     Review of Systems  All other systems reviewed and are negative.     Allergies  Aspirin; Effient; Lactose intolerance (gi); Robitussin dm; Sulfa antibiotics; and Wheat bran  Home Medications   Prior to Admission medications   Medication Sig Start Date End Date Taking? Authorizing Provider  atorvastatin (LIPITOR) 80 MG tablet Take 0.5 tablets (40 mg total) by mouth daily at 6 PM. 03/06/15  Yes Laurey Morale, MD  cetirizine (ZYRTEC) 10 MG tablet Take 10 mg by mouth  daily as needed for allergies.   Yes Historical Provider, MD  cholecalciferol (VITAMIN D) 1000 UNITS tablet Take 2,000 Units by mouth daily as needed. Patient states she only takes it when she thinks about it   Yes Historical Provider, MD  famotidine (PEPCID AC) 10 MG chewable tablet Chew 10 mg by mouth 2 (two) times daily as needed for heartburn.   Yes Historical Provider, MD  hydrochlorothiazide (HYDRODIURIL) 25 MG tablet Take 25 mg by mouth daily. 04/25/15  Yes Historical Provider, MD  metolazone (ZAROXOLYN) 2.5 MG tablet Take 1 tablet (2.5 mg total) by mouth once  a week. Take 30 minutes before AM Torsemide every Monday Patient taking differently: Take 2.5 mg by mouth daily as needed (FOR WEIGHT GAIN FOR 5 LBS.). Take 30 minutes before AM Torsemide every Monday 05/14/15  Yes Laurey Morale, MD  nitroGLYCERIN (NITROSTAT) 0.4 MG SL tablet Place 0.4 mg under the tongue every 5 (five) minutes as needed for chest pain.   Yes Historical Provider, MD  potassium chloride SA (K-DUR,KLOR-CON) 20 MEQ tablet Take 2 tablets (40 mEq total) by mouth 2 (two) times daily. 05/28/15  Yes Laurey Morale, MD  ranitidine (ZANTAC) 150 MG tablet Take 1 tablet (150 mg total) by mouth 2 (two) times daily. 07/25/15  Yes Angelina Ok, MD  torsemide (DEMADEX) 20 MG tablet Take 60 mg (3 tabs) in the AM and 40 mg ( 2 tabs) in the PM Patient taking differently: Take 20 mg by mouth 2 (two) times daily.  05/28/15  Yes Laurey Morale, MD  carvedilol (COREG) 6.25 MG tablet Take 1 tablet (6.25 mg total) by mouth 2 (two) times daily with a meal. Patient taking differently: Take 6.25 mg by mouth 2 (two) times daily with a meal. Take as needed 05/28/15   Laurey Morale, MD  furosemide (LASIX) 40 MG tablet Take 1 tablet (40 mg total) by mouth 2 (two) times daily. 08/10/15   Mancel Bale, MD  lisinopril (PRINIVIL,ZESTRIL) 5 MG tablet Take 1 tablet (5 mg total) by mouth daily. Patient not taking: Reported on 07/25/2015 05/14/15   Laurey Morale, MD  ondansetron (ZOFRAN ODT) 4 MG disintegrating tablet Take 1 tablet (4 mg total) by mouth every 8 (eight) hours as needed for nausea or vomiting. 07/25/15   Angelina Ok, MD  ticagrelor (BRILINTA) 60 MG TABS tablet Take 1 tablet (60 mg total) by mouth 2 (two) times daily. 05/28/15   Laurey Morale, MD   BP 161/104 mmHg  Pulse 93  Temp(Src) 97.9 F (36.6 C) (Oral)  Resp 28  Ht 5\' 5"  (1.651 m)  Wt 220 lb 3.2 oz (99.882 kg)  BMI 36.64 kg/m2  SpO2 98% Physical Exam  Constitutional: She is oriented to person, place, and time. She appears well-developed and  well-nourished.  HENT:  Head: Normocephalic and atraumatic.  Right Ear: External ear normal.  Left Ear: External ear normal.  Eyes: Conjunctivae and EOM are normal. Pupils are equal, round, and reactive to light.  Neck: Normal range of motion and phonation normal. Neck supple.  Cardiovascular: Normal rate, regular rhythm and normal heart sounds.   Pulmonary/Chest: Effort normal and breath sounds normal. She exhibits no bony tenderness.  Mild nonproductive cough which resulted in vomiting clear mucus, while the patient was being examined.  Abdominal: Soft. There is no tenderness.  Musculoskeletal: Normal range of motion.  Neurological: She is alert and oriented to person, place, and time. No cranial nerve deficit or sensory deficit. She exhibits  normal muscle tone. Coordination normal.  Skin: Skin is warm, dry and intact.  Psychiatric: She has a normal mood and affect. Her behavior is normal. Judgment and thought content normal.  Nursing note and vitals reviewed.   ED Course  Procedures (including critical care time)  Medications  dextromethorphan (DELSYM) 30 MG/5ML liquid 30 mg (30 mg Oral Given 08/10/15 2031)  furosemide (LASIX) tablet 40 mg (40 mg Oral Given 08/10/15 2220)  potassium chloride SA (K-DUR,KLOR-CON) CR tablet 40 mEq (40 mEq Oral Given 08/10/15 2231)    Patient Vitals for the past 24 hrs:  BP Temp Temp src Pulse Resp SpO2 Height Weight  08/10/15 2318 (!) 161/104 mmHg - - 93 (!) 28 98 % - -  08/10/15 2300 (!) 164/106 mmHg - - 92 25 99 % - -  08/10/15 2130 (!) 157/111 mmHg - - 90 26 98 % - -  08/10/15 2125 162/99 mmHg - - - - - - -  08/10/15 2030 (!) 154/101 mmHg - - 91 22 99 % - -  08/10/15 2000 (!) 164/120 mmHg - - 91 (!) 33 98 % - -  08/10/15 1830 (!) 164/119 mmHg - - 91 25 96 % - -  08/10/15 1722 - - - - - - - 220 lb 3.2 oz (99.882 kg)  08/10/15 1716 (!) 154/106 mmHg - - 93 12 98 % - -  08/10/15 1554 (!) 162/108 mmHg 97.9 F (36.6 C) Oral 93 20 98 % 5\' 5"  (1.651 m)  220 lb (99.791 kg)    Discharge Reevaluation with update and discussion. After initial assessment and treatment, an updated evaluation reveals she is comfortable, findings discussed with the patient, all questions were answered. Deborah Ho L    Labs Review Labs Reviewed  CBC WITH DIFFERENTIAL/PLATELET - Abnormal; Notable for the following:    RBC 5.19 (*)    RDW 15.6 (*)    All other components within normal limits  COMPREHENSIVE METABOLIC PANEL - Abnormal; Notable for the following:    Potassium 3.0 (*)    Glucose, Bld 103 (*)    Creatinine, Ser 1.20 (*)    Total Protein 6.3 (*)    Albumin 3.2 (*)    Alkaline Phosphatase 190 (*)    Total Bilirubin 1.5 (*)    GFR calc non Af Amer 47 (*)    GFR calc Af Amer 55 (*)    All other components within normal limits  BRAIN NATRIURETIC PEPTIDE - Abnormal; Notable for the following:    B Natriuretic Peptide 2104.4 (*)    All other components within normal limits    Imaging Review Dg Chest 2 View  08/10/2015   CLINICAL DATA:  Abdominal and lower extremity swelling for 7 days. Cough and congestion for 4 months. Shortness of breath. Hypertension. Smoker. History of pulmonary edema with thoracentesis 5/16  EXAM: CHEST  2 VIEW  COMPARISON:  07/25/2015  FINDINGS: Cardiac enlargement with mild central pulmonary vascular congestion. Mild interstitial changes suggesting interstitial edema. Small right pleural effusion with basilar atelectasis. Similar appearance to previous study. No pneumothorax.  IMPRESSION: Cardiac enlargement with mild interstitial edema. Small right pleural effusion with basilar atelectasis. No pneumothorax.   Electronically Signed   By: 09/24/2015 M.D.   On: 08/10/2015 21:20   I have personally reviewed and evaluated these images and lab results as part of my medical decision-making.   EKG Interpretation   Date/Time:  Saturday August 10 2015 17:26:11 EDT Ventricular Rate:  94 PR Interval:  173  QRS Duration:  85 QT Interval:  378 QTC Calculation: 473 R Axis:   129 Text Interpretation:  Sinus rhythm Abnormal lateral Q waves Anterior  infarct, old since last tracing no significant change Confirmed by [facility]   MD, Abb Gobert [phone]) on 08/10/2015 8:29:05 PM      MDM   Final diagnoses:  Systolic congestive heart failure, unspecified congestive heart failure chronicity  Weight gain  Hypokalemia    Evaluation is consistent with weight gain related to congestive heart failure, chronic, with approximately 15 kg weight gain over the last 2 months. Patient is being evaluated by cardiology for complications related to CHF with cardiac MRI, and evaluation by electrophysiology. She currently takes Zaroxolyn intermittently. She has had benefit from Lasix in the past.  Congestive heart failure, with significant weight gain of roughly 15 kg over 2 months. No hemodynamic or respiratory instability. Patient will need increased diuresis, and close observation as an outpatient.   Nursing Notes Reviewed/ Care Coordinated Applicable Imaging Reviewed Interpretation of Laboratory Data incorporated into ED treatment  The patient appears reasonably screened and/or stabilized for discharge and I doubt any other medical condition or other [facility] requiring further screening, evaluation, or treatment in the ED at this time prior to discharge.  Plan: Home Medications- Lasix x 7 days and increase Potassium to TID; Home Treatments- rest, elevate legs; return here if the recommended treatment, does not improve the symptoms; Recommended follow up- Cardiology f/u in 3 days   Mancel Bale, MD 08/11/15 1513

## 2015-08-12 ENCOUNTER — Inpatient Hospital Stay (HOSPITAL_COMMUNITY)
Admission: AD | Admit: 2015-08-12 | Discharge: 2015-08-19 | DRG: 291 | Disposition: A | Discharge status: Home Health | Payer: No Typology Code available for payment source | Source: Ambulatory Visit | Attending: Cardiology | Admitting: Cardiology

## 2015-08-12 ENCOUNTER — Ambulatory Visit (HOSPITAL_BASED_OUTPATIENT_CLINIC_OR_DEPARTMENT_OTHER)
Admission: RE | Admit: 2015-08-12 | Discharge: 2015-08-12 | Disposition: A | Discharge status: Home / Self Care | Payer: No Typology Code available for payment source | Source: Ambulatory Visit | Attending: Internal Medicine | Admitting: Internal Medicine

## 2015-08-12 ENCOUNTER — Telehealth: Payer: Self-pay | Admitting: Cardiology

## 2015-08-12 ENCOUNTER — Encounter (HOSPITAL_COMMUNITY): Payer: Self-pay | Admitting: *Deleted

## 2015-08-12 ENCOUNTER — Telehealth (HOSPITAL_COMMUNITY): Payer: Self-pay | Admitting: Vascular Surgery

## 2015-08-12 ENCOUNTER — Ambulatory Visit (HOSPITAL_COMMUNITY)
Admission: RE | Admit: 2015-08-12 | Discharge: 2015-08-12 | Disposition: A | Discharge status: Home / Self Care | Payer: No Typology Code available for payment source | Source: Ambulatory Visit | Attending: Adult Health | Admitting: Adult Health

## 2015-08-12 VITALS — BP 144/106 | HR 93 | Wt 222.0 lb

## 2015-08-12 DIAGNOSIS — I252 Old myocardial infarction: Secondary | ICD-10-CM

## 2015-08-12 DIAGNOSIS — K219 Gastro-esophageal reflux disease without esophagitis: Secondary | ICD-10-CM | POA: Diagnosis present

## 2015-08-12 DIAGNOSIS — E785 Hyperlipidemia, unspecified: Secondary | ICD-10-CM | POA: Diagnosis present

## 2015-08-12 DIAGNOSIS — Z833 Family history of diabetes mellitus: Secondary | ICD-10-CM | POA: Diagnosis not present

## 2015-08-12 DIAGNOSIS — N179 Acute kidney failure, unspecified: Secondary | ICD-10-CM | POA: Diagnosis not present

## 2015-08-12 DIAGNOSIS — E876 Hypokalemia: Secondary | ICD-10-CM | POA: Diagnosis present

## 2015-08-12 DIAGNOSIS — I130 Hypertensive heart and chronic kidney disease with heart failure and stage 1 through stage 4 chronic kidney disease, or unspecified chronic kidney disease: Secondary | ICD-10-CM | POA: Diagnosis present

## 2015-08-12 DIAGNOSIS — Z886 Allergy status to analgesic agent status: Secondary | ICD-10-CM

## 2015-08-12 DIAGNOSIS — I959 Hypotension, unspecified: Secondary | ICD-10-CM | POA: Diagnosis present

## 2015-08-12 DIAGNOSIS — I25810 Atherosclerosis of coronary artery bypass graft(s) without angina pectoris: Secondary | ICD-10-CM

## 2015-08-12 DIAGNOSIS — N183 Chronic kidney disease, stage 3 unspecified: Secondary | ICD-10-CM | POA: Insufficient documentation

## 2015-08-12 DIAGNOSIS — I509 Heart failure, unspecified: Secondary | ICD-10-CM

## 2015-08-12 DIAGNOSIS — F17210 Nicotine dependence, cigarettes, uncomplicated: Secondary | ICD-10-CM | POA: Diagnosis present

## 2015-08-12 DIAGNOSIS — Z9861 Coronary angioplasty status: Secondary | ICD-10-CM

## 2015-08-12 DIAGNOSIS — I25118 Atherosclerotic heart disease of native coronary artery with other forms of angina pectoris: Secondary | ICD-10-CM | POA: Diagnosis not present

## 2015-08-12 DIAGNOSIS — I5023 Acute on chronic systolic (congestive) heart failure: Secondary | ICD-10-CM | POA: Diagnosis present

## 2015-08-12 DIAGNOSIS — Z8249 Family history of ischemic heart disease and other diseases of the circulatory system: Secondary | ICD-10-CM | POA: Diagnosis not present

## 2015-08-12 DIAGNOSIS — I255 Ischemic cardiomyopathy: Secondary | ICD-10-CM | POA: Diagnosis present

## 2015-08-12 DIAGNOSIS — N189 Chronic kidney disease, unspecified: Secondary | ICD-10-CM | POA: Diagnosis not present

## 2015-08-12 DIAGNOSIS — I513 Intracardiac thrombosis, not elsewhere classified: Secondary | ICD-10-CM

## 2015-08-12 DIAGNOSIS — I2510 Atherosclerotic heart disease of native coronary artery without angina pectoris: Secondary | ICD-10-CM | POA: Diagnosis present

## 2015-08-12 DIAGNOSIS — I10 Essential (primary) hypertension: Secondary | ICD-10-CM | POA: Diagnosis not present

## 2015-08-12 LAB — COMPREHENSIVE METABOLIC PANEL
ALT: 17 U/L (ref 14–54)
AST: 31 U/L (ref 15–41)
Albumin: 3.4 g/dL — ABNORMAL LOW (ref 3.5–5.0)
Alkaline Phosphatase: 210 U/L — ABNORMAL HIGH (ref 38–126)
Anion gap: 10 (ref 5–15)
BUN: 19 mg/dL (ref 6–20)
CO2: 31 mmol/L (ref 22–32)
Calcium: 9 mg/dL (ref 8.9–10.3)
Chloride: 99 mmol/L — ABNORMAL LOW (ref 101–111)
Creatinine, Ser: 1.3 mg/dL — ABNORMAL HIGH (ref 0.44–1.00)
GFR calc Af Amer: 50 mL/min — ABNORMAL LOW (ref >60)
GFR calc non Af Amer: 43 mL/min — ABNORMAL LOW (ref >60)
Glucose, Bld: 98 mg/dL (ref 65–99)
Potassium: 3.3 mmol/L — ABNORMAL LOW (ref 3.5–5.1)
Sodium: 140 mmol/L (ref 135–145)
Total Bilirubin: 1.8 mg/dL — ABNORMAL HIGH (ref 0.3–1.2)
Total Protein: 6.4 g/dL — ABNORMAL LOW (ref 6.5–8.1)

## 2015-08-12 LAB — CBC WITH DIFFERENTIAL/PLATELET
Basophils Absolute: 0.1 10*3/uL (ref 0.0–0.1)
Basophils Relative: 1 %
Eosinophils Absolute: 0.1 10*3/uL (ref 0.0–0.7)
Eosinophils Relative: 2 %
HCT: 45.4 % (ref 36.0–46.0)
Hemoglobin: 14.1 g/dL (ref 12.0–15.0)
Lymphocytes Relative: 23 %
Lymphs Abs: 2 10*3/uL (ref 0.7–4.0)
MCH: 26.4 pg (ref 26.0–34.0)
MCHC: 31.1 g/dL (ref 30.0–36.0)
MCV: 84.9 fL (ref 78.0–100.0)
Monocytes Absolute: 0.6 10*3/uL (ref 0.1–1.0)
Monocytes Relative: 7 %
Neutro Abs: 5.9 10*3/uL (ref 1.7–7.7)
Neutrophils Relative %: 67 %
Platelets: 353 10*3/uL (ref 150–400)
RBC: 5.35 MIL/uL — ABNORMAL HIGH (ref 3.87–5.11)
RDW: 15.7 % — ABNORMAL HIGH (ref 11.5–15.5)
WBC: 8.7 10*3/uL (ref 4.0–10.5)

## 2015-08-12 LAB — BRAIN NATRIURETIC PEPTIDE: B Natriuretic Peptide: 1799.8 pg/mL — ABNORMAL HIGH (ref 0.0–100.0)

## 2015-08-12 LAB — TROPONIN I
Troponin I: <0.03 ng/mL (ref <0.031)
Troponin I: <0.03 ng/mL (ref <0.031)

## 2015-08-12 LAB — MAGNESIUM: Magnesium: 1.9 mg/dL (ref 1.7–2.4)

## 2015-08-12 LAB — TSH: TSH: 1.481 u[IU]/mL (ref 0.350–4.500)

## 2015-08-12 MED ORDER — SODIUM CHLORIDE 0.9 % IJ SOLN
3.0000 mL | Freq: Two times a day (BID) | INTRAMUSCULAR | Status: DC
Start: 2015-08-12 — End: 2015-08-19
  Administered 2015-08-12 – 2015-08-19 (×14): 3 mL via INTRAVENOUS

## 2015-08-12 MED ORDER — POTASSIUM CHLORIDE CRYS ER 20 MEQ PO TBCR
20.0000 meq | EXTENDED_RELEASE_TABLET | Freq: Two times a day (BID) | ORAL | Status: DC
  Administered 2015-08-12 – 2015-08-13 (×3): 20 meq via ORAL
  Filled 2015-08-12 (×3): qty 1

## 2015-08-12 MED ORDER — SODIUM CHLORIDE 0.9 % IJ SOLN: 3.0000 mL | INTRAMUSCULAR | Status: DC | PRN

## 2015-08-12 MED ORDER — FUROSEMIDE 10 MG/ML IJ SOLN
80.0000 mg | Freq: Two times a day (BID) | INTRAMUSCULAR | Status: DC
  Administered 2015-08-12 – 2015-08-13 (×3): 80 mg via INTRAVENOUS
  Filled 2015-08-12 (×4): qty 8

## 2015-08-12 MED ORDER — ATORVASTATIN CALCIUM 40 MG PO TABS
40.0000 mg | ORAL_TABLET | Freq: Every day | ORAL | Status: DC
  Administered 2015-08-12 – 2015-08-18 (×7): 40 mg via ORAL
  Filled 2015-08-12 (×7): qty 1

## 2015-08-12 MED ORDER — ONDANSETRON HCL 4 MG/2ML IJ SOLN
4.0000 mg | Freq: Four times a day (QID) | INTRAMUSCULAR | Status: DC | PRN
  Administered 2015-08-12: 4 mg via INTRAVENOUS
  Filled 2015-08-12: qty 2

## 2015-08-12 MED ORDER — ACETAMINOPHEN 325 MG PO TABS: 650.0000 mg | ORAL_TABLET | ORAL | Status: DC | PRN

## 2015-08-12 MED ORDER — INFLUENZA VAC SPLIT QUAD 0.5 ML IM SUSY
0.5000 mL | PREFILLED_SYRINGE | INTRAMUSCULAR | Status: AC
  Administered 2015-08-13: 0.5 mL via INTRAMUSCULAR
  Filled 2015-08-12: qty 0.5

## 2015-08-12 MED ORDER — LORATADINE 10 MG PO TABS
10.0000 mg | ORAL_TABLET | Freq: Every day | ORAL | Status: DC | PRN
  Filled 2015-08-12: qty 1

## 2015-08-12 MED ORDER — SPIRONOLACTONE 25 MG PO TABS
25.0000 mg | ORAL_TABLET | Freq: Every day | ORAL | Status: DC
  Administered 2015-08-12 – 2015-08-19 (×8): 25 mg via ORAL
  Filled 2015-08-12 (×8): qty 1

## 2015-08-12 MED ORDER — SODIUM CHLORIDE 0.9 % IV SOLN
250.0000 mL | INTRAVENOUS | Status: DC | PRN
Start: 2015-08-12 — End: 2015-08-19

## 2015-08-12 MED ORDER — LISINOPRIL 5 MG PO TABS
5.0000 mg | ORAL_TABLET | Freq: Two times a day (BID) | ORAL | Status: DC
  Administered 2015-08-12 – 2015-08-13 (×3): 5 mg via ORAL
  Filled 2015-08-12 (×3): qty 1

## 2015-08-12 MED ORDER — ENOXAPARIN SODIUM 40 MG/0.4ML ~~LOC~~ SOLN
40.0000 mg | SUBCUTANEOUS | Status: DC
  Administered 2015-08-12 – 2015-08-18 (×7): 40 mg via SUBCUTANEOUS
  Filled 2015-08-12 (×7): qty 0.4

## 2015-08-12 MED ORDER — TICAGRELOR 60 MG PO TABS
60.0000 mg | ORAL_TABLET | Freq: Two times a day (BID) | ORAL | Status: DC
  Administered 2015-08-13 – 2015-08-19 (×13): 60 mg via ORAL
  Filled 2015-08-12 (×17): qty 1

## 2015-08-12 MED ORDER — HYDROCOD POLST-CPM POLST ER 10-8 MG/5ML PO SUER: 5.0000 mL | Freq: Two times a day (BID) | ORAL | Status: DC | PRN

## 2015-08-12 NOTE — Addendum Note (Signed)
Encounter addended by: Laurey Morale, MD on: 08/12/2015  3:35 PM<BR>     Documentation filed: Charges VN, Notes Section

## 2015-08-12 NOTE — Telephone Encounter (Signed)
Returned call to patient she stated she wanted to know how to take her medication.Stated she was told she can stop Brilinta at some point.She wanted to discuss stents with Dr.Jordan.She also stated she wanted echo results.She is scheduled for another heart test.Patient hard to understand she rambles from one topic to another.While taking to patient when she had echo she stated she coughed and vomited during exam and was told she needed to go to ER for cough.She went to [facility] ER and was given cough med and cough is better.She stated she is swollen from both knees up into abdomen.She feels like abdomen is going to burst so swollen.She cannot walk due to upper legs so swollen.Patient stated she is not sob.Stated she has not taken any medication this morning.Advised to take metolazone 2.5 mg and in 30 min take torsemide as directed.   Spoke to [place] at heart failure clinic she advised patient to come now to be seen.Pt was called back she stated she will call the lady who is helping her, to take her.Stated she will be there within the next 1 hour.

## 2015-08-12 NOTE — Telephone Encounter (Signed)
Pt did get transportation and came to appt today

## 2015-08-12 NOTE — H&P (Signed)
Advanced Heart Failure H&P  PCP: [name] NP-C  Primary HF MD: Dr Shirlee Latch   HPI: [name] is a 64 y.o. female with a history of HTN, HLD, tobacco abuse, CAD s/panterior STEMI with early stent thrombosis for missing doses of Brilinta s/p PCI x 2 into LAD (04/2014) and chronic systolic HF with EF 20% by 3/16 echo.   Admitted to [facility] in 3/16 with CHF and R pleural effusion. Effusion tapped (transudative). Echo with EF down to 20%. Diuresed with IV lasix and transitioned torsemide + metolazone as needed for weight >187 pounds. Creatinine was 1.7 on the day discharge. Discharge weight 183 pounds.   She was readmitted in 4/16 with CHF in the setting of dietary and ?medical noncompliance. She was diuresed with IV Lasix and discharged.   Today she presents for an acute work for increased leg edema and and dyspnea. She has been seen by PCP over the last 2-3 weeks with increased weight gain noted at each visit. She was evaluated in [facility] ED 9/24 with increased dyspnea and leg edema. Discharged on lasix for 7 days with potassium. Says she has had progressive leg edema now extending to thighs and abdomen. Having difficultly walking.+ orthopnea. Drinking lots of fluid. Taking meds except she was not taking carvedilol.    07/25/2015: ECHO EF 25% RV normal 01/30/2015 ECHO EF 20%  11/13/2014: ECHO EF 35-40% RV normal.   Labs 02/05/2015: K 4.7 Creatinine 1.72  Labs 01/28/2015: K 3.4 Creatinine 1.16  Labs 4/16: K 3.3, creatinine 1.72 Labs 6/16: K 2.9, creatinine 0.97, BNP 1536, HCT 42.2 Labs 7/16: K 3.4, creatinine 1.18  ROS: All systems negative except as listed in HPI, PMH and Problem List.  SH:  Social History   Social History  . Marital Status: Widowed    Spouse Name: N/A  . Number of Children: N/A  . Years of Education: N/A   Occupational History  . not employed    Social History Main  Topics  . Smoking status: Current Every Day Smoker -- 0.10 packs/day for .5 years    Types: Cigarettes  . Smokeless tobacco: Current User     Comment: down to 3 cigarettes daily (08/03/14)  . Alcohol Use: No  . Drug Use: No  . Sexual Activity: Not on file   Other Topics Concern  . Not on file   Social History Narrative   Patient has 6 brothers and sisters and none have known coronary artery disease. She lives alone in [place], but has several brothers in the area.    FH:  Family History  Problem Relation Age of Onset  . Diabetes Mother   . Hypertension Mother     Past Medical History  Diagnosis Date  . GERD (gastroesophageal reflux disease)     Probable  . CAD (coronary artery disease) 05/03/14; 05/09/14    a. anterior STEMI with early in-stent thorombosis for missed dose of Brillinta s/p PCI with DESx 2 into LAD (04/2014)  . HTN (hypertension)   . Hyperlipidemia   . Tobacco use   . Non compliance w medication regimen   . Ischemic cardiomyopathy     a. 04/2014 ECHO with EF 45-50% b. Repeat 2D echo 08/14/14 with EF down at 15%. Life vest placed    Current Outpatient Prescriptions  Medication Sig Dispense Refill  . atorvastatin (LIPITOR) 80 MG tablet Take 0.5 tablets (40 mg total) by mouth daily at 6 PM. 30 tablet 6  . carvedilol (COREG) 6.25 MG tablet Take  1 tablet (6.25 mg total) by mouth 2 (two) times daily with a meal. (Patient taking differently: Take 6.25 mg by mouth 2 (two) times daily with a meal. Take as needed) 60 tablet 3  . cetirizine (ZYRTEC) 10 MG tablet Take 10 mg by mouth daily as needed for allergies.    . cholecalciferol (VITAMIN D) 1000 UNITS tablet Take 2,000 Units by mouth daily as needed. Patient states she only takes it when she thinks about it    . famotidine (PEPCID AC) 10 MG chewable tablet Chew 10 mg by mouth 2 (two) times daily as needed for  heartburn.    . furosemide (LASIX) 40 MG tablet Take 1 tablet (40 mg total) by mouth 2 (two) times daily. 30 tablet 0  . hydrochlorothiazide (HYDRODIURIL) 25 MG tablet Take 25 mg by mouth daily.    Marland Kitchen lisinopril (PRINIVIL,ZESTRIL) 5 MG tablet Take 1 tablet (5 mg total) by mouth daily. (Patient not taking: Reported on 07/25/2015) 30 tablet 3  . nitroGLYCERIN (NITROSTAT) 0.4 MG SL tablet Place 0.4 mg under the tongue every 5 (five) minutes as needed for chest pain.    Marland Kitchen ondansetron (ZOFRAN ODT) 4 MG disintegrating tablet Take 1 tablet (4 mg total) by mouth every 8 (eight) hours as needed for nausea or vomiting. 10 tablet 0  . ranitidine (ZANTAC) 150 MG tablet Take 1 tablet (150 mg total) by mouth 2 (two) times daily. 28 tablet 0  . ticagrelor (BRILINTA) 60 MG TABS tablet Take 1 tablet (60 mg total) by mouth 2 (two) times daily. 60 tablet 3  . torsemide (DEMADEX) 20 MG tablet Take 60 mg (3 tabs) in the AM and 40 mg ( 2 tabs) in the PM (Patient taking differently: Take 20 mg by mouth 2 (two) times daily. ) 150 tablet 3   No current facility-administered medications for this encounter.    Filed Vitals:   08/12/15 1440  BP: 144/106  Pulse: 93  Weight: 222 lb (100.699 kg)  SpO2: 97%    PHYSICAL EXAM: General: Well appearing. No resp difficulty. In a wheelchair.  HEENT: normal Neck: supple. JVP to ear. Carotids 2+ bilaterally; no bruits. No lymphadenopathy or thryomegaly appreciated. Cor: PMI normal. Mildly tachycardic, regular rate & rhythm. No rubs, gallops or murmurs. Lungs: R and LLL crackles.  Abdomen: soft, nontender, nondistended. No hepatosplenomegaly. No bruits or masses. Good bowel sounds. Extremities: no cyanosis, clubbing, rash. R and L thigh 3+ edema extending to abdomen.  Neuro: alert & orientedx3, cranial nerves grossly intact. Moves all 4 extremities w/o difficulty. Affect pleasant.  ASSESSMENT & PLAN: 1.Acute/  Chronic systolic HF: Ischemic cardiomyopathy, EF 20% by ECHO 01/30/2015. NYHA III. Marked volume over load. Will need to admit to diurese with IV lasix. 80 mg IV twice a day to start wight.  - Continue lisinopril 5 mg daily.  - Continue Coreg 6.25 mg bid today.  - 2. R pleural effusion: s/p thoracentesis 01/29/15 (transudative). Likely due to CHF.  3. CAD: s/p anterior MI, PCI x2 LAD 04/2014. History of stent thrombosis after missing Brilinta. No recent chest pain. She is allergic to ASA (angioedema-like reaction). Continue brillinta 60 mg twice day - - She is taking atorvastatin. Lipids/LFTs at followup.     Check CXR, CBC, CMET, CEs, TSH.   Admit to 3 east for marked volume overload. Plan to diurese with IV lasix.   CLEGG,AMY NP-C  08/12/2015     Patient seen with NP, agree with the above note. She is markedly volume  overloaded today. I am not sure about her medication compliance.  1. Acute on chronic systolic CHF: EF 69% on recent echo, ischemic cardiomyopathy. Marked volume overload. BP high. - Admit to telemetry.  - Lasix 80 mg IV bid + spironolactone 12.5 daily. - lisinopril 5 mg bid.  - Has not been taking Coreg, hold until more euvolemic.  - Needs cardiac MRI when she can lie flat => could not rule out LV thrombus on echo.  - Needs ECG and CXR.  2. CAD: s/p anterior MI with history of stent thrombosis after missing Brilinta. Continue Brilinta 60 mg bid + statin + ASA 81 daily.   [name] 08/12/2015

## 2015-08-12 NOTE — Telephone Encounter (Signed)
Pt left message , she did not have a ride to come in today she would like DR. Mclean to call he about her swollen condition.. Please advise

## 2015-08-12 NOTE — Telephone Encounter (Signed)
Per the Answering Service from 08-10-15:Pt needs to discuss results of test.

## 2015-08-12 NOTE — Progress Notes (Addendum)
Patient ID: Deborah Ho, female   DOB: [birth year], 64 y.o.   MRN: [id number] PCP: [name] NP-C  Primary HF MD: Dr Shirlee Latch   HPI: [name] is a 64 y.o. female with a history of HTN, HLD, tobacco abuse, CAD s/panterior STEMI with early stent thrombosis for missing doses of Brilinta s/p PCI x 2 into LAD (04/2014) and chronic systolic HF with EF 20% by 3/16 echo.   Admitted to [facility] in 3/16 with CHF and R pleural effusion. Effusion tapped (transudative). Echo with EF down to 20%. Diuresed with IV lasix and transitioned torsemide + metolazone as needed for weight >187 pounds. Creatinine was 1.7 on the day discharge. Discharge weight 183 pounds.   She was readmitted in 4/16 with CHF in the setting of dietary and ?medical noncompliance.  She was diuresed with IV Lasix and discharged.    Today she presents for an acute work for increased leg edema and and dyspnea. She has been seen by PCP over the last 2-3 weeks with increased weight gain noted at each visit. She was evaluated in [facility] ED 9/24 with increased dyspnea and leg edema. Discharged on lasix for 7 days with potassium. Says she has had progressive leg edema now extending to thighs and abdomen. Having difficultly walking.+ orthopnea.  Drinking lots of fluid. Taking meds.    07/25/2015: ECHO EF 25% RV normal 01/30/2015 ECHO EF 20%  11/13/2014: ECHO EF 35-40% RV normal.   Labs 02/05/2015: K 4.7 Creatinine 1.72  Labs 01/28/2015: K 3.4 Creatinine 1.16  Labs 4/16: K 3.3, creatinine 1.72 Labs 6/16: K 2.9, creatinine 0.97, BNP 1536, HCT 42.2 Labs 7/16: K 3.4, creatinine 1.18  ROS: All systems negative except as listed in HPI, PMH and Problem List.  SH:  Social History   Social History  . Marital Status: Widowed    Spouse Name: N/A  . Number of Children: N/A  . Years of Education: N/A   Occupational History  . not employed    Social History Main Topics  . Smoking status: Current Every Day Smoker -- 0.10 packs/day for .5  years    Types: Cigarettes  . Smokeless tobacco: Current User     Comment: down to 3 cigarettes daily (08/03/14)  . Alcohol Use: No  . Drug Use: No  . Sexual Activity: Not on file   Other Topics Concern  . Not on file   Social History Narrative   Patient has 6 brothers and sisters and none have known coronary artery disease. She lives alone in [place], but has several brothers in the area.    FH:  Family History  Problem Relation Age of Onset  . Diabetes Mother   . Hypertension Mother     Past Medical History  Diagnosis Date  . GERD (gastroesophageal reflux disease)     Probable  . CAD (coronary artery disease) 05/03/14; 05/09/14    a. anterior STEMI with early in-stent thorombosis for missed dose of Brillinta s/p PCI with DESx 2 into LAD (04/2014)  . HTN (hypertension)   . Hyperlipidemia   . Tobacco use   . Non compliance w medication regimen   . Ischemic cardiomyopathy     a. 04/2014 ECHO with EF 45-50% b.  Repeat 2D echo 08/14/14 with EF down at 15%. Life vest placed    Current Outpatient Prescriptions  Medication Sig Dispense Refill  . atorvastatin (LIPITOR) 80 MG tablet Take 0.5 tablets (40 mg total) by mouth daily at 6 PM. 30 tablet 6  .  carvedilol (COREG) 6.25 MG tablet Take 1 tablet (6.25 mg total) by mouth 2 (two) times daily with a meal. (Patient taking differently: Take 6.25 mg by mouth 2 (two) times daily with a meal. Take as needed) 60 tablet 3  . cetirizine (ZYRTEC) 10 MG tablet Take 10 mg by mouth daily as needed for allergies.    . cholecalciferol (VITAMIN D) 1000 UNITS tablet Take 2,000 Units by mouth daily as needed. Patient states she only takes it when she thinks about it    . famotidine (PEPCID AC) 10 MG chewable tablet Chew 10 mg by mouth 2 (two) times daily as needed for heartburn.    . furosemide (LASIX) 40 MG tablet Take 1 tablet (40 mg total) by mouth 2 (two) times daily. 30 tablet 0  . hydrochlorothiazide (HYDRODIURIL) 25 MG tablet Take 25 mg by  mouth daily.    Marland Kitchen lisinopril (PRINIVIL,ZESTRIL) 5 MG tablet Take 1 tablet (5 mg total) by mouth daily. (Patient not taking: Reported on 07/25/2015) 30 tablet 3  . nitroGLYCERIN (NITROSTAT) 0.4 MG SL tablet Place 0.4 mg under the tongue every 5 (five) minutes as needed for chest pain.    Marland Kitchen ondansetron (ZOFRAN ODT) 4 MG disintegrating tablet Take 1 tablet (4 mg total) by mouth every 8 (eight) hours as needed for nausea or vomiting. 10 tablet 0  . ranitidine (ZANTAC) 150 MG tablet Take 1 tablet (150 mg total) by mouth 2 (two) times daily. 28 tablet 0  . ticagrelor (BRILINTA) 60 MG TABS tablet Take 1 tablet (60 mg total) by mouth 2 (two) times daily. 60 tablet 3  . torsemide (DEMADEX) 20 MG tablet Take 60 mg (3 tabs) in the AM and 40 mg ( 2 tabs) in the PM (Patient taking differently: Take 20 mg by mouth 2 (two) times daily. ) 150 tablet 3   No current facility-administered medications for this encounter.    Filed Vitals:   08/12/15 1440  BP: 144/106  Pulse: 93  Weight: 222 lb (100.699 kg)  SpO2: 97%    PHYSICAL EXAM: General:  Well appearing. No resp difficulty. In a wheelchair.  HEENT: normal Neck: supple. JVP to ear. Carotids 2+ bilaterally; no bruits. No lymphadenopathy or thryomegaly appreciated. Cor: PMI normal. Mildly tachycardic, regular rate & rhythm. No rubs, gallops or murmurs. Lungs: R and LLL crackles.   Abdomen: soft, nontender, nondistended. No hepatosplenomegaly. No bruits or masses. Good bowel sounds. Extremities: no cyanosis, clubbing, rash.  R and L thigh 3+ edema extending to abdomen.   Neuro: alert & orientedx3, cranial nerves grossly intact. Moves all 4 extremities w/o difficulty. Affect pleasant.  ASSESSMENT & PLAN: 1.Acute/ Chronic systolic HF:  Ischemic cardiomyopathy, EF 20% by ECHO 01/30/2015. NYHA III. Marked volume over load. Will need to admit to diurese with IV lasix. 80 mg IV twice a day to start wight.  - Continue lisinopril 5 mg daily.  2. R pleural  effusion: s/p thoracentesis 01/29/15 (transudative).  Likely due to CHF.  3. CAD: s/p anterior MI,  PCI x2 LAD 04/2014.  History of stent thrombosis after missing Brilinta. No recent chest pain. She is allergic to ASA (angioedema-like reaction).  - She is taking atorvastatin.  Lipids/LFTs at followup.  - Continue Brilinta 60 mg bid.    Check CXR, CBC, CMET, CEs, TSH.   Admit to 3 east for marked volume overload. Plan to diurese with IV lasix.   CLEGG,AMY NP-C  08/12/2015  Patient seen with NP, agree with the above note.  She is markedly volume overloaded today.  I am not sure about her medication compliance.  1. Acute on chronic systolic CHF: EF 41% on recent echo, ischemic cardiomyopathy.  Marked volume overload.  BP high. - Admit to telemetry.  - Lasix 80 mg IV bid + spironolactone 12.5 daily. - lisinopril 5 mg bid.  - Has not been taking Coreg, hold until more euvolemic.  - Needs cardiac MRI when she can lie flat => could not rule out LV thrombus on echo.  - Needs ECG and CXR.  2. CAD: s/p anterior MI with history of stent thrombosis after missing Brilinta.  Continue Brilinta 60 mg bid + statin + ASA 81 daily.   [name] 08/12/2015 3:35 PM

## 2015-08-13 DIAGNOSIS — I5023 Acute on chronic systolic (congestive) heart failure: Secondary | ICD-10-CM

## 2015-08-13 LAB — TROPONIN I: Troponin I: <0.03 ng/mL (ref <0.031)

## 2015-08-13 LAB — BASIC METABOLIC PANEL
Anion gap: 11 (ref 5–15)
BUN: 16 mg/dL (ref 6–20)
CO2: 31 mmol/L (ref 22–32)
Calcium: 9.1 mg/dL (ref 8.9–10.3)
Chloride: 100 mmol/L — ABNORMAL LOW (ref 101–111)
Creatinine, Ser: 1.32 mg/dL — ABNORMAL HIGH (ref 0.44–1.00)
GFR calc Af Amer: 49 mL/min — ABNORMAL LOW (ref >60)
GFR calc non Af Amer: 42 mL/min — ABNORMAL LOW (ref >60)
Glucose, Bld: 91 mg/dL (ref 65–99)
Potassium: 3 mmol/L — ABNORMAL LOW (ref 3.5–5.1)
Sodium: 142 mmol/L (ref 135–145)

## 2015-08-13 MED ORDER — POTASSIUM CHLORIDE CRYS ER 20 MEQ PO TBCR
40.0000 meq | EXTENDED_RELEASE_TABLET | Freq: Once | ORAL | Status: AC
  Administered 2015-08-13: 40 meq via ORAL
  Filled 2015-08-13: qty 2

## 2015-08-13 NOTE — Progress Notes (Signed)
Patient refused Brilinta tonight and attempted to refuse Lisinopril as well.  Educated patient on the importance of each, patient agreed to lisinopril but continued to refuse Brilinta.  Will continue to monitor. Troy Sine

## 2015-08-13 NOTE — Evaluation (Signed)
Physical Therapy Evaluation Patient Details Name: Deborah Ho MRN: [id number] DOB: [birth year] Today's Date: 08/13/2015   History of Present Illness  Patient lives alone, no longer drives, she use the bus or a cab for transportation. Her brothers lives close by and they assist her when needed. PCP is Dr [name], has private insurance with [employer]/ prescription drug coverage. Pharmacy of choice is [facility] and states that now she does not have any problem getting her medication. ( her [employer] and family was helping her with the co pays). She has scales at home and weighs herself daily. Patient could benefit from a Disease Management Program for CHF. HHC choice offered, patient chose [facility], Lupita Leash with [facility] called for arrangements. Patient stated that she doesn't need a walker or a cane at this time. Lots of education needed with CHF. Attending MD at discharge please enter the face to face document required for [facility] therapy.  Clinical Impression  Pt admitted with above diagnosis. Pt currently with functional limitations due to the deficits listed below (see PT Problem List). Pt was able to ambulate without assistive device with fair balance.  No significant LOB with gait today but no challenges given.  Should not need device as LEs swollen and are getting better with medications.  HHPT recommended. Will follow acutely.   Pt will benefit from skilled PT to increase their independence and safety with mobility to allow discharge to the venue listed below.      Follow Up Recommendations Home health PT;Supervision - Intermittent    Equipment Recommendations  None recommended by PT    Recommendations for Other Services       Precautions / Restrictions Precautions Precautions: None Restrictions Weight Bearing Restrictions: No      Mobility  Bed Mobility Overal bed mobility: Independent                Transfers Overall transfer level: Independent                  Ambulation/Gait Ambulation/Gait assistance: Supervision Ambulation Distance (Feet): 120 Feet Assistive device: None Gait Pattern/deviations: Step-through pattern;Decreased stride length   Gait velocity interpretation: Below normal speed for age/gender General Gait Details: Pt ambulated without device slow and steady.  did not challenge balance as pt was ambulating slowly and carefully as her LEs are swollen.    Stairs            Wheelchair Mobility    Modified Rankin (Stroke Patients Only)       Balance Overall balance assessment: Needs assistance         Standing balance support: No upper extremity supported;During functional activity Standing balance-Leahy Scale: Fair Standing balance comment: Did not challenge balance more than minimal perturbations.                               Pertinent Vitals/Pain Pain Assessment: No/denies pain  VSS    Home Living Family/patient expects to be discharged to:: Private residence Living Arrangements: Alone Available Help at Discharge: Family;Friend(s);Available PRN/intermittently Type of Home: Apartment Home Access: Level entry;Elevator     Home Layout: One level Home Equipment: Grab bars - toilet;Grab bars - tub/shower      Prior Function Level of Independence: Independent               Hand Dominance   Dominant Hand: Right    Extremity/Trunk Assessment   Upper Extremity Assessment: Defer to OT  evaluation           Lower Extremity Assessment: Generalized weakness      Cervical / Trunk Assessment: Normal  Communication   Communication: No difficulties  Cognition Arousal/Alertness: Awake/alert Behavior During Therapy: WFL for tasks assessed/performed Overall Cognitive Status: Within Functional Limits for tasks assessed                      General Comments General comments (skin integrity, edema, etc.): Bil LE swollen    Exercises        Assessment/Plan    PT  Assessment Patient needs continued PT services  PT Diagnosis Generalized weakness   PT Problem List Decreased activity tolerance;Decreased balance;Decreased mobility;Decreased safety awareness;Decreased knowledge of precautions  PT Treatment Interventions DME instruction;Gait training;Functional mobility training;Therapeutic activities;Therapeutic exercise;Balance training;Patient/family education   PT Goals (Current goals can be found in the Care Plan section) Acute Rehab PT Goals Patient Stated Goal: to go  home PT Goal Formulation: With patient Time For Goal Achievement: 08/20/15 Potential to Achieve Goals: Good    Frequency Min 2X/week   Barriers to discharge        Co-evaluation               End of Session Equipment Utilized During Treatment: Gait belt Activity Tolerance: Patient limited by fatigue Patient left: in bed;with call bell/phone within reach Nurse Communication: Mobility status         Time: 9449-6759 PT Time Calculation (min) (ACUTE ONLY): 13 min   Charges:   PT Evaluation $Initial PT Evaluation Tier I: 1 Procedure     PT G CodesBerline Lopes 09/01/2015, 3:30 PM Oseph Imburgia [facility] Acute Rehabilitation [phone] (pager)

## 2015-08-13 NOTE — Progress Notes (Signed)
Wrong patient output charted

## 2015-08-13 NOTE — Progress Notes (Signed)
Wrong patient vital charted

## 2015-08-13 NOTE — Progress Notes (Signed)
Advanced Heart Failure Rounding Note  PCP: [name] NP-C Primary Cardiologist: Dr Shirlee Latch  Subjective:    Refused Brilinta last night.  Went over importance with hx of stent thrombus.  She said she is willing to take if that's what she is supposed to do.  She was under the impression it was x 1 year and then stop.   Feels ok today.  Legs slightly sore but breathing improved.    Out 2.5 L and down 4 lbs on 80 mg IV lasix BID. Cr stable.   Objective:   Weight Range: 215 lb (97.523 kg) Body mass index is 35.78 kg/(m^2).   Vital Signs:   Temp:  [97.4 F (36.3 C)-98 F (36.7 C)] 98 F (36.7 C) (09/27 0728) Pulse Rate:  [87-99] 87 (09/27 0728) Resp:  [18-20] 18 (09/27 0728) BP: (129-162)/(76-109) 129/76 mmHg (09/27 0728) SpO2:  [96 %-100 %] 96 % (09/27 0728) Weight:  [215 lb (97.523 kg)-219 lb 6.4 oz (99.519 kg)] 215 lb (97.523 kg) (09/27 0513) Last BM Date: 08/12/15  Weight change: Filed Weights   08/12/15 1620 08/13/15 0513  Weight: 219 lb 6.4 oz (99.519 kg) 215 lb (97.523 kg)    Intake/Output:   Intake/Output Summary (Last 24 hours) at 08/13/15 0807 Last data filed at 08/13/15 0500  Gross per 24 hour  Intake    360 ml  Output   2950 ml  Net  -2590 ml     Physical Exam: General:  Well appearing. NAD HEENT: normal Neck: supple. JVP to ear. Carotids 2+ bilat; no bruits. No lymphadenopathy or thyromegaly appreciated. Cor: PMI nondisplaced. Regular rate & rhythm. No rubs, gallops or murmurs appreciated Lungs: Basilar crackles Abdomen: soft, nontender, nondistended. No hepatosplenomegaly. No bruits or masses. +BS Extremities: no cyanosis, clubbing or rash. 2-3+ LE edema to thighs  Neuro: alert & orientedx3, cranial nerves grossly intact. moves all 4 extremities w/o difficulty. Affect pleasant   Telemetry: NSR 80s, lots of artifact labeled as alarms.  Labs: CBC  Recent Labs  08/10/15 1603 08/12/15 1703  WBC 8.4 8.7  NEUTROABS 5.7 5.9  HGB 13.7 14.1   HCT 43.9 45.4  MCV 84.6 84.9  PLT 317 353   Basic Metabolic Panel  Recent Labs  08/12/15 1703 08/13/15 0344  NA 140 142  K 3.3* 3.0*  CL 99* 100*  CO2 31 31  GLUCOSE 98 91  BUN 19 16  CALCIUM 9.0 9.1  MG 1.9  --    Liver Function Tests  Recent Labs  08/10/15 1603 08/12/15 1703  AST 26 31  ALT 15 17  ALKPHOS 190* 210*  BILITOT 1.5* 1.8*  PROT 6.3* 6.4*  ALBUMIN 3.2* 3.4*   No results for input(s): LIPASE, AMYLASE in the last 72 hours. Cardiac Enzymes  Recent Labs  08/12/15 1703 08/12/15 2203 08/13/15 0344  TROPONINI <0.03 <0.03 <0.03    BNP: BNP (last 3 results)  Recent Labs  05/01/15 1505 08/10/15 1603 08/12/15 1703  BNP 1535.7* 2104.4* 1799.8*    ProBNP (last 3 results)  Recent Labs  08/22/14 1226 09/13/14 1854 11/05/14 1143  PROBNP 3059.0* 12687.0* 321.0*     D-Dimer No results for input(s): DDIMER in the last 72 hours. Hemoglobin A1C No results for input(s): HGBA1C in the last 72 hours. Fasting Lipid Panel No results for input(s): CHOL, HDL, LDLCALC, TRIG, CHOLHDL, LDLDIRECT in the last 72 hours. Thyroid Function Tests  Recent Labs  08/12/15 1705  TSH 1.481    Other results:  Imaging/Studies:  Dg Chest 2 View  08/12/2015   CLINICAL DATA:  Shortness of breath for the past 3 days.  EXAM: CHEST  2 VIEW  COMPARISON:  08/10/2015.  FINDINGS: Stable enlarged cardiac silhouette and prominent interstitial markings. Increased prominence of the pulmonary vasculature. Small right pleural effusion, mildly increased. Mild thoracic spine degenerative changes.  IMPRESSION: 1. Mild increase in pulmonary vascular congestion and small right pleural effusion. 2. Stable cardiomegaly and mild interstitial pulmonary edema.   Electronically Signed   By: Beckie Salts M.D.   On: 08/12/2015 16:13     Latest Echo  Latest Cath   Medications:     Scheduled Medications: . atorvastatin  40 mg Oral q1800  . enoxaparin (LOVENOX) injection  40  mg Subcutaneous Q24H  . furosemide  80 mg Intravenous BID  . Influenza vac split quadrivalent PF  0.5 mL Intramuscular Tomorrow-1000  . lisinopril  5 mg Oral BID  . potassium chloride SA  20 mEq Oral BID  . sodium chloride  3 mL Intravenous Q12H  . spironolactone  25 mg Oral Daily  . ticagrelor  60 mg Oral BID     Infusions:     PRN Medications:  sodium chloride, acetaminophen, chlorpheniramine-HYDROcodone, loratadine, ondansetron (ZOFRAN) IV, sodium chloride   Assessment/Plan   1.Acute/ Chronic systolic HF: Ischemic cardiomyopathy, EF 20% by ECHO 01/30/2015. NYHA III.  - Remains volume overloaded on exam. Weight 215 today, was 190 in June/July. - Continue lasix 80 mg IV BID and spiro 12.5 daily - Continue lisinopril 5 mg BID.  - Hold Coreg until fluid status improves - Cardiac MRI once no longer orthopneic.(could not rule out LV thrombus on echo. 2. R pleural effusion: s/p thoracentesis 01/29/15 (transudative). Likely due to CHF.  3. CAD: s/p anterior MI, PCI x2 LAD 04/2014. History of stent thrombosis after missing Brilinta. No recent chest pain. - - She is allergic to ASA (angioedema-like reaction).  - Continue brillinta 60 mg twice day - She is taking atorvastatin. Lipids/LFTs at followup.  4. Hypokalemia - Will increase supp for today and follow closely.  - Give 40 meq now, along with 20 meq BID today.  Length of Stay: 1  Graciella Freer PA-C 08/13/2015, 8:07 AM  Advanced Heart Failure Team Pager [phone] (M-F; 7a - 4p)  Please contact [facility] for night-coverage after hours (4p -7a ) and weekends on [email or web address]  1. Acute on chronic systolic CHF: EF 44% on recent echo, ischemic cardiomyopathy. Marked volume overload. She diuresed well yesterday, weight down 4 lbs.  Still volume overloaded.  - Lasix 80 mg IV bid + spironolactone (increase to 25 mg daily).  - Continue lisinopril 5 mg bid.  - Restart Coreg when more euvolemic.  - Replace K.  -  Needs cardiac MRI when she can lie flat (probably tomorrow) => could not rule out LV thrombus on echo.  2. CAD: s/p anterior MI with history of stent thrombosis after missing Brilinta. Continue Brilinta 60 mg bid + statin.  Angioedema with aspirin.   [name] 08/13/2015 1:29 PM

## 2015-08-13 NOTE — Care Management Note (Signed)
Case Management Note  Patient Details  Name: Deborah Ho MRN: [id number] Date of Birth: [birth year]  Subjective/Objective:         Admitted with CHF           Action/Plan: Patient lives alone, no longer drives, she use the bus or a cab for transportation. Her brothers lives close by and they assist her when needed. PCP is Dr [name], has private insurance with [employer]/ prescription drug coverage. Pharmacy of choice is [facility] and states that now she does not have any problem getting her medication. ( her Church and family was helping her with the co pays). She has scales at home and weighs herself daily. Patient could benefit from a Disease Management Program for CHF. HHC choice offered, patient chose [facility], Lupita Leash with [facility] called for arrangements. Patient stated that she doesn't need a walker or a cane at this time. Lots of education needed with CHF. Attending MD at discharge please enter the face to face document required for [facility] therapy.  Expected Discharge Date:  Possibly 08/15/2015               Expected Discharge Plan:  Home w Home Health Services  Discharge planning Services  CM Consult     Choice offered to:  Patient  HH Arranged:  Disease Management, RN, PT [facility] Agency:  [facility]  Status of Service:  In process, will continue to follow  [name] [phone] 08/13/2015, 10:09 AM

## 2015-08-14 ENCOUNTER — Inpatient Hospital Stay (HOSPITAL_COMMUNITY): Payer: No Typology Code available for payment source

## 2015-08-14 LAB — BASIC METABOLIC PANEL
Anion gap: 12 (ref 5–15)
BUN: 20 mg/dL (ref 6–20)
CO2: 29 mmol/L (ref 22–32)
Calcium: 9.1 mg/dL (ref 8.9–10.3)
Chloride: 98 mmol/L — ABNORMAL LOW (ref 101–111)
Creatinine, Ser: 1.33 mg/dL — ABNORMAL HIGH (ref 0.44–1.00)
GFR calc Af Amer: 48 mL/min — ABNORMAL LOW (ref >60)
GFR calc non Af Amer: 42 mL/min — ABNORMAL LOW (ref >60)
Glucose, Bld: 95 mg/dL (ref 65–99)
Potassium: 3.3 mmol/L — ABNORMAL LOW (ref 3.5–5.1)
Sodium: 139 mmol/L (ref 135–145)

## 2015-08-14 MED ORDER — GADOBENATE DIMEGLUMINE 529 MG/ML IV SOLN
30.0000 mL | Freq: Once | INTRAVENOUS | Status: AC
  Administered 2015-08-14: 30 mL via INTRAVENOUS

## 2015-08-14 MED ORDER — POTASSIUM CHLORIDE CRYS ER 20 MEQ PO TBCR
40.0000 meq | EXTENDED_RELEASE_TABLET | Freq: Once | ORAL | Status: AC
  Administered 2015-08-14: 40 meq via ORAL
  Filled 2015-08-14: qty 2

## 2015-08-14 MED ORDER — FUROSEMIDE 10 MG/ML IJ SOLN
80.0000 mg | Freq: Three times a day (TID) | INTRAMUSCULAR | Status: DC
  Administered 2015-08-14 – 2015-08-16 (×7): 80 mg via INTRAVENOUS
  Filled 2015-08-14 (×7): qty 8

## 2015-08-14 MED ORDER — POTASSIUM CHLORIDE CRYS ER 20 MEQ PO TBCR
40.0000 meq | EXTENDED_RELEASE_TABLET | Freq: Two times a day (BID) | ORAL | Status: DC
  Administered 2015-08-14 – 2015-08-15 (×4): 40 meq via ORAL
  Filled 2015-08-14 (×5): qty 2

## 2015-08-14 MED ORDER — MAGNESIUM SULFATE IN D5W 10-5 MG/ML-% IV SOLN
1.0000 g | Freq: Once | INTRAVENOUS | Status: AC
  Administered 2015-08-14: 1 g via INTRAVENOUS
  Filled 2015-08-14: qty 100

## 2015-08-14 MED ORDER — LISINOPRIL 5 MG PO TABS
7.5000 mg | ORAL_TABLET | Freq: Two times a day (BID) | ORAL | Status: DC
  Administered 2015-08-14 – 2015-08-16 (×5): 7.5 mg via ORAL
  Filled 2015-08-14 (×10): qty 1

## 2015-08-14 NOTE — Progress Notes (Signed)
Pt educated r/t TED hose, and pt refuses to wear them.

## 2015-08-14 NOTE — Progress Notes (Signed)
Occupational Therapy Evaluation Patient Details Name: Deborah Ho MRN: [id number] DOB: January 22, 1951 Today's Date: 08/14/2015    History of Present Illness 64 y.o. female with a history of HTN, HLD, tobacco abuse, CAD s/panterior STEMI with early stent thrombosis for missing doses of Brilinta s/p PCI x 2 into LAD (04/2014) and chronic systolic HF.Admitted with BLE edema ad dyspnea.   Clinical Impression   PTA,ptindependent with ADL and mobility. Pt close to baseline for basic ADL tasks, however,fatigues. Pt has difficulty completing IADL at home. Feel pt would benefit from [facility] to assist with IADL tasks and further educate on energy conservation at  Home. Will defer further OT to [facility].OT signing off.     Follow Up Recommendations  Home health OT    Equipment Recommendations  Other (comment) (shower seat. )    Recommendations for Other Services       Precautions / Restrictions Precautions Precautions: None Restrictions Weight Bearing Restrictions: No      Mobility Bed Mobility Overal bed mobility: Independent                Transfers Overall transfer level: Independent                    Balance             Standing balance-Leahy Scale: Fair                              ADL Overall ADL's : At baseline                                       General ADL Comments: Pt able to complete simple ADL tasks,however,fatigues easier. Educated ptonenergy conservation for ADL.Pt discussing how IADL tasks are difficult for her at home. Encouraged pt to wear her TED hose. She has the understanding that she is only to wear them during the night     Vision     Perception     Praxis      Pertinent Vitals/Pain  no c/o pain     Hand Dominance Right   Extremity/Trunk Assessment Upper Extremity Assessment Upper Extremity Assessment: Generalized weakness   Lower Extremity Assessment Lower Extremity Assessment:  (increased  edema)   Cervical / Trunk Assessment Cervical / Trunk Assessment: Normal   Communication Communication Communication: No difficulties   Cognition Arousal/Alertness: Awake/alert Behavior During Therapy: WFL for tasks assessed/performed Overall Cognitive Status: Within Functional Limits for tasks assessed                     General Comments       Exercises       Shoulder Instructions      Home Living Family/patient expects to be discharged to:: Private residence Living Arrangements: Alone Available Help at Discharge: Family;Friend(s);Available PRN/intermittently Type of Home: Apartment Home Access: Level entry;Elevator     Home Layout: One level     Bathroom Shower/Tub: Chief Strategy Officer: Handicapped height Bathroom Accessibility: Yes How Accessible: Accessible via walker Home Equipment: Grab bars - toilet;Grab bars - tub/shower;Other (comment) (pull cord in bathroom)          Prior Functioning/Environment Level of Independence: Independent             OT Diagnosis: Generalized weakness   OT Problem List: Decreased activity tolerance;Decreased knowledge of  use of DME or AE;Cardiopulmonary status limiting activity;Obesity;Increased edema   OT Treatment/Interventions:      OT Goals(Current goals can be found in the care plan section) Acute Rehab OT Goals Patient Stated Goal: to go  home OT Goal Formulation: All assessment and education complete, DC therapy  OT Frequency:     Barriers to D/C:            Co-evaluation              End of Session Nurse Communication: Mobility status  Activity Tolerance: Patient tolerated treatment well Patient left: in chair;with call bell/phone within reach   Time: 1510-1538 OT Time Calculation (min): 28 min Charges:  OT General Charges $OT Visit: 1 Procedure OT Evaluation $Initial OT Evaluation Tier I: 1 Procedure OT Treatments $Self Care/Home Management : 8-22 mins G-Codes:     Serjio Deupree,HILLARY Aug 18, 2015, 3:45 PM   [facility], OTR/L  [phone] 08-18-2015

## 2015-08-14 NOTE — Progress Notes (Signed)
Patient ID: Deborah Ho, female   DOB: [birth year], 64 y.o.   MRN: [id number]  Advanced Heart Failure Rounding Note  PCP: [name] NP-C Primary Cardiologist: Dr Shirlee Latch  Subjective:    No complaints today.  Diuresed reasonably well yesterday, 3 lbs down.  Creatinine stable.    Objective:   Weight Range: 212 lb 11.2 oz (96.48 kg) Body mass index is 35.4 kg/(m^2).   Vital Signs:   Temp:  [97.9 F (36.6 C)-99.5 F (37.5 C)] 97.9 F (36.6 C) (09/28 0426) Pulse Rate:  [82-88] 88 (09/28 0426) Resp:  [16-18] 18 (09/28 0426) BP: (113-130)/(68-91) 130/91 mmHg (09/28 0426) SpO2:  [99 %-100 %] 99 % (09/28 0426) Weight:  [212 lb 11.2 oz (96.48 kg)] 212 lb 11.2 oz (96.48 kg) (09/28 0426) Last BM Date: 08/13/15  Weight change: Filed Weights   08/12/15 1620 08/13/15 0513 08/14/15 0426  Weight: 219 lb 6.4 oz (99.519 kg) 215 lb (97.523 kg) 212 lb 11.2 oz (96.48 kg)    Intake/Output:   Intake/Output Summary (Last 24 hours) at 08/14/15 0838 Last data filed at 08/14/15 0600  Gross per 24 hour  Intake    840 ml  Output   3075 ml  Net  -2235 ml     Physical Exam: General:  Well appearing. NAD HEENT: normal Neck: supple. JVP to ear. Carotids 2+ bilat; no bruits. No lymphadenopathy or thyromegaly appreciated. Cor: PMI nondisplaced. Regular rate & rhythm. No rubs, gallops or murmurs appreciated Lungs: Basilar crackles Abdomen: soft, nontender, nondistended. No hepatosplenomegaly. No bruits or masses. +BS Extremities: no cyanosis, clubbing or rash. 2-3+ LE edema to thighs  Neuro: alert & orientedx3, cranial nerves grossly intact. moves all 4 extremities w/o difficulty. Affect pleasant  Telemetry: NSR 80s  Labs: CBC  Recent Labs  08/12/15 1703  WBC 8.7  NEUTROABS 5.9  HGB 14.1  HCT 45.4  MCV 84.9  PLT 353   Basic Metabolic Panel  Recent Labs  08/12/15 1703 08/13/15 0344 08/14/15 0345  NA 140 142 139  K 3.3* 3.0* 3.3*  CL 99* 100* 98*  CO2 31 31 29   GLUCOSE  98 91 95  BUN 19 16 20   CALCIUM 9.0 9.1 9.1  MG 1.9  --   --    Liver Function Tests  Recent Labs  08/12/15 1703  AST 31  ALT 17  ALKPHOS 210*  BILITOT 1.8*  PROT 6.4*  ALBUMIN 3.4*   No results for input(s): LIPASE, AMYLASE in the last 72 hours. Cardiac Enzymes  Recent Labs  08/12/15 1703 08/12/15 2203 08/13/15 0344  TROPONINI <0.03 <0.03 <0.03    BNP: BNP (last 3 results)  Recent Labs  05/01/15 1505 08/10/15 1603 08/12/15 1703  BNP 1535.7* 2104.4* 1799.8*    ProBNP (last 3 results)  Recent Labs  08/22/14 1226 09/13/14 1854 11/05/14 1143  PROBNP 3059.0* 12687.0* 321.0*     D-Dimer No results for input(s): DDIMER in the last 72 hours. Hemoglobin A1C No results for input(s): HGBA1C in the last 72 hours. Fasting Lipid Panel No results for input(s): CHOL, HDL, LDLCALC, TRIG, CHOLHDL, LDLDIRECT in the last 72 hours. Thyroid Function Tests  Recent Labs  08/12/15 1705  TSH 1.481    Other results:     Imaging/Studies:  Dg Chest 2 View  08/12/2015   CLINICAL DATA:  Shortness of breath for the past 3 days.  EXAM: CHEST  2 VIEW  COMPARISON:  08/10/2015.  FINDINGS: Stable enlarged cardiac silhouette and prominent interstitial markings. Increased prominence of  the pulmonary vasculature. Small right pleural effusion, mildly increased. Mild thoracic spine degenerative changes.  IMPRESSION: 1. Mild increase in pulmonary vascular congestion and small right pleural effusion. 2. Stable cardiomegaly and mild interstitial pulmonary edema.   Electronically Signed   By: Beckie Salts M.D.   On: 08/12/2015 16:13    Latest Echo  Latest Cath   Medications:     Scheduled Medications: . atorvastatin  40 mg Oral q1800  . enoxaparin (LOVENOX) injection  40 mg Subcutaneous Q24H  . furosemide  80 mg Intravenous 3 times per day  . lisinopril  7.5 mg Oral BID  . magnesium sulfate 1 - 4 g bolus IVPB  1 g Intravenous Once  . potassium chloride SA  40 mEq Oral BID   . potassium chloride  40 mEq Oral Once  . potassium chloride  40 mEq Oral Once  . sodium chloride  3 mL Intravenous Q12H  . spironolactone  25 mg Oral Daily  . ticagrelor  60 mg Oral BID    Infusions:    PRN Medications: sodium chloride, acetaminophen, chlorpheniramine-HYDROcodone, loratadine, ondansetron (ZOFRAN) IV, sodium chloride   Assessment/Plan   1. Acute on chronic systolic CHF: EF 31% on recent echo, ischemic cardiomyopathy. Marked volume overload. She diuresed reasonably again yesterday, weight down 3 lbs.  Still markedly volume overloaded.  - Increase Lasix to 80 mg IV every 8 hours, continue spironolactone 25 daily.  - Increase lisinopril to 7.5 mg bid.  - Restart Coreg when more euvolemic.  - Replace K and Mg.  - Cardiac MRI today => could not rule out LV thrombus on echo.  2. CAD: s/p anterior MI with history of stent thrombosis after missing Brilinta. Continue Brilinta 60 mg bid + statin.  Angioedema with aspirin.  3. CKD: Stable creatinine 1.3.   [name] 08/14/2015 8:38 AM

## 2015-08-15 LAB — BASIC METABOLIC PANEL
Anion gap: 11 (ref 5–15)
BUN: 20 mg/dL (ref 6–20)
CO2: 29 mmol/L (ref 22–32)
Calcium: 9.4 mg/dL (ref 8.9–10.3)
Chloride: 100 mmol/L — ABNORMAL LOW (ref 101–111)
Creatinine, Ser: 1.32 mg/dL — ABNORMAL HIGH (ref 0.44–1.00)
GFR calc Af Amer: 49 mL/min — ABNORMAL LOW (ref >60)
GFR calc non Af Amer: 42 mL/min — ABNORMAL LOW (ref >60)
Glucose, Bld: 120 mg/dL — ABNORMAL HIGH (ref 65–99)
Potassium: 4.2 mmol/L (ref 3.5–5.1)
Sodium: 140 mmol/L (ref 135–145)

## 2015-08-15 LAB — MAGNESIUM: Magnesium: 2 mg/dL (ref 1.7–2.4)

## 2015-08-15 NOTE — Progress Notes (Signed)
Patient ID: Deborah Ho, female   DOB: 25-Sep-1951, 64 y.o.   MRN: [id number]  Advanced Heart Failure Rounding Note  PCP: [name] NP-C Primary Cardiologist: Dr Shirlee Latch  Subjective:    Abdomen feels much better. Walking halls without difficulty.  No SOB.  Says she was a little dizzy with SBP of 113 last night, and wants [place] to be careful increasing her medications.  Out 3 L and down 3 lbs on 80 mg IV lasix TID.  Creatine and electrolytes stable.   Objective:   Weight Range: 209 lb 9.6 oz (95.074 kg) Body mass index is 34.88 kg/(m^2).   Vital Signs:   Temp:  [97.7 F (36.5 C)-98.7 F (37.1 C)] 97.7 F (36.5 C) (09/29 0521) Pulse Rate:  [86-89] 88 (09/29 0521) Resp:  [16-20] 16 (09/29 0521) BP: (100-124)/(72-75) 124/75 mmHg (09/29 0521) SpO2:  [98 %-99 %] 98 % (09/29 0521) Weight:  [209 lb 9.6 oz (95.074 kg)] 209 lb 9.6 oz (95.074 kg) (09/29 0521) Last BM Date: 08/13/15  Weight change: Filed Weights   08/13/15 0513 08/14/15 0426 08/15/15 0521  Weight: 215 lb (97.523 kg) 212 lb 11.2 oz (96.48 kg) 209 lb 9.6 oz (95.074 kg)    Intake/Output:   Intake/Output Summary (Last 24 hours) at 08/15/15 0725 Last data filed at 08/15/15 8891  Gross per 24 hour  Intake   1102 ml  Output   4200 ml  Net  -3098 ml     Physical Exam: General:  Well appearing. NAD HEENT: normal Neck: supple. JVP to ear. Carotids 2+ bilat; no bruits. No lymphadenopathy or thyromegaly. Cor: PMI nondisplaced. Regular rate & rhythm. No rubs, gallops or murmurs  Lungs: Bibasilar crackles Abdomen: soft, NT, ND. No HSM. No bruits or masses. +BS Extremities: no cyanosis, clubbing or rash. 2-3+ LE edema to thighs  Neuro: alert & orientedx3, cranial nerves grossly intact. moves all 4 extremities w/o difficulty. Affect pleasant  Telemetry: NSR 80s  Labs: CBC  Recent Labs  08/12/15 1703  WBC 8.7  NEUTROABS 5.9  HGB 14.1  HCT 45.4  MCV 84.9  PLT 353   Basic Metabolic Panel  Recent Labs  08/12/15 1703  08/14/15 0345 08/15/15 0309  NA 140  < > 139 140  K 3.3*  < > 3.3* 4.2  CL 99*  < > 98* 100*  CO2 31  < > 29 29  GLUCOSE 98  < > 95 120*  BUN 19  < > 20 20  CALCIUM 9.0  < > 9.1 9.4  MG 1.9  --   --  2.0  < > = values in this interval not displayed. Liver Function Tests  Recent Labs  08/12/15 1703  AST 31  ALT 17  ALKPHOS 210*  BILITOT 1.8*  PROT 6.4*  ALBUMIN 3.4*   No results for input(s): LIPASE, AMYLASE in the last 72 hours. Cardiac Enzymes  Recent Labs  08/12/15 1703 08/12/15 2203 08/13/15 0344  TROPONINI <0.03 <0.03 <0.03    BNP: BNP (last 3 results)  Recent Labs  05/01/15 1505 08/10/15 1603 08/12/15 1703  BNP 1535.7* 2104.4* 1799.8*    ProBNP (last 3 results)  Recent Labs  08/22/14 1226 09/13/14 1854 11/05/14 1143  PROBNP 3059.0* 12687.0* 321.0*     D-Dimer No results for input(s): DDIMER in the last 72 hours. Hemoglobin A1C No results for input(s): HGBA1C in the last 72 hours. Fasting Lipid Panel No results for input(s): CHOL, HDL, LDLCALC, TRIG, CHOLHDL, LDLDIRECT in the last 72 hours.  Thyroid Function Tests  Recent Labs  08/12/15 1705  TSH 1.481    Other results:     Imaging/Studies:  Mr Card Morphology Wo/w Cm  08/14/2015   CLINICAL DATA:  Cardiomyopathy,?LV thrombus on echo.  EXAM: CARDIAC MRI  TECHNIQUE: The patient was scanned on a 1.5 Tesla GE magnet. A dedicated cardiac coil was used. Functional imaging was done using Fiesta sequences. 2,3, and 4 chamber views were done to assess for RWMA's. Modified Simpson's rule using a short axis stack was used to calculate an ejection fraction on a dedicated work [employer]. The patient received 29 cc of Multihance. After 10 minutes inversion recovery sequences were used to assess for infiltration and scar tissue.  CONTRAST:  29 cc Multihance  FINDINGS: There is a small to moderate right pleural effusion. Limited images of lung fields showed no other  abnormalities.  The left ventricle was mildly dilated. There was septal dyskinesis, anterior akinesis, and apical akinesis. There was no LV thrombus noted. EF 19%. Mildly dilated RV with mildly decreased systolic function. Moderate biatrial enlargement. Moderate TR, moderate MR. Trileaflet aortic valve without significant regurgitation or stenosis.  On delayed enhancement imaging, there was full thickness late gadolinium enhancement (LGE) in the mid to apical anteroseptal and inferoseptal walls, at the apex, and in the apical lateral wall. The mid-apical anterior wall had 76-99% wall thickness subendocardial LGE.  MEASUREMENTS: MEASUREMENTS LV EDV 215 mL  LV SV 41 mL  LV EF 19%  IMPRESSION: 1. Mildly dilated LV with wall motion abnormalities as noted above. EF 19%.  2.  Mildly dilated RV with mildly decreased systolic function.  3.  No LV thrombus.  4. LGE shows scar throughout the mid to apical septal wall, the apical lateral wall, the apex, and the anterior wall. These walls appear nonviable and are unlikely to improve with revascularization.  Jakari Jacot   Electronically Signed   By: [name] M.D.   On: 08/14/2015 17:07    Latest Echo  Latest Cath   Medications:     Scheduled Medications: . atorvastatin  40 mg Oral q1800  . enoxaparin (LOVENOX) injection  40 mg Subcutaneous Q24H  . furosemide  80 mg Intravenous 3 times per day  . lisinopril  7.5 mg Oral BID  . potassium chloride SA  40 mEq Oral BID  . sodium chloride  3 mL Intravenous Q12H  . spironolactone  25 mg Oral Daily  . ticagrelor  60 mg Oral BID    Infusions:    PRN Medications: sodium chloride, acetaminophen, chlorpheniramine-HYDROcodone, loratadine, ondansetron (ZOFRAN) IV, sodium chloride   Assessment/Plan   1. Acute on chronic systolic CHF: EF 60% on cMRI 08/14/15, ischemic cardiomyopathy.  She diuresed better yesterday, weight down another 3 lbs.  Remains markedly volume overloaded. Baseline weight in 190s. -  Continue Lasix 80 mg IV every 8 hours, continue spironolactone 25 daily.  - Continue lisinopril 7.5 mg bid.  - Restart Coreg when more euvolemic.  - Replace K and Mg.  - Cardiac MRI 08/14/15 - EF 19%. RV mildly decreases systolic function. NO LV Thrombus.  Scarring with non viable apical septal, apical lateral, apex, and anterior wall. Unlikely to improve with revascularization.  - Does not have great insight into her disease process, but seems to understand better after long conversation with HF team and pharmacy this morning. Willing to tolerate med titration, but is relatively sensitive to decreases in her SBP.  - Intolerant of TED hose 2. CAD: s/p  anterior MI with history of stent thrombosis after missing Brilinta. Continue Brilinta 60 mg bid + statin.  Angioedema with aspirin.  3. CKD: Stable creatinine 1.3.   Graciella Freer, PA-C 08/15/2015 7:25 AM  Patient seen with PA, agree with the above note.  She continues to diurese but still very volume overloaded.  Continue IV Lasix.  Continue lisinopril and spironolactone at current doses, eventually back on low dose Coreg.  No thrombus on MRI.   [name] 08/15/2015 11:37 AM

## 2015-08-15 NOTE — Progress Notes (Signed)
UR completed 

## 2015-08-15 NOTE — Progress Notes (Signed)
Physical Therapy Treatment and Discharge Patient Details Name: Deborah Ho MRN: [id number] DOB: [birth year] Today's Date: 08/15/2015    History of Present Illness 64 y.o. female with a history of HTN, HLD, tobacco abuse, CAD s/panterior STEMI with early stent thrombosis for missing doses of Brilinta s/p PCI x 2 into LAD (04/2014) and chronic systolic HF     PT Comments    Patient in bed, eager to participate in PT today. She reports that she is an avid walker and has been taking many measures to get her condition under control, such as altering her diet. She reports feeling significantly better due to decreased LE swelling, and was able to ambulate and transfer as described below. She had a lot of questions regarding exercise and her swelling management, expressed concerns about hospital and at-home medications not being the same and expressed not wanting to come back for another episode of increased LE swelling. She was educated on the importance of safe exercise and ways to reduce swelling, such as compliance with medications and use of TED hose. She is currently independent with all activity. All education is complete to her satisfaction and she has no PT needs at this time. Please reconsult if status changes. Thank you.      Follow Up Recommendations  No PT follow up     Equipment Recommendations  None recommended by PT    Recommendations for Other Services       Precautions / Restrictions Precautions Precautions: None Restrictions Weight Bearing Restrictions: No    Mobility  Bed Mobility Overal bed mobility: Independent                Transfers Overall transfer level: Independent                  Ambulation/Gait Ambulation/Gait assistance: Independent Ambulation Distance (Feet): 500 Feet Assistive device: None Gait Pattern/deviations: Step-through pattern;Decreased stride length (Decreased knee flexion in swing phase - legs swollen.)   Gait velocity  interpretation: at or above normal speed for age/gender General Gait Details: Patient able to walk from 3E to [place] without need for rest breaks. Conversation was maintained throughout as she had a lot of questions about exercise. She performed vertical and horizontal head shakes with no LOB and was able to change pace easily to maintain pace with therapist.    Stairs            Wheelchair Mobility    Modified Rankin (Stroke Patients Only)       Balance Overall balance assessment: Independent         Standing balance support: No upper extremity supported Standing balance-Leahy Scale: Good                      Cognition Arousal/Alertness: Awake/alert Behavior During Therapy: WFL for tasks assessed/performed Overall Cognitive Status: Within Functional Limits for tasks assessed                      Exercises      General Comments General comments (skin integrity, edema, etc.): Swelling has gone down significantly per patient report but is still noticeable to her.      Pertinent Vitals/Pain Pain Assessment: No/denies pain    Home Living                      Prior Function            PT Goals (current goals can now be  found in the care plan section) Acute Rehab PT Goals Patient Stated Goal: To go home and get back to walking. PT Goal Formulation: With patient Time For Goal Achievement: 08/20/15 Potential to Achieve Goals: Good Progress towards PT goals: Goals met/education completed, patient discharged from PT    Frequency       PT Plan Discharge plan needs to be updated    Co-evaluation             End of Session   Activity Tolerance: Patient tolerated treatment well Patient left: in bed;with call bell/phone within reach     Time: 1041-1103 PT Time Calculation (min) (ACUTE ONLY): 22 min  Charges:  $Gait Training: 8-22 mins                    G CodesRoanna Epley, SPT [phone] 08/15/2015, 1:37 PM  I  have read, reviewed and agree with student's note.   [place] [phone] (pager)

## 2015-08-15 NOTE — Progress Notes (Signed)
Pt requested mustard packets r/t any leg cramping that she may experience tonight.  Gave x2.

## 2015-08-16 LAB — BASIC METABOLIC PANEL
Anion gap: 10 (ref 5–15)
BUN: 19 mg/dL (ref 6–20)
CO2: 32 mmol/L (ref 22–32)
Calcium: 9.9 mg/dL (ref 8.9–10.3)
Chloride: 97 mmol/L — ABNORMAL LOW (ref 101–111)
Creatinine, Ser: 1.64 mg/dL — ABNORMAL HIGH (ref 0.44–1.00)
GFR calc Af Amer: 37 mL/min — ABNORMAL LOW (ref >60)
GFR calc non Af Amer: 32 mL/min — ABNORMAL LOW (ref >60)
Glucose, Bld: 112 mg/dL — ABNORMAL HIGH (ref 65–99)
Potassium: 5.1 mmol/L (ref 3.5–5.1)
Sodium: 139 mmol/L (ref 135–145)

## 2015-08-16 MED ORDER — LISINOPRIL 5 MG PO TABS
5.0000 mg | ORAL_TABLET | Freq: Two times a day (BID) | ORAL | Status: DC
  Administered 2015-08-16 – 2015-08-19 (×5): 5 mg via ORAL
  Filled 2015-08-16 (×6): qty 1

## 2015-08-16 MED ORDER — FUROSEMIDE 10 MG/ML IJ SOLN: 80.0000 mg | Freq: Two times a day (BID) | INTRAMUSCULAR | Status: DC

## 2015-08-16 MED ORDER — FUROSEMIDE 10 MG/ML IJ SOLN
80.0000 mg | Freq: Three times a day (TID) | INTRAMUSCULAR | Status: DC
  Administered 2015-08-16 – 2015-08-19 (×9): 80 mg via INTRAVENOUS
  Filled 2015-08-16 (×10): qty 8

## 2015-08-16 NOTE — Progress Notes (Signed)
Patient ID: Deborah Ho, female   DOB: 30-Aug-1951, 64 y.o.   MRN: [id number]  Advanced Heart Failure Rounding Note  PCP: [name] NP-C Primary Cardiologist: Dr Shirlee Latch  Subjective:    Feels like swelling is much better.  No dizziness or lightheadedness.  Walking the halls without difficulty.  Out 2.3 L and weight down on 80 mg IV lasix TID.  Creatine bump 1.32 -> 1.64   Objective:   Weight Range: 196 lb 2.2 oz (88.969 kg) Body mass index is 32.64 kg/(m^2).   Vital Signs:   Temp:  [97.9 F (36.6 C)-98.2 F (36.8 C)] 97.9 F (36.6 C) (09/30 0505) Pulse Rate:  [89-91] 91 (09/30 0505) Resp:  [18] 18 (09/30 0505) BP: (113-126)/(69-88) 126/88 mmHg (09/30 0505) SpO2:  [98 %-99 %] 98 % (09/30 0505) Weight:  [196 lb 2.2 oz (88.969 kg)] 196 lb 2.2 oz (88.969 kg) (09/30 0505) Last BM Date: 08/13/15  Weight change: Filed Weights   08/14/15 0426 08/15/15 0521 08/16/15 0505  Weight: 212 lb 11.2 oz (96.48 kg) 209 lb 9.6 oz (95.074 kg) 196 lb 2.2 oz (88.969 kg)    Intake/Output:   Intake/Output Summary (Last 24 hours) at 08/16/15 0718 Last data filed at 08/16/15 0122  Gross per 24 hour  Intake    872 ml  Output   3250 ml  Net  -2378 ml     Physical Exam: General:  Well appearing. NAD HEENT: normal Neck: supple. JVP 14 cm. Carotids 2+ bilat; no bruits. No lymphadenopathy or thyromegaly noted Cor: PMI nondisplaced. RRR. No rubs, gallops or murmurs appreciated Lungs: Clear Abdomen: soft, NT, ND. No HSM. No bruits or masses. +BS Extremities: no cyanosis, clubbing or rash. 2+ LE edema to knees Neuro: alert & orientedx3, cranial nerves grossly intact. moves all 4 extremities w/o difficulty. Affect pleasant  Telemetry: NSR 80s  Labs: CBC No results for input(s): WBC, NEUTROABS, HGB, HCT, MCV, PLT in the last 72 hours. Basic Metabolic Panel  Recent Labs  08/15/15 0309 08/16/15 0345  NA 140 139  K 4.2 5.1  CL 100* 97*  CO2 29 32  GLUCOSE 120* 112*  BUN 20 19   CALCIUM 9.4 9.9  MG 2.0  --    Liver Function Tests No results for input(s): AST, ALT, ALKPHOS, BILITOT, PROT, ALBUMIN in the last 72 hours. No results for input(s): LIPASE, AMYLASE in the last 72 hours. Cardiac Enzymes No results for input(s): CKTOTAL, CKMB, CKMBINDEX, TROPONINI in the last 72 hours.  BNP: BNP (last 3 results)  Recent Labs  05/01/15 1505 08/10/15 1603 08/12/15 1703  BNP 1535.7* 2104.4* 1799.8*    ProBNP (last 3 results)  Recent Labs  08/22/14 1226 09/13/14 1854 11/05/14 1143  PROBNP 3059.0* 12687.0* 321.0*     D-Dimer No results for input(s): DDIMER in the last 72 hours. Hemoglobin A1C No results for input(s): HGBA1C in the last 72 hours. Fasting Lipid Panel No results for input(s): CHOL, HDL, LDLCALC, TRIG, CHOLHDL, LDLDIRECT in the last 72 hours. Thyroid Function Tests No results for input(s): TSH, T4TOTAL, T3FREE, THYROIDAB in the last 72 hours.  Invalid input(s): FREET3  Other results:     Imaging/Studies:  Mr Card Morphology Wo/w Cm  08/14/2015   CLINICAL DATA:  Cardiomyopathy,?LV thrombus on echo.  EXAM: CARDIAC MRI  TECHNIQUE: The patient was scanned on a 1.5 Tesla GE magnet. A dedicated cardiac coil was used. Functional imaging was done using Fiesta sequences. 2,3, and 4 chamber views were done to assess for RWMA's.  Modified Simpson's rule using a short axis stack was used to calculate an ejection fraction on a dedicated work [employer]. The patient received 29 cc of Multihance. After 10 minutes inversion recovery sequences were used to assess for infiltration and scar tissue.  CONTRAST:  29 cc Multihance  FINDINGS: There is a small to moderate right pleural effusion. Limited images of lung fields showed no other abnormalities.  The left ventricle was mildly dilated. There was septal dyskinesis, anterior akinesis, and apical akinesis. There was no LV thrombus noted. EF 19%. Mildly dilated RV with mildly decreased  systolic function. Moderate biatrial enlargement. Moderate TR, moderate MR. Trileaflet aortic valve without significant regurgitation or stenosis.  On delayed enhancement imaging, there was full thickness late gadolinium enhancement (LGE) in the mid to apical anteroseptal and inferoseptal walls, at the apex, and in the apical lateral wall. The mid-apical anterior wall had 76-99% wall thickness subendocardial LGE.  MEASUREMENTS: MEASUREMENTS LV EDV 215 mL  LV SV 41 mL  LV EF 19%  IMPRESSION: 1. Mildly dilated LV with wall motion abnormalities as noted above. EF 19%.  2.  Mildly dilated RV with mildly decreased systolic function.  3.  No LV thrombus.  4. LGE shows scar throughout the mid to apical septal wall, the apical lateral wall, the apex, and the anterior wall. These walls appear nonviable and are unlikely to improve with revascularization.  Daichi Moris   Electronically Signed   By: [name] M.D.   On: 08/14/2015 17:07    Latest Echo  Latest Cath   Medications:     Scheduled Medications: . atorvastatin  40 mg Oral q1800  . enoxaparin (LOVENOX) injection  40 mg Subcutaneous Q24H  . furosemide  80 mg Intravenous 3 times per day  . lisinopril  7.5 mg Oral BID  . potassium chloride SA  40 mEq Oral BID  . sodium chloride  3 mL Intravenous Q12H  . spironolactone  25 mg Oral Daily  . ticagrelor  60 mg Oral BID    Infusions:    PRN Medications: sodium chloride, acetaminophen, chlorpheniramine-HYDROcodone, loratadine, ondansetron (ZOFRAN) IV, sodium chloride   Assessment/Plan   1. Acute on chronic systolic CHF: EF 61% on cMRI 08/14/15, ischemic cardiomyopathy.She diuresed well yesterday, weight indescrepancy between bed and standing weights. Nearing baseline but remains volume overloaded. Baseline weight in 190s. - Continue Lasix 80 mg IV TID, continue spironolactone 25 daily. May be limited by increasing cr. Will reassess optimal dosing in am. - Continue lisinopril 7.5 mg bid.   - Restart Coreg when more euvolemic.  - Cardiac MRI 08/14/15 - EF 19%. RV mildly decreases systolic function. No LV Thrombus.  Scarring with non viable apical septal, apical lateral, apex, and anterior wall. Unlikely to improve with revascularization.  - Does not have great insight into her disease process, but seems to understand better after long conversation with HF team and pharmacy this morning. Willing to tolerate med titration, but is relatively sensitive to decreases in her SBP.  - Intolerant of TED hose 2. CAD: s/p anterior MI with history of stent thrombosis after missing Brilinta.  -Continue Brilinta 60 mg bid + statin.  Angioedema with aspirin.  3. AKI on CKD stage III:  - Creatinine 1.32 -> 1.64.  - Will follow closely with diuresis. 4. Electrolytes - K borderline hyper at 5.1 today.  Will hold K supp for now, reassess daily.  Graciella Freer, PA-C 08/16/2015 7:18 AM   Advanced Heart Failure Team Pager  [phone] (M-F; 7a - 4p)  Please contact [facility] for night-coverage after hours (4p -7a ) and weekends on [email or web address]  Patient seen with PA, agree with the above note.  Creatinine up a bit with stable BUN. She is still very volume overloaded. She continues to diurese well.  - Decrease lisinopril to 5 mg daily with rise in creatinine.  Bidil may be a difficult medication for her to take at home so will hold off for now.  - Continue current Lasix, still has a ways to go in terms of diuresis.  No K replacement given high normal K today.  If creatinine rises further tomorrow, may have to back off.  - Add Coreg when more euvolemic.   [name] 08/16/2015 12:57 PM

## 2015-08-16 NOTE — Plan of Care (Signed)
Problem: Phase I Progression Outcomes Goal: EF % per last Echo/documented,Core Reminder form on chart Outcome: Completed/Met Date Met:  08/16/15 EF = 20% 01/30/15

## 2015-08-17 DIAGNOSIS — I25118 Atherosclerotic heart disease of native coronary artery with other forms of angina pectoris: Secondary | ICD-10-CM

## 2015-08-17 DIAGNOSIS — N189 Chronic kidney disease, unspecified: Secondary | ICD-10-CM

## 2015-08-17 DIAGNOSIS — N179 Acute kidney failure, unspecified: Secondary | ICD-10-CM

## 2015-08-17 LAB — BASIC METABOLIC PANEL
Anion gap: 12 (ref 5–15)
BUN: 24 mg/dL — ABNORMAL HIGH (ref 6–20)
CO2: 26 mmol/L (ref 22–32)
Calcium: 9.4 mg/dL (ref 8.9–10.3)
Chloride: 97 mmol/L — ABNORMAL LOW (ref 101–111)
Creatinine, Ser: 1.42 mg/dL — ABNORMAL HIGH (ref 0.44–1.00)
GFR calc Af Amer: 44 mL/min — ABNORMAL LOW (ref >60)
GFR calc non Af Amer: 38 mL/min — ABNORMAL LOW (ref >60)
Glucose, Bld: 97 mg/dL (ref 65–99)
Potassium: 4.3 mmol/L (ref 3.5–5.1)
Sodium: 135 mmol/L (ref 135–145)

## 2015-08-17 NOTE — Progress Notes (Addendum)
Patient ID: Deborah Ho, female   DOB: Aug 23, 1951, 64 y.o.   MRN: [id number]   PCP: [name] NP-C Primary Cardiologist: Dr Shirlee Latch  Subjective:    continues to make slow improvement.  Weight continues to improve.  Net -15L since admit   Objective:   Weight Range: 188 lb 3.2 oz (85.367 kg) Body mass index is 31.32 kg/(m^2).   Vital Signs:   Temp:  [98.1 F (36.7 C)-98.2 F (36.8 C)] 98.1 F (36.7 C) (10/01 0456) Pulse Rate:  [88-93] 90 (10/01 0456) Resp:  [18] 18 (10/01 0456) BP: (106-119)/(62-76) 117/66 mmHg (10/01 0920) SpO2:  [98 %-99 %] 98 % (10/01 0456) Weight:  [188 lb 3.2 oz (85.367 kg)] 188 lb 3.2 oz (85.367 kg) (10/01 0456) Last BM Date: 08/16/15  Weight change: Filed Weights   08/15/15 0521 08/16/15 0505 08/17/15 0456  Weight: 209 lb 9.6 oz (95.074 kg) 196 lb 2.2 oz (88.969 kg) 188 lb 3.2 oz (85.367 kg)    Intake/Output:   Intake/Output Summary (Last 24 hours) at 08/17/15 1143 Last data filed at 08/17/15 0916  Gross per 24 hour  Intake   1518 ml  Output   3750 ml  Net  -2232 ml     Physical Exam: General:  Well appearing. NAD HEENT: normal Neck: supple. JVP 12 cm. Carotids 2+ bilat; no bruits. No lymphadenopathy or thyromegaly noted Cor: PMI nondisplaced. RRR. No rubs, gallops or murmurs appreciated Lungs: Clear Abdomen: soft, NT, ND. No HSM. No bruits or masses. +BS Extremities: no cyanosis, clubbing or rash. 2+ LE edema to knees Neuro: alert & orientedx3, cranial nerves grossly intact. moves all 4 extremities w/o difficulty. Affect pleasant  Telemetry: NSR 80s  Labs: CBC No results for input(s): WBC, NEUTROABS, HGB, HCT, MCV, PLT in the last 72 hours. Basic Metabolic Panel  Recent Labs  08/15/15 0309 08/16/15 0345 08/17/15 0229  NA 140 139 135  K 4.2 5.1 4.3  CL 100* 97* 97*  CO2 29 32 26  GLUCOSE 120* 112* 97  BUN 20 19 24*  CALCIUM 9.4 9.9 9.4  MG 2.0  --   --    Liver Function Tests No results for input(s): AST, ALT,  ALKPHOS, BILITOT, PROT, ALBUMIN in the last 72 hours. No results for input(s): LIPASE, AMYLASE in the last 72 hours. Cardiac Enzymes No results for input(s): CKTOTAL, CKMB, CKMBINDEX, TROPONINI in the last 72 hours.  BNP: BNP (last 3 results)  Recent Labs  05/01/15 1505 08/10/15 1603 08/12/15 1703  BNP 1535.7* 2104.4* 1799.8*    ProBNP (last 3 results)  Recent Labs  08/22/14 1226 09/13/14 1854 11/05/14 1143  PROBNP 3059.0* 12687.0* 321.0*    Medications:     Scheduled Medications: . atorvastatin  40 mg Oral q1800  . enoxaparin (LOVENOX) injection  40 mg Subcutaneous Q24H  . furosemide  80 mg Intravenous TID  . lisinopril  5 mg Oral BID  . sodium chloride  3 mL Intravenous Q12H  . spironolactone  25 mg Oral Daily  . ticagrelor  60 mg Oral BID    PRN Medications: sodium chloride, acetaminophen, chlorpheniramine-HYDROcodone, loratadine, ondansetron (ZOFRAN) IV, sodium chloride   Assessment/Plan   1. Acute on chronic systolic CHF: EF 63% on cMRI 08/14/15, ischemic cardiomyopathy.She continues to improve with diuresis.  Creatinine is stable.  She remains very volume overloaded. Sodium restriction advised - Continue Lasix 80 mg IV TID, continue spironolactone 25 daily.  - Continue lisinopril 5 mg bid.  - Restart Coreg when more euvolemic.  2. CAD: s/p anterior MI with history of stent thrombosis after missing Brilinta.  -Continue Brilinta 60 mg bid + statin.  Angioedema with aspirin.   3. AKI on CKD stage III:  Stable No change required today - Will follow closely with diuresis.   Hillis Range MD 08/17/2015 11:43 AM

## 2015-08-18 DIAGNOSIS — N183 Chronic kidney disease, stage 3 unspecified: Secondary | ICD-10-CM | POA: Insufficient documentation

## 2015-08-18 DIAGNOSIS — I10 Essential (primary) hypertension: Secondary | ICD-10-CM

## 2015-08-18 NOTE — Progress Notes (Signed)
The patient refused her SCDs and her ted hose.

## 2015-08-19 LAB — CREATININE, SERUM
Creatinine, Ser: 1.52 mg/dL — ABNORMAL HIGH (ref 0.44–1.00)
GFR calc Af Amer: 41 mL/min — ABNORMAL LOW (ref >60)
GFR calc non Af Amer: 35 mL/min — ABNORMAL LOW (ref >60)

## 2015-08-19 LAB — URIC ACID: Uric Acid, Serum: 9.5 mg/dL — ABNORMAL HIGH (ref 2.3–6.6)

## 2015-08-19 MED ORDER — TORSEMIDE 20 MG PO TABS: 40.0000 mg | ORAL_TABLET | Freq: Two times a day (BID) | ORAL | Status: DC

## 2015-08-19 MED ORDER — CARVEDILOL 3.125 MG PO TABS: 3.1250 mg | ORAL_TABLET | Freq: Two times a day (BID) | ORAL | Status: DC

## 2015-08-19 MED ORDER — POTASSIUM CHLORIDE CRYS ER 20 MEQ PO TBCR: 20.0000 meq | EXTENDED_RELEASE_TABLET | Freq: Two times a day (BID) | ORAL | Status: DC

## 2015-08-19 MED ORDER — PREDNISONE 20 MG PO TABS
40.0000 mg | ORAL_TABLET | Freq: Every day | ORAL | Status: DC
Start: 2015-08-19 — End: 2015-08-19

## 2015-08-19 MED ORDER — TICAGRELOR 60 MG PO TABS: 60.0000 mg | ORAL_TABLET | Freq: Two times a day (BID) | ORAL | Status: DC

## 2015-08-19 MED ORDER — PREDNISONE 20 MG PO TABS: 40.0000 mg | ORAL_TABLET | Freq: Every day | ORAL | Status: DC

## 2015-08-19 MED ORDER — VITAMIN D 1000 UNITS PO TABS: 2000.0000 [IU] | ORAL_TABLET | Freq: Every day | ORAL | Status: DC

## 2015-08-19 MED ORDER — POTASSIUM CHLORIDE CRYS ER 20 MEQ PO TBCR
20.0000 meq | EXTENDED_RELEASE_TABLET | Freq: Two times a day (BID) | ORAL | Status: DC
  Administered 2015-08-19: 20 meq via ORAL
  Filled 2015-08-19: qty 1

## 2015-08-19 MED ORDER — LISINOPRIL 5 MG PO TABS
5.0000 mg | ORAL_TABLET | Freq: Two times a day (BID) | ORAL | Status: DC
Start: 2015-08-19 — End: 2015-09-02

## 2015-08-19 MED ORDER — SPIRONOLACTONE 25 MG PO TABS: 25.0000 mg | ORAL_TABLET | Freq: Every day | ORAL | Status: DC

## 2015-08-19 MED ORDER — ATORVASTATIN CALCIUM 80 MG PO TABS: 40.0000 mg | ORAL_TABLET | Freq: Every day | ORAL | Status: DC

## 2015-08-19 NOTE — Progress Notes (Signed)
Patient ID: Deborah Ho, female   DOB: 1951/01/11, 64 y.o.   MRN: [id number]  Advanced Heart Failure Rounding Note  PCP: [name] NP-C Primary Cardiologist/HF: Dr Shirlee Latch  Subjective:    Feels much better.  Swelling much better but legs sore.  Has never had gout before.  Says she was taking her torsemide 20 mg BID at home instead of 60 qam and 40 qpm.  Out 1.3 and down 4 lbs on 80 mg IV lasix TID.  Creatine relatively stable 1.64 -> 1.42 -> 1.52   Objective:   Weight Range: 183 lb 1.6 oz (83.054 kg) Body mass index is 30.47 kg/(m^2).   Vital Signs:   Temp:  [97.3 F (36.3 C)-98.6 F (37 C)] 98.6 F (37 C) (10/03 0526) Pulse Rate:  [93-98] 98 (10/03 0526) Resp:  [17-20] 18 (10/03 0526) BP: (98-121)/(57-72) 111/59 mmHg (10/03 0526) SpO2:  [96 %-98 %] 96 % (10/03 0526) Weight:  [183 lb 1.6 oz (83.054 kg)] 183 lb 1.6 oz (83.054 kg) (10/03 0526) Last BM Date: 08/18/15  Weight change: Filed Weights   08/17/15 0456 08/18/15 0428 08/19/15 0526  Weight: 188 lb 3.2 oz (85.367 kg) 187 lb 3.2 oz (84.913 kg) 183 lb 1.6 oz (83.054 kg)    Intake/Output:   Intake/Output Summary (Last 24 hours) at 08/19/15 0704 Last data filed at 08/19/15 0525  Gross per 24 hour  Intake   1080 ml  Output   2475 ml  Net  -1395 ml     Physical Exam: General:  Well appearing. NAD HEENT: normal Neck: supple. JVP 7-8 cm. Carotids 2+ bilat; no bruits. No lymphadenopathy or thyromegaly  Cor: PMI nondisplaced. RRR. No rubs, gallops or murmurs  Lungs: CTA Abdomen: soft, NT, ND. No HSM. No bruits or masses. +BS Extremities: no cyanosis, clubbing or rash. Trace-1+ pretibial edema to knees Neuro: alert & orientedx3, cranial nerves grossly intact. moves all 4 extremities w/o difficulty. Affect pleasant  Telemetry: NSR 90s  Labs: CBC No results for input(s): WBC, NEUTROABS, HGB, HCT, MCV, PLT in the last 72 hours. Basic Metabolic Panel  Recent Labs  08/17/15 0229  NA 135  K 4.3  CL 97*   CO2 26  GLUCOSE 97  BUN 24*  CALCIUM 9.4   Liver Function Tests No results for input(s): AST, ALT, ALKPHOS, BILITOT, PROT, ALBUMIN in the last 72 hours. No results for input(s): LIPASE, AMYLASE in the last 72 hours. Cardiac Enzymes No results for input(s): CKTOTAL, CKMB, CKMBINDEX, TROPONINI in the last 72 hours.  BNP: BNP (last 3 results)  Recent Labs  05/01/15 1505 08/10/15 1603 08/12/15 1703  BNP 1535.7* 2104.4* 1799.8*    ProBNP (last 3 results)  Recent Labs  08/22/14 1226 09/13/14 1854 11/05/14 1143  PROBNP 3059.0* 12687.0* 321.0*     D-Dimer No results for input(s): DDIMER in the last 72 hours. Hemoglobin A1C No results for input(s): HGBA1C in the last 72 hours. Fasting Lipid Panel No results for input(s): CHOL, HDL, LDLCALC, TRIG, CHOLHDL, LDLDIRECT in the last 72 hours. Thyroid Function Tests No results for input(s): TSH, T4TOTAL, T3FREE, THYROIDAB in the last 72 hours.  Invalid input(s): FREET3  Other results:     Imaging/Studies:  No results found.  Latest Echo  Latest Cath   Medications:     Scheduled Medications: . atorvastatin  40 mg Oral q1800  . enoxaparin (LOVENOX) injection  40 mg Subcutaneous Q24H  . furosemide  80 mg Intravenous TID  . lisinopril  5 mg Oral BID  .  sodium chloride  3 mL Intravenous Q12H  . spironolactone  25 mg Oral Daily  . ticagrelor  60 mg Oral BID    Infusions:    PRN Medications: sodium chloride, acetaminophen, chlorpheniramine-HYDROcodone, loratadine, ondansetron (ZOFRAN) IV, sodium chloride   Assessment/Plan   1. Acute on chronic systolic CHF: EF 59% on cMRI 08/14/15, ischemic cardiomyopathy. - Volume status much improved. - Weight is below previous baseline of 190. She is out 15L and down 22 lbs overall.  - Transition to po Torsemide 40 mg BID (was only taking 20 mg BID at home) - Continue spironolactone 25 daily.  - Continue lisinopril 5 mg bid.  - Add Coreg 3.125 mg bid.  - Cardiac  MRI 08/14/15 - EF 19%. RV mildly decreases systolic function. No LV Thrombus.  Scarring with non viable apical septal, apical lateral, apex, and anterior wall. Unlikely to improve with revascularization.  - Poor insight into her disease process - Intolerant of TED hose, refuses SCDs 2. CAD: s/p anterior MI with history of stent thrombosis after missing Brilinta.  -Continue Brilinta 60 mg bid + statin.  Angioedema with aspirin.  3. AKI on CKD stage III:  - Creatinine 1.64 -> 1.42 -> 1.52  - Will follow closely with diuresis. 4. LE soreness - Will check uric acid, has been elevated previously.   Dispo: Home today vs tomorrow.  Graciella Freer, PA-C 08/19/2015 7:04 AM   Advanced Heart Failure Team Pager [phone] (M-F; 7a - 4p)  Please contact [facility] for night-coverage after hours (4p -7a ) and weekends on [email or web address]  Patient seen with PA, agree with the above note.  She has lost considerable weight and looks near-euvolemic at this point.  I think she is ready for discharge.  Will arrange for home health and paramedicine program.  Will start Coreg 3.125 mg bid.   Home meds: Coreg 3.125 mg bid, lisinopril 5 mg bid, spironolactone 25 mg daily, torsemide 40 mg bid, KCl 20 bid, ticagrelor 60 bid, home statin.  No ASA with history of angioedema.   Followup with me in a week or so.  [name] 08/19/2015 8:58 AM

## 2015-08-19 NOTE — Progress Notes (Signed)
Pt is discharged, discharged instructions provided and patient showed understanding to it, IV taken out, following up with PCP in 1 week, pt left the unit in wheelchair.

## 2015-08-19 NOTE — Discharge Summary (Signed)
Advanced Heart Failure Team  Discharge Summary   Patient ID: Deborah Ho MRN: [id number], DOB/AGE: 64/09/1951 65 y.o. Admit date: 08/12/2015 D/C date:     08/19/2015   Primary Discharge Diagnoses:  1. Acute on chronic systolic CHF: EF 50% on cMRI 08/14/15, ischemic cardiomyopathy. - Improved with 80 mg IV lasix TID and will go home with increased diuretic regimen of 40 mg Torsemide BID. 2. CAD: s/p anterior MI with history of stent thrombosis after missing Brilinta.  -Continue Brilinta 60 mg bid + statin. Angioedema with aspirin.  3. AKI on CKD stage III:  - Stable prior to discharge.  Hospital Course:  Deborah Ho is a 64 y.o. female with a history of HTN, HLD, tobacco abuse, CAD s/panterior STEMI with early stent thrombosis for missing doses of Brilinta s/p PCI x 2 into LAD (04/2014) and chronic systolic HF with EF 20% by 3/16 echo.   She presented to the HF clinic on 08/12/15 as an acute add on for increased leg edema and DOE. She was found to be > 30 lbs up from previous baseline weights and admitted for IV diuresis. Diuresis slowed down on IV lasix 80 mg BID despite continued marked volume overload, so was increased to 80 mg TID.   Hospital course was complicated with hypotension and dizziness that improved with decrease of lisinopril from 7.5 mg BID to 5 mg BID. C/o leg pains with elevated uric acid day of discharge so was also given short course of prednisone. Medications were titrated as able.  She was willing to participate in [facility] as she had benefited from it in the best.  Does not wish to be part of HF paramedicine program for now.    Overall she diuresed 15 L and 36 lbs on 80 mg IV lasix TID.  She was transitioned to Torsemide 40 BID for home use. She will be discharged home in stable condition with HH through [facility] with diuretic protocol in place.   Discharge Weight Range: 183 lb  Discharge Vitals: Blood pressure 109/58, pulse 95, temperature 98.6 F (37 C), temperature  source Oral, resp. rate 18, height 5\' 5"  (1.651 m), weight 183 lb 1.6 oz (83.054 kg), SpO2 98 %.  Labs: Lab Results  Component Value Date   WBC 8.7 08/12/2015   HGB 14.1 08/12/2015   HCT 45.4 08/12/2015   MCV 84.9 08/12/2015   PLT 353 08/12/2015    Recent Labs Lab 08/12/15 1703  08/17/15 0229 08/19/15 0540  NA 140  < > 135  --   K 3.3*  < > 4.3  --   CL 99*  < > 97*  --   CO2 31  < > 26  --   BUN 19  < > 24*  --   CREATININE 1.30*  < > 1.42* 1.52*  CALCIUM 9.0  < > 9.4  --   PROT 6.4*  --   --   --   BILITOT 1.8*  --   --   --   ALKPHOS 210*  --   --   --   ALT 17  --   --   --   AST 31  --   --   --   GLUCOSE 98  < > 97  --   < > = values in this interval not displayed. Lab Results  Component Value Date   CHOL 209* 07/04/2014   HDL 48 07/04/2014   LDLCALC 127* 07/04/2014   TRIG 171* 07/04/2014  BNP (last 3 results)  Recent Labs  05/01/15 1505 08/10/15 1603 08/12/15 1703  BNP 1535.7* 2104.4* 1799.8*    ProBNP (last 3 results)  Recent Labs  08/22/14 1226 09/13/14 1854 11/05/14 1143  PROBNP 3059.0* 12687.0* 321.0*     Diagnostic Studies/Procedures   No results found.  Discharge Medications     Medication List    STOP taking these medications        furosemide 40 MG tablet  Commonly known as:  LASIX     hydrochlorothiazide 25 MG tablet  Commonly known as:  HYDRODIURIL      TAKE these medications        atorvastatin 80 MG tablet  Commonly known as:  LIPITOR  Take 0.5 tablets (40 mg total) by mouth daily at 6 PM.     carvedilol 3.125 MG tablet  Commonly known as:  COREG  Take 1 tablet (3.125 mg total) by mouth 2 (two) times daily with a meal.     cetirizine 10 MG tablet  Commonly known as:  ZYRTEC  Take 10 mg by mouth daily as needed for allergies.     cholecalciferol 1000 UNITS tablet  Commonly known as:  VITAMIN D  Take 2 tablets (2,000 Units total) by mouth daily.     lisinopril 5 MG tablet  Commonly known as:   PRINIVIL,ZESTRIL  Take 1 tablet (5 mg total) by mouth 2 (two) times daily.     metolazone 2.5 MG tablet  Commonly known as:  ZAROXOLYN  Take 2.5 mg by mouth. As needed for 5 lb weight gain     nitroGLYCERIN 0.4 MG SL tablet  Commonly known as:  NITROSTAT  Place 0.4 mg under the tongue every 5 (five) minutes as needed for chest pain.     ondansetron 4 MG disintegrating tablet  Commonly known as:  ZOFRAN ODT  Take 1 tablet (4 mg total) by mouth every 8 (eight) hours as needed for nausea or vomiting.     PEPCID AC 10 MG chewable tablet  Generic drug:  famotidine  Chew 10 mg by mouth 2 (two) times daily as needed for heartburn.     potassium chloride SA 20 MEQ tablet  Commonly known as:  K-DUR,KLOR-CON  Take 1 tablet (20 mEq total) by mouth 2 (two) times daily.     predniSONE 20 MG tablet  Commonly known as:  DELTASONE  Take 2 tablets (40 mg total) by mouth daily with breakfast. On 10/4 and 10/5.     spironolactone 25 MG tablet  Commonly known as:  ALDACTONE  Take 1 tablet (25 mg total) by mouth daily.     ticagrelor 60 MG Tabs tablet  Commonly known as:  BRILINTA  Take 1 tablet (60 mg total) by mouth 2 (two) times daily.     torsemide 20 MG tablet  Commonly known as:  DEMADEX  Take 2 tablets (40 mg total) by mouth 2 (two) times daily.        Disposition   The patient will be discharged in stable condition to home with [facility]. Pt refused HF paramedicine program.  Discharge Instructions    ACE Inhibitor / ARB already ordered    Complete by:  As directed      Diet - low sodium heart healthy    Complete by:  As directed      Heart Failure patients record your daily weight using the same scale at the same time of day    Complete by:  As  directed      Increase activity slowly    Complete by:  As directed           Follow-up Information    Follow up with [name], MD On 08/30/2015.   Specialty:  Cardiology   Why:  at 0920 am for post hospital follow up.  Please  bring your medications with you to your visit.  Code for patient parking is 0900.   Contact information:   [address] [place] [address] [phone]         Duration of Discharge Encounter: Greater than 35 minutes   Signed, Graciella Freer PA-C 08/19/2015, 11:18 AM

## 2015-08-19 NOTE — Progress Notes (Signed)
Heart Failure Navigator Consult Note  Presentation: Deborah Ho is a 64 y.o. female with a history of HTN, HLD, tobacco abuse, CAD s/panterior STEMI with early stent thrombosis for missing doses of Brilinta s/p PCI x 2 into LAD (04/2014) and chronic systolic HF with EF 20% by 3/16 echo.  She has been seen by PCP over the last 2-3 weeks with increased weight gain noted at each visit. She was evaluated in [facility] ED 9/24 with increased dyspnea and leg edema. Discharged on lasix for 7 days with potassium. Says she has had progressive leg edema now extending to thighs and abdomen. Having difficultly walking.+ orthopnea. Drinking lots of fluid. Taking meds except she was not taking carvedilol.    Past Medical History  Diagnosis Date  . GERD (gastroesophageal reflux disease)     Probable  . CAD (coronary artery disease) 05/03/14; 05/09/14    a. anterior STEMI with early in-stent thorombosis for missed dose of Brillinta s/p PCI with DESx 2 into LAD (04/2014)  . HTN (hypertension)   . Hyperlipidemia   . Tobacco use   . Non compliance w medication regimen   . Ischemic cardiomyopathy     a. 04/2014 ECHO with EF 45-50% b.  Repeat 2D echo 08/14/14 with EF down at 15%. Life vest placed    Social History   Social History  . Marital Status: Widowed    Spouse Name: N/A  . Number of Children: N/A  . Years of Education: N/A   Occupational History  . not employed    Social History Main Topics  . Smoking status: Current Every Day Smoker -- 0.10 packs/day for .5 years    Types: Cigarettes  . Smokeless tobacco: Current User     Comment: down to 3 cigarettes daily (08/03/14)  . Alcohol Use: No  . Drug Use: No  . Sexual Activity: Not Asked   Other Topics Concern  . None   Social History Narrative   Patient has 6 brothers and sisters and none have known coronary artery disease. She lives alone in [place], but has several brothers in the area.    ECHO:Study Conclusions--07/25/15  - Left ventricle:  Inferior septal and apical akinesis . Diffuse hypokinesis. Cannot r/o mural apical thrombus Suggest cardiac MRI if clincially indicated. The cavity size was severely dilated. Wall thickness was increased in a pattern of mild LVH. The estimated ejection fraction was 25%. - Mitral valve: There was mild regurgitation. - Left atrium: The atrium was moderately dilated. - Right atrium: The atrium was moderately dilated. - Atrial septum: No defect or patent foramen ovale was identified. - Impressions: Possible ascites.  Impressions:  - Possible ascites.  Transthoracic echocardiography. M-mode, complete 2D, spectral Doppler, and color Doppler. Birthdate: Patient birthdate: 1951-03-14. Age: Patient is 64 yr old. Sex: Gender: female. BMI: 31.5 kg/m^2. Patient status: Outpatient. Study date: Study date: 07/25/2015. Study time: 02:22 PM. Location: Echo laboratory.  BNP    Component Value Date/Time   BNP 1799.8* 08/12/2015 1703    ProBNP    Component Value Date/Time   PROBNP 321.0* 11/05/2014 1143     Education Assessment and Provision:  Detailed education and instructions provided on heart failure disease management including the following:  Signs and symptoms of Heart Failure When to call the physician Importance of daily weights Low sodium diet Fluid restriction Medication management Anticipated future follow-up appointments  Patient education given on each of the above topics.  Patient acknowledges understanding and acceptance of all instructions.  I spoke with  Deborah Ho regarding her HF.  I know her from previous admissions.  I have completed education with her on multiple inpatient admissions as well as in the AHF Clinic.  She tells me that she does not always take Torsemide twice daily--if she's "not at home".  I have encouraged her in the importance of taking all medications exactly as they are prescribed.  She says that the Torsemide doesn't always  make her "get rid of water" it only makes her "have to go to the bathroom".  We also briefly discussed fluid restriction and she says that her "body is used to drinking lots of water".  She has refused Paramedicine in the past yet has accepted [facility] and was successful with a HHRN before.  She will follow with the AHF Clinic as an outpatient.  Education Materials:  "Living Better With Heart Failure" Booklet, Daily Weight Tracker Tool    High Risk Criteria for Readmission and/or Poor Patient Outcomes:   EF <30%- yes 25%  2 or more admissions in 6 months- 2/6  Difficult social situation- no -lives alone  Demonstrates medication noncompliance- yes--she has demonstrated medication noncompliance in the past    Barriers of Care:  Knowledge and compliance  Discharge Planning:   Plans to discharge to home alone.  She will benefit from [facility] for ongoing medication compliance, education and symptom recognition.

## 2015-08-21 ENCOUNTER — Telehealth (HOSPITAL_COMMUNITY): Payer: Self-pay | Admitting: Surgery

## 2015-08-21 NOTE — Telephone Encounter (Signed)
Heart Failure Nurse Navigator Post-Discharge Telephone Call  I called to check on Deborah Ho after her recent hospitalization.  She says she has been feeling "very well".  She has been weighing and weight today was 180 lbs versus discharge weight of 183.1lbs.  She tells me that her "BP has been running too low" and if its "too low she gets sick"--however says that she has not taken it yet today.  She claims that she has been taking all medications as of yesterday --"she got them all".   She has been "careful with salt and sodium".  I reinforced a low salt sodium and high sodium foods to avoid as well as fluid restriction.   I reminded her of her follow-up appt with the AHF Clinic on October 17th.  I encouraged her to call me back with any concerns or questions related to her HF.

## 2015-08-23 ENCOUNTER — Ambulatory Visit (HOSPITAL_COMMUNITY): Admission: RE | Admit: 2015-08-23 | Payer: No Typology Code available for payment source | Source: Ambulatory Visit

## 2015-09-02 ENCOUNTER — Ambulatory Visit (HOSPITAL_COMMUNITY)
Admit: 2015-09-02 | Discharge: 2015-09-02 | Disposition: A | Discharge status: Home / Self Care | Payer: No Typology Code available for payment source | Source: Ambulatory Visit | Attending: Cardiology | Admitting: Cardiology

## 2015-09-02 DIAGNOSIS — I5022 Chronic systolic (congestive) heart failure: Secondary | ICD-10-CM | POA: Diagnosis present

## 2015-09-02 DIAGNOSIS — I5023 Acute on chronic systolic (congestive) heart failure: Secondary | ICD-10-CM | POA: Diagnosis not present

## 2015-09-02 DIAGNOSIS — I2510 Atherosclerotic heart disease of native coronary artery without angina pectoris: Secondary | ICD-10-CM | POA: Insufficient documentation

## 2015-09-02 LAB — MAGNESIUM: Magnesium: 2.3 mg/dL (ref 1.7–2.4)

## 2015-09-02 LAB — BASIC METABOLIC PANEL
Anion gap: 9 (ref 5–15)
BUN: 38 mg/dL — ABNORMAL HIGH (ref 6–20)
CO2: 30 mmol/L (ref 22–32)
Calcium: 10.3 mg/dL (ref 8.9–10.3)
Chloride: 100 mmol/L — ABNORMAL LOW (ref 101–111)
Creatinine, Ser: 1.64 mg/dL — ABNORMAL HIGH (ref 0.44–1.00)
GFR calc Af Amer: 37 mL/min — ABNORMAL LOW (ref >60)
GFR calc non Af Amer: 32 mL/min — ABNORMAL LOW (ref >60)
Glucose, Bld: 74 mg/dL (ref 65–99)
Potassium: 4.3 mmol/L (ref 3.5–5.1)
Sodium: 139 mmol/L (ref 135–145)

## 2015-09-02 LAB — BRAIN NATRIURETIC PEPTIDE: B Natriuretic Peptide: 263.3 pg/mL — ABNORMAL HIGH (ref 0.0–100.0)

## 2015-09-02 MED ORDER — MAGNESIUM OXIDE -MG SUPPLEMENT 400 (240 MG) MG PO TABS: 400.0000 mg | ORAL_TABLET | Freq: Every day | ORAL | Status: DC

## 2015-09-02 MED ORDER — LISINOPRIL 2.5 MG PO TABS: 2.5000 mg | ORAL_TABLET | Freq: Two times a day (BID) | ORAL | Status: DC

## 2015-09-02 NOTE — Patient Instructions (Signed)
Stop taking the HCTZ  Start taking a whole tablet of the Coreg 3.125 mg twice a day  Start Lisinopril 2.5 mg one tablet twice a day  Start Mag-Ox 400 mg daily  Labs today will call if abnormal   Follow up with MD and labs in 2 weeks

## 2015-09-02 NOTE — Progress Notes (Signed)
Advanced Heart Failure Medication Review by a Pharmacist  Does the patient  feel that his/her medications are working for him/her?  yes  Has the patient been experiencing any side effects to the medications prescribed?  no  Does the patient measure his/her own blood pressure or blood glucose at home?  yes   Does the patient have any problems obtaining medications due to transportation or finances?   no  Understanding of regimen: good Understanding of indications: good Potential of compliance: good Patient understands to avoid NSAIDs. Patient understands to avoid decongestants.  Issues to address at subsequent visits: Compliance    Pharmacist comments:  Ms. Deborah Ho is a pleasant 64 yo F presenting without a medication list but able to verify most of her medication for me. She reports good compliance with her medications but states that she has misplaced her lisinopril so has not taken for at least 1 week but has been taking hctz. She also has been taking 1/2 tablet of her carvedilol BID and I verified with [facility] that she has been getting the 3.125 mg tablets. We did review the indications for her HF medications and their mechanisms. She did not have any other medication-related questions or concerns for me at this time.   [name]. Bonnye Fava, PharmD, BCPS, CPP Clinical Pharmacist Pager: [phone] Phone: [phone] 09/02/2015 9:46 AM    Time with patient: 4 minutes Preparation and documentation time: 4 minutes Total time: 8 minutes

## 2015-09-02 NOTE — Progress Notes (Signed)
Patient ID: Sarie Bynoe, female   DOB: 06/02/1951, 64 y.o.   MRN: [id number] PCP: [name] Primary HF MD: Dr Shirlee Latch   HPI: Mrs. Desravines is a 64 y.o. female with a history of HTN, HLD, tobacco abuse, CAD s/panterior STEMI with early stent thrombosis for missing doses of Brilinta s/p PCI x 2 into LAD (04/2014) and chronic systolic HF with EF 20% by 3/16 echo.   Admitted to [facility] in 3/16 with CHF and R pleural effusion. Effusion tapped (transudative). Echo with EF down to 20%. Diuresed with IV lasix and transitioned torsemide + metolazone as needed for weight >187 pounds. Creatinine was 1.7 on the day discharge. Discharge weight 183 pounds.   She was readmitted in 4/16 with CHF in the setting of dietary and ?medical noncompliance.  She was diuresed with IV Lasix and discharged.    She was admitted from clinic in 9/16 due to marked volume overload.  She was diuresed and is now down 39 lbs since last appointment.  She feels much better.  She is not short of breath walking on flat ground.  No chest pain.  No orthopnea/PND.  She is taking HCTZ and not lisinopril.  She is only taking 1/2 a Coreg 3.125 mg tablet twice a day (was confused by discharge instructions).  Otherwise, she is taking meds as ordered.    9/16: Cardiac MRI with EF 19%, LAD territory scar (nonviable), no thrombus.  07/25/2015: ECHO EF 25% RV normal 01/30/2015 ECHO EF 20%  11/13/2014: ECHO EF 35-40% RV normal.   Labs 02/05/2015: K 4.7 Creatinine 1.72  Labs 01/28/2015: K 3.4 Creatinine 1.16  Labs 4/16: K 3.3, creatinine 1.72 Labs 6/16: K 2.9, creatinine 0.97, BNP 1536, HCT 42.2 Labs 7/16: K 3.4, creatinine 1.18 Labs 9/16: creatinine 1.5  ROS: All systems negative except as listed in HPI, PMH and Problem List.  SH:  Social History   Social History  . Marital Status: Widowed    Spouse Name: N/A  . Number of Children: N/A  . Years of Education: N/A   Occupational History  . not employed    Social History Main  Topics  . Smoking status: Current Every Day Smoker -- 0.10 packs/day for .5 years    Types: Cigarettes  . Smokeless tobacco: Current User     Comment: down to 3 cigarettes daily (08/03/14)  . Alcohol Use: No  . Drug Use: No  . Sexual Activity: Not on file   Other Topics Concern  . Not on file   Social History Narrative   Patient has 6 brothers and sisters and none have known coronary artery disease. She lives alone in [place], but has several brothers in the area.    FH:  Family History  Problem Relation Age of Onset  . Diabetes Mother   . Hypertension Mother     Past Medical History  Diagnosis Date  . GERD (gastroesophageal reflux disease)     Probable  . CAD (coronary artery disease) 05/03/14; 05/09/14    a. anterior STEMI with early in-stent thorombosis for missed dose of Brillinta s/p PCI with DESx 2 into LAD (04/2014)  . HTN (hypertension)   . Hyperlipidemia   . Tobacco use   . Non compliance w medication regimen   . Ischemic cardiomyopathy     a. 04/2014 ECHO with EF 45-50% b.  Repeat 2D echo 08/14/14 with EF down at 15%. Life vest placed    Current Outpatient Prescriptions  Medication Sig Dispense Refill  .  atorvastatin (LIPITOR) 80 MG tablet Take 0.5 tablets (40 mg total) by mouth daily at 6 PM. 15 tablet 6  . carvedilol (COREG) 3.125 MG tablet Take 1 tablet (3.125 mg total) by mouth 2 (two) times daily with a meal. 60 tablet 6  . cetirizine (ZYRTEC) 10 MG tablet Take 10 mg by mouth daily as needed for allergies.    . famotidine (PEPCID AC) 10 MG chewable tablet Chew 10 mg by mouth 2 (two) times daily as needed for heartburn.    . potassium chloride SA (K-DUR,KLOR-CON) 20 MEQ tablet Take 20-40 mEq by mouth 2 (two) times daily. Take 40 mEq (2 tablets) in the morning and 20 mEq (1 tablet) in the afternoon    . spironolactone (ALDACTONE) 25 MG tablet Take 1 tablet (25 mg total) by mouth daily. 30 tablet 6  . ticagrelor (BRILINTA) 60 MG TABS tablet Take 1 tablet (60 mg  total) by mouth 2 (two) times daily. 60 tablet 6  . torsemide (DEMADEX) 20 MG tablet Take 2 tablets (40 mg total) by mouth 2 (two) times daily. 150 tablet 3  . cholecalciferol (VITAMIN D) 1000 UNITS tablet Take 2 tablets (2,000 Units total) by mouth daily. (Patient not taking: Reported on 09/02/2015)    . lisinopril (PRINIVIL,ZESTRIL) 2.5 MG tablet Take 1 tablet (2.5 mg total) by mouth 2 (two) times daily. 90 tablet 3  . Magnesium Oxide 400 (240 MG) MG TABS Take 400 mg by mouth daily. 30 tablet 3  . metolazone (ZAROXOLYN) 2.5 MG tablet Take 2.5 mg by mouth. As needed for 5 lb weight gain    . nitroGLYCERIN (NITROSTAT) 0.4 MG SL tablet Place 0.4 mg under the tongue every 5 (five) minutes as needed for chest pain.    Marland Kitchen ondansetron (ZOFRAN ODT) 4 MG disintegrating tablet Take 1 tablet (4 mg total) by mouth every 8 (eight) hours as needed for nausea or vomiting. (Patient not taking: Reported on 09/02/2015) 10 tablet 0   No current facility-administered medications for this encounter.    There were no vitals filed for this visit.  PHYSICAL EXAM: General:  Well appearing. No resp difficulty. In a wheelchair.  HEENT: normal Neck: supple. JVP not elevated. Carotids 2+ bilaterally; no bruits. No lymphadenopathy or thryomegaly appreciated. Cor: PMI normal. Mildly tachycardic, regular rate & rhythm. No rubs, gallops or murmurs. Lungs: CTAB Abdomen: soft, nontender, nondistended. No hepatosplenomegaly. No bruits or masses. Good bowel sounds. Extremities: no cyanosis, clubbing, rash.  Trace ankle edema.   Neuro: alert & orientedx3, cranial nerves grossly intact. Moves all 4 extremities w/o difficulty. Affect pleasant.  ASSESSMENT & PLAN: 1.  Chronic systolic CHF:  Ischemic cardiomyopathy, EF 19% by cardiac MRI in 9/16.  NYHA class II after recent admission with diuresis of about 39 lbs. She looks near-euvolemic. - Continue torsemide 40 mg bid.   - Stop HCTZ, start back on lisinopril at 2.5 mg bid.  -  Continue spironolactone 25 daily.  - Take Coreg at 3.125 mg bid dosing.  - BMET/BNP today and repeat BMET 2 wks at followup.  - Persistently low EF.  I will refer to EP for ICD.  Not CRT candidate given narrow QRS.  She is not sure she wants the ICD but wants to have the EP consultation.  2. CAD: s/p anterior MI,  PCI x2 LAD 04/2014.  History of stent thrombosis after missing Brilinta. No recent chest pain. She is allergic to ASA (angioedema-like reaction).  - She is taking atorvastatin.  Lipids/LFTs at followup.  -  Continue Brilinta 60 mg bid long-term.   [name] 09/02/2015

## 2015-09-04 ENCOUNTER — Telehealth (HOSPITAL_COMMUNITY): Payer: Self-pay | Admitting: Cardiology

## 2015-09-16 ENCOUNTER — Inpatient Hospital Stay (HOSPITAL_COMMUNITY): Admission: RE | Admit: 2015-09-16 | Payer: Self-pay | Source: Ambulatory Visit

## 2015-10-25 ENCOUNTER — Other Ambulatory Visit (HOSPITAL_COMMUNITY): Payer: Self-pay

## 2015-10-25 DIAGNOSIS — I509 Heart failure, unspecified: Secondary | ICD-10-CM

## 2015-10-25 MED ORDER — TORSEMIDE 20 MG PO TABS: 40.0000 mg | ORAL_TABLET | Freq: Two times a day (BID) | ORAL | Status: DC

## 2015-11-01 ENCOUNTER — Other Ambulatory Visit: Payer: Self-pay | Admitting: *Deleted

## 2015-11-01 ENCOUNTER — Telehealth: Payer: Self-pay | Admitting: Cardiology

## 2015-11-01 DIAGNOSIS — I509 Heart failure, unspecified: Secondary | ICD-10-CM

## 2015-11-01 NOTE — Telephone Encounter (Signed)
°*  STAT* If patient is at the pharmacy, call can be transferred to refill team.   1. Which medications need to be refilled? (please list name of each medication and dose if known) Furosemide 40mg (BID)  2. Which pharmacy/location (including street and city if local pharmacy) is medication to be sent to? [facility] on  3. Do they need a 30 day or 90 day supply? 30

## 2015-11-04 MED ORDER — TORSEMIDE 20 MG PO TABS: 40.0000 mg | ORAL_TABLET | Freq: Two times a day (BID) | ORAL | Status: DC

## 2016-02-20 ENCOUNTER — Encounter (HOSPITAL_COMMUNITY): Payer: Self-pay | Admitting: *Deleted

## 2016-02-20 ENCOUNTER — Emergency Department (HOSPITAL_COMMUNITY): Payer: BLUE CROSS/BLUE SHIELD

## 2016-02-20 ENCOUNTER — Inpatient Hospital Stay (HOSPITAL_COMMUNITY)
Admission: EM | Admit: 2016-02-20 | Discharge: 2016-02-25 | DRG: 291 | Disposition: A | Discharge status: Home / Self Care | Payer: BLUE CROSS/BLUE SHIELD | Attending: Cardiology | Admitting: Cardiology

## 2016-02-20 DIAGNOSIS — I130 Hypertensive heart and chronic kidney disease with heart failure and stage 1 through stage 4 chronic kidney disease, or unspecified chronic kidney disease: Principal | ICD-10-CM | POA: Diagnosis present

## 2016-02-20 DIAGNOSIS — A499 Bacterial infection, unspecified: Secondary | ICD-10-CM | POA: Diagnosis not present

## 2016-02-20 DIAGNOSIS — I509 Heart failure, unspecified: Secondary | ICD-10-CM

## 2016-02-20 DIAGNOSIS — N183 Chronic kidney disease, stage 3 (moderate): Secondary | ICD-10-CM | POA: Diagnosis present

## 2016-02-20 DIAGNOSIS — Z955 Presence of coronary angioplasty implant and graft: Secondary | ICD-10-CM | POA: Diagnosis not present

## 2016-02-20 DIAGNOSIS — I5022 Chronic systolic (congestive) heart failure: Secondary | ICD-10-CM | POA: Diagnosis present

## 2016-02-20 DIAGNOSIS — I2510 Atherosclerotic heart disease of native coronary artery without angina pectoris: Secondary | ICD-10-CM | POA: Diagnosis present

## 2016-02-20 DIAGNOSIS — Z7901 Long term (current) use of anticoagulants: Secondary | ICD-10-CM | POA: Diagnosis not present

## 2016-02-20 DIAGNOSIS — F17210 Nicotine dependence, cigarettes, uncomplicated: Secondary | ICD-10-CM | POA: Diagnosis present

## 2016-02-20 DIAGNOSIS — I252 Old myocardial infarction: Secondary | ICD-10-CM | POA: Diagnosis not present

## 2016-02-20 DIAGNOSIS — I255 Ischemic cardiomyopathy: Secondary | ICD-10-CM

## 2016-02-20 DIAGNOSIS — R188 Other ascites: Secondary | ICD-10-CM

## 2016-02-20 DIAGNOSIS — I5023 Acute on chronic systolic (congestive) heart failure: Secondary | ICD-10-CM | POA: Diagnosis present

## 2016-02-20 DIAGNOSIS — E785 Hyperlipidemia, unspecified: Secondary | ICD-10-CM | POA: Diagnosis present

## 2016-02-20 DIAGNOSIS — N390 Urinary tract infection, site not specified: Secondary | ICD-10-CM | POA: Diagnosis present

## 2016-02-20 DIAGNOSIS — I5041 Acute combined systolic (congestive) and diastolic (congestive) heart failure: Secondary | ICD-10-CM

## 2016-02-20 DIAGNOSIS — K219 Gastro-esophageal reflux disease without esophagitis: Secondary | ICD-10-CM | POA: Diagnosis present

## 2016-02-20 LAB — CBC
HCT: 42.5 % (ref 36.0–46.0)
Hemoglobin: 13.1 g/dL (ref 12.0–15.0)
MCH: 26.6 pg (ref 26.0–34.0)
MCHC: 30.8 g/dL (ref 30.0–36.0)
MCV: 86.2 fL (ref 78.0–100.0)
Platelets: 304 10*3/uL (ref 150–400)
RBC: 4.93 MIL/uL (ref 3.87–5.11)
RDW: 15 % (ref 11.5–15.5)
WBC: 8.4 10*3/uL (ref 4.0–10.5)

## 2016-02-20 LAB — URINE MICROSCOPIC-ADD ON

## 2016-02-20 LAB — COMPREHENSIVE METABOLIC PANEL
ALT: 16 U/L (ref 14–54)
AST: 26 U/L (ref 15–41)
Albumin: 3 g/dL — ABNORMAL LOW (ref 3.5–5.0)
Alkaline Phosphatase: 163 U/L — ABNORMAL HIGH (ref 38–126)
Anion gap: 11 (ref 5–15)
BUN: 18 mg/dL (ref 6–20)
CO2: 24 mmol/L (ref 22–32)
Calcium: 9.2 mg/dL (ref 8.9–10.3)
Chloride: 111 mmol/L (ref 101–111)
Creatinine, Ser: 1.56 mg/dL — ABNORMAL HIGH (ref 0.44–1.00)
GFR calc Af Amer: 39 mL/min — ABNORMAL LOW (ref >60)
GFR calc non Af Amer: 34 mL/min — ABNORMAL LOW (ref >60)
Glucose, Bld: 110 mg/dL — ABNORMAL HIGH (ref 65–99)
Potassium: 4.2 mmol/L (ref 3.5–5.1)
Sodium: 146 mmol/L — ABNORMAL HIGH (ref 135–145)
Total Bilirubin: 1.8 mg/dL — ABNORMAL HIGH (ref 0.3–1.2)
Total Protein: 6 g/dL — ABNORMAL LOW (ref 6.5–8.1)

## 2016-02-20 LAB — LIPASE, BLOOD: Lipase: 25 U/L (ref 11–51)

## 2016-02-20 LAB — I-STAT TROPONIN, ED: Troponin i, poc: 0 ng/mL (ref 0.00–0.08)

## 2016-02-20 LAB — BRAIN NATRIURETIC PEPTIDE: B Natriuretic Peptide: 1564.1 pg/mL — ABNORMAL HIGH (ref 0.0–100.0)

## 2016-02-20 LAB — URINALYSIS, ROUTINE W REFLEX MICROSCOPIC
Glucose, UA: NEGATIVE mg/dL
Hgb urine dipstick: NEGATIVE
Ketones, ur: NEGATIVE mg/dL
Nitrite: NEGATIVE
Protein, ur: 100 mg/dL — AB
Specific Gravity, Urine: 1.025 (ref 1.005–1.030)
pH: 5 (ref 5.0–8.0)

## 2016-02-20 MED ORDER — CARVEDILOL 3.125 MG PO TABS
3.1250 mg | ORAL_TABLET | Freq: Two times a day (BID) | ORAL | Status: DC
  Administered 2016-02-20 – 2016-02-25 (×9): 3.125 mg via ORAL
  Filled 2016-02-20 (×10): qty 1

## 2016-02-20 MED ORDER — LISINOPRIL 2.5 MG PO TABS: 2.5000 mg | ORAL_TABLET | Freq: Every day | ORAL | Status: DC

## 2016-02-20 MED ORDER — ENOXAPARIN SODIUM 40 MG/0.4ML ~~LOC~~ SOLN
40.0000 mg | SUBCUTANEOUS | Status: DC
  Administered 2016-02-20 – 2016-02-24 (×5): 40 mg via SUBCUTANEOUS
  Filled 2016-02-20 (×5): qty 0.4

## 2016-02-20 MED ORDER — POTASSIUM CHLORIDE CRYS ER 20 MEQ PO TBCR
40.0000 meq | EXTENDED_RELEASE_TABLET | Freq: Every day | ORAL | Status: DC
  Administered 2016-02-21 – 2016-02-25 (×5): 40 meq via ORAL
  Filled 2016-02-20 (×5): qty 2

## 2016-02-20 MED ORDER — TICAGRELOR 60 MG PO TABS
60.0000 mg | ORAL_TABLET | Freq: Two times a day (BID) | ORAL | Status: DC
  Administered 2016-02-20 – 2016-02-25 (×10): 60 mg via ORAL
  Filled 2016-02-20 (×10): qty 1

## 2016-02-20 MED ORDER — SODIUM CHLORIDE 0.9 % IV SOLN
250.0000 mL | INTRAVENOUS | Status: DC | PRN
Start: 2016-02-20 — End: 2016-02-25

## 2016-02-20 MED ORDER — ONDANSETRON 4 MG PO TBDP
4.0000 mg | ORAL_TABLET | Freq: Once | ORAL | Status: AC | PRN
  Administered 2016-02-20: 4 mg via ORAL

## 2016-02-20 MED ORDER — ATORVASTATIN CALCIUM 40 MG PO TABS
40.0000 mg | ORAL_TABLET | Freq: Every day | ORAL | Status: DC
  Administered 2016-02-21 – 2016-02-24 (×4): 40 mg via ORAL
  Filled 2016-02-20 (×5): qty 1

## 2016-02-20 MED ORDER — SPIRONOLACTONE 25 MG PO TABS: 25.0000 mg | ORAL_TABLET | Freq: Every day | ORAL | Status: DC

## 2016-02-20 MED ORDER — SPIRONOLACTONE 25 MG PO TABS
25.0000 mg | ORAL_TABLET | Freq: Every day | ORAL | Status: DC
  Administered 2016-02-20 – 2016-02-25 (×6): 25 mg via ORAL
  Filled 2016-02-20 (×6): qty 1

## 2016-02-20 MED ORDER — ACETAMINOPHEN 325 MG PO TABS
650.0000 mg | ORAL_TABLET | ORAL | Status: DC | PRN
  Administered 2016-02-21: 650 mg via ORAL
  Filled 2016-02-20: qty 2

## 2016-02-20 MED ORDER — VITAMIN D 1000 UNITS PO TABS
2000.0000 [IU] | ORAL_TABLET | Freq: Every day | ORAL | Status: DC
  Administered 2016-02-20 – 2016-02-25 (×6): 2000 [IU] via ORAL
  Filled 2016-02-20 (×6): qty 2

## 2016-02-20 MED ORDER — FUROSEMIDE 10 MG/ML IJ SOLN
80.0000 mg | Freq: Two times a day (BID) | INTRAMUSCULAR | Status: DC
  Administered 2016-02-21 – 2016-02-24 (×8): 80 mg via INTRAVENOUS
  Filled 2016-02-20 (×8): qty 8

## 2016-02-20 MED ORDER — CIPROFLOXACIN HCL 500 MG PO TABS
500.0000 mg | ORAL_TABLET | Freq: Two times a day (BID) | ORAL | Status: DC
  Administered 2016-02-20 – 2016-02-23 (×7): 500 mg via ORAL
  Filled 2016-02-20 (×9): qty 1

## 2016-02-20 MED ORDER — ONDANSETRON 4 MG PO TBDP
ORAL_TABLET | ORAL | Status: AC
  Filled 2016-02-20: qty 1

## 2016-02-20 MED ORDER — LISINOPRIL 2.5 MG PO TABS
2.5000 mg | ORAL_TABLET | Freq: Two times a day (BID) | ORAL | Status: DC
  Administered 2016-02-20 – 2016-02-24 (×8): 2.5 mg via ORAL
  Filled 2016-02-20 (×11): qty 1

## 2016-02-20 MED ORDER — CARVEDILOL 3.125 MG PO TABS: 3.1250 mg | ORAL_TABLET | Freq: Two times a day (BID) | ORAL | Status: DC

## 2016-02-20 MED ORDER — FUROSEMIDE 10 MG/ML IJ SOLN
80.0000 mg | Freq: Once | INTRAMUSCULAR | Status: AC
  Administered 2016-02-20: 80 mg via INTRAVENOUS
  Filled 2016-02-20: qty 8

## 2016-02-20 MED ORDER — SODIUM CHLORIDE 0.9% FLUSH
3.0000 mL | Freq: Two times a day (BID) | INTRAVENOUS | Status: DC
  Administered 2016-02-20 – 2016-02-24 (×9): 3 mL via INTRAVENOUS

## 2016-02-20 MED ORDER — SODIUM CHLORIDE 0.9% FLUSH: 3.0000 mL | INTRAVENOUS | Status: DC | PRN

## 2016-02-20 MED ORDER — ONDANSETRON HCL 4 MG/2ML IJ SOLN: 4.0000 mg | Freq: Four times a day (QID) | INTRAMUSCULAR | Status: DC | PRN

## 2016-02-20 MED ORDER — PNEUMOCOCCAL VAC POLYVALENT 25 MCG/0.5ML IJ INJ
0.5000 mL | INJECTION | INTRAMUSCULAR | Status: AC
  Administered 2016-02-22: 0.5 mL via INTRAMUSCULAR
  Filled 2016-02-20: qty 0.5

## 2016-02-20 NOTE — H&P (Signed)
Advanced Heart Failure Team History and Physical Note   Primary Physician: Primary Cardiologist:    Reason for Admission: Acute/Chronic Systolic Heart Faiure    HPI:   Deborah Ho is a 65 y.o. female with a history of HTN, HLD, tobacco abuse, CAD s/panterior STEMI with early stent thrombosis for missing doses of Brilinta s/p PCI x 2 into LAD (04/2014) and chronic systolic HF with EF 20% by 3/16 echo. 07/2015 C-MRI 19% LAD territory scare, no thrombus.  Last seen in the HF clinic 08/2016 and appeared stable. She was to continue on torsemide 40 mg twice a day. Weight at that time was 183 pounds.   Today she presented to [facility] ED with increased abdominal distension and leg edema. Says she has only been taking torsemide 40 mg once a day instead of twice a day. Admitting labs included troponin 0.00, K 4.2, Creatinine 1.56, [id number], and Hgb 13. CXR concerning for pulmonary vascular congestion. Given 80 mg IV lasix in the ED.   SOB at rest and with exertion. Denies CP.     Review of Systems: [y] = yes, [ ]  = no   General: Weight gain [Y ]; Weight loss [ ] ; Anorexia [ ] ; Fatigue [Y ]; Fever [ ] ; Chills [ ] ; Weakness [Y ]  Cardiac: Chest pain/pressure [ ] ; Resting SOB [ Y]; Exertional SOB [Y ]; Orthopnea [Y ]; Pedal Edema [Y ]; Palpitations [ ] ; Syncope [ ] ; Presyncope [ ] ; Paroxysmal nocturnal dyspnea[ ]   Pulmonary: Cough [ ] ; Wheezing[ ] ; Hemoptysis[ ] ; Sputum [ ] ; Snoring [ ]   GI: Vomiting[ ] ; Dysphagia[ ] ; Melena[ ] ; Hematochezia [ ] ; Heartburn[ ] ; Abdominal pain [ ] ; Constipation [ ] ; Diarrhea [ ] ; BRBPR [ ]   GU: Hematuria[ ] ; Dysuria [ ] ; Nocturia[ ]   Vascular: Pain in legs with walking [ ] ; Pain in feet with lying flat [ ] ; Non-healing sores [ ] ; Stroke [ ] ; TIA [ ] ; Slurred speech [ ] ;  Neuro: Headaches[ ] ; Vertigo[ ] ; Seizures[ ] ; Paresthesias[ ] ;Blurred vision [ ] ; Diplopia [ ] ; Vision changes [ ]   Ortho/Skin: Arthritis [ ] ; Joint pain [Y ]; Muscle pain [ ] ; Joint swelling [ ] ; Back  Pain [ ] ; Rash [ ]   Psych: Depression[Y ]; Anxiety[ ]   Heme: Bleeding problems [ ] ; Clotting disorders [ ] ; Anemia [ ]   Endocrine: Diabetes [ ] ; Thyroid dysfunction[ ]   Home Medications Prior to Admission medications   Medication Sig Start Date End Date Taking? Authorizing Provider  atorvastatin (LIPITOR) 80 MG tablet Take 0.5 tablets (40 mg total) by mouth daily at 6 PM. 08/19/15  Yes , PA-C  carvedilol (COREG) 3.125 MG tablet Take 1 tablet (3.125 mg total) by mouth 2 (two) times daily with a meal. 08/19/15  Yes , PA-C  cetirizine (ZYRTEC) 10 MG tablet Take 10 mg by mouth daily as needed for allergies.   Yes Historical Provider, MD  Cholecalciferol (VITAMIN D-3 PO) Take 1 capsule by mouth every 21 ( twenty-one) days.   Yes Historical Provider, MD  famotidine (PEPCID AC) 10 MG chewable tablet Chew 10 mg by mouth 2 (two) times daily as needed for heartburn.   Yes Historical Provider, MD  furosemide (LASIX) 40 MG tablet Take 40 mg by mouth.   Yes Historical Provider, MD  lisinopril (PRINIVIL,ZESTRIL) 2.5 MG tablet Take 1 tablet (2.5 mg total) by mouth 2 (two) times daily. 09/02/15  Yes , MD  metolazone (ZAROXOLYN) 2.5 MG tablet Take 2.5  mg by mouth. As needed for 5 lb weight gain   Yes Historical Provider, MD  nitroGLYCERIN (NITROSTAT) 0.4 MG SL tablet Place 0.4 mg under the tongue every 5 (five) minutes as needed for chest pain.   Yes Historical Provider, MD  ondansetron (ZOFRAN ODT) 4 MG disintegrating tablet Take 1 tablet (4 mg total) by mouth every 8 (eight) hours as needed for nausea or vomiting. 07/25/15  Yes Angelina Ok, MD  potassium chloride SA (K-DUR,KLOR-CON) 20 MEQ tablet Take 20-40 mEq by mouth 2 (two) times daily. Take 40 mEq (2 tablets) in the morning and 20 mEq (1 tablet) in the afternoon   Yes Historical Provider, MD  cholecalciferol (VITAMIN D) 1000 UNITS tablet Take 2 tablets (2,000 Units total) by mouth daily. Patient not  taking: Reported on 09/02/2015 08/19/15   Graciella Freer, PA-C  Magnesium Oxide 400 (240 MG) MG TABS Take 400 mg by mouth daily. 09/02/15   Laurey Morale, MD  spironolactone (ALDACTONE) 25 MG tablet Take 1 tablet (25 mg total) by mouth daily. 08/19/15   Graciella Freer, PA-C  ticagrelor (BRILINTA) 60 MG TABS tablet Take 1 tablet (60 mg total) by mouth 2 (two) times daily. 08/19/15   Graciella Freer, PA-C  torsemide (DEMADEX) 20 MG tablet Take 2 tablets (40 mg total) by mouth 2 (two) times daily. 11/04/15   Peter M Swaziland, MD    Past Medical History: Past Medical History  Diagnosis Date  . GERD (gastroesophageal reflux disease)     Probable  . CAD (coronary artery disease) 05/03/14; 05/09/14    a. anterior STEMI with early in-stent thorombosis for missed dose of Brillinta s/p PCI with DESx 2 into LAD (04/2014)  . HTN (hypertension)   . Hyperlipidemia   . Tobacco use   . Non compliance w medication regimen   . Ischemic cardiomyopathy     a. 04/2014 ECHO with EF 45-50% b.  Repeat 2D echo 08/14/14 with EF down at 15%. Life vest placed    Past Surgical History: Past Surgical History  Procedure Laterality Date  . Partial hysterectomy    . Cholecystectomy    . Coronary angioplasty with stent placement  05/03/14    STEMI- stent to LAD DES- Xience alpine  . Coronary angioplasty with stent placement  05/09/14    STEMI- overlapping stent to LAD, pt had missed a dose of Brilinta  . Left heart catheterization with coronary angiogram N/A 05/03/2014    Procedure: LEFT HEART CATHETERIZATION WITH CORONARY ANGIOGRAM;  Surgeon: Peter M Swaziland, MD;  Location: [facility] CATH LAB;  Service: Cardiovascular;  Laterality: N/A;  . Percutaneous stent intervention  05/03/2014    Procedure: PERCUTANEOUS STENT INTERVENTION;  Surgeon: Peter M Swaziland, MD;  Location: [facility] CATH LAB;  Service: Cardiovascular;;  DES Prox LAD   . Left heart catheterization with coronary angiogram N/A 05/09/2014    Procedure: LEFT  HEART CATHETERIZATION WITH CORONARY ANGIOGRAM;  Surgeon: Corky Crafts, MD;  Location: [facility] CATH LAB;  Service: Cardiovascular;  Laterality: N/A;    Family History: Family History  Problem Relation Age of Onset  . Diabetes Mother   . Hypertension Mother     Social History: Social History   Social History  . Marital Status: Widowed    Spouse Name: N/A  . Number of Children: N/A  . Years of Education: N/A   Occupational History  . not employed    Social History Main Topics  . Smoking status: Current Some Day Smoker -- 0.10  packs/day for .5 years    Types: Cigarettes  . Smokeless tobacco: Current User     Comment: down to 3 cigarettes daily (08/03/14)  . Alcohol Use: No  . Drug Use: No  . Sexual Activity: Not Asked   Other Topics Concern  . None   Social History Narrative   Patient has 6 brothers and sisters and none have known coronary artery disease. She lives alone in [place], but has several brothers in the area.    Allergies:  Allergies  Allergen Reactions  . Aspirin Swelling    Chewable children's aspirin makes patients tongue and face swell  . Effient [Prasugrel] Swelling    Patient's tongue and face swells  . Lactose Intolerance (Gi) Other (See Comments)    REACTION: stomach upset  . Robitussin Dm [Guaifenesin-Dm] Swelling    Patient's tongue swells  . Sulfa Antibiotics Swelling  . Wheat Bran Other (See Comments)    REACTION: unknown    Objective:    Vital Signs:   Temp:  [97.8 F (36.6 C)] 97.8 F (36.6 C) (04/06 1051) Pulse Rate:  [89-98] 92 (04/06 1456) Resp:  [18-20] 18 (04/06 1456) BP: (131-147)/(96-105) 131/96 mmHg (04/06 1456) SpO2:  [97 %-99 %] 99 % (04/06 1456) Weight:  [229 lb 6 oz (104.044 kg)] 229 lb 6 oz (104.044 kg) (04/06 1051)   Filed Weights   02/20/16 1051  Weight: 229 lb 6 oz (104.044 kg)    Physical Exam: General: No resp difficulty HEENT: normal Neck: supple. JVP to ear  . Carotids 2+ bilat; no bruits. No  lymphadenopathy or thryomegaly appreciated. Cor: PMI nondisplaced. Regular rate & rhythm. No rubs, gallops or murmurs. Lungs: clear Abdomen: soft, nontender, +++distended. No hepatosplenomegaly. No bruits or masses. Good bowel sounds. Extremities: no cyanosis, clubbing, rash, R and LLE 3+ edema Neuro: alert & orientedx3, cranial nerves grossly intact. moves all 4 extremities w/o difficulty. Affect pleasant  Telemetry:  Labs: Basic Metabolic Panel:  Recent Labs Lab 02/20/16 1124  NA 146*  K 4.2  CL 111  CO2 24  GLUCOSE 110*  BUN 18  CREATININE 1.56*  CALCIUM 9.2    Liver Function Tests:  Recent Labs Lab 02/20/16 1124  AST 26  ALT 16  ALKPHOS 163*  BILITOT 1.8*  PROT 6.0*  ALBUMIN 3.0*    Recent Labs Lab 02/20/16 1124  LIPASE 25   No results for input(s): AMMONIA in the last 168 hours.  CBC:  Recent Labs Lab 02/20/16 1124  WBC 8.4  HGB 13.1  HCT 42.5  MCV 86.2  PLT 304    Cardiac Enzymes: No results for input(s): CKTOTAL, CKMB, CKMBINDEX, TROPONINI in the last 168 hours.  BNP: BNP (last 3 results)  Recent Labs  08/12/15 1703 09/02/15 1000 02/20/16 1124  BNP 1799.8* 263.3* 1564.1*    ProBNP (last 3 results) No results for input(s): PROBNP in the last 8760 hours.   CBG: No results for input(s): GLUCAP in the last 168 hours.  Coagulation Studies: No results for input(s): LABPROT, INR in the last 72 hours.  Other results: EKG: Imaging: Dg Chest 2 View  02/20/2016  CLINICAL DATA:  Cough and body swelling and facial swelling for 3 days, history GERD, coronary artery disease post MI, ischemic cardiomyopathy, hypertension, hyperlipidemia, CHF, smoking EXAM: CHEST  2 VIEW COMPARISON:  08/12/2015, 01/29/2015 FINDINGS: Enlargement of cardiac silhouette with pulmonary vascular congestion. Minimal prominence of RIGHT hilum unchanged since 01/29/2015. Minimal RIGHT basilar atelectasis though improved since previous exams. No new infiltrate,  pleural  effusion or pneumothorax. Bones unremarkable. IMPRESSION: Improved aeration RIGHT base. Enlargement of cardiac silhouette with pulmonary vascular congestion. No acute abnormalities. Electronically Signed   By: Ulyses Southward M.D.   On: 02/20/2016 13:56          Assessment/Plan/Discussion:    1. Acute/Chronic Systolic Heart Failure- ICM .CMRI 07/2015 EF ~19%. No thrombus noted. In the past she has been referred to EP but no showed.  Presented to [facility] ED with increased abdominal distension and leg edema. NYHA III-IV. CXR with pulmonary congestion. Marked volume overload not surprising as she has not been taking lasix twice a day. Admit and diuresed with 80 mg IV lasix twice a day. Continue carvedilol 3.125 mg twice a day, lisinopril 2.5 mg bid, and spiro 25 mg daily.   Check ECHO.  2. HTN- Elevated but as above add back her home meds 3. CAD: s/p anterior MI with history of stent thrombosis after missing Brilinta. Continue Brilinta 60 mg bid + statin. Angioedema with aspirin. No chest pain. Troponin   0.00 4. Ascites - Set up paracentesis.  5. UTI- UA + Needs urine culture.  6. CKD- Stage III Creatinine 1.5--1.7   Admit to telemetry and diurese with IV lasix.    Length of Stay: 0   Amy Clegg NP-C  02/20/2016, 4:03 PM  Advanced Heart Failure Team Pager [phone] (M-F; 7a - 4p)  Please contact [facility] for night-coverage after hours (4p -7a ) and weekends on [email or web address]  Patient seen with NP, agree with the above notel.    65 yo with history of CAD, CKD, ischemic cardiomyopathy, and noncompliance presented with acute on chronic systolic CHF.  1. Acute on chronic systolic CHF: EF 17% on cMRI in 9/16.  Never went to EP evaluation for ICD.  History of noncompliance, only taking torsemide 40 mg once daily rather than twice daily.  Marked volume overload today with suspected ascites.   - Lasix 80 mg IV bid.  - lisinopril 2.5 mg bid, Coreg 3.125 mg bid, spironolactone 25 daily.  - Abdominal  US tomorrow, therapeutic paracentesis if significant ascites.  - Echo - Narrow QRS, not CRT candidate.  2. CAD: No chest pain, continue Brilinta and statin.  3. CKD: Stage III.  Follow closely with diuresis.   [name] 02/20/2016 5:02 PM

## 2016-02-20 NOTE — ED Notes (Signed)
Attempted report 

## 2016-02-20 NOTE — ED Provider Notes (Signed)
CSN: [id number]     Arrival date & time 02/20/16  1044 History   First MD Initiated Contact with Patient 02/20/16 1249     Chief Complaint  Patient presents with  . Abdominal Pain  . Nausea   (Consider location/radiation/quality/duration/timing/severity/associated sxs/prior Treatment) Patient is a 65 y.o. female presenting with abdominal pain. The history is provided by the patient. No language interpreter was used.  Abdominal Pain Deborah Ho is a 64 year old female with a history of CAD: s/p anterior MI, PCI x2 LAD 04/2014, hypertension, hyperlipidemia, CHF and ischemic cardiomyopathy who presents with gradual onset and worsening abdominal pain and swelling 1 week. She states she is supposed to be on 40 mg of Lasix twice a day but only takes it once a day. She also reports intermittent nausea and vomiting since December 2016. She denies any treatment at home for this. She is also complaining of a cough with white sputum. She also reports a 25 pound weight gain in the last week. Her abdominal surgeries include cholecystectomy and partial hysterectomy. She has an every day smoker. She denies any fever, chills, night sweats, chest pain, shortness of breath, diarrhea, or urinary symptoms.   Past Medical History  Diagnosis Date  . GERD (gastroesophageal reflux disease)     Probable  . CAD (coronary artery disease) 05/03/14; 05/09/14    a. anterior STEMI with early in-stent thorombosis for missed dose of Brillinta s/p PCI with DESx 2 into LAD (04/2014)  . HTN (hypertension)   . Hyperlipidemia   . Tobacco use   . Non compliance w medication regimen   . Ischemic cardiomyopathy     a. 04/2014 ECHO with EF 45-50% b.  Repeat 2D echo 08/14/14 with EF down at 15%. Life vest placed   Past Surgical History  Procedure Laterality Date  . Partial hysterectomy    . Cholecystectomy    . Coronary angioplasty with stent placement  05/03/14    STEMI- stent to LAD DES- Xience alpine  . Coronary angioplasty  with stent placement  05/09/14    STEMI- overlapping stent to LAD, pt had missed a dose of Brilinta  . Left heart catheterization with coronary angiogram N/A 05/03/2014    Procedure: LEFT HEART CATHETERIZATION WITH CORONARY ANGIOGRAM;  Surgeon: Peter M Swaziland, MD;  Location: [facility] CATH LAB;  Service: Cardiovascular;  Laterality: N/A;  . Percutaneous stent intervention  05/03/2014    Procedure: PERCUTANEOUS STENT INTERVENTION;  Surgeon: Peter M Swaziland, MD;  Location: [facility] CATH LAB;  Service: Cardiovascular;;  DES Prox LAD   . Left heart catheterization with coronary angiogram N/A 05/09/2014    Procedure: LEFT HEART CATHETERIZATION WITH CORONARY ANGIOGRAM;  Surgeon: Corky Crafts, MD;  Location: [facility] CATH LAB;  Service: Cardiovascular;  Laterality: N/A;   Family History  Problem Relation Age of Onset  . Diabetes Mother   . Hypertension Mother    Social History  Substance Use Topics  . Smoking status: Current Some Day Smoker -- 0.10 packs/day for .5 years    Types: Cigarettes  . Smokeless tobacco: Current User     Comment: down to 3 cigarettes daily (08/03/14)  . Alcohol Use: No   OB History    No data available     Review of Systems  Gastrointestinal: Positive for abdominal pain.  All other systems reviewed and are negative.     Allergies  Aspirin; Effient; Lactose intolerance (gi); Robitussin dm; Sulfa antibiotics; and Wheat bran  Home Medications   Prior to Admission medications  Medication Sig Start Date End Date Taking? Authorizing Provider  atorvastatin (LIPITOR) 80 MG tablet Take 0.5 tablets (40 mg total) by mouth daily at 6 PM. 08/19/15   Graciella Freer, PA-C  carvedilol (COREG) 3.125 MG tablet Take 1 tablet (3.125 mg total) by mouth 2 (two) times daily with a meal. 08/19/15   Graciella Freer, PA-C  cetirizine (ZYRTEC) 10 MG tablet Take 10 mg by mouth daily as needed for allergies.    Historical Provider, MD  cholecalciferol (VITAMIN D) 1000 UNITS tablet Take  2 tablets (2,000 Units total) by mouth daily. Patient not taking: Reported on 09/02/2015 08/19/15   Graciella Freer, PA-C  famotidine (PEPCID AC) 10 MG chewable tablet Chew 10 mg by mouth 2 (two) times daily as needed for heartburn.    Historical Provider, MD  lisinopril (PRINIVIL,ZESTRIL) 2.5 MG tablet Take 1 tablet (2.5 mg total) by mouth 2 (two) times daily. 09/02/15   Laurey Morale, MD  Magnesium Oxide 400 (240 MG) MG TABS Take 400 mg by mouth daily. 09/02/15   Laurey Morale, MD  metolazone (ZAROXOLYN) 2.5 MG tablet Take 2.5 mg by mouth. As needed for 5 lb weight gain    Historical Provider, MD  nitroGLYCERIN (NITROSTAT) 0.4 MG SL tablet Place 0.4 mg under the tongue every 5 (five) minutes as needed for chest pain.    Historical Provider, MD  ondansetron (ZOFRAN ODT) 4 MG disintegrating tablet Take 1 tablet (4 mg total) by mouth every 8 (eight) hours as needed for nausea or vomiting. Patient not taking: Reported on 09/02/2015 07/25/15   Angelina Ok, MD  potassium chloride SA (K-DUR,KLOR-CON) 20 MEQ tablet Take 20-40 mEq by mouth 2 (two) times daily. Take 40 mEq (2 tablets) in the morning and 20 mEq (1 tablet) in the afternoon    Historical Provider, MD  spironolactone (ALDACTONE) 25 MG tablet Take 1 tablet (25 mg total) by mouth daily. 08/19/15   Graciella Freer, PA-C  ticagrelor (BRILINTA) 60 MG TABS tablet Take 1 tablet (60 mg total) by mouth 2 (two) times daily. 08/19/15   Graciella Freer, PA-C  torsemide (DEMADEX) 20 MG tablet Take 2 tablets (40 mg total) by mouth 2 (two) times daily. 11/04/15   Peter M Swazil[place], MD   BP 131/96 mmHg  Pulse 92  Temp(Src) 97.8 F (36.6 C) (Oral)  Resp 18  Wt 104.044 kg  SpO2 99% Physical Exam  Constitutional: She is oriented to person, place, and time. She appears well-developed and well-nourished.  HENT:  Head: Normocephalic and atraumatic.  Eyes: Conjunctivae are normal.  Neck: Normal range of motion. Neck supple.   Cardiovascular: Normal rate, regular rhythm and normal heart sounds.   Regular rate and rhythm.  Pulmonary/Chest: Effort normal.  Lungs clear to auscultation bilaterally. No shortness of breath or respiratory distress. 99% oxygen on room air at bedside.  Coughing on exam.  Abdominal: Soft. There is no tenderness.  Abdominal distention.  Musculoskeletal: Normal range of motion.  No lower extremity edema. Distal pulses intact.  Neurological: She is alert and oriented to person, place, and time.  Skin: Skin is warm and dry.  Nursing note and vitals reviewed.   ED Course  Procedures (including critical care time) Labs Review Labs Reviewed  COMPREHENSIVE METABOLIC PANEL - Abnormal; Notable for the following:    Sodium 146 (*)    Glucose, Bld 110 (*)    Creatinine, Ser 1.56 (*)    Total Protein 6.0 (*)    Albumin  3.0 (*)    Alkaline Phosphatase 163 (*)    Total Bilirubin 1.8 (*)    GFR calc non Af Amer 34 (*)    GFR calc Af Amer 39 (*)    All other components within normal limits  URINALYSIS, ROUTINE W REFLEX MICROSCOPIC (NOT AT [facility]) - Abnormal; Notable for the following:    Color, Urine AMBER (*)    APPearance CLOUDY (*)    Bilirubin Urine SMALL (*)    Protein, ur 100 (*)    Leukocytes, UA SMALL (*)    All other components within normal limits  BRAIN NATRIURETIC PEPTIDE - Abnormal; Notable for the following:    B Natriuretic Peptide 1564.1 (*)    All other components within normal limits  URINE MICROSCOPIC-ADD ON - Abnormal; Notable for the following:    Squamous Epithelial / LPF 6-30 (*)    Bacteria, UA MANY (*)    All other components within normal limits  URINE CULTURE  LIPASE, BLOOD  CBC  I-STAT TROPOININ, ED    Imaging Review Dg Chest 2 View  02/20/2016  CLINICAL DATA:  Cough and body swelling and facial swelling for 3 days, history GERD, coronary artery disease post MI, ischemic cardiomyopathy, hypertension, hyperlipidemia, CHF, smoking EXAM: CHEST  2 VIEW  COMPARISON:  08/12/2015, 01/29/2015 FINDINGS: Enlargement of cardiac silhouette with pulmonary vascular congestion. Minimal prominence of RIGHT hilum unchanged since 01/29/2015. Minimal RIGHT basilar atelectasis though improved since previous exams. No new infiltrate, pleural effusion or pneumothorax. Bones unremarkable. IMPRESSION: Improved aeration RIGHT base. Enlargement of cardiac silhouette with pulmonary vascular congestion. No acute abnormalities. Electronically Signed   By: Ulyses Southward M.D.   On: 02/20/2016 13:56   I have personally reviewed and evaluated these images and lab results as part of my medical decision-making.   EKG Interpretation   Date/Time:  Thursday February 20 2016 15:11:43 EDT Ventricular Rate:  88 PR Interval:  177 QRS Duration: 96 QT Interval:  407 QTC Calculation: 492 R Axis:   121 Text Interpretation:  Ventricular-paced complexes No further rhythm  analysis attempted due to paced rhythm Anterior infarct, old Baseline  wander in lead(s) II III aVR aVL aVF V1 V3 V4 V5 No significant change  since last tracing no pacemeker Reconfirmed by ZACKOWSKI  MD, SCOTT  [phone]) on 02/20/2016 3:15:40 PM      MDM   Final diagnoses:  Acute combined systolic and diastolic congestive heart failure (HCC)  Patient's main complaints today are abdominal distention and pain x 1 week with 25 pound weight gain and with N/V for the past 4 months. Per patient's records she had an echocardiogram done one year ago which showed a 20% ejection fraction. I could not find a recent weight to compare to her weight today. BNP is 1564. Troponin is negative. EKG comparable to previous. Chest x-ray shows pulmonary vascular congestion. No pneumonia or pneumothorax. Patient most likely in fluid overload with abdominal distention. I do not believe this is a surgical abdomen. No signs of obstruction. She was started on IV Lasix. She is stable. Medications  ondansetron (ZOFRAN-ODT) 4 MG disintegrating  tablet (not administered)  ondansetron (ZOFRAN-ODT) disintegrating tablet 4 mg (4 mg Oral Given 02/20/16 1127)  furosemide (LASIX) injection 80 mg (80 mg Intravenous Given 02/20/16 1455)   Filed Vitals:   02/20/16 1400 02/20/16 1456  BP: 147/98 131/96  Pulse: 90 92  Temp:    Resp: 18 18   Patient was seen by nurse practitioner working with cardiology. She will  admit to cardiology service.    Catha Gosselin, PA-C 02/20/16 1529  Vanetta Mulders, MD 02/22/16 [phone]

## 2016-02-20 NOTE — ED Notes (Signed)
Pt reports fluid retention of the abd & RLQ pain onset x 6 days ago, pt takes Lasix 40 mg, pt reports intermittent vomiting onset since Dec 2016, pt c/o nausea, denies diarrhea, pt c/o productive cough with white sputum, pt A&O x4, follows commands, speaks in complete sentences

## 2016-02-21 ENCOUNTER — Inpatient Hospital Stay (HOSPITAL_COMMUNITY): Payer: BLUE CROSS/BLUE SHIELD

## 2016-02-21 DIAGNOSIS — A499 Bacterial infection, unspecified: Secondary | ICD-10-CM

## 2016-02-21 DIAGNOSIS — I509 Heart failure, unspecified: Secondary | ICD-10-CM

## 2016-02-21 DIAGNOSIS — N390 Urinary tract infection, site not specified: Secondary | ICD-10-CM

## 2016-02-21 LAB — BASIC METABOLIC PANEL
Anion gap: 14 (ref 5–15)
BUN: 20 mg/dL (ref 6–20)
CO2: 23 mmol/L (ref 22–32)
Calcium: 9.2 mg/dL (ref 8.9–10.3)
Chloride: 105 mmol/L (ref 101–111)
Creatinine, Ser: 1.52 mg/dL — ABNORMAL HIGH (ref 0.44–1.00)
GFR calc Af Amer: 41 mL/min — ABNORMAL LOW (ref >60)
GFR calc non Af Amer: 35 mL/min — ABNORMAL LOW (ref >60)
Glucose, Bld: 108 mg/dL — ABNORMAL HIGH (ref 65–99)
Potassium: 3.8 mmol/L (ref 3.5–5.1)
Sodium: 142 mmol/L (ref 135–145)

## 2016-02-21 LAB — URINE CULTURE: Culture: 8000 — AB

## 2016-02-21 LAB — ECHOCARDIOGRAM COMPLETE
Height: 65 in
Weight: 3590.85 oz

## 2016-02-21 MED ORDER — METOLAZONE 5 MG PO TABS
5.0000 mg | ORAL_TABLET | Freq: Every day | ORAL | Status: DC
  Administered 2016-02-21 – 2016-02-24 (×4): 5 mg via ORAL
  Filled 2016-02-21 (×4): qty 1

## 2016-02-21 MED ORDER — BENZONATATE 100 MG PO CAPS
100.0000 mg | ORAL_CAPSULE | Freq: Three times a day (TID) | ORAL | Status: DC | PRN
  Administered 2016-02-21 – 2016-02-23 (×4): 100 mg via ORAL
  Filled 2016-02-21 (×4): qty 1

## 2016-02-21 MED ORDER — ISOSORBIDE MONONITRATE ER 30 MG PO TB24
30.0000 mg | ORAL_TABLET | Freq: Every day | ORAL | Status: DC
  Administered 2016-02-21 – 2016-02-25 (×5): 30 mg via ORAL
  Filled 2016-02-21 (×5): qty 1

## 2016-02-21 MED ORDER — HYDRALAZINE HCL 25 MG PO TABS
25.0000 mg | ORAL_TABLET | Freq: Three times a day (TID) | ORAL | Status: DC
  Administered 2016-02-21 – 2016-02-24 (×9): 25 mg via ORAL
  Filled 2016-02-21 (×12): qty 1

## 2016-02-21 NOTE — Progress Notes (Signed)
    Expand All Collapse All   Case Management Note  Patient Details  Name: Deborah Ho MRN: [id number] Date of Birth: 1951-02-13  Subjective/Objective: CHF    Action/Plan: Patient known to me from previous admission. Patient can greatly benefit from [facility] at discharge for ongoing teaching/education. HHC choice offered, patient chose [facility] for [facility] services for Disease Management Program for CHF. Lupita Leash with [facility] called for arrangements.   Expected Discharge Date:  possibly 4/ 08/2016   Expected Discharge Plan: Home w Home Health Services   Discharge planning Services CM Consult  Choice offered to: Patient  HH Arranged: RN, Disease Management HH Agency: [facility]  Status of Service: In process, will continue to follow  [name] [phone] 02/21/2016, 1:17 PM

## 2016-02-21 NOTE — Progress Notes (Signed)
  Echocardiogram 2D Echocardiogram has been performed.  Deborah Ho 02/21/2016, 11:20 AM

## 2016-02-21 NOTE — Progress Notes (Signed)
Copy of insurance information is placed on the front of the patient's shadow chart. At discharge please give the insurance information along with discharge information. [name] [facility] [phone]

## 2016-02-21 NOTE — Progress Notes (Signed)
Advanced Heart Failure Rounding Note   Subjective:    Admitted with volume overload. Started on lasix 80 mg IV twice a day.   Overall feeling a little better. Mild dyspnea with exertion.     Objective:   Weight Range:  Vital Signs:   Temp:  [97.5 F (36.4 C)-98.2 F (36.8 C)] 97.5 F (36.4 C) (04/07 0744) Pulse Rate:  [86-98] 86 (04/07 0744) Resp:  [18-21] 18 (04/07 0744) BP: (127-163)/(70-105) 163/92 mmHg (04/07 0744) SpO2:  [95 %-100 %] 100 % (04/07 0744) Weight:  [222 lb 3.2 oz (100.789 kg)-229 lb 6 oz (104.044 kg)] 224 lb 6.9 oz (101.8 kg) (04/07 0546) Last BM Date: 02/20/16  Weight change: Filed Weights   02/20/16 1051 02/20/16 1733 02/21/16 0546  Weight: 229 lb 6 oz (104.044 kg) 222 lb 3.2 oz (100.789 kg) 224 lb 6.9 oz (101.8 kg)    Intake/Output:   Intake/Output Summary (Last 24 hours) at 02/21/16 0809 Last data filed at 02/21/16 0600  Gross per 24 hour  Intake    220 ml  Output    775 ml  Net   -555 ml     Physical Exam: General:  Well appearing. No resp difficulty HEENT: normal Neck: supple. JVP to ear  . Carotids 2+ bilat; no bruits. No lymphadenopathy or thryomegaly appreciated. Cor: PMI nondisplaced. Regular rate & rhythm. No rubs, gallops or murmurs. Lungs: clear Abdomen: soft, nontender, distended. No hepatosplenomegaly. No bruits or masses. Good bowel sounds. Extremities: no cyanosis, clubbing, rash, R and LLE 3+  edema Neuro: alert & orientedx3, cranial nerves grossly intact. moves all 4 extremities w/o difficulty. Affect pleasant  Telemetry: NSR 80s   Labs: Basic Metabolic Panel:  Recent Labs Lab 02/20/16 1124 02/21/16 0357  NA 146* 142  K 4.2 3.8  CL 111 105  CO2 24 23  GLUCOSE 110* 108*  BUN 18 20  CREATININE 1.56* 1.52*  CALCIUM 9.2 9.2    Liver Function Tests:  Recent Labs Lab 02/20/16 1124  AST 26  ALT 16  ALKPHOS 163*  BILITOT 1.8*  PROT 6.0*  ALBUMIN 3.0*    Recent Labs Lab 02/20/16 1124  LIPASE 25    No results for input(s): AMMONIA in the last 168 hours.  CBC:  Recent Labs Lab 02/20/16 1124  WBC 8.4  HGB 13.1  HCT 42.5  MCV 86.2  PLT 304    Cardiac Enzymes: No results for input(s): CKTOTAL, CKMB, CKMBINDEX, TROPONINI in the last 168 hours.  BNP: BNP (last 3 results)  Recent Labs  08/12/15 1703 09/02/15 1000 02/20/16 1124  BNP 1799.8* 263.3* 1564.1*    ProBNP (last 3 results) No results for input(s): PROBNP in the last 8760 hours.    Other results:  Imaging: Dg Chest 2 View  02/20/2016  CLINICAL DATA:  Cough and body swelling and facial swelling for 3 days, history GERD, coronary artery disease post MI, ischemic cardiomyopathy, hypertension, hyperlipidemia, CHF, smoking EXAM: CHEST  2 VIEW COMPARISON:  08/12/2015, 01/29/2015 FINDINGS: Enlargement of cardiac silhouette with pulmonary vascular congestion. Minimal prominence of RIGHT hilum unchanged since 01/29/2015. Minimal RIGHT basilar atelectasis though improved since previous exams. No new infiltrate, pleural effusion or pneumothorax. Bones unremarkable. IMPRESSION: Improved aeration RIGHT base. Enlargement of cardiac silhouette with pulmonary vascular congestion. No acute abnormalities. Electronically Signed   By: Ulyses Southward M.D.   On: 02/20/2016 13:56      Medications:     Scheduled Medications: . atorvastatin  40 mg Oral q1800  .  carvedilol  3.125 mg Oral BID WC  . cholecalciferol  2,000 Units Oral Daily  . ciprofloxacin  500 mg Oral BID  . enoxaparin (LOVENOX) injection  40 mg Subcutaneous Q24H  . furosemide  80 mg Intravenous BID  . lisinopril  2.5 mg Oral BID  . pneumococcal 23 valent vaccine  0.5 mL Intramuscular Tomorrow-1000  . potassium chloride  40 mEq Oral Daily  . sodium chloride flush  3 mL Intravenous Q12H  . spironolactone  25 mg Oral Daily  . ticagrelor  60 mg Oral BID     Infusions:     PRN Medications:  sodium chloride, acetaminophen, benzonatate, ondansetron (ZOFRAN) IV,  sodium chloride flush   Assessment/Plan    1. Acute/Chronic Systolic Heart Failure- ICM .CMRI 07/2015 EF ~19%. No thrombus noted. In the past she has been referred to EP but no showed.  Presented to [facility] ED with increased abdominal distension and leg edema. NYHA III-IV. CXR with pulmonary congestion. Marked volume overload not surprising as she has not been taking lasix twice a day.  Continue lasix 80 mg IV twice daily and add 5 mg metolazone.  Continue carvedilol 3.125 mg twice a day, lisinopril 2.5 mg bid, and spiro 25 mg daily.  - Add hydralazine 25 mg tid + 30 mg imdur daily Check ECHO.  2. HTN- Elevated but as above Add hydralazine 25 mg tid/imdur 30 daily.  3. CAD: s/p anterior MI with history of stent thrombosis after missing Brilinta. Continue Brilinta 60 mg bid + statin. Angioedema with aspirin. No chest pain. Troponin 0.00 4. Ascites - Set up paracentesis.  5. UTI- UA + Culture pending. 2/7 Cipro  6. CKD- Stage III Creatinine 1.5--1.7. Todays creatinine 1.5   Consult cardiac rehab and dietitian.   Length of Stay: 1   Amy Clegg NP-C  02/21/2016, 8:09 AM  Advanced Heart Failure Team Pager [phone] (M-F; 7a - 4p)  Please contact [facility] for night-coverage after hours (4p -7a ) and weekends on [email or web address]  Patient seen and examined with Tonye Becket, NP. We discussed all aspects of the encounter. I agree with the assessment and plan as stated above.  She remains markedly volume overloaded. Agree with metolazone. Stressed need to be compliant with fluid restriction. Continue abx for UTI. Watchr enal function.   Bensimhon, Daniel,MD 9:38 AM

## 2016-02-21 NOTE — Progress Notes (Signed)
1500 Offered to walk with pt but she is tired and would like to rest a little more. Encouraged her to walk with staff later. Will follow up tomorrow as time permits. [name] RN BSN 02/21/2016 3:00 PM

## 2016-02-21 NOTE — Clinical Social Work Note (Signed)
Clinical Social Worker received referral for medication assistance.  Chart reviewed. RN Case Manager will follow up with patient to discuss medication assistance if appropriate prior to discharge.    CSW signing off - please re consult if social work needs arise.  Macario Golds, [place] [phone]

## 2016-02-21 NOTE — Care Management Note (Deleted)
Case Management Note  Patient Details  Name: Deborah Ho MRN: [id number] Date of Birth: 1950/11/21  Subjective/Objective:         CHF           Action/Plan: Patient known to me from previous admission. Patient can greatly benefit from [facility] at discharge for ongoing teaching/education. HHC choice offered, patient chose [facility] for [facility] services for Disease Management Program for CHF. Mary with [facility] called for arrangements.   Expected Discharge Date:   possibly 4/ 08/2016              Expected Discharge Plan:  Home w Home Health Services   Discharge planning Services  CM Consult  Choice offered to:  Patient  HH Arranged:  RN, Disease Management HH Agency:  [facility]  Status of Service:  In process, will continue to follow  [name] [phone] 02/21/2016, 1:17 PM

## 2016-02-21 NOTE — Progress Notes (Signed)
Received message from the Soc Worker for Medication Assistance.  CM talked to patient about insurance coverage, patient stated that she had [address] and has changed insurance plans Programme researcher, broadcasting/film/video / Secondary school teacher). Patient stated that she has been in contact with [facility] and is waiting for them to mail out her new insurance card. CM called [employer] with the patient's permission, per Tedd Sias, her contract with [employer] expired 11/16/2015 and they do not have any documentation from her since that time. Patient stated that she have information about her new insurance plan at home. CM encouraged patient to call them to send her a new card as soon as possible. [facility] on [address] called - they are waiting on her new insurance card. CM talked to the patient at length about Brilinta and how important it is for her not to miss a dose. Patient stated that she have about 4 bottles at home ( samples) with some in each bottle.  In Epic it is listed as her insurance provided [employer]. Patient stated that she does not have this insurance. Admitting called, they verified insurance and stated that BCBS is active at this time. CM asked patient again, if she had BCBS, patient stated no. Difficult case, since patient has insurance, CM cannot provide any assistance with getting her medication. She has been on this medication for 2 yrs and states that the only problem she is having how is getting her insurance card to show to the pharmacy to get her medication. CM will continue to follow for DCP; [name] [phone]

## 2016-02-21 NOTE — Progress Notes (Signed)
Brief Nutrition Education Note  RD received consult for diet education on low sodium diet.   RD attempted to meet with pt x 3, however, pt was unavailable at times of visits. Noted that pt has been educated previously on previous admissions (08/17/14 and 05/11/14). Per chart review, pt with difficulty affording medications; Case Manager has been consulted.   RD left "Heart Failure Nutrition Therapy" handout from [employer] at bedside. RD contact information also provided. Will follow-up on Monday, 02/24/16, for reinforcement of principles if pt is still hospitalized.   Dorita Rowlands A. Mayford Knife, RD, LDN, CDE Pager: [phone] After hours Pager: [phone]

## 2016-02-21 NOTE — Progress Notes (Signed)
Member: Deborah Ho, Deborah Ho [[id number]]  Plan: BCBS OTHER [20807] Payor: [facility]*    Coverage Information    Coverage information:     Subscriber: [id number] Deborah Ho,Deborah Ho     Rel to sub: 01 - Self     Member ID: [id number]     Payor: [employer]     Benefit plan: [id number] OTHER Ph: [phone]     Group number: [id number]     Member effective dates: from 01/15/16   BIN [id number]    CM called BCBS- patient is active with them since 01/2016; Patient goes to the [facility] on [address] [phone]). Pharmacy called, information given to the pharmacist - patient can go there and pick up her medication at discharge with insurance coverage.

## 2016-02-22 LAB — BASIC METABOLIC PANEL
Anion gap: 13 (ref 5–15)
BUN: 25 mg/dL — ABNORMAL HIGH (ref 6–20)
CO2: 29 mmol/L (ref 22–32)
Calcium: 9.3 mg/dL (ref 8.9–10.3)
Chloride: 100 mmol/L — ABNORMAL LOW (ref 101–111)
Creatinine, Ser: 1.59 mg/dL — ABNORMAL HIGH (ref 0.44–1.00)
GFR calc Af Amer: 39 mL/min — ABNORMAL LOW (ref >60)
GFR calc non Af Amer: 33 mL/min — ABNORMAL LOW (ref >60)
Glucose, Bld: 100 mg/dL — ABNORMAL HIGH (ref 65–99)
Potassium: 3.5 mmol/L (ref 3.5–5.1)
Sodium: 142 mmol/L (ref 135–145)

## 2016-02-22 LAB — GLUCOSE, CAPILLARY: Glucose-Capillary: 103 mg/dL — ABNORMAL HIGH (ref 65–99)

## 2016-02-22 NOTE — Progress Notes (Signed)
CARDIAC REHAB PHASE I   PRE:  Rate/Rhythm: 86 sinus rhythm  BP:  Supine:   Sitting: 125/72  Standing:    SaO2: 97% ra  MODE:  Ambulation: 400 ft   POST:  Rate/Rhythem: 87 sinus rhythm  BP:  Supine:   Sitting: 131/78  Standing:    SaO2: 98% ra  1000-1055  Pt ambulated in hallway x1 assist, slow steady gait.  Pt returned to bed with call light in reach.  Education completed including low sodium diet, fluid restriction, daily weights, s/s and when to call MD,  low fat, low cholesterol diet, carbohydrate counting, exercise guidelines and outpatient cardiac rehab.  Referral will be sent to [place] [facility].    Pt verbalized understanding.

## 2016-02-22 NOTE — Progress Notes (Signed)
Subjective:  Breathing is better.  Weight down 10 pounds according to sales.  Renal function is stable.  Objective:  Vital Signs in the last 24 hours: BP 121/58 mmHg  Pulse 84  Temp(Src) 98.1 F (36.7 C) (Oral)  Resp 18  Ht 5\' 5"  (1.651 m)  Wt 97.387 kg (214 lb 11.2 oz)  BMI 35.73 kg/m2  SpO2 99%  Physical Exam: Obese black female in no acute distress Lungs:  Reduced breath sounds left base  Cardiac:  Regular rhythm, normal S1 and S2, no S3, JVD noted Abdomen:  Soft, nontender, no masses, distended Extremities:  1-2+ edema present  Intake/Output from previous day: 04/07 0701 - 04/08 0700 In: 1245 [P.O.:1245] Out: 4900 [Urine:4900] Weight Filed Weights   02/20/16 1733 02/21/16 0546 02/22/16 0633  Weight: 100.789 kg (222 lb 3.2 oz) 101.8 kg (224 lb 6.9 oz) 97.387 kg (214 lb 11.2 oz)    Lab Results: Basic Metabolic Panel:  Recent Labs  04/23/16 0357 02/22/16 0413  NA 142 142  K 3.8 3.5  CL 105 100*  CO2 23 29  GLUCOSE 108* 100*  BUN 20 25*  CREATININE 1.52* 1.59*    CBC:  Recent Labs  02/20/16 1124  WBC 8.4  HGB 13.1  HCT 42.5  MCV 86.2  PLT 304    BNP    Component Value Date/Time   BNP 1564.1* 02/20/2016 1124   Telemetry:Sinus rhythm  Assessment/Plan:  1.  Acute on chronic systolic heart failure worsened due to medical noncompliance 2.  Hypertension 3.  Coronary artery disease with previous anterior infarction 4.  Urinary tract infection  Recommendations:  Continue intravenous furosemide again today.  Major problem was missing diuretic doses and to much fluid.  She has not been weighing daily at home.      04/21/2016  MD [facility]  02/22/2016, 9:55 AM

## 2016-02-23 LAB — BASIC METABOLIC PANEL
Anion gap: 15 (ref 5–15)
BUN: 28 mg/dL — ABNORMAL HIGH (ref 6–20)
CO2: 28 mmol/L (ref 22–32)
Calcium: 9.6 mg/dL (ref 8.9–10.3)
Chloride: 95 mmol/L — ABNORMAL LOW (ref 101–111)
Creatinine, Ser: 1.51 mg/dL — ABNORMAL HIGH (ref 0.44–1.00)
GFR calc Af Amer: 41 mL/min — ABNORMAL LOW (ref >60)
GFR calc non Af Amer: 35 mL/min — ABNORMAL LOW (ref >60)
Glucose, Bld: 92 mg/dL (ref 65–99)
Potassium: 3.5 mmol/L (ref 3.5–5.1)
Sodium: 138 mmol/L (ref 135–145)

## 2016-02-23 NOTE — Progress Notes (Signed)
Subjective:  Says that she feels better today.  The short of breath and was able ambulate some.  She continues to have edema.  Her weight is down about 16 pounds and her renal function has improved.  She continues to have very poor insight into fluid restriction as well as need to weigh daily..  Objective:  Vital Signs in the last 24 hours: BP 126/66 mmHg  Pulse 86  Temp(Src) 98 F (36.7 C) (Oral)  Resp 18  Ht 5\' 5"  (1.651 m)  Wt 94.53 kg (208 lb 6.4 oz)  BMI 34.68 kg/m2  SpO2 98%  Physical Exam: Obese black female in no acute distress Lungs:  Reduced breath sounds left base  Cardiac:  Regular rhythm, normal S1 and S2, no S3, JVD noted Abdomen:  Soft, nontender, no masses, distended Extremities:  1-2+ edema present  Intake/Output from previous day: 04/08 0701 - 04/09 0700 In: 840 [P.O.:840] Out: 4000 [Urine:4000] Weight Filed Weights   02/21/16 0546 02/22/16 0633 02/23/16 0500  Weight: 101.8 kg (224 lb 6.9 oz) 97.387 kg (214 lb 11.2 oz) 94.53 kg (208 lb 6.4 oz)    Lab Results: Basic Metabolic Panel:  Recent Labs  04/24/16 0413 02/23/16 0343  NA 142 138  K 3.5 3.5  CL 100* 95*  CO2 29 28  GLUCOSE 100* 92  BUN 25* 28*  CREATININE 1.59* 1.51*    CBC:  Recent Labs  02/20/16 1124  WBC 8.4  HGB 13.1  HCT 42.5  MCV 86.2  PLT 304    BNP    Component Value Date/Time   BNP 1564.1* 02/20/2016 1124   Telemetry:Sinus rhythm  Assessment/Plan:  1.  Acute on chronic systolic heart failure worsened due to medical noncomplianceClinically better with significant weight loss since here. 2.  Hypertension 3.  Coronary artery disease with previous anterior infarction 4.  Urinary tract infection  Recommendations:  Continue intravenous furosemide again today.  Major problem was missing diuretic doses and to much fluid.  She has not been weighing daily at home.  If stable would let go home tomorrow.  She still has edema on examination today some would prefer to give  one more day of intravenous diuresis prior to going home.  She'll need early follow-up in the heart clinic and also intensive education.      04/21/2016  MD [facility]  02/23/2016, 9:43 AM

## 2016-02-24 LAB — BASIC METABOLIC PANEL
Anion gap: 13 (ref 5–15)
BUN: 28 mg/dL — ABNORMAL HIGH (ref 6–20)
CO2: 31 mmol/L (ref 22–32)
Calcium: 9.9 mg/dL (ref 8.9–10.3)
Chloride: 92 mmol/L — ABNORMAL LOW (ref 101–111)
Creatinine, Ser: 1.51 mg/dL — ABNORMAL HIGH (ref 0.44–1.00)
GFR calc Af Amer: 41 mL/min — ABNORMAL LOW (ref >60)
GFR calc non Af Amer: 35 mL/min — ABNORMAL LOW (ref >60)
Glucose, Bld: 87 mg/dL (ref 65–99)
Potassium: 4 mmol/L (ref 3.5–5.1)
Sodium: 136 mmol/L (ref 135–145)

## 2016-02-24 MED ORDER — POTASSIUM CHLORIDE CRYS ER 20 MEQ PO TBCR
40.0000 meq | EXTENDED_RELEASE_TABLET | Freq: Once | ORAL | Status: AC
  Administered 2016-02-24: 40 meq via ORAL
  Filled 2016-02-24: qty 2

## 2016-02-24 NOTE — Progress Notes (Addendum)
CARDIAC REHAB PHASE I   PRE:  Rate/Rhythm: 89 SR  BP:  Sitting: 124/79        SaO2: 99 RA  MODE:  Ambulation: 460 ft   POST:  Rate/Rhythm: 96 SR  BP:  Sitting: 138/83         SaO2: 96 RA  Pt ambulated 460 ft on RA, independent, slow, steady gait, tolerated well, no complaints. Encouraged increased distance, ambulation x 2 more today. Reinforced sodium and fluid restrictions, exercise guidelines, CRP2 (referral sent to [facility]), pt verbalized understanding. Pt to edge of bed per pt request after walk, call bell within reach. Will follow.    [id number]  Joylene Grapes, RN, BSN 02/24/2016 10:43 AM

## 2016-02-24 NOTE — Plan of Care (Signed)
Problem: Food- and Nutrition-Related Knowledge Deficit (NB-1.1) Goal: Nutrition education Formal process to instruct or train a patient/client in a skill or to impart knowledge to help patients/clients voluntarily manage or modify food choices and eating behavior to maintain or improve health. Outcome: Completed/Met Date Met:  02/24/16 NUTRITION EDUCATION NOTE  Pt seen for consult for low sodium diet education for CHF.   Pt was given "Low Sodium Nutrition Therapy" handout. Intern discussed low sodium diet, label reading, low sodium recommendations, low sodium foods and high sodium foods, and gave recommendations for lower sodium alternatives based on pt diet recall. Intern encouraged fruit and vegetable consumption and lower consumption of processed foods. Pt asked about fluid restriction of 1 L per MD. Intern discussed strategies to stay within limits (ie measuring all liquids, not just water). Teach back method was utilized.   Expect good compliance.  Pt reports some prior knowledge about a low sodium diet. Pt reports a better understanding of what needs to be done upon discharge after this education. Labs reviewed; Cl 95 mmol/L, BUN 28 mg/dl, creatinine 1.5 mg/dl, GFR 41 ml/min, CBGs 103 mg/dl. Meds reviewed; Lasix 80 mg BID, vit D 2000 IU, KCl 40 mEq BID. No further nutrition interventions are warranted at this time. If questions or concerns arise, please re-consult as needed.   Geoffery Lyons, [place] [facility] Dietetic Intern Pager [phone]

## 2016-02-24 NOTE — Progress Notes (Signed)
Advanced Heart Failure Rounding Note   Subjective:    Admitted with volume overload. Started on lasix 80 mg IV twice a day. Over the weekend she continued to diurese with IV lasix + metolazone. Overall weight down 29 pounds.   Overall feeling better. Denies SOB.    ECHO 02/21/2016 EF 15% RV severely reduced, bi-atrial enlargement, severe TR. Mild TR,    Objective:   Weight Range:  Vital Signs:   Temp:  [97.3 F (36.3 C)-98.1 F (36.7 C)] 98.1 F (36.7 C) (04/10 6010) Pulse Rate:  [83-84] 84 (04/10 0633) Resp:  [18] 18 (04/10 0633) BP: (111-131)/(61-82) 117/65 mmHg (04/10 0633) SpO2:  [98 %-100 %] 98 % (04/10 9323) Weight:  [200 lb 9.6 oz (90.992 kg)] 200 lb 9.6 oz (90.992 kg) (04/10 0633) Last BM Date: 02/23/16  Weight change: Filed Weights   02/22/16 0633 02/23/16 0500 02/24/16 0633  Weight: 214 lb 11.2 oz (97.387 kg) 208 lb 6.4 oz (94.53 kg) 200 lb 9.6 oz (90.992 kg)    Intake/Output:   Intake/Output Summary (Last 24 hours) at 02/24/16 0757 Last data filed at 02/24/16 5573  Gross per 24 hour  Intake    920 ml  Output   2700 ml  Net  -1780 ml     Physical Exam: General:  Well appearing. No resp difficulty. Sitting on the side of the bed.  HEENT: normal Neck: supple. JVP jaw. Carotids 2+ bilat; no bruits. No lymphadenopathy or thryomegaly appreciated. Cor: PMI nondisplaced. Regular rate & rhythm. No rubs, gallops or murmurs. Lungs: clear Abdomen: soft, nontender, distended. No hepatosplenomegaly. No bruits or masses. Good bowel sounds. Extremities: no cyanosis, clubbing, rash, R and LLE 2+  edema Neuro: alert & orientedx3, cranial nerves grossly intact. moves all 4 extremities w/o difficulty. Affect pleasant  Telemetry: NSR 80s   Labs: Basic Metabolic Panel:  Recent Labs Lab 02/20/16 1124 02/21/16 0357 02/22/16 0413 02/23/16 0343  NA 146* 142 142 138  K 4.2 3.8 3.5 3.5  CL 111 105 100* 95*  CO2 24 23 29 28   GLUCOSE 110* 108* 100* 92  BUN 18 20  25* 28*  CREATININE 1.56* 1.52* 1.59* 1.51*  CALCIUM 9.2 9.2 9.3 9.6    Liver Function Tests:  Recent Labs Lab 02/20/16 1124  AST 26  ALT 16  ALKPHOS 163*  BILITOT 1.8*  PROT 6.0*  ALBUMIN 3.0*    Recent Labs Lab 02/20/16 1124  LIPASE 25   No results for input(s): AMMONIA in the last 168 hours.  CBC:  Recent Labs Lab 02/20/16 1124  WBC 8.4  HGB 13.1  HCT 42.5  MCV 86.2  PLT 304    Cardiac Enzymes: No results for input(s): CKTOTAL, CKMB, CKMBINDEX, TROPONINI in the last 168 hours.  BNP: BNP (last 3 results)  Recent Labs  08/12/15 1703 09/02/15 1000 02/20/16 1124  BNP 1799.8* 263.3* 1564.1*    ProBNP (last 3 results) No results for input(s): PROBNP in the last 8760 hours.    Other results:  Imaging: No results found.   Medications:     Scheduled Medications: . atorvastatin  40 mg Oral q1800  . carvedilol  3.125 mg Oral BID WC  . cholecalciferol  2,000 Units Oral Daily  . ciprofloxacin  500 mg Oral BID  . enoxaparin (LOVENOX) injection  40 mg Subcutaneous Q24H  . furosemide  80 mg Intravenous BID  . hydrALAZINE  25 mg Oral TID  . isosorbide mononitrate  30 mg Oral Daily  .  lisinopril  2.5 mg Oral BID  . metolazone  5 mg Oral Daily  . potassium chloride  40 mEq Oral Daily  . sodium chloride flush  3 mL Intravenous Q12H  . spironolactone  25 mg Oral Daily  . ticagrelor  60 mg Oral BID    Infusions:    PRN Medications: sodium chloride, acetaminophen, benzonatate, ondansetron (ZOFRAN) IV, sodium chloride flush   Assessment/Plan    1. Acute/Chronic Systolic Heart Failure- ICM .CMRI 07/2015 EF ~19%. No thrombus noted. In the past she has been referred to EP but no showed.  Presented to [facility] ED with increased abdominal distension and leg edema. NYHA III-IV. CXR with pulmonary congestion. On admit marked volume overload not surprising as she has not been taking lasix twice a day.  Needs another day of  lasix 80 mg IV twice daily and mg  metolazone. Anticipate switching torsemide 40 mg twice a day.  Continue carvedilol 3.125 mg twice a day, lisinopril 2.5 mg bid, and spiro 25 mg daily.  - Continue hydralazine 25 mg tid + 30 mg imdur daily 2. HTN- Stable. Continue hydralazine 25 mg tid/imdur 30 daily.  3. CAD: s/p anterior MI with history of stent thrombosis after missing Brilinta. Continue Brilinta 60 mg bid + statin. Angioedema with aspirin. No chest pain. Troponin 0.00 4. Ascites - Set up paracentesis on admit but not enough to remove.   5. UTI- UA + Culture insignificant. Completed 5 days of cipro.   6. CKD- Stage III Creatinine 1.5--1.7. Todays creatinine 1.5   Cardiac rehab following..  Dietitian consult pending.    Length of Stay: 4  Amy Clegg NP-C  02/24/2016, 7:57 AM  Advanced Heart Failure Team Pager [phone] (M-F; 7a - 4p)  Please contact [facility] for night-coverage after hours (4p -7a ) and weekends on [email or web address]  Patient seen and examined with Tonye Becket, NP. We discussed all aspects of the encounter. I agree with the assessment and plan as stated above.   Still volume overloaded. Continue IV diuresis one more day. Renal function and electrolytes stable. Will supp K.  Daelin Haste,MD 9:29 AM

## 2016-02-25 LAB — BASIC METABOLIC PANEL
Anion gap: 12 (ref 5–15)
BUN: 33 mg/dL — ABNORMAL HIGH (ref 6–20)
CO2: 32 mmol/L (ref 22–32)
Calcium: 9.8 mg/dL (ref 8.9–10.3)
Chloride: 93 mmol/L — ABNORMAL LOW (ref 101–111)
Creatinine, Ser: 1.67 mg/dL — ABNORMAL HIGH (ref 0.44–1.00)
GFR calc Af Amer: 36 mL/min — ABNORMAL LOW (ref >60)
GFR calc non Af Amer: 31 mL/min — ABNORMAL LOW (ref >60)
Glucose, Bld: 108 mg/dL — ABNORMAL HIGH (ref 65–99)
Potassium: 4 mmol/L (ref 3.5–5.1)
Sodium: 137 mmol/L (ref 135–145)

## 2016-02-25 LAB — MAGNESIUM: Magnesium: 2.2 mg/dL (ref 1.7–2.4)

## 2016-02-25 MED ORDER — TORSEMIDE 20 MG PO TABS: 40.0000 mg | ORAL_TABLET | Freq: Two times a day (BID) | ORAL | Status: DC

## 2016-02-25 MED ORDER — TORSEMIDE 20 MG PO TABS: 40.0000 mg | ORAL_TABLET | Freq: Every day | ORAL | Status: DC

## 2016-02-25 MED ORDER — FUROSEMIDE 10 MG/ML IJ SOLN
80.0000 mg | Freq: Once | INTRAMUSCULAR | Status: AC
  Administered 2016-02-25: 80 mg via INTRAVENOUS
  Filled 2016-02-25: qty 8

## 2016-02-25 MED ORDER — ISOSORBIDE MONONITRATE ER 30 MG PO TB24: 30.0000 mg | ORAL_TABLET | Freq: Every day | ORAL | Status: DC

## 2016-02-25 MED ORDER — HYDRALAZINE HCL 25 MG PO TABS: 25.0000 mg | ORAL_TABLET | Freq: Three times a day (TID) | ORAL | Status: DC

## 2016-02-25 MED ORDER — POTASSIUM CHLORIDE CRYS ER 20 MEQ PO TBCR: EXTENDED_RELEASE_TABLET | ORAL | Status: DC

## 2016-02-25 MED ORDER — CARVEDILOL 3.125 MG PO TABS: 3.1250 mg | ORAL_TABLET | Freq: Two times a day (BID) | ORAL | Status: DC

## 2016-02-25 MED ORDER — SPIRONOLACTONE 25 MG PO TABS: 25.0000 mg | ORAL_TABLET | Freq: Every day | ORAL | Status: DC

## 2016-02-25 MED ORDER — BENZONATATE 100 MG PO CAPS: 100.0000 mg | ORAL_CAPSULE | Freq: Three times a day (TID) | ORAL | Status: DC | PRN

## 2016-02-25 MED ORDER — LISINOPRIL 2.5 MG PO TABS: 2.5000 mg | ORAL_TABLET | Freq: Two times a day (BID) | ORAL | Status: DC

## 2016-02-25 MED ORDER — TICAGRELOR 60 MG PO TABS: 60.0000 mg | ORAL_TABLET | Freq: Two times a day (BID) | ORAL | Status: DC

## 2016-02-25 NOTE — Discharge Summary (Signed)
Advanced Heart Failure Discharge Note   Discharge Summary   Patient ID: Deborah Ho MRN: [id number], DOB/AGE: [birth year] 65 y.o. Admit date: 02/20/2016 D/C date:     02/25/2016   Primary Discharge Diagnoses:  1. Acute/Chronic Systolic Heart Failure : ICM 2. HTN 3. CAD 4. Ascites 5. UTI- UA - completed 5 days of cipro 6. CKD Stage III  Hospital Course:  Deborah Ho is a 65 y.o. female with a history of HTN, HLD, tobacco abuse, CAD s/panterior STEMI with early stent thrombosis for missing doses of Brilinta s/p PCI x 2 into LAD (04/2014) and chronic systolic HF with EF 20% by 3/16 echo. 07/2015 C-MRI 19% LAD territory scare, no thrombus.  She presented to [facility] with increased abdominal distention and leg edema. Had not been taking her torsemide as directed. Admitted for volume overloaded and treated with 80 mg IV lasix BID.   US paracentesis set up with abdominal distention, though not enough ascites to pull off.   UA positive, but culture insignificant. Completed 5 days of cipro.  Creatinine stable with IV lasix.  Hydralazine and Imdur added for improved HTN control.   ECHO 02/21/2016 EF 15% RV severely reduced, bi-atrial enlargement, severe TR. Mild TR. Further worsened from 07/25/15 with EF 25%.  Creatinine trended up very slightly 02/25/16 so transitioned back to po torsemide for home. Stable symptomatically with improved edema and weight loss. Overall she diuresed 11.5 L and down 29 lbs with IV lasix up to 80 mg BID with 5 mg metolazone daily.   She will be discharged to home in stable condition with close follow up in the HF clinic as below. She will also follow up with EP as outpatient as below for ICD consideration with long-standing reduced EF.   Discharge Weight Range: 195 lbs Discharge Vitals: Blood pressure 114/97, pulse 91, temperature 98.2 F (36.8 C), temperature source Oral, resp. rate 18, height 5\' 5"  (1.651 m), weight 195 lb 4.8 oz (88.587 kg), SpO2 96 %.  Labs: Lab  Results  Component Value Date   WBC 8.4 02/20/2016   HGB 13.1 02/20/2016   HCT 42.5 02/20/2016   MCV 86.2 02/20/2016   PLT 304 02/20/2016    Recent Labs Lab 02/20/16 1124  02/25/16 0327  NA 146*  < > 137  K 4.2  < > 4.0  CL 111  < > 93*  CO2 24  < > 32  BUN 18  < > 33*  CREATININE 1.56*  < > 1.67*  CALCIUM 9.2  < > 9.8  PROT 6.0*  --   --   BILITOT 1.8*  --   --   ALKPHOS 163*  --   --   ALT 16  --   --   AST 26  --   --   GLUCOSE 110*  < > 108*  < > = values in this interval not displayed. Lab Results  Component Value Date   CHOL 209* 07/04/2014   HDL 48 07/04/2014   LDLCALC 127* 07/04/2014   TRIG 171* 07/04/2014   BNP (last 3 results)  Recent Labs  08/12/15 1703 09/02/15 1000 02/20/16 1124  BNP 1799.8* 263.3* 1564.1*    ProBNP (last 3 results) No results for input(s): PROBNP in the last 8760 hours.   Diagnostic Studies/Procedures   No results found.  Discharge Medications     Medication List    STOP taking these medications        furosemide 40 MG tablet  Commonly known as:  LASIX      TAKE these medications        atorvastatin 80 MG tablet  Commonly known as:  LIPITOR  Take 0.5 tablets (40 mg total) by mouth daily at 6 PM.     benzonatate 100 MG capsule  Commonly known as:  TESSALON  Take 1 capsule (100 mg total) by mouth 3 (three) times daily as needed for cough.     carvedilol 3.125 MG tablet  Commonly known as:  COREG  Take 1 tablet (3.125 mg total) by mouth 2 (two) times daily with a meal.     cetirizine 10 MG tablet  Commonly known as:  ZYRTEC  Take 10 mg by mouth daily as needed for allergies.     cholecalciferol 1000 units tablet  Commonly known as:  VITAMIN D  Take 2 tablets (2,000 Units total) by mouth daily.     hydrALAZINE 25 MG tablet  Commonly known as:  APRESOLINE  Take 1 tablet (25 mg total) by mouth 3 (three) times daily.     isosorbide mononitrate 30 MG 24 hr tablet  Commonly known as:  IMDUR  Take 1 tablet  (30 mg total) by mouth daily.     lisinopril 2.5 MG tablet  Commonly known as:  PRINIVIL,ZESTRIL  Take 1 tablet (2.5 mg total) by mouth 2 (two) times daily.     Magnesium Oxide 400 (240 Mg) MG Tabs  Take 400 mg by mouth daily.     metolazone 2.5 MG tablet  Commonly known as:  ZAROXOLYN  Take 2.5 mg by mouth. As needed for 5 lb weight gain     nitroGLYCERIN 0.4 MG SL tablet  Commonly known as:  NITROSTAT  Place 0.4 mg under the tongue every 5 (five) minutes as needed for chest pain.     ondansetron 4 MG disintegrating tablet  Commonly known as:  ZOFRAN ODT  Take 1 tablet (4 mg total) by mouth every 8 (eight) hours as needed for nausea or vomiting.     PEPCID AC 10 MG chewable tablet  Generic drug:  famotidine  Chew 10 mg by mouth 2 (two) times daily as needed for heartburn.     potassium chloride SA 20 MEQ tablet  Commonly known as:  K-DUR,KLOR-CON  Take 40 mEq (2 tablets) in every morning and 20 mEq (1 tablet) every afternoon.     spironolactone 25 MG tablet  Commonly known as:  ALDACTONE  Take 1 tablet (25 mg total) by mouth daily.     ticagrelor 60 MG Tabs tablet  Commonly known as:  BRILINTA  Take 1 tablet (60 mg total) by mouth 2 (two) times daily.     torsemide 20 MG tablet  Commonly known as:  DEMADEX  Take 2 tablets (40 mg total) by mouth 2 (two) times daily.        Disposition   The patient will be discharged in stable condition to home.  Discharge Instructions    Amb Referral to Cardiac Rehabilitation    Complete by:  As directed   Diagnosis:  Heart Failure (see criteria below if ordering Phase II)  Heart Failure Type:  Chronic Systolic     Diet - low sodium heart healthy    Complete by:  As directed      Heart Failure patients record your daily weight using the same scale at the same time of day    Complete by:  As directed      Increase activity slowly  Complete by:  As directed           Follow-up Information    Follow up with [facility].   Why:  They will send a nurse to your home   Contact information:   [address] [place] [address] [phone]       Follow up with [facility] On 03/03/2016.   Specialty:  Cardiology   Why:  at 300 pm for post hospital follow up. Please bring all of your medications to your visit. The code for parking is 2000.   Contact information:   [address] [id number] mc [place] [address] [phone]      Follow up with Sherryl Manges, MD On 03/05/2016.   Specialty:  Cardiology   Why:  at 315 for paper work for 330 appointment for ICD consideration.    Contact information:   [address] [place] [address] [phone]         Duration of Discharge Encounter: Greater than 35 minutes   Signed, [name] 02/25/2016, 11:08 AM    Patient seen and examined with Otilio Saber, PA-C. We discussed all aspects of the encounter. I agree with the assessment and plan as stated above.   Stable for d/c. Long discussion about need for compliance with meds and dietary restriction. May be Paramedicine candidate.   Bensimhon, Daniel,MD 11:11 AM

## 2016-02-25 NOTE — Progress Notes (Signed)
CARDIAC REHAB PHASE I   PRE:  Rate/Rhythm: 85 SR    BP: sitting 115/59    SaO2:   MODE:  Ambulation: 760 ft   POST:  Rate/Rhythm: 91 SR    BP: sitting 134/75     SaO2:   Tolerated well, increased distance. Reviewed ed. Pt struggles to comprehend the big picture, asking why her EF is low and what she can do about it. She is fixated on her coughing PTA and asking if that made her EF low.  Encouraged CRPII and gave her another brochure.  [id number]  Harriet Masson CES, ACSM 02/25/2016 11:50 AM

## 2016-02-25 NOTE — Progress Notes (Signed)
Advanced Heart Failure Rounding Note   Subjective:    Admitted with volume overload. Started on lasix 80 mg IV twice a day. Brisk diuresis noted with IV lasix + metolazone. verall weight down 34 pounds.   Overall feeling better. Denies SOB. Wants to go home.    ECHO 02/21/2016 EF 15% RV severely reduced, bi-atrial enlargement, severe TR. Mild TR,    Objective:   Weight Range:  Vital Signs:   Temp:  [97.8 F (36.6 C)-98.2 F (36.8 C)] 98.2 F (36.8 C) (04/11 0606) Pulse Rate:  [80-89] 88 (04/11 0606) Resp:  [17-18] 18 (04/11 0606) BP: (97-121)/(58-74) 97/58 mmHg (04/11 0606) SpO2:  [96 %-99 %] 96 % (04/11 0606) Weight:  [195 lb 4.8 oz (88.587 kg)] 195 lb 4.8 oz (88.587 kg) (04/11 0228) Last BM Date: 02/24/16  Weight change: Filed Weights   02/23/16 0500 02/24/16 0633 02/25/16 0228  Weight: 208 lb 6.4 oz (94.53 kg) 200 lb 9.6 oz (90.992 kg) 195 lb 4.8 oz (88.587 kg)    Intake/Output:   Intake/Output Summary (Last 24 hours) at 02/25/16 0814 Last data filed at 02/25/16 0227  Gross per 24 hour  Intake    960 ml  Output   3675 ml  Net  -2715 ml     Physical Exam: General:  Well appearing. No resp difficulty. Sitting on the side of the bed.  HEENT: normal Neck: supple. JVP 7-8 Carotids 2+ bilat; no bruits. No lymphadenopathy or thryomegaly appreciated. Cor: PMI nondisplaced. Regular rate & rhythm. No rubs, gallops or murmurs. Lungs: clear Abdomen: soft, nontender, distended. No hepatosplenomegaly. No bruits or masses. Good bowel sounds. Extremities: no cyanosis, clubbing, rash, R and LLE 1+  edema Neuro: alert & orientedx3, cranial nerves grossly intact. moves all 4 extremities w/o difficulty. Affect pleasant  Telemetry: NSR 80s   Labs: Basic Metabolic Panel:  Recent Labs Lab 02/21/16 0357 02/22/16 0413 02/23/16 0343 02/24/16 1133 02/25/16 0327  NA 142 142 138 136 137  K 3.8 3.5 3.5 4.0 4.0  CL 105 100* 95* 92* 93*  CO2 23 29 28 31  32  GLUCOSE 108*  100* 92 87 108*  BUN 20 25* 28* 28* 33*  CREATININE 1.52* 1.59* 1.51* 1.51* 1.67*  CALCIUM 9.2 9.3 9.6 9.9 9.8  MG  --   --   --   --  2.2    Liver Function Tests:  Recent Labs Lab 02/20/16 1124  AST 26  ALT 16  ALKPHOS 163*  BILITOT 1.8*  PROT 6.0*  ALBUMIN 3.0*    Recent Labs Lab 02/20/16 1124  LIPASE 25   No results for input(s): AMMONIA in the last 168 hours.  CBC:  Recent Labs Lab 02/20/16 1124  WBC 8.4  HGB 13.1  HCT 42.5  MCV 86.2  PLT 304    Cardiac Enzymes: No results for input(s): CKTOTAL, CKMB, CKMBINDEX, TROPONINI in the last 168 hours.  BNP: BNP (last 3 results)  Recent Labs  08/12/15 1703 09/02/15 1000 02/20/16 1124  BNP 1799.8* 263.3* 1564.1*    ProBNP (last 3 results) No results for input(s): PROBNP in the last 8760 hours.    Other results:  Imaging: No results found.   Medications:     Scheduled Medications: . atorvastatin  40 mg Oral q1800  . carvedilol  3.125 mg Oral BID WC  . cholecalciferol  2,000 Units Oral Daily  . enoxaparin (LOVENOX) injection  40 mg Subcutaneous Q24H  . furosemide  80 mg Intravenous BID  .  hydrALAZINE  25 mg Oral TID  . isosorbide mononitrate  30 mg Oral Daily  . lisinopril  2.5 mg Oral BID  . metolazone  5 mg Oral Daily  . potassium chloride  40 mEq Oral Daily  . sodium chloride flush  3 mL Intravenous Q12H  . spironolactone  25 mg Oral Daily  . ticagrelor  60 mg Oral BID    Infusions:    PRN Medications: sodium chloride, acetaminophen, benzonatate, ondansetron (ZOFRAN) IV, sodium chloride flush   Assessment/Plan    1. Acute/Chronic Systolic Heart Failure- ICM .CMRI 07/2015 EF ~19%. No thrombus noted. In the past she has been referred to EP but no showed.  Presented to [facility] ED with increased abdominal distension and leg edema. NYHA III-IV. CXR with pulmonary congestion. On admit marked volume overload not surprising as she has not been taking lasix twice a day.  ECHO repeat with EF  15%. Reduce RV function. Restrictive filling pattern.  Give one more dose of IV lasix and transition to torsemide 40 mg twice a day. Creatinine starting to trend up. Overall weight down 34 pounds.  Continue carvedilol 3.125 mg twice a day, lisinopril 2.5 mg bid, and spiro 25 mg daily.  - Continue hydralazine 25 mg tid + 30 mg imdur daily Dietitian consult appreciated- HF education Cardiac Rehab following.  2. HTN- Stable. Continue hydralazine 25 mg tid/imdur 30 daily.  3. CAD: s/p anterior MI with history of stent thrombosis after missing Brilinta. Continue Brilinta 60 mg bid + statin. Angioedema with aspirin. No chest pain. Troponin 0.00 4. Ascites - Set up paracentesis on admit but not enough to remove.   5. UTI- UA + Culture insignificant. Completed 5 days of cipro.   6. CKD- Stage III Creatinine 1.5--1.7. Todays creatinine 1.67   Wants to go home.  Plan to refer to EP for outpatient evaluation for ICD. Set up F/U in HF clinic next week.    Length of Stay: 5  Amy Clegg NP-C  02/25/2016, 8:14 AM  Advanced Heart Failure Team Pager [phone] (M-F; 7a - 4p)  Please contact [facility] for night-coverage after hours (4p -7a ) and weekends on [email or web address]   Patient seen and examined with Tonye Becket, NP. We discussed all aspects of the encounter. I agree with the assessment and plan as stated above.   Much improved. Can go home today with close f/u in HF Clinic. Long discussion about need for compliance with meds and dietary restriction. May be Paramedicine candidate.   Pegeen Stiger,MD 11:11 AM

## 2016-03-02 NOTE — Progress Notes (Signed)
Patient ID: Deborah Ho, female   DOB: [birth year], 65 y.o.   MRN: [id number]    Advanced Heart Failure Clinic Note   PCP: [name] Primary HF MD: Dr Shirlee Latch   HPI: Deborah Ho is a 65 y.o. female with a history of HTN, HLD, tobacco abuse, CAD s/panterior STEMI with early stent thrombosis for missing doses of Brilinta s/p PCI x 2 into LAD (04/2014) and chronic systolic HF with EF 20% by 3/16 echo.   Admitted to [facility] in 3/16 with CHF and R pleural effusion. Effusion tapped (transudative). Echo with EF down to 20%. Diuresed with IV lasix and transitioned torsemide + metolazone as needed for weight >187 pounds. Creatinine was 1.7 on the day discharge. Discharge weight 183 pounds.   She was readmitted in 4/16 with CHF in the setting of dietary and ?medical noncompliance.  She was diuresed with IV Lasix and discharged.    Admitted 02/20/16 - 02/25/16 with volume overload. She had not been compliant with her torsemide.  She diuresed 11.5 L and 29 lbs. Discharge 195 lbs.  She returns today for post hospital follow up. Overall has been feeling good since discharge.  Walking at least twice a day for 5 minute intervals, and is trying to increase. Denies any SOB on flat ground. Denies CP. Drinks a lot of fluid at home, but still thinks she is drinking less than 2 L.  Watches her salt intake.  Denies orthopnea/PND. No lightheadedness or dizziness.   9/16: Cardiac MRI with EF 19%, LAD territory scar (nonviable), no thrombus.  07/25/2015: ECHO EF 25% RV normal 01/30/2015 ECHO EF 20%  11/13/2014: ECHO EF 35-40% RV normal.  Echo 02/21/16 15%.    Labs 02/05/2015: K 4.7 Creatinine 1.72  Labs 01/28/2015: K 3.4 Creatinine 1.16  Labs 4/16: K 3.3, creatinine 1.72 Labs 6/16: K 2.9, creatinine 0.97, BNP 1536, HCT 42.2 Labs 7/16: K 3.4, creatinine 1.18 Labs 9/16: creatinine 1.5  ROS: All systems negative except as listed in HPI, PMH and Problem List.  SH:  Social History   Social History  .  Marital Status: Widowed    Spouse Name: N/A  . Number of Children: N/A  . Years of Education: N/A   Occupational History  . not employed    Social History Main Topics  . Smoking status: Current Some Day Smoker -- 0.10 packs/day for .5 years    Types: Cigarettes  . Smokeless tobacco: Current User     Comment: down to 3 cigarettes daily (08/03/14)  . Alcohol Use: No  . Drug Use: No  . Sexual Activity: Not on file   Other Topics Concern  . Not on file   Social History Narrative   Patient has 6 brothers and sisters and none have known coronary artery disease. She lives alone in [place], but has several brothers in the area.    FH:  Family History  Problem Relation Age of Onset  . Diabetes Mother   . Hypertension Mother     Past Medical History  Diagnosis Date  . GERD (gastroesophageal reflux disease)     Probable  . CAD (coronary artery disease) 05/03/14; 05/09/14    a. anterior STEMI with early in-stent thorombosis for missed dose of Brillinta s/p PCI with DESx 2 into LAD (04/2014)  . HTN (hypertension)   . Hyperlipidemia   . Tobacco use   . Non compliance w medication regimen   . Ischemic cardiomyopathy     a. 04/2014 ECHO with EF 45-50% b.  Repeat 2D echo 08/14/14 with EF down at 15%. Life vest placed    Current Outpatient Prescriptions  Medication Sig Dispense Refill  . benzonatate (TESSALON) 100 MG capsule Take 1 capsule (100 mg total) by mouth 3 (three) times daily as needed for cough. 20 capsule 0  . carvedilol (COREG) 3.125 MG tablet Take 1 tablet (3.125 mg total) by mouth 2 (two) times daily with a meal. 60 tablet 6  . cetirizine (ZYRTEC) 10 MG tablet Take 10 mg by mouth daily as needed for allergies.    . cholecalciferol (VITAMIN D) 1000 UNITS tablet Take 2 tablets (2,000 Units total) by mouth daily.    . famotidine (PEPCID AC) 10 MG chewable tablet Chew 10 mg by mouth 2 (two) times daily as needed for heartburn.    . hydrALAZINE (APRESOLINE) 25 MG tablet Take 1  tablet (25 mg total) by mouth 3 (three) times daily. 90 tablet 6  . lisinopril (PRINIVIL,ZESTRIL) 2.5 MG tablet Take 1 tablet (2.5 mg total) by mouth 2 (two) times daily. 60 tablet 6  . metolazone (ZAROXOLYN) 2.5 MG tablet Take 2.5 mg by mouth. As needed for 5 lb weight gain    . nitroGLYCERIN (NITROSTAT) 0.4 MG SL tablet Place 0.4 mg under the tongue every 5 (five) minutes as needed for chest pain.    Marland Kitchen ondansetron (ZOFRAN ODT) 4 MG disintegrating tablet Take 1 tablet (4 mg total) by mouth every 8 (eight) hours as needed for nausea or vomiting. 10 tablet 0  . potassium chloride SA (K-DUR,KLOR-CON) 20 MEQ tablet Take 40 mEq (2 tablets) in every morning and 20 mEq (1 tablet) every afternoon. 100 tablet 6  . spironolactone (ALDACTONE) 25 MG tablet Take 1 tablet (25 mg total) by mouth daily. 30 tablet 6  . ticagrelor (BRILINTA) 60 MG TABS tablet Take 1 tablet (60 mg total) by mouth 2 (two) times daily. 60 tablet 6  . torsemide (DEMADEX) 20 MG tablet Take 2 tablets (40 mg total) by mouth 2 (two) times daily. 120 tablet 5  . atorvastatin (LIPITOR) 80 MG tablet Take 0.5 tablets (40 mg total) by mouth daily at 6 PM. (Patient not taking: Reported on 03/03/2016) 15 tablet 6  . isosorbide mononitrate (IMDUR) 30 MG 24 hr tablet Take 1 tablet (30 mg total) by mouth daily. (Patient not taking: Reported on 03/03/2016) 30 tablet 6  . Magnesium Oxide 400 (240 MG) MG TABS Take 400 mg by mouth daily. (Patient not taking: Reported on 03/03/2016) 30 tablet 3   No current facility-administered medications for this encounter.    Filed Vitals:   03/03/16 1507  BP: 120/70  Pulse: 100  Weight: 196 lb 6.4 oz (89.086 kg)  SpO2: 97%    PHYSICAL EXAM: General:  Well appearing. No resp difficulty. In a wheelchair.  HEENT: normal Neck: supple. JVP not elevated. Carotids 2+ bilaterally; no bruits. No thyromegaly or nodule noted. Cor: PMI normal. RRR, slightly tachy. No M/G/R noted Lungs: CTAB, normal effort.  Abdomen:  soft, NT, ND, no HSM. No bruits or masses. +BS  Extremities: no cyanosis, clubbing, rash.  Trace ankle edema.   Neuro: alert & orientedx3, cranial nerves grossly intact. Moves all 4 extremities w/o difficulty. Affect pleasant.  ASSESSMENT & PLAN: 1.  Chronic systolic CHF:  Ischemic cardiomyopathy, Echo 02/21/16 15%.  NYHA class II. - Volume status stable on exam.  - Continue torsemide 40 mg bid.  Check BMET today.  - Continue lisinopril 2.5 mg bid.  - Continue spironolactone 25 daily.  -  Continue Coreg 3.125 mg bid. She refuses to up-titrate this medicine today despite discussion of the benefit. Wants to wait until after ICD incline.  - Continue hydralazine 25 mg TID and restart imdur at 15 mg daily.  - Persistently low EF.  Sees Dr Graciela Husbands 03/05/16 for ICD consideration.  - Restart magnesium 400 mg daily.  2. CAD: s/p anterior MI,  PCI x2 LAD 04/2014.  History of stent thrombosis after missing Brilinta. No recent chest pain. She is allergic to ASA (angioedema-like reaction).  - She is taking atorvastatin 40 mg daily.  Check Lipids today per Dr Shirlee Latch. Recent LFTs stable. - Continue Brilinta 60 mg bid long-term.  3. HTN - Stable on current medications - She believes SBP < 120 are "too low for her" and is resistant to further med titration. She a  Follow up 4 weeks. CMET today. Adding back Imdur as above. She has very poor insight into her disease and requires frequent re-direction when discussing her medications and disease state.  She is at high risk for re-admission.   Deborah Dollar Jaclene Bartelt PA-C 03/03/2016   Total time spent > 25 minutes, over half discussing the above.

## 2016-03-03 ENCOUNTER — Ambulatory Visit (HOSPITAL_COMMUNITY)
Admit: 2016-03-03 | Discharge: 2016-03-03 | Disposition: A | Discharge status: Home / Self Care | Payer: BLUE CROSS/BLUE SHIELD | Source: Ambulatory Visit | Attending: Cardiology | Admitting: Cardiology

## 2016-03-03 VITALS — BP 120/70 | HR 100 | Wt 196.4 lb

## 2016-03-03 DIAGNOSIS — Z955 Presence of coronary angioplasty implant and graft: Secondary | ICD-10-CM | POA: Insufficient documentation

## 2016-03-03 DIAGNOSIS — J209 Acute bronchitis, unspecified: Secondary | ICD-10-CM

## 2016-03-03 DIAGNOSIS — N183 Chronic kidney disease, stage 3 unspecified: Secondary | ICD-10-CM

## 2016-03-03 DIAGNOSIS — Z79899 Other long term (current) drug therapy: Secondary | ICD-10-CM | POA: Diagnosis not present

## 2016-03-03 DIAGNOSIS — I509 Heart failure, unspecified: Secondary | ICD-10-CM

## 2016-03-03 DIAGNOSIS — J90 Pleural effusion, not elsewhere classified: Secondary | ICD-10-CM

## 2016-03-03 DIAGNOSIS — Z886 Allergy status to analgesic agent status: Secondary | ICD-10-CM | POA: Diagnosis not present

## 2016-03-03 DIAGNOSIS — J948 Other specified pleural conditions: Secondary | ICD-10-CM

## 2016-03-03 DIAGNOSIS — F4323 Adjustment disorder with mixed anxiety and depressed mood: Secondary | ICD-10-CM | POA: Diagnosis not present

## 2016-03-03 DIAGNOSIS — I5022 Chronic systolic (congestive) heart failure: Secondary | ICD-10-CM

## 2016-03-03 DIAGNOSIS — I252 Old myocardial infarction: Secondary | ICD-10-CM | POA: Insufficient documentation

## 2016-03-03 DIAGNOSIS — I5023 Acute on chronic systolic (congestive) heart failure: Secondary | ICD-10-CM | POA: Diagnosis not present

## 2016-03-03 DIAGNOSIS — Z7902 Long term (current) use of antithrombotics/antiplatelets: Secondary | ICD-10-CM | POA: Insufficient documentation

## 2016-03-03 DIAGNOSIS — R600 Localized edema: Secondary | ICD-10-CM

## 2016-03-03 DIAGNOSIS — I2109 ST elevation (STEMI) myocardial infarction involving other coronary artery of anterior wall: Secondary | ICD-10-CM

## 2016-03-03 DIAGNOSIS — I110 Hypertensive heart disease with heart failure: Secondary | ICD-10-CM | POA: Insufficient documentation

## 2016-03-03 DIAGNOSIS — I255 Ischemic cardiomyopathy: Secondary | ICD-10-CM

## 2016-03-03 DIAGNOSIS — Z8249 Family history of ischemic heart disease and other diseases of the circulatory system: Secondary | ICD-10-CM | POA: Diagnosis not present

## 2016-03-03 DIAGNOSIS — I2510 Atherosclerotic heart disease of native coronary artery without angina pectoris: Secondary | ICD-10-CM

## 2016-03-03 DIAGNOSIS — F17210 Nicotine dependence, cigarettes, uncomplicated: Secondary | ICD-10-CM | POA: Diagnosis not present

## 2016-03-03 DIAGNOSIS — R7989 Other specified abnormal findings of blood chemistry: Secondary | ICD-10-CM

## 2016-03-03 DIAGNOSIS — R945 Abnormal results of liver function studies: Secondary | ICD-10-CM

## 2016-03-03 DIAGNOSIS — I5021 Acute systolic (congestive) heart failure: Secondary | ICD-10-CM

## 2016-03-03 DIAGNOSIS — Z833 Family history of diabetes mellitus: Secondary | ICD-10-CM | POA: Diagnosis not present

## 2016-03-03 DIAGNOSIS — J189 Pneumonia, unspecified organism: Secondary | ICD-10-CM

## 2016-03-03 DIAGNOSIS — E785 Hyperlipidemia, unspecified: Secondary | ICD-10-CM | POA: Diagnosis not present

## 2016-03-03 DIAGNOSIS — I10 Essential (primary) hypertension: Secondary | ICD-10-CM

## 2016-03-03 DIAGNOSIS — N179 Acute kidney failure, unspecified: Secondary | ICD-10-CM

## 2016-03-03 DIAGNOSIS — E876 Hypokalemia: Secondary | ICD-10-CM

## 2016-03-03 DIAGNOSIS — Z720 Tobacco use: Secondary | ICD-10-CM

## 2016-03-03 LAB — BASIC METABOLIC PANEL
Anion gap: 10 (ref 5–15)
BUN: 27 mg/dL — ABNORMAL HIGH (ref 6–20)
CO2: 25 mmol/L (ref 22–32)
Calcium: 9.8 mg/dL (ref 8.9–10.3)
Chloride: 106 mmol/L (ref 101–111)
Creatinine, Ser: 1.27 mg/dL — ABNORMAL HIGH (ref 0.44–1.00)
GFR calc Af Amer: 51 mL/min — ABNORMAL LOW (ref >60)
GFR calc non Af Amer: 44 mL/min — ABNORMAL LOW (ref >60)
Glucose, Bld: 91 mg/dL (ref 65–99)
Potassium: 4.5 mmol/L (ref 3.5–5.1)
Sodium: 141 mmol/L (ref 135–145)

## 2016-03-03 LAB — LIPID PANEL
Cholesterol: 227 mg/dL — ABNORMAL HIGH (ref 0–200)
HDL: 56 mg/dL (ref >40)
LDL Cholesterol: 142 mg/dL — ABNORMAL HIGH (ref 0–99)
Total CHOL/HDL Ratio: 4.1 RATIO
Triglycerides: 146 mg/dL (ref <150)
VLDL: 29 mg/dL (ref 0–40)

## 2016-03-03 MED ORDER — MAGNESIUM OXIDE -MG SUPPLEMENT 400 (240 MG) MG PO TABS: 400.0000 mg | ORAL_TABLET | Freq: Every day | ORAL | Status: DC

## 2016-03-03 MED ORDER — ISOSORBIDE MONONITRATE ER 30 MG PO TB24: 15.0000 mg | ORAL_TABLET | Freq: Every day | ORAL | Status: DC

## 2016-03-03 NOTE — Patient Instructions (Signed)
Labs today  RESTART Imdur 15 mg, one half tab daily Continue Spironolactone 25 mg, one tab daily Be sure to Magnesium and Metolazone as prescribed  Your physician recommends that you schedule a follow-up appointment in: 1 months In the [facility]

## 2016-03-04 ENCOUNTER — Telehealth (HOSPITAL_COMMUNITY): Payer: Self-pay | Admitting: Cardiology

## 2016-03-04 MED ORDER — ATORVASTATIN CALCIUM 80 MG PO TABS: 80.0000 mg | ORAL_TABLET | Freq: Every day | ORAL | Status: DC

## 2016-03-04 NOTE — Progress Notes (Signed)
ELECTROPHYSIOLOGY CONSULT NOTE  Patient ID: Deborah Ho, MRN: [id number], DOB/AGE: 1951-06-27 65 y.o. Admit date: (Not on file) Date of Consult: 03/05/2016  Primary Physician: Grayce Sessions, NP Primary Cardiologist: PJ/DB Consulting Physician DB  Chief Complaint: ICD   HPI Deborah Ho is a 65 y.o. female  Referred for consideration of an ICD  Has CAD, having Anterior STEMI 6/15 with early stent thrombosis having missed Brilinta EF has been impaired <30% for a year now;  She has struggled with compliance, recently readmitted for CHF having missed her diuretics.  BP limits uptitration of Guideline directed medical therapy  She denies interval chest pain. She denies syncope palpitations. There has been no recent problems with peripheral edema and no nocturnal dyspnea.  It is not clear to me as to the depth of her insight into her condition   12/15 Echo EF 35-40%  9/16 MRI EF 19% 4/17 Echo EF 15%   Past Medical History  Diagnosis Date  . GERD (gastroesophageal reflux disease)     Probable  . CAD (coronary artery disease) 05/03/14; 05/09/14    a. anterior STEMI with early in-stent thorombosis for missed dose of Brillinta s/p PCI with DESx 2 into LAD (04/2014)  . HTN (hypertension)   . Hyperlipidemia   . Tobacco use   . Non compliance w medication regimen   . Ischemic cardiomyopathy     a. 04/2014 ECHO with EF 45-50% b.  Repeat 2D echo 08/14/14 with EF down at 15%. Life vest placed      Surgical History:  Past Surgical History  Procedure Laterality Date  . Partial hysterectomy    . Cholecystectomy    . Coronary angioplasty with stent placement  05/03/14    STEMI- stent to LAD DES- Xience alpine  . Coronary angioplasty with stent placement  05/09/14    STEMI- overlapping stent to LAD, pt had missed a dose of Brilinta  . Left heart catheterization with coronary angiogram N/A 05/03/2014    Procedure: LEFT HEART CATHETERIZATION WITH CORONARY ANGIOGRAM;  Surgeon:  Peter M Swaziland, MD;  Location: [facility] CATH LAB;  Service: Cardiovascular;  Laterality: N/A;  . Percutaneous stent intervention  05/03/2014    Procedure: PERCUTANEOUS STENT INTERVENTION;  Surgeon: Peter M Swaziland, MD;  Location: [facility] CATH LAB;  Service: Cardiovascular;;  DES Prox LAD   . Left heart catheterization with coronary angiogram N/A 05/09/2014    Procedure: LEFT HEART CATHETERIZATION WITH CORONARY ANGIOGRAM;  Surgeon: Corky Crafts, MD;  Location: [facility] CATH LAB;  Service: Cardiovascular;  Laterality: N/A;     Home Meds: Prior to Admission medications   Medication Sig Start Date End Date Taking? Authorizing Provider  atorvastatin (LIPITOR) 80 MG tablet Take 1 tablet (80 mg total) by mouth daily at 6 PM. 03/04/16   Laurey Morale, MD  benzonatate (TESSALON) 100 MG capsule Take 1 capsule (100 mg total) by mouth 3 (three) times daily as needed for cough. 02/25/16   Graciella Freer, PA-C  carvedilol (COREG) 3.125 MG tablet Take 1 tablet (3.125 mg total) by mouth 2 (two) times daily with a meal. 02/25/16   Graciella Freer, PA-C  cetirizine (ZYRTEC) 10 MG tablet Take 10 mg by mouth daily as needed for allergies.    Historical Provider, MD  cholecalciferol (VITAMIN D) 1000 UNITS tablet Take 2 tablets (2,000 Units total) by mouth daily. 08/19/15   Graciella Freer, PA-C  famotidine (PEPCID AC) 10 MG chewable tablet Chew 10 mg by mouth  2 (two) times daily as needed for heartburn.    Historical Provider, MD  hydrALAZINE (APRESOLINE) 25 MG tablet Take 1 tablet (25 mg total) by mouth 3 (three) times daily. 02/25/16   Graciella Freer, PA-C  isosorbide mononitrate (IMDUR) 30 MG 24 hr tablet Take 0.5 tablets (15 mg total) by mouth daily. 03/03/16   Graciella Freer, PA-C  lisinopril (PRINIVIL,ZESTRIL) 2.5 MG tablet Take 1 tablet (2.5 mg total) by mouth 2 (two) times daily. 02/25/16   Graciella Freer, PA-C  Magnesium Oxide 400 (240 Mg) MG TABS Take 400 mg by mouth daily.  03/03/16   Graciella Freer, PA-C  metolazone (ZAROXOLYN) 2.5 MG tablet Take 2.5 mg by mouth. As needed for 5 lb weight gain    Historical Provider, MD  nitroGLYCERIN (NITROSTAT) 0.4 MG SL tablet Place 0.4 mg under the tongue every 5 (five) minutes as needed for chest pain.    Historical Provider, MD  ondansetron (ZOFRAN ODT) 4 MG disintegrating tablet Take 1 tablet (4 mg total) by mouth every 8 (eight) hours as needed for nausea or vomiting. 07/25/15   Angelina Ok, MD  potassium chloride SA (K-DUR,KLOR-CON) 20 MEQ tablet Take 40 mEq (2 tablets) in every morning and 20 mEq (1 tablet) every afternoon. 02/25/16   Graciella Freer, PA-C  spironolactone (ALDACTONE) 25 MG tablet Take 1 tablet (25 mg total) by mouth daily. 02/25/16   Graciella Freer, PA-C  ticagrelor (BRILINTA) 60 MG TABS tablet Take 1 tablet (60 mg total) by mouth 2 (two) times daily. 02/25/16   Graciella Freer, PA-C  torsemide (DEMADEX) 20 MG tablet Take 2 tablets (40 mg total) by mouth 2 (two) times daily. 02/25/16   Graciella Freer, PA-C    Allergies:  Allergies  Allergen Reactions  . Aspirin Swelling    Chewable children's aspirin makes patients tongue and face swell  . Effient [Prasugrel] Swelling    Patient's tongue and face swells  . Lactose Intolerance (Gi) Other (See Comments)    REACTION: stomach upset  . Robitussin Dm [Guaifenesin-Dm] Swelling    Patient's tongue swells  . Sulfa Antibiotics Swelling  . Wheat Bran Other (See Comments)    REACTION: unknown    Social History   Social History  . Marital Status: Widowed    Spouse Name: N/A  . Number of Children: N/A  . Years of Education: N/A   Occupational History  . not employed    Social History Main Topics  . Smoking status: Current Some Day Smoker -- 0.10 packs/day for .5 years    Types: Cigarettes  . Smokeless tobacco: Current User     Comment: down to 3 cigarettes daily (08/03/14)  . Alcohol Use: No  . Drug Use: No  .  Sexual Activity: Not on file   Other Topics Concern  . Not on file   Social History Narrative   Patient has 6 brothers and sisters and none have known coronary artery disease. She lives alone in [place], but has several brothers in the area.     Family History  Problem Relation Age of Onset  . Diabetes Mother   . Hypertension Mother      ROS:  Please see the history of present illness.     All other systems reviewed and negative.    Physical Exam:   Blood pressure 122/76, pulse 78, height 5' 5.5" (1.664 m), weight 195 lb 12.8 oz (88.814 kg). General: Well developed, well nourished female in no acute  distress. Head: Normocephalic, atraumatic, sclera non-icteric, no xanthomas, nares are without discharge. EENT: normal  Lymph Nodes:  none Neck: Negative for carotid bruits. JVD not elevated. Back:without scoliosis kyphosis  Lungs: Clear bilaterally to auscultation without wheezes, rales, or rhonchi. Breathing is unlabored. Heart: RRR with S1 S2. No  * murmur . No rubs, or gallops appreciated. Abdomen: Soft, non-tender, non-distended with normoactive bowel sounds. No hepatomegaly. No rebound/guarding. No obvious abdominal masses. Msk:  Strength and tone appear normal for age. Extremities: No clubbing or cyanosis. No  edema.  Distal pedal pulses are 2+ and equal bilaterally. Skin: Warm and Dry Neuro: Alert and oriented X 3. CN III-XII intact Grossly normal sensory and motor function . Psych:  Responds to questions appropriately with a normal affect.      Labs: Cardiac Enzymes No results for input(s): CKTOTAL, CKMB, TROPONINI in the last 72 hours. CBC Lab Results  Component Value Date   WBC 8.4 02/20/2016   HGB 13.1 02/20/2016   HCT 42.5 02/20/2016   MCV 86.2 02/20/2016   PLT 304 02/20/2016   PROTIME: No results for input(s): LABPROT, INR in the last 72 hours. Chemistry   Recent Labs Lab 03/03/16 1602  NA 141  K 4.5  CL 106  CO2 25  BUN 27*  CREATININE 1.27*    CALCIUM 9.8  GLUCOSE 91   Lipids Lab Results  Component Value Date   CHOL 227* 03/03/2016   HDL 56 03/03/2016   LDLCALC 142* 03/03/2016   TRIG 146 03/03/2016   BNP PRO B NATRIURETIC PEPTIDE (BNP)  Date/Time Value Ref Range Status  11/05/2014 11:43 AM 321.0* 0.0 - 100.0 pg/mL Final  09/13/2014 06:54 PM 12687.0* 0 - 125 pg/mL Final  08/22/2014 12:26 PM 3059.0* 0 - 125 pg/mL Final  08/13/2014 03:28 PM 6562.0* 0 - 125 pg/mL Final   Thyroid Function Tests: No results for input(s): TSH, T4TOTAL, T3FREE, THYROIDAB in the last 72 hours.  Invalid input(s): FREET3 Miscellaneous No results found for: DDIMER  Radiology/Studies:  Dg Chest 2 View  02/20/2016  CLINICAL DATA:  Cough and body swelling and facial swelling for 3 days, history GERD, coronary artery disease post MI, ischemic cardiomyopathy, hypertension, hyperlipidemia, CHF, smoking EXAM: CHEST  2 VIEW COMPARISON:  08/12/2015, 01/29/2015 FINDINGS: Enlargement of cardiac silhouette with pulmonary vascular congestion. Minimal prominence of RIGHT hilum unchanged since 01/29/2015. Minimal RIGHT basilar atelectasis though improved since previous exams. No new infiltrate, pleural effusion or pneumothorax. Bones unremarkable. IMPRESSION: Improved aeration RIGHT base. Enlargement of cardiac silhouette with pulmonary vascular congestion. No acute abnormalities. Electronically Signed   By: Ulyses Southward M.D.   On: 02/20/2016 13:56   US Abdomen Limited  02/21/2016  CLINICAL DATA:  History of congestive heart failure. Request therapeutic paracentesis if amenable. EXAM: LIMITED ABDOMINAL ULTRASOUND COMPARISON:  None. FINDINGS: Limited ultrasound of the abdomen finds significant edema of the soft tissues. Only trace amount of left lower quadrant and right lower quadrant pelvic ascites is noted. This is insufficient for safe paracentesis. IMPRESSION: Trace abdominal ascites. Read by: Brayton El PA-C Electronically Signed   By: Richarda Overlie M.D.   On:  02/21/2016 12:03    EKG Sinus with Narrow QRS  Assessment and Plan:  Ischemic Cardiomyopathy  CHF-chronic systolic   She is persistent left ventricular dysfunction despite guidelines directed medical therapy. She has modest congestive heart failure and hence has a class I indication for ICD implantation for sudden cardiac death risk reduction.  We reviewed this extensively. She seems to have  some understanding of her heart condition. She asked for Internet resources to research the role of defibrillator. I gave her HRS on [email or web address]--she is to see Dr. Dorthea Cove next month. She will review with him recommendations regarding ICD implantation and will let us know thereafter which she would like to do  Reviewed the potential benefits but also the risks of ICD implantation and also the protocol for post-implant follow up    Sherryl Manges

## 2016-03-04 NOTE — Telephone Encounter (Signed)
Opened in error

## 2016-03-04 NOTE — Telephone Encounter (Signed)
Patient aware.

## 2016-03-04 NOTE — Telephone Encounter (Signed)
-----   Message from Graciella Freer, PA-C sent at 03/04/2016  9:56 AM EDT ----- BMET OK.   Per Dr. Shirlee Latch increase atorvastatin to 80 mg daily. Cholesterol still very high.    Casimiro Needle [address]" [place], PA-C 03/04/2016 9:56 AM

## 2016-03-05 ENCOUNTER — Encounter: Payer: Self-pay | Admitting: Internal Medicine

## 2016-03-05 ENCOUNTER — Ambulatory Visit (INDEPENDENT_AMBULATORY_CARE_PROVIDER_SITE_OTHER): Payer: BLUE CROSS/BLUE SHIELD | Admitting: Internal Medicine

## 2016-03-05 VITALS — BP 122/76 | HR 78 | Ht 65.5 in | Wt 195.8 lb

## 2016-03-05 DIAGNOSIS — I5022 Chronic systolic (congestive) heart failure: Secondary | ICD-10-CM

## 2016-03-05 DIAGNOSIS — I255 Ischemic cardiomyopathy: Secondary | ICD-10-CM

## 2016-03-05 NOTE — Patient Instructions (Signed)
Medication Instructions: - Your physician recommends that you continue on your current medications as directed. Please refer to the Current Medication list given to you today.  Labwork: - none  Procedures/Testing: - none  Follow-Up: - Dr. Graciela Husbands will see you back an an as needed basis.  Any Additional Special Instructions Will Be Listed Below (If Applicable).     If you need a refill on your cardiac medications before your next appointment, please call your pharmacy.

## 2016-03-06 ENCOUNTER — Telehealth (HOSPITAL_COMMUNITY): Payer: Self-pay | Admitting: *Deleted

## 2016-03-06 NOTE — Telephone Encounter (Signed)
Received VM from Almira Coaster, RN w/[facility] she states they received referral for pt on 4/11 and they have contacted pt many times since then but pt refuses to sch an appt for them to come to her home and has refused their services so they are closing her case

## 2016-04-02 ENCOUNTER — Inpatient Hospital Stay (HOSPITAL_COMMUNITY): Admission: RE | Admit: 2016-04-02 | Payer: Self-pay | Source: Ambulatory Visit

## 2016-05-18 ENCOUNTER — Other Ambulatory Visit (HOSPITAL_COMMUNITY): Payer: Self-pay | Admitting: Cardiology

## 2016-06-23 ENCOUNTER — Other Ambulatory Visit: Payer: Self-pay | Admitting: Nurse Practitioner

## 2016-06-23 DIAGNOSIS — Z1231 Encounter for screening mammogram for malignant neoplasm of breast: Secondary | ICD-10-CM

## 2016-06-26 ENCOUNTER — Encounter (HOSPITAL_COMMUNITY): Payer: Self-pay

## 2016-06-26 ENCOUNTER — Inpatient Hospital Stay (HOSPITAL_COMMUNITY)
Admission: EM | Admit: 2016-06-26 | Discharge: 2016-07-06 | DRG: 292 | Disposition: A | Discharge status: Home Health | Payer: BLUE CROSS/BLUE SHIELD | Attending: Family Medicine | Admitting: Family Medicine

## 2016-06-26 ENCOUNTER — Emergency Department (HOSPITAL_COMMUNITY): Payer: BLUE CROSS/BLUE SHIELD

## 2016-06-26 DIAGNOSIS — I2510 Atherosclerotic heart disease of native coronary artery without angina pectoris: Secondary | ICD-10-CM | POA: Diagnosis present

## 2016-06-26 DIAGNOSIS — R609 Edema, unspecified: Secondary | ICD-10-CM

## 2016-06-26 DIAGNOSIS — E871 Hypo-osmolality and hyponatremia: Secondary | ICD-10-CM | POA: Diagnosis not present

## 2016-06-26 DIAGNOSIS — R601 Generalized edema: Secondary | ICD-10-CM | POA: Diagnosis not present

## 2016-06-26 DIAGNOSIS — I130 Hypertensive heart and chronic kidney disease with heart failure and stage 1 through stage 4 chronic kidney disease, or unspecified chronic kidney disease: Principal | ICD-10-CM | POA: Diagnosis present

## 2016-06-26 DIAGNOSIS — I10 Essential (primary) hypertension: Secondary | ICD-10-CM | POA: Diagnosis not present

## 2016-06-26 DIAGNOSIS — Z9119 Patient's noncompliance with other medical treatment and regimen: Secondary | ICD-10-CM

## 2016-06-26 DIAGNOSIS — F17210 Nicotine dependence, cigarettes, uncomplicated: Secondary | ICD-10-CM | POA: Diagnosis present

## 2016-06-26 DIAGNOSIS — Z720 Tobacco use: Secondary | ICD-10-CM | POA: Diagnosis present

## 2016-06-26 DIAGNOSIS — K219 Gastro-esophageal reflux disease without esophagitis: Secondary | ICD-10-CM | POA: Diagnosis present

## 2016-06-26 DIAGNOSIS — E785 Hyperlipidemia, unspecified: Secondary | ICD-10-CM | POA: Diagnosis present

## 2016-06-26 DIAGNOSIS — R600 Localized edema: Secondary | ICD-10-CM

## 2016-06-26 DIAGNOSIS — N183 Chronic kidney disease, stage 3 (moderate): Secondary | ICD-10-CM | POA: Diagnosis present

## 2016-06-26 DIAGNOSIS — R140 Abdominal distension (gaseous): Secondary | ICD-10-CM

## 2016-06-26 DIAGNOSIS — I252 Old myocardial infarction: Secondary | ICD-10-CM

## 2016-06-26 DIAGNOSIS — Z955 Presence of coronary angioplasty implant and graft: Secondary | ICD-10-CM

## 2016-06-26 DIAGNOSIS — I255 Ischemic cardiomyopathy: Secondary | ICD-10-CM | POA: Diagnosis present

## 2016-06-26 DIAGNOSIS — Z66 Do not resuscitate: Secondary | ICD-10-CM | POA: Diagnosis present

## 2016-06-26 DIAGNOSIS — T502X5A Adverse effect of carbonic-anhydrase inhibitors, benzothiadiazides and other diuretics, initial encounter: Secondary | ICD-10-CM | POA: Diagnosis not present

## 2016-06-26 DIAGNOSIS — I509 Heart failure, unspecified: Secondary | ICD-10-CM

## 2016-06-26 LAB — BASIC METABOLIC PANEL
Anion gap: 12 (ref 5–15)
BUN: 17 mg/dL (ref 6–20)
CO2: 22 mmol/L (ref 22–32)
Calcium: 9.4 mg/dL (ref 8.9–10.3)
Chloride: 106 mmol/L (ref 101–111)
Creatinine, Ser: 1.3 mg/dL — ABNORMAL HIGH (ref 0.44–1.00)
GFR calc Af Amer: 49 mL/min — ABNORMAL LOW (ref >60)
GFR calc non Af Amer: 42 mL/min — ABNORMAL LOW (ref >60)
Glucose, Bld: 93 mg/dL (ref 65–99)
Potassium: 3.9 mmol/L (ref 3.5–5.1)
Sodium: 140 mmol/L (ref 135–145)

## 2016-06-26 LAB — CBC WITH DIFFERENTIAL/PLATELET
Basophils Absolute: 0.1 10*3/uL (ref 0.0–0.1)
Basophils Relative: 1 %
Eosinophils Absolute: 0.1 10*3/uL (ref 0.0–0.7)
Eosinophils Relative: 1 %
HCT: 48.7 % — ABNORMAL HIGH (ref 36.0–46.0)
Hemoglobin: 14.8 g/dL (ref 12.0–15.0)
Lymphocytes Relative: 25 %
Lymphs Abs: 2.1 10*3/uL (ref 0.7–4.0)
MCH: 27.8 pg (ref 26.0–34.0)
MCHC: 30.4 g/dL (ref 30.0–36.0)
MCV: 91.5 fL (ref 78.0–100.0)
Monocytes Absolute: 0.7 10*3/uL (ref 0.1–1.0)
Monocytes Relative: 8 %
Neutro Abs: 5.5 10*3/uL (ref 1.7–7.7)
Neutrophils Relative %: 65 %
Platelets: 285 10*3/uL (ref 150–400)
RBC: 5.32 MIL/uL — ABNORMAL HIGH (ref 3.87–5.11)
RDW: 15.2 % (ref 11.5–15.5)
WBC: 8.4 10*3/uL (ref 4.0–10.5)

## 2016-06-26 LAB — BRAIN NATRIURETIC PEPTIDE: B Natriuretic Peptide: 1443.5 pg/mL — ABNORMAL HIGH (ref 0.0–100.0)

## 2016-06-26 LAB — TROPONIN I: Troponin I: <0.03 ng/mL (ref <0.03)

## 2016-06-26 MED ORDER — CARVEDILOL 3.125 MG PO TABS
3.1250 mg | ORAL_TABLET | Freq: Two times a day (BID) | ORAL | Status: DC
  Administered 2016-06-27 (×2): 3.125 mg via ORAL
  Filled 2016-06-26 (×2): qty 1

## 2016-06-26 MED ORDER — SODIUM CHLORIDE 0.9% FLUSH
3.0000 mL | Freq: Two times a day (BID) | INTRAVENOUS | Status: DC
  Administered 2016-06-26 – 2016-07-06 (×20): 3 mL via INTRAVENOUS

## 2016-06-26 MED ORDER — NITROGLYCERIN 0.4 MG SL SUBL: 0.4000 mg | SUBLINGUAL_TABLET | SUBLINGUAL | Status: DC | PRN

## 2016-06-26 MED ORDER — LOSARTAN POTASSIUM 50 MG PO TABS
50.0000 mg | ORAL_TABLET | Freq: Every day | ORAL | Status: DC
  Administered 2016-06-26 – 2016-07-02 (×7): 50 mg via ORAL
  Filled 2016-06-26 (×7): qty 1

## 2016-06-26 MED ORDER — ONDANSETRON HCL 4 MG PO TABS: 4.0000 mg | ORAL_TABLET | Freq: Four times a day (QID) | ORAL | Status: DC | PRN

## 2016-06-26 MED ORDER — SODIUM CHLORIDE 0.9 % IV SOLN: 250.0000 mL | INTRAVENOUS | Status: DC | PRN

## 2016-06-26 MED ORDER — HEPARIN SODIUM (PORCINE) 5000 UNIT/ML IJ SOLN
5000.0000 [IU] | Freq: Three times a day (TID) | INTRAMUSCULAR | Status: DC
  Administered 2016-06-26 – 2016-07-02 (×10): 5000 [IU] via SUBCUTANEOUS
  Filled 2016-06-26 (×14): qty 1

## 2016-06-26 MED ORDER — TRAZODONE HCL 50 MG PO TABS
25.0000 mg | ORAL_TABLET | Freq: Every evening | ORAL | Status: DC | PRN
  Filled 2016-06-26 (×2): qty 1

## 2016-06-26 MED ORDER — SODIUM CHLORIDE 0.9% FLUSH: 3.0000 mL | INTRAVENOUS | Status: DC | PRN

## 2016-06-26 MED ORDER — DIPHENHYDRAMINE HCL 25 MG PO CAPS: 25.0000 mg | ORAL_CAPSULE | Freq: Once | ORAL | Status: DC | PRN

## 2016-06-26 MED ORDER — POLYETHYLENE GLYCOL 3350 17 G PO PACK: 17.0000 g | PACK | Freq: Every day | ORAL | Status: DC | PRN

## 2016-06-26 MED ORDER — FAMOTIDINE 10 MG PO TABS
10.0000 mg | ORAL_TABLET | Freq: Two times a day (BID) | ORAL | Status: DC | PRN
  Administered 2016-06-28 – 2016-07-05 (×2): 10 mg via ORAL
  Filled 2016-06-26 (×4): qty 1

## 2016-06-26 MED ORDER — FUROSEMIDE 10 MG/ML IJ SOLN
40.0000 mg | Freq: Once | INTRAMUSCULAR | Status: AC
  Administered 2016-06-26: 40 mg via INTRAVENOUS
  Filled 2016-06-26: qty 4

## 2016-06-26 MED ORDER — ONDANSETRON HCL 4 MG/2ML IJ SOLN: 4.0000 mg | Freq: Four times a day (QID) | INTRAMUSCULAR | Status: DC | PRN

## 2016-06-26 MED ORDER — HYDROXYZINE HCL 25 MG PO TABS
25.0000 mg | ORAL_TABLET | Freq: Once | ORAL | Status: AC
Start: 2016-06-26 — End: 2016-06-26
  Administered 2016-06-26: 25 mg via ORAL
  Filled 2016-06-26: qty 1

## 2016-06-26 MED ORDER — ACETAMINOPHEN 650 MG RE SUPP: 650.0000 mg | Freq: Four times a day (QID) | RECTAL | Status: DC | PRN

## 2016-06-26 MED ORDER — POTASSIUM CHLORIDE ER 10 MEQ PO TBCR
10.0000 meq | EXTENDED_RELEASE_TABLET | Freq: Every day | ORAL | Status: DC
  Administered 2016-06-26 – 2016-07-03 (×8): 10 meq via ORAL
  Filled 2016-06-26 (×17): qty 1

## 2016-06-26 MED ORDER — FUROSEMIDE 10 MG/ML IJ SOLN
60.0000 mg | Freq: Two times a day (BID) | INTRAMUSCULAR | Status: DC
  Administered 2016-06-27 – 2016-06-28 (×3): 60 mg via INTRAVENOUS
  Filled 2016-06-26 (×3): qty 6

## 2016-06-26 MED ORDER — ACETAMINOPHEN 325 MG PO TABS
650.0000 mg | ORAL_TABLET | Freq: Four times a day (QID) | ORAL | Status: DC | PRN
  Filled 2016-06-26: qty 2

## 2016-06-26 NOTE — ED Triage Notes (Signed)
Pt. Coming from doctors office for leg/abd swelling from CHF and to get a psych evaluation for her depression. Pt. Denies SI/HI. Pt. Frustrated because of her illnesses. Pt. Denies any SOB and sts that she has been taking her medication like she's supposed to. EMS reports right lungs diminished and left lung clear at 100% oxygen saturation on room air.

## 2016-06-26 NOTE — ED Notes (Signed)
Attempted report 

## 2016-06-26 NOTE — ED Notes (Signed)
Admitting at bedside 

## 2016-06-26 NOTE — ED Provider Notes (Signed)
[facility]-EMERGENCY DEPT Provider Note   CSN: [id number] Arrival date & time: 06/26/16  1307  First Provider Contact:  None       History   Chief Complaint Chief Complaint  Patient presents with  . Psychiatric Evaluation    depressed  . Leg Swelling    HPI Deborah Ho is a 65 y.o. female.  HPI   Pt with hx CAD, CHF (EF 15%), HTN, HLD p/w significant weight gain and exasperation about her condition.  Weight has increased significantly over the past two weeks - she has gained weight in her abdomen and legs.  She is unable to walk well because she feels so heavy.  She feels she has been compliant with her regimen and diet, though she did cut back her furosemide to every third day instead of every other day, and eats take out food including occasional fried chicken or fish, pizza, and has a hot dog every Saturday.  She coughs at night time, productive of clear sputum - this is unchanged.  Denies known fevers, CP, SOB, abdominal pain.  Has soreness in her legs from increased fluid.  She is feeling depressed because she keeps gaining fluid and she doesn't understand why.  She feels that she is doing everything she is supposed to be doing and is frustrated.  Denies SI.   Was admitted to the hospital for CHF exacerbation 02/2016, d/c weight was 195.    Past Medical History:  Diagnosis Date  . CAD (coronary artery disease) 05/03/14; 05/09/14   a. anterior STEMI with early in-stent thorombosis for missed dose of Brillinta s/p PCI with DESx 2 into LAD (04/2014)  . GERD (gastroesophageal reflux disease)    Probable  . HTN (hypertension)   . Hyperlipidemia   . Ischemic cardiomyopathy    a. 04/2014 ECHO with EF 45-50% b.  Repeat 2D echo 08/14/14 with EF down at 15%. Life vest placed  . Non compliance w medication regimen   . Tobacco use     Patient Active Problem List   Diagnosis Date Noted  . Abdominal distension 06/26/2016  . Anasarca 06/26/2016  . Acute on chronic systolic heart failure,  NYHA class 4 (HCC) 02/20/2016  . Chronic renal insufficiency   . Acute on chronic systolic CHF (congestive heart failure), NYHA class 4 (HCC) 08/12/2015  . Adjustment disorder with mixed anxiety and depressed mood 02/28/2015  . Acute systolic congestive heart failure (HCC)   . Pleural effusion on right   . Acute bronchitis   . CHF (congestive heart failure) (HCC) 02/23/2015  . Bilateral leg edema   . Essential hypertension   . Pleural effusion, right   . Pleural effusion   . CAP (community acquired pneumonia) 01/29/2015  . Pneumonia, community acquired 01/29/2015  . Systolic CHF, acute on chronic (HCC) 01/29/2015  . Hypokalemia 09/17/2014  . Acute on chronic systolic CHF (congestive heart failure) (HCC) 09/14/2014  . Chronic systolic heart failure (HCC) 08/22/2014  . Angioedema of lips 08/17/2014  . CAD (coronary artery disease)   . Acute systolic CHF (congestive heart failure), NYHA class 3 (HCC) 08/13/2014  . Ischemic cardiomyopathy 08/13/2014  . Acute systolic heart failure (HCC) 08/13/2014  . Coronary atherosclerosis of native coronary artery 05/10/2014  . STEMI (ST elevation myocardial infarction) (HCC) 05/09/2014  . HLD (hyperlipidemia) 05/06/2014  . Tobacco abuse 05/06/2014  . Acute kidney injury (HCC) 05/06/2014  . HTN (hypertension) 05/06/2014  . Cardiomyopathy, ischemic, EF 30-35 % Echo 05/04/14. 05/06/2014  . Elevated LFTs 05/06/2014  .  Acute systolic CHF (congestive heart failure) (HCC) 05/06/2014  . ST elevation myocardial infarction (STEMI) of anterior wall (HCC) 05/03/2014    Past Surgical History:  Procedure Laterality Date  . CHOLECYSTECTOMY    . CORONARY ANGIOPLASTY WITH STENT PLACEMENT  05/03/14   STEMI- stent to LAD DES- Xience alpine  . CORONARY ANGIOPLASTY WITH STENT PLACEMENT  05/09/14   STEMI- overlapping stent to LAD, pt had missed a dose of Brilinta  . LEFT HEART CATHETERIZATION WITH CORONARY ANGIOGRAM N/A 05/03/2014   Procedure: LEFT HEART  CATHETERIZATION WITH CORONARY ANGIOGRAM;  Surgeon: Peter M Swaziland, MD;  Location: [facility] CATH LAB;  Service: Cardiovascular;  Laterality: N/A;  . LEFT HEART CATHETERIZATION WITH CORONARY ANGIOGRAM N/A 05/09/2014   Procedure: LEFT HEART CATHETERIZATION WITH CORONARY ANGIOGRAM;  Surgeon: Corky Crafts, MD;  Location: [facility] CATH LAB;  Service: Cardiovascular;  Laterality: N/A;  . PARTIAL HYSTERECTOMY    . PERCUTANEOUS STENT INTERVENTION  05/03/2014   Procedure: PERCUTANEOUS STENT INTERVENTION;  Surgeon: Peter M Swaziland, MD;  Location: [facility] CATH LAB;  Service: Cardiovascular;;  DES Prox LAD     OB History    No data available       Home Medications    Prior to Admission medications   Medication Sig Start Date End Date Taking? Authorizing Provider  benzonatate (TESSALON) 100 MG capsule Take 1 capsule (100 mg total) by mouth 3 (three) times daily as needed for cough. 02/25/16  Yes Graciella Freer, PA-C  carvedilol (COREG) 3.125 MG tablet Take 1 tablet (3.125 mg total) by mouth 2 (two) times daily with a meal. Patient taking differently: Take 3.125 mg by mouth every 14 (fourteen) days.  02/25/16  Yes Graciella Freer, PA-C  cetirizine (ZYRTEC) 10 MG tablet Take 10 mg by mouth daily as needed for allergies.   Yes Historical Provider, MD  Cholecalciferol (VITAMIN D3 PO) Take 1 capsule by mouth every 14 (fourteen) days.   Yes Historical Provider, MD  famotidine (PEPCID AC) 10 MG chewable tablet Chew 10 mg by mouth 2 (two) times daily as needed for heartburn.   Yes Historical Provider, MD  furosemide (LASIX) 40 MG tablet Take 40 mg by mouth every other day. 04/14/16  Yes Historical Provider, MD  losartan (COZAAR) 50 MG tablet Take 50 mg by mouth daily. 06/23/16  Yes Historical Provider, MD  ondansetron (ZOFRAN ODT) 4 MG disintegrating tablet Take 1 tablet (4 mg total) by mouth every 8 (eight) hours as needed for nausea or vomiting. 07/25/15  Yes Angelina Ok, MD  potassium chloride (K-DUR) 10 MEQ  tablet Take 10 mEq by mouth daily. 06/04/16  Yes Historical Provider, MD  atorvastatin (LIPITOR) 80 MG tablet Take 1 tablet (80 mg total) by mouth daily at 6 PM. Patient not taking: Reported on 06/26/2016 03/04/16   Laurey Morale, MD  hydrALAZINE (APRESOLINE) 25 MG tablet Take 1 tablet (25 mg total) by mouth 3 (three) times daily. Patient not taking: Reported on 06/26/2016 02/25/16   Graciella Freer, PA-C  isosorbide mononitrate (IMDUR) 30 MG 24 hr tablet Take 0.5 tablets (15 mg total) by mouth daily. Patient not taking: Reported on 06/26/2016 03/03/16   Graciella Freer, PA-C  lisinopril (PRINIVIL,ZESTRIL) 2.5 MG tablet Take 1 tablet (2.5 mg total) by mouth 2 (two) times daily. Patient not taking: Reported on 06/26/2016 02/25/16   Graciella Freer, PA-C  Magnesium Oxide 400 (240 Mg) MG TABS Take 400 mg by mouth daily. Patient not taking: Reported on 06/26/2016 03/03/16  Mariam Dollar Tillery, PA-C  metolazone (ZAROXOLYN) 2.5 MG tablet Take 2.5 mg by mouth. As needed for 5 lb weight gain Patient not taking: Reported on 06/26/2016 05/20/16   Laurey Morale, MD  nitroGLYCERIN (NITROSTAT) 0.4 MG SL tablet Place 0.4 mg under the tongue every 5 (five) minutes as needed for chest pain.    Historical Provider, MD  potassium chloride SA (K-DUR,KLOR-CON) 20 MEQ tablet Take 40 mEq (2 tablets) in every morning and 20 mEq (1 tablet) every afternoon. Patient not taking: Reported on 06/26/2016 02/25/16   Graciella Freer, PA-C  spironolactone (ALDACTONE) 25 MG tablet Take 1 tablet (25 mg total) by mouth daily. Patient not taking: Reported on 06/26/2016 02/25/16   Graciella Freer, PA-C  ticagrelor (BRILINTA) 60 MG TABS tablet Take 1 tablet (60 mg total) by mouth 2 (two) times daily. Patient not taking: Reported on 06/26/2016 02/25/16   Graciella Freer, PA-C  torsemide (DEMADEX) 20 MG tablet Take 2 tablets (40 mg total) by mouth 2 (two) times daily. Patient not taking: Reported on  06/26/2016 02/25/16   Graciella Freer, PA-C    Family History Family History  Problem Relation Age of Onset  . Diabetes Mother   . Hypertension Mother     Social History Social History  Substance Use Topics  . Smoking status: Current Some Day Smoker    Packs/day: 0.10    Years: 0.50    Types: Cigarettes  . Smokeless tobacco: Current User     Comment: down to 3 cigarettes daily (08/03/14)  . Alcohol use No     Allergies   Aspirin; Effient [prasugrel]; Lactose intolerance (gi); Robitussin dm [guaifenesin-dm]; Sulfa antibiotics; and Wheat bran   Review of Systems Review of Systems  All other systems reviewed and are negative.    Physical Exam Updated Vital Signs BP (!) 151/106   Pulse 88   Temp 97.7 F (36.5 C) (Oral)   Resp 13   Ht 5\' 5"  (1.651 m)   Wt 104.9 kg   SpO2 100%   BMI 38.48 kg/m   Physical Exam  Constitutional: She appears well-developed and well-nourished. No distress.  HENT:  Head: Normocephalic and atraumatic.  Neck: Neck supple.  Cardiovascular: Normal rate, regular rhythm and intact distal pulses.   Pitting edema to the knees bilaterally and nonpitting edema of the thighs.    Pulmonary/Chest: Effort normal and breath sounds normal. No respiratory distress. She has no wheezes. She has no rales.  Abdominal: Soft. There is no tenderness. There is no rebound and no guarding.  Fullness throughout abdomen   Neurological: She is alert.  Skin: She is not diaphoretic.  Nursing note and vitals reviewed.    ED Treatments / Results  Labs (all labs ordered are listed, but only abnormal results are displayed) Labs Reviewed  BRAIN NATRIURETIC PEPTIDE - Abnormal; Notable for the following:       Result Value   B Natriuretic Peptide 1,443.5 (*)    All other components within normal limits  CBC WITH DIFFERENTIAL/PLATELET - Abnormal; Notable for the following:    RBC 5.32 (*)    HCT 48.7 (*)    All other components within normal limits  BASIC  METABOLIC PANEL - Abnormal; Notable for the following:    Creatinine, Ser 1.30 (*)    GFR calc non Af Amer 42 (*)    GFR calc Af Amer 49 (*)    All other components within normal limits  TROPONIN I    EKG  EKG Interpretation None       Radiology Dg Chest 2 View  Result Date: 06/26/2016 CLINICAL DATA:  Pt reports from PCP for abdomen and leg swelling. Pt states she has gained 15 pounds since April 2017. Pt c/o SOB and intermittent productive cough x a few months. Pt has been on two rounds of antibiotics for cough with no relief. Hx CHF and cardiac surgery. EXAM: CHEST  2 VIEW COMPARISON:  02/20/2016 FINDINGS: Cardiac silhouette is mildly enlarged. No mediastinal or hilar masses or evidence of adenopathy. Aorta is mildly uncoiled. Clear lungs.  No pleural effusion or pneumothorax. Skeletal structures are intact. IMPRESSION: No acute cardiopulmonary disease.  Mild cardiomegaly. Electronically Signed   By: Amie Portland M.D.   On: 06/26/2016 15:25   Dg Abd 2 Views  Result Date: 06/26/2016 CLINICAL DATA:  Pt reports ongoing nausea, vomiting and weight gain. Pt states she has gained 15 pounds since April 2017. Pt denies constipation and diarrhea. EXAM: ABDOMEN - 2 VIEW COMPARISON:  Chest x-ray 06/26/2016 FINDINGS: There is paucity of bowel gas. The visualized bowel gas pattern is nonobstructive. No evidence for organomegaly. No abnormal calcifications. Visualized osseous structures have a normal appearance. IMPRESSION: No evidence for acute  abnormality. Electronically Signed   By: Norva Pavlov M.D.   On: 06/26/2016 18:55    Procedures Procedures (including critical care time)  Medications Ordered in ED Medications  furosemide (LASIX) injection 40 mg (40 mg Intravenous Given 06/26/16 1439)     Initial Impression / Assessment and Plan / ED Course  I have reviewed the triage vital signs and the nursing notes.  Pertinent labs & imaging results that were available during my care of the  patient were reviewed by me and considered in my medical decision making (see chart for details).  Clinical Course  Comment By Time  Patient has DP and PT pulses present bilaterally heard with doppler.   Trixie Dredge, PA-C 08/11 1823    Pt with CHF, EF 15%, with 35 lb weight gain with significant edema in the lower extremities and abdomen.  Pt not complying with medications as directed upon discharge 02/2016, also noncompliant with diet.  She is feeling depressed and confused as she feels she is doing everything right to control her medical problem.  She is admitted to [facility] for diuresis.  Will need significant education regarding her medications and dietary restrictions.  Dr Melynda Ripple accepts admission.    Final Clinical Impressions(s) / ED Diagnoses   Final diagnoses:  Abdominal distension  Peripheral edema    New Prescriptions New Prescriptions   No medications on file     Trixie Dredge, PA-C 06/26/16 1927    Nira Conn, MD 06/27/16 1200

## 2016-06-26 NOTE — ED Notes (Signed)
Pt. Given food with EDP approval   

## 2016-06-26 NOTE — H&P (Signed)
[facility] History and Physical  Mikie Battle VWP:[id number] DOB: 11-05-1951 DOA: 06/26/2016  Referring physician: PCP: Grayce Sessions, NP   Chief Complaint: "I'm usually not this big."  HPI: Deborah Ho is a 65 y.o. female with past medical history of tobacco abuse, medication noncompliance, heart failure EF of 15%, hypertension, reflux and coronary disease presents emergency room with chief complaint of swelling. Patient was advised to come to the emergency room for evaluation after being seen by her primary care doctor. Patient was last seen by her primary care doctor in March 2017. Since then patient has developed on over 30 pounds. Patient's physician feels that her noncompliance with her Lasix has resulted in anasarca. Patient states that she takes her medication every 3 days because she thinks he has been doing better with her fluid control and thinks this frequency is more effective. Patient does not have any shortness of breath. She denies any chest pain.  Patient does not weigh herself daily to monitor her fluid. She also does not take when necessary doses of Lasix.  ED course: In the emergency room the patient was given Lasix and had not 100 mL of fluid off. She had a BNP that is lower than her baseline. Chest had a clear chest x-ray. Showed no evidence of respiratory distress. Hospitalist service was called for admission for observation and continue diuresis. On evaluation emergency room decision was made to get a abdominal film for abdominal distention. Patient also started to complain of lateral foot numbness, sensation like her foot is asleep. No palpable pulse found. This was related to the ED provider Seth Bake) who agreed to ultrasound while in emergency room or to find the vascular probe to confirm good pulses. Provider was able to locate pulses using vascular probe that were more than monophasic PT and DP.  Review of Systems:  As per HPI otherwise 10 point  review of systems negative.   Past Medical History:  Diagnosis Date  . CAD (coronary artery disease) 05/03/14; 05/09/14   a. anterior STEMI with early in-stent thorombosis for missed dose of Brillinta s/p PCI with DESx 2 into LAD (04/2014)  . GERD (gastroesophageal reflux disease)    Probable  . HTN (hypertension)   . Hyperlipidemia   . Ischemic cardiomyopathy    a. 04/2014 ECHO with EF 45-50% b.  Repeat 2D echo 08/14/14 with EF down at 15%. Life vest placed  . Non compliance w medication regimen   . Tobacco use    Past Surgical History:  Procedure Laterality Date  . CHOLECYSTECTOMY    . CORONARY ANGIOPLASTY WITH STENT PLACEMENT  05/03/14   STEMI- stent to LAD DES- Xience alpine  . CORONARY ANGIOPLASTY WITH STENT PLACEMENT  05/09/14   STEMI- overlapping stent to LAD, pt had missed a dose of Brilinta  . LEFT HEART CATHETERIZATION WITH CORONARY ANGIOGRAM N/A 05/03/2014   Procedure: LEFT HEART CATHETERIZATION WITH CORONARY ANGIOGRAM;  Surgeon: Peter M Swaziland, MD;  Location: [facility] CATH LAB;  Service: Cardiovascular;  Laterality: N/A;  . LEFT HEART CATHETERIZATION WITH CORONARY ANGIOGRAM N/A 05/09/2014   Procedure: LEFT HEART CATHETERIZATION WITH CORONARY ANGIOGRAM;  Surgeon: Corky Crafts, MD;  Location: [facility] CATH LAB;  Service: Cardiovascular;  Laterality: N/A;  . PARTIAL HYSTERECTOMY    . PERCUTANEOUS STENT INTERVENTION  05/03/2014   Procedure: PERCUTANEOUS STENT INTERVENTION;  Surgeon: Peter M Swaziland, MD;  Location: [facility] CATH LAB;  Service: Cardiovascular;;  DES Prox LAD    Social History:  reports that she has  been smoking Cigarettes.  She has a 0.05 pack-year smoking history. She uses smokeless tobacco. She reports that she does not drink alcohol or use drugs.  Allergies  Allergen Reactions  . Aspirin Swelling    Chewable children's aspirin makes patients tongue and face swell  . Effient [Prasugrel] Swelling    Patient's tongue and face swells  . Lactose Intolerance (Gi) Other (See  Comments)    REACTION: stomach upset  . Robitussin Dm [Guaifenesin-Dm] Swelling    Patient's tongue swells  . Sulfa Antibiotics Swelling  . Wheat Bran Other (See Comments)    REACTION: unknown    Family History  Problem Relation Age of Onset  . Diabetes Mother   . Hypertension Mother      Prior to Admission medications   Medication Sig Start Date End Date Taking? Authorizing Provider  benzonatate (TESSALON) 100 MG capsule Take 1 capsule (100 mg total) by mouth 3 (three) times daily as needed for cough. 02/25/16  Yes Graciella Freer, PA-C  carvedilol (COREG) 3.125 MG tablet Take 1 tablet (3.125 mg total) by mouth 2 (two) times daily with a meal. Patient taking differently: Take 3.125 mg by mouth every 14 (fourteen) days.  02/25/16  Yes Graciella Freer, PA-C  cetirizine (ZYRTEC) 10 MG tablet Take 10 mg by mouth daily as needed for allergies.   Yes Historical Provider, MD  Cholecalciferol (VITAMIN D3 PO) Take 1 capsule by mouth every 14 (fourteen) days.   Yes Historical Provider, MD  famotidine (PEPCID AC) 10 MG chewable tablet Chew 10 mg by mouth 2 (two) times daily as needed for heartburn.   Yes Historical Provider, MD  furosemide (LASIX) 40 MG tablet Take 40 mg by mouth every other day. 04/14/16  Yes Historical Provider, MD  losartan (COZAAR) 50 MG tablet Take 50 mg by mouth daily. 06/23/16  Yes Historical Provider, MD  ondansetron (ZOFRAN ODT) 4 MG disintegrating tablet Take 1 tablet (4 mg total) by mouth every 8 (eight) hours as needed for nausea or vomiting. 07/25/15  Yes Angelina Ok, MD  potassium chloride (K-DUR) 10 MEQ tablet Take 10 mEq by mouth daily. 06/04/16  Yes Historical Provider, MD  atorvastatin (LIPITOR) 80 MG tablet Take 1 tablet (80 mg total) by mouth daily at 6 PM. Patient not taking: Reported on 06/26/2016 03/04/16   Laurey Morale, MD  hydrALAZINE (APRESOLINE) 25 MG tablet Take 1 tablet (25 mg total) by mouth 3 (three) times daily. Patient not taking:  Reported on 06/26/2016 02/25/16   Graciella Freer, PA-C  isosorbide mononitrate (IMDUR) 30 MG 24 hr tablet Take 0.5 tablets (15 mg total) by mouth daily. Patient not taking: Reported on 06/26/2016 03/03/16   Graciella Freer, PA-C  lisinopril (PRINIVIL,ZESTRIL) 2.5 MG tablet Take 1 tablet (2.5 mg total) by mouth 2 (two) times daily. Patient not taking: Reported on 06/26/2016 02/25/16   Graciella Freer, PA-C  Magnesium Oxide 400 (240 Mg) MG TABS Take 400 mg by mouth daily. Patient not taking: Reported on 06/26/2016 03/03/16   Graciella Freer, PA-C  metolazone (ZAROXOLYN) 2.5 MG tablet Take 2.5 mg by mouth. As needed for 5 lb weight gain Patient not taking: Reported on 06/26/2016 05/20/16   Laurey Morale, MD  nitroGLYCERIN (NITROSTAT) 0.4 MG SL tablet Place 0.4 mg under the tongue every 5 (five) minutes as needed for chest pain.    Historical Provider, MD  potassium chloride SA (K-DUR,KLOR-CON) 20 MEQ tablet Take 40 mEq (2 tablets) in every morning and  20 mEq (1 tablet) every afternoon. Patient not taking: Reported on 06/26/2016 02/25/16   Graciella Freer, PA-C  spironolactone (ALDACTONE) 25 MG tablet Take 1 tablet (25 mg total) by mouth daily. Patient not taking: Reported on 06/26/2016 02/25/16   Graciella Freer, PA-C  ticagrelor (BRILINTA) 60 MG TABS tablet Take 1 tablet (60 mg total) by mouth 2 (two) times daily. Patient not taking: Reported on 06/26/2016 02/25/16   Graciella Freer, PA-C  torsemide (DEMADEX) 20 MG tablet Take 2 tablets (40 mg total) by mouth 2 (two) times daily. Patient not taking: Reported on 06/26/2016 02/25/16   Graciella Freer, PA-C   Physical Exam: Vitals:   06/26/16 1313 06/26/16 1320 06/26/16 1330 06/26/16 1400  BP:  138/88 (!) 155/109 146/93  Pulse:  86 86 86  Resp:  25 19 17   Temp:  97.7 F (36.5 C)    TempSrc:  Oral    SpO2:  100% 100% 100%  Weight: 104.9 kg (231 lb 4.2 oz)     Height: 5\' 5"  (1.651 m)       Wt Readings  from Last 3 Encounters:  06/26/16 104.9 kg (231 lb 4.2 oz)  03/05/16 88.8 kg (195 lb 12.8 oz)  03/03/16 89.1 kg (196 lb 6.4 oz)    General:  Appears calm and comfortable, no acute distress Eyes:  PERRL, EOMI, normal lids, iris ENT:  grossly normal hearing, lips & tongue Neck:  no LAD, masses or thyromegaly Cardiovascular:  RRR, no m/r/g. No LE edema.  Respiratory:  CTA bilaterally, no w/r/r. Normal respiratory effort. Abdomen:  Tense, distended, no rebound or guarding, covered in multiple abdominal scars from past procedures, bowel sounds heard Skin:  no rash or induration seen on limited exam. 3+ pitting edema and lower extremities bilaterally. Trace fluid seen in the abdomen with dependent distribution. Musculoskeletal:  grossly normal tone BUE/BLE Psychiatric:  grossly normal mood and affect, speech fluent and appropriate Neurologic:  CN 2-12 grossly intact, moves all extremities in coordinated fashion.          Labs on Admission:  Basic Metabolic Panel:  Recent Labs Lab 06/26/16 1429  NA 140  K 3.9  CL 106  CO2 22  GLUCOSE 93  BUN 17  CREATININE 1.30*  CALCIUM 9.4   Liver Function Tests: No results for input(s): AST, ALT, ALKPHOS, BILITOT, PROT, ALBUMIN in the last 168 hours. No results for input(s): LIPASE, AMYLASE in the last 168 hours. No results for input(s): AMMONIA in the last 168 hours. CBC:  Recent Labs Lab 06/26/16 1429  WBC 8.4  NEUTROABS 5.5  HGB 14.8  HCT 48.7*  MCV 91.5  PLT 285   Cardiac Enzymes:  Recent Labs Lab 06/26/16 1429  TROPONINI <0.03    BNP (last 3 results)  Recent Labs  09/02/15 1000 02/20/16 1124 06/26/16 1441  BNP 263.3* 1,564.1* 1,443.5*    ProBNP (last 3 results) No results for input(s): PROBNP in the last 8760 hours.   Serum creatinine: 1.3 mg/dL 04/21/16 08/26/16 Estimated creatinine clearance: 52.6 mL/min  CBG: No results for input(s): GLUCAP in the last 168 hours.  Radiological Exams on Admission: Dg  Chest 2 View  Result Date: 06/26/2016 CLINICAL DATA:  Pt reports from PCP for abdomen and leg swelling. Pt states she has gained 15 pounds since April 2017. Pt c/o SOB and intermittent productive cough x a few months. Pt has been on two rounds of antibiotics for cough with no relief. Hx CHF and cardiac surgery. EXAM:  CHEST  2 VIEW COMPARISON:  02/20/2016 FINDINGS: Cardiac silhouette is mildly enlarged. No mediastinal or hilar masses or evidence of adenopathy. Aorta is mildly uncoiled. Clear lungs.  No pleural effusion or pneumothorax. Skeletal structures are intact. IMPRESSION: No acute cardiopulmonary disease.  Mild cardiomegaly. Electronically Signed   By: Amie Portland M.D.   On: 06/26/2016 15:25    EKG: Independently reviewed. Ventricular rate 86, PR interval 190, QRS duration 121, QTC 469, old anterior infarct, results rhythm, no ST elevation MI  No acute changes when compared to April 2017 EKG  Assessment/Plan Principal Problem:   Anasarca Active Problems:   HLD (hyperlipidemia)   Tobacco abuse   HTN (hypertension)   CAD (coronary artery disease)   Abdominal distension   65 year old female female past medical history most significant for medical noncompliance and congestive heart failure presented to the emergency room after being referred by her primary care doctor for significant fluid retention in her lower extremities.  Anasarca Aggressive IV diuresis with furosemide 60 mEq twice a day Strict I's and O's Monitor electrolytes Patient is to be educated on proper use of her medications before discharge  Abdominal distention KUB ordered Patient denies any nausea vomiting is having regular bowel movements passing as ? Large stool burden  Lateral foot numbness Strength in foot is good, foot is not cold, patient is without palpable pulses likely secondary to fluid and being a vasculopath Physician assistant and emergency room use vascular Doppler was able to locate pulses ABI  ordered  Hypertension Patient off of her hydralazine and other medications will monitor.  Congestive heart failure  continue losartan 50 mg daily Restarted patient's Coreg 3.125 mg twice a day K-Dur 10 milliequivalents daily Cardiology may need to be consulted in the morning to see which medications patient should be on for her multiple cardiac issues.  Coronary artery disease  Patient off of her Brilinta 60 mg twice a day When necessary nitroglycerin  CKD3 baseline creatinine around 1.4 Avoid nephrotoxic drugs We'll monitor creatinine  GERD Pepcid 10 mEq twice a day  Code Status: Full code DVT Prophylaxis: Heparin Family Communication: None available Disposition Plan: Pending Improvement    Haydee Salter, MD Family Medicine [facility] [email or web address] Password [email or web address]

## 2016-06-27 DIAGNOSIS — Z720 Tobacco use: Secondary | ICD-10-CM

## 2016-06-27 DIAGNOSIS — E785 Hyperlipidemia, unspecified: Secondary | ICD-10-CM | POA: Diagnosis not present

## 2016-06-27 DIAGNOSIS — I2510 Atherosclerotic heart disease of native coronary artery without angina pectoris: Secondary | ICD-10-CM

## 2016-06-27 DIAGNOSIS — I5022 Chronic systolic (congestive) heart failure: Secondary | ICD-10-CM

## 2016-06-27 DIAGNOSIS — I5023 Acute on chronic systolic (congestive) heart failure: Secondary | ICD-10-CM | POA: Diagnosis not present

## 2016-06-27 DIAGNOSIS — I10 Essential (primary) hypertension: Secondary | ICD-10-CM

## 2016-06-27 DIAGNOSIS — R198 Other specified symptoms and signs involving the digestive system and abdomen: Secondary | ICD-10-CM

## 2016-06-27 DIAGNOSIS — R140 Abdominal distension (gaseous): Secondary | ICD-10-CM | POA: Diagnosis not present

## 2016-06-27 DIAGNOSIS — R601 Generalized edema: Secondary | ICD-10-CM

## 2016-06-27 LAB — BASIC METABOLIC PANEL
Anion gap: 13 (ref 5–15)
BUN: 18 mg/dL (ref 6–20)
CO2: 23 mmol/L (ref 22–32)
Calcium: 9.2 mg/dL (ref 8.9–10.3)
Chloride: 106 mmol/L (ref 101–111)
Creatinine, Ser: 1.41 mg/dL — ABNORMAL HIGH (ref 0.44–1.00)
GFR calc Af Amer: 45 mL/min — ABNORMAL LOW (ref >60)
GFR calc non Af Amer: 38 mL/min — ABNORMAL LOW (ref >60)
Glucose, Bld: 122 mg/dL — ABNORMAL HIGH (ref 65–99)
Potassium: 3.2 mmol/L — ABNORMAL LOW (ref 3.5–5.1)
Sodium: 142 mmol/L (ref 135–145)

## 2016-06-27 LAB — CBC
HCT: 43.8 % (ref 36.0–46.0)
Hemoglobin: 13.5 g/dL (ref 12.0–15.0)
MCH: 27.8 pg (ref 26.0–34.0)
MCHC: 30.8 g/dL (ref 30.0–36.0)
MCV: 90.3 fL (ref 78.0–100.0)
Platelets: 242 10*3/uL (ref 150–400)
RBC: 4.85 MIL/uL (ref 3.87–5.11)
RDW: 15.3 % (ref 11.5–15.5)
WBC: 7.8 10*3/uL (ref 4.0–10.5)

## 2016-06-27 MED ORDER — CARVEDILOL 3.125 MG PO TABS
3.1250 mg | ORAL_TABLET | Freq: Two times a day (BID) | ORAL | Status: DC
  Administered 2016-06-28 – 2016-07-06 (×17): 3.125 mg via ORAL
  Filled 2016-06-27 (×17): qty 1

## 2016-06-27 MED ORDER — POTASSIUM CHLORIDE CRYS ER 20 MEQ PO TBCR
40.0000 meq | EXTENDED_RELEASE_TABLET | ORAL | Status: AC
  Administered 2016-06-27 (×2): 40 meq via ORAL
  Filled 2016-06-27 (×2): qty 2

## 2016-06-27 NOTE — Progress Notes (Signed)
[facility]  PROGRESS NOTE  Deborah Ho JOA:[id number] DOB: [birth year] DOA: 06/26/2016 PCP: Grayce Sessions, NP    Brief HPI:   65 y.o. female  with hx CAD, CHF (EF 15%), HTN, HLD p/w significant weight gain and exasperation about her condition.  Weight has increased significantly over the past two weeks - she has gained weight in her abdomen and legs.  She is unable to walk well because she feels so heavy.  She feels she has been compliant with her regimen and diet, though she did cut back her furosemide to every third day instead of every other day, and eats take out food including occasional fried chicken or fish, pizza, and has a hot dog every Saturday.  She coughs at night time, productive of clear sputum - this is unchanged.  Denies known fevers, CP, SOB, abdominal pain.  Has soreness in her legs from increased fluid. She is feeling depressed because she keeps gaining fluid and she doesn't understand why.  She feels that she is doing everything she is supposed to be doing and is frustrated.  Denies SI.     Assessment/Plan:    1. Acute on chronic systolic CHF- echocardiogram showed EF 15%, continue Lasix 60 mg every 12 hours. Will consult cardiology for further recommendations. Strict intake and output. Continue Aldactone. 2. Abdominal distention- KUB negative, no abdominal pain, nausea or vomiting. 3. Hypertension- blood pressure is stable, patient is off hydralazine. Continue Cozaar, Coreg 4. CKD stage III- patient's baseline creatinine 1.4, today creatinine 1.41. Will closely monitor BMP as patient is on Lasix   DVT prophylaxis: Heparin Code Status: Full code Family Communication: No family at bedside Disposition Plan: Home when medically stable   Consultants:  None   Procedures:  None   Antibiotics:  None   Subjective   Patient seen and examined, admitted with anasarca/fluid overload. Good diuresis with IV Lasix.  Objective    Objective: Vitals:    06/26/16 1900 06/26/16 2058 06/27/16 0013 06/27/16 0546  BP: 137/83 (!) 154/100 (!) 152/92 (!) 150/95  Pulse: 88 93 89 89  Resp: (!) 27 18 (!) 26 (!) 22  Temp:  97.5 F (36.4 C)  97.9 F (36.6 C)  TempSrc:  Oral  Oral  SpO2: 100% 99% 100% 100%  Weight:  104.2 kg (229 lb 12.8 oz)  103.5 kg (228 lb 1.6 oz)  Height:  5\' 5"  (1.651 m)      Intake/Output Summary (Last 24 hours) at 06/27/16 1154 Last data filed at 06/27/16 1024  Gross per 24 hour  Intake              960 ml  Output             2375 ml  Net            -1415 ml   Filed Weights   06/26/16 1313 06/26/16 2058 06/27/16 0546  Weight: 104.9 kg (231 lb 4.2 oz) 104.2 kg (229 lb 12.8 oz) 103.5 kg (228 lb 1.6 oz)    Examination:  General exam: Appears calm and comfortable  Respiratory system: Clear to auscultation bilaterally. Respiratory effort normal. Cardiovascular system: S1 & S2 heard, RRR. No  murmurs, rubs, gallops or clicks. Bilateral edema lower extremities Gastrointestinal system: Abdomen is nondistended, soft and nontender. No organomegaly or masses felt. Normal bowel sounds heard. Central nervous system: Alert and oriented. No focal neurological deficits. Extremities: Symmetric 5 x 5 power. Skin: No rashes, lesions or ulcers Psychiatry: Judgement and insight  appear normal. Mood & affect appropriate.    Data Reviewed: I have personally reviewed following labs and imaging studies Basic Metabolic Panel:  Recent Labs Lab 06/26/16 1429 06/27/16 0506  NA 140 142  K 3.9 3.2*  CL 106 106  CO2 22 23  GLUCOSE 93 122*  BUN 17 18  CREATININE 1.30* 1.41*  CALCIUM 9.4 9.2   CBC:  Recent Labs Lab 06/26/16 1429 06/27/16 0506  WBC 8.4 7.8  NEUTROABS 5.5  --   HGB 14.8 13.5  HCT 48.7* 43.8  MCV 91.5 90.3  PLT 285 242   Cardiac Enzymes:  Recent Labs Lab 06/26/16 1429  TROPONINI <0.03   BNP (last 3 results)  Recent Labs  09/02/15 1000 02/20/16 1124 06/26/16 1441  BNP 263.3* 1,564.1* 1,443.5*       Studies: Dg Chest 2 View  Result Date: 06/26/2016 CLINICAL DATA:  Pt reports from PCP for abdomen and leg swelling. Pt states she has gained 15 pounds since April 2017. Pt c/o SOB and intermittent productive cough x a few months. Pt has been on two rounds of antibiotics for cough with no relief. Hx CHF and cardiac surgery. EXAM: CHEST  2 VIEW COMPARISON:  02/20/2016 FINDINGS: Cardiac silhouette is mildly enlarged. No mediastinal or hilar masses or evidence of adenopathy. Aorta is mildly uncoiled. Clear lungs.  No pleural effusion or pneumothorax. Skeletal structures are intact. IMPRESSION: No acute cardiopulmonary disease.  Mild cardiomegaly. Electronically Signed   By: Amie Portland M.D.   On: 06/26/2016 15:25   Dg Abd 2 Views  Result Date: 06/26/2016 CLINICAL DATA:  Pt reports ongoing nausea, vomiting and weight gain. Pt states she has gained 15 pounds since April 2017. Pt denies constipation and diarrhea. EXAM: ABDOMEN - 2 VIEW COMPARISON:  Chest x-ray 06/26/2016 FINDINGS: There is paucity of bowel gas. The visualized bowel gas pattern is nonobstructive. No evidence for organomegaly. No abnormal calcifications. Visualized osseous structures have a normal appearance. IMPRESSION: No evidence for acute  abnormality. Electronically Signed   By: Norva Pavlov M.D.   On: 06/26/2016 18:55    Scheduled Meds: . carvedilol  3.125 mg Oral BID WC  . furosemide  60 mg Intravenous BID  . heparin  5,000 Units Subcutaneous Q8H  . losartan  50 mg Oral Daily  . potassium chloride  10 mEq Oral Daily  . sodium chloride flush  3 mL Intravenous Q12H   Continuous Infusions:      Time spent: 25 min    [facility]  [facility] Pager [phone]. If 7PM-7AM, please contact night-coverage at [email or web address], Office  [phone]  password [email or web address] 06/27/2016, 11:54 AM  LOS: 0 days

## 2016-06-27 NOTE — Consult Note (Signed)
Name: Deborah Ho is a 65 y.o. female Admit date: 06/26/2016 Referring Physician:  Westley Hummer, M.D. Primary Physician:  [name], NP Primary Cardiologist:  Peter Swaziland, M.D.  Reason for Consultation:  Anasarca  ASSESSMENT: 1. Ischemic cardiomyopathy following anterior myocardial infarction in 2015 with acute on chronic systolic heart failure. 2. Chronic kidney disease stage III 3. Essential hypertension 4. Coronary artery disease with LAD stents and prior history of stent thrombosis. 5. Hyperlipidemia 6. History of poor compliance with medical recommendations and medical therapy.  PLAN: 1. Replete potassium 2. Optimize guideline mandated medical therapy. The current medical regimen is listed multiple agents that overlap making it difficult to know exactly what she is taking at home. 3. Re-discuss AICD. Without increased curettes duration, biventricular resynchronization is not likely to be helpful 4. With history of CK D, we'll need to watch kidney function closely 5. Aggressive diuresis. 6. BNP 7. Troponin   HPI: 65 year old female with history of coronary artery disease, prior anterior myocardial infarction with history of in-stent restenosis and stent thrombosis 2015, ischemic cardiomyopathy, chronic systolic heart failure with EF 15%, COPD, prior tobacco abuse, chronic kidney disease, and medication compliance problems.  2-3 day history of increasing swelling, especially in the abdomen. There is orthopnea with increased exertional dyspnea. She has also noted lower extremity swelling. She denies chest pain.  In reviewing records, she has been referred both to the advanced heart failure clinic and to electrophysiology to facilitate management of chronic ischemic cardiomyopathy/heart failure but has refused AICD and has not followed up in heart failure clinic as requested.  PMH:   Past Medical History:  Diagnosis Date  . CAD (coronary artery disease) 05/03/14;  05/09/14   a. anterior STEMI with early in-stent thorombosis for missed dose of Brillinta s/p PCI with DESx 2 into LAD (04/2014)  . GERD (gastroesophageal reflux disease)    Probable  . HTN (hypertension)   . Hyperlipidemia   . Ischemic cardiomyopathy    a. 04/2014 ECHO with EF 45-50% b.  Repeat 2D echo 08/14/14 with EF down at 15%. Life vest placed  . Non compliance w medication regimen   . Tobacco use     PSH:   Past Surgical History:  Procedure Laterality Date  . CHOLECYSTECTOMY    . CORONARY ANGIOPLASTY WITH STENT PLACEMENT  05/03/14   STEMI- stent to LAD DES- Xience alpine  . CORONARY ANGIOPLASTY WITH STENT PLACEMENT  05/09/14   STEMI- overlapping stent to LAD, pt had missed a dose of Brilinta  . LEFT HEART CATHETERIZATION WITH CORONARY ANGIOGRAM N/A 05/03/2014   Procedure: LEFT HEART CATHETERIZATION WITH CORONARY ANGIOGRAM;  Surgeon: Peter M Swaziland, MD;  Location: [facility] CATH LAB;  Service: Cardiovascular;  Laterality: N/A;  . LEFT HEART CATHETERIZATION WITH CORONARY ANGIOGRAM N/A 05/09/2014   Procedure: LEFT HEART CATHETERIZATION WITH CORONARY ANGIOGRAM;  Surgeon: Corky Crafts, MD;  Location: [facility] CATH LAB;  Service: Cardiovascular;  Laterality: N/A;  . PARTIAL HYSTERECTOMY    . PERCUTANEOUS STENT INTERVENTION  05/03/2014   Procedure: PERCUTANEOUS STENT INTERVENTION;  Surgeon: Peter M Swaziland, MD;  Location: [facility] CATH LAB;  Service: Cardiovascular;;  DES Prox LAD    Allergies:  Aspirin; Effient [prasugrel]; Lactose intolerance (gi); Robitussin dm [guaifenesin-dm]; Sulfa antibiotics; and Wheat bran Prior to Admit Meds:   Prescriptions Prior to Admission  Medication Sig Dispense Refill Last Dose  . benzonatate (TESSALON) 100 MG capsule Take 1 capsule (100 mg total) by mouth 3 (three) times daily as needed for cough. 20 capsule  0 Past Month at Unknown time  . carvedilol (COREG) 3.125 MG tablet Take 1 tablet (3.125 mg total) by mouth 2 (two) times daily with a meal. (Patient taking  differently: Take 3.125 mg by mouth every 14 (fourteen) days. ) 60 tablet 6 Past Month at Unknown time  . cetirizine (ZYRTEC) 10 MG tablet Take 10 mg by mouth daily as needed for allergies.   Past Month at Unknown time  . Cholecalciferol (VITAMIN D3 PO) Take 1 capsule by mouth every 14 (fourteen) days.   Past Month at Unknown time  . famotidine (PEPCID AC) 10 MG chewable tablet Chew 10 mg by mouth 2 (two) times daily as needed for heartburn.   Past Month at Unknown time  . furosemide (LASIX) 40 MG tablet Take 40 mg by mouth every other day.  2 06/25/2016 at Unknown time  . losartan (COZAAR) 50 MG tablet Take 50 mg by mouth daily.  3 06/25/2016 at Unknown time  . ondansetron (ZOFRAN ODT) 4 MG disintegrating tablet Take 1 tablet (4 mg total) by mouth every 8 (eight) hours as needed for nausea or vomiting. 10 tablet 0 Past Month at Unknown time  . potassium chloride (K-DUR) 10 MEQ tablet Take 10 mEq by mouth daily.  3 06/25/2016 at Unknown time  . atorvastatin (LIPITOR) 80 MG tablet Take 1 tablet (80 mg total) by mouth daily at 6 PM. (Patient not taking: Reported on 06/26/2016) 30 tablet 6 Not Taking at Unknown time  . hydrALAZINE (APRESOLINE) 25 MG tablet Take 1 tablet (25 mg total) by mouth 3 (three) times daily. (Patient not taking: Reported on 06/26/2016) 90 tablet 6 Not Taking at Unknown time  . isosorbide mononitrate (IMDUR) 30 MG 24 hr tablet Take 0.5 tablets (15 mg total) by mouth daily. (Patient not taking: Reported on 06/26/2016) 30 tablet 6 Not Taking at Unknown time  . lisinopril (PRINIVIL,ZESTRIL) 2.5 MG tablet Take 1 tablet (2.5 mg total) by mouth 2 (two) times daily. (Patient not taking: Reported on 06/26/2016) 60 tablet 6 Not Taking at Unknown time  . Magnesium Oxide 400 (240 Mg) MG TABS Take 400 mg by mouth daily. (Patient not taking: Reported on 06/26/2016) 30 tablet 3 Not Taking at Unknown time  . metolazone (ZAROXOLYN) 2.5 MG tablet Take 2.5 mg by mouth. As needed for 5 lb weight gain (Patient  not taking: Reported on 06/26/2016) 10 tablet 3 Not Taking at Unknown time  . nitroGLYCERIN (NITROSTAT) 0.4 MG SL tablet Place 0.4 mg under the tongue every 5 (five) minutes as needed for chest pain.   Unknown  . potassium chloride SA (K-DUR,KLOR-CON) 20 MEQ tablet Take 40 mEq (2 tablets) in every morning and 20 mEq (1 tablet) every afternoon. (Patient not taking: Reported on 06/26/2016) 100 tablet 6 Not Taking at Unknown time  . spironolactone (ALDACTONE) 25 MG tablet Take 1 tablet (25 mg total) by mouth daily. (Patient not taking: Reported on 06/26/2016) 30 tablet 6 Not Taking at Unknown time  . ticagrelor (BRILINTA) 60 MG TABS tablet Take 1 tablet (60 mg total) by mouth 2 (two) times daily. (Patient not taking: Reported on 06/26/2016) 60 tablet 6 Not Taking at Unknown time  . torsemide (DEMADEX) 20 MG tablet Take 2 tablets (40 mg total) by mouth 2 (two) times daily. (Patient not taking: Reported on 06/26/2016) 120 tablet 5 Not Taking at Unknown time   Fam HX:    Family History  Problem Relation Age of Onset  . Diabetes Mother   . Hypertension Mother  Social HX:    Social History   Social History  . Marital status: Widowed    Spouse name: N/A  . Number of children: N/A  . Years of education: N/A   Occupational History  . not employed    Social History Main Topics  . Smoking status: Current Some Day Smoker    Packs/day: 0.10    Years: 0.50    Types: Cigarettes  . Smokeless tobacco: Current User     Comment: down to 3 cigarettes daily (08/03/14)  . Alcohol use No  . Drug use: No  . Sexual activity: Not on file   Other Topics Concern  . Not on file   Social History Narrative   Patient has 6 brothers and sisters and none have known coronary artery disease. She lives alone in [place], but has several brothers in the area.     Review of Systems: Not always compliant with low-salt low volume diet. She denies tobacco use. Advocate's compliance with medical regimen.. All other  systems are negative.  Physical Exam: Blood pressure 132/82, pulse 81, temperature 98 F (36.7 C), temperature source Oral, resp. rate 20, height 5\' 5"  (1.651 m), weight 228 lb 1.6 oz (103.5 kg), SpO2 100 %. Weight change:    Obese, AA female, lying flat in bed in no distress. No significant JVD is noted although neck morphology makes assessment difficult. Decreased breath sounds bilaterally at the bases. Cardiac exam reveals an S3 gallop on auscultation. No murmur. Abdomen is distended. Bowel sounds are not palpable. Extremities reveal 1+ edema bilaterally. Neurological exam is intact.  Labs: Lab Results  Component Value Date   WBC 7.8 06/27/2016   HGB 13.5 06/27/2016   HCT 43.8 06/27/2016   MCV 90.3 06/27/2016   PLT 242 06/27/2016    Recent Labs Lab 06/27/16 0506  NA 142  K 3.2*  CL 106  CO2 23  BUN 18  CREATININE 1.41*  CALCIUM 9.2  GLUCOSE 122*   No results found for: PTT Lab Results  Component Value Date   INR 0.93 05/09/2014   INR 0.89 05/03/2014   Lab Results  Component Value Date   TROPONINI <0.03 06/26/2016     Lab Results  Component Value Date   CHOL 227 (H) 03/03/2016   CHOL 209 (H) 07/04/2014   CHOL 231 (H) 05/04/2014   Lab Results  Component Value Date   HDL 56 03/03/2016   HDL 48 07/04/2014   HDL 56 05/04/2014   Lab Results  Component Value Date   LDLCALC 142 (H) 03/03/2016   LDLCALC 127 (H) 07/04/2014   LDLCALC 151 (H) 05/04/2014   Lab Results  Component Value Date   TRIG 146 03/03/2016   TRIG 171 (H) 07/04/2014   TRIG 118 05/04/2014   Lab Results  Component Value Date   CHOLHDL 4.1 03/03/2016   CHOLHDL 4.4 07/04/2014   CHOLHDL 4.1 05/04/2014   No results found for: LDLDIRECT    Radiology:  Dg Chest 2 View  Result Date: 06/26/2016 CLINICAL DATA:  Pt reports from PCP for abdomen and leg swelling. Pt states she has gained 15 pounds since April 2017. Pt c/o SOB and intermittent productive cough x a few months. Pt has been on  two rounds of antibiotics for cough with no relief. Hx CHF and cardiac surgery. EXAM: CHEST  2 VIEW COMPARISON:  02/20/2016 FINDINGS: Cardiac silhouette is mildly enlarged. No mediastinal or hilar masses or evidence of adenopathy. Aorta is mildly uncoiled. Clear lungs.  No  pleural effusion or pneumothorax. Skeletal structures are intact. IMPRESSION: No acute cardiopulmonary disease.  Mild cardiomegaly. Electronically Signed   By: Amie Portland M.D.   On: 06/26/2016 15:25   Dg Abd 2 Views  Result Date: 06/26/2016 CLINICAL DATA:  Pt reports ongoing nausea, vomiting and weight gain. Pt states she has gained 15 pounds since April 2017. Pt denies constipation and diarrhea. EXAM: ABDOMEN - 2 VIEW COMPARISON:  Chest x-ray 06/26/2016 FINDINGS: There is paucity of bowel gas. The visualized bowel gas pattern is nonobstructive. No evidence for organomegaly. No abnormal calcifications. Visualized osseous structures have a normal appearance. IMPRESSION: No evidence for acute  abnormality. Electronically Signed   By: Norva Pavlov M.D.   On: 06/26/2016 18:55    EKG:  Normal sinus rhythm with anteroseptal Q waves. No acute ischemic change compared to prior.  Echocardiogram: 02/21/16 Study Conclusions  - Procedure narrative: Transthoracic echocardiography. Image   quality was adequate. The study was technically difficult. - Left ventricle: The cavity size was normal. Wall thickness was   increased in a pattern of mild LVH. Systolic function was   severely reduced. The estimated ejection fraction was 15%.   Diffuse hypokinesis. Doppler parameters are consistent with   restrictive physiology, indicative of decreased left ventricular   diastolic compliance and/or increased left atrial pressure.   Doppler parameters are consistent with high ventricular filling   pressure. - Mitral valve: Calcified annulus. There was mild regurgitation. - Left atrium: The atrium was moderately dilated. - Right ventricle:  Systolic function was moderately reduced. - Right atrium: The atrium was moderately dilated. - Tricuspid valve: There was severe regurgitation. - Pulmonary arteries: Systolic pressure was mildly increased. PA   peak pressure: 40 mm Hg (S). - Pericardium, extracardiac: There was a left pleural effusion.  Impressions:  - Severe global reduction in LV function; restrictive filling with   elevated filling pressure; moderate biatrial enlargement;   moderately reduced RV function; mild MR; severe TR; mildly   elevated pulmonary pressure.   Lyn Records III 06/27/2016 3:37 PM

## 2016-06-28 ENCOUNTER — Observation Stay (HOSPITAL_COMMUNITY): Payer: BLUE CROSS/BLUE SHIELD

## 2016-06-28 DIAGNOSIS — K219 Gastro-esophageal reflux disease without esophagitis: Secondary | ICD-10-CM | POA: Diagnosis present

## 2016-06-28 DIAGNOSIS — I130 Hypertensive heart and chronic kidney disease with heart failure and stage 1 through stage 4 chronic kidney disease, or unspecified chronic kidney disease: Secondary | ICD-10-CM | POA: Diagnosis present

## 2016-06-28 DIAGNOSIS — N183 Chronic kidney disease, stage 3 (moderate): Secondary | ICD-10-CM | POA: Diagnosis present

## 2016-06-28 DIAGNOSIS — Z955 Presence of coronary angioplasty implant and graft: Secondary | ICD-10-CM | POA: Diagnosis not present

## 2016-06-28 DIAGNOSIS — E785 Hyperlipidemia, unspecified: Secondary | ICD-10-CM | POA: Diagnosis present

## 2016-06-28 DIAGNOSIS — N179 Acute kidney failure, unspecified: Secondary | ICD-10-CM | POA: Diagnosis not present

## 2016-06-28 DIAGNOSIS — E871 Hypo-osmolality and hyponatremia: Secondary | ICD-10-CM | POA: Diagnosis not present

## 2016-06-28 DIAGNOSIS — I2510 Atherosclerotic heart disease of native coronary artery without angina pectoris: Secondary | ICD-10-CM | POA: Diagnosis present

## 2016-06-28 DIAGNOSIS — I252 Old myocardial infarction: Secondary | ICD-10-CM | POA: Diagnosis not present

## 2016-06-28 DIAGNOSIS — T502X5A Adverse effect of carbonic-anhydrase inhibitors, benzothiadiazides and other diuretics, initial encounter: Secondary | ICD-10-CM | POA: Diagnosis not present

## 2016-06-28 DIAGNOSIS — I5023 Acute on chronic systolic (congestive) heart failure: Secondary | ICD-10-CM

## 2016-06-28 DIAGNOSIS — R601 Generalized edema: Secondary | ICD-10-CM | POA: Diagnosis not present

## 2016-06-28 DIAGNOSIS — Z9119 Patient's noncompliance with other medical treatment and regimen: Secondary | ICD-10-CM | POA: Diagnosis not present

## 2016-06-28 DIAGNOSIS — R609 Edema, unspecified: Secondary | ICD-10-CM | POA: Diagnosis not present

## 2016-06-28 DIAGNOSIS — F17210 Nicotine dependence, cigarettes, uncomplicated: Secondary | ICD-10-CM | POA: Diagnosis present

## 2016-06-28 DIAGNOSIS — I255 Ischemic cardiomyopathy: Secondary | ICD-10-CM | POA: Diagnosis present

## 2016-06-28 DIAGNOSIS — R140 Abdominal distension (gaseous): Secondary | ICD-10-CM | POA: Diagnosis present

## 2016-06-28 DIAGNOSIS — I10 Essential (primary) hypertension: Secondary | ICD-10-CM | POA: Diagnosis not present

## 2016-06-28 DIAGNOSIS — Z66 Do not resuscitate: Secondary | ICD-10-CM | POA: Diagnosis present

## 2016-06-28 LAB — BASIC METABOLIC PANEL
Anion gap: 14 (ref 5–15)
BUN: 21 mg/dL — ABNORMAL HIGH (ref 6–20)
CO2: 25 mmol/L (ref 22–32)
Calcium: 8.8 mg/dL — ABNORMAL LOW (ref 8.9–10.3)
Chloride: 101 mmol/L (ref 101–111)
Creatinine, Ser: 1.34 mg/dL — ABNORMAL HIGH (ref 0.44–1.00)
GFR calc Af Amer: 47 mL/min — ABNORMAL LOW (ref >60)
GFR calc non Af Amer: 41 mL/min — ABNORMAL LOW (ref >60)
Glucose, Bld: 157 mg/dL — ABNORMAL HIGH (ref 65–99)
Potassium: 3.8 mmol/L (ref 3.5–5.1)
Sodium: 140 mmol/L (ref 135–145)

## 2016-06-28 MED ORDER — FUROSEMIDE 10 MG/ML IJ SOLN
80.0000 mg | Freq: Two times a day (BID) | INTRAMUSCULAR | Status: DC
  Administered 2016-06-28 – 2016-07-03 (×11): 80 mg via INTRAVENOUS
  Filled 2016-06-28 (×11): qty 8

## 2016-06-28 MED ORDER — TICAGRELOR 60 MG PO TABS
60.0000 mg | ORAL_TABLET | Freq: Two times a day (BID) | ORAL | Status: DC
  Administered 2016-06-28 – 2016-07-06 (×17): 60 mg via ORAL
  Filled 2016-06-28 (×17): qty 1

## 2016-06-28 MED ORDER — ATORVASTATIN CALCIUM 80 MG PO TABS
80.0000 mg | ORAL_TABLET | Freq: Every day | ORAL | Status: DC
  Administered 2016-06-28 – 2016-07-05 (×8): 80 mg via ORAL
  Filled 2016-06-28 (×8): qty 1

## 2016-06-28 MED ORDER — SPIRONOLACTONE 25 MG PO TABS
12.5000 mg | ORAL_TABLET | Freq: Every day | ORAL | Status: DC
Start: 2016-06-28 — End: 2016-06-30
  Administered 2016-06-28 – 2016-06-29 (×2): 12.5 mg via ORAL
  Filled 2016-06-28 (×2): qty 1

## 2016-06-28 NOTE — Progress Notes (Signed)
Patient ID: Deborah Ho, female   DOB: Nov 17, 1950, 65 y.o.   MRN: [id number]    SUBJECTIVE: She says that her breathing is ok.  Reasonable diuresis yesterday, still with a lot of leg swelling.  No chest pain.  Scheduled Meds: . atorvastatin  80 mg Oral q1800  . carvedilol  3.125 mg Oral BID WC  . furosemide  80 mg Intravenous BID  . heparin  5,000 Units Subcutaneous Q8H  . losartan  50 mg Oral Daily  . potassium chloride  10 mEq Oral Daily  . sodium chloride flush  3 mL Intravenous Q12H  . spironolactone  12.5 mg Oral Daily  . ticagrelor  60 mg Oral BID   Continuous Infusions:  PRN Meds:.sodium chloride, acetaminophen **OR** acetaminophen, famotidine, nitroGLYCERIN, ondansetron **OR** ondansetron (ZOFRAN) IV, polyethylene glycol, sodium chloride flush, traZODone    Vitals:   06/27/16 2000 06/28/16 0042 06/28/16 0355 06/28/16 1227  BP: 131/78 130/77 125/67 129/85  Pulse: 79 83 80 82  Resp: 20 18 18 20   Temp: 98.1 F (36.7 C) 97.4 F (36.3 C) 97.6 F (36.4 C) 98.1 F (36.7 C)  TempSrc: Oral Oral Oral Oral  SpO2: 99% 100% 98% 99%  Weight:   227 lb 6.4 oz (103.1 kg)   Height:        Intake/Output Summary (Last 24 hours) at 06/28/16 1254 Last data filed at 06/28/16 1228  Gross per 24 hour  Intake             1080 ml  Output             3000 ml  Net            -1920 ml    LABS: Basic Metabolic Panel:  Recent Labs  06/30/16 0506 06/28/16 0344  NA 142 140  K 3.2* 3.8  CL 106 101  CO2 23 25  GLUCOSE 122* 157*  BUN 18 21*  CREATININE 1.41* 1.34*  CALCIUM 9.2 8.8*   Liver Function Tests: No results for input(s): AST, ALT, ALKPHOS, BILITOT, PROT, ALBUMIN in the last 72 hours. No results for input(s): LIPASE, AMYLASE in the last 72 hours. CBC:  Recent Labs  06/26/16 1429 06/27/16 0506  WBC 8.4 7.8  NEUTROABS 5.5  --   HGB 14.8 13.5  HCT 48.7* 43.8  MCV 91.5 90.3  PLT 285 242   Cardiac Enzymes:  Recent Labs  06/26/16 1429  TROPONINI <0.03    BNP: Invalid input(s): POCBNP D-Dimer: No results for input(s): DDIMER in the last 72 hours. Hemoglobin A1C: No results for input(s): HGBA1C in the last 72 hours. Fasting Lipid Panel: No results for input(s): CHOL, HDL, LDLCALC, TRIG, CHOLHDL, LDLDIRECT in the last 72 hours. Thyroid Function Tests: No results for input(s): TSH, T4TOTAL, T3FREE, THYROIDAB in the last 72 hours.  Invalid input(s): FREET3 Anemia Panel: No results for input(s): VITAMINB12, FOLATE, FERRITIN, TIBC, IRON, RETICCTPCT in the last 72 hours.  RADIOLOGY: Dg Chest 2 View  Result Date: 06/26/2016 CLINICAL DATA:  Pt reports from PCP for abdomen and leg swelling. Pt states she has gained 15 pounds since April 2017. Pt c/o SOB and intermittent productive cough x a few months. Pt has been on two rounds of antibiotics for cough with no relief. Hx CHF and cardiac surgery. EXAM: CHEST  2 VIEW COMPARISON:  02/20/2016 FINDINGS: Cardiac silhouette is mildly enlarged. No mediastinal or hilar masses or evidence of adenopathy. Aorta is mildly uncoiled. Clear lungs.  No pleural effusion or pneumothorax. Skeletal structures  are intact. IMPRESSION: No acute cardiopulmonary disease.  Mild cardiomegaly. Electronically Signed   By: Amie Portland M.D.   On: 06/26/2016 15:25   Dg Abd 2 Views  Result Date: 06/26/2016 CLINICAL DATA:  Pt reports ongoing nausea, vomiting and weight gain. Pt states she has gained 15 pounds since April 2017. Pt denies constipation and diarrhea. EXAM: ABDOMEN - 2 VIEW COMPARISON:  Chest x-ray 06/26/2016 FINDINGS: There is paucity of bowel gas. The visualized bowel gas pattern is nonobstructive. No evidence for organomegaly. No abnormal calcifications. Visualized osseous structures have a normal appearance. IMPRESSION: No evidence for acute  abnormality. Electronically Signed   By: Norva Pavlov M.D.   On: 06/26/2016 18:55    PHYSICAL EXAM General: NAD Neck: JVP 14-16 cm, no thyromegaly or thyroid nodule.   Lungs: Clear to auscultation bilaterally with normal respiratory effort. CV: Lateral PMI.  Heart regular S1/S2, no S3/S4, 2/6 HSM LLSB.  2+ edema to thighs bilaterally.   Abdomen: Soft, nontender, no hepatosplenomegaly, mild distention.  Neurologic: Alert and oriented x 3.  Psych: Normal affect. Extremities: No clubbing or cyanosis.   TELEMETRY: Reviewed telemetry pt in NSR  ASSESSMENT AND PLAN: Deborah Ho is a 65 y.o. female with a history of HTN, HLD, tobacco abuse, CAD s/panterior STEMI with early stent thrombosis for missing doses of Brilinta s/p PCI x 2 into LAD (04/2014) and chronic systolic HF with EF 15% by 4/17 echo.  1. Acute on chronic systolic CHF: Ischemic cardiomyopathy.  Echo (4/17) with EF 15%, severe TR, moderate RV systolic dysfunction. She was admitted with marked volume overload in the setting of suspected medication noncompliance.  Says her PCP told her to stop torsemide and started her on Lasix.  She was taking Lasix every other day and her PCP "was trying to get me off the water pill."  She has very poor insight into her condition which severely impairs her care. She remains volume overloaded on exam.  - Increase Lasix to 80 mg IV bid.  - Continue low dose Coreg and losartan 50 mg daily. Consider switch to [facility] this admission.  - Add back spironolactone 12.5 mg daily.  - Was on hydralazine/Imdur at some point in the past, not sure how long she has been off it.  Can try to restart this admission. - Long talk about letting the cardiologists control her heart-related meds.  2. CAD:  S/panterior STEMI with early stent thrombosis for missing doses of Brilinta, s/p PCI x 2 into LAD (04/2014). She has had apparent angioedema with ASA.  She was supposed to be taking ticagrelor 60 mg bid for long-term, but apparently stopped it at some point.  Also off statin.  - Restart atorvastatin 80 daily and ticagrelor 60 bid.  3. Noncompliance: This is a significant issue.  Think  there is likely an underlying psychiatric diagnosis.  Would be good candidate for paramedicine if she would allow them in.   [name] 06/28/2016 1:01 PM

## 2016-06-28 NOTE — Progress Notes (Addendum)
[facility]  PROGRESS NOTE  Deborah Ho HQI:[id number] DOB: November 28, 1950 DOA: 06/26/2016 PCP: Grayce Sessions, NP    Brief HPI:   65 y.o. female  with hx CAD, CHF (EF 15%), HTN, HLD p/w significant weight gain and exasperation about her condition.  Weight has increased significantly over the past two weeks - she has gained weight in her abdomen and legs.  She is unable to walk well because she feels so heavy.  She feels she has been compliant with her regimen and diet, though she did cut back her furosemide to every third day instead of every other day, and eats take out food including occasional fried chicken or fish, pizza, and has a hot dog every Saturday.  She coughs at night time, productive of clear sputum - this is unchanged.  Denies known fevers, CP, SOB, abdominal pain.  Has soreness in her legs from increased fluid. She is feeling depressed because she keeps gaining fluid and she doesn't understand why.  She feels that she is doing everything she is supposed to be doing and is frustrated.  Denies SI.     Assessment/Plan:    1. Acute on chronic systolic CHF- echocardiogram showed EF 15%, continue Lasix 60 mg every 12 hours.Cardiology following.  Strict intake and output. Continue Aldactone. Net - 3.3 L. 2. Abdominal distention- KUB negative, no abdominal pain, nausea or vomiting. 3. Hypertension- blood pressure is stable, patient is off hydralazine. Continue Cozaar, Coreg 4. CKD stage III- patient's baseline creatinine 1.3, today creatinine 1.41. Will closely monitor BMP as patient is on Lasix   DVT prophylaxis: Heparin Code Status: Full code Family Communication: No family at bedside Disposition Plan: Home when medically stable   Consultants:  None   Procedures:  None   Antibiotics:  None   Subjective   Patient seen and examined, admitted with anasarca/fluid overload. Good diuresis with IV Lasix.  Objective    Objective: Vitals:   06/27/16 2000  06/28/16 0042 06/28/16 0355 06/28/16 1227  BP: 131/78 130/77 125/67 129/85  Pulse: 79 83 80 82  Resp: 20 18 18 20   Temp: 98.1 F (36.7 C) 97.4 F (36.3 C) 97.6 F (36.4 C) 98.1 F (36.7 C)  TempSrc: Oral Oral Oral Oral  SpO2: 99% 100% 98% 99%  Weight:   103.1 kg (227 lb 6.4 oz)   Height:        Intake/Output Summary (Last 24 hours) at 06/28/16 1251 Last data filed at 06/28/16 1228  Gross per 24 hour  Intake             1080 ml  Output             3000 ml  Net            -1920 ml   Filed Weights   06/26/16 2058 06/27/16 0546 06/28/16 0355  Weight: 104.2 kg (229 lb 12.8 oz) 103.5 kg (228 lb 1.6 oz) 103.1 kg (227 lb 6.4 oz)    Examination:  General exam: Appears calm and comfortable  Respiratory system: Clear to auscultation bilaterally. Respiratory effort normal. Cardiovascular system: S1 & S2 heard, RRR. No  murmurs, rubs, gallops or clicks. Bilateral edema in lower extremities Gastrointestinal system: Abdomen is nondistended, soft and nontender. No organomegaly or masses felt. Normal bowel sounds heard. Central nervous system: Alert and oriented. No focal neurological deficits. Extremities: Symmetric 5 x 5 power. Skin: No rashes, lesions or ulcers Psychiatry: Judgement and insight appear normal. Mood & affect appropriate.  Data Reviewed: I have personally reviewed following labs and imaging studies Basic Metabolic Panel:  Recent Labs Lab 06/26/16 1429 06/27/16 0506 06/28/16 0344  NA 140 142 140  K 3.9 3.2* 3.8  CL 106 106 101  CO2 22 23 25   GLUCOSE 93 122* 157*  BUN 17 18 21*  CREATININE 1.30* 1.41* 1.34*  CALCIUM 9.4 9.2 8.8*   CBC:  Recent Labs Lab 06/26/16 1429 06/27/16 0506  WBC 8.4 7.8  NEUTROABS 5.5  --   HGB 14.8 13.5  HCT 48.7* 43.8  MCV 91.5 90.3  PLT 285 242   Cardiac Enzymes:  Recent Labs Lab 06/26/16 1429  TROPONINI <0.03   BNP (last 3 results)  Recent Labs  09/02/15 1000 02/20/16 1124 06/26/16 1441  BNP 263.3* 1,564.1*  1,443.5*      Studies: Dg Chest 2 View  Result Date: 06/26/2016 CLINICAL DATA:  Pt reports from PCP for abdomen and leg swelling. Pt states she has gained 15 pounds since April 2017. Pt c/o SOB and intermittent productive cough x a few months. Pt has been on two rounds of antibiotics for cough with no relief. Hx CHF and cardiac surgery. EXAM: CHEST  2 VIEW COMPARISON:  02/20/2016 FINDINGS: Cardiac silhouette is mildly enlarged. No mediastinal or hilar masses or evidence of adenopathy. Aorta is mildly uncoiled. Clear lungs.  No pleural effusion or pneumothorax. Skeletal structures are intact. IMPRESSION: No acute cardiopulmonary disease.  Mild cardiomegaly. Electronically Signed   By: 04/21/2016 M.D.   On: 06/26/2016 15:25   Dg Abd 2 Views  Result Date: 06/26/2016 CLINICAL DATA:  Pt reports ongoing nausea, vomiting and weight gain. Pt states she has gained 15 pounds since April 2017. Pt denies constipation and diarrhea. EXAM: ABDOMEN - 2 VIEW COMPARISON:  Chest x-ray 06/26/2016 FINDINGS: There is paucity of bowel gas. The visualized bowel gas pattern is nonobstructive. No evidence for organomegaly. No abnormal calcifications. Visualized osseous structures have a normal appearance. IMPRESSION: No evidence for acute  abnormality. Electronically Signed   By: 08/26/2016 M.D.   On: 06/26/2016 18:55    Scheduled Meds: . atorvastatin  80 mg Oral q1800  . carvedilol  3.125 mg Oral BID WC  . furosemide  60 mg Intravenous BID  . heparin  5,000 Units Subcutaneous Q8H  . losartan  50 mg Oral Daily  . potassium chloride  10 mEq Oral Daily  . sodium chloride flush  3 mL Intravenous Q12H  . spironolactone  12.5 mg Oral Daily  . ticagrelor  60 mg Oral BID   Continuous Infusions:      Time spent: 25 min    [facility]  [facility] Pager [phone]. If 7PM-7AM, please contact night-coverage at [email or web address], Office  [phone]  password [email or web address] 06/28/2016, 12:51 PM  LOS: 0 days

## 2016-06-28 NOTE — Progress Notes (Signed)
VASCULAR LAB PRELIMINARY  ARTERIAL  ABI completed:    RIGHT    LEFT    PRESSURE WAVEFORM  PRESSURE WAVEFORM  BRACHIAL 142 T BRACHIAL  T  DP   DP    AT 156 T AT 171 T  PT 150 T PT 141 T  PER   PER    GREAT TOE  NA GREAT TOE  NA    RIGHT LEFT  ABI >1 >1     Enyla Lisbon, RVT 06/28/2016, 11:02 AM

## 2016-06-29 ENCOUNTER — Ambulatory Visit: Payer: Self-pay

## 2016-06-29 LAB — BASIC METABOLIC PANEL
Anion gap: 8 (ref 5–15)
BUN: 20 mg/dL (ref 6–20)
CO2: 28 mmol/L (ref 22–32)
Calcium: 9.3 mg/dL (ref 8.9–10.3)
Chloride: 101 mmol/L (ref 101–111)
Creatinine, Ser: 1.26 mg/dL — ABNORMAL HIGH (ref 0.44–1.00)
GFR calc Af Amer: 51 mL/min — ABNORMAL LOW (ref >60)
GFR calc non Af Amer: 44 mL/min — ABNORMAL LOW (ref >60)
Glucose, Bld: 92 mg/dL (ref 65–99)
Potassium: 3.8 mmol/L (ref 3.5–5.1)
Sodium: 137 mmol/L (ref 135–145)

## 2016-06-29 MED ORDER — ISOSORBIDE MONONITRATE ER 30 MG PO TB24
15.0000 mg | ORAL_TABLET | Freq: Every day | ORAL | Status: DC
Start: 2016-06-29 — End: 2016-06-30
  Administered 2016-06-29: 15 mg via ORAL
  Filled 2016-06-29: qty 1

## 2016-06-29 MED ORDER — HYDRALAZINE HCL 25 MG PO TABS
12.5000 mg | ORAL_TABLET | Freq: Three times a day (TID) | ORAL | Status: DC
  Administered 2016-06-29 – 2016-06-30 (×3): 12.5 mg via ORAL
  Filled 2016-06-29 (×3): qty 1

## 2016-06-29 NOTE — Progress Notes (Signed)
Deborah Ho is agreeable to restart Paramedicine home visits.  She tells me that her PCP advised her to take her diuretic every other day and begin to drink 3 L of hot water daily?  I had a long discussion again about fluid restriction and not changing any medications without discussing with the AHF Providers.  She acknowledges understanding.

## 2016-06-29 NOTE — Progress Notes (Signed)
[name] is very well known to me from previous admissions.  I have educated her multiple times regarding HF recommendations for home.  She was previously enrolled in the [employer] and she refused to continue at that time.  I will approach her to resume.  She has very limited insight into her health and is very difficult to reason with at times regarding HF self care.

## 2016-06-29 NOTE — Progress Notes (Signed)
[facility]  PROGRESS NOTE  Deborah Ho GHW:[id number] DOB: 03/19/1951 DOA: 06/26/2016 PCP: Grayce Sessions, NP    Brief HPI:   65 y.o. female  with hx CAD, CHF (EF 15%), HTN, HLD p/w significant weight gain and exasperation about her condition.  Weight has increased significantly over the past two weeks - she has gained weight in her abdomen and legs.  She is unable to walk well because she feels so heavy.  She feels she has been compliant with her regimen and diet, though she did cut back her furosemide to every third day instead of every other day, and eats take out food including occasional fried chicken or fish, pizza, and has a hot dog every Saturday.  She coughs at night time, productive of clear sputum - this is unchanged.  Denies known fevers, CP, SOB, abdominal pain.  Has soreness in her legs from increased fluid. She is feeling depressed because she keeps gaining fluid and she doesn't understand why.  She feels that she is doing everything she is supposed to be doing and is frustrated.  Denies SI.     Assessment/Plan:    1. Acute on chronic systolic CHF- echocardiogram showed EF 15%, continue Lasix 60 mg every 12 hours.Cardiology following.  Strict intake and output. Continue Aldactone. Net - 6.4 L. 2. Abdominal distention- KUB negative, no abdominal pain, nausea or vomiting. 3. Hypertension- blood pressure is stable, patient is off hydralazine. Continue Cozaar, Coreg 4. CKD stage III- patient's baseline creatinine 1.3, today creatinine 1.26. Will closely monitor BMP as patient is on Lasix   DVT prophylaxis: Heparin Code Status: Full code Family Communication: No family at bedside Disposition Plan: Home when medically stable   Consultants:  None   Procedures:  None   Antibiotics:  None   Subjective   Patient seen and examined, admitted with anasarca/fluid overload. Good diuresis with IV Lasix. Says that she still has fluid in her legs.  Objective     Objective: Vitals:   06/28/16 1957 06/29/16 0440 06/29/16 0858 06/29/16 1316  BP: 127/79 128/83 130/73 128/76  Pulse: 86 84 84 83  Resp: 18 18  18   Temp: 98.5 F (36.9 C) 98 F (36.7 C)  98 F (36.7 C)  TempSrc: Oral Oral  Oral  SpO2: 99% 99%  98%  Weight:  101.3 kg (223 lb 6.4 oz)    Height:        Intake/Output Summary (Last 24 hours) at 06/29/16 1358 Last data filed at 06/29/16 1316  Gross per 24 hour  Intake             1310 ml  Output             4675 ml  Net            -3365 ml   Filed Weights   06/27/16 0546 06/28/16 0355 06/29/16 0440  Weight: 103.5 kg (228 lb 1.6 oz) 103.1 kg (227 lb 6.4 oz) 101.3 kg (223 lb 6.4 oz)    Examination:  General exam: Appears calm and comfortable  Respiratory system: Clear to auscultation bilaterally. Respiratory effort normal. Cardiovascular system: S1 & S2 heard, RRR. No  murmurs, rubs, gallops or clicks. Bilateral 2+edema in lower extremities Gastrointestinal system: Abdomen is nondistended, soft and nontender. No organomegaly or masses felt. Normal bowel sounds heard. Central nervous system: Alert and oriented. No focal neurological deficits. Extremities: Symmetric 5 x 5 power. Skin: No rashes, lesions or ulcers Psychiatry: Judgement and insight appear normal.  Mood & affect appropriate.    Data Reviewed: I have personally reviewed following labs and imaging studies Basic Metabolic Panel:  Recent Labs Lab 06/26/16 1429 06/27/16 0506 06/28/16 0344 06/29/16 0443  NA 140 142 140 137  K 3.9 3.2* 3.8 3.8  CL 106 106 101 101  CO2 22 23 25 28   GLUCOSE 93 122* 157* 92  BUN 17 18 21* 20  CREATININE 1.30* 1.41* 1.34* 1.26*  CALCIUM 9.4 9.2 8.8* 9.3   CBC:  Recent Labs Lab 06/26/16 1429 06/27/16 0506  WBC 8.4 7.8  NEUTROABS 5.5  --   HGB 14.8 13.5  HCT 48.7* 43.8  MCV 91.5 90.3  PLT 285 242   Cardiac Enzymes:  Recent Labs Lab 06/26/16 1429  TROPONINI <0.03   BNP (last 3 results)  Recent Labs   09/02/15 1000 02/20/16 1124 06/26/16 1441  BNP 263.3* 1,564.1* 1,443.5*      Studies: No results found.  Scheduled Meds: . atorvastatin  80 mg Oral q1800  . carvedilol  3.125 mg Oral BID WC  . furosemide  80 mg Intravenous BID  . heparin  5,000 Units Subcutaneous Q8H  . hydrALAZINE  12.5 mg Oral Q8H  . isosorbide mononitrate  15 mg Oral Daily  . losartan  50 mg Oral Daily  . potassium chloride  10 mEq Oral Daily  . sodium chloride flush  3 mL Intravenous Q12H  . spironolactone  12.5 mg Oral Daily  . ticagrelor  60 mg Oral BID   Continuous Infusions:      Time spent: 25 min    [facility]  [facility] Pager [phone]. If 7PM-7AM, please contact night-coverage at [email or web address], Office  [phone]  password [email or web address] 06/29/2016, 1:58 PM  LOS: 1 day

## 2016-06-29 NOTE — Progress Notes (Signed)
RE: Benefit check  Anson Crofts CMA        S/W GLORIA @ [employer] [employer] # [phone] OPT- 1   ENTRESTO 49 MG / 51 MG BID (30) 60 TAB   COVER- YES  CO-PAY- ZERO DOLLARS ( IF APPROVED )  TIER- 4 DRUG  PRIOR APPROVAL - YES # [phone] OPT- 4 , 1  PHARMACY: [facility], [facility] AND [facility]

## 2016-06-29 NOTE — Care Management Note (Signed)
Case Management Note  Patient Details  Name: Deborah Ho MRN: [id number] Date of Birth: 12/07/1950  Subjective/Objective:        Admitted with CHF            Action/Plan: Patient well known to me from previous admission; Patient lives alone, PCP is Dr [name]; has private insurance with BCBS with prescription drug coverage; [facility] of choice [facility]; pt states no problem getting medication. CM talked to patient about " not taking medication." Patient stated "the doctors took me off of my medication." She has scales at home and states that she weighs herself daily and cooks a low sodium diet. Patient could benefit from a [facility] for Disease Management; she needs more education on medication and she is easily confused about how to take her medication. She was active with [facility] and would like to have them again. Referral placed as requested. Attending MD at discharge please enter the face to face for [facility] services.  Expected Discharge Date:   possibly 07/03/2016               Expected Discharge Plan:  Home w Home Health Services  Discharge planning Services  CM Consult     Choice offered to:  Patient  HH Arranged:  RN, Disease Management HH Agency:  [facility]  Status of Service:  In process, will continue to follow  Cherrie Distance RN ,MHA,BSN [phone] 06/29/2016, 11:13 AM

## 2016-06-29 NOTE — Progress Notes (Signed)
Patient ID: Deborah Ho, female   DOB: 1950/12/25, 65 y.o.   MRN: [id number]    SUBJECTIVE:   Feeling OK today. Remains SOB at times. She states she wouldn't have just "stopped her medicines." States they were changed at sometime. Not sure which provider or which appointment.   Out 2.7 L and down 4 lbs. Creatinine trending down. K stable.   Scheduled Meds: . atorvastatin  80 mg Oral q1800  . carvedilol  3.125 mg Oral BID WC  . furosemide  80 mg Intravenous BID  . heparin  5,000 Units Subcutaneous Q8H  . losartan  50 mg Oral Daily  . potassium chloride  10 mEq Oral Daily  . sodium chloride flush  3 mL Intravenous Q12H  . spironolactone  12.5 mg Oral Daily  . ticagrelor  60 mg Oral BID   Continuous Infusions:  PRN Meds:.sodium chloride, acetaminophen **OR** acetaminophen, famotidine, nitroGLYCERIN, ondansetron **OR** ondansetron (ZOFRAN) IV, polyethylene glycol, sodium chloride flush, traZODone    Vitals:   06/28/16 1227 06/28/16 1957 06/29/16 0440 06/29/16 0858  BP: 129/85 127/79 128/83 130/73  Pulse: 82 86 84 84  Resp: 20 18 18    Temp: 98.1 F (36.7 C) 98.5 F (36.9 C) 98 F (36.7 C)   TempSrc: Oral Oral Oral   SpO2: 99% 99% 99%   Weight:   223 lb 6.4 oz (101.3 kg)   Height:        Intake/Output Summary (Last 24 hours) at 06/29/16 0951 Last data filed at 06/29/16 0934  Gross per 24 hour  Intake             1080 ml  Output             3875 ml  Net            -2795 ml    LABS: Basic Metabolic Panel:  Recent Labs  07/01/16 0344 06/29/16 0443  NA 140 137  K 3.8 3.8  CL 101 101  CO2 25 28  GLUCOSE 157* 92  BUN 21* 20  CREATININE 1.34* 1.26*  CALCIUM 8.8* 9.3   Liver Function Tests: No results for input(s): AST, ALT, ALKPHOS, BILITOT, PROT, ALBUMIN in the last 72 hours. No results for input(s): LIPASE, AMYLASE in the last 72 hours. CBC:  Recent Labs  06/26/16 1429 06/27/16 0506  WBC 8.4 7.8  NEUTROABS 5.5  --   HGB 14.8 13.5  HCT 48.7* 43.8    MCV 91.5 90.3  PLT 285 242   Cardiac Enzymes:  Recent Labs  06/26/16 1429  TROPONINI <0.03   BNP: Invalid input(s): POCBNP D-Dimer: No results for input(s): DDIMER in the last 72 hours. Hemoglobin A1C: No results for input(s): HGBA1C in the last 72 hours. Fasting Lipid Panel: No results for input(s): CHOL, HDL, LDLCALC, TRIG, CHOLHDL, LDLDIRECT in the last 72 hours. Thyroid Function Tests: No results for input(s): TSH, T4TOTAL, T3FREE, THYROIDAB in the last 72 hours.  Invalid input(s): FREET3 Anemia Panel: No results for input(s): VITAMINB12, FOLATE, FERRITIN, TIBC, IRON, RETICCTPCT in the last 72 hours.  RADIOLOGY: Dg Chest 2 View  Result Date: 06/26/2016 CLINICAL DATA:  Pt reports from PCP for abdomen and leg swelling. Pt states she has gained 15 pounds since April 2017. Pt c/o SOB and intermittent productive cough x a few months. Pt has been on two rounds of antibiotics for cough with no relief. Hx CHF and cardiac surgery. EXAM: CHEST  2 VIEW COMPARISON:  02/20/2016 FINDINGS: Cardiac silhouette is mildly enlarged. No  mediastinal or hilar masses or evidence of adenopathy. Aorta is mildly uncoiled. Clear lungs.  No pleural effusion or pneumothorax. Skeletal structures are intact. IMPRESSION: No acute cardiopulmonary disease.  Mild cardiomegaly. Electronically Signed   By: Amie Portland M.D.   On: 06/26/2016 15:25   Dg Abd 2 Views  Result Date: 06/26/2016 CLINICAL DATA:  Pt reports ongoing nausea, vomiting and weight gain. Pt states she has gained 15 pounds since April 2017. Pt denies constipation and diarrhea. EXAM: ABDOMEN - 2 VIEW COMPARISON:  Chest x-ray 06/26/2016 FINDINGS: There is paucity of bowel gas. The visualized bowel gas pattern is nonobstructive. No evidence for organomegaly. No abnormal calcifications. Visualized osseous structures have a normal appearance. IMPRESSION: No evidence for acute  abnormality. Electronically Signed   By: Norva Pavlov M.D.   On:  06/26/2016 18:55    PHYSICAL EXAM General: NAD Neck: JVP remains elevated to 14-16 cm, no thyromegaly or thyroid nodule.  Lungs: CTAB, normal effort.  CV: Lateral PMI.  Heart regular S1/S2, no S3/S4, 2/6 HSM LLSB.  2+ edema to thighs bilaterally.   Abdomen: Soft, nontender, no hepatosplenomegaly, mild distention.  Neurologic: Alert and oriented x 3.  Psych: Normal affect. Extremities: No clubbing or cyanosis.   TELEMETRY: Reviewed personally, NSR 80s  ASSESSMENT AND PLAN: Deborah Ho is a 65 y.o. female with a history of HTN, HLD, tobacco abuse, CAD s/panterior STEMI with early stent thrombosis for missing doses of Brilinta s/p PCI x 2 into LAD (04/2014) and chronic systolic HF with EF 15% by 4/17 echo. She was admitted with marked volume overload in the setting of suspected medication noncompliance.Says her PCP told her to stop torsemide and started her on Lasix.  1. Acute on chronic systolic CHF: Ischemic cardiomyopathy.  Echo (4/17) with EF 15%, severe TR, moderate RV systolic dysfunction.     She was taking Lasix every other day and her PCP "was trying to get me off the water pill."  She has very poor insight into her condition which severely impairs her care.  - She remains significantly volume overloaded on exam.   - Continue Lasix 80 mg IV bid, will give 2.5 mg metolazone today with extra K.  - Continue low dose Coreg and losartan 50 mg daily. Could consider Entresto this admission, but likely poor option with her ongoing non-compliance + cost.  - Continue spironolactone 12.5 mg daily.  - Add back hydralazine 12.5 mg TID and Imdur 15 mg daily.  - Long talk about letting the cardiologists control her heart-related meds.  2. CAD:  S/panterior STEMI with early stent thrombosis for missing doses of Brilinta, s/p PCI x 2 into LAD (04/2014). - She has had apparent angioedema with ASA.  - Continue atorvastatin 80 mg daily and ticagrelor 60 mg BID. May be difficult to get her to continue  as outpatient (had previously stopped on her own volition)  3. Noncompliance:  - Remains a significant issue.  She refuses paramedicine.  - Think there is likely an underlying psychiatric diagnosis.   - Again discussed importance of cardiac meds and compliance.   Deborah Freer, PA-C 06/29/2016 9:51 AM   Advanced Heart Failure Team Pager [phone] (M-F; 7a - 4p)  Please contact [facility] for night-coverage after hours (4p -7a ) and weekends on [email or web address]  Patient seen with PA, agree with the above note.  She remains volume overloaded but diuresing well.   - Add metolazone dose today.  - Start hydralazine/Imdur as above.  - Needs a  lot of discussion regarding medication compliance.  She now agrees to paramedicine followup.    Deborah Ho 06/29/2016 3:31 PM

## 2016-06-29 NOTE — Progress Notes (Signed)
Pt a/o, no c/o pain,VSS pt stable 

## 2016-06-30 LAB — BASIC METABOLIC PANEL
Anion gap: 11 (ref 5–15)
BUN: 21 mg/dL — ABNORMAL HIGH (ref 6–20)
CO2: 28 mmol/L (ref 22–32)
Calcium: 9.1 mg/dL (ref 8.9–10.3)
Chloride: 100 mmol/L — ABNORMAL LOW (ref 101–111)
Creatinine, Ser: 1.3 mg/dL — ABNORMAL HIGH (ref 0.44–1.00)
GFR calc Af Amer: 49 mL/min — ABNORMAL LOW (ref >60)
GFR calc non Af Amer: 42 mL/min — ABNORMAL LOW (ref >60)
Glucose, Bld: 108 mg/dL — ABNORMAL HIGH (ref 65–99)
Potassium: 3.7 mmol/L (ref 3.5–5.1)
Sodium: 139 mmol/L (ref 135–145)

## 2016-06-30 LAB — MAGNESIUM: Magnesium: 1.8 mg/dL (ref 1.7–2.4)

## 2016-06-30 MED ORDER — MAGNESIUM SULFATE 2 GM/50ML IV SOLN
2.0000 g | Freq: Once | INTRAVENOUS | Status: AC
  Administered 2016-06-30: 2 g via INTRAVENOUS
  Filled 2016-06-30: qty 50

## 2016-06-30 MED ORDER — SPIRONOLACTONE 25 MG PO TABS
25.0000 mg | ORAL_TABLET | Freq: Every day | ORAL | Status: DC
  Administered 2016-06-30 – 2016-07-06 (×7): 25 mg via ORAL
  Filled 2016-06-30 (×7): qty 1

## 2016-06-30 MED ORDER — METOLAZONE 2.5 MG PO TABS
2.5000 mg | ORAL_TABLET | Freq: Once | ORAL | Status: AC
  Administered 2016-06-30: 2.5 mg via ORAL
  Filled 2016-06-30: qty 1

## 2016-06-30 MED ORDER — ISOSORBIDE MONONITRATE ER 30 MG PO TB24
30.0000 mg | ORAL_TABLET | Freq: Every day | ORAL | Status: DC
  Administered 2016-06-30 – 2016-07-06 (×7): 30 mg via ORAL
  Filled 2016-06-30 (×7): qty 1

## 2016-06-30 MED ORDER — HYDRALAZINE HCL 25 MG PO TABS
25.0000 mg | ORAL_TABLET | Freq: Three times a day (TID) | ORAL | Status: DC
  Administered 2016-06-30 – 2016-07-03 (×11): 25 mg via ORAL
  Filled 2016-06-30 (×13): qty 1

## 2016-06-30 NOTE — Progress Notes (Signed)
Patient ID: Deborah Ho, female   DOB: 1951-01-19, 65 y.o.   MRN: [id number]    SUBJECTIVE:   Feeling better this morning. Still perseverates on drinking warm fluids. Breathing improved.   Out 1.9 L and down 6 lbs. Creatinine relatively stable. K 3.7.    Scheduled Meds: . atorvastatin  80 mg Oral q1800  . carvedilol  3.125 mg Oral BID WC  . furosemide  80 mg Intravenous BID  . heparin  5,000 Units Subcutaneous Q8H  . hydrALAZINE  12.5 mg Oral Q8H  . isosorbide mononitrate  15 mg Oral Daily  . losartan  50 mg Oral Daily  . potassium chloride  10 mEq Oral Daily  . sodium chloride flush  3 mL Intravenous Q12H  . spironolactone  12.5 mg Oral Daily  . ticagrelor  60 mg Oral BID   Continuous Infusions:  PRN Meds:.sodium chloride, acetaminophen **OR** acetaminophen, famotidine, nitroGLYCERIN, ondansetron **OR** ondansetron (ZOFRAN) IV, polyethylene glycol, sodium chloride flush, traZODone    Vitals:   06/29/16 0858 06/29/16 1316 06/29/16 2226 06/30/16 0607  BP: 130/73 128/76 128/62 131/83  Pulse: 84 83 82 81  Resp:  18 18 18   Temp:  98 F (36.7 C) 98.1 F (36.7 C) 97.9 F (36.6 C)  TempSrc:  Oral Oral Oral  SpO2:  98% 100% 100%  Weight:    217 lb 4.8 oz (98.6 kg)  Height:        Intake/Output Summary (Last 24 hours) at 06/30/16 0754 Last data filed at 06/30/16 6415  Gross per 24 hour  Intake             1680 ml  Output             3300 ml  Net            -1620 ml    LABS: Basic Metabolic Panel:  Recent Labs  83/09/40 0443 06/30/16 0320  NA 137 139  K 3.8 3.7  CL 101 100*  CO2 28 28  GLUCOSE 92 108*  BUN 20 21*  CREATININE 1.26* 1.30*  CALCIUM 9.3 9.1  MG  --  1.8   Liver Function Tests: No results for input(s): AST, ALT, ALKPHOS, BILITOT, PROT, ALBUMIN in the last 72 hours. No results for input(s): LIPASE, AMYLASE in the last 72 hours. CBC: No results for input(s): WBC, NEUTROABS, HGB, HCT, MCV, PLT in the last 72 hours. Cardiac Enzymes: No results  for input(s): CKTOTAL, CKMB, CKMBINDEX, TROPONINI in the last 72 hours. BNP: Invalid input(s): POCBNP D-Dimer: No results for input(s): DDIMER in the last 72 hours. Hemoglobin A1C: No results for input(s): HGBA1C in the last 72 hours. Fasting Lipid Panel: No results for input(s): CHOL, HDL, LDLCALC, TRIG, CHOLHDL, LDLDIRECT in the last 72 hours. Thyroid Function Tests: No results for input(s): TSH, T4TOTAL, T3FREE, THYROIDAB in the last 72 hours.  Invalid input(s): FREET3 Anemia Panel: No results for input(s): VITAMINB12, FOLATE, FERRITIN, TIBC, IRON, RETICCTPCT in the last 72 hours.  RADIOLOGY: Dg Chest 2 View  Result Date: 06/26/2016 CLINICAL DATA:  Pt reports from PCP for abdomen and leg swelling. Pt states she has gained 15 pounds since April 2017. Pt c/o SOB and intermittent productive cough x a few months. Pt has been on two rounds of antibiotics for cough with no relief. Hx CHF and cardiac surgery. EXAM: CHEST  2 VIEW COMPARISON:  02/20/2016 FINDINGS: Cardiac silhouette is mildly enlarged. No mediastinal or hilar masses or evidence of adenopathy. Aorta is mildly uncoiled. Clear  lungs.  No pleural effusion or pneumothorax. Skeletal structures are intact. IMPRESSION: No acute cardiopulmonary disease.  Mild cardiomegaly. Electronically Signed   By: Amie Portland M.D.   On: 06/26/2016 15:25   Dg Abd 2 Views  Result Date: 06/26/2016 CLINICAL DATA:  Pt reports ongoing nausea, vomiting and weight gain. Pt states she has gained 15 pounds since April 2017. Pt denies constipation and diarrhea. EXAM: ABDOMEN - 2 VIEW COMPARISON:  Chest x-ray 06/26/2016 FINDINGS: There is paucity of bowel gas. The visualized bowel gas pattern is nonobstructive. No evidence for organomegaly. No abnormal calcifications. Visualized osseous structures have a normal appearance. IMPRESSION: No evidence for acute  abnormality. Electronically Signed   By: Norva Pavlov M.D.   On: 06/26/2016 18:55    PHYSICAL  EXAM General: NAD Neck: JVP remains elevated to 11-12 cm, no thyromegaly or thyroid nodule.  Lungs: Clear CV: Lateral PMI.  Heart regular S1/S2, no S3/S4, 2/6 HSM LLSB.  1+ edema to thighs bilaterally.   Abdomen: Soft, NT, ND, no HSM. No bruits or masses. +BS  Neurologic: Alert and oriented x 3.  Psych: Normal affect. Extremities: No clubbing or cyanosis.   TELEMETRY: Reviewed personally, NSR 80s  ASSESSMENT AND PLAN: Deborah Ho is a 65 y.o. female with a history of HTN, HLD, tobacco abuse, CAD s/panterior STEMI with early stent thrombosis for missing doses of Brilinta s/p PCI x 2 into LAD (04/2014) and chronic systolic HF with EF 15% by 4/17 echo. She was admitted with marked volume overload in the setting of suspected medication noncompliance.Says her PCP told her to stop torsemide and started her on Lasix.  1. Acute on chronic systolic CHF: Ischemic cardiomyopathy.  Echo (4/17) with EF 15%, severe TR, moderate RV systolic dysfunction.     She was taking Lasix every other day and her PCP "was trying to get me off the water pill."  She has very poor insight into her condition which severely impairs her care.  - She remains significantly volume overloaded on exam.   - Continue Lasix 80 mg IV bid. Had good diuresis with metolazone.  Will repeat today with extra K supp.   - Continue low dose Coreg and losartan 50 mg daily. Could consider Entresto this admission, but likely poor option with her ongoing non-compliance + cost.  - Increase spironolactone 25 mg daily.  - Increase hydralazine 25 mg TID and Imdur 30 mg daily.  - Long talk about letting the cardiologists control her heart-related meds.  2. CAD:  S/panterior STEMI with early stent thrombosis for missing doses of Brilinta, s/p PCI x 2 into LAD (04/2014). - She has had apparent angioedema with ASA.  - Continue atorvastatin 80 mg daily and ticagrelor 60 mg BID. May be difficult to get her to continue as outpatient (had previously  stopped on her own volition)  3. Noncompliance:  - Remains a significant issue.  She refuses paramedicine.  - Think there is likely an underlying psychiatric diagnosis.   - Again discussed importance of cardiac meds and compliance.   Deborah Freer, PA-C 06/30/2016 7:54 AM   Advanced Heart Failure Team Pager [phone] (M-F; 7a - 4p)  Please contact [facility] for night-coverage after hours (4p -7a ) and weekends on [email or web address]  Patient seen with PA, agree with the above note.  She diuresed well yesterday but still volume overloaded.  Give metolazone again today.  Increase hydralazine to 25 tid and Imdur to 30 daily.  Increase spironolactone to 25 daily.  Will  need to check with care management to see if she could get coverage for Bidil at home.   [name] 06/30/2016 8:50 AM

## 2016-06-30 NOTE — Progress Notes (Signed)
Patient very upset because bed alarm was turned on, she requested for the bed alarm to be turned off.

## 2016-06-30 NOTE — Progress Notes (Signed)
[facility]  PROGRESS NOTE  Deborah Ho BPZ:[id number] DOB: May 11, 1951 DOA: 06/26/2016 PCP: Grayce Sessions, NP    Brief HPI:   65 y.o. female  with hx CAD, CHF (EF 15%), HTN, HLD p/w significant weight gain and exasperation about her condition.  Weight has increased significantly over the past two weeks - she has gained weight in her abdomen and legs.  She is unable to walk well because she feels so heavy.  She feels she has been compliant with her regimen and diet, though she did cut back her furosemide to every third day instead of every other day, and eats take out food including occasional fried chicken or fish, pizza, and has a hot dog every Saturday.  She coughs at night time, productive of clear sputum - this is unchanged.  Denies known fevers, CP, SOB, abdominal pain.  Has soreness in her legs from increased fluid. She is feeling depressed because she keeps gaining fluid and she doesn't understand why.  She feels that she is doing everything she is supposed to be doing and is frustrated.  Denies SI.     Assessment/Plan:    1. Acute on chronic systolic CHF- echocardiogram showed EF 15%, continue Lasix 80 mg every 12 hours.Cardiology following.  Strict intake and output. Continue Aldactone. Net - 7.5 L. 2. Abdominal distention- KUB negative, no abdominal pain, nausea or vomiting. 3. Hypertension- blood pressure is stable, continue Cozaar, Coreg, Hydralazine 4. CKD stage III- patient's baseline creatinine 1.3, today creatinine 1.30. Will closely monitor BMP as patient is on Lasix   DVT prophylaxis: Heparin Code Status: Full code Family Communication: No family at bedside Disposition Plan: Home when medically stable   Consultants:  None   Procedures:  None   Antibiotics:  None   Subjective   Patient seen and examined, admitted with anasarca/fluid overload. Good diuresis with IV Lasix. Breathing has improved, still has pedal edema.  Objective     Objective: Vitals:   06/29/16 1316 06/29/16 2226 06/30/16 0607 06/30/16 1131  BP: 128/76 128/62 131/83 128/78  Pulse: 83 82 81 81  Resp: 18 18 18 18   Temp: 98 F (36.7 C) 98.1 F (36.7 C) 97.9 F (36.6 C) 97.4 F (36.3 C)  TempSrc: Oral Oral Oral Oral  SpO2: 98% 100% 100% 100%  Weight:   98.6 kg (217 lb 4.8 oz)   Height:        Intake/Output Summary (Last 24 hours) at 06/30/16 1402 Last data filed at 06/30/16 1134  Gross per 24 hour  Intake             1210 ml  Output             2300 ml  Net            -1090 ml   Filed Weights   06/28/16 0355 06/29/16 0440 06/30/16 0607  Weight: 103.1 kg (227 lb 6.4 oz) 101.3 kg (223 lb 6.4 oz) 98.6 kg (217 lb 4.8 oz)    Examination:  General exam: Appears calm and comfortable  Respiratory system: Clear to auscultation bilaterally. Respiratory effort normal. Cardiovascular system: S1 & S2 heard, RRR. No  murmurs, rubs, gallops or clicks. Bilateral 2+edema in lower extremities Gastrointestinal system: Abdomen is nondistended, soft and nontender. No organomegaly or masses felt. Normal bowel sounds heard. Central nervous system: Alert and oriented. No focal neurological deficits. Extremities: Symmetric 5 x 5 power. Skin: No rashes, lesions or ulcers Psychiatry: Judgement and insight appear normal. Mood &  affect appropriate.    Data Reviewed: I have personally reviewed following labs and imaging studies Basic Metabolic Panel:  Recent Labs Lab 06/26/16 1429 06/27/16 0506 06/28/16 0344 06/29/16 0443 06/30/16 0320  NA 140 142 140 137 139  K 3.9 3.2* 3.8 3.8 3.7  CL 106 106 101 101 100*  CO2 22 23 25 28 28   GLUCOSE 93 122* 157* 92 108*  BUN 17 18 21* 20 21*  CREATININE 1.30* 1.41* 1.34* 1.26* 1.30*  CALCIUM 9.4 9.2 8.8* 9.3 9.1  MG  --   --   --   --  1.8   CBC:  Recent Labs Lab 06/26/16 1429 06/27/16 0506  WBC 8.4 7.8  NEUTROABS 5.5  --   HGB 14.8 13.5  HCT 48.7* 43.8  MCV 91.5 90.3  PLT 285 242   Cardiac  Enzymes:  Recent Labs Lab 06/26/16 1429  TROPONINI <0.03   BNP (last 3 results)  Recent Labs  09/02/15 1000 02/20/16 1124 06/26/16 1441  BNP 263.3* 1,564.1* 1,443.5*      Studies: No results found.  Scheduled Meds: . atorvastatin  80 mg Oral q1800  . carvedilol  3.125 mg Oral BID WC  . furosemide  80 mg Intravenous BID  . heparin  5,000 Units Subcutaneous Q8H  . hydrALAZINE  25 mg Oral Q8H  . isosorbide mononitrate  30 mg Oral Daily  . losartan  50 mg Oral Daily  . magnesium sulfate 1 - 4 g bolus IVPB  2 g Intravenous Once  . potassium chloride  10 mEq Oral Daily  . sodium chloride flush  3 mL Intravenous Q12H  . spironolactone  25 mg Oral Daily  . ticagrelor  60 mg Oral BID   Continuous Infusions:      Time spent: 25 min    [facility]  [facility] Pager [phone]. If 7PM-7AM, please contact night-coverage at [email or web address], Office  [phone]  password [email or web address] 06/30/2016, 2:02 PM  LOS: 2 days

## 2016-07-01 DIAGNOSIS — E785 Hyperlipidemia, unspecified: Secondary | ICD-10-CM

## 2016-07-01 DIAGNOSIS — R140 Abdominal distension (gaseous): Secondary | ICD-10-CM

## 2016-07-01 LAB — BASIC METABOLIC PANEL
Anion gap: 11 (ref 5–15)
BUN: 22 mg/dL — ABNORMAL HIGH (ref 6–20)
CO2: 31 mmol/L (ref 22–32)
Calcium: 9.6 mg/dL (ref 8.9–10.3)
Chloride: 96 mmol/L — ABNORMAL LOW (ref 101–111)
Creatinine, Ser: 1.33 mg/dL — ABNORMAL HIGH (ref 0.44–1.00)
GFR calc Af Amer: 48 mL/min — ABNORMAL LOW (ref >60)
GFR calc non Af Amer: 41 mL/min — ABNORMAL LOW (ref >60)
Glucose, Bld: 89 mg/dL (ref 65–99)
Potassium: 3.6 mmol/L (ref 3.5–5.1)
Sodium: 138 mmol/L (ref 135–145)

## 2016-07-01 MED ORDER — POTASSIUM CHLORIDE CRYS ER 20 MEQ PO TBCR
40.0000 meq | EXTENDED_RELEASE_TABLET | Freq: Once | ORAL | Status: AC
  Administered 2016-07-01: 40 meq via ORAL
  Filled 2016-07-01: qty 2

## 2016-07-01 MED ORDER — METOLAZONE 5 MG PO TABS
5.0000 mg | ORAL_TABLET | Freq: Once | ORAL | Status: AC
  Administered 2016-07-01: 5 mg via ORAL
  Filled 2016-07-01: qty 1

## 2016-07-01 NOTE — Progress Notes (Signed)
Patient ID: Deborah Ho, female   DOB: 11-08-1951, 65 y.o.   MRN: [id number]    SUBJECTIVE:   Continue to improve. Thighs no longer tender.  RLE more tender than left. Both remain "tight and swollen". Stating she can "feel the fluid leaving her body" in various stages...   Out 1.5 L. No weight yet this am.  Creatinine stable. K 3.6.    Scheduled Meds: . atorvastatin  80 mg Oral q1800  . carvedilol  3.125 mg Oral BID WC  . furosemide  80 mg Intravenous BID  . heparin  5,000 Units Subcutaneous Q8H  . hydrALAZINE  25 mg Oral Q8H  . isosorbide mononitrate  30 mg Oral Daily  . losartan  50 mg Oral Daily  . potassium chloride  10 mEq Oral Daily  . sodium chloride flush  3 mL Intravenous Q12H  . spironolactone  25 mg Oral Daily  . ticagrelor  60 mg Oral BID   Continuous Infusions:  PRN Meds:.sodium chloride, acetaminophen **OR** acetaminophen, famotidine, nitroGLYCERIN, ondansetron **OR** ondansetron (ZOFRAN) IV, polyethylene glycol, sodium chloride flush, traZODone    Vitals:   06/30/16 0607 06/30/16 1131 06/30/16 2015 07/01/16 0453  BP: 131/83 128/78 118/76 118/84  Pulse: 81 81 86 81  Resp: 18 18 20 18   Temp: 97.9 F (36.6 C) 97.4 F (36.3 C) 98.6 F (37 C) 97.8 F (36.6 C)  TempSrc: Oral Oral Oral Oral  SpO2: 100% 100% 96% 99%  Weight: 217 lb 4.8 oz (98.6 kg)     Height:        Intake/Output Summary (Last 24 hours) at 07/01/16 1105 Last data filed at 07/01/16 1026  Gross per 24 hour  Intake             1550 ml  Output             4500 ml  Net            -2950 ml    LABS: Basic Metabolic Panel:  Recent Labs  14/38/88 0320 07/01/16 0453  NA 139 138  K 3.7 3.6  CL 100* 96*  CO2 28 31  GLUCOSE 108* 89  BUN 21* 22*  CREATININE 1.30* 1.33*  CALCIUM 9.1 9.6  MG 1.8  --    Liver Function Tests: No results for input(s): AST, ALT, ALKPHOS, BILITOT, PROT, ALBUMIN in the last 72 hours. No results for input(s): LIPASE, AMYLASE in the last 72 hours. CBC: No  results for input(s): WBC, NEUTROABS, HGB, HCT, MCV, PLT in the last 72 hours. Cardiac Enzymes: No results for input(s): CKTOTAL, CKMB, CKMBINDEX, TROPONINI in the last 72 hours. BNP: Invalid input(s): POCBNP D-Dimer: No results for input(s): DDIMER in the last 72 hours. Hemoglobin A1C: No results for input(s): HGBA1C in the last 72 hours. Fasting Lipid Panel: No results for input(s): CHOL, HDL, LDLCALC, TRIG, CHOLHDL, LDLDIRECT in the last 72 hours. Thyroid Function Tests: No results for input(s): TSH, T4TOTAL, T3FREE, THYROIDAB in the last 72 hours.  Invalid input(s): FREET3 Anemia Panel: No results for input(s): VITAMINB12, FOLATE, FERRITIN, TIBC, IRON, RETICCTPCT in the last 72 hours.  RADIOLOGY: Dg Chest 2 View  Result Date: 06/26/2016 CLINICAL DATA:  Pt reports from PCP for abdomen and leg swelling. Pt states she has gained 15 pounds since April 2017. Pt c/o SOB and intermittent productive cough x a few months. Pt has been on two rounds of antibiotics for cough with no relief. Hx CHF and cardiac surgery. EXAM: CHEST  2 VIEW COMPARISON:  02/20/2016 FINDINGS: Cardiac silhouette is mildly enlarged. No mediastinal or hilar masses or evidence of adenopathy. Aorta is mildly uncoiled. Clear lungs.  No pleural effusion or pneumothorax. Skeletal structures are intact. IMPRESSION: No acute cardiopulmonary disease.  Mild cardiomegaly. Electronically Signed   By: Amie Portland M.D.   On: 06/26/2016 15:25   Dg Abd 2 Views  Result Date: 06/26/2016 CLINICAL DATA:  Pt reports ongoing nausea, vomiting and weight gain. Pt states she has gained 15 pounds since April 2017. Pt denies constipation and diarrhea. EXAM: ABDOMEN - 2 VIEW COMPARISON:  Chest x-ray 06/26/2016 FINDINGS: There is paucity of bowel gas. The visualized bowel gas pattern is nonobstructive. No evidence for organomegaly. No abnormal calcifications. Visualized osseous structures have a normal appearance. IMPRESSION: No evidence for acute   abnormality. Electronically Signed   By: Norva Pavlov M.D.   On: 06/26/2016 18:55    PHYSICAL EXAM General: NAD Neck: JVP remains elevated to 8-9 cm, no thyromegaly or thyroid nodule.  Lungs: Clear CV: Lateral PMI.  Heart regular S1/S2, no S3/S4, 2/6 HSM LLSB.  Trace - 1+ edema to thighs bilaterally.   Abdomen: Soft, NT, ND, no HSM. No bruits or masses. +BS  Neurologic: Alert and oriented x 3.  Psych: Normal affect. Extremities: No clubbing or cyanosis.   TELEMETRY: Reviewed personally, NSR 80s  ASSESSMENT AND PLAN: Deborah Ho is a 65 y.o. female with a history of HTN, HLD, tobacco abuse, CAD s/panterior STEMI with early stent thrombosis for missing doses of Brilinta s/p PCI x 2 into LAD (04/2014) and chronic systolic HF with EF 15% by 4/17 echo. She was admitted with marked volume overload in the setting of suspected medication noncompliance.Says her PCP told her to stop torsemide and started her on Lasix.  1. Acute on chronic systolic CHF: Ischemic cardiomyopathy.  Echo (4/17) with EF 15%, severe TR, moderate RV systolic dysfunction.     She was taking Lasix every other day and her PCP "was trying to get me off the water pill."  She has very poor insight into her condition which severely impairs her care.  - She remains volume overloaded, but is improving.    - Continue Lasix 80 mg IV BID. Will give 5 mg metolazone today with 40 meq K.  - Apply ted hose.  - Continue low dose Coreg and losartan 50 mg daily. Switch to [facility] tomorrow. - Continue spironolactone 25 mg daily.  - Continue hydralazine 25 mg TID and Imdur 30 mg daily.  BP stable in 110s.   - Long talk this admission about letting the cardiologists control her heart-related meds.  2. CAD:  S/panterior STEMI with early stent thrombosis for missing doses of Brilinta, s/p PCI x 2 into LAD (04/2014). - She has had apparent angioedema with ASA.  - Continue atorvastatin 80 mg daily and ticagrelor 60 mg BID. May be difficult  to get her to continue as outpatient (had previously stopped on her own volition)  3. Noncompliance:  - Remains a significant issue.  She refuses paramedicine.  - Think there is likely an underlying psychiatric diagnosis.   - Again discussed importance of cardiac meds and compliance.  - Will aim to keep her regimen as straightforward as possible.  Continue IV diuresis today.   Graciella Freer, PA-C 07/01/2016 11:05 AM   Advanced Heart Failure Team Pager [phone] (M-F; 7a - 4p)  Please contact [facility] for night-coverage after hours (4p -7a ) and weekends on [email or web address]  Patient seen with PA,  agree with the above note.  She continues to diurese well but is still volume overloaded. Continue current IV Lasix + metolazone today.   Stop losartan, start Entresto 24/26 bid tomorrow. Continue other meds as currently ordered.  [name] 07/01/2016 1:49 PM

## 2016-07-01 NOTE — Progress Notes (Signed)
Pt educated about importance of wearing TEDS as ordered; however, pt refuses.

## 2016-07-01 NOTE — Progress Notes (Signed)
[facility]  PROGRESS NOTE  Deborah Ho DEY:[id number] DOB: 11/04/1951 DOA: 06/26/2016 PCP: Grayce Sessions, NP   Brief HPI:   65 y.o. female  with hx CAD, CHF (EF 15%), HTN, HLD p/w significant weight gain and exasperation about her condition.  Weight has increased significantly over the past two weeks - she has gained weight in her abdomen and legs.  She is unable to walk well because she feels so heavy.  She feels she has been compliant with her regimen and diet, though she did cut back her furosemide to every third day instead of every other day, and eats take out food including occasional fried chicken or fish, pizza, and has a hot dog every Saturday.  She coughs at night time, productive of clear sputum - this is unchanged.  Denies known fevers, CP, SOB, abdominal pain.  Has soreness in her legs from increased fluid.  She is feeling depressed because she keeps gaining fluid and she doesn't understand why.  She feels that she is doing everything she is supposed to be doing and is frustrated.  Denies SI.     Assessment/Plan:    1. Acute on chronic systolic CHF- echocardiogram showed EF 15%, continue Lasix 80 mg every 12 hours per heart failure team.Cardiology following.  Strict intake and output. Continue Aldactone. Net - 10.5 L. 2. Abdominal distention- KUB negative, no abdominal pain, nausea or vomiting. 3. Hypertension- blood pressure is stable, continue Cozaar, Coreg, Hydralazine.  4. CKD stage III- patient's baseline creatinine 1.3, today creatinine 1.33. Will closely monitor BMP as patient is on Lasix 5. Noncompliance - Pt counseled at bedside.  She is high risk for readmission secondary to this.   DVT prophylaxis: Heparin Code Status: Full code Family Communication: No family at bedside Disposition Plan: Home when medically stable  Consultants:  None   Procedures:  None   Antibiotics:  None   Subjective   Patient seen and examined, admitted with  anasarca/fluid overload. Good diuresis with IV Lasix. Breathing has improved, weight down.   Objective    Objective: Vitals:   06/30/16 0607 06/30/16 1131 06/30/16 2015 07/01/16 0453  BP: 131/83 128/78 118/76 118/84  Pulse: 81 81 86 81  Resp: 18 18 20 18   Temp: 97.9 F (36.6 C) 97.4 F (36.3 C) 98.6 F (37 C) 97.8 F (36.6 C)  TempSrc: Oral Oral Oral Oral  SpO2: 100% 100% 96% 99%  Weight: 98.6 kg (217 lb 4.8 oz)     Height:        Intake/Output Summary (Last 24 hours) at 07/01/16 1012 Last data filed at 07/01/16 0615  Gross per 24 hour  Intake             1375 ml  Output             4500 ml  Net            -3125 ml   Filed Weights   06/28/16 0355 06/29/16 0440 06/30/16 0607  Weight: 103.1 kg (227 lb 6.4 oz) 101.3 kg (223 lb 6.4 oz) 98.6 kg (217 lb 4.8 oz)    Examination:  General exam: Appears calm and comfortable  Respiratory system: Clear to auscultation bilaterally. Respiratory effort normal. Cardiovascular system: S1 & S2 heard, RRR. No  murmurs, rubs, gallops or clicks. Bilateral 2+edema in lower extremities Gastrointestinal system: Abdomen is nondistended, soft and nontender. No organomegaly or masses felt. Normal bowel sounds heard. Central nervous system: Alert and oriented. No focal neurological deficits.  Extremities: pretibial edema noted bilateral.  Skin: No rashes, lesions or ulcers Psychiatry: Judgement and insight appear normal. Mood & affect appropriate.   Data Reviewed: I have personally reviewed following labs and imaging studies Basic Metabolic Panel:  Recent Labs Lab 06/27/16 0506 06/28/16 0344 06/29/16 0443 06/30/16 0320 07/01/16 0453  NA 142 140 137 139 138  K 3.2* 3.8 3.8 3.7 3.6  CL 106 101 101 100* 96*  CO2 23 25 28 28 31   GLUCOSE 122* 157* 92 108* 89  BUN 18 21* 20 21* 22*  CREATININE 1.41* 1.34* 1.26* 1.30* 1.33*  CALCIUM 9.2 8.8* 9.3 9.1 9.6  MG  --   --   --  1.8  --    CBC:  Recent Labs Lab 06/26/16 1429 06/27/16 0506   WBC 8.4 7.8  NEUTROABS 5.5  --   HGB 14.8 13.5  HCT 48.7* 43.8  MCV 91.5 90.3  PLT 285 242   Cardiac Enzymes:  Recent Labs Lab 06/26/16 1429  TROPONINI <0.03   BNP (last 3 results)  Recent Labs  09/02/15 1000 02/20/16 1124 06/26/16 1441  BNP 263.3* 1,564.1* 1,443.5*      Studies: No results found.  Scheduled Meds: . atorvastatin  80 mg Oral q1800  . carvedilol  3.125 mg Oral BID WC  . furosemide  80 mg Intravenous BID  . heparin  5,000 Units Subcutaneous Q8H  . hydrALAZINE  25 mg Oral Q8H  . isosorbide mononitrate  30 mg Oral Daily  . losartan  50 mg Oral Daily  . potassium chloride  10 mEq Oral Daily  . sodium chloride flush  3 mL Intravenous Q12H  . spironolactone  25 mg Oral Daily  . ticagrelor  60 mg Oral BID   Continuous Infusions:   Time spent: 25 min  Natahsa Marian 08/26/16 [phone]. If 7PM-7AM, please contact night-coverage at [email or web address], Office  [phone]  password [email or web address] 07/01/2016, 10:12 AM  LOS: 3 days

## 2016-07-02 DIAGNOSIS — R609 Edema, unspecified: Secondary | ICD-10-CM

## 2016-07-02 LAB — BASIC METABOLIC PANEL
Anion gap: 13 (ref 5–15)
BUN: 22 mg/dL — ABNORMAL HIGH (ref 6–20)
CO2: 30 mmol/L (ref 22–32)
Calcium: 9.7 mg/dL (ref 8.9–10.3)
Chloride: 93 mmol/L — ABNORMAL LOW (ref 101–111)
Creatinine, Ser: 1.33 mg/dL — ABNORMAL HIGH (ref 0.44–1.00)
GFR calc Af Amer: 48 mL/min — ABNORMAL LOW (ref >60)
GFR calc non Af Amer: 41 mL/min — ABNORMAL LOW (ref >60)
Glucose, Bld: 94 mg/dL (ref 65–99)
Potassium: 3.7 mmol/L (ref 3.5–5.1)
Sodium: 136 mmol/L (ref 135–145)

## 2016-07-02 LAB — CBC
HCT: 46.7 % — ABNORMAL HIGH (ref 36.0–46.0)
Hemoglobin: 14.8 g/dL (ref 12.0–15.0)
MCH: 28.2 pg (ref 26.0–34.0)
MCHC: 31.7 g/dL (ref 30.0–36.0)
MCV: 89.1 fL (ref 78.0–100.0)
Platelets: 310 10*3/uL (ref 150–400)
RBC: 5.24 MIL/uL — ABNORMAL HIGH (ref 3.87–5.11)
RDW: 14.7 % (ref 11.5–15.5)
WBC: 7 10*3/uL (ref 4.0–10.5)

## 2016-07-02 MED ORDER — SACUBITRIL-VALSARTAN 24-26 MG PO TABS
1.0000 | ORAL_TABLET | Freq: Two times a day (BID) | ORAL | Status: DC
  Administered 2016-07-03 – 2016-07-06 (×7): 1 via ORAL
  Filled 2016-07-02 (×7): qty 1

## 2016-07-02 MED ORDER — ENOXAPARIN SODIUM 40 MG/0.4ML ~~LOC~~ SOLN
40.0000 mg | SUBCUTANEOUS | Status: DC
  Administered 2016-07-02 – 2016-07-05 (×4): 40 mg via SUBCUTANEOUS
  Filled 2016-07-02 (×4): qty 0.4

## 2016-07-02 MED ORDER — POTASSIUM CHLORIDE CRYS ER 20 MEQ PO TBCR
40.0000 meq | EXTENDED_RELEASE_TABLET | Freq: Once | ORAL | Status: AC
  Administered 2016-07-02: 40 meq via ORAL
  Filled 2016-07-02: qty 2

## 2016-07-02 MED ORDER — METOLAZONE 5 MG PO TABS
5.0000 mg | ORAL_TABLET | Freq: Once | ORAL | Status: AC
  Administered 2016-07-02: 5 mg via ORAL
  Filled 2016-07-02: qty 1

## 2016-07-02 NOTE — Progress Notes (Signed)
Pt slept on and off during night, vitals stable, no any other sign of pain and distress, will continue to monitor

## 2016-07-02 NOTE — Progress Notes (Signed)
[facility]  PROGRESS NOTE  Deborah Ho QQP:[id number] DOB: 1950/11/26 DOA: 06/26/2016 PCP: Grayce Sessions, NP   Brief HPI:   65 y.o. female  with hx CAD, CHF (EF 15%), HTN, HLD p/w significant weight gain and exasperation about her condition.  Weight has increased significantly over the past two weeks - she has gained weight in her abdomen and legs.  She is unable to walk well because she feels so heavy.  She feels she has been compliant with her regimen and diet, though she did cut back her furosemide to every third day instead of every other day, and eats take out food including occasional fried chicken or fish, pizza, and has a hot dog every Saturday.  She coughs at night time, productive of clear sputum - this is unchanged.  Denies known fevers, CP, SOB, abdominal pain.  Has soreness in her legs from increased fluid.  She is feeling depressed because she keeps gaining fluid and she doesn't understand why.  She feels that she is doing everything she is supposed to be doing and is frustrated.  Denies SI.     Assessment/Plan:    1. Acute on chronic systolic CHF- echocardiogram showed EF 15%, continue Lasix 80 mg every 12 hours per heart failure team.  Cardiology following.  Strict intake and output. Continue Aldactone. Net - 13 L. 2. Abdominal distention- KUB negative, no abdominal pain, nausea or vomiting. 3. Hypertension- blood pressure is stable, continue Cozaar, Coreg, Hydralazine.  4. CKD stage III- patient's baseline creatinine 1.3, today creatinine 1.33. Will closely monitor BMP as patient is on Lasix 5. Noncompliance - Pt counseled at bedside.  She is high risk for readmission secondary to this.   DVT prophylaxis: Heparin Code Status: Full code Family Communication: No family at bedside Disposition Plan: Home when medically stable  Consultants:  Heart Failure Team    Subjective   Patient seen and examined, admitted with anasarca/fluid overload. Good diuresis with  IV Lasix. Weight down.   Objective    Objective: Vitals:   07/01/16 1154 07/01/16 1945 07/02/16 0520 07/02/16 1001  BP:  (!) 106/59 111/62 125/84  Pulse:  83 83 88  Resp:  18 18   Temp:  98.2 F (36.8 C) 98.3 F (36.8 C)   TempSrc:   Oral   SpO2:  97% 98%   Weight: 93.8 kg (206 lb 12.8 oz)  91.1 kg (200 lb 14.4 oz)   Height:        Intake/Output Summary (Last 24 hours) at 07/02/16 1318 Last data filed at 07/02/16 1130  Gross per 24 hour  Intake              700 ml  Output             3500 ml  Net            -2800 ml   Filed Weights   06/30/16 0607 07/01/16 1154 07/02/16 0520  Weight: 98.6 kg (217 lb 4.8 oz) 93.8 kg (206 lb 12.8 oz) 91.1 kg (200 lb 14.4 oz)    Examination:  General exam: Appears calm and comfortable  Respiratory system: Clear to auscultation bilaterally. Respiratory effort normal. Cardiovascular system: S1 & S2 heard, RRR. No  murmurs, rubs, gallops or clicks. Bilateral 2+edema in lower extremities Gastrointestinal system: Abdomen is nondistended, soft and nontender. No organomegaly or masses felt. Normal bowel sounds heard. Central nervous system: Alert and oriented. No focal neurological deficits. Extremities: pretibial edema noted bilateral.  Skin:  No rashes, lesions or ulcers Psychiatry: Judgement and insight appear normal. Mood & affect appropriate.   Data Reviewed: I have personally reviewed following labs and imaging studies Basic Metabolic Panel:  Recent Labs Lab 06/28/16 0344 06/29/16 0443 06/30/16 0320 07/01/16 0453 07/02/16 0258  NA 140 137 139 138 136  K 3.8 3.8 3.7 3.6 3.7  CL 101 101 100* 96* 93*  CO2 25 28 28 31 30   GLUCOSE 157* 92 108* 89 94  BUN 21* 20 21* 22* 22*  CREATININE 1.34* 1.26* 1.30* 1.33* 1.33*  CALCIUM 8.8* 9.3 9.1 9.6 9.7  MG  --   --  1.8  --   --    CBC:  Recent Labs Lab 06/26/16 1429 06/27/16 0506 07/02/16 0258  WBC 8.4 7.8 7.0  NEUTROABS 5.5  --   --   HGB 14.8 13.5 14.8  HCT 48.7* 43.8 46.7*   MCV 91.5 90.3 89.1  PLT 285 242 310   Cardiac Enzymes:  Recent Labs Lab 06/26/16 1429  TROPONINI <0.03   BNP (last 3 results)  Recent Labs  09/02/15 1000 02/20/16 1124 06/26/16 1441  BNP 263.3* 1,564.1* 1,443.5*   Studies: No results found.  Scheduled Meds: . atorvastatin  80 mg Oral q1800  . carvedilol  3.125 mg Oral BID WC  . enoxaparin (LOVENOX) injection  40 mg Subcutaneous Q24H  . furosemide  80 mg Intravenous BID  . hydrALAZINE  25 mg Oral Q8H  . isosorbide mononitrate  30 mg Oral Daily  . potassium chloride  10 mEq Oral Daily  . [START ON 07/03/2016] sacubitril-valsartan  1 tablet Oral BID  . sodium chloride flush  3 mL Intravenous Q12H  . spironolactone  25 mg Oral Daily  . ticagrelor  60 mg Oral BID   Continuous Infusions:   Time spent: 21 min  Dillinger Aston 07/05/2016 [phone]. If 7PM-7AM, please contact night-coverage at [email or web address], Office  [phone]  password [email or web address] 07/02/2016, 1:18 PM  LOS: 4 days

## 2016-07-02 NOTE — Progress Notes (Signed)
Patient ID: Deborah Ho, female   DOB: 1951/08/19, 65 y.o.   MRN: [id number]    SUBJECTIVE:   Feels good. Weight still coming off. Walking around without DOE or CP.  No lightheadedness or dizziness.   Out 2.4 L. Weight down 6 lbs. Creatinine stable. K 3.7   Scheduled Meds: . atorvastatin  80 mg Oral q1800  . carvedilol  3.125 mg Oral BID WC  . furosemide  80 mg Intravenous BID  . heparin  5,000 Units Subcutaneous Q8H  . hydrALAZINE  25 mg Oral Q8H  . isosorbide mononitrate  30 mg Oral Daily  . losartan  50 mg Oral Daily  . potassium chloride  10 mEq Oral Daily  . sodium chloride flush  3 mL Intravenous Q12H  . spironolactone  25 mg Oral Daily  . ticagrelor  60 mg Oral BID   Continuous Infusions:  PRN Meds:.sodium chloride, acetaminophen **OR** acetaminophen, famotidine, nitroGLYCERIN, ondansetron **OR** ondansetron (ZOFRAN) IV, polyethylene glycol, sodium chloride flush, traZODone    Vitals:   07/01/16 1138 07/01/16 1154 07/01/16 1945 07/02/16 0520  BP: 101/60  (!) 106/59 111/62  Pulse: 84  83 83  Resp: 18  18 18   Temp: 98.1 F (36.7 C)  98.2 F (36.8 C) 98.3 F (36.8 C)  TempSrc: Oral   Oral  SpO2: 98%  97% 98%  Weight:  206 lb 12.8 oz (93.8 kg)  200 lb 14.4 oz (91.1 kg)  Height:        Intake/Output Summary (Last 24 hours) at 07/02/16 0956 Last data filed at 07/02/16 0914  Gross per 24 hour  Intake             1050 ml  Output             3250 ml  Net            -2200 ml    LABS: Basic Metabolic Panel:  Recent Labs  07/04/16 0320 07/01/16 0453 07/02/16 0258  NA 139 138 136  K 3.7 3.6 3.7  CL 100* 96* 93*  CO2 28 31 30   GLUCOSE 108* 89 94  BUN 21* 22* 22*  CREATININE 1.30* 1.33* 1.33*  CALCIUM 9.1 9.6 9.7  MG 1.8  --   --    Liver Function Tests: No results for input(s): AST, ALT, ALKPHOS, BILITOT, PROT, ALBUMIN in the last 72 hours. No results for input(s): LIPASE, AMYLASE in the last 72 hours. CBC:  Recent Labs  07/02/16 0258  WBC 7.0    HGB 14.8  HCT 46.7*  MCV 89.1  PLT 310   Cardiac Enzymes: No results for input(s): CKTOTAL, CKMB, CKMBINDEX, TROPONINI in the last 72 hours. BNP: Invalid input(s): POCBNP D-Dimer: No results for input(s): DDIMER in the last 72 hours. Hemoglobin A1C: No results for input(s): HGBA1C in the last 72 hours. Fasting Lipid Panel: No results for input(s): CHOL, HDL, LDLCALC, TRIG, CHOLHDL, LDLDIRECT in the last 72 hours. Thyroid Function Tests: No results for input(s): TSH, T4TOTAL, T3FREE, THYROIDAB in the last 72 hours.  Invalid input(s): FREET3 Anemia Panel: No results for input(s): VITAMINB12, FOLATE, FERRITIN, TIBC, IRON, RETICCTPCT in the last 72 hours.  RADIOLOGY: Dg Chest 2 View  Result Date: 06/26/2016 CLINICAL DATA:  Pt reports from PCP for abdomen and leg swelling. Pt states she has gained 15 pounds since April 2017. Pt c/o SOB and intermittent productive cough x a few months. Pt has been on two rounds of antibiotics for cough with no relief. Hx CHF  and cardiac surgery. EXAM: CHEST  2 VIEW COMPARISON:  02/20/2016 FINDINGS: Cardiac silhouette is mildly enlarged. No mediastinal or hilar masses or evidence of adenopathy. Aorta is mildly uncoiled. Clear lungs.  No pleural effusion or pneumothorax. Skeletal structures are intact. IMPRESSION: No acute cardiopulmonary disease.  Mild cardiomegaly. Electronically Signed   By: Amie Portland M.D.   On: 06/26/2016 15:25   Dg Abd 2 Views  Result Date: 06/26/2016 CLINICAL DATA:  Pt reports ongoing nausea, vomiting and weight gain. Pt states she has gained 15 pounds since April 2017. Pt denies constipation and diarrhea. EXAM: ABDOMEN - 2 VIEW COMPARISON:  Chest x-ray 06/26/2016 FINDINGS: There is paucity of bowel gas. The visualized bowel gas pattern is nonobstructive. No evidence for organomegaly. No abnormal calcifications. Visualized osseous structures have a normal appearance. IMPRESSION: No evidence for acute  abnormality. Electronically  Signed   By: Norva Pavlov M.D.   On: 06/26/2016 18:55    PHYSICAL EXAM General: NAD Neck: JVP 12+ cm, no thyromegaly or thyroid nodule.  Lungs: CTAB, normal effort. CV: Lateral PMI.  Heart regular S1/S2, no S3/S4, 2/6 HSM LLSB.  1+ edema to thighs bilaterally.   Abdomen: Soft,NT, ND, no HSM. No bruits or masses. +BS  Neurologic: Alert and oriented x 3.  Psych: Normal affect. Extremities: No clubbing or cyanosis.   TELEMETRY: Reviewed, NSR 980s  ASSESSMENT AND PLAN: Deborah Ho is a 65 y.o. female with a history of HTN, HLD, tobacco abuse, CAD s/panterior STEMI with early stent thrombosis for missing doses of Brilinta s/p PCI x 2 into LAD (04/2014) and chronic systolic HF with EF 15% by 4/17 echo. She was admitted with marked volume overload in the setting of suspected medication noncompliance.Says her PCP told her to stop torsemide and started her on Lasix.  1. Acute on chronic systolic CHF: Ischemic cardiomyopathy.  Echo (4/17) with EF 15%, severe TR, moderate RV systolic dysfunction.     She was taking Lasix every other day and her PCP "was trying to get me off the water pill."  She has very poor insight into her condition which severely impairs her care.  - Continues to diurese, creatinine stable. Volume remains elevated - Continue Lasix 80 mg IV BID. Will repeat 5 mg metolazone today with 40 meq K.  May be able to transition to po in am.  - Encouraged to wear ted hose when upright.  - Continue low dose Coreg  - Starting Entresto 24/26 mg BID.  - Continue spironolactone 25 mg daily.  - Continue hydralazine 25 mg TID and Imdur 30 mg daily.  BP stable in 110s.   - Long talk this admission about letting the cardiologists control her heart-related meds.  2. CAD:  S/panterior STEMI with early stent thrombosis for missing doses of Brilinta, s/p PCI x 2 into LAD (04/2014). - She has had apparent angioedema with ASA.  - Continue atorvastatin 80 mg daily and ticagrelor 60 mg BID. May be  difficult to get her to continue as outpatient (had previously stopped on her own volition)  3. Noncompliance:  - Remains a significant issue.  She refuses paramedicine.  - Think there is likely an underlying psychiatric diagnosis.   - Again discussed importance of cardiac meds and compliance.  - Will aim to keep her regimen as straightforward as possible.  Continue IV diuresis today. May be able to transition to po meds tomorrow.   Graciella Freer, PA-C 07/02/2016 9:56 AM   Advanced Heart Failure Team Pager [phone] (  M-F; 7a - 4p)  Please contact [facility] for night-coverage after hours (4p -7a ) and weekends on [email or web address]  Patient seen with PA, agree with the above note.  She continues to diurese well.  Continue current regimen of Lasix IV and metolazone as she remains volume overloaded.  Will transition to [facility].  May have another couple of days of diuresis.  Creatinine stable.   [name] 07/02/2016 12:08 PM

## 2016-07-03 LAB — BASIC METABOLIC PANEL
Anion gap: 13 (ref 5–15)
BUN: 29 mg/dL — ABNORMAL HIGH (ref 6–20)
CO2: 32 mmol/L (ref 22–32)
Calcium: 10.1 mg/dL (ref 8.9–10.3)
Chloride: 91 mmol/L — ABNORMAL LOW (ref 101–111)
Creatinine, Ser: 1.52 mg/dL — ABNORMAL HIGH (ref 0.44–1.00)
GFR calc Af Amer: 41 mL/min — ABNORMAL LOW (ref >60)
GFR calc non Af Amer: 35 mL/min — ABNORMAL LOW (ref >60)
Glucose, Bld: 111 mg/dL — ABNORMAL HIGH (ref 65–99)
Potassium: 3.8 mmol/L (ref 3.5–5.1)
Sodium: 136 mmol/L (ref 135–145)

## 2016-07-03 MED ORDER — METOLAZONE 5 MG PO TABS
5.0000 mg | ORAL_TABLET | Freq: Once | ORAL | Status: AC
  Administered 2016-07-03: 5 mg via ORAL
  Filled 2016-07-03: qty 1

## 2016-07-03 NOTE — Progress Notes (Signed)
CARDIAC REHAB PHASE I   PRE:  Rate/Rhythm: 85 SR  BP:  Sitting: 121/74        SaO2: 97 RA  MODE:  Ambulation: 700 ft   POST:  Rate/Rhythm: 92 SR  BP:  Sitting: 125/69         SaO2: 100 RA  Pt ambulated 700 ft on RA, hand held assist, slow, steady gait, tolerated well. Pt denies DOE, c/o a "tightness" and  "pulling" in her legs/thighs, which she states makes it difficult for her to continue walking. Pt states this is "where the fluid goes." Standing rest x1. Pt states she thinks her BP is "too low." Reassured pt her BP is normal. Pt to edge of bed per pt request after walk, call bell within reach. Will follow.   [id number] Joylene Grapes, RN, BSN 07/03/2016 2:43 PM

## 2016-07-03 NOTE — Progress Notes (Signed)
Patient ID: Deborah Ho, female   DOB: 1951-05-30, 65 y.o.   MRN: [id number]    SUBJECTIVE:  Yesterday she continued to diurese with IV lasix and started on entresto. Creatinine trending up from 1.3>1.5. Weight down 5 pounds.   Overall feeling better.   Scheduled Meds: . atorvastatin  80 mg Oral q1800  . carvedilol  3.125 mg Oral BID WC  . enoxaparin (LOVENOX) injection  40 mg Subcutaneous Q24H  . furosemide  80 mg Intravenous BID  . hydrALAZINE  25 mg Oral Q8H  . isosorbide mononitrate  30 mg Oral Daily  . potassium chloride  10 mEq Oral Daily  . sacubitril-valsartan  1 tablet Oral BID  . sodium chloride flush  3 mL Intravenous Q12H  . spironolactone  25 mg Oral Daily  . ticagrelor  60 mg Oral BID   Continuous Infusions:  PRN Meds:.sodium chloride, acetaminophen **OR** acetaminophen, famotidine, nitroGLYCERIN, ondansetron **OR** ondansetron (ZOFRAN) IV, polyethylene glycol, sodium chloride flush, traZODone    Vitals:   07/02/16 1515 07/02/16 1721 07/02/16 2051 07/03/16 0455  BP: (!) 108/57 (!) 116/58 120/76 133/75  Pulse:  85 86 90  Resp:   (!) 22 18  Temp:   98.4 F (36.9 C) 98.4 F (36.9 C)  TempSrc:   Oral Oral  SpO2:   95% 96%  Weight:    195 lb 3.2 oz (88.5 kg)  Height:        Intake/Output Summary (Last 24 hours) at 07/03/16 0831 Last data filed at 07/03/16 0647  Gross per 24 hour  Intake             1920 ml  Output             3700 ml  Net            -1780 ml    LABS: Basic Metabolic Panel:  Recent Labs  09/62/83 0258 07/03/16 0319  NA 136 136  K 3.7 3.8  CL 93* 91*  CO2 30 32  GLUCOSE 94 111*  BUN 22* 29*  CREATININE 1.33* 1.52*  CALCIUM 9.7 10.1   Liver Function Tests: No results for input(s): AST, ALT, ALKPHOS, BILITOT, PROT, ALBUMIN in the last 72 hours. No results for input(s): LIPASE, AMYLASE in the last 72 hours. CBC:  Recent Labs  07/02/16 0258  WBC 7.0  HGB 14.8  HCT 46.7*  MCV 89.1  PLT 310   Cardiac Enzymes: No  results for input(s): CKTOTAL, CKMB, CKMBINDEX, TROPONINI in the last 72 hours. BNP: Invalid input(s): POCBNP D-Dimer: No results for input(s): DDIMER in the last 72 hours. Hemoglobin A1C: No results for input(s): HGBA1C in the last 72 hours. Fasting Lipid Panel: No results for input(s): CHOL, HDL, LDLCALC, TRIG, CHOLHDL, LDLDIRECT in the last 72 hours. Thyroid Function Tests: No results for input(s): TSH, T4TOTAL, T3FREE, THYROIDAB in the last 72 hours.  Invalid input(s): FREET3 Anemia Panel: No results for input(s): VITAMINB12, FOLATE, FERRITIN, TIBC, IRON, RETICCTPCT in the last 72 hours.  RADIOLOGY: Dg Chest 2 View  Result Date: 06/26/2016 CLINICAL DATA:  Pt reports from PCP for abdomen and leg swelling. Pt states she has gained 15 pounds since April 2017. Pt c/o SOB and intermittent productive cough x a few months. Pt has been on two rounds of antibiotics for cough with no relief. Hx CHF and cardiac surgery. EXAM: CHEST  2 VIEW COMPARISON:  02/20/2016 FINDINGS: Cardiac silhouette is mildly enlarged. No mediastinal or hilar masses or evidence of adenopathy. Aorta is mildly  uncoiled. Clear lungs.  No pleural effusion or pneumothorax. Skeletal structures are intact. IMPRESSION: No acute cardiopulmonary disease.  Mild cardiomegaly. Electronically Signed   By: Amie Portland M.D.   On: 06/26/2016 15:25   Dg Abd 2 Views  Result Date: 06/26/2016 CLINICAL DATA:  Pt reports ongoing nausea, vomiting and weight gain. Pt states she has gained 15 pounds since April 2017. Pt denies constipation and diarrhea. EXAM: ABDOMEN - 2 VIEW COMPARISON:  Chest x-ray 06/26/2016 FINDINGS: There is paucity of bowel gas. The visualized bowel gas pattern is nonobstructive. No evidence for organomegaly. No abnormal calcifications. Visualized osseous structures have a normal appearance. IMPRESSION: No evidence for acute  abnormality. Electronically Signed   By: Norva Pavlov M.D.   On: 06/26/2016 18:55    PHYSICAL  EXAM General: NAD. Sitting on the side  Neck: JVP  ~10 cm, no thyromegaly or thyroid nodule.  Lungs: CTAB, normal effort. CV: Lateral PMI.  Heart regular S1/S2, no S3/S4, 2/6 HSM LLSB.   R and LLE 1+ edema to thighs bilaterally.   Abdomen: Soft,NT, ND, no HSM. No bruits or masses. +BS  Neurologic: Alert and oriented x 3.  Psych: Normal affect. Extremities: No clubbing or cyanosis.   TELEMETRY: Reviewed, NSR 80s  ASSESSMENT AND PLAN: [name] is a 65 y.o. female with a history of HTN, HLD, tobacco abuse, CAD s/panterior STEMI with early stent thrombosis for missing doses of Brilinta s/p PCI x 2 into LAD (04/2014) and chronic systolic HF with EF 15% by 4/17 echo. She was admitted with marked volume overload in the setting of suspected medication noncompliance.Says her PCP told her to stop torsemide and started her on Lasix.  1. Acute on chronic systolic CHF: Ischemic cardiomyopathy.  Echo (4/17) with EF 15%, severe TR, moderate RV systolic dysfunction.     She was taking Lasix every other day and her PCP "was trying to get me off the water pill."  She has very poor insight into her condition which severely impairs her care.  - Volume status remains elevated. Continue Lasix 80 mg IV BID +  5 mg metolazone today with 40 meq K. Anticipate transitioning torsemide 60 mg twice a day. Follow BMET closely.   Continue low dose Coreg  - Continue  Entresto 24/26 mg BID.  - Continue spironolactone 25 mg daily.  - Continue hydralazine 25 mg TID and Imdur 30 mg daily.  BP stable in 110s.   2. CAD:  S/panterior STEMI with early stent thrombosis for missing doses of Brilinta, s/p PCI x 2 into LAD (04/2014). - She has had apparent angioedema with ASA.  - Continue atorvastatin 80 mg daily and ticagrelor 60 mg BID. May be difficult to get her to continue as outpatient (had previously stopped on her own volition)  3. Noncompliance:  - Remains a significant issue.  She refuses paramedicine.  - Think there  is likely an underlying psychiatric diagnosis.   - Again discussed importance of cardiac meds and compliance.  - Will aim to keep her regimen as straightforward as possible.  Consult cardiac rehab.   Tonye Becket, NP-C  07/03/2016 8:31 AM   Advanced Heart Failure Team Pager [phone] (M-F; 7a - 4p)  Please contact [facility] for night-coverage after hours (4p -7a ) and weekends on [email or web address]  Patient seen with NP, agree with the above note.  Weight down again.  Creatinine up a bit to 1.5.  Still some volume overload on exam.  Will aim for 1 more day  of diuresis, probably transition to po tomorrow. Will give her a metolazone dose today.  Will continue her cardiac meds without change today.   [name] 07/03/2016 11:58 AM

## 2016-07-03 NOTE — Progress Notes (Signed)
[facility]  PROGRESS NOTE  Deborah Ho PBD:[id number] DOB: May 13, 1951 DOA: 06/26/2016 PCP: Grayce Sessions, NP   Brief HPI:   65 y.o. female  with hx CAD, CHF (EF 15%), HTN, HLD p/w significant weight gain and exasperation about her condition.  Weight has increased significantly over the past two weeks - she has gained weight in her abdomen and legs.  She is unable to walk well because she feels so heavy.  She feels she has been compliant with her regimen and diet, though she did cut back her furosemide to every third day instead of every other day, and eats take out food including occasional fried chicken or fish, pizza, and has a hot dog every Saturday.  She coughs at night time, productive of clear sputum - this is unchanged.  Denies known fevers, CP, SOB, abdominal pain.  Has soreness in her legs from increased fluid.  She is feeling depressed because she keeps gaining fluid and she doesn't understand why.  She feels that she is doing everything she is supposed to be doing and is frustrated.  Denies SI.     Assessment/Plan:    1. Acute on chronic systolic CHF- echocardiogram showed EF 15%, continue diuresis per heart failure team.  HF Cardiology following.  Strict intake and output. Continue Aldactone. Net - 13 L. 2. Abdominal distention- Resolved now.  KUB negative, no abdominal pain, nausea or vomiting. 3. Hypertension- blood pressure is stable, controlled, continue Cozaar, Coreg, Hydralazine.  4. CKD stage III- patient's baseline creatinine 1.3, today creatinine 1.5. Will closely monitor BMP as patient is diuresed. 5. Noncompliance - Pt counseled at bedside.  She is high risk for readmission secondary to this.   DVT prophylaxis: Heparin Code Status: Full code Family Communication: No family at bedside Disposition Plan: Home when medically stable  Consultants:  Heart Failure Team    Subjective   Patient seen and examined, admitted with anasarca/fluid overload. Good  diuresis with IV Lasix. Weight down. Pt says she feels better.   Objective    Objective: Vitals:   07/02/16 1515 07/02/16 1721 07/02/16 2051 07/03/16 0455  BP: (!) 108/57 (!) 116/58 120/76 133/75  Pulse:  85 86 90  Resp:   (!) 22 18  Temp:   98.4 F (36.9 C) 98.4 F (36.9 C)  TempSrc:   Oral Oral  SpO2:   95% 96%  Weight:    88.5 kg (195 lb 3.2 oz)  Height:        Intake/Output Summary (Last 24 hours) at 07/03/16 1002 Last data filed at 07/03/16 0909  Gross per 24 hour  Intake             2040 ml  Output             3300 ml  Net            -1260 ml   Filed Weights   07/01/16 1154 07/02/16 0520 07/03/16 0455  Weight: 93.8 kg (206 lb 12.8 oz) 91.1 kg (200 lb 14.4 oz) 88.5 kg (195 lb 3.2 oz)    Examination:  General exam: Appears calm and comfortable  Respiratory system: Clear to auscultation bilaterally. Respiratory effort normal. Cardiovascular system: S1 & S2 heard, RRR. No  murmurs, rubs, gallops or clicks. Bilateral 2+edema in lower extremities Gastrointestinal system: Abdomen is nondistended, soft and nontender. No organomegaly or masses felt. Normal bowel sounds heard. Central nervous system: Alert and oriented. No focal neurological deficits. Extremities: pretibial edema noted bilateral.  Skin:  No rashes, lesions or ulcers Psychiatry: Judgement and insight appear normal. Mood & affect appropriate.   Data Reviewed: I have personally reviewed following labs and imaging studies Basic Metabolic Panel:  Recent Labs Lab 06/29/16 0443 06/30/16 0320 07/01/16 0453 07/02/16 0258 07/03/16 0319  NA 137 139 138 136 136  K 3.8 3.7 3.6 3.7 3.8  CL 101 100* 96* 93* 91*  CO2 28 28 31 30  32  GLUCOSE 92 108* 89 94 111*  BUN 20 21* 22* 22* 29*  CREATININE 1.26* 1.30* 1.33* 1.33* 1.52*  CALCIUM 9.3 9.1 9.6 9.7 10.1  MG  --  1.8  --   --   --    CBC:  Recent Labs Lab 06/26/16 1429 06/27/16 0506 07/02/16 0258  WBC 8.4 7.8 7.0  NEUTROABS 5.5  --   --   HGB 14.8  13.5 14.8  HCT 48.7* 43.8 46.7*  MCV 91.5 90.3 89.1  PLT 285 242 310   Cardiac Enzymes:  Recent Labs Lab 06/26/16 1429  TROPONINI <0.03   BNP (last 3 results)  Recent Labs  09/02/15 1000 02/20/16 1124 06/26/16 1441  BNP 263.3* 1,564.1* 1,443.5*   Studies: No results found.  Scheduled Meds: . atorvastatin  80 mg Oral q1800  . carvedilol  3.125 mg Oral BID WC  . enoxaparin (LOVENOX) injection  40 mg Subcutaneous Q24H  . furosemide  80 mg Intravenous BID  . hydrALAZINE  25 mg Oral Q8H  . isosorbide mononitrate  30 mg Oral Daily  . potassium chloride  10 mEq Oral Daily  . sacubitril-valsartan  1 tablet Oral BID  . sodium chloride flush  3 mL Intravenous Q12H  . spironolactone  25 mg Oral Daily  . ticagrelor  60 mg Oral BID   Continuous Infusions:   Time spent: 17 min  Destanie Tibbetts 08/26/16 [phone]. If 7PM-7AM, please contact night-coverage at [email or web address], Office  [phone]  password [email or web address] 07/03/2016, 10:02 AM  LOS: 5 days

## 2016-07-04 LAB — BASIC METABOLIC PANEL
Anion gap: 14 (ref 5–15)
BUN: 33 mg/dL — ABNORMAL HIGH (ref 6–20)
CO2: 30 mmol/L (ref 22–32)
Calcium: 9.8 mg/dL (ref 8.9–10.3)
Chloride: 89 mmol/L — ABNORMAL LOW (ref 101–111)
Creatinine, Ser: 1.63 mg/dL — ABNORMAL HIGH (ref 0.44–1.00)
GFR calc Af Amer: 37 mL/min — ABNORMAL LOW (ref >60)
GFR calc non Af Amer: 32 mL/min — ABNORMAL LOW (ref >60)
Glucose, Bld: 101 mg/dL — ABNORMAL HIGH (ref 65–99)
Potassium: 4.3 mmol/L (ref 3.5–5.1)
Sodium: 133 mmol/L — ABNORMAL LOW (ref 135–145)

## 2016-07-04 LAB — MAGNESIUM: Magnesium: 2.1 mg/dL (ref 1.7–2.4)

## 2016-07-04 MED ORDER — HYDRALAZINE HCL 25 MG PO TABS
12.5000 mg | ORAL_TABLET | Freq: Three times a day (TID) | ORAL | Status: DC
  Administered 2016-07-04 – 2016-07-06 (×7): 12.5 mg via ORAL
  Filled 2016-07-04 (×7): qty 1

## 2016-07-04 NOTE — Progress Notes (Signed)
[facility]  PROGRESS NOTE  Deborah Ho TMA:[id number] DOB: 1951-05-26 DOA: 06/26/2016 PCP: Grayce Sessions, NP   Brief HPI:   65 y.o. female  with hx CAD, CHF (EF 15%), HTN, HLD p/w significant weight gain and exasperation about her condition.  Weight has increased significantly over the past two weeks - she has gained weight in her abdomen and legs.  She is unable to walk well because she feels so heavy.  She feels she has been compliant with her regimen and diet, though she did cut back her furosemide to every third day instead of every other day, and eats take out food including occasional fried chicken or fish, pizza, and has a hot dog every Saturday.  She coughs at night time, productive of clear sputum - this is unchanged.  Denies known fevers, CP, SOB, abdominal pain.  Has soreness in her legs from increased fluid.  She is feeling depressed because she keeps gaining fluid and she doesn't understand why.  She feels that she is doing everything she is supposed to be doing and is frustrated.  Denies SI.     Assessment/Plan:    1. Acute on chronic systolic CHF- echocardiogram showed EF 15%, continue diuresis per heart failure team.  HF Cardiology following.  Strict intake and output. Diuretic management per cardiology team. Net - 16 L.  Weight down to 189 lb now.  2. Hyponatremia - likely secondary to diuresis, may need to cut back some on diuretic, will defer to cardiology team; BMP in AM. 3. Abdominal distention- Resolved now.  KUB negative, no abdominal pain, nausea or vomiting. 4. Hypertension- blood pressure is stable, controlled, continue Cozaar, Coreg, Hydralazine.  5. CKD stage III- patient's baseline creatinine 1.3, now trending up slightly with diuresis.  Will closely monitor BMP as patient is diuresed. 6. Noncompliance - Pt counseled at bedside.  She is high risk for readmission secondary to this.  She seems motivated to try harder when she is discharged home.   DVT  prophylaxis: Heparin Code Status: Full code Family Communication: No family at bedside Disposition Plan: Home when medically stable  Consultants:  Heart Failure Team    Subjective   Patient seen and examined, admitted with anasarca/fluid overload. Good diuresis with IV Lasix. Weight down. Pt ambulating with cardiac rehab team.   Objective    Objective: Vitals:   07/03/16 1804 07/03/16 2051 07/04/16 0600 07/04/16 0631  BP: 114/65 119/76 90/72 90/72   Pulse: 68 94    Resp:  17  16  Temp:  98.1 F (36.7 C)  97.4 F (36.3 C)  TempSrc:  Oral  Oral  SpO2:  100%  98%  Weight:    85.9 kg (189 lb 6.4 oz)  Height:        Intake/Output Summary (Last 24 hours) at 07/04/16 0738 Last data filed at 07/04/16 0634  Gross per 24 hour  Intake             1280 ml  Output             3550 ml  Net            -2270 ml   Filed Weights   07/02/16 0520 07/03/16 0455 07/04/16 0631  Weight: 91.1 kg (200 lb 14.4 oz) 88.5 kg (195 lb 3.2 oz) 85.9 kg (189 lb 6.4 oz)    Examination:  General exam: Appears calm and comfortable  Respiratory system: Clear to auscultation bilaterally. Respiratory effort normal. Cardiovascular system: S1 & S2  heard, RRR. No  murmurs, rubs, gallops or clicks. Bilateral 2+edema in lower extremities Gastrointestinal system: Abdomen is nondistended, soft and nontender. No organomegaly or masses felt. Normal bowel sounds heard. Central nervous system: Alert and oriented. No focal neurological deficits. Extremities: Bilateral LE edema slightly improved from previous exam.  Skin: No rashes, lesions or ulcers Psychiatry: Judgement and insight appear normal. Mood & affect appropriate.   Data Reviewed: I have personally reviewed following labs and imaging studies Basic Metabolic Panel:  Recent Labs Lab 06/30/16 0320 07/01/16 0453 07/02/16 0258 07/03/16 0319 07/04/16 0250  NA 139 138 136 136 133*  K 3.7 3.6 3.7 3.8 4.3  CL 100* 96* 93* 91* 89*  CO2 28 31 30  32 30   GLUCOSE 108* 89 94 111* 101*  BUN 21* 22* 22* 29* 33*  CREATININE 1.30* 1.33* 1.33* 1.52* 1.63*  CALCIUM 9.1 9.6 9.7 10.1 9.8  MG 1.8  --   --   --  2.1   CBC:  Recent Labs Lab 07/02/16 0258  WBC 7.0  HGB 14.8  HCT 46.7*  MCV 89.1  PLT 310   Cardiac Enzymes: No results for input(s): CKTOTAL, CKMB, CKMBINDEX, TROPONINI in the last 168 hours. BNP (last 3 results)  Recent Labs  09/02/15 1000 02/20/16 1124 06/26/16 1441  BNP 263.3* 1,564.1* 1,443.5*   Studies: No results found.  Scheduled Meds: . atorvastatin  80 mg Oral q1800  . carvedilol  3.125 mg Oral BID WC  . enoxaparin (LOVENOX) injection  40 mg Subcutaneous Q24H  . furosemide  80 mg Intravenous BID  . hydrALAZINE  25 mg Oral Q8H  . isosorbide mononitrate  30 mg Oral Daily  . potassium chloride  10 mEq Oral Daily  . sacubitril-valsartan  1 tablet Oral BID  . sodium chloride flush  3 mL Intravenous Q12H  . spironolactone  25 mg Oral Daily  . ticagrelor  60 mg Oral BID   Continuous Infusions:   Time spent: 16 min  Harrison Paulson 08/26/16 [phone]. If 7PM-7AM, please contact night-coverage at [email or web address], Office  [phone]  password [email or web address] 07/04/2016, 7:38 AM  LOS: 6 days

## 2016-07-04 NOTE — Progress Notes (Signed)
Patient ID: Deborah Ho, female   DOB: 13-May-1951, 65 y.o.   MRN: [id number]    SUBJECTIVE:  She diuresed again yesterday, weight down a total of 42 lbs since admission.  Creatinine up a bit to 1.63 today and SBP in 90s. Feels a little nauseated.   Scheduled Meds: . atorvastatin  80 mg Oral q1800  . carvedilol  3.125 mg Oral BID WC  . enoxaparin (LOVENOX) injection  40 mg Subcutaneous Q24H  . hydrALAZINE  12.5 mg Oral Q8H  . isosorbide mononitrate  30 mg Oral Daily  . sacubitril-valsartan  1 tablet Oral BID  . sodium chloride flush  3 mL Intravenous Q12H  . spironolactone  25 mg Oral Daily  . ticagrelor  60 mg Oral BID   Continuous Infusions:  PRN Meds:.sodium chloride, acetaminophen **OR** acetaminophen, famotidine, nitroGLYCERIN, ondansetron **OR** ondansetron (ZOFRAN) IV, polyethylene glycol, sodium chloride flush, traZODone    Vitals:   07/03/16 1804 07/03/16 2051 07/04/16 0600 07/04/16 0631  BP: 114/65 119/76 90/72 90/72   Pulse: 68 94    Resp:  17  16  Temp:  98.1 F (36.7 C)  97.4 F (36.3 C)  TempSrc:  Oral  Oral  SpO2:  100%  98%  Weight:    189 lb 6.4 oz (85.9 kg)  Height:        Intake/Output Summary (Last 24 hours) at 07/04/16 0809 Last data filed at 07/04/16 0634  Gross per 24 hour  Intake             1280 ml  Output             3550 ml  Net            -2270 ml    LABS: Basic Metabolic Panel:  Recent Labs  16/96/78 0319 07/04/16 0250  NA 136 133*  K 3.8 4.3  CL 91* 89*  CO2 32 30  GLUCOSE 111* 101*  BUN 29* 33*  CREATININE 1.52* 1.63*  CALCIUM 10.1 9.8  MG  --  2.1   Liver Function Tests: No results for input(s): AST, ALT, ALKPHOS, BILITOT, PROT, ALBUMIN in the last 72 hours. No results for input(s): LIPASE, AMYLASE in the last 72 hours. CBC:  Recent Labs  07/02/16 0258  WBC 7.0  HGB 14.8  HCT 46.7*  MCV 89.1  PLT 310   Cardiac Enzymes: No results for input(s): CKTOTAL, CKMB, CKMBINDEX, TROPONINI in the last 72  hours. BNP: Invalid input(s): POCBNP D-Dimer: No results for input(s): DDIMER in the last 72 hours. Hemoglobin A1C: No results for input(s): HGBA1C in the last 72 hours. Fasting Lipid Panel: No results for input(s): CHOL, HDL, LDLCALC, TRIG, CHOLHDL, LDLDIRECT in the last 72 hours. Thyroid Function Tests: No results for input(s): TSH, T4TOTAL, T3FREE, THYROIDAB in the last 72 hours.  Invalid input(s): FREET3 Anemia Panel: No results for input(s): VITAMINB12, FOLATE, FERRITIN, TIBC, IRON, RETICCTPCT in the last 72 hours.  RADIOLOGY: Dg Chest 2 View  Result Date: 06/26/2016 CLINICAL DATA:  Pt reports from PCP for abdomen and leg swelling. Pt states she has gained 15 pounds since April 2017. Pt c/o SOB and intermittent productive cough x a few months. Pt has been on two rounds of antibiotics for cough with no relief. Hx CHF and cardiac surgery. EXAM: CHEST  2 VIEW COMPARISON:  02/20/2016 FINDINGS: Cardiac silhouette is mildly enlarged. No mediastinal or hilar masses or evidence of adenopathy. Aorta is mildly uncoiled. Clear lungs.  No pleural effusion or pneumothorax. Skeletal structures  are intact. IMPRESSION: No acute cardiopulmonary disease.  Mild cardiomegaly. Electronically Signed   By: Amie Portland M.D.   On: 06/26/2016 15:25   Dg Abd 2 Views  Result Date: 06/26/2016 CLINICAL DATA:  Pt reports ongoing nausea, vomiting and weight gain. Pt states she has gained 15 pounds since April 2017. Pt denies constipation and diarrhea. EXAM: ABDOMEN - 2 VIEW COMPARISON:  Chest x-ray 06/26/2016 FINDINGS: There is paucity of bowel gas. The visualized bowel gas pattern is nonobstructive. No evidence for organomegaly. No abnormal calcifications. Visualized osseous structures have a normal appearance. IMPRESSION: No evidence for acute  abnormality. Electronically Signed   By: Norva Pavlov M.D.   On: 06/26/2016 18:55    PHYSICAL EXAM General: NAD. Sitting on the side  Neck: JVP 8 cm, no thyromegaly  or thyroid nodule.  Lungs: CTAB, normal effort. CV: Lateral PMI.  Heart regular S1/S2, no S3/S4, 2/6 HSM LLSB.  No edema.  Abdomen: Soft,NT, ND, no HSM. No bruits or masses. +BS  Neurologic: Alert and oriented x 3.  Psych: Normal affect. Extremities: No clubbing or cyanosis.   TELEMETRY: Reviewed, NSR 80s  ASSESSMENT AND PLAN: [name] is a 65 y.o. female with a history of HTN, HLD, tobacco abuse, CAD s/panterior STEMI with early stent thrombosis for missing doses of Brilinta s/p PCI x 2 into LAD (04/2014) and chronic systolic HF with EF 15% by 4/17 echo. She was admitted with marked volume overload in the setting of suspected medication noncompliance.Says her PCP told her to stop torsemide and started her on Lasix.  1. Acute on chronic systolic CHF: Ischemic cardiomyopathy.  Echo (4/17) with EF 15%, severe TR, moderate RV systolic dysfunction.  She was taking Lasix every other day and her PCP "was trying to get me off the water pill."  She has very poor insight into her condition which severely impairs her care.  Today, volume status appears much better.  Weight down considerably since admission.  BP lower today (SBP in 90s this morning).  Creatinine mildly higher.  - Stop IV Lasix today.  Will not give any diuretics today and probably start on torsemide 60 daily tomorrow.  - Continue low dose Coreg  - Continue  Entresto 24/26 mg BID.  - Continue spironolactone 25 mg daily.  - Decrease hydralazine to 12.5 tid, continue Imdur.   2. CAD:  S/panterior STEMI with early stent thrombosis for missing doses of Brilinta, s/p PCI x 2 into LAD (04/2014). - She has had apparent angioedema with ASA.  - Continue atorvastatin 80 mg daily and ticagrelor 60 mg BID. May be difficult to get her to continue as outpatient (had previously stopped on her own volition)  3. Noncompliance:  - Remains a significant issue.  Continue to recommend paramedicine after discharge.  - Think there is likely an underlying  psychiatric diagnosis.   - Again discussed importance of cardiac meds and compliance.  - Will aim to keep her regimen as straightforward as possible.  Continue to walk with cardiac rehab. Possibly home tomorrow if creatinine and BP stable. She will need close followup.   [name] 07/04/2016 8:09 AM   Advanced Heart Failure Team Pager [phone] (M-F; 7a - 4p)  Please contact [facility] for night-coverage after hours (4p -7a ) and weekends on [email or web address]

## 2016-07-05 LAB — BASIC METABOLIC PANEL
Anion gap: 17 — ABNORMAL HIGH (ref 5–15)
BUN: 39 mg/dL — ABNORMAL HIGH (ref 6–20)
CO2: 30 mmol/L (ref 22–32)
Calcium: 10 mg/dL (ref 8.9–10.3)
Chloride: 89 mmol/L — ABNORMAL LOW (ref 101–111)
Creatinine, Ser: 1.54 mg/dL — ABNORMAL HIGH (ref 0.44–1.00)
GFR calc Af Amer: 40 mL/min — ABNORMAL LOW (ref >60)
GFR calc non Af Amer: 35 mL/min — ABNORMAL LOW (ref >60)
Glucose, Bld: 93 mg/dL (ref 65–99)
Potassium: 3.5 mmol/L (ref 3.5–5.1)
Sodium: 136 mmol/L (ref 135–145)

## 2016-07-05 MED ORDER — POTASSIUM CHLORIDE CRYS ER 20 MEQ PO TBCR
20.0000 meq | EXTENDED_RELEASE_TABLET | Freq: Every day | ORAL | Status: DC
  Administered 2016-07-05 – 2016-07-06 (×2): 20 meq via ORAL
  Filled 2016-07-05 (×2): qty 1

## 2016-07-05 MED ORDER — TORSEMIDE 20 MG PO TABS
60.0000 mg | ORAL_TABLET | Freq: Every day | ORAL | Status: DC
  Administered 2016-07-05: 60 mg via ORAL
  Filled 2016-07-05: qty 3

## 2016-07-05 NOTE — Progress Notes (Signed)
Pt refuses for bed alarm to be turned on.

## 2016-07-05 NOTE — Progress Notes (Signed)
[facility]  PROGRESS NOTE  Deborah Ho RWE:[id number] DOB: 04/27/1951 DOA: 06/26/2016 PCP: Grayce Sessions, NP   Brief HPI:   65 y.o. female  with hx CAD, CHF (EF 15%), HTN, HLD p/w significant weight gain and exasperation about her condition.  Weight has increased significantly over the past two weeks - she has gained weight in her abdomen and legs.  She is unable to walk well because she feels so heavy.  She feels she has been compliant with her regimen and diet, though she did cut back her furosemide to every third day instead of every other day, and eats take out food including occasional fried chicken or fish, pizza, and has a hot dog every Saturday.  She coughs at night time, productive of clear sputum - this is unchanged.  Denies known fevers, CP, SOB, abdominal pain.  Has soreness in her legs from increased fluid.  She is feeling depressed because she keeps gaining fluid and she doesn't understand why.  She feels that she is doing everything she is supposed to be doing and is frustrated.  Denies SI.     Assessment/Plan:    1. Acute on chronic systolic CHF- echocardiogram showed EF 15%, continue diuresis per heart failure team.  HF Cardiology following.  Strict intake and output. Diuretic management per cardiology team. Net - 16 L.  Weight down to 189 lb now.  2. Hyponatremia - likely secondary to diuresis, may need to cut back some on diuretic, will defer to cardiology team; BMP in AM. 3. Abdominal distention- Resolved now.  KUB negative, no abdominal pain, nausea or vomiting. 4. Hypertension- blood pressure is stable, controlled, continue Cozaar, Coreg, Hydralazine.  5. CKD stage III- patient's baseline creatinine 1.3, holding stable.  Will closely monitor BMP as patient is diuresed. 6. Noncompliance - Pt counseled at bedside.  She is high risk for readmission secondary to this.  She seems motivated to try harder when she is discharged home.  Care mgmt consult to assist with  home medications.   DVT prophylaxis: Heparin Code Status: Full code Family Communication: No family at bedside Disposition Plan: Home when medically stable  Consultants:  Heart Failure Team    Subjective   Patient seen and examined, admitted with anasarca/fluid overload. Good diuresis with IV Lasix. Weight down.  Objective    Objective: Vitals:   07/04/16 2221 07/05/16 0345 07/05/16 0802 07/05/16 1028  BP: 126/80 116/72 120/69 111/60  Pulse: 93 92 93 85  Resp:  20  18  Temp:  97.3 F (36.3 C)  97.7 F (36.5 C)  TempSrc:  Oral  Oral  SpO2:  98%  98%  Weight:  86.8 kg (191 lb 4.8 oz)    Height:        Intake/Output Summary (Last 24 hours) at 07/05/16 1119 Last data filed at 07/05/16 0923  Gross per 24 hour  Intake              960 ml  Output             1600 ml  Net             -640 ml   Filed Weights   07/03/16 0455 07/04/16 0631 07/05/16 0345  Weight: 88.5 kg (195 lb 3.2 oz) 85.9 kg (189 lb 6.4 oz) 86.8 kg (191 lb 4.8 oz)    Examination:  General exam: Appears calm and comfortable  Respiratory system: Clear to auscultation bilaterally. Respiratory effort normal. Cardiovascular system: S1 & S2  heard, RRR. No  murmurs, rubs, gallops or clicks. Bilateral 2+edema in lower extremities Gastrointestinal system: Abdomen is nondistended, soft and nontender. No organomegaly or masses felt. Normal bowel sounds heard. Central nervous system: Alert and oriented. No focal neurological deficits. Extremities: Bilateral LE edema slightly improved from previous exam.  Skin: No rashes, lesions or ulcers Psychiatry: Judgement and insight appear normal. Mood & affect appropriate.   Data Reviewed: I have personally reviewed following labs and imaging studies Basic Metabolic Panel:  Recent Labs Lab 06/30/16 0320 07/01/16 0453 07/02/16 0258 07/03/16 0319 07/04/16 0250 07/05/16 0229  NA 139 138 136 136 133* 136  K 3.7 3.6 3.7 3.8 4.3 3.5  CL 100* 96* 93* 91* 89* 89*  CO2  28 31 30  32 30 30  GLUCOSE 108* 89 94 111* 101* 93  BUN 21* 22* 22* 29* 33* 39*  CREATININE 1.30* 1.33* 1.33* 1.52* 1.63* 1.54*  CALCIUM 9.1 9.6 9.7 10.1 9.8 10.0  MG 1.8  --   --   --  2.1  --    CBC:  Recent Labs Lab 07/02/16 0258  WBC 7.0  HGB 14.8  HCT 46.7*  MCV 89.1  PLT 310   Cardiac Enzymes: No results for input(s): CKTOTAL, CKMB, CKMBINDEX, TROPONINI in the last 168 hours. BNP (last 3 results)  Recent Labs  09/02/15 1000 02/20/16 1124 06/26/16 1441  BNP 263.3* 1,564.1* 1,443.5*   Studies: No results found.  Scheduled Meds: . atorvastatin  80 mg Oral q1800  . carvedilol  3.125 mg Oral BID WC  . enoxaparin (LOVENOX) injection  40 mg Subcutaneous Q24H  . hydrALAZINE  12.5 mg Oral Q8H  . isosorbide mononitrate  30 mg Oral Daily  . potassium chloride  20 mEq Oral Daily  . sacubitril-valsartan  1 tablet Oral BID  . sodium chloride flush  3 mL Intravenous Q12H  . spironolactone  25 mg Oral Daily  . ticagrelor  60 mg Oral BID  . torsemide  60 mg Oral Daily   Continuous Infusions:   Time spent: 15 min  Katalena Malveaux 08/26/16 [phone]. If 7PM-7AM, please contact night-coverage at [email or web address], Office  [phone]  password [email or web address] 07/05/2016, 11:19 AM  LOS: 7 days

## 2016-07-05 NOTE — Progress Notes (Signed)
Patient ID: Deborah Ho, female   DOB: 01-28-1951, 65 y.o.   MRN: [id number]    SUBJECTIVE:  BP stable today on lower dose hydralazine.  Diuretics held yesterday with creatinine up.  Creatinine down to 1.54 today.   Scheduled Meds: . atorvastatin  80 mg Oral q1800  . carvedilol  3.125 mg Oral BID WC  . enoxaparin (LOVENOX) injection  40 mg Subcutaneous Q24H  . hydrALAZINE  12.5 mg Oral Q8H  . isosorbide mononitrate  30 mg Oral Daily  . potassium chloride  20 mEq Oral Daily  . sacubitril-valsartan  1 tablet Oral BID  . sodium chloride flush  3 mL Intravenous Q12H  . spironolactone  25 mg Oral Daily  . ticagrelor  60 mg Oral BID  . torsemide  60 mg Oral Daily   Continuous Infusions:  PRN Meds:.sodium chloride, acetaminophen **OR** acetaminophen, famotidine, nitroGLYCERIN, ondansetron **OR** ondansetron (ZOFRAN) IV, polyethylene glycol, sodium chloride flush, traZODone    Vitals:   07/04/16 2000 07/04/16 2221 07/05/16 0345 07/05/16 0802  BP: 123/64 126/80 116/72 120/69  Pulse: 93 93 92 93  Resp: 20  20   Temp: 98.5 F (36.9 C)  97.3 F (36.3 C)   TempSrc: Oral  Oral   SpO2: 99%  98%   Weight:   191 lb 4.8 oz (86.8 kg)   Height:        Intake/Output Summary (Last 24 hours) at 07/05/16 0841 Last data filed at 07/05/16 0352  Gross per 24 hour  Intake              720 ml  Output             1600 ml  Net             -880 ml    LABS: Basic Metabolic Panel:  Recent Labs  10/62/69 0250 07/05/16 0229  NA 133* 136  K 4.3 3.5  CL 89* 89*  CO2 30 30  GLUCOSE 101* 93  BUN 33* 39*  CREATININE 1.63* 1.54*  CALCIUM 9.8 10.0  MG 2.1  --    Liver Function Tests: No results for input(s): AST, ALT, ALKPHOS, BILITOT, PROT, ALBUMIN in the last 72 hours. No results for input(s): LIPASE, AMYLASE in the last 72 hours. CBC: No results for input(s): WBC, NEUTROABS, HGB, HCT, MCV, PLT in the last 72 hours. Cardiac Enzymes: No results for input(s): CKTOTAL, CKMB, CKMBINDEX,  TROPONINI in the last 72 hours. BNP: Invalid input(s): POCBNP D-Dimer: No results for input(s): DDIMER in the last 72 hours. Hemoglobin A1C: No results for input(s): HGBA1C in the last 72 hours. Fasting Lipid Panel: No results for input(s): CHOL, HDL, LDLCALC, TRIG, CHOLHDL, LDLDIRECT in the last 72 hours. Thyroid Function Tests: No results for input(s): TSH, T4TOTAL, T3FREE, THYROIDAB in the last 72 hours.  Invalid input(s): FREET3 Anemia Panel: No results for input(s): VITAMINB12, FOLATE, FERRITIN, TIBC, IRON, RETICCTPCT in the last 72 hours.  RADIOLOGY: Dg Chest 2 View  Result Date: 06/26/2016 CLINICAL DATA:  Pt reports from PCP for abdomen and leg swelling. Pt states she has gained 15 pounds since April 2017. Pt c/o SOB and intermittent productive cough x a few months. Pt has been on two rounds of antibiotics for cough with no relief. Hx CHF and cardiac surgery. EXAM: CHEST  2 VIEW COMPARISON:  02/20/2016 FINDINGS: Cardiac silhouette is mildly enlarged. No mediastinal or hilar masses or evidence of adenopathy. Aorta is mildly uncoiled. Clear lungs.  No pleural effusion or pneumothorax.  Skeletal structures are intact. IMPRESSION: No acute cardiopulmonary disease.  Mild cardiomegaly. Electronically Signed   By: Amie Portland M.D.   On: 06/26/2016 15:25   Dg Abd 2 Views  Result Date: 06/26/2016 CLINICAL DATA:  Pt reports ongoing nausea, vomiting and weight gain. Pt states she has gained 15 pounds since April 2017. Pt denies constipation and diarrhea. EXAM: ABDOMEN - 2 VIEW COMPARISON:  Chest x-ray 06/26/2016 FINDINGS: There is paucity of bowel gas. The visualized bowel gas pattern is nonobstructive. No evidence for organomegaly. No abnormal calcifications. Visualized osseous structures have a normal appearance. IMPRESSION: No evidence for acute  abnormality. Electronically Signed   By: Norva Pavlov M.D.   On: 06/26/2016 18:55    PHYSICAL EXAM General: NAD. Sitting on the side  Neck:  JVP 8 cm, no thyromegaly or thyroid nodule.  Lungs: CTAB, normal effort. CV: Lateral PMI.  Heart regular S1/S2, no S3/S4, 2/6 HSM LLSB.  Trace ankle edema.  Abdomen: Soft,NT, ND, no HSM. No bruits or masses. +BS  Neurologic: Alert and oriented x 3.  Psych: Normal affect. Extremities: No clubbing or cyanosis.   TELEMETRY: Reviewed, NSR 80s  ASSESSMENT AND PLAN: Deborah Ho is a 65 y.o. female with a history of HTN, HLD, tobacco abuse, CAD s/panterior STEMI with early stent thrombosis for missing doses of Brilinta s/p PCI x 2 into LAD (04/2014) and chronic systolic HF with EF 15% by 4/17 echo. She was admitted with marked volume overload in the setting of suspected medication noncompliance.Says her PCP told her to stop torsemide and started her on Lasix.  1. Acute on chronic systolic CHF: Ischemic cardiomyopathy.  Echo (4/17) with EF 15%, severe TR, moderate RV systolic dysfunction.  She was taking Lasix every other day and her PCP "was trying to get me off the water pill."  She has very poor insight into her condition which severely impairs her care.  Today, volume status appears much better.  Weight down considerably since admission.  BP stable, creatinine down a bit after holding all diuretics yesterday.  - Can start torsemide 60 mg daily this afternoon.   - Continue low dose Coreg  - Continue  Entresto 24/26 mg BID.  - Continue spironolactone 25 mg daily.  - Continue hydralazine 12.5 tid and Imdur.   2. CAD:  S/panterior STEMI with early stent thrombosis for missing doses of Brilinta, s/p PCI x 2 into LAD (04/2014). - She has had apparent angioedema with ASA.  - Continue atorvastatin 80 mg daily and ticagrelor 60 mg BID. May be difficult to get her to continue as outpatient (had previously stopped on her own volition)  3. Noncompliance:  - Remains a significant issue.  Continue to recommend paramedicine after discharge.  - Think there is likely an underlying psychiatric diagnosis.   -  Again discussed importance of cardiac meds and compliance.  - Will aim to keep her regimen as straightforward as possible.  Continue to walk with cardiac rehab. Would send home tomorrow if renal function and BP stable after starting po diuretic this afternoon. Will need close followup and paramedicine.  [name] 07/05/2016 8:41 AM   Advanced Heart Failure Team Pager [phone] (M-F; 7a - 4p)  Please contact [facility] for night-coverage after hours (4p -7a ) and weekends on [email or web address]

## 2016-07-06 DIAGNOSIS — N179 Acute kidney failure, unspecified: Secondary | ICD-10-CM

## 2016-07-06 LAB — BASIC METABOLIC PANEL
Anion gap: 12 (ref 5–15)
BUN: 48 mg/dL — ABNORMAL HIGH (ref 6–20)
CO2: 28 mmol/L (ref 22–32)
Calcium: 9.5 mg/dL (ref 8.9–10.3)
Chloride: 93 mmol/L — ABNORMAL LOW (ref 101–111)
Creatinine, Ser: 1.63 mg/dL — ABNORMAL HIGH (ref 0.44–1.00)
GFR calc Af Amer: 37 mL/min — ABNORMAL LOW (ref >60)
GFR calc non Af Amer: 32 mL/min — ABNORMAL LOW (ref >60)
Glucose, Bld: 132 mg/dL — ABNORMAL HIGH (ref 65–99)
Potassium: 3.5 mmol/L (ref 3.5–5.1)
Sodium: 133 mmol/L — ABNORMAL LOW (ref 135–145)

## 2016-07-06 MED ORDER — ISOSORBIDE MONONITRATE ER 30 MG PO TB24: 30.0000 mg | ORAL_TABLET | Freq: Every day | ORAL | 6 refills | Status: DC

## 2016-07-06 MED ORDER — TORSEMIDE 20 MG PO TABS: 60.0000 mg | ORAL_TABLET | Freq: Every day | ORAL | 6 refills | Status: DC

## 2016-07-06 MED ORDER — HYDRALAZINE HCL 25 MG PO TABS: 12.5000 mg | ORAL_TABLET | Freq: Three times a day (TID) | ORAL | 6 refills | Status: DC

## 2016-07-06 MED ORDER — SPIRONOLACTONE 25 MG PO TABS: 25.0000 mg | ORAL_TABLET | Freq: Every day | ORAL | 6 refills | Status: DC

## 2016-07-06 MED ORDER — ATORVASTATIN CALCIUM 80 MG PO TABS: 80.0000 mg | ORAL_TABLET | Freq: Every day | ORAL | 0 refills | Status: DC

## 2016-07-06 MED ORDER — POTASSIUM CHLORIDE CRYS ER 20 MEQ PO TBCR: 20.0000 meq | EXTENDED_RELEASE_TABLET | Freq: Every day | ORAL | 6 refills | Status: DC

## 2016-07-06 MED ORDER — TICAGRELOR 60 MG PO TABS: 60.0000 mg | ORAL_TABLET | Freq: Two times a day (BID) | ORAL | 6 refills | Status: DC

## 2016-07-06 MED ORDER — CARVEDILOL 3.125 MG PO TABS: 3.1250 mg | ORAL_TABLET | Freq: Two times a day (BID) | ORAL | 6 refills | Status: DC

## 2016-07-06 MED ORDER — SACUBITRIL-VALSARTAN 24-26 MG PO TABS: 1.0000 | ORAL_TABLET | Freq: Two times a day (BID) | ORAL | 6 refills | Status: DC

## 2016-07-06 NOTE — Progress Notes (Signed)
Patient ID: Deborah Ho, female   DOB: 1951/08/11, 65 y.o.   MRN: [id number]    SUBJECTIVE:  BP stable.  BUN/Creatinine up a bit today.  Weight stable.  Feels good.   Scheduled Meds: . atorvastatin  80 mg Oral q1800  . carvedilol  3.125 mg Oral BID WC  . enoxaparin (LOVENOX) injection  40 mg Subcutaneous Q24H  . hydrALAZINE  12.5 mg Oral Q8H  . isosorbide mononitrate  30 mg Oral Daily  . potassium chloride  20 mEq Oral Daily  . sacubitril-valsartan  1 tablet Oral BID  . sodium chloride flush  3 mL Intravenous Q12H  . spironolactone  25 mg Oral Daily  . ticagrelor  60 mg Oral BID   Continuous Infusions:  PRN Meds:.sodium chloride, acetaminophen **OR** acetaminophen, famotidine, nitroGLYCERIN, ondansetron **OR** ondansetron (ZOFRAN) IV, polyethylene glycol, sodium chloride flush, traZODone    Vitals:   07/05/16 1414 07/05/16 1600 07/05/16 2116 07/06/16 0459  BP: (!) 115/56 114/60 (!) 118/56 118/68  Pulse: 85 87 83 85  Resp:   20 20  Temp:   98.1 F (36.7 C) 98.5 F (36.9 C)  TempSrc:   Oral Oral  SpO2:   97% 96%  Weight:    190 lb 9.6 oz (86.5 kg)  Height:        Intake/Output Summary (Last 24 hours) at 07/06/16 0739 Last data filed at 07/05/16 2133  Gross per 24 hour  Intake             1203 ml  Output             1300 ml  Net              -97 ml    LABS: Basic Metabolic Panel:  Recent Labs  33/82/50 0250 07/05/16 0229 07/06/16 0418  NA 133* 136 133*  K 4.3 3.5 3.5  CL 89* 89* 93*  CO2 30 30 28   GLUCOSE 101* 93 132*  BUN 33* 39* 48*  CREATININE 1.63* 1.54* 1.63*  CALCIUM 9.8 10.0 9.5  MG 2.1  --   --    Liver Function Tests: No results for input(s): AST, ALT, ALKPHOS, BILITOT, PROT, ALBUMIN in the last 72 hours. No results for input(s): LIPASE, AMYLASE in the last 72 hours. CBC: No results for input(s): WBC, NEUTROABS, HGB, HCT, MCV, PLT in the last 72 hours. Cardiac Enzymes: No results for input(s): CKTOTAL, CKMB, CKMBINDEX, TROPONINI in the last  72 hours. BNP: Invalid input(s): POCBNP D-Dimer: No results for input(s): DDIMER in the last 72 hours. Hemoglobin A1C: No results for input(s): HGBA1C in the last 72 hours. Fasting Lipid Panel: No results for input(s): CHOL, HDL, LDLCALC, TRIG, CHOLHDL, LDLDIRECT in the last 72 hours. Thyroid Function Tests: No results for input(s): TSH, T4TOTAL, T3FREE, THYROIDAB in the last 72 hours.  Invalid input(s): FREET3 Anemia Panel: No results for input(s): VITAMINB12, FOLATE, FERRITIN, TIBC, IRON, RETICCTPCT in the last 72 hours.  RADIOLOGY: Dg Chest 2 View  Result Date: 06/26/2016 CLINICAL DATA:  Pt reports from PCP for abdomen and leg swelling. Pt states she has gained 15 pounds since April 2017. Pt c/o SOB and intermittent productive cough x a few months. Pt has been on two rounds of antibiotics for cough with no relief. Hx CHF and cardiac surgery. EXAM: CHEST  2 VIEW COMPARISON:  02/20/2016 FINDINGS: Cardiac silhouette is mildly enlarged. No mediastinal or hilar masses or evidence of adenopathy. Aorta is mildly uncoiled. Clear lungs.  No pleural effusion or  pneumothorax. Skeletal structures are intact. IMPRESSION: No acute cardiopulmonary disease.  Mild cardiomegaly. Electronically Signed   By: Amie Portland M.D.   On: 06/26/2016 15:25   Dg Abd 2 Views  Result Date: 06/26/2016 CLINICAL DATA:  Pt reports ongoing nausea, vomiting and weight gain. Pt states she has gained 15 pounds since April 2017. Pt denies constipation and diarrhea. EXAM: ABDOMEN - 2 VIEW COMPARISON:  Chest x-ray 06/26/2016 FINDINGS: There is paucity of bowel gas. The visualized bowel gas pattern is nonobstructive. No evidence for organomegaly. No abnormal calcifications. Visualized osseous structures have a normal appearance. IMPRESSION: No evidence for acute  abnormality. Electronically Signed   By: Norva Pavlov M.D.   On: 06/26/2016 18:55    PHYSICAL EXAM General: NAD. Sitting on the side  Neck: JVP 8 cm, no  thyromegaly or thyroid nodule.  Lungs: CTAB, normal effort. CV: Lateral PMI.  Heart regular S1/S2, no S3/S4, 2/6 HSM LLSB.  Trace ankle edema.  Abdomen: Soft,NT, ND, no HSM. No bruits or masses. +BS  Neurologic: Alert and oriented x 3.  Psych: Normal affect. Extremities: No clubbing or cyanosis.   TELEMETRY: Reviewed, NSR 80s  ASSESSMENT AND PLAN: Deborah Ho is a 65 y.o. female with a history of HTN, HLD, tobacco abuse, CAD s/panterior STEMI with early stent thrombosis for missing doses of Brilinta s/p PCI x 2 into LAD (04/2014) and chronic systolic HF with EF 15% by 4/17 echo. She was admitted with marked volume overload in the setting of suspected medication noncompliance.Says her PCP told her to stop torsemide and started her on Lasix.  1. Acute on chronic systolic CHF: Ischemic cardiomyopathy.  Echo (4/17) with EF 15%, severe TR, moderate RV systolic dysfunction.  She was taking Lasix every other day and her PCP "was trying to get me off the water pill."  She has very poor insight into her condition which severely impairs her care.  Today, volume status appears much better.  Weight down considerably since admission.  BP stable, BUN/creatinne up today.  - Hold torsemide today, restart Wednesday with KCl 20 daily.   - Continue low dose Coreg  - Continue  Entresto 24/26 mg BID.  - Continue spironolactone 25 mg daily.  - Continue hydralazine 12.5 tid and Imdur.   2. CAD:  S/panterior STEMI with early stent thrombosis for missing doses of Brilinta, s/p PCI x 2 into LAD (04/2014). - She has had apparent angioedema with ASA.  - Continue atorvastatin 80 mg daily and ticagrelor 60 mg BID. May be difficult to get her to continue as outpatient (had previously stopped on her own volition)  3. Noncompliance:  - Remains a significant issue.  Continue to recommend paramedicine after discharge.  - Think there is likely an underlying psychiatric diagnosis.   - Again discussed importance of cardiac  meds and compliance.  - Will aim to keep her regimen as straightforward as possible. 4. Disposition: Plan for home today.  Has followup scheduled in CHF clinic.  Will try to arrange for paramedicine to follow.  Cardiac meds for home: Torsemide 60 daily (to start Wednesday), KCl 20 daily (to start Wednesday), hydralazine 12.5 tid, Imdur 30 daily, Coreg 3.125 bid, Entresto 24/26 bid, spironolactone 25 daily, ticagrelor 60 bid, atorvastatin 80 daily. She will need a lot of review of her meds prior to discharge.   [name] 07/06/2016 7:39 AM   Advanced Heart Failure Team Pager [phone] (M-F; 7a - 4p)  Please contact [facility] for night-coverage after hours (4p -7a )  and weekends on [email or web address]  

## 2016-07-06 NOTE — Progress Notes (Signed)
Orders received for pt discharge.  Discharge summary printed and reviewed with pt.  Explained medication regimen, and pt had no further questions at this time.  IV removed and site remains clean, dry, intact.  Telemetry removed.  Pt in stable condition and awaiting transport. 

## 2016-07-06 NOTE — Progress Notes (Signed)
CARDIAC REHAB PHASE I   PRE:  Rate/Rhythm: 86 SR  BP:  Supine:   Sitting: 114/56  Standing:    SaO2: 98%RA  MODE:  Ambulation: 740 ft   POST:  Rate/Rhythm: 92 SR  BP:  Supine:   Sitting: 130/67  Standing:    SaO2: 97%RA 0830-0927 Pt walked 740 ft with steady gait. No DOE noted. Pt was given CHF booklet and reviewed zones especially when to call MD. She had not been weighing everyday so this was encouraged. Discussed when to call MD with weight gain. Discussed 2000 mg sodium restriction and gave handouts. Pt with many questions re sodium restriction. She is going to start writing down what she eats and sodium content. Encouraged her to keep visits with Heart Failure clinic and take her meds as directed. Reviewed ex ed and wrote down ex ed. She stated she did not attend CRP 2 before because of insurance coverage but is going to be on Medicare soon and wanted to refer again.  Will send referral to [place]. Encouraged not to smoke and she stated that was not a problem as she smoke infrequently and did not have to smoke. Discussed FR of 2L. Pt voiced understanding of all ed and could answer teach back.   [name], RN BSN  07/06/2016 9:20 AM

## 2016-07-06 NOTE — Progress Notes (Signed)
[facility] called for discharge home today / HHRN. [name] [facility] [phone]

## 2016-07-06 NOTE — Discharge Instructions (Signed)
Heart Failure  Heart failure means your heart has trouble pumping blood. This makes it hard for your body to work well. Heart failure is usually a long-term (chronic) condition. You must take good care of yourself and follow your doctor's treatment plan.  HOME CARE   Take your heart medicine as told by your doctor.    Do not stop taking medicine unless your doctor tells you to.    Do not skip any dose of medicine.    Refill your medicines before they run out.    Take other medicines only as told by your doctor or pharmacist.   Stay active if told by your doctor. The elderly and people with severe heart failure should talk with a doctor about physical activity.   Eat heart-healthy foods. Choose foods that are without trans fat and are low in saturated fat, cholesterol, and salt (sodium). This includes fresh or frozen fruits and vegetables, fish, lean meats, fat-free or low-fat dairy foods, whole grains, and high-fiber foods. Lentils and dried peas and beans (legumes) are also good choices.   Limit salt if told by your doctor.   Cook in a healthy way. Roast, grill, broil, bake, poach, steam, or stir-fry foods.   Limit fluids as told by your doctor.   Weigh yourself every morning. Do this after you pee (urinate) and before you eat breakfast. Write down your weight to give to your doctor.   Take your blood pressure and write it down if your doctor tells you to.   Ask your doctor how to check your pulse. Check your pulse as told.   Lose weight if told by your doctor.   Stop smoking or chewing tobacco. Do not use gum or patches that help you quit without your doctor's approval.   Schedule and go to doctor visits as told.   Nonpregnant women should have no more than 1 drink a day. Men should have no more than 2 drinks a day. Talk to your doctor about drinking alcohol.   Stop illegal drug use.   Stay current with shots (immunizations).   Manage your health conditions as told by your doctor.   Learn to  manage your stress.   Rest when you are tired.   If it is really hot outside:    Avoid intense activities.    Use air conditioning or fans, or get in a cooler place.    Avoid caffeine and alcohol.    Wear loose-fitting, lightweight, and light-colored clothing.   If it is really cold outside:    Avoid intense activities.    Layer your clothing.    Wear mittens or gloves, a hat, and a scarf when going outside.    Avoid alcohol.   Learn about heart failure and get support as needed.   Get help to maintain or improve your quality of life and your ability to care for yourself as needed.  GET HELP IF:    You gain weight quickly.   You are more short of breath than usual.   You cannot do your normal activities.   You tire easily.   You cough more than normal, especially with activity.   You have any or more puffiness (swelling) in areas such as your hands, feet, ankles, or belly (abdomen).   You cannot sleep because it is hard to breathe.   You feel like your heart is beating fast (palpitations).   You get dizzy or light-headed when you stand up.  GET HELP   are not doing well or get worse.   This information is not intended to replace advice given to you by your health care provider. Make sure you discuss any questions you have with your health care provider.   Document Released: 08/11/2008 Document Revised: 11/23/2014 Document Reviewed: 12/19/2012 Elsevier Interactive Patient Education 2016 [employer]   Edema Edema is an abnormal buildup of fluids. It is more common in your legs and thighs. Painless swelling of the feet and ankles is more likely as a person ages. It also is common in looser  skin, like around your eyes. HOME CARE   Keep the affected body part above the level of the heart while lying down.  Do not sit still or stand for a long time.  Do not put anything right under your knees when you lie down.  Do not wear tight clothes on your upper legs.  Exercise your legs to help the puffiness (swelling) go down.  Wear elastic bandages or support stockings as told by your doctor.  A low-salt diet may help lessen the puffiness.  Only take medicine as told by your doctor. GET HELP IF:  Treatment is not working.  You have heart, liver, or kidney disease and notice that your skin looks puffy or shiny.  You have puffiness in your legs that does not get better when you raise your legs.  You have sudden weight gain for no reason. GET HELP RIGHT AWAY IF:   You have shortness of breath or chest pain.  You cannot breathe when you lie down.  You have pain, redness, or warmth in the areas that are puffy.  You have heart, liver, or kidney disease and get edema all of a sudden.  You have a fever and your symptoms get worse all of a sudden. MAKE SURE YOU:   Understand these instructions.  Will watch your condition.  Will get help right away if you are not doing well or get worse.   This information is not intended to replace advice given to you by your health care provider. Make sure you discuss any questions you have with your health care provider.   Document Released: 04/20/2008 Document Revised: 11/07/2013 Document Reviewed: 08/25/2013 Elsevier Interactive Patient Education 2016 [employer]  Peripheral Edema You have swelling in your legs (peripheral edema). This swelling is due to excess accumulation of salt and water in your body. Edema may be a sign of heart, kidney or liver disease, or a side effect of a medication. It may also be due to problems in the leg veins. Elevating your legs and using special support stockings may be very helpful, if the cause  of the swelling is due to poor venous circulation. Avoid long periods of standing, whatever the cause. Treatment of edema depends on identifying the cause. Chips, pretzels, pickles and other salty foods should be avoided. Restricting salt in your diet is almost always needed. Water pills (diuretics) are often used to remove the excess salt and water from your body via urine. These medicines prevent the kidney from reabsorbing sodium. This increases urine flow. Diuretic treatment may also result in lowering of potassium levels in your body. Potassium supplements may be needed if you have to use diuretics daily. Daily weights can help you keep track of your progress in clearing your edema. You should call your caregiver for follow up care as recommended. SEEK IMMEDIATE MEDICAL CARE IF:   You have increased swelling, pain, redness, or heat in your legs.  You develop shortness of breath, especially when lying down.  You develop chest or abdominal pain, weakness, or fainting.  You have a fever.   This information is not intended to replace advice given to you by your health care provider. Make sure you discuss any questions you have with your health care provider.   Document Released: 12/10/2004 Document Revised: 01/25/2012 Document Reviewed: 05/15/2015 Elsevier Interactive Patient Education [employer]

## 2016-07-06 NOTE — Discharge Summary (Signed)
Physician Discharge Summary  Deborah Ho [name]:[id number] DOB: 06/23/1951 DOA: 06/26/2016  PCP: Grayce Sessions, NP  Admit date: 06/26/2016 Discharge date: 07/06/2016  Admitted From: Home Disposition:  Home  Recommendations for Outpatient Follow-up:  1. Follow up with PCP in 1 weeks 2. Please followup with cardiology as scheduled  Discharge Condition: Stable CODE STATUS: DNR  Brief/Interim Summary: Brief HPI:   65 y.o.female  with hx CAD, CHF (EF 15%), HTN, HLD p/w significant weight gain and exasperation about her condition. Weight has increased significantly over the past two weeks - she has gained weight in her abdomen and legs. She is unable to walk well because she feels so heavy. She feels she has been compliant with her regimen and diet, though she did cut back her furosemide to every third day instead of every other day, and eats take out food including occasional fried chicken or fish, pizza, and has a hot dog every Saturday. She coughs at night time, productive of clear sputum - this is unchanged. Denies known fevers, CP, SOB, abdominal pain. Has soreness in her legs from increased fluid.  She is feeling depressed because she keeps gaining fluid and she doesn't understand why. She feels that she is doing everything she is supposed to be doing and is frustrated. Denies SI.     Assessment/Plan:    1. Acute on chronic systolic CHF- echocardiogram showed EF 15%, continue diuresis per heart failure team.  HF Cardiology following.  Strict intake and output. Diuretic management per cardiology team. Net - 16.7 L.  Weight down to 190 lb.  2. Hyponatremia - likely secondary to diuresis, but stable and mild, repeat BMP on outpatient follow up.  3. Abdominal distention- Resolved now.  KUB negative, no abdominal pain, nausea or vomiting. 4. Hypertension- blood pressure is stable, controlled, continue current cardiac med regimen.  5. CKD stage III- patient's baseline  creatinine 1.3-1.6 holding stable.   6. Noncompliance - Pt counseled at bedside.  She is high risk for readmission secondary to this.  She seems motivated to try harder when she is discharged home.  Care mgmt consult to assist with home medications.   DVT prophylaxis: Heparin Code Status: Full code Family Communication: No family at bedside Disposition Plan: Home   Consultants:  Heart Failure Team     Discharge Diagnoses:  Principal Problem:   Anasarca Active Problems:   HLD (hyperlipidemia)   Tobacco abuse   HTN (hypertension)   CAD (coronary artery disease)   Abdominal distension   Peripheral edema    Discharge Instructions  Discharge Instructions    Diet - low sodium heart healthy    Complete by:  As directed   Increase activity slowly    Complete by:  As directed       Medication List    STOP taking these medications   benzonatate 100 MG capsule Commonly known as:  TESSALON   cetirizine 10 MG tablet Commonly known as:  ZYRTEC   furosemide 40 MG tablet Commonly known as:  LASIX   lisinopril 2.5 MG tablet Commonly known as:  PRINIVIL,ZESTRIL   losartan 50 MG tablet Commonly known as:  COZAAR   Magnesium Oxide 400 (240 Mg) MG Tabs   metolazone 2.5 MG tablet Commonly known as:  ZAROXOLYN   ondansetron 4 MG disintegrating tablet Commonly known as:  ZOFRAN ODT   PEPCID AC 10 MG chewable tablet Generic drug:  famotidine   potassium chloride 10 MEQ tablet Commonly known as:  K-DUR  VITAMIN D3 PO     TAKE these medications   atorvastatin 80 MG tablet Commonly known as:  LIPITOR Take 1 tablet (80 mg total) by mouth daily at 6 PM.   carvedilol 3.125 MG tablet Commonly known as:  COREG Take 1 tablet (3.125 mg total) by mouth 2 (two) times daily with a meal. What changed:  when to take this   hydrALAZINE 25 MG tablet Commonly known as:  APRESOLINE Take 0.5 tablets (12.5 mg total) by mouth every 8 (eight) hours. What changed:  how much to  take  when to take this   isosorbide mononitrate 30 MG 24 hr tablet Commonly known as:  IMDUR Take 1 tablet (30 mg total) by mouth daily. What changed:  how much to take   nitroGLYCERIN 0.4 MG SL tablet Commonly known as:  NITROSTAT Place 0.4 mg under the tongue every 5 (five) minutes as needed for chest pain.   potassium chloride SA 20 MEQ tablet Commonly known as:  K-DUR,KLOR-CON Take 1 tablet (20 mEq total) by mouth daily. Start taking on:  07/08/2016 What changed:  how much to take  how to take this  when to take this  additional instructions   sacubitril-valsartan 24-26 MG Commonly known as:  ENTRESTO Take 1 tablet by mouth 2 (two) times daily.   spironolactone 25 MG tablet Commonly known as:  ALDACTONE Take 1 tablet (25 mg total) by mouth daily.   ticagrelor 60 MG Tabs tablet Commonly known as:  BRILINTA Take 1 tablet (60 mg total) by mouth 2 (two) times daily.   torsemide 20 MG tablet Commonly known as:  DEMADEX Take 3 tablets (60 mg total) by mouth daily. Start taking on:  07/08/2016 What changed:  how much to take  when to take this      Follow-up Information    [facility] .   Why:  A nurse will visit you at your home Contact information: [address] [place] [address] [phone]        [facility] Follow up on 07/16/2016.   Specialty:  Cardiology Why:  at 1000 am for post hospital follow up. Please bring all of your medications to your visit. The code for parking is 0003. Contact information: [address] [id number] mc [place] [address] [phone]       Wilhelmina Mcardle, RN .   Specialty:  Student Why:  Come to the [facility] for your heart failure education session on Thursday, August 31st at 9:00 am.  The code for parking is 0003.       EDWARDS, MICHELLE P, NP. Schedule an appointment as soon as possible  for a visit in 1 week(s).   Specialty:  Internal Medicine Why:  Hospital Follow Up Contact information: [address] [place] [address] [phone]          Allergies  Allergen Reactions  . Aspirin Swelling    Chewable children's aspirin makes patients tongue and face swell  . Effient [Prasugrel] Swelling    Patient's tongue and face swells  . Lactose Intolerance (Gi) Other (See Comments)    REACTION: stomach upset  . Robitussin Dm [Guaifenesin-Dm] Swelling    Patient's tongue swells  . Sulfa Antibiotics Swelling  . Wheat Bran Other (See Comments)    REACTION: unknown    Procedures/Studies: Dg Chest 2 View  Result Date: 06/26/2016 CLINICAL DATA:  Pt reports from PCP for  abdomen and leg swelling. Pt states she has gained 15 pounds since April 2017. Pt c/o SOB and intermittent productive cough x a few months. Pt has been on two rounds of antibiotics for cough with no relief. Hx CHF and cardiac surgery. EXAM: CHEST  2 VIEW COMPARISON:  02/20/2016 FINDINGS: Cardiac silhouette is mildly enlarged. No mediastinal or hilar masses or evidence of adenopathy. Aorta is mildly uncoiled. Clear lungs.  No pleural effusion or pneumothorax. Skeletal structures are intact. IMPRESSION: No acute cardiopulmonary disease.  Mild cardiomegaly. Electronically Signed   By: Amie Portland M.D.   On: 06/26/2016 15:25   Dg Abd 2 Views  Result Date: 06/26/2016 CLINICAL DATA:  Pt reports ongoing nausea, vomiting and weight gain. Pt states she has gained 15 pounds since April 2017. Pt denies constipation and diarrhea. EXAM: ABDOMEN - 2 VIEW COMPARISON:  Chest x-ray 06/26/2016 FINDINGS: There is paucity of bowel gas. The visualized bowel gas pattern is nonobstructive. No evidence for organomegaly. No abnormal calcifications. Visualized osseous structures have a normal appearance. IMPRESSION: No evidence for acute  abnormality. Electronically Signed   By: Norva Pavlov M.D.   On: 06/26/2016 18:55     Subjective: Pt feeling much better.  No SOB. Weight down.   Discharge Exam: Vitals:   07/05/16 2116 07/06/16 0459  BP: (!) 118/56 118/68  Pulse: 83 85  Resp: 20 20  Temp: 98.1 F (36.7 C) 98.5 F (36.9 C)   Vitals:   07/05/16 1414 07/05/16 1600 07/05/16 2116 07/06/16 0459  BP: (!) 115/56 114/60 (!) 118/56 118/68  Pulse: 85 87 83 85  Resp:   20 20  Temp:   98.1 F (36.7 C) 98.5 F (36.9 C)  TempSrc:   Oral Oral  SpO2:   97% 96%  Weight:    86.5 kg (190 lb 9.6 oz)  Height:       General exam: Appears calm and comfortable  Respiratory system: Clear to auscultation bilaterally. Respiratory effort normal. Cardiovascular system: S1 & S2 heard, RRR. No  murmurs, rubs, gallops or clicks. Trace ankle edema.  Gastrointestinal system: Abdomen is nondistended, soft and nontender. No organomegaly or masses felt. Normal bowel sounds heard. Central nervous system: Alert and oriented. No focal neurological deficits. Extremities: trace ankle edema bilateral.  Skin: No rashes, lesions or ulcers Psychiatry: Judgement and insight appear normal. Mood & affect appropriate.    The results of significant diagnostics from this hospitalization (including imaging, microbiology, ancillary and laboratory) are listed below for reference.     Microbiology: No results found for this or any previous visit (from the past 240 hour(s)).   Labs: BNP (last 3 results)  Recent Labs  09/02/15 1000 02/20/16 1124 06/26/16 1441  BNP 263.3* 1,564.1* 1,443.5*   Basic Metabolic Panel:  Recent Labs Lab 06/30/16 0320  07/02/16 0258 07/03/16 0319 07/04/16 0250 07/05/16 0229 07/06/16 0418  NA 139  < > 136 136 133* 136 133*  K 3.7  < > 3.7 3.8 4.3 3.5 3.5  CL 100*  < > 93* 91* 89* 89* 93*  CO2 28  < > 30 32 30 30 28   GLUCOSE 108*  < > 94 111* 101* 93 132*  BUN 21*  < > 22* 29* 33* 39* 48*  CREATININE 1.30*  < > 1.33* 1.52* 1.63* 1.54* 1.63*  CALCIUM 9.1  < > 9.7 10.1 9.8 10.0 9.5  MG 1.8  --    --   --  2.1  --   --   < > =  values in this interval not displayed. Liver Function Tests: No results for input(s): AST, ALT, ALKPHOS, BILITOT, PROT, ALBUMIN in the last 168 hours. No results for input(s): LIPASE, AMYLASE in the last 168 hours. No results for input(s): AMMONIA in the last 168 hours. CBC:  Recent Labs Lab 07/02/16 0258  WBC 7.0  HGB 14.8  HCT 46.7*  MCV 89.1  PLT 310   Cardiac Enzymes: No results for input(s): CKTOTAL, CKMB, CKMBINDEX, TROPONINI in the last 168 hours. BNP: Invalid input(s): POCBNP CBG: No results for input(s): GLUCAP in the last 168 hours. D-Dimer No results for input(s): DDIMER in the last 72 hours. Hgb A1c No results for input(s): HGBA1C in the last 72 hours. Lipid Profile No results for input(s): CHOL, HDL, LDLCALC, TRIG, CHOLHDL, LDLDIRECT in the last 72 hours. Thyroid function studies No results for input(s): TSH, T4TOTAL, T3FREE, THYROIDAB in the last 72 hours.  Invalid input(s): FREET3 Anemia work up No results for input(s): VITAMINB12, FOLATE, FERRITIN, TIBC, IRON, RETICCTPCT in the last 72 hours. Urinalysis    Component Value Date/Time   COLORURINE AMBER (A) 02/20/2016 1454   APPEARANCEUR CLOUDY (A) 02/20/2016 1454   LABSPEC 1.025 02/20/2016 1454   PHURINE 5.0 02/20/2016 1454   GLUCOSEU NEGATIVE 02/20/2016 1454   HGBUR NEGATIVE 02/20/2016 1454   BILIRUBINUR SMALL (A) 02/20/2016 1454   KETONESUR NEGATIVE 02/20/2016 1454   PROTEINUR 100 (A) 02/20/2016 1454   UROBILINOGEN 1.0 07/25/2015 1601   NITRITE NEGATIVE 02/20/2016 1454   LEUKOCYTESUR SMALL (A) 02/20/2016 1454   Sepsis Labs Invalid input(s): PROCALCITONIN,  WBC,  LACTICIDVEN Microbiology No results found for this or any previous visit (from the past 240 hour(s)).  Time coordinating discharge: 40 minutes  SIGNED:  Standley Dakins, MD  [facility] 07/06/2016, 9:32 AM Pager   If 7PM-7AM, please contact night-coverage [email or web address] Password [email or web address]

## 2016-07-16 ENCOUNTER — Ambulatory Visit (HOSPITAL_COMMUNITY)
Admit: 2016-07-16 | Discharge: 2016-07-16 | Disposition: A | Discharge status: Home / Self Care | Payer: BLUE CROSS/BLUE SHIELD | Source: Ambulatory Visit | Attending: Internal Medicine | Admitting: Internal Medicine

## 2016-07-16 VITALS — BP 134/90 | HR 102 | Wt 189.8 lb

## 2016-07-16 DIAGNOSIS — I252 Old myocardial infarction: Secondary | ICD-10-CM | POA: Diagnosis not present

## 2016-07-16 DIAGNOSIS — Z8249 Family history of ischemic heart disease and other diseases of the circulatory system: Secondary | ICD-10-CM | POA: Insufficient documentation

## 2016-07-16 DIAGNOSIS — F17210 Nicotine dependence, cigarettes, uncomplicated: Secondary | ICD-10-CM | POA: Insufficient documentation

## 2016-07-16 DIAGNOSIS — E785 Hyperlipidemia, unspecified: Secondary | ICD-10-CM | POA: Insufficient documentation

## 2016-07-16 DIAGNOSIS — I5023 Acute on chronic systolic (congestive) heart failure: Secondary | ICD-10-CM

## 2016-07-16 DIAGNOSIS — Z955 Presence of coronary angioplasty implant and graft: Secondary | ICD-10-CM | POA: Diagnosis not present

## 2016-07-16 DIAGNOSIS — I2510 Atherosclerotic heart disease of native coronary artery without angina pectoris: Secondary | ICD-10-CM | POA: Insufficient documentation

## 2016-07-16 DIAGNOSIS — I5022 Chronic systolic (congestive) heart failure: Secondary | ICD-10-CM | POA: Diagnosis not present

## 2016-07-16 DIAGNOSIS — Z833 Family history of diabetes mellitus: Secondary | ICD-10-CM | POA: Insufficient documentation

## 2016-07-16 DIAGNOSIS — Z7902 Long term (current) use of antithrombotics/antiplatelets: Secondary | ICD-10-CM | POA: Insufficient documentation

## 2016-07-16 DIAGNOSIS — Z79899 Other long term (current) drug therapy: Secondary | ICD-10-CM | POA: Diagnosis not present

## 2016-07-16 DIAGNOSIS — I255 Ischemic cardiomyopathy: Secondary | ICD-10-CM | POA: Diagnosis not present

## 2016-07-16 DIAGNOSIS — I10 Essential (primary) hypertension: Secondary | ICD-10-CM | POA: Diagnosis not present

## 2016-07-16 DIAGNOSIS — Z9114 Patient's other noncompliance with medication regimen: Secondary | ICD-10-CM

## 2016-07-16 DIAGNOSIS — I110 Hypertensive heart disease with heart failure: Secondary | ICD-10-CM | POA: Insufficient documentation

## 2016-07-16 LAB — BASIC METABOLIC PANEL
Anion gap: 14 (ref 5–15)
BUN: 32 mg/dL — ABNORMAL HIGH (ref 6–20)
CO2: 22 mmol/L (ref 22–32)
Calcium: 10.3 mg/dL (ref 8.9–10.3)
Chloride: 104 mmol/L (ref 101–111)
Creatinine, Ser: 1.32 mg/dL — ABNORMAL HIGH (ref 0.44–1.00)
GFR calc Af Amer: 48 mL/min — ABNORMAL LOW (ref >60)
GFR calc non Af Amer: 42 mL/min — ABNORMAL LOW (ref >60)
Glucose, Bld: 81 mg/dL (ref 65–99)
Potassium: 4.6 mmol/L (ref 3.5–5.1)
Sodium: 140 mmol/L (ref 135–145)

## 2016-07-16 NOTE — Addendum Note (Signed)
Encounter addended by: Sherald Hess, NP on: 07/16/2016 12:30 PM<BR>    Actions taken: Follow-up modified

## 2016-07-16 NOTE — Addendum Note (Signed)
Encounter addended by: Marcy Siren, LCSW on: 07/16/2016  2:21 PM<BR>    Actions taken: Sign clinical note

## 2016-07-16 NOTE — Progress Notes (Signed)
Paramedicine Multidisciplinary Team Update  Deborah Ho is enrolled in the [employer] Paramedicine Program through [facility].  The patient presents today in association with an Advanced Heart Failure Clinic Appointment.  Patient living/home environment and social support-- Patient states she lives alone but has the support of 2 brothers who live in [place].  Insurance/ Prescription Coverage-- BC/BS minimal co-pays for prescriptions  Does the patient have a scale and weigh each day?Yes and reports good understanding of when to call and verbalizes understanding of HF symptoms.  Does the patient follow a low salt diet? Yes trying to reduce sodium by eating less fried foods.  Is patient compliant with medications? "I am taking too many" Patient rpeorts she would like to reduce her number of pills taken daily by getting healthy.  Does the patient have transportation for physician appointments? Patient states she drives herself to appointments.  Does the patient contact the HF Clinic appropriately with worsening symptoms or weight increases? Patient verbalizes understanding of symptoms and when to call.  Do you have an Advanced Directive? CSW discussed with patient and she is considering completing an AD.  Are there any identified obstacles / challenges for adherence to current treatment plan?  Patient appears to be motivated to stay healthy. She will track her sodium and eat healthy food options. Patient was adamant that "I'm fighting it". CSW will continue to follow for support and care coordination with paramedicine program. Lasandra Beech, LCSW [phone]

## 2016-07-16 NOTE — Progress Notes (Signed)
Patient ID: Deborah Ho, female   DOB: 07-Aug-1951, 65 y.o.   MRN: [id number]    Advanced Heart Failure Clinic Note   PCP: Juluis Mire Primary HF MD: Dr Aundra Dubin   HPI: Deborah Ho is a 65 y.o. female with a history of HTN, HLD, tobacco abuse, CAD s/panterior STEMI with early stent thrombosis for missing doses of Brilinta s/p PCI x 2 into LAD (04/2014) and chronic systolic HF with EF 67% by 3/16 echo.   Admitted to [facility] in 3/16 with CHF and R pleural effusion. Effusion tapped (transudative). Echo with EF down to 20%. Diuresed with IV lasix and transitioned torsemide + metolazone as needed for weight >187 pounds. Creatinine was 1.7 on the day discharge. Discharge weight 183 pounds.   She was readmitted in 4/16 with CHF in the setting of dietary and ?medical noncompliance.  She was diuresed with IV Lasix and discharged.    Admitted 02/20/16 - 02/25/16 with volume overload. She had not been compliant with her torsemide.  She diuresed 11.5 L and 29 lbs. Discharge 195 lbs.  Admitted 06/26/2016 through 07/06/2016 with marked volume overload. Diuresed with IV lasix and transitioned to torsemide 60 mg daily. Discharge weight 190 pounds. Referred to Paramedicine.   She returns today for post hospital follow up. Has not had medications this morning. Says she has been taking all medications for the entire at one time. Says she has felt dizzy after taking all medications. Did not start entresto. Took an extra potassium yesterday for side pain. Overall feeling ok. Denies SOB/PND/Orthopnea. Weight at home  185 -189 pounds. Walks 1/2 mile daily since discharge. Tries to follow low salt diet. Drinking < 2 liters per day. Lives at home alone.   9/16: Cardiac MRI with EF 19%, LAD territory scar (nonviable), no thrombus.  07/25/2015: ECHO EF 25% RV normal 01/30/2015 ECHO EF 20%  11/13/2014: ECHO EF 35-40% RV normal.  Echo 02/21/16 15%.    Labs 02/05/2015: K 4.7 Creatinine 1.72  Labs 01/28/2015: K 3.4  Creatinine 1.16  Labs 4/16: K 3.3, creatinine 1.72 Labs 6/16: K 2.9, creatinine 0.97, BNP 1536, HCT 42.2 Labs 7/16: K 3.4, creatinine 1.18 Labs 9/16: creatinine 1.5  ROS: All systems negative except as listed in HPI, PMH and Problem List.  SH:  Social History   Social History  . Marital status: Widowed    Spouse name: N/A  . Number of children: N/A  . Years of education: N/A   Occupational History  . not employed    Social History Main Topics  . Smoking status: Current Some Day Smoker    Packs/day: 0.10    Years: 0.50    Types: Cigarettes  . Smokeless tobacco: Current User     Comment: down to 3 cigarettes daily (08/03/14)  . Alcohol use No  . Drug use: No  . Sexual activity: Not on file   Other Topics Concern  . Not on file   Social History Narrative   Patient has 6 brothers and sisters and none have known coronary artery disease. She lives alone in [place], but has several brothers in the area.    FH:  Family History  Problem Relation Age of Onset  . Diabetes Mother   . Hypertension Mother     Past Medical History:  Diagnosis Date  . CAD (coronary artery disease) 05/03/14; 05/09/14   a. anterior STEMI with early in-stent thorombosis for missed dose of Brillinta s/p PCI with DESx 2 into LAD (04/2014)  . GERD (  gastroesophageal reflux disease)    Probable  . HTN (hypertension)   . Hyperlipidemia   . Ischemic cardiomyopathy    a. 04/2014 ECHO with EF 45-50% b.  Repeat 2D echo 08/14/14 with EF down at 15%. Life vest placed  . Non compliance w medication regimen   . Tobacco use     Current Outpatient Prescriptions  Medication Sig Dispense Refill  . atorvastatin (LIPITOR) 80 MG tablet Take 1 tablet (80 mg total) by mouth daily at 6 PM. 30 tablet 0  . carvedilol (COREG) 3.125 MG tablet Take 1 tablet (3.125 mg total) by mouth 2 (two) times daily with a meal. 60 tablet 6  . cetirizine (ZYRTEC) 10 MG tablet Take 10 mg by mouth daily.    . ferrous sulfate 325 (65  FE) MG tablet Take 325 mg by mouth 2 (two) times a week.    . hydrALAZINE (APRESOLINE) 25 MG tablet Take 0.5 tablets (12.5 mg total) by mouth every 8 (eight) hours. 50 tablet 6  . isosorbide mononitrate (IMDUR) 30 MG 24 hr tablet Take 1 tablet (30 mg total) by mouth daily. 30 tablet 6  . potassium chloride SA (K-DUR,KLOR-CON) 20 MEQ tablet Take 1 tablet (20 mEq total) by mouth daily. 34 tablet 6  . spironolactone (ALDACTONE) 25 MG tablet Take 1 tablet (25 mg total) by mouth daily. 30 tablet 6  . ticagrelor (BRILINTA) 60 MG TABS tablet Take 1 tablet (60 mg total) by mouth 2 (two) times daily. 60 tablet 6  . torsemide (DEMADEX) 20 MG tablet Take 3 tablets (60 mg total) by mouth daily. 100 tablet 6  . nitroGLYCERIN (NITROSTAT) 0.4 MG SL tablet Place 0.4 mg under the tongue every 5 (five) minutes as needed for chest pain.    . sacubitril-valsartan (ENTRESTO) 24-26 MG Take 1 tablet by mouth 2 (two) times daily. (Patient not taking: Reported on 07/16/2016) 60 tablet 6   No current facility-administered medications for this encounter.     Vitals:   07/16/16 0959  BP: 134/90  Pulse: (!) 102  SpO2: 98%  Weight: 189 lb 12.8 oz (86.1 kg)    PHYSICAL EXAM: General:  Well appearing. No resp difficulty. Walked in the clinic.   HEENT: normal Neck: supple. JVP not elevated. Carotids 2+ bilaterally; no bruits. No thyromegaly or nodule noted. Cor: PMI normal. RRR, slightly tachy. No M/G/R noted Lungs: CTAB, normal effort.  Abdomen: soft, NT, ND, no HSM. No bruits or masses. +BS  Extremities: no cyanosis, clubbing, rash.  R and LLE trace edema.    Neuro: alert & orientedx3, cranial nerves grossly intact. Moves all 4 extremities w/o difficulty. Affect pleasant.  ASSESSMENT & PLAN: 1.  Chronic systolic CHF:  Ischemic cardiomyopathy, Echo 02/21/16 15%.  NYHA class II. Volume status stable.  Continue torsemide 60 mg daily + 25 mg spiro daily. BMET today.  -Continue 3.125 mg coreg twice daily but she is likely  not taking because we contacted her pharmacy and she has not picked up prescription in the last 6 months.  -  Continue hydralazine 12.5 mg TID and  imdur at 30 mg daily. -Hold off on entresto until we figure out how she tolerates her current blood pressure meds.     Reinforced low salt food choices, limiting fluid intake to < 2 liters and medication compliance.  2. CAD: s/p anterior MI,  PCI x2 LAD 04/2014.  History of stent thrombosis. Continue Brilinta + 80 mg, atorvastatin, bb .  3. HTN- A little elevated.but she  has not had meds today.  4. Medication Noncompliance. - Stress medication compliance.   Today we discovered she was taking all medications for the entire 24 hours once dailly. Also she has not picked up carvedilol in over 6 months.   Follow up with HF Pharmacy next week and HF  APP clinic in 2 weeks. She met with HFSW and Paramedicine today during her appointment. I have asked her to take medications as written on her AVS and take them all at once.   She is at high risk for readmit due to poor insight.   Rome Echavarria NP-C  07/16/2016

## 2016-07-16 NOTE — Progress Notes (Addendum)
Advanced Heart Failure Medication Review by a Pharmacist  Does the patient  feel that his/her medications are working for him/her?  yes  Has the patient been experiencing any side effects to the medications prescribed?  no  Does the patient measure his/her own blood pressure or blood glucose at home?  no   Does the patient have any problems obtaining medications due to transportation or finances?   Yes - Entresto requires PA and was not given 30 day free card  Understanding of regimen: fair Understanding of indications: fair Potential of compliance: fair Patient understands to avoid NSAIDs. Patient understands to avoid decongestants.  Issues to address at subsequent visits: Compliance   Pharmacist comments:  Deborah Ho is a pleasant 65 yo F presenting without a medication list and with Deborah Ho (paramedicine). Patient was recently discharged from hospital and all medications have been reviewed. She reports good compliance with her regimen although she did not start her Entresto 2/2 the PA not being completed and not having the 30 day free card. She has Nurse, learning disability so I will complete her PA and will give her the 30 day free card and the $10 copay card. She also still states that she thinks she is taking 1/2 of her carvedilol BID but she thinks it says 6.25 mg on the bottle. Have called [facility] who stated that they have an Rx for the 3.125 mg tablets but the last time she filled them was in October of 2016. They also faxed me a list of the medications she has filled in the last 3 months. They include atorvastatin, hydralazine and KCl on 8/21 (30 day supplies). Also called [facility] and she did not have a carvedilol Rx on file. She states that [facility] only [facility] she uses. Deborah Ho is going to go out to her home next week to verify her medications. She did not have any other medication-related questions or concerns for me at this time.   [name]. Bonnye Fava, PharmD, BCPS, CPP Clinical  Pharmacist Pager: [phone] Phone: [phone] 07/16/2016 10:24 AM      Time with patient: 12 minutes Preparation and documentation time: 10 minutes Total time: 22 minutes

## 2016-07-22 ENCOUNTER — Telehealth (HOSPITAL_COMMUNITY): Payer: Self-pay | Admitting: Pharmacist

## 2016-07-22 ENCOUNTER — Ambulatory Visit (HOSPITAL_COMMUNITY)
Admission: RE | Admit: 2016-07-22 | Discharge: 2016-07-22 | Disposition: A | Discharge status: Home / Self Care | Payer: BLUE CROSS/BLUE SHIELD | Source: Ambulatory Visit | Attending: Cardiology | Admitting: Cardiology

## 2016-07-22 DIAGNOSIS — I110 Hypertensive heart disease with heart failure: Secondary | ICD-10-CM | POA: Insufficient documentation

## 2016-07-22 DIAGNOSIS — Z87891 Personal history of nicotine dependence: Secondary | ICD-10-CM | POA: Diagnosis not present

## 2016-07-22 DIAGNOSIS — I5022 Chronic systolic (congestive) heart failure: Secondary | ICD-10-CM | POA: Insufficient documentation

## 2016-07-22 DIAGNOSIS — I2510 Atherosclerotic heart disease of native coronary artery without angina pectoris: Secondary | ICD-10-CM | POA: Insufficient documentation

## 2016-07-22 DIAGNOSIS — Z9114 Patient's other noncompliance with medication regimen: Secondary | ICD-10-CM | POA: Insufficient documentation

## 2016-07-22 DIAGNOSIS — I252 Old myocardial infarction: Secondary | ICD-10-CM | POA: Insufficient documentation

## 2016-07-22 DIAGNOSIS — E785 Hyperlipidemia, unspecified: Secondary | ICD-10-CM | POA: Insufficient documentation

## 2016-07-22 DIAGNOSIS — Z955 Presence of coronary angioplasty implant and graft: Secondary | ICD-10-CM | POA: Insufficient documentation

## 2016-07-22 DIAGNOSIS — I509 Heart failure, unspecified: Secondary | ICD-10-CM

## 2016-07-22 MED ORDER — CARVEDILOL 3.125 MG PO TABS: 3.1250 mg | ORAL_TABLET | Freq: Two times a day (BID) | ORAL | 6 refills | Status: DC

## 2016-07-22 MED ORDER — TORSEMIDE 20 MG PO TABS: 60.0000 mg | ORAL_TABLET | Freq: Every day | ORAL | 5 refills | Status: DC

## 2016-07-22 MED ORDER — HYDRALAZINE HCL 25 MG PO TABS: 12.5000 mg | ORAL_TABLET | Freq: Three times a day (TID) | ORAL | 6 refills | Status: DC

## 2016-07-22 MED ORDER — TICAGRELOR 60 MG PO TABS
60.0000 mg | ORAL_TABLET | Freq: Two times a day (BID) | ORAL | 6 refills | Status: DC
Start: 2016-07-22 — End: 2017-04-02

## 2016-07-22 NOTE — Telephone Encounter (Signed)
[name] with paramedicine called to let me know that during her home visit with Ms. Knecht today, she noticed that she did not have a Rx for spironolactone. I have asked that she hold off on that for the time being since she hasn't been taking it and has already been complaining of dizziness.   [name]. Bonnye Fava, PharmD, BCPS, CPP Clinical Pharmacist Pager: [phone] Phone: [phone] 07/22/2016 4:54 PM

## 2016-07-22 NOTE — Patient Instructions (Signed)
Please start taking your CARVEDILOL 3.125 mg TWICE DAILY.  Please start taking BRILINTA 60 MG TWICE DAILY.  Please start taking HYDRALAZINE 12.5 mg (1/2 tablet) THREE TIMES DAILY.  Continue all other medications as noted on your medication list.   Keep your appointment with Tonye Becket, NP-C on 9/14.

## 2016-07-22 NOTE — Progress Notes (Signed)
HPI:  Deborah Ho is a 65 y.o. AA female with a history of HTN, HLD, tobacco abuse, CAD s/panterior STEMI with early stent thrombosis for missing doses of Brilinta s/p PCI x 2 into LAD (04/2014) and chronic systolic HF with EF 15% by 02/21/16 echo.   Admitted 02/20/16 - 02/25/16 with volume overload. She had not been compliant with her torsemide.  She diuresed 11.5 L and 29 lbs. Discharge 195 lbs. Readmitted 06/26/2016 through 07/06/2016 with marked volume overload. Diuresed with IV lasix and transitioned to torsemide 60 mg daily. Discharge weight 190 pounds. Referred to Paramedicine.   She returns today for pharmacist-led HF medication titration. At last HF clinic visit on 8/31, she was very confused about her medications and did not bring them with her. Called her [facility] pharmacy who stated she had only recently picked up KCl, atorvastatin and hydralazine. She did not bring me her medication bottles but states that she verified that she had the medications that were listed on her discharge summary and may be using old bottles. [name] Medical laboratory scientific officer) tried to go out to her home yesterday and she did not allow her in but stated she could come this afternoon. Did not take morning doses today because she takes them at 10 am and only takes the morning doses of her medications. Does not take any of her medications more than once a day. Says she has felt dizzy after taking all medications. Did not start entresto 2/2 not having the 30 day free card. Overall feeling ok. Says she walks about 1/2 mile a day and has been dieting recently in an attempt to lose weight.     . Shortness of breath/dyspnea on exertion? no  . Orthopnea/PND? no . Edema? no . Lightheadedness/dizziness? no . Daily weights at home? Yes stable at ~189 lb  . Blood pressure/heart rate monitoring at home? No - has BP cuff but hasn't been working, Florentina Addison will look at it this afternoon . Following low-sodium/fluid-restricted diet? Yes - stays away  from sodium/fried foods, stays away from canned vegetables; < 2 L fluid daily   HF Medications: Carvedilol 3.125 mg PO daily - only taking once daily Hydralazine 25 mg PO daily - only taking once daily Isosorbide mononitrate 30 mg daily Potassium chloride 20 mEq daily  Spironolactone 25 mg daily Brilinta 45 mg PO daily (samples) - she has been taking 1/2 of a 90 mg tablet daily in an attempt to get close to the prescribed 60 mg but again is only taking once daily Torsemide 60 mg daily   Has the patient been experiencing any side effects to the medications prescribed?  No  Does the patient have any problems obtaining medications due to transportation or finances?   No - will switch to Medicare this year   Understanding of regimen: poor Understanding of indications: poor Potential of compliance: poor Patient understands to avoid NSAIDs. Patient understands to avoid decongestants.    Pertinent Lab Values: . 07/16/16: Serum creatinine 1.32 (BL ~1.2), BUN 32, Potassium 4.6, Sodium 140, Magnesium 2.1  Vital Signs: . Weight: 191 lb (dry weight: 189 lb) . Blood pressure: 112/78 mmHg  . Heart rate: 94 bpm   Assessment: 1. Chronicsystolic CHF (EF 63%), due to ICM. NYHA class IIsymptoms.  - Volume status stable. Did not take her morning doses of medications today.  - She still reports only taking her morning doses of medications but states that she is willing to take her BID and TID medications this way from now  on  - Will have her increase her carvedilol to 3.125 mg BID and hydralazine to 12.5 mg TID  - Continue taking isosorbide mononitrate 30 mg daily, spironolactone 25 mg daily, KCl 20 mEq daily and torsemide 60 mg daily  - Basic disease state pathophysiology, medication indication, mechanism and side effects reviewed at length with patient and he verbalized understanding 2. CAD s/p anterior MI, PCI x 2 LAD 04/2014. History of stent thrombosis  - She was taking 1/2 of a 90 mg Brilinta  once a day in an attempt to get close to the prescribed dose of 60 mg so I have asked her to pick up the 60 mg tablets and take them twice daily (gave her a copay assistance card for these) - Continue atorvastatin 80 mg daily and other medications as above 3. HTN  - BP at goal and states even with SBP in the 110's she feels "different"  - Continue meds as above 4. Medication non-compliance  - Reviewed, at length, the importance of compliance  - Again asked her to bring her medication bottles with her to her next clinic visit and gave her a medication bag, pillbox and pill cutter  - [name] (paramedicine) will visit her this afternoon and verify medications  Plan: 1) Medication changes: Based on clinical presentation, vital signs and recent labs will increase carvedilol to 3.125 mg BID, hydralazine 12.5 mg TID and start Brilinta 60 mg BID 2) Labs: PRN 3) Follow-up: Amy Filbert Schilder, NP-C on 9/14    Zayan Delvecchio K. Bonnye Fava, PharmD, BCPS, CPP Clinical Pharmacist Pager: [phone] Phone: [phone] 07/22/2016 11:04 AM

## 2016-07-30 ENCOUNTER — Ambulatory Visit (HOSPITAL_COMMUNITY)
Admission: RE | Admit: 2016-07-30 | Discharge: 2016-07-30 | Disposition: A | Discharge status: Home / Self Care | Payer: BLUE CROSS/BLUE SHIELD | Source: Ambulatory Visit | Attending: Internal Medicine | Admitting: Internal Medicine

## 2016-07-30 ENCOUNTER — Encounter (HOSPITAL_COMMUNITY): Payer: Self-pay

## 2016-07-30 VITALS — BP 112/86 | HR 96 | Wt 190.8 lb

## 2016-07-30 DIAGNOSIS — Z955 Presence of coronary angioplasty implant and graft: Secondary | ICD-10-CM | POA: Insufficient documentation

## 2016-07-30 DIAGNOSIS — I5022 Chronic systolic (congestive) heart failure: Secondary | ICD-10-CM | POA: Insufficient documentation

## 2016-07-30 DIAGNOSIS — I10 Essential (primary) hypertension: Secondary | ICD-10-CM

## 2016-07-30 DIAGNOSIS — R51 Headache: Secondary | ICD-10-CM | POA: Diagnosis not present

## 2016-07-30 DIAGNOSIS — I110 Hypertensive heart disease with heart failure: Secondary | ICD-10-CM | POA: Diagnosis present

## 2016-07-30 DIAGNOSIS — R42 Dizziness and giddiness: Secondary | ICD-10-CM | POA: Insufficient documentation

## 2016-07-30 DIAGNOSIS — Z9114 Patient's other noncompliance with medication regimen: Secondary | ICD-10-CM | POA: Insufficient documentation

## 2016-07-30 DIAGNOSIS — Z9119 Patient's noncompliance with other medical treatment and regimen: Secondary | ICD-10-CM | POA: Diagnosis not present

## 2016-07-30 DIAGNOSIS — F17210 Nicotine dependence, cigarettes, uncomplicated: Secondary | ICD-10-CM | POA: Diagnosis not present

## 2016-07-30 DIAGNOSIS — I252 Old myocardial infarction: Secondary | ICD-10-CM | POA: Diagnosis not present

## 2016-07-30 DIAGNOSIS — K219 Gastro-esophageal reflux disease without esophagitis: Secondary | ICD-10-CM | POA: Diagnosis not present

## 2016-07-30 DIAGNOSIS — I2510 Atherosclerotic heart disease of native coronary artery without angina pectoris: Secondary | ICD-10-CM | POA: Insufficient documentation

## 2016-07-30 DIAGNOSIS — I255 Ischemic cardiomyopathy: Secondary | ICD-10-CM | POA: Insufficient documentation

## 2016-07-30 DIAGNOSIS — Z79899 Other long term (current) drug therapy: Secondary | ICD-10-CM | POA: Insufficient documentation

## 2016-07-30 HISTORY — DX: Heart failure, unspecified: I50.9

## 2016-07-30 MED ORDER — SACUBITRIL-VALSARTAN 24-26 MG PO TABS: 1.0000 | ORAL_TABLET | Freq: Two times a day (BID) | ORAL | 3 refills | Status: DC

## 2016-07-30 NOTE — Progress Notes (Signed)
Advanced Heart Failure Medication Review by a Pharmacist  Does the patient  feel that his/her medications are working for him/her?  yes  Has the patient been experiencing any side effects to the medications prescribed?  Yes - headache with isosorbide --- have recommended APAP 30 mins prior to dose  Does the patient measure his/her own blood pressure or blood glucose at home?  no   Does the patient have any problems obtaining medications due to transportation or finances?   no  Understanding of regimen: fair Understanding of indications: fair Potential of compliance: fair Patient understands to avoid NSAIDs. Patient understands to avoid decongestants.  Issues to address at subsequent visits: Compliance   Pharmacist comments:  Deborah Ho is a pleasant 65 yo F presenting with Psychiatric nurse (paramedicine). [name] started filling her pillbox for her last week and she states that she has been compliant with her medications since then although she has still been taking 1/2 of a 90 mg Brilinta daily. I again advised her that this is not an appropriate dose for her. I verified with [facility] that Brilinta 60 mg BID went through her insurance for no charge. Have asked her to pick it up ASAP. No other medication-related questions or concerns for me at this time.   [name]. Bonnye Fava, PharmD, BCPS, CPP Clinical Pharmacist Pager: [phone] Phone: [phone] 07/30/2016 12:41 PM    Time with patient: 12 minutes Preparation and documentation time: 6 minutes Total time: 18 minutes

## 2016-07-30 NOTE — Progress Notes (Signed)
CSW met with patient in the clinic with [name] from paramedicine. Patient states she is feeling better and improved since last visit. Patient taking medications throughout the day rather then previously all at once. Patient asked about upcoming benefits with Social Security and Medicare. Patient reports she turns 28 in October and unsure about coverage with medicare. Patient especially concerned about medication coverage. CSW provided information on medicare benefits and encouraged patient to follow up with [facility] for further advice on Medicare options and possible Extra Help program. Patient verbalizes understanding and will follow up. Patient agreeable to continued services through the paramedicine program. CSW will continue to follow and coordinate care with paramedicine program. [name], LCSW [phone]

## 2016-07-30 NOTE — Patient Instructions (Addendum)
START Entresto 24/26 mg tablet: take one tablet twice daily.  STOP Potassium.  Follow up with Amy Clegg NP-C next week.  Do the following things EVERYDAY: 1) Weigh yourself in the morning before breakfast. Write it down and keep it in a log. 2) Take your medicines as prescribed 3) Eat low salt foods-Limit salt (sodium) to 2000 mg per day.  4) Stay as active as you can everyday 5) Limit all fluids for the day to less than 2 liters

## 2016-07-30 NOTE — Progress Notes (Signed)
Patient ID: Deborah Ho, female   DOB: [birth year], 65 y.o.   MRN: [id number]    Advanced Heart Failure Clinic Note   PCP: Deborah Ho Primary HF MD: Dr Deborah Ho   HPI: Deborah Ho is a 65 y.o. female with a history of HTN, HLD, tobacco abuse, CAD s/panterior STEMI with early stent thrombosis for missing doses of Brilinta s/p PCI x 2 into LAD (04/2014) and chronic systolic HF with EF 34% by 3/16 echo.   Admitted to [facility] in 3/16 with CHF and R pleural effusion. Effusion tapped (transudative). Echo with EF down to 20%. Diuresed with IV lasix and transitioned torsemide + metolazone as needed for weight >187 pounds. Creatinine was 1.7 on the day discharge. Discharge weight 183 pounds.   She was readmitted in 4/16 with CHF in the setting of dietary and ?medical noncompliance.  She was diuresed with IV Lasix and discharged.    Admitted 02/20/16 - 02/25/16 with volume overload. She had not been compliant with her torsemide.  She diuresed 11.5 L and 29 lbs. Discharge 195 lbs.  Admitted 06/26/2016 through 07/06/2016 with marked volume overload. Diuresed with IV lasix and transitioned to torsemide 60 mg daily. Discharge weight 190 pounds. Referred to Paramedicine.   She returns today for 2 week FU. Has had all medications this morning. Paramedic has been filling pill box and has been taking medications as prescribed aside from Deborah Ho - only taking 1/2 pill instead. Dizziness improved now that she is taking medications safely. Tries to follow low salt diet. Increased her fluid intake at home but stays under 2 L of fluids a day. Denies SOB/PND/Orthopnea. Weight at home 189 - 191 pounds. Walking once a day consistently without limitation.Lives at home alone. Complains of headache throughout the day - generally present when she wakes up and improves as the day progresses.   9/16: Cardiac MRI with EF 19%, LAD territory scar (nonviable), no thrombus.  07/25/2015: ECHO EF 25% RV normal 01/30/2015 ECHO EF  20%  11/13/2014: ECHO EF 35-40% RV normal.  02/21/16 ECHO EF 15%. Mod reduced RV function, severe TR  Labs 02/05/2015: K 4.7 Creatinine 1.72  Labs 01/28/2015: K 3.4 Creatinine 1.16  Labs 4/16: K 3.3, creatinine 1.72 Labs 6/16: K 2.9, creatinine 0.97, BNP 1536, HCT 42.2 Labs 7/16: K 3.4, creatinine 1.18 Labs 9/16: creatinine 1.5 Labs 07/16/16: K 4.6, Creatinine 1.32  ROS: All systems negative except as listed in HPI, PMH and Problem List.  SH:  Social History   Social History  . Marital status: Widowed    Spouse name: N/A  . Number of children: N/A  . Years of education: N/A   Occupational History  . not employed    Social History Main Topics  . Smoking status: Current Some Day Smoker    Packs/day: 0.10    Years: 0.50    Types: Cigarettes  . Smokeless tobacco: Current User     Comment: down to 3 cigarettes daily (08/03/14)  . Alcohol use No  . Drug use: No  . Sexual activity: Not on file   Other Topics Concern  . Not on file   Social History Narrative   Patient has 6 brothers and sisters and none have known coronary artery disease. She lives alone in [place], but has several brothers in the area.    FH:  Family History  Problem Relation Age of Onset  . Diabetes Mother   . Hypertension Mother     Past Medical History:  Diagnosis Date  .  CAD (coronary artery disease) 05/03/14; 05/09/14   a. anterior STEMI with early in-stent thorombosis for missed dose of Brillinta s/p PCI with DESx 2 into LAD (04/2014)  . CHF (congestive heart failure) ([facility])   . GERD (gastroesophageal reflux disease)    Probable  . HTN (hypertension)   . Hyperlipidemia   . Ischemic cardiomyopathy    a. 04/2014 ECHO with EF 45-50% b.  Repeat 2D echo 08/14/14 with EF down at 15%. Life vest placed  . Non compliance w medication regimen   . Tobacco use     Current Outpatient Prescriptions  Medication Sig Dispense Refill  . atorvastatin (LIPITOR) 80 MG tablet Take 1 tablet (80 mg total) by mouth  daily at 6 PM. 30 tablet 0  . carvedilol (COREG) 3.125 MG tablet Take 1 tablet (3.125 mg total) by mouth 2 (two) times daily. 60 tablet 6  . cetirizine (ZYRTEC) 10 MG tablet Take 10 mg by mouth daily as needed for allergies.     . ferrous sulfate 325 (65 FE) MG tablet Take 325 mg by mouth 2 (two) times a week.    . hydrALAZINE (APRESOLINE) 25 MG tablet Take 0.5 tablets (12.5 mg total) by mouth 3 (three) times daily. 45 tablet 6  . isosorbide mononitrate (IMDUR) 30 MG 24 hr tablet Take 1 tablet (30 mg total) by mouth daily. 30 tablet 6  . torsemide (DEMADEX) 20 MG tablet Take 3 tablets (60 mg total) by mouth daily. 90 tablet 5  . nitroGLYCERIN (NITROSTAT) 0.4 MG SL tablet Place 0.4 mg under the tongue every 5 (five) minutes as needed for chest pain.    . sacubitril-valsartan (ENTRESTO) 24-26 MG Take 1 tablet by mouth 2 (two) times daily. 60 tablet 3  . ticagrelor (BRILINTA) 60 MG TABS tablet Take 1 tablet (60 mg total) by mouth 2 (two) times daily. (Patient not taking: Reported on 07/30/2016) 60 tablet 6   No current facility-administered medications for this encounter.     Vitals:   07/30/16 1214 07/30/16 1256  BP: 120/90 112/86  Pulse: (!) 104 96  SpO2: 96%   Weight: 190 lb 12.8 oz (86.5 kg)     PHYSICAL EXAM: General:  Well appearing. No resp difficulty. Walked in the clinic without difficulty.   HEENT: normal Neck: supple. JVP not elevated. Carotids 2+ bilaterally; no bruits. No thyromegaly or nodule noted. Cor: PMI normal. RRR, slightly tachy. No M/G/R noted Lungs: CTAB, normal effort.  Abdomen: soft, NT, ND, no HSM. No bruits or masses. +BS  Extremities: no cyanosis, clubbing, rash. Trace edema in bilateral LLEs.  Neuro: alert & orientedx3, cranial nerves grossly intact. Moves all 4 extremities w/o difficulty. Affect pleasant.  ASSESSMENT & PLAN: 1.  Chronic systolic CHF:  Ischemic cardiomyopathy, Echo 02/21/16 15%.  NYHA class II. Volume status stable.  Continue torsemide 60 mg  daily + 25 mg spiro daily.  -HR 97- continue Coreg at 3.125 bid today with starting Entresto. -Continue hydralazine 12.5 mg TID and  imdur at 30 mg daily. May need to reduce with headaches. She is going to try taking Tylenol before med to see if it will help.  -Start Entresto 24-26 today. Stop potassium. BMET next week.  Reinforced low salt food choices, limiting fluid intake to < 2 liters and medication compliance.  2. CAD: s/p anterior MI,  PCI x2 LAD 04/2014.  History of stent thrombosis. Continue Brilinta + 80 mg, atorvastatin, bb .  -Correct dose available with pharmacy for her to pick up today.  3. HTN- A little elevated. Has taken all medications today. Start Entresto 24-26.  4. Medication Noncompliance. - improved since Paramedicine has come to help with pill box at home.   Follow up with APP in 1 weeks. She met with HFSW and Paramedicine today during her appointment today.   She is at high risk for readmit due to poor insight.   Granton Callas Student FNP 07/30/2016

## 2016-07-31 ENCOUNTER — Telehealth (HOSPITAL_COMMUNITY): Payer: Self-pay | Admitting: Vascular Surgery

## 2016-07-31 NOTE — Telephone Encounter (Signed)
Left pt message to give 1 week f/u 08/06/16

## 2016-08-06 ENCOUNTER — Encounter (HOSPITAL_COMMUNITY): Payer: Self-pay

## 2016-08-06 ENCOUNTER — Ambulatory Visit (HOSPITAL_COMMUNITY)
Admission: RE | Admit: 2016-08-06 | Discharge: 2016-08-06 | Disposition: A | Discharge status: Home / Self Care | Payer: BLUE CROSS/BLUE SHIELD | Source: Ambulatory Visit | Attending: Internal Medicine | Admitting: Internal Medicine

## 2016-08-06 ENCOUNTER — Encounter: Payer: Self-pay | Admitting: Nurse Practitioner

## 2016-08-06 VITALS — BP 108/70 | HR 93 | Wt 192.4 lb

## 2016-08-06 DIAGNOSIS — Z79899 Other long term (current) drug therapy: Secondary | ICD-10-CM | POA: Insufficient documentation

## 2016-08-06 DIAGNOSIS — I130 Hypertensive heart and chronic kidney disease with heart failure and stage 1 through stage 4 chronic kidney disease, or unspecified chronic kidney disease: Secondary | ICD-10-CM | POA: Diagnosis present

## 2016-08-06 DIAGNOSIS — Z9861 Coronary angioplasty status: Secondary | ICD-10-CM | POA: Insufficient documentation

## 2016-08-06 DIAGNOSIS — Z9119 Patient's noncompliance with other medical treatment and regimen: Secondary | ICD-10-CM | POA: Insufficient documentation

## 2016-08-06 DIAGNOSIS — K219 Gastro-esophageal reflux disease without esophagitis: Secondary | ICD-10-CM | POA: Insufficient documentation

## 2016-08-06 DIAGNOSIS — N183 Chronic kidney disease, stage 3 unspecified: Secondary | ICD-10-CM

## 2016-08-06 DIAGNOSIS — I2510 Atherosclerotic heart disease of native coronary artery without angina pectoris: Secondary | ICD-10-CM | POA: Diagnosis not present

## 2016-08-06 DIAGNOSIS — E785 Hyperlipidemia, unspecified: Secondary | ICD-10-CM | POA: Insufficient documentation

## 2016-08-06 DIAGNOSIS — I5022 Chronic systolic (congestive) heart failure: Secondary | ICD-10-CM | POA: Diagnosis not present

## 2016-08-06 DIAGNOSIS — I5021 Acute systolic (congestive) heart failure: Secondary | ICD-10-CM | POA: Diagnosis not present

## 2016-08-06 DIAGNOSIS — I255 Ischemic cardiomyopathy: Secondary | ICD-10-CM | POA: Diagnosis not present

## 2016-08-06 DIAGNOSIS — Z8249 Family history of ischemic heart disease and other diseases of the circulatory system: Secondary | ICD-10-CM | POA: Insufficient documentation

## 2016-08-06 DIAGNOSIS — Z9114 Patient's other noncompliance with medication regimen: Secondary | ICD-10-CM | POA: Insufficient documentation

## 2016-08-06 DIAGNOSIS — F17210 Nicotine dependence, cigarettes, uncomplicated: Secondary | ICD-10-CM | POA: Insufficient documentation

## 2016-08-06 DIAGNOSIS — I252 Old myocardial infarction: Secondary | ICD-10-CM | POA: Diagnosis not present

## 2016-08-06 DIAGNOSIS — I10 Essential (primary) hypertension: Secondary | ICD-10-CM | POA: Diagnosis not present

## 2016-08-06 LAB — BASIC METABOLIC PANEL
Anion gap: 13 (ref 5–15)
BUN: 21 mg/dL — ABNORMAL HIGH (ref 6–20)
CO2: 25 mmol/L (ref 22–32)
Calcium: 9.6 mg/dL (ref 8.9–10.3)
Chloride: 102 mmol/L (ref 101–111)
Creatinine, Ser: 1.56 mg/dL — ABNORMAL HIGH (ref 0.44–1.00)
GFR calc Af Amer: 39 mL/min — ABNORMAL LOW (ref >60)
GFR calc non Af Amer: 34 mL/min — ABNORMAL LOW (ref >60)
Glucose, Bld: 85 mg/dL (ref 65–99)
Potassium: 3.8 mmol/L (ref 3.5–5.1)
Sodium: 140 mmol/L (ref 135–145)

## 2016-08-06 LAB — BRAIN NATRIURETIC PEPTIDE: B Natriuretic Peptide: 263.7 pg/mL — ABNORMAL HIGH (ref 0.0–100.0)

## 2016-08-06 MED ORDER — CARVEDILOL 3.125 MG PO TABS: 6.2500 mg | ORAL_TABLET | Freq: Two times a day (BID) | ORAL | 6 refills | Status: DC

## 2016-08-06 NOTE — Progress Notes (Signed)
Patient ID: Deborah Ho, female   DOB: 11-25-1950, 65 y.o.   MRN: [id number]    Advanced Heart Failure Clinic Note   PCP: [name] Primary HF MD: Dr Shirlee Latch   HPI: Deborah Ho is a 65 y.o. female with a history of HTN, HLD, tobacco abuse, CAD s/panterior STEMI with early stent thrombosis for missing doses of Brilinta s/p PCI x 2 into LAD (04/2014) and chronic systolic HF with EF 20% by 3/16 echo.   Admitted to [facility] in 3/16 with CHF and R pleural effusion. Effusion tapped (transudative). Echo with EF down to 20%. Diuresed with IV lasix and transitioned torsemide + metolazone as needed for weight >187 pounds. Creatinine was 1.7 on the day discharge. Discharge weight 183 pounds.   She was readmitted in 4/16 with CHF in the setting of dietary and ?medical noncompliance.  She was diuresed with IV Lasix and discharged.    Admitted 02/20/16 - 02/25/16 with volume overload. She had not been compliant with her torsemide.  She diuresed 11.5 L and 29 lbs. Discharge 195 lbs.  Admitted 06/26/2016 through 07/06/2016 with marked volume overload. Diuresed with IV lasix and transitioned to torsemide 60 mg daily. Discharge weight 190 pounds. Referred to Paramedicine.   She returns today for regular follow up. At last visit started on Entresto. She states she hasn't had any more HAs. She states paramedic has not been able to get in touch with her due to her cell phone battery dying Breathing has been good. Watching her salt and fluid intake.  She denies DOE/PND/Orthopnea. No peripheral edema.  Denies lightheadedness or dizziness.   9/16: Cardiac MRI with EF 19%, LAD territory scar (nonviable), no thrombus.  07/25/2015: ECHO EF 25% RV normal 01/30/2015 ECHO EF 20%  11/13/2014: ECHO EF 35-40% RV normal.  02/21/16 ECHO EF 15%. Mod reduced RV function, severe TR  Labs 02/05/2015: K 4.7 Creatinine 1.72  Labs 01/28/2015: K 3.4 Creatinine 1.16  Labs 4/16: K 3.3, creatinine 1.72 Labs 6/16: K 2.9, creatinine  0.97, BNP 1536, HCT 42.2 Labs 7/16: K 3.4, creatinine 1.18 Labs 9/16: creatinine 1.5 Labs 07/16/16: K 4.6, Creatinine 1.32  ROS: All systems negative except as listed in HPI, PMH and Problem List.  SH:  Social History   Social History  . Marital status: Widowed    Spouse name: N/A  . Number of children: N/A  . Years of education: N/A   Occupational History  . not employed    Social History Main Topics  . Smoking status: Current Some Day Smoker    Packs/day: 0.10    Years: 0.50    Types: Cigarettes  . Smokeless tobacco: Current User     Comment: down to 3 cigarettes daily (08/03/14)  . Alcohol use No  . Drug use: No  . Sexual activity: Not on file   Other Topics Concern  . Not on file   Social History Narrative   Patient has 6 brothers and sisters and none have known coronary artery disease. She lives alone in [place], but has several brothers in the area.    FH:  Family History  Problem Relation Age of Onset  . Diabetes Mother   . Hypertension Mother     Past Medical History:  Diagnosis Date  . CAD (coronary artery disease) 05/03/14; 05/09/14   a. anterior STEMI with early in-stent thorombosis for missed dose of Brillinta s/p PCI with DESx 2 into LAD (04/2014)  . CHF (congestive heart failure) ([facility])   . GERD (  gastroesophageal reflux disease)    Probable  . HTN (hypertension)   . Hyperlipidemia   . Ischemic cardiomyopathy    a. 04/2014 ECHO with EF 45-50% b.  Repeat 2D echo 08/14/14 with EF down at 15%. Life vest placed  . Non compliance w medication regimen   . Tobacco use     Current Outpatient Prescriptions  Medication Sig Dispense Refill  . atorvastatin (LIPITOR) 80 MG tablet Take 1 tablet (80 mg total) by mouth daily at 6 PM. 30 tablet 0  . carvedilol (COREG) 3.125 MG tablet Take 1 tablet (3.125 mg total) by mouth 2 (two) times daily. 60 tablet 6  . cetirizine (ZYRTEC) 10 MG tablet Take 10 mg by mouth daily as needed for allergies.     . ferrous sulfate  325 (65 FE) MG tablet Take 325 mg by mouth 2 (two) times a week.    . hydrALAZINE (APRESOLINE) 25 MG tablet Take 0.5 tablets (12.5 mg total) by mouth 3 (three) times daily. 45 tablet 6  . isosorbide mononitrate (IMDUR) 30 MG 24 hr tablet Take 1 tablet (30 mg total) by mouth daily. 30 tablet 6  . sacubitril-valsartan (ENTRESTO) 24-26 MG Take 1 tablet by mouth 2 (two) times daily. 60 tablet 3  . ticagrelor (BRILINTA) 60 MG TABS tablet Take 1 tablet (60 mg total) by mouth 2 (two) times daily. 60 tablet 6  . torsemide (DEMADEX) 20 MG tablet Take 3 tablets (60 mg total) by mouth daily. 90 tablet 5  . nitroGLYCERIN (NITROSTAT) 0.4 MG SL tablet Place 0.4 mg under the tongue every 5 (five) minutes as needed for chest pain.     No current facility-administered medications for this encounter.     Vitals:   08/06/16 1501  BP: 108/70  Pulse: 93  SpO2: 97%  Weight: 192 lb 6.4 oz (87.3 kg)   Wt Readings from Last 3 Encounters:  08/06/16 192 lb 6.4 oz (87.3 kg)  07/30/16 190 lb 12.8 oz (86.5 kg)  07/22/16 191 lb 12.8 oz (87 kg)    PHYSICAL EXAM: General:  Well appearing.  Ambulated into clinic without difficulty. HEENT: normal Neck: supple. JVP does not appear elevated. Carotids 2+ bilaterally; no bruits. No thyromegaly or nodule noted. Cor: PMI normal. RRR, slightly tachy. No M/G/R noted Lungs: CTAB, normal effort.  Abdomen: soft, NT, ND, no HSM. No bruits or masses. +BS  Extremities: no cyanosis, clubbing, rash. Trace ankle edema at most.  Neuro: alert & oriented x 3, cranial nerves grossly intact. Moves all 4 extremities w/o difficulty. Affect pleasant.  ASSESSMENT & PLAN: 1.  Chronic systolic CHF:  Ischemic cardiomyopathy, Echo 02/21/16 15%.  NYHA class II. Volume status stable.   - Continue torsemide 60 mg daily + 25 mg spiro daily.  - Increase Coreg to 6.25 mg BID.  -Continue hydralazine 12.5 mg TID and imdur at 30 mg daily. HAs resolved with tylenol.  -Continue Entresto 24-26 mg BID.  Check BMET today.   Reinforced low salt food choices, limiting fluid intake to < 2 liters and medication compliance.  2. CAD: s/p anterior MI,  PCI x2 LAD 04/2014.  History of stent thrombosis. Continue Brilinta + 80 mg, atorvastatin, bb .  -Correct dose available with pharmacy for her to pick up today.  3. HTN-  - Stable on current medications.  Will treat in the setting of adjusting her HF medications. - Increasing coreg as above.   4. CKD stage III - BMET today.  5. Medication Noncompliance. - improved  since Paramedicine has come to help with pill box at home.   BMET/BNP today. Increase coreg as above. Follow up weeks. We discussed plans moving ahead, such as repeat Echo in 3-6 months with consideration of ICD if EF not improved with medication optimization.   She remains at high risk for readmit due to poor insight, but her compliance seems to be improving via utilization of paramedicine.   Graciella Freer, PA-C 08/06/2016   Total time spent > 25 minutes. Over half that spent discussing the above.

## 2016-08-06 NOTE — Patient Instructions (Signed)
Labs today We will only contact you if something comes back abnormal or we need to make some changes. Otherwise no news is good news!  INCREASE Coreg to 6.25 mg ( 2 tabs) twice a day  Your physician recommends that you schedule a follow-up appointment in: 2 weeks with Otilio Saber, PA   Do the following things EVERYDAY: 1) Weigh yourself in the morning before breakfast. Write it down and keep it in a log. 2) Take your medicines as prescribed 3) Eat low salt foods-Limit salt (sodium) to 2000 mg per day.  4) Stay as active as you can everyday 5) Limit all fluids for the day to less than 2 liters .

## 2016-08-06 NOTE — Addendum Note (Signed)
Encounter addended by: Graciella Freer, PA-C on: 08/06/2016  3:53 PM<BR>    Actions taken: LOS modified, Follow-up modified

## 2016-08-06 NOTE — Progress Notes (Signed)
Advanced Heart Failure Medication Review by a Pharmacist  Does the patient  feel that his/her medications are working for him/her?  yes  Has the patient been experiencing any side effects to the medications prescribed?  no  Does the patient measure his/her own blood pressure or blood glucose at home?  yes   Does the patient have any problems obtaining medications due to transportation or finances?   no  Understanding of regimen: poor Understanding of indications: poor Potential of compliance: fair Patient understands to avoid NSAIDs. Patient understands to avoid decongestants.  Issues to address at subsequent visits: Compliance/understanding   Pharmacist comments:  Ms. Deborah Ho is a pleasant 65 yo F presenting without a medication list and with poor understanding of her regimen although she seems to have a slightly better understanding today. She reports good compliance with her regimen and did not have any specific medication-related questions or concerns for me at this time.   [name]. Bonnye Fava, PharmD, BCPS, CPP Clinical Pharmacist Pager: [phone] Phone: [phone] 08/06/2016 3:32 PM      Time with patient: 10 minutes Preparation and documentation time: 2 minutes Total time: 12 minutes

## 2016-08-13 ENCOUNTER — Telehealth (HOSPITAL_COMMUNITY): Payer: Self-pay | Admitting: Pharmacist

## 2016-08-13 NOTE — Telephone Encounter (Signed)
Entresto 24-26 mg PA approved by [employer] commercial on 07/30/16.   [name]. Bonnye Fava, PharmD, BCPS, CPP Clinical Pharmacist Pager: [phone] Phone: [phone] 08/13/2016 11:49 AM

## 2016-08-20 ENCOUNTER — Telehealth (HOSPITAL_COMMUNITY): Payer: Self-pay | Admitting: Vascular Surgery

## 2016-08-20 ENCOUNTER — Inpatient Hospital Stay (HOSPITAL_COMMUNITY): Admission: RE | Admit: 2016-08-20 | Payer: Self-pay | Source: Ambulatory Visit

## 2016-08-20 NOTE — Telephone Encounter (Signed)
Returned pt called to reschedule appt 

## 2016-09-08 ENCOUNTER — Other Ambulatory Visit (HOSPITAL_COMMUNITY): Payer: Self-pay | Admitting: Cardiology

## 2016-09-08 DIAGNOSIS — I509 Heart failure, unspecified: Secondary | ICD-10-CM

## 2016-09-23 IMAGING — MR MR CARD MORPHOLOGY WO/W CM
13 of 14 series · 15 of 16 positions shown · IV contrast (25    Multihance)
Comparison: none

CLINICAL DATA: Cardiomyopathy,?LV thrombus on echo.

EXAM:
CARDIAC MRI
TECHNIQUE: The patient was scanned on a 1.5 Tesla GE magnet. A dedicated
cardiac coil was used. Functional imaging was done using Fiesta
sequences. [DATE], and 4 chamber views were done to assess for RWMA's.
Modified Zabadjama rule using a short axis stack was used to
calculate an ejection fraction on a dedicated work station using
Circle software. The patient received 29 cc of Multihance. After 10
minutes inversion recovery sequences were used to assess for
infiltration and scar tissue.
CONTRAST:  29 cc Multihance

[Series 3: bSSFP · sagittal · 8.0mm · 1.37mm/px · 1 of 16 slices shown (1 of 7)]
[im 1/16]
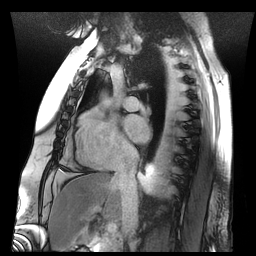

[Series 7: bSSFP · axial · 8.0mm · 1.37mm/px · 1 of 20 slices shown (2 of 7)]
[im 1/20]
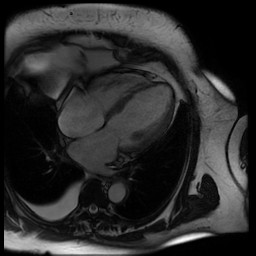

[Series 8: bSSFP · oblique · 8.0mm · 1.45mm/px · 2 of 400 slices shown (3 of 7)]
[im 1/400]
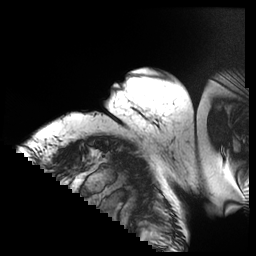
[im 400/400]
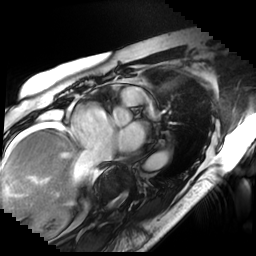

[Series 9: bSSFP · oblique · 8.0mm · 1.45mm/px · 2 of 400 slices shown (4 of 7)]
[im 1/400]
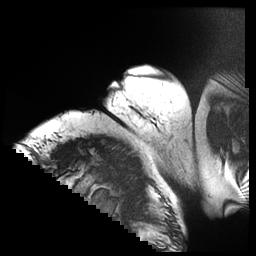
[im 400/400]
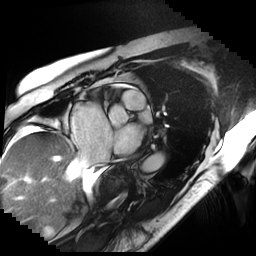

[Series 11: bSSFP · axial · 6.0mm · 1.37mm/px · 1 of 300 slices shown (5 of 7)]
[im 1/300]
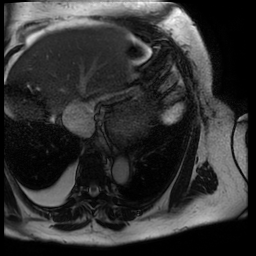

[Series 12: bSSFP · axial · 8.0mm · 1.21mm/px · 1 of 60 slices shown (6 of 7)]
[im 1/60]
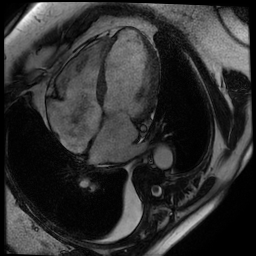

[Series 13: bSSFP · oblique · 8.0mm · 1.33mm/px · 1 of 20 slices shown (7 of 7)]
[im 1/20]
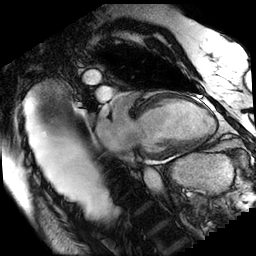

[Series 14: cine ir · oblique · 8.0mm · 1.48mm/px · 1 of 30 slices shown]
[im 1/30]
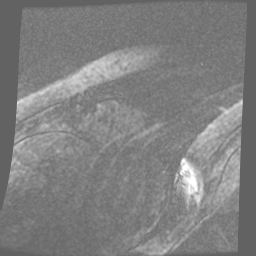

[Series 18: delayed ir prep · oblique · 8.0mm · 1.56mm/px · 1 of 17 slices shown (1 of 2)]
[im 1/17]
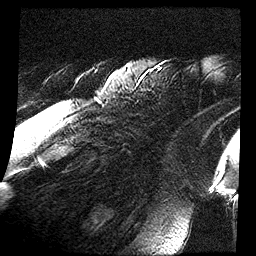

[Series 19: delayed ir prep · oblique · 8.0mm · 1.56mm/px · 1 of 12 slices shown (2 of 2)]
[im 1/12]
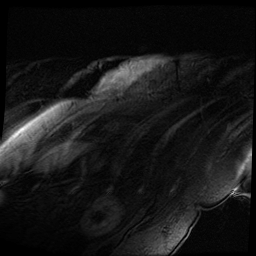

[Series 20: rad delayed ir · oblique · 8.0mm · 1.33mm/px · 1 of 4 slices shown (1 of 3)]
[im 1/4]
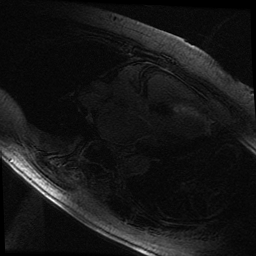

[Series 21: rad delayed ir · oblique · 8.0mm · 1.45mm/px · 1 of 3 slices shown (2 of 3)]
[im 1/3]
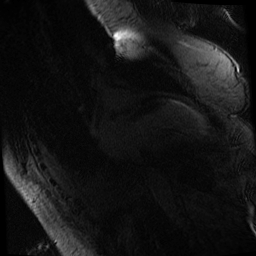

[Series 23: rad delayed ir · axial · 8.0mm · 1.45mm/px · 1 of 3 slices shown (3 of 3)]
[im 1/3]
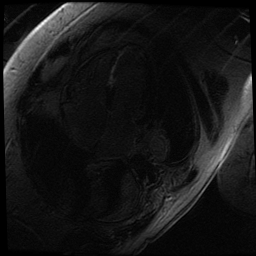

[15 of 16 positions shown; findings below may reference images not displayed]

FINDINGS: There is a small to moderate right pleural effusion. Limited images
of lung fields showed no other abnormalities.

The left ventricle was mildly dilated. There was septal dyskinesis,
anterior akinesis, and apical akinesis. There was no LV thrombus
noted. EF 19%. Mildly dilated RV with mildly decreased systolic
function. Moderate biatrial enlargement. Moderate TR, moderate MR.
Trileaflet aortic valve without significant regurgitation or
stenosis.

On delayed enhancement imaging, there was full thickness late
gadolinium enhancement (LGE) in the mid to apical anteroseptal and
inferoseptal walls, at the apex, and in the apical lateral wall. The
mid-apical anterior wall had 76-99% wall thickness subendocardial
LGE.

MEASUREMENTS:
MEASUREMENTS
LV EDV 215 mL

LV SV 41 mL

LV EF 19%
IMPRESSION: 1. Mildly dilated LV with wall motion abnormalities as noted above.
EF 19%.

2.  Mildly dilated RV with mildly decreased systolic function.

3.  No LV thrombus.

4. LGE shows scar throughout the mid to apical septal wall, the
apical lateral wall, the apex, and the anterior wall. These walls
appear nonviable and are unlikely to improve with revascularization.

She Gz

## 2016-10-13 ENCOUNTER — Telehealth (HOSPITAL_COMMUNITY): Payer: Self-pay | Admitting: Surgery

## 2016-10-13 NOTE — Telephone Encounter (Signed)
Deborah Ho will be discharged from the [facility] secondary to the inability to reach her and her lack of interest/involvement in the program.

## 2017-01-28 ENCOUNTER — Emergency Department (HOSPITAL_COMMUNITY): Admit: 2017-01-28 | Discharge: 2017-01-28 | Disposition: A | Discharge status: Home / Self Care | Payer: Medicare Other

## 2017-01-28 ENCOUNTER — Inpatient Hospital Stay (HOSPITAL_COMMUNITY)
Admission: EM | Admit: 2017-01-28 | Discharge: 2017-02-06 | DRG: 291 | Disposition: A | Discharge status: Home Health | Payer: Medicare Other | Attending: Internal Medicine | Admitting: Internal Medicine

## 2017-01-28 ENCOUNTER — Encounter (HOSPITAL_COMMUNITY): Payer: Self-pay

## 2017-01-28 ENCOUNTER — Emergency Department (HOSPITAL_COMMUNITY): Payer: Medicare Other

## 2017-01-28 DIAGNOSIS — I5023 Acute on chronic systolic (congestive) heart failure: Secondary | ICD-10-CM

## 2017-01-28 DIAGNOSIS — N289 Disorder of kidney and ureter, unspecified: Secondary | ICD-10-CM

## 2017-01-28 DIAGNOSIS — Z7902 Long term (current) use of antithrombotics/antiplatelets: Secondary | ICD-10-CM

## 2017-01-28 DIAGNOSIS — I10 Essential (primary) hypertension: Secondary | ICD-10-CM | POA: Diagnosis not present

## 2017-01-28 DIAGNOSIS — M7989 Other specified soft tissue disorders: Secondary | ICD-10-CM

## 2017-01-28 DIAGNOSIS — I130 Hypertensive heart and chronic kidney disease with heart failure and stage 1 through stage 4 chronic kidney disease, or unspecified chronic kidney disease: Secondary | ICD-10-CM | POA: Diagnosis present

## 2017-01-28 DIAGNOSIS — N183 Chronic kidney disease, stage 3 unspecified: Secondary | ICD-10-CM | POA: Diagnosis present

## 2017-01-28 DIAGNOSIS — Z79899 Other long term (current) drug therapy: Secondary | ICD-10-CM

## 2017-01-28 DIAGNOSIS — R7989 Other specified abnormal findings of blood chemistry: Secondary | ICD-10-CM | POA: Diagnosis present

## 2017-01-28 DIAGNOSIS — Z6837 Body mass index (BMI) 37.0-37.9, adult: Secondary | ICD-10-CM | POA: Diagnosis not present

## 2017-01-28 DIAGNOSIS — E876 Hypokalemia: Secondary | ICD-10-CM | POA: Diagnosis not present

## 2017-01-28 DIAGNOSIS — M79609 Pain in unspecified limb: Secondary | ICD-10-CM | POA: Diagnosis not present

## 2017-01-28 DIAGNOSIS — R945 Abnormal results of liver function studies: Secondary | ICD-10-CM

## 2017-01-28 DIAGNOSIS — E669 Obesity, unspecified: Secondary | ICD-10-CM | POA: Diagnosis present

## 2017-01-28 DIAGNOSIS — R748 Abnormal levels of other serum enzymes: Secondary | ICD-10-CM | POA: Diagnosis not present

## 2017-01-28 DIAGNOSIS — Z955 Presence of coronary angioplasty implant and graft: Secondary | ICD-10-CM | POA: Diagnosis not present

## 2017-01-28 DIAGNOSIS — E785 Hyperlipidemia, unspecified: Secondary | ICD-10-CM | POA: Diagnosis present

## 2017-01-28 DIAGNOSIS — I255 Ischemic cardiomyopathy: Secondary | ICD-10-CM | POA: Diagnosis present

## 2017-01-28 DIAGNOSIS — I5043 Acute on chronic combined systolic (congestive) and diastolic (congestive) heart failure: Secondary | ICD-10-CM | POA: Diagnosis present

## 2017-01-28 DIAGNOSIS — I252 Old myocardial infarction: Secondary | ICD-10-CM | POA: Diagnosis not present

## 2017-01-28 DIAGNOSIS — I2510 Atherosclerotic heart disease of native coronary artery without angina pectoris: Secondary | ICD-10-CM | POA: Diagnosis present

## 2017-01-28 DIAGNOSIS — R17 Unspecified jaundice: Secondary | ICD-10-CM | POA: Diagnosis not present

## 2017-01-28 DIAGNOSIS — L03116 Cellulitis of left lower limb: Secondary | ICD-10-CM

## 2017-01-28 DIAGNOSIS — Z9119 Patient's noncompliance with other medical treatment and regimen: Secondary | ICD-10-CM

## 2017-01-28 DIAGNOSIS — I517 Cardiomegaly: Secondary | ICD-10-CM | POA: Diagnosis not present

## 2017-01-28 DIAGNOSIS — M79605 Pain in left leg: Secondary | ICD-10-CM | POA: Diagnosis present

## 2017-01-28 LAB — CBC WITH DIFFERENTIAL/PLATELET
Basophils Absolute: 0 10*3/uL (ref 0.0–0.1)
Basophils Relative: 0 %
Eosinophils Absolute: 0.1 10*3/uL (ref 0.0–0.7)
Eosinophils Relative: 1 %
HCT: 48.4 % — ABNORMAL HIGH (ref 36.0–46.0)
Hemoglobin: 15.2 g/dL — ABNORMAL HIGH (ref 12.0–15.0)
Lymphocytes Relative: 16 %
Lymphs Abs: 1.6 10*3/uL (ref 0.7–4.0)
MCH: 28.5 pg (ref 26.0–34.0)
MCHC: 31.4 g/dL (ref 30.0–36.0)
MCV: 90.6 fL (ref 78.0–100.0)
Monocytes Absolute: 0.7 10*3/uL (ref 0.1–1.0)
Monocytes Relative: 7 %
Neutro Abs: 7.4 10*3/uL (ref 1.7–7.7)
Neutrophils Relative %: 76 %
Platelets: 292 10*3/uL (ref 150–400)
RBC: 5.34 MIL/uL — ABNORMAL HIGH (ref 3.87–5.11)
RDW: 15.2 % (ref 11.5–15.5)
WBC: 9.8 10*3/uL (ref 4.0–10.5)

## 2017-01-28 LAB — URINALYSIS, ROUTINE W REFLEX MICROSCOPIC
Bilirubin Urine: NEGATIVE
Glucose, UA: NEGATIVE mg/dL
Ketones, ur: NEGATIVE mg/dL
Leukocytes, UA: NEGATIVE
Nitrite: NEGATIVE
Protein, ur: 30 mg/dL — AB
Specific Gravity, Urine: 1.011 (ref 1.005–1.030)
pH: 5 (ref 5.0–8.0)

## 2017-01-28 LAB — COMPREHENSIVE METABOLIC PANEL
ALT: 20 U/L (ref 14–54)
AST: 29 U/L (ref 15–41)
Albumin: 3.4 g/dL — ABNORMAL LOW (ref 3.5–5.0)
Alkaline Phosphatase: 220 U/L — ABNORMAL HIGH (ref 38–126)
Anion gap: 10 (ref 5–15)
BUN: 18 mg/dL (ref 6–20)
CO2: 25 mmol/L (ref 22–32)
Calcium: 9.1 mg/dL (ref 8.9–10.3)
Chloride: 106 mmol/L (ref 101–111)
Creatinine, Ser: 1.19 mg/dL — ABNORMAL HIGH (ref 0.44–1.00)
GFR calc Af Amer: 54 mL/min — ABNORMAL LOW (ref >60)
GFR calc non Af Amer: 47 mL/min — ABNORMAL LOW (ref >60)
Glucose, Bld: 105 mg/dL — ABNORMAL HIGH (ref 65–99)
Potassium: 3.2 mmol/L — ABNORMAL LOW (ref 3.5–5.1)
Sodium: 141 mmol/L (ref 135–145)
Total Bilirubin: 2.6 mg/dL — ABNORMAL HIGH (ref 0.3–1.2)
Total Protein: 6.6 g/dL (ref 6.5–8.1)

## 2017-01-28 LAB — TROPONIN I: Troponin I: <0.03 ng/mL (ref <0.03)

## 2017-01-28 LAB — BRAIN NATRIURETIC PEPTIDE: B Natriuretic Peptide: 1920 pg/mL — ABNORMAL HIGH (ref 0.0–100.0)

## 2017-01-28 LAB — MAGNESIUM: Magnesium: 2 mg/dL (ref 1.7–2.4)

## 2017-01-28 LAB — SEDIMENTATION RATE: Sed Rate: 4 mm/hr (ref 0–22)

## 2017-01-28 MED ORDER — MORPHINE SULFATE (PF) 4 MG/ML IV SOLN: 4.0000 mg | Freq: Once | INTRAVENOUS | Status: DC | PRN

## 2017-01-28 MED ORDER — FUROSEMIDE 10 MG/ML IJ SOLN
80.0000 mg | Freq: Two times a day (BID) | INTRAMUSCULAR | Status: DC
  Administered 2017-01-29: 80 mg via INTRAVENOUS
  Filled 2017-01-28: qty 8

## 2017-01-28 MED ORDER — ACETAMINOPHEN 325 MG PO TABS
650.0000 mg | ORAL_TABLET | Freq: Four times a day (QID) | ORAL | Status: DC | PRN
  Filled 2017-01-28: qty 2

## 2017-01-28 MED ORDER — POTASSIUM CHLORIDE CRYS ER 20 MEQ PO TBCR
40.0000 meq | EXTENDED_RELEASE_TABLET | Freq: Two times a day (BID) | ORAL | Status: DC
  Administered 2017-01-28 – 2017-02-05 (×16): 40 meq via ORAL
  Filled 2017-01-28 (×16): qty 2

## 2017-01-28 MED ORDER — CEFAZOLIN IN D5W 1 GM/50ML IV SOLN
1.0000 g | Freq: Once | INTRAVENOUS | Status: AC
  Administered 2017-01-28: 1 g via INTRAVENOUS
  Filled 2017-01-28: qty 50

## 2017-01-28 MED ORDER — TICAGRELOR 60 MG PO TABS
60.0000 mg | ORAL_TABLET | Freq: Two times a day (BID) | ORAL | Status: DC
  Administered 2017-01-28 – 2017-02-06 (×18): 60 mg via ORAL
  Filled 2017-01-28 (×18): qty 1

## 2017-01-28 MED ORDER — ATORVASTATIN CALCIUM 80 MG PO TABS
80.0000 mg | ORAL_TABLET | Freq: Every day | ORAL | Status: DC
  Administered 2017-01-29 – 2017-02-05 (×8): 80 mg via ORAL
  Filled 2017-01-28 (×8): qty 1

## 2017-01-28 MED ORDER — ACETAMINOPHEN 650 MG RE SUPP: 650.0000 mg | Freq: Four times a day (QID) | RECTAL | Status: DC | PRN

## 2017-01-28 MED ORDER — CARVEDILOL 3.125 MG PO TABS
3.1250 mg | ORAL_TABLET | Freq: Two times a day (BID) | ORAL | Status: DC
  Administered 2017-01-29 – 2017-02-02 (×10): 3.125 mg via ORAL
  Filled 2017-01-28 (×10): qty 1

## 2017-01-28 MED ORDER — FUROSEMIDE 10 MG/ML IJ SOLN
80.0000 mg | Freq: Once | INTRAMUSCULAR | Status: AC
Start: 2017-01-28 — End: 2017-01-28
  Administered 2017-01-28: 80 mg via INTRAVENOUS
  Filled 2017-01-28: qty 8

## 2017-01-28 MED ORDER — LISINOPRIL 5 MG PO TABS
5.0000 mg | ORAL_TABLET | Freq: Every day | ORAL | Status: DC
  Administered 2017-01-29 – 2017-01-30 (×2): 5 mg via ORAL
  Filled 2017-01-28 (×2): qty 1

## 2017-01-28 MED ORDER — ENOXAPARIN SODIUM 40 MG/0.4ML ~~LOC~~ SOLN
40.0000 mg | SUBCUTANEOUS | Status: DC
  Administered 2017-01-29 – 2017-02-05 (×8): 40 mg via SUBCUTANEOUS
  Filled 2017-01-28 (×9): qty 0.4

## 2017-01-28 MED ORDER — FERROUS SULFATE 325 (65 FE) MG PO TABS
325.0000 mg | ORAL_TABLET | ORAL | Status: DC
  Administered 2017-02-02 – 2017-02-04 (×2): 325 mg via ORAL
  Filled 2017-01-28 (×2): qty 1

## 2017-01-28 MED ORDER — TRAMADOL HCL 50 MG PO TABS
50.0000 mg | ORAL_TABLET | Freq: Four times a day (QID) | ORAL | Status: DC | PRN
  Administered 2017-01-29 – 2017-02-06 (×16): 50 mg via ORAL
  Filled 2017-01-28 (×17): qty 1

## 2017-01-28 MED ORDER — MORPHINE SULFATE (PF) 4 MG/ML IV SOLN
4.0000 mg | Freq: Once | INTRAVENOUS | Status: AC
  Administered 2017-01-28: 4 mg via INTRAVENOUS
  Filled 2017-01-28: qty 1

## 2017-01-28 MED ORDER — POTASSIUM CHLORIDE CRYS ER 20 MEQ PO TBCR
40.0000 meq | EXTENDED_RELEASE_TABLET | Freq: Once | ORAL | Status: AC
  Administered 2017-01-28: 40 meq via ORAL
  Filled 2017-01-28: qty 2

## 2017-01-28 NOTE — ED Notes (Signed)
Delay in lab draw,  Pt not in room 

## 2017-01-28 NOTE — H&P (Signed)
History and Physical  Patient Name: Deborah Ho     TRV:[id number]    DOB: Jul 18, 1951    DOA: 01/28/2017 PCP: Kerin Perna, NP   Patient coming from: Home  Chief Complaint: Leg swelling, pain  HPI: Deborah Ho is a 66 y.o. female with a past medical history significant for CHF EF 15%, medical noncompliance, HTN, and CAD s/p PCI/STEMI who presents with gradual leg swelling and pain and weeping.  The patient was in her usual state of health until about a week ago when she developed leg swelling, worse on the left, left leg pain and copious serous weeping.  She was having to use a towel to keep the leg dry it was weeping so much.  Over this week, the weeping continued, and she had intermittent brief stabbing severe pains deep in the elft lower leg, so she came to the ER today.    She had had a 2-3 week mild GI illness preceding this (brief fever about 2 weeks ago, some vomiting and loose stools), all of which has resolved.  She has had a cough this week, but no dyspnea on exertion, orthopnea, PND recently.  She can walk "2 miles" without dyspnea she thinks.  States dry weight is 180 lbs.  She claims she takes her furosemide, Brilinta regularly.  Her last CHF clinic note was in Sept 2017 and they list her on torsemide, spironolactone, Entresto, hydralazine, Imdur, and a beta-blocker.  She claims she got dizzy with Delene Loll and her PCP stopped it, and she is on none of the other medicines.    ED course: -Afebrile, heart rate 91, respirations 16, BP 153/95, SpO2 normal -Na 141, K 3.2, Cr 1.19 (baseline 1.5), WBC 9.8K, Hgb 15.2 -Troponin negative, BNP >1900 -CXR showed congestion, no frank edema and lungs were clear -ECG showed no ischemic changes -Alk phos and TBili were mildly up, stable from 1 year ago -She was given furosemide 80 mg IV, empiric cefazolin, potassium and morphine and TRH were asked to evaluate for CHF flare ?cellulitis     ROS: Review of Systems  Constitutional:  Negative for chills, fever and malaise/fatigue.  Respiratory: Positive for cough. Negative for sputum production, shortness of breath and wheezing.   Cardiovascular: Positive for leg swelling. Negative for chest pain, palpitations, orthopnea and PND.  All other systems reviewed and are negative.         Past Medical History:  Diagnosis Date  . CAD (coronary artery disease) 05/03/14; 05/09/14   a. anterior STEMI with early in-stent thorombosis for missed dose of Brillinta s/p PCI with DESx 2 into LAD (04/2014)  . CHF (congestive heart failure) ([place])   . GERD (gastroesophageal reflux disease)    Probable  . HTN (hypertension)   . Hyperlipidemia   . Ischemic cardiomyopathy    a. 04/2014 ECHO with EF 45-50% b.  Repeat 2D echo 08/14/14 with EF down at 15%. Life vest placed  . Non compliance w medication regimen   . Tobacco use     Past Surgical History:  Procedure Laterality Date  . CHOLECYSTECTOMY    . CORONARY ANGIOPLASTY WITH STENT PLACEMENT  05/03/14   STEMI- stent to LAD DES- Xience alpine  . CORONARY ANGIOPLASTY WITH STENT PLACEMENT  05/09/14   STEMI- overlapping stent to LAD, pt had missed a dose of Brilinta  . LEFT HEART CATHETERIZATION WITH CORONARY ANGIOGRAM N/A 05/03/2014   Procedure: LEFT HEART CATHETERIZATION WITH CORONARY ANGIOGRAM;  Surgeon: Peter M Martinique, MD;  Location: [facility] CATH  LAB;  Service: Cardiovascular;  Laterality: N/A;  . LEFT HEART CATHETERIZATION WITH CORONARY ANGIOGRAM N/A 05/09/2014   Procedure: LEFT HEART CATHETERIZATION WITH CORONARY ANGIOGRAM;  Surgeon: [name], MD;  Location: [facility] CATH LAB;  Service: Cardiovascular;  Laterality: N/A;  . PARTIAL HYSTERECTOMY    . PERCUTANEOUS STENT INTERVENTION  05/03/2014   Procedure: PERCUTANEOUS STENT INTERVENTION;  Surgeon: Peter M Martinique, MD;  Location: [facility] CATH LAB;  Service: Cardiovascular;;  DES Prox LAD     Social History: Patient lives alone.  The patient walks unassisted.  She can walk 2 miles without  problems, she claims.  She does not smoke.  She does not use alcohol.  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    Allergies  Allergen Reactions  . Aspirin Swelling    Chewable children's aspirin makes patients tongue and face swell  . Effient [Prasugrel] Swelling    Patient's tongue and face swells  . Entresto [Sacubitril-Valsartan] Other (See Comments)    dizziness  . Lactose Intolerance (Gi) Other (See Comments)    REACTION: stomach upset  . Robitussin Dm [Guaifenesin-Dm] Swelling    Patient's tongue swells  . Sulfa Antibiotics Swelling  . Wheat Bran Other (See Comments)    REACTION: unknown    Family history: family history includes Diabetes in her mother; Hypertension in her mother.  Prior to Admission medications   Medication Sig Start Date End Date Taking? Authorizing Provider  atorvastatin (LIPITOR) 80 MG tablet Take 1 tablet (80 mg total) by mouth daily at 6 PM. 07/06/16  Yes Clanford L Wynetta Emery, MD  cetirizine (ZYRTEC) 10 MG tablet Take 10 mg by mouth daily as needed for allergies.    Yes Historical Provider, MD  ferrous sulfate 325 (65 FE) MG tablet Take 325 mg by mouth 2 (two) times a week. tues, thurs   Yes Historical Provider, MD  furosemide (LASIX) 40 MG tablet Take 40 mg by mouth 2 (two) times daily.   Yes Historical Provider, MD  nitroGLYCERIN (NITROSTAT) 0.4 MG SL tablet Place 0.4 mg under the tongue every 5 (five) minutes as needed for chest pain.   Yes Historical Provider, MD  potassium chloride (K-DUR,KLOR-CON) 10 MEQ tablet Take 10 mEq by mouth daily.   Yes Historical Provider, MD  ticagrelor (BRILINTA) 60 MG TABS tablet Take 1 tablet (60 mg total) by mouth 2 (two) times daily. 07/22/16  Yes Larey Dresser, MD       Physical Exam: BP (!) 154/87   Pulse 95   Temp 98 F (36.7 C) (Oral)   Resp (!) 22   SpO2 100%  General appearance: Well-developed, obese adult female, alert and in no acute distress, does not appear to be in respiratory distress.     Eyes: Anicteric, conjunctiva pink, lids and lashes normal. PERRL.    ENT: No nasal deformity, discharge, epistaxis.  Hearing normal. OP moist without lesions.   Neck: No neck masses.  Trachea midline.  No thyromegaly/tenderness. Lymph: No cervical or supraclavicular lymphadenopathy. Skin: Warm and dry.  The right lower leg is red at the ankle and foot.  There is no tenderness to palpation at the redness, it is just that the whole leg is painful for her to move, and she has lancinating pains there.  There is some serous drainage.   Cardiac: RRR, nl S1-S2, no murmurs appreciated.  Capillary refill is brisk.  JVP not visible.  Marked 3+ LE edema above knees, abdomen tight.  Radial pulses 2+ and symmetric. Respiratory:  Normal respiratory rate and rhythm.  CTAB without rales or wheezes. Abdomen: Abdomen soft.  No TTP. Somewhat distended, no ascites, HSM.   MSK: No deformities or effusions.  No cyanosis or clubbing. Neuro: Cranial nerves normal.  Sensation intact to light touch. Speech is fluent.  Muscle strength normal.    Psych: Sensorium intact and responding to questions, attention normal.  Behavior appropriate.  Affect normal.  Judgment and insight appear normal.     Labs on Admission:  I have personally reviewed following labs and imaging studies: CBC:  Recent Labs Lab 01/28/17 1455  WBC 9.8  NEUTROABS 7.4  HGB 15.2*  HCT 48.4*  MCV 90.6  PLT 176   Basic Metabolic Panel:  Recent Labs Lab 01/28/17 1455  NA 141  K 3.2*  CL 106  CO2 25  GLUCOSE 105*  BUN 18  CREATININE 1.19*  CALCIUM 9.1   GFR: CrCl cannot be calculated (Unknown ideal weight.).  Liver Function Tests:  Recent Labs Lab 01/28/17 1455  AST 29  ALT 20  ALKPHOS 220*  BILITOT 2.6*  PROT 6.6  ALBUMIN 3.4*   No results for input(s): LIPASE, AMYLASE in the last 168 hours. No results for input(s): AMMONIA in the last 168 hours. Coagulation Profile: No results for input(s): INR, PROTIME in the last 168  hours. Cardiac Enzymes:  Recent Labs Lab 01/28/17 1705  TROPONINI <0.03   BNP (last 3 results) No results for input(s): PROBNP in the last 8760 hours. HbA1C: No results for input(s): HGBA1C in the last 72 hours. CBG: No results for input(s): GLUCAP in the last 168 hours. Lipid Profile: No results for input(s): CHOL, HDL, LDLCALC, TRIG, CHOLHDL, LDLDIRECT in the last 72 hours. Thyroid Function Tests: No results for input(s): TSH, T4TOTAL, FREET4, T3FREE, THYROIDAB in the last 72 hours. Anemia Panel: No results for input(s): VITAMINB12, FOLATE, FERRITIN, TIBC, IRON, RETICCTPCT in the last 72 hours. Sepsis Labs: Invalid input(s): PROCALCITONIN, LACTICIDVEN No results found for this or any previous visit (from the past 240 hour(s)).       Radiological Exams on Admission: Personally reviewed CXR clear: Dg Chest 2 View  Result Date: 01/28/2017 CLINICAL DATA:  Left leg swelling for 3 days EXAM: CHEST  2 VIEW COMPARISON:  06/26/2016 FINDINGS: There is cardiomegaly with mild central congestion. No large effusion or focal consolidation. No pneumothorax. IMPRESSION: Cardiomegaly with slight central congestion. No acute infiltrate or edema. Electronically Signed   By: Donavan Foil M.D.   On: 01/28/2017 19:02    EKG: Independently reviewed. Rate 96, QTc 467, very small voltages.  No ST changes.  Echocardiogram report reviewed Apr 2017: EF 15% PAP 40 Severe TR    Assessment/Plan  1. Acute on chronic systolic and diastolic CHF:  Leg swelling bilaterally, weeping, poor medication knowledge, suspected noncompliance, unclear if her PCP stopped her BB, ACEi, spiro, hydral/nitrates, etc or if she just stopped them.     -Furosemide 80 mg BID -Potassium suppl -Strict I/Os, daily weights, suspect dry weight 180 lbs -Daily BMP -Consult to HF team -Failed out of community paramedicine team -Restart ACEi -Restart BB low dose, given not floridly decompensated   2. Leg swelling:  This is  I think purely from #1.  DVT ruled out with [place] in ER. Given cefazolin, but I will monitor off abx. -Elevate leg when in bed  3. Hypokalemia:  Mild. -Suppl K -Check mag  4. Elevated LFTs:  Suspect congestion.  Present before.  -Diurese and trend LFTs  5. Hypertension:  Hypertensive at admission.  Nonadherent. Was previously on carvedilol 6.25 BID, lisinopril 2.5 bid (before Entresto, which she did not tolerate), spiro 25, hydralazine 12.5 TID, and Imdur 30 -Restart BB, ACEi for now  6. Coronary artery disease:  -Continue statin and Brilinta  7. CKD:  Baseline Cr 1.3, eGFR ~50.   -Daily BMP    DVT prophylaxis: Lovenox  Code Status: FULL  Family Communication: None present  Disposition Plan: Anticipate IV diuresis and monitor Consults called: Cardiology consulted via Inbasket Admission status: INPATIENT         Medical decision making: Patient seen at 9:00 PM on 01/28/2017.  The patient was discussed with Dr. Roxanne Mins.  What exists of the patient's chart was reviewed in depth and summarized above.  Clinical condition: stable.        Edwin Dada [facility] Pager [phone]       At the time of admission, it appears that the appropriate admission status for this patient is INPATIENT. This is judged to be reasonable and necessary in order to provide the required intensity of service to ensure the patient's safety given the presenting symptoms, physical exam findings, and initial radiographic and laboratory data in the context of their chronic comorbidities.  Together, these circumstances are felt to place her at high risk for further clinical deterioration threatening life, limb, or organ.   Patient requires inpatient status due to high intensity of service, high risk for further deterioration and high frequency of surveillance required because of this severe exacerbation of their chronic organ failure.  Factors that support inpatient status include:    Chronic systolic and diastolic congestive heart failure with last EF 15%, no ICD, poor medication compliance, uncontrolled hypertension, history of myocardial infarction presenting with congestive heart failure, likely will need several days IV diuresis  I certify that at the point of admission it is my clinical judgment that the patient will require inpatient hospital care spanning beyond 2 midnights from the point of admission and that early discharge would result in unnecessary risk of decompensation and readmission or threat to life, limb or bodily function.

## 2017-01-28 NOTE — ED Provider Notes (Signed)
[facility]-EMERGENCY DEPT Provider Note   CSN: [id number] Arrival date & time: 01/28/17  1416     History   Chief Complaint Chief Complaint  Patient presents with  . Leg Swelling    HPI Deborah Ho is a 66 y.o. female.  She noted onset 4 days ago that her left leg was weeping clear fluid. Her right leg is starting to weep clear fluid today. She noted onset yesterday of some swelling in the left leg. She states that she has been compliant with her furosemide, and also takes metallic zone on a when necessary basis. She denies chest pain, dyspnea. She denies nausea or vomiting. She states that she is had some kind of a stomach illness for the last several weeks and did have a fever 2 weeks ago, but none today. She did notice today that her left leg was bright red. Her legs are not painful, but she states that they are itchy.   The history is provided by the patient.    Past Medical History:  Diagnosis Date  . CAD (coronary artery disease) 05/03/14; 05/09/14   a. anterior STEMI with early in-stent thorombosis for missed dose of Brillinta s/p PCI with DESx 2 into LAD (04/2014)  . CHF (congestive heart failure) (HCC)   . GERD (gastroesophageal reflux disease)    Probable  . HTN (hypertension)   . Hyperlipidemia   . Ischemic cardiomyopathy    a. 04/2014 ECHO with EF 45-50% b.  Repeat 2D echo 08/14/14 with EF down at 15%. Life vest placed  . Non compliance w medication regimen   . Tobacco use     Patient Active Problem List   Diagnosis Date Noted  . Peripheral edema   . Abdominal distension 06/26/2016  . Anasarca 06/26/2016  . Chronic renal insufficiency   . Adjustment disorder with mixed anxiety and depressed mood 02/28/2015  . Pleural effusion on right   . Essential hypertension   . Chronic systolic heart failure (HCC) 08/22/2014  . Angioedema of lips 08/17/2014  . CAD (coronary artery disease)   . Ischemic cardiomyopathy 08/13/2014  . Coronary atherosclerosis of native coronary  artery 05/10/2014  . STEMI (ST elevation myocardial infarction) (HCC) 05/09/2014  . HLD (hyperlipidemia) 05/06/2014  . Tobacco abuse 05/06/2014  . HTN (hypertension) 05/06/2014  . Cardiomyopathy, ischemic, EF 30-35 % Echo 05/04/14. 05/06/2014  . ST elevation myocardial infarction (STEMI) of anterior wall (HCC) 05/03/2014    Past Surgical History:  Procedure Laterality Date  . CHOLECYSTECTOMY    . CORONARY ANGIOPLASTY WITH STENT PLACEMENT  05/03/14   STEMI- stent to LAD DES- Xience alpine  . CORONARY ANGIOPLASTY WITH STENT PLACEMENT  05/09/14   STEMI- overlapping stent to LAD, pt had missed a dose of Brilinta  . LEFT HEART CATHETERIZATION WITH CORONARY ANGIOGRAM N/A 05/03/2014   Procedure: LEFT HEART CATHETERIZATION WITH CORONARY ANGIOGRAM;  Surgeon: Peter M Swaziland, MD;  Location: [facility] CATH LAB;  Service: Cardiovascular;  Laterality: N/A;  . LEFT HEART CATHETERIZATION WITH CORONARY ANGIOGRAM N/A 05/09/2014   Procedure: LEFT HEART CATHETERIZATION WITH CORONARY ANGIOGRAM;  Surgeon: Corky Crafts, MD;  Location: [facility] CATH LAB;  Service: Cardiovascular;  Laterality: N/A;  . PARTIAL HYSTERECTOMY    . PERCUTANEOUS STENT INTERVENTION  05/03/2014   Procedure: PERCUTANEOUS STENT INTERVENTION;  Surgeon: Peter M Swaziland, MD;  Location: [facility] CATH LAB;  Service: Cardiovascular;;  DES Prox LAD     OB History    No data available       Home Medications  Prior to Admission medications   Medication Sig Start Date End Date Taking? Authorizing Provider  atorvastatin (LIPITOR) 80 MG tablet Take 1 tablet (80 mg total) by mouth daily at 6 PM. 07/06/16   Clanford Cyndie Mull, MD  carvedilol (COREG) 3.125 MG tablet Take 2 tablets (6.25 mg total) by mouth 2 (two) times daily. 08/06/16 11/04/16  Graciella Freer, PA-C  cetirizine (ZYRTEC) 10 MG tablet Take 10 mg by mouth daily as needed for allergies.     Historical Provider, MD  ferrous sulfate 325 (65 FE) MG tablet Take 325 mg by mouth 2 (two) times a  week.    Historical Provider, MD  hydrALAZINE (APRESOLINE) 25 MG tablet Take 0.5 tablets (12.5 mg total) by mouth 3 (three) times daily. 07/22/16   Laurey Morale, MD  isosorbide mononitrate (IMDUR) 30 MG 24 hr tablet Take 1 tablet (30 mg total) by mouth daily. 07/06/16   Graciella Freer, PA-C  nitroGLYCERIN (NITROSTAT) 0.4 MG SL tablet Place 0.4 mg under the tongue every 5 (five) minutes as needed for chest pain.    Historical Provider, MD  sacubitril-valsartan (ENTRESTO) 24-26 MG Take 1 tablet by mouth 2 (two) times daily. 07/30/16   Amy D Filbert Schilder, NP  ticagrelor (BRILINTA) 60 MG TABS tablet Take 1 tablet (60 mg total) by mouth 2 (two) times daily. 07/22/16   Laurey Morale, MD  torsemide (DEMADEX) 20 MG tablet Take 3 tablets (60 mg total) by mouth daily. 07/22/16   Laurey Morale, MD    Family History Family History  Problem Relation Age of Onset  . Diabetes Mother   . Hypertension Mother     Social History Social History  Substance Use Topics  . Smoking status: Current Some Day Smoker    Packs/day: 0.10    Years: 0.50    Types: Cigarettes  . Smokeless tobacco: Current User     Comment: down to 3 cigarettes daily (08/03/14)  . Alcohol use No     Allergies   Aspirin; Effient [prasugrel]; Lactose intolerance (gi); Robitussin dm [guaifenesin-dm]; Sulfa antibiotics; and Wheat bran   Review of Systems Review of Systems  All other systems reviewed and are negative.    Physical Exam Updated Vital Signs BP (!) 153/95 (BP Location: Left Arm)   Pulse 91   Temp 98 F (36.7 C) (Oral)   Resp 16   SpO2 100%   Physical Exam  Nursing note and vitals reviewed.  66 year old female, resting comfortably and in no acute distress. Vital signs are significant for hypertension. Oxygen saturation is 100%, which is normal. Head is normocephalic and atraumatic. PERRLA, EOMI. Oropharynx is clear. Neck is nontender and supple without adenopathy or JVD. Back is nontender and there is no CVA  tenderness. Lungs are clear without rales, wheezes, or rhonchi. Chest is nontender. Heart has regular rate and rhythm without murmur. Abdomen is soft, flat, nontender without masses or hepatosplenomegaly and peristalsis is normoactive. Extremities: Left calf circumferences 3 cm greater than right calf circumference. There is 1+ pitting edema of the right lower leg with 3-4+ pitting edema of the left leg. There is erythema of the left lower leg with slight warmth. There is serous drainage from the posterior aspect of the left lower leg. I can see one spot where there is slight serous drainage from the right lower leg. Right leg does demonstrate moderate venous stasis changes. There is also 1+ presacral edema and trace edema on the abdomen and arms indicating anasarca.  Skin is warm and dry without rash. Neurologic: Mental status is normal, cranial nerves are intact, there are no motor or sensory deficits.  ED Treatments / Results  Labs (all labs ordered are listed, but only abnormal results are displayed) Labs Reviewed  CBC WITH DIFFERENTIAL/PLATELET - Abnormal; Notable for the following:       Result Value   RBC 5.34 (*)    Hemoglobin 15.2 (*)    HCT 48.4 (*)    All other components within normal limits  COMPREHENSIVE METABOLIC PANEL - Abnormal; Notable for the following:    Potassium 3.2 (*)    Glucose, Bld 105 (*)    Creatinine, Ser 1.19 (*)    Albumin 3.4 (*)    Alkaline Phosphatase 220 (*)    Total Bilirubin 2.6 (*)    GFR calc non Af Amer 47 (*)    GFR calc Af Amer 54 (*)    All other components within normal limits  URINALYSIS, ROUTINE W REFLEX MICROSCOPIC - Abnormal; Notable for the following:    Hgb urine dipstick SMALL (*)    Protein, ur 30 (*)    Bacteria, UA RARE (*)    Squamous Epithelial / LPF 0-5 (*)    All other components within normal limits  TROPONIN I  BRAIN NATRIURETIC PEPTIDE  SEDIMENTATION RATE    EKG  EKG Interpretation  Date/Time:  Thursday January 28 2017 15:15:51 EDT Ventricular Rate:  96 PR Interval:  176 QRS Duration: 72 QT Interval:  370 QTC Calculation: 467 R Axis:   -68 Text Interpretation:  Normal sinus rhythm Possible Left atrial enlargement Left axis deviation Low voltage QRS Cannot rule out Anteroseptal infarct , age undetermined Abnormal ECG When compared with ECG of 06/26/2016, No significant change was found Confirmed by [facility]  MD, Odena Mcquaid ([id number]) on 01/28/2017 5:01:28 PM       Radiology Dg Chest 2 View  Result Date: 01/28/2017 CLINICAL DATA:  Left leg swelling for 3 days EXAM: CHEST  2 VIEW COMPARISON:  06/26/2016 FINDINGS: There is cardiomegaly with mild central congestion. No large effusion or focal consolidation. No pneumothorax. IMPRESSION: Cardiomegaly with slight central congestion. No acute infiltrate or edema. Electronically Signed   By: Jasmine Pang M.D.   On: 01/28/2017 19:02    Procedures Procedures (including critical care time)  Medications Ordered in ED Medications  furosemide (LASIX) injection 80 mg (80 mg Intravenous Given 01/28/17 1743)  ceFAZolin (ANCEF) IVPB 1 g/50 mL premix (0 g Intravenous Stopped 01/28/17 1814)  morphine 4 MG/ML injection 4 mg (4 mg Intravenous Given 01/28/17 1854)  potassium chloride SA (K-DUR,KLOR-CON) CR tablet 40 mEq (40 mEq Oral Given 01/28/17 1854)     Initial Impression / Assessment and Plan / ED Course  I have reviewed the triage vital signs and the nursing notes.  Pertinent labs & imaging results that were available during my care of the patient were reviewed by me and considered in my medical decision making (see chart for details).  Leg swelling and anasarca consistent with right-sided heart failure. Findings of left lower leg erythema and warmth suggesting possible cellulitis. Calf circumference discrepancy is worrisome for possible DVT, and she will be sent for venous Doppler. She is given a dose of furosemide in the ED. Review of past records does show admission in  August 2017 for acute on chronic systolic heart failure with ejection fraction of 15%. She had been a patient in the heart failure clinic but was lost to follow-up in October.  Venous Doppler shows no evidence of DVT. Sedimentation rate and BNP are still pending. Chest x-ray is consistent with CHF. Initial laboratory findings are elevated total bilirubin and elevated alkaline phosphatase. These are chronic but worse than most recent values. Hypokalemia is present and she is given a dose of oral potassium. She will be losing more potassium with diuresis. Case is discussed with Dr. Maryfrances Bunnell of [facility] who agrees to admit the patient.  Final Clinical Impressions(s) / ED Diagnoses   Final diagnoses:  Acute on chronic systolic heart failure (HCC)  Cellulitis of left lower leg  Hypokalemia  Renal insufficiency  Serum total bilirubin elevated  Elevated alkaline phosphatase level    New Prescriptions New Prescriptions   No medications on file     Dione Booze, MD 01/28/17 2117

## 2017-01-28 NOTE — Progress Notes (Signed)
VASCULAR LAB PRELIMINARY  PRELIMINARY  PRELIMINARY  PRELIMINARY  Left lower extremity venous duplex completed.    Preliminary report:  Technically difficult study due to pain.. Patient unable to fully tolerate the compressions. No obvious evidence of DVT in the segments able to be visualized. Doppler flow signals were pulsatile consistent with CHF or fluid overload. Enlargement of the inguinal lymph nodes were noted No obvious vidence of superficial thrombosis or Baker's cyst. Moderate interstitial fluid noted throughout the lower extremity especially in the calf area.  Letti Towell, RVS 01/28/2017, 7:41 PM

## 2017-01-28 NOTE — ED Notes (Signed)
Patient would only take meds with applesauce

## 2017-01-28 NOTE — ED Notes (Signed)
Admitting Provider at bedside. 

## 2017-01-28 NOTE — ED Triage Notes (Signed)
Pt reports left leg swelling that started on Monday but has progressively getting worse. The back of her leg is weeping serous drainage. Denies CP/SOB.  Right leg, ankle and foot appears red and swollen. No swelling to right leg.

## 2017-01-29 ENCOUNTER — Encounter (HOSPITAL_COMMUNITY): Payer: Self-pay | Admitting: *Deleted

## 2017-01-29 DIAGNOSIS — N183 Chronic kidney disease, stage 3 (moderate): Secondary | ICD-10-CM

## 2017-01-29 DIAGNOSIS — I2510 Atherosclerotic heart disease of native coronary artery without angina pectoris: Secondary | ICD-10-CM

## 2017-01-29 DIAGNOSIS — I5043 Acute on chronic combined systolic (congestive) and diastolic (congestive) heart failure: Secondary | ICD-10-CM

## 2017-01-29 LAB — BASIC METABOLIC PANEL
Anion gap: 16 — ABNORMAL HIGH (ref 5–15)
BUN: 14 mg/dL (ref 6–20)
CO2: 23 mmol/L (ref 22–32)
Calcium: 9.3 mg/dL (ref 8.9–10.3)
Chloride: 103 mmol/L (ref 101–111)
Creatinine, Ser: 1.17 mg/dL — ABNORMAL HIGH (ref 0.44–1.00)
GFR calc Af Amer: 55 mL/min — ABNORMAL LOW (ref >60)
GFR calc non Af Amer: 48 mL/min — ABNORMAL LOW (ref >60)
Glucose, Bld: 70 mg/dL (ref 65–99)
Potassium: 4 mmol/L (ref 3.5–5.1)
Sodium: 142 mmol/L (ref 135–145)

## 2017-01-29 LAB — CBC
HCT: 46.8 % — ABNORMAL HIGH (ref 36.0–46.0)
Hemoglobin: 14.6 g/dL (ref 12.0–15.0)
MCH: 28.2 pg (ref 26.0–34.0)
MCHC: 31.2 g/dL (ref 30.0–36.0)
MCV: 90.5 fL (ref 78.0–100.0)
Platelets: 291 10*3/uL (ref 150–400)
RBC: 5.17 MIL/uL — ABNORMAL HIGH (ref 3.87–5.11)
RDW: 15.4 % (ref 11.5–15.5)
WBC: 11.6 10*3/uL — ABNORMAL HIGH (ref 4.0–10.5)

## 2017-01-29 MED ORDER — METOLAZONE 2.5 MG PO TABS
2.5000 mg | ORAL_TABLET | Freq: Once | ORAL | Status: AC
  Administered 2017-01-29: 2.5 mg via ORAL
  Filled 2017-01-29: qty 1

## 2017-01-29 MED ORDER — FUROSEMIDE 10 MG/ML IJ SOLN
80.0000 mg | Freq: Three times a day (TID) | INTRAMUSCULAR | Status: DC
  Administered 2017-01-29 – 2017-02-03 (×15): 80 mg via INTRAVENOUS
  Filled 2017-01-29 (×15): qty 8

## 2017-01-29 MED ORDER — CEFAZOLIN IN D5W 1 GM/50ML IV SOLN
1.0000 g | Freq: Three times a day (TID) | INTRAVENOUS | Status: DC
  Administered 2017-01-29 – 2017-02-03 (×15): 1 g via INTRAVENOUS
  Filled 2017-01-29 (×16): qty 50

## 2017-01-29 NOTE — Consult Note (Signed)
Advanced Heart Failure Team Consult Note  Referring Physician: Dr. Maryfrances Bunnell Primary Physician: Grayce Sessions, NP  Primary Cardiologist:  Dr. Shirlee Latch  Reason for Consultation: Chronic systolic CHF  HPI:    Deborah Ho is a 66 year old female with a past medical history of hypertension, hyperlipidemia, tobacco abuse, CAD status post anterior STEMI (DES x 2 to LAD) with early stent thrombosis after missing a dose of Brilinta in June 2015. Also with history of chronic systolic CHF last EF was 15% in April 2017.  Last admitted in August 2017 with marked volume overload. She was diuresed with IV Lasix 80mg  BID + 5mg  Metolazone. She was transitioned to 60mg  Torsemide daily, and discharged home on that dose. Discharge weight was 190 pounds. She was started on Entresto in September 2017. She was lost to follow-up after that.  Patient with history of severe noncompliance with meds and dietary restriction.  Presented to the ED on 01/28/2017 with bilateral lower extremity edema with left greater than right swelling. Pertinent admission labs - BNP 1920, creatinine 1.19, chest x ray with pulmonary congestion.   Copious serous weeping is noted from the left lower extremity. She has been taking Lasix 40mg  daily for the past 3 weeks instead of twice a day, and has been less compliant with her medicines as she had a GI illness about 2 weeks ago and couldn't keep any medicines down. Does not feel SOB at rest, can walk up stairs without SOB. She walks in the grocery store without SOB. Denies orthopnea, PND. Denies chest pain. She has been eating high salt foods including frozen dinners and has been eating potato chips daily as she thought she needed extra salt. Drinking a lot of Ginger Ale  She thinks she has been told in the past that people with weak hearts have a deficiency in salt. Does not weigh daily at home. Last weight at clinic is 192 pounds. Her was stopped by her PCP for dizziness, she is  unsure of exactly when it was stopped. Notes she has developed a cough in the past 2 weeks.  Admission weight was 230 pounds, has gotten 80mg  Lasix for 2 doses so far, - October 2017 and weight remains 229 pounds.   Review of Systems: [y] = yes, [ ]  = no   General: Weight gain [ y]; Weight loss [ ] ; Anorexia [ ] ; Fatigue [ y]; Fever [ ] ; Chills [ ] ; Weakness 01/30/2017 ]  Cardiac: Chest pain/pressure [ ] ; Resting SOB [ ] ; Exertional SOB [ ] ; Orthopnea [ ] ; Pedal Edema ]; Palpitations [ ] ; Syncope [ ] ; Presyncope [ ] ; Paroxysmal nocturnal dyspnea[ ]   Pulmonary: Cough [ y]; Wheezing[ ] ; Hemoptysis[ ] ; Sputum [ ] ; Snoring [ ]   GI: Vomiting[ n]; Dysphagia[ ] ; Melena[ ] ; Hematochezia [ ] ; Heartburn[ ] ; Abdominal pain [ ] ; Constipation [ ] ; Diarrhea [ ] ; BRBPR [ ]   GU: Hematuria[ ] ; Dysuria [ ] ; Nocturia[ ]   Vascular: Pain in legs with walking Deborah Ho ]; Pain in feet with lying flat [ ] ; Non-healing sores [ ] ; Stroke [ ] ; TIA [ ] ; Slurred speech [ ] ;  Neuro: Headaches[ ] ; Vertigo[ ] ; Seizures[ ] ; Paresthesias[ ] ;Blurred vision [ ] ; Diplopia [ ] ; Vision changes [ ]   Ortho/Skin: Arthritis ]; Joint pain ]; Muscle pain [ ] ; Joint swelling [ ] ; Back Pain [ ] ; Rash [ ]   Psych: Depression[ ] ; Anxiety[ ]   Heme: Bleeding problems [ ] ; Clotting disorders [ ] ; Anemia [ ]   Endocrine: Diabetes [ ] ; Thyroid dysfunction[ ]   Home Medications Prior to Admission medications   Medication Sig Start Date End Date Taking? Authorizing Provider  atorvastatin (LIPITOR) 80 MG tablet Take 1 tablet (80 mg total) by mouth daily at 6 PM. 07/06/16  Yes Clanford L Laural Benes, MD  cetirizine (ZYRTEC) 10 MG tablet Take 10 mg by mouth daily as needed for allergies.    Yes Historical Provider, MD  ferrous sulfate 325 (65 FE) MG tablet Take 325 mg by mouth 2 (two) times a week. tues, thurs   Yes Historical Provider, MD  furosemide (LASIX) 40 MG tablet Take 40 mg by mouth 2 (two) times daily.   Yes Historical Provider, MD  nitroGLYCERIN (NITROSTAT)  0.4 MG SL tablet Place 0.4 mg under the tongue every 5 (five) minutes as needed for chest pain.   Yes Historical Provider, MD  potassium chloride (K-DUR,KLOR-CON) 10 MEQ tablet Take 10 mEq by mouth daily.   Yes Historical Provider, MD  ticagrelor (BRILINTA) 60 MG TABS tablet Take 1 tablet (60 mg total) by mouth 2 (two) times daily. 07/22/16  Yes Laurey Morale, MD    Past Medical History: Past Medical History:  Diagnosis Date  . CAD (coronary artery disease) 05/03/14; 05/09/14   a. anterior STEMI with early in-stent thorombosis for missed dose of Brillinta s/p PCI with DESx 2 into LAD (04/2014)  . CHF (congestive heart failure) (HCC)   . GERD (gastroesophageal reflux disease)    Probable  . HTN (hypertension)   . Hyperlipidemia   . Ischemic cardiomyopathy    a. 04/2014 ECHO with EF 45-50% b.  Repeat 2D echo 08/14/14 with EF down at 15%. Life vest placed  . Non compliance w medication regimen   . Tobacco use     Past Surgical History: Past Surgical History:  Procedure Laterality Date  . CHOLECYSTECTOMY    . CORONARY ANGIOPLASTY WITH STENT PLACEMENT  05/03/14   STEMI- stent to LAD DES- Xience alpine  . CORONARY ANGIOPLASTY WITH STENT PLACEMENT  05/09/14   STEMI- overlapping stent to LAD, pt had missed a dose of Brilinta  . LEFT HEART CATHETERIZATION WITH CORONARY ANGIOGRAM N/A 05/03/2014   Procedure: LEFT HEART CATHETERIZATION WITH CORONARY ANGIOGRAM;  Surgeon: Peter M Swaziland, MD;  Location: [facility] CATH LAB;  Service: Cardiovascular;  Laterality: N/A;  . LEFT HEART CATHETERIZATION WITH CORONARY ANGIOGRAM N/A 05/09/2014   Procedure: LEFT HEART CATHETERIZATION WITH CORONARY ANGIOGRAM;  Surgeon: Corky Crafts, MD;  Location: [facility] CATH LAB;  Service: Cardiovascular;  Laterality: N/A;  . PARTIAL HYSTERECTOMY    . PERCUTANEOUS STENT INTERVENTION  05/03/2014   Procedure: PERCUTANEOUS STENT INTERVENTION;  Surgeon: Peter M Swaziland, MD;  Location: [facility] CATH LAB;  Service: Cardiovascular;;  DES Prox LAD      Family History: Family History  Problem Relation Age of Onset  . Diabetes Mother   . Hypertension Mother     Social History: Social History   Social History  . Marital status: Widowed    Spouse name: N/A  . Number of children: N/A  . Years of education: N/A   Occupational History  . not employed    Social History Main Topics  . Smoking status: Current Some Day Smoker    Packs/day: 0.10    Years: 0.50    Types: Cigarettes  . Smokeless tobacco: Current User     Comment: down to 3 cigarettes daily (08/03/14)  . Alcohol use No  . Drug use: No  . Sexual activity: Not  Asked   Other Topics Concern  . None   Social History Narrative   Patient has 6 brothers and sisters and none have known coronary artery disease. She lives alone in [place], but has several brothers in the area.    Allergies:  Allergies  Allergen Reactions  . Aspirin Swelling    Chewable children's aspirin makes patients tongue and face swell  . Effient [Prasugrel] Swelling    Patient's tongue and face swells  . Entresto [Sacubitril-Valsartan] Other (See Comments)    dizziness  . Lactose Intolerance (Gi) Other (See Comments)    REACTION: stomach upset  . Robitussin Dm [Guaifenesin-Dm] Swelling    Patient's tongue swells  . Sulfa Antibiotics Swelling  . Wheat Bran Other (See Comments)    REACTION: unknown    Objective:    Vital Signs:   Temp:  [97.8 F (36.6 C)-99.1 F (37.3 C)] 97.8 F (36.6 C) (03/16 0457) Pulse Rate:  [86-95] 91 (03/16 0457) Resp:  [16-22] 16 (03/16 0457) BP: (142-170)/(78-95) 148/89 (03/16 0751) SpO2:  [98 %-100 %] 99 % (03/16 0457) FiO2 (%):  [0 %] 0 % (03/15 2226) Weight:  [229 lb 1.6 oz (103.9 kg)-230 lb 9.6 oz (104.6 kg)] 229 lb 1.6 oz (103.9 kg) (03/16 0457) Last BM Date: 01/28/17  Weight change: Filed Weights   01/28/17 2220 01/29/17 0457  Weight: 230 lb 9.6 oz (104.6 kg) 229 lb 1.6 oz (103.9 kg)    Intake/Output:   Intake/Output Summary (Last 24  hours) at 01/29/17 0754 Last data filed at 01/29/17 0720  Gross per 24 hour  Intake              760 ml  Output             1650 ml  Net             -890 ml     Physical Exam: General: Obese female in NAD, sitting on edge of bed.  HEENT: normal, atraumatic.  Neck: supple. JVP to ear. Prominent CV waves Carotids 2+ bilat; no bruits. No lymphadenopathy or thyromegaly appreciated. Cor: PMI nondisplaced. Regular rate & rhythm. No rubs, gallops or murmurs. Lungs: Diminished crackles in bases. No wheeze  Abdomen: soft, obese, nontender, nondistended. No hepatosplenomegaly. No bruits or masses. +BS . Extremities: no cyanosis, clubbing, rash, 3+ bilateral pretibial edema or R 4+ on left with weeping area  Neuro: alert & orientedx3, cranial nerves grossly intact. moves all 4 extremities w/o difficulty. Affect pleasant  Telemetry: NSR occasional PVCs- personally reviewed.   Labs: Basic Metabolic Panel:  Recent Labs Lab 01/28/17 1455 01/28/17 1705 01/29/17 0527  NA 141  --  142  K 3.2*  --  4.0  CL 106  --  103  CO2 25  --  23  GLUCOSE 105*  --  70  BUN 18  --  14  CREATININE 1.19*  --  1.17*  CALCIUM 9.1  --  9.3  MG  --  2.0  --     Liver Function Tests:  Recent Labs Lab 01/28/17 1455  AST 29  ALT 20  ALKPHOS 220*  BILITOT 2.6*  PROT 6.6  ALBUMIN 3.4*    CBC:  Recent Labs Lab 01/28/17 1455 01/29/17 0527  WBC 9.8 11.6*  NEUTROABS 7.4  --   HGB 15.2* 14.6  HCT 48.4* 46.8*  MCV 90.6 90.5  PLT 292 291    Cardiac Enzymes:  Recent Labs Lab 01/28/17 1705  TROPONINI <0.03    BNP:  BNP (last 3 results)  Recent Labs  06/26/16 1441 08/06/16 1600 01/28/17 2047  BNP 1,443.5* 263.7* 1,920.0*    Other results: EKG:  normal sinus rhythm, unchanged from previous tracings.  Imaging: Dg Chest 2 View  Result Date: 01/28/2017 CLINICAL DATA:  Left leg swelling for 3 days EXAM: CHEST  2 VIEW COMPARISON:  06/26/2016 FINDINGS: There is cardiomegaly with mild  central congestion. No large effusion or focal consolidation. No pneumothorax. IMPRESSION: Cardiomegaly with slight central congestion. No acute infiltrate or edema. Electronically Signed   By: Jasmine Pang M.D.   On: 01/28/2017 19:02      Medications:     Current Medications: . atorvastatin  80 mg Oral q1800  . carvedilol  3.125 mg Oral BID WC  . enoxaparin (LOVENOX) injection  40 mg Subcutaneous Q24H  . [START ON 02/02/2017] ferrous sulfate  325 mg Oral Once per day on Tue Thu  . furosemide  80 mg Intravenous BID  . lisinopril  5 mg Oral Daily  . potassium chloride  40 mEq Oral BID  . ticagrelor  60 mg Oral BID       Assessment/Plan   1. Acute on chronic combined systolic and diastolic CHF, ICM: NYHA II, Last EF was 15% in April 2017, now presents with marked volume overload in the setting of medication noncompliance. BNP elevated at 1920. Last weight in clinic (07/2016) was 192 pounds, up to 230 pounds at admission. - Continue Coreg 3.125mg  BID.  - Increase IV diuresis to 80mg  Lasix TID.  - Give dose of 2.5 mg metolazone today.  - Stop ACE and start losartan 25mg  in anticipation of restarting Entresto  - Daily weights, strict I&O's.  2. Acute on chronic CKD stage III: Baseline creatinine is 1.5-1.6 - Creatinine 1.17 today - Follow with daily BMET 3. History of CAD: With DES to LAD in 04/2014 with readmission the following week for acute in-stent thrombosis.  - Continue Brilinta and ASA.  - Brilinta reduced to maintenance dose of 60mg  daily in June 2016 - Denies chest pain.  4. Noncompliance: She continues to have poor insight into her condition. She needs more education about low sodium diet and restricting fluids.  - Talked with her about not eating chips, frozen meals daily.  - Will provide with heart failure education booklet.  5. Tobacco abuse:  - Encouraged cessation.  6. HLD:  - Continue high intensity statin.  7. L>>R lower extremity edema.  - u/s reviewed.  Negative for DVT  Length of Stay: 1  05/2014, NP  01/29/2017, 7:54 AM  Advanced Heart Failure Team Pager [phone] (M-F; 7a - 4p)  Please contact [facility] for night-coverage after hours (4p -7a ) and weekends on [email or web address]  Patient seen and examined with Little Ishikawa, NP. We discussed all aspects of the encounter. I agree with the assessment and plan as stated above.   Patient well known to me from previously HF admissions. She is markedly noncompliant with dietary and medication regimen. She has poor insight into her condition and does not respond well to HF education efforts or attempts at closer f/u.   Markedly volume overloaded on exam. Agree with IV diuresis + metolazone. Will place UNNA boots. Renal function stable will watch closely with diuresis. LLE u/s negative for DVT.   Continue Brillinta and ASA for CAD.   Parrish Bonn,MD 4:00 PM

## 2017-01-29 NOTE — Care Management Note (Signed)
Case Management Note  Patient Details  Name: Legaci Tarman MRN: [id number] Date of Birth: October 12, 1951  Subjective/Objective:     Admitted with CHF              Action/Plan: PCP: EDWARDS, Kinnie Scales, NP; has private insurance with Medicare;  Patient is very well know to me from previous admission; CM talked to patient about the importance of having a HHRN; patient is agreeable and HHC choices offered, patient chose [facility]; Brad with [facility] called for arrangement - Disease Management Program for CHF.  Expected Discharge Date:  01/31/17               Expected Discharge Plan:  Home/Self Care  Discharge planning Services  CM Consult    HH Arranged:  RN [facility] Agency:  [facility]  Status of Service:  In process, will continue to follow  [name] [phone] 01/29/2017, 3:57 PM

## 2017-01-29 NOTE — Progress Notes (Signed)
Ortho technician paged at [phone] to request Neomia Dear boot for Ms. Krog.  Awaiting return call to confirm if they have order.

## 2017-01-29 NOTE — Progress Notes (Addendum)
Heart Failure Navigator Consult Note  Presentation: Per Suzzette Righter NP Bennett Vanscyoc is a 66 year old female with a past medical history of hypertension, hyperlipidemia, tobacco abuse, CAD status post anterior STEMI (DES x 2 to LAD) with early stent thrombosis after missing a dose of Brilinta in June 2015. Also with history of chronic systolic CHF last EF was 15% in April 2017.  Last admitted in August 2017 with marked volume overload. She was diuresed with IV Lasix and transitioned to 60mg  Torsemide daily. Her discharge weight was 190 pounds. She was started on Entresto in September 2017. She was lost to follow-up after that.  Presented to the ED on 01/28/2017 with bilateral lower extremity edema with left greater than right swelling. Pertinent admission labs - BNP 1920, creatinine 1.19, chest x ray with pulmonary congestion.   Copious serous weeping is noted from the left lower extremity. She has been taking Lasix 40mg  daily for the past 3 weeks instead of twice a day, and has been less compliant with her medicines as she had a GI illness about 2 weeks ago and couldn't keep any medicines down. Does not feel SOB at rest, can walk up stairs without SOB. She walks in the grocery store without SOB. Denies orthopnea, PND. Denies chest pain. She has been eating high salt foods including frozen dinners and has been eating potato chips daily as she thought she needed extra salt. She thinks she has been told in the past that people with weak hearts have a deficiency in salt. Does not weigh daily at home. Last weight at clinic is 192 pounds. Her 01/30/2017 was stopped by her PCP for dizziness, she is unsure of exactly when it was stopped. Notes she has developed a cough in the past 2 weeks.  Admission weight was 230 pounds, has gotten 80mg  Lasix for 2 doses so far, has had 1400 ml out.     Past Medical History:  Diagnosis Date  . CAD (coronary artery disease) 05/03/14; 05/09/14   a. anterior STEMI with  early in-stent thorombosis for missed dose of Brillinta s/p PCI with DESx 2 into LAD (04/2014)  . CHF (congestive heart failure) (HCC)   . GERD (gastroesophageal reflux disease)    Probable  . HTN (hypertension)   . Hyperlipidemia   . Ischemic cardiomyopathy    a. 04/2014 ECHO with EF 45-50% b.  Repeat 2D echo 08/14/14 with EF down at 15%. Life vest placed  . Non compliance w medication regimen   . Tobacco use     Social History   Social History  . Marital status: Widowed    Spouse name: N/A  . Number of children: N/A  . Years of education: N/A   Occupational History  . not employed    Social History Main Topics  . Smoking status: Current Some Day Smoker    Packs/day: 0.10    Years: 0.50    Types: Cigarettes  . Smokeless tobacco: Current User     Comment: down to 3 cigarettes daily (08/03/14)  . Alcohol use No  . Drug use: No  . Sexual activity: Not Asked   Other Topics Concern  . None   Social History Narrative   Patient has 6 brothers and sisters and none have known coronary artery disease. She lives alone in [place], but has several brothers in the area.    ECHO: 02/21/16 Study Conclusions  - Procedure narrative: Transthoracic echocardiography. Image   quality was adequate. The study was technically difficult. -  Left ventricle: The cavity size was normal. Wall thickness was   increased in a pattern of mild LVH. Systolic function was   severely reduced. The estimated ejection fraction was 15%.   Diffuse hypokinesis. Doppler parameters are consistent with   restrictive physiology, indicative of decreased left ventricular   diastolic compliance and/or increased left atrial pressure.   Doppler parameters are consistent with high ventricular filling   pressure. - Mitral valve: Calcified annulus. There was mild regurgitation. - Left atrium: The atrium was moderately dilated. - Right ventricle: Systolic function was moderately reduced. - Right atrium: The atrium was  moderately dilated. - Tricuspid valve: There was severe regurgitation. - Pulmonary arteries: Systolic pressure was mildly increased. PA   peak pressure: 40 mm Hg (S). - Pericardium, extracardiac: There was a left pleural effusion.  Impressions:  - Severe global reduction in LV function; restrictive filling with   elevated filling pressure; moderate biatrial enlargement;   moderately reduced RV function; mild MR; severe TR; mildly   elevated pulmonary pressure.  BNP    Component Value Date/Time   BNP 1,920.0 (H) 01/28/2017 2047   BNP 2,344.6 (H) 04/24/2015 1350    ProBNP    Component Value Date/Time   PROBNP 321.0 (H) 11/05/2014 1143     Education Assessment and Provision:  Detailed education and instructions provided on heart failure disease management including the following:  Signs and symptoms of Heart Failure When to call the physician Importance of daily weights Low sodium diet Fluid restriction Medication management Anticipated future follow-up appointments  Patient education given on each of the above topics.  Patient acknowledges understanding and acceptance of all instructions.    I know Ms. Sozio very well from prior hospital admissions and AHF Clinic visits.  She was previously in the HF [facility] and was discharged due to inability to reach her by phone / call back (to schedule home visits).  She remains with very poor insight even after repeated education regarding HF recommendations for home.  She asks me again today about fluids/water "is it best hot, cold, early, late?" and again I review with her that we are primarily concerned with the amount of fluid she takes each day not anything else.  I also reviewed low sodium diet and high sodium foods to avoid.  She tells me that she does not eat anything "bad"--however also says that her BP was "low" (116/74) at home and the people in the restaurant advised her to have "[place] Ribs and a  baked potato with butter and sour cream to bring up her pressure".  She is quite difficult-- as when speaking with her and reviewing information she seems to have understanding and can teach back--however repeatedly goes home and does something completely different than what we discussed.  I also suspect that she rarely takes prescribed medications at home and continually fixates on her BP being too low making her "sicker".  She denies any issues affording or getting prescribed medications.  She will follow in the AHF Clinic after discharge.  Education Materials:  "Living Better With Heart Failure" Booklet, Daily Weight Tracker Tool    High Risk Criteria for Readmission and/or Poor Patient Outcomes:   EF <30%- 15%  2 or more admissions in 6 months- no  Difficult social situation- yes--? Understanding and insight, health literacy  Demonstrates medication noncompliance- yes-she has admitted to not taking medications as prescribed in the past    Barriers of Care:  Insight into disease process, health  literacy, compliance  Discharge Planning:   Plans to return home alone.  She has not been successful with [facility] in the past due to inability to schedule home visits and noncompliance with medical regimen.

## 2017-01-29 NOTE — Progress Notes (Signed)
Pharmacy Antibiotic Note  Deborah Ho is a 66 y.o. female admitted on 01/28/2017 with cellulitis.  Pharmacy has been consulted for ancef dosing. crcl ~ 55 ml/min, received 1g ancef in the ED at 1800 last night  Plan: Ancef 1g IV Q 8 hrs Pharmacy sign off  Height: 5\' 3"  (160 cm) Weight: 229 lb 1.6 oz (103.9 kg) (Scale C) IBW/kg (Calculated) : 52.4  Temp (24hrs), Avg:98.2 F (36.8 C), Min:97.8 F (36.6 C), Max:99.1 F (37.3 C)   Recent Labs Lab 01/28/17 1455 01/29/17 0527  WBC 9.8 11.6*  CREATININE 1.19* 1.17*    Estimated Creatinine Clearance: 55.2 mL/min (A) (by C-G formula based on SCr of 1.17 mg/dL (H)).    Allergies  Allergen Reactions  . Aspirin Swelling    Chewable children's aspirin makes patients tongue and face swell  . Effient [Prasugrel] Swelling    Patient's tongue and face swells  . Entresto [Sacubitril-Valsartan] Other (See Comments)    dizziness  . Lactose Intolerance (Gi) Other (See Comments)    REACTION: stomach upset  . Robitussin Dm [Guaifenesin-Dm] Swelling    Patient's tongue swells  . Sulfa Antibiotics Swelling  . Wheat Bran Other (See Comments)    REACTION: unknown    Antimicrobials this admission: Ancef 3/15 >>    Dose adjustments this admission:   Microbiology results:  Thank you for allowing pharmacy to be a part of this patient's care.  4/15, PharmD, BCPS  Clinical Pharmacist  Pager: [phone]   01/29/2017 12:10 PM

## 2017-01-29 NOTE — Progress Notes (Signed)
[facility] PROGRESS NOTE  Deborah Ho OIZ:[id number] DOB: Jul 06, 1951 DOA: 01/28/2017   PCP: Kerin Perna, NP     Assessment/Plan: Principal Problem:   Acute on chronic systolic and diastolic heart failure, NYHA class 1 (HCC) Active Problems:   Elevated LFTs   Coronary atherosclerosis of native coronary artery   Essential hypertension   CKD (chronic kidney disease), stage III   Hypokalemia   Deborah Ho is a 66 y.o. female with a past medical history significant for CHF EF 15%, medical noncompliance, HTN, and CAD s/p PCI/STEMI who presents with gradual leg swelling and pain and weeping.She was given furosemide 80 mg IV, empiric cefazolin,  TRH were asked to evaluate for CHF flare ?cellulitis  Assessment and plan 1. Acute on chronic systolic and diastolic CHF:  Leg swelling bilaterally, weeping, poor medication knowledge, suspected noncompliance, unclear if her PCP stopped her BB, ACEi, spiro, hydral/nitrates, etc or if she just stopped them.     -Furosemide 80 mg BID -Potassium suppl -Strict I/Os, daily weights, suspect dry weight 180 lbs -Daily BMP Consultant heart failure team Continue ACEi Continue beta blocker-Coreg   2. Leg lower extremity edema This is I think purely from #1. Negative vascular ultrasound. Given cefazolin, which will be continued for left lower extremity edema -Elevate leg when in bed  3. Hypokalemia:  Mild. Potassium repleted.4.0, magnesium 2.0   4. Elevated LFTs:  Suspect congestion.  Present before.  -Diurese and trend LFTs  5. Hypertension:  Hypertensive at admission.  Nonadherent. Was previously on carvedilol 6.25 BID, lisinopril 2.5 bid (before Entresto, which she did not tolerate), spiro 25, hydralazine 12.5 TID, and Imdur 30    6. Coronary artery disease:  -Continue statin and Brilinta  7. CKD:   Cr 1.17, eGFR ~50.   close to baseline -Daily BMP   DVT prophylaxsis Lovenox  Code Status:  Full  code    Family Communication: Discussed in detail with the patient, all imaging results, lab results explained to the patient   Disposition Plan:  Cardiology consult pending      Consultants:  Cardiology  Procedures: None  Antibiotics: Anti-infectives    Start     Dose/Rate Route Frequency Ordered Stop   01/28/17 1715  ceFAZolin (ANCEF) IVPB 1 g/50 mL premix     1 g 100 mL/hr over 30 Minutes Intravenous  Once 01/28/17 1707 01/28/17 1814         HPI/Subjective: Denies dyspnea on session, main complaint is bilateral lower extremity edema  Objective: Vitals:   01/28/17 2226 01/29/17 0110 01/29/17 0457 01/29/17 0751  BP:  (!) 142/78 (!) 150/88 (!) 148/89  Pulse:  86 91 89  Resp:  18 16   Temp:  99.1 F (37.3 C) 97.8 F (36.6 C) 97.9 F (36.6 C)  TempSrc:  Oral Oral Axillary  SpO2: 98% 98% 99% 99%  Weight:   103.9 kg (229 lb 1.6 oz)   Height:        Intake/Output Summary (Last 24 hours) at 01/29/17 0829 Last data filed at 01/29/17 0720  Gross per 24 hour  Intake              760 ml  Output             1650 ml  Net             -890 ml    Exam:  Examination:  General exam: Appears calm and comfortable  Respiratory system: Clear to  auscultation. Respiratory effort normal. Cardiovascular system: S1 & S2 heard, RRR. No JVD, murmurs, rubs, gallops or clicks. 2+ pitting Gastrointestinal system: Abdomen is nondistended, soft and nontender. No organomegaly or masses felt. Normal bowel sounds heard. Central nervous system: Alert and oriented. No focal neurological deficits. Extremities: Symmetric 5 x 5 power.Erythema and induration in the left lower extremity Skin: No rashes, lesions or ulcers Psychiatry: Judgement and insight appear normal. Mood & affect appropriate.     Data Reviewed: I have personally reviewed following labs and imaging studies  Micro Results No results found for this or any previous visit (from the past 240 hour(s)).  Radiology  Reports Dg Chest 2 View  Result Date: 01/28/2017 CLINICAL DATA:  Left leg swelling for 3 days EXAM: CHEST  2 VIEW COMPARISON:  06/26/2016 FINDINGS: There is cardiomegaly with mild central congestion. No large effusion or focal consolidation. No pneumothorax. IMPRESSION: Cardiomegaly with slight central congestion. No acute infiltrate or edema. Electronically Signed   By: Donavan Foil M.D.   On: 01/28/2017 19:02     CBC  Recent Labs Lab 01/28/17 1455 01/29/17 0527  WBC 9.8 11.6*  HGB 15.2* 14.6  HCT 48.4* 46.8*  PLT 292 291  MCV 90.6 90.5  MCH 28.5 28.2  MCHC 31.4 31.2  RDW 15.2 15.4  LYMPHSABS 1.6  --   MONOABS 0.7  --   EOSABS 0.1  --   BASOSABS 0.0  --     Chemistries   Recent Labs Lab 01/28/17 1455 01/28/17 1705 01/29/17 0527  NA 141  --  142  K 3.2*  --  4.0  CL 106  --  103  CO2 25  --  23  GLUCOSE 105*  --  70  BUN 18  --  14  CREATININE 1.19*  --  1.17*  CALCIUM 9.1  --  9.3  MG  --  2.0  --   AST 29  --   --   ALT 20  --   --   ALKPHOS 220*  --   --   BILITOT 2.6*  --   --    ------------------------------------------------------------------------------------------------------------------ estimated creatinine clearance is 55.2 mL/min (A) (by C-G formula based on SCr of 1.17 mg/dL (H)). ------------------------------------------------------------------------------------------------------------------ No results for input(s): HGBA1C in the last 72 hours. ------------------------------------------------------------------------------------------------------------------ No results for input(s): CHOL, HDL, LDLCALC, TRIG, CHOLHDL, LDLDIRECT in the last 72 hours. ------------------------------------------------------------------------------------------------------------------ No results for input(s): TSH, T4TOTAL, T3FREE, THYROIDAB in the last 72 hours.  Invalid input(s):  FREET3 ------------------------------------------------------------------------------------------------------------------ No results for input(s): VITAMINB12, FOLATE, FERRITIN, TIBC, IRON, RETICCTPCT in the last 72 hours.  Coagulation profile No results for input(s): INR, PROTIME in the last 168 hours.  No results for input(s): DDIMER in the last 72 hours.  Cardiac Enzymes  Recent Labs Lab 01/28/17 1705  TROPONINI <0.03   ------------------------------------------------------------------------------------------------------------------ Invalid input(s): POCBNP   CBG: No results for input(s): GLUCAP in the last 168 hours.     Studies: Dg Chest 2 View  Result Date: 01/28/2017 CLINICAL DATA:  Left leg swelling for 3 days EXAM: CHEST  2 VIEW COMPARISON:  06/26/2016 FINDINGS: There is cardiomegaly with mild central congestion. No large effusion or focal consolidation. No pneumothorax. IMPRESSION: Cardiomegaly with slight central congestion. No acute infiltrate or edema. Electronically Signed   By: Donavan Foil M.D.   On: 01/28/2017 19:02      Lab Results  Component Value Date   HGBA1C 5.8 (H) 05/04/2014   Lab Results  Component Value Date   LDLCALC  142 (H) 03/03/2016   CREATININE 1.17 (H) 01/29/2017       Scheduled Meds: . atorvastatin  80 mg Oral q1800  . carvedilol  3.125 mg Oral BID WC  . enoxaparin (LOVENOX) injection  40 mg Subcutaneous Q24H  . [START ON 02/02/2017] ferrous sulfate  325 mg Oral Once per day on Tue Thu  . furosemide  80 mg Intravenous BID  . lisinopril  5 mg Oral Daily  . potassium chloride  40 mEq Oral BID  . ticagrelor  60 mg Oral BID   Continuous Infusions:   LOS: 1 day    Time spent: >30 MINS    [facility]  [facility] Pager [phone]. If 7PM-7AM, please contact night-coverage at [email or web address], password [facility] 01/29/2017, 8:29 AM  LOS: 1 day

## 2017-01-29 NOTE — Plan of Care (Signed)
Problem: Skin Integrity: Goal: Risk for impaired skin integrity will decrease Outcome: Completed/Met Date Met: 01/29/17 Una boot ordered for left lower extremity  Problem: Tissue Perfusion: Goal: Risk factors for ineffective tissue perfusion will decrease Outcome: Progressing Receiving IV antibiotics

## 2017-01-30 DIAGNOSIS — I5023 Acute on chronic systolic (congestive) heart failure: Secondary | ICD-10-CM

## 2017-01-30 LAB — COMPREHENSIVE METABOLIC PANEL
ALT: 17 U/L (ref 14–54)
AST: 26 U/L (ref 15–41)
Albumin: 3.4 g/dL — ABNORMAL LOW (ref 3.5–5.0)
Alkaline Phosphatase: 222 U/L — ABNORMAL HIGH (ref 38–126)
Anion gap: 11 (ref 5–15)
BUN: 20 mg/dL (ref 6–20)
CO2: 28 mmol/L (ref 22–32)
Calcium: 9.5 mg/dL (ref 8.9–10.3)
Chloride: 99 mmol/L — ABNORMAL LOW (ref 101–111)
Creatinine, Ser: 1.35 mg/dL — ABNORMAL HIGH (ref 0.44–1.00)
GFR calc Af Amer: 47 mL/min — ABNORMAL LOW (ref >60)
GFR calc non Af Amer: 40 mL/min — ABNORMAL LOW (ref >60)
Glucose, Bld: 130 mg/dL — ABNORMAL HIGH (ref 65–99)
Potassium: 4.2 mmol/L (ref 3.5–5.1)
Sodium: 138 mmol/L (ref 135–145)
Total Bilirubin: 1.5 mg/dL — ABNORMAL HIGH (ref 0.3–1.2)
Total Protein: 6.6 g/dL (ref 6.5–8.1)

## 2017-01-30 LAB — CBC
HCT: 45.1 % (ref 36.0–46.0)
Hemoglobin: 14.1 g/dL (ref 12.0–15.0)
MCH: 27.9 pg (ref 26.0–34.0)
MCHC: 31.3 g/dL (ref 30.0–36.0)
MCV: 89.3 fL (ref 78.0–100.0)
Platelets: 287 10*3/uL (ref 150–400)
RBC: 5.05 MIL/uL (ref 3.87–5.11)
RDW: 15.3 % (ref 11.5–15.5)
WBC: 10.9 10*3/uL — ABNORMAL HIGH (ref 4.0–10.5)

## 2017-01-30 MED ORDER — SPIRONOLACTONE 25 MG PO TABS
12.5000 mg | ORAL_TABLET | Freq: Every day | ORAL | Status: DC
  Administered 2017-01-30 – 2017-02-02 (×4): 12.5 mg via ORAL
  Filled 2017-01-30 (×4): qty 1

## 2017-01-30 NOTE — Progress Notes (Signed)
[facility] PROGRESS NOTE  Delise Hamidi DGL:[id number] DOB: Jun 01, 1951 DOA: 01/28/2017   PCP: Grayce Sessions, NP     Assessment/Plan: Principal Problem:   Acute on chronic systolic and diastolic heart failure, NYHA class 1 (HCC) Active Problems:   Elevated LFTs   Coronary atherosclerosis of native coronary artery   Essential hypertension   CKD (chronic kidney disease), stage III   Hypokalemia   Deborah Ho is a 66 y.o. female with a past medical history significant for CHF EF 15%, medical noncompliance, HTN, and CAD s/p PCI/STEMI who presents with gradual leg swelling and pain and weeping.She was given furosemide 80 mg IV, empiric cefazolin,  TRH were asked to evaluate for CHF flare ?cellulitis  Assessment and plan 1. Acute on chronic systolic and diastolic CHF: history of chronic systolic CHF last EF was 15% in April 2017. Leg swelling bilaterally, weeping, poor medication knowledge, suspected noncompliance, unclear if her PCP stopped her BB, ACEi, spiro, hydral/nitrates, etc or if she just stopped them.     Increase IV diuresis to 80mg  Lasix TID, status post 2.5 mg of metolazone Last weight in clinic (07/2016) was 192 pounds, up to 230 pounds at admission. 4475 cc of urine output overnight  Creatinine slightly up today Appreciate cardiology recommendations Continue Coreg Restart Entresto ? Currently on lisinopril   2. Leg lower extremity edema/cellulitis This is I think purely from #1. Negative vascular ultrasound. Given cefazolin, which will be continued for left lower extremity edema, possible cellulitis -Elevate leg when in bed  3. Hypokalemia:  Mild.Potassium repleted.4.0, magnesium 2.0   4. Elevated LFTs:  Suspect congestion.  Present before.  -Diurese and trend LFTs  5. Hypertension:  Continue lisinopril, Coreg    6. Coronary artery disease: DES to LAD in 04/2014 with readmission the following week for acute in-stent  thrombosis -Continue statin and Brilinta  7. chronic CKD stage III: Baseline creatinine is 1.5-1.6 - Creatinine 1.17 today - Follow with daily BMET   DVT prophylaxsis Lovenox  Code Status:  Full code    Family Communication: Discussed in detail with the patient, all imaging results, lab results explained to the patient   Disposition Plan:  Cardiology consult pending      Consultants:  Cardiology  Procedures: None  Antibiotics: Anti-infectives    Start     Dose/Rate Route Frequency Ordered Stop   01/29/17 1400  ceFAZolin (ANCEF) IVPB 1 g/50 mL premix     1 g 100 mL/hr over 30 Minutes Intravenous Every 8 hours 01/29/17 1159     01/28/17 1715  ceFAZolin (ANCEF) IVPB 1 g/50 mL premix     1 g 100 mL/hr over 30 Minutes Intravenous  Once 01/28/17 1707 01/28/17 1814         HPI/Subjective: Denies dyspnea on session, main complaint is bilateral lower extremity edema  Objective: Vitals:   01/29/17 0751 01/29/17 1238 01/29/17 1931 01/30/17 0403  BP: (!) 148/89 134/86 (!) 146/92 (!) 144/91  Pulse: 89 90 92 89  Resp:  18 18 20   Temp: 97.9 F (36.6 C)  97.9 F (36.6 C) 97.5 F (36.4 C)  TempSrc: Axillary  Oral Oral  SpO2: 99% 100% 100% 100%  Weight:    101.2 kg (223 lb)  Height:        Intake/Output Summary (Last 24 hours) at 01/30/17 0746 Last data filed at 01/30/17 5188  Gross per 24 hour  Intake  870 ml  Output             4225 ml  Net            -3355 ml    Exam:  Examination:  General exam: Appears calm and comfortable  Respiratory system: Clear to auscultation. Respiratory effort normal. Cardiovascular system: S1 & S2 heard, RRR. No JVD, murmurs, rubs, gallops or clicks. 2+ pitting Gastrointestinal system: Abdomen is nondistended, soft and nontender. No organomegaly or masses felt. Normal bowel sounds heard. Central nervous system: Alert and oriented. No focal neurological deficits. Extremities: Symmetric 5 x 5 power.Erythema and  induration in the left lower extremity Skin: No rashes, lesions or ulcers Psychiatry: Judgement and insight appear normal. Mood & affect appropriate.     Data Reviewed: I have personally reviewed following labs and imaging studies  Micro Results No results found for this or any previous visit (from the past 240 hour(s)).  Radiology Reports Dg Chest 2 View  Result Date: 01/28/2017 CLINICAL DATA:  Left leg swelling for 3 days EXAM: CHEST  2 VIEW COMPARISON:  06/26/2016 FINDINGS: There is cardiomegaly with mild central congestion. No large effusion or focal consolidation. No pneumothorax. IMPRESSION: Cardiomegaly with slight central congestion. No acute infiltrate or edema. Electronically Signed   By: Jasmine Pang M.D.   On: 01/28/2017 19:02     CBC  Recent Labs Lab 01/28/17 1455 01/29/17 0527 01/30/17 0446  WBC 9.8 11.6* 10.9*  HGB 15.2* 14.6 14.1  HCT 48.4* 46.8* 45.1  PLT 292 291 287  MCV 90.6 90.5 89.3  MCH 28.5 28.2 27.9  MCHC 31.4 31.2 31.3  RDW 15.2 15.4 15.3  LYMPHSABS 1.6  --   --   MONOABS 0.7  --   --   EOSABS 0.1  --   --   BASOSABS 0.0  --   --     Chemistries   Recent Labs Lab 01/28/17 1455 01/28/17 1705 01/29/17 0527 01/30/17 0446  NA 141  --  142 138  K 3.2*  --  4.0 4.2  CL 106  --  103 99*  CO2 25  --  23 28  GLUCOSE 105*  --  70 130*  BUN 18  --  14 20  CREATININE 1.19*  --  1.17* 1.35*  CALCIUM 9.1  --  9.3 9.5  MG  --  2.0  --   --   AST 29  --   --  26  ALT 20  --   --  17  ALKPHOS 220*  --   --  222*  BILITOT 2.6*  --   --  1.5*   ------------------------------------------------------------------------------------------------------------------ estimated creatinine clearance is 47.2 mL/min (A) (by C-G formula based on SCr of 1.35 mg/dL (H)). ------------------------------------------------------------------------------------------------------------------ No results for input(s): HGBA1C in the last 72  hours. ------------------------------------------------------------------------------------------------------------------ No results for input(s): CHOL, HDL, LDLCALC, TRIG, CHOLHDL, LDLDIRECT in the last 72 hours. ------------------------------------------------------------------------------------------------------------------ No results for input(s): TSH, T4TOTAL, T3FREE, THYROIDAB in the last 72 hours.  Invalid input(s): FREET3 ------------------------------------------------------------------------------------------------------------------ No results for input(s): VITAMINB12, FOLATE, FERRITIN, TIBC, IRON, RETICCTPCT in the last 72 hours.  Coagulation profile No results for input(s): INR, PROTIME in the last 168 hours.  No results for input(s): DDIMER in the last 72 hours.  Cardiac Enzymes  Recent Labs Lab 01/28/17 1705  TROPONINI <0.03   ------------------------------------------------------------------------------------------------------------------ Invalid input(s): POCBNP   CBG: No results for input(s): GLUCAP in the last 168 hours.     Studies: Dg Chest 2 View  Result Date: 01/28/2017 CLINICAL DATA:  Left leg swelling for 3 days EXAM: CHEST  2 VIEW COMPARISON:  06/26/2016 FINDINGS: There is cardiomegaly with mild central congestion. No large effusion or focal consolidation. No pneumothorax. IMPRESSION: Cardiomegaly with slight central congestion. No acute infiltrate or edema. Electronically Signed   By: Jasmine Pang M.D.   On: 01/28/2017 19:02      Lab Results  Component Value Date   HGBA1C 5.8 (H) 05/04/2014   Lab Results  Component Value Date   LDLCALC 142 (H) 03/03/2016   CREATININE 1.35 (H) 01/30/2017       Scheduled Meds: . atorvastatin  80 mg Oral q1800  . carvedilol  3.125 mg Oral BID WC  .  ceFAZolin (ANCEF) IV  1 g Intravenous Q8H  . enoxaparin (LOVENOX) injection  40 mg Subcutaneous Q24H  . [START ON 02/02/2017] ferrous sulfate  325 mg Oral  Once per day on Tue Thu  . furosemide  80 mg Intravenous Q8H  . lisinopril  5 mg Oral Daily  . potassium chloride  40 mEq Oral BID  . ticagrelor  60 mg Oral BID   Continuous Infusions:   LOS: 2 days    Time spent: >30 MINS    [facility]  [facility] Pager [phone]. If 7PM-7AM, please contact night-coverage at [email or web address], password [facility] 01/30/2017, 7:46 AM  LOS: 2 days

## 2017-01-30 NOTE — Progress Notes (Signed)
Orthopedic Tech Progress Note Patient Details:  Deborah Ho 12/02/1950 [id number]  Ortho Devices Type of Ortho Device: Radio broadcast assistant Ortho Device/Splint Location: (B) LE Ortho Device/Splint Interventions: Ordered, Application   Jennye Moccasin 01/30/2017, 4:06 PM

## 2017-01-30 NOTE — Progress Notes (Addendum)
Patient ID: Deborah Ho, female   DOB: 1951-06-15, 66 y.o.   MRN: [id number]   SUBJECTIVE: She diuresed well yesterday, weight down 6 lbs.  Has not been moving around much but denies dyspnea.   Scheduled Meds: . atorvastatin  80 mg Oral q1800  . carvedilol  3.125 mg Oral BID WC  .  ceFAZolin (ANCEF) IV  1 g Intravenous Q8H  . enoxaparin (LOVENOX) injection  40 mg Subcutaneous Q24H  . [START ON 02/02/2017] ferrous sulfate  325 mg Oral Once per day on Tue Thu  . furosemide  80 mg Intravenous Q8H  . lisinopril  5 mg Oral Daily  . potassium chloride  40 mEq Oral BID  . spironolactone  12.5 mg Oral Daily  . ticagrelor  60 mg Oral BID   Continuous Infusions: PRN Meds:.acetaminophen **OR** acetaminophen, morphine injection, traMADol    Vitals:   01/29/17 1931 01/30/17 0403 01/30/17 0840 01/30/17 1004  BP: (!) 146/92 (!) 144/91  132/85  Pulse: 92 89 85 82  Resp: 18 20    Temp: 97.9 F (36.6 C) 97.5 F (36.4 C)    TempSrc: Oral Oral    SpO2: 100% 100%    Weight:  223 lb (101.2 kg)    Height:        Intake/Output Summary (Last 24 hours) at 01/30/17 1222 Last data filed at 01/30/17 1040  Gross per 24 hour  Intake             1350 ml  Output             4925 ml  Net            -3575 ml    LABS: Basic Metabolic Panel:  Recent Labs  27/74/12 1705 01/29/17 0527 01/30/17 0446  NA  --  142 138  K  --  4.0 4.2  CL  --  103 99*  CO2  --  23 28  GLUCOSE  --  70 130*  BUN  --  14 20  CREATININE  --  1.17* 1.35*  CALCIUM  --  9.3 9.5  MG 2.0  --   --    Liver Function Tests:  Recent Labs  01/28/17 1455 01/30/17 0446  AST 29 26  ALT 20 17  ALKPHOS 220* 222*  BILITOT 2.6* 1.5*  PROT 6.6 6.6  ALBUMIN 3.4* 3.4*   No results for input(s): LIPASE, AMYLASE in the last 72 hours. CBC:  Recent Labs  01/28/17 1455 01/29/17 0527 01/30/17 0446  WBC 9.8 11.6* 10.9*  NEUTROABS 7.4  --   --   HGB 15.2* 14.6 14.1  HCT 48.4* 46.8* 45.1  MCV 90.6 90.5 89.3  PLT 292 291  287   Cardiac Enzymes:  Recent Labs  01/28/17 1705  TROPONINI <0.03   BNP: Invalid input(s): POCBNP D-Dimer: No results for input(s): DDIMER in the last 72 hours. Hemoglobin A1C: No results for input(s): HGBA1C in the last 72 hours. Fasting Lipid Panel: No results for input(s): CHOL, HDL, LDLCALC, TRIG, CHOLHDL, LDLDIRECT in the last 72 hours. Thyroid Function Tests: No results for input(s): TSH, T4TOTAL, T3FREE, THYROIDAB in the last 72 hours.  Invalid input(s): FREET3 Anemia Panel: No results for input(s): VITAMINB12, FOLATE, FERRITIN, TIBC, IRON, RETICCTPCT in the last 72 hours.  RADIOLOGY: Dg Chest 2 View  Result Date: 01/28/2017 CLINICAL DATA:  Left leg swelling for 3 days EXAM: CHEST  2 VIEW COMPARISON:  06/26/2016 FINDINGS: There is cardiomegaly with mild central congestion. No large  effusion or focal consolidation. No pneumothorax. IMPRESSION: Cardiomegaly with slight central congestion. No acute infiltrate or edema. Electronically Signed   By: Jasmine Pang M.D.   On: 01/28/2017 19:02    PHYSICAL EXAM General: NAD Neck: JVP 16 cm, no thyromegaly or thyroid nodule.  Lungs: Clear to auscultation bilaterally with normal respiratory effort. CV: Nondisplaced PMI.  Heart regular S1/S2, no S3/S4, no murmur.  2+ edema to thighs.   Abdomen: Soft, nontender, no hepatosplenomegaly, mild distention.  Neurologic: Alert and oriented x 3.  Psych: Normal affect. Extremities: No clubbing or cyanosis. Lower legs wrapped.   TELEMETRY: Personally reviewed telemetry pt in NSR  ASSESSMENT AND PLAN: 66 yo with history of CAD s/p anterior MI, ischemic cardiomyopathy/chronic systolic CHF, and noncompliance with followup presented with acute on chronic systolic CHF, marked volume overload.  1. Acute on chronic systolic CHF: She has been noncompliant with followup but says that she has been taking her meds. Last echo in 4/17 showed EF 15%, moderately reduced RV systolic function.  She is  markedly volume overloaded but has diuresed well so far.  - Continue Lasix 80 mg IV every 8 hrs.  - Would stop lisinopril, in 36 hrs will start her on Entresto 24/26 bid. She says that she felt better with [facility] and wants to try it again.  - Continue low dose Coreg, add spironolactone 12.5 daily.  2. CKD: Stage II-III.  Mild increase in creatinine with diuresis, follow.  3. CAD: Anterior STEMI in 6/15 with PCI, did not take ticagrelor and had stent thrombosis shortly afterwards.  No chest pain.  - Continue ticagrelor,and statin, allergic to aspirin.   4. Noncompliance: Will continue to work on getting her in for followup.   [name] 01/30/2017 12:33 PM

## 2017-01-31 LAB — COMPREHENSIVE METABOLIC PANEL
ALT: 14 U/L (ref 14–54)
AST: 26 U/L (ref 15–41)
Albumin: 3.1 g/dL — ABNORMAL LOW (ref 3.5–5.0)
Alkaline Phosphatase: 218 U/L — ABNORMAL HIGH (ref 38–126)
Anion gap: 11 (ref 5–15)
BUN: 24 mg/dL — ABNORMAL HIGH (ref 6–20)
CO2: 32 mmol/L (ref 22–32)
Calcium: 9.2 mg/dL (ref 8.9–10.3)
Chloride: 93 mmol/L — ABNORMAL LOW (ref 101–111)
Creatinine, Ser: 1.43 mg/dL — ABNORMAL HIGH (ref 0.44–1.00)
GFR calc Af Amer: 43 mL/min — ABNORMAL LOW (ref >60)
GFR calc non Af Amer: 38 mL/min — ABNORMAL LOW (ref >60)
Glucose, Bld: 92 mg/dL (ref 65–99)
Potassium: 4.1 mmol/L (ref 3.5–5.1)
Sodium: 136 mmol/L (ref 135–145)
Total Bilirubin: 1.5 mg/dL — ABNORMAL HIGH (ref 0.3–1.2)
Total Protein: 6.4 g/dL — ABNORMAL LOW (ref 6.5–8.1)

## 2017-01-31 LAB — CBC
HCT: 44.6 % (ref 36.0–46.0)
Hemoglobin: 14 g/dL (ref 12.0–15.0)
MCH: 28 pg (ref 26.0–34.0)
MCHC: 31.4 g/dL (ref 30.0–36.0)
MCV: 89.2 fL (ref 78.0–100.0)
Platelets: 306 10*3/uL (ref 150–400)
RBC: 5 MIL/uL (ref 3.87–5.11)
RDW: 15.2 % (ref 11.5–15.5)
WBC: 10.2 10*3/uL (ref 4.0–10.5)

## 2017-01-31 MED ORDER — LORATADINE 10 MG PO TABS
10.0000 mg | ORAL_TABLET | Freq: Every day | ORAL | Status: DC
  Administered 2017-01-31: 10 mg via ORAL
  Filled 2017-01-31 (×2): qty 1

## 2017-01-31 NOTE — Progress Notes (Addendum)
Pt. Did not want to elevate legs today despite continuous educations about HF and fluid retention. Pt complaining legs are tighter this afternoon. Encouraged her to elevate Bilat extremites

## 2017-01-31 NOTE — Progress Notes (Signed)
[facility] PROGRESS NOTE  Deborah Ho GPQ:[id number] DOB: 18-May-1951 DOA: 01/28/2017   PCP: Grayce Sessions, NP     Assessment/Plan: Principal Problem:   Acute on chronic systolic and diastolic heart failure, NYHA class 1 (HCC) Active Problems:   Elevated LFTs   Coronary atherosclerosis of native coronary artery   Essential hypertension   CKD (chronic kidney disease), stage III   Hypokalemia   Deborah Ho is a 66 y.o. female with a past medical history significant for CHF EF 15%, medical noncompliance, HTN, and CAD s/p PCI/STEMI who presents with gradual leg swelling and pain and weeping.She was given furosemide 80 mg IV, empiric cefazolin,  TRH were asked to evaluate for CHF flare ?cellulitis  Assessment and plan 1. Acute on chronic systolic and diastolic CHF: history of chronic systolic CHF last EF was 15% in April 2017. Leg swelling bilaterally, weeping, poor medication knowledge, suspected noncompliance,  stopped her BB, ACEi, spiro, hydral/nitrates,   Continue Lasix 80 mg IV every 8 hrs.    Stopped  lisinopril, in 36 hrs start   Entresto 24/26 bid as per cardiology.   status post 2.5 mg of metolazone Last weight in clinic (07/2016) was 192 pounds, up to 230 pounds at admission. Now down to 219 pounds   Creatinine slightly up today 1.19>1.43 Appreciate cardiology recommendations Continue Coreg     2. Leg lower extremity edema/cellulitis This is I think purely from #1. Negative vascular ultrasound. Given cefazolin, which will be continued for left lower extremity edema, possible cellulitis -Elevate leg when in bed  3. Hypokalemia:  Mild.Potassium repleted.4.0, magnesium 2.0   4. Elevated LFTs:  Suspect congestion.  Present before.  -Diurese and trend LFTs  5. Hypertension:  Continue lisinopril, Coreg    6. Coronary artery disease: DES to LAD in 04/2014 with readmission the following week for acute in-stent thrombosis -Continue statin and  Brilinta  7. chronic CKD stage III: Baseline creatinine is 1.5-1.6 - Creatinine 1.43 today - Follow with daily BMET   DVT prophylaxsis Lovenox  Code Status:  Full code    Family Communication: Discussed in detail with the patient, all imaging results, lab results explained to the patient   Disposition Plan:   Continue diuresis today      Consultants:  Cardiology  Procedures: None  Antibiotics: Anti-infectives    Start     Dose/Rate Route Frequency Ordered Stop   01/29/17 1400  ceFAZolin (ANCEF) IVPB 1 g/50 mL premix     1 g 100 mL/hr over 30 Minutes Intravenous Every 8 hours 01/29/17 1159     01/28/17 1715  ceFAZolin (ANCEF) IVPB 1 g/50 mL premix     1 g 100 mL/hr over 30 Minutes Intravenous  Once 01/28/17 1707 01/28/17 1814         HPI/Subjective: Denies dyspnea on session, main complaint is bilateral lower extremity edema  Objective: Vitals:   01/30/17 1200 01/30/17 1636 01/30/17 2014 01/31/17 0519  BP: 123/72  124/75 117/66  Pulse: 87 88 87 (!) 1  Resp: 18  18 17   Temp: 97.4 F (36.3 C)  97.9 F (36.6 C) 97.8 F (36.6 C)  TempSrc: Oral  Oral Oral  SpO2: 100%  99% 99%  Weight:    99.4 kg (219 lb 1.6 oz)  Height:        Intake/Output Summary (Last 24 hours) at 01/31/17 02/02/17 Last data filed at 01/31/17 0700  Gross per 24 hour  Intake  1110 ml  Output             4150 ml  Net            -3040 ml    Exam:  Examination:  General exam: Appears calm and comfortable  Neck: JVP 16 cm, no thyromegaly or thyroid nodule.  Lungs: Clear to auscultation bilaterally with normal respiratory effort. CV: Nondisplaced PMI.  Heart regular S1/S2, no S3/S4, no murmur.  2+ edema to thighs.   Abdomen: Soft, nontender, no hepatosplenomegaly, mild distention.  Neurologic: Alert and oriented x 3.  Psych: Normal affect. Extremities: No clubbing or cyanosis. Lower legs wrapped.     Data Reviewed: I have personally reviewed following labs and imaging  studies  Micro Results No results found for this or any previous visit (from the past 240 hour(s)).  Radiology Reports Dg Chest 2 View  Result Date: 01/28/2017 CLINICAL DATA:  Left leg swelling for 3 days EXAM: CHEST  2 VIEW COMPARISON:  06/26/2016 FINDINGS: There is cardiomegaly with mild central congestion. No large effusion or focal consolidation. No pneumothorax. IMPRESSION: Cardiomegaly with slight central congestion. No acute infiltrate or edema. Electronically Signed   By: Deborah Ho M.D.   On: 01/28/2017 19:02     CBC  Recent Labs Lab 01/28/17 1455 01/29/17 0527 01/30/17 0446 01/31/17 0514  WBC 9.8 11.6* 10.9* 10.2  HGB 15.2* 14.6 14.1 14.0  HCT 48.4* 46.8* 45.1 44.6  PLT 292 291 287 306  MCV 90.6 90.5 89.3 89.2  MCH 28.5 28.2 27.9 28.0  MCHC 31.4 31.2 31.3 31.4  RDW 15.2 15.4 15.3 15.2  LYMPHSABS 1.6  --   --   --   MONOABS 0.7  --   --   --   EOSABS 0.1  --   --   --   BASOSABS 0.0  --   --   --     Chemistries   Recent Labs Lab 01/28/17 1455 01/28/17 1705 01/29/17 0527 01/30/17 0446 01/31/17 0514  NA 141  --  142 138 136  K 3.2*  --  4.0 4.2 4.1  CL 106  --  103 99* 93*  CO2 25  --  23 28 32  GLUCOSE 105*  --  70 130* 92  BUN 18  --  14 20 24*  CREATININE 1.19*  --  1.17* 1.35* 1.43*  CALCIUM 9.1  --  9.3 9.5 9.2  MG  --  2.0  --   --   --   AST 29  --   --  26 26  ALT 20  --   --  17 14  ALKPHOS 220*  --   --  222* 218*  BILITOT 2.6*  --   --  1.5* 1.5*   ------------------------------------------------------------------------------------------------------------------ estimated creatinine clearance is 44.1 mL/min (A) (by C-G formula based on SCr of 1.43 mg/dL (H)). ------------------------------------------------------------------------------------------------------------------ No results for input(s): HGBA1C in the last 72  hours. ------------------------------------------------------------------------------------------------------------------ No results for input(s): CHOL, HDL, LDLCALC, TRIG, CHOLHDL, LDLDIRECT in the last 72 hours. ------------------------------------------------------------------------------------------------------------------ No results for input(s): TSH, T4TOTAL, T3FREE, THYROIDAB in the last 72 hours.  Invalid input(s): FREET3 ------------------------------------------------------------------------------------------------------------------ No results for input(s): VITAMINB12, FOLATE, FERRITIN, TIBC, IRON, RETICCTPCT in the last 72 hours.  Coagulation profile No results for input(s): INR, PROTIME in the last 168 hours.  No results for input(s): DDIMER in the last 72 hours.  Cardiac Enzymes  Recent Labs Lab 01/28/17 1705  TROPONINI <0.03   ------------------------------------------------------------------------------------------------------------------ Invalid input(s): POCBNP  CBG: No results for input(s): GLUCAP in the last 168 hours.     Studies: No results found.    Lab Results  Component Value Date   HGBA1C 5.8 (H) 05/04/2014   Lab Results  Component Value Date   LDLCALC 142 (H) 03/03/2016   CREATININE 1.43 (H) 01/31/2017       Scheduled Meds: . atorvastatin  80 mg Oral q1800  . carvedilol  3.125 mg Oral BID WC  .  ceFAZolin (ANCEF) IV  1 g Intravenous Q8H  . enoxaparin (LOVENOX) injection  40 mg Subcutaneous Q24H  . [START ON 02/02/2017] ferrous sulfate  325 mg Oral Once per day on Tue Thu  . furosemide  80 mg Intravenous Q8H  . potassium chloride  40 mEq Oral BID  . spironolactone  12.5 mg Oral Daily  . ticagrelor  60 mg Oral BID   Continuous Infusions:   LOS: 3 days    Time spent: >30 MINS    [facility]  [facility] Pager [phone]. If 7PM-7AM, please contact night-coverage at [email or web address], password [facility] 01/31/2017, 8:52 AM  LOS:  3 days

## 2017-01-31 NOTE — Progress Notes (Signed)
Patient refused bed alarm. Will continue to monitor patient. 

## 2017-01-31 NOTE — Progress Notes (Signed)
Patient ID: Deborah Ho, female   DOB: 1951/09/24, 66 y.o.   MRN: [id number]   SUBJECTIVE: She diuresed well again yesterday, weight down 4 lbs.  No dyspnea.   Scheduled Meds: . atorvastatin  80 mg Oral q1800  . carvedilol  3.125 mg Oral BID WC  .  ceFAZolin (ANCEF) IV  1 g Intravenous Q8H  . enoxaparin (LOVENOX) injection  40 mg Subcutaneous Q24H  . [START ON 02/02/2017] ferrous sulfate  325 mg Oral Once per day on Tue Thu  . furosemide  80 mg Intravenous Q8H  . loratadine  10 mg Oral Daily  . potassium chloride  40 mEq Oral BID  . spironolactone  12.5 mg Oral Daily  . ticagrelor  60 mg Oral BID   Continuous Infusions: PRN Meds:.acetaminophen **OR** acetaminophen, morphine injection, traMADol    Vitals:   01/30/17 1200 01/30/17 1636 01/30/17 2014 01/31/17 0519  BP: 123/72  124/75 117/66  Pulse: 87 88 87 (!) 1  Resp: 18  18 17   Temp: 97.4 F (36.3 C)  97.9 F (36.6 C) 97.8 F (36.6 C)  TempSrc: Oral  Oral Oral  SpO2: 100%  99% 99%  Weight:    219 lb 1.6 oz (99.4 kg)  Height:        Intake/Output Summary (Last 24 hours) at 01/31/17 1057 Last data filed at 01/31/17 0700  Gross per 24 hour  Intake              390 ml  Output             3150 ml  Net            -2760 ml    LABS: Basic Metabolic Panel:  Recent Labs  02/02/17 1705  01/30/17 0446 01/31/17 0514  NA  --   < > 138 136  K  --   < > 4.2 4.1  CL  --   < > 99* 93*  CO2  --   < > 28 32  GLUCOSE  --   < > 130* 92  BUN  --   < > 20 24*  CREATININE  --   < > 1.35* 1.43*  CALCIUM  --   < > 9.5 9.2  MG 2.0  --   --   --   < > = values in this interval not displayed. Liver Function Tests:  Recent Labs  01/30/17 0446 01/31/17 0514  AST 26 26  ALT 17 14  ALKPHOS 222* 218*  BILITOT 1.5* 1.5*  PROT 6.6 6.4*  ALBUMIN 3.4* 3.1*   No results for input(s): LIPASE, AMYLASE in the last 72 hours. CBC:  Recent Labs  01/28/17 1455  01/30/17 0446 01/31/17 0514  WBC 9.8  < > 10.9* 10.2  NEUTROABS 7.4   --   --   --   HGB 15.2*  < > 14.1 14.0  HCT 48.4*  < > 45.1 44.6  MCV 90.6  < > 89.3 89.2  PLT 292  < > 287 306  < > = values in this interval not displayed. Cardiac Enzymes:  Recent Labs  01/28/17 1705  TROPONINI <0.03   BNP: Invalid input(s): POCBNP D-Dimer: No results for input(s): DDIMER in the last 72 hours. Hemoglobin A1C: No results for input(s): HGBA1C in the last 72 hours. Fasting Lipid Panel: No results for input(s): CHOL, HDL, LDLCALC, TRIG, CHOLHDL, LDLDIRECT in the last 72 hours. Thyroid Function Tests: No results for input(s): TSH, T4TOTAL, T3FREE, THYROIDAB in  the last 72 hours.  Invalid input(s): FREET3 Anemia Panel: No results for input(s): VITAMINB12, FOLATE, FERRITIN, TIBC, IRON, RETICCTPCT in the last 72 hours.  RADIOLOGY: Dg Chest 2 View  Result Date: 01/28/2017 CLINICAL DATA:  Left leg swelling for 3 days EXAM: CHEST  2 VIEW COMPARISON:  06/26/2016 FINDINGS: There is cardiomegaly with mild central congestion. No large effusion or focal consolidation. No pneumothorax. IMPRESSION: Cardiomegaly with slight central congestion. No acute infiltrate or edema. Electronically Signed   By: Jasmine Pang M.D.   On: 01/28/2017 19:02    PHYSICAL EXAM General: NAD Neck: JVP 14 cm, no thyromegaly or thyroid nodule.  Lungs: Slight crackles at bases bilaterally.  CV: Nondisplaced PMI.  Heart regular S1/S2, no S3/S4, no murmur.  2+ edema to thighs.   Abdomen: Soft, nontender, no hepatosplenomegaly, mild distention.  Neurologic: Alert and oriented x 3.  Psych: Normal affect. Extremities: No clubbing or cyanosis. Lower legs wrapped.   TELEMETRY: Personally reviewed telemetry pt in NSR  ASSESSMENT AND PLAN: 66 yo with history of CAD s/p anterior MI, ischemic cardiomyopathy/chronic systolic CHF, and noncompliance with followup presented with acute on chronic systolic CHF, marked volume overload.  1. Acute on chronic systolic CHF: She has been noncompliant with  followup but says that she has been taking her meds. Last echo in 4/17 showed EF 15%, moderately reduced RV systolic function.  She is markedly volume overloaded but has diuresed well so far.  Weight down another 4 lbs.  Needs more diuresis.  - Continue Lasix 80 mg IV every 8 hrs.  - Off lisinopril now, will start Entresto tomorrow after 36 hr washout if creatinine stable. - Continue low dose Coreg, add spironolactone 12.5 daily.  2. CKD: Stage II-III.  Mild increase in creatinine with diuresis, follow.  3. CAD: Anterior STEMI in 6/15 with PCI, did not take ticagrelor and had stent thrombosis shortly afterwards.  No chest pain.  - Continue ticagrelor,and statin, allergic to aspirin.   4. Noncompliance: Will continue to work on getting her in for followup.   [name] 01/31/2017 10:57 AM

## 2017-02-01 ENCOUNTER — Inpatient Hospital Stay (HOSPITAL_COMMUNITY): Payer: Medicare Other

## 2017-02-01 DIAGNOSIS — I517 Cardiomegaly: Secondary | ICD-10-CM

## 2017-02-01 LAB — BASIC METABOLIC PANEL
Anion gap: 9 (ref 5–15)
BUN: 24 mg/dL — ABNORMAL HIGH (ref 6–20)
CO2: 32 mmol/L (ref 22–32)
Calcium: 9.1 mg/dL (ref 8.9–10.3)
Chloride: 96 mmol/L — ABNORMAL LOW (ref 101–111)
Creatinine, Ser: 1.31 mg/dL — ABNORMAL HIGH (ref 0.44–1.00)
GFR calc Af Amer: 48 mL/min — ABNORMAL LOW (ref >60)
GFR calc non Af Amer: 42 mL/min — ABNORMAL LOW (ref >60)
Glucose, Bld: 100 mg/dL — ABNORMAL HIGH (ref 65–99)
Potassium: 3.7 mmol/L (ref 3.5–5.1)
Sodium: 137 mmol/L (ref 135–145)

## 2017-02-01 LAB — ECHOCARDIOGRAM COMPLETE
Height: 63 in
Weight: 3353.6 oz

## 2017-02-01 MED ORDER — PERFLUTREN LIPID MICROSPHERE
1.0000 mL | INTRAVENOUS | Status: AC | PRN
  Administered 2017-02-01: 4 mL via INTRAVENOUS
  Filled 2017-02-01: qty 10

## 2017-02-01 MED ORDER — SACUBITRIL-VALSARTAN 24-26 MG PO TABS
1.0000 | ORAL_TABLET | Freq: Two times a day (BID) | ORAL | Status: DC
  Administered 2017-02-01 – 2017-02-06 (×10): 1 via ORAL
  Filled 2017-02-01 (×11): qty 1

## 2017-02-01 MED ORDER — CETIRIZINE HCL 10 MG PO TABS
10.0000 mg | ORAL_TABLET | Freq: Every day | ORAL | Status: DC
  Administered 2017-02-01 – 2017-02-06 (×6): 10 mg via ORAL
  Filled 2017-02-01 (×6): qty 1

## 2017-02-01 MED ORDER — NON FORMULARY: 1.0000 | Freq: Every day | Status: DC

## 2017-02-01 MED ORDER — PERFLUTREN LIPID MICROSPHERE
INTRAVENOUS | Status: AC
  Administered 2017-02-01: 4 mL via INTRAVENOUS
  Filled 2017-02-01: qty 10

## 2017-02-01 NOTE — Progress Notes (Signed)
Patient ID: Deborah Ho, female   DOB: 1951/06/15, 66 y.o.   MRN: [id number]   SUBJECTIVE: Admitted 01/28/17 with marked volume overload. Started on IV diuresis - 80mg  Lasix q 8 hours, had one dose of metolazone 2.5mg . Weight down 21 pounds since admission. -10L total.   Lisinopril stopped on 01/30/17 in anticipation of starting Entresto.   Says she feels well, did feel dizzy yesterday, denies dizziness today. Denies orthopnea and PND.   Scheduled Meds: . atorvastatin  80 mg Oral q1800  . carvedilol  3.125 mg Oral BID WC  .  ceFAZolin (ANCEF) IV  1 g Intravenous Q8H  . enoxaparin (LOVENOX) injection  40 mg Subcutaneous Q24H  . [START ON 02/02/2017] ferrous sulfate  325 mg Oral Once per day on Tue Thu  . furosemide  80 mg Intravenous Q8H  . loratadine  10 mg Oral Daily  . potassium chloride  40 mEq Oral BID  . spironolactone  12.5 mg Oral Daily  . ticagrelor  60 mg Oral BID   Continuous Infusions: PRN Meds:.acetaminophen **OR** acetaminophen, morphine injection, traMADol    Vitals:   01/31/17 1700 01/31/17 1839 01/31/17 2025 02/01/17 0612  BP: (!) 87/63 121/65 124/68 118/82  Pulse: 88  88 84  Resp: 18  20 18   Temp: 97.8 F (36.6 C)  98.5 F (36.9 C) 98.9 F (37.2 C)  TempSrc: Oral  Oral Oral  SpO2: 99%  100% 98%  Weight:    209 lb 9.6 oz (95.1 kg)  Height:        Intake/Output Summary (Last 24 hours) at 02/01/17 0754 Last data filed at 02/01/17 0656  Gross per 24 hour  Intake              630 ml  Output             4250 ml  Net            -3620 ml    LABS: Basic Metabolic Panel:  Recent Labs  02/03/17 0514 02/01/17 0450  NA 136 137  K 4.1 3.7  CL 93* 96*  CO2 32 32  GLUCOSE 92 100*  BUN 24* 24*  CREATININE 1.43* 1.31*  CALCIUM 9.2 9.1   Liver Function Tests:  Recent Labs  01/30/17 0446 01/31/17 0514  AST 26 26  ALT 17 14  ALKPHOS 222* 218*  BILITOT 1.5* 1.5*  PROT 6.6 6.4*  ALBUMIN 3.4* 3.1*   CBC:  Recent Labs  01/30/17 0446  01/31/17 0514  WBC 10.9* 10.2  HGB 14.1 14.0  HCT 45.1 44.6  MCV 89.3 89.2  PLT 287 306    RADIOLOGY: Dg Chest 2 View  Result Date: 01/28/2017 CLINICAL DATA:  Left leg swelling for 3 days EXAM: CHEST  2 VIEW COMPARISON:  06/26/2016 FINDINGS: There is cardiomegaly with mild central congestion. No large effusion or focal consolidation. No pneumothorax. IMPRESSION: Cardiomegaly with slight central congestion. No acute infiltrate or edema. Electronically Signed   By: 01/30/2017 M.D.   On: 01/28/2017 19:02    PHYSICAL EXAM General: NAD, up and washing her face.  Neck: JVP 14 cm, supple.  Lungs: CTA in all lobes.  CV: PMI nondisplaced.  Heart regular S1/S2, no S3/S4, no murmur. 1+ thigh edema.  Abdomen: Soft, nontender, no hepatosplenomegaly, no distention Neurologic: Alert and oriented x 4.  Psych: Normal affect. Pleasant.  Extremities: No clubbing or cyanosis. Bilateral unna boots.   TELEMETRY: NSR - personally reviewed.   ASSESSMENT AND PLAN: 66 yo  with history of CAD s/p anterior MI, ischemic cardiomyopathy/chronic systolic CHF, and noncompliance with followup presented with acute on chronic systolic CHF, marked volume overload.  1. Acute on chronic systolic CHF, NYHA II: She has been noncompliant with followup but says that she has been taking her meds. Echo this admission shows EF 20-25%, moderately dilated RV with moderately decreased systolic function. Still with some volume - last discharge weight was 190 pounds.  - Continue IV diuresis with Lasix 80mg  q 8 hours. Will watch BP as we are starting Entreso, but she is still 19 pounds from her last discharge weight.  - Start Entresto 24/26mg  BID today. Will add parameters to hold if SBP is <85.  - Continue low dose Coreg, Spiro 12.5mg . Can up titrate once we see how her BP tolerates the initiation of Entresto.  2. CKD: Stage II-III.  Stable.  3. CAD: Anterior STEMI in 6/15 with PCI, did not take ticagrelor and had stent  thrombosis shortly afterwards.  No chest pain.  - Continue ticagrelor,and statin, allergic to aspirin.   4. Noncompliance: Continue to stress the importance of medication and dietary compliance.   7/15 02/01/2017 7:54 AM   Patient seen with NP, agree with the above note.  She is diuresing well but remains markedly volume dilated.  Start Mallory today.  Continue current IV Lasix.  Renal function stable.   [place] 02/01/2017 1:42 PM

## 2017-02-01 NOTE — Care Management Important Message (Signed)
Important Message  Patient Details  Name: Deborah Ho MRN: [id number] Date of Birth: 08/29/1951   Medicare Important Message Given:  Yes    [name] [name] 02/01/2017, 12:13 PM

## 2017-02-01 NOTE — Plan of Care (Signed)
Problem: Pain Managment: Goal: General experience of comfort will improve Outcome: Not Progressing Pt. Complains of BLE leg pain. Refuses PRN pain med until pain is intolerable.

## 2017-02-01 NOTE — Progress Notes (Signed)
[facility] PROGRESS NOTE  Deborah Ho WGN:[id number] DOB: [birth year] DOA: 01/28/2017   PCP: Grayce Sessions, NP     Assessment/Plan: Principal Problem:   Acute on chronic systolic and diastolic heart failure, NYHA class 1 (HCC) Active Problems:   Elevated LFTs   Coronary atherosclerosis of native coronary artery   Essential hypertension   CKD (chronic kidney disease), stage III   Hypokalemia   Deborah Ho is a 66 y.o. female with a past medical history significant for CHF EF 15%, medical noncompliance, HTN, and CAD s/p PCI/STEMI who presents with gradual leg swelling and pain and weeping.She was given furosemide 80 mg IV, empiric cefazolin,  TRH were asked to evaluate for CHF flare ?cellulitis  Assessment and plan 1. Acute on chronic systolic and diastolic CHF: history of chronic systolic CHF last EF was 15% in April 2017. Leg swelling bilaterally, weeping, poor medication knowledge, suspected noncompliance, she stopped her BB, ACEi, spiro, hydral/nitrates,  and noncompliance with followup  Currently on  Lasix 80 mg IV every 8 hrs.  Off lisinopril now, cards  started Entresto,aldactone  .   status post 2.5 mg of metolazone 3/17 Last weight in clinic (07/2016) was 192 pounds, up to 230 pounds at admission. Now down to 209 pounds   Creatinine slightly up today 1.19>1.43>1.31 Appreciate cardiology recommendations Continue Coreg     2. Leg lower extremity edema/cellulitis This is I think purely from #1. Negative vascular ultrasound. Given cefazolin, which will be continued for left lower extremity edema, possible cellulitis -Elevate leg when in bed  3. Hypokalemia:  Mild.Potassium repleted.4.0, magnesium 2.0   4. Elevated LFTs:resolved  Suspect congestion.  Present before.     5. Hypertension:  Continue  Coreg    6. Coronary artery disease: DES to LAD in 04/2014 with readmission the following week for acute in-stent thrombosis -Continue statin  and Brilinta  7. chronic CKD stage III: Baseline creatinine is 1.5-1.6 - Creatinine 1.43>1.31 today - Follow with daily BMET   DVT prophylaxsis Lovenox  Code Status:  Full code    Family Communication: Discussed in detail with the patient, all imaging results, lab results explained to the patient   Disposition Plan:   Continue diuresis today      Consultants:  Cardiology  Procedures: None  Antibiotics: Anti-infectives    Start     Dose/Rate Route Frequency Ordered Stop   01/29/17 1400  ceFAZolin (ANCEF) IVPB 1 g/50 mL premix     1 g 100 mL/hr over 30 Minutes Intravenous Every 8 hours 01/29/17 1159     01/28/17 1715  ceFAZolin (ANCEF) IVPB 1 g/50 mL premix     1 g 100 mL/hr over 30 Minutes Intravenous  Once 01/28/17 1707 01/28/17 1814         HPI/Subjective: Denies dyspnea, swelling of both legs improving  Objective: Vitals:   01/31/17 1700 01/31/17 1839 01/31/17 2025 02/01/17 0612  BP: (!) 87/63 121/65 124/68 118/82  Pulse: 88  88 84  Resp: 18  20 18   Temp: 97.8 F (36.6 C)  98.5 F (36.9 C) 98.9 F (37.2 C)  TempSrc: Oral  Oral Oral  SpO2: 99%  100% 98%  Weight:    95.1 kg (209 lb 9.6 oz)  Height:        Intake/Output Summary (Last 24 hours) at 02/01/17 0819 Last data filed at 02/01/17 0656  Gross per 24 hour  Intake  630 ml  Output             4250 ml  Net            -3620 ml    Exam:  Examination:  General exam: Appears calm and comfortable  Neck: JVP 16 cm, no thyromegaly or thyroid nodule.  Lungs: Clear to auscultation bilaterally with normal respiratory effort. CV: Nondisplaced PMI.  Heart regular S1/S2, no S3/S4, no murmur.  2+ edema to thighs.   Abdomen: Soft, nontender, no hepatosplenomegaly, mild distention.  Neurologic: Alert and oriented x 3.  Psych: Normal affect. Extremities: No clubbing or cyanosis. Lower legs wrapped.     Data Reviewed: I have personally reviewed following labs and imaging  studies  Micro Results No results found for this or any previous visit (from the past 240 hour(s)).  Radiology Reports Dg Chest 2 View  Result Date: 01/28/2017 CLINICAL DATA:  Left leg swelling for 3 days EXAM: CHEST  2 VIEW COMPARISON:  06/26/2016 FINDINGS: There is cardiomegaly with mild central congestion. No large effusion or focal consolidation. No pneumothorax. IMPRESSION: Cardiomegaly with slight central congestion. No acute infiltrate or edema. Electronically Signed   By: Jasmine Pang M.D.   On: 01/28/2017 19:02     CBC  Recent Labs Lab 01/28/17 1455 01/29/17 0527 01/30/17 0446 01/31/17 0514  WBC 9.8 11.6* 10.9* 10.2  HGB 15.2* 14.6 14.1 14.0  HCT 48.4* 46.8* 45.1 44.6  PLT 292 291 287 306  MCV 90.6 90.5 89.3 89.2  MCH 28.5 28.2 27.9 28.0  MCHC 31.4 31.2 31.3 31.4  RDW 15.2 15.4 15.3 15.2  LYMPHSABS 1.6  --   --   --   MONOABS 0.7  --   --   --   EOSABS 0.1  --   --   --   BASOSABS 0.0  --   --   --     Chemistries   Recent Labs Lab 01/28/17 1455 01/28/17 1705 01/29/17 0527 01/30/17 0446 01/31/17 0514 02/01/17 0450  NA 141  --  142 138 136 137  K 3.2*  --  4.0 4.2 4.1 3.7  CL 106  --  103 99* 93* 96*  CO2 25  --  23 28 32 32  GLUCOSE 105*  --  70 130* 92 100*  BUN 18  --  14 20 24* 24*  CREATININE 1.19*  --  1.17* 1.35* 1.43* 1.31*  CALCIUM 9.1  --  9.3 9.5 9.2 9.1  MG  --  2.0  --   --   --   --   AST 29  --   --  26 26  --   ALT 20  --   --  17 14  --   ALKPHOS 220*  --   --  222* 218*  --   BILITOT 2.6*  --   --  1.5* 1.5*  --    ------------------------------------------------------------------------------------------------------------------ estimated creatinine clearance is 47 mL/min (A) (by C-G formula based on SCr of 1.31 mg/dL (H)). ------------------------------------------------------------------------------------------------------------------ No results for input(s): HGBA1C in the last 72  hours. ------------------------------------------------------------------------------------------------------------------ No results for input(s): CHOL, HDL, LDLCALC, TRIG, CHOLHDL, LDLDIRECT in the last 72 hours. ------------------------------------------------------------------------------------------------------------------ No results for input(s): TSH, T4TOTAL, T3FREE, THYROIDAB in the last 72 hours.  Invalid input(s): FREET3 ------------------------------------------------------------------------------------------------------------------ No results for input(s): VITAMINB12, FOLATE, FERRITIN, TIBC, IRON, RETICCTPCT in the last 72 hours.  Coagulation profile No results for input(s): INR, PROTIME in the last 168 hours.  No results for  input(s): DDIMER in the last 72 hours.  Cardiac Enzymes  Recent Labs Lab 01/28/17 1705  TROPONINI <0.03   ------------------------------------------------------------------------------------------------------------------ Invalid input(s): POCBNP   CBG: No results for input(s): GLUCAP in the last 168 hours.     Studies: No results found.    Lab Results  Component Value Date   HGBA1C 5.8 (H) 05/04/2014   Lab Results  Component Value Date   LDLCALC 142 (H) 03/03/2016   CREATININE 1.31 (H) 02/01/2017       Scheduled Meds: . atorvastatin  80 mg Oral q1800  . carvedilol  3.125 mg Oral BID WC  .  ceFAZolin (ANCEF) IV  1 g Intravenous Q8H  . enoxaparin (LOVENOX) injection  40 mg Subcutaneous Q24H  . [START ON 02/02/2017] ferrous sulfate  325 mg Oral Once per day on Tue Thu  . furosemide  80 mg Intravenous Q8H  . loratadine  10 mg Oral Daily  . potassium chloride  40 mEq Oral BID  . spironolactone  12.5 mg Oral Daily  . ticagrelor  60 mg Oral BID   Continuous Infusions:   LOS: 4 days    Time spent: >30 MINS    [facility]  [facility] Pager [phone]. If 7PM-7AM, please contact night-coverage at [email or web address],  password [email or web address] 02/01/2017, 8:19 AM  LOS: 4 days

## 2017-02-01 NOTE — Progress Notes (Signed)
  Echocardiogram 2D Echocardiogram with definity has been performed.  Deborah Ho M 02/01/2017, 10:51 AM

## 2017-02-02 DIAGNOSIS — L03116 Cellulitis of left lower limb: Secondary | ICD-10-CM

## 2017-02-02 LAB — COMPREHENSIVE METABOLIC PANEL
ALT: 11 U/L — ABNORMAL LOW (ref 14–54)
AST: 22 U/L (ref 15–41)
Albumin: 2.9 g/dL — ABNORMAL LOW (ref 3.5–5.0)
Alkaline Phosphatase: 183 U/L — ABNORMAL HIGH (ref 38–126)
Anion gap: 11 (ref 5–15)
BUN: 23 mg/dL — ABNORMAL HIGH (ref 6–20)
CO2: 30 mmol/L (ref 22–32)
Calcium: 9 mg/dL (ref 8.9–10.3)
Chloride: 96 mmol/L — ABNORMAL LOW (ref 101–111)
Creatinine, Ser: 1.33 mg/dL — ABNORMAL HIGH (ref 0.44–1.00)
GFR calc Af Amer: 47 mL/min — ABNORMAL LOW (ref >60)
GFR calc non Af Amer: 41 mL/min — ABNORMAL LOW (ref >60)
Glucose, Bld: 102 mg/dL — ABNORMAL HIGH (ref 65–99)
Potassium: 4.1 mmol/L (ref 3.5–5.1)
Sodium: 137 mmol/L (ref 135–145)
Total Bilirubin: 1.2 mg/dL (ref 0.3–1.2)
Total Protein: 6.1 g/dL — ABNORMAL LOW (ref 6.5–8.1)

## 2017-02-02 MED ORDER — SPIRONOLACTONE 25 MG PO TABS
25.0000 mg | ORAL_TABLET | Freq: Every day | ORAL | Status: DC
  Administered 2017-02-03 – 2017-02-06 (×4): 25 mg via ORAL
  Filled 2017-02-02 (×4): qty 1

## 2017-02-02 MED ORDER — CARVEDILOL 3.125 MG PO TABS
3.1250 mg | ORAL_TABLET | Freq: Two times a day (BID) | ORAL | Status: DC
  Administered 2017-02-03 – 2017-02-04 (×3): 3.125 mg via ORAL
  Filled 2017-02-02 (×3): qty 1

## 2017-02-02 NOTE — Progress Notes (Signed)
Clarified with patient  Who told her not to take spironolactone, she said "Its Dr. Graciela Husbands my foot doctor  , oh its Dr. Shirlee Latch who came yesterday".

## 2017-02-02 NOTE — Progress Notes (Signed)
Patient refused  To take her Spironolactone, verbalized "my BP is so low, now I feel sick and that's what Dr. Marikay Alar told me yesterday not to take it". This Rn explained that it was not discontinued from the order.Patient said "I will not take it.

## 2017-02-02 NOTE — Progress Notes (Addendum)
[facility] PROGRESS NOTE  Deborah Ho ASN:[id number] DOB: [birth year] DOA: 01/28/2017   PCP: Grayce Sessions, NP     Assessment/Plan: Principal Problem:   Acute on chronic systolic and diastolic heart failure, NYHA class 1 (HCC) Active Problems:   Elevated LFTs   Coronary atherosclerosis of native coronary artery   Essential hypertension   CKD (chronic kidney disease), stage III   Hypokalemia   Cellulitis of left lower leg   Deborah Ho is a 66 y.o. female with a past medical history significant for CHF EF 15%, medical noncompliance, HTN, and CAD s/p PCI/STEMI who presents with gradual leg swelling and pain and weeping.admitted for cellulitis of the left lower extremity and CHF exacerbation.   Assessment and plan 1. Acute on chronic systolic and diastolic CHF: history of chronic systolic CHF last EF was 15% in April 2017.Echo this admission shows EF 20-25%, moderately dilated RV with moderately decreased systolic function Leg swelling bilaterally, weeping, poor medication knowledge, poor insight,  noncompliance, refusing medications even in the hospital, she stopped outpatient BB, ACEi, spiro, hydral/nitrates,  and noncompliant with cardiology follow-ups Currently diuretics per cardiology Off lisinopril now, cards  started Entresto,aldactone, Coreg  . Renal function stable Last weight in clinic (07/2016) was 192 pounds, up to 230 pounds at admission. Now down to 199 pounds  Creatinine slightly up today 1.19>1.43>1.31>1.33 Appreciate cardiology recommendations      2. Leg lower extremity edema/cellulitis This is I think purely from #1. Negative vascular ultrasound. On cefazolin, which will be continued for left lower extremity edema, possible cellulitis -Elevate leg when in bed Wants her UNNA boot to be removed   3. Hypokalemia:  Mild.Potassium repleted.4.0, magnesium 2.0   4. Elevated LFTs:resolved  Suspect congestion.  Present before.     5.  Hypertension:  Continue  Coreg    6. Coronary artery disease: DES to LAD in 04/2014 with readmission the following week for acute in-stent thrombosis -Continue statin and Brilinta  7. chronic CKD stage III:  Baseline creatinine is 1.5-1.6 -- Follow with daily BMET   DVT prophylaxsis Lovenox  Code Status:  Full code    Family Communication: Discussed in detail with the patient, all imaging results, lab results explained to the patient   Disposition Plan:  Plan as per cardiology recommendations      Consultants:  Cardiology  Procedures: None  Antibiotics: Anti-infectives    Start     Dose/Rate Route Frequency Ordered Stop   01/29/17 1400  ceFAZolin (ANCEF) IVPB 1 g/50 mL premix     1 g 100 mL/hr over 30 Minutes Intravenous Every 8 hours 01/29/17 1159     01/28/17 1715  ceFAZolin (ANCEF) IVPB 1 g/50 mL premix     1 g 100 mL/hr over 30 Minutes Intravenous  Once 01/28/17 1707 01/28/17 1814         HPI/Subjective: Complaining of left calf pain , wants her UNNA boot off   Objective: Vitals:   02/01/17 2209 02/02/17 0627 02/02/17 0629 02/02/17 0950  BP: 118/64 136/73  109/71  Pulse: 88 92  96  Resp: 16 17  16   Temp: 98.7 F (37.1 C) 97.8 F (36.6 C)  97.8 F (36.6 C)  TempSrc: Oral Oral  Oral  SpO2: 98% 99%  96%  Weight:   90.6 kg (199 lb 12.8 oz)   Height:        Intake/Output Summary (Last 24 hours) at 02/02/17 1016 Last data filed at 02/02/17 02/04/17  Gross per 24 hour  Intake              240 ml  Output             3600 ml  Net            -3360 ml    Exam:  Examination:  General exam: Appears calm and comfortable  Neck: JVP 16 cm, no thyromegaly or thyroid nodule.  Lungs: Clear to auscultation bilaterally with normal respiratory effort. CV: Nondisplaced PMI.  Heart regular S1/S2, no S3/S4, no murmur.  2+ edema to thighs.   Abdomen: Soft, nontender, no hepatosplenomegaly, mild distention.  Neurologic: Alert and oriented x 3.  Psych: Normal  affect. Extremities: No clubbing or cyanosis. Lower legs wrapped.     Data Reviewed: I have personally reviewed following labs and imaging studies  Micro Results No results found for this or any previous visit (from the past 240 hour(s)).  Radiology Reports Dg Chest 2 View  Result Date: 01/28/2017 CLINICAL DATA:  Left leg swelling for 3 days EXAM: CHEST  2 VIEW COMPARISON:  06/26/2016 FINDINGS: There is cardiomegaly with mild central congestion. No large effusion or focal consolidation. No pneumothorax. IMPRESSION: Cardiomegaly with slight central congestion. No acute infiltrate or edema. Electronically Signed   By: Jasmine Pang M.D.   On: 01/28/2017 19:02     CBC  Recent Labs Lab 01/28/17 1455 01/29/17 0527 01/30/17 0446 01/31/17 0514  WBC 9.8 11.6* 10.9* 10.2  HGB 15.2* 14.6 14.1 14.0  HCT 48.4* 46.8* 45.1 44.6  PLT 292 291 287 306  MCV 90.6 90.5 89.3 89.2  MCH 28.5 28.2 27.9 28.0  MCHC 31.4 31.2 31.3 31.4  RDW 15.2 15.4 15.3 15.2  LYMPHSABS 1.6  --   --   --   MONOABS 0.7  --   --   --   EOSABS 0.1  --   --   --   BASOSABS 0.0  --   --   --     Chemistries   Recent Labs Lab 01/28/17 1455 01/28/17 1705 01/29/17 0527 01/30/17 0446 01/31/17 0514 02/01/17 0450 02/02/17 0354  NA 141  --  142 138 136 137 137  K 3.2*  --  4.0 4.2 4.1 3.7 4.1  CL 106  --  103 99* 93* 96* 96*  CO2 25  --  23 28 32 32 30  GLUCOSE 105*  --  70 130* 92 100* 102*  BUN 18  --  14 20 24* 24* 23*  CREATININE 1.19*  --  1.17* 1.35* 1.43* 1.31* 1.33*  CALCIUM 9.1  --  9.3 9.5 9.2 9.1 9.0  MG  --  2.0  --   --   --   --   --   AST 29  --   --  26 26  --  22  ALT 20  --   --  17 14  --  11*  ALKPHOS 220*  --   --  222* 218*  --  183*  BILITOT 2.6*  --   --  1.5* 1.5*  --  1.2   ------------------------------------------------------------------------------------------------------------------ estimated creatinine clearance is 45.1 mL/min (A) (by C-G formula based on SCr of 1.33 mg/dL  (H)). ------------------------------------------------------------------------------------------------------------------ No results for input(s): HGBA1C in the last 72 hours. ------------------------------------------------------------------------------------------------------------------ No results for input(s): CHOL, HDL, LDLCALC, TRIG, CHOLHDL, LDLDIRECT in the last 72 hours. ------------------------------------------------------------------------------------------------------------------ No results for input(s): TSH, T4TOTAL, T3FREE, THYROIDAB in the last 72 hours.  Invalid  input(s): FREET3 ------------------------------------------------------------------------------------------------------------------ No results for input(s): VITAMINB12, FOLATE, FERRITIN, TIBC, IRON, RETICCTPCT in the last 72 hours.  Coagulation profile No results for input(s): INR, PROTIME in the last 168 hours.  No results for input(s): DDIMER in the last 72 hours.  Cardiac Enzymes  Recent Labs Lab 01/28/17 1705  TROPONINI <0.03   ------------------------------------------------------------------------------------------------------------------ Invalid input(s): POCBNP   CBG: No results for input(s): GLUCAP in the last 168 hours.     Studies: No results found.    Lab Results  Component Value Date   HGBA1C 5.8 (H) 05/04/2014   Lab Results  Component Value Date   LDLCALC 142 (H) 03/03/2016   CREATININE 1.33 (H) 02/02/2017       Scheduled Meds: . atorvastatin  80 mg Oral q1800  . carvedilol  3.125 mg Oral BID WC  .  ceFAZolin (ANCEF) IV  1 g Intravenous Q8H  . cetirizine  10 mg Oral Daily  . enoxaparin (LOVENOX) injection  40 mg Subcutaneous Q24H  . ferrous sulfate  325 mg Oral Once per day on Tue Thu  . furosemide  80 mg Intravenous Q8H  . potassium chloride  40 mEq Oral BID  . sacubitril-valsartan  1 tablet Oral BID  . spironolactone  12.5 mg Oral Daily  . ticagrelor  60 mg Oral BID    Continuous Infusions:   LOS: 5 days    Time spent: >30 MINS    [facility]  [facility] Pager [phone]. If 7PM-7AM, please contact night-coverage at [email or web address], password [facility] 02/02/2017, 10:16 AM  LOS: 5 days

## 2017-02-02 NOTE — Evaluation (Signed)
Physical Therapy Evaluation Patient Details Name: Deborah Ho MRN: [id number] DOB: 1950-12-29 Today's Date: 02/02/2017   History of Present Illness  Pt adm with acute on chronic heart failure and LLE cellulitis. PMH - HTN, chf, STEMI, ckd  Clinical Impression  Pt doing well with mobility and no further PT needed.  Ready for dc from PT standpoint.      Follow Up Recommendations No PT follow up    Equipment Recommendations  None recommended by PT    Recommendations for Other Services       Precautions / Restrictions Precautions Precautions: None Restrictions Weight Bearing Restrictions: No      Mobility  Bed Mobility               General bed mobility comments: Pt sitting EOB  Transfers Overall transfer level: Independent                  Ambulation/Gait Ambulation/Gait assistance: Independent Ambulation Distance (Feet): 350 Feet Assistive device: None Gait Pattern/deviations: WFL(Within Functional Limits)     General Gait Details: Steady gait without difficulty  Stairs            Wheelchair Mobility    Modified Rankin (Stroke Patients Only)       Balance Overall balance assessment: No apparent balance deficits (not formally assessed)                                           Pertinent Vitals/Pain Pain Assessment: No/denies pain    Home Living Family/patient expects to be discharged to:: Private residence Living Arrangements: Alone Available Help at Discharge: Family;Friend(s);Available PRN/intermittently                  Prior Function Level of Independence: Independent               Hand Dominance   Dominant Hand: Right    Extremity/Trunk Assessment   Upper Extremity Assessment Upper Extremity Assessment: Overall WFL for tasks assessed    Lower Extremity Assessment Lower Extremity Assessment: Overall WFL for tasks assessed       Communication   Communication: No difficulties   Cognition Arousal/Alertness: Awake/alert Behavior During Therapy: WFL for tasks assessed/performed Overall Cognitive Status: Within Functional Limits for tasks assessed                      General Comments      Exercises     Assessment/Plan    PT Assessment Patent does not need any further PT services  PT Problem List         PT Treatment Interventions      PT Goals (Current goals can be found in the Care Plan section)  Acute Rehab PT Goals PT Goal Formulation: All assessment and education complete, DC therapy    Frequency     Barriers to discharge        Co-evaluation               End of Session   Activity Tolerance: Patient tolerated treatment well Patient left: in chair;with call bell/phone within reach Nurse Communication: Mobility status PT Visit Diagnosis: Difficulty in walking, not elsewhere classified (R26.2)         Time: 7510-2585 PT Time Calculation (min) (ACUTE ONLY): 14 min   Charges:   PT Evaluation $PT Eval Low Complexity: 1 Procedure  PT G CodesAngelina Ok [facility] 03/04/2017, 3:16 PM  [employer] PT [phone]

## 2017-02-02 NOTE — Progress Notes (Signed)
Was called by patient that her left  leg is wet. Noted  left lower leg is weeping , wrapped with abdominal pads and kerlix.

## 2017-02-02 NOTE — Progress Notes (Signed)
Patient ID: Deborah Ho, female   DOB: June 11, 1951, 66 y.o.   MRN: [id number]   SUBJECTIVE: Admitted 01/28/17 with marked volume overload. Started on IV diuresis - 80mg  Lasix q 8 hours, had one dose of metolazone 2.5mg . Weight down 31 pounds since admission. -14L total.   Entresto 24/26 mg bid started 3/19.   Feels ok this morning, denies dizziness. Has ambulated this morning.    Scheduled Meds: . atorvastatin  80 mg Oral q1800  . carvedilol  3.125 mg Oral BID WC  .  ceFAZolin (ANCEF) IV  1 g Intravenous Q8H  . cetirizine  10 mg Oral Daily  . enoxaparin (LOVENOX) injection  40 mg Subcutaneous Q24H  . ferrous sulfate  325 mg Oral Once per day on Tue Thu  . furosemide  80 mg Intravenous Q8H  . potassium chloride  40 mEq Oral BID  . sacubitril-valsartan  1 tablet Oral BID  . spironolactone  12.5 mg Oral Daily  . ticagrelor  60 mg Oral BID   Continuous Infusions: PRN Meds:.acetaminophen **OR** acetaminophen, morphine injection, traMADol    Vitals:   02/01/17 1652 02/01/17 2209 02/02/17 0627 02/02/17 0629  BP: 121/68 118/64 136/73   Pulse: 87 88 92   Resp:  16 17   Temp:  98.7 F (37.1 C) 97.8 F (36.6 C)   TempSrc:  Oral Oral   SpO2:  98% 99%   Weight:    199 lb 12.8 oz (90.6 kg)  Height:        Intake/Output Summary (Last 24 hours) at 02/02/17 0744 Last data filed at 02/02/17 02/04/17  Gross per 24 hour  Intake              360 ml  Output             4400 ml  Net            -4040 ml    LABS: Basic Metabolic Panel:  Recent Labs  7829 0450 02/02/17 0354  NA 137 137  K 3.7 4.1  CL 96* 96*  CO2 32 30  GLUCOSE 100* 102*  BUN 24* 23*  CREATININE 1.31* 1.33*  CALCIUM 9.1 9.0   Liver Function Tests:  Recent Labs  01/31/17 0514 02/02/17 0354  AST 26 22  ALT 14 11*  ALKPHOS 218* 183*  BILITOT 1.5* 1.2  PROT 6.4* 6.1*  ALBUMIN 3.1* 2.9*   CBC:  Recent Labs  01/31/17 0514  WBC 10.2  HGB 14.0  HCT 44.6  MCV 89.2  PLT 306    RADIOLOGY: Dg Chest  2 View  Result Date: 01/28/2017 CLINICAL DATA:  Left leg swelling for 3 days EXAM: CHEST  2 VIEW COMPARISON:  06/26/2016 FINDINGS: There is cardiomegaly with mild central congestion. No large effusion or focal consolidation. No pneumothorax. IMPRESSION: Cardiomegaly with slight central congestion. No acute infiltrate or edema. Electronically Signed   By: 08/26/2016 M.D.   On: 01/28/2017 19:02    PHYSICAL EXAM General: NAD, up in chair.  Neck: JVP 10-11 cm, supple.  Lungs: Clear in all lobes.  CV:  Heart regular S1/S2, no S3/S4, no murmur. 1+ edema to thighs.  Abdomen: Soft,nontender, no hepatosplenomegaly, no distention Neurologic: Alert and oriented x 4.  Psych:Normal affect, pleasant.  Extremities: No clubbing or cyanosis. Bilateral unna boots in place. Dry, warm    TELEMETRY: NSR- personally reviewed.   ASSESSMENT AND PLAN: 66 yo with history of CAD s/p anterior MI, ischemic cardiomyopathy/chronic systolic CHF, and noncompliance with  followup presented with acute on chronic systolic CHF, marked volume overload.  1. Acute on chronic systolic CHF, NYHA II: She has been noncompliant with followup but says that she has been taking her meds. Echo this admission shows EF 20-25%, moderately dilated RV with moderately decreased systolic function. Volume status improving.  - Continue IV diuresis with Lasix 80 mg q 8 hours today.  - Continue Entresto 24/26 mg BID. BP stable after starting Entresto. She denies dizziness. - Continue low dose Coreg.  - We discussed spironolactone, she agrees to take it.  Increase to 25 mg daily.  2. CKD: Stage II-III.  Stable.  - Creatinine 1.35->1.43->1.31->1.33.  3. CAD: Anterior STEMI in 6/15 with PCI, did not take ticagrelor and had stent thrombosis shortly afterwards.  No chest pain.  - Continue ticagrelor,and statin, allergic to aspirin.   4. Noncompliance: Continue to stress the importance of medication and dietary compliance.   Little Ishikawa 02/02/2017 7:44 AM   Little Ishikawa, NP-C Advanced Heart Failure Team  Pager [phone] M-F 7am-4pm.  Please contact [facility] for night-coverage after hours (4p -7a ) and weekends on [email or web address]  Patient seen with NP, agree with the above note.  She is still volume overloaded but improving.  Good diuresis so far.  Continue current IV Lasix.  Increase spironolactone to 25 mg daily today (she agrees to take).  Possible back to po diuretics tomorrow.   [name] 02/02/2017 12:31 PM

## 2017-02-02 NOTE — Progress Notes (Signed)
BLE Unna boot removed per MD.

## 2017-02-03 DIAGNOSIS — L03116 Cellulitis of left lower limb: Secondary | ICD-10-CM

## 2017-02-03 LAB — BASIC METABOLIC PANEL
Anion gap: 13 (ref 5–15)
BUN: 24 mg/dL — ABNORMAL HIGH (ref 6–20)
CO2: 26 mmol/L (ref 22–32)
Calcium: 9.3 mg/dL (ref 8.9–10.3)
Chloride: 97 mmol/L — ABNORMAL LOW (ref 101–111)
Creatinine, Ser: 1.2 mg/dL — ABNORMAL HIGH (ref 0.44–1.00)
GFR calc Af Amer: 54 mL/min — ABNORMAL LOW (ref >60)
GFR calc non Af Amer: 46 mL/min — ABNORMAL LOW (ref >60)
Glucose, Bld: 97 mg/dL (ref 65–99)
Potassium: 4.7 mmol/L (ref 3.5–5.1)
Sodium: 136 mmol/L (ref 135–145)

## 2017-02-03 MED ORDER — VANCOMYCIN HCL 10 G IV SOLR
1250.0000 mg | INTRAVENOUS | Status: DC
  Administered 2017-02-03 – 2017-02-06 (×4): 1250 mg via INTRAVENOUS
  Filled 2017-02-03 (×4): qty 1250

## 2017-02-03 MED ORDER — TORSEMIDE 20 MG PO TABS
60.0000 mg | ORAL_TABLET | Freq: Every day | ORAL | Status: DC
  Administered 2017-02-03 – 2017-02-06 (×4): 60 mg via ORAL
  Filled 2017-02-03 (×4): qty 3

## 2017-02-03 NOTE — Progress Notes (Signed)
Pt reports some peeling off of approximately one inch of the allevyn foam dressing perimeter.   Dressing largely in place, re-secured with own adhesive, and wrapped with kerlex to secure. Pt reports being pleased with the comfort of th wrap. Sock placed over wrap to prevent skating on gauze.

## 2017-02-03 NOTE — Progress Notes (Signed)
[facility] PROGRESS NOTE  Deborah Ho BMW:[id number] DOB: [birth year] DOA: 01/28/2017   PCP: Grayce Sessions, NP     Assessment/Plan: Principal Problem:   Acute on chronic systolic and diastolic heart failure, NYHA class 1 (HCC) Active Problems:   Elevated LFTs   Coronary atherosclerosis of native coronary artery   Essential hypertension   CKD (chronic kidney disease), stage III   Hypokalemia   Cellulitis of left lower leg   Deborah Ho is a 66 y.o. female with a past medical history significant for CHF EF 15%, medical noncompliance, HTN, and CAD s/p PCI/STEMI who presents with gradual leg swelling and pain and weeping.admitted for cellulitis of the left lower extremity and CHF exacerbation.   Assessment and plan 1. Acute on chronic systolic and diastolic CHF: history of chronic systolic CHF last EF was 15% in April 2017.Echo this admission shows EF 20-25%, moderately dilated RV with moderately decreased systolic function Leg swelling bilaterally, weeping, poor medication knowledge, poor insight,  noncompliance, refusing medications even in the hospital, she stopped outpatient BB, ACEi, spiro, hydral/nitrates,  and noncompliant with cardiology follow-ups Currently diuretics per cardiology,Continue IV diuresis today with Lasix 80mg  TID, her weight is up 3 pounds Off lisinopril now, cards  started Entresto,aldactone, Coreg  . increased to 25mg  Renal function stable, bmp in am  Last weight in clinic (07/2016) was 192 pounds, up to 230 pounds this admission. Now down to 199>202  pounds  Creatinine slightly up today 1.19>1.43>1.31>1.33>1.20 Appreciate cardiology recommendations      2. Leg lower extremity edema/cellulitis This is I think purely from #1.  Negative vascular ultrasound. On cefazolin, which will be continued for left lower extremity edema, possible cellulitis Elevate leg when in bed Removed  UNNA boot due to pain Change abx from cefazolin to iv  vancomycin with close monitoring of renal function   3. Hypokalemia:resolved  Mild.Potassium repleted.4.0, magnesium 2.0   4. Elevated LFTs: resolved ,Suspect congestion.  Present before.     5. Hypertension:  Continue  meds as above    6. Coronary artery disease: DES to LAD in 04/2014 with readmission the following week for acute in-stent thrombosis -Continue statin and Brilinta  7. chronic CKD stage III:  Baseline creatinine is 1.5-1.6  -- Follow with daily BMET   DVT prophylaxsis Lovenox  Code Status:  Full code    Family Communication: Discussed in detail with the patient, all imaging results, lab results explained to the patient   Disposition Plan:  Plan as per cardiology recommendations      Consultants:  Cardiology  Procedures: None  Antibiotics: Anti-infectives    Start     Dose/Rate Route Frequency Ordered Stop   02/03/17 1000  vancomycin (VANCOCIN) 1,250 mg in sodium chloride 0.9 % 250 mL IVPB     1,250 mg 166.7 mL/hr over 90 Minutes Intravenous Every 24 hours 02/03/17 0914     01/29/17 1400  ceFAZolin (ANCEF) IVPB 1 g/50 mL premix  Status:  Discontinued     1 g 100 mL/hr over 30 Minutes Intravenous Every 8 hours 01/29/17 1159 02/03/17 0847   01/28/17 1715  ceFAZolin (ANCEF) IVPB 1 g/50 mL premix     1 g 100 mL/hr over 30 Minutes Intravenous  Once 01/28/17 1707 01/28/17 1814         HPI/Subjective: Complaining of her left leg weeping overnight, still has significant amount of pain in her left leg  Objective: Vitals:   02/02/17 0950 02/02/17 2123 02/03/17  0442 02/03/17 0831  BP: 109/71 122/69 127/71 114/63  Pulse: 96 93 95 92  Resp: 16 18 18    Temp: 97.8 F (36.6 C) 97.8 F (36.6 C) 97.8 F (36.6 C)   TempSrc: Oral Oral Oral   SpO2: 96% 96% 95%   Weight:   91.7 kg (202 lb 1.6 oz)   Height:        Intake/Output Summary (Last 24 hours) at 02/03/17 02/05/17 Last data filed at 02/03/17 0900  Gross per 24 hour  Intake              1410 ml  Output             3600 ml  Net            -2190 ml    Exam:  Examination:  General exam: Appears calm and comfortable  Neck: JVP 16 cm, no thyromegaly or thyroid nodule.  Lungs: Clear to auscultation bilaterally with normal respiratory effort. CV: Nondisplaced PMI.  Heart regular S1/S2, no S3/S4, no murmur.  2+ edema to thighs.   Abdomen: Soft, nontender, no hepatosplenomegaly, mild distention.  Neurologic: Alert and oriented x 3.  Psych: Normal affect. Extremities: Erythema and duration of the left leg, with 2 cm x 2 cm blister below her left calf    Data Reviewed: I have personally reviewed following labs and imaging studies  Micro Results No results found for this or any previous visit (from the past 240 hour(s)).  Radiology Reports Dg Chest 2 View  Result Date: 01/28/2017 CLINICAL DATA:  Left leg swelling for 3 days EXAM: CHEST  2 VIEW COMPARISON:  06/26/2016 FINDINGS: There is cardiomegaly with mild central congestion. No large effusion or focal consolidation. No pneumothorax. IMPRESSION: Cardiomegaly with slight central congestion. No acute infiltrate or edema. Electronically Signed   By: 08/26/2016 M.D.   On: 01/28/2017 19:02     CBC  Recent Labs Lab 01/28/17 1455 01/29/17 0527 01/30/17 0446 01/31/17 0514  WBC 9.8 11.6* 10.9* 10.2  HGB 15.2* 14.6 14.1 14.0  HCT 48.4* 46.8* 45.1 44.6  PLT 292 291 287 306  MCV 90.6 90.5 89.3 89.2  MCH 28.5 28.2 27.9 28.0  MCHC 31.4 31.2 31.3 31.4  RDW 15.2 15.4 15.3 15.2  LYMPHSABS 1.6  --   --   --   MONOABS 0.7  --   --   --   EOSABS 0.1  --   --   --   BASOSABS 0.0  --   --   --     Chemistries   Recent Labs Lab 01/28/17 1455 01/28/17 1705  01/30/17 0446 01/31/17 0514 02/01/17 0450 02/02/17 0354 02/03/17 0458  NA 141  --   < > 138 136 137 137 136  K 3.2*  --   < > 4.2 4.1 3.7 4.1 4.7  CL 106  --   < > 99* 93* 96* 96* 97*  CO2 25  --   < > 28 32 32 30 26  GLUCOSE 105*  --   < > 130* 92 100* 102*  97  BUN 18  --   < > 20 24* 24* 23* 24*  CREATININE 1.19*  --   < > 1.35* 1.43* 1.31* 1.33* 1.20*  CALCIUM 9.1  --   < > 9.5 9.2 9.1 9.0 9.3  MG  --  2.0  --   --   --   --   --   --   AST 29  --   --  26 26  --  22  --   ALT 20  --   --  17 14  --  11*  --   ALKPHOS 220*  --   --  222* 218*  --  183*  --   BILITOT 2.6*  --   --  1.5* 1.5*  --  1.2  --   < > = values in this interval not displayed. ------------------------------------------------------------------------------------------------------------------ estimated creatinine clearance is 50.2 mL/min (A) (by C-G formula based on SCr of 1.2 mg/dL (H)). ------------------------------------------------------------------------------------------------------------------ No results for input(s): HGBA1C in the last 72 hours. ------------------------------------------------------------------------------------------------------------------ No results for input(s): CHOL, HDL, LDLCALC, TRIG, CHOLHDL, LDLDIRECT in the last 72 hours. ------------------------------------------------------------------------------------------------------------------ No results for input(s): TSH, T4TOTAL, T3FREE, THYROIDAB in the last 72 hours.  Invalid input(s): FREET3 ------------------------------------------------------------------------------------------------------------------ No results for input(s): VITAMINB12, FOLATE, FERRITIN, TIBC, IRON, RETICCTPCT in the last 72 hours.  Coagulation profile No results for input(s): INR, PROTIME in the last 168 hours.  No results for input(s): DDIMER in the last 72 hours.  Cardiac Enzymes  Recent Labs Lab 01/28/17 1705  TROPONINI <0.03   ------------------------------------------------------------------------------------------------------------------ Invalid input(s): POCBNP   CBG: No results for input(s): GLUCAP in the last 168 hours.     Studies: No results found.    Lab Results  Component Value Date    HGBA1C 5.8 (H) 05/04/2014   Lab Results  Component Value Date   LDLCALC 142 (H) 03/03/2016   CREATININE 1.20 (H) 02/03/2017       Scheduled Meds: . atorvastatin  80 mg Oral q1800  . carvedilol  3.125 mg Oral BID WC  . cetirizine  10 mg Oral Daily  . enoxaparin (LOVENOX) injection  40 mg Subcutaneous Q24H  . ferrous sulfate  325 mg Oral Once per day on Tue Thu  . furosemide  80 mg Intravenous Q8H  . potassium chloride  40 mEq Oral BID  . sacubitril-valsartan  1 tablet Oral BID  . spironolactone  25 mg Oral Daily  . ticagrelor  60 mg Oral BID  . vancomycin  1,250 mg Intravenous Q24H   Continuous Infusions:   LOS: 6 days    Time spent: >30 MINS    [facility]  [facility] Pager [phone]. If 7PM-7AM, please contact night-coverage at [email or web address], password [facility] 02/03/2017, 9:24 AM  LOS: 6 days

## 2017-02-03 NOTE — Progress Notes (Signed)
Patient's left lower leg unwrapped; per pt and MD the leg/swelling/cellulitis looks better today.   allevyn foam dressing applied to the 'blister' open/weeping area of the posterior low leg, and otherwise left unwrapped for leg to breathe. Legs elevated for now, will re-assess the bandage, and apply wrap if necessary.

## 2017-02-03 NOTE — Progress Notes (Signed)
Pharmacy Antibiotic Note  Deborah Ho is a 66 y.o. female with worsening LE cellulitis on cefazolin, now to broaden to vancomycin. Renal function stable.  Vancomycin trough goal 10-15  Plan: 1) Vancomycin 1250mg  IV q24 2) Follow renal function, LOT, level if needed  Height: 5\' 3"  (160 cm) Weight: 202 lb 1.6 oz (91.7 kg) IBW/kg (Calculated) : 52.4  Temp (24hrs), Avg:97.8 F (36.6 C), Min:97.8 F (36.6 C), Max:97.8 F (36.6 C)   Recent Labs Lab 01/28/17 1455 01/29/17 0527 01/30/17 0446 01/31/17 0514 02/01/17 0450 02/02/17 0354 02/03/17 0458  WBC 9.8 11.6* 10.9* 10.2  --   --   --   CREATININE 1.19* 1.17* 1.35* 1.43* 1.31* 1.33* 1.20*    Estimated Creatinine Clearance: 50.2 mL/min (A) (by C-G formula based on SCr of 1.2 mg/dL (H)).    Allergies  Allergen Reactions  . Aspirin Swelling    Chewable children's aspirin makes patients tongue and face swell  . Effient [Prasugrel] Swelling    Patient's tongue and face swells  . Entresto [Sacubitril-Valsartan] Other (See Comments)    dizziness  . Lactose Intolerance (Gi) Other (See Comments)    REACTION: stomach upset  . Robitussin Dm [Guaifenesin-Dm] Swelling    Patient's tongue swells  . Sulfa Antibiotics Swelling  . Wheat Bran Other (See Comments)    REACTION: unknown    Antimicrobials this admission: Cefazolin 3/16 >> 3/21 Vancomycin 3/21 >>   Dose adjustments this admission: n/a  Microbiology results: n/a  Thank you for allowing pharmacy to be a part of this patient's care.  4/21 02/03/2017 9:24 AM

## 2017-02-03 NOTE — Progress Notes (Signed)
Patient ID: Deborah Ho, female   DOB: May 01, 1951, 66 y.o.   MRN: [id number]   SUBJECTIVE: Admitted 01/28/17 with marked volume overload. Started on IV diuresis - 80mg  Lasix q 8 hours, had one dose of metolazone 2.5mg . Weight down 28 pounds since admission. -17L total.   Entresto 24/26 mg bid started 3/19.   Up and out of bed this am. Worried about her left leg, there is a 5-6cm blister present on her left calf. Denies dizziness and SOB.   Scheduled Meds: . atorvastatin  80 mg Oral q1800  . carvedilol  3.125 mg Oral BID WC  . cetirizine  10 mg Oral Daily  . enoxaparin (LOVENOX) injection  40 mg Subcutaneous Q24H  . ferrous sulfate  325 mg Oral Once per day on Tue Thu  . furosemide  80 mg Intravenous Q8H  . potassium chloride  40 mEq Oral BID  . sacubitril-valsartan  1 tablet Oral BID  . spironolactone  25 mg Oral Daily  . ticagrelor  60 mg Oral BID   Continuous Infusions: PRN Meds:.acetaminophen **OR** acetaminophen, morphine injection, traMADol    Vitals:   02/02/17 0950 02/02/17 2123 02/03/17 0442 02/03/17 0831  BP: 109/71 122/69 127/71 114/63  Pulse: 96 93 95 92  Resp: 16 18 18    Temp: 97.8 F (36.6 C) 97.8 F (36.6 C) 97.8 F (36.6 C)   TempSrc: Oral Oral Oral   SpO2: 96% 96% 95%   Weight:   202 lb 1.6 oz (91.7 kg)   Height:        Intake/Output Summary (Last 24 hours) at 02/03/17 0856 Last data filed at 02/03/17 0800  Gross per 24 hour  Intake             1290 ml  Output             3600 ml  Net            -2310 ml    LABS: Basic Metabolic Panel:  Recent Labs  02/05/17 0354 02/03/17 0458  NA 137 136  K 4.1 4.7  CL 96* 97*  CO2 30 26  GLUCOSE 102* 97  BUN 23* 24*  CREATININE 1.33* 1.20*  CALCIUM 9.0 9.3   Liver Function Tests:  Recent Labs  02/02/17 0354  AST 22  ALT 11*  ALKPHOS 183*  BILITOT 1.2  PROT 6.1*  ALBUMIN 2.9*    RADIOLOGY: Dg Chest 2 View  Result Date: 01/28/2017 CLINICAL DATA:  Left leg swelling for 3 days EXAM: CHEST   2 VIEW COMPARISON:  06/26/2016 FINDINGS: There is cardiomegaly with mild central congestion. No large effusion or focal consolidation. No pneumothorax. IMPRESSION: Cardiomegaly with slight central congestion. No acute infiltrate or edema. Electronically Signed   By: 01/30/2017 M.D.   On: 01/28/2017 19:02    PHYSICAL EXAM General: NAD, up in chair.  Neck: JVP 7-8 cm, supple.  Lungs: CTA in all lobes.  CV:  Heart regular S1/S2, no S3/S4, no murmur. 1+ pedal edema, L>R  Abdomen: Soft, nontender, no hepatosplenomegaly, not distended.  Neurologic: Alert and oriented x 4.  Psych: Normal affect, pleasant.  Extremities: No clubbing or cyanosis. Extremities warm. Left lower leg still weeping, now with 5-6 cm blister present on left calf.   TELEMETRY: NSR- personally reviewed.   ASSESSMENT AND PLAN: 66 yo with history of CAD s/p anterior MI, ischemic cardiomyopathy/chronic systolic CHF, and noncompliance with followup presented with acute on chronic systolic CHF, marked volume overload.  1. Acute on  chronic systolic CHF, NYHA II: She has been noncompliant with followup but says that she has been taking her meds. Echo this admission shows EF 20-25%, moderately dilated RV with moderately decreased systolic function. Volume status improving.  - She got IV Lasix this morning, will transition to po torsemide 60 daily.  - Continue Entresto 24/26 mg BID. BP stable after starting Entresto. She denies dizziness. - Continue low dose Coreg, if BP stable increase to 6.25 mg bid tomorrow.  Deborah Ho increased to 25 mg, first dose to be given today.  2. CKD: Stage II-III.  Stable.  - Creatinine 1.35->1.43->1.31->1.33->1.20 3. CAD: Anterior STEMI in 6/15 with PCI, did not take ticagrelor and had stent thrombosis shortly afterwards.  No chest pain.  - Continue ticagrelor,and statin, allergic to aspirin.   4. Left lower leg cellulitis: Still red.  Antibiotic changed to vancomycin today.  Needs to keep leg raised.    5. Noncompliance: Reiterated the importance of dietary and medication compliance.   Deborah Ho 02/03/2017 8:56 AM   Deborah Ishikawa, NP-C Advanced Heart Failure Team  Pager [phone] M-F 7am-4pm.  Please contact [facility] for night-coverage after hours (4p -7a ) and weekends on [email or web address]  Patient seen with NP, agree with the above note.  She has diuresed well, volume status improved, no JVD today.  Creatinine stable. - Transition to po torsemide.   Suspected left lower leg cellulitis.  Antibiotic changed to vancomycin.   Deborah Ho 02/03/2017 9:42 AM

## 2017-02-03 NOTE — Evaluation (Signed)
Occupational Therapy Evaluation Patient Details Name: Deborah Ho MRN: [id number] DOB: [birth year] Today's Date: 02/03/2017    History of Present Illness Pt adm with acute on chronic heart failure and LLE cellulitis. PMH - HTN, chf, STEMI, ckd   Clinical Impression   Pt is performing ADL and mobility independently. She denies any activity limitations due to CHF. No OT needs.    Follow Up Recommendations  No OT follow up    Equipment Recommendations  None recommended by OT    Recommendations for Other Services       Precautions / Restrictions Precautions Precautions: None Restrictions Weight Bearing Restrictions: No      Mobility Bed Mobility               General bed mobility comments: pt in chair  Transfers Overall transfer level: Independent               General transfer comment: pushed IV pole    Balance Overall balance assessment: No apparent balance deficits (not formally assessed)                                          ADL Overall ADL's : Independent                                             Vision Patient Visual Report: No change from baseline       Perception     Praxis      Pertinent Vitals/Pain Pain Assessment: Faces Faces Pain Scale: Hurts a little bit Pain Location: L LE     Hand Dominance Right   Extremity/Trunk Assessment Upper Extremity Assessment Upper Extremity Assessment: Overall WFL for tasks assessed   Lower Extremity Assessment Lower Extremity Assessment: Overall WFL for tasks assessed       Communication Communication Communication: No difficulties   Cognition Arousal/Alertness: Awake/alert Behavior During Therapy: WFL for tasks assessed/performed Overall Cognitive Status: Within Functional Limits for tasks assessed                     General Comments       Exercises       Shoulder Instructions      Home Living Family/patient expects to be  discharged to:: Private residence Living Arrangements: Alone Available Help at Discharge: Family;Friend(s);Available PRN/intermittently Type of Home: Apartment Home Access: Elevator     Home Layout: One level     Bathroom Shower/Tub: Chief Strategy Officer: Standard     Home Equipment: Grab bars - toilet;Grab bars - tub/shower   Additional Comments: lives in [place]      Prior Functioning/Environment Level of Independence: Independent                 OT Problem List: Pain      OT Treatment/Interventions:      OT Goals(Current goals can be found in the care plan section) Acute Rehab OT Goals Patient Stated Goal: to avoid further infections  OT Frequency:     Barriers to D/C:            Co-evaluation              End of Session    Activity Tolerance: Patient tolerated treatment well Patient left:  in chair;with call bell/phone within reach  OT Visit Diagnosis: Pain Pain - Right/Left: Left Pain - part of body: Leg                ADL either performed or assessed with clinical judgement  Time: 1100-1122 OT Time Calculation (min): 22 min Charges:  OT General Charges $OT Visit: 1 Procedure OT Evaluation $OT Eval Low Complexity: 1 Procedure G-Codes:       Evern Bio 02/03/2017, 11:35 AM  [phone]

## 2017-02-04 LAB — CBC
HCT: 49 % — ABNORMAL HIGH (ref 36.0–46.0)
Hemoglobin: 15.5 g/dL — ABNORMAL HIGH (ref 12.0–15.0)
MCH: 28.4 pg (ref 26.0–34.0)
MCHC: 31.6 g/dL (ref 30.0–36.0)
MCV: 89.9 fL (ref 78.0–100.0)
Platelets: 337 10*3/uL (ref 150–400)
RBC: 5.45 MIL/uL — ABNORMAL HIGH (ref 3.87–5.11)
RDW: 15.3 % (ref 11.5–15.5)
WBC: 8.9 10*3/uL (ref 4.0–10.5)

## 2017-02-04 LAB — BASIC METABOLIC PANEL
Anion gap: 12 (ref 5–15)
BUN: 28 mg/dL — ABNORMAL HIGH (ref 6–20)
CO2: 30 mmol/L (ref 22–32)
Calcium: 9.4 mg/dL (ref 8.9–10.3)
Chloride: 95 mmol/L — ABNORMAL LOW (ref 101–111)
Creatinine, Ser: 1.43 mg/dL — ABNORMAL HIGH (ref 0.44–1.00)
GFR calc Af Amer: 43 mL/min — ABNORMAL LOW (ref >60)
GFR calc non Af Amer: 38 mL/min — ABNORMAL LOW (ref >60)
Glucose, Bld: 123 mg/dL — ABNORMAL HIGH (ref 65–99)
Potassium: 4.6 mmol/L (ref 3.5–5.1)
Sodium: 137 mmol/L (ref 135–145)

## 2017-02-04 MED ORDER — CARVEDILOL 6.25 MG PO TABS
6.2500 mg | ORAL_TABLET | Freq: Two times a day (BID) | ORAL | Status: DC
  Administered 2017-02-05 – 2017-02-06 (×2): 6.25 mg via ORAL
  Filled 2017-02-04 (×3): qty 1

## 2017-02-04 NOTE — Progress Notes (Signed)
Patient ID: Deborah Ho, female   DOB: 1951-07-26, 66 y.o.   MRN: [id number]   SUBJECTIVE: Admitted 01/28/17 with marked volume overload. Started on IV diuresis - 80mg  Lasix q 8 hours, had one dose of metolazone 2.5mg . Weight down 31 pounds since admission. -17L total.   Entresto 24/26 mg bid started 3/19. Tolerating well, no dizziness.   Started IV Vanc yesterday for LLE cellulitis.   Scheduled Meds: . atorvastatin  80 mg Oral q1800  . carvedilol  3.125 mg Oral BID WC  . cetirizine  10 mg Oral Daily  . enoxaparin (LOVENOX) injection  40 mg Subcutaneous Q24H  . ferrous sulfate  325 mg Oral Once per day on Tue Thu  . potassium chloride  40 mEq Oral BID  . sacubitril-valsartan  1 tablet Oral BID  . spironolactone  25 mg Oral Daily  . ticagrelor  60 mg Oral BID  . torsemide  60 mg Oral Daily  . vancomycin  1,250 mg Intravenous Q24H   Continuous Infusions: PRN Meds:.acetaminophen **OR** acetaminophen, morphine injection, traMADol    Vitals:   02/03/17 1700 02/03/17 2137 02/04/17 0619 02/04/17 0955  BP: 124/64 (!) 122/59 104/61 121/74  Pulse: 90 60 93 98  Resp:  18 17   Temp:  97.9 F (36.6 C) 97.8 F (36.6 C)   TempSrc:  Oral Oral   SpO2:  98% 100%   Weight:   199 lb 14.4 oz (90.7 kg)   Height:        Intake/Output Summary (Last 24 hours) at 02/04/17 1021 Last data filed at 02/04/17 0926  Gross per 24 hour  Intake             1570 ml  Output             1400 ml  Net              170 ml    LABS: Basic Metabolic Panel:  Recent Labs  02/06/17 0458 02/04/17 0407  NA 136 137  K 4.7 4.6  CL 97* 95*  CO2 26 30  GLUCOSE 97 123*  BUN 24* 28*  CREATININE 1.20* 1.43*  CALCIUM 9.3 9.4   Liver Function Tests:  Recent Labs  02/02/17 0354  AST 22  ALT 11*  ALKPHOS 183*  BILITOT 1.2  PROT 6.1*  ALBUMIN 2.9*    RADIOLOGY: Dg Chest 2 View  Result Date: 01/28/2017 CLINICAL DATA:  Left leg swelling for 3 days EXAM: CHEST  2 VIEW COMPARISON:  06/26/2016  FINDINGS: There is cardiomegaly with mild central congestion. No large effusion or focal consolidation. No pneumothorax. IMPRESSION: Cardiomegaly with slight central congestion. No acute infiltrate or edema. Electronically Signed   By: 08/26/2016 M.D.   On: 01/28/2017 19:02    PHYSICAL EXAM General: NAD, up in chair, ordering lunch  Neck: No JVP, supple.   Lungs: CTA in all lobes.  CV:  Heart regular S1/S2, no S3/S4, no murmur.  Abdomen: Soft, nontender, no hepatosplenomegaly, not distended.  Neurologic: Alert and oriented x 4.  Psych: Normal affect, pleasant.  Extremities: No clubbing or cyanosis. Extremities warm. Left lower leg still weeping, dressing intact.  TELEMETRY: NSR- personally reviewed.   ASSESSMENT AND PLAN: 66 yo with history of CAD s/p anterior MI, ischemic cardiomyopathy/chronic systolic CHF, and noncompliance with followup presented with acute on chronic systolic CHF, marked volume overload.  1. Acute on chronic systolic CHF, NYHA II: She has been noncompliant with followup but says that she has been  taking her meds. Echo this admission shows EF 20-25%, moderately dilated RV with moderately decreased systolic function. Volume status improving.  - Transitioned to po Torsemide yesterday. Volume status stable, continues to improve. BP stable.  - Continue Entresto 24/26 mg BID. BP stable after starting Entresto. She denies dizziness. - Increase Coreg to 6.25mg  BID today.  [name] increased to 25 mg yesterday.  2. CKD: Stage II-III. - Worse today, but started Vanc yesterday.  - Creatinine 1.35->1.43->1.31->1.33->1.20->1.43 3. CAD: Anterior STEMI in 6/15 with PCI, did not take ticagrelor and had stent thrombosis shortly afterwards.  No chest pain.  - Continue ticagrelor,and statin, allergic to aspirin.   4. Left lower leg cellulitis: Still red.  Antibiotic changed to vancomycin. Her legs were elevated at the time of my encounter.  5. Noncompliance: Reiterated the importance  of dietary and medication compliance.   Little Ishikawa 02/04/2017 10:21 AM   Little Ishikawa, NP-C Advanced Heart Failure Team  Pager [phone] M-F 7am-4pm.  Please contact [facility] for night-coverage after hours (4p -7a ) and weekends on [email or web address]  Patient seen with NP, agree with the above note.  She has diuresed well, volume status improved, no JVD today.  Creatinine mildly higher.  - Continue po torsemide. - Increase Coreg as above.   Suspected left lower leg cellulitis.  Antibiotic changed to vancomycin.  Given ulceration, I will get peripheral arterial dopplers to assess circulation.   [name] 02/04/2017 2:09 PM

## 2017-02-04 NOTE — Progress Notes (Signed)
[facility] PROGRESS NOTE  Deborah Ho KPT:[id number] DOB: 02-26-1951 DOA: 01/28/2017   PCP: Deborah Sessions, NP     Assessment/Plan: Principal Problem:   Acute on chronic systolic and diastolic heart failure, NYHA class 1 (HCC) Active Problems:   Elevated LFTs   Coronary atherosclerosis of native coronary artery   Essential hypertension   CKD (chronic kidney disease), stage III   Hypokalemia   Cellulitis of left lower leg   Deborah Ho is a 66 y.o. female with a past medical history significant for CHF EF 15%, medical noncompliance, HTN, and CAD s/p PCI/STEMI who presents with gradual leg swelling and pain and weeping.admitted for cellulitis of the left lower extremity and CHF exacerbation.   Assessment and plan 1. Acute on chronic systolic   CHF: history of chronic systolic CHF last EF was 15% in April 2017.Echo this admission shows EF 20-25%, moderately dilated RV with moderately decreased systolic function Leg swelling bilaterally, weeping, poor medication knowledge, poor insight,  noncompliance, refusing medications even in the hospital, she stopped outpatient BB, ACEi, spiro, hydral/nitrates,  and noncompliant with cardiology follow-ups Currently diuretics per cardiology,  Weight down 28 pounds since admission. -17L total. Lasix changed over to torsemide 60 daily.  - Continue Entresto 24/26 mg BID. BP stable after starting Entresto.   - Continue low dose Coreg,  - Spiro increased to 25 mg, first dose to be given today.  Renal function stable, bmp in am  Creatinine slightly up today 1.19>1.43>1.31>1.33>1.20>1.43 Appreciate cardiology recommendations      2. Leg lower extremity edema/cellulitis This is I think purely from #1. Negative vascular ultrasound. Was On cefazolin, with minimal improvement in her cellulitis Elevate leg when in bed, orthotic tech placed UNNA boots  Removed  UNNA boot 3/20 due to pain Now on IV vancomycin, day #2, will  continue, cellulitis very slow to improve ABI pending   3. Hypokalemia: resolved  Potassium now 4.6, monitor closely   4. Elevated LFTs: resolved ,Suspect congestion.      5. Hypertension:  Continue  meds as above for heart failure    6. Coronary artery disease: DES to LAD in 04/2014 with readmission the following week for acute in-stent thrombosis -Continue statin and Brilinta  7. chronic CKD stage III:  Baseline creatinine is 1.5-1.6  Follow with daily BMET   DVT prophylaxsis Lovenox  Code Status:  Full code    Family Communication: Discussed in detail with the patient, all imaging results, lab results explained to the patient   Disposition Plan:   Continue treatment for cellulitis      Consultants:  Cardiology  Procedures: None  Antibiotics: Anti-infectives    Start     Dose/Rate Route Frequency Ordered Stop   02/03/17 1000  vancomycin (VANCOCIN) 1,250 mg in sodium chloride 0.9 % 250 mL IVPB     1,250 mg 166.7 mL/hr over 90 Minutes Intravenous Every 24 hours 02/03/17 0914     01/29/17 1400  ceFAZolin (ANCEF) IVPB 1 g/50 mL premix  Status:  Discontinued     1 g 100 mL/hr over 30 Minutes Intravenous Every 8 hours 01/29/17 1159 02/03/17 0847   01/28/17 1715  ceFAZolin (ANCEF) IVPB 1 g/50 mL premix     1 g 100 mL/hr over 30 Minutes Intravenous  Once 01/28/17 1707 01/28/17 1814         HPI/Subjective: Complaining of her left leg weeping overnight, still has significant amount of pain in her left leg  Objective:  Vitals:   02/03/17 1212 02/03/17 1700 02/03/17 2137 02/04/17 0619  BP: 112/84 124/64 (!) 122/59 104/61  Pulse: 91 90 60 93  Resp: 20  18 17   Temp: 97.3 F (36.3 C)  97.9 F (36.6 C) 97.8 F (36.6 C)  TempSrc: Oral  Oral Oral  SpO2: 99%  98% 100%  Weight:    90.7 kg (199 lb 14.4 oz)  Height:        Intake/Output Summary (Last 24 hours) at 02/04/17 0824 Last data filed at 02/04/17 9381  Gross per 24 hour  Intake              1330 ml  Output             1400 ml  Net              -70 ml    Exam:  Examination:  General exam: Appears calm and comfortable  Neck: JVP 16 cm, no thyromegaly or thyroid nodule.  Lungs: Clear to auscultation bilaterally with normal respiratory effort. CV: Nondisplaced PMI.  Heart regular S1/S2, no S3/S4, no murmur.  2+ edema to thighs.   Abdomen: Soft, nontender, no hepatosplenomegaly, mild distention.  Neurologic: Alert and oriented x 3.  Psych: Normal affect. Extremities: Erythema and duration of the left leg, with 2 cm x 2 cm blister below her left calf    Data Reviewed: I have personally reviewed following labs and imaging studies  Micro Results No results found for this or any previous visit (from the past 240 hour(s)).  Radiology Reports Dg Chest 2 View  Result Date: 01/28/2017 CLINICAL DATA:  Left leg swelling for 3 days EXAM: CHEST  2 VIEW COMPARISON:  06/26/2016 FINDINGS: There is cardiomegaly with mild central congestion. No large effusion or focal consolidation. No pneumothorax. IMPRESSION: Cardiomegaly with slight central congestion. No acute infiltrate or edema. Electronically Signed   By: Deborah Ho M.D.   On: 01/28/2017 19:02     CBC  Recent Labs Lab 01/28/17 1455 01/29/17 0527 01/30/17 0446 01/31/17 0514 02/04/17 0407  WBC 9.8 11.6* 10.9* 10.2 8.9  HGB 15.2* 14.6 14.1 14.0 15.5*  HCT 48.4* 46.8* 45.1 44.6 49.0*  PLT 292 291 287 306 337  MCV 90.6 90.5 89.3 89.2 89.9  MCH 28.5 28.2 27.9 28.0 28.4  MCHC 31.4 31.2 31.3 31.4 31.6  RDW 15.2 15.4 15.3 15.2 15.3  LYMPHSABS 1.6  --   --   --   --   MONOABS 0.7  --   --   --   --   EOSABS 0.1  --   --   --   --   BASOSABS 0.0  --   --   --   --     Chemistries   Recent Labs Lab 01/28/17 1455 01/28/17 1705  01/30/17 0446 01/31/17 0514 02/01/17 0450 02/02/17 0354 02/03/17 0458 02/04/17 0407  NA 141  --   < > 138 136 137 137 136 137  K 3.2*  --   < > 4.2 4.1 3.7 4.1 4.7 4.6  CL 106  --   < >  99* 93* 96* 96* 97* 95*  CO2 25  --   < > 28 32 32 30 26 30   GLUCOSE 105*  --   < > 130* 92 100* 102* 97 123*  BUN 18  --   < > 20 24* 24* 23* 24* 28*  CREATININE 1.19*  --   < > 1.35* 1.43* 1.31* 1.33* 1.20*  1.43*  CALCIUM 9.1  --   < > 9.5 9.2 9.1 9.0 9.3 9.4  MG  --  2.0  --   --   --   --   --   --   --   AST 29  --   --  26 26  --  22  --   --   ALT 20  --   --  17 14  --  11*  --   --   ALKPHOS 220*  --   --  222* 218*  --  183*  --   --   BILITOT 2.6*  --   --  1.5* 1.5*  --  1.2  --   --   < > = values in this interval not displayed. ------------------------------------------------------------------------------------------------------------------ estimated creatinine clearance is 41.9 mL/min (A) (by C-G formula based on SCr of 1.43 mg/dL (H)). ------------------------------------------------------------------------------------------------------------------ No results for input(s): HGBA1C in the last 72 hours. ------------------------------------------------------------------------------------------------------------------ No results for input(s): CHOL, HDL, LDLCALC, TRIG, CHOLHDL, LDLDIRECT in the last 72 hours. ------------------------------------------------------------------------------------------------------------------ No results for input(s): TSH, T4TOTAL, T3FREE, THYROIDAB in the last 72 hours.  Invalid input(s): FREET3 ------------------------------------------------------------------------------------------------------------------ No results for input(s): VITAMINB12, FOLATE, FERRITIN, TIBC, IRON, RETICCTPCT in the last 72 hours.  Coagulation profile No results for input(s): INR, PROTIME in the last 168 hours.  No results for input(s): DDIMER in the last 72 hours.  Cardiac Enzymes  Recent Labs Lab 01/28/17 1705  TROPONINI <0.03   ------------------------------------------------------------------------------------------------------------------ Invalid input(s):  POCBNP   CBG: No results for input(s): GLUCAP in the last 168 hours.     Studies: No results found.    Lab Results  Component Value Date   HGBA1C 5.8 (H) 05/04/2014   Lab Results  Component Value Date   LDLCALC 142 (H) 03/03/2016   CREATININE 1.43 (H) 02/04/2017       Scheduled Meds: . atorvastatin  80 mg Oral q1800  . carvedilol  3.125 mg Oral BID WC  . cetirizine  10 mg Oral Daily  . enoxaparin (LOVENOX) injection  40 mg Subcutaneous Q24H  . ferrous sulfate  325 mg Oral Once per day on Tue Thu  . potassium chloride  40 mEq Oral BID  . sacubitril-valsartan  1 tablet Oral BID  . spironolactone  25 mg Oral Daily  . ticagrelor  60 mg Oral BID  . torsemide  60 mg Oral Daily  . vancomycin  1,250 mg Intravenous Q24H   Continuous Infusions:   LOS: 7 days    Time spent: >30 MINS    [facility]  [facility] Pager [phone]. If 7PM-7AM, please contact night-coverage at [email or web address], password [facility] 02/04/2017, 8:24 AM  LOS: 7 days

## 2017-02-05 ENCOUNTER — Inpatient Hospital Stay (HOSPITAL_COMMUNITY): Payer: Medicare Other

## 2017-02-05 DIAGNOSIS — L03116 Cellulitis of left lower limb: Secondary | ICD-10-CM

## 2017-02-05 LAB — COMPREHENSIVE METABOLIC PANEL
ALT: 14 U/L (ref 14–54)
AST: 30 U/L (ref 15–41)
Albumin: 2.8 g/dL — ABNORMAL LOW (ref 3.5–5.0)
Alkaline Phosphatase: 205 U/L — ABNORMAL HIGH (ref 38–126)
Anion gap: 12 (ref 5–15)
BUN: 30 mg/dL — ABNORMAL HIGH (ref 6–20)
CO2: 26 mmol/L (ref 22–32)
Calcium: 9.1 mg/dL (ref 8.9–10.3)
Chloride: 97 mmol/L — ABNORMAL LOW (ref 101–111)
Creatinine, Ser: 1.26 mg/dL — ABNORMAL HIGH (ref 0.44–1.00)
GFR calc Af Amer: 51 mL/min — ABNORMAL LOW (ref >60)
GFR calc non Af Amer: 44 mL/min — ABNORMAL LOW (ref >60)
Glucose, Bld: 121 mg/dL — ABNORMAL HIGH (ref 65–99)
Potassium: 5 mmol/L (ref 3.5–5.1)
Sodium: 135 mmol/L (ref 135–145)
Total Bilirubin: 1.1 mg/dL (ref 0.3–1.2)
Total Protein: 7 g/dL (ref 6.5–8.1)

## 2017-02-05 LAB — CBC
HCT: 48.1 % — ABNORMAL HIGH (ref 36.0–46.0)
Hemoglobin: 15 g/dL (ref 12.0–15.0)
MCH: 27.8 pg (ref 26.0–34.0)
MCHC: 31.2 g/dL (ref 30.0–36.0)
MCV: 89.2 fL (ref 78.0–100.0)
Platelets: 348 10*3/uL (ref 150–400)
RBC: 5.39 MIL/uL — ABNORMAL HIGH (ref 3.87–5.11)
RDW: 15.2 % (ref 11.5–15.5)
WBC: 9.4 10*3/uL (ref 4.0–10.5)

## 2017-02-05 MED ORDER — POTASSIUM CHLORIDE CRYS ER 20 MEQ PO TBCR: 40.0000 meq | EXTENDED_RELEASE_TABLET | Freq: Every day | ORAL | Status: DC

## 2017-02-05 MED ORDER — POTASSIUM CHLORIDE CRYS ER 20 MEQ PO TBCR: 20.0000 meq | EXTENDED_RELEASE_TABLET | Freq: Every day | ORAL | Status: DC

## 2017-02-05 NOTE — Progress Notes (Signed)
Patient ID: Deborah Ho, female   DOB: Apr 27, 1951, 66 y.o.   MRN: [id number]   SUBJECTIVE: Admitted 01/28/17 with marked volume overload. Started on IV diuresis - 80mg  Lasix q 8 hours, had one dose of metolazone 2.5 mg. Weight down 30 pounds since admission. -16.4 L total.   Entresto 24/26 mg bid started 3/19. Tolerating well, no dizziness.   No complaints this am. Denies lightheadedness or dizziness. No SOB ambulating in room.   Started IV Vanc 02/03/17 for LLE cellulitis.  Arterial dopplers pending.   Scheduled Meds: . atorvastatin  80 mg Oral q1800  . carvedilol  6.25 mg Oral BID WC  . cetirizine  10 mg Oral Daily  . enoxaparin (LOVENOX) injection  40 mg Subcutaneous Q24H  . ferrous sulfate  325 mg Oral Once per day on Tue Thu  . potassium chloride  40 mEq Oral BID  . sacubitril-valsartan  1 tablet Oral BID  . spironolactone  25 mg Oral Daily  . ticagrelor  60 mg Oral BID  . torsemide  60 mg Oral Daily  . vancomycin  1,250 mg Intravenous Q24H   Continuous Infusions: PRN Meds:.acetaminophen **OR** acetaminophen, morphine injection, traMADol    Vitals:   02/04/17 2102 02/05/17 0344 02/05/17 0600 02/05/17 0830  BP: 108/65 110/67  100/75  Pulse: 94 93  85  Resp: 20 18    Temp: 98.2 F (36.8 C) 97.7 F (36.5 C)    TempSrc: Oral Oral    SpO2: 99% 99%    Weight:   200 lb 9.6 oz (91 kg)   Height:        Intake/Output Summary (Last 24 hours) at 02/05/17 0840 Last data filed at 02/05/17 0600  Gross per 24 hour  Intake             1290 ml  Output              500 ml  Net              790 ml    LABS: Basic Metabolic Panel:  Recent Labs  02/07/17 0407 02/05/17 0236  NA 137 135  K 4.6 5.0  CL 95* 97*  CO2 30 26  GLUCOSE 123* 121*  BUN 28* 30*  CREATININE 1.43* 1.26*  CALCIUM 9.4 9.1   Liver Function Tests:  Recent Labs  02/05/17 0236  AST 30  ALT 14  ALKPHOS 205*  BILITOT 1.1  PROT 7.0  ALBUMIN 2.8*    RADIOLOGY: Dg Chest 2 View  Result Date:  01/28/2017 CLINICAL DATA:  Left leg swelling for 3 days EXAM: CHEST  2 VIEW COMPARISON:  06/26/2016 FINDINGS: There is cardiomegaly with mild central congestion. No large effusion or focal consolidation. No pneumothorax. IMPRESSION: Cardiomegaly with slight central congestion. No acute infiltrate or edema. Electronically Signed   By: 08/26/2016 M.D.   On: 01/28/2017 19:02    PHYSICAL EXAM General: NAD. Seated in chair.   Neck: Supple, No JVP.  Lungs: Clear, normal effort.  CV:  Heart regular S1/S2, no S3/S4, no murmur.  Abdomen: Soft, NT, ND, no HSM. No bruits or masses. +BS  Neurologic: Alert & oriented x 3. Cranial nerves grossly intact. Moves all 4 extremities w/o difficulty. Affect flat but appropriate. Extremities: No clubbing or cyanosis. Extremities warm. LLE with weeping ulcer.   TELEMETRY: Personally reviewed, NSR.   ASSESSMENT AND PLAN: 66 yo with history of CAD s/p anterior MI, ischemic cardiomyopathy/chronic systolic CHF, and noncompliance with followup presented with acute  on chronic systolic CHF, marked volume overload.   1. Acute on chronic systolic CHF, NYHA II: She has been noncompliant with followup but says that she has been taking her meds. Echo this admission shows EF 20-25%, moderately dilated RV with moderately decreased systolic function.  - Volume status stable. BP stable.  - Continue torsemide 60 mg daily.   - Continue Entresto 24/26 mg BID. Will not increase for now with relatively soft pressures and borderline hyperkalemia. - Continue Coreg 6.25mg  BID -  Continue spiro 25 mg daily.   - Decrease potassium supp to 40 meq DAILY.  2. CKD: Stage II-III. - Stable.  - Creatinine 1.35->1.43->1.31->1.33->1.20->1.43 -> 1.26.  3. CAD: Anterior STEMI in 6/15 with PCI, did not take ticagrelor and had stent thrombosis shortly afterwards.   - No chest pain. - Continue ticagrelor,and statin, allergic to aspirin.   4. Left lower leg cellulitis:  - Started on IV Vanc. -  Arterial US pending.   5. Noncompliance - Stressed importance of medication compliance and outpatient follow up.  6. Hyperkalemia - Borderline. On Spiro and Entresto.  Decrease daily K supp.  - Continue to follow closely.    Deborah Freer, PA-C  02/05/2017 8:40 AM   Advanced Heart Failure Team  Pager [phone] M-F 7am-4pm.  Please contact [facility] for night-coverage after hours (4p -7a ) and weekends on [email or web address]  Patient seen with PA, agree with the above note.  She appears stable today from cardiac standpoint.  No BP room to uptitrate meds any further.  Creatinine stable.  Volume status looks good.   Remains on vancomycin for cellulitis, getting arterial dopplers.    From my standpoint, she could go home.  We will arrange followup in CHF clinic.  Cardiac meds for home: atorvastatin 80 daily, Coreg 6.25 bid, Entresto 24/26 bid, KCl 20 daily, spironolactone 25 daily, torsemide 60 daily, ticagrelor 60 bid.   Deborah Ho 02/05/2017 10:57 AM

## 2017-02-05 NOTE — Progress Notes (Signed)
Pharmacy Antibiotic Note  Deborah Ho is a 66 y.o. female with worsening LE cellulitis on cefazolin, now to broaden to vancomycin. Renal function stable.  No fevers overnight, wbc normal, no culture data. Cellulitis improving per primary team. Will check level in am.   Vancomycin trough goal 10-15  Plan: 1) Vancomycin 1250mg  IV q24 2) Check trough level in the morning  Height: 5\' 3"  (160 cm) Weight: 200 lb 9.6 oz (91 kg) IBW/kg (Calculated) : 52.4  Temp (24hrs), Avg:97.8 F (36.6 C), Min:97.4 F (36.3 C), Max:98.2 F (36.8 C)   Recent Labs Lab 01/30/17 0446 01/31/17 0514 02/01/17 0450 02/02/17 0354 02/03/17 0458 02/04/17 0407 02/05/17 0236  WBC 10.9* 10.2  --   --   --  8.9 9.4  CREATININE 1.35* 1.43* 1.31* 1.33* 1.20* 1.43* 1.26*    Estimated Creatinine Clearance: 47.6 mL/min (A) (by C-G formula based on SCr of 1.26 mg/dL (H)).    Allergies  Allergen Reactions  . Aspirin Swelling    Chewable children's aspirin makes patients tongue and face swell  . Effient [Prasugrel] Swelling    Patient's tongue and face swells  . Entresto [Sacubitril-Valsartan] Other (See Comments)    dizziness  . Lactose Intolerance (Gi) Other (See Comments)    REACTION: stomach upset  . Robitussin Dm [Guaifenesin-Dm] Swelling    Patient's tongue swells  . Sulfa Antibiotics Swelling  . Wheat Bran Other (See Comments)    REACTION: unknown    Antimicrobials this admission: Cefazolin 3/16 >> 3/21 Vancomycin 3/21 >>  Dose adjustments this admission: n/a  Microbiology results: n/a  Thank you for allowing pharmacy to be a part of this patient's care.  4/21 PharmD., BCPS Clinical Pharmacist Pager [phone] 02/05/2017 1:30 PM

## 2017-02-05 NOTE — Progress Notes (Signed)
VASCULAR LAB PRELIMINARY  ARTERIAL  ABI completed: Right ABI of 1.09 and left ABI of 1.17 are suggestive of arterial flow within normal limits at rest. Previous ABI's obtained on 06/28/16 were 1.09 on the right and 1.2 on the left.   RIGHT    LEFT    PRESSURE WAVEFORM  PRESSURE WAVEFORM  BRACHIAL 99 Triphasic BRACHIAL 105 Triphasic  DP 114 Biphasic DP 95 Monophasic  PT 107 Biphasic PT 123 Biphasic    RIGHT LEFT  ABI 1.09 1.17     Elsie Stain, RVT 02/05/2017, 12:54 PM

## 2017-02-05 NOTE — Progress Notes (Signed)
[facility] PROGRESS NOTE  Deborah Ho GOV:[id number] DOB: 1951/09/02 DOA: 01/28/2017   PCP: Grayce Sessions, NP     Assessment/Plan: Deborah Ho is a 66 y.o. female with a past medical history significant for CHF EF 15%, medical noncompliance, HTN, and CAD s/p PCI/STEMI who presents with gradual leg swelling and pain and weeping.admitted for cellulitis of the left lower extremity and CHF exacerbation, diuresed and 30L negative  Assessment and plan 1. Acute on chronic systolic   CHF: history of chronic systolic CHF last EF was 15% in April 2017.Echo this admission shows EF 20-25%, moderately dilated RV with moderately decreased systolic function Leg swelling bilaterally, weeping, poor medication knowledge, poor insight,  noncompliance, she stopped outpatient BB, ACEi, spiro, hydral/nitrates,  and noncompliant with cardiology follow-ups -diuresed aggressively by Cardiology, weight down 30lbs -negative 16L -IV lasix changed over to torsemide 60 daily.  - Continue Entresto 24/26 mg BID, Coreg, Spironolactone per Cards -creatinine stable now   2. Leg lower extremity cellulitis - Dopplers negative for DVT, was On cefazolin, with minimal improvement in her cellulitis -Dr.ABrol changed this to IV vancomycin, now d3, improving -Arterial dopplers ordered by Cards pending  3. Hypokalemia: resolved  -K 5, will stop KCL  4. Elevated LFTs: resolved ,Suspect hepatic congestion.     5. Hypertension:  Continue  meds as above for heart failure   6. Coronary artery disease:  -h/o DES to LAD in 04/2014 with readmission the following week for acute in-stent thrombosis -Continue statin and Brilinta, stable now  7. chronic CKD stage III:  Baseline creatinine is 1.5-1.6  -stable  DVT prophylaxsis Lovenox Code Status:  Full code  Family Communication: Discussed in detail with the patient, all imaging results, lab results explained to the patient  Disposition Plan:    Home tomorrow      Consultants:  Cardiology  Procedures: None  Antibiotics: Anti-infectives    Start     Dose/Rate Route Frequency Ordered Stop   02/03/17 1000  vancomycin (VANCOCIN) 1,250 mg in sodium chloride 0.9 % 250 mL IVPB     1,250 mg 166.7 mL/hr over 90 Minutes Intravenous Every 24 hours 02/03/17 0914     01/29/17 1400  ceFAZolin (ANCEF) IVPB 1 g/50 mL premix  Status:  Discontinued     1 g 100 mL/hr over 30 Minutes Intravenous Every 8 hours 01/29/17 1159 02/03/17 0847   01/28/17 1715  ceFAZolin (ANCEF) IVPB 1 g/50 mL premix     1 g 100 mL/hr over 30 Minutes Intravenous  Once 01/28/17 1707 01/28/17 1814         HPI/Subjective: Breathing better, upset abt slowing urine output  Objective: Vitals:   02/04/17 2102 02/05/17 0344 02/05/17 0600 02/05/17 0830  BP: 108/65 110/67  100/75  Pulse: 94 93  85  Resp: 20 18    Temp: 98.2 F (36.8 C) 97.7 F (36.5 C)    TempSrc: Oral Oral    SpO2: 99% 99%    Weight:   91 kg (200 lb 9.6 oz)   Height:        Intake/Output Summary (Last 24 hours) at 02/05/17 1154 Last data filed at 02/05/17 1000  Gross per 24 hour  Intake              800 ml  Output              750 ml  Net  50 ml    Exam:  Examination:  General exam: Appears calm and comfortable  Neck: JVP 10 cm Lungs: Clear to auscultation bilaterally with normal respiratory effort. CV: Nondisplaced PMI.  Heart regular S1/S2, no S3/S4, no murmur.  1plus edema, LLE>R   Abdomen: Soft, nontender, no hepatosplenomegaly, mild distention.  Neurologic: Alert and oriented x 3.  Psych: Normal affect. Extremities: Erythema /warmth/tenderness of the left lower leg with blister   Data Reviewed: I have personally reviewed following labs and imaging studies  Micro Results No results found for this or any previous visit (from the past 240 hour(s)).  Radiology Reports Dg Chest 2 View  Result Date: 01/28/2017 CLINICAL DATA:  Left leg swelling for 3 days  EXAM: CHEST  2 VIEW COMPARISON:  06/26/2016 FINDINGS: There is cardiomegaly with mild central congestion. No large effusion or focal consolidation. No pneumothorax. IMPRESSION: Cardiomegaly with slight central congestion. No acute infiltrate or edema. Electronically Signed   By: Jasmine Pang M.D.   On: 01/28/2017 19:02     CBC  Recent Labs Lab 01/30/17 0446 01/31/17 0514 02/04/17 0407 02/05/17 0236  WBC 10.9* 10.2 8.9 9.4  HGB 14.1 14.0 15.5* 15.0  HCT 45.1 44.6 49.0* 48.1*  PLT 287 306 337 348  MCV 89.3 89.2 89.9 89.2  MCH 27.9 28.0 28.4 27.8  MCHC 31.3 31.4 31.6 31.2  RDW 15.3 15.2 15.3 15.2    Chemistries   Recent Labs Lab 01/30/17 0446 01/31/17 0514 02/01/17 0450 02/02/17 0354 02/03/17 0458 02/04/17 0407 02/05/17 0236  NA 138 136 137 137 136 137 135  K 4.2 4.1 3.7 4.1 4.7 4.6 5.0  CL 99* 93* 96* 96* 97* 95* 97*  CO2 28 32 32 30 26 30 26   GLUCOSE 130* 92 100* 102* 97 123* 121*  BUN 20 24* 24* 23* 24* 28* 30*  CREATININE 1.35* 1.43* 1.31* 1.33* 1.20* 1.43* 1.26*  CALCIUM 9.5 9.2 9.1 9.0 9.3 9.4 9.1  AST 26 26  --  22  --   --  30  ALT 17 14  --  11*  --   --  14  ALKPHOS 222* 218*  --  183*  --   --  205*  BILITOT 1.5* 1.5*  --  1.2  --   --  1.1   ------------------------------------------------------------------------------------------------------------------ estimated creatinine clearance is 47.6 mL/min (A) (by C-G formula based on SCr of 1.26 mg/dL (H)). ------------------------------------------------------------------------------------------------------------------ No results for input(s): HGBA1C in the last 72 hours. ------------------------------------------------------------------------------------------------------------------ No results for input(s): CHOL, HDL, LDLCALC, TRIG, CHOLHDL, LDLDIRECT in the last 72 hours. ------------------------------------------------------------------------------------------------------------------ No results for  input(s): TSH, T4TOTAL, T3FREE, THYROIDAB in the last 72 hours.  Invalid input(s): FREET3 ------------------------------------------------------------------------------------------------------------------ No results for input(s): VITAMINB12, FOLATE, FERRITIN, TIBC, IRON, RETICCTPCT in the last 72 hours.  Coagulation profile No results for input(s): INR, PROTIME in the last 168 hours.  No results for input(s): DDIMER in the last 72 hours.  Cardiac Enzymes No results for input(s): CKMB, TROPONINI, MYOGLOBIN in the last 168 hours.  Invalid input(s): CK ------------------------------------------------------------------------------------------------------------------ Invalid input(s): POCBNP   CBG: No results for input(s): GLUCAP in the last 168 hours.     Studies: No results found.    Lab Results  Component Value Date   HGBA1C 5.8 (H) 05/04/2014   Lab Results  Component Value Date   LDLCALC 142 (H) 03/03/2016   CREATININE 1.26 (H) 02/05/2017       Scheduled Meds: . atorvastatin  80 mg Oral q1800  . carvedilol  6.25  mg Oral BID WC  . cetirizine  10 mg Oral Daily  . enoxaparin (LOVENOX) injection  40 mg Subcutaneous Q24H  . ferrous sulfate  325 mg Oral Once per day on Tue Thu  . [START ON 02/06/2017] potassium chloride  20 mEq Oral Daily  . sacubitril-valsartan  1 tablet Oral BID  . spironolactone  25 mg Oral Daily  . ticagrelor  60 mg Oral BID  . torsemide  60 mg Oral Daily  . vancomycin  1,250 mg Intravenous Q24H   Continuous Infusions:   LOS: 8 days    Time spent: >30 MINS    [facility]  [facility] Pager [phone]. If 7PM-7AM, please contact night-coverage at [email or web address], password [facility] 02/05/2017, 11:54 AM  LOS: 8 days

## 2017-02-06 LAB — VANCOMYCIN, TROUGH: Vancomycin Tr: 18 ug/mL (ref 15–20)

## 2017-02-06 MED ORDER — CARVEDILOL 6.25 MG PO TABS: 6.2500 mg | ORAL_TABLET | Freq: Two times a day (BID) | ORAL | 0 refills | Status: DC

## 2017-02-06 MED ORDER — CEPHALEXIN 250 MG PO CAPS: 250.0000 mg | ORAL_CAPSULE | Freq: Four times a day (QID) | ORAL | 0 refills | Status: DC

## 2017-02-06 MED ORDER — SPIRONOLACTONE 25 MG PO TABS: 25.0000 mg | ORAL_TABLET | Freq: Every day | ORAL | 0 refills | Status: DC

## 2017-02-06 MED ORDER — TORSEMIDE 20 MG PO TABS: 60.0000 mg | ORAL_TABLET | Freq: Every day | ORAL | 0 refills | Status: DC

## 2017-02-06 MED ORDER — SACUBITRIL-VALSARTAN 24-26 MG PO TABS: 1.0000 | ORAL_TABLET | Freq: Two times a day (BID) | ORAL | 0 refills | Status: DC

## 2017-02-06 MED ORDER — VANCOMYCIN HCL IN DEXTROSE 750-5 MG/150ML-% IV SOLN: 750.0000 mg | INTRAVENOUS | Status: DC

## 2017-02-06 NOTE — Discharge Summary (Addendum)
Physician Discharge Summary  Deborah Ho TZG:[id number] DOB: 1951-09-15 DOA: 01/28/2017  PCP: Grayce Sessions, NP  Admit date: 01/28/2017 Discharge date: 02/06/2017  Time spent: 45 minutes  Recommendations for Outpatient Follow-up:  1. CHF clinic on 4/5 with Alejandro Mulling, please check Bmet at follow up   Discharge Diagnoses:  Principal Problem:   Acute on chronic systolic and diastolic heart failure, NYHA class 1 (HCC) Active Problems:   Elevated LFTs   Coronary atherosclerosis of native coronary artery   Essential hypertension   CKD (chronic kidney disease), stage III   Hypokalemia   Cellulitis of left lower leg   Discharge Condition: improved  Diet recommendation: low sodium heart healthy  Filed Weights   02/04/17 0619 02/05/17 0600 02/06/17 0558  Weight: 90.7 kg (199 lb 14.4 oz) 91 kg (200 lb 9.6 oz) 89.9 kg (198 lb 1.6 oz)    History of present illness:  Deborah Ho a 66 y.o.femalewith a past medical history significant for CHF EF 15%, medical noncompliance, HTN, and CAD s/p PCI/STEMIwho presented with gradual leg swelling and pain and weeping.admitted for cellulitis of the left lower extremity and CHF exacerbation,  Hospital Course:  Assessment and plan 1. Acute on chronic systolic   PRF:FMBWGYK of chronic systolic CHF last EF was 15% in April 2017.Echo this admission shows EF 20-25%, moderately dilated RV with moderately decreased systolic function -Leg swelling bilaterally, weeping, poor medication knowledge, poor insight,  noncompliance, she stopped outpatient BB, ACEi, spiro, hydral/nitrates,  and noncompliant with cardiology follow-ups -diuresed aggressively by Cardiology, weight down 30lbs, negative 16L -IV lasix changed over to torsemide 60 daily.  - Continue Entresto 24/26 mg BID, Coreg, Spironolactone per Cards -creatinine stable now, discharged home in a stable condition and made FU with CHF team 4/5   2. Leg lower extremity cellulitis -  Dopplers negative for DVT, treated with cefazolin initially, with minimal improvement in her cellulitis - Dr.ABrol changed this to IV vancomycin, now d3, improving - Arterial dopplers ordered by Cards unremarkable -improving, discharged on Po keflex  3. Hypokalemia: resolved   4. Elevated LFTs: resolved ,Suspect hepatic congestion.     5. Hypertension: Continue  meds as above for heart failure   6. Coronary artery disease: -h/o DES to LAD in 04/2014 with readmission the following week for acute in-stent thrombosis -Continue statin and Brilinta, stable now  7. chronic CKD stage III:  Baseline creatinine is 1.5-1.6  -stable    Consultants:  Cardiology  Discharge Exam: Vitals:   02/06/17 0757 02/06/17 1000  BP:  120/66  Pulse: 94 92  Resp:    Temp:      General: AAOx3 Cardiovascular: S1S2/RRR Respiratory: CTAB  Discharge Instructions   Discharge Instructions    Diet - low sodium heart healthy    Complete by:  As directed    Increase activity slowly    Complete by:  As directed      Discharge Medication List as of 02/06/2017  2:11 PM    START taking these medications   Details  carvedilol (COREG) 6.25 MG tablet Take 1 tablet (6.25 mg total) by mouth 2 (two) times daily with a meal., Starting Sat 02/06/2017, Print    cephALEXin (KEFLEX) 250 MG capsule Take 1 capsule (250 mg total) by mouth 4 (four) times daily. For 4days, Starting Sat 02/06/2017, Print    sacubitril-valsartan (ENTRESTO) 24-26 MG Take 1 tablet by mouth 2 (two) times daily., Starting Sat 02/06/2017, Print    spironolactone (ALDACTONE) 25 MG tablet Take 1  tablet (25 mg total) by mouth daily., Starting Sat 02/06/2017, Print    torsemide (DEMADEX) 20 MG tablet Take 3 tablets (60 mg total) by mouth daily., Starting Sat 02/06/2017, Print      CONTINUE these medications which have NOT CHANGED   Details  atorvastatin (LIPITOR) 80 MG tablet Take 1 tablet (80 mg total) by mouth daily at 6 PM.,  Starting Mon 07/06/2016, Normal    cetirizine (ZYRTEC) 10 MG tablet Take 10 mg by mouth daily as needed for allergies. , Historical Med    ferrous sulfate 325 (65 FE) MG tablet Take 325 mg by mouth 2 (two) times a week. tues, thurs, Historical Med    nitroGLYCERIN (NITROSTAT) 0.4 MG SL tablet Place 0.4 mg under the tongue every 5 (five) minutes as needed for chest pain., Until Discontinued, Historical Med    ticagrelor (BRILINTA) 60 MG TABS tablet Take 1 tablet (60 mg total) by mouth 2 (two) times daily., Starting Wed 07/22/2016, No Print      STOP taking these medications     furosemide (LASIX) 40 MG tablet      potassium chloride (K-DUR,KLOR-CON) 10 MEQ tablet        Allergies  Allergen Reactions  . Aspirin Swelling    Chewable children's aspirin makes patients tongue and face swell  . Effient [Prasugrel] Swelling    Patient's tongue and face swells  . Entresto [Sacubitril-Valsartan] Other (See Comments)    dizziness  . Lactose Intolerance (Gi) Other (See Comments)    REACTION: stomach upset  . Robitussin Dm [Guaifenesin-Dm] Swelling    Patient's tongue swells  . Sulfa Antibiotics Swelling  . Wheat Bran Other (See Comments)    REACTION: unknown   Follow-up Information    [facility] Follow up.   Why:  A home health care nurse will go to your home Contact information: [address] [place] [address] [phone]        Graciella Freer, PA-C Follow up on 02/18/2017.   Specialty:  Physician Assistant Why:  at 2:00 for hospital follow up with Dr. Alford Highland PA.  Contact information: [address] [place] [address] [phone]            The results of significant diagnostics from this hospitalization (including imaging, microbiology, ancillary and laboratory) are listed below for reference.    Significant Diagnostic Studies: Dg Chest 2 View  Result Date: 01/28/2017 CLINICAL DATA:  Left leg swelling for 3 days EXAM: CHEST   2 VIEW COMPARISON:  06/26/2016 FINDINGS: There is cardiomegaly with mild central congestion. No large effusion or focal consolidation. No pneumothorax. IMPRESSION: Cardiomegaly with slight central congestion. No acute infiltrate or edema. Electronically Signed   By: Jasmine Pang M.D.   On: 01/28/2017 19:02    Microbiology: No results found for this or any previous visit (from the past 240 hour(s)).   Labs: Basic Metabolic Panel:  Recent Labs Lab 02/02/17 0354 02/03/17 0458 02/04/17 0407 02/05/17 0236  NA 137 136 137 135  K 4.1 4.7 4.6 5.0  CL 96* 97* 95* 97*  CO2 30 26 30 26   GLUCOSE 102* 97 123* 121*  BUN 23* 24* 28* 30*  CREATININE 1.33* 1.20* 1.43* 1.26*  CALCIUM 9.0 9.3 9.4 9.1   Liver Function Tests:  Recent Labs Lab 02/02/17 0354 02/05/17 0236  AST 22 30  ALT 11* 14  ALKPHOS 183* 205*  BILITOT 1.2 1.1  PROT 6.1* 7.0  ALBUMIN 2.9* 2.8*   No  results for input(s): LIPASE, AMYLASE in the last 168 hours. No results for input(s): AMMONIA in the last 168 hours. CBC:  Recent Labs Lab 02/04/17 0407 02/05/17 0236  WBC 8.9 9.4  HGB 15.5* 15.0  HCT 49.0* 48.1*  MCV 89.9 89.2  PLT 337 348   Cardiac Enzymes: No results for input(s): CKTOTAL, CKMB, CKMBINDEX, TROPONINI in the last 168 hours. BNP: BNP (last 3 results)  Recent Labs  06/26/16 1441 08/06/16 1600 01/28/17 2047  BNP 1,443.5* 263.7* 1,920.0*    ProBNP (last 3 results) No results for input(s): PROBNP in the last 8760 hours.  CBG: No results for input(s): GLUCAP in the last 168 hours.     SignedZannie Cove MD.  [facility] 02/08/2017, 3:30 PM

## 2017-02-06 NOTE — Progress Notes (Signed)
Patient informed she was being discharged, patient states she cannot leave until "we" get her medications straightened out.  Wanted me to go over her medication list from the last 3 discharges and tell her what medications had changed.  I did print out discharge summary for today, 06/2016 and 02/2016 all which said the same thing, do not take furosemide, take Spironolactone, Torsemide, Brillinta and Entresto.  Went over doses of these medications with patient for the last 3 admissions.  Patient stated she did not have spironolactone and did not understand why.  I told her I did not know why either since her DC summary back in August said it was sent electronically to [facility] pharmacy.  I called [facility] pharmacy who stated Spironolactone was never picked up by the patient.  I informed Deborah Ho of this.

## 2017-02-06 NOTE — Progress Notes (Signed)
Patient continues to question medications when RN goes into the room.  Asked why she was on the Brilinta, we discussed indication for this as well as dosage of Entresto.  I did read Dr. Alford Highland note from yesterday where he stated what medications she was to take with the dosages.

## 2017-02-06 NOTE — Care Management Note (Signed)
Case Management Note  Patient Details  Name: Deborah Ho MRN: [id number] Date of Birth: October 06, 1951  Subjective/Objective:                  worsening LE cellulitis Action/Plan: Discharge planning Expected Discharge Date:  02/06/17               Expected Discharge Plan:  Home w Home Health Services  In-House Referral:     Discharge planning Services  CM Consult  Post Acute Care Choice:  Home Health Choice offered to:  Patient  DME Arranged:  N/A DME Agency:  NA  HH Arranged:  RN HH Agency:  [facility]  Status of Service:  Completed, signed off  If discussed at Long Length of Stay Meetings, dates discussed:    Additional Comments: CM confirms with pt choice for [facility] is [facility]; pt confirms.  CM called [facility] rep, Jermaine with referral.  No other CM needs were communicated. Yves Dill, RN 02/06/2017, 12:41 PM

## 2017-02-06 NOTE — Progress Notes (Signed)
Upon entering room patient continues to ask the same questions about her medications.  She says she remembered that she did not get the spironolactone because the nurses in the hospital had to check her BP before giving it and sometimes had to hold it because her BP was too low.  I informed the patient that often in the hospital we may have given IV Lasix or other medications that would lower BP but this would not be the same at home.  Patient states she needs a home health nurse to come every other day and check her blood pressure.  I told her that home health is unlikely to come every other day but may come out once or twice a week to check her BP.    I text paged Dr. Jomarie Longs and expressed concern that patient had MANY questions regarding her medications, Dr. Jomarie Longs said she had gone over each medication and dose with patient today and yesterday.  Home Health ordered, RN called case management and left message.

## 2017-02-06 NOTE — Progress Notes (Signed)
Pharmacy Antibiotic Note  Deborah Ho is a 66 y.o. female with worsening LE cellulitis on cefazolin, broadened to vancomycin on 3/21. Renal function stable with SCr 1.26, CrCl ~47 -No fevers overnight, wbc normal, no culture data. Cellulitis improving per primary team. -Vancomycin trough goal 10-15  3/24 VT = 18, supratherapeutic for cellulitis.  Plan: 1) Decrease vancomycin to 750mg  IV q24. Chose lower dose of 750 instead of 1000 even though both may be in range because patient is currently high and already received 1250mg  dose on 3/24.  2) Monitor renal function, trough at steady state. 3) F/u LOT  Height: 5\' 3"  (160 cm) Weight: 198 lb 1.6 oz (89.9 kg) IBW/kg (Calculated) : 52.4  Temp (24hrs), Avg:98 F (36.7 C), Min:97.4 F (36.3 C), Max:98.6 F (37 C)   Recent Labs Lab 01/31/17 0514 02/01/17 0450 02/02/17 0354 02/03/17 0458 02/04/17 0407 02/05/17 0236 02/06/17 0931  WBC 10.2  --   --   --  8.9 9.4  --   CREATININE 1.43* 1.31* 1.33* 1.20* 1.43* 1.26*  --   VANCOTROUGH  --   --   --   --   --   --  18    Estimated Creatinine Clearance: 47.4 mL/min (A) (by C-G formula based on SCr of 1.26 mg/dL (H)).    Allergies  Allergen Reactions  . Aspirin Swelling    Chewable children's aspirin makes patients tongue and face swell  . Effient [Prasugrel] Swelling    Patient's tongue and face swells  . Entresto [Sacubitril-Valsartan] Other (See Comments)    dizziness  . Lactose Intolerance (Gi) Other (See Comments)    REACTION: stomach upset  . Robitussin Dm [Guaifenesin-Dm] Swelling    Patient's tongue swells  . Sulfa Antibiotics Swelling  . Wheat Bran Other (See Comments)    REACTION: unknown    Antimicrobials this admission: Cefazolin 3/16 >> 3/21 Vancomycin 3/21 >>  Dose adjustments this admission: 3/24 VT = 18, supratherapeutic for cellulitis. Change 750mg  Q24H. Chose lower dose of 750 instead of 1000 even though both may be in range because patient is currently  high and already received 1250mg  dose on 3/24.   Microbiology results: n/a  Thank you for allowing pharmacy to be a part of this patient's care.  4/21 4/24), PharmD  PGY1 Pharmacy Resident Pager: [phone] 02/06/2017 10:47 AM

## 2017-02-06 NOTE — Progress Notes (Signed)
Discharge instructions reviewed with patient, reviewed each medication, what it was for, the dosage and when to take.  Handouts given for all medications.  Questions answered, verbalized understanding.  Patient given prescriptions for her medications including Keflex, stressed the importance of taking full course of this medication for her cellulitis, patient verbalizes understanding.  Patient transported to emergency room entrance where her car was parked to drive herself home.

## 2017-02-08 ENCOUNTER — Telehealth (HOSPITAL_COMMUNITY): Payer: Self-pay | Admitting: Surgery

## 2017-02-08 NOTE — Telephone Encounter (Signed)
Heart Failure Nurse Navigator Post Discharge Telephone Call  I called to check on Deborah Ho after her recent hospitalization. She tells me that she is feeling "well".  She does say that her leg has began to "weep" again and she plans to call her PCP regarding that.  I reminded her of her follow-up appt with the [facility] on 02/18/17.  She tells me that she has been taking her medications just as prescribed.  She says that her weight today was 200 lbs---and also says that she "does not feel that she is putting out urine" like in the hospital.  Her weight was 198 lbs on the day of discharge 02/06/17.  I explained that she will likely always "put out more" on IV medications/ inside the hospital.  I reviewed when to contact the clinic and the importance of daily weights.  I also encouraged her to call me back with any concerns or questions related to her HF.

## 2017-02-12 ENCOUNTER — Emergency Department (HOSPITAL_COMMUNITY)
Admission: EM | Admit: 2017-02-12 | Discharge: 2017-02-12 | Disposition: A | Discharge status: Home / Self Care | Payer: Medicare Other | Attending: Emergency Medicine | Admitting: Emergency Medicine

## 2017-02-12 ENCOUNTER — Encounter (HOSPITAL_COMMUNITY): Payer: Self-pay | Admitting: Emergency Medicine

## 2017-02-12 DIAGNOSIS — L97221 Non-pressure chronic ulcer of left calf limited to breakdown of skin: Secondary | ICD-10-CM | POA: Insufficient documentation

## 2017-02-12 DIAGNOSIS — I5043 Acute on chronic combined systolic (congestive) and diastolic (congestive) heart failure: Secondary | ICD-10-CM | POA: Insufficient documentation

## 2017-02-12 DIAGNOSIS — I130 Hypertensive heart and chronic kidney disease with heart failure and stage 1 through stage 4 chronic kidney disease, or unspecified chronic kidney disease: Secondary | ICD-10-CM | POA: Insufficient documentation

## 2017-02-12 DIAGNOSIS — Z79899 Other long term (current) drug therapy: Secondary | ICD-10-CM | POA: Diagnosis not present

## 2017-02-12 DIAGNOSIS — N183 Chronic kidney disease, stage 3 (moderate): Secondary | ICD-10-CM | POA: Diagnosis not present

## 2017-02-12 DIAGNOSIS — I252 Old myocardial infarction: Secondary | ICD-10-CM | POA: Insufficient documentation

## 2017-02-12 DIAGNOSIS — F17210 Nicotine dependence, cigarettes, uncomplicated: Secondary | ICD-10-CM | POA: Insufficient documentation

## 2017-02-12 DIAGNOSIS — L03116 Cellulitis of left lower limb: Secondary | ICD-10-CM | POA: Diagnosis present

## 2017-02-12 DIAGNOSIS — I2510 Atherosclerotic heart disease of native coronary artery without angina pectoris: Secondary | ICD-10-CM | POA: Insufficient documentation

## 2017-02-12 DIAGNOSIS — I872 Venous insufficiency (chronic) (peripheral): Secondary | ICD-10-CM

## 2017-02-12 LAB — CBC WITH DIFFERENTIAL/PLATELET
Basophils Absolute: 0.1 10*3/uL (ref 0.0–0.1)
Basophils Relative: 1 %
Eosinophils Absolute: 0.2 10*3/uL (ref 0.0–0.7)
Eosinophils Relative: 3 %
HCT: 47.3 % — ABNORMAL HIGH (ref 36.0–46.0)
Hemoglobin: 15 g/dL (ref 12.0–15.0)
Lymphocytes Relative: 21 %
Lymphs Abs: 1.8 10*3/uL (ref 0.7–4.0)
MCH: 28 pg (ref 26.0–34.0)
MCHC: 31.7 g/dL (ref 30.0–36.0)
MCV: 88.4 fL (ref 78.0–100.0)
Monocytes Absolute: 0.4 10*3/uL (ref 0.1–1.0)
Monocytes Relative: 5 %
Neutro Abs: 5.9 10*3/uL (ref 1.7–7.7)
Neutrophils Relative %: 70 %
Platelets: 467 10*3/uL — ABNORMAL HIGH (ref 150–400)
RBC: 5.35 MIL/uL — ABNORMAL HIGH (ref 3.87–5.11)
RDW: 15.6 % — ABNORMAL HIGH (ref 11.5–15.5)
WBC: 8.3 10*3/uL (ref 4.0–10.5)

## 2017-02-12 LAB — COMPREHENSIVE METABOLIC PANEL
ALT: 44 U/L (ref 14–54)
AST: 50 U/L — ABNORMAL HIGH (ref 15–41)
Albumin: 3.4 g/dL — ABNORMAL LOW (ref 3.5–5.0)
Alkaline Phosphatase: 189 U/L — ABNORMAL HIGH (ref 38–126)
Anion gap: 9 (ref 5–15)
BUN: 16 mg/dL (ref 6–20)
CO2: 22 mmol/L (ref 22–32)
Calcium: 9.6 mg/dL (ref 8.9–10.3)
Chloride: 112 mmol/L — ABNORMAL HIGH (ref 101–111)
Creatinine, Ser: 1.22 mg/dL — ABNORMAL HIGH (ref 0.44–1.00)
GFR calc Af Amer: 53 mL/min — ABNORMAL LOW (ref >60)
GFR calc non Af Amer: 45 mL/min — ABNORMAL LOW (ref >60)
Glucose, Bld: 100 mg/dL — ABNORMAL HIGH (ref 65–99)
Potassium: 4.3 mmol/L (ref 3.5–5.1)
Sodium: 143 mmol/L (ref 135–145)
Total Bilirubin: 1.1 mg/dL (ref 0.3–1.2)
Total Protein: 7.4 g/dL (ref 6.5–8.1)

## 2017-02-12 LAB — I-STAT CG4 LACTIC ACID, ED: Lactic Acid, Venous: 1.75 mmol/L (ref 0.5–1.9)

## 2017-02-12 MED ORDER — DOXYCYCLINE HYCLATE 100 MG PO CAPS: 100.0000 mg | ORAL_CAPSULE | Freq: Two times a day (BID) | ORAL | 0 refills | Status: DC

## 2017-02-12 MED ORDER — DOXYCYCLINE HYCLATE 100 MG PO TABS
100.0000 mg | ORAL_TABLET | Freq: Once | ORAL | Status: AC
  Administered 2017-02-12: 100 mg via ORAL
  Filled 2017-02-12: qty 1

## 2017-02-12 MED ORDER — HYDROCODONE-ACETAMINOPHEN 5-325 MG PO TABS: 1.0000 | ORAL_TABLET | ORAL | 0 refills | Status: DC | PRN

## 2017-02-12 MED ORDER — HYDROCODONE-ACETAMINOPHEN 5-325 MG PO TABS
1.0000 | ORAL_TABLET | Freq: Once | ORAL | Status: AC
  Administered 2017-02-12: 1 via ORAL
  Filled 2017-02-12: qty 1

## 2017-02-12 NOTE — ED Notes (Signed)
Pharmacy at bedside

## 2017-02-12 NOTE — ED Notes (Signed)
xerofoam dressing applied and wrapped

## 2017-02-12 NOTE — ED Triage Notes (Signed)
Pt reports treated with abx for cellulitis on lower right leg, states she finished abx on Tuesday but redness, drainage and pain are still present.

## 2017-02-12 NOTE — ED Notes (Signed)
Papers reviewed with patient and she verbalizes understanding. SHe had a question about a possible reaction to a medication and requested that I review medications from prior visits. I spent a good 15 minutes looking through old notes to ensure that the medication is safe and has been reviewed against her allergies. Follow up care reviewed

## 2017-02-12 NOTE — ED Provider Notes (Signed)
[facility]EMER[facility]ENCY DEPT Provider Note   CSN: [id number] Arrival date & time: 02/12/17  1147     History   Chief Complaint Chief Complaint  Patient presents with  . Cellulitis    HPI Deborah Ho is a 66 y.o. female.  HPI A little over 2 weeks ago the patient started leaking clear fluid from her left leg. She reports that when it started it was just leaking, after a couple days it got pretty red. That time she went to the emergency department and was started on Keflex. She reports that she has completed her antibiotics several days ago but it continues to get worse and now she has a wound on the back of her leg and the redness seems to be going up the leg some more. It is painful at times but not necessarily associated with weightbearing. No fever no chills no associated constitutional symptoms. Past Medical History:  Diagnosis Date  . CAD (coronary artery disease) 05/03/14; 05/09/14   a. anterior STEMI with early in-stent thorombosis for missed dose of Brillinta s/p PCI with DESx 2 into LAD (04/2014)  . CHF (congestive heart failure) (HCC)   . GERD (gastroesophageal reflux disease)    Probable  . HTN (hypertension)   . Hyperlipidemia   . Ischemic cardiomyopathy    a. 04/2014 ECHO with EF 45-50% b.  Repeat 2D echo 08/14/14 with EF down at 15%. Life vest placed  . Non compliance w medication regimen   . Tobacco use     Patient Active Problem List   Diagnosis Date Noted  . Cellulitis of left lower leg   . Acute on chronic systolic and diastolic heart failure, NYHA class 1 (HCC) 01/28/2017  . Hypokalemia 01/28/2017  . Peripheral edema   . Abdominal distension 06/26/2016  . Anasarca 06/26/2016  . CKD (chronic kidney disease), stage III   . Adjustment disorder with mixed anxiety and depressed mood 02/28/2015  . Pleural effusion on right   . Essential hypertension   . Acute on chronic systolic heart failure (HCC) 08/22/2014  . Angioedema of lips 08/17/2014  . Ischemic  cardiomyopathy 08/13/2014  . Coronary atherosclerosis of native coronary artery 05/10/2014  . STEMI (ST elevation myocardial infarction) (HCC) 05/09/2014  . HLD (hyperlipidemia) 05/06/2014  . Tobacco abuse 05/06/2014  . Cardiomyopathy, ischemic, EF 30-35 % Echo 05/04/14. 05/06/2014  . Elevated LFTs 05/06/2014  . ST elevation myocardial infarction (STEMI) of anterior wall (HCC) 05/03/2014    Past Surgical History:  Procedure Laterality Date  . CHOLECYSTECTOMY    . CORONARY ANGIOPLASTY WITH STENT PLACEMENT  05/03/14   STEMI- stent to LAD DES- Xience alpine  . CORONARY ANGIOPLASTY WITH STENT PLACEMENT  05/09/14   STEMI- overlapping stent to LAD, pt had missed a dose of Brilinta  . LEFT HEART CATHETERIZATION WITH CORONARY ANGIOGRAM N/A 05/03/2014   Procedure: LEFT HEART CATHETERIZATION WITH CORONARY ANGIOGRAM;  Surgeon: Peter M Swaziland, MD;  Location: [facility] CATH LAB;  Service: Cardiovascular;  Laterality: N/A;  . LEFT HEART CATHETERIZATION WITH CORONARY ANGIOGRAM N/A 05/09/2014   Procedure: LEFT HEART CATHETERIZATION WITH CORONARY ANGIOGRAM;  Surgeon: Corky Crafts, MD;  Location: [facility] CATH LAB;  Service: Cardiovascular;  Laterality: N/A;  . PARTIAL HYSTERECTOMY    . PERCUTANEOUS STENT INTERVENTION  05/03/2014   Procedure: PERCUTANEOUS STENT INTERVENTION;  Surgeon: Peter M Swaziland, MD;  Location: [facility] CATH LAB;  Service: Cardiovascular;;  DES Prox LAD     OB History    No data available  Home Medications    Prior to Admission medications   Medication Sig Start Date End Date Taking? Authorizing Provider  atorvastatin (LIPITOR) 80 MG tablet Take 1 tablet (80 mg total) by mouth daily at 6 PM. 07/06/16  Yes Clanford L Laural Benes, MD  cetirizine (ZYRTEC) 10 MG tablet Take 10 mg by mouth daily as needed for allergies.    Yes Historical Provider, MD  ferrous sulfate 325 (65 FE) MG tablet Take 325 mg by mouth 2 (two) times a week. Tuesdays and Thursdays   Yes Historical Provider, MD    nitroGLYCERIN (NITROSTAT) 0.4 MG SL tablet Place 0.4 mg under the tongue every 5 (five) minutes as needed for chest pain.   Yes Historical Provider, MD  sacubitril-valsartan (ENTRESTO) 24-26 MG Take 1 tablet by mouth 2 (two) times daily. 02/06/17  Yes Zannie Cove, MD  ticagrelor (BRILINTA) 60 MG TABS tablet Take 1 tablet (60 mg total) by mouth 2 (two) times daily. 07/22/16  Yes Laurey Morale, MD  torsemide (DEMADEX) 20 MG tablet Take 3 tablets (60 mg total) by mouth daily. Patient taking differently: Take 20 mg by mouth 3 (three) times daily.  02/06/17  Yes Zannie Cove, MD  carvedilol (COREG) 6.25 MG tablet Take 1 tablet (6.25 mg total) by mouth 2 (two) times daily with a meal. 02/06/17   Zannie Cove, MD  doxycycline (VIBRAMYCIN) 100 MG capsule Take 1 capsule (100 mg total) by mouth 2 (two) times daily. One po bid x 7 days 02/12/17   Arby Barrette, MD  HYDROcodone-acetaminophen (NORCO/VICODIN) 5-325 MG tablet Take 1-2 tablets by mouth every 4 (four) hours as needed for moderate pain or severe pain. 02/12/17   Arby Barrette, MD  spironolactone (ALDACTONE) 25 MG tablet Take 1 tablet (25 mg total) by mouth daily. 02/06/17   Zannie Cove, MD    Family History Family History  Problem Relation Age of Onset  . Diabetes Mother   . Hypertension Mother     Social History Social History  Substance Use Topics  . Smoking status: Current Some Day Smoker    Packs/day: 0.10    Years: 0.50    Types: Cigarettes  . Smokeless tobacco: Current User     Comment: down to 3 cigarettes daily (08/03/14)  . Alcohol use No     Allergies   Aspirin; Effient [prasugrel]; Entresto [sacubitril-valsartan]; Lactose intolerance (gi); Robitussin dm [guaifenesin-dm]; Sulfa antibiotics; and Wheat bran   Review of Systems Review of Systems  10 Systems reviewed and are negative for acute change except as noted in the HPI.  Physical Exam Updated Vital Signs BP (!) 154/104 (BP Location: Right Arm)   Pulse  (!) 106   Temp 97.5 F (36.4 C) (Oral)   Resp 20   SpO2 97%   Physical Exam  Constitutional: She is oriented to person, place, and time. She appears well-developed and well-nourished. No distress.  HENT:  Head: Normocephalic and atraumatic.  Eyes: Conjunctivae are normal.  Neck: Neck supple.  Cardiovascular: Normal rate and regular rhythm.   No murmur heard. Pulmonary/Chest: Effort normal and breath sounds normal. No respiratory distress.  Abdominal: Soft. There is no tenderness.  Musculoskeletal: She exhibits edema.  Left lower extremity has a proximally 2 cm ulcer on the posterior lower leg with yellow crusting eschar. There is 1+ edema. Skin is thin and hyperpigmented. She does have erythema surrounding the ulceration and wrapping anteriorly on the lower portion of the leg. Right lower extremity has skin thinning and hyperpigmentation consistent with venous  stasis but no active ulcers or wounds.  Neurological: She is alert and oriented to person, place, and time. No cranial nerve deficit. She exhibits normal muscle tone. Coordination normal.  Skin: Skin is warm and dry.  Psychiatric: She has a normal mood and affect.  Nursing note and vitals reviewed.    ED Treatments / Results  Labs (all labs ordered are listed, but only abnormal results are displayed) Labs Reviewed  COMPREHENSIVE METABOLIC PANEL - Abnormal; Notable for the following:       Result Value   Chloride 112 (*)    Glucose, Bld 100 (*)    Creatinine, Ser 1.22 (*)    Albumin 3.4 (*)    AST 50 (*)    Alkaline Phosphatase 189 (*)    GFR calc non Af Amer 45 (*)    GFR calc Af Amer 53 (*)    All other components within normal limits  CBC WITH DIFFERENTIAL/PLATELET - Abnormal; Notable for the following:    RBC 5.35 (*)    HCT 47.3 (*)    RDW 15.6 (*)    Platelets 467 (*)    All other components within normal limits  I-STAT CG4 LACTIC ACID, ED  I-STAT CG4 LACTIC ACID, ED    EKG  EKG Interpretation None        Radiology No results found.  Procedures Procedures (including critical care time)  Medications Ordered in ED Medications  doxycycline (VIBRA-TABS) tablet 100 mg (100 mg Oral Given 02/12/17 1531)  HYDROcodone-acetaminophen (NORCO/VICODIN) 5-325 MG per tablet 1 tablet (1 tablet Oral Given 02/12/17 1531)     Initial Impression / Assessment and Plan / ED Course  I have reviewed the triage vital signs and the nursing notes.  Pertinent labs & imaging results that were available during my care of the patient were reviewed by me and considered in my medical decision making (see chart for details).      Final Clinical Impressions(s) / ED Diagnoses   Final diagnoses:  Venous stasis ulcer of left calf limited to breakdown of skin without varicose veins (HCC)  findings are consistent with venous stasis ulcer. Patient has had a course of Keflex and not had improvement. She is counseled that she does need wound care at the wound clinic. She will be started on a course of doxycycline as her does continue to be redness of the lower leg. At this time however I do not feel that she needs to be admitted to the hospital. She is still alert and appropriate without any other constitutional symptoms. Patient voices understanding of the follow-up plan with wound care.  New Prescriptions New Prescriptions   DOXYCYCLINE (VIBRAMYCIN) 100 MG CAPSULE    Take 1 capsule (100 mg total) by mouth 2 (two) times daily. One po bid x 7 days   HYDROCODONE-ACETAMINOPHEN (NORCO/VICODIN) 5-325 MG TABLET    Take 1-2 tablets by mouth every 4 (four) hours as needed for moderate pain or severe pain.     Arby Barrette, MD 02/12/17 [phone]

## 2017-02-18 ENCOUNTER — Inpatient Hospital Stay (HOSPITAL_COMMUNITY)
Admission: EM | Admit: 2017-02-18 | Discharge: 2017-02-21 | DRG: 392 | Disposition: A | Discharge status: Home Health | Payer: Medicare Other | Attending: Internal Medicine | Admitting: Internal Medicine

## 2017-02-18 ENCOUNTER — Other Ambulatory Visit: Payer: Self-pay

## 2017-02-18 ENCOUNTER — Encounter (HOSPITAL_COMMUNITY): Payer: Self-pay | Admitting: Emergency Medicine

## 2017-02-18 ENCOUNTER — Observation Stay (HOSPITAL_COMMUNITY): Payer: Medicare Other

## 2017-02-18 ENCOUNTER — Ambulatory Visit (HOSPITAL_BASED_OUTPATIENT_CLINIC_OR_DEPARTMENT_OTHER)
Admission: RE | Admit: 2017-02-18 | Discharge: 2017-02-18 | Disposition: A | Discharge status: Home / Self Care | Payer: Medicare Other | Source: Ambulatory Visit | Attending: Internal Medicine | Admitting: Internal Medicine

## 2017-02-18 ENCOUNTER — Encounter (HOSPITAL_COMMUNITY): Payer: Self-pay

## 2017-02-18 VITALS — BP 178/122 | HR 98 | Wt 203.0 lb

## 2017-02-18 DIAGNOSIS — L03116 Cellulitis of left lower limb: Secondary | ICD-10-CM | POA: Diagnosis present

## 2017-02-18 DIAGNOSIS — Z79899 Other long term (current) drug therapy: Secondary | ICD-10-CM | POA: Insufficient documentation

## 2017-02-18 DIAGNOSIS — I2510 Atherosclerotic heart disease of native coronary artery without angina pectoris: Secondary | ICD-10-CM | POA: Insufficient documentation

## 2017-02-18 DIAGNOSIS — R05 Cough: Secondary | ICD-10-CM

## 2017-02-18 DIAGNOSIS — N183 Chronic kidney disease, stage 3 unspecified: Secondary | ICD-10-CM

## 2017-02-18 DIAGNOSIS — R058 Other specified cough: Secondary | ICD-10-CM

## 2017-02-18 DIAGNOSIS — I5022 Chronic systolic (congestive) heart failure: Secondary | ICD-10-CM | POA: Insufficient documentation

## 2017-02-18 DIAGNOSIS — K219 Gastro-esophageal reflux disease without esophagitis: Secondary | ICD-10-CM | POA: Diagnosis present

## 2017-02-18 DIAGNOSIS — F17210 Nicotine dependence, cigarettes, uncomplicated: Secondary | ICD-10-CM | POA: Insufficient documentation

## 2017-02-18 DIAGNOSIS — F4323 Adjustment disorder with mixed anxiety and depressed mood: Secondary | ICD-10-CM | POA: Diagnosis not present

## 2017-02-18 DIAGNOSIS — I10 Essential (primary) hypertension: Secondary | ICD-10-CM | POA: Diagnosis not present

## 2017-02-18 DIAGNOSIS — Z9119 Patient's noncompliance with other medical treatment and regimen: Secondary | ICD-10-CM

## 2017-02-18 DIAGNOSIS — L97929 Non-pressure chronic ulcer of unspecified part of left lower leg with unspecified severity: Secondary | ICD-10-CM

## 2017-02-18 DIAGNOSIS — Z955 Presence of coronary angioplasty implant and graft: Secondary | ICD-10-CM

## 2017-02-18 DIAGNOSIS — I5042 Chronic combined systolic (congestive) and diastolic (congestive) heart failure: Secondary | ICD-10-CM | POA: Diagnosis present

## 2017-02-18 DIAGNOSIS — I252 Old myocardial infarction: Secondary | ICD-10-CM

## 2017-02-18 DIAGNOSIS — R112 Nausea with vomiting, unspecified: Secondary | ICD-10-CM | POA: Diagnosis not present

## 2017-02-18 DIAGNOSIS — I255 Ischemic cardiomyopathy: Secondary | ICD-10-CM | POA: Diagnosis not present

## 2017-02-18 DIAGNOSIS — I130 Hypertensive heart and chronic kidney disease with heart failure and stage 1 through stage 4 chronic kidney disease, or unspecified chronic kidney disease: Secondary | ICD-10-CM | POA: Diagnosis present

## 2017-02-18 DIAGNOSIS — I509 Heart failure, unspecified: Secondary | ICD-10-CM | POA: Insufficient documentation

## 2017-02-18 DIAGNOSIS — Z8249 Family history of ischemic heart disease and other diseases of the circulatory system: Secondary | ICD-10-CM

## 2017-02-18 DIAGNOSIS — E785 Hyperlipidemia, unspecified: Secondary | ICD-10-CM | POA: Insufficient documentation

## 2017-02-18 DIAGNOSIS — K209 Esophagitis, unspecified: Secondary | ICD-10-CM | POA: Diagnosis not present

## 2017-02-18 DIAGNOSIS — R7309 Other abnormal glucose: Secondary | ICD-10-CM | POA: Diagnosis present

## 2017-02-18 DIAGNOSIS — Z833 Family history of diabetes mellitus: Secondary | ICD-10-CM

## 2017-02-18 DIAGNOSIS — T364X5A Adverse effect of tetracyclines, initial encounter: Secondary | ICD-10-CM | POA: Diagnosis present

## 2017-02-18 LAB — CBC WITH DIFFERENTIAL/PLATELET
Basophils Absolute: 0.1 10*3/uL (ref 0.0–0.1)
Basophils Relative: 1 %
Eosinophils Absolute: 0.1 10*3/uL (ref 0.0–0.7)
Eosinophils Relative: 1 %
HCT: 44.4 % (ref 36.0–46.0)
Hemoglobin: 14.1 g/dL (ref 12.0–15.0)
Lymphocytes Relative: 21 %
Lymphs Abs: 2.2 10*3/uL (ref 0.7–4.0)
MCH: 27.5 pg (ref 26.0–34.0)
MCHC: 31.8 g/dL (ref 30.0–36.0)
MCV: 86.5 fL (ref 78.0–100.0)
Monocytes Absolute: 0.7 10*3/uL (ref 0.1–1.0)
Monocytes Relative: 6 %
Neutro Abs: 7.6 10*3/uL (ref 1.7–7.7)
Neutrophils Relative %: 71 %
Platelets: 363 10*3/uL (ref 150–400)
RBC: 5.13 MIL/uL — ABNORMAL HIGH (ref 3.87–5.11)
RDW: 15.3 % (ref 11.5–15.5)
WBC: 10.6 10*3/uL — ABNORMAL HIGH (ref 4.0–10.5)

## 2017-02-18 LAB — I-STAT CG4 LACTIC ACID, ED
Lactic Acid, Venous: 2.22 mmol/L (ref 0.5–1.9)
Lactic Acid, Venous: 2.2 mmol/L (ref 0.5–1.9)

## 2017-02-18 LAB — COMPREHENSIVE METABOLIC PANEL
ALT: 21 U/L (ref 14–54)
AST: 28 U/L (ref 15–41)
Albumin: 3.4 g/dL — ABNORMAL LOW (ref 3.5–5.0)
Alkaline Phosphatase: 194 U/L — ABNORMAL HIGH (ref 38–126)
Anion gap: 12 (ref 5–15)
BUN: 22 mg/dL — ABNORMAL HIGH (ref 6–20)
CO2: 18 mmol/L — ABNORMAL LOW (ref 22–32)
Calcium: 9.6 mg/dL (ref 8.9–10.3)
Chloride: 109 mmol/L (ref 101–111)
Creatinine, Ser: 1.34 mg/dL — ABNORMAL HIGH (ref 0.44–1.00)
GFR calc Af Amer: 47 mL/min — ABNORMAL LOW (ref >60)
GFR calc non Af Amer: 41 mL/min — ABNORMAL LOW (ref >60)
Glucose, Bld: 98 mg/dL (ref 65–99)
Potassium: 4.4 mmol/L (ref 3.5–5.1)
Sodium: 139 mmol/L (ref 135–145)
Total Bilirubin: 1.5 mg/dL — ABNORMAL HIGH (ref 0.3–1.2)
Total Protein: 7 g/dL (ref 6.5–8.1)

## 2017-02-18 MED ORDER — ENOXAPARIN SODIUM 40 MG/0.4ML ~~LOC~~ SOLN
40.0000 mg | SUBCUTANEOUS | Status: DC
  Administered 2017-02-18 – 2017-02-20 (×3): 40 mg via SUBCUTANEOUS
  Filled 2017-02-18 (×3): qty 0.4

## 2017-02-18 MED ORDER — SODIUM CHLORIDE 0.9 % IV SOLN
INTRAVENOUS | Status: AC
  Administered 2017-02-18 (×2): via INTRAVENOUS

## 2017-02-18 MED ORDER — FENTANYL CITRATE (PF) 100 MCG/2ML IJ SOLN
50.0000 ug | Freq: Once | INTRAMUSCULAR | Status: AC
  Administered 2017-02-18: 50 ug via INTRAVENOUS
  Filled 2017-02-18: qty 2

## 2017-02-18 MED ORDER — CARVEDILOL 6.25 MG PO TABS
6.2500 mg | ORAL_TABLET | Freq: Two times a day (BID) | ORAL | Status: DC
  Administered 2017-02-19 – 2017-02-21 (×5): 6.25 mg via ORAL
  Filled 2017-02-18 (×5): qty 1

## 2017-02-18 MED ORDER — TICAGRELOR 60 MG PO TABS
60.0000 mg | ORAL_TABLET | Freq: Two times a day (BID) | ORAL | Status: DC
  Administered 2017-02-18 – 2017-02-21 (×6): 60 mg via ORAL
  Filled 2017-02-18 (×7): qty 1

## 2017-02-18 MED ORDER — FAMOTIDINE IN NACL 20-0.9 MG/50ML-% IV SOLN
20.0000 mg | Freq: Two times a day (BID) | INTRAVENOUS | Status: DC
  Administered 2017-02-19: 20 mg via INTRAVENOUS
  Filled 2017-02-18 (×2): qty 50

## 2017-02-18 MED ORDER — MORPHINE SULFATE (PF) 4 MG/ML IV SOLN
1.0000 mg | INTRAVENOUS | Status: DC | PRN
  Administered 2017-02-18 – 2017-02-20 (×4): 1 mg via INTRAVENOUS
  Filled 2017-02-18 (×5): qty 1

## 2017-02-18 MED ORDER — CEFAZOLIN IN D5W 1 GM/50ML IV SOLN
1.0000 g | Freq: Three times a day (TID) | INTRAVENOUS | Status: DC
  Administered 2017-02-18 – 2017-02-19 (×4): 1 g via INTRAVENOUS
  Filled 2017-02-18 (×7): qty 50

## 2017-02-18 MED ORDER — SODIUM CHLORIDE 0.9% FLUSH
3.0000 mL | Freq: Two times a day (BID) | INTRAVENOUS | Status: DC
  Administered 2017-02-18 – 2017-02-21 (×5): 3 mL via INTRAVENOUS

## 2017-02-18 MED ORDER — ONDANSETRON HCL 4 MG/2ML IJ SOLN: 4.0000 mg | Freq: Four times a day (QID) | INTRAMUSCULAR | Status: DC | PRN

## 2017-02-18 MED ORDER — ONDANSETRON HCL 4 MG/2ML IJ SOLN
4.0000 mg | Freq: Once | INTRAMUSCULAR | Status: AC
  Administered 2017-02-18: 4 mg via INTRAVENOUS
  Filled 2017-02-18: qty 2

## 2017-02-18 MED ORDER — FUROSEMIDE 10 MG/ML IJ SOLN
20.0000 mg | Freq: Three times a day (TID) | INTRAMUSCULAR | Status: DC
  Administered 2017-02-19: 20 mg via INTRAVENOUS
  Filled 2017-02-18: qty 2

## 2017-02-18 MED ORDER — ONDANSETRON HCL 4 MG PO TABS: 4.0000 mg | ORAL_TABLET | Freq: Four times a day (QID) | ORAL | Status: DC | PRN

## 2017-02-18 MED ORDER — HYDRALAZINE HCL 10 MG PO TABS: 10.0000 mg | ORAL_TABLET | Freq: Three times a day (TID) | ORAL | Status: DC | PRN

## 2017-02-18 MED ORDER — LOSARTAN POTASSIUM 25 MG PO TABS: 25.0000 mg | ORAL_TABLET | Freq: Every day | ORAL | 6 refills | Status: DC

## 2017-02-18 MED ORDER — DOXYCYCLINE HYCLATE 100 MG IV SOLR
100.0000 mg | Freq: Once | INTRAVENOUS | Status: DC
  Filled 2017-02-18 (×2): qty 100

## 2017-02-18 MED ORDER — NITROGLYCERIN 0.4 MG SL SUBL: 0.4000 mg | SUBLINGUAL_TABLET | SUBLINGUAL | Status: DC | PRN

## 2017-02-18 NOTE — ED Provider Notes (Signed)
[facility] DEPT Provider Note   CSN: [id number] Arrival date & time: 02/18/17  1549     History   Chief Complaint Chief Complaint  Patient presents with  . Wound Check    HPI Deborah Ho is a 66 y.o. female.  The history is provided by the patient.  Wound Check  This is a recurrent problem. Episode onset: 3 weeks. The problem occurs constantly. Progression since onset: fluctuating. Pertinent negatives include no chest pain, no abdominal pain, no headaches and no shortness of breath. Nothing aggravates the symptoms. Nothing relieves the symptoms. Treatments tried: keflex and doxy. The treatment provided mild relief.   Reports having nausea and NBNB emesis associated with taking Doxycycline.  Seen by Vascular surgery today for follow up who recommended she present for admission and IV Abx.   Past Medical History:  Diagnosis Date  . CAD (coronary artery disease) 05/03/14; 05/09/14   a. anterior STEMI with early in-stent thorombosis for missed dose of Brillinta s/p PCI with DESx 2 into LAD (04/2014)  . CHF (congestive heart failure) (HCC)   . GERD (gastroesophageal reflux disease)    Probable  . HTN (hypertension)   . Hyperlipidemia   . Ischemic cardiomyopathy    a. 04/2014 ECHO with EF 45-50% b.  Repeat 2D echo 08/14/14 with EF down at 15%. Life vest placed  . Non compliance w medication regimen   . Tobacco use     Patient Active Problem List   Diagnosis Date Noted  . Cellulitis of left lower leg   . Hypokalemia 01/28/2017  . Peripheral edema   . Abdominal distension 06/26/2016  . Anasarca 06/26/2016  . Chronic systolic heart failure (HCC) 02/20/2016  . CKD (chronic kidney disease), stage III   . Adjustment disorder with mixed anxiety and depressed mood 02/28/2015  . Pleural effusion on right   . Essential hypertension   . Angioedema of lips 08/17/2014  . Ischemic cardiomyopathy 08/13/2014  . Coronary atherosclerosis of native coronary artery 05/10/2014  . STEMI  (ST elevation myocardial infarction) (HCC) 05/09/2014  . HLD (hyperlipidemia) 05/06/2014  . Tobacco abuse 05/06/2014  . Cardiomyopathy, ischemic, EF 30-35 % Echo 05/04/14. 05/06/2014  . Elevated LFTs 05/06/2014  . ST elevation myocardial infarction (STEMI) of anterior wall (HCC) 05/03/2014    Past Surgical History:  Procedure Laterality Date  . CHOLECYSTECTOMY    . CORONARY ANGIOPLASTY WITH STENT PLACEMENT  05/03/14   STEMI- stent to LAD DES- Xience alpine  . CORONARY ANGIOPLASTY WITH STENT PLACEMENT  05/09/14   STEMI- overlapping stent to LAD, pt had missed a dose of Brilinta  . LEFT HEART CATHETERIZATION WITH CORONARY ANGIOGRAM N/A 05/03/2014   Procedure: LEFT HEART CATHETERIZATION WITH CORONARY ANGIOGRAM;  Surgeon: Peter M Swaziland, MD;  Location: [facility] CATH LAB;  Service: Cardiovascular;  Laterality: N/A;  . LEFT HEART CATHETERIZATION WITH CORONARY ANGIOGRAM N/A 05/09/2014   Procedure: LEFT HEART CATHETERIZATION WITH CORONARY ANGIOGRAM;  Surgeon: Corky Crafts, MD;  Location: [facility] CATH LAB;  Service: Cardiovascular;  Laterality: N/A;  . PARTIAL HYSTERECTOMY    . PERCUTANEOUS STENT INTERVENTION  05/03/2014   Procedure: PERCUTANEOUS STENT INTERVENTION;  Surgeon: Peter M Swaziland, MD;  Location: [facility] CATH LAB;  Service: Cardiovascular;;  DES Prox LAD     OB History    No data available       Home Medications    Prior to Admission medications   Medication Sig Start Date End Date Taking? Authorizing Provider  atorvastatin (LIPITOR) 80 MG tablet Take 1 tablet (  80 mg total) by mouth daily at 6 PM. 07/06/16   Clanford Cyndie Mull, MD  carvedilol (COREG) 6.25 MG tablet Take 1 tablet (6.25 mg total) by mouth 2 (two) times daily with a meal. 02/06/17   Zannie Cove, MD  cetirizine (ZYRTEC) 10 MG tablet Take 10 mg by mouth daily as needed for allergies.     Historical Provider, MD  doxycycline (VIBRAMYCIN) 100 MG capsule Take 1 capsule (100 mg total) by mouth 2 (two) times daily. One po bid x 7 days  02/12/17   Arby Barrette, MD  ferrous sulfate 325 (65 FE) MG tablet Take 325 mg by mouth 2 (two) times a week. Tuesdays and Thursdays    Historical Provider, MD  HYDROcodone-acetaminophen (NORCO/VICODIN) 5-325 MG tablet Take 1-2 tablets by mouth every 4 (four) hours as needed for moderate pain or severe pain. 02/12/17   Arby Barrette, MD  losartan (COZAAR) 25 MG tablet Take 1 tablet (25 mg total) by mouth at bedtime. 02/18/17 05/19/17  Graciella Freer, PA-C  nitroGLYCERIN (NITROSTAT) 0.4 MG SL tablet Place 0.4 mg under the tongue every 5 (five) minutes as needed for chest pain.    Historical Provider, MD  spironolactone (ALDACTONE) 25 MG tablet Take 1 tablet (25 mg total) by mouth daily. 02/06/17   Zannie Cove, MD  ticagrelor (BRILINTA) 60 MG TABS tablet Take 1 tablet (60 mg total) by mouth 2 (two) times daily. 07/22/16   Laurey Morale, MD  torsemide (DEMADEX) 20 MG tablet Take 3 tablets (60 mg total) by mouth daily. Patient taking differently: Take 20 mg by mouth 3 (three) times daily.  02/06/17   Zannie Cove, MD    Family History Family History  Problem Relation Age of Onset  . Diabetes Mother   . Hypertension Mother     Social History Social History  Substance Use Topics  . Smoking status: Current Some Day Smoker    Packs/day: 0.10    Years: 0.50    Types: Cigarettes  . Smokeless tobacco: Current User     Comment: down to 3 cigarettes daily (08/03/14)  . Alcohol use No     Allergies   Aspirin; Effient [prasugrel]; Entresto [sacubitril-valsartan]; Lactose intolerance (gi); Robitussin dm [guaifenesin-dm]; Sulfa antibiotics; and Wheat bran   Review of Systems Review of Systems  Respiratory: Negative for shortness of breath.   Cardiovascular: Negative for chest pain.  Gastrointestinal: Negative for abdominal pain.  Neurological: Negative for headaches.  Ten systems are reviewed and are negative for acute change except as noted in the HPI    Physical Exam Updated  Vital Signs BP (!) 151/104 (BP Location: Right Arm)   Pulse 94   Temp 97.8 F (36.6 C) (Oral)   Resp 16   SpO2 98%   Physical Exam  Constitutional: She is oriented to person, place, and time. She appears well-developed and well-nourished. No distress.  HENT:  Head: Normocephalic and atraumatic.  Nose: Nose normal.  Eyes: Conjunctivae and EOM are normal. Pupils are equal, round, and reactive to light. Right eye exhibits no discharge. Left eye exhibits no discharge. No scleral icterus.  Neck: Normal range of motion. Neck supple.  Cardiovascular: Normal rate and regular rhythm.  Exam reveals no gallop and no friction rub.   No murmur heard. Pulmonary/Chest: Effort normal and breath sounds normal. No stridor. No respiratory distress. She has no rales.  Abdominal: Soft. She exhibits no distension. There is no tenderness.  Musculoskeletal: She exhibits no edema or tenderness.  Neurological: She  is alert and oriented to person, place, and time.  Skin: Skin is warm and dry. No rash noted. She is not diaphoretic. No erythema.     Psychiatric: She has a normal mood and affect.  Vitals reviewed.    ED Treatments / Results  Labs (all labs ordered are listed, but only abnormal results are displayed) Labs Reviewed  COMPREHENSIVE METABOLIC PANEL - Abnormal; Notable for the following:       Result Value   CO2 18 (*)    BUN 22 (*)    Creatinine, Ser 1.34 (*)    Albumin 3.4 (*)    Alkaline Phosphatase 194 (*)    Total Bilirubin 1.5 (*)    GFR calc non Af Amer 41 (*)    GFR calc Af Amer 47 (*)    All other components within normal limits  CBC WITH DIFFERENTIAL/PLATELET - Abnormal; Notable for the following:    WBC 10.6 (*)    RBC 5.13 (*)    All other components within normal limits  I-STAT CG4 LACTIC ACID, ED - Abnormal; Notable for the following:    Lactic Acid, Venous 2.22 (*)    All other components within normal limits  CULTURE, BLOOD (ROUTINE X 2)  CULTURE, BLOOD (ROUTINE X 2)      EKG  EKG Interpretation None       Radiology No results found.  Procedures Procedures (including critical care time)  Medications Ordered in ED Medications  doxycycline (VIBRAMYCIN) 100 mg in dextrose 5 % 250 mL IVPB (not administered)  ondansetron (ZOFRAN) injection 4 mg (not administered)     Initial Impression / Assessment and Plan / ED Course  I have reviewed the triage vital signs and the nursing notes.  Pertinent labs & imaging results that were available during my care of the patient were reviewed by me and considered in my medical decision making (see chart for details).     Patient here with left leg cellulitis currently being treated with doxycycline with associated nausea vomiting likely due to medication side effects. However patient has been unable to tolerate by mouth and has had 5-6 episodes of emesis for the past 3-4 days. Patient reports that the erythema has improved however there area that is tender to palpation has expanded, concerning for possible failed outpatient management. On exam no subcutaneous crepitus concerning for necrotizing fasciitis and no underlying fluctuance concerning for abscess. We'll admit to hospitalist service for IV antibiotics.  Final Clinical Impressions(s) / ED Diagnoses   Final diagnoses:  Cellulitis of left lower extremity     Nira Conn, MD 02/18/17 [phone]

## 2017-02-18 NOTE — Patient Instructions (Addendum)
STOP Entresto.  START Losartan 25 mg tablet once daily at bedtime.  Will refer you to [facility] for home health skilled nursing and wound care. They will contact you to schedule your first home appointment.  Follow up 6 weeks with Dr. Shirlee Latch.  Do the following things EVERYDAY: 1) Weigh yourself in the morning before breakfast. Write it down and keep it in a log. 2) Take your medicines as prescribed 3) Eat low salt foods-Limit salt (sodium) to 2000 mg per day.  4) Stay as active as you can everyday 5) Limit all fluids for the day to less than 2 liters

## 2017-02-18 NOTE — Progress Notes (Signed)
Patient ID: Deborah Ho, female   DOB: Oct 11, 1951, 66 y.o.   MRN: [id number]    Advanced Heart Failure Clinic Note   PCP: [name] Primary HF MD: Dr Shirlee Latch   HPI: Deborah Ho is a 66 y.o. female with a history of HTN, HLD, tobacco abuse, CAD s/panterior STEMI with early stent thrombosis for missing doses of Brilinta s/p PCI x 2 into LAD (04/2014) and chronic systolic HF with EF 20% by 3/16 echo.   Admitted to [facility] in 3/16 with CHF and R pleural effusion. Effusion tapped (transudative). Echo with EF down to 20%. Diuresed with IV lasix and transitioned torsemide + metolazone as needed for weight >187 pounds. Creatinine was 1.7 on the day discharge. Discharge weight 183 pounds.   She was readmitted in 4/16 with CHF in the setting of dietary and ?medical noncompliance.  She was diuresed with IV Lasix and discharged.    Admitted 02/20/16 - 02/25/16 with volume overload. She had not been compliant with her torsemide.  She diuresed 11.5 L and 29 lbs. Discharge 195 lbs.  Admitted 06/26/2016 through 07/06/2016 with marked volume overload. Diuresed with IV lasix and transitioned to torsemide 60 mg daily. Discharge weight 190 pounds. Referred to Paramedicine.   Admitted 3/15 -> 02/06/17 with A/C combined CHF. Diuresed 16.4L and down 30 lbs. Also treated with IV vanc and d/c on Keflex for LLE ulcer/cellulitis.   She presents today for post hospital follow up. Has been back in ED for continued cellulitis. Now on doxycline. Left leg poorly healing and continues to drain. Denies fever no chills. Has been nauseated with vomiting at home. She thinks its related to the ABX. Has been having some lightheadedness with rapid standing. Has wound clinic follow up next week.  Watching salt and fluid intake.  Denies DOE/PND/or orthopnea. Some diarrhea over the weekend. Did not take any medications this am due to vomiting. States she took them as prescribed yesterday, except has not been taking Entresto.    Review of systems complete and found to be negative unless listed in HPI.    9/16: Cardiac MRI with EF 19%, LAD territory scar (nonviable), no thrombus.  07/25/2015: ECHO EF 25% RV normal 01/30/2015 ECHO EF 20%  11/13/2014: ECHO EF 35-40% RV normal.  02/21/16 ECHO EF 15%. Mod reduced RV function, severe TR  Labs 02/05/2015: K 4.7 Creatinine 1.72  Labs 01/28/2015: K 3.4 Creatinine 1.16  Labs 4/16: K 3.3, creatinine 1.72 Labs 6/16: K 2.9, creatinine 0.97, BNP 1536, HCT 42.2 Labs 7/16: K 3.4, creatinine 1.18 Labs 9/16: creatinine 1.5 Labs 07/16/16: K 4.6, Creatinine 1.32  SH:  Social History   Social History  . Marital status: Widowed    Spouse name: N/A  . Number of children: N/A  . Years of education: N/A   Occupational History  . not employed    Social History Main Topics  . Smoking status: Current Some Day Smoker    Packs/day: 0.10    Years: 0.50    Types: Cigarettes  . Smokeless tobacco: Current User     Comment: down to 3 cigarettes daily (08/03/14)  . Alcohol use No  . Drug use: No  . Sexual activity: Not on file   Other Topics Concern  . Not on file   Social History Narrative   Patient has 6 brothers and sisters and none have known coronary artery disease. She lives alone in [place], but has several brothers in the area.    FH:  Family History  Problem Relation Age of Onset  . Diabetes Mother   . Hypertension Mother     Past Medical History:  Diagnosis Date  . CAD (coronary artery disease) 05/03/14; 05/09/14   a. anterior STEMI with early in-stent thorombosis for missed dose of Brillinta s/p PCI with DESx 2 into LAD (04/2014)  . CHF (congestive heart failure) (HCC)   . GERD (gastroesophageal reflux disease)    Probable  . HTN (hypertension)   . Hyperlipidemia   . Ischemic cardiomyopathy    a. 04/2014 ECHO with EF 45-50% b.  Repeat 2D echo 08/14/14 with EF down at 15%. Life vest placed  . Non compliance w medication regimen   . Tobacco use     Current  Outpatient Prescriptions  Medication Sig Dispense Refill  . atorvastatin (LIPITOR) 80 MG tablet Take 1 tablet (80 mg total) by mouth daily at 6 PM. 30 tablet 0  . carvedilol (COREG) 6.25 MG tablet Take 1 tablet (6.25 mg total) by mouth 2 (two) times daily with a meal. 30 tablet 0  . cetirizine (ZYRTEC) 10 MG tablet Take 10 mg by mouth daily as needed for allergies.     Marland Kitchen doxycycline (VIBRAMYCIN) 100 MG capsule Take 1 capsule (100 mg total) by mouth 2 (two) times daily. One po bid x 7 days 14 capsule 0  . ferrous sulfate 325 (65 FE) MG tablet Take 325 mg by mouth 2 (two) times a week. Tuesdays and Thursdays    . HYDROcodone-acetaminophen (NORCO/VICODIN) 5-325 MG tablet Take 1-2 tablets by mouth every 4 (four) hours as needed for moderate pain or severe pain. 20 tablet 0  . nitroGLYCERIN (NITROSTAT) 0.4 MG SL tablet Place 0.4 mg under the tongue every 5 (five) minutes as needed for chest pain.    . sacubitril-valsartan (ENTRESTO) 24-26 MG Take 1 tablet by mouth 2 (two) times daily. 60 tablet 0  . spironolactone (ALDACTONE) 25 MG tablet Take 1 tablet (25 mg total) by mouth daily. 30 tablet 0  . ticagrelor (BRILINTA) 60 MG TABS tablet Take 1 tablet (60 mg total) by mouth 2 (two) times daily. 60 tablet 6  . torsemide (DEMADEX) 20 MG tablet Take 3 tablets (60 mg total) by mouth daily. (Patient taking differently: Take 20 mg by mouth 3 (three) times daily. ) 30 tablet 0   No current facility-administered medications for this encounter.     Vitals:   02/18/17 1433  BP: (!) 178/122  Pulse: 98  SpO2: 98%  Weight: 183 lb 3.2 oz (83.1 kg)   Wt Readings from Last 3 Encounters:  02/18/17 183 lb 3.2 oz (83.1 kg)  02/06/17 198 lb 1.6 oz (89.9 kg)  08/06/16 192 lb 6.4 oz (87.3 kg)    PHYSICAL EXAM: General:  Elderly and fatigued appearing AA female. NAD.  HEENT: Normal.  Neck: Supple. JVP 7-8 cm. Carotids 2+ bilat; no bruits. No thyromegaly or nodule noted.   Cor: PMI normal. RRR. Slightly tachy. No  M/G/R noted.  Lungs: Clear, normal effort.  Abdomen: Soft, NT, ND, no HSM. No bruits or masses. +BS  Extremities: No cyanosis, clubbing, or rash. RLE with trace ankle edema.  LLE with approx 1 cm by 3 cm area of ulceration with dry yellow eschar covering. Mild erythema surrounding. No fever or active drainage.  Neuro: alert & oriented x 3, cranial nerves grossly intact. Moves all 4 extremities w/o difficulty. Affect pleasant.  ASSESSMENT & PLAN: 1.  Chronic systolic CHF:  Ischemic cardiomyopathy, Echo 02/21/16 15%.   -  Echo 02/01/17 with LVEF 20-25%, grade 3 DD, Mild MR, Mild LAE, RV Mod dilated and reduced. Mild/Mod TR.  - NYHA Class II. Volume status stable on exam.  - Continue torsemide 60 mg daily and Spironolactone 25 mg daily.  - Continue coreg 6.25 mg BID.   -Continue hydralazine 12.5 mg TID and imdur at 30 mg daily. HAs resolved with tylenol. No change - Stop Entresto. Pt states it makes her dizzy and refuses to re-challenge. Agrees to Losartan 25 mg qhs.    - Reinforced fluid restriction to < 2 L daily, sodium restriction to less than 2000 mg daily, and the importance of daily weights.   2. CAD: s/p anterior MI,  PCI x2 LAD 04/2014.  History of stent thrombosis. - Continue Brilinta 80 mg, atorvastatin 80 mg daily, and Coreg as above.  - No CP or concerns for ACS.    3. HTN-  - Has not taken any medications today. Non-compliant with Entresto. States it makes her dizzy and refuses to re-challenge.  - Will stop entresto and start losartan 25 mg qhs.  - Other meds ass above.    4. CKD stage III - BMET stable late last week. No indication today.  5. Cellulitis with LLE ulceration.  - Pt has approx 1 cm by 3 cm area of ulceration with dry yellow eschar covering. She has mild edema surrounding it but it does not feel hot to the touch nor is there an appreciable area of induration. - Dressing changed in clinic today by Tonye Becket, NP-C.  - Our recommendation is that she continue Doxycycline  and follow up with her PCP ASAP, and keep her Wound clinic appt next week.  - ABIs normal on 02/05/17 with ulceration.   Will forward note concerning pt cellulitis and ulceration to pt PCP. Return to clinic in 6 weeks. Sooner with any HF symptoms. Priority currently is to get leg healed.   At this time, with no drainage and ulceration that seems to be healing on current course, there is no indication for admittance at this time.  Pt knows to seek care if she experiences fevers, chills, near-syncope, or worsening drainage from her LLE site.   Graciella Freer, PA-C 02/18/2017   Greater than 50% of the 25 minute visit was spent in counseling/coordination of care regarding disease state education, wound dressing, coordination of care with PCP and referral for [facility] for wound care.

## 2017-02-18 NOTE — ED Notes (Addendum)
Comment code should have been 300  Copy of lactic acid results given to Dr Eudelia Bunch

## 2017-02-18 NOTE — ED Notes (Signed)
Informed RN-Chelsea of lactic acid result 2.22 mmol/ L

## 2017-02-18 NOTE — Addendum Note (Signed)
Encounter addended by: Chyrl Civatte, RN on: 02/18/2017  4:56 PM<BR>    Actions taken: Order list changed, Diagnosis association updated

## 2017-02-18 NOTE — ED Triage Notes (Signed)
Pt arrives to ED due to poor healing of her lower leg cellulitis. Pt was just seen at vascular center for follow up pt is here because she feels she may need IV antibiotics because she has been vomiting after taking her oral medications. Has appt with wound care next week.

## 2017-02-18 NOTE — H&P (Signed)
History and Physical    Deborah Ho NAT:[id number] DOB: 12-Apr-1951 DOA: 02/18/2017  PCP: Grayce Sessions, NP  Patient coming from: Home  Chief Complaint: Nausea and vomiting  HPI: Deborah Ho is a 66 y.o. female with medical history significant of CAD, HTN, HLD, GERD, combined systolic and diastolic CHF who presents with nausea and vomiting since Tuesday.  She reports vomiting 7 times during this stretch along with nausea preceding the vomiting.  She notes possible blood in the emesis at least once, but mostly it was the food she had eaten.  She notes eating [employer] on Tuesday before the vomiting commenced, but she ate her normal order and it did not taste off.  She has had associated gagging and retching.  She has also noticed increased coughing after the emesis productive of some thick white mucus.  Other associated symptoms include increased feeling of coldness and needing to turn her heat up but no distinct fever.  The most concerning thing to her is that she is not able to keep down her Abx for her chronic LE wound on the left side.  She has been on doxycycline and has not been able to keep the medication down for 2 days.  Her wound is actually improving.  She saw her CHF physician today and it was noted to be well healing.  However, due to the nausea and vomiting, her vascular surgeon recommended she come in to the hospital for IV abx.  Further symptoms include toe swelling on Tuesday, but this has improved.  She has kept most of her medications down except today's doses of her HF medications.  She reports no chest pain, no abdominal pain, no diarrhea, no weight gain or swelling.  She was recently exposed to salmonella over Christmas holiday and developed a rash, but this has resolved.    ED Course: In the ED, she was noted to have a low bicarb, mildly elevated BUN, Cr at baseline of 1.34.  Lactic acid was mildly elevated at 2.22, then 2.20.  Her WBC was elevated at 10.6 with a  Normal  differential.    Review of Systems: As per HPI otherwise 10 point review of systems negative.   Past Medical History:  Diagnosis Date  . CAD (coronary artery disease) 05/03/14; 05/09/14   a. anterior STEMI with early in-stent thorombosis for missed dose of Brillinta s/p PCI with DESx 2 into LAD (04/2014)  . CHF (congestive heart failure) (HCC)   . GERD (gastroesophageal reflux disease)    Probable  . HTN (hypertension)   . Hyperlipidemia   . Ischemic cardiomyopathy    a. 04/2014 ECHO with EF 45-50% b.  Repeat 2D echo 08/14/14 with EF down at 15%. Life vest placed  . Non compliance w medication regimen   . Tobacco use     Past Surgical History:  Procedure Laterality Date  . CHOLECYSTECTOMY    . CORONARY ANGIOPLASTY WITH STENT PLACEMENT  05/03/14   STEMI- stent to LAD DES- Xience alpine  . CORONARY ANGIOPLASTY WITH STENT PLACEMENT  05/09/14   STEMI- overlapping stent to LAD, pt had missed a dose of Brilinta  . LEFT HEART CATHETERIZATION WITH CORONARY ANGIOGRAM N/A 05/03/2014   Procedure: LEFT HEART CATHETERIZATION WITH CORONARY ANGIOGRAM;  Surgeon: Peter M Swaziland, MD;  Location: [facility] CATH LAB;  Service: Cardiovascular;  Laterality: N/A;  . LEFT HEART CATHETERIZATION WITH CORONARY ANGIOGRAM N/A 05/09/2014   Procedure: LEFT HEART CATHETERIZATION WITH CORONARY ANGIOGRAM;  Surgeon: Corky Crafts, MD;  Location:  [facility] CATH LAB;  Service: Cardiovascular;  Laterality: N/A;  . PARTIAL HYSTERECTOMY    . PERCUTANEOUS STENT INTERVENTION  05/03/2014   Procedure: PERCUTANEOUS STENT INTERVENTION;  Surgeon: Peter M Swaziland, MD;  Location: [facility] CATH LAB;  Service: Cardiovascular;;  DES Prox LAD    She reports that she quit smoking in December and now only smokes "occasionally"  reports that she has been smoking Cigarettes.  She has a 0.05 pack-year smoking history. She uses smokeless tobacco. She reports that she does not drink alcohol or use drugs.  Allergies  Allergen Reactions  . Aspirin Swelling     Chewable children's aspirin makes patients tongue and face swell  . Effient [Prasugrel] Swelling    Patient's tongue and face swells  . Entresto [Sacubitril-Valsartan] Other (See Comments)    dizziness  . Lactose Intolerance (Gi) Other (See Comments)    REACTION: stomach upset  . Robitussin Dm [Guaifenesin-Dm] Swelling    Patient's tongue swells  . Sulfa Antibiotics Swelling  . Wheat Bran Other (See Comments)    REACTION: unknown   Reviewed with patient, she does not know her father's medical history. Family History  Problem Relation Age of Onset  . Diabetes Mother   . Hypertension Mother     Prior to Admission medications   Medication Sig Start Date End Date Taking? Authorizing Provider  atorvastatin (LIPITOR) 80 MG tablet Take 1 tablet (80 mg total) by mouth daily at 6 PM. 07/06/16  Yes Clanford L Laural Benes, MD  carvedilol (COREG) 6.25 MG tablet Take 1 tablet (6.25 mg total) by mouth 2 (two) times daily with a meal. 02/06/17  Yes Zannie Cove, MD  cetirizine (ZYRTEC) 10 MG tablet Take 10 mg by mouth daily as needed for allergies.    Yes Historical Provider, MD  doxycycline (VIBRAMYCIN) 100 MG capsule Take 1 capsule (100 mg total) by mouth 2 (two) times daily. One po bid x 7 days 02/12/17  Yes Arby Barrette, MD  ferrous sulfate 325 (65 FE) MG tablet Take 325 mg by mouth 2 (two) times a week. Tuesdays and Thursdays   Yes Historical Provider, MD  HYDROcodone-acetaminophen (NORCO/VICODIN) 5-325 MG tablet Take 1-2 tablets by mouth every 4 (four) hours as needed for moderate pain or severe pain. 02/12/17  Yes Arby Barrette, MD  nitroGLYCERIN (NITROSTAT) 0.4 MG SL tablet Place 0.4 mg under the tongue every 5 (five) minutes as needed for chest pain.   Yes Historical Provider, MD  ticagrelor (BRILINTA) 60 MG TABS tablet Take 1 tablet (60 mg total) by mouth 2 (two) times daily. 07/22/16  Yes Laurey Morale, MD  torsemide (DEMADEX) 20 MG tablet Take 3 tablets (60 mg total) by mouth daily. Patient  taking differently: Take 20 mg by mouth 3 (three) times daily.  02/06/17  Yes Zannie Cove, MD  losartan (COZAAR) 25 MG tablet Take 1 tablet (25 mg total) by mouth at bedtime. 02/18/17 05/19/17  Graciella Freer, PA-C  spironolactone (ALDACTONE) 25 MG tablet Take 1 tablet (25 mg total) by mouth daily. 02/06/17   Zannie Cove, MD    Physical Exam: Vitals:   02/18/17 1628 02/18/17 2026 02/18/17 2123  BP: (!) 151/104 (!) 149/90 (!) 157/98  Pulse: 94 85 90  Resp: 16 18 16   Temp: 97.8 F (36.6 C)  97 F (36.1 C)  TempSrc: Oral  Oral  SpO2: 98% 98% 95%  Weight:   202 lb 6.4 oz (91.8 kg)  Height:   5\' 6"  (1.676 m)    Constitutional:  NAD, calm, comfortable Vitals:   02/18/17 1628 02/18/17 2026 02/18/17 2123  BP: (!) 151/104 (!) 149/90 (!) 157/98  Pulse: 94 85 90  Resp: 16 18 16   Temp: 97.8 F (36.6 C)  97 F (36.1 C)  TempSrc: Oral  Oral  SpO2: 98% 98% 95%  Weight:   202 lb 6.4 oz (91.8 kg)  Height:   5\' 6"  (1.676 m)   Eyes: PERRL, lids and conjunctivae normal ENMT: Mucous membranes are mildly dry. Posterior pharynx clear of any exudate or lesions.  Normal dentition.  Respiratory: CTAB, no wheezing, no crackles. Normal respiratory effort. She has a cough productive of white mucus Cardiovascular: Regular rate and rhythm, no murmurs / rubs / gallops. Extremities are thin without edema.  2+ pulses in the DP Abdomen: no tenderness, no masses palpated. No hepatosplenomegaly. Bowel sounds positive.  Musculoskeletal: no clubbing / cyanosis.  Normal muscle tone.  Skin: She has a 1X3cm area of ulceration on the left posterior lower leg with yellow eschar covering, no drainage or bleeding Neurologic: Grossly intact, no acute deficit Psychiatric: Normal judgment and insight. Alert and oriented x 3. Normal mood.   Labs on Admission: I have personally reviewed following labs and imaging studies  CBC:  Recent Labs Lab 02/12/17 1204 02/18/17 1638  WBC 8.3 10.6*  NEUTROABS 5.9 7.6    HGB 15.0 14.1  HCT 47.3* 44.4  MCV 88.4 86.5  PLT 467* 363   Basic Metabolic Panel:  Recent Labs Lab 02/12/17 1204 02/18/17 1638  NA 143 139  K 4.3 4.4  CL 112* 109  CO2 22 18*  GLUCOSE 100* 98  BUN 16 22*  CREATININE 1.22* 1.34*  CALCIUM 9.6 9.6   GFR: Estimated Creatinine Clearance: 47.8 mL/min (A) (by C-G formula based on SCr of 1.34 mg/dL (H)). Liver Function Tests:  Recent Labs Lab 02/12/17 1204 02/18/17 1638  AST 50* 28  ALT 44 21  ALKPHOS 189* 194*  BILITOT 1.1 1.5*  PROT 7.4 7.0  ALBUMIN 3.4* 3.4*   No results for input(s): LIPASE, AMYLASE in the last 168 hours. No results for input(s): AMMONIA in the last 168 hours. Coagulation Profile: No results for input(s): INR, PROTIME in the last 168 hours. Cardiac Enzymes: No results for input(s): CKTOTAL, CKMB, CKMBINDEX, TROPONINI in the last 168 hours. BNP (last 3 results) No results for input(s): PROBNP in the last 8760 hours. HbA1C: No results for input(s): HGBA1C in the last 72 hours. CBG: No results for input(s): GLUCAP in the last 168 hours. Lipid Profile: No results for input(s): CHOL, HDL, LDLCALC, TRIG, CHOLHDL, LDLDIRECT in the last 72 hours. Thyroid Function Tests: No results for input(s): TSH, T4TOTAL, FREET4, T3FREE, THYROIDAB in the last 72 hours. Anemia Panel: No results for input(s): VITAMINB12, FOLATE, FERRITIN, TIBC, IRON, RETICCTPCT in the last 72 hours. Urine analysis:    Component Value Date/Time   COLORURINE YELLOW 01/28/2017 1850   APPEARANCEUR CLEAR 01/28/2017 1850   LABSPEC 1.011 01/28/2017 1850   PHURINE 5.0 01/28/2017 1850   GLUCOSEU NEGATIVE 01/28/2017 1850   HGBUR SMALL (A) 01/28/2017 1850   BILIRUBINUR NEGATIVE 01/28/2017 1850   KETONESUR NEGATIVE 01/28/2017 1850   PROTEINUR 30 (A) 01/28/2017 1850   UROBILINOGEN 1.0 07/25/2015 1601   NITRITE NEGATIVE 01/28/2017 1850   LEUKOCYTESUR NEGATIVE 01/28/2017 1850    Radiological Exams on Admission: No results  found.  Assessment/Plan Intractable nausea and vomiting - DDx includes gastroenteritis, food poisoning, delayed gastric emptying.  I am leaning toward a self limited disorder,  but will need further evaluation - Admitted to continue antibiotics, as she has not been able to keep any down at home for 2 days - Zofran for nausea - Check EKG for QTc, on last EKG in March 467 - Telemetry - Check A1C - WBC with slight increase, trend - IVF with NS at 75cc/hr for 8 hours only, she has significantly decreased EF  Post emesis coughing, sputum production - Check CXR  Cellulitis of left lower leg  - Appears to be healing well - Changed Abx to Cefazolin while getting IV antibiotics - MRSA by PCR at last check was negative - Consider checking MRSA PCR and changing to Vancomycin if appropriate.  - wound care consult    HLD (hyperlipidemia) - Continue statin    Coronary atherosclerosis of native coronary artery - Continue coreg, nitro PRN, brilinta, change torsemide to lasix - Hold losartan, spironolactone for now, can restart as vomiting improves   Essential hypertension - BP has been elevated while here - Continue coreg, change torsemide to lasix - Hold losartan in the setting of mild renal dysfunction - Hydralazine PRN    CKD (chronic kidney disease), stage III - Cr at baseline, but mildly increased BUN likely related to vomiting - Trend renal function    Chronic systolic heart failure - Euvolemic on exam - change torsemide to IV lasix 20mg  TID while in house, next dose tomorrow    GERD (gastroesophageal reflux disease) - Coughing could be related to acute reflux, will start IV famotidine while in house   DVT prophylaxis: Lovenox Code Status: Full Disposition Plan: Obs, quick discharge if improving Consults called: Wound care Admission status: Obs, telemetry   Debe Coder MD [facility] Pager [phone]  If 7PM-7AM, please contact  night-coverage [email or web address] Password [email or web address]  02/18/2017, 11:19 PM

## 2017-02-18 NOTE — ED Notes (Signed)
Pt returns from bathroom 

## 2017-02-19 ENCOUNTER — Encounter (HOSPITAL_COMMUNITY): Payer: Self-pay

## 2017-02-19 DIAGNOSIS — R7309 Other abnormal glucose: Secondary | ICD-10-CM | POA: Diagnosis present

## 2017-02-19 DIAGNOSIS — F17210 Nicotine dependence, cigarettes, uncomplicated: Secondary | ICD-10-CM | POA: Diagnosis present

## 2017-02-19 DIAGNOSIS — I5022 Chronic systolic (congestive) heart failure: Secondary | ICD-10-CM | POA: Diagnosis not present

## 2017-02-19 DIAGNOSIS — I5042 Chronic combined systolic (congestive) and diastolic (congestive) heart failure: Secondary | ICD-10-CM | POA: Diagnosis present

## 2017-02-19 DIAGNOSIS — L03116 Cellulitis of left lower limb: Secondary | ICD-10-CM | POA: Diagnosis present

## 2017-02-19 DIAGNOSIS — N183 Chronic kidney disease, stage 3 (moderate): Secondary | ICD-10-CM | POA: Diagnosis present

## 2017-02-19 DIAGNOSIS — Z955 Presence of coronary angioplasty implant and graft: Secondary | ICD-10-CM | POA: Diagnosis not present

## 2017-02-19 DIAGNOSIS — I130 Hypertensive heart and chronic kidney disease with heart failure and stage 1 through stage 4 chronic kidney disease, or unspecified chronic kidney disease: Secondary | ICD-10-CM | POA: Diagnosis present

## 2017-02-19 DIAGNOSIS — I2510 Atherosclerotic heart disease of native coronary artery without angina pectoris: Secondary | ICD-10-CM

## 2017-02-19 DIAGNOSIS — Z833 Family history of diabetes mellitus: Secondary | ICD-10-CM | POA: Diagnosis not present

## 2017-02-19 DIAGNOSIS — I252 Old myocardial infarction: Secondary | ICD-10-CM | POA: Diagnosis not present

## 2017-02-19 DIAGNOSIS — Z8249 Family history of ischemic heart disease and other diseases of the circulatory system: Secondary | ICD-10-CM | POA: Diagnosis not present

## 2017-02-19 DIAGNOSIS — Z9119 Patient's noncompliance with other medical treatment and regimen: Secondary | ICD-10-CM | POA: Diagnosis not present

## 2017-02-19 DIAGNOSIS — I255 Ischemic cardiomyopathy: Secondary | ICD-10-CM | POA: Diagnosis present

## 2017-02-19 DIAGNOSIS — K219 Gastro-esophageal reflux disease without esophagitis: Secondary | ICD-10-CM | POA: Diagnosis present

## 2017-02-19 DIAGNOSIS — E785 Hyperlipidemia, unspecified: Secondary | ICD-10-CM | POA: Diagnosis present

## 2017-02-19 DIAGNOSIS — K209 Esophagitis, unspecified: Secondary | ICD-10-CM | POA: Diagnosis present

## 2017-02-19 DIAGNOSIS — T364X5A Adverse effect of tetracyclines, initial encounter: Secondary | ICD-10-CM | POA: Diagnosis present

## 2017-02-19 DIAGNOSIS — R112 Nausea with vomiting, unspecified: Secondary | ICD-10-CM | POA: Diagnosis present

## 2017-02-19 LAB — BASIC METABOLIC PANEL
Anion gap: 12 (ref 5–15)
BUN: 22 mg/dL — ABNORMAL HIGH (ref 6–20)
CO2: 20 mmol/L — ABNORMAL LOW (ref 22–32)
Calcium: 9.3 mg/dL (ref 8.9–10.3)
Chloride: 107 mmol/L (ref 101–111)
Creatinine, Ser: 1.3 mg/dL — ABNORMAL HIGH (ref 0.44–1.00)
GFR calc Af Amer: 49 mL/min — ABNORMAL LOW (ref >60)
GFR calc non Af Amer: 42 mL/min — ABNORMAL LOW (ref >60)
Glucose, Bld: 77 mg/dL (ref 65–99)
Potassium: 4.1 mmol/L (ref 3.5–5.1)
Sodium: 139 mmol/L (ref 135–145)

## 2017-02-19 LAB — CBC
HCT: 42.1 % (ref 36.0–46.0)
Hemoglobin: 13.1 g/dL (ref 12.0–15.0)
MCH: 27.2 pg (ref 26.0–34.0)
MCHC: 31.1 g/dL (ref 30.0–36.0)
MCV: 87.5 fL (ref 78.0–100.0)
Platelets: 335 10*3/uL (ref 150–400)
RBC: 4.81 MIL/uL (ref 3.87–5.11)
RDW: 15.7 % — ABNORMAL HIGH (ref 11.5–15.5)
WBC: 10.1 10*3/uL (ref 4.0–10.5)

## 2017-02-19 LAB — GLUCOSE, CAPILLARY
Glucose-Capillary: 86 mg/dL (ref 65–99)
Glucose-Capillary: 129 mg/dL — ABNORMAL HIGH (ref 65–99)

## 2017-02-19 MED ORDER — FAMOTIDINE 20 MG PO TABS
20.0000 mg | ORAL_TABLET | Freq: Two times a day (BID) | ORAL | Status: DC
  Administered 2017-02-19 – 2017-02-21 (×5): 20 mg via ORAL
  Filled 2017-02-19 (×5): qty 1

## 2017-02-19 MED ORDER — SPIRONOLACTONE 25 MG PO TABS
25.0000 mg | ORAL_TABLET | Freq: Every day | ORAL | Status: DC
  Administered 2017-02-20 – 2017-02-21 (×2): 25 mg via ORAL
  Filled 2017-02-19 (×2): qty 1

## 2017-02-19 MED ORDER — TORSEMIDE 20 MG PO TABS
60.0000 mg | ORAL_TABLET | Freq: Every day | ORAL | Status: DC
  Administered 2017-02-20 – 2017-02-21 (×2): 60 mg via ORAL
  Filled 2017-02-19 (×2): qty 3

## 2017-02-19 NOTE — Progress Notes (Signed)
MD took pt's dressing off.  Left lower leg redressed.  Will continue to monitor.

## 2017-02-19 NOTE — Care Management Note (Signed)
Case Management Note  Patient Details  Name: Deborah Ho MRN: [id number] Date of Birth: [birth year]  Subjective/Objective:    Admitted with Intractable N/V; CHF               Action/Plan: Patient is very well known to me from previous admissions; she lives alone; PCP: EDWARDS, Kinnie Scales, NP; has private insurance with Medicare / Monia Pouch with prescription drug coverage; pharmacy of choice is [facility];Patient could benefit from a Disease Management program for CHF, HHC choice offered, pt requested [facility]; Lupita Leash with [facility] called for arrangements; patient stated that she does not need any equipment at this time; CM will continue to follow for DCP.  Expected Discharge Date:  02/21/17               Expected Discharge Plan:  Home w Home Health Services  Discharge planning Services  CM Consult  Choice offered to:  Patient  HH Arranged:  RN, Disease Management, PT HH Agency:  [facility]  Status of Service:  In process, will continue to follow [name] [phone] 02/19/2017, 11:37 AM

## 2017-02-19 NOTE — Progress Notes (Signed)
[facility] PROGRESS NOTE  Deborah Ho UEK:[id number] DOB: 07-30-1951 DOA: 02/18/2017   PCP: Deborah Sessions, NP     Assessment/Plan: Deborah Ho is a 66 y.o. female with a past medical history significant for CHF EF 15%, medical noncompliance, HTN, and CAD s/p PCI/STEMI recently discharged after admission for CHF and cellulitis, diuresed over 30L negative and Treated with ABx, presented with nausea and vomiting since Tuesday.  She reports vomiting 7 times during this stretch along with nausea preceding the vomiting, she was started on DOxycycline 3/31 in ER for cellulitis  Assessment and plan 1. Nausea and Vomiting -suspect this is due to esophagitis from DOxycyline -improved, advance diet, DOxy stopped  2. Chronic systolic CHF -last ECHo in 3/18 with EF 20-25%, moderately dilated RV with moderately decreased systolic function -long h/o poor compliance -stopped taking entresto after discharge and was started on losartan instead at CHF FU -resume torsemide and aldactone -monitor I/Os  3. LLE cellulitis -with open ulcers from ruptured blisters -no overt s./s of active inflammation -stop Abx tomorrow if remains stable -Arterial duoplex last admit unremarkable -FU at [facility] on 4/11  4. Hypertension:  Continue  meds as above for heart failure   5. Coronary artery disease:  -h/o DES to LAD in 04/2014 with readmission the following week for acute in-stent thrombosis -Continue statin, coreg and Brilinta, stable   7. chronic CKD stage III:  Baseline creatinine is 1.5-1.6  -stable  DVT prophylaxsis Lovenox Code Status:  Full code  Family Communication: Discussed in detail with the patient, all imaging results, lab results explained to the patient  Disposition Plan:   Home tomorrow if stable      Consultants:  Cardiology  Procedures: None  Antibiotics: Anti-infectives    Start     Dose/Rate Route Frequency Ordered Stop   02/18/17 2200   ceFAZolin (ANCEF) IVPB 1 g/50 mL premix     1 g 100 mL/hr over 30 Minutes Intravenous Every 8 hours 02/18/17 2035     02/18/17 1830  doxycycline (VIBRAMYCIN) 100 mg in dextrose 5 % 250 mL IVPB  Status:  Discontinued     100 mg 125 mL/hr over 120 Minutes Intravenous  Once 02/18/17 1816 02/19/17 0858         HPI/Subjective:no further N/V, wants to try real food Objective: Vitals:   02/18/17 2123 02/19/17 0636 02/19/17 0740 02/19/17 1156  BP: (!) 157/98 (!) 150/90 123/79 131/77  Pulse: 90 82 81 82  Resp: 16 17 18 18   Temp: 97 F (36.1 C) 97.6 F (36.4 C) 97.3 F (36.3 C) 97.4 F (36.3 C)  TempSrc: Oral Oral Oral Oral  SpO2: 95% 95% 97% 97%  Weight: 91.8 kg (202 lb 6.4 oz) 92.5 kg (204 lb)    Height: 5\' 6"  (1.676 m)       Intake/Output Summary (Last 24 hours) at 02/19/17 1247 Last data filed at 02/19/17 1000  Gross per 24 hour  Intake             1480 ml  Output              200 ml  Net             1280 ml    Exam:  Examination:  General exam: Appears calm and comfortable  Neck: JVP 8 cm Lungs: Clear to auscultation bilaterally with normal respiratory effort. CV: Nondisplaced PMI.  Heart regular S1/S2, no S3/S4, no murmur.  1plus edema, LLE>R  Abdomen: Soft, nontender, no hepatosplenomegaly, mild distention.  Neurologic: Alert and oriented x 3.  Psych: Normal affect. Extremities: LLE with skin breakdown and minimal serous discharge, no erythema/swelling noted now   Data Reviewed: I have personally reviewed following labs and imaging studies  Micro Results Recent Results (from the past 240 hour(s))  Culture, blood (Routine X 2) w Reflex to ID Panel     Status: None (Preliminary result)   Collection Time: 02/18/17  7:14 PM  Result Value Ref Range Status   Specimen Description BLOOD LEFT ANTECUBITAL  Final   Special Requests   Final    BOTTLES DRAWN AEROBIC AND ANAEROBIC Blood Culture results may not be optimal due to an excessive volume of blood received in  culture bottles   Culture PENDING  Incomplete   Report Status PENDING  Incomplete  Culture, blood (Routine X 2) w Reflex to ID Panel     Status: None (Preliminary result)   Collection Time: 02/18/17  7:25 PM  Result Value Ref Range Status   Specimen Description BLOOD LEFT ANTECUBITAL  Final   Special Requests   Final    BOTTLES DRAWN AEROBIC AND ANAEROBIC Blood Culture adequate volume   Culture PENDING  Incomplete   Report Status PENDING  Incomplete    Radiology Reports Dg Chest 2 View  Result Date: 01/28/2017 CLINICAL DATA:  Left leg swelling for 3 days EXAM: CHEST  2 VIEW COMPARISON:  06/26/2016 FINDINGS: There is cardiomegaly with mild central congestion. No large effusion or focal consolidation. No pneumothorax. IMPRESSION: Cardiomegaly with slight central congestion. No acute infiltrate or edema. Electronically Signed   By: Jasmine Pang M.D.   On: 01/28/2017 19:02   Dg Chest Port 1 View  Result Date: 02/19/2017 CLINICAL DATA:  Cough with clear sputum.  Cellulitis. EXAM: PORTABLE CHEST 1 VIEW COMPARISON:  CXR from 01/28/2017 FINDINGS: Stable cardiomegaly with aortic atherosclerosis. No overt pulmonary edema, infiltrate nor effusion. No pneumothorax. Minimal spurring about the [facility] and glenohumeral joints bilaterally. IMPRESSION: Stable cardiomegaly.  No acute pulmonary disease. Electronically Signed   By: Tollie Eth M.D.   On: 02/19/2017 01:40     CBC  Recent Labs Lab 02/18/17 1638 02/19/17 0449  WBC 10.6* 10.1  HGB 14.1 13.1  HCT 44.4 42.1  PLT 363 335  MCV 86.5 87.5  MCH 27.5 27.2  MCHC 31.8 31.1  RDW 15.3 15.7*  LYMPHSABS 2.2  --   MONOABS 0.7  --   EOSABS 0.1  --   BASOSABS 0.1  --     Chemistries   Recent Labs Lab 02/18/17 1638 02/19/17 0449  NA 139 139  K 4.4 4.1  CL 109 107  CO2 18* 20*  GLUCOSE 98 77  BUN 22* 22*  CREATININE 1.34* 1.30*  CALCIUM 9.6 9.3  AST 28  --   ALT 21  --   ALKPHOS 194*  --   BILITOT 1.5*  --     ------------------------------------------------------------------------------------------------------------------ estimated creatinine clearance is 49.4 mL/min (A) (by C-G formula based on SCr of 1.3 mg/dL (H)). ------------------------------------------------------------------------------------------------------------------ No results for input(s): HGBA1C in the last 72 hours. ------------------------------------------------------------------------------------------------------------------ No results for input(s): CHOL, HDL, LDLCALC, TRIG, CHOLHDL, LDLDIRECT in the last 72 hours. ------------------------------------------------------------------------------------------------------------------ No results for input(s): TSH, T4TOTAL, T3FREE, THYROIDAB in the last 72 hours.  Invalid input(s): FREET3 ------------------------------------------------------------------------------------------------------------------ No results for input(s): VITAMINB12, FOLATE, FERRITIN, TIBC, IRON, RETICCTPCT in the last 72 hours.  Coagulation profile No results for input(s): INR, PROTIME in the last 168 hours.  No  results for input(s): DDIMER in the last 72 hours.  Cardiac Enzymes No results for input(s): CKMB, TROPONINI, MYOGLOBIN in the last 168 hours.  Invalid input(s): CK ------------------------------------------------------------------------------------------------------------------ Invalid input(s): POCBNP   CBG:  Recent Labs Lab 02/19/17 0539 02/19/17 1154  GLUCAP 86 129*       Studies: Dg Chest Port 1 View  Result Date: 02/19/2017 CLINICAL DATA:  Cough with clear sputum.  Cellulitis. EXAM: PORTABLE CHEST 1 VIEW COMPARISON:  CXR from 01/28/2017 FINDINGS: Stable cardiomegaly with aortic atherosclerosis. No overt pulmonary edema, infiltrate nor effusion. No pneumothorax. Minimal spurring about the [facility] and glenohumeral joints bilaterally. IMPRESSION: Stable cardiomegaly.  No acute pulmonary  disease. Electronically Signed   By: Tollie Eth M.D.   On: 02/19/2017 01:40      Lab Results  Component Value Date   HGBA1C 5.8 (H) 05/04/2014   Lab Results  Component Value Date   LDLCALC 142 (H) 03/03/2016   CREATININE 1.30 (H) 02/19/2017       Scheduled Meds: . carvedilol  6.25 mg Oral BID WC  .  ceFAZolin (ANCEF) IV  1 g Intravenous Q8H  . enoxaparin (LOVENOX) injection  40 mg Subcutaneous Q24H  . famotidine  20 mg Oral BID  . furosemide  20 mg Intravenous TID  . sodium chloride flush  3 mL Intravenous Q12H  . ticagrelor  60 mg Oral BID   Continuous Infusions:   LOS: 0 days    Time spent: >30 MINS    [facility]  [facility] Pager [phone]. If 7PM-7AM, please contact night-coverage at [email or web address], password [facility] 02/19/2017, 12:47 PM  LOS: 0 days

## 2017-02-19 NOTE — Consult Note (Signed)
WOC Nurse wound consult note Reason for Consult: LLE wound, chronic ABI: 02/10/17 Right ABI of 1.09 and left ABI of 1.17 are suggestive of arterial flow within normal limits at rest.  Wound type: venous stasis with edema and associated cellulitis Pressure Injury POA: No Measurement:3cm x 1.0ccm 0.1cm  Wound bed:100% yellow thin fibrin Drainage (amount, consistency, odor) minimal  Periwound: edema, induration, and erythema   Dressing procedure/placement/frequency: Topical nonadherent with antibacterial properties, review of last admission no compression was ordered for the extremity.  With continued erythema and edema. Patient is noted to have scheduled wound care center appointment on 02/24/17.     Discussed POC with patient and bedside nurse.  Re consult if needed, will not follow at this time. Thanks  Makenleigh Crownover [employer], RN,CWOCN, CNS [phone])

## 2017-02-20 LAB — HEMOGLOBIN A1C
Hgb A1c MFr Bld: 6.2 % — ABNORMAL HIGH (ref 4.8–5.6)
Mean Plasma Glucose: 131 mg/dL

## 2017-02-20 LAB — GLUCOSE, CAPILLARY: Glucose-Capillary: 128 mg/dL — ABNORMAL HIGH (ref 65–99)

## 2017-02-20 LAB — BASIC METABOLIC PANEL
Anion gap: 11 (ref 5–15)
BUN: 29 mg/dL — ABNORMAL HIGH (ref 6–20)
CO2: 21 mmol/L — ABNORMAL LOW (ref 22–32)
Calcium: 9.1 mg/dL (ref 8.9–10.3)
Chloride: 104 mmol/L (ref 101–111)
Creatinine, Ser: 1.45 mg/dL — ABNORMAL HIGH (ref 0.44–1.00)
GFR calc Af Amer: 43 mL/min — ABNORMAL LOW (ref >60)
GFR calc non Af Amer: 37 mL/min — ABNORMAL LOW (ref >60)
Glucose, Bld: 105 mg/dL — ABNORMAL HIGH (ref 65–99)
Potassium: 4.3 mmol/L (ref 3.5–5.1)
Sodium: 136 mmol/L (ref 135–145)

## 2017-02-20 LAB — CBC
HCT: 42.6 % (ref 36.0–46.0)
Hemoglobin: 13.4 g/dL (ref 12.0–15.0)
MCH: 27.9 pg (ref 26.0–34.0)
MCHC: 31.5 g/dL (ref 30.0–36.0)
MCV: 88.8 fL (ref 78.0–100.0)
Platelets: 312 10*3/uL (ref 150–400)
RBC: 4.8 MIL/uL (ref 3.87–5.11)
RDW: 15.5 % (ref 11.5–15.5)
WBC: 8.8 10*3/uL (ref 4.0–10.5)

## 2017-02-20 LAB — VITAMIN B12: Vitamin B-12: 461 pg/mL (ref 180–914)

## 2017-02-20 MED ORDER — CEPHALEXIN 250 MG PO CAPS: 250.0000 mg | ORAL_CAPSULE | Freq: Three times a day (TID) | ORAL | Status: DC

## 2017-02-20 MED ORDER — CEPHALEXIN 250 MG PO CAPS
250.0000 mg | ORAL_CAPSULE | Freq: Three times a day (TID) | ORAL | Status: AC
  Administered 2017-02-20 – 2017-02-21 (×3): 250 mg via ORAL
  Filled 2017-02-20 (×3): qty 1

## 2017-02-20 NOTE — Progress Notes (Signed)
[facility] PROGRESS NOTE  Tiyana Kerkstra TDV:[id number] DOB: [birth year] DOA: 02/18/2017   PCP: Grayce Sessions, NP     Assessment/Plan: Deborah Ho is a 66 y.o. female with a past medical history significant for CHF EF 15%, medical noncompliance, HTN, and CAD s/p PCI/STEMI recently discharged after admission for CHF and cellulitis, diuresed over 30L negative and Treated with ABx, presented with nausea and vomiting since Tuesday.  She reports vomiting 7 times during this stretch along with nausea preceding the vomiting, she was started on DOxycycline 3/31 in ER for cellulitis  Assessment and plan:  1. Nausea and Vomiting -suspect this is due to esophagitis from DOxycyline -improved, advance diet, DOxy stopped  2. Chronic systolic CHF -last ECHo in 3/18 with EF 20-25%, moderately dilated RV with moderately decreased systolic function -long h/o poor compliance -stopped taking entresto after discharge and was started on losartan instead at CHF FU -resume torsemide and aldactone today, Bmet in am -monitor I/Os  3. LLE cellulitis -with open ulcers from ruptured blisters -no overt s/s of active inflammation -change to PO Keflex today for 2days only -Arterial duplex last admit unremarkable -FU at Wound clinic on 4/11  4. Hypertension:  Continue  meds as above for heart failure   5. Coronary artery disease:  -h/o DES to LAD in 04/2014 with readmission the following week for acute in-stent thrombosis -Continue statin, coreg and Brilinta, stable   7. chronic CKD stage III:  Baseline creatinine is 1.5-1.6  -stable  6. Borderline DM -Hba1c 6.2, recommended wt loss and lifestyle modification  DVT prophylaxsis Lovenox Code Status:  Full code  Family Communication: Discussed in detail with the patient, all imaging results, lab results explained to the patient  Disposition Plan:   Home tomorrow if stable       Consultants:  Cardiology  Procedures: None  Antibiotics: Anti-infectives    Start     Dose/Rate Route Frequency Ordered Stop   02/20/17 1000  cephALEXin (KEFLEX) capsule 250 mg     250 mg Oral Every 8 hours 02/20/17 0828 02/21/17 0959   02/20/17 0830  cephALEXin (KEFLEX) capsule 250 mg  Status:  Discontinued     250 mg Oral Every 8 hours 02/20/17 0827 02/20/17 0828   02/18/17 2200  ceFAZolin (ANCEF) IVPB 1 g/50 mL premix  Status:  Discontinued     1 g 100 mL/hr over 30 Minutes Intravenous Every 8 hours 02/18/17 2035 02/20/17 0827   02/18/17 1830  doxycycline (VIBRAMYCIN) 100 mg in dextrose 5 % 250 mL IVPB  Status:  Discontinued     100 mg 125 mL/hr over 120 Minutes Intravenous  Once 02/18/17 1816 02/19/17 0858         HPI/Subjective:no further N/V, wants to try real food Objective: Vitals:   02/19/17 0740 02/19/17 1156 02/19/17 2116 02/20/17 0527  BP: 123/79 131/77 125/78 125/69  Pulse: 81 82 76 72  Resp: 18 18 18 18   Temp: 97.3 F (36.3 C) 97.4 F (36.3 C) 98.6 F (37 C) 98.1 F (36.7 C)  TempSrc: Oral Oral Oral Oral  SpO2: 97% 97% 100% 100%  Weight:    95.2 kg (209 lb 14.4 oz)  Height:        Intake/Output Summary (Last 24 hours) at 02/20/17 1108 Last data filed at 02/20/17 0500  Gross per 24 hour  Intake              360 ml  Output  700 ml  Net             -340 ml    Exam:  Examination:  General exam: Appears calm and comfortable  Neck: JVP 8 cm Lungs: Clear to auscultation bilaterally with normal respiratory effort. CV: Nondisplaced PMI.  Heart regular S1/S2, no S3/S4, no murmur.  1plus edema, LLE>R   Abdomen: Soft, nontender, no hepatosplenomegaly, mild distention.  Neurologic: Alert and oriented x 3.  Psych: Normal affect. Extremities: LLE with skin breakdown and minimal serous discharge, no erythema/swelling noted now   Data Reviewed: I have personally reviewed following labs and imaging studies  Micro Results Recent Results  (from the past 240 hour(s))  Culture, blood (Routine X 2) w Reflex to ID Panel     Status: None (Preliminary result)   Collection Time: 02/18/17  7:14 PM  Result Value Ref Range Status   Specimen Description BLOOD LEFT ANTECUBITAL  Final   Special Requests   Final    BOTTLES DRAWN AEROBIC AND ANAEROBIC Blood Culture results may not be optimal due to an excessive volume of blood received in culture bottles   Culture NO GROWTH < 24 HOURS  Final   Report Status PENDING  Incomplete  Culture, blood (Routine X 2) w Reflex to ID Panel     Status: None (Preliminary result)   Collection Time: 02/18/17  7:25 PM  Result Value Ref Range Status   Specimen Description BLOOD LEFT ANTECUBITAL  Final   Special Requests   Final    BOTTLES DRAWN AEROBIC AND ANAEROBIC Blood Culture adequate volume   Culture NO GROWTH < 24 HOURS  Final   Report Status PENDING  Incomplete    Radiology Reports Dg Chest 2 View  Result Date: 01/28/2017 CLINICAL DATA:  Left leg swelling for 3 days EXAM: CHEST  2 VIEW COMPARISON:  06/26/2016 FINDINGS: There is cardiomegaly with mild central congestion. No large effusion or focal consolidation. No pneumothorax. IMPRESSION: Cardiomegaly with slight central congestion. No acute infiltrate or edema. Electronically Signed   By: Jasmine Pang M.D.   On: 01/28/2017 19:02   Dg Chest Port 1 View  Result Date: 02/19/2017 CLINICAL DATA:  Cough with clear sputum.  Cellulitis. EXAM: PORTABLE CHEST 1 VIEW COMPARISON:  CXR from 01/28/2017 FINDINGS: Stable cardiomegaly with aortic atherosclerosis. No overt pulmonary edema, infiltrate nor effusion. No pneumothorax. Minimal spurring about the [facility] and glenohumeral joints bilaterally. IMPRESSION: Stable cardiomegaly.  No acute pulmonary disease. Electronically Signed   By: Tollie Eth M.D.   On: 02/19/2017 01:40     CBC  Recent Labs Lab 02/18/17 1638 02/19/17 0449 02/20/17 0920  WBC 10.6* 10.1 8.8  HGB 14.1 13.1 13.4  HCT 44.4 42.1 42.6  PLT  363 335 312  MCV 86.5 87.5 88.8  MCH 27.5 27.2 27.9  MCHC 31.8 31.1 31.5  RDW 15.3 15.7* 15.5  LYMPHSABS 2.2  --   --   MONOABS 0.7  --   --   EOSABS 0.1  --   --   BASOSABS 0.1  --   --     Chemistries   Recent Labs Lab 02/18/17 1638 02/19/17 0449 02/20/17 0920  NA 139 139 136  K 4.4 4.1 4.3  CL 109 107 104  CO2 18* 20* 21*  GLUCOSE 98 77 105*  BUN 22* 22* 29*  CREATININE 1.34* 1.30* 1.45*  CALCIUM 9.6 9.3 9.1  AST 28  --   --   ALT 21  --   --  ALKPHOS 194*  --   --   BILITOT 1.5*  --   --    ------------------------------------------------------------------------------------------------------------------ estimated creatinine clearance is 45 mL/min (A) (by C-G formula based on SCr of 1.45 mg/dL (H)). ------------------------------------------------------------------------------------------------------------------  Recent Labs  02/19/17 0450  HGBA1C 6.2*   ------------------------------------------------------------------------------------------------------------------ No results for input(s): CHOL, HDL, LDLCALC, TRIG, CHOLHDL, LDLDIRECT in the last 72 hours. ------------------------------------------------------------------------------------------------------------------ No results for input(s): TSH, T4TOTAL, T3FREE, THYROIDAB in the last 72 hours.  Invalid input(s): FREET3 ------------------------------------------------------------------------------------------------------------------ No results for input(s): VITAMINB12, FOLATE, FERRITIN, TIBC, IRON, RETICCTPCT in the last 72 hours.  Coagulation profile No results for input(s): INR, PROTIME in the last 168 hours.  No results for input(s): DDIMER in the last 72 hours.  Cardiac Enzymes No results for input(s): CKMB, TROPONINI, MYOGLOBIN in the last 168 hours.  Invalid input(s): CK ------------------------------------------------------------------------------------------------------------------ Invalid  input(s): POCBNP   CBG:  Recent Labs Lab 02/19/17 0539 02/19/17 1154 02/20/17 0645  GLUCAP 86 129* 128*       Studies: Dg Chest Port 1 View  Result Date: 02/19/2017 CLINICAL DATA:  Cough with clear sputum.  Cellulitis. EXAM: PORTABLE CHEST 1 VIEW COMPARISON:  CXR from 01/28/2017 FINDINGS: Stable cardiomegaly with aortic atherosclerosis. No overt pulmonary edema, infiltrate nor effusion. No pneumothorax. Minimal spurring about the [facility] and glenohumeral joints bilaterally. IMPRESSION: Stable cardiomegaly.  No acute pulmonary disease. Electronically Signed   By: Tollie Eth M.D.   On: 02/19/2017 01:40      Lab Results  Component Value Date   HGBA1C 6.2 (H) 02/19/2017   HGBA1C 5.8 (H) 05/04/2014   Lab Results  Component Value Date   LDLCALC 142 (H) 03/03/2016   CREATININE 1.45 (H) 02/20/2017       Scheduled Meds: . carvedilol  6.25 mg Oral BID WC  . cephALEXin  250 mg Oral Q8H  . enoxaparin (LOVENOX) injection  40 mg Subcutaneous Q24H  . famotidine  20 mg Oral BID  . sodium chloride flush  3 mL Intravenous Q12H  . spironolactone  25 mg Oral Daily  . ticagrelor  60 mg Oral BID  . torsemide  60 mg Oral Daily   Continuous Infusions:   LOS: 1 day    Time spent: >30 MINS    [facility]  [facility] Pager [phone]. If 7PM-7AM, please contact night-coverage at [email or web address], password [facility] 02/20/2017, 11:08 AM  LOS: 1 day

## 2017-02-21 LAB — BASIC METABOLIC PANEL
Anion gap: 7 (ref 5–15)
BUN: 30 mg/dL — ABNORMAL HIGH (ref 6–20)
CO2: 24 mmol/L (ref 22–32)
Calcium: 9 mg/dL (ref 8.9–10.3)
Chloride: 105 mmol/L (ref 101–111)
Creatinine, Ser: 1.32 mg/dL — ABNORMAL HIGH (ref 0.44–1.00)
GFR calc Af Amer: 48 mL/min — ABNORMAL LOW (ref >60)
GFR calc non Af Amer: 41 mL/min — ABNORMAL LOW (ref >60)
Glucose, Bld: 104 mg/dL — ABNORMAL HIGH (ref 65–99)
Potassium: 5.1 mmol/L (ref 3.5–5.1)
Sodium: 136 mmol/L (ref 135–145)

## 2017-02-21 MED ORDER — LORATADINE 10 MG PO TABS
10.0000 mg | ORAL_TABLET | Freq: Every day | ORAL | Status: DC
  Administered 2017-02-21: 10 mg via ORAL
  Filled 2017-02-21: qty 1

## 2017-02-21 NOTE — Discharge Summary (Signed)
Physician Discharge Summary  Deborah Ho ZHG:[id number] DOB: 08-Mar-1951 DOA: 02/18/2017  PCP: Grayce Sessions, NP  Admit date: 02/18/2017 Discharge date: 02/21/2017  Time spent: 45 minutes  Recommendations for Outpatient Follow-up:  1. PCP in 1 week, Please check Bmet at follow up to check on potassium, if stable could resume low dose losartan 2. Heart failure team in 1-2weeks, please resume ARB if K stable  Discharge Diagnoses:  Active Problems:   HLD (hyperlipidemia)   Coronary atherosclerosis of native coronary artery   Essential hypertension   CKD (chronic kidney disease), stage III   Chronic systolic heart failure (HCC)   Cellulitis of left lower leg   Intractable nausea and vomiting   GERD (gastroesophageal reflux disease)   Cellulitis of left lower extremity   Cough productive of clear sputum   Discharge Condition: stable  Diet recommendation: Heart healthy  Filed Weights   02/19/17 0636 02/20/17 0527 02/21/17 0510  Weight: 92.5 kg (204 lb) 95.2 kg (209 lb 14.4 oz) 96 kg (211 lb 11.2 oz)    History of present illness:  Deborah McCallumis a 66 y.o.femalewith a past medical history significant for CHF EF 15%, medical noncompliance, HTN, and CAD s/p PCI/STEMIrecently discharged after admission for CHF and cellulitis, diuresed over 30L negative and Treated with ABx, presented with nausea and vomiting since Tuesday. She reports vomiting 7 times during this stretch along with nausea preceding the vomiting, she was started on DOxycycline 3/31 in ER for cellulitis   Hospital Course:  1. Nausea and Vomiting -suspect this is due to esophagitis from DOxycyline -improved, advance diet, DOxy stopped  2. Chronic systolic CHF -last ECHo in 3/18 with EF 20-25%, moderately dilated RV with moderately decreased systolic function -long h/o poor compliance -stopped taking entresto after recent discharge and was started on losartan instead at CHF clinic FU -resumed torsemide  and aldactone inpatient , stable and euvolemic at the time of discharge, held cozaar on discharge due to soft BPs and high K, can be resumed at FU depending on K and BP  3. Recent LLE cellulitis -with open ulcers from ruptured blisters -no overt s/s of active inflammation -treated with PO Keflex  for 2days only -Arterial duplex last admit unremarkable -FU at Wound clinic on 4/11  4. Hypertension: Continue  meds as above for heart failure   5. Coronary artery disease: -h/o DES to LAD in 04/2014 with readmission the following week for acute in-stent thrombosis -Continue statin, coreg and Brilinta, stable   7. chronic CKD stage III:  Baseline creatinine is 1.5-1.6  -stable  6. Borderline DM -Hba1c 6.2, recommended wt loss and lifestyle modification   Discharge Exam: Vitals:   02/20/17 1915 02/21/17 0510  BP: 130/76 123/75  Pulse: 77 76  Resp: 18 18  Temp: 98.4 F (36.9 C) 97.6 F (36.4 C)    General: AAOx3 Cardiovascular: AS1S2./RRR Respiratory: CTAB  Discharge Instructions   Discharge Instructions    Diet - low sodium heart healthy    Complete by:  As directed    Diet Carb Modified    Complete by:  As directed    Increase activity slowly    Complete by:  As directed      Current Discharge Medication List    CONTINUE these medications which have NOT CHANGED   Details  atorvastatin (LIPITOR) 80 MG tablet Take 1 tablet (80 mg total) by mouth daily at 6 PM. Qty: 30 tablet, Refills: 0    carvedilol (COREG) 6.25 MG tablet Take 1  tablet (6.25 mg total) by mouth 2 (two) times daily with a meal. Qty: 30 tablet, Refills: 0    cetirizine (ZYRTEC) 10 MG tablet Take 10 mg by mouth daily as needed for allergies.     ferrous sulfate 325 (65 FE) MG tablet Take 325 mg by mouth 2 (two) times a week. Tuesdays and Thursdays    HYDROcodone-acetaminophen (NORCO/VICODIN) 5-325 MG tablet Take 1-2 tablets by mouth every 4 (four) hours as needed for moderate pain or  severe pain. Qty: 20 tablet, Refills: 0    nitroGLYCERIN (NITROSTAT) 0.4 MG SL tablet Place 0.4 mg under the tongue every 5 (five) minutes as needed for chest pain.    ticagrelor (BRILINTA) 60 MG TABS tablet Take 1 tablet (60 mg total) by mouth 2 (two) times daily. Qty: 60 tablet, Refills: 6    torsemide (DEMADEX) 20 MG tablet Take 3 tablets (60 mg total) by mouth daily. Qty: 30 tablet, Refills: 0    spironolactone (ALDACTONE) 25 MG tablet Take 1 tablet (25 mg total) by mouth daily. Qty: 30 tablet, Refills: 0      STOP taking these medications     doxycycline (VIBRAMYCIN) 100 MG capsule      losartan (COZAAR) 25 MG tablet        Allergies  Allergen Reactions  . Aspirin Swelling    Chewable children's aspirin makes patients tongue and face swell  . Effient [Prasugrel] Swelling    Patient's tongue and face swells  . Entresto [Sacubitril-Valsartan] Other (See Comments)    dizziness  . Lactose Intolerance (Gi) Other (See Comments)    REACTION: stomach upset  . Robitussin Dm [Guaifenesin-Dm] Swelling    Patient's tongue swells  . Sulfa Antibiotics Swelling  . Wheat Bran Other (See Comments)    REACTION: unknown   Follow-up Information    [facility] Follow up.   Why:  They will do your home health care at your home Contact information: [address] [place] [address] [phone]        EDWARDS, Kinnie Scales, NP. Schedule an appointment as soon as possible for a visit in 1 week(s).   Specialty:  Internal Medicine Why:  Please check Bmet at follow up to check on potassium, if stable could resume low dose losartan Contact information: [address] [place] [address] [phone]            The results of significant diagnostics from this hospitalization (including imaging, microbiology, ancillary and laboratory) are listed below for reference.    Significant Diagnostic Studies: Dg Chest 2 View  Result Date:  01/28/2017 CLINICAL DATA:  Left leg swelling for 3 days EXAM: CHEST  2 VIEW COMPARISON:  06/26/2016 FINDINGS: There is cardiomegaly with mild central congestion. No large effusion or focal consolidation. No pneumothorax. IMPRESSION: Cardiomegaly with slight central congestion. No acute infiltrate or edema. Electronically Signed   By: Jasmine Pang M.D.   On: 01/28/2017 19:02   Dg Chest Port 1 View  Result Date: 02/19/2017 CLINICAL DATA:  Cough with clear sputum.  Cellulitis. EXAM: PORTABLE CHEST 1 VIEW COMPARISON:  CXR from 01/28/2017 FINDINGS: Stable cardiomegaly with aortic atherosclerosis. No overt pulmonary edema, infiltrate nor effusion. No pneumothorax. Minimal spurring about the [facility] and glenohumeral joints bilaterally. IMPRESSION: Stable cardiomegaly.  No acute pulmonary disease. Electronically Signed   By: Tollie Eth M.D.   On: 02/19/2017 01:40    Microbiology: Recent Results (from the past 240 hour(s))  Culture, blood (Routine X 2) w  Reflex to ID Panel     Status: None (Preliminary result)   Collection Time: 02/18/17  7:14 PM  Result Value Ref Range Status   Specimen Description BLOOD LEFT ANTECUBITAL  Final   Special Requests   Final    BOTTLES DRAWN AEROBIC AND ANAEROBIC Blood Culture results may not be optimal due to an excessive volume of blood received in culture bottles   Culture NO GROWTH 2 DAYS  Final   Report Status PENDING  Incomplete  Culture, blood (Routine X 2) w Reflex to ID Panel     Status: None (Preliminary result)   Collection Time: 02/18/17  7:25 PM  Result Value Ref Range Status   Specimen Description BLOOD LEFT ANTECUBITAL  Final   Special Requests   Final    BOTTLES DRAWN AEROBIC AND ANAEROBIC Blood Culture adequate volume   Culture NO GROWTH 2 DAYS  Final   Report Status PENDING  Incomplete     Labs: Basic Metabolic Panel:  Recent Labs Lab 02/18/17 1638 02/19/17 0449 02/20/17 0920 02/21/17 0222  NA 139 139 136 136  K 4.4 4.1 4.3 5.1  CL 109 107 104  105  CO2 18* 20* 21* 24  GLUCOSE 98 77 105* 104*  BUN 22* 22* 29* 30*  CREATININE 1.34* 1.30* 1.45* 1.32*  CALCIUM 9.6 9.3 9.1 9.0   Liver Function Tests:  Recent Labs Lab 02/18/17 1638  AST 28  ALT 21  ALKPHOS 194*  BILITOT 1.5*  PROT 7.0  ALBUMIN 3.4*   No results for input(s): LIPASE, AMYLASE in the last 168 hours. No results for input(s): AMMONIA in the last 168 hours. CBC:  Recent Labs Lab 02/18/17 1638 02/19/17 0449 02/20/17 0920  WBC 10.6* 10.1 8.8  NEUTROABS 7.6  --   --   HGB 14.1 13.1 13.4  HCT 44.4 42.1 42.6  MCV 86.5 87.5 88.8  PLT 363 335 312   Cardiac Enzymes: No results for input(s): CKTOTAL, CKMB, CKMBINDEX, TROPONINI in the last 168 hours. BNP: BNP (last 3 results)  Recent Labs  06/26/16 1441 08/06/16 1600 01/28/17 2047  BNP 1,443.5* 263.7* 1,920.0*    ProBNP (last 3 results) No results for input(s): PROBNP in the last 8760 hours.  CBG:  Recent Labs Lab 02/19/17 0539 02/19/17 1154 02/20/17 0645  GLUCAP 86 129* 128*       SignedZannie Cove MD.  [facility] 02/21/2017, 10:50 AM

## 2017-02-21 NOTE — Discharge Summary (Signed)
Pt got discharged to home, discharge instructions provided and patient showed understanding to it, IV taken out,Telemonitor DC,pt left unit in wheelchair with all of the belongings. Cab voucher provided by case manager and home health is managed.

## 2017-02-21 NOTE — Progress Notes (Signed)
Pt states she has no one to take her home. CM notified CSW for a taxi voucher as pt has a leg wound and cannot take bus. No other CM needs were communicated.

## 2017-02-22 LAB — GLUCOSE, CAPILLARY: Glucose-Capillary: 115 mg/dL — ABNORMAL HIGH (ref 65–99)

## 2017-02-23 ENCOUNTER — Telehealth (HOSPITAL_COMMUNITY): Payer: Self-pay | Admitting: *Deleted

## 2017-02-23 LAB — CULTURE, BLOOD (ROUTINE X 2)
Culture: NO GROWTH
Culture: NO GROWTH
Special Requests: ADEQUATE

## 2017-02-23 NOTE — Telephone Encounter (Signed)
Pt called c/o nausea/vomiting/diarrhea, she states this has been going on for weeks now and she is not sure what is causing it.  She was just in hospital for cellulitis and they stopped Doxy thinking it could be culprit, pt states she was on several antibiotics but is not on any at this time.  Pt states wt is stable since d/c, no sob or edema.  Advised to f/u w/pcp she states she called their office and they are closed today and tomorrow, advised needs appt w/them or urgent care

## 2017-02-24 ENCOUNTER — Encounter (HOSPITAL_BASED_OUTPATIENT_CLINIC_OR_DEPARTMENT_OTHER): Payer: Medicare Other | Attending: Surgery

## 2017-02-24 DIAGNOSIS — I255 Ischemic cardiomyopathy: Secondary | ICD-10-CM | POA: Diagnosis not present

## 2017-02-24 DIAGNOSIS — N183 Chronic kidney disease, stage 3 (moderate): Secondary | ICD-10-CM | POA: Insufficient documentation

## 2017-02-24 DIAGNOSIS — L97222 Non-pressure chronic ulcer of left calf with fat layer exposed: Secondary | ICD-10-CM | POA: Insufficient documentation

## 2017-02-24 DIAGNOSIS — I2510 Atherosclerotic heart disease of native coronary artery without angina pectoris: Secondary | ICD-10-CM | POA: Insufficient documentation

## 2017-02-24 DIAGNOSIS — Z87891 Personal history of nicotine dependence: Secondary | ICD-10-CM | POA: Insufficient documentation

## 2017-02-24 DIAGNOSIS — Z955 Presence of coronary angioplasty implant and graft: Secondary | ICD-10-CM | POA: Diagnosis not present

## 2017-02-24 DIAGNOSIS — L97221 Non-pressure chronic ulcer of left calf limited to breakdown of skin: Secondary | ICD-10-CM | POA: Diagnosis present

## 2017-02-24 DIAGNOSIS — I129 Hypertensive chronic kidney disease with stage 1 through stage 4 chronic kidney disease, or unspecified chronic kidney disease: Secondary | ICD-10-CM | POA: Insufficient documentation

## 2017-02-24 DIAGNOSIS — I252 Old myocardial infarction: Secondary | ICD-10-CM | POA: Insufficient documentation

## 2017-02-24 DIAGNOSIS — G4730 Sleep apnea, unspecified: Secondary | ICD-10-CM | POA: Insufficient documentation

## 2017-02-26 ENCOUNTER — Telehealth (HOSPITAL_COMMUNITY): Payer: Self-pay | Admitting: *Deleted

## 2017-02-26 NOTE — Telephone Encounter (Signed)
Selena Batten, RN with [facility] called to report that on Monday patient weighed 202 lbs.  Then on Wednesday she was 210 lbs and today patient weighed 207 lbs.  She also reported that pt's BP was 150/110 and 148/108 but patient was complaining of severe pain from her wound in which she advised patient to contact the wound center for pain medication.    I spoke with Otilio Saber, PA and since patient is asymptomatic from weight gain to have Kim to just call us back on her visit with patient next Tuesday if weight continues to increase or if BP is still high. Selena Batten is aware and will continue to follow patient and report back to [place] as needed.

## 2017-03-03 DIAGNOSIS — L97222 Non-pressure chronic ulcer of left calf with fat layer exposed: Secondary | ICD-10-CM | POA: Diagnosis not present

## 2017-03-05 ENCOUNTER — Other Ambulatory Visit: Payer: Self-pay | Admitting: Vascular Surgery

## 2017-03-05 DIAGNOSIS — R600 Localized edema: Secondary | ICD-10-CM

## 2017-03-10 DIAGNOSIS — L97222 Non-pressure chronic ulcer of left calf with fat layer exposed: Secondary | ICD-10-CM | POA: Diagnosis not present

## 2017-03-17 ENCOUNTER — Encounter (HOSPITAL_BASED_OUTPATIENT_CLINIC_OR_DEPARTMENT_OTHER): Payer: Medicare Other | Attending: Surgery

## 2017-03-17 DIAGNOSIS — Z09 Encounter for follow-up examination after completed treatment for conditions other than malignant neoplasm: Secondary | ICD-10-CM | POA: Insufficient documentation

## 2017-03-17 DIAGNOSIS — G4730 Sleep apnea, unspecified: Secondary | ICD-10-CM | POA: Diagnosis not present

## 2017-03-17 DIAGNOSIS — Z872 Personal history of diseases of the skin and subcutaneous tissue: Secondary | ICD-10-CM | POA: Insufficient documentation

## 2017-03-17 DIAGNOSIS — I10 Essential (primary) hypertension: Secondary | ICD-10-CM | POA: Insufficient documentation

## 2017-03-17 DIAGNOSIS — I252 Old myocardial infarction: Secondary | ICD-10-CM | POA: Insufficient documentation

## 2017-03-26 ENCOUNTER — Encounter (HOSPITAL_COMMUNITY): Payer: Self-pay

## 2017-03-26 ENCOUNTER — Emergency Department (HOSPITAL_COMMUNITY): Payer: Medicare Other

## 2017-03-26 ENCOUNTER — Encounter (HOSPITAL_COMMUNITY): Payer: Self-pay | Admitting: Emergency Medicine

## 2017-03-26 ENCOUNTER — Inpatient Hospital Stay (HOSPITAL_COMMUNITY)
Admission: EM | Admit: 2017-03-26 | Discharge: 2017-04-02 | DRG: 291 | Disposition: A | Discharge status: Home Health | Payer: Medicare Other | Attending: Internal Medicine | Admitting: Internal Medicine

## 2017-03-26 DIAGNOSIS — Z91148 Patient's other noncompliance with medication regimen for other reason: Secondary | ICD-10-CM

## 2017-03-26 DIAGNOSIS — I252 Old myocardial infarction: Secondary | ICD-10-CM

## 2017-03-26 DIAGNOSIS — I5043 Acute on chronic combined systolic (congestive) and diastolic (congestive) heart failure: Secondary | ICD-10-CM | POA: Diagnosis present

## 2017-03-26 DIAGNOSIS — Z79899 Other long term (current) drug therapy: Secondary | ICD-10-CM | POA: Diagnosis not present

## 2017-03-26 DIAGNOSIS — Z882 Allergy status to sulfonamides status: Secondary | ICD-10-CM | POA: Diagnosis not present

## 2017-03-26 DIAGNOSIS — I255 Ischemic cardiomyopathy: Secondary | ICD-10-CM | POA: Diagnosis present

## 2017-03-26 DIAGNOSIS — T501X6A Underdosing of loop [high-ceiling] diuretics, initial encounter: Secondary | ICD-10-CM | POA: Diagnosis present

## 2017-03-26 DIAGNOSIS — Z9114 Patient's other noncompliance with medication regimen: Secondary | ICD-10-CM

## 2017-03-26 DIAGNOSIS — R601 Generalized edema: Secondary | ICD-10-CM | POA: Diagnosis present

## 2017-03-26 DIAGNOSIS — I5021 Acute systolic (congestive) heart failure: Secondary | ICD-10-CM

## 2017-03-26 DIAGNOSIS — I10 Essential (primary) hypertension: Secondary | ICD-10-CM | POA: Diagnosis not present

## 2017-03-26 DIAGNOSIS — Z91018 Allergy to other foods: Secondary | ICD-10-CM

## 2017-03-26 DIAGNOSIS — K219 Gastro-esophageal reflux disease without esophagitis: Secondary | ICD-10-CM | POA: Diagnosis present

## 2017-03-26 DIAGNOSIS — Z955 Presence of coronary angioplasty implant and graft: Secondary | ICD-10-CM

## 2017-03-26 DIAGNOSIS — I2510 Atherosclerotic heart disease of native coronary artery without angina pectoris: Secondary | ICD-10-CM | POA: Diagnosis present

## 2017-03-26 DIAGNOSIS — I130 Hypertensive heart and chronic kidney disease with heart failure and stage 1 through stage 4 chronic kidney disease, or unspecified chronic kidney disease: Secondary | ICD-10-CM | POA: Diagnosis present

## 2017-03-26 DIAGNOSIS — R188 Other ascites: Secondary | ICD-10-CM | POA: Diagnosis present

## 2017-03-26 DIAGNOSIS — N183 Chronic kidney disease, stage 3 unspecified: Secondary | ICD-10-CM | POA: Diagnosis present

## 2017-03-26 DIAGNOSIS — Z888 Allergy status to other drugs, medicaments and biological substances status: Secondary | ICD-10-CM | POA: Diagnosis not present

## 2017-03-26 DIAGNOSIS — Z7901 Long term (current) use of anticoagulants: Secondary | ICD-10-CM | POA: Diagnosis not present

## 2017-03-26 DIAGNOSIS — I5023 Acute on chronic systolic (congestive) heart failure: Secondary | ICD-10-CM | POA: Diagnosis not present

## 2017-03-26 DIAGNOSIS — Z886 Allergy status to analgesic agent status: Secondary | ICD-10-CM

## 2017-03-26 DIAGNOSIS — I509 Heart failure, unspecified: Secondary | ICD-10-CM

## 2017-03-26 DIAGNOSIS — E739 Lactose intolerance, unspecified: Secondary | ICD-10-CM | POA: Diagnosis present

## 2017-03-26 DIAGNOSIS — R17 Unspecified jaundice: Secondary | ICD-10-CM | POA: Diagnosis present

## 2017-03-26 DIAGNOSIS — Z9111 Patient's noncompliance with dietary regimen: Secondary | ICD-10-CM | POA: Diagnosis not present

## 2017-03-26 DIAGNOSIS — E876 Hypokalemia: Secondary | ICD-10-CM | POA: Diagnosis present

## 2017-03-26 DIAGNOSIS — F17210 Nicotine dependence, cigarettes, uncomplicated: Secondary | ICD-10-CM | POA: Diagnosis present

## 2017-03-26 DIAGNOSIS — E785 Hyperlipidemia, unspecified: Secondary | ICD-10-CM | POA: Diagnosis present

## 2017-03-26 DIAGNOSIS — R140 Abdominal distension (gaseous): Secondary | ICD-10-CM | POA: Diagnosis present

## 2017-03-26 LAB — HEPATIC FUNCTION PANEL
ALT: 14 U/L (ref 14–54)
AST: 25 U/L (ref 15–41)
Albumin: 3.4 g/dL — ABNORMAL LOW (ref 3.5–5.0)
Alkaline Phosphatase: 199 U/L — ABNORMAL HIGH (ref 38–126)
Bilirubin, Direct: 0.8 mg/dL — ABNORMAL HIGH (ref 0.1–0.5)
Indirect Bilirubin: 1.2 mg/dL — ABNORMAL HIGH (ref 0.3–0.9)
Total Bilirubin: 2 mg/dL — ABNORMAL HIGH (ref 0.3–1.2)
Total Protein: 6.9 g/dL (ref 6.5–8.1)

## 2017-03-26 LAB — I-STAT TROPONIN, ED: Troponin i, poc: 0 ng/mL (ref 0.00–0.08)

## 2017-03-26 LAB — BASIC METABOLIC PANEL
Anion gap: 9 (ref 5–15)
BUN: 17 mg/dL (ref 6–20)
CO2: 22 mmol/L (ref 22–32)
Calcium: 9 mg/dL (ref 8.9–10.3)
Chloride: 111 mmol/L (ref 101–111)
Creatinine, Ser: 1.25 mg/dL — ABNORMAL HIGH (ref 0.44–1.00)
GFR calc Af Amer: 51 mL/min — ABNORMAL LOW (ref >60)
GFR calc non Af Amer: 44 mL/min — ABNORMAL LOW (ref >60)
Glucose, Bld: 96 mg/dL (ref 65–99)
Potassium: 4.3 mmol/L (ref 3.5–5.1)
Sodium: 142 mmol/L (ref 135–145)

## 2017-03-26 LAB — CBC
HCT: 46 % (ref 36.0–46.0)
Hemoglobin: 14 g/dL (ref 12.0–15.0)
MCH: 25.9 pg — ABNORMAL LOW (ref 26.0–34.0)
MCHC: 30.4 g/dL (ref 30.0–36.0)
MCV: 85 fL (ref 78.0–100.0)
Platelets: 331 10*3/uL (ref 150–400)
RBC: 5.41 MIL/uL — ABNORMAL HIGH (ref 3.87–5.11)
RDW: 17 % — ABNORMAL HIGH (ref 11.5–15.5)
WBC: 8.5 10*3/uL (ref 4.0–10.5)

## 2017-03-26 LAB — BRAIN NATRIURETIC PEPTIDE: B Natriuretic Peptide: 1679.1 pg/mL — ABNORMAL HIGH (ref 0.0–100.0)

## 2017-03-26 MED ORDER — PANTOPRAZOLE SODIUM 20 MG PO TBEC
20.0000 mg | DELAYED_RELEASE_TABLET | Freq: Every day | ORAL | Status: DC
  Administered 2017-03-27 – 2017-04-02 (×7): 20 mg via ORAL
  Filled 2017-03-26 (×7): qty 1

## 2017-03-26 MED ORDER — SODIUM CHLORIDE 0.9% FLUSH
3.0000 mL | Freq: Two times a day (BID) | INTRAVENOUS | Status: DC
  Administered 2017-03-26 – 2017-04-02 (×11): 3 mL via INTRAVENOUS

## 2017-03-26 MED ORDER — ACETAMINOPHEN 650 MG RE SUPP: 650.0000 mg | Freq: Four times a day (QID) | RECTAL | Status: DC | PRN

## 2017-03-26 MED ORDER — LORATADINE 10 MG PO TABS
10.0000 mg | ORAL_TABLET | Freq: Every day | ORAL | Status: DC
  Administered 2017-03-27 – 2017-04-02 (×7): 10 mg via ORAL
  Filled 2017-03-26 (×7): qty 1

## 2017-03-26 MED ORDER — ACETAMINOPHEN 325 MG PO TABS: 650.0000 mg | ORAL_TABLET | Freq: Four times a day (QID) | ORAL | Status: DC | PRN

## 2017-03-26 MED ORDER — SODIUM CHLORIDE 0.9 % IV SOLN: 250.0000 mL | INTRAVENOUS | Status: DC | PRN

## 2017-03-26 MED ORDER — NITROGLYCERIN 0.4 MG SL SUBL: 0.4000 mg | SUBLINGUAL_TABLET | SUBLINGUAL | Status: DC | PRN

## 2017-03-26 MED ORDER — ONDANSETRON HCL 4 MG PO TABS: 4.0000 mg | ORAL_TABLET | Freq: Four times a day (QID) | ORAL | Status: DC | PRN

## 2017-03-26 MED ORDER — CARVEDILOL 6.25 MG PO TABS
6.2500 mg | ORAL_TABLET | Freq: Two times a day (BID) | ORAL | Status: DC
  Administered 2017-03-27 – 2017-04-02 (×11): 6.25 mg via ORAL
  Filled 2017-03-26 (×14): qty 1

## 2017-03-26 MED ORDER — TICAGRELOR 60 MG PO TABS
60.0000 mg | ORAL_TABLET | Freq: Two times a day (BID) | ORAL | Status: DC
  Administered 2017-03-26 – 2017-04-02 (×14): 60 mg via ORAL
  Filled 2017-03-26 (×15): qty 1

## 2017-03-26 MED ORDER — ONDANSETRON HCL 4 MG/2ML IJ SOLN: 4.0000 mg | Freq: Four times a day (QID) | INTRAMUSCULAR | Status: DC | PRN

## 2017-03-26 MED ORDER — SODIUM CHLORIDE 0.9% FLUSH
3.0000 mL | Freq: Two times a day (BID) | INTRAVENOUS | Status: DC
  Administered 2017-03-27 – 2017-04-02 (×8): 3 mL via INTRAVENOUS

## 2017-03-26 MED ORDER — ENOXAPARIN SODIUM 40 MG/0.4ML ~~LOC~~ SOLN
40.0000 mg | SUBCUTANEOUS | Status: DC
  Administered 2017-03-27 – 2017-03-30 (×4): 40 mg via SUBCUTANEOUS
  Filled 2017-03-26 (×4): qty 0.4

## 2017-03-26 MED ORDER — SPIRONOLACTONE 25 MG PO TABS
25.0000 mg | ORAL_TABLET | Freq: Every day | ORAL | Status: DC
  Administered 2017-03-27 – 2017-04-02 (×7): 25 mg via ORAL
  Filled 2017-03-26 (×7): qty 1

## 2017-03-26 MED ORDER — FERROUS SULFATE 325 (65 FE) MG PO TABS
325.0000 mg | ORAL_TABLET | ORAL | Status: DC
  Administered 2017-03-29 – 2017-04-01 (×2): 325 mg via ORAL
  Filled 2017-03-26 (×2): qty 1

## 2017-03-26 MED ORDER — FUROSEMIDE 10 MG/ML IJ SOLN
80.0000 mg | Freq: Once | INTRAMUSCULAR | Status: AC
  Administered 2017-03-26: 80 mg via INTRAVENOUS
  Filled 2017-03-26: qty 8

## 2017-03-26 MED ORDER — FUROSEMIDE 10 MG/ML IJ SOLN
80.0000 mg | Freq: Two times a day (BID) | INTRAMUSCULAR | Status: DC
  Administered 2017-03-27 – 2017-03-28 (×3): 80 mg via INTRAVENOUS
  Filled 2017-03-26 (×3): qty 8

## 2017-03-26 MED ORDER — ATORVASTATIN CALCIUM 80 MG PO TABS
80.0000 mg | ORAL_TABLET | Freq: Every day | ORAL | Status: DC
  Administered 2017-03-26 – 2017-04-02 (×7): 80 mg via ORAL
  Filled 2017-03-26 (×8): qty 1

## 2017-03-26 MED ORDER — SODIUM CHLORIDE 0.9% FLUSH: 3.0000 mL | INTRAVENOUS | Status: DC | PRN

## 2017-03-26 NOTE — ED Triage Notes (Signed)
Pt reports hx of chf, states swelling in her belly x 1 week, reports she has been taking torsemide as prescribed. Pt denies sob or chest pain.

## 2017-03-26 NOTE — ED Notes (Signed)
Patient is stable and ready to be transport to the floor at this time.  Report was called to 3E RN.  Belongings taken with the patient to the floor.   

## 2017-03-26 NOTE — ED Notes (Signed)
attempted report 

## 2017-03-26 NOTE — Progress Notes (Signed)
   03/26/17 2100  Vitals  Temp 97.5 F (36.4 C)  Temp Source Oral  BP (!) 163/91  BP Location Left Arm  BP Method Automatic  Patient Position (if appropriate) Sitting  Pulse Rate 89  Pulse Rate Source Dinamap  Resp 20  Oxygen Therapy  SpO2 100 %  O2 Device Nasal Cannula  Height and Weight  Height 5\' 4"  (1.626 m)  Weight 101.8 kg (224 lb 8 oz)  Type of Scale Used Standing (Scale A)  Type of Weight Actual  BSA (Calculated - sq m) 2.14 sq meters  BMI (Calculated) 38.6  Weight in (lb) to have BMI = 25 145.3  Admitted pt to rm 3E04 from ED, pt alert and oriented, denied pain at this time, oriented to room, call bell placed within reach, pt placed on cardiac monitor, CCMD made aware.

## 2017-03-26 NOTE — ED Provider Notes (Signed)
[facility]-EMERGENCY DEPT Provider Note   CSN: [id number] Arrival date & time: 03/26/17  1352     History   Chief Complaint Chief Complaint  Patient presents with  . Ascites    HPI Deborah Ho is a 66 y.o. female.  Patient is a 66 year old female with a history of coronary artery disease, hypertension, hyperlipidemia and CHF. She is currently on 2 Rose Smiley and spironolactone but has a history in the past of medication noncompliance. She recently had issues with lower leg edema and cellulitis. She states that it's actually gotten much better and her leg swelling has improved. However over the last 2 weeks she's had progressive swelling in her abdomen and increased shortness of breath. She denies any pain in her abdomen other than that feels real tight. She states she's having normal bowel movements. She recently had a GI bug with vomiting and diarrhea about 3 weeks ago but nothing recently. She states that she gets short of breath walking short distances. She denies any cough or known chest congestion. No known fevers. No nausea or vomiting. She states her baseline dry weight is about 180-185 although she hasn't been that weight in about 2 years. She states following her most recent discharge after diuresis she weighed about 200 pounds. She states today she was 220 at home. She does state that she's taking her medications as directed. She denies any change in her diet or increased salt. She states that she does eat ribs about once a week.      Past Medical History:  Diagnosis Date  . CAD (coronary artery disease) 05/03/14; 05/09/14   a. anterior STEMI with early in-stent thorombosis for missed dose of Brillinta s/p PCI with DESx 2 into LAD (04/2014)  . CHF (congestive heart failure) (HCC)   . GERD (gastroesophageal reflux disease)    Probable  . HTN (hypertension)   . Hyperlipidemia   . Ischemic cardiomyopathy    a. 04/2014 ECHO with EF 45-50% b.  Repeat 2D echo 08/14/14 with EF down at  15%. Life vest placed  . Non compliance w medication regimen   . Tobacco use     Patient Active Problem List   Diagnosis Date Noted  . Intractable nausea and vomiting 02/18/2017  . HTN (hypertension)   . Hyperlipidemia   . GERD (gastroesophageal reflux disease)   . CHF (congestive heart failure) (HCC)   . Cellulitis of left lower extremity   . Cough productive of clear sputum   . Cellulitis of left lower leg   . Hypokalemia 01/28/2017  . Peripheral edema   . Abdominal distension 06/26/2016  . Anasarca 06/26/2016  . Chronic systolic heart failure (HCC) 02/20/2016  . CKD (chronic kidney disease), stage III   . Adjustment disorder with mixed anxiety and depressed mood 02/28/2015  . Pleural effusion on right   . Essential hypertension   . Angioedema of lips 08/17/2014  . Ischemic cardiomyopathy 08/13/2014  . Coronary atherosclerosis of native coronary artery 05/10/2014  . STEMI (ST elevation myocardial infarction) (HCC) 05/09/2014  . HLD (hyperlipidemia) 05/06/2014  . Tobacco abuse 05/06/2014  . Cardiomyopathy, ischemic, EF 30-35 % Echo 05/04/14. 05/06/2014  . Elevated LFTs 05/06/2014  . ST elevation myocardial infarction (STEMI) of anterior wall (HCC) 05/03/2014    Past Surgical History:  Procedure Laterality Date  . CHOLECYSTECTOMY    . CORONARY ANGIOPLASTY WITH STENT PLACEMENT  05/03/14   STEMI- stent to LAD DES- Xience alpine  . CORONARY ANGIOPLASTY WITH STENT PLACEMENT  05/09/14  STEMI- overlapping stent to LAD, pt had missed a dose of Brilinta  . LEFT HEART CATHETERIZATION WITH CORONARY ANGIOGRAM N/A 05/03/2014   Procedure: LEFT HEART CATHETERIZATION WITH CORONARY ANGIOGRAM;  Surgeon: Peter M Swaziland, MD;  Location: [facility] CATH LAB;  Service: Cardiovascular;  Laterality: N/A;  . LEFT HEART CATHETERIZATION WITH CORONARY ANGIOGRAM N/A 05/09/2014   Procedure: LEFT HEART CATHETERIZATION WITH CORONARY ANGIOGRAM;  Surgeon: Corky Crafts, MD;  Location: [facility] CATH LAB;  Service:  Cardiovascular;  Laterality: N/A;  . PARTIAL HYSTERECTOMY    . PERCUTANEOUS STENT INTERVENTION  05/03/2014   Procedure: PERCUTANEOUS STENT INTERVENTION;  Surgeon: Peter M Swaziland, MD;  Location: [facility] CATH LAB;  Service: Cardiovascular;;  DES Prox LAD     OB History    No data available       Home Medications    Prior to Admission medications   Medication Sig Start Date End Date Taking? Authorizing Provider  atorvastatin (LIPITOR) 80 MG tablet Take 1 tablet (80 mg total) by mouth daily at 6 PM. 07/06/16  Yes Johnson, Clanford L, MD  carvedilol (COREG) 6.25 MG tablet Take 1 tablet (6.25 mg total) by mouth 2 (two) times daily with a meal. Patient taking differently: Take 6.25 mg by mouth daily.  02/06/17  Yes Zannie Cove, MD  pantoprazole (PROTONIX) 20 MG tablet Take 20 mg by mouth daily. 02/24/17  Yes [provider]  spironolactone (ALDACTONE) 25 MG tablet Take 1 tablet (25 mg total) by mouth daily. 02/06/17  Yes Zannie Cove, MD  cetirizine (ZYRTEC) 10 MG tablet Take 10 mg by mouth daily as needed for allergies.     [provider]  ferrous sulfate 325 (65 FE) MG tablet Take 325 mg by mouth 2 (two) times a week. Tuesdays and Thursdays    [provider]  HYDROcodone-acetaminophen (NORCO/VICODIN) 5-325 MG tablet Take 1-2 tablets by mouth every 4 (four) hours as needed for moderate pain or severe pain. Patient not taking: Reported on 03/26/2017 02/12/17   Arby Barrette, MD  nitroGLYCERIN (NITROSTAT) 0.4 MG SL tablet Place 0.4 mg under the tongue every 5 (five) minutes as needed for chest pain.    [provider]  ticagrelor (BRILINTA) 60 MG TABS tablet Take 1 tablet (60 mg total) by mouth 2 (two) times daily. 07/22/16   Laurey Morale, MD  torsemide (DEMADEX) 20 MG tablet Take 3 tablets (60 mg total) by mouth daily. Patient taking differently: Take 20 mg by mouth 3 (three) times daily.  02/06/17   Zannie Cove, MD    Family History Family History    Problem Relation Age of Onset  . Diabetes Mother   . Hypertension Mother     Social History Social History  Substance Use Topics  . Smoking status: Current Some Day Smoker    Packs/day: 0.10    Years: 0.50    Types: Cigarettes  . Smokeless tobacco: Current User     Comment: down to 3 cigarettes daily (08/03/14)  . Alcohol use No     Allergies   Aspirin; Effient [prasugrel]; Entresto [sacubitril-valsartan]; Lactose intolerance (gi); Robitussin dm [guaifenesin-dm]; Sulfa antibiotics; and Wheat bran   Review of Systems Review of Systems  Constitutional: Positive for fatigue. Negative for chills, diaphoresis and fever.  HENT: Negative for congestion, rhinorrhea and sneezing.   Eyes: Negative.   Respiratory: Positive for shortness of breath. Negative for cough and chest tightness.   Cardiovascular: Negative for chest pain and leg swelling.  Gastrointestinal: Positive for abdominal distention. Negative  for abdominal pain, blood in stool, diarrhea, nausea and vomiting.  Genitourinary: Negative for difficulty urinating, flank pain, frequency and hematuria.  Musculoskeletal: Negative for arthralgias and back pain.  Skin: Negative for rash.  Neurological: Negative for dizziness, speech difficulty, weakness, numbness and headaches.     Physical Exam Updated Vital Signs BP (!) 152/119   Pulse 99   Temp 97.5 F (36.4 C) (Oral)   Resp (!) 23   SpO2 94%   Physical Exam  Constitutional: She is oriented to person, place, and time. She appears well-developed and well-nourished.  HENT:  Head: Normocephalic and atraumatic.  Eyes: Pupils are equal, round, and reactive to light.  Neck: Normal range of motion. Neck supple.  Cardiovascular: Normal rate, regular rhythm and normal heart sounds.   Pulmonary/Chest: Effort normal and breath sounds normal. No respiratory distress. She has no wheezes. She has no rales. She exhibits no tenderness.  Abdominal: Soft. Bowel sounds are normal. She  exhibits distension. There is no tenderness. There is no rebound and no guarding.  Musculoskeletal: Normal range of motion. She exhibits edema (2+ pain edema bilaterally).  Lymphadenopathy:    She has no cervical adenopathy.  Neurological: She is alert and oriented to person, place, and time.  Skin: Skin is warm and dry. No rash noted.  Psychiatric: She has a normal mood and affect.     ED Treatments / Results  Labs (all labs ordered are listed, but only abnormal results are displayed) Labs Reviewed  BASIC METABOLIC PANEL - Abnormal; Notable for the following:       Result Value   Creatinine, Ser 1.25 (*)    GFR calc non Af Amer 44 (*)    GFR calc Af Amer 51 (*)    All other components within normal limits  CBC - Abnormal; Notable for the following:    RBC 5.41 (*)    MCH 25.9 (*)    RDW 17.0 (*)    All other components within normal limits  BRAIN NATRIURETIC PEPTIDE - Abnormal; Notable for the following:    B Natriuretic Peptide 1,679.1 (*)    All other components within normal limits  HEPATIC FUNCTION PANEL - Abnormal; Notable for the following:    Albumin 3.4 (*)    Alkaline Phosphatase 199 (*)    Total Bilirubin 2.0 (*)    Bilirubin, Direct 0.8 (*)    Indirect Bilirubin 1.2 (*)    All other components within normal limits  I-STAT TROPOININ, ED    EKG  EKG Interpretation  Date/Time:  Friday Mar 26 2017 13:57:41 EDT Ventricular Rate:  88 PR Interval:  164 QRS Duration: 74 QT Interval:  398 QTC Calculation: 481 R Axis:   139 Text Interpretation:  Normal sinus rhythm Low voltage QRS Left posterior fascicular block Cannot rule out Anteroseptal infarct , age undetermined Abnormal ECG since last tracing no significant change Confirmed by Lavonda Thal  MD, Jasmen Emrich ([id number]) on 03/26/2017 3:02:07 PM       Radiology Dg Chest 2 View  Result Date: 03/26/2017 CLINICAL DATA:  Ascites. EXAM: CHEST  2 VIEW COMPARISON:  Radiograph of February 18, 2017. FINDINGS: Stable cardiomegaly is  noted. No pneumothorax is noted. Left lung is clear. Mild right pleural effusion is noted. Mild right basilar subsegmental atelectasis or infiltrate is noted. Bony thorax is unremarkable. IMPRESSION: Mild right basilar subsegmental atelectasis or infiltrate is noted with associated mild right pleural effusion. Electronically Signed   By: Lupita Raider, M.D.   On: 03/26/2017 14:39  Dg Abd Acute W/chest  Result Date: 03/26/2017 CLINICAL DATA:  Abdominal distension. EXAM: DG ABDOMEN ACUTE W/ 1V CHEST COMPARISON:  Radiographs of June 26, 2016. FINDINGS: There is no evidence of dilated bowel loops or free intraperitoneal air. No radiopaque calculi or other significant radiographic abnormality is seen. Mild cardiomegaly is noted. Mild right basilar atelectasis or infiltrate is noted with associated pleural effusion. IMPRESSION: No evidence of bowel obstruction or ileus. Mild right basilar atelectasis or infiltrate is noted with associated pleural effusion. Electronically Signed   By: Lupita Raider, M.D.   On: 03/26/2017 17:03    Procedures Procedures (including critical care time)  Medications Ordered in ED Medications  furosemide (LASIX) injection 80 mg (80 mg Intravenous Given 03/26/17 1628)     Initial Impression / Assessment and Plan / ED Course  I have reviewed the triage vital signs and the nursing notes.  Pertinent labs & imaging results that were available during my care of the patient were reviewed by me and considered in my medical decision making (see chart for details).     Patient presents with symptoms consistent with her prior episodes of CHF. Her leg swelling has improved but she has anasarca and a right pleural effusion. There is no suggestions of pneumonia. She has started to diurese in the ED with Lasix. She has no ischemic changes on EKG. No other clinical findings that would be more suggestive of SBP.  I spoke with the hospitalist who will admit the pt.  Final Clinical  Impressions(s) / ED Diagnoses   Final diagnoses:  Acute systolic congestive heart failure [facility])    New Prescriptions New Prescriptions   No medications on file     Rolan Bucco, MD 03/26/17 1742

## 2017-03-26 NOTE — H&P (Signed)
History and Physical    Deborah Ho ZOX:[id number] DOB: 1951-05-08 DOA: 03/26/2017  PCP: Grayce Sessions, NP   Patient coming from: Home   I have personally briefly reviewed patient's old medical records in [facility]  Chief Complaint: Abdominal swelling   HPI: Deborah Ho is a 66 y.o. female with medical history significant of coronary artery disease, hypertension, dyslipidemia, GERD, combined systolic and diastolic heart failure with last echo in 3/18 showing ejection fraction of 20-25%, recent hospitalization in April 2018 for CHF exacerbation, lower extremity cellulitis and ulcer present to the emergency room with complains of worsening abdominal swelling for the last 2 weeks. She also complains of lower extremity swelling and shortness of breath. She denies any chest pain palpitations loss of consciousness, diarrhea or dysuria. She has gained more than 15 pounds over the last 2 weeks. She is compliant with her diuretic medications as per the patient. She denies any change in her diet or increased salt. Her dose of torsemide was decreased to 40 mg a day as an outpatient 2 weeks ago. She denies any abdominal pain, fever, nausea, vomiting.  ED Course: She was given intravenous Lasix and hospitalist service was called to evaluate and admit the patient.   Review of Systems: As per HPI otherwise 10 point review of systems negative.    Past Medical History:  Diagnosis Date  . CAD (coronary artery disease) 05/03/14; 05/09/14   a. anterior STEMI with early in-stent thorombosis for missed dose of Brillinta s/p PCI with DESx 2 into LAD (04/2014)  . CHF (congestive heart failure) (HCC)   . GERD (gastroesophageal reflux disease)    Probable  . HTN (hypertension)   . Hyperlipidemia   . Ischemic cardiomyopathy    a. 04/2014 ECHO with EF 45-50% b.  Repeat 2D echo 08/14/14 with EF down at 15%. Life vest placed  . Non compliance w medication regimen   . Tobacco use     Past Surgical  History:  Procedure Laterality Date  . CHOLECYSTECTOMY    . CORONARY ANGIOPLASTY WITH STENT PLACEMENT  05/03/14   STEMI- stent to LAD DES- Xience alpine  . CORONARY ANGIOPLASTY WITH STENT PLACEMENT  05/09/14   STEMI- overlapping stent to LAD, pt had missed a dose of Brilinta  . LEFT HEART CATHETERIZATION WITH CORONARY ANGIOGRAM N/A 05/03/2014   Procedure: LEFT HEART CATHETERIZATION WITH CORONARY ANGIOGRAM;  Surgeon: Peter M Swaziland, MD;  Location: [facility] CATH LAB;  Service: Cardiovascular;  Laterality: N/A;  . LEFT HEART CATHETERIZATION WITH CORONARY ANGIOGRAM N/A 05/09/2014   Procedure: LEFT HEART CATHETERIZATION WITH CORONARY ANGIOGRAM;  Surgeon: Corky Crafts, MD;  Location: [facility] CATH LAB;  Service: Cardiovascular;  Laterality: N/A;  . PARTIAL HYSTERECTOMY    . PERCUTANEOUS STENT INTERVENTION  05/03/2014   Procedure: PERCUTANEOUS STENT INTERVENTION;  Surgeon: Peter M Swaziland, MD;  Location: [facility] CATH LAB;  Service: Cardiovascular;;  DES Prox LAD    Social history  reports that she has been smoking Cigarettes.  She has a 0.05 pack-year smoking history. She uses smokeless tobacco. She reports that she does not drink alcohol or use drugs.  Allergies  Allergen Reactions  . Aspirin Swelling    Chewable children's aspirin makes patients tongue and face swell  . Effient [Prasugrel] Swelling    Patient's tongue and face swells  . Entresto [Sacubitril-Valsartan] Other (See Comments)    dizziness  . Lactose Intolerance (Gi) Other (See Comments)    REACTION: stomach upset  . Robitussin Dm [Guaifenesin-Dm] Swelling  Patient's tongue swells  . Sulfa Antibiotics Swelling  . Wheat Bran Other (See Comments)    REACTION: unknown    Family History  Problem Relation Age of Onset  . Diabetes Mother   . Hypertension Mother     Prior to Admission medications   Medication Sig Start Date End Date Taking? Authorizing Provider  atorvastatin (LIPITOR) 80 MG tablet Take 1 tablet (80 mg total) by mouth  daily at 6 PM. 07/06/16  Yes Johnson, Clanford L, MD  carvedilol (COREG) 6.25 MG tablet Take 1 tablet (6.25 mg total) by mouth 2 (two) times daily with a meal. Patient taking differently: Take 6.25 mg by mouth daily.  02/06/17  Yes Zannie Cove, MD  pantoprazole (PROTONIX) 20 MG tablet Take 20 mg by mouth daily. 02/24/17  Yes [provider]  spironolactone (ALDACTONE) 25 MG tablet Take 1 tablet (25 mg total) by mouth daily. 02/06/17  Yes Zannie Cove, MD  cetirizine (ZYRTEC) 10 MG tablet Take 10 mg by mouth daily as needed for allergies.     [provider]  ferrous sulfate 325 (65 FE) MG tablet Take 325 mg by mouth 2 (two) times a week. Tuesdays and Thursdays    [provider]  HYDROcodone-acetaminophen (NORCO/VICODIN) 5-325 MG tablet Take 1-2 tablets by mouth every 4 (four) hours as needed for moderate pain or severe pain. Patient not taking: Reported on 03/26/2017 02/12/17   Arby Barrette, MD  nitroGLYCERIN (NITROSTAT) 0.4 MG SL tablet Place 0.4 mg under the tongue every 5 (five) minutes as needed for chest pain.    [provider]  ticagrelor (BRILINTA) 60 MG TABS tablet Take 1 tablet (60 mg total) by mouth 2 (two) times daily. 07/22/16   Laurey Morale, MD  torsemide (DEMADEX) 20 MG tablet Take 3 tablets (60 mg total) by mouth daily. Patient taking differently: Take 20 mg by mouth 3 (three) times daily.  02/06/17   Zannie Cove, MD    Physical Exam: Vitals:   03/26/17 1356 03/26/17 1630 03/26/17 1645 03/26/17 1700  BP: (!) 147/110 (!) 157/106 (!) 167/107 (!) 152/119  Pulse: 88 85 88 99  Resp: 18 (!) 24 (!) 31 (!) 23  Temp: 97.5 F (36.4 C)     TempSrc: Oral     SpO2: 97% 100% 98% 94%    Constitutional: Sitting on bed, no acute distress. Alert and awake.  Vitals:   03/26/17 1356 03/26/17 1630 03/26/17 1645 03/26/17 1700  BP: (!) 147/110 (!) 157/106 (!) 167/107 (!) 152/119  Pulse: 88 85 88 99  Resp: 18 (!) 24 (!) 31 (!) 23  Temp: 97.5 F  (36.4 C)     TempSrc: Oral     SpO2: 97% 100% 98% 94%   Eyes: PERRL, lids and conjunctivae normal ENMT: Mucous membranes are moist. Posterior pharynx clear of any exudate or lesions.Normal dentition.  Neck: normal, supple, no masses, no thyromegaly Respiratory:Bilateral decreased breath sounds bases with basilar crackles Cardiovascular: S1-S2 positive, rate controlled. No murmurs  Abdomen:Soft, distended, nontender, bowel sounds positive, no organomegaly.  Musculoskeletal: no clubbing / cyanosis. No joint deformity upper and lower extremities. Good ROM, no contractures. Normal muscle tone.  Skin: Dressing in the shin area  Neurologic: Alert awake and oriented. No focal neurologic deficits. Ordering stool studies Psychiatric : Normal judgment and insight. Alert and oriented x 3. Normal mood.    Labs on Admission: I have personally reviewed following labs and imaging studies  CBC:  Recent Labs Lab 03/26/17 1405  WBC  8.5  HGB 14.0  HCT 46.0  MCV 85.0  PLT 331   Basic Metabolic Panel:  Recent Labs Lab 03/26/17 1405  NA 142  K 4.3  CL 111  CO2 22  GLUCOSE 96  BUN 17  CREATININE 1.25*  CALCIUM 9.0   GFR: CrCl cannot be calculated (Unknown ideal weight.). Liver Function Tests:  Recent Labs Lab 03/26/17 1619  AST 25  ALT 14  ALKPHOS 199*  BILITOT 2.0*  PROT 6.9  ALBUMIN 3.4*   No results for input(s): LIPASE, AMYLASE in the last 168 hours. No results for input(s): AMMONIA in the last 168 hours. Coagulation Profile: No results for input(s): INR, PROTIME in the last 168 hours. Cardiac Enzymes: No results for input(s): CKTOTAL, CKMB, CKMBINDEX, TROPONINI in the last 168 hours. BNP (last 3 results) No results for input(s): PROBNP in the last 8760 hours. HbA1C: No results for input(s): HGBA1C in the last 72 hours. CBG: No results for input(s): GLUCAP in the last 168 hours. Lipid Profile: No results for input(s): CHOL, HDL, LDLCALC, TRIG, CHOLHDL, LDLDIRECT in  the last 72 hours. Thyroid Function Tests: No results for input(s): TSH, T4TOTAL, FREET4, T3FREE, THYROIDAB in the last 72 hours. Anemia Panel: No results for input(s): VITAMINB12, FOLATE, FERRITIN, TIBC, IRON, RETICCTPCT in the last 72 hours. Urine analysis:    Component Value Date/Time   COLORURINE YELLOW 01/28/2017 1850   APPEARANCEUR CLEAR 01/28/2017 1850   LABSPEC 1.011 01/28/2017 1850   PHURINE 5.0 01/28/2017 1850   GLUCOSEU NEGATIVE 01/28/2017 1850   HGBUR SMALL (A) 01/28/2017 1850   BILIRUBINUR NEGATIVE 01/28/2017 1850   KETONESUR NEGATIVE 01/28/2017 1850   PROTEINUR 30 (A) 01/28/2017 1850   UROBILINOGEN 1.0 07/25/2015 1601   NITRITE NEGATIVE 01/28/2017 1850   LEUKOCYTESUR NEGATIVE 01/28/2017 1850    Radiological Exams on Admission: Dg Chest 2 View  Result Date: 03/26/2017 CLINICAL DATA:  Ascites. EXAM: CHEST  2 VIEW COMPARISON:  Radiograph of February 18, 2017. FINDINGS: Stable cardiomegaly is noted. No pneumothorax is noted. Left lung is clear. Mild right pleural effusion is noted. Mild right basilar subsegmental atelectasis or infiltrate is noted. Bony thorax is unremarkable. IMPRESSION: Mild right basilar subsegmental atelectasis or infiltrate is noted with associated mild right pleural effusion. Electronically Signed   By: Lupita Raider, M.D.   On: 03/26/2017 14:39   Dg Abd Acute W/chest  Result Date: 03/26/2017 CLINICAL DATA:  Abdominal distension. EXAM: DG ABDOMEN ACUTE W/ 1V CHEST COMPARISON:  Radiographs of June 26, 2016. FINDINGS: There is no evidence of dilated bowel loops or free intraperitoneal air. No radiopaque calculi or other significant radiographic abnormality is seen. Mild cardiomegaly is noted. Mild right basilar atelectasis or infiltrate is noted with associated pleural effusion. IMPRESSION: No evidence of bowel obstruction or ileus. Mild right basilar atelectasis or infiltrate is noted with associated pleural effusion. Electronically Signed   By: Lupita Raider, M.D.   On: 03/26/2017 17:03      Assessment/Plan Principal Problem:   Acute exacerbation of CHF (congestive heart failure) (HCC) Active Problems:   Essential hypertension   CKD (chronic kidney disease), stage III   Abdominal distension  1. Acute on chronic systolic heart failure: - Admit to telemetry  - Lasix 80 mg IV twice a day. Strict input and output, daily weights, monitor creatinine  - Cardiology evaluation if necessary  - Continue spironolactone and Coreg  - Echo in 3/18 had shown ejection fraction of 20-25%, moderately dilated RV with moderately decreased systolic function  -  Hold torsemide for now   2. Abdominal distention: Rule out ascites from heart failure.  -Ultrasound of abdomen. Paracentesis if needed  3. History of coronary artery disease: Continue with Coreg, Brilinta and Lasix. Cardiology evaluation if needed.  4. Hypertension: Monitor blood pressure, continue with Coreg, Lasix and spironolactone  5. Chronic kidney disease stage III: Creatinine at baseline, monitor creatinine with the use of diuretics  6. Elevated bilirubin and alkaline phosphatase: Follow-up ultrasound of abdomen and repeat LFTs tomorrow  DVT prophylaxis: Lovenox  Code Status: Full  Family Communication:  None At bedside  Disposition Plan: Home in 2-3 days  Consults called:None  Admission status: Inpatient/telemetry  Glade Lloyd MD [facility] Pager [phone]  If 7PM-7AM, please contact night-coverage [email or web address] Password [email or web address]  03/26/2017, 6:00 PM

## 2017-03-27 ENCOUNTER — Inpatient Hospital Stay (HOSPITAL_COMMUNITY): Payer: Medicare Other

## 2017-03-27 LAB — CBC
HCT: 44 % (ref 36.0–46.0)
Hemoglobin: 13.4 g/dL (ref 12.0–15.0)
MCH: 25.9 pg — ABNORMAL LOW (ref 26.0–34.0)
MCHC: 30.5 g/dL (ref 30.0–36.0)
MCV: 84.9 fL (ref 78.0–100.0)
Platelets: 313 10*3/uL (ref 150–400)
RBC: 5.18 MIL/uL — ABNORMAL HIGH (ref 3.87–5.11)
RDW: 17.4 % — ABNORMAL HIGH (ref 11.5–15.5)
WBC: 8 10*3/uL (ref 4.0–10.5)

## 2017-03-27 LAB — COMPREHENSIVE METABOLIC PANEL
ALT: 12 U/L — ABNORMAL LOW (ref 14–54)
AST: 22 U/L (ref 15–41)
Albumin: 3.1 g/dL — ABNORMAL LOW (ref 3.5–5.0)
Alkaline Phosphatase: 178 U/L — ABNORMAL HIGH (ref 38–126)
Anion gap: 12 (ref 5–15)
BUN: 17 mg/dL (ref 6–20)
CO2: 23 mmol/L (ref 22–32)
Calcium: 9.1 mg/dL (ref 8.9–10.3)
Chloride: 107 mmol/L (ref 101–111)
Creatinine, Ser: 1.33 mg/dL — ABNORMAL HIGH (ref 0.44–1.00)
GFR calc Af Amer: 47 mL/min — ABNORMAL LOW (ref >60)
GFR calc non Af Amer: 41 mL/min — ABNORMAL LOW (ref >60)
Glucose, Bld: 85 mg/dL (ref 65–99)
Potassium: 3.4 mmol/L — ABNORMAL LOW (ref 3.5–5.1)
Sodium: 142 mmol/L (ref 135–145)
Total Bilirubin: 2.3 mg/dL — ABNORMAL HIGH (ref 0.3–1.2)
Total Protein: 6.3 g/dL — ABNORMAL LOW (ref 6.5–8.1)

## 2017-03-27 NOTE — Progress Notes (Signed)
Patient ID: Deborah Ho, female   DOB: 11/25/1950, 66 y.o.   MRN: [id number]  PROGRESS NOTE    Deborah Ho  EXH:[id number] DOB: 03/23/1951 DOA: 03/26/2017 PCP: Grayce Sessions, NP   Brief Narrative:  66 y.o. female with medical history significant of coronary artery disease, hypertension, dyslipidemia, GERD, combined systolic and diastolic heart failure with last echo in 3/18 showing ejection fraction of 20-25%, recent hospitalization in April 2018 for CHF exacerbation, lower extremity cellulitis and ulcer present to the emergency room with complains of worsening abdominal swelling for the last 2 weeks and was admitted for acute and chronic CHF. She was started on intravenous Lasix. She is feeling a little better.   Assessment & Plan:   Principal Problem:   Acute exacerbation of CHF (congestive heart failure) (HCC) Active Problems:   Essential hypertension   CKD (chronic kidney disease), stage III   Abdominal distension   1. Acute on chronic systolic heart failure: - Continue Lasix 80 mg IV twice a day. Strict input and output, daily weights, monitor creatinine. Negative balance of  1600 mL since admission - Cardiology evaluation if necessary  - Continue spironolactone and Coreg  - Echo in 3/18 had shown ejection fraction of 20-25%, moderately dilated RV with moderately decreased systolic function  - Hold torsemide for now   2. Abdominal distention: Probably from heart failure. Ultrasound of abdomen did not show any ascites  3. History of coronary artery disease: Continue with Coreg, Brilinta and Lasix. Cardiology evaluation if needed.  4. Hypertension: Monitor blood pressure, continue with Coreg, Lasix and spironolactone  5. Chronic kidney disease stage III: Creatinine at baseline, monitor creatinine with the use of diuretics  6. Elevated bilirubin and alkaline phosphatase: Still slightly elevated. Ultrasound of abdomen did not show any abnormalities in the liver.  Probable outpatient GI evaluation if needed  DVT prophylaxis: Lovenox Code Status:  Full Family Communication: No family at bedside Disposition Plan: Home in 2-3 days  Consultants: None  Procedures: None  Antimicrobials: None   Subjective: Patient seen and examined at bedside. She feels much better. she thinks her abdominal swelling is better.  Objective: Vitals:   03/27/17 0024 03/27/17 0434 03/27/17 0755 03/27/17 1240  BP: (!) 149/88 (!) 141/90 130/90 116/71  Pulse: 91 86 87 76  Resp: 20 20  20   Temp: 97.4 F (36.3 C) 97.8 F (36.6 C)  97.9 F (36.6 C)  TempSrc: Oral Oral  Oral  SpO2: 100% 100%  97%  Weight:  100.9 kg (222 lb 8 oz)    Height:        Intake/Output Summary (Last 24 hours) at 03/27/17 1343 Last data filed at 03/27/17 1025  Gross per 24 hour  Intake              240 ml  Output             1850 ml  Net            -1610 ml   Filed Weights   03/26/17 2100 03/27/17 0434  Weight: 101.8 kg (224 lb 8 oz) 100.9 kg (222 lb 8 oz)    Examination:  General exam: Appears calm and comfortable. Alert and awake Respiratory system: Bilateral decreased breath sound at bases with basilar crackles Cardiovascular system: S1-S2 positive, regular rate and rhythm  Gastrointestinal system: Soft, distended, bowel sounds positive, no organomegaly  Extremities: 1-2+ edema. No cyanosis clubbing    Data Reviewed: I have personally reviewed following labs and imaging studies  CBC:  Recent Labs Lab 03/26/17 1405 03/27/17 0307  WBC 8.5 8.0  HGB 14.0 13.4  HCT 46.0 44.0  MCV 85.0 84.9  PLT 331 313   Basic Metabolic Panel:  Recent Labs Lab 03/26/17 1405 03/27/17 0307  NA 142 142  K 4.3 3.4*  CL 111 107  CO2 22 23  GLUCOSE 96 85  BUN 17 17  CREATININE 1.25* 1.33*  CALCIUM 9.0 9.1   GFR: Estimated Creatinine Clearance: 48.7 mL/min (A) (by C-G formula based on SCr of 1.33 mg/dL (H)). Liver Function Tests:  Recent Labs Lab 03/26/17 1619 03/27/17 0307    AST 25 22  ALT 14 12*  ALKPHOS 199* 178*  BILITOT 2.0* 2.3*  PROT 6.9 6.3*  ALBUMIN 3.4* 3.1*   No results for input(s): LIPASE, AMYLASE in the last 168 hours. No results for input(s): AMMONIA in the last 168 hours. Coagulation Profile: No results for input(s): INR, PROTIME in the last 168 hours. Cardiac Enzymes: No results for input(s): CKTOTAL, CKMB, CKMBINDEX, TROPONINI in the last 168 hours. BNP (last 3 results) No results for input(s): PROBNP in the last 8760 hours. HbA1C: No results for input(s): HGBA1C in the last 72 hours. CBG: No results for input(s): GLUCAP in the last 168 hours. Lipid Profile: No results for input(s): CHOL, HDL, LDLCALC, TRIG, CHOLHDL, LDLDIRECT in the last 72 hours. Thyroid Function Tests: No results for input(s): TSH, T4TOTAL, FREET4, T3FREE, THYROIDAB in the last 72 hours. Anemia Panel: No results for input(s): VITAMINB12, FOLATE, FERRITIN, TIBC, IRON, RETICCTPCT in the last 72 hours. Sepsis Labs: No results for input(s): PROCALCITON, LATICACIDVEN in the last 168 hours.  No results found for this or any previous visit (from the past 240 hour(s)).       Radiology Studies: Dg Chest 2 View  Result Date: 03/26/2017 CLINICAL DATA:  Ascites. EXAM: CHEST  2 VIEW COMPARISON:  Radiograph of February 18, 2017. FINDINGS: Stable cardiomegaly is noted. No pneumothorax is noted. Left lung is clear. Mild right pleural effusion is noted. Mild right basilar subsegmental atelectasis or infiltrate is noted. Bony thorax is unremarkable. IMPRESSION: Mild right basilar subsegmental atelectasis or infiltrate is noted with associated mild right pleural effusion. Electronically Signed   By: Lupita Raider, M.D.   On: 03/26/2017 14:39   US Abdomen Complete  Result Date: 03/27/2017 CLINICAL DATA:  Abdominal distension. EXAM: ABDOMEN ULTRASOUND COMPLETE COMPARISON:  None. FINDINGS: Gallbladder: The gallbladder has been surgically removed. Common bile duct: Diameter: 3 mm  Liver: No focal lesion identified. Within normal limits in parenchymal echogenicity. IVC: No abnormality visualized. Pancreas: Poorly visualized. Visualized portions of the pancreatic head are normal. Spleen: Size and appearance within normal limits. Right Kidney: Length: 10 cm. Echogenicity within normal limits. No mass or hydronephrosis visualized. Left Kidney: Length: 10 cm. Echogenicity within normal limits. No mass or hydronephrosis visualized. Abdominal aorta: No aneurysm visualized. Other findings: None. IMPRESSION: Difficult study due to patient body habitus and shadowing bowel gas. No acute abnormalities are identified. Electronically Signed   By: Gerome Sam III M.D   On: 03/27/2017 07:33   Dg Abd Acute W/chest  Result Date: 03/26/2017 CLINICAL DATA:  Abdominal distension. EXAM: DG ABDOMEN ACUTE W/ 1V CHEST COMPARISON:  Radiographs of June 26, 2016. FINDINGS: There is no evidence of dilated bowel loops or free intraperitoneal air. No radiopaque calculi or other significant radiographic abnormality is seen. Mild cardiomegaly is noted. Mild right basilar atelectasis or infiltrate is noted with associated pleural effusion. IMPRESSION: No evidence of  bowel obstruction or ileus. Mild right basilar atelectasis or infiltrate is noted with associated pleural effusion. Electronically Signed   By: Lupita Raider, M.D.   On: 03/26/2017 17:03        Scheduled Meds: . atorvastatin  80 mg Oral q1800  . carvedilol  6.25 mg Oral BID WC  . enoxaparin (LOVENOX) injection  40 mg Subcutaneous Q24H  . [START ON 03/29/2017] ferrous sulfate  325 mg Oral Once per day on Mon Thu  . furosemide  80 mg Intravenous BID  . loratadine  10 mg Oral Daily  . pantoprazole  20 mg Oral Daily  . sodium chloride flush  3 mL Intravenous Q12H  . sodium chloride flush  3 mL Intravenous Q12H  . spironolactone  25 mg Oral Daily  . ticagrelor  60 mg Oral BID   Continuous Infusions: . sodium chloride       LOS: 1 day         Glade Lloyd, MD [facility] Pager [phone]  If 7PM-7AM, please contact night-coverage [email or web address] Password [facility] 03/27/2017, 1:43 PM

## 2017-03-27 NOTE — Evaluation (Signed)
Physical Therapy Evaluation Patient Details Name: Deborah Ho MRN: [id number] DOB: 02/02/1951 Today's Date: 03/27/2017   History of Present Illness  Pt is a 66 yo female admitted through the ED on 03/26/17 for CHF exacerbation with abdominal distension, weight gain of 15lbs in 2 weeks, and dyspnea. Pt had recent admission 4/18 for CHF with cellulits of LLE with an ulcer.  PMH significant for CAD, HTN, HLD, GERD, CHF with EF 20-25%.   Clinical Impression  Pt presents with the above diagnosis for therapy evaluation. Prior to admission and recent exacerbation, pt was able to maneuver around apartment without assistance or an AD. Pt is able to perform mobility this session with Mod I or Independent this session but requires increased time to complete tasks. No increased dyspnea noted with exertion this session with O2 sats between 97-98% this session. Pt is functioning at baseline or near baseline during this assessment. Depending on length of stay, pt will benefit from ambulation with mobility tech in order to improve her endurance before returning home. Discussed with pt that using a pedometer will assist with measuring her steps at home in order to gauge her progress. All education completed and therapy will sign off for now. Please re-order therapy if any changed arise.     Follow Up Recommendations No PT follow up    Equipment Recommendations  None recommended by PT    Recommendations for Other Services       Precautions / Restrictions Precautions Precautions: None Restrictions Weight Bearing Restrictions: No      Mobility  Bed Mobility Overal bed mobility: Independent             General bed mobility comments: sitting in chair when PT arrives  Transfers Overall transfer level: Modified independent               General transfer comment: sit to stand from commode and arm chair with use of UE's no physical assistance or supervision  needed  Ambulation/Gait Ambulation/Gait assistance: Supervision;Modified independent (Device/Increase time) Ambulation Distance (Feet): 150 Feet Assistive device: None Gait Pattern/deviations: Step-through pattern Gait velocity: decreased Gait velocity interpretation: Below normal speed for age/gender (Pt reports speed was higher than her norm) General Gait Details: steady gait with step through sequencing. pt does grab onto railing occasionally with fatigue, but no hands on assistance required by clincian  Stairs            Wheelchair Mobility    Modified Rankin (Stroke Patients Only)       Balance Overall balance assessment: No apparent balance deficits (not formally assessed)                                           Pertinent Vitals/Pain Pain Assessment: No/denies pain    Home Living Family/patient expects to be discharged to:: Private residence Living Arrangements: Alone Available Help at Discharge: Family;Friend(s);Available PRN/intermittently Type of Home: Apartment Home Access: Elevator     Home Layout: One level Home Equipment: Grab bars - toilet;Grab bars - tub/shower Additional Comments: lives in [place]    Prior Function Level of Independence: Independent         Comments: completely independent prior to admission     Hand Dominance   Dominant Hand: Right    Extremity/Trunk Assessment   Upper Extremity Assessment Upper Extremity Assessment: Defer to OT evaluation    Lower Extremity Assessment Lower  Extremity Assessment: Overall WFL for tasks assessed    Cervical / Trunk Assessment Cervical / Trunk Assessment: Normal  Communication   Communication: No difficulties  Cognition Arousal/Alertness: Awake/alert Behavior During Therapy: WFL for tasks assessed/performed Overall Cognitive Status: Within Functional Limits for tasks assessed                                        General Comments       Exercises     Assessment/Plan    PT Assessment Patent does not need any further PT services  PT Problem List         PT Treatment Interventions      PT Goals (Current goals can be found in the Care Plan section)  Acute Rehab PT Goals Patient Stated Goal: to get back to normal    Frequency     Barriers to discharge        Co-evaluation               AM-PAC PT "6 Clicks" Daily Activity  Outcome Measure Difficulty turning over in bed (including adjusting bedclothes, sheets and blankets)?: None Difficulty moving from lying on back to sitting on the side of the bed? : None Difficulty sitting down on and standing up from a chair with arms (e.g., wheelchair, bedside commode, etc,.)?: None Help needed moving to and from a bed to chair (including a wheelchair)?: None Help needed walking in hospital room?: None Help needed climbing 3-5 steps with a railing? : A Little 6 Click Score: 23    End of Session Equipment Utilized During Treatment: Gait belt Activity Tolerance: Patient tolerated treatment well Patient left: in chair;with call bell/phone within reach Nurse Communication: Mobility status PT Visit Diagnosis: Muscle weakness (generalized) (M62.81)    Time: 0263-7858 PT Time Calculation (min) (ACUTE ONLY): 27 min   Charges:   PT Evaluation $PT Eval Moderate Complexity: 1 Procedure PT Treatments $Gait Training: 8-22 mins   PT G Codes:        Colin Broach PT, DPT  [phone]   Ruel Favors Aletha Halim 03/27/2017, 12:25 PM

## 2017-03-28 LAB — BASIC METABOLIC PANEL
Anion gap: 11 (ref 5–15)
BUN: 19 mg/dL (ref 6–20)
CO2: 26 mmol/L (ref 22–32)
Calcium: 9 mg/dL (ref 8.9–10.3)
Chloride: 103 mmol/L (ref 101–111)
Creatinine, Ser: 1.36 mg/dL — ABNORMAL HIGH (ref 0.44–1.00)
GFR calc Af Amer: 46 mL/min — ABNORMAL LOW (ref >60)
GFR calc non Af Amer: 40 mL/min — ABNORMAL LOW (ref >60)
Glucose, Bld: 105 mg/dL — ABNORMAL HIGH (ref 65–99)
Potassium: 3.4 mmol/L — ABNORMAL LOW (ref 3.5–5.1)
Sodium: 140 mmol/L (ref 135–145)

## 2017-03-28 LAB — MAGNESIUM: Magnesium: 1.9 mg/dL (ref 1.7–2.4)

## 2017-03-28 MED ORDER — POTASSIUM CHLORIDE CRYS ER 20 MEQ PO TBCR
40.0000 meq | EXTENDED_RELEASE_TABLET | Freq: Once | ORAL | Status: AC
  Administered 2017-03-28: 40 meq via ORAL
  Filled 2017-03-28: qty 2

## 2017-03-28 MED ORDER — FUROSEMIDE 10 MG/ML IJ SOLN
80.0000 mg | Freq: Three times a day (TID) | INTRAMUSCULAR | Status: DC
  Administered 2017-03-28 – 2017-04-02 (×15): 80 mg via INTRAVENOUS
  Filled 2017-03-28 (×15): qty 8

## 2017-03-28 NOTE — Progress Notes (Addendum)
Pt is alert and oriented  RA sitting in chair with no complains, vitals  Stable. Pt see wound Care and Advance Nurse for healing wound

## 2017-03-28 NOTE — Evaluation (Signed)
Occupational Therapy Evaluation and Discharge Patient Details Name: Deborah Ho MRN: [id number] DOB: 1951-08-18 Today's Date: 03/28/2017    History of Present Illness Pt is a 66 yo female admitted through the ED on 03/26/17 for CHF exacerbation with abdominal distension, weight gain of 15lbs in 2 weeks, and dyspnea. Pt had recent admission 4/18 for CHF with cellulits of LLE with an ulcer.  PMH significant for CAD, HTN, HLD, GERD, CHF with EF 20-25%.    Clinical Impression   Pt reports she was independent with ADL PTA. Currently pt overall mod I for ADL and functional mobility. Educated pt on energy conservation strategies and home safety. Pt planning to d/c home alone with intermittent supervision from family/friends. No further acute OT needs identified; signing off at this time. Please re-consult if needs change. Thank you for this referral.    Follow Up Recommendations  No OT follow up;Supervision - Intermittent    Equipment Recommendations  None recommended by OT    Recommendations for Other Services       Precautions / Restrictions Precautions Precautions: None Restrictions Weight Bearing Restrictions: No      Mobility Bed Mobility               General bed mobility comments: Pt OOB in chair upon arrival  Transfers Overall transfer level: Modified independent Equipment used: None                  Balance Overall balance assessment: No apparent balance deficits (not formally assessed)                                         ADL either performed or assessed with clinical judgement   ADL Overall ADL's : Modified independent                                       General ADL Comments: Pt able to complete ADL and higher level balance activites without LOB or unsteadiness. Educated pt on energy conservation strategies.     Vision         Perception     Praxis      Pertinent Vitals/Pain Pain Assessment: No/denies  pain     Hand Dominance Right   Extremity/Trunk Assessment Upper Extremity Assessment Upper Extremity Assessment: Overall WFL for tasks assessed   Lower Extremity Assessment Lower Extremity Assessment: Defer to PT evaluation   Cervical / Trunk Assessment Cervical / Trunk Assessment: Normal   Communication Communication Communication: No difficulties   Cognition Arousal/Alertness: Awake/alert Behavior During Therapy: WFL for tasks assessed/performed Overall Cognitive Status: Within Functional Limits for tasks assessed                                     General Comments       Exercises     Shoulder Instructions      Home Living Family/patient expects to be discharged to:: Private residence Living Arrangements: Alone Available Help at Discharge: Family;Friend(s);Available PRN/intermittently Type of Home: Apartment Home Access: Elevator     Home Layout: One level     Bathroom Shower/Tub: Tub/shower unit;Curtain   Bathroom Toilet: Standard     Home Equipment: Grab bars - toilet;Grab bars - tub/shower  Prior Functioning/Environment Level of Independence: Independent        Comments: completely independent prior to admission        OT Problem List:        OT Treatment/Interventions:      OT Goals(Current goals can be found in the care plan section) Acute Rehab OT Goals Patient Stated Goal: to get back to normal OT Goal Formulation: All assessment and education complete, DC therapy  OT Frequency:     Barriers to D/C:            Co-evaluation              AM-PAC PT "6 Clicks" Daily Activity     Outcome Measure Help from another person eating meals?: None Help from another person taking care of personal grooming?: None Help from another person toileting, which includes using toliet, bedpan, or urinal?: None Help from another person bathing (including washing, rinsing, drying)?: None Help from another person to put  on and taking off regular upper body clothing?: None Help from another person to put on and taking off regular lower body clothing?: None 6 Click Score: 24   End of Session    Activity Tolerance: Patient tolerated treatment well Patient left: in chair;with call bell/phone within reach  OT Visit Diagnosis: Muscle weakness (generalized) (M62.81)                Time: 3762-8315 OT Time Calculation (min): 11 min Charges:  OT General Charges $OT Visit: 1 Procedure OT Evaluation $OT Eval Low Complexity: 1 Procedure G-Codes:     Dalissa Lovin A. Brett Albino, M.S., OTR/L Pager: [phone]  Gaye Alken 03/28/2017, 2:15 PM

## 2017-03-28 NOTE — Progress Notes (Addendum)
Patient ID: Deborah Ho, female   DOB: 1951-02-23, 66 y.o.   MRN: [id number]  PROGRESS NOTE    Deborah Ho  WEX:[id number] DOB: 10/23/1951 DOA: 03/26/2017 PCP: Grayce Sessions, NP   Brief Narrative:  66 y.o.femalewith medical history significant of coronary artery disease, hypertension, dyslipidemia, GERD, combined systolic and diastolic heart failure with last echo in 3/18 showing ejection fraction of 20-25%, recent hospitalization in April 2018 for CHF exacerbation, lower extremity cellulitis and ulcer present to the emergency room with complains of worsening abdominal swelling for the last 2 weeks and was admitted for acute and chronic CHF. She was started on intravenous Lasix. She is feeling a little better and making good urine output.  Assessment & Plan:   Principal Problem:   Acute exacerbation of CHF (congestive heart failure) (HCC) Active Problems:   Essential hypertension   CKD (chronic kidney disease), stage III   Abdominal distension   1. Acute on chronic systolic heart failure: - Increase Lasix to 80 mg IV every 8 hours. Strict input and output, daily weights, monitor creatinine. Negative balance of 1290 mL over the last 24 hours -Spoke to Dr. Antoine Poche and will get heart failure team to evaluate the patient tomorrowas per patient's request to adjust to doses of diuretics on discharge - Continue spironolactone and Coreg  - Echo in 3/18 had shown ejection fraction of 20-25%, moderately dilated RV with moderately decreased systolic function  - Hold torsemide for now  -Check a.m. labs  2. Abdominal distention: Probably from heart failure. Ultrasound of abdomen did not show any ascites  3. History of coronary artery disease: Continue with Coreg, Brilinta and Lasix.   4. Hypertension: Monitor blood pressure, continue with Coreg, Lasix and spironolactone  5. Chronic kidney disease stage III: Creatinine at baseline, monitor creatinine with the use of  diuretics  6. Elevated bilirubin and alkaline phosphatase: Still slightly elevated. Ultrasound of abdomen did not show any abnormalities in the liver. Probable outpatient GI evaluation if needed  7. Hypokalemia: Replace potassium, repeat a.m. labs   DVT prophylaxis: Lovenox Code Status:  Full Family Communication: No family at bedside Disposition Plan: Home in 1-3 days  Consultants:  called cardiology consult Procedures: None  Antimicrobials: None   Subjective: Patient seen and examined at bedside. She feels much better and is making good urine output. She still slightly short of breath with exertion. No overnight chest pain, nausea, vomiting  Objective: Vitals:   03/27/17 1727 03/27/17 1951 03/28/17 0025 03/28/17 0523  BP: 130/78 128/68 (!) 138/91 107/64  Pulse: 79 79 78 79  Resp:  18 18 18   Temp:  98.4 F (36.9 C) 98.6 F (37 C) 97.9 F (36.6 C)  TempSrc:  Oral Oral Oral  SpO2:  97% 95% 96%  Weight:    100.7 kg (221 lb 14.4 oz)  Height:        Intake/Output Summary (Last 24 hours) at 03/28/17 1004 Last data filed at 03/28/17 0940  Gross per 24 hour  Intake              960 ml  Output             2250 ml  Net            -1290 ml   Filed Weights   03/26/17 2100 03/27/17 0434 03/28/17 0523  Weight: 101.8 kg (224 lb 8 oz) 100.9 kg (222 lb 8 oz) 100.7 kg (221 lb 14.4 oz)    Examination:  General exam: Appears  calm and comfortable  Respiratory system: Bilateral decreased breath sound at bases with scattered crackles Cardiovascular system: S1 & S2 heard, rate controlled  Gastrointestinal system: Abdomen is distended, soft and nontender. No organomegaly or masses felt. Normal bowel sounds heard. Extremities: No cyanosis, clubbing, 1-2+ pitting pedal edema     Data Reviewed: I have personally reviewed following labs and imaging studies  CBC:  Recent Labs Lab 03/26/17 1405 03/27/17 0307  WBC 8.5 8.0  HGB 14.0 13.4  HCT 46.0 44.0  MCV 85.0 84.9  PLT  331 313   Basic Metabolic Panel:  Recent Labs Lab 03/26/17 1405 03/27/17 0307 03/28/17 0337  NA 142 142 140  K 4.3 3.4* 3.4*  CL 111 107 103  CO2 22 23 26   GLUCOSE 96 85 105*  BUN 17 17 19   CREATININE 1.25* 1.33* 1.36*  CALCIUM 9.0 9.1 9.0  MG  --   --  1.9   GFR: Estimated Creatinine Clearance: 47.6 mL/min (A) (by C-G formula based on SCr of 1.36 mg/dL (H)). Liver Function Tests:  Recent Labs Lab 03/26/17 1619 03/27/17 0307  AST 25 22  ALT 14 12*  ALKPHOS 199* 178*  BILITOT 2.0* 2.3*  PROT 6.9 6.3*  ALBUMIN 3.4* 3.1*   No results for input(s): LIPASE, AMYLASE in the last 168 hours. No results for input(s): AMMONIA in the last 168 hours. Coagulation Profile: No results for input(s): INR, PROTIME in the last 168 hours. Cardiac Enzymes: No results for input(s): CKTOTAL, CKMB, CKMBINDEX, TROPONINI in the last 168 hours. BNP (last 3 results) No results for input(s): PROBNP in the last 8760 hours. HbA1C: No results for input(s): HGBA1C in the last 72 hours. CBG: No results for input(s): GLUCAP in the last 168 hours. Lipid Profile: No results for input(s): CHOL, HDL, LDLCALC, TRIG, CHOLHDL, LDLDIRECT in the last 72 hours. Thyroid Function Tests: No results for input(s): TSH, T4TOTAL, FREET4, T3FREE, THYROIDAB in the last 72 hours. Anemia Panel: No results for input(s): VITAMINB12, FOLATE, FERRITIN, TIBC, IRON, RETICCTPCT in the last 72 hours. Sepsis Labs: No results for input(s): PROCALCITON, LATICACIDVEN in the last 168 hours.  No results found for this or any previous visit (from the past 240 hour(s)).       Radiology Studies: Dg Chest 2 View  Result Date: 03/26/2017 CLINICAL DATA:  Ascites. EXAM: CHEST  2 VIEW COMPARISON:  Radiograph of February 18, 2017. FINDINGS: Stable cardiomegaly is noted. No pneumothorax is noted. Left lung is clear. Mild right pleural effusion is noted. Mild right basilar subsegmental atelectasis or infiltrate is noted. Bony thorax is  unremarkable. IMPRESSION: Mild right basilar subsegmental atelectasis or infiltrate is noted with associated mild right pleural effusion. Electronically Signed   By: Lupita Raider, M.D.   On: 03/26/2017 14:39   US Abdomen Complete  Result Date: 03/27/2017 CLINICAL DATA:  Abdominal distension. EXAM: ABDOMEN ULTRASOUND COMPLETE COMPARISON:  None. FINDINGS: Gallbladder: The gallbladder has been surgically removed. Common bile duct: Diameter: 3 mm Liver: No focal lesion identified. Within normal limits in parenchymal echogenicity. IVC: No abnormality visualized. Pancreas: Poorly visualized. Visualized portions of the pancreatic head are normal. Spleen: Size and appearance within normal limits. Right Kidney: Length: 10 cm. Echogenicity within normal limits. No mass or hydronephrosis visualized. Left Kidney: Length: 10 cm. Echogenicity within normal limits. No mass or hydronephrosis visualized. Abdominal aorta: No aneurysm visualized. Other findings: None. IMPRESSION: Difficult study due to patient body habitus and shadowing bowel gas. No acute abnormalities are identified. Electronically Signed  By: Gerome Sam III M.D   On: 03/27/2017 07:33   Dg Abd Acute W/chest  Result Date: 03/26/2017 CLINICAL DATA:  Abdominal distension. EXAM: DG ABDOMEN ACUTE W/ 1V CHEST COMPARISON:  Radiographs of June 26, 2016. FINDINGS: There is no evidence of dilated bowel loops or free intraperitoneal air. No radiopaque calculi or other significant radiographic abnormality is seen. Mild cardiomegaly is noted. Mild right basilar atelectasis or infiltrate is noted with associated pleural effusion. IMPRESSION: No evidence of bowel obstruction or ileus. Mild right basilar atelectasis or infiltrate is noted with associated pleural effusion. Electronically Signed   By: Lupita Raider, M.D.   On: 03/26/2017 17:03        Scheduled Meds: . atorvastatin  80 mg Oral q1800  . carvedilol  6.25 mg Oral BID WC  . enoxaparin  (LOVENOX) injection  40 mg Subcutaneous Q24H  . [START ON 03/29/2017] ferrous sulfate  325 mg Oral Once per day on Mon Thu  . furosemide  80 mg Intravenous Q8H  . loratadine  10 mg Oral Daily  . pantoprazole  20 mg Oral Daily  . sodium chloride flush  3 mL Intravenous Q12H  . sodium chloride flush  3 mL Intravenous Q12H  . spironolactone  25 mg Oral Daily  . ticagrelor  60 mg Oral BID   Continuous Infusions: . sodium chloride       LOS: 2 days        Glade Lloyd, MD [facility] Pager [phone]  If 7PM-7AM, please contact night-coverage [email or web address] Password [email or web address] 03/28/2017, 10:04 AM

## 2017-03-28 NOTE — Progress Notes (Signed)
Pt is stable, ambulating well, diuresing well, vitals stable, no any sign of distress and complain of pain, will continue to monitor the patient

## 2017-03-29 DIAGNOSIS — I5023 Acute on chronic systolic (congestive) heart failure: Secondary | ICD-10-CM

## 2017-03-29 DIAGNOSIS — I2510 Atherosclerotic heart disease of native coronary artery without angina pectoris: Secondary | ICD-10-CM

## 2017-03-29 DIAGNOSIS — I10 Essential (primary) hypertension: Secondary | ICD-10-CM

## 2017-03-29 LAB — BASIC METABOLIC PANEL
Anion gap: 13 (ref 5–15)
BUN: 19 mg/dL (ref 6–20)
CO2: 29 mmol/L (ref 22–32)
Calcium: 9.1 mg/dL (ref 8.9–10.3)
Chloride: 100 mmol/L — ABNORMAL LOW (ref 101–111)
Creatinine, Ser: 1.25 mg/dL — ABNORMAL HIGH (ref 0.44–1.00)
GFR calc Af Amer: 51 mL/min — ABNORMAL LOW (ref >60)
GFR calc non Af Amer: 44 mL/min — ABNORMAL LOW (ref >60)
Glucose, Bld: 89 mg/dL (ref 65–99)
Potassium: 3.5 mmol/L (ref 3.5–5.1)
Sodium: 142 mmol/L (ref 135–145)

## 2017-03-29 LAB — MAGNESIUM: Magnesium: 2 mg/dL (ref 1.7–2.4)

## 2017-03-29 MED ORDER — POTASSIUM CHLORIDE CRYS ER 20 MEQ PO TBCR
40.0000 meq | EXTENDED_RELEASE_TABLET | Freq: Once | ORAL | Status: AC
  Administered 2017-03-29: 40 meq via ORAL
  Filled 2017-03-29: qty 2

## 2017-03-29 MED ORDER — METOLAZONE 5 MG PO TABS
5.0000 mg | ORAL_TABLET | Freq: Once | ORAL | Status: AC
  Administered 2017-03-29: 5 mg via ORAL
  Filled 2017-03-29: qty 1

## 2017-03-29 MED ORDER — LOSARTAN POTASSIUM 25 MG PO TABS
25.0000 mg | ORAL_TABLET | Freq: Every day | ORAL | Status: DC
  Administered 2017-03-29 – 2017-04-01 (×2): 25 mg via ORAL
  Filled 2017-03-29 (×4): qty 1

## 2017-03-29 NOTE — Consult Note (Signed)
Advanced Heart Failure Team Consult Note   Primary Physician: [name] Primary Cardiologist:  Dr. Shirlee Latch   Reason for Consultation: Acute on chronic systolic CHF   HPI:    Deborah Ho is seen today for evaluation of acute on chronic systolic CHF at the request of Dr. Hanley Ben.   Deborah Ho is a 66 y.o. female with a history of HTN, HLD, tobacco abuse, CAD s/panterior STEMI with early stent thrombosis for missing doses of Brilinta s/p PCI x 2 into LAD (04/2014) and chronic systolic HF with EF 20% by 3/16 echo.   Admitted to [facility] in 3/16 with CHF and R pleural effusion. Effusion tapped (transudative). Echo with EF down to 20%. Diuresed with IV lasix and transitioned torsemide + metolazone as needed for weight >187 pounds. Creatinine was 1.7 on the day discharge. Discharge weight 183 pounds.   She was readmitted in 4/16 with CHF in the setting of dietary and ?medical noncompliance.  She was diuresed with IV Lasix and discharged.    Admitted 02/20/16 - 02/25/16 with volume overload. She had not been compliant with her torsemide.  She diuresed 11.5 L and 29 lbs. Discharge 195 lbs.  Admitted 06/26/2016 through 07/06/2016 with marked volume overload. Diuresed with IV lasix and transitioned to torsemide 60 mg daily. Discharge weight 190 pounds. Referred to Paramedicine.   Admitted 3/15 -> 02/06/17 with A/C combined CHF. Diuresed 16.4L and down 30 lbs. Also treated with IV vanc and d/c on Keflex for LLE ulcer/cellulitis. Discharge weight was 198 pounds.   She presented to the ED on 03/26/17 with acute SOB, bilateral lower extremity edema. Weight on admission was 224 pounds. She has been taking 20mg  of torsemide daily, instead of 60mg  daily as ordered. Also has been drinking more than 2L a day, eating high salt foods including frozen spaghetti. She noted that her abdomen was tight and swollen, endorses orthopnea and PND. Denies chest pain and palpitations. Noted that her weight was  steadily increasing, could not walk to her car from her apartment without SOB.     Review of Systems: [y] = yes, [ ]  = no   General: Weight gain [ y]; Weight loss [ ] ; Anorexia [ ] ; Fatigue [ ] ; Fever [ ] ; Chills [ ] ; Weakness [ ]   Cardiac: Chest pain/pressure [ ] ; Resting SOB Cove.Etienne ]; Exertional SOB [ y]; Orthopnea [ y]; Pedal Edema [ y]; Palpitations [ ] ; Syncope [ ] ; Presyncope [ ] ; Paroxysmal nocturnal dyspnea[ ]   Pulmonary: Cough [ ] ; Wheezing[ ] ; Hemoptysis[ ] ; Sputum [ ] ; Snoring [ ]   GI: Vomiting[ ] ; Dysphagia[ ] ; Melena[ ] ; Hematochezia [ ] ; Heartburn[ ] ; Abdominal pain [ ] ; Constipation [ ] ; Diarrhea [ ] ; BRBPR [ ]   GU: Hematuria[ ] ; Dysuria [ ] ; Nocturia[ ]   Vascular: Pain in legs with walking [ ] ; Pain in feet with lying flat [ ] ; Non-healing sores [ ] ; Stroke [ ] ; TIA [ ] ; Slurred speech [ ] ;  Neuro: Headaches[ ] ; Vertigo[ ] ; Seizures[ ] ; Paresthesias[ ] ;Blurred vision [ ] ; Diplopia [ ] ; Vision changes [ ]   Ortho/Skin: Arthritis Cove.Etienne ]; Joint pain [ ] ; Muscle pain [ ] ; Joint swelling [ ] ; Back Pain [ ] ; Rash [ ]   Psych: Depression[ ] ; Anxiety[ ]   Heme: Bleeding problems [ ] ; Clotting disorders [ ] ; Anemia [ ]   Endocrine: Diabetes [ ] ; Thyroid dysfunction[ ]   Home Medications Prior to Admission medications   Medication Sig Start Date End Date Taking? Authorizing Provider  atorvastatin (LIPITOR)  80 MG tablet Take 1 tablet (80 mg total) by mouth daily at 6 PM. 07/06/16  Yes Johnson, Clanford L, MD  carvedilol (COREG) 6.25 MG tablet Take 1 tablet (6.25 mg total) by mouth 2 (two) times daily with a meal. Patient taking differently: Take 6.25 mg by mouth daily.  02/06/17  Yes Zannie Cove, MD  cetirizine (ZYRTEC) 10 MG tablet Take 10 mg by mouth daily as needed for allergies.    Yes [provider]  furosemide (LASIX) 40 MG tablet Take 40 mg by mouth 2 (two) times daily.   Yes [provider]  pantoprazole (PROTONIX) 20 MG tablet Take 20 mg by mouth daily. 02/24/17  Yes  [provider]  potassium chloride SA (K-DUR,KLOR-CON) 20 MEQ tablet Take 20-40 mEq by mouth 2 (two) times daily. 2 tabs in the morning and 1 tablet in the evening   Yes [provider]  spironolactone (ALDACTONE) 25 MG tablet Take 1 tablet (25 mg total) by mouth daily. 02/06/17  Yes Zannie Cove, MD  ferrous sulfate 325 (65 FE) MG tablet Take 325 mg by mouth 2 (two) times a week. Tuesdays and Thursdays    [provider]  nitroGLYCERIN (NITROSTAT) 0.4 MG SL tablet Place 0.4 mg under the tongue every 5 (five) minutes as needed for chest pain.    [provider]  ticagrelor (BRILINTA) 60 MG TABS tablet Take 1 tablet (60 mg total) by mouth 2 (two) times daily. Patient not taking: Reported on 03/26/2017 07/22/16   Laurey Morale, MD  torsemide (DEMADEX) 20 MG tablet Take 3 tablets (60 mg total) by mouth daily. Patient not taking: Reported on 03/26/2017 02/06/17   Zannie Cove, MD    Past Medical History: Past Medical History:  Diagnosis Date  . CAD (coronary artery disease) 05/03/14; 05/09/14   a. anterior STEMI with early in-stent thorombosis for missed dose of Brillinta s/p PCI with DESx 2 into LAD (04/2014)  . CHF (congestive heart failure) (HCC)   . GERD (gastroesophageal reflux disease)    Probable  . HTN (hypertension)   . Hyperlipidemia   . Ischemic cardiomyopathy    a. 04/2014 ECHO with EF 45-50% b.  Repeat 2D echo 08/14/14 with EF down at 15%. Life vest placed  . Non compliance w medication regimen   . Tobacco use     Past Surgical History: Past Surgical History:  Procedure Laterality Date  . CHOLECYSTECTOMY    . CORONARY ANGIOPLASTY WITH STENT PLACEMENT  05/03/14   STEMI- stent to LAD DES- Xience alpine  . CORONARY ANGIOPLASTY WITH STENT PLACEMENT  05/09/14   STEMI- overlapping stent to LAD, pt had missed a dose of Brilinta  . LEFT HEART CATHETERIZATION WITH CORONARY ANGIOGRAM N/A 05/03/2014   Procedure: LEFT HEART CATHETERIZATION WITH  CORONARY ANGIOGRAM;  Surgeon: Peter M Swaziland, MD;  Location[facility] C[facility] LAB;  Service: Cardiovascular;  Laterality: N/A;  . LEFT HEART CATHETERIZATION WITH CORONARY ANGIOGRAM N/A 05/09/2014   Procedure: LEFT HEART CATHETERIZATION WITH CORONARY ANGIOGRAM;  Surgeon: Corky Crafts, MD;  Location: [facility] CATH LAB;  Service: Cardiovascular;  Laterality: N/A;  . PARTIAL HYSTERECTOMY    . PERCUTANEOUS STENT INTERVENTION  05/03/2014   Procedure: PERCUTANEOUS STENT INTERVENTION;  Surgeon: Peter M Swaziland, MD;  Location: [facility] CATH LAB;  Service: Cardiovascular;;  DES Prox LAD     Family History: Family History  Problem Relation Age of Onset  . Diabetes Mother   . Hypertension Mother     Social History: Social History  Social History  . Marital status: Widowed    Spouse name: N/A  . Number of children: N/A  . Years of education: N/A   Occupational History  . not employed    Social History Main Topics  . Smoking status: Current Some Day Smoker    Packs/day: 0.10    Years: 0.50    Types: Cigarettes  . Smokeless tobacco: Current User     Comment: down to 3 cigarettes daily (08/03/14)  . Alcohol use No  . Drug use: No  . Sexual activity: Not Asked   Other Topics Concern  . None   Social History Narrative   Patient has 6 brothers and sisters and none have known coronary artery disease. She lives alone in [place], but has several brothers in the area.    Allergies:  Allergies  Allergen Reactions  . Aspirin Swelling    Chewable children's aspirin makes patients tongue and face swell  . Effient [Prasugrel] Swelling    Patient's tongue and face swells  . Entresto [Sacubitril-Valsartan] Other (See Comments)    dizziness  . Lactose Intolerance (Gi) Other (See Comments)    REACTION: stomach upset  . Robitussin Dm [Guaifenesin-Dm] Swelling    Patient's tongue swells  . Sulfa Antibiotics Swelling  . Wheat Bran Other (See Comments)    REACTION: unknown    Objective:    Vital  Signs:   Temp:  [97.7 F (36.5 C)-98.5 F (36.9 C)] 98.5 F (36.9 C) (05/14 0539) Pulse Rate:  [81-85] 81 (05/14 0808) Resp:  [18] 18 (05/14 0539) BP: (116-143)/(66-87) 128/66 (05/14 0808) SpO2:  [94 %-100 %] 95 % (05/14 0539) Weight:  [215 lb 9.6 oz (97.8 kg)] 215 lb 9.6 oz (97.8 kg) (05/14 0539) Last BM Date: 03/29/17  Weight change: Filed Weights   03/27/17 0434 03/28/17 0523 03/29/17 0539  Weight: 222 lb 8 oz (100.9 kg) 221 lb 14.4 oz (100.7 kg) 215 lb 9.6 oz (97.8 kg)    Intake/Output:   Intake/Output Summary (Last 24 hours) at 03/29/17 1135 Last data filed at 03/29/17 0951  Gross per 24 hour  Intake             1400 ml  Output             5400 ml  Net            -4000 ml     Physical Exam: General: Female, NAD. Sitting up in chair.  HEENT: normal Neck: supple. JVP to ear . Carotids 2+ bilat; no bruits. No lymphadenopathy or thyromegaly appreciated. Cor: PMI nondisplaced. Regular rate & rhythm. No rubs, gallops or murmurs. Lungs: crackles in bilateral lower lobes.  Abdomen: soft, nontender. Distended. No hepatosplenomegaly. No bruits or masses. Good bowel sounds. Extremities: no cyanosis, clubbing, rash. 2+ edema to knees.  Neuro: alert & orientedx3, cranial nerves grossly intact. moves all 4 extremities w/o difficulty. Affect pleasant  Telemetry: NSR.   Labs: Basic Metabolic Panel:  Recent Labs Lab 03/26/17 1405 03/27/17 0307 03/28/17 0337 03/29/17 0418  NA 142 142 140 142  K 4.3 3.4* 3.4* 3.5  CL 111 107 103 100*  CO2 22 23 26 29   GLUCOSE 96 85 105* 89  BUN 17 17 19 19   CREATININE 1.25* 1.33* 1.36* 1.25*  CALCIUM 9.0 9.1 9.0 9.1  MG  --   --  1.9 2.0    Liver Function Tests:  Recent Labs Lab 03/26/17 1619 03/27/17 0307  AST 25 22  ALT 14 12*  ALKPHOS  199* 178*  BILITOT 2.0* 2.3*  PROT 6.9 6.3*  ALBUMIN 3.4* 3.1*   No results for input(s): LIPASE, AMYLASE in the last 168 hours. No results for input(s): AMMONIA in the last 168  hours.  CBC:  Recent Labs Lab 03/26/17 1405 03/27/17 0307  WBC 8.5 8.0  HGB 14.0 13.4  HCT 46.0 44.0  MCV 85.0 84.9  PLT 331 313    Cardiac Enzymes: No results for input(s): CKTOTAL, CKMB, CKMBINDEX, TROPONINI in the last 168 hours.  BNP: BNP (last 3 results)  Recent Labs  08/06/16 1600 01/28/17 2047 03/26/17 1405  BNP 263.7* 1,920.0* 1,679.1*    ProBNP (last 3 results) No results for input(s): PROBNP in the last 8760 hours.   CBG: No results for input(s): GLUCAP in the last 168 hours.  Coagulation Studies: No results for input(s): LABPROT, INR in the last 72 hours.  Other results: EKG: normal EKG, normal sinus rhythm.  Imaging:  No results found.   Medications:     Current Medications: . atorvastatin  80 mg Oral q1800  . carvedilol  6.25 mg Oral BID WC  . enoxaparin (LOVENOX) injection  40 mg Subcutaneous Q24H  . ferrous sulfate  325 mg Oral Once per day on Mon Thu  . furosemide  80 mg Intravenous Q8H  . loratadine  10 mg Oral Daily  . pantoprazole  20 mg Oral Daily  . sodium chloride flush  3 mL Intravenous Q12H  . sodium chloride flush  3 mL Intravenous Q12H  . spironolactone  25 mg Oral Daily  . ticagrelor  60 mg Oral BID     Infusions: . sodium chloride        Assessment/Plan   1. Acute on chronic systolic CHF: ICM, Echo 02/21/16 15%. Echo 02/01/17 with LVEF 20-25%, grade 3 DD, Mild MR, Mild LAE, RV Mod dilated and reduced. Mild/Mod TR.  - NYHA II-III - Volume overloaded on exam.  - Continue IV diuresis with IV lasix 80mg  q 8 hours - Add 5mg  metolazone today, will give extra KCl.  - Continue - has refused Entresto in the past, refuses to re-challenge. Would restart losartan at 25mg  daily. BP elevated.  - Continue Coreg 6.25mg  BID.  - Has seen EP in the past for an ICD but declined until she could research it more on her own. This was about a year ago, will need to revisit this with her.  2. CAD s/p STEMI in 2015 - DES to mid  LAD.  - Remains on Brilinta for severe RCA disease - Continue atorvastatin.  - Allergic to ASA  3. HTN:  - Add back losartan 25 mg daily.  4. CKD stage III - GFR 51, creatinine 1.25. Improving - Follow with daily BMET.    Will refer to paramedicine.    Length of Stay: 3  [name], NP  03/29/2017, 11:35 AM  Advanced Heart Failure Team Pager [phone] (M-F; 7a - 4p)  Please contact [facility] for night-coverage after hours (4p -7a ) and weekends on [email or web address]  Patient seen with NP, agree with the above note.  She was admitted with recurrent acute on chronic systolic CHF.  She has not been taking her medications properly.  Only taking torsemide 20 mg daily rather than prescribed 60 mg daily.  She is markedly volume overloaded with weight increased.  - Lasix 80 mg IV every 8 hrs, will give a dose of metolazone today.  - Continue spironolactone.  Refuses Entresto, will restart losartan.  -  Can continue home dose of Coreg.   I am concerned that she is only taking Brilinta 60 mg once a day.  She is allergic to aspirin and Effient.  We discussed switching her over to once a day Plavix, but she says she has a lot of Brilinta at home and wants to keep taking it.  I tried to emphasize to her the need to take this medication bid rather than qd.    She is a good candidate for the paramedicine program, will arrange.   [name] 03/29/2017 3:47 PM

## 2017-03-29 NOTE — Consult Note (Signed)
WOC Nurse wound consult note Reason for Consult: leg wound. Patient reports she has been followed by the wound care center and by a [facility], she had a ulcer on the left posterior leg that is now healed Dressing procedure/placement/frequency: Foam to protect area that has healed, change 2x wk.  Advised patient of location of medical supply store in [place], she has a prescription for 15-2mmHG compression hose that she has not been able to find.  [facility] supply and measure the patient and provide her with the correct size and compression level.  Discussed POC with patient and bedside nurse.  Re consult if needed, will not follow at this time. Thanks  Tafari Humiston [employer], RN,CWOCN, CNS [phone])

## 2017-03-29 NOTE — Progress Notes (Signed)
Patient ID: Deborah Ho, female   DOB: 11-27-1950, 66 y.o.   MRN: [id number]  PROGRESS NOTE    Deborah Ho  YQM:[id number] DOB: 1951/03/14 DOA: 03/26/2017 PCP: Grayce Sessions, NP   Brief Narrative:  66 y.o.femalewith medical history significant of coronary artery disease, hypertension, dyslipidemia, GERD, combined systolic and diastolic heart failure with last echo in 3/18 showing ejection fraction of 20-25%, recent hospitalization in April 2018 for CHF exacerbation, lower extremity cellulitis and ulcer present to the emergency room with complains of worsening abdominal swelling for the last 2 weeks and was admitted for acute and chronic CHF. She was started on intravenous Lasix. She is feeling a little better and making good urine output. Lasix dose increased yesterday.   Assessment & Plan:   Principal Problem:   Acute exacerbation of CHF (congestive heart failure) (HCC) Active Problems:   Essential hypertension   CKD (chronic kidney disease), stage III   Abdominal distension   1. Acute on chronic systolic heart failure: - Continue Lasix to 80 mg IV every 8 hours. Strict input and output, daily weights, monitor creatinine.   Gross per 24 hour  Intake             1400 ml  Output             4500 ml  Net            -3100 ml    -Colored cardiology consult today to adjust the dose of diuretics as per patient's request - Continue spironolactone and Coreg  - Echo in 3/18 had shown ejection fraction of 20-25%, moderately dilated RV with moderately decreased systolic function  - Hold torsemide for now  -Check a.m. labs  2. Abdominal distention: Probably from heart failure. Ultrasound of abdomen did not show any ascites  3. History of coronary artery disease: Continue with Coreg, Brilinta and Lasix.   4. Hypertension: Monitor blood pressure, continue with Coreg, Lasix and spironolactone  5. Chronic kidney disease stage III: Creatinine at baseline, monitor creatinine  with the use of diuretics  6. Elevated bilirubin and alkaline phosphatase: Still slightly elevated. Ultrasound of abdomen did not show any abnormalities in the liver. Probable outpatient GI evaluation if needed  7. Hypokalemia: Improving, repeat a.m. labs   DVT prophylaxis:Lovenox Code Status:Full Family Communication:No family at bedside Disposition Plan:Home in 1-2 days  Consultants: called cardiology consult Procedures:None  Antimicrobials: None  Subjective: Patient seen and examined at bedside. She feels much improved since yesterday. No overnight fever, nausea, vomiting, chest pain.  Objective: Vitals:   03/28/17 1149 03/28/17 1900 03/29/17 0539 03/29/17 0808  BP: 137/78 116/78 (!) 143/87 128/66  Pulse: 83 81 85 81  Resp: 18 18 18    Temp: 97.7 F (36.5 C) 97.7 F (36.5 C) 98.5 F (36.9 C)   TempSrc: Oral Oral Oral   SpO2: 94% 100% 95%   Weight:   97.8 kg (215 lb 9.6 oz)   Height:        Intake/Output Summary (Last 24 hours) at 03/29/17 0954 Last data filed at 03/29/17 0900  Gross per 24 hour  Intake             1400 ml  Output             4500 ml  Net            -3100 ml   Filed Weights   03/27/17 0434 03/28/17 0523 03/29/17 0539  Weight: 100.9 kg (222 lb 8 oz) 100.7 kg (  221 lb 14.4 oz) 97.8 kg (215 lb 9.6 oz)    Examination:  General exam: Appears calm and comfortable  Respiratory system: Bilateral decreased breath sound at bases With scattered crackles Cardiovascular system: S1 & S2 heard, rate controlled  Gastrointestinal system: Abdomen is distended, soft and nontender. Normal bowel sounds heard. Extremities: No cyanosis, clubbing, 1-2+ pitting edema present    Data Reviewed: I have personally reviewed following labs and imaging studies  CBC:  Recent Labs Lab 03/26/17 1405 03/27/17 0307  WBC 8.5 8.0  HGB 14.0 13.4  HCT 46.0 44.0  MCV 85.0 84.9  PLT 331 313   Basic Metabolic Panel:  Recent Labs Lab 03/26/17 1405  03/27/17 0307 03/28/17 0337 03/29/17 0418  NA 142 142 140 142  K 4.3 3.4* 3.4* 3.5  CL 111 107 103 100*  CO2 22 23 26 29   GLUCOSE 96 85 105* 89  BUN 17 17 19 19   CREATININE 1.25* 1.33* 1.36* 1.25*  CALCIUM 9.0 9.1 9.0 9.1  MG  --   --  1.9 2.0   GFR: Estimated Creatinine Clearance: 50.9 mL/min (A) (by C-G formula based on SCr of 1.25 mg/dL (H)). Liver Function Tests:  Recent Labs Lab 03/26/17 1619 03/27/17 0307  AST 25 22  ALT 14 12*  ALKPHOS 199* 178*  BILITOT 2.0* 2.3*  PROT 6.9 6.3*  ALBUMIN 3.4* 3.1*   No results for input(s): LIPASE, AMYLASE in the last 168 hours. No results for input(s): AMMONIA in the last 168 hours. Coagulation Profile: No results for input(s): INR, PROTIME in the last 168 hours. Cardiac Enzymes: No results for input(s): CKTOTAL, CKMB, CKMBINDEX, TROPONINI in the last 168 hours. BNP (last 3 results) No results for input(s): PROBNP in the last 8760 hours. HbA1C: No results for input(s): HGBA1C in the last 72 hours. CBG: No results for input(s): GLUCAP in the last 168 hours. Lipid Profile: No results for input(s): CHOL, HDL, LDLCALC, TRIG, CHOLHDL, LDLDIRECT in the last 72 hours. Thyroid Function Tests: No results for input(s): TSH, T4TOTAL, FREET4, T3FREE, THYROIDAB in the last 72 hours. Anemia Panel: No results for input(s): VITAMINB12, FOLATE, FERRITIN, TIBC, IRON, RETICCTPCT in the last 72 hours. Sepsis Labs: No results for input(s): PROCALCITON, LATICACIDVEN in the last 168 hours.  No results found for this or any previous visit (from the past 240 hour(s)).       Radiology Studies: No results found.      Scheduled Meds: . atorvastatin  80 mg Oral q1800  . carvedilol  6.25 mg Oral BID WC  . enoxaparin (LOVENOX) injection  40 mg Subcutaneous Q24H  . ferrous sulfate  325 mg Oral Once per day on Mon Thu  . furosemide  80 mg Intravenous Q8H  . loratadine  10 mg Oral Daily  . pantoprazole  20 mg Oral Daily  . sodium chloride  flush  3 mL Intravenous Q12H  . sodium chloride flush  3 mL Intravenous Q12H  . spironolactone  25 mg Oral Daily  . ticagrelor  60 mg Oral BID   Continuous Infusions: . sodium chloride       LOS: 3 days        05/27/17, MD [facility] Pager [phone]  If 7PM-7AM, please contact night-coverage [email or web address] Password [facility] 03/29/2017, 9:54 AM

## 2017-03-29 NOTE — Progress Notes (Signed)
Re: Venous reflux order. Vascular lab does not perform this test as IP. Spoke with patient's nurse.   Farrel Demark, RDMS, RVT 03/29/2017

## 2017-03-29 NOTE — Progress Notes (Signed)
I know Deborah Ho very well from previous hospitalizations. She has previously been referred to the HF [facility] 3 times.  In the past after leaving the hospital she becomes difficult to reach via the phone and requests to no longer have home visits.  I will again speak with her about this program.  If she is not agreeable she will not be referred to the program due to failed attempts in the past.

## 2017-03-29 NOTE — Progress Notes (Signed)
While giving due meds ( Coreg and K) to this patient,I saw her putting something in her pocket, when I asked what is she hiding she said nothing,nothing is in there". I went ahead and check and found Coreg tablet inside her pocket. " She said my blood pressure is too low" ( BP 128/90 HR 86). Patient was educated  The importance of this medication.

## 2017-03-29 NOTE — Progress Notes (Signed)
Received a call from Vascular lab informing me that  They don't do Venous Reflux as In pt. [facility] paged made aware.

## 2017-03-30 DIAGNOSIS — I5043 Acute on chronic combined systolic (congestive) and diastolic (congestive) heart failure: Secondary | ICD-10-CM

## 2017-03-30 LAB — MAGNESIUM: Magnesium: 1.9 mg/dL (ref 1.7–2.4)

## 2017-03-30 LAB — BASIC METABOLIC PANEL
Anion gap: 11 (ref 5–15)
BUN: 24 mg/dL — ABNORMAL HIGH (ref 6–20)
CO2: 30 mmol/L (ref 22–32)
Calcium: 9.6 mg/dL (ref 8.9–10.3)
Chloride: 97 mmol/L — ABNORMAL LOW (ref 101–111)
Creatinine, Ser: 1.36 mg/dL — ABNORMAL HIGH (ref 0.44–1.00)
GFR calc Af Amer: 46 mL/min — ABNORMAL LOW (ref >60)
GFR calc non Af Amer: 40 mL/min — ABNORMAL LOW (ref >60)
Glucose, Bld: 103 mg/dL — ABNORMAL HIGH (ref 65–99)
Potassium: 3.7 mmol/L (ref 3.5–5.1)
Sodium: 138 mmol/L (ref 135–145)

## 2017-03-30 MED ORDER — POTASSIUM CHLORIDE CRYS ER 20 MEQ PO TBCR
40.0000 meq | EXTENDED_RELEASE_TABLET | Freq: Two times a day (BID) | ORAL | Status: DC
  Administered 2017-03-30 – 2017-04-01 (×6): 40 meq via ORAL
  Filled 2017-03-30 (×6): qty 2

## 2017-03-30 MED ORDER — METOLAZONE 2.5 MG PO TABS
2.5000 mg | ORAL_TABLET | Freq: Once | ORAL | Status: AC
  Administered 2017-03-30: 2.5 mg via ORAL
  Filled 2017-03-30: qty 1

## 2017-03-30 NOTE — Progress Notes (Signed)
Patient ID: Deborah Ho, female   DOB: [birth year], 66 y.o.   MRN: [id number]  PROGRESS NOTE    Deborah Ho  SWF:[id number] DOB: [birth year] DOA: 03/26/2017 PCP: Deborah Sessions, NP   Brief Narrative:  66 y.o.femalewith medical history significant of coronary artery disease, hypertension, dyslipidemia, GERD, combined systolic and diastolic heart failure with last echo in 3/18 showing ejection fraction of 20-25%, recent hospitalization in April 2018 for CHF exacerbation, lower extremity cellulitis and ulcer present to the emergency room with complains of worsening abdominal swelling for the last 2 weeks and was admitted for acute and chronic CHF. She was started on intravenous Lasix. She is feeling a little better and making good urine output. Cardiology has evaluated the patient. Metolazone has been added.   Assessment & Plan:   Principal Problem:   Acute exacerbation of CHF (congestive heart failure) (HCC) Active Problems:   Essential hypertension   CKD (chronic kidney disease), stage III   Abdominal distension   1. Acute on chronic systolic heart failure: - Cardiology evaluation and follow-up has been appreciated. - Continue Lasix to 80 mg IV every 8 hours along with metolazone. Strict input and output, daily weights, monitor creatinine.   Gross per 24 hour  Intake              460 ml  Output             3401 ml  Net            -2941 ml   - Continue spironolactone, Losartan and Coreg  - Echo in 3/18 had shown ejection fraction of 20-25%, moderately dilated RV with moderately decreased systolic function  - Hold torsemide for now  -Check a.m. labs  2. Abdominal distention: Probably from heart failure. Ultrasound of abdomen did not show any ascites  3. History of coronary artery disease: Continue with Coreg, Brilinta and Lasix.   4. Hypertension: Monitor blood pressure, continue with Coreg, Lasix and spironolactone  5. Chronic kidney disease stage III: Creatinine at  baseline, monitor creatinine with the use of diuretics  6. Elevated bilirubin and alkaline phosphatase: Still slightly elevated. Ultrasound of abdomen did not show any abnormalities in the liver. Probable outpatient GI evaluation if needed  7. Hypokalemia: Improving, repeat a.m. labs   DVT prophylaxis:Lovenox Code Status:Full Family Communication:No family at bedside Disposition Plan:Home in 1-3 days once cleared by cardiology  Consultants:called cardiology consult Procedures:None  Antimicrobials: None  Subjective: Patient seen and examined at bedside. She denies any overnight chest pain, nausea, vomiting. She feels better.  Objective: Vitals:   03/29/17 2104 03/30/17 0313 03/30/17 0314 03/30/17 0852  BP: 121/66 124/68  126/72  Pulse: 79 79  83  Resp: 20 20    Temp: 97.5 F (36.4 C) 97.6 F (36.4 C)    TempSrc: Oral Oral    SpO2: 96% 96%    Weight:   94.3 kg (208 lb)   Height:        Intake/Output Summary (Last 24 hours) at 03/30/17 0957 Last data filed at 03/30/17 0550  Gross per 24 hour  Intake              460 ml  Output             3401 ml  Net            -2941 ml   Filed Weights   03/28/17 0523 03/29/17 0539 03/30/17 0314  Weight: 100.7 kg (221 lb 14.4 oz) 97.8 kg (215  lb 9.6 oz) 94.3 kg (208 lb)    Examination:  General exam: Appears calm and comfortable  Respiratory system: Bilateral decreased breath sound at bases With basilar crackles Cardiovascular system: S1 & S2 heard, rate controlled  Gastrointestinal system: Abdomen is distended, soft and nontender. Normal bowel sounds heard. Extremities: No cyanosis, clubbing; 2+ pitting pedal edema   Data Reviewed: I have personally reviewed following labs and imaging studies  CBC:  Recent Labs Lab 03/26/17 1405 03/27/17 0307  WBC 8.5 8.0  HGB 14.0 13.4  HCT 46.0 44.0  MCV 85.0 84.9  PLT 331 313   Basic Metabolic Panel:  Recent Labs Lab 03/26/17 1405 03/27/17 0307 03/28/17 0337  03/29/17 0418 03/30/17 0450  NA 142 142 140 142 138  K 4.3 3.4* 3.4* 3.5 3.7  CL 111 107 103 100* 97*  CO2 22 23 26 29 30   GLUCOSE 96 85 105* 89 103*  BUN 17 17 19 19  24*  CREATININE 1.25* 1.33* 1.36* 1.25* 1.36*  CALCIUM 9.0 9.1 9.0 9.1 9.6  MG  --   --  1.9 2.0 1.9   GFR: Estimated Creatinine Clearance: 45.9 mL/min (A) (by C-G formula based on SCr of 1.36 mg/dL (H)). Liver Function Tests:  Recent Labs Lab 03/26/17 1619 03/27/17 0307  AST 25 22  ALT 14 12*  ALKPHOS 199* 178*  BILITOT 2.0* 2.3*  PROT 6.9 6.3*  ALBUMIN 3.4* 3.1*   No results for input(s): LIPASE, AMYLASE in the last 168 hours. No results for input(s): AMMONIA in the last 168 hours. Coagulation Profile: No results for input(s): INR, PROTIME in the last 168 hours. Cardiac Enzymes: No results for input(s): CKTOTAL, CKMB, CKMBINDEX, TROPONINI in the last 168 hours. BNP (last 3 results) No results for input(s): PROBNP in the last 8760 hours. HbA1C: No results for input(s): HGBA1C in the last 72 hours. CBG: No results for input(s): GLUCAP in the last 168 hours. Lipid Profile: No results for input(s): CHOL, HDL, LDLCALC, TRIG, CHOLHDL, LDLDIRECT in the last 72 hours. Thyroid Function Tests: No results for input(s): TSH, T4TOTAL, FREET4, T3FREE, THYROIDAB in the last 72 hours. Anemia Panel: No results for input(s): VITAMINB12, FOLATE, FERRITIN, TIBC, IRON, RETICCTPCT in the last 72 hours. Sepsis Labs: No results for input(s): PROCALCITON, LATICACIDVEN in the last 168 hours.  No results found for this or any previous visit (from the past 240 hour(s)).       Radiology Studies: No results found.      Scheduled Meds: . atorvastatin  80 mg Oral q1800  . carvedilol  6.25 mg Oral BID WC  . enoxaparin (LOVENOX) injection  40 mg Subcutaneous Q24H  . ferrous sulfate  325 mg Oral Once per day on Mon Thu  . furosemide  80 mg Intravenous Q8H  . loratadine  10 mg Oral Daily  . losartan  25 mg Oral QHS    . pantoprazole  20 mg Oral Daily  . potassium chloride  40 mEq Oral BID  . sodium chloride flush  3 mL Intravenous Q12H  . sodium chloride flush  3 mL Intravenous Q12H  . spironolactone  25 mg Oral Daily  . ticagrelor  60 mg Oral BID   Continuous Infusions: . sodium chloride       LOS: 4 days        05/27/17, MD [facility] Pager [phone]  If 7PM-7AM, please contact night-coverage [email or web address] Password [facility] 03/30/2017, 9:57 AM

## 2017-03-30 NOTE — Progress Notes (Signed)
Patient ID: Deborah Ho, female   DOB: 27-Apr-1951, 66 y.o.   MRN: [id number]    SUBJECTIVE: Good diuresis, weight down 7 lbs.  Patient tries to pocket pills to avoid taking.   Scheduled Meds: . atorvastatin  80 mg Oral q1800  . carvedilol  6.25 mg Oral BID WC  . enoxaparin (LOVENOX) injection  40 mg Subcutaneous Q24H  . ferrous sulfate  325 mg Oral Once per day on Mon Thu  . furosemide  80 mg Intravenous Q8H  . loratadine  10 mg Oral Daily  . losartan  25 mg Oral QHS  . pantoprazole  20 mg Oral Daily  . sodium chloride flush  3 mL Intravenous Q12H  . sodium chloride flush  3 mL Intravenous Q12H  . spironolactone  25 mg Oral Daily  . ticagrelor  60 mg Oral BID   Continuous Infusions: . sodium chloride     PRN Meds:.sodium chloride, acetaminophen **OR** acetaminophen, nitroGLYCERIN, ondansetron **OR** ondansetron (ZOFRAN) IV, sodium chloride flush    Vitals:   03/29/17 1605 03/29/17 2104 03/30/17 0313 03/30/17 0314  BP: 128/80 121/66 124/68   Pulse: 86 79 79   Resp:  20 20   Temp:  97.5 F (36.4 C) 97.6 F (36.4 C)   TempSrc:  Oral Oral   SpO2:  96% 96%   Weight:    208 lb (94.3 kg)  Height:        Intake/Output Summary (Last 24 hours) at 03/30/17 0737 Last data filed at 03/30/17 0550  Gross per 24 hour  Intake              900 ml  Output             4601 ml  Net            -3701 ml    LABS: Basic Metabolic Panel:  Recent Labs  80/99/83 0418 03/30/17 0450  NA 142 138  K 3.5 3.7  CL 100* 97*  CO2 29 30  GLUCOSE 89 103*  BUN 19 24*  CREATININE 1.25* 1.36*  CALCIUM 9.1 9.6  MG 2.0 1.9   Liver Function Tests: No results for input(s): AST, ALT, ALKPHOS, BILITOT, PROT, ALBUMIN in the last 72 hours. No results for input(s): LIPASE, AMYLASE in the last 72 hours. CBC: No results for input(s): WBC, NEUTROABS, HGB, HCT, MCV, PLT in the last 72 hours. Cardiac Enzymes: No results for input(s): CKTOTAL, CKMB, CKMBINDEX, TROPONINI in the last 72  hours. BNP: Invalid input(s): POCBNP D-Dimer: No results for input(s): DDIMER in the last 72 hours. Hemoglobin A1C: No results for input(s): HGBA1C in the last 72 hours. Fasting Lipid Panel: No results for input(s): CHOL, HDL, LDLCALC, TRIG, CHOLHDL, LDLDIRECT in the last 72 hours. Thyroid Function Tests: No results for input(s): TSH, T4TOTAL, T3FREE, THYROIDAB in the last 72 hours.  Invalid input(s): FREET3 Anemia Panel: No results for input(s): VITAMINB12, FOLATE, FERRITIN, TIBC, IRON, RETICCTPCT in the last 72 hours.  RADIOLOGY: Dg Chest 2 View  Result Date: 03/26/2017 CLINICAL DATA:  Ascites. EXAM: CHEST  2 VIEW COMPARISON:  Radiograph of February 18, 2017. FINDINGS: Stable cardiomegaly is noted. No pneumothorax is noted. Left lung is clear. Mild right pleural effusion is noted. Mild right basilar subsegmental atelectasis or infiltrate is noted. Bony thorax is unremarkable. IMPRESSION: Mild right basilar subsegmental atelectasis or infiltrate is noted with associated mild right pleural effusion. Electronically Signed   By: Lupita Raider, M.D.   On: 03/26/2017 14:39  US Abdomen Complete  Result Date: 03/27/2017 CLINICAL DATA:  Abdominal distension. EXAM: ABDOMEN ULTRASOUND COMPLETE COMPARISON:  None. FINDINGS: Gallbladder: The gallbladder has been surgically removed. Common bile duct: Diameter: 3 mm Liver: No focal lesion identified. Within normal limits in parenchymal echogenicity. IVC: No abnormality visualized. Pancreas: Poorly visualized. Visualized portions of the pancreatic head are normal. Spleen: Size and appearance within normal limits. Right Kidney: Length: 10 cm. Echogenicity within normal limits. No mass or hydronephrosis visualized. Left Kidney: Length: 10 cm. Echogenicity within normal limits. No mass or hydronephrosis visualized. Abdominal aorta: No aneurysm visualized. Other findings: None. IMPRESSION: Difficult study due to patient body habitus and shadowing bowel gas. No  acute abnormalities are identified. Electronically Signed   By: Gerome Sam III M.D   On: 03/27/2017 07:33   Dg Abd Acute W/chest  Result Date: 03/26/2017 CLINICAL DATA:  Abdominal distension. EXAM: DG ABDOMEN ACUTE W/ 1V CHEST COMPARISON:  Radiographs of June 26, 2016. FINDINGS: There is no evidence of dilated bowel loops or free intraperitoneal air. No radiopaque calculi or other significant radiographic abnormality is seen. Mild cardiomegaly is noted. Mild right basilar atelectasis or infiltrate is noted with associated pleural effusion. IMPRESSION: No evidence of bowel obstruction or ileus. Mild right basilar atelectasis or infiltrate is noted with associated pleural effusion. Electronically Signed   By: Lupita Raider, M.D.   On: 03/26/2017 17:03    PHYSICAL EXAM General: NAD Neck: JVP 12-14 cm, no thyromegaly or thyroid nodule.  Lungs: Clear to auscultation bilaterally with normal respiratory effort. CV: Nondisplaced PMI.  Heart regular S1/S2, no S3/S4, no murmur.  1+ edema to knees.   Abdomen: Soft, nontender, no hepatosplenomegaly, mild distention.  Neurologic: Alert and oriented x 3.  Psych: Normal affect. Extremities: No clubbing or cyanosis.   TELEMETRY: Personally reviewed telemetry pt in NSR  ASSESSMENT AND PLAN: 66 yo with history of CAD/ischemic cardiomyopathy, chronic systolic CHF, and poor compliance presented with acute on chronic systolic CHF.   1. Acute on chronic systolic CHF: Echo in 3/18 with EF 20-25%, moderately dilated RV with moderately decreased systolic function.  She was not taking the correct dose of torsemide at home and compliance with other meds is very questionable. She is still volume overloaded on exam but is diuresing well and losing weight.  - Continue Lasix 80 mg IV every 8 hrs, add metolazone 2.5 mg x 1 today.  - Continue spironolactone, Coreg, losartan at current doses.  She refuses to retry Entresto and I do not think she would be able to take  tid Bidil.  - Refused ICD in the past.  - Has turned down paramedicine x 3, will offer once more when she goes home.  2. CAD: s/p anterior STEMI in 2015 and acute stent thrombosis after not taking her ticagrelor.  - Allergic to aspirin, continue ticagrelor and reinforce need to take bid at home and not qd.  Does not want to go on qd Plavix as she has "lots" of ticagrelor at home.  - Continue atorvastatin.  3. CKD: Stage III, follow closely with diuresis.   [name] 03/30/2017 7:44 AM

## 2017-03-30 NOTE — Progress Notes (Signed)
Pt educated about safety and importance of bed alarm during the night however pt refuses to be on bed alarm. Will continue to round on patient.   Jaylee Freeze, RN    

## 2017-03-30 NOTE — Progress Notes (Signed)
Patient refused to take Coreg,, patient said it will make her sick and will vomit, "next time I will throw up infront of you, it will make my blood pressure too low, why not take it yourself ". ( BP 124/72). Patient laid the pill on the table. I re educate the patient again the importance of the medication. Patient did take the medication.

## 2017-03-30 NOTE — Care Management Important Message (Signed)
Important Message  Patient Details  Name: Deborah Ho MRN: [id number] Date of Birth: 03-Dec-1950   Medicare Important Message Given:  Yes    Easter Schinke [name] 03/30/2017, 12:32 PM

## 2017-03-30 NOTE — Progress Notes (Signed)
[facility]  Patient Status: Active (receiving services up to time of hospitalization)  [facility] is providing the following services: RN  If patient discharges after hours, please call [phone].   Deborah Ho 03/30/2017, 10:25 AM

## 2017-03-30 NOTE — Progress Notes (Signed)
Gave 10am medications. Patient took some of the medications but noticed that she is hiding pills in her fingers against her palms. I  Asked her to take her pills patient replied " I'm finished". I asked her to let go of her fingers and found  Brilinta and one more pill. Patient took the meds and said " I will get sick, my blood pressure is low".  Dr. Shirlee Latch  Made aware of the same incident early  this morning.

## 2017-03-30 NOTE — Progress Notes (Signed)
Refused 6pm Coreg and Atorvastatin meds.Reeducated pt but to no avail. Offered 2x still refused. Patient said "I will take it tomorrow ,tomorrow".

## 2017-03-31 DIAGNOSIS — N183 Chronic kidney disease, stage 3 (moderate): Secondary | ICD-10-CM

## 2017-03-31 LAB — CBC
HCT: 46.5 % — ABNORMAL HIGH (ref 36.0–46.0)
Hemoglobin: 14.4 g/dL (ref 12.0–15.0)
MCH: 25.9 pg — ABNORMAL LOW (ref 26.0–34.0)
MCHC: 31 g/dL (ref 30.0–36.0)
MCV: 83.5 fL (ref 78.0–100.0)
Platelets: 328 10*3/uL (ref 150–400)
RBC: 5.57 MIL/uL — ABNORMAL HIGH (ref 3.87–5.11)
RDW: 16.8 % — ABNORMAL HIGH (ref 11.5–15.5)
WBC: 7.7 10*3/uL (ref 4.0–10.5)

## 2017-03-31 LAB — BASIC METABOLIC PANEL
Anion gap: 12 (ref 5–15)
BUN: 29 mg/dL — ABNORMAL HIGH (ref 6–20)
CO2: 33 mmol/L — ABNORMAL HIGH (ref 22–32)
Calcium: 10 mg/dL (ref 8.9–10.3)
Chloride: 93 mmol/L — ABNORMAL LOW (ref 101–111)
Creatinine, Ser: 1.61 mg/dL — ABNORMAL HIGH (ref 0.44–1.00)
GFR calc Af Amer: 38 mL/min — ABNORMAL LOW (ref >60)
GFR calc non Af Amer: 33 mL/min — ABNORMAL LOW (ref >60)
Glucose, Bld: 104 mg/dL — ABNORMAL HIGH (ref 65–99)
Potassium: 4.1 mmol/L (ref 3.5–5.1)
Sodium: 138 mmol/L (ref 135–145)

## 2017-03-31 LAB — MAGNESIUM: Magnesium: 2.1 mg/dL (ref 1.7–2.4)

## 2017-03-31 LAB — HEPATIC FUNCTION PANEL
ALT: 15 U/L (ref 14–54)
AST: 26 U/L (ref 15–41)
Albumin: 3.7 g/dL (ref 3.5–5.0)
Alkaline Phosphatase: 214 U/L — ABNORMAL HIGH (ref 38–126)
Bilirubin, Direct: 0.6 mg/dL — ABNORMAL HIGH (ref 0.1–0.5)
Indirect Bilirubin: 1.2 mg/dL — ABNORMAL HIGH (ref 0.3–0.9)
Total Bilirubin: 1.8 mg/dL — ABNORMAL HIGH (ref 0.3–1.2)
Total Protein: 7.6 g/dL (ref 6.5–8.1)

## 2017-03-31 MED ORDER — METOLAZONE 2.5 MG PO TABS
2.5000 mg | ORAL_TABLET | Freq: Once | ORAL | Status: AC
  Administered 2017-03-31: 2.5 mg via ORAL
  Filled 2017-03-31: qty 1

## 2017-03-31 NOTE — Progress Notes (Signed)
Patient again refused today's dose of Coreg. " I just don't want to take it, my blood pressure is too low ( BP 126 64 HR 111). This Rn expalained again the importance of taking the medications but still does not want to take it.

## 2017-03-31 NOTE — Progress Notes (Signed)
PROGRESS NOTE                                                                                                                                                                                                             Patient Demographics:    Deborah Ho, is a 66 y.o. female, DOB - Jun 12, 1951, ELF:[id number]  Admit date - 03/26/2017   Admitting Physician Glade Lloyd, MD  Outpatient Primary MD for the patient is Grayce Sessions, NP  LOS - 5  Outpatient Specialists:Heart failure  Chief Complaint  Patient presents with  . Ascites       Brief Narrative  65 year old female with coronary artery disease, hypertension, dyslipidemia, GERD, combined systolic-CHF (last echo with EF 20-25%), lower 70 cellulitis and ulcer presenting with worsened abdominal swelling and distention past 2 weeks. She was admitted for acute on chronic combined systolic and diastolic CHF.   Subjective:   Patient reports her breathing to be improving. Weight down by almost 9 pounds.   Assessment  & Plan :    Principal Problem:   Acute Chronic systolic and diastolic CHF (congestive heart failure) (HCC) Concern for poor medication compliance. Patient was not taking current dose of torsemide at home and also concern for adherence to other home medications. -Heart failure team consulted appreciated. Continue IV Lasix every 8 hours and metolazone. Diuresing well but still quite volume overloaded (negative venous 13.6 L). -Continue strict I/O and daily weight. Counseled strongly on diet and medication adherence. -Continue Coreg, losartan, statin. Continue potassium supplement.  Active Problems: Abdominal distention Possibly secondary to volume overload. Ultrasound abdomen unremarkable. Improving.  Elevated total bilirubin and alkaline phosphatase.  Ultrasound abdomen unremarkable. Bilirubin normalized. .   Essential hypertension Stable.  Continue home medications    CKD (chronic kidney disease), stage III Around baseline. Mildly worsened this morning. Monitor with diuresis.    Hypokalemia  replenished    Code Status : Full code  Family Communication  : None at bedside  Disposition Plan  : Home possibly next 48 hours if diuresed well  Barriers For Discharge : Active symptoms  Consults  :  Heart failure  Procedures  : None  DVT Prophylaxis  :  Lovenox  Lab Results  Component Value Date   PLT 328 03/31/2017    Antibiotics  :    Anti-infectives  None        Objective:   Vitals:   03/30/17 1158 03/30/17 2152 03/31/17 0334 03/31/17 1003  BP: 110/63 122/71 116/77 120/70  Pulse: 80 79 80 87  Resp: 18 18 18    Temp: 98.3 F (36.8 C) 98.6 F (37 C) 98 F (36.7 C)   TempSrc: Oral Oral Oral   SpO2: 98% 93% 95%   Weight:   90.5 kg (199 lb 9.6 oz)   Height:        Wt Readings from Last 3 Encounters:  03/31/17 90.5 kg (199 lb 9.6 oz)  02/21/17 96 kg (211 lb 11.2 oz)  02/18/17 92.1 kg (203 lb)     Intake/Output Summary (Last 24 hours) at 03/31/17 1511 Last data filed at 03/31/17 1426  Gross per 24 hour  Intake              580 ml  Output             4800 ml  Net            -4220 ml     Physical Exam  Gen: not in distress HEENT: , moist mucosa, supple neck Chest: clear b/l, no added sounds CVS: N S1&S2, no murmurs, rubs or gallop GI: soft, mild distention, nontender, bowel sounds present Musculoskeletal: warm, 1+ pitting edema bilaterally     Data Review:    CBC  Recent Labs Lab 03/26/17 1405 03/27/17 0307 03/31/17 0458  WBC 8.5 8.0 7.7  HGB 14.0 13.4 14.4  HCT 46.0 44.0 46.5*  PLT 331 313 328  MCV 85.0 84.9 83.5  MCH 25.9* 25.9* 25.9*  MCHC 30.4 30.5 31.0  RDW 17.0* 17.4* 16.8*    Chemistries   Recent Labs Lab 03/26/17 1619 03/27/17 0307 03/28/17 0337 03/29/17 0418 03/30/17 0450 03/31/17 0458  NA  --  142 140 142 138 138  K  --  3.4* 3.4* 3.5 3.7 4.1  CL   --  107 103 100* 97* 93*  CO2  --  23 26 29 30  33*  GLUCOSE  --  85 105* 89 103* 104*  BUN  --  17 19 19  24* 29*  CREATININE  --  1.33* 1.36* 1.25* 1.36* 1.61*  CALCIUM  --  9.1 9.0 9.1 9.6 10.0  MG  --   --  1.9 2.0 1.9 2.1  AST 25 22  --   --   --  26  ALT 14 12*  --   --   --  15  ALKPHOS 199* 178*  --   --   --  214*  BILITOT 2.0* 2.3*  --   --   --  1.8*   ------------------------------------------------------------------------------------------------------------------ No results for input(s): CHOL, HDL, LDLCALC, TRIG, CHOLHDL, LDLDIRECT in the last 72 hours.  Lab Results  Component Value Date   HGBA1C 6.2 (H) 02/19/2017   ------------------------------------------------------------------------------------------------------------------ No results for input(s): TSH, T4TOTAL, T3FREE, THYROIDAB in the last 72 hours.  Invalid input(s): FREET3 ------------------------------------------------------------------------------------------------------------------ No results for input(s): VITAMINB12, FOLATE, FERRITIN, TIBC, IRON, RETICCTPCT in the last 72 hours.  Coagulation profile No results for input(s): INR, PROTIME in the last 168 hours.  No results for input(s): DDIMER in the last 72 hours.  Cardiac Enzymes No results for input(s): CKMB, TROPONINI, MYOGLOBIN in the last 168 hours.  Invalid input(s): CK ------------------------------------------------------------------------------------------------------------------    Component Value Date/Time   BNP 1,679.1 (H) 03/26/2017 1405   BNP 2,344.6 (H) 04/24/2015 1350    Inpatient Medications  Scheduled  Meds: . atorvastatin  80 mg Oral q1800  . carvedilol  6.25 mg Oral BID WC  . ferrous sulfate  325 mg Oral Once per day on Mon Thu  . furosemide  80 mg Intravenous Q8H  . loratadine  10 mg Oral Daily  . losartan  25 mg Oral QHS  . pantoprazole  20 mg Oral Daily  . potassium chloride  40 mEq Oral BID  . sodium chloride flush   3 mL Intravenous Q12H  . sodium chloride flush  3 mL Intravenous Q12H  . spironolactone  25 mg Oral Daily  . ticagrelor  60 mg Oral BID   Continuous Infusions: . sodium chloride     PRN Meds:.sodium chloride, acetaminophen **OR** acetaminophen, nitroGLYCERIN, ondansetron **OR** ondansetron (ZOFRAN) IV, sodium chloride flush  Micro Results No results found for this or any previous visit (from the past 240 hour(s)).  Radiology Reports Dg Chest 2 View  Result Date: 03/26/2017 CLINICAL DATA:  Ascites. EXAM: CHEST  2 VIEW COMPARISON:  Radiograph of February 18, 2017. FINDINGS: Stable cardiomegaly is noted. No pneumothorax is noted. Left lung is clear. Mild right pleural effusion is noted. Mild right basilar subsegmental atelectasis or infiltrate is noted. Bony thorax is unremarkable. IMPRESSION: Mild right basilar subsegmental atelectasis or infiltrate is noted with associated mild right pleural effusion. Electronically Signed   By: Lupita Raider, M.D.   On: 03/26/2017 14:39   US Abdomen Complete  Result Date: 03/27/2017 CLINICAL DATA:  Abdominal distension. EXAM: ABDOMEN ULTRASOUND COMPLETE COMPARISON:  None. FINDINGS: Gallbladder: The gallbladder has been surgically removed. Common bile duct: Diameter: 3 mm Liver: No focal lesion identified. Within normal limits in parenchymal echogenicity. IVC: No abnormality visualized. Pancreas: Poorly visualized. Visualized portions of the pancreatic head are normal. Spleen: Size and appearance within normal limits. Right Kidney: Length: 10 cm. Echogenicity within normal limits. No mass or hydronephrosis visualized. Left Kidney: Length: 10 cm. Echogenicity within normal limits. No mass or hydronephrosis visualized. Abdominal aorta: No aneurysm visualized. Other findings: None. IMPRESSION: Difficult study due to patient body habitus and shadowing bowel gas. No acute abnormalities are identified. Electronically Signed   By: Gerome Sam III M.D   On: 03/27/2017  07:33   Dg Abd Acute W/chest  Result Date: 03/26/2017 CLINICAL DATA:  Abdominal distension. EXAM: DG ABDOMEN ACUTE W/ 1V CHEST COMPARISON:  Radiographs of June 26, 2016. FINDINGS: There is no evidence of dilated bowel loops or free intraperitoneal air. No radiopaque calculi or other significant radiographic abnormality is seen. Mild cardiomegaly is noted. Mild right basilar atelectasis or infiltrate is noted with associated pleural effusion. IMPRESSION: No evidence of bowel obstruction or ileus. Mild right basilar atelectasis or infiltrate is noted with associated pleural effusion. Electronically Signed   By: Lupita Raider, M.D.   On: 03/26/2017 17:03    Time Spent in minutes  25   Eddie North M.D on 03/31/2017 at 3:11 PM  Between 7am to 7pm - Pager - [phone]  After 7pm go to [email or web address] - password [facility]  [facility] -  Office  [phone]

## 2017-03-31 NOTE — Progress Notes (Signed)
Patient ID: Deborah Ho, female   DOB: [birth year], 66 y.o.   MRN: [id number]    SUBJECTIVE:   Continue to diurese with 80 mg IV lasix every 8 hours. Brisk diuresis noted. Weight down 9 pounds.   Feeling better. Denies SOB.    Scheduled Meds: . atorvastatin  80 mg Oral q1800  . carvedilol  6.25 mg Oral BID WC  . ferrous sulfate  325 mg Oral Once per day on Mon Thu  . furosemide  80 mg Intravenous Q8H  . loratadine  10 mg Oral Daily  . losartan  25 mg Oral QHS  . pantoprazole  20 mg Oral Daily  . potassium chloride  40 mEq Oral BID  . sodium chloride flush  3 mL Intravenous Q12H  . sodium chloride flush  3 mL Intravenous Q12H  . spironolactone  25 mg Oral Daily  . ticagrelor  60 mg Oral BID   Continuous Infusions: . sodium chloride     PRN Meds:.sodium chloride, acetaminophen **OR** acetaminophen, nitroGLYCERIN, ondansetron **OR** ondansetron (ZOFRAN) IV, sodium chloride flush    Vitals:   03/30/17 1158 03/30/17 2152 03/31/17 0334 03/31/17 1003  BP: 110/63 122/71 116/77 120/70  Pulse: 80 79 80 87  Resp: 18 18 18    Temp: 98.3 F (36.8 C) 98.6 F (37 C) 98 F (36.7 C)   TempSrc: Oral Oral Oral   SpO2: 98% 93% 95%   Weight:   199 lb 9.6 oz (90.5 kg)   Height:        Intake/Output Summary (Last 24 hours) at 03/31/17 1251 Last data filed at 03/31/17 1000  Gross per 24 hour  Intake              900 ml  Output             4200 ml  Net            -3300 ml    LABS: Basic Metabolic Panel:  Recent Labs  04/02/17 0450 03/31/17 0458  NA 138 138  K 3.7 4.1  CL 97* 93*  CO2 30 33*  GLUCOSE 103* 104*  BUN 24* 29*  CREATININE 1.36* 1.61*  CALCIUM 9.6 10.0  MG 1.9 2.1   Liver Function Tests:  Recent Labs  03/31/17 0458  AST 26  ALT 15  ALKPHOS 214*  BILITOT 1.8*  PROT 7.6  ALBUMIN 3.7   No results for input(s): LIPASE, AMYLASE in the last 72 hours. CBC:  Recent Labs  03/31/17 0458  WBC 7.7  HGB 14.4  HCT 46.5*  MCV 83.5  PLT 328   Cardiac  Enzymes: No results for input(s): CKTOTAL, CKMB, CKMBINDEX, TROPONINI in the last 72 hours. BNP: Invalid input(s): POCBNP D-Dimer: No results for input(s): DDIMER in the last 72 hours. Hemoglobin A1C: No results for input(s): HGBA1C in the last 72 hours. Fasting Lipid Panel: No results for input(s): CHOL, HDL, LDLCALC, TRIG, CHOLHDL, LDLDIRECT in the last 72 hours. Thyroid Function Tests: No results for input(s): TSH, T4TOTAL, T3FREE, THYROIDAB in the last 72 hours.  Invalid input(s): FREET3 Anemia Panel: No results for input(s): VITAMINB12, FOLATE, FERRITIN, TIBC, IRON, RETICCTPCT in the last 72 hours.  RADIOLOGY: Dg Chest 2 View  Result Date: 03/26/2017 CLINICAL DATA:  Ascites. EXAM: CHEST  2 VIEW COMPARISON:  Radiograph of February 18, 2017. FINDINGS: Stable cardiomegaly is noted. No pneumothorax is noted. Left lung is clear. Mild right pleural effusion is noted. Mild right basilar subsegmental atelectasis or infiltrate is noted. Bony thorax  is unremarkable. IMPRESSION: Mild right basilar subsegmental atelectasis or infiltrate is noted with associated mild right pleural effusion. Electronically Signed   By: Lupita Raider, M.D.   On: 03/26/2017 14:39   US Abdomen Complete  Result Date: 03/27/2017 CLINICAL DATA:  Abdominal distension. EXAM: ABDOMEN ULTRASOUND COMPLETE COMPARISON:  None. FINDINGS: Gallbladder: The gallbladder has been surgically removed. Common bile duct: Diameter: 3 mm Liver: No focal lesion identified. Within normal limits in parenchymal echogenicity. IVC: No abnormality visualized. Pancreas: Poorly visualized. Visualized portions of the pancreatic head are normal. Spleen: Size and appearance within normal limits. Right Kidney: Length: 10 cm. Echogenicity within normal limits. No mass or hydronephrosis visualized. Left Kidney: Length: 10 cm. Echogenicity within normal limits. No mass or hydronephrosis visualized. Abdominal aorta: No aneurysm visualized. Other findings: None.  IMPRESSION: Difficult study due to patient body habitus and shadowing bowel gas. No acute abnormalities are identified. Electronically Signed   By: Gerome Sam III M.D   On: 03/27/2017 07:33   Dg Abd Acute W/chest  Result Date: 03/26/2017 CLINICAL DATA:  Abdominal distension. EXAM: DG ABDOMEN ACUTE W/ 1V CHEST COMPARISON:  Radiographs of June 26, 2016. FINDINGS: There is no evidence of dilated bowel loops or free intraperitoneal air. No radiopaque calculi or other significant radiographic abnormality is seen. Mild cardiomegaly is noted. Mild right basilar atelectasis or infiltrate is noted with associated pleural effusion. IMPRESSION: No evidence of bowel obstruction or ileus. Mild right basilar atelectasis or infiltrate is noted with associated pleural effusion. Electronically Signed   By: Lupita Raider, M.D.   On: 03/26/2017 17:03    PHYSICAL EXAM General: NAD sitting in the chair.  Neck: JVP ~10  no thyromegaly or thyroid nodule.  Lungs: CTAB on room air.  CV: Nondisplaced PMI.  Heart regular S1/S2, no S3/S4, no murmur.   Abdomen:obese,  Soft, nontender, no hepatosplenomegaly, distended.  Neurologic: Alert and oriented x 3.  Psych: Normal affect. Extremities: No clubbing or cyanosis. R and LLE 1-2 + edema.   TELEMETRY: NSR personally reviewed.   ASSESSMENT AND PLAN: 66 yo with history of CAD/ischemic cardiomyopathy, chronic systolic CHF, and poor compliance presented with acute on chronic systolic CHF.   1. Acute on chronic systolic CHF: Echo in 3/18 with EF 20-25%, moderately dilated RV with moderately decreased systolic function.  She was not taking the correct dose of torsemide at home and compliance with other meds is very questionable.  Volume status improving. Continue IV lasix + metolazone.   Continue spironolactone, Coreg, losartan at current doses.  She refuses to retry Entresto. Doubt she would take bidil. - Refused ICD in the past.  - Has turned down paramedicine x 3,  will offer once more when she goes home.  I will follow up with [facility] today. She will need close follow up.  2. CAD: s/p anterior STEMI in 2015 and acute stent thrombosis after not taking her ticagrelor.  - Allergic to aspirin, continue ticagrelor and reinforce need to take bid at home and not qd.  Does not want to go on qd Plavix as she has "lots" of ticagrelor at home.  - Continue atorvastatin.  No chest pain.  3. CKD: Stage III,creatinine trending up 1.3 follow closely with diuresis.   Amy Clegg NP-C  03/31/2017 12:51 PM   Patient seen with NP, agree with the above note.  Diuresing well, still has a lot of fluid.  Continue the above diuretic regimen, continue to encourage compliance with med regimen.   Candise Crabtree [employer]  03/31/2017 1:24 PM

## 2017-04-01 DIAGNOSIS — Z9114 Patient's other noncompliance with medication regimen: Secondary | ICD-10-CM

## 2017-04-01 DIAGNOSIS — Z9111 Patient's noncompliance with dietary regimen: Secondary | ICD-10-CM

## 2017-04-01 LAB — BASIC METABOLIC PANEL
Anion gap: 14 (ref 5–15)
BUN: 33 mg/dL — ABNORMAL HIGH (ref 6–20)
CO2: 32 mmol/L (ref 22–32)
Calcium: 10 mg/dL (ref 8.9–10.3)
Chloride: 91 mmol/L — ABNORMAL LOW (ref 101–111)
Creatinine, Ser: 1.52 mg/dL — ABNORMAL HIGH (ref 0.44–1.00)
GFR calc Af Amer: 40 mL/min — ABNORMAL LOW (ref >60)
GFR calc non Af Amer: 35 mL/min — ABNORMAL LOW (ref >60)
Glucose, Bld: 103 mg/dL — ABNORMAL HIGH (ref 65–99)
Potassium: 4.3 mmol/L (ref 3.5–5.1)
Sodium: 137 mmol/L (ref 135–145)

## 2017-04-01 MED ORDER — METOLAZONE 2.5 MG PO TABS
2.5000 mg | ORAL_TABLET | Freq: Once | ORAL | Status: AC
Start: 2017-04-01 — End: 2017-04-01
  Administered 2017-04-01: 2.5 mg via ORAL
  Filled 2017-04-01: qty 1

## 2017-04-01 NOTE — Progress Notes (Signed)
I stopped to see Deborah Ho.  She was asking me about fluid intake and her Lasix or Demadex.  She says that she thinks she needs to drink more water when she takes those medications.  I again explained the purpose of these medications and the fact that she must be very careful to LIMIT fluid intake at home.  She remains with very poor insight despite multiple attempts to explain her disease and its process as well as HF recommendations for home.  She is agreeable at this time to HF [employer].  We will attempt to engage her after discharge once again.

## 2017-04-01 NOTE — Progress Notes (Signed)
PROGRESS NOTE                                                                                                                                                                                                             Patient Demographics:    Deborah Ho, is a 66 y.o. female, DOB - 04-03-1951, MMH:[id number]  Admit date - 03/26/2017   Admitting Physician Glade Lloyd, MD  Outpatient Primary MD for the patient is Grayce Sessions, NP  LOS - 6  Outpatient Specialists:Heart failure  Chief Complaint  Patient presents with  . Ascites       Brief Narrative  66 year old female with coronary artery disease, hypertension, dyslipidemia, GERD, combined systolic-CHF (last echo with EF 20-25%), lower 70 cellulitis and ulcer presenting with worsened abdominal swelling and distention past 2 weeks. She was admitted for acute on chronic combined systolic and diastolic CHF.   Subjective:   Patient reports her breathing to be improving. Weight down by 14 lbs .   Assessment  & Plan :    Principal Problem: Acute on chronic systolic and diastolic CHF (HCC) Secondary to poor medication compliance. She was not taking her recommended dose of torsemide at home and also concern for adherence to other home medications. Continue IV Lasix and metolazone. Diuresing well. Reportedly refused her ARB and carvedilol yesterday. Reinforced on being adherent to her medications. Continue statin and potassium supplement.   Active Problems: Abdominal distention Secondary to fluid overload. Abdominal ultrasound unremarkable. Improved.  Elevated total bilirubin and alkaline phosphatase. No clear Etiology. Ultrasound abdomen unremarkable. Total bilirubin normalized. . Essential hypertension Continue home medications.   Chronic kidney disease stage III At baseline.    Hypokalemia  replenished    Code Status : Full code  Family  Communication  : None at bedside  Disposition Plan  : Home  In  1-2 days if diuresing well  Barriers For Discharge : Active symptoms  Consults  :  Heart failure  Procedures  : None  DVT Prophylaxis  :  Lovenox  Lab Results  Component Value Date   PLT 328 03/31/2017    Antibiotics  :    Anti-infectives    None        Objective:   Vitals:   03/31/17 1730 03/31/17 2111 04/01/17 0441 04/01/17 1132  BP: 126/64 118/70 125/78 115/77  Pulse: (!) 111 85 86 89  Resp:  20 18 18   Temp:  98.2 F (36.8 C) 97.7 F (36.5 C)   TempSrc:  Oral Oral   SpO2:  93% 95% 93%  Weight:   88.4 kg (194 lb 12.8 oz)   Height:        Wt Readings from Last 3 Encounters:  04/01/17 88.4 kg (194 lb 12.8 oz)  02/21/17 96 kg (211 lb 11.2 oz)  02/18/17 92.1 kg (203 lb)     Intake/Output Summary (Last 24 hours) at 04/01/17 1300 Last data filed at 04/01/17 1135  Gross per 24 hour  Intake              363 ml  Output             4800 ml  Net            -4437 ml     Physical Exam Gen.: Elderly female not in distress HEENT: Moist mucosa, supple neck Chest: Clear bilaterally CVS: Normal S1 and S2, no murmurs GI: Soft, mild abdominal distention, nontender, Musculoskeletal: 1+ pitting edema bilaterally      Data Review:    CBC  Recent Labs Lab 03/26/17 1405 03/27/17 0307 03/31/17 0458  WBC 8.5 8.0 7.7  HGB 14.0 13.4 14.4  HCT 46.0 44.0 46.5*  PLT 331 313 328  MCV 85.0 84.9 83.5  MCH 25.9* 25.9* 25.9*  MCHC 30.4 30.5 31.0  RDW 17.0* 17.4* 16.8*    Chemistries   Recent Labs Lab 03/26/17 1619 03/27/17 0307 03/28/17 0337 03/29/17 0418 03/30/17 0450 03/31/17 0458 04/01/17 0355  NA  --  142 140 142 138 138 137  K  --  3.4* 3.4* 3.5 3.7 4.1 4.3  CL  --  107 103 100* 97* 93* 91*  CO2  --  23 26 29 30  33* 32  GLUCOSE  --  85 105* 89 103* 104* 103*  BUN  --  17 19 19  24* 29* 33*  CREATININE  --  1.33* 1.36* 1.25* 1.36* 1.61* 1.52*  CALCIUM  --  9.1 9.0 9.1 9.6 10.0 10.0   MG  --   --  1.9 2.0 1.9 2.1  --   AST 25 22  --   --   --  26  --   ALT 14 12*  --   --   --  15  --   ALKPHOS 199* 178*  --   --   --  214*  --   BILITOT 2.0* 2.3*  --   --   --  1.8*  --    ------------------------------------------------------------------------------------------------------------------ No results for input(s): CHOL, HDL, LDLCALC, TRIG, CHOLHDL, LDLDIRECT in the last 72 hours.  Lab Results  Component Value Date   HGBA1C 6.2 (H) 02/19/2017   ------------------------------------------------------------------------------------------------------------------ No results for input(s): TSH, T4TOTAL, T3FREE, THYROIDAB in the last 72 hours.  Invalid input(s): FREET3 ------------------------------------------------------------------------------------------------------------------ No results for input(s): VITAMINB12, FOLATE, FERRITIN, TIBC, IRON, RETICCTPCT in the last 72 hours.  Coagulation profile No results for input(s): INR, PROTIME in the last 168 hours.  No results for input(s): DDIMER in the last 72 hours.  Cardiac Enzymes No results for input(s): CKMB, TROPONINI, MYOGLOBIN in the last 168 hours.  Invalid input(s): CK ------------------------------------------------------------------------------------------------------------------    Component Value Date/Time   BNP 1,679.1 (H) 03/26/2017 1405   BNP 2,344.6 (H) 04/24/2015 1350    Inpatient Medications  Scheduled Meds: . atorvastatin  80 mg Oral q1800  . carvedilol  6.25 mg Oral BID WC  . ferrous sulfate  325 mg Oral Once per day on Mon Thu  . furosemide  80 mg Intravenous Q8H  . loratadine  10 mg Oral Daily  . losartan  25 mg Oral QHS  . pantoprazole  20 mg Oral Daily  . potassium chloride  40 mEq Oral BID  . sodium chloride flush  3 mL Intravenous Q12H  . sodium chloride flush  3 mL Intravenous Q12H  . spironolactone  25 mg Oral Daily  . ticagrelor  60 mg Oral BID   Continuous Infusions: . sodium  chloride     PRN Meds:.sodium chloride, acetaminophen **OR** acetaminophen, nitroGLYCERIN, ondansetron **OR** ondansetron (ZOFRAN) IV, sodium chloride flush  Micro Results No results found for this or any previous visit (from the past 240 hour(s)).  Radiology Reports Dg Chest 2 View  Result Date: 03/26/2017 CLINICAL DATA:  Ascites. EXAM: CHEST  2 VIEW COMPARISON:  Radiograph of February 18, 2017. FINDINGS: Stable cardiomegaly is noted. No pneumothorax is noted. Left lung is clear. Mild right pleural effusion is noted. Mild right basilar subsegmental atelectasis or infiltrate is noted. Bony thorax is unremarkable. IMPRESSION: Mild right basilar subsegmental atelectasis or infiltrate is noted with associated mild right pleural effusion. Electronically Signed   By: Lupita Raider, M.D.   On: 03/26/2017 14:39   US Abdomen Complete  Result Date: 03/27/2017 CLINICAL DATA:  Abdominal distension. EXAM: ABDOMEN ULTRASOUND COMPLETE COMPARISON:  None. FINDINGS: Gallbladder: The gallbladder has been surgically removed. Common bile duct: Diameter: 3 mm Liver: No focal lesion identified. Within normal limits in parenchymal echogenicity. IVC: No abnormality visualized. Pancreas: Poorly visualized. Visualized portions of the pancreatic head are normal. Spleen: Size and appearance within normal limits. Right Kidney: Length: 10 cm. Echogenicity within normal limits. No mass or hydronephrosis visualized. Left Kidney: Length: 10 cm. Echogenicity within normal limits. No mass or hydronephrosis visualized. Abdominal aorta: No aneurysm visualized. Other findings: None. IMPRESSION: Difficult study due to patient body habitus and shadowing bowel gas. No acute abnormalities are identified. Electronically Signed   By: Gerome Sam III M.D   On: 03/27/2017 07:33   Dg Abd Acute W/chest  Result Date: 03/26/2017 CLINICAL DATA:  Abdominal distension. EXAM: DG ABDOMEN ACUTE W/ 1V CHEST COMPARISON:  Radiographs of June 26, 2016.  FINDINGS: There is no evidence of dilated bowel loops or free intraperitoneal air. No radiopaque calculi or other significant radiographic abnormality is seen. Mild cardiomegaly is noted. Mild right basilar atelectasis or infiltrate is noted with associated pleural effusion. IMPRESSION: No evidence of bowel obstruction or ileus. Mild right basilar atelectasis or infiltrate is noted with associated pleural effusion. Electronically Signed   By: Lupita Raider, M.D.   On: 03/26/2017 17:03    Time Spent in minutes  25   Eddie North M.D on 04/01/2017 at 1:00 PM  Between 7am to 7pm - Pager - [phone]  After 7pm go to [email or web address] - password [facility]  [facility] -  Office  [phone]

## 2017-04-01 NOTE — Progress Notes (Signed)
Patient ID: Deborah Ho, female   DOB: Jun 07, 1951, 66 y.o.   MRN: [id number]    SUBJECTIVE:   Yesterday she refused coreg and losartan. Continues to diurese with IV lasix. Weight down 5 pounds.   Denies SOB/Orthopnea.  Creatinine coming down 1.6>1.5.   Scheduled Meds: . atorvastatin  80 mg Oral q1800  . carvedilol  6.25 mg Oral BID WC  . ferrous sulfate  325 mg Oral Once per day on Mon Thu  . furosemide  80 mg Intravenous Q8H  . loratadine  10 mg Oral Daily  . losartan  25 mg Oral QHS  . pantoprazole  20 mg Oral Daily  . potassium chloride  40 mEq Oral BID  . sodium chloride flush  3 mL Intravenous Q12H  . sodium chloride flush  3 mL Intravenous Q12H  . spironolactone  25 mg Oral Daily  . ticagrelor  60 mg Oral BID   Continuous Infusions: . sodium chloride     PRN Meds:.sodium chloride, acetaminophen **OR** acetaminophen, nitroGLYCERIN, ondansetron **OR** ondansetron (ZOFRAN) IV, sodium chloride flush    Vitals:   03/31/17 1003 03/31/17 1730 03/31/17 2111 04/01/17 0441  BP: 120/70 126/64 118/70 125/78  Pulse: 87 (!) 111 85 86  Resp:   20 18  Temp:   98.2 F (36.8 C) 97.7 F (36.5 C)  TempSrc:   Oral Oral  SpO2:   93% 95%  Weight:    194 lb 12.8 oz (88.4 kg)  Height:        Intake/Output Summary (Last 24 hours) at 04/01/17 0835 Last data filed at 04/01/17 0448  Gross per 24 hour  Intake              580 ml  Output             3400 ml  Net            -2820 ml    LABS: Basic Metabolic Panel:  Recent Labs  87/56/43 0450 03/31/17 0458 04/01/17 0355  NA 138 138 137  K 3.7 4.1 4.3  CL 97* 93* 91*  CO2 30 33* 32  GLUCOSE 103* 104* 103*  BUN 24* 29* 33*  CREATININE 1.36* 1.61* 1.52*  CALCIUM 9.6 10.0 10.0  MG 1.9 2.1  --    Liver Function Tests:  Recent Labs  03/31/17 0458  AST 26  ALT 15  ALKPHOS 214*  BILITOT 1.8*  PROT 7.6  ALBUMIN 3.7   No results for input(s): LIPASE, AMYLASE in the last 72 hours. CBC:  Recent Labs  03/31/17 0458    WBC 7.7  HGB 14.4  HCT 46.5*  MCV 83.5  PLT 328   Cardiac Enzymes: No results for input(s): CKTOTAL, CKMB, CKMBINDEX, TROPONINI in the last 72 hours. BNP: Invalid input(s): POCBNP D-Dimer: No results for input(s): DDIMER in the last 72 hours. Hemoglobin A1C: No results for input(s): HGBA1C in the last 72 hours. Fasting Lipid Panel: No results for input(s): CHOL, HDL, LDLCALC, TRIG, CHOLHDL, LDLDIRECT in the last 72 hours. Thyroid Function Tests: No results for input(s): TSH, T4TOTAL, T3FREE, THYROIDAB in the last 72 hours.  Invalid input(s): FREET3 Anemia Panel: No results for input(s): VITAMINB12, FOLATE, FERRITIN, TIBC, IRON, RETICCTPCT in the last 72 hours.  RADIOLOGY: Dg Chest 2 View  Result Date: 03/26/2017 CLINICAL DATA:  Ascites. EXAM: CHEST  2 VIEW COMPARISON:  Radiograph of February 18, 2017. FINDINGS: Stable cardiomegaly is noted. No pneumothorax is noted. Left lung is clear. Mild right pleural effusion is  noted. Mild right basilar subsegmental atelectasis or infiltrate is noted. Bony thorax is unremarkable. IMPRESSION: Mild right basilar subsegmental atelectasis or infiltrate is noted with associated mild right pleural effusion. Electronically Signed   By: Lupita Raider, M.D.   On: 03/26/2017 14:39   US Abdomen Complete  Result Date: 03/27/2017 CLINICAL DATA:  Abdominal distension. EXAM: ABDOMEN ULTRASOUND COMPLETE COMPARISON:  None. FINDINGS: Gallbladder: The gallbladder has been surgically removed. Common bile duct: Diameter: 3 mm Liver: No focal lesion identified. Within normal limits in parenchymal echogenicity. IVC: No abnormality visualized. Pancreas: Poorly visualized. Visualized portions of the pancreatic head are normal. Spleen: Size and appearance within normal limits. Right Kidney: Length: 10 cm. Echogenicity within normal limits. No mass or hydronephrosis visualized. Left Kidney: Length: 10 cm. Echogenicity within normal limits. No mass or hydronephrosis  visualized. Abdominal aorta: No aneurysm visualized. Other findings: None. IMPRESSION: Difficult study due to patient body habitus and shadowing bowel gas. No acute abnormalities are identified. Electronically Signed   By: Gerome Sam III M.D   On: 03/27/2017 07:33   Dg Abd Acute W/chest  Result Date: 03/26/2017 CLINICAL DATA:  Abdominal distension. EXAM: DG ABDOMEN ACUTE W/ 1V CHEST COMPARISON:  Radiographs of June 26, 2016. FINDINGS: There is no evidence of dilated bowel loops or free intraperitoneal air. No radiopaque calculi or other significant radiographic abnormality is seen. Mild cardiomegaly is noted. Mild right basilar atelectasis or infiltrate is noted with associated pleural effusion. IMPRESSION: No evidence of bowel obstruction or ileus. Mild right basilar atelectasis or infiltrate is noted with associated pleural effusion. Electronically Signed   By: Lupita Raider, M.D.   On: 03/26/2017 17:03    PHYSICAL EXAM General:  Well appearing. No resp difficulty. Walking in the room  HEENT: normal Neck: supple. JVP ~10. Carotids 2+ bilat; no bruits. No lymphadenopathy or thryomegaly appreciated. Cor: PMI nondisplaced. Regular rate & rhythm. No rubs, gallops or murmurs. Lungs: clear Abdomen: soft, nontender,distended. No hepatosplenomegaly. No bruits or masses. Good bowel sounds. Extremities: no cyanosis, clubbing, rash, R and LLE 2+ edema Neuro: alert & orientedx3, cranial nerves grossly intact. moves all 4 extremities w/o difficulty. Affect pleasant   TELEMETRY: NSR personally reviewed.    ASSESSMENT AND PLAN: 66 yo with history of CAD/ischemic cardiomyopathy, chronic systolic CHF, and poor compliance presented with acute on chronic systolic CHF.   1. Acute on chronic systolic CHF: Echo in 3/18 with EF 20-25%, moderately dilated RV with moderately decreased systolic function.  She was not taking the correct dose of torsemide at home and compliance with other meds is very  questionable.  Volume status continues to improved. Continue IV lasix + metolazone.    Continue spironolactone, Coreg, losartan at current doses.  She refuses to retry Entresto. Doubt she would take bidil. - Refused ICD in the past.  - Has turned down paramedicine x 3, will offer once more when she goes home.  I will follow up with [facility] today. She will need close follow up.  2. CAD: s/p anterior STEMI in 2015 and acute stent thrombosis after not taking her ticagrelor.  - Allergic to aspirin, continue ticagrelor and reinforce need to take bid at home and not qd.  Does not want to go on qd Plavix as she has "lots" of ticagrelor at home.  - Continue atorvastatin.  No chest pain.  3. CKD: Stage III,creatinine trending up 1.3 follow closely with diuresis.   I stressed the importance of medication compliance.   Deborah Clegg NP-C  04/01/2017 8:35 AM   Patient seen with NP, agree with the above note.  She continues to diurese well with fairly stable creatinine.  Still some volume overload on exam, suspect she will need 1-2 more days of IV diuresis.  Continue same regimen today.  She refused Coreg and losartan yesterday.  Long discussion with her again about why she is on these medications and she agrees to take today.  I am not optimistic about her compliance with her medication regimen when she goes home.   Deborah Ho 04/01/2017 9:06 AM

## 2017-04-02 DIAGNOSIS — Z9114 Patient's other noncompliance with medication regimen: Secondary | ICD-10-CM

## 2017-04-02 DIAGNOSIS — R140 Abdominal distension (gaseous): Secondary | ICD-10-CM

## 2017-04-02 DIAGNOSIS — I5043 Acute on chronic combined systolic (congestive) and diastolic (congestive) heart failure: Secondary | ICD-10-CM | POA: Diagnosis present

## 2017-04-02 DIAGNOSIS — R601 Generalized edema: Secondary | ICD-10-CM

## 2017-04-02 LAB — BASIC METABOLIC PANEL
Anion gap: 13 (ref 5–15)
BUN: 43 mg/dL — ABNORMAL HIGH (ref 6–20)
CO2: 30 mmol/L (ref 22–32)
Calcium: 10.1 mg/dL (ref 8.9–10.3)
Chloride: 93 mmol/L — ABNORMAL LOW (ref 101–111)
Creatinine, Ser: 1.71 mg/dL — ABNORMAL HIGH (ref 0.44–1.00)
GFR calc Af Amer: 35 mL/min — ABNORMAL LOW (ref >60)
GFR calc non Af Amer: 30 mL/min — ABNORMAL LOW (ref >60)
Glucose, Bld: 106 mg/dL — ABNORMAL HIGH (ref 65–99)
Potassium: 4.2 mmol/L (ref 3.5–5.1)
Sodium: 136 mmol/L (ref 135–145)

## 2017-04-02 MED ORDER — TICAGRELOR 60 MG PO TABS: 60.0000 mg | ORAL_TABLET | Freq: Two times a day (BID) | ORAL | 0 refills | Status: DC

## 2017-04-02 MED ORDER — POTASSIUM CHLORIDE CRYS ER 20 MEQ PO TBCR: 20.0000 meq | EXTENDED_RELEASE_TABLET | Freq: Every day | ORAL | 0 refills | Status: DC

## 2017-04-02 MED ORDER — TORSEMIDE 20 MG PO TABS: 60.0000 mg | ORAL_TABLET | Freq: Every day | ORAL | 0 refills | Status: DC

## 2017-04-02 MED ORDER — LOSARTAN POTASSIUM 25 MG PO TABS: 25.0000 mg | ORAL_TABLET | Freq: Every day | ORAL | 0 refills | Status: DC

## 2017-04-02 MED ORDER — POTASSIUM CHLORIDE CRYS ER 20 MEQ PO TBCR
20.0000 meq | EXTENDED_RELEASE_TABLET | Freq: Every day | ORAL | Status: DC
  Administered 2017-04-02: 20 meq via ORAL
  Filled 2017-04-02: qty 1

## 2017-04-02 MED ORDER — TORSEMIDE 20 MG PO TABS
60.0000 mg | ORAL_TABLET | Freq: Every day | ORAL | Status: DC
  Filled 2017-04-02: qty 3

## 2017-04-02 NOTE — Progress Notes (Signed)
Pt slept well during the night, Vitals stable, no any sign of SOB and distress noted, pt has a complain of muscle cramps, scheduled K provided and other food supplement, will continue to monitor the patient.

## 2017-04-02 NOTE — Progress Notes (Signed)
Patient ID: Deborah Ho, female   DOB: 1951/06/27, 66 y.o.   MRN: [id number]    SUBJECTIVE:   She took all her meds yesterday.  Weight down 3 lbs.  No dyspnea.   Denies SOB/Orthopnea.  Creatinine 1.6>1.5>1.7.   Scheduled Meds: . atorvastatin  80 mg Oral q1800  . carvedilol  6.25 mg Oral BID WC  . ferrous sulfate  325 mg Oral Once per day on Mon Thu  . loratadine  10 mg Oral Daily  . losartan  25 mg Oral QHS  . pantoprazole  20 mg Oral Daily  . potassium chloride  20 mEq Oral Daily  . sodium chloride flush  3 mL Intravenous Q12H  . sodium chloride flush  3 mL Intravenous Q12H  . spironolactone  25 mg Oral Daily  . ticagrelor  60 mg Oral BID  . [START ON 04/03/2017] torsemide  60 mg Oral Daily   Continuous Infusions: . sodium chloride     PRN Meds:.sodium chloride, acetaminophen **OR** acetaminophen, nitroGLYCERIN, ondansetron **OR** ondansetron (ZOFRAN) IV, sodium chloride flush    Vitals:   04/01/17 2000 04/01/17 2136 04/02/17 0404 04/02/17 0625  BP: 124/71 123/65  130/61  Pulse: 91 92  87  Resp: 18 18    Temp: 98 F (36.7 C)   97 F (36.1 C)  TempSrc: Oral   Oral  SpO2: 98% 98%  99%  Weight:   191 lb 1.6 oz (86.7 kg)   Height:        Intake/Output Summary (Last 24 hours) at 04/02/17 0813 Last data filed at 04/02/17 0523  Gross per 24 hour  Intake              723 ml  Output             3500 ml  Net            -2777 ml    LABS: Basic Metabolic Panel:  Recent Labs  01/75/10 0458 04/01/17 0355 04/02/17 0524  NA 138 137 136  K 4.1 4.3 4.2  CL 93* 91* 93*  CO2 33* 32 30  GLUCOSE 104* 103* 106*  BUN 29* 33* 43*  CREATININE 1.61* 1.52* 1.71*  CALCIUM 10.0 10.0 10.1  MG 2.1  --   --    Liver Function Tests:  Recent Labs  03/31/17 0458  AST 26  ALT 15  ALKPHOS 214*  BILITOT 1.8*  PROT 7.6  ALBUMIN 3.7   No results for input(s): LIPASE, AMYLASE in the last 72 hours. CBC:  Recent Labs  03/31/17 0458  WBC 7.7  HGB 14.4  HCT 46.5*  MCV  83.5  PLT 328   Cardiac Enzymes: No results for input(s): CKTOTAL, CKMB, CKMBINDEX, TROPONINI in the last 72 hours. BNP: Invalid input(s): POCBNP D-Dimer: No results for input(s): DDIMER in the last 72 hours. Hemoglobin A1C: No results for input(s): HGBA1C in the last 72 hours. Fasting Lipid Panel: No results for input(s): CHOL, HDL, LDLCALC, TRIG, CHOLHDL, LDLDIRECT in the last 72 hours. Thyroid Function Tests: No results for input(s): TSH, T4TOTAL, T3FREE, THYROIDAB in the last 72 hours.  Invalid input(s): FREET3 Anemia Panel: No results for input(s): VITAMINB12, FOLATE, FERRITIN, TIBC, IRON, RETICCTPCT in the last 72 hours.  RADIOLOGY: Dg Chest 2 View  Result Date: 03/26/2017 CLINICAL DATA:  Ascites. EXAM: CHEST  2 VIEW COMPARISON:  Radiograph of February 18, 2017. FINDINGS: Stable cardiomegaly is noted. No pneumothorax is noted. Left lung is clear. Mild right pleural effusion is noted.  Mild right basilar subsegmental atelectasis or infiltrate is noted. Bony thorax is unremarkable. IMPRESSION: Mild right basilar subsegmental atelectasis or infiltrate is noted with associated mild right pleural effusion. Electronically Signed   By: Lupita Raider, M.D.   On: 03/26/2017 14:39   US Abdomen Complete  Result Date: 03/27/2017 CLINICAL DATA:  Abdominal distension. EXAM: ABDOMEN ULTRASOUND COMPLETE COMPARISON:  None. FINDINGS: Gallbladder: The gallbladder has been surgically removed. Common bile duct: Diameter: 3 mm Liver: No focal lesion identified. Within normal limits in parenchymal echogenicity. IVC: No abnormality visualized. Pancreas: Poorly visualized. Visualized portions of the pancreatic head are normal. Spleen: Size and appearance within normal limits. Right Kidney: Length: 10 cm. Echogenicity within normal limits. No mass or hydronephrosis visualized. Left Kidney: Length: 10 cm. Echogenicity within normal limits. No mass or hydronephrosis visualized. Abdominal aorta: No aneurysm  visualized. Other findings: None. IMPRESSION: Difficult study due to patient body habitus and shadowing bowel gas. No acute abnormalities are identified. Electronically Signed   By: Gerome Sam III M.D   On: 03/27/2017 07:33   Dg Abd Acute W/chest  Result Date: 03/26/2017 CLINICAL DATA:  Abdominal distension. EXAM: DG ABDOMEN ACUTE W/ 1V CHEST COMPARISON:  Radiographs of June 26, 2016. FINDINGS: There is no evidence of dilated bowel loops or free intraperitoneal air. No radiopaque calculi or other significant radiographic abnormality is seen. Mild cardiomegaly is noted. Mild right basilar atelectasis or infiltrate is noted with associated pleural effusion. IMPRESSION: No evidence of bowel obstruction or ileus. Mild right basilar atelectasis or infiltrate is noted with associated pleural effusion. Electronically Signed   By: Lupita Raider, M.D.   On: 03/26/2017 17:03    PHYSICAL EXAM General:  NAD HEENT: normal Neck: supple. JVP 7-8. Carotids 2+ bilat; no bruits. No lymphadenopathy or thryomegaly appreciated. Cor: PMI nondisplaced. Regular rate & rhythm. No rubs, gallops or murmurs. Lungs: clear Abdomen: soft, nontender,distended. No hepatosplenomegaly. No bruits or masses. Good bowel sounds. Extremities: no cyanosis, clubbing, rash.  Trace ankle edema.  Neuro: alert & orientedx3, cranial nerves grossly intact. moves all 4 extremities w/o difficulty. Affect pleasant   TELEMETRY: NSR personally reviewed.    ASSESSMENT AND PLAN: 66 yo with history of CAD/ischemic cardiomyopathy, chronic systolic CHF, and poor compliance presented with acute on chronic systolic CHF.   1. Acute on chronic systolic CHF: Echo in 3/18 with EF 20-25%, moderately dilated RV with moderately decreased systolic function.  She was not taking the correct dose of torsemide at home and compliance with other meds is very questionable.  Volume status has improved with diuresis, weight down considerably and now appears  near-euvolemic.  Creatinine up today. - Stop IV Lasix (got 1 dose today).  Start torsemide 60 mg daily tomorrow.  - Continue spironolactone, Coreg, losartan at current doses.  She refuses to retry Entresto. Doubt she would take bidil. We have talked at length about medication compliance.  - Refused ICD in the past.  - Has turned down paramedicine x 3, will try one more time when she goes home from this admission.  2. CAD: s/p anterior STEMI in 2015 and acute stent thrombosis after not taking her ticagrelor.  - Allergic to aspirin, continue ticagrelor and reinforce need to take bid at home and not qd.  Does not want to go on qd Plavix as she has "lots" of ticagrelor at home.  - Continue atorvastatin.  3. CKD: Stage III, mild uptrend with diuresis.  Stop IV Lasix today.  4. Disposition: May go home today  from my standpoint.  She will need close followup in CHF clinic.  Would like her to be in the paramedicine program.  Will need to have her meds discussed with her again before discharge.  Cardiac meds for home: Torsemide 60 mg daily, KCl 20 daily, ticagrelor 60 mg bid, spironolactone 25 daily, losartan 25 daily, Coreg 6.25 mg bid, atorvastatin 80 mg daily.     [name] 04/02/2017 8:13 AM

## 2017-04-02 NOTE — Progress Notes (Signed)
Patient has HHRN as prior to admission with [facility]; private insurance with Medicare; [name]

## 2017-04-02 NOTE — Care Management Important Message (Signed)
Important Message  Patient Details  Name: Deborah Ho MRN: [id number] Date of Birth: 1951-04-24   Medicare Important Message Given:  Yes    Kyla Balzarine 04/02/2017, 3:17 PM

## 2017-04-02 NOTE — Progress Notes (Signed)
Orders received for pt discharge.  Discharge summary printed and reviewed with pt.  Explained medication regimen, and pt had no further questions at this time.  IV removed and site remains clean, dry, intact.  Telemetry removed.  Pt in stable condition and awaiting transport. 

## 2017-04-02 NOTE — Discharge Instructions (Signed)
Heart Failure °Heart failure is a condition in which the heart has trouble pumping blood because it has become weak or stiff. This means that the heart does not pump blood efficiently for the body to work well. For some people with heart failure, fluid may back up into the lungs and there may be swelling (edema) in the lower legs. Heart failure is usually a long-term (chronic) condition. It is important for you to take good care of yourself and follow the treatment plan from your health care provider. °What are the causes? °This condition is caused by some health problems, including: °· High blood pressure (hypertension). Hypertension causes the heart muscle to work harder than normal. High blood pressure eventually causes the heart to become stiff and weak. °· Coronary artery disease (CAD). CAD is the buildup of cholesterol and fat (plaques) in the arteries of the heart. °· Heart attack (myocardial infarction). Injured tissue, which is caused by the heart attack, does not contract as well and the heart's ability to pump blood is weakened. °· Abnormal heart valves. When the heart valves do not open and close properly, the heart muscle must pump harder to keep the blood flowing. °· Heart muscle disease (cardiomyopathy or myocarditis). Heart muscle disease is damage to the heart muscle from a variety of causes, such as drug or alcohol abuse, infections, or unknown causes. These can increase the risk of heart failure. °· Lung disease. When the lungs do not work properly, the heart must work harder. ° °What increases the risk? °Risk of heart failure increases as a person ages. This condition is also more likely to develop in people who: °· Are overweight. °· Are female. °· Smoke or chew tobacco. °· Abuse alcohol or illegal drugs. °· Have taken medicines that can damage the heart, such as chemotherapy drugs. °· Have diabetes. °? High blood sugar (glucose) is associated with high fat (lipid) levels in the blood. °? Diabetes  can also damage tiny blood vessels that carry nutrients to the heart muscle. °· Have abnormal heart rhythms. °· Have thyroid problems. °· Have low blood counts (anemia). ° °What are the signs or symptoms? °Symptoms of this condition include: °· Shortness of breath with activity, such as when climbing stairs. °· Persistent cough. °· Swelling of the feet, ankles, legs, or abdomen. °· Unexplained weight gain. °· Difficulty breathing when lying flat (orthopnea). °· Waking from sleep because of the need to sit up and get more air. °· Rapid heartbeat. °· Fatigue and loss of energy. °· Feeling light-headed, dizzy, or close to fainting. °· Loss of appetite. °· Nausea. °· Increased urination during the night (nocturia). °· Confusion. ° °How is this diagnosed? °This condition is diagnosed based on: °· Medical history, symptoms, and a physical exam. °· Diagnostic tests, which may include: °? Echocardiogram. °? Electrocardiogram (ECG). °? Chest X-ray. °? Blood tests. °? Exercise stress test. °? Radionuclide scans. °? Cardiac catheterization and angiogram. ° °How is this treated? °Treatment for this condition is aimed at managing the symptoms of heart failure. Medicines, behavioral changes, or other treatments may be necessary to treat heart failure. °Medicines °These may include: °· Angiotensin-converting enzyme (ACE) inhibitors. This type of medicine blocks the effects of a blood protein called angiotensin-converting enzyme. ACE inhibitors relax (dilate) the blood vessels and help to lower blood pressure. °· Angiotensin receptor blockers (ARBs). This type of medicine blocks the actions of a blood protein called angiotensin. ARBs dilate the blood vessels and help to lower blood pressure. °· Water   pills (diuretics). Diuretics cause the kidneys to remove salt and water from the blood. The extra fluid is removed through urination, leaving a lower volume of blood that the heart has to pump. °· Beta blockers. These improve heart  muscle strength and they prevent the heart from beating too quickly. °· Digoxin. This increases the force of the heartbeat. ° °Healthy behavior changes °These may include: °· Reaching and maintaining a healthy weight. °· Stopping smoking or chewing tobacco. °· Eating heart-healthy foods. °· Limiting or avoiding alcohol. °· Stopping use of street drugs (illegal drugs). °· Physical activity. ° °Other treatments °These may include: °· Surgery to open blocked coronary arteries or repair damaged heart valves. °· Placement of a biventricular pacemaker to improve heart muscle function (cardiac resynchronization therapy). This device paces both the right ventricle and left ventricle. °· Placement of a device to treat serious abnormal heart rhythms (implantable cardioverter defibrillator, or ICD). °· Placement of a device to improve the pumping ability of the heart (left ventricular assist device, or LVAD). °· Heart transplant. This can cure heart failure, and it is considered for certain patients who do not improve with other therapies. ° °Follow these instructions at home: °Medicines °· Take over-the-counter and prescription medicines only as told by your health care provider. Medicines are important in reducing the workload of your heart, slowing the progression of heart failure, and improving your symptoms. °? Do not stop taking your medicine unless your health care provider told you to do that. °? Do not skip any dose of medicine. °? Refill your prescriptions before you run out of medicine. You need your medicines every day. °Eating and drinking ° °· Eat heart-healthy foods. Talk with a dietitian to make an eating plan that is right for you. °? Choose foods that contain no trans fat and are low in saturated fat and cholesterol. Healthy choices include fresh or frozen fruits and vegetables, fish, lean meats, legumes, fat-free or low-fat dairy products, and whole-grain or high-fiber foods. °? Limit salt (sodium) if  directed by your health care provider. Sodium restriction may reduce symptoms of heart failure. Ask a dietitian to recommend heart-healthy seasonings. °? Use healthy cooking methods instead of frying. Healthy methods include roasting, grilling, broiling, baking, poaching, steaming, and stir-frying. °· Limit your fluid intake if directed by your health care provider. Fluid restriction may reduce symptoms of heart failure. °Lifestyle °· Stop smoking or using chewing tobacco. Nicotine and tobacco can damage your heart and your blood vessels. Do not use nicotine gum or patches before talking to your health care provider. °· Limit alcohol intake to no more than 1 drink per day for non-pregnant women and 2 drinks per day for men. One drink equals 12 oz of beer, 5 oz of wine, or 1½ oz of hard liquor. °? Drinking more than that is harmful to your heart. Tell your health care provider if you drink alcohol several times a week. °? Talk with your health care provider about whether any level of alcohol use is safe for you. °? If your heart has already been damaged by alcohol or you have severe heart failure, drinking alcohol should be stopped completely. °· Stop use of illegal drugs. °· Lose weight if directed by your health care provider. Weight loss may reduce symptoms of heart failure. °· Do moderate physical activity if directed by your health care provider. People who are elderly and people with severe heart failure should consult with a health care provider for physical activity recommendations. °  Monitor important information °· Weigh yourself every day. Keeping track of your weight daily helps you to notice excess fluid sooner. °? Weigh yourself every morning after you urinate and before you eat breakfast. °? Wear the same amount of clothing each time you weigh yourself. °? Record your daily weight. Provide your health care provider with your weight record. °· Monitor and record your blood pressure as told by your health  care provider. °· Check your pulse as told by your health care provider. °Dealing with extreme temperatures °· If the weather is extremely hot: °? Avoid vigorous physical activity. °? Use air conditioning or fans or seek a cooler location. °? Avoid caffeine and alcohol. °? Wear loose-fitting, lightweight, and light-colored clothing. °· If the weather is extremely cold: °? Avoid vigorous physical activity. °? Layer your clothes. °? Wear mittens or gloves, a hat, and a scarf when you go outside. °? Avoid alcohol. °General instructions °· Manage other health conditions such as hypertension, diabetes, thyroid disease, or abnormal heart rhythms as told by your health care provider. °· Learn to manage stress. If you need help to do this, ask your health care provider. °· Plan rest periods when fatigued. °· Get ongoing education and support as needed. °· Participate in or seek rehabilitation as needed to maintain or improve independence and quality of life. °· Stay up to date with immunizations. Keeping current on pneumococcal and influenza immunizations is especially important to prevent respiratory infections. °· Keep all follow-up visits as told by your health care provider. This is important. °Contact a health care provider if: °· You have a rapid weight gain. °· You have increasing shortness of breath that is unusual for you. °· You are unable to participate in your usual physical activities. °· You tire easily. °· You cough more than normal, especially with physical activity. °· You have any swelling or more swelling in areas such as your hands, feet, ankles, or abdomen. °· You are unable to sleep because it is hard to breathe. °· You feel like your heart is beating quickly (palpitations). °· You become dizzy or light-headed when you stand up. °Get help right away if: °· You have difficulty breathing. °· You notice or your family notices a change in your awareness, such as having trouble staying awake or having  difficulty with concentration. °· You have pain or discomfort in your chest. °· You have an episode of fainting (syncope). °This information is not intended to replace advice given to you by your health care provider. Make sure you discuss any questions you have with your health care provider. °Document Released: 11/02/2005 Document Revised: 07/07/2016 Document Reviewed: 05/27/2016 °[employer] Interactive Patient Education © 2017 [employer] ° °

## 2017-04-02 NOTE — Discharge Summary (Signed)
Physician Discharge Summary  Deborah Ho MVE:[id number] DOB: 31-Jan-1951 DOA: 03/26/2017  PCP: Grayce Sessions, NP  Admit date: 03/26/2017 Discharge date: 04/02/2017  Admitted From: Home Disposition:  Home  Recommendations for Outpatient Follow-up:  Discharge home with home health RN. Follow-up in heart failure clinic. Patient encouraged to participate in her medicine program. High risk for medication nonadherence.  Home Health: RN Equipment/Devices: None  Discharge Condition: Fair CODE STATUS: full code Diet recommendation: Heart Healthy  Discharge weight: 86.7 kg (191 pounds)  Discharge Diagnoses:  Principal Problem:   Acute on chronic combined systolic and diastolic CHF (congestive heart failure) (HCC)   Active Problems:   Acute exacerbation of CHF (congestive heart failure) (HCC)   Essential hypertension   CKD (chronic kidney disease), stage III   Abdominal distension   Anasarca   GERD (gastroesophageal reflux disease)   Noncompliance with medication regimen  Brief narrative/history of present illness Please refer to admission H&P for details, in brief, 66 year old female with coronary artery disease, combined systolic and diastolic CHF with EF of 20-25%, GERD, dyslipidemia, hypertension, presented with worsening abdominal swelling and distention for past [redacted] weeks along with leg swelling. Patient admitted for acute on chronic combined systolic and diastolic CHF.  Principal Problem: Acute on chronic systolic and diastolic CHF (HCC) Secondary to poor medication compliance. She was not taking her recommended dose of torsemide at home and also concern for non- adherence to other home medications. -Echo from March this year showing EF of 20-25%. Received IV Lasix and metolazone with good diuresis. Transitioned to home dose oral torsemide on discharge. Continue Coreg, statin and Aldactone. Will also add losartan . Patient refused to retry Sherryll Burger ( was discontinued after  pt c/o dizziness in the past). -So has refused ICD in the past.   -Patient emphasized on being adherent to her medications. Also educated on diet regimen, fluid intake and monitoring daily weight.  - She was also encouraged to continue participating in the family medicine program with a heart failure clinic.  Appreciate heart failure team follow-up and recommendations. Have arranged outpatient follow-up in the clinic.   Active Problems:  Coronary artery disease With history of STEMI in 2015 and acute stent thrombosis after not taking her brilinta. Pt is allergic to aspirin and does not want to take Plavix and she has enough Brilinta at home. I have written a prescription for brilinta. ( instructed to take twice daily since she was taking it only once a day).  Abdominal distention Secondary to fluid overload. Abdominal ultrasound unremarkable. Resolved.  Elevated total bilirubin and alkaline phosphatase. No clear Etiology. Ultrasound abdomen unremarkable. Total bilirubin normalized. . Essential hypertension Continue home medications.   Chronic kidney disease stage III At baseline.    Hypokalemia  replenished. Continue supplement at home.    Code Status : Full code  Family Communication  : None at bedside  Disposition Plan  : Home with home health RN.    Consults  :  Heart failure  Procedures  : None  Discharge Instructions   Allergies as of 04/02/2017      Reactions   Aspirin Swelling   Chewable children's aspirin makes patients tongue and face swell   Effient [prasugrel] Swelling   Patient's tongue and face swells   Entresto [sacubitril-valsartan] Other (See Comments)   dizziness   Lactose Intolerance (gi) Other (See Comments)   REACTION: stomach upset   Robitussin Dm [guaifenesin-dm] Swelling   Patient's tongue swells   Sulfa Antibiotics Swelling   Wheat  Bran Other (See Comments)   REACTION: unknown      Medication List    STOP taking  these medications   furosemide 40 MG tablet Commonly known as:  LASIX     TAKE these medications   atorvastatin 80 MG tablet Commonly known as:  LIPITOR Take 1 tablet (80 mg total) by mouth daily at 6 PM.   carvedilol 6.25 MG tablet Commonly known as:  COREG Take 1 tablet (6.25 mg total) by mouth 2 (two) times daily with a meal. What changed:  when to take this   cetirizine 10 MG tablet Commonly known as:  ZYRTEC Take 10 mg by mouth daily as needed for allergies.   ferrous sulfate 325 (65 FE) MG tablet Take 325 mg by mouth 2 (two) times a week. Tuesdays and Thursdays   losartan 25 MG tablet Commonly known as:  COZAAR Take 1 tablet (25 mg total) by mouth at bedtime.   nitroGLYCERIN 0.4 MG SL tablet Commonly known as:  NITROSTAT Place 0.4 mg under the tongue every 5 (five) minutes as needed for chest pain.   pantoprazole 20 MG tablet Commonly known as:  PROTONIX Take 20 mg by mouth daily.   potassium chloride SA 20 MEQ tablet Commonly known as:  K-DUR,KLOR-CON Take 1 tablet (20 mEq total) by mouth daily. What changed:  how much to take  when to take this  additional instructions   spironolactone 25 MG tablet Commonly known as:  ALDACTONE Take 1 tablet (25 mg total) by mouth daily.   ticagrelor 60 MG Tabs tablet Commonly known as:  BRILINTA Take 1 tablet (60 mg total) by mouth 2 (two) times daily.   torsemide 20 MG tablet Commonly known as:  DEMADEX Take 3 tablets (60 mg total) by mouth daily.      Follow-up Information    Laurey Morale, MD Follow up on 04/13/2017.   Specialty:  Cardiology Why:  at [address]. [id number]  Contact information: [address] [place] [address] [phone]          Allergies  Allergen Reactions  . Aspirin Swelling    Chewable children's aspirin makes patients tongue and face swell  . Effient [Prasugrel] Swelling    Patient's tongue and face swells  . Entresto [Sacubitril-Valsartan] Other  (See Comments)    dizziness  . Lactose Intolerance (Gi) Other (See Comments)    REACTION: stomach upset  . Robitussin Dm [Guaifenesin-Dm] Swelling    Patient's tongue swells  . Sulfa Antibiotics Swelling  . Wheat Bran Other (See Comments)    REACTION: unknown        Procedures/Studies: Dg Chest 2 View  Result Date: 03/26/2017 CLINICAL DATA:  Ascites. EXAM: CHEST  2 VIEW COMPARISON:  Radiograph of February 18, 2017. FINDINGS: Stable cardiomegaly is noted. No pneumothorax is noted. Left lung is clear. Mild right pleural effusion is noted. Mild right basilar subsegmental atelectasis or infiltrate is noted. Bony thorax is unremarkable. IMPRESSION: Mild right basilar subsegmental atelectasis or infiltrate is noted with associated mild right pleural effusion. Electronically Signed   By: Lupita Raider, M.D.   On: 03/26/2017 14:39   US Abdomen Complete  Result Date: 03/27/2017 CLINICAL DATA:  Abdominal distension. EXAM: ABDOMEN ULTRASOUND COMPLETE COMPARISON:  None. FINDINGS: Gallbladder: The gallbladder has been surgically removed. Common bile duct: Diameter: 3 mm Liver: No focal lesion identified. Within normal limits in parenchymal echogenicity. IVC: No abnormality visualized. Pancreas: Poorly visualized. Visualized portions of the pancreatic head  are normal. Spleen: Size and appearance within normal limits. Right Kidney: Length: 10 cm. Echogenicity within normal limits. No mass or hydronephrosis visualized. Left Kidney: Length: 10 cm. Echogenicity within normal limits. No mass or hydronephrosis visualized. Abdominal aorta: No aneurysm visualized. Other findings: None. IMPRESSION: Difficult study due to patient body habitus and shadowing bowel gas. No acute abnormalities are identified. Electronically Signed   By: Gerome Sam III M.D   On: 03/27/2017 07:33   Dg Abd Acute W/chest  Result Date: 03/26/2017 CLINICAL DATA:  Abdominal distension. EXAM: DG ABDOMEN ACUTE W/ 1V CHEST COMPARISON:   Radiographs of June 26, 2016. FINDINGS: There is no evidence of dilated bowel loops or free intraperitoneal air. No radiopaque calculi or other significant radiographic abnormality is seen. Mild cardiomegaly is noted. Mild right basilar atelectasis or infiltrate is noted with associated pleural effusion. IMPRESSION: No evidence of bowel obstruction or ileus. Mild right basilar atelectasis or infiltrate is noted with associated pleural effusion. Electronically Signed   By: Lupita Raider, M.D.   On: 03/26/2017 17:03       Subjective: Patient is a breathing to be much better today.  Discharge Exam: Vitals:   04/01/17 2136 04/02/17 0625  BP: 123/65 130/61  Pulse: 92 87  Resp: 18   Temp:  97 F (36.1 C)   Vitals:   04/01/17 2000 04/01/17 2136 04/02/17 0404 04/02/17 0625  BP: 124/71 123/65  130/61  Pulse: 91 92  87  Resp: 18 18    Temp: 98 F (36.7 C)   97 F (36.1 C)  TempSrc: Oral   Oral  SpO2: 98% 98%  99%  Weight:   86.7 kg (191 lb 1.6 oz)   Height:        Gen.: Elderly female not in distress HEENT: Moist mucosa, supple neck Chest: Clear bilaterally CVS: Normal S1 and S2, no murmurs GI: Soft, no abdominal distention, nontender, Musculoskeletal: Trace pitting edema bilaterally    The results of significant diagnostics from this hospitalization (including imaging, microbiology, ancillary and laboratory) are listed below for reference.     Microbiology: No results found for this or any previous visit (from the past 240 hour(s)).   Labs: BNP (last 3 results)  Recent Labs  08/06/16 1600 01/28/17 2047 03/26/17 1405  BNP 263.7* 1,920.0* 1,679.1*   Basic Metabolic Panel:  Recent Labs Lab 03/28/17 0337 03/29/17 0418 03/30/17 0450 03/31/17 0458 04/01/17 0355 04/02/17 0524  NA 140 142 138 138 137 136  K 3.4* 3.5 3.7 4.1 4.3 4.2  CL 103 100* 97* 93* 91* 93*  CO2 26 29 30  33* 32 30  GLUCOSE 105* 89 103* 104* 103* 106*  BUN 19 19 24* 29* 33* 43*   CREATININE 1.36* 1.25* 1.36* 1.61* 1.52* 1.71*  CALCIUM 9.0 9.1 9.6 10.0 10.0 10.1  MG 1.9 2.0 1.9 2.1  --   --    Liver Function Tests:  Recent Labs Lab 03/26/17 1619 03/27/17 0307 03/31/17 0458  AST 25 22 26   ALT 14 12* 15  ALKPHOS 199* 178* 214*  BILITOT 2.0* 2.3* 1.8*  PROT 6.9 6.3* 7.6  ALBUMIN 3.4* 3.1* 3.7   No results for input(s): LIPASE, AMYLASE in the last 168 hours. No results for input(s): AMMONIA in the last 168 hours. CBC:  Recent Labs Lab 03/26/17 1405 03/27/17 0307 03/31/17 0458  WBC 8.5 8.0 7.7  HGB 14.0 13.4 14.4  HCT 46.0 44.0 46.5*  MCV 85.0 84.9 83.5  PLT 331 313 328  Cardiac Enzymes: No results for input(s): CKTOTAL, CKMB, CKMBINDEX, TROPONINI in the last 168 hours. BNP: Invalid input(s): POCBNP CBG: No results for input(s): GLUCAP in the last 168 hours. D-Dimer No results for input(s): DDIMER in the last 72 hours. Hgb A1c No results for input(s): HGBA1C in the last 72 hours. Lipid Profile No results for input(s): CHOL, HDL, LDLCALC, TRIG, CHOLHDL, LDLDIRECT in the last 72 hours. Thyroid function studies No results for input(s): TSH, T4TOTAL, T3FREE, THYROIDAB in the last 72 hours.  Invalid input(s): FREET3 Anemia work up No results for input(s): VITAMINB12, FOLATE, FERRITIN, TIBC, IRON, RETICCTPCT in the last 72 hours. Urinalysis    Component Value Date/Time   COLORURINE YELLOW 01/28/2017 1850   APPEARANCEUR CLEAR 01/28/2017 1850   LABSPEC 1.011 01/28/2017 1850   PHURINE 5.0 01/28/2017 1850   GLUCOSEU NEGATIVE 01/28/2017 1850   HGBUR SMALL (A) 01/28/2017 1850   BILIRUBINUR NEGATIVE 01/28/2017 1850   KETONESUR NEGATIVE 01/28/2017 1850   PROTEINUR 30 (A) 01/28/2017 1850   UROBILINOGEN 1.0 07/25/2015 1601   NITRITE NEGATIVE 01/28/2017 1850   LEUKOCYTESUR NEGATIVE 01/28/2017 1850   Sepsis Labs Invalid input(s): PROCALCITONIN,  WBC,  LACTICIDVEN Microbiology No results found for this or any previous visit (from the past 240  hour(s)).   Time coordinating discharge: Over 30 minutes  SIGNED:   Eddie North, MD  [facility] 04/02/2017, 10:38 AM Pager   If 7PM-7AM, please contact night-coverage [email or web address] Password [email or web address]

## 2017-04-10 DIAGNOSIS — I5043 Acute on chronic combined systolic (congestive) and diastolic (congestive) heart failure: Secondary | ICD-10-CM | POA: Diagnosis not present

## 2017-04-10 DIAGNOSIS — I2510 Atherosclerotic heart disease of native coronary artery without angina pectoris: Secondary | ICD-10-CM | POA: Diagnosis not present

## 2017-04-10 DIAGNOSIS — I130 Hypertensive heart and chronic kidney disease with heart failure and stage 1 through stage 4 chronic kidney disease, or unspecified chronic kidney disease: Secondary | ICD-10-CM | POA: Diagnosis not present

## 2017-04-10 DIAGNOSIS — I255 Ischemic cardiomyopathy: Secondary | ICD-10-CM | POA: Diagnosis not present

## 2017-04-13 ENCOUNTER — Ambulatory Visit (HOSPITAL_COMMUNITY)
Admit: 2017-04-13 | Discharge: 2017-04-13 | Disposition: A | Discharge status: Home / Self Care | Payer: Medicare Other | Source: Ambulatory Visit | Attending: Cardiology | Admitting: Cardiology

## 2017-04-13 VITALS — BP 142/100 | HR 101 | Wt 195.8 lb

## 2017-04-13 DIAGNOSIS — I2510 Atherosclerotic heart disease of native coronary artery without angina pectoris: Secondary | ICD-10-CM

## 2017-04-13 DIAGNOSIS — Z8249 Family history of ischemic heart disease and other diseases of the circulatory system: Secondary | ICD-10-CM | POA: Insufficient documentation

## 2017-04-13 DIAGNOSIS — E785 Hyperlipidemia, unspecified: Secondary | ICD-10-CM | POA: Diagnosis not present

## 2017-04-13 DIAGNOSIS — Z833 Family history of diabetes mellitus: Secondary | ICD-10-CM | POA: Diagnosis not present

## 2017-04-13 DIAGNOSIS — I255 Ischemic cardiomyopathy: Secondary | ICD-10-CM | POA: Diagnosis not present

## 2017-04-13 DIAGNOSIS — I10 Essential (primary) hypertension: Secondary | ICD-10-CM | POA: Diagnosis not present

## 2017-04-13 DIAGNOSIS — I252 Old myocardial infarction: Secondary | ICD-10-CM | POA: Insufficient documentation

## 2017-04-13 DIAGNOSIS — I5043 Acute on chronic combined systolic (congestive) and diastolic (congestive) heart failure: Secondary | ICD-10-CM

## 2017-04-13 DIAGNOSIS — N183 Chronic kidney disease, stage 3 unspecified: Secondary | ICD-10-CM

## 2017-04-13 DIAGNOSIS — Z7902 Long term (current) use of antithrombotics/antiplatelets: Secondary | ICD-10-CM | POA: Insufficient documentation

## 2017-04-13 DIAGNOSIS — Z79899 Other long term (current) drug therapy: Secondary | ICD-10-CM | POA: Insufficient documentation

## 2017-04-13 DIAGNOSIS — F17210 Nicotine dependence, cigarettes, uncomplicated: Secondary | ICD-10-CM | POA: Insufficient documentation

## 2017-04-13 DIAGNOSIS — Z955 Presence of coronary angioplasty implant and graft: Secondary | ICD-10-CM | POA: Diagnosis not present

## 2017-04-13 DIAGNOSIS — I5022 Chronic systolic (congestive) heart failure: Secondary | ICD-10-CM | POA: Diagnosis not present

## 2017-04-13 DIAGNOSIS — I130 Hypertensive heart and chronic kidney disease with heart failure and stage 1 through stage 4 chronic kidney disease, or unspecified chronic kidney disease: Secondary | ICD-10-CM | POA: Diagnosis not present

## 2017-04-13 LAB — BASIC METABOLIC PANEL
Anion gap: 9 (ref 5–15)
BUN: 20 mg/dL (ref 6–20)
CO2: 26 mmol/L (ref 22–32)
Calcium: 9.5 mg/dL (ref 8.9–10.3)
Chloride: 104 mmol/L (ref 101–111)
Creatinine, Ser: 1.21 mg/dL — ABNORMAL HIGH (ref 0.44–1.00)
GFR calc Af Amer: 53 mL/min — ABNORMAL LOW (ref >60)
GFR calc non Af Amer: 46 mL/min — ABNORMAL LOW (ref >60)
Glucose, Bld: 103 mg/dL — ABNORMAL HIGH (ref 65–99)
Potassium: 3.6 mmol/L (ref 3.5–5.1)
Sodium: 139 mmol/L (ref 135–145)

## 2017-04-13 MED ORDER — POTASSIUM CHLORIDE CRYS ER 20 MEQ PO TBCR: 20.0000 meq | EXTENDED_RELEASE_TABLET | Freq: Every day | ORAL | 3 refills | Status: DC

## 2017-04-13 MED ORDER — SPIRONOLACTONE 25 MG PO TABS: 25.0000 mg | ORAL_TABLET | Freq: Every day | ORAL | 3 refills | Status: DC

## 2017-04-13 MED ORDER — TORSEMIDE 20 MG PO TABS: 60.0000 mg | ORAL_TABLET | Freq: Every day | ORAL | 3 refills | Status: DC

## 2017-04-13 NOTE — Progress Notes (Signed)
Patient ID: Deborah Ho, female   DOB: February 11, 1951, 66 y.o.   MRN: [id number]    Advanced Heart Failure Clinic Note   PCP: [name] Primary HF MD: Dr Shirlee Latch   HPI: Deborah Ho is a 66 y.o. female with a history of HTN, HLD, tobacco abuse, CAD s/panterior STEMI with early stent thrombosis for missing doses of Brilinta s/p PCI x 2 into LAD (04/2014) and chronic systolic HF with EF 20% by 3/16 echo.   Admitted to [facility] in 3/16 with CHF and R pleural effusion. Effusion tapped (transudative). Echo with EF down to 20%. Diuresed with IV lasix and transitioned torsemide + metolazone as needed for weight >187 pounds. Creatinine was 1.7 on the day discharge. Discharge weight 183 pounds.   Admitted earlier this month with A/C HF in setting of medication and dietary non-compliance.  Diuresed 18 L and 33 lbs.   She presents today for post hospital follow up.  Weight has been stable at home between 190-192 since discharge.  Taking all medications. Taking coreg once daily. Encouraged to take twice daily.  Denies DOE since discharge. Denies lightheadedness, dizziness, or orthopnea. Cellulitis resolved (from last visit). Watching fluid and salt carefully.   Review of systems complete and found to be negative unless listed in HPI.    9/16: Cardiac MRI with EF 19%, LAD territory scar (nonviable), no thrombus.  07/25/2015: ECHO EF 25% RV normal 01/30/2015 ECHO EF 20%  11/13/2014: ECHO EF 35-40% RV normal.  02/21/16 ECHO EF 15%. Mod reduced RV function, severe TR  Labs 02/05/2015: K 4.7 Creatinine 1.72  Labs 01/28/2015: K 3.4 Creatinine 1.16  Labs 4/16: K 3.3, creatinine 1.72 Labs 6/16: K 2.9, creatinine 0.97, BNP 1536, HCT 42.2 Labs 7/16: K 3.4, creatinine 1.18 Labs 9/16: creatinine 1.5 Labs 07/16/16: K 4.6, Creatinine 1.32  SH:  Social History   Social History  . Marital status: Widowed    Spouse name: N/A  . Number of children: N/A  . Years of education: N/A   Occupational History  .  not employed    Social History Main Topics  . Smoking status: Current Some Day Smoker    Packs/day: 0.10    Years: 0.50    Types: Cigarettes  . Smokeless tobacco: Current User     Comment: down to 3 cigarettes daily (08/03/14)  . Alcohol use No  . Drug use: No  . Sexual activity: Not on file   Other Topics Concern  . Not on file   Social History Narrative   Patient has 6 brothers and sisters and none have known coronary artery disease. She lives alone in [place], but has several brothers in the area.    FH:  Family History  Problem Relation Age of Onset  . Diabetes Mother   . Hypertension Mother     Past Medical History:  Diagnosis Date  . CAD (coronary artery disease) 05/03/14; 05/09/14   a. anterior STEMI with early in-stent thorombosis for missed dose of Brillinta s/p PCI with DESx 2 into LAD (04/2014)  . CHF (congestive heart failure) (HCC)   . GERD (gastroesophageal reflux disease)    Probable  . HTN (hypertension)   . Hyperlipidemia   . Ischemic cardiomyopathy    a. 04/2014 ECHO with EF 45-50% b.  Repeat 2D echo 08/14/14 with EF down at 15%. Life vest placed  . Non compliance w medication regimen   . Tobacco use     Current Outpatient Prescriptions  Medication Sig Dispense Refill  .  atorvastatin (LIPITOR) 80 MG tablet Take 1 tablet (80 mg total) by mouth daily at 6 PM. 30 tablet 0  . carvedilol (COREG) 6.25 MG tablet Take 6.25 mg by mouth daily.    . cetirizine (ZYRTEC) 10 MG tablet Take 10 mg by mouth daily as needed for allergies.     . cholecalciferol (VITAMIN D) 1000 units tablet Take 1,000 Units by mouth every 30 (thirty) days.    . ferrous sulfate 325 (65 FE) MG tablet Take 325 mg by mouth 2 (two) times a week. Tuesdays and Thursdays    . losartan (COZAAR) 25 MG tablet Take 1 tablet (25 mg total) by mouth at bedtime. 30 tablet 0  . magnesium oxide (MAG-OX) 400 (241.3 Mg) MG tablet Take 400 mg by mouth every 30 (thirty) days.    . potassium chloride SA  (K-DUR,KLOR-CON) 20 MEQ tablet Take 1 tablet (20 mEq total) by mouth daily. 30 tablet 3  . spironolactone (ALDACTONE) 25 MG tablet Take 1 tablet (25 mg total) by mouth daily. 30 tablet 3  . ticagrelor (BRILINTA) 60 MG TABS tablet Take 1 tablet (60 mg total) by mouth 2 (two) times daily. 60 tablet 0  . torsemide (DEMADEX) 20 MG tablet Take 3 tablets (60 mg total) by mouth daily. 90 tablet 3  . nitroGLYCERIN (NITROSTAT) 0.4 MG SL tablet Place 0.4 mg under the tongue every 5 (five) minutes as needed for chest pain.     No current facility-administered medications for this encounter.     Vitals:   04/13/17 0919  BP: (!) 142/100  Pulse: (!) 101  SpO2: 98%  Weight: 195 lb 12.8 oz (88.8 kg)   Wt Readings from Last 3 Encounters:  04/13/17 195 lb 12.8 oz (88.8 kg)  04/02/17 191 lb 1.6 oz (86.7 kg)  02/21/17 211 lb 11.2 oz (96 kg)    PHYSICAL EXAM: General: Well appearing. No resp difficulty. HEENT: Normal Neck: supple. JVD 6-7. Carotids 2+ bilat; no bruits. No thyromegaly or nodule noted. Cor: PMI nondisplaced. RRR, No M/G/R noted Lungs: CTAB, normal effort. Abdomen: soft, non-tender, distended, no HSM. No bruits or masses. +BS  Extremities: no cyanosis, clubbing, or rash. Trace ankle edema.  Neuro: alert & orientedx3, cranial nerves grossly intact. moves all 4 extremities w/o difficulty. Affect flat.  ASSESSMENT & PLAN: 1.  Chronic systolic CHF:  Ischemic cardiomyopathy, Echo 02/21/16 15%.  With recent admission for A/C HF.  - Echo 02/01/17 with LVEF 20-25%, grade 3 DD, Mild MR, Mild LAE, RV Mod dilated and reduced. Mild/Mod TR.  - NYHA Class II. Volume status stable on exam.  - Continue torsemide 60 mg daily and Spironolactone 25 mg daily.  - Continue coreg 6.25 mg BID.  Encouraged to take BID instead of daily. .  - Continue losartan 25 mg daily.  - Reinforced fluid restriction to < 2 L daily, sodium restriction to less than 2000 mg daily, and the importance of daily weights.   2. CAD:  s/p anterior MI,  PCI x2 LAD 04/2014.  History of stent thrombosis. - Continue Brilinta 80 mg, atorvastatin 80 mg daily, and Coreg as above.  - No CP or concerns for ACS.    3. HTN-  - Has not taken medications this am.   - Re-fuses to rechallenge Entresto.  - Meds as above. Refuses up-titration.  4. CKD stage III - Mildly elevated on discharge. BMET today.   Stressed importance of compliance and that she consider paramedicine. BMET today. RTC 6 weeks  Casimiro Needle  Alejandro Mulling, PA-C 04/13/2017

## 2017-04-13 NOTE — Patient Instructions (Signed)
Labs today We will only contact you if something comes back abnormal or we need to make some changes. Otherwise no news is good news!  Your physician recommends that you schedule a follow-up appointment in: 4-6 weeks with Deborah Rising   Do the following things EVERYDAY: 1) Weigh yourself in the morning before breakfast. Write it down and keep it in a log. 2) Take your medicines as prescribed 3) Eat low salt foods-Limit salt (sodium) to 2000 mg per day.  4) Stay as active as you can everyday 5) Limit all fluids for the day to less than 2 liters

## 2017-04-13 NOTE — Progress Notes (Signed)
Advanced Heart Failure Medication Review by a Pharmacist  Does the patient  feel that his/her medications are working for him/her?  yes  Has the patient been experiencing any side effects to the medications prescribed?  no  Does the patient measure his/her own blood pressure or blood glucose at home?  no   Does the patient have any problems obtaining medications due to transportation or finances?   yes  Understanding of regimen: fair Understanding of indications: fair Potential of compliance: fair Patient understands to avoid NSAIDs. Patient understands to avoid decongestants.  Issues to address at subsequent visits: Compliance/Brilinta assistance   Pharmacist comments: Ms. Detter is a 66 yo F presenting without a medication list but with fair understanding of her regimen. She reports good compliance with her regimen but does report still taking her carvedilol only once a day 2/2 her BP dropping too low if she takes it BID. She also stated that her Brilinta copay is >$200/mo so will look into patient assistance for her. No other medication-related questions or concerns for me at this time.   [name]. Bonnye Fava, PharmD, BCPS, CPP Clinical Pharmacist Pager: [phone] Phone: [phone] 04/13/2017 9:30 AM      Time with patient: 10 minutes Preparation and documentation time: 10 minutes Total time: 20 minutes

## 2017-04-19 ENCOUNTER — Encounter: Payer: Self-pay | Admitting: Vascular Surgery

## 2017-04-20 NOTE — Addendum Note (Signed)
Encounter addended by: Faylynn Stamos Andrew, PA-C on: 04/20/2017  4:41 PM<BR>    Actions taken: LOS modified

## 2017-04-21 ENCOUNTER — Encounter (HOSPITAL_COMMUNITY): Payer: Self-pay

## 2017-04-22 ENCOUNTER — Ambulatory Visit (INDEPENDENT_AMBULATORY_CARE_PROVIDER_SITE_OTHER): Payer: Medicare Other | Admitting: Vascular Surgery

## 2017-04-22 ENCOUNTER — Ambulatory Visit (HOSPITAL_COMMUNITY)
Admission: RE | Admit: 2017-04-22 | Discharge: 2017-04-22 | Disposition: A | Discharge status: Home / Self Care | Payer: Medicare Other | Source: Ambulatory Visit | Attending: Vascular Surgery | Admitting: Vascular Surgery

## 2017-04-22 ENCOUNTER — Encounter: Payer: Self-pay | Admitting: Vascular Surgery

## 2017-04-22 VITALS — BP 146/95 | HR 90 | Temp 97.3°F | Resp 18 | Ht 64.0 in | Wt 201.7 lb

## 2017-04-22 DIAGNOSIS — I83892 Varicose veins of left lower extremities with other complications: Secondary | ICD-10-CM | POA: Diagnosis not present

## 2017-04-22 DIAGNOSIS — I8392 Asymptomatic varicose veins of left lower extremity: Secondary | ICD-10-CM | POA: Diagnosis not present

## 2017-04-22 DIAGNOSIS — R600 Localized edema: Secondary | ICD-10-CM | POA: Diagnosis present

## 2017-04-22 DIAGNOSIS — I739 Peripheral vascular disease, unspecified: Secondary | ICD-10-CM | POA: Diagnosis not present

## 2017-04-22 DIAGNOSIS — I255 Ischemic cardiomyopathy: Secondary | ICD-10-CM

## 2017-04-22 NOTE — Progress Notes (Signed)
Referring Physician: Dr Meyer Russel  Patient name: Deborah Ho MRN: [id number] DOB: March 01, 1951 Sex: female  REASON FOR CONSULT: Leg cramps swelling ulcer  HPI: Deborah Ho is a 66 y.o. female who was recently seen in the wound center for nonhealing wound of her left leg. The wound has now completely healed. She has been discharged from the wound center. The wound took approximately one month to heal. It was recommended that she wear compression stockings afterwards. She has not yet started wearing these. She has a lengthy cardiac history and a history of heart failure. She was admitted to the hospital in May with total body anasarca. She is under the care of Dr. Ronelle Nigh for her congestive failure. She denies a history of tobacco use although had some experimentation many years ago. She denies history of diabetes. She does have hypertension and hyperlipidemia. She denies any significant history of varicose veins. She denies claudication or rest pain. She does occasionally get some numbness in her left foot when sitting long periods of time. She occasionally gets a right leg cramp when swimming. She currently has no open wounds. She is on Brilinta and Lipitor.  Past Medical History:  Diagnosis Date  . CAD (coronary artery disease) 05/03/14; 05/09/14   a. anterior STEMI with early in-stent thorombosis for missed dose of Brillinta s/p PCI with DESx 2 into LAD (04/2014)  . CHF (congestive heart failure) (HCC)   . GERD (gastroesophageal reflux disease)    Probable  . HTN (hypertension)   . Hyperlipidemia   . Ischemic cardiomyopathy    a. 04/2014 ECHO with EF 45-50% b.  Repeat 2D echo 08/14/14 with EF down at 15%. Life vest placed  . Non compliance w medication regimen   . Tobacco use    Past Surgical History:  Procedure Laterality Date  . CHOLECYSTECTOMY    . CORONARY ANGIOPLASTY WITH STENT PLACEMENT  05/03/14   STEMI- stent to LAD DES- Xience alpine  . CORONARY ANGIOPLASTY WITH STENT PLACEMENT   05/09/14   STEMI- overlapping stent to LAD, pt had missed a dose of Brilinta  . LEFT HEART CATHETERIZATION WITH CORONARY ANGIOGRAM N/A 05/03/2014   Procedure: LEFT HEART CATHETERIZATION WITH CORONARY ANGIOGRAM;  Surgeon: Peter M Swaziland, MD;  Location: [facility] CATH LAB;  Service: Cardiovascular;  Laterality: N/A;  . LEFT HEART CATHETERIZATION WITH CORONARY ANGIOGRAM N/A 05/09/2014   Procedure: LEFT HEART CATHETERIZATION WITH CORONARY ANGIOGRAM;  Surgeon: Corky Crafts, MD;  Location: [facility] CATH LAB;  Service: Cardiovascular;  Laterality: N/A;  . PARTIAL HYSTERECTOMY    . PERCUTANEOUS STENT INTERVENTION  05/03/2014   Procedure: PERCUTANEOUS STENT INTERVENTION;  Surgeon: Peter M Swaziland, MD;  Location: [facility] CATH LAB;  Service: Cardiovascular;;  DES Prox LAD     Family History  Problem Relation Age of Onset  . Diabetes Mother   . Hypertension Mother     SOCIAL HISTORY: Social History   Social History  . Marital status: Widowed    Spouse name: N/A  . Number of children: N/A  . Years of education: N/A   Occupational History  . not employed    Social History Main Topics  . Smoking status: Current Some Day Smoker    Packs/day: 0.10    Years: 0.50    Types: Cigarettes  . Smokeless tobacco: Current User     Comment: down to 3 cigarettes daily (08/03/14)  . Alcohol use No  . Drug use: No  . Sexual activity: Not on file   Other Topics  Concern  . Not on file   Social History Narrative   Patient has 6 brothers and sisters and none have known coronary artery disease. She lives alone in [place], but has several brothers in the area.    Allergies  Allergen Reactions  . Aspirin Swelling    Chewable children's aspirin makes patients tongue and face swell  . Effient [Prasugrel] Swelling    Patient's tongue and face swells  . Entresto [Sacubitril-Valsartan] Other (See Comments)    dizziness  . Lactose Intolerance (Gi) Other (See Comments)    REACTION: stomach upset  . Robitussin Dm  [Guaifenesin-Dm] Swelling    Patient's tongue swells  . Sulfa Antibiotics Swelling  . Wheat Bran Other (See Comments)    REACTION: unknown    Current Outpatient Prescriptions  Medication Sig Dispense Refill  . atorvastatin (LIPITOR) 80 MG tablet Take 1 tablet (80 mg total) by mouth daily at 6 PM. 30 tablet 0  . carvedilol (COREG) 6.25 MG tablet Take 6.25 mg by mouth daily.    . cetirizine (ZYRTEC) 10 MG tablet Take 10 mg by mouth daily as needed for allergies.     . cholecalciferol (VITAMIN D) 1000 units tablet Take 1,000 Units by mouth every 30 (thirty) days.    . ferrous sulfate 325 (65 FE) MG tablet Take 325 mg by mouth 2 (two) times a week. Tuesdays and Thursdays    . losartan (COZAAR) 25 MG tablet Take 1 tablet (25 mg total) by mouth at bedtime. 30 tablet 0  . nitroGLYCERIN (NITROSTAT) 0.4 MG SL tablet Place 0.4 mg under the tongue every 5 (five) minutes as needed for chest pain.    . potassium chloride SA (K-DUR,KLOR-CON) 20 MEQ tablet Take 1 tablet (20 mEq total) by mouth daily. 30 tablet 3  . spironolactone (ALDACTONE) 25 MG tablet Take 1 tablet (25 mg total) by mouth daily. 30 tablet 3  . ticagrelor (BRILINTA) 60 MG TABS tablet Take 1 tablet (60 mg total) by mouth 2 (two) times daily. 60 tablet 0  . torsemide (DEMADEX) 20 MG tablet Take 3 tablets (60 mg total) by mouth daily. 90 tablet 3  . magnesium oxide (MAG-OX) 400 (241.3 Mg) MG tablet Take 400 mg by mouth every 30 (thirty) days.     No current facility-administered medications for this visit.     ROS:   General:  No weight loss, Fever, chills  HEENT: No recent headaches, no nasal bleeding, no visual changes, no sore throat  Neurologic: No dizziness, blackouts, seizures. No recent symptoms of stroke or mini- stroke. No recent episodes of slurred speech, or temporary blindness.  Cardiac: No recent episodes of chest pain/pressure, no shortness of breath at rest.  No shortness of breath with exertion.  Denies history of  atrial fibrillation or irregular heartbeat  Vascular: No history of rest pain in feet.  No history of claudication.  + history of non-healing ulcer, No history of DVT   Pulmonary: No home oxygen, no productive cough, no hemoptysis,  No asthma or wheezing  Musculoskeletal:  [ ]  Arthritis, [ ]  Low back pain,  [ ]  Joint pain  Hematologic:No history of hypercoagulable state.  No history of easy bleeding.  No history of anemia  Gastrointestinal: No hematochezia or melena,  No gastroesophageal reflux, no trouble swallowing  Urinary: [X]  chronic Kidney disease, [ ]  on HD - [ ]  MWF or [ ]  TTHS, [ ]  Burning with urination, [ ]  Frequent urination, [ ]  Difficulty urinating;   Skin: No  rashes  Psychological: No history of anxiety,  No history of depression   Physical Examination  Vitals:   04/22/17 1214  BP: (!) 146/95  Pulse: 90  Resp: 18  Temp: 97.3 F (36.3 C)  TempSrc: Oral  SpO2: 98%  Weight: 201 lb 11.2 oz (91.5 kg)  Height: 5\' 4"  (1.626 m)    Body mass index is 34.62 kg/m.  General:  Alert and oriented, no acute distress HEENT: Normal Neck: No bruit or JVD Pulmonary: Clear to auscultation bilaterally Cardiac: Regular Rate and Rhythm without murmur Abdomen: Soft, non-tender, non-distended, no mass Skin: No rash or ulcer Extremity Pulses:  2+ radial, brachial, femoral, 2+ right dorsalis pedis, absent left dorsalis pedis pulse absent posterior tibial pulses bilaterally Musculoskeletal: No deformity or edema  Neurologic: Upper and lower extremity motor 5/5 and symmetric  DATA:  Patient had bilateral ABIs performed at [facility] in March 2018. ABI on the left was 1.09 right was 1.17 biphasic waveforms.  She had a venous duplex exam today which showed reflux in her left greater saphenous vein but vein diameter was only 4 mm. She had no reflux in the right leg.  ASSESSMENT:  Patient with prior nonhealing ulcer which is now healed with local wound care. She most likely has congestive  failure with lower extremity peripheral edema as well as some mild reflux as contributors to this. She also probably has some mild component of arterial occlusive disease. However ulcer was on the calf and usually these are not necessarily related to arterial occlusive disease in her ABI was just under normal but her physical exam shows she probably does have some component of arterial occlusive disease.   PLAN:  #1 bilateral lower extremity compression stockings 15-20 mm knee high. #2 follow-up 6 months with our nurse practitioner with repeat ABIs and arterial duplex. With her underlying cardiac and renal dysfunction I would not pursue any intervention unless she is at risk of limb loss. She currently is not.   Fabienne Bruns, MD [facility] Office: [phone] Pager: [phone]

## 2017-05-07 NOTE — Addendum Note (Signed)
Addended by: Burton Apley A on: 05/07/2017 09:39 AM   Modules accepted: Orders

## 2017-05-24 ENCOUNTER — Encounter (HOSPITAL_COMMUNITY): Payer: Self-pay

## 2017-09-22 ENCOUNTER — Inpatient Hospital Stay (HOSPITAL_COMMUNITY)
Admission: EM | Admit: 2017-09-22 | Discharge: 2017-09-29 | DRG: 291 | Disposition: A | Discharge status: Home / Self Care | Payer: Medicare Other | Attending: Family Medicine | Admitting: Family Medicine

## 2017-09-22 ENCOUNTER — Emergency Department (HOSPITAL_COMMUNITY): Payer: Medicare Other

## 2017-09-22 ENCOUNTER — Other Ambulatory Visit: Payer: Self-pay

## 2017-09-22 ENCOUNTER — Encounter (HOSPITAL_COMMUNITY): Payer: Self-pay | Admitting: *Deleted

## 2017-09-22 DIAGNOSIS — Z888 Allergy status to other drugs, medicaments and biological substances status: Secondary | ICD-10-CM

## 2017-09-22 DIAGNOSIS — E785 Hyperlipidemia, unspecified: Secondary | ICD-10-CM | POA: Diagnosis present

## 2017-09-22 DIAGNOSIS — I5043 Acute on chronic combined systolic (congestive) and diastolic (congestive) heart failure: Secondary | ICD-10-CM | POA: Diagnosis present

## 2017-09-22 DIAGNOSIS — I10 Essential (primary) hypertension: Secondary | ICD-10-CM | POA: Diagnosis present

## 2017-09-22 DIAGNOSIS — F17210 Nicotine dependence, cigarettes, uncomplicated: Secondary | ICD-10-CM | POA: Diagnosis present

## 2017-09-22 DIAGNOSIS — Z9114 Patient's other noncompliance with medication regimen: Secondary | ICD-10-CM

## 2017-09-22 DIAGNOSIS — I255 Ischemic cardiomyopathy: Secondary | ICD-10-CM | POA: Diagnosis present

## 2017-09-22 DIAGNOSIS — Z955 Presence of coronary angioplasty implant and graft: Secondary | ICD-10-CM

## 2017-09-22 DIAGNOSIS — N183 Chronic kidney disease, stage 3 unspecified: Secondary | ICD-10-CM | POA: Diagnosis present

## 2017-09-22 DIAGNOSIS — Z8249 Family history of ischemic heart disease and other diseases of the circulatory system: Secondary | ICD-10-CM

## 2017-09-22 DIAGNOSIS — Z9119 Patient's noncompliance with other medical treatment and regimen: Secondary | ICD-10-CM

## 2017-09-22 DIAGNOSIS — Z886 Allergy status to analgesic agent status: Secondary | ICD-10-CM

## 2017-09-22 DIAGNOSIS — I252 Old myocardial infarction: Secondary | ICD-10-CM

## 2017-09-22 DIAGNOSIS — E739 Lactose intolerance, unspecified: Secondary | ICD-10-CM | POA: Diagnosis present

## 2017-09-22 DIAGNOSIS — I130 Hypertensive heart and chronic kidney disease with heart failure and stage 1 through stage 4 chronic kidney disease, or unspecified chronic kidney disease: Principal | ICD-10-CM | POA: Diagnosis present

## 2017-09-22 DIAGNOSIS — Z6834 Body mass index (BMI) 34.0-34.9, adult: Secondary | ICD-10-CM

## 2017-09-22 DIAGNOSIS — I50813 Acute on chronic right heart failure: Secondary | ICD-10-CM

## 2017-09-22 DIAGNOSIS — Z90711 Acquired absence of uterus with remaining cervical stump: Secondary | ICD-10-CM

## 2017-09-22 DIAGNOSIS — Z882 Allergy status to sulfonamides status: Secondary | ICD-10-CM

## 2017-09-22 DIAGNOSIS — Z91018 Allergy to other foods: Secondary | ICD-10-CM

## 2017-09-22 DIAGNOSIS — Z7902 Long term (current) use of antithrombotics/antiplatelets: Secondary | ICD-10-CM

## 2017-09-22 DIAGNOSIS — E669 Obesity, unspecified: Secondary | ICD-10-CM | POA: Diagnosis present

## 2017-09-22 DIAGNOSIS — I081 Rheumatic disorders of both mitral and tricuspid valves: Secondary | ICD-10-CM | POA: Diagnosis present

## 2017-09-22 DIAGNOSIS — I2510 Atherosclerotic heart disease of native coronary artery without angina pectoris: Secondary | ICD-10-CM | POA: Diagnosis present

## 2017-09-22 DIAGNOSIS — E876 Hypokalemia: Secondary | ICD-10-CM | POA: Diagnosis present

## 2017-09-22 LAB — BASIC METABOLIC PANEL WITH GFR
Anion gap: 10 (ref 5–15)
BUN: 15 mg/dL (ref 6–20)
CO2: 25 mmol/L (ref 22–32)
Calcium: 9.2 mg/dL (ref 8.9–10.3)
Chloride: 107 mmol/L (ref 101–111)
Creatinine, Ser: 1.23 mg/dL — ABNORMAL HIGH (ref 0.44–1.00)
GFR calc Af Amer: 52 mL/min — ABNORMAL LOW
GFR calc non Af Amer: 45 mL/min — ABNORMAL LOW
Glucose, Bld: 102 mg/dL — ABNORMAL HIGH (ref 65–99)
Potassium: 3.6 mmol/L (ref 3.5–5.1)
Sodium: 142 mmol/L (ref 135–145)

## 2017-09-22 LAB — CBC
HCT: 46.8 % — ABNORMAL HIGH (ref 36.0–46.0)
Hemoglobin: 14.6 g/dL (ref 12.0–15.0)
MCH: 26.3 pg (ref 26.0–34.0)
MCHC: 31.2 g/dL (ref 30.0–36.0)
MCV: 84.2 fL (ref 78.0–100.0)
Platelets: 298 10*3/uL (ref 150–400)
RBC: 5.56 MIL/uL — ABNORMAL HIGH (ref 3.87–5.11)
RDW: 16.4 % — ABNORMAL HIGH (ref 11.5–15.5)
WBC: 8.6 10*3/uL (ref 4.0–10.5)

## 2017-09-22 LAB — I-STAT TROPONIN, ED: Troponin i, poc: 0.02 ng/mL (ref 0.00–0.08)

## 2017-09-22 MED ORDER — ALBUTEROL SULFATE (2.5 MG/3ML) 0.083% IN NEBU
5.0000 mg | INHALATION_SOLUTION | Freq: Once | RESPIRATORY_TRACT | Status: AC
  Administered 2017-09-22: 5 mg via RESPIRATORY_TRACT
  Filled 2017-09-22: qty 6

## 2017-09-22 MED ORDER — FUROSEMIDE 10 MG/ML IJ SOLN
40.0000 mg | Freq: Once | INTRAMUSCULAR | Status: AC
  Administered 2017-09-23: 40 mg via INTRAVENOUS
  Filled 2017-09-22: qty 4

## 2017-09-22 MED ORDER — HYDROCOD POLST-CPM POLST ER 10-8 MG/5ML PO SUER
5.0000 mL | Freq: Once | ORAL | Status: AC
  Administered 2017-09-22: 5 mL via ORAL
  Filled 2017-09-22: qty 5

## 2017-09-22 NOTE — ED Triage Notes (Signed)
Pt reports being chf patient. Having swelling and pain to entire body including legs and abd. Denies SOB.

## 2017-09-22 NOTE — ED Provider Notes (Signed)
[facility] EMERGENCY DEPARTMENT Provider Note   CSN: [id number] Arrival date & time: 09/22/17  1609     History   Chief Complaint Chief Complaint  Patient presents with  . Leg Swelling    HPI Deborah Ho is a 66 y.o. female.  Patient with a history of CHF, ischemic CM, HTN, HLD, GERD, CAD, medication noncompliance presents with c/o bilateral LE edema and discomfort for the past 4 days. She gives a history of 4 weeks of URI symptoms including afebrile cough, treated by her doctor as a flu-like illness with supportive care only. Cough is persistent causing post-tussive emesis. She denies CP, or SOB. She is on diuretics for CHF and edema but reports her edema usually "only affects my abdomen and upper thighs". The swelling in her LE's started 4 days ago, per patient. Seen by her PCP today and sent here for further evaluation. No fever, no change in appetite, post-tussive vomiting only, no change in BM's, no nausea.   The history is provided by the patient. No language interpreter was used.    Past Medical History:  Diagnosis Date  . CAD (coronary artery disease) 05/03/14; 05/09/14   a. anterior STEMI with early in-stent thorombosis for missed dose of Brillinta s/p PCI with DESx 2 into LAD (04/2014)  . CHF (congestive heart failure) (HCC)   . GERD (gastroesophageal reflux disease)    Probable  . HTN (hypertension)   . Hyperlipidemia   . Ischemic cardiomyopathy    a. 04/2014 ECHO with EF 45-50% b.  Repeat 2D echo 08/14/14 with EF down at 15%. Life vest placed  . Non compliance w medication regimen   . Tobacco use     Patient Active Problem List   Diagnosis Date Noted  . Acute on chronic combined systolic and diastolic CHF (congestive heart failure) (HCC) 04/02/2017  . Noncompliance with medication regimen 04/02/2017  . Intractable nausea and vomiting 02/18/2017  . HTN (hypertension)   . Hyperlipidemia   . GERD (gastroesophageal reflux disease)   . CHF  (congestive heart failure) (HCC)   . Cellulitis of left lower extremity   . Cough productive of clear sputum   . Cellulitis of left lower leg   . Hypokalemia 01/28/2017  . Peripheral edema   . Abdominal distension 06/26/2016  . Anasarca 06/26/2016  . Chronic systolic heart failure (HCC) 02/20/2016  . CKD (chronic kidney disease), stage III (HCC)   . Adjustment disorder with mixed anxiety and depressed mood 02/28/2015  . Pleural effusion on right   . Essential hypertension   . Angioedema of lips 08/17/2014  . Ischemic cardiomyopathy 08/13/2014  . Coronary atherosclerosis of native coronary artery 05/10/2014  . STEMI (ST elevation myocardial infarction) (HCC) 05/09/2014  . HLD (hyperlipidemia) 05/06/2014  . Tobacco abuse 05/06/2014  . Cardiomyopathy, ischemic, EF 30-35 % Echo 05/04/14. 05/06/2014  . Elevated LFTs 05/06/2014  . ST elevation myocardial infarction (STEMI) of anterior wall (HCC) 05/03/2014    Past Surgical History:  Procedure Laterality Date  . CHOLECYSTECTOMY    . CORONARY ANGIOPLASTY WITH STENT PLACEMENT  05/03/14   STEMI- stent to LAD DES- Xience alpine  . CORONARY ANGIOPLASTY WITH STENT PLACEMENT  05/09/14   STEMI- overlapping stent to LAD, pt had missed a dose of Brilinta  . PARTIAL HYSTERECTOMY      OB History    No data available       Home Medications    Prior to Admission medications   Medication Sig Start Date End  Date Taking? Authorizing Provider  atorvastatin (LIPITOR) 80 MG tablet Take 1 tablet (80 mg total) by mouth daily at 6 PM. 07/06/16  Yes Johnson, Clanford L, MD  carvedilol (COREG) 3.125 MG tablet Take 3.125 mg every other day by mouth.    Yes [provider]  cetirizine (ZYRTEC) 10 MG tablet Take 10 mg daily by mouth.    Yes [provider]  cholecalciferol (VITAMIN D) 1000 units tablet Take 1,000 Units by mouth. Twice a month   Yes [provider]  ferrous sulfate 325 (65 FE) MG tablet Take 325 mg 3 (three) times  daily by mouth. Monday,Wednesday, friday   Yes [provider]  furosemide (LASIX) 40 MG tablet Take 40 mg 2 (two) times daily by mouth.  09/09/17  Yes [provider]  losartan (COZAAR) 25 MG tablet Take 1 tablet (25 mg total) by mouth at bedtime. Patient taking differently: Take 25 mg daily by mouth.  04/02/17  Yes Dhungel, Nishant, MD  magnesium oxide (MAG-OX) 400 (241.3 Mg) MG tablet Take 400 mg by mouth. Twice a month   Yes [provider]  nitroGLYCERIN (NITROSTAT) 0.4 MG SL tablet Place 0.4 mg under the tongue every 5 (five) minutes as needed for chest pain.   Yes [provider]  potassium chloride SA (K-DUR,KLOR-CON) 20 MEQ tablet Take 1 tablet (20 mEq total) by mouth daily. 04/13/17  Yes Graciella Freer, PA-C  spironolactone (ALDACTONE) 25 MG tablet Take 1 tablet (25 mg total) by mouth daily. 04/13/17  Yes Graciella Freer, PA-C  ticagrelor (BRILINTA) 60 MG TABS tablet Take 1 tablet (60 mg total) by mouth 2 (two) times daily. Patient taking differently: Take 60 mg daily by mouth.  04/02/17  Yes Dhungel, Nishant, MD  torsemide (DEMADEX) 20 MG tablet Take 3 tablets (60 mg total) by mouth daily. 04/13/17   Graciella Freer, PA-C    Family History Family History  Problem Relation Age of Onset  . Diabetes Mother   . Hypertension Mother     Social History Social History   Tobacco Use  . Smoking status: Current Some Day Smoker    Packs/day: 0.10    Years: 0.50    Pack years: 0.05    Types: Cigarettes  . Smokeless tobacco: Current User  . Tobacco comment: down to 3 cigarettes daily (08/03/14)  Substance Use Topics  . Alcohol use: No  . Drug use: No     Allergies   Aspirin; Effient [prasugrel]; Entresto [sacubitril-valsartan]; Lactose intolerance (gi); Robitussin dm [guaifenesin-dm]; Sulfa antibiotics; Other; and Wheat bran   Review of Systems Review of Systems  Constitutional: Negative for chills and fever.  HENT:  Negative.   Respiratory: Positive for cough. Negative for shortness of breath.   Cardiovascular: Positive for leg swelling. Negative for chest pain.  Gastrointestinal: Positive for abdominal pain (Diffuse pain and swelling) and vomiting. Negative for nausea.  Genitourinary: Negative.  Negative for dysuria and frequency.  Musculoskeletal: Negative.  Negative for myalgias.  Skin: Negative.  Negative for wound.  Neurological: Negative.  Negative for syncope, weakness and light-headedness.     Physical Exam Updated Vital Signs BP (!) 154/115   Pulse 92   Temp 97.6 F (36.4 C) (Oral)   Resp (!) 21   Ht 5\' 6"  (1.676 m)   Wt 104.3 kg (230 lb)   SpO2 100%   BMI 37.12 kg/m   Physical Exam  Constitutional: She is oriented to person, place, and time. She appears well-developed and  well-nourished. No distress.  HENT:  Head: Normocephalic.  Neck: Normal range of motion. Neck supple.  Cardiovascular: Regular rhythm. Tachycardia present.  Pulmonary/Chest: Effort normal. No respiratory distress. She has rales. She exhibits no tenderness.  Abdominal: Soft. Bowel sounds are normal. There is no tenderness. There is no rebound and no guarding.  Musculoskeletal: Normal range of motion. She exhibits edema and tenderness.  Significant bilateral LE pitting edema. There is lower leg skin thickening suggesting chronic stasis.  Neurological: She is alert and oriented to person, place, and time.  Skin: Skin is warm and dry. No rash noted.  Psychiatric: She has a normal mood and affect.     ED Treatments / Results  Labs (all labs ordered are listed, but only abnormal results are displayed) Labs Reviewed  BASIC METABOLIC PANEL - Abnormal; Notable for the following components:      Result Value   Glucose, Bld 102 (*)    Creatinine, Ser 1.23 (*)    GFR calc non Af Amer 45 (*)    GFR calc Af Amer 52 (*)    All other components within normal limits  CBC - Abnormal; Notable for the following  components:   RBC 5.56 (*)    HCT 46.8 (*)    RDW 16.4 (*)    All other components within normal limits  I-STAT TROPONIN, ED   Results for orders placed or performed during the hospital encounter of 09/22/17  Basic metabolic panel  Result Value Ref Range   Sodium 142 135 - 145 mmol/L   Potassium 3.6 3.5 - 5.1 mmol/L   Chloride 107 101 - 111 mmol/L   CO2 25 22 - 32 mmol/L   Glucose, Bld 102 (H) 65 - 99 mg/dL   BUN 15 6 - 20 mg/dL   Creatinine, Ser 6.14 (H) 0.44 - 1.00 mg/dL   Calcium 9.2 8.9 - 70.9 mg/dL   GFR calc non Af Amer 45 (L) >60 mL/min   GFR calc Af Amer 52 (L) >60 mL/min   Anion gap 10 5 - 15  CBC  Result Value Ref Range   WBC 8.6 4.0 - 10.5 K/uL   RBC 5.56 (H) 3.87 - 5.11 MIL/uL   Hemoglobin 14.6 12.0 - 15.0 g/dL   HCT 29.5 (H) 74.7 - 34.0 %   MCV 84.2 78.0 - 100.0 fL   MCH 26.3 26.0 - 34.0 pg   MCHC 31.2 30.0 - 36.0 g/dL   RDW 37.0 (H) 96.4 - 38.3 %   Platelets 298 150 - 400 K/uL  I-stat troponin, ED  Result Value Ref Range   Troponin i, poc 0.02 0.00 - 0.08 ng/mL   Comment 3             EKG  EKG Interpretation None       Radiology Dg Chest 2 View  Result Date: 09/22/2017 CLINICAL DATA:  CHF, swelling. Pt. Has swelling from chest and abdomen through both knees x 2 days. No chest pain or SOB. Hx of CHF EXAM: CHEST  2 VIEW COMPARISON:  03/26/2017 FINDINGS: Heart is mildly enlarged and stable in configuration. There is persistent right pleural effusion or pleural thickening and right basilar atelectasis or scarring, stable in appearance. There are no focal consolidations. No pulmonary edema. IMPRESSION: 1. Stable cardiomegaly.  No edema. 2. Stable changes at the right lung base, compatible with scarring and pleural thickening. Electronically Signed   By: Norva Pavlov M.D.   On: 09/22/2017 16:56    Procedures Procedures (including  critical care time)  Medications Ordered in ED Medications - No data to display   Initial Impression / Assessment and  Plan / ED Course  I have reviewed the triage vital signs and the nursing notes.  Pertinent labs & imaging results that were available during my care of the patient were reviewed by me and considered in my medical decision making (see chart for details).     Patient with a history of CHF, ischemic CM, EF between 20-40% presents with symptoms of CHF exacerbation including bilateral LE edema, Rales bilateral chest. History of noncompliance with medications and diet.   Troponin, EKG are negative/nonacute for ischemic change. CXR negative for acute EDema. No suspicion for clot. She is clinically fluid overloaded and will need inpatient diuresis.   She has had a cough causing vomiting. Tussionex and Albuterol provided with relief of these symptoms. IV Lasix ordered.   Discussed the patient with TRH, Dr. Antionette Char, who accepts the patient onto his service for treatment of fluid overload.  Final Clinical Impressions(s) / ED Diagnoses   Final diagnoses:  None   1. CHF exacerbation 2. Cough 3. Hypertension   ED Discharge Orders    None       Elpidio Anis, PA-C 09/23/17 0100    Tegeler, Canary Brim, MD 09/23/17 [phone]

## 2017-09-23 ENCOUNTER — Encounter (HOSPITAL_COMMUNITY): Payer: Self-pay | Admitting: Family Medicine

## 2017-09-23 ENCOUNTER — Inpatient Hospital Stay (HOSPITAL_COMMUNITY): Payer: Medicare Other

## 2017-09-23 DIAGNOSIS — Z888 Allergy status to other drugs, medicaments and biological substances status: Secondary | ICD-10-CM | POA: Diagnosis not present

## 2017-09-23 DIAGNOSIS — I255 Ischemic cardiomyopathy: Secondary | ICD-10-CM

## 2017-09-23 DIAGNOSIS — E876 Hypokalemia: Secondary | ICD-10-CM | POA: Diagnosis present

## 2017-09-23 DIAGNOSIS — Z8249 Family history of ischemic heart disease and other diseases of the circulatory system: Secondary | ICD-10-CM | POA: Diagnosis not present

## 2017-09-23 DIAGNOSIS — I340 Nonrheumatic mitral (valve) insufficiency: Secondary | ICD-10-CM | POA: Diagnosis not present

## 2017-09-23 DIAGNOSIS — I509 Heart failure, unspecified: Secondary | ICD-10-CM

## 2017-09-23 DIAGNOSIS — N183 Chronic kidney disease, stage 3 (moderate): Secondary | ICD-10-CM

## 2017-09-23 DIAGNOSIS — F17210 Nicotine dependence, cigarettes, uncomplicated: Secondary | ICD-10-CM | POA: Diagnosis present

## 2017-09-23 DIAGNOSIS — Z9114 Patient's other noncompliance with medication regimen: Secondary | ICD-10-CM | POA: Diagnosis not present

## 2017-09-23 DIAGNOSIS — Z6834 Body mass index (BMI) 34.0-34.9, adult: Secondary | ICD-10-CM | POA: Diagnosis not present

## 2017-09-23 DIAGNOSIS — I5043 Acute on chronic combined systolic (congestive) and diastolic (congestive) heart failure: Secondary | ICD-10-CM | POA: Diagnosis present

## 2017-09-23 DIAGNOSIS — Z90711 Acquired absence of uterus with remaining cervical stump: Secondary | ICD-10-CM | POA: Diagnosis not present

## 2017-09-23 DIAGNOSIS — E739 Lactose intolerance, unspecified: Secondary | ICD-10-CM | POA: Diagnosis present

## 2017-09-23 DIAGNOSIS — I081 Rheumatic disorders of both mitral and tricuspid valves: Secondary | ICD-10-CM | POA: Diagnosis present

## 2017-09-23 DIAGNOSIS — I2510 Atherosclerotic heart disease of native coronary artery without angina pectoris: Secondary | ICD-10-CM | POA: Diagnosis present

## 2017-09-23 DIAGNOSIS — I10 Essential (primary) hypertension: Secondary | ICD-10-CM

## 2017-09-23 DIAGNOSIS — Z91018 Allergy to other foods: Secondary | ICD-10-CM | POA: Diagnosis not present

## 2017-09-23 DIAGNOSIS — I252 Old myocardial infarction: Secondary | ICD-10-CM | POA: Diagnosis not present

## 2017-09-23 DIAGNOSIS — Z886 Allergy status to analgesic agent status: Secondary | ICD-10-CM | POA: Diagnosis not present

## 2017-09-23 DIAGNOSIS — Z7902 Long term (current) use of antithrombotics/antiplatelets: Secondary | ICD-10-CM | POA: Diagnosis not present

## 2017-09-23 DIAGNOSIS — Z955 Presence of coronary angioplasty implant and graft: Secondary | ICD-10-CM | POA: Diagnosis not present

## 2017-09-23 DIAGNOSIS — I130 Hypertensive heart and chronic kidney disease with heart failure and stage 1 through stage 4 chronic kidney disease, or unspecified chronic kidney disease: Secondary | ICD-10-CM | POA: Diagnosis present

## 2017-09-23 DIAGNOSIS — Z9119 Patient's noncompliance with other medical treatment and regimen: Secondary | ICD-10-CM | POA: Diagnosis not present

## 2017-09-23 DIAGNOSIS — E669 Obesity, unspecified: Secondary | ICD-10-CM | POA: Diagnosis present

## 2017-09-23 DIAGNOSIS — E785 Hyperlipidemia, unspecified: Secondary | ICD-10-CM | POA: Diagnosis present

## 2017-09-23 DIAGNOSIS — Z882 Allergy status to sulfonamides status: Secondary | ICD-10-CM | POA: Diagnosis not present

## 2017-09-23 LAB — ECHOCARDIOGRAM COMPLETE
Height: 65 in
Weight: 3720 oz

## 2017-09-23 MED ORDER — ALPRAZOLAM 0.25 MG PO TABS: 0.2500 mg | ORAL_TABLET | Freq: Two times a day (BID) | ORAL | Status: DC | PRN

## 2017-09-23 MED ORDER — HYDROCODONE-ACETAMINOPHEN 5-325 MG PO TABS
1.0000 | ORAL_TABLET | ORAL | Status: DC | PRN
Start: 2017-09-23 — End: 2017-09-29
  Administered 2017-09-23 – 2017-09-26 (×2): 2 via ORAL
  Administered 2017-09-27 – 2017-09-29 (×3): 1 via ORAL
  Filled 2017-09-23 (×3): qty 1
  Filled 2017-09-23 (×2): qty 2

## 2017-09-23 MED ORDER — FUROSEMIDE 10 MG/ML IJ SOLN
80.0000 mg | Freq: Two times a day (BID) | INTRAMUSCULAR | Status: DC
  Administered 2017-09-23 – 2017-09-26 (×6): 80 mg via INTRAVENOUS
  Filled 2017-09-23 (×6): qty 8

## 2017-09-23 MED ORDER — FUROSEMIDE 10 MG/ML IJ SOLN
40.0000 mg | Freq: Two times a day (BID) | INTRAMUSCULAR | Status: DC
  Administered 2017-09-23: 40 mg via INTRAVENOUS
  Filled 2017-09-23: qty 4

## 2017-09-23 MED ORDER — HYDRALAZINE HCL 20 MG/ML IJ SOLN: 10.0000 mg | INTRAMUSCULAR | Status: DC | PRN

## 2017-09-23 MED ORDER — ONDANSETRON HCL 4 MG/2ML IJ SOLN: 4.0000 mg | Freq: Four times a day (QID) | INTRAMUSCULAR | Status: DC | PRN

## 2017-09-23 MED ORDER — TICAGRELOR 60 MG PO TABS
60.0000 mg | ORAL_TABLET | Freq: Two times a day (BID) | ORAL | Status: DC
  Administered 2017-09-23 – 2017-09-29 (×13): 60 mg via ORAL
  Filled 2017-09-23 (×13): qty 1

## 2017-09-23 MED ORDER — ATORVASTATIN CALCIUM 80 MG PO TABS
80.0000 mg | ORAL_TABLET | Freq: Every day | ORAL | Status: DC
  Administered 2017-09-23 – 2017-09-28 (×6): 80 mg via ORAL
  Filled 2017-09-23 (×7): qty 1

## 2017-09-23 MED ORDER — ALBUTEROL SULFATE (2.5 MG/3ML) 0.083% IN NEBU: 2.5000 mg | INHALATION_SOLUTION | Freq: Four times a day (QID) | RESPIRATORY_TRACT | Status: DC | PRN

## 2017-09-23 MED ORDER — ACETAMINOPHEN 325 MG PO TABS: 650.0000 mg | ORAL_TABLET | ORAL | Status: DC | PRN

## 2017-09-23 MED ORDER — SODIUM CHLORIDE 0.9% FLUSH
3.0000 mL | Freq: Two times a day (BID) | INTRAVENOUS | Status: DC
  Administered 2017-09-23 – 2017-09-29 (×12): 3 mL via INTRAVENOUS

## 2017-09-23 MED ORDER — SODIUM CHLORIDE 0.9 % IV SOLN: 250.0000 mL | INTRAVENOUS | Status: DC | PRN

## 2017-09-23 MED ORDER — SODIUM CHLORIDE 0.9% FLUSH: 3.0000 mL | INTRAVENOUS | Status: DC | PRN

## 2017-09-23 MED ORDER — METOLAZONE 5 MG PO TABS
2.5000 mg | ORAL_TABLET | Freq: Once | ORAL | Status: AC
  Administered 2017-09-23: 2.5 mg via ORAL
  Filled 2017-09-23: qty 1

## 2017-09-23 MED ORDER — ENOXAPARIN SODIUM 40 MG/0.4ML ~~LOC~~ SOLN
40.0000 mg | SUBCUTANEOUS | Status: DC
Start: 2017-09-23 — End: 2017-09-29
  Administered 2017-09-24 – 2017-09-26 (×2): 40 mg via SUBCUTANEOUS
  Filled 2017-09-23 (×4): qty 0.4

## 2017-09-23 MED ORDER — LOSARTAN POTASSIUM 25 MG PO TABS
25.0000 mg | ORAL_TABLET | Freq: Every day | ORAL | Status: DC
  Administered 2017-09-23: 25 mg via ORAL
  Filled 2017-09-23: qty 1

## 2017-09-23 MED ORDER — SPIRONOLACTONE 25 MG PO TABS
25.0000 mg | ORAL_TABLET | Freq: Every day | ORAL | Status: DC
  Administered 2017-09-23 – 2017-09-29 (×7): 25 mg via ORAL
  Filled 2017-09-23 (×7): qty 1

## 2017-09-23 MED ORDER — LOSARTAN POTASSIUM 50 MG PO TABS
50.0000 mg | ORAL_TABLET | Freq: Every day | ORAL | Status: DC
  Administered 2017-09-24: 50 mg via ORAL
  Filled 2017-09-23: qty 1

## 2017-09-23 MED ORDER — FERROUS SULFATE 325 (65 FE) MG PO TABS
325.0000 mg | ORAL_TABLET | Freq: Three times a day (TID) | ORAL | Status: DC
  Administered 2017-09-23 – 2017-09-29 (×20): 325 mg via ORAL
  Filled 2017-09-23 (×22): qty 1

## 2017-09-23 MED ORDER — MAGNESIUM OXIDE 400 (241.3 MG) MG PO TABS
400.0000 mg | ORAL_TABLET | Freq: Two times a day (BID) | ORAL | Status: DC
  Administered 2017-09-23 – 2017-09-29 (×13): 400 mg via ORAL
  Filled 2017-09-23 (×13): qty 1

## 2017-09-23 MED ORDER — PERFLUTREN LIPID MICROSPHERE
1.0000 mL | INTRAVENOUS | Status: AC | PRN
  Administered 2017-09-23: 2 mL via INTRAVENOUS
  Filled 2017-09-23: qty 10

## 2017-09-23 MED ORDER — LORATADINE 10 MG PO TABS
10.0000 mg | ORAL_TABLET | Freq: Every day | ORAL | Status: DC
  Administered 2017-09-23 – 2017-09-29 (×7): 10 mg via ORAL
  Filled 2017-09-23 (×7): qty 1

## 2017-09-23 MED ORDER — CARVEDILOL 3.125 MG PO TABS
3.1250 mg | ORAL_TABLET | ORAL | Status: DC
  Administered 2017-09-23: 3.125 mg via ORAL
  Filled 2017-09-23: qty 1

## 2017-09-23 MED ORDER — POTASSIUM CHLORIDE CRYS ER 20 MEQ PO TBCR
20.0000 meq | EXTENDED_RELEASE_TABLET | Freq: Every day | ORAL | Status: DC
  Administered 2017-09-23 – 2017-09-24 (×2): 20 meq via ORAL
  Filled 2017-09-23 (×2): qty 1

## 2017-09-23 NOTE — Consult Note (Signed)
Cardiology Consultation:   Patient ID: Deborah Ho; [id number]; [birth year]   Admit date: 09/22/2017 Date of Consult: 09/23/2017  Primary Care Provider: Grayce Sessions, NP Primary Cardiologist: Dr. Shirlee Latch Primary Electrophysiologist:     Patient Profile:   Deborah Ho is a 66 y.o. female with a hx of ischemic cardiomyopathy, chronic systolic and diastolic heart failure, HTN, HLD, tobacco use, CAD s/p anterior STEMI (2015) with early stent thrombosis 2/ missing doses of brilinta s/p PCI x 2 to LAD (04/2014), medical noncompliance, GERD, and CKD stage III who is being seen today for the evaluation of acute on chronic systolic and diastolic heart failure at the request of Dr. Mahala Menghini.  History of Present Illness:   Deborah Ho has had several admissions recently for heart failure exacerbation.  She was admitted to [facility] on 3/16 for CHF and right pleural effusion.  She underwent thoracentesis which showed transudate of effusion.  Charge weight was 183 pounds at that time.  She was admitted again on 4/16 with CHF secondary to dietary and medical noncompliance.  She was diuresed and discharged.  She was again admitted in 02/2016 with volume overload secondary to noncompliance on her diuretic at home.  She was diuresed and discharged with a weight of 195 pounds.  She was admitted in August 2017 with CHF exacerbation she was discharged with a weight of 190 pounds and referred to para medicine.  She was admitted in March 2018 with combined congestive heart failure.  Her discharge weight was 198 pounds.  He is again admitted in May 2018 for CHF exacerbation.    She has declined an ICD in the past.  He has also refused to try Entresto in the past.  She also refused paramedicine.  She was last discharged 04/02/17 on 60 mg daily torsemide, 60 mg twice daily Brilinta, 25 mg daily spironolactone, 25 mg daily losartan, Lasix 25 mg twice daily Coreg, 80 mg Lipitor.   She presented to [facility] on 09/22/17 with lower extremity swelling and cough.  In the ED she was saturating well on room air but slightly tachypneic.  EKG with normal sinus rhythm chest x-ray with stable cardiomegaly.   On my review, she states that yesterday she experienced increased swelling in her abdomen and upper thighs.  This is consistent with her normal fluid retention during heart failure exacerbations.  She states that she has been compliant on all medications.  She denies chest pain, palpitations, dizziness, and syncope.  She also states that she had soreness in her upper thighs yesterday, the soreness has relieved with IV diuresis.   Past Medical History:  Diagnosis Date  . CAD (coronary artery disease) 05/03/14; 05/09/14   a. anterior STEMI with early in-stent thorombosis for missed dose of Brillinta s/p PCI with DESx 2 into LAD (04/2014)  . CHF (congestive heart failure) (HCC)   . GERD (gastroesophageal reflux disease)    Probable  . HTN (hypertension)   . Hyperlipidemia   . Ischemic cardiomyopathy    a. 04/2014 ECHO with EF 45-50% b.  Repeat 2D echo 08/14/14 with EF down at 15%. Life vest placed  . Non compliance w medication regimen   . Tobacco use     Past Surgical History:  Procedure Laterality Date  . CHOLECYSTECTOMY    . CORONARY ANGIOPLASTY WITH STENT PLACEMENT  05/03/14   STEMI- stent to LAD DES- Xience alpine  . CORONARY ANGIOPLASTY WITH STENT PLACEMENT  05/09/14   STEMI- overlapping stent to LAD,  pt had missed a dose of Brilinta  . PARTIAL HYSTERECTOMY       Home Medications:  Prior to Admission medications   Medication Sig Start Date End Date Taking? Authorizing Provider  atorvastatin (LIPITOR) 80 MG tablet Take 1 tablet (80 mg total) by mouth daily at 6 PM. 07/06/16  Yes Johnson, Clanford L, MD  carvedilol (COREG) 3.125 MG tablet Take 3.125 mg every other day by mouth.    Yes [provider]  cetirizine (ZYRTEC) 10 MG tablet Take 10 mg daily by mouth.    Yes [provider]  cholecalciferol (VITAMIN D) 1000 units tablet Take 1,000 Units by mouth. Twice a month   Yes [provider]  ferrous sulfate 325 (65 FE) MG tablet Take 325 mg 3 (three) times daily by mouth. Monday,Wednesday, friday   Yes [provider]  furosemide (LASIX) 40 MG tablet Take 40 mg 2 (two) times daily by mouth.  09/09/17  Yes [provider]  losartan (COZAAR) 25 MG tablet Take 1 tablet (25 mg total) by mouth at bedtime. Patient taking differently: Take 25 mg daily by mouth.  04/02/17  Yes Dhungel, Nishant, MD  magnesium oxide (MAG-OX) 400 (241.3 Mg) MG tablet Take 400 mg by mouth. Twice a month   Yes [provider]  nitroGLYCERIN (NITROSTAT) 0.4 MG SL tablet Place 0.4 mg under the tongue every 5 (five) minutes as needed for chest pain.   Yes [provider]  potassium chloride SA (K-DUR,KLOR-CON) 20 MEQ tablet Take 1 tablet (20 mEq total) by mouth daily. 04/13/17  Yes Graciella Freer, PA-C  spironolactone (ALDACTONE) 25 MG tablet Take 1 tablet (25 mg total) by mouth daily. 04/13/17  Yes Graciella Freer, PA-C  ticagrelor (BRILINTA) 60 MG TABS tablet Take 1 tablet (60 mg total) by mouth 2 (two) times daily. Patient taking differently: Take 60 mg daily by mouth.  04/02/17  Yes Dhungel, Nishant, MD    Inpatient Medications: Scheduled Meds: . atorvastatin  80 mg Oral q1800  . carvedilol  3.125 mg Oral QODAY  . enoxaparin (LOVENOX) injection  40 mg Subcutaneous Q24H  . ferrous sulfate  325 mg Oral TID WC  . furosemide  40 mg Intravenous Q12H  . loratadine  10 mg Oral Daily  . losartan  25 mg Oral Daily  . magnesium oxide  400 mg Oral BID  . potassium chloride SA  20 mEq Oral Daily  . sodium chloride flush  3 mL Intravenous Q12H  . spironolactone  25 mg Oral Daily  . ticagrelor  60 mg Oral BID   Continuous Infusions: . sodium chloride     PRN Meds: sodium chloride, acetaminophen, albuterol, ALPRAZolam, hydrALAZINE,  HYDROcodone-acetaminophen, ondansetron (ZOFRAN) IV, perflutren lipid microspheres (DEFINITY) IV suspension, sodium chloride flush  Allergies:    Allergies  Allergen Reactions  . Aspirin Swelling    Chewable children's aspirin makes patients tongue and face swell  . Effient [Prasugrel] Swelling    Patient's tongue and face swells  . Entresto [Sacubitril-Valsartan] Other (See Comments)    dizziness  . Lactose Intolerance (Gi) Other (See Comments)    REACTION: stomach upset  . Robitussin Dm [Guaifenesin-Dm] Swelling    Patient's tongue swells  . Sulfa Antibiotics Swelling  . Other     Had to replace "catgut" with clamps  . Wheat Bran Other (See Comments)    REACTION: unknown    Social History:   Social History   Socioeconomic History  . Marital status: Widowed  Spouse name: Not on file  . Number of children: Not on file  . Years of education: Not on file  . Highest education level: Not on file  Social Needs  . Financial resource strain: Not on file  . Food insecurity - worry: Not on file  . Food insecurity - inability: Not on file  . Transportation needs - medical: Not on file  . Transportation needs - non-medical: Not on file  Occupational History  . Occupation: not employed  Tobacco Use  . Smoking status: Current Some Day Smoker    Packs/day: 0.10    Years: 0.50    Pack years: 0.05    Types: Cigarettes  . Smokeless tobacco: Current User  . Tobacco comment: down to 3 cigarettes daily (08/03/14)  Substance and Sexual Activity  . Alcohol use: No  . Drug use: No  . Sexual activity: Not on file  Other Topics Concern  . Not on file  Social History Narrative   Patient has 6 brothers and sisters and none have known coronary artery disease. She lives alone in [place], but has several brothers in the area.    Family History:    Family History  Problem Relation Age of Onset  . Diabetes Mother   . Hypertension Mother      ROS:  Please see the history of present  illness.  ROS  All other ROS reviewed and negative.     Physical Exam/Data:   Vitals:   09/23/17 0600 09/23/17 0630 09/23/17 1030 09/23/17 1451  BP: 130/84  132/79 133/80  Pulse: 86  84 86  Resp: (!) 21     Temp:  (!) 97.5 F (36.4 C)  98.1 F (36.7 C)  TempSrc:  Axillary  Oral  SpO2: 96%   100%  Weight:  232 lb 8 oz (105.5 kg)    Height:  5\' 5"  (1.651 m)      Intake/Output Summary (Last 24 hours) at 09/23/2017 1559 Last data filed at 09/23/2017 1005 Gross per 24 hour  Intake 713 ml  Output 1050 ml  Net -337 ml   Filed Weights   09/22/17 1614 09/23/17 0630  Weight: 230 lb (104.3 kg) 232 lb 8 oz (105.5 kg)   Body mass index is 38.69 kg/m.  General:  Well nourished, well developed, in no acute distress HEENT: normal Neck: no JVD Vascular: No carotid bruits Cardiac:  normal S1, S2; RRR; no murmur  Lungs:  clear to auscultation bilaterally, no wheezing, respirations unlabored Abd: soft, nontender but noted guarding, no hepatomegaly  Ext:  1+ edema in her thighs Musculoskeletal:  No deformities, BUE and BLE strength normal and equal Skin: warm and dry  Neuro:  CNs 2-12 intact, no focal abnormalities noted Psych:  Normal affect   EKG:  The EKG was personally reviewed and demonstrates:  Sinus rhythm Telemetry:  Telemetry was personally reviewed and demonstrates:  Sinus   Relevant CV Studies:  Echo 09/23/17: pending read   Echo 02/01/17: Study Conclusions - Left ventricle: The cavity size was mildly dilated. There was   mild focal basal hypertrophy of the septum. Systolic function was   severely reduced. The estimated ejection fraction was in the   range of 20% to 25%. Diffuse hypokinesis. Akinesis of the   apicalanteroseptal myocardium. Doppler parameters are consistent   with a reversible restrictive pattern, indicative of decreased   left ventricular diastolic compliance and/or increased left   atrial pressure (grade 3 diastolic dysfunction). Doppler    parameters are  consistent with high ventricular filling pressure. - Aortic valve: Transvalvular velocity was within the normal range.   There was no stenosis. There was no regurgitation. - Mitral valve: Transvalvular velocity was within the normal range.   There was no evidence for stenosis. There was mild regurgitation. - Left atrium: The atrium was mildly dilated. - Right ventricle: The cavity size was moderately dilated. Wall   thickness was normal. Systolic function was moderately reduced. - Tricuspid valve: There was mild-moderate regurgitation. - Pulmonary arteries: Systolic pressure was within the normal   range. PA peak pressure: 30 mm Hg (S).   Laboratory Data:  Chemistry Recent Labs  Lab 09/22/17 1625  NA 142  K 3.6  CL 107  CO2 25  GLUCOSE 102*  BUN 15  CREATININE 1.23*  CALCIUM 9.2  GFRNONAA 45*  GFRAA 52*  ANIONGAP 10    No results for input(s): PROT, ALBUMIN, AST, ALT, ALKPHOS, BILITOT in the last 168 hours. Hematology Recent Labs  Lab 09/22/17 1625  WBC 8.6  RBC 5.56*  HGB 14.6  HCT 46.8*  MCV 84.2  MCH 26.3  MCHC 31.2  RDW 16.4*  PLT 298   Cardiac EnzymesNo results for input(s): TROPONINI in the last 168 hours.  Recent Labs  Lab 09/22/17 1650  TROPIPOC 0.02    BNPNo results for input(s): BNP, PROBNP in the last 168 hours.  DDimer No results for input(s): DDIMER in the last 168 hours.  Radiology/Studies:  Dg Chest 2 View  Result Date: 09/22/2017 CLINICAL DATA:  CHF, swelling. Pt. Has swelling from chest and abdomen through both knees x 2 days. No chest pain or SOB. Hx of CHF EXAM: CHEST  2 VIEW COMPARISON:  03/26/2017 FINDINGS: Heart is mildly enlarged and stable in configuration. There is persistent right pleural effusion or pleural thickening and right basilar atelectasis or scarring, stable in appearance. There are no focal consolidations. No pulmonary edema. IMPRESSION: 1. Stable cardiomegaly.  No edema. 2. Stable changes at the right lung  base, compatible with scarring and pleural thickening. Electronically Signed   By: Norva Pavlov M.D.   On: 09/22/2017 16:56    Assessment and Plan:   1. Acute on chronic systolic and diastolic heart failure - recent echo with LVEF of 20-25% and grade 3 DD - repeat echo pending - she has a long history of noncompliance - continue with home medications:  - primary team started 40 mg IV lasix with 2.5 mg zaroxolyn - she is diuresing well on this regimen, but will likely require a higher dose of lasix tomorrow - will consider 60 mg IV lasix - she is overall net negative 150 cc, with 700 cc urine output yesterday.  She will likely require daily Zaroxolyn while hospitalized. -I spoke with her about Entresto.  She states that she tried Entresto in the past and became profoundly dizzy.  I reinforced that this would be a good medication for her given her many hospitalizations for exacerbation.  She is willing to try a low dose of Entresto while hospitalized.   2. CAD, s/p PCI - no chest pain - troponin x 1 negative - continue lipitor   3. CKD stage III -Serum creatinine was 1.23 on admission, no labs collected today - baseline creatinine appears to be 1.5-1.7 -Follow daily labs    For questions or updates, please contact [facility] Please consult [email or web address] for contact info under Cardiology/STEMI.   Signed, Roe Rutherford Duke, PA  09/23/2017 3:59 PM As above, patient seen and  examined. Briefly she is a 66 year old female with past medical history of ischemic cardiomyopathy, coronary artery disease, combined chronic systolic/diastolic congestive heart failure, chronic stage III kidney disease, hypertension, hyperlipidemia, noncompliance with acute on chronic systolic congestive heart failure. Patient presents with complaints of orthopnea associated with cough. She has gained 45 pounds from her dry weight by her report. She notes increasing lower extremity edema and abdominal girth.  She denies chest pain. She has had a recent cold but denies fevers, chills. Her cough is productive of clear sputum. She has been admitted and cardiology asked to evaluate. Physical exam shows marked volume excess with increased abdominal girth and 3-4+ lower extremity edema. Creatinine 1.23. Electrocardiogram shows sinus rhythm, inferior infarct, anterior infarct and low voltage. Echocardiogram shows ejection fraction 20-25%, mild to moderate mitral regurgitation, dilated RV with reduced function and moderate tricuspid regurgitation.  1 acute on chronic systolic congestive heart failure-patient is markedly volume overloaded on examination. I will increase Lasix to 80 mg IV twice a day. Continue spironolactone. Follow renal function closely with diuresis. Patient instructed on low sodium diet and fluid restriction. We also discussed daily weights and need for additional diuretic for weight gain of 2-3 pounds. Not clear to me that she is wearing daily at home.  2 ischemic cardiomyopathy-given acute presentation of CHF will hold carvedilol for now. Will resume when CHF improves and titrate as tolerated by pulse and blood pressure. Increase Cozaar to 50 mg daily. She apparently felt entresto was causing dizziness as an outpatient. However this could be reattempted following discharge.  3 coronary artery disease-patient has an aspirin allergy. Continue Brilinta and statin.   4 chronic stage III kidney disease-monitor renal function with diuresis.  5 hypertension-follow blood pressure and advance medications as needed.  6 Hyperlipidemia-continue statin.    Olga Millers, MD

## 2017-09-23 NOTE — ED Notes (Signed)
Attempted to gain IV access x2, without success.  

## 2017-09-23 NOTE — Progress Notes (Signed)
I agree with HPI and A+P per Dr. Myna Hidalgo  66 ? EF 20-25% [has declined ICD in past and Entresto], Reflux, HLD,HTn, CAD STEMi 2330 complicated by stent thrombosis from noncompliance with Ticagrelor, Elevated total bili/alk phos ? Cause, CKD III Base weight on last d/c prior to home 86 kg  Readmitted with vol overload and wght 105 kg--states weight at Primary Md was the same as home weights however Admits to indiscretion with diet and eating more salt than usual Not able to take brilnta daily Tells me is taking lasix 40 bid as scheudled but is not on Aldactone at home  O/e BP 130/84   Pulse 86   Temp (!) 97.5 F (36.4 C) (Axillary)   Resp (!) 21   Ht _0  (1.651 m)   Wt 105.5 kg (232 lb 8 oz)   SpO2 96%   BMI 38.69 kg/m  Obese [place] Cannot assess for jvd No rales no rhonchi s1 s 2 slight tachy abd obese with anasarca and upper thighs and LE swollen  P Will ask ACHF team to see Diurese with Lasix 40 bid--Adding Metolazone 2.5 in a, Might need to cut back coreg 3.125 tyo facilitate diuresis Suspect given her multiple inolerances to meds and on-compliance on-going CHF control will be an issue   Verneita Griffes, MD [facility] [phone]

## 2017-09-23 NOTE — Progress Notes (Signed)
  Echocardiogram 2D Echocardiogram has been performed.  Janalyn Harder 09/23/2017, 3:50 PM

## 2017-09-23 NOTE — ED Notes (Signed)
IV Team at bedside 

## 2017-09-23 NOTE — ED Notes (Signed)
Attempted report x1. 

## 2017-09-23 NOTE — H&P (Signed)
History and Physical    Deborah Ho AOZ:[id number] DOB: 1951/10/29 DOA: 09/22/2017  PCP: Grayce Sessions, NP   Patient coming from: Home  Chief Complaint: Swelling, cough  HPI: Deborah Ho is a 66 y.o. female with medical history significant for coronary artery disease, chronic kidney disease stage III, and chronic combined systolic/diastolic CHF, now presenting to the emergency department for evaluation of swelling and dyspnea.  Patient reports noting the insidious development of edema throughout her legs and abdomen, as well as nonproductive cough, over the past weeks, worsening over the past few days.  Denies chest pain.  Reports that she has had similar episodes that were attributed to her CHF.  No fevers or chills.  ED Course: Upon arrival to the ED, patient is found to be afebrile, saturating well on room air, slightly tachypneic, and with vitals otherwise stable.  EKG features of normal sinus rhythm and chest x-ray is notable for stable cardiomegaly.  Chemistry panel reveals a serum creatinine 1.23, consistent with her apparent baseline.  CBC is unremarkable and troponin is within normal limits.  Patient was treated with 40 mg IV Lasix and albuterol she remained hemodynamically stable, is not in acute respiratory distress, and will be admitted to the telemetry unit for ongoing evaluation and management of acute on chronic combined CHF.  Review of Systems:  All other systems reviewed and apart from HPI, are negative.  Past Medical History:  Diagnosis Date  . CAD (coronary artery disease) 05/03/14; 05/09/14   a. anterior STEMI with early in-stent thorombosis for missed dose of Brillinta s/p PCI with DESx 2 into LAD (04/2014)  . CHF (congestive heart failure) (HCC)   . GERD (gastroesophageal reflux disease)    Probable  . HTN (hypertension)   . Hyperlipidemia   . Ischemic cardiomyopathy    a. 04/2014 ECHO with EF 45-50% b.  Repeat 2D echo 08/14/14 with EF down at 15%. Life vest  placed  . Non compliance w medication regimen   . Tobacco use     Past Surgical History:  Procedure Laterality Date  . CHOLECYSTECTOMY    . CORONARY ANGIOPLASTY WITH STENT PLACEMENT  05/03/14   STEMI- stent to LAD DES- Xience alpine  . CORONARY ANGIOPLASTY WITH STENT PLACEMENT  05/09/14   STEMI- overlapping stent to LAD, pt had missed a dose of Brilinta  . PARTIAL HYSTERECTOMY       reports that she has been smoking cigarettes.  She has a 0.05 pack-year smoking history. She uses smokeless tobacco. She reports that she does not drink alcohol or use drugs.  Allergies  Allergen Reactions  . Aspirin Swelling    Chewable children's aspirin makes patients tongue and face swell  . Effient [Prasugrel] Swelling    Patient's tongue and face swells  . Entresto [Sacubitril-Valsartan] Other (See Comments)    dizziness  . Lactose Intolerance (Gi) Other (See Comments)    REACTION: stomach upset  . Robitussin Dm [Guaifenesin-Dm] Swelling    Patient's tongue swells  . Sulfa Antibiotics Swelling  . Other     Had to replace "catgut" with clamps  . Wheat Bran Other (See Comments)    REACTION: unknown    Family History  Problem Relation Age of Onset  . Diabetes Mother   . Hypertension Mother      Prior to Admission medications   Medication Sig Start Date End Date Taking? Authorizing Provider  atorvastatin (LIPITOR) 80 MG tablet Take 1 tablet (80 mg total) by mouth daily at 6 PM.  07/06/16  Yes Johnson, Clanford L, MD  carvedilol (COREG) 3.125 MG tablet Take 3.125 mg every other day by mouth.    Yes [provider]  cetirizine (ZYRTEC) 10 MG tablet Take 10 mg daily by mouth.    Yes [provider]  cholecalciferol (VITAMIN D) 1000 units tablet Take 1,000 Units by mouth. Twice a month   Yes [provider]  ferrous sulfate 325 (65 FE) MG tablet Take 325 mg 3 (three) times daily by mouth. Monday,Wednesday, friday   Yes [provider]  furosemide (LASIX) 40  MG tablet Take 40 mg 2 (two) times daily by mouth.  09/09/17  Yes [provider]  losartan (COZAAR) 25 MG tablet Take 1 tablet (25 mg total) by mouth at bedtime. Patient taking differently: Take 25 mg daily by mouth.  04/02/17  Yes Dhungel, Nishant, MD  magnesium oxide (MAG-OX) 400 (241.3 Mg) MG tablet Take 400 mg by mouth. Twice a month   Yes [provider]  nitroGLYCERIN (NITROSTAT) 0.4 MG SL tablet Place 0.4 mg under the tongue every 5 (five) minutes as needed for chest pain.   Yes [provider]  potassium chloride SA (K-DUR,KLOR-CON) 20 MEQ tablet Take 1 tablet (20 mEq total) by mouth daily. 04/13/17  Yes Graciella Freer, PA-C  spironolactone (ALDACTONE) 25 MG tablet Take 1 tablet (25 mg total) by mouth daily. 04/13/17  Yes Graciella Freer, PA-C  ticagrelor (BRILINTA) 60 MG TABS tablet Take 1 tablet (60 mg total) by mouth 2 (two) times daily. Patient taking differently: Take 60 mg daily by mouth.  04/02/17  Yes Dhungel, Theda Belfast, MD    Physical Exam: Vitals:   09/22/17 1936 09/22/17 2245 09/23/17 0002 09/23/17 0015  BP: (!) 152/97 (!) 154/115 (!) 148/102 135/80  Pulse: 81 92 89 89  Resp: 18 (!) 21 20 19   Temp:      TempSrc:      SpO2: 99% 100% 100% 100%  Weight:      Height:          Constitutional: NAD, calm, chronically-ill appearing Eyes: PERTLA, lids and conjunctivae normal ENMT: Mucous membranes are moist. Posterior pharynx clear of any exudate or lesions.   Neck: normal, supple, no masses, no thyromegaly Respiratory: clear to auscultation bilaterally, no wheezing, no crackles. Normal respiratory effort.    Cardiovascular: S1 & S2 heard, regular rate and rhythm. Marked bilateral LE edema extending into abdomen. Abdomen: Edema involving lower abdominal wall, no tenderness. Bowel sounds normal.  Musculoskeletal: no clubbing / cyanosis. No joint deformity upper and lower extremities.   Skin: no significant rashes, lesions, ulcers.  Warm, dry, well-perfused. Neurologic: CN 2-12 grossly intact. Sensation intact. Strength 5/5 in all 4 limbs.  Psychiatric: Alert and oriented x 3. Calm, cooperative.     Labs on Admission: I have personally reviewed following labs and imaging studies  CBC: Recent Labs  Lab 09/22/17 1625  WBC 8.6  HGB 14.6  HCT 46.8*  MCV 84.2  PLT 298   Basic Metabolic Panel: Recent Labs  Lab 09/22/17 1625  NA 142  K 3.6  CL 107  CO2 25  GLUCOSE 102*  BUN 15  CREATININE 1.23*  CALCIUM 9.2   GFR: Estimated Creatinine Clearance: 54.9 mL/min (A) (by C-G formula based on SCr of 1.23 mg/dL (H)). Liver Function Tests: No results for input(s): AST, ALT, ALKPHOS, BILITOT, PROT, ALBUMIN in the last 168 hours. No results for input(s): LIPASE, AMYLASE in the last 168 hours. No results for  input(s): AMMONIA in the last 168 hours. Coagulation Profile: No results for input(s): INR, PROTIME in the last 168 hours. Cardiac Enzymes: No results for input(s): CKTOTAL, CKMB, CKMBINDEX, TROPONINI in the last 168 hours. BNP (last 3 results) No results for input(s): PROBNP in the last 8760 hours. HbA1C: No results for input(s): HGBA1C in the last 72 hours. CBG: No results for input(s): GLUCAP in the last 168 hours. Lipid Profile: No results for input(s): CHOL, HDL, LDLCALC, TRIG, CHOLHDL, LDLDIRECT in the last 72 hours. Thyroid Function Tests: No results for input(s): TSH, T4TOTAL, FREET4, T3FREE, THYROIDAB in the last 72 hours. Anemia Panel: No results for input(s): VITAMINB12, FOLATE, FERRITIN, TIBC, IRON, RETICCTPCT in the last 72 hours. Urine analysis:    Component Value Date/Time   COLORURINE YELLOW 01/28/2017 1850   APPEARANCEUR CLEAR 01/28/2017 1850   LABSPEC 1.011 01/28/2017 1850   PHURINE 5.0 01/28/2017 1850   GLUCOSEU NEGATIVE 01/28/2017 1850   HGBUR SMALL (A) 01/28/2017 1850   BILIRUBINUR NEGATIVE 01/28/2017 1850   KETONESUR NEGATIVE 01/28/2017 1850   PROTEINUR 30 (A) 01/28/2017  1850   UROBILINOGEN 1.0 07/25/2015 1601   NITRITE NEGATIVE 01/28/2017 1850   LEUKOCYTESUR NEGATIVE 01/28/2017 1850   Sepsis Labs: @LABRCNTIP (procalcitonin:4,lacticidven:4) )No results found for this or any previous visit (from the past 240 hour(s)).   Radiological Exams on Admission: Dg Chest 2 View  Result Date: 09/22/2017 CLINICAL DATA:  CHF, swelling. Pt. Has swelling from chest and abdomen through both knees x 2 days. No chest pain or SOB. Hx of CHF EXAM: CHEST  2 VIEW COMPARISON:  03/26/2017 FINDINGS: Heart is mildly enlarged and stable in configuration. There is persistent right pleural effusion or pleural thickening and right basilar atelectasis or scarring, stable in appearance. There are no focal consolidations. No pulmonary edema. IMPRESSION: 1. Stable cardiomegaly.  No edema. 2. Stable changes at the right lung base, compatible with scarring and pleural thickening. Electronically Signed   By: 05/26/2017 M.D.   On: 09/22/2017 16:56    EKG: Independently reviewed. Normal sinus rhythm.   Assessment/Plan  1. Acute on chronic combined systolic/diastolic CHF  - Pt presents with swelling and non-productive cough, is noted to have marked peripheral edema and marked wt gain  - Treated with Lasix 40 mg IV in ED  - Plan to continue cardiac monitoring, continue diuresis with Lasix 40 mg IV q12h, continue ARB and beta-blocker, SLIV, fluid-restrict diet, follow strict I/O's and daily wts    2. CKD stage III  - SCr is 1.23 on admission, consistent with her apparent baseline  - Follow daily chem panel during diuresis   3. CAD - No anginal complaints, troponin negative  - Continue beta-blocker, ARB, Lipitor, and Brilinta    DVT prophylaxis: Lovenox Code Status: Full  Family Communication: Discussed with patient Disposition Plan: Admit to telemetry Consults called: None Admission status: Inpatient    13/05/2017, MD [facility] Pager [phone]  If 7PM-7AM,  please contact night-coverage [email or web address] Password [email or web address]  09/23/2017, 12:55 AM

## 2017-09-23 NOTE — ED Notes (Signed)
Admitting Provider at bedside. 

## 2017-09-23 NOTE — ED Notes (Signed)
Pt placed on PureWick external female catheter attached to suction.

## 2017-09-23 NOTE — ED Notes (Signed)
ED Provider at bedside. 

## 2017-09-24 ENCOUNTER — Other Ambulatory Visit: Payer: Self-pay

## 2017-09-24 LAB — BASIC METABOLIC PANEL
Anion gap: 13 (ref 5–15)
BUN: 14 mg/dL (ref 6–20)
CO2: 27 mmol/L (ref 22–32)
Calcium: 9.4 mg/dL (ref 8.9–10.3)
Chloride: 99 mmol/L — ABNORMAL LOW (ref 101–111)
Creatinine, Ser: 1.22 mg/dL — ABNORMAL HIGH (ref 0.44–1.00)
GFR calc Af Amer: 52 mL/min — ABNORMAL LOW (ref >60)
GFR calc non Af Amer: 45 mL/min — ABNORMAL LOW (ref >60)
Glucose, Bld: 102 mg/dL — ABNORMAL HIGH (ref 65–99)
Potassium: 3 mmol/L — ABNORMAL LOW (ref 3.5–5.1)
Sodium: 139 mmol/L (ref 135–145)

## 2017-09-24 LAB — CBC WITH DIFFERENTIAL/PLATELET
Basophils Absolute: 0.1 10*3/uL (ref 0.0–0.1)
Basophils Relative: 1 %
Eosinophils Absolute: 0.2 10*3/uL (ref 0.0–0.7)
Eosinophils Relative: 2 %
HCT: 44.5 % (ref 36.0–46.0)
Hemoglobin: 13.8 g/dL (ref 12.0–15.0)
Lymphocytes Relative: 19 %
Lymphs Abs: 1.5 10*3/uL (ref 0.7–4.0)
MCH: 25.9 pg — ABNORMAL LOW (ref 26.0–34.0)
MCHC: 31 g/dL (ref 30.0–36.0)
MCV: 83.5 fL (ref 78.0–100.0)
Monocytes Absolute: 0.7 10*3/uL (ref 0.1–1.0)
Monocytes Relative: 9 %
Neutro Abs: 5.4 10*3/uL (ref 1.7–7.7)
Neutrophils Relative %: 69 %
Platelets: 275 10*3/uL (ref 150–400)
RBC: 5.33 MIL/uL — ABNORMAL HIGH (ref 3.87–5.11)
RDW: 16.6 % — ABNORMAL HIGH (ref 11.5–15.5)
WBC: 7.8 10*3/uL (ref 4.0–10.5)

## 2017-09-24 MED ORDER — POTASSIUM CHLORIDE CRYS ER 20 MEQ PO TBCR
40.0000 meq | EXTENDED_RELEASE_TABLET | Freq: Two times a day (BID) | ORAL | Status: DC
  Filled 2017-09-24: qty 2

## 2017-09-24 MED ORDER — POTASSIUM CHLORIDE CRYS ER 20 MEQ PO TBCR
20.0000 meq | EXTENDED_RELEASE_TABLET | Freq: Once | ORAL | Status: AC
  Administered 2017-09-24: 20 meq via ORAL

## 2017-09-24 MED ORDER — LOSARTAN POTASSIUM 50 MG PO TABS
100.0000 mg | ORAL_TABLET | Freq: Every day | ORAL | Status: DC
  Administered 2017-09-26 – 2017-09-29 (×4): 100 mg via ORAL
  Filled 2017-09-24 (×5): qty 2

## 2017-09-24 MED ORDER — POTASSIUM CHLORIDE CRYS ER 20 MEQ PO TBCR
40.0000 meq | EXTENDED_RELEASE_TABLET | Freq: Two times a day (BID) | ORAL | Status: DC
  Administered 2017-09-24 – 2017-09-29 (×10): 40 meq via ORAL
  Filled 2017-09-24 (×10): qty 2

## 2017-09-24 NOTE — Progress Notes (Signed)
PROGRESS NOTE    Deborah Ho  HKF:[id number] DOB: July 13, 1951 DOA: 09/22/2017 PCP: Kerin Perna, NP   Specialists:     Brief Narrative:  50 ? EF 20-25% [has declined ICD in past and Entresto], Reflux, HLD,HTn, CAD STEMi 9574 complicated by stent thrombosis from noncompliance with Ticagrelor, Elevated total bili/alk phos ? Cause, CKD III Base weight on last d/c prior to home 86 kg  Readmitted with vol overload and wght 105 kg--states weight at Primary [facility] was the same as home weights however Admits to indiscretion with diet and eating more salt than usual Not able to take brilnta daily Tells me is taking lasix 40 bid as scheudled but is not on Aldactone at home     Assessment & Plan:   Principal Problem:   Acute on chronic combined systolic and diastolic CHF (congestive heart failure) (HCC) Active Problems:   Essential hypertension   CKD (chronic kidney disease), stage III (HCC)   Decompensated sys hf ef 20-25% Lost ~ 7lbs , 1 3 liter so far Cont lasix 80 bid, restarted aldactone 25 qd--was on Torsemide 60 at some point Coreg 3.125 bd held in acute hf Entresto discussed but not taking at present Improved  Htn Reasonable control--held losartan 25 on admit  CAD-stemi 2015 with in stent thrombosis-non compliance Ticagrelor Continuing ticagrelor 60 Resume BB and Losartan when able per cards Prn nitro if needed  Hypokalemia 3.0 Replacing with kdur 40 meq bid-labs in am, add magnesiume as well  Cough Etiology unclear-no rales on exam-afebrile Adding chloraseptic spray which has helped in the past   DVT prophylaxis: lovenox Code Status:  full Family Communication:  None  Disposition Plan: inpatient   Consultants:   cardiology  Procedures:   none  Antimicrobials:   noen    Subjective: Awake alert eating and drinking ok--seems to understand salt/fluid related to chf issue asking if should walk around the room No cp No fever Dry cough ++ no  sputum, noted  Objective: Vitals:   09/23/17 1451 09/23/17 2208 09/24/17 0600 09/24/17 1302  BP: 133/80 (!) 143/91 133/77 129/81  Pulse: 86 94 87 85  Resp:      Temp: 98.1 F (36.7 C) 97.7 F (36.5 C) 98.2 F (36.8 C) (!) 97.4 F (36.3 C)  TempSrc: Oral Oral Oral Oral  SpO2: 100% 100% 100%   Weight:   102.1 kg (225 lb 1.6 oz)   Height:        Intake/Output Summary (Last 24 hours) at 09/24/2017 1334 Last data filed at 09/24/2017 0600 Gross per 24 hour  Intake 336 ml  Output 3950 ml  Net -3614 ml   Filed Weights   09/22/17 1614 09/23/17 0630 09/24/17 0600  Weight: 104.3 kg (230 lb) 105.5 kg (232 lb 8 oz) 102.1 kg (225 lb 1.6 oz)    Examination:  eomi ncat cta b, no added sound Thick neck, Mallampati 3-4 abd soft nt nd no rebound but anasarca powr 5/5 reflexes deferred smile symm Grade 4 Le edmea  Data Reviewed: I have personally reviewed following labs and imaging studies  CBC: Recent Labs  Lab 09/22/17 1625 09/24/17 0408  WBC 8.6 7.8  NEUTROABS  --  5.4  HGB 14.6 13.8  HCT 46.8* 44.5  MCV 84.2 83.5  PLT 298 734   Basic Metabolic Panel: Recent Labs  Lab 09/22/17 1625 09/24/17 0408  NA 142 139  K 3.6 3.0*  CL 107 99*  CO2 25 27  GLUCOSE 102* 102*  BUN 15  14  CREATININE 1.23* 1.22*  CALCIUM 9.2 9.4   GFR: Estimated Creatinine Clearance: 53.7 mL/min (A) (by C-G formula based on SCr of 1.22 mg/dL (H)). Liver Function Tests: No results for input(s): AST, ALT, ALKPHOS, BILITOT, PROT, ALBUMIN in the last 168 hours. No results for input(s): LIPASE, AMYLASE in the last 168 hours. No results for input(s): AMMONIA in the last 168 hours. Coagulation Profile: No results for input(s): INR, PROTIME in the last 168 hours. Cardiac Enzymes: No results for input(s): CKTOTAL, CKMB, CKMBINDEX, TROPONINI in the last 168 hours. BNP (last 3 results) No results for input(s): PROBNP in the last 8760 hours. HbA1C: No results for input(s): HGBA1C in the last 72  hours. CBG: No results for input(s): GLUCAP in the last 168 hours. Lipid Profile: No results for input(s): CHOL, HDL, LDLCALC, TRIG, CHOLHDL, LDLDIRECT in the last 72 hours. Thyroid Function Tests: No results for input(s): TSH, T4TOTAL, FREET4, T3FREE, THYROIDAB in the last 72 hours. Anemia Panel: No results for input(s): VITAMINB12, FOLATE, FERRITIN, TIBC, IRON, RETICCTPCT in the last 72 hours. Urine analysis:    Component Value Date/Time   COLORURINE YELLOW 01/28/2017 1850   APPEARANCEUR CLEAR 01/28/2017 1850   LABSPEC 1.011 01/28/2017 1850   PHURINE 5.0 01/28/2017 1850   GLUCOSEU NEGATIVE 01/28/2017 1850   HGBUR SMALL (A) 01/28/2017 1850   BILIRUBINUR NEGATIVE 01/28/2017 1850   KETONESUR NEGATIVE 01/28/2017 1850   PROTEINUR 30 (A) 01/28/2017 1850   UROBILINOGEN 1.0 07/25/2015 1601   NITRITE NEGATIVE 01/28/2017 1850   LEUKOCYTESUR NEGATIVE 01/28/2017 1850     Radiology Studies: Reviewed images personally in health database    Scheduled Meds: . atorvastatin  80 mg Oral q1800  . enoxaparin (LOVENOX) injection  40 mg Subcutaneous Q24H  . ferrous sulfate  325 mg Oral TID WC  . furosemide  80 mg Intravenous Q12H  . loratadine  10 mg Oral Daily  . [START ON 09/25/2017] losartan  100 mg Oral Daily  . magnesium oxide  400 mg Oral BID  . potassium chloride  40 mEq Oral BID  . sodium chloride flush  3 mL Intravenous Q12H  . spironolactone  25 mg Oral Daily  . ticagrelor  60 mg Oral BID   Continuous Infusions: . sodium chloride       LOS: 1 day    Time spent: Verdi, MD [facility] ([facility]   If 7PM-7AM, please contact night-coverage [email or web address] Password [email or web address] 09/24/2017, 1:34 PM

## 2017-09-24 NOTE — Progress Notes (Signed)
Progress Note  Patient Name: Deborah Ho Date of Encounter: 09/24/2017  Primary Cardiologist: Dr Shirlee Latch  Subjective   No chest pain or dyspnea  Inpatient Medications    Scheduled Meds: . atorvastatin  80 mg Oral q1800  . enoxaparin (LOVENOX) injection  40 mg Subcutaneous Q24H  . ferrous sulfate  325 mg Oral TID WC  . furosemide  80 mg Intravenous Q12H  . loratadine  10 mg Oral Daily  . losartan  50 mg Oral Daily  . magnesium oxide  400 mg Oral BID  . potassium chloride  40 mEq Oral BID  . sodium chloride flush  3 mL Intravenous Q12H  . spironolactone  25 mg Oral Daily  . ticagrelor  60 mg Oral BID   Continuous Infusions: . sodium chloride     PRN Meds: sodium chloride, acetaminophen, albuterol, ALPRAZolam, hydrALAZINE, HYDROcodone-acetaminophen, ondansetron (ZOFRAN) IV, sodium chloride flush   Vital Signs    Vitals:   09/23/17 1030 09/23/17 1451 09/23/17 2208 09/24/17 0600  BP: 132/79 133/80 (!) 143/91 133/77  Pulse: 84 86 94 87  Resp:      Temp:  98.1 F (36.7 C) 97.7 F (36.5 C) 98.2 F (36.8 C)  TempSrc:  Oral Oral Oral  SpO2:  100% 100% 100%  Weight:    225 lb 1.6 oz (102.1 kg)  Height:        Intake/Output Summary (Last 24 hours) at 09/24/2017 1037 Last data filed at 09/24/2017 0600 Gross per 24 hour  Intake 336 ml  Output 3950 ml  Net -3614 ml   Filed Weights   09/22/17 1614 09/23/17 0630 09/24/17 0600  Weight: 230 lb (104.3 kg) 232 lb 8 oz (105.5 kg) 225 lb 1.6 oz (102.1 kg)    Telemetry    Sinus - Personally Reviewed   Physical Exam   GEN: Obese No acute distress.   Neck: supple Cardiac: RRR, no murmurs, rubs, or gallops.  Respiratory: Clear to auscultation bilaterally. GI: Soft, nontender, non-distended  MS: 3+ edema Neuro:  Nonfocal  Psych: Normal affect   Labs    Chemistry Recent Labs  Lab 09/22/17 1625 09/24/17 0408  NA 142 139  K 3.6 3.0*  CL 107 99*  CO2 25 27  GLUCOSE 102* 102*  BUN 15 14  CREATININE 1.23*  1.22*  CALCIUM 9.2 9.4  GFRNONAA 45* 45*  GFRAA 52* 52*  ANIONGAP 10 13     Hematology Recent Labs  Lab 09/22/17 1625 09/24/17 0408  WBC 8.6 7.8  RBC 5.56* 5.33*  HGB 14.6 13.8  HCT 46.8* 44.5  MCV 84.2 83.5  MCH 26.3 25.9*  MCHC 31.2 31.0  RDW 16.4* 16.6*  PLT 298 275     Recent Labs  Lab 09/22/17 1650  TROPIPOC 0.02       Radiology    Dg Chest 2 View  Result Date: 09/22/2017 CLINICAL DATA:  CHF, swelling. Pt. Has swelling from chest and abdomen through both knees x 2 days. No chest pain or SOB. Hx of CHF EXAM: CHEST  2 VIEW COMPARISON:  03/26/2017 FINDINGS: Heart is mildly enlarged and stable in configuration. There is persistent right pleural effusion or pleural thickening and right basilar atelectasis or scarring, stable in appearance. There are no focal consolidations. No pulmonary edema. IMPRESSION: 1. Stable cardiomegaly.  No edema. 2. Stable changes at the right lung base, compatible with scarring and pleural thickening. Electronically Signed   By: Norva Pavlov M.D.   On: 09/22/2017 16:56  Patient Profile     66 year old female with past medical history of ischemic cardiomyopathy, coronary artery disease, combined chronic systolic/diastolic congestive heart failure, chronic stage III kidney disease, hypertension, hyperlipidemia, noncompliance with acute on chronic systolic congestive heart failure. Markedly volume overloaded at time of initial evaluation. Echocardiogram shows ejection fraction 20-25%, mild to moderate mitral regurgitation, dilated RV with reduced function and moderate tricuspid regurgitation.    Assessment & Plan     1 acute on chronic systolic congestive heart failure-I/O - 3424. Weight 225. Patient remains markedly volume overloaded on examination. Good diuresis; continue Lasix 80 mg IV twice a day, spironolactone. Follow renal function closely with diuresis. Patient previously instructed on low sodium diet and fluid restriction. We  also discussed daily weights and need for additional diuretic for weight gain of 2-3 pounds.   2 ischemic cardiomyopathy-given acute presentation of CHF will hold carvedilol for now. Will resume when CHF improves and titrate as tolerated by pulse and blood pressure. Increase Cozaar to 100 mg daily. She apparently felt entresto was causing dizziness as an outpatient. However this could be reattempted following discharge.  3 coronary artery disease-patient has an aspirin allergy. Continue Brilinta and statin.   4 chronic stage III kidney disease-Follow closely with diuresis.  5 hypertension-Advance cozaar for CM as outlined.  6 Hyperlipidemia-continue lipitor.  7 Hypkalemia-supplement  For questions or updates, please contact [facility] Please consult [email or web address] for contact info under Cardiology/STEMI.      Signed, Olga Millers, MD  09/24/2017, 10:37 AM

## 2017-09-25 LAB — BASIC METABOLIC PANEL
Anion gap: 10 (ref 5–15)
BUN: 18 mg/dL (ref 6–20)
CO2: 32 mmol/L (ref 22–32)
Calcium: 9.6 mg/dL (ref 8.9–10.3)
Chloride: 98 mmol/L — ABNORMAL LOW (ref 101–111)
Creatinine, Ser: 1.32 mg/dL — ABNORMAL HIGH (ref 0.44–1.00)
GFR calc Af Amer: 48 mL/min — ABNORMAL LOW (ref >60)
GFR calc non Af Amer: 41 mL/min — ABNORMAL LOW (ref >60)
Glucose, Bld: 95 mg/dL (ref 65–99)
Potassium: 3.7 mmol/L (ref 3.5–5.1)
Sodium: 140 mmol/L (ref 135–145)

## 2017-09-25 NOTE — Plan of Care (Addendum)
Transfers to [facility] and bathroom independently

## 2017-09-25 NOTE — Progress Notes (Signed)
Progress Note  Patient Name: Deborah Ho Date of Encounter: 09/25/2017  Primary Cardiologist: Dr. [name]  Subjective   Sitting up in chair.  States that she is gradually feeling better in terms of shortness of breath and leg swelling.  No chest pain or palpitations.  Inpatient Medications    Scheduled Meds: . atorvastatin  80 mg Oral q1800  . enoxaparin (LOVENOX) injection  40 mg Subcutaneous Q24H  . ferrous sulfate  325 mg Oral TID WC  . furosemide  80 mg Intravenous Q12H  . loratadine  10 mg Oral Daily  . losartan  100 mg Oral Daily  . magnesium oxide  400 mg Oral BID  . potassium chloride  40 mEq Oral BID  . sodium chloride flush  3 mL Intravenous Q12H  . spironolactone  25 mg Oral Daily  . ticagrelor  60 mg Oral BID   Continuous Infusions: . sodium chloride     PRN Meds: sodium chloride, acetaminophen, albuterol, ALPRAZolam, hydrALAZINE, HYDROcodone-acetaminophen, ondansetron (ZOFRAN) IV, sodium chloride flush   Vital Signs    Vitals:   09/24/17 0600 09/24/17 1302 09/24/17 2051 09/25/17 0544  BP: 133/77 129/81 125/74 131/88  Pulse: 87 85 84 84  Resp:   18 18  Temp: 98.2 F (36.8 C) (!) 97.4 F (36.3 C) 98.7 F (37.1 C) 98.4 F (36.9 C)  TempSrc: Oral Oral Oral Oral  SpO2: 100%  97% 100%  Weight: 225 lb 1.6 oz (102.1 kg)   221 lb 8 oz (100.5 kg)  Height:        Intake/Output Summary (Last 24 hours) at 09/25/2017 0904 Last data filed at 09/25/2017 0800 Gross per 24 hour  Intake 960 ml  Output 2400 ml  Net -1440 ml   Filed Weights   09/23/17 0630 09/24/17 0600 09/25/17 0544  Weight: 232 lb 8 oz (105.5 kg) 225 lb 1.6 oz (102.1 kg) 221 lb 8 oz (100.5 kg)    Telemetry    Sinus rhythm with occasional PVCs.  Personally reviewed.  Physical Exam   GEN:  Obese. No acute distress.   Neck: No JVD. Cardiac: RRR, no gallop.  Respiratory: Nonlabored. Clear to auscultation bilaterally. GI: Soft, nontender, bowel sounds present. MS: 2+ leg  edema; No deformity. Neuro:  Nonfocal. Psych: Alert and oriented x 3. Normal affect.  Labs    Chemistry Recent Labs  Lab 09/22/17 1625 09/24/17 0408 09/25/17 0505  NA 142 139 140  K 3.6 3.0* 3.7  CL 107 99* 98*  CO2 25 27 32  GLUCOSE 102* 102* 95  BUN 15 14 18   CREATININE 1.23* 1.22* 1.32*  CALCIUM 9.2 9.4 9.6  GFRNONAA 45* 45* 41*  GFRAA 52* 52* 48*  ANIONGAP 10 13 10      Hematology Recent Labs  Lab 09/22/17 1625 09/24/17 0408  WBC 8.6 7.8  RBC 5.56* 5.33*  HGB 14.6 13.8  HCT 46.8* 44.5  MCV 84.2 83.5  MCH 26.3 25.9*  MCHC 31.2 31.0  RDW 16.4* 16.6*  PLT 298 275    Cardiac EnzymesNo results for input(s): TROPONINI in the last 168 hours.  Recent Labs  Lab 09/22/17 1650  TROPIPOC 0.02     Radiology    No results found.  Cardiac Studies   Echocardiogram 09/23/2017: Study Conclusions  - Procedure narrative: Transthoracic echocardiography. Image   quality was suboptimal. The study was technically difficult, as a   result of poor sound wave transmission and body habitus. - Left ventricle: The cavity size was moderately  dilated. Wall   thickness was normal. Systolic function was severely reduced. The   estimated ejection fraction was in the range of 20% to 25%.   Severe diffuse hypokinesis with distinct regional wall motion   abnormalities. Akinesis of the apical myocardium. Severe   hypokinesis of the anteroseptal, anterior, and anterolateral   myocardium; consistent with ischemia in the distribution of the   left anterior descending coronary artery. Doppler parameters are   consistent with restrictive physiology, indicative of decreased   left ventricular diastolic compliance and/or increased left   atrial pressure. Acoustic contrast opacification revealed no   evidence ofthrombus. - Mitral valve: There was mild to moderate regurgitation directed   centrally. - Right ventricle: The cavity size was dilated. Systolic function   was reduced. -  Right atrium: The atrium was mildly dilated. - Tricuspid valve: There was moderate regurgitation. - Pulmonary arteries: Systolic pressure was moderately increased.   PA peak pressure: 49 mm Hg (S).  Patient Profile     66 y.o. female with a history of CAD, ischemic cardiomyopathy with chronic combined heart failure, CKD stage III, hypertension, hyperlipidemia, and noncompliance.  She is currently admitted with acute on chronic combined heart failure and significant volume overload.  Assessment & Plan    1.  Acute on chronic systolic heart failure.  Patient continues to improve with diuresis on IV Lasix.  Net output approximately 1100 cc last 24 hours.   2.  Ischemic cardiomyopathy with LVEF 20-25% by echocardiogram.  Coreg currently held in the setting of decompensated heart failure, although continues on Cozaar.  3.  CAD, currently stable without active angina symptoms.  She has an aspirin allergy and remains on Brilinta.  4.  Hyperlipidemia, continues on Lipitor.  5.  CKD stage III, most recent creatinine 1.3.  No change in current regimen, continue present dose of IV Lasix.  Follow-up BMET and urine output.  Dry weight looks to be closer to 195-200 pounds based on records.  Signed, Nona Dell, MD  09/25/2017, 9:04 AM

## 2017-09-25 NOTE — Progress Notes (Signed)
PROGRESS NOTE   Deborah Ho  PVV:[id number] DOB: 1951/06/27 DOA: 09/22/2017 PCP: Kerin Perna, NP   Specialists:   Brief Narrative:   54 ? EF 20-25% [has declined ICD in past and Entresto], Reflux, HLD,HTn, CAD STEMi 7544 complicated by stent thrombosis from noncompliance with Ticagrelor, Elevated total bili/alk phos ? Cause, CKD III Base weight on last d/c prior to home 86 kg  Readmitted with vol overload and wght 105 kg--states weight at [facility] was the same as home weights however Admits to indiscretion with diet and eating more salt than usual Not able to take brilnta daily Tells me is taking lasix 40 bid as scheudled but is not on Aldactone at home  Cardiology consulted given multiple hosptial  Assessment & Plan:    Principal Problem:   Acute on chronic combined systolic and diastolic CHF (congestive heart failure) ([place]) Active Problems:   Essential hypertension   CKD (chronic kidney disease), stage III (HCC)   Decompensated sys hf ef 20-25% Lost 4.8 liter so far On IV lasix 80 bid, restarted aldactone 25 qd--was on Torsemide 60 at some point Coreg 3.125 bd held in acute hf Entresto discussed but not taking at present Improved overall will be while before reaches OP weight of 200lb  Htn Reasonable control--held losartan 25 on admit  CAD-stemi 2015 with in stent thrombosis-non compliance Ticagrelor Continuing ticagrelor 60 Resume BB and Losartan when able per cards Prn nitro if needed  Hypokalemia 3.0 Replacing with kdur 40 meq bid- K now 3.7  Cough Etiology unclear-no rales on exam-afebrile Adding chloraseptic spray which has helped in the past  DVT prophylaxis: lovenox Code Status:  full Family Communication:  None  Disposition Plan: inpatient   Consultants:   cardiology  Procedures:   none  Antimicrobials:   noen    Subjective:  No new issue overall feels better Discussed Stocking which she doesn't like to use but willing  to re-consider as OP  Objective: Vitals:   09/24/17 2051 09/25/17 0544 09/25/17 0942 09/25/17 1346  BP: 125/74 131/88 113/75 (!) 113/56  Pulse: 84 84 86 85  Resp: _0 Temp: 98.7 F (37.1 C) 98.4 F (36.9 C) 97.7 F (36.5 C) 98 F (36.7 C)  TempSrc: Oral Oral Oral Oral  SpO2: 97% 100% 97% 98%  Weight:  100.5 kg (221 lb 8 oz)    Height:        Intake/Output Summary (Last 24 hours) at 09/25/2017 1646 Last data filed at 09/25/2017 1338 Gross per 24 hour  Intake 1350 ml  Output 3575 ml  Net -2225 ml   Filed Weights   09/23/17 0630 09/24/17 0600 09/25/17 0544  Weight: 105.5 kg (232 lb 8 oz) 102.1 kg (225 lb 1.6 oz) 100.5 kg (221 lb 8 oz)    Examination:  eomi ncat cta b, no added sound Thick neck, Mallampati 3-4 abd soft nt nd no rebound but anasarca powr 5/5 reflexes deferred smile symm Grade 4 Le edema but decreasing to some extent  Data Reviewed: I have personally reviewed following labs and imaging studies  CBC: Recent Labs  Lab 09/22/17 1625 09/24/17 0408  WBC 8.6 7.8  NEUTROABS  --  5.4  HGB 14.6 13.8  HCT 46.8* 44.5  MCV 84.2 83.5  PLT 298 920   Basic Metabolic Panel: Recent Labs  Lab 09/22/17 1625 09/24/17 0408 09/25/17 0505  NA 142 139 140  K 3.6 3.0* 3.7  CL 107 99* 98*  CO2 25 27  32  GLUCOSE 102* 102* 95  BUN _0 CREATININE 1.23* 1.22* 1.32*  CALCIUM 9.2 9.4 9.6   GFR: Estimated Creatinine Clearance: 49.2 mL/min (A) (by C-G formula based on SCr of 1.32 mg/dL (H)). Liver Function Tests: No results for input(s): AST, ALT, ALKPHOS, BILITOT, PROT, ALBUMIN in the last 168 hours. No results for input(s): LIPASE, AMYLASE in the last 168 hours. No results for input(s): AMMONIA in the last 168 hours. Coagulation Profile: No results for input(s): INR, PROTIME in the last 168 hours. Cardiac Enzymes: No results for input(s): CKTOTAL, CKMB, CKMBINDEX, TROPONINI in the last 168 hours. BNP (last 3 results) No results for input(s):  PROBNP in the last 8760 hours. HbA1C: No results for input(s): HGBA1C in the last 72 hours. CBG: No results for input(s): GLUCAP in the last 168 hours. Lipid Profile: No results for input(s): CHOL, HDL, LDLCALC, TRIG, CHOLHDL, LDLDIRECT in the last 72 hours. Thyroid Function Tests: No results for input(s): TSH, T4TOTAL, FREET4, T3FREE, THYROIDAB in the last 72 hours. Anemia Panel: No results for input(s): VITAMINB12, FOLATE, FERRITIN, TIBC, IRON, RETICCTPCT in the last 72 hours. Urine analysis:    Component Value Date/Time   COLORURINE YELLOW 01/28/2017 1850   APPEARANCEUR CLEAR 01/28/2017 1850   LABSPEC 1.011 01/28/2017 1850   PHURINE 5.0 01/28/2017 1850   GLUCOSEU NEGATIVE 01/28/2017 1850   HGBUR SMALL (A) 01/28/2017 1850   BILIRUBINUR NEGATIVE 01/28/2017 1850   KETONESUR NEGATIVE 01/28/2017 1850   PROTEINUR 30 (A) 01/28/2017 1850   UROBILINOGEN 1.0 07/25/2015 1601   NITRITE NEGATIVE 01/28/2017 1850   LEUKOCYTESUR NEGATIVE 01/28/2017 1850     Radiology Studies: Reviewed images personally in health database    Scheduled Meds: . atorvastatin  80 mg Oral q1800  . enoxaparin (LOVENOX) injection  40 mg Subcutaneous Q24H  . ferrous sulfate  325 mg Oral TID WC  . furosemide  80 mg Intravenous Q12H  . loratadine  10 mg Oral Daily  . losartan  100 mg Oral Daily  . magnesium oxide  400 mg Oral BID  . potassium chloride  40 mEq Oral BID  . sodium chloride flush  3 mL Intravenous Q12H  . spironolactone  25 mg Oral Daily  . ticagrelor  60 mg Oral BID   Continuous Infusions: . sodium chloride       LOS: 2 days    Time spent: [place], MD [facility] ([facility]   If 7PM-7AM, please contact night-coverage [email or web address] Password [facility] 09/25/2017, 4:46 PM

## 2017-09-26 LAB — BASIC METABOLIC PANEL
Anion gap: 12 (ref 5–15)
BUN: 21 mg/dL — ABNORMAL HIGH (ref 6–20)
CO2: 30 mmol/L (ref 22–32)
Calcium: 9.7 mg/dL (ref 8.9–10.3)
Chloride: 96 mmol/L — ABNORMAL LOW (ref 101–111)
Creatinine, Ser: 1.24 mg/dL — ABNORMAL HIGH (ref 0.44–1.00)
GFR calc Af Amer: 51 mL/min — ABNORMAL LOW (ref >60)
GFR calc non Af Amer: 44 mL/min — ABNORMAL LOW (ref >60)
Glucose, Bld: 100 mg/dL — ABNORMAL HIGH (ref 65–99)
Potassium: 4.3 mmol/L (ref 3.5–5.1)
Sodium: 138 mmol/L (ref 135–145)

## 2017-09-26 LAB — MAGNESIUM: Magnesium: 2.1 mg/dL (ref 1.7–2.4)

## 2017-09-26 MED ORDER — FUROSEMIDE 10 MG/ML IJ SOLN
8.0000 mg/h | INTRAVENOUS | Status: DC
  Administered 2017-09-26 – 2017-09-27 (×3): 8 mg/h via INTRAVENOUS
  Filled 2017-09-26: qty 25
  Filled 2017-09-26: qty 20
  Filled 2017-09-26 (×2): qty 25

## 2017-09-26 NOTE — Progress Notes (Signed)
Progress Note  Patient Name: Deborah Ho Date of Encounter: 09/26/2017  Primary Cardiologist: Dr. [name]  Subjective   Gradually improving.  No shortness of breath at rest.  No chest pain or palpitations.  Inpatient Medications    Scheduled Meds: . atorvastatin  80 mg Oral q1800  . enoxaparin (LOVENOX) injection  40 mg Subcutaneous Q24H  . ferrous sulfate  325 mg Oral TID WC  . furosemide  80 mg Intravenous Q12H  . loratadine  10 mg Oral Daily  . losartan  100 mg Oral Daily  . magnesium oxide  400 mg Oral BID  . potassium chloride  40 mEq Oral BID  . sodium chloride flush  3 mL Intravenous Q12H  . spironolactone  25 mg Oral Daily  . ticagrelor  60 mg Oral BID   Continuous Infusions: . sodium chloride     PRN Meds: sodium chloride, acetaminophen, albuterol, ALPRAZolam, hydrALAZINE, HYDROcodone-acetaminophen, ondansetron (ZOFRAN) IV, sodium chloride flush   Vital Signs    Vitals:   09/25/17 1346 09/25/17 1931 09/26/17 0414 09/26/17 0754  BP: (!) 113/56 126/77 128/83 136/87  Pulse: 85 85 87   Resp: 18 20 18    Temp: 98 F (36.7 C) 97.8 F (36.6 C) 97.7 F (36.5 C)   TempSrc: Oral Oral Oral   SpO2: 98% 100% 97%   Weight:   219 lb 3.2 oz (99.4 kg)   Height:        Intake/Output Summary (Last 24 hours) at 09/26/2017 0845 Last data filed at 09/26/2017 0800 Gross per 24 hour  Intake 1360 ml  Output 2275 ml  Net -915 ml   Filed Weights   09/24/17 0600 09/25/17 0544 09/26/17 0414  Weight: 225 lb 1.6 oz (102.1 kg) 221 lb 8 oz (100.5 kg) 219 lb 3.2 oz (99.4 kg)    Telemetry    Sinus rhythm.  Personally reviewed.  Physical Exam   GEN:  Obese.  No acute distress.   Neck: No JVD. Cardiac: RRR, no gallop.  Respiratory: Nonlabored. Clear to auscultation bilaterally. GI: Soft, nontender, bowel sounds present. MS: 2+ leg edema; No deformity. Neuro:  Nonfocal. Psych: Alert and oriented x 3. Normal affect.  Labs    Chemistry Recent Labs  Lab  09/24/17 0408 09/25/17 0505 09/26/17 0342  NA 139 140 138  K 3.0* 3.7 4.3  CL 99* 98* 96*  CO2 27 32 30  GLUCOSE 102* 95 100*  BUN 14 18 21*  CREATININE 1.22* 1.32* 1.24*  CALCIUM 9.4 9.6 9.7  GFRNONAA 45* 41* 44*  GFRAA 52* 48* 51*  ANIONGAP 13 10 12      Hematology Recent Labs  Lab 09/22/17 1625 09/24/17 0408  WBC 8.6 7.8  RBC 5.56* 5.33*  HGB 14.6 13.8  HCT 46.8* 44.5  MCV 84.2 83.5  MCH 26.3 25.9*  MCHC 31.2 31.0  RDW 16.4* 16.6*  PLT 298 275    Cardiac EnzymesNo results for input(s): TROPONINI in the last 168 hours.  Recent Labs  Lab 09/22/17 1650  TROPIPOC 0.02     Radiology    No results found.  Cardiac Studies   Echocardiogram 09/23/2017: Study Conclusions  - Procedure narrative: Transthoracic echocardiography. Image quality was suboptimal. The study was technically difficult, as a result of poor sound wave transmission and body habitus. - Left ventricle: The cavity size was moderately dilated. Wall thickness was normal. Systolic function was severely reduced. The estimated ejection fraction was in the range of 20% to 25%. Severe diffuse hypokinesis with  distinct regional wall motion abnormalities. Akinesis of the apical myocardium. Severe hypokinesis of the anteroseptal, anterior, and anterolateral myocardium; consistent with ischemia in the distribution of the left anterior descending coronary artery. Doppler parameters are consistent with restrictive physiology, indicative of decreased left ventricular diastolic compliance and/or increased left atrial pressure. Acoustic contrast opacification revealed no evidence ofthrombus. - Mitral valve: There was mild to moderate regurgitation directed centrally. - Right ventricle: The cavity size was dilated. Systolic function was reduced. - Right atrium: The atrium was mildly dilated. - Tricuspid valve: There was moderate regurgitation. - Pulmonary arteries: Systolic  pressure was moderately increased. PA peak pressure: 49 mm Hg (S).  Patient Profile     66 y.o. female with a history of CAD, ischemic cardiomyopathy with chronic combined heart failure, CKD stage III, hypertension, hyperlipidemia, and noncompliance.  She is currently admitted with acute on chronic combined heart failure and significant volume overload.  Assessment & Plan    1.  Acute on chronic systolic heart failure.  Patient diuresed approximately 1000 cc net in the last 24 hours on divided dose IV Lasix 80 mg every 12 hours.  Renal function steady with creatinine 1.2.  2.  Ischemic cardiomyopathy with LVEF 20-25%.  Coreg presently held in the setting of decompensated heart failure.  3.  CAD without active angina.  She is on Brilinta with aspirin allergy.  4.  Hyperlipidemia, continues on Lipitor.  5.  CKD stage III, creatinine 1.2 at this time.  Patient's dry weight looks to be closer to 195-200 pounds based on record review.  Plan is to change from divided dose IV Lasix to Lasix drip at 6 mg/h.  Otherwise continue Cozaar, Aldactone, Lipitor, magnesium and potassium supplements. BMET in a.m.  Signed, Nona Dell, MD  09/26/2017, 8:45 AM

## 2017-09-26 NOTE — Progress Notes (Signed)
PROGRESS NOTE   Deborah Ho  HLK:[id number] DOB: 1951/05/22 DOA: 09/22/2017 PCP: Kerin Perna, NP   Specialists:   Brief Narrative:   82 ? EF 20-25% [has declined ICD in past and Entresto], Reflux, HLD,HTn, CAD STEMi 7342 complicated by stent thrombosis from noncompliance with Ticagrelor, Elevated total bili/alk phos ? Cause, CKD III Base weight on last d/c prior to home 86 kg  Readmitted with vol overload and wght 105 kg--states weight at [facility] was the same as home weights however Admits to indiscretion with diet and eating more salt than usual Not able to take brilnta daily Tells me is taking lasix 40 bid as scheudled but is not on Aldactone at home  Cardiology consulted given multiple hospital stays and patient being diuresed  Assessment & Plan:    Principal Problem:   Acute on chronic combined systolic and diastolic CHF (congestive heart failure) ([place]) Active Problems:   Essential hypertension   CKD (chronic kidney disease), stage III (HCC)   Decompensated sys hf ef 20-25% Lost 6liters liter so far wght 104-->99Kg  lasix 80 bid->IV lasix gtt 8/h, restarted aldactone 25 qd--was on Torsemide 60 at some point Coreg 3.125 bd held in acute hf Entresto discussed but not taking at present Improved overall  Await her being closr to dry weight prior to decision re: d/c  Htn Reasonable control--held losartan 25 on admit  CAD-stemi 2015 with in stent thrombosis-non compliance Ticagrelor Continuing ticagrelor 60 Resume BB and Losartan when able per cards Prn nitro if needed  Hypokalemia 3.0 Replacing with kdur 40 meq bid- K now 4.3--watch k with aldactone  Cough Etiology unclear-resolved  DVT prophylaxis: lovenox Code Status:  full Family Communication:  None  Disposition Plan: inpatient   Consultants:   cardiology  Procedures:   none  Antimicrobials:   noen    Subjective:  Overall well No cp Questions about meds No sob Swelling  improved  Objective: Vitals:   09/25/17 1346 09/25/17 1931 09/26/17 0414 09/26/17 0754  BP: (!) 113/56 126/77 128/83 136/87  Pulse: 85 85 87   Resp: 18 20 18    Temp: 98 F (36.7 C) 97.8 F (36.6 C) 97.7 F (36.5 C)   TempSrc: Oral Oral Oral   SpO2: 98% 100% 97%   Weight:   99.4 kg (219 lb 3.2 oz)   Height:        Intake/Output Summary (Last 24 hours) at 09/26/2017 1612 Last data filed at 09/26/2017 1200 Gross per 24 hour  Intake 910 ml  Output 2900 ml  Net -1990 ml   Filed Weights   09/24/17 0600 09/25/17 0544 09/26/17 0414  Weight: 102.1 kg (225 lb 1.6 oz) 100.5 kg (221 lb 8 oz) 99.4 kg (219 lb 3.2 oz)    Examination:  eomi ncat cta b, no added sound Thick neck, Mallampati 3-4 abd soft nt nd no rebound anasarca but seems less Grade 3 Le edema but decreasing to some extent  Data Reviewed: I have personally reviewed following labs and imaging studies  CBC: Recent Labs  Lab 09/22/17 1625 09/24/17 0408  WBC 8.6 7.8  NEUTROABS  --  5.4  HGB 14.6 13.8  HCT 46.8* 44.5  MCV 84.2 83.5  PLT 298 876   Basic Metabolic Panel: Recent Labs  Lab 09/22/17 1625 09/24/17 0408 09/25/17 0505 09/26/17 0342  NA 142 139 140 138  K 3.6 3.0* 3.7 4.3  CL 107 99* 98* 96*  CO2 25 27 32 30  GLUCOSE 102* 102* 95  100*  BUN 15 14 18  21*  CREATININE 1.23* 1.22* 1.32* 1.24*  CALCIUM 9.2 9.4 9.6 9.7  MG  --   --   --  2.1   GFR: Estimated Creatinine Clearance: 52.1 mL/min (A) (by C-G formula based on SCr of 1.24 mg/dL (H)). Liver Function Tests: No results for input(s): AST, ALT, ALKPHOS, BILITOT, PROT, ALBUMIN in the last 168 hours. No results for input(s): LIPASE, AMYLASE in the last 168 hours. No results for input(s): AMMONIA in the last 168 hours. Coagulation Profile: No results for input(s): INR, PROTIME in the last 168 hours. Cardiac Enzymes: No results for input(s): CKTOTAL, CKMB, CKMBINDEX, TROPONINI in the last 168 hours. BNP (last 3 results) No results for  input(s): PROBNP in the last 8760 hours. HbA1C: No results for input(s): HGBA1C in the last 72 hours. CBG: No results for input(s): GLUCAP in the last 168 hours. Lipid Profile: No results for input(s): CHOL, HDL, LDLCALC, TRIG, CHOLHDL, LDLDIRECT in the last 72 hours. Thyroid Function Tests: No results for input(s): TSH, T4TOTAL, FREET4, T3FREE, THYROIDAB in the last 72 hours. Anemia Panel: No results for input(s): VITAMINB12, FOLATE, FERRITIN, TIBC, IRON, RETICCTPCT in the last 72 hours. Urine analysis:    Component Value Date/Time   COLORURINE YELLOW 01/28/2017 1850   APPEARANCEUR CLEAR 01/28/2017 1850   LABSPEC 1.011 01/28/2017 1850   PHURINE 5.0 01/28/2017 1850   GLUCOSEU NEGATIVE 01/28/2017 1850   HGBUR SMALL (A) 01/28/2017 1850   BILIRUBINUR NEGATIVE 01/28/2017 1850   KETONESUR NEGATIVE 01/28/2017 1850   PROTEINUR 30 (A) 01/28/2017 1850   UROBILINOGEN 1.0 07/25/2015 1601   NITRITE NEGATIVE 01/28/2017 1850   LEUKOCYTESUR NEGATIVE 01/28/2017 1850     Radiology Studies: Reviewed images personally in health database    Scheduled Meds: . atorvastatin  80 mg Oral q1800  . enoxaparin (LOVENOX) injection  40 mg Subcutaneous Q24H  . ferrous sulfate  325 mg Oral TID WC  . loratadine  10 mg Oral Daily  . losartan  100 mg Oral Daily  . magnesium oxide  400 mg Oral BID  . potassium chloride  40 mEq Oral BID  . sodium chloride flush  3 mL Intravenous Q12H  . spironolactone  25 mg Oral Daily  . ticagrelor  60 mg Oral BID   Continuous Infusions: . sodium chloride    . furosemide (LASIX) infusion 8 mg/hr (09/26/17 1439)     LOS: 3 days    Time spent: [place], MD [facility]   If 7PM-7AM, please contact night-coverage [email or web address] Password [email or web address] 09/26/2017, 4:12 PM

## 2017-09-27 LAB — BASIC METABOLIC PANEL
Anion gap: 11 (ref 5–15)
BUN: 21 mg/dL — ABNORMAL HIGH (ref 6–20)
CO2: 28 mmol/L (ref 22–32)
Calcium: 9.5 mg/dL (ref 8.9–10.3)
Chloride: 100 mmol/L — ABNORMAL LOW (ref 101–111)
Creatinine, Ser: 1.29 mg/dL — ABNORMAL HIGH (ref 0.44–1.00)
GFR calc Af Amer: 49 mL/min — ABNORMAL LOW (ref >60)
GFR calc non Af Amer: 42 mL/min — ABNORMAL LOW (ref >60)
Glucose, Bld: 104 mg/dL — ABNORMAL HIGH (ref 65–99)
Potassium: 4 mmol/L (ref 3.5–5.1)
Sodium: 139 mmol/L (ref 135–145)

## 2017-09-27 NOTE — Progress Notes (Signed)
Progress Note  Patient Name: Deborah Ho Date of Encounter: 09/27/2017  Primary Cardiologist: Dr. [name]  Subjective   Feels pretty well, denies sodium or fluid indiscretion pta. Says she was weighing daily and her weight would spike up very suddenly, leading to admission. Says metolazone did not pull the fluid off. Says dry weight 205  Inpatient Medications    Scheduled Meds: . atorvastatin  80 mg Oral q1800  . enoxaparin (LOVENOX) injection  40 mg Subcutaneous Q24H  . ferrous sulfate  325 mg Oral TID WC  . loratadine  10 mg Oral Daily  . losartan  100 mg Oral Daily  . magnesium oxide  400 mg Oral BID  . potassium chloride  40 mEq Oral BID  . sodium chloride flush  3 mL Intravenous Q12H  . spironolactone  25 mg Oral Daily  . ticagrelor  60 mg Oral BID   Continuous Infusions: . sodium chloride 250 mL (09/26/17 1910)  . furosemide (LASIX) infusion 8 mg/hr (09/26/17 1439)   PRN Meds: sodium chloride, acetaminophen, albuterol, ALPRAZolam, hydrALAZINE, HYDROcodone-acetaminophen, ondansetron (ZOFRAN) IV, sodium chloride flush   Vital Signs    Vitals:   09/26/17 0414 09/26/17 0754 09/26/17 2032 09/27/17 0517  BP: 128/83 136/87 (!) 122/91 123/78  Pulse: 87  84 84  Resp: 18  (!) 26 18  Temp: 97.7 F (36.5 C)  98.4 F (36.9 C) 97.8 F (36.6 C)  TempSrc: Oral  Oral Oral  SpO2: 97%  98%   Weight: 219 lb 3.2 oz (99.4 kg)   213 lb 4.8 oz (96.8 kg)  Height:        Intake/Output Summary (Last 24 hours) at 09/27/2017 0825 Last data filed at 09/27/2017 0541 Gross per 24 hour  Intake 522.8 ml  Output 4000 ml  Net -3477.2 ml   Filed Weights   09/25/17 0544 09/26/17 0414 09/27/17 0517  Weight: 221 lb 8 oz (100.5 kg) 219 lb 3.2 oz (99.4 kg) 213 lb 4.8 oz (96.8 kg)    Telemetry    Sinus rhythm.  Personally reviewed.  Physical Exam   GEN:  Obese.  No acute distress.   Neck: No JVD. Cardiac: RRR, no gallop.  Respiratory: Nonlabored. Clear to auscultation  bilaterally. GI: Soft, nontender, bowel sounds present. MS: trace leg edema; No deformity. Neuro:  Nonfocal. Psych: Alert and oriented x 3. Normal affect.  Labs    Chemistry Recent Labs  Lab 09/25/17 0505 09/26/17 0342 09/27/17 0352  NA 140 138 139  K 3.7 4.3 4.0  CL 98* 96* 100*  CO2 32 30 28  GLUCOSE 95 100* 104*  BUN 18 21* 21*  CREATININE 1.32* 1.24* 1.29*  CALCIUM 9.6 9.7 9.5  GFRNONAA 41* 44* 42*  GFRAA 48* 51* 49*  ANIONGAP 10 12 11      Hematology Recent Labs  Lab 09/22/17 1625 09/24/17 0408  WBC 8.6 7.8  RBC 5.56* 5.33*  HGB 14.6 13.8  HCT 46.8* 44.5  MCV 84.2 83.5  MCH 26.3 25.9*  MCHC 31.2 31.0  RDW 16.4* 16.6*  PLT 298 275    Cardiac Enzymes  Recent Labs  Lab 09/22/17 1650  TROPIPOC 0.02     Radiology    No results found.  Cardiac Studies   Echocardiogram 09/23/2017: Study Conclusions  - Procedure narrative: Transthoracic echocardiography. Image quality was suboptimal. The study was technically difficult, as a result of poor sound wave transmission and body habitus. - Left ventricle: The cavity size was moderately dilated. Wall thickness was  normal. Systolic function was severely reduced. The estimated ejection fraction was in the range of 20% to 25%. Severe diffuse hypokinesis with distinct regional wall motion abnormalities. Akinesis of the apical myocardium. Severe hypokinesis of the anteroseptal, anterior, and anterolateral myocardium; consistent with ischemia in the distribution of the left anterior descending coronary artery. Doppler parameters are consistent with restrictive physiology, indicative of decreased left ventricular diastolic compliance and/or increased left atrial pressure. Acoustic contrast opacification revealed no evidence ofthrombus. - Mitral valve: There was mild to moderate regurgitation directed centrally. - Right ventricle: The cavity size was dilated. Systolic function was  reduced. - Right atrium: The atrium was mildly dilated. - Tricuspid valve: There was moderate regurgitation. - Pulmonary arteries: Systolic pressure was moderately increased. PA peak pressure: 49 mm Hg (S).  Patient Profile     66 y.o. female with a history of CAD, ischemic cardiomyopathy with chronic combined heart failure, CKD stage III, hypertension, hyperlipidemia, and noncompliance.  She is currently admitted with acute on chronic combined heart failure and significant volume overload.  Assessment & Plan    1.  Acute on chronic systolic heart failure.  - I/O net neg 3.1 L 24 hr and 9 L since admit - weight down 19 lbs since admit - per pt, she is 8 lbs above dry wt of 205 - continue IV Lasix for now, but think can change to po soon.  2.  Ischemic cardiomyopathy with LVEF 20-25%.  - pta on minimal dose Coreg 3.125 mg qod>>on hold - on Aldactone 25 mg qd - pta was on Torsemide 60 mg qd, also has rx for Lasix 40 mg bid, need to clarify - may need to try Bumex once off IV Lasix  3.  CAD without active angina. - no ischemic sx - continue Brilinta, statin - no ASA>>allergy     4.  Hyperlipidemia - continue Lipitor  5.  CKD stage III - Cr 1.21 in May - at baseline on admit, up to 1.32, creatinine 1.24 at this time.  Signed, Theodore Demark, PA-C  09/27/2017, 8:25 AM    History and all data above reviewed.  Patient examined.  I agree with the findings as above.  She says she is not SOB.  She does have lower extremity swelling that is not yet at her baseline.  Feet are on the ground when I came into the room.  The patient exam reveals COR:RRR  ,  Lungs: clear  ,  Abd: Positive bowel sounds, no rebound no guarding, Ext Moderately severe edema to the thigh.     All available labs, radiology testing, previous records reviewed. Agree with documented assessment and plan. Acute on chronic systolic HF:  She has significant lower extremity volume to remove.  I talked to her about  keeping her feet elevated.  She will continue IV Lasix.  She is tolerating the increased dose of Cozaar.   Continue current therapy.    Fayrene Fearing Tyris Eliot  10:07 AM  09/27/2017

## 2017-09-27 NOTE — Progress Notes (Signed)
PROGRESS NOTE   Deborah Ho  JTT:[id number] DOB: Dec 06, 1950 DOA: 09/22/2017 PCP: Kerin Perna, NP   Specialists:   Brief Narrative:   75 ? EF 20-25% [has declined ICD in past and Entresto], Reflux, HLD,HTn, CAD STEMi 0092 complicated by stent thrombosis from noncompliance with Ticagrelor, Elevated total bili/alk phos ? Cause, CKD III Base weight on last d/c prior to home 86 kg  Readmitted with vol overload and wght 105 kg--states weight at [facility] was the same as home weights however Admits to indiscretion with diet and eating more salt than usual Not able to take brilnta daily Tells me is taking lasix 40 bid as scheudled but is not on Aldactone at home  Cardiology consulted given multiple hospital stays and patient being diuresed  Assessment & Plan:    Principal Problem:   Acute on chronic combined systolic and diastolic CHF (congestive heart failure) ([place]) Active Problems:   Essential hypertension   CKD (chronic kidney disease), stage III (HCC)   Decompensated sys hf ef 20-25% Lost 9 liters liter so far wght 104-->99Kg--->96 kg  lasix 80 bid->IV lasix gtt 8/h, restarted aldactone 25 qd--was on Torsemide 60 at some point-no changes in terms of diuretics today planning on making changes over the next 24-48 hours per cardiology Coreg 3.125 bd held in acute hf Entresto discussed but not taking at present Improved overall   Htn Reasonable control--held losartan 25 on admit  CAD-stemi 2015 with in stent thrombosis-non compliance Ticagrelor Continuing ticagrelor 60, allergic to aspirin apparently Resume BB and Losartan when able per cards Prn nitro if needed  Hypokalemia 3.0 Replacing with kdur 40 meq bid- K now 4.0  Cough Etiology unclear-resolved  DVT prophylaxis: lovenox Code Status:  full Family Communication:  None  Disposition Plan: inpatient   Consultants:   cardiology  Procedures:   none  Antimicrobials:   none     Subjective:  Overall well No new issues, eating drinking, no chest pain Keeping legs up as best as possible No fever no chills  Objective: Vitals:   09/26/17 2032 09/27/17 0517 09/27/17 0932 09/27/17 1422  BP: (!) 122/91 123/78 124/76 128/81  Pulse: 84 84  82  Resp: (!) _0 Temp: 98.4 F (36.9 C) 97.8 F (36.6 C)  (!) 97.5 F (36.4 C)  TempSrc: Oral Oral  Axillary  SpO2: 98%   100%  Weight:  96.8 kg (213 lb 4.8 oz)    Height:        Intake/Output Summary (Last 24 hours) at 09/27/2017 1525 Last data filed at 09/27/2017 1337 Gross per 24 hour  Intake 1193.68 ml  Output 3650 ml  Net -2456.32 ml   Filed Weights   09/25/17 0544 09/26/17 0414 09/27/17 0517  Weight: 100.5 kg (221 lb 8 oz) 99.4 kg (219 lb 3.2 oz) 96.8 kg (213 lb 4.8 oz)    Examination:  eomi ncat cta b, no added sound Thick neck no JVD Chest clinically clear Still has anasarca but decreased less swollen in thighs Neuro intact moving all 4 limbs  Data Reviewed: I have personally reviewed following labs and imaging studies  CBC: Recent Labs  Lab 09/22/17 1625 09/24/17 0408  WBC 8.6 7.8  NEUTROABS  --  5.4  HGB 14.6 13.8  HCT 46.8* 44.5  MCV 84.2 83.5  PLT 298 330   Basic Metabolic Panel: Recent Labs  Lab 09/22/17 1625 09/24/17 0408 09/25/17 0505 09/26/17 0342 09/27/17 0352  NA 142 139 140 138 139  K 3.6 3.0* 3.7 4.3 4.0  CL 107 99* 98* 96* 100*  CO2 25 27 32 30 28  GLUCOSE 102* 102* 95 100* 104*  BUN _0 21* 21*  CREATININE 1.23* 1.22* 1.32* 1.24* 1.29*  CALCIUM 9.2 9.4 9.6 9.7 9.5  MG  --   --   --  2.1  --    GFR: Estimated Creatinine Clearance: 49.4 mL/min (A) (by C-G formula based on SCr of 1.29 mg/dL (H)). Liver Function Tests: No results for input(s): AST, ALT, ALKPHOS, BILITOT, PROT, ALBUMIN in the last 168 hours. No results for input(s): LIPASE, AMYLASE in the last 168 hours. No results for input(s): AMMONIA in the last 168 hours. Coagulation  Profile: No results for input(s): INR, PROTIME in the last 168 hours. Cardiac Enzymes: No results for input(s): CKTOTAL, CKMB, CKMBINDEX, TROPONINI in the last 168 hours. BNP (last 3 results) No results for input(s): PROBNP in the last 8760 hours. HbA1C: No results for input(s): HGBA1C in the last 72 hours. CBG: No results for input(s): GLUCAP in the last 168 hours. Lipid Profile: No results for input(s): CHOL, HDL, LDLCALC, TRIG, CHOLHDL, LDLDIRECT in the last 72 hours. Thyroid Function Tests: No results for input(s): TSH, T4TOTAL, FREET4, T3FREE, THYROIDAB in the last 72 hours. Anemia Panel: No results for input(s): VITAMINB12, FOLATE, FERRITIN, TIBC, IRON, RETICCTPCT in the last 72 hours. Urine analysis:    Component Value Date/Time   COLORURINE YELLOW 01/28/2017 1850   APPEARANCEUR CLEAR 01/28/2017 1850   LABSPEC 1.011 01/28/2017 1850   PHURINE 5.0 01/28/2017 1850   GLUCOSEU NEGATIVE 01/28/2017 1850   HGBUR SMALL (A) 01/28/2017 1850   BILIRUBINUR NEGATIVE 01/28/2017 1850   KETONESUR NEGATIVE 01/28/2017 1850   PROTEINUR 30 (A) 01/28/2017 1850   UROBILINOGEN 1.0 07/25/2015 1601   NITRITE NEGATIVE 01/28/2017 1850   LEUKOCYTESUR NEGATIVE 01/28/2017 1850     Radiology Studies: Reviewed images personally in health database    Scheduled Meds: . atorvastatin  80 mg Oral q1800  . enoxaparin (LOVENOX) injection  40 mg Subcutaneous Q24H  . ferrous sulfate  325 mg Oral TID WC  . loratadine  10 mg Oral Daily  . losartan  100 mg Oral Daily  . magnesium oxide  400 mg Oral BID  . potassium chloride  40 mEq Oral BID  . sodium chloride flush  3 mL Intravenous Q12H  . spironolactone  25 mg Oral Daily  . ticagrelor  60 mg Oral BID   Continuous Infusions: . sodium chloride 250 mL (09/27/17 1226)  . furosemide (LASIX) infusion 8 mg/hr (09/27/17 1226)     LOS: 4 days    Time spent: [place], MD [facility]   If 7PM-7AM, please contact  night-coverage [email or web address] Password [email or web address] 09/27/2017, 3:25 PM

## 2017-09-27 NOTE — Care Management Important Message (Signed)
Important Message  Patient Details  Name: Deborah Ho MRN: [id number] Date of Birth: Feb 27, 1951   Medicare Important Message Given:  Yes    [name] 09/27/2017, 3:08 PM

## 2017-09-28 LAB — BASIC METABOLIC PANEL
Anion gap: 12 (ref 5–15)
BUN: 24 mg/dL — ABNORMAL HIGH (ref 6–20)
CO2: 28 mmol/L (ref 22–32)
Calcium: 9.9 mg/dL (ref 8.9–10.3)
Chloride: 99 mmol/L — ABNORMAL LOW (ref 101–111)
Creatinine, Ser: 1.41 mg/dL — ABNORMAL HIGH (ref 0.44–1.00)
GFR calc Af Amer: 44 mL/min — ABNORMAL LOW (ref >60)
GFR calc non Af Amer: 38 mL/min — ABNORMAL LOW (ref >60)
Glucose, Bld: 102 mg/dL — ABNORMAL HIGH (ref 65–99)
Potassium: 4.3 mmol/L (ref 3.5–5.1)
Sodium: 139 mmol/L (ref 135–145)

## 2017-09-28 MED ORDER — FUROSEMIDE 80 MG PO TABS
80.0000 mg | ORAL_TABLET | Freq: Two times a day (BID) | ORAL | Status: DC
  Administered 2017-09-28 – 2017-09-29 (×3): 80 mg via ORAL
  Filled 2017-09-28 (×3): qty 1

## 2017-09-28 NOTE — Progress Notes (Signed)
PROGRESS NOTE   Deborah Ho  YFR:[id number] DOB: [birth year] DOA: 09/22/2017 PCP: Kerin Perna, NP   Specialists:   Brief Narrative:   8 ? EF 20-25% [has declined ICD in past and Entresto], Reflux, HLD,HTn, CAD STEMi 6701 complicated by stent thrombosis from noncompliance with Ticagrelor, Elevated total bili/alk phos ? Cause, CKD III Base weight on last d/c prior to home 86 kg  Readmitted with vol overload and wght 105 kg--states weight at [facility] was the same as home weights however Admits to indiscretion with diet and eating more salt than usual Not taking brilinta daily Tells me is taking lasix 40 bid as scheudled but is not on Aldactone at home  Cardiology consulted given multiple hospital stays and patient being diuresed  Assessment & Plan:    Principal Problem:   Acute on chronic combined systolic and diastolic CHF (congestive heart failure) (HCC) Active Problems:   Essential hypertension   CKD (chronic kidney disease), stage III (HCC)   Decompensated sys hf ef 20-25% Lost 11.5 liters liter so far wght 104-->99Kg--->96-->95.5  lasix 80 bid->IV lasix gtt 8/h-->lasox 80 po bid as per Cardiology 11/13, restarted aldactone 25 qd--was on Torsemide 60 at some point prior to discharge Entresto discussed but not taking at present Improved overall  Cardiology to kindly determine and delineate what discharge medications would look like specifically to comment on Entresto  Htn Reasonable control--held losartan 25 on admit Restarted 11/10 at 100 Coreg 3.125 bd held in acute hf-Per cardiology  CAD-stemi 2015 with in stent thrombosis-non compliance Ticagrelor Continuing ticagrelor 60, allergic to aspirin apparently Resume BB  per cards Prn nitro if needed  Hypokalemia 3.0 Replacing with kdur 40 meq bid K now 4.0  Cough Etiology unclear-resolved  DVT prophylaxis: lovenox Code Status:  full Family Communication:  None  Disposition Plan: nearing d/c--I have  asked her to ensure that a relative can come by and learn about her meds and how to manage her medications going forward-she has a lot of unfounded fears about medications and fear that certain things "interact" when they do not actually do that and my concern is that she will be noncompliant with meds unless closely supervised  Consultants:   cardiology  Procedures:   none  Antimicrobials:   none    Subjective:  Fair no n/v/cp LE's swollen but less  Objective: Vitals:   09/27/17 2145 09/27/17 2357 09/28/17 0353 09/28/17 0926  BP: 117/71 134/82 112/73 116/74  Pulse: 70 94 81   Resp:  20    Temp: 98.1 F (36.7 C) 99.6 F (37.6 C) 98.1 F (36.7 C)   TempSrc: Oral Oral Oral   SpO2: 97% 97% 97%   Weight:   95.5 kg (210 lb 8 oz)   Height:        Intake/Output Summary (Last 24 hours) at 09/28/2017 1251 Last data filed at 09/28/2017 1248 Gross per 24 hour  Intake 1252.34 ml  Output 3950 ml  Net -2697.66 ml   Filed Weights   09/26/17 0414 09/27/17 0517 09/28/17 0353  Weight: 99.4 kg (219 lb 3.2 oz) 96.8 kg (213 lb 4.8 oz) 95.5 kg (210 lb 8 oz)    Examination:  eomi ncat cta b, no added sound Thick neck no JVD Chest clinically clear Still has anasarca  less swollen in thighs Neuro intact moving all 4 limbs  Data Reviewed: I have personally reviewed following labs and imaging studies  CBC: Recent Labs  Lab 09/22/17 1625 09/24/17 0408  WBC 8.6  7.8  NEUTROABS  --  5.4  HGB 14.6 13.8  HCT 46.8* 44.5  MCV 84.2 83.5  PLT 298 742   Basic Metabolic Panel: Recent Labs  Lab 09/24/17 0408 09/25/17 0505 09/26/17 0342 09/27/17 0352 09/28/17 0533  NA 139 140 138 139 139  K 3.0* 3.7 4.3 4.0 4.3  CL 99* 98* 96* 100* 99*  CO2 27 32 30 28 28   GLUCOSE 102* 95 100* 104* 102*  BUN 14 18 21* 21* 24*  CREATININE 1.22* 1.32* 1.24* 1.29* 1.41*  CALCIUM 9.4 9.6 9.7 9.5 9.9  MG  --   --  2.1  --   --    GFR: Estimated Creatinine Clearance: 44.9 mL/min (A) (by C-G  formula based on SCr of 1.41 mg/dL (H)). Liver Function Tests: No results for input(s): AST, ALT, ALKPHOS, BILITOT, PROT, ALBUMIN in the last 168 hours. No results for input(s): LIPASE, AMYLASE in the last 168 hours. No results for input(s): AMMONIA in the last 168 hours. Coagulation Profile: No results for input(s): INR, PROTIME in the last 168 hours. Cardiac Enzymes: No results for input(s): CKTOTAL, CKMB, CKMBINDEX, TROPONINI in the last 168 hours. BNP (last 3 results) No results for input(s): PROBNP in the last 8760 hours. HbA1C: No results for input(s): HGBA1C in the last 72 hours. CBG: No results for input(s): GLUCAP in the last 168 hours. Lipid Profile: No results for input(s): CHOL, HDL, LDLCALC, TRIG, CHOLHDL, LDLDIRECT in the last 72 hours. Thyroid Function Tests: No results for input(s): TSH, T4TOTAL, FREET4, T3FREE, THYROIDAB in the last 72 hours. Anemia Panel: No results for input(s): VITAMINB12, FOLATE, FERRITIN, TIBC, IRON, RETICCTPCT in the last 72 hours. Urine analysis:    Component Value Date/Time   COLORURINE YELLOW 01/28/2017 1850   APPEARANCEUR CLEAR 01/28/2017 1850   LABSPEC 1.011 01/28/2017 1850   PHURINE 5.0 01/28/2017 1850   GLUCOSEU NEGATIVE 01/28/2017 1850   HGBUR SMALL (A) 01/28/2017 1850   BILIRUBINUR NEGATIVE 01/28/2017 1850   KETONESUR NEGATIVE 01/28/2017 1850   PROTEINUR 30 (A) 01/28/2017 1850   UROBILINOGEN 1.0 07/25/2015 1601   NITRITE NEGATIVE 01/28/2017 1850   LEUKOCYTESUR NEGATIVE 01/28/2017 1850     Radiology Studies: Reviewed images personally in health database    Scheduled Meds: . atorvastatin  80 mg Oral q1800  . enoxaparin (LOVENOX) injection  40 mg Subcutaneous Q24H  . ferrous sulfate  325 mg Oral TID WC  . furosemide  80 mg Oral BID  . loratadine  10 mg Oral Daily  . losartan  100 mg Oral Daily  . magnesium oxide  400 mg Oral BID  . potassium chloride  40 mEq Oral BID  . sodium chloride flush  3 mL Intravenous Q12H  .  spironolactone  25 mg Oral Daily  . ticagrelor  60 mg Oral BID   Continuous Infusions: . sodium chloride Stopped (09/28/17 1242)     LOS: 5 days    Time spent: [place], MD [facility] ([facility]   If 7PM-7AM, please contact night-coverage [email or web address] Password [email or web address] 09/28/2017, 12:51 PM

## 2017-09-28 NOTE — Progress Notes (Signed)
Progress Note  Patient Name: Deborah Ho Date of Encounter: 09/28/2017  Primary Cardiologist: Dr. Shirlee Latch  Subjective   Pt states she feels near her dry weight, but states her dry weight is closer to 176 lbs.  Inpatient Medications    Scheduled Meds: . atorvastatin  80 mg Oral q1800  . enoxaparin (LOVENOX) injection  40 mg Subcutaneous Q24H  . ferrous sulfate  325 mg Oral TID WC  . loratadine  10 mg Oral Daily  . losartan  100 mg Oral Daily  . magnesium oxide  400 mg Oral BID  . potassium chloride  40 mEq Oral BID  . sodium chloride flush  3 mL Intravenous Q12H  . spironolactone  25 mg Oral Daily  . ticagrelor  60 mg Oral BID   Continuous Infusions: . sodium chloride 250 mL (09/27/17 1226)  . furosemide (LASIX) infusion 8 mg/hr (09/28/17 0928)   PRN Meds: sodium chloride, acetaminophen, albuterol, ALPRAZolam, hydrALAZINE, HYDROcodone-acetaminophen, ondansetron (ZOFRAN) IV, sodium chloride flush   Vital Signs    Vitals:   09/27/17 1422 09/27/17 2145 09/27/17 2357 09/28/17 0353  BP: 128/81 117/71 134/82 112/73  Pulse: 82 70 94 81  Resp: 18  20   Temp: (!) 97.5 F (36.4 C) 98.1 F (36.7 C) 99.6 F (37.6 C) 98.1 F (36.7 C)  TempSrc: Axillary Oral Oral Oral  SpO2: 100% 97% 97% 97%  Weight:    210 lb 8 oz (95.5 kg)  Height:        Intake/Output Summary (Last 24 hours) at 09/28/2017 0947 Last data filed at 09/28/2017 7209 Gross per 24 hour  Intake 1375.17 ml  Output 4250 ml  Net -2874.83 ml   Filed Weights   09/26/17 0414 09/27/17 0517 09/28/17 0353  Weight: 219 lb 3.2 oz (99.4 kg) 213 lb 4.8 oz (96.8 kg) 210 lb 8 oz (95.5 kg)     Physical Exam   General: Well developed, well nourished, female appearing in no acute distress. Head: Normocephalic, atraumatic.  Neck: Supple without bruits, JVD Lungs:  Resp regular and unlabored, CTA. Heart: RRR S1, S2, no murmur; no rub. Abdomen: Soft, non-tender, non-distended with normoactive bowel sounds. No  hepatomegaly. No rebound/guarding. No obvious abdominal masses. Extremities: No clubbing, cyanosis, thighs with trace edema. Distal pedal pulses are 1+ bilaterally. Left hand wrapped for IV placement not infiltrated  Neuro: Alert and oriented X 3. Moves all extremities spontaneously. Psych: Normal affect.  Labs    Chemistry Recent Labs  Lab 09/26/17 0342 09/27/17 0352 09/28/17 0533  NA 138 139 139  K 4.3 4.0 4.3  CL 96* 100* 99*  CO2 30 28 28   GLUCOSE 100* 104* 102*  BUN 21* 21* 24*  CREATININE 1.24* 1.29* 1.41*  CALCIUM 9.7 9.5 9.9  GFRNONAA 44* 42* 38*  GFRAA 51* 49* 44*  ANIONGAP 12 11 12      Hematology Recent Labs  Lab 09/22/17 1625 09/24/17 0408  WBC 8.6 7.8  RBC 5.56* 5.33*  HGB 14.6 13.8  HCT 46.8* 44.5  MCV 84.2 83.5  MCH 26.3 25.9*  MCHC 31.2 31.0  RDW 16.4* 16.6*  PLT 298 275    Cardiac EnzymesNo results for input(s): TROPONINI in the last 168 hours.  Recent Labs  Lab 09/22/17 1650  TROPIPOC 0.02     BNPNo results for input(s): BNP, PROBNP in the last 168 hours.   DDimer No results for input(s): DDIMER in the last 168 hours.   Radiology    No results found.   Telemetry  Sinus with PVCs - Personally Reviewed  ECG    No new tracings - Personally Reviewed   Cardiac Studies   Echocardiogram 09/23/2017: Study Conclusions  - Procedure narrative: Transthoracic echocardiography. Image quality was suboptimal. The study was technically difficult, as a result of poor sound wave transmission and body habitus. - Left ventricle: The cavity size was moderately dilated. Wall thickness was normal. Systolic function was severely reduced. The estimated ejection fraction was in the range of 20% to 25%. Severe diffuse hypokinesis with distinct regional wall motion abnormalities. Akinesis of the apical myocardium. Severe hypokinesis of the anteroseptal, anterior, and anterolateral myocardium; consistent with ischemia in the  distribution of the left anterior descending coronary artery. Doppler parameters are consistent with restrictive physiology, indicative of decreased left ventricular diastolic compliance and/or increased left atrial pressure. Acoustic contrast opacification revealed no evidence ofthrombus. - Mitral valve: There was mild to moderate regurgitation directed centrally. - Right ventricle: The cavity size was dilated. Systolic function was reduced. - Right atrium: The atrium was mildly dilated. - Tricuspid valve: There was moderate regurgitation. - Pulmonary arteries: Systolic pressure was moderately increased. PA peak pressure: 49 mm Hg (S).  Patient Profile     66 y.o. female with a history of CAD, ischemic cardiomyopathy with chronic combined heart failure, CKD stage III, hypertension, hyperlipidemia, and noncompliance. She is currently admitted with acute on chronic combined heart failure and significant volume overload.  Assessment & Plan    1. Acute on chronic systolic heart failure - pt diuresing on 8 mg/hr IV lasix drip - she is overall net negative 11.8L with 4.1 L urine output yesterday - weight 210 lbs from 230 lbs on admission - she states that her dry weight is 205 lbs - K is stable - given creatinine bump and overall clinical picture, will convert lasix drip to 80 mg PO lasix BID and follow I & Os and daily weights - she will likely need to stay on this dose as an outpatient, which is considerably more than her home dose   2. Ischemic cardiomyopathy with LVEF 20-25% - coreg on hold - continue aldactone   3. CAD  - denies chest pain - continue brilinta, statin - no ASA given allergy   4. HLD - continue lipitor   5. CKD stage III - baseline may be 1.21 (03/2017) - 1.41 (1.29) - considering this bump in creatinine, will consider switching to PO diuretic    Signed, Marcelino Duster , PA-C 9:47 AM 09/28/2017 Pager: [phone]  History  and all data above reviewed.  Patient examined.  I agree with the findings as above.  Again the feet are on the ground when I enter the room but she assures me that she has been keeping them up.  She had excellent UO yesterday.  She feels better and legs are swollen but reduced The patient exam reveals COR:RRR  ,  Lungs: Clear ,  Abd: Positive bowel sounds, no rebound no guarding, Ext Moderate to severe edema  .  All available labs, radiology testing, previous records reviewed. Agree with documented assessment and plan. Acute on chronic systolic HF:  I agree with changing to PO Lasix and watching for another 24 hours.  She and I talked about managing this at home.   She will need close TOC follow up and labs and we will likely send her home on twice her previous daily dose.    Fayrene Fearing Sotiria Keast  11:22 AM  09/28/2017

## 2017-09-29 ENCOUNTER — Other Ambulatory Visit: Payer: Self-pay | Admitting: Physician Assistant

## 2017-09-29 ENCOUNTER — Telehealth: Payer: Self-pay | Admitting: Cardiology

## 2017-09-29 DIAGNOSIS — I255 Ischemic cardiomyopathy: Secondary | ICD-10-CM

## 2017-09-29 LAB — LIPID PANEL
Cholesterol: 129 mg/dL (ref 0–200)
HDL: 34 mg/dL — ABNORMAL LOW (ref >40)
LDL Cholesterol: 82 mg/dL (ref 0–99)
Total CHOL/HDL Ratio: 3.8 RATIO
Triglycerides: 64 mg/dL (ref <150)
VLDL: 13 mg/dL (ref 0–40)

## 2017-09-29 LAB — BASIC METABOLIC PANEL
Anion gap: 10 (ref 5–15)
BUN: 27 mg/dL — ABNORMAL HIGH (ref 6–20)
CO2: 30 mmol/L (ref 22–32)
Calcium: 9.8 mg/dL (ref 8.9–10.3)
Chloride: 100 mmol/L — ABNORMAL LOW (ref 101–111)
Creatinine, Ser: 1.52 mg/dL — ABNORMAL HIGH (ref 0.44–1.00)
GFR calc Af Amer: 40 mL/min — ABNORMAL LOW (ref >60)
GFR calc non Af Amer: 35 mL/min — ABNORMAL LOW (ref >60)
Glucose, Bld: 95 mg/dL (ref 65–99)
Potassium: 4.6 mmol/L (ref 3.5–5.1)
Sodium: 140 mmol/L (ref 135–145)

## 2017-09-29 LAB — HEMOGLOBIN A1C
Hgb A1c MFr Bld: 6.2 % — ABNORMAL HIGH (ref 4.8–5.6)
Mean Plasma Glucose: 131.24 mg/dL

## 2017-09-29 MED ORDER — FUROSEMIDE 40 MG PO TABS: 60.0000 mg | ORAL_TABLET | Freq: Two times a day (BID) | ORAL | 0 refills | Status: DC

## 2017-09-29 NOTE — Progress Notes (Signed)
Patient received discharge information and acknowledged understanding of it. Patient received prescription. Patient IV was removed.

## 2017-09-29 NOTE — Telephone Encounter (Signed)
TCM - patient currently admitted

## 2017-09-29 NOTE — Care Management Note (Signed)
Case Management Note  Patient Details  Name: Deborah Ho MRN: [id number] Date of Birth: 12/22/1950  Subjective/Objective:   Pt presented for Acute on Chronic CHF. PTA from home alone with the support of family. Per pt she has utilized [facility] in the past, but feels like she will not need HH Services at this time.                  Action/Plan: HH RN Orders placed in Epic and pt has declined services at this time. CM did make pt aware that if once she gets home and feels like she will benefit from [facility] RN to please call PCP. CM did speak with pt in regards to medications and affordability and ease of getting them. Pt denied having any issues with medications and states that she has been compliant with medications. No further needs identified at this time.   Expected Discharge Date:  09/29/17               Expected Discharge Plan:  Home/Self Care  In-House Referral:  NA  Discharge planning Services  CM Consult  Post Acute Care Choice:  Home Health Choice offered to:  NA  DME Arranged:  N/A DME Agency:  NA  HH Arranged:  Patient Refused HH HH Agency:  NA  Status of Service:  Completed, signed off  If discussed at Long Length of Stay Meetings, dates discussed:    Additional Comments:  Gala Lewandowsky, RN 09/29/2017, 1:01 PM

## 2017-09-29 NOTE — Discharge Summary (Signed)
Physician Discharge Summary  Deborah Ho  YQI:[id number]  DOB: [birth year]  DOA: 09/22/2017 PCP: Grayce Sessions, NP  Admit date: 09/22/2017 Discharge date: 09/29/2017  Admitted From: Home  Disposition:  Home   Recommendations for Outpatient Follow-up:  1. Follow up with PCP in 1-2 weeks 2. Follow up with cardiology on 10/04/17 3. Please obtain BMP/CBC in one week to monitor renal function and Hgb  4. [facility] in [place], for compression stocking.  5. Lasix 60 mg BID with 20 mg PRN increase in weight   Discharge Condition: Stable   CODE STATUS: Full Code  Diet recommendation: Heart Healthy   Brief/Interim Summary: For full details see H&P/progress note but in brief, Deborah Ho is a 66 year old female with medical history significant for coronary artery disease, chronic kidney disease stage III and chronic CHF who presented to the emergency department complaining of shortness of breath and dyspnea.  Upon ED evaluation she was found to be on CHF exacerbation and she was admitted for IV diuresis.  Subjective: Patient seen and examined, continue to to well. She is off oxygen, denies chest pain, sob, palpitations and leg swelling. No acute events overnight. Cardiology cleared for discharge   Discharge Diagnoses/Hospital Course:  Acute on chronic systolic and diastolic CHF EF of 20-25% Negative balance ~13 L Weight is down 22 pounds from admission Off oxygen Mild increase in creatinine overnight due to aggressive diuresis Per cardiology recommending to discharge on Lasix 60 mg p.o. twice daily and 20 mg tablets to take as needed if weight increases more than 3 pounds. She is to follow-up on 10/04/17 with cardiology for labs and medication adjustment Resume Coreg, continue Aldactone.  Holding losartan for now until renal function is re-evaluated  Advised to keep legs elevated and use compression stocking   CAD Asymptomatic, no chest pain. Continue Brilinta and  statin  Hypertension BP stable  Per cardiology - can resume Coreg  Losartan will remain on hold until seen by cardiology on 10/04/17  Hypokalemia - resolved  Continue Kdur while on Lasix   CKD stage III  Cr around baseline - had mild increase on the day of discharge, felt to be due to diuresis.  Check BMP in 1 week.  Losartan on hold   All other chronic medical condition were stable during the hospitalization.  On the day of the discharge the patient's vitals were stable, and no other acute medical condition were reported by patient. the patient was felt safe to be discharge to home   Discharge Instructions  You were cared for by a hospitalist during your hospital stay. If you have any questions about your discharge medications or the care you received while you were in the hospital after you are discharged, you can call the unit and asked to speak with the hospitalist on call if the hospitalist that took care of you is not available. Once you are discharged, your primary care physician will handle any further medical issues. Please note that NO REFILLS for any discharge medications will be authorized once you are discharged, as it is imperative that you return to your primary care physician (or establish a relationship with a primary care physician if you do not have one) for your aftercare needs so that they can reassess your need for medications and monitor your lab values.  Discharge Instructions    Call MD for:  difficulty breathing, headache or visual disturbances   Complete by:  As directed    Call MD for:  extreme fatigue   Complete by:  As directed    Call MD for:  hives   Complete by:  As directed    Call MD for:  persistant dizziness or light-headedness   Complete by:  As directed    Call MD for:  persistant nausea and vomiting   Complete by:  As directed    Call MD for:  redness, tenderness, or signs of infection (pain, swelling, redness, odor or green/yellow discharge  around incision site)   Complete by:  As directed    Call MD for:  severe uncontrolled pain   Complete by:  As directed    Call MD for:  temperature >100.4   Complete by:  As directed    Diet - low sodium heart healthy   Complete by:  As directed    Discharge instructions   Complete by:  As directed    Hold Losartan until seen by cardiology on 10/04/17   Increase activity slowly   Complete by:  As directed      Allergies as of 09/29/2017      Reactions   Aspirin Swelling   Chewable children's aspirin makes patients tongue and face swell   Effient [prasugrel] Swelling   Patient's tongue and face swells   Entresto [sacubitril-valsartan] Other (See Comments)   dizziness   Lactose Intolerance (gi) Other (See Comments)   REACTION: stomach upset   Robitussin Dm [guaifenesin-dm] Swelling   Patient's tongue swells   Sulfa Antibiotics Swelling   Other    Had to replace "catgut" with clamps   Wheat Bran Other (See Comments)   REACTION: unknown      Medication List    STOP taking these medications   losartan 25 MG tablet Commonly known as:  COZAAR     TAKE these medications   atorvastatin 80 MG tablet Commonly known as:  LIPITOR Take 1 tablet (80 mg total) by mouth daily at 6 PM.   carvedilol 3.125 MG tablet Commonly known as:  COREG Take 3.125 mg every other day by mouth.   cetirizine 10 MG tablet Commonly known as:  ZYRTEC Take 10 mg daily by mouth.   cholecalciferol 1000 units tablet Commonly known as:  VITAMIN D Take 1,000 Units by mouth. Twice a month   ferrous sulfate 325 (65 FE) MG tablet Take 325 mg 3 (three) times daily by mouth. Monday,Wednesday, friday   furosemide 40 MG tablet Commonly known as:  LASIX Take 1.5 tablets (60 mg total) 2 (two) times daily by mouth. What changed:  how much to take   magnesium oxide 400 (241.3 Mg) MG tablet Commonly known as:  MAG-OX Take 400 mg by mouth. Twice a month   nitroGLYCERIN 0.4 MG SL tablet Commonly known  as:  NITROSTAT Place 0.4 mg under the tongue every 5 (five) minutes as needed for chest pain.   potassium chloride SA 20 MEQ tablet Commonly known as:  K-DUR,KLOR-CON Take 1 tablet (20 mEq total) by mouth daily.   spironolactone 25 MG tablet Commonly known as:  ALDACTONE Take 1 tablet (25 mg total) by mouth daily.   ticagrelor 60 MG Tabs tablet Commonly known as:  BRILINTA Take 1 tablet (60 mg total) by mouth 2 (two) times daily. What changed:  when to take this      Follow-up Information    [name], PA-C Follow up on 10/04/2017.   Specialties:  Cardiology, Radiology Why:  TCM at 11:00am Contact information: [address] [place]  [place] [address] [phone]        Grayce Sessions, NP. Schedule an appointment as soon as possible for a visit in 1 week(s).   Specialty:  Internal Medicine Why:  Hospital follow up  Contact information: [address] [place] [address] [phone]          Allergies  Allergen Reactions  . Aspirin Swelling    Chewable children's aspirin makes patients tongue and face swell  . Effient [Prasugrel] Swelling    Patient's tongue and face swells  . Entresto [Sacubitril-Valsartan] Other (See Comments)    dizziness  . Lactose Intolerance (Gi) Other (See Comments)    REACTION: stomach upset  . Robitussin Dm [Guaifenesin-Dm] Swelling    Patient's tongue swells  . Sulfa Antibiotics Swelling  . Other     Had to replace "catgut" with clamps  . Wheat Bran Other (See Comments)    REACTION: unknown    Consultations:  Cardiology    Procedures/Studies: Dg Chest 2 View  Result Date: 09/22/2017 CLINICAL DATA:  CHF, swelling. Pt. Has swelling from chest and abdomen through both knees x 2 days. No chest pain or SOB. Hx of CHF EXAM: CHEST  2 VIEW COMPARISON:  03/26/2017 FINDINGS: Heart is mildly enlarged and stable in configuration. There is persistent right pleural effusion or pleural thickening and right  basilar atelectasis or scarring, stable in appearance. There are no focal consolidations. No pulmonary edema. IMPRESSION: 1. Stable cardiomegaly.  No edema. 2. Stable changes at the right lung base, compatible with scarring and pleural thickening. Electronically Signed   By: Norva Pavlov M.D.   On: 09/22/2017 16:56   ECHO 09/23/17  ------------------------------------------------------------------- Study Conclusions  - Procedure narrative: Transthoracic echocardiography. Image   quality was suboptimal. The study was technically difficult, as a   result of poor sound wave transmission and body habitus. - Left ventricle: The cavity size was moderately dilated. Wall   thickness was normal. Systolic function was severely reduced. The   estimated ejection fraction was in the range of 20% to 25%.   Severe diffuse hypokinesis with distinct regional wall motion   abnormalities. Akinesis of the apical myocardium. Severe   hypokinesis of the anteroseptal, anterior, and anterolateral   myocardium; consistent with ischemia in the distribution of the   left anterior descending coronary artery. Doppler parameters are   consistent with restrictive physiology, indicative of decreased   left ventricular diastolic compliance and/or increased left   atrial pressure. Acoustic contrast opacification revealed no   evidence ofthrombus. - Mitral valve: There was mild to moderate regurgitation directed   centrally. - Right ventricle: The cavity size was dilated. Systolic function   was reduced. - Right atrium: The atrium was mildly dilated. - Tricuspid valve: There was moderate regurgitation. - Pulmonary arteries: Systolic pressure was moderately increased.   PA peak pressure: 49 mm Hg (S).   Discharge Exam: Vitals:   09/28/17 2014 09/29/17 0457  BP: 121/81 118/70  Pulse: 84 83  Resp: 18 18  Temp: 97.6 F (36.4 C) 97.7 F (36.5 C)  SpO2: 99% 99%   Vitals:   09/28/17 0926 09/28/17 1456  09/28/17 2014 09/29/17 0457  BP: 116/74 113/70 121/81 118/70  Pulse:  83 84 83  Resp:   18 18  Temp:  97.6 F (36.4 C) 97.6 F (36.4 C) 97.7 F (36.5 C)  TempSrc:  Oral Oral Oral  SpO2:  100% 99% 99%  Weight:    93.8 kg (206 lb 11.2 oz)  Height:        General: Pt is alert, awake, not in acute distress Cardiovascular: RRR, S1/S2 +, no rubs, no gallops Respiratory: CTA bilaterally, no wheezing, no rhonchi Abdominal: Soft, NT, ND, bowel sounds + Extremities: LE edema B/L 1+  The results of significant diagnostics from this hospitalization (including imaging, microbiology, ancillary and laboratory) are listed below for reference.     Microbiology: No results found for this or any previous visit (from the past 240 hour(s)).   Labs: BNP (last 3 results) Recent Labs    01/28/17 2047 03/26/17 1405  BNP 1,920.0* 1,679.1*   Basic Metabolic Panel: Recent Labs  Lab 09/25/17 0505 09/26/17 0342 09/27/17 0352 09/28/17 0533 09/29/17 0415  NA 140 138 139 139 140  K 3.7 4.3 4.0 4.3 4.6  CL 98* 96* 100* 99* 100*  CO2 32 30 28 28 30   GLUCOSE 95 100* 104* 102* 95  BUN 18 21* 21* 24* 27*  CREATININE 1.32* 1.24* 1.29* 1.41* 1.52*  CALCIUM 9.6 9.7 9.5 9.9 9.8  MG  --  2.1  --   --   --    Liver Function Tests: No results for input(s): AST, ALT, ALKPHOS, BILITOT, PROT, ALBUMIN in the last 168 hours. No results for input(s): LIPASE, AMYLASE in the last 168 hours. No results for input(s): AMMONIA in the last 168 hours. CBC: Recent Labs  Lab 09/22/17 1625 09/24/17 0408  WBC 8.6 7.8  NEUTROABS  --  5.4  HGB 14.6 13.8  HCT 46.8* 44.5  MCV 84.2 83.5  PLT 298 275   Cardiac Enzymes: No results for input(s): CKTOTAL, CKMB, CKMBINDEX, TROPONINI in the last 168 hours. BNP: Invalid input(s): POCBNP CBG: No results for input(s): GLUCAP in the last 168 hours. D-Dimer No results for input(s): DDIMER in the last 72 hours. Hgb A1c Recent Labs    09/29/17 0415  HGBA1C 6.2*    Lipid Profile Recent Labs    09/29/17 0415  CHOL 129  HDL 34*  LDLCALC 82  TRIG 64  CHOLHDL 3.8   Thyroid function studies No results for input(s): TSH, T4TOTAL, T3FREE, THYROIDAB in the last 72 hours.  Invalid input(s): FREET3 Anemia work up No results for input(s): VITAMINB12, FOLATE, FERRITIN, TIBC, IRON, RETICCTPCT in the last 72 hours. Urinalysis    Component Value Date/Time   COLORURINE YELLOW 01/28/2017 1850   APPEARANCEUR CLEAR 01/28/2017 1850   LABSPEC 1.011 01/28/2017 1850   PHURINE 5.0 01/28/2017 1850   GLUCOSEU NEGATIVE 01/28/2017 1850   HGBUR SMALL (A) 01/28/2017 1850   BILIRUBINUR NEGATIVE 01/28/2017 1850   KETONESUR NEGATIVE 01/28/2017 1850   PROTEINUR 30 (A) 01/28/2017 1850   UROBILINOGEN 1.0 07/25/2015 1601   NITRITE NEGATIVE 01/28/2017 1850   LEUKOCYTESUR NEGATIVE 01/28/2017 1850   Sepsis Labs Invalid input(s): PROCALCITONIN,  WBC,  LACTICIDVEN Microbiology No results found for this or any previous visit (from the past 240 hour(s)).   Time coordinating discharge: 35 minutes  SIGNED:  01/30/2017, MD  [facility] 09/29/2017, 12:05 PM  Pager please text page via  [email or web address] Password [email or web address]

## 2017-09-29 NOTE — Progress Notes (Signed)
Progress Note  Patient Name: Deborah Ho Date of Encounter: 09/29/2017  Primary Cardiologist: Dr. Shirlee Latch  Subjective   Patient is feeling well; denies chest pain, SOB, and palpitations. She wishes to discharge home.  Inpatient Medications    Scheduled Meds: . atorvastatin  80 mg Oral q1800  . enoxaparin (LOVENOX) injection  40 mg Subcutaneous Q24H  . ferrous sulfate  325 mg Oral TID WC  . furosemide  80 mg Oral BID  . loratadine  10 mg Oral Daily  . losartan  100 mg Oral Daily  . magnesium oxide  400 mg Oral BID  . potassium chloride  40 mEq Oral BID  . sodium chloride flush  3 mL Intravenous Q12H  . spironolactone  25 mg Oral Daily  . ticagrelor  60 mg Oral BID   Continuous Infusions: . sodium chloride Stopped (09/28/17 1242)   PRN Meds: sodium chloride, acetaminophen, albuterol, ALPRAZolam, hydrALAZINE, HYDROcodone-acetaminophen, ondansetron (ZOFRAN) IV, sodium chloride flush   Vital Signs    Vitals:   09/28/17 0926 09/28/17 1456 09/28/17 2014 09/29/17 0457  BP: 116/74 113/70 121/81 118/70  Pulse:  83 84 83  Resp:   18 18  Temp:  97.6 F (36.4 C) 97.6 F (36.4 C) 97.7 F (36.5 C)  TempSrc:  Oral Oral Oral  SpO2:  100% 99% 99%  Weight:    206 lb 11.2 oz (93.8 kg)  Height:        Intake/Output Summary (Last 24 hours) at 09/29/2017 0953 Last data filed at 09/29/2017 0500 Gross per 24 hour  Intake 1124.81 ml  Output 2250 ml  Net -1125.19 ml   Filed Weights   09/27/17 0517 09/28/17 0353 09/29/17 0457  Weight: 213 lb 4.8 oz (96.8 kg) 210 lb 8 oz (95.5 kg) 206 lb 11.2 oz (93.8 kg)     Physical Exam   General: Well developed, well nourished, female appearing in no acute distress. Head: Normocephalic, atraumatic.  Neck: Supple without bruits, no JVD, exam difficult Lungs:  Resp regular and unlabored, CTA in upper lobes, occasional crackle in bases Heart: RRR, S1, S2, no murmur; no rub. Abdomen: Soft, non-tender, non-distended with normoactive bowel  sounds. No hepatomegaly. No rebound/guarding. No obvious abdominal masses. Extremities: No clubbing, cyanosis, improving LE edema. Distal pedal pulses are 2+ bilaterally. Neuro: Alert and oriented X 3. Moves all extremities spontaneously. Psych: Normal affect.  Labs    Chemistry Recent Labs  Lab 09/27/17 0352 09/28/17 0533 09/29/17 0415  NA 139 139 140  K 4.0 4.3 4.6  CL 100* 99* 100*  CO2 28 28 30   GLUCOSE 104* 102* 95  BUN 21* 24* 27*  CREATININE 1.29* 1.41* 1.52*  CALCIUM 9.5 9.9 9.8  GFRNONAA 42* 38* 35*  GFRAA 49* 44* 40*  ANIONGAP 11 12 10      Hematology Recent Labs  Lab 09/22/17 1625 09/24/17 0408  WBC 8.6 7.8  RBC 5.56* 5.33*  HGB 14.6 13.8  HCT 46.8* 44.5  MCV 84.2 83.5  MCH 26.3 25.9*  MCHC 31.2 31.0  RDW 16.4* 16.6*  PLT 298 275    Cardiac EnzymesNo results for input(s): TROPONINI in the last 168 hours.  Recent Labs  Lab 09/22/17 1650  TROPIPOC 0.02     BNPNo results for input(s): BNP, PROBNP in the last 168 hours.   DDimer No results for input(s): DDIMER in the last 168 hours.   Radiology    No results found.   Telemetry    sinus - Personally Reviewed  ECG  No new tracings - Personally Reviewed   Cardiac Studies   Echocardiogram 09/23/2017: Study Conclusions - Procedure narrative: Transthoracic echocardiography. Image quality was suboptimal. The study was technically difficult, as a result of poor sound wave transmission and body habitus. - Left ventricle: The cavity size was moderately dilated. Wall thickness was normal. Systolic function was severely reduced. The estimated ejection fraction was in the range of 20% to 25%. Severe diffuse hypokinesis with distinct regional wall motion abnormalities. Akinesis of the apical myocardium. Severe hypokinesis of the anteroseptal, anterior, and anterolateral myocardium; consistent with ischemia in the distribution of the left anterior descending coronary artery.  Doppler parameters are consistent with restrictive physiology, indicative of decreased left ventricular diastolic compliance and/or increased left atrial pressure. Acoustic contrast opacification revealed no evidence ofthrombus. - Mitral valve: There was mild to moderate regurgitation directed centrally. - Right ventricle: The cavity size was dilated. Systolic function was reduced. - Right atrium: The atrium was mildly dilated. - Tricuspid valve: There was moderate regurgitation. - Pulmonary arteries: Systolic pressure was moderately increased. PA peak pressure: 49 mm Hg (S).   Patient Profile     66 y.o. female with a history of CAD, ischemic cardiomyopathy with chronic combined heart failure, CKD stage III, hypertension, hyperlipidemia, and noncompliance. She is currently admitted with acute on chronic combined heart failure and significant volume overload.  Assessment & Plan    1. Acute on chronic systolic heart failure - pt was transitioned from lasix drip to 80 mg PO lasix yesterday - she is overall net negative 13L with very good urine output yesterday of 2.8L - weight today 206 lbs - she lose another 4 kg yesterday - off oxygen - OK to discharge from cardiology standpoint - creatinine increased overnight - recommend discharging on lower dose of 60 mg lasix PO BID with 20 mg tablets to take PRN if weight increases - she was instructed to return to [facility] for lab work on Monday 10/04/17 - discharge K was 4.6   2. Nonischemic cardiomyopathy - OK to restart low dose of home coreg - continue aldactone   3. CAD - continues to deny chest pain - continue brilinta and statin - she has an ASA allergy   4. HLD - continue lipitor   5. CKD stage III - sCr 1.52 (1.41) after transitioning to PO lasix from drip - baseline 1.21 (03/2017) - recheck BMP in 1 week    Follow up appt with cardiology scheduled. BMP on 10/04/17   Signed, Marcelino Duster ,  PA-C 9:53 AM 09/29/2017 Pager: [phone]  History and all data above reviewed.  Patient examined.  I agree with the findings as above. OK to go home.  I am not convinced that she will be compliant with lifestyle changes.  Every time I walk into her room her feet are on the floor.   The patient exam reveals COR:RRR  ,  Lungs: Clear  ,  Abd: Positive bowel sounds, no rebound no guarding, Ext Moderate edema  .  All available labs, radiology testing, previous records reviewed. Agree with documented assessment and plan. I think that she has reached the max benefit of this hospitalization.  She can go home with plans as above.  She can get compression stockings in [place] at [employer] # [phone].    Fayrene Fearing [facility]  10:27 AM  09/29/2017

## 2017-09-29 NOTE — Telephone Encounter (Signed)
New Message     TCM Corine Shelter 10/04/17 11a

## 2017-09-30 NOTE — Telephone Encounter (Signed)
D/c yesterday 09-29-17  09/22/2017 - 09/29/2017 (7 days) [facility]

## 2017-09-30 NOTE — Telephone Encounter (Signed)
Patient contacted regarding discharge from [place] on 09-29-17  Patient understands to follow up with provider kilroy on 10-04-17 at 11 am at [facility]. Patient understands discharge instructions? yes Patient understands medications and regiment? yes Patient understands to bring all medications to this visit? yes  Advised patient to use tylenol for leg pain instead of aspirin as long as she is taking brilinta. Patient voiced understanding.

## 2017-10-04 ENCOUNTER — Ambulatory Visit (INDEPENDENT_AMBULATORY_CARE_PROVIDER_SITE_OTHER): Payer: Medicare Other | Admitting: Cardiology

## 2017-10-04 ENCOUNTER — Encounter: Payer: Self-pay | Admitting: Cardiology

## 2017-10-04 VITALS — BP 141/88 | HR 93 | Ht 65.0 in | Wt 203.0 lb

## 2017-10-04 DIAGNOSIS — Z9114 Patient's other noncompliance with medication regimen: Secondary | ICD-10-CM | POA: Diagnosis not present

## 2017-10-04 DIAGNOSIS — Z79899 Other long term (current) drug therapy: Secondary | ICD-10-CM

## 2017-10-04 DIAGNOSIS — I255 Ischemic cardiomyopathy: Secondary | ICD-10-CM | POA: Diagnosis not present

## 2017-10-04 DIAGNOSIS — I252 Old myocardial infarction: Secondary | ICD-10-CM

## 2017-10-04 DIAGNOSIS — I5043 Acute on chronic combined systolic (congestive) and diastolic (congestive) heart failure: Secondary | ICD-10-CM | POA: Diagnosis not present

## 2017-10-04 DIAGNOSIS — N183 Chronic kidney disease, stage 3 unspecified: Secondary | ICD-10-CM

## 2017-10-04 DIAGNOSIS — I10 Essential (primary) hypertension: Secondary | ICD-10-CM

## 2017-10-04 MED ORDER — SPIRONOLACTONE 25 MG PO TABS: 25.0000 mg | ORAL_TABLET | Freq: Every day | ORAL | 3 refills | Status: DC

## 2017-10-04 NOTE — Assessment & Plan Note (Signed)
Ongoing problem- she has decided she doesn't want f/u with the heart failure clinic.

## 2017-10-04 NOTE — Assessment & Plan Note (Signed)
Controlled.  

## 2017-10-04 NOTE — Assessment & Plan Note (Signed)
F/u from recent admission-current wgt stable We assumed she was on Aldactone but in reality she was never taking this and is not taking it now.

## 2017-10-04 NOTE — Patient Instructions (Signed)
Medication Instructions:  STOP POTASSIUM  START SPIRONOLACTONE  If you need a refill on your cardiac medications before your next appointment, please call your pharmacy.  Labwork: BMET IN 1 WEEK HERE IN OUR OFFICE AT [facility]  Follow-Up: Your physician wants you to follow-up in: 2-3 WEEKS WITH HEART FAILURE CLINIC.   Thank you for choosing [facility]!!

## 2017-10-04 NOTE — Progress Notes (Signed)
10/04/2017 Deborah Ho   May 25, 1951  [id number]  Primary Physician Grayce Sessions, NP Primary Cardiologist: Previously Dr Shirlee Latch and the CHF clinic-                Now-?   HPI:  Pt is a 66 y.o.AA female with a hx of ischemic cardiomyopathy, chronic systolic and diastolic heart failure, HTN, HLD, tobacco use, CAD s/p anterior STEMI (2015) with early stent thrombosis 2/ missing doses of brilinta s/p PCI x 2 to LAD (04/2014), medical noncompliance, GERD, and CKD stage III. She was admitted 09/22/17-09/29/17 with acute on chronic CHF. She was 30 lbs over her dry wgt- estimated at 205. She was diuresed and is seeing me in the office today for follow up. The pt had been seen by the heart failure service in the past but doesn't want to go back there "I don't like they're answers". On reviewing her medications it became clear that she has not been talking Aldactone. I suggested she resume this. We had a 25 minute discussion about this. In the end she agreed to start Aldactone and stop K+ supplement.      Current Outpatient Medications  Medication Sig Dispense Refill  . atorvastatin (LIPITOR) 80 MG tablet Take 1 tablet (80 mg total) by mouth daily at 6 PM. 30 tablet 0  . carvedilol (COREG) 3.125 MG tablet Take 3.125 mg every other day by mouth.     . cetirizine (ZYRTEC) 10 MG tablet Take 10 mg daily by mouth.     . cholecalciferol (VITAMIN D) 1000 units tablet Take 1,000 Units by mouth. Twice a month    . ferrous sulfate 325 (65 FE) MG tablet Take 325 mg 3 (three) times daily by mouth. Monday,Wednesday, friday    . furosemide (LASIX) 40 MG tablet Take 1.5 tablets (60 mg total) 2 (two) times daily by mouth. 90 tablet 0  . magnesium oxide (MAG-OX) 400 (241.3 Mg) MG tablet Take 400 mg by mouth. Twice a month    . nitroGLYCERIN (NITROSTAT) 0.4 MG SL tablet Place 0.4 mg under the tongue every 5 (five) minutes as needed for chest pain.    . potassium chloride SA (K-DUR,KLOR-CON) 20 MEQ tablet Take  1 tablet (20 mEq total) by mouth daily. 30 tablet 3  . spironolactone (ALDACTONE) 25 MG tablet Take 1 tablet (25 mg total) daily by mouth. 30 tablet 3  . ticagrelor (BRILINTA) 60 MG TABS tablet Take 1 tablet (60 mg total) by mouth 2 (two) times daily. (Patient taking differently: Take 60 mg daily by mouth. ) 60 tablet 0   No current facility-administered medications for this visit.     Allergies  Allergen Reactions  . Aspirin Swelling    Chewable children's aspirin makes patients tongue and face swell  . Effient [Prasugrel] Swelling    Patient's tongue and face swells  . Entresto [Sacubitril-Valsartan] Other (See Comments)    dizziness  . Lactose Intolerance (Gi) Other (See Comments)    REACTION: stomach upset  . Robitussin Dm [Guaifenesin-Dm] Swelling    Patient's tongue swells  . Sulfa Antibiotics Swelling  . Other     Had to replace "catgut" with clamps  . Wheat Bran Other (See Comments)    REACTION: unknown    Past Medical History:  Diagnosis Date  . CAD (coronary artery disease) 05/03/14; 05/09/14   a. anterior STEMI with early in-stent thorombosis for missed dose of Brillinta s/p PCI with DESx 2 into LAD (04/2014)  . CHF (congestive  heart failure) (HCC)   . GERD (gastroesophageal reflux disease)    Probable  . HTN (hypertension)   . Hyperlipidemia   . Ischemic cardiomyopathy    a. 04/2014 ECHO with EF 45-50% b.  Repeat 2D echo 08/14/14 with EF down at 15%. Life vest placed  . Non compliance w medication regimen   . Tobacco use     Social History   Socioeconomic History  . Marital status: Widowed    Spouse name: Not on file  . Number of children: Not on file  . Years of education: Not on file  . Highest education level: Not on file  Social Needs  . Financial resource strain: Not on file  . Food insecurity - worry: Not on file  . Food insecurity - inability: Not on file  . Transportation needs - medical: Not on file  . Transportation needs - non-medical: Not on  file  Occupational History  . Occupation: not employed  Tobacco Use  . Smoking status: Current Some Day Smoker    Packs/day: 0.10    Years: 0.50    Pack years: 0.05    Types: Cigarettes  . Smokeless tobacco: Current User  . Tobacco comment: down to 3 cigarettes daily (08/03/14)  Substance and Sexual Activity  . Alcohol use: No  . Drug use: No  . Sexual activity: Not on file  Other Topics Concern  . Not on file  Social History Narrative   Patient has 6 brothers and sisters and none have known coronary artery disease. She lives alone in [place], but has several brothers in the area.     Family History  Problem Relation Age of Onset  . Diabetes Mother   . Hypertension Mother      Review of Systems: General: negative for chills, fever, night sweats or weight changes.  Cardiovascular: negative for chest pain, dyspnea on exertion, edema, orthopnea, palpitations, paroxysmal nocturnal dyspnea or shortness of breath Dermatological: negative for rash Respiratory: negative for cough or wheezing Urologic: negative for hematuria Abdominal: negative for nausea, vomiting, diarrhea, bright red blood per rectum, melena, or hematemesis Neurologic: negative for visual changes, syncope, or dizziness All other systems reviewed and are otherwise negative except as noted above.    Blood pressure (!) 141/88, pulse 93, height 5\' 5"  (1.651 m), weight 203 lb (92.1 kg).  General appearance: alert, cooperative, no distress and moderately obese Lungs: clear to auscultation bilaterally Heart: regular rate and rhythm Extremities: no edema Skin: Skin color, texture, turgor normal. No rashes or lesions Neurologic: Grossly normal   ASSESSMENT AND PLAN:   Acute on chronic combined systolic and diastolic CHF (congestive heart failure) (HCC) F/u from recent admission-current wgt stable We assumed she was on Aldactone but in reality she was never taking this and is not taking it now.    Cardiomyopathy, ischemic EF 20-25% by echo Nov 2018  CKD (chronic kidney disease), stage III (HCC) DC SCr 1.5  Essential hypertension Controlled  History of ST elevation myocardial infarction (STEMI) Anterior wall STEMI June 2015 treated with PCI/DES. Acute MI secondary to ISR (non compliance) two weeks later.  Noncompliance with medication regimen Ongoing problem- she has decided she doesn't want f/u with the heart failure clinic.   PLAN  Start Aldactone 25 mg, stop K+ Dur 20 Meq. BMP one week. She will consider f/u arrangements.   July 2015 PA-C 10/04/2017 11:59 AM

## 2017-10-04 NOTE — Assessment & Plan Note (Signed)
DC SCr 1.5

## 2017-10-04 NOTE — Assessment & Plan Note (Signed)
Anterior wall STEMI June 2015 treated with PCI/DES. Acute MI secondary to ISR (non compliance) two weeks later.

## 2017-10-04 NOTE — Assessment & Plan Note (Signed)
EF 20-25% by echo Nov 2018

## 2017-10-14 LAB — BASIC METABOLIC PANEL
BUN/Creatinine Ratio: 18 (ref 12–28)
BUN: 21 mg/dL (ref 8–27)
CO2: 25 mmol/L (ref 20–29)
Calcium: 9.9 mg/dL (ref 8.7–10.3)
Chloride: 103 mmol/L (ref 96–106)
Creatinine, Ser: 1.18 mg/dL — ABNORMAL HIGH (ref 0.57–1.00)
GFR calc Af Amer: 56 mL/min/{1.73_m2} — ABNORMAL LOW (ref >59)
GFR calc non Af Amer: 48 mL/min/{1.73_m2} — ABNORMAL LOW (ref >59)
Glucose: 145 mg/dL — ABNORMAL HIGH (ref 65–99)
Potassium: 4.2 mmol/L (ref 3.5–5.2)
Sodium: 145 mmol/L — ABNORMAL HIGH (ref 134–144)

## 2017-10-19 ENCOUNTER — Ambulatory Visit (HOSPITAL_COMMUNITY)
Admission: RE | Admit: 2017-10-19 | Discharge: 2017-10-19 | Disposition: A | Discharge status: Home / Self Care | Payer: Medicare Other | Source: Ambulatory Visit | Attending: Adult Health | Admitting: Adult Health

## 2017-10-19 ENCOUNTER — Encounter (HOSPITAL_COMMUNITY): Payer: Self-pay

## 2017-10-19 VITALS — BP 153/89 | HR 86 | Wt 213.0 lb

## 2017-10-19 DIAGNOSIS — Z8249 Family history of ischemic heart disease and other diseases of the circulatory system: Secondary | ICD-10-CM | POA: Diagnosis not present

## 2017-10-19 DIAGNOSIS — I5022 Chronic systolic (congestive) heart failure: Secondary | ICD-10-CM | POA: Diagnosis not present

## 2017-10-19 DIAGNOSIS — K219 Gastro-esophageal reflux disease without esophagitis: Secondary | ICD-10-CM | POA: Insufficient documentation

## 2017-10-19 DIAGNOSIS — I2510 Atherosclerotic heart disease of native coronary artery without angina pectoris: Secondary | ICD-10-CM | POA: Insufficient documentation

## 2017-10-19 DIAGNOSIS — Z955 Presence of coronary angioplasty implant and graft: Secondary | ICD-10-CM | POA: Insufficient documentation

## 2017-10-19 DIAGNOSIS — Z79899 Other long term (current) drug therapy: Secondary | ICD-10-CM | POA: Insufficient documentation

## 2017-10-19 DIAGNOSIS — I5043 Acute on chronic combined systolic (congestive) and diastolic (congestive) heart failure: Secondary | ICD-10-CM

## 2017-10-19 DIAGNOSIS — N183 Chronic kidney disease, stage 3 unspecified: Secondary | ICD-10-CM

## 2017-10-19 DIAGNOSIS — I130 Hypertensive heart and chronic kidney disease with heart failure and stage 1 through stage 4 chronic kidney disease, or unspecified chronic kidney disease: Secondary | ICD-10-CM | POA: Insufficient documentation

## 2017-10-19 DIAGNOSIS — E785 Hyperlipidemia, unspecified: Secondary | ICD-10-CM | POA: Insufficient documentation

## 2017-10-19 DIAGNOSIS — I252 Old myocardial infarction: Secondary | ICD-10-CM | POA: Insufficient documentation

## 2017-10-19 DIAGNOSIS — Z9114 Patient's other noncompliance with medication regimen: Secondary | ICD-10-CM | POA: Diagnosis not present

## 2017-10-19 DIAGNOSIS — I255 Ischemic cardiomyopathy: Secondary | ICD-10-CM | POA: Insufficient documentation

## 2017-10-19 DIAGNOSIS — Z833 Family history of diabetes mellitus: Secondary | ICD-10-CM | POA: Diagnosis not present

## 2017-10-19 DIAGNOSIS — F17210 Nicotine dependence, cigarettes, uncomplicated: Secondary | ICD-10-CM | POA: Diagnosis not present

## 2017-10-19 DIAGNOSIS — I10 Essential (primary) hypertension: Secondary | ICD-10-CM | POA: Diagnosis not present

## 2017-10-19 LAB — BASIC METABOLIC PANEL
Anion gap: 10 (ref 5–15)
BUN: 16 mg/dL (ref 6–20)
CO2: 26 mmol/L (ref 22–32)
Calcium: 9 mg/dL (ref 8.9–10.3)
Chloride: 105 mmol/L (ref 101–111)
Creatinine, Ser: 1.13 mg/dL — ABNORMAL HIGH (ref 0.44–1.00)
GFR calc Af Amer: 57 mL/min — ABNORMAL LOW (ref >60)
GFR calc non Af Amer: 50 mL/min — ABNORMAL LOW (ref >60)
Glucose, Bld: 126 mg/dL — ABNORMAL HIGH (ref 65–99)
Potassium: 3.4 mmol/L — ABNORMAL LOW (ref 3.5–5.1)
Sodium: 141 mmol/L (ref 135–145)

## 2017-10-19 LAB — BRAIN NATRIURETIC PEPTIDE: B Natriuretic Peptide: 1172.7 pg/mL — ABNORMAL HIGH (ref 0.0–100.0)

## 2017-10-19 MED ORDER — POTASSIUM CHLORIDE CRYS ER 20 MEQ PO TBCR
40.0000 meq | EXTENDED_RELEASE_TABLET | Freq: Once | ORAL | Status: AC
  Administered 2017-10-19: 40 meq via ORAL
  Filled 2017-10-19: qty 2

## 2017-10-19 MED ORDER — FUROSEMIDE 10 MG/ML IJ SOLN
80.0000 mg | Freq: Once | INTRAMUSCULAR | Status: AC
  Administered 2017-10-19: 80 mg via INTRAVENOUS
  Filled 2017-10-19: qty 8

## 2017-10-19 MED ORDER — POTASSIUM CHLORIDE CRYS ER 20 MEQ PO TBCR: 40.0000 meq | EXTENDED_RELEASE_TABLET | Freq: Every day | ORAL | 6 refills | Status: DC

## 2017-10-19 MED ORDER — TORSEMIDE 20 MG PO TABS: 60.0000 mg | ORAL_TABLET | Freq: Every day | ORAL | 6 refills | Status: DC

## 2017-10-19 NOTE — Patient Instructions (Signed)
STOP Lasix.  START Torsemide 60 mg (3 tabs) twice daily.  INCREASE Potassium to 40 meq (2 tabs) once daily.  Return once weekly for the next 4 week.  _________________________________________________________ [place] Code: 8002  _________________________________________________________ [place] Code: 8002  _________________________________________________________ [place] Code: 8002  _________________________________________________________ [place] Code: 9000  Take all medication as prescribed the day of your appointment. Bring all medications with you to your appointment.  Do the following things EVERYDAY: 1) Weigh yourself in the morning before breakfast. Write it down and keep it in a log. 2) Take your medicines as prescribed 3) Eat low salt foods-Limit salt (sodium) to 2000 mg per day.  4) Stay as active as you can everyday 5) Limit all fluids for the day to less than 2 liters

## 2017-10-19 NOTE — Progress Notes (Signed)
Patient ID: Deborah Ho, female   DOB: 1951/05/23, 66 y.o.   MRN: [id number]    Advanced Heart Failure Clinic Note   PCP: [name] Primary HF MD: Dr Shirlee Latch   HPI: Deborah Ho is a 66 y.o. female with a history of HTN, HLD, tobacco abuse, CAD s/panterior STEMI with early stent thrombosis for missing doses of Brilinta s/p PCI x 2 into LAD (04/2014) and chronic systolic HF with EF 20% by 3/16 echo.   Admitted to [facility] in 3/16 with CHF and R pleural effusion. Effusion tapped (transudative). Echo with EF down to 20%. Diuresed with IV lasix and transitioned torsemide + metolazone as needed for weight >187 pounds. Creatinine was 1.7 on the day discharge. Discharge weight 183 pounds.   Admitted 09/22/2017 with a/c systolic heart failure. Diuresed with IV lasix the transitioned to lasix 60 mg twice a day. Discharge weight 206 pounds.    Today she returns for HF follow up. Overall feeling fine but cant why her weight goes up.  Denies SOB/PND/Orthopnea. No chest pain. Over night she says her abdomen is getting bigger. Appetite ok. Yesterday she had a hot dog but says she tries to follow low salt diet.  Drinking lots of water. No fever or chills. Weight at home has been going up from 200-207 pounds. Taking all medications. Lives alone.      9/16: Cardiac MRI with EF 19%, LAD territory scar (nonviable), no thrombus.  07/25/2015: ECHO EF 25% RV normal 01/30/2015 ECHO EF 20%  11/13/2014: ECHO EF 35-40% RV normal.  02/21/16 ECHO EF 15%. Mod reduced RV function, severe TR 09/2017 ECHO EF 20-25%. RV dilated   SH:  Social History   Socioeconomic History  . Marital status: Widowed    Spouse name: Not on file  . Number of children: Not on file  . Years of education: Not on file  . Highest education level: Not on file  Social Needs  . Financial resource strain: Not on file  . Food insecurity - worry: Not on file  . Food insecurity - inability: Not on file  . Transportation needs - medical:  Not on file  . Transportation needs - non-medical: Not on file  Occupational History  . Occupation: not employed  Tobacco Use  . Smoking status: Current Some Day Smoker    Packs/day: 0.10    Years: 0.50    Pack years: 0.05    Types: Cigarettes  . Smokeless tobacco: Current User  . Tobacco comment: down to 3 cigarettes daily (08/03/14)  Substance and Sexual Activity  . Alcohol use: No  . Drug use: No  . Sexual activity: Not on file  Other Topics Concern  . Not on file  Social History Narrative   Patient has 6 brothers and sisters and none have known coronary artery disease. She lives alone in [place], but has several brothers in the area.    FH:  Family History  Problem Relation Age of Onset  . Diabetes Mother   . Hypertension Mother     Past Medical History:  Diagnosis Date  . CAD (coronary artery disease) 05/03/14; 05/09/14   a. anterior STEMI with early in-stent thorombosis for missed dose of Brillinta s/p PCI with DESx 2 into LAD (04/2014)  . CHF (congestive heart failure) (HCC)   . GERD (gastroesophageal reflux disease)    Probable  . HTN (hypertension)   . Hyperlipidemia   . Ischemic cardiomyopathy    a. 04/2014 ECHO with EF 45-50% b.  Repeat 2D echo 08/14/14 with EF down at 15%. Life vest placed  . Non compliance w medication regimen   . Tobacco use     Current Outpatient Medications  Medication Sig Dispense Refill  . atorvastatin (LIPITOR) 80 MG tablet Take 1 tablet (80 mg total) by mouth daily at 6 PM. 30 tablet 0  . carvedilol (COREG) 3.125 MG tablet Take 3.125 mg every other day by mouth.     . cetirizine (ZYRTEC) 10 MG tablet Take 10 mg daily by mouth.     . cholecalciferol (VITAMIN D) 1000 units tablet Take 1,000 Units by mouth. Twice a month    . ferrous sulfate 325 (65 FE) MG tablet Take 325 mg 3 (three) times daily by mouth. Monday,Wednesday, friday    . furosemide (LASIX) 40 MG tablet Take 1.5 tablets (60 mg total) 2 (two) times daily by mouth. 90  tablet 0  . magnesium oxide (MAG-OX) 400 (241.3 Mg) MG tablet Take 400 mg by mouth. Twice a month    . ticagrelor (BRILINTA) 60 MG TABS tablet Take 1 tablet (60 mg total) by mouth 2 (two) times daily. (Patient taking differently: Take 60 mg daily by mouth. ) 60 tablet 0  . nitroGLYCERIN (NITROSTAT) 0.4 MG SL tablet Place 0.4 mg under the tongue every 5 (five) minutes as needed for chest pain.    . potassium chloride SA (K-DUR,KLOR-CON) 20 MEQ tablet Take 1 tablet (20 mEq total) by mouth daily. 30 tablet 3  . spironolactone (ALDACTONE) 25 MG tablet Take 1 tablet (25 mg total) daily by mouth. (Patient not taking: Reported on 10/19/2017) 30 tablet 3   No current facility-administered medications for this encounter.     There were no vitals filed for this visit. Wt Readings from Last 3 Encounters:  10/19/17 213 lb (96.6 kg)  10/04/17 203 lb (92.1 kg)  09/29/17 206 lb 11.2 oz (93.8 kg)    PHYSICAL EXAM: General:  Appears fatigued. No resp difficulty HEENT: normal Neck: supple. JVP to ear. Carotids 2+ bilat; no bruits. No lymphadenopathy or thryomegaly appreciated. Cor: PMI nondisplaced. Regular rate & rhythm. No rubs, gallops or murmurs. Lungs: clear Abdomen: soft, nontender, +++ distended. No hepatosplenomegaly. No bruits or masses. Good bowel sounds. Extremities: no cyanosis, clubbing, rash, R and LLE 2+ edema Neuro: alert & orientedx3, cranial nerves grossly intact. moves all 4 extremities w/o difficulty. Affect pleasant   ASSESSMENT & PLAN: 1.  Chronic systolic CHF:  Ischemic cardiomyopathy.  ECHO 09/2017 EF 20-25%.   - Marked volume overload likely in the setting of diet/fluid.  -Give 80 mg IV lasix in the clinic + 20 meq potassium now. > 500 cc urine output note. IV removed.  -Stop lasix. Start torsemide 60 mg twice a day.  - Continue coreg 3.125 mg twice a day.  - Not on losartan with recent elevated renal function.  -Ideally she would take bidil but she will not take three times  a deay.   2. CAD: s/p anterior MI,  PCI x2 LAD 04/2014.  History of stent thrombosis. - Continue Brilinta 80 mg, atorvastatin 80 mg daily, and Coreg as above.  -No s/s ischemiai.  3. HTN-  - Elevated. Diuresing today.  - Re-fuses to rechallenge Entresto.  4. CKD stage III Creatinine 1.2-1.4.  BMET today.   She is very difficult to manage given poor insight and ongoing medication issues.   Follow up tomorrow. May need to give IV lasix again. Ideally she would allow HF Paramedicine to follow at  home but she is agreeable.   Greater than 50% of the (total minutes 50) visit spent in counseling/coordination of care regarding volume overload, low salt diet, limiting fluid intake < 2 liters per day, lab work, IV lasix, and the importance of compliance .  Tonye Becket, NP-C  10/19/2017

## 2017-10-19 NOTE — Progress Notes (Signed)
Deborah Ho seen in HF Clinic.  Patient is well known to me from previous hospitalizations and clinic visits.  I have reviewed HF recommendations at length several times with Deborah Ho.  She says that she is unsure as to why she "keeps gaining fluid".  She says that she takes all prescribed medications and watches her food intake and "sodium".  We discussed the possibility of re-enrolling in the HF [employer].  She has been enrolled in the past and has been difficult to schedule visits with.  She has voiced that she does not find value in the visits.  Deborah Ho and myself strongly encouraged that she re-enroll in the Paramedicine Program due to the difficulty she has at home and her risk for readmission.  At this time she is in agreement with having HF Community Paramedics visit for a "few weeks" and she plans to follow-up in the AHF Clinic.  We will continue to follow her and monitor her through clinic visits as well as [employer] visits.

## 2017-10-21 ENCOUNTER — Encounter (HOSPITAL_COMMUNITY): Payer: Self-pay

## 2017-10-21 ENCOUNTER — Ambulatory Visit (HOSPITAL_COMMUNITY)
Admission: RE | Admit: 2017-10-21 | Discharge: 2017-10-21 | Disposition: A | Discharge status: Home / Self Care | Payer: Medicare Other | Source: Ambulatory Visit | Attending: Internal Medicine | Admitting: Internal Medicine

## 2017-10-21 VITALS — BP 138/92 | HR 88 | Wt 212.0 lb

## 2017-10-21 DIAGNOSIS — Z955 Presence of coronary angioplasty implant and graft: Secondary | ICD-10-CM | POA: Diagnosis not present

## 2017-10-21 DIAGNOSIS — I255 Ischemic cardiomyopathy: Secondary | ICD-10-CM | POA: Diagnosis not present

## 2017-10-21 DIAGNOSIS — E785 Hyperlipidemia, unspecified: Secondary | ICD-10-CM | POA: Insufficient documentation

## 2017-10-21 DIAGNOSIS — E876 Hypokalemia: Secondary | ICD-10-CM | POA: Diagnosis not present

## 2017-10-21 DIAGNOSIS — K219 Gastro-esophageal reflux disease without esophagitis: Secondary | ICD-10-CM | POA: Insufficient documentation

## 2017-10-21 DIAGNOSIS — I2510 Atherosclerotic heart disease of native coronary artery without angina pectoris: Secondary | ICD-10-CM | POA: Diagnosis present

## 2017-10-21 DIAGNOSIS — N183 Chronic kidney disease, stage 3 unspecified: Secondary | ICD-10-CM

## 2017-10-21 DIAGNOSIS — I130 Hypertensive heart and chronic kidney disease with heart failure and stage 1 through stage 4 chronic kidney disease, or unspecified chronic kidney disease: Secondary | ICD-10-CM | POA: Insufficient documentation

## 2017-10-21 DIAGNOSIS — F17210 Nicotine dependence, cigarettes, uncomplicated: Secondary | ICD-10-CM | POA: Diagnosis not present

## 2017-10-21 DIAGNOSIS — Z9114 Patient's other noncompliance with medication regimen: Secondary | ICD-10-CM | POA: Diagnosis not present

## 2017-10-21 DIAGNOSIS — I5023 Acute on chronic systolic (congestive) heart failure: Secondary | ICD-10-CM | POA: Diagnosis not present

## 2017-10-21 DIAGNOSIS — I5043 Acute on chronic combined systolic (congestive) and diastolic (congestive) heart failure: Secondary | ICD-10-CM | POA: Diagnosis not present

## 2017-10-21 DIAGNOSIS — I252 Old myocardial infarction: Secondary | ICD-10-CM | POA: Insufficient documentation

## 2017-10-21 MED ORDER — POTASSIUM CHLORIDE CRYS ER 20 MEQ PO TBCR: 40.0000 meq | EXTENDED_RELEASE_TABLET | Freq: Every day | ORAL | 6 refills | Status: DC

## 2017-10-21 MED ORDER — SPIRONOLACTONE 25 MG PO TABS: 25.0000 mg | ORAL_TABLET | Freq: Every day | ORAL | 3 refills | Status: DC

## 2017-10-21 NOTE — Progress Notes (Signed)
Patient ID: [place], female   DOB: [birth year], 66 y.o.   MRN: [id number]    Advanced Heart Failure Clinic Note   PCP: [place] Primary HF MD: [place]   HPI: [place] is a 66 y.o. female with a history of HTN, HLD, tobacco abuse, CAD s/panterior STEMI with early stent thrombosis for missing doses of Brilinta s/p PCI x 2 into LAD (04/2014) and chronic systolic HF with EF 20% by 3/16 echo.   Admitted to [facility] in 3/16 with CHF and R pleural effusion. Effusion tapped (transudative). Echo with EF down to 20%. Diuresed with IV lasix and transitioned torsemide + metolazone as needed for weight >187 pounds. Creatinine was 1.7 on the day discharge. Discharge weight 183 pounds.   Admitted 09/22/2017 with a/c systolic heart failure. Diuresed with IV lasix the transitioned to lasix 60 mg twice a day. Discharge weight 206 pounds.    Today she returns for HF follow up. Earlier this week she was given 80 mg IV lasix in the clinic. She was instructed to stop lasix and start torsemide. She continued on lasix and did not start torsemide. Denies SOB/PND/orthopnea. She did not take medications today. She is out of potassium. Lives alone.    9/16: Cardiac MRI with EF 19%, LAD territory scar (nonviable), no thrombus.  07/25/2015: ECHO EF 25% RV normal 01/30/2015 ECHO EF 20%  11/13/2014: ECHO EF 35-40% RV normal.  02/21/16 ECHO EF 15%. Mod reduced RV function, severe TR 09/2017 ECHO EF 20-25%. RV dilated   SH:  Social History   Socioeconomic History  . Marital status: Widowed    Spouse name: Not on file  . Number of children: Not on file  . Years of education: Not on file  . Highest education level: Not on file  Social Needs  . Financial resource strain: Not on file  . Food insecurity - worry: Not on file  . Food insecurity - inability: Not on file  . Transportation needs - medical: Not on file  . Transportation needs - non-medical: Not on file  Occupational History  . Occupation:  not employed  Tobacco Use  . Smoking status: Current Some Day Smoker    Packs/day: 0.10    Years: 0.50    Pack years: 0.05    Types: Cigarettes  . Smokeless tobacco: Current User  . Tobacco comment: down to 3 cigarettes daily (08/03/14)  Substance and Sexual Activity  . Alcohol use: No  . Drug use: No  . Sexual activity: Not on file  Other Topics Concern  . Not on file  Social History Narrative   Patient has 6 brothers and sisters and none have known coronary artery disease. She lives alone in [place], but has several brothers in the area.    FH:  Family History  Problem Relation Age of Onset  . Diabetes Mother   . Hypertension Mother     Past Medical History:  Diagnosis Date  . CAD (coronary artery disease) 05/03/14; 05/09/14   a. anterior STEMI with early in-stent thorombosis for missed dose of Brillinta s/p PCI with DESx 2 into LAD (04/2014)  . CHF (congestive heart failure) (HCC)   . GERD (gastroesophageal reflux disease)    Probable  . HTN (hypertension)   . Hyperlipidemia   . Ischemic cardiomyopathy    a. 04/2014 ECHO with EF 45-50% b.  Repeat 2D echo 08/14/14 with EF down at 15%. Life vest placed  . Non compliance w medication regimen   .  Tobacco use     Current Outpatient Medications  Medication Sig Dispense Refill  . atorvastatin (LIPITOR) 80 MG tablet Take 1 tablet (80 mg total) by mouth daily at 6 PM. 30 tablet 0  . carvedilol (COREG) 3.125 MG tablet Take 3.125 mg every other day by mouth.     . cetirizine (ZYRTEC) 10 MG tablet Take 10 mg daily by mouth.     . cholecalciferol (VITAMIN D) 1000 units tablet Take 1,000 Units by mouth. Twice a month    . ferrous sulfate 325 (65 FE) MG tablet Take 325 mg 3 (three) times daily by mouth. Monday,Wednesday, friday    . magnesium oxide (MAG-OX) 400 (241.3 Mg) MG tablet Take 400 mg by mouth. Twice a month    . nitroGLYCERIN (NITROSTAT) 0.4 MG SL tablet Place 0.4 mg under the tongue every 5 (five) minutes as needed for  chest pain.    . potassium chloride SA (K-DUR,KLOR-CON) 20 MEQ tablet Take 2 tablets (40 mEq total) by mouth daily. 60 tablet 6  . ticagrelor (BRILINTA) 60 MG TABS tablet Take 1 tablet (60 mg total) by mouth 2 (two) times daily. (Patient taking differently: Take 60 mg daily by mouth. ) 60 tablet 0  . torsemide (DEMADEX) 20 MG tablet Take 3 tablets (60 mg total) by mouth daily. 180 tablet 6  . spironolactone (ALDACTONE) 25 MG tablet Take 1 tablet (25 mg total) daily by mouth. (Patient not taking: Reported on 10/19/2017) 30 tablet 3   No current facility-administered medications for this encounter.     Vitals:   10/21/17 1329  BP: (!) 138/92  Pulse: 88  SpO2: 96%  Weight: 212 lb (96.2 kg)   Wt Readings from Last 3 Encounters:  10/21/17 212 lb (96.2 kg)  10/19/17 213 lb (96.6 kg)  10/04/17 203 lb (92.1 kg)    PHYSICAL EXAM: General:  Well appearing. No resp difficulty. Walked slowly in the clinic.  HEENT: normal Neck: supple. JVP to jaw. Carotids 2+ bilat; no bruits. No lymphadenopathy or thryomegaly appreciated. Cor: PMI nondisplaced. Regular rate & rhythm. No rubs, gallops or murmurs. Lungs: clear Abdomen: soft, nontender, +++ distended. No hepatosplenomegaly. No bruits or masses. Good bowel sounds. Extremities: no cyanosis, clubbing, rash, R and LLE 2+ edema Neuro: alert & orientedx3, cranial nerves grossly intact. moves all 4 extremities w/o difficulty. Affect pleasant    ASSESSMENT & PLAN: 1.  Acute/Chronic systolic CHF:  Ischemic cardiomyopathy.  ECHO 09/2017 EF 20-25%.   Hard to know how she is doing due to medication noncompliance.  Marked volume overload but not surprising as she has not made any medication changes and she has not taken any medications today.  Today we went over again the AVS from 12/4.  Today I will again stop lasix and she will start torsemide 60 mg three times a day.   - Continue coreg 3.125 mg twice a day.  - Not on losartan with recent elevated  renal function.  -Ideally she would take bidil but she will not take three times a day.   2. CAD: s/p anterior MI,  PCI x2 LAD 04/2014.  History of stent thrombosis. - Continue Brilinta 80 mg, atorvastatin 80 mg daily, and Coreg as above.  -No S/S ischemia  3. HTN-  - Elevated but she has not had medications.   - Re-fuses to rechallenge Entresto.  4. CKD stage III Creatinine 1.2-1.4.  I reviewed BMET from 12/4  5. Hypokalemia K low. Needs to restart potassium and spironolactone.  She remains difficult to manage due to poor insight and medication noncompliance.  Referred to paramedicine today.   Follow up next week to reassess volume. Repeat BMET at that time.   Tonye Becket, NP-C  10/21/2017

## 2017-10-21 NOTE — Patient Instructions (Addendum)
STOP taking Lasix  START taking torsemide 60 mg (3 Tablets) Once Daily  Follow up as scheduled.

## 2017-10-28 ENCOUNTER — Encounter (HOSPITAL_COMMUNITY): Payer: Self-pay

## 2017-11-01 ENCOUNTER — Ambulatory Visit: Payer: Medicare Other | Admitting: Family

## 2017-11-01 ENCOUNTER — Encounter (HOSPITAL_COMMUNITY): Payer: Self-pay

## 2017-11-03 ENCOUNTER — Telehealth (HOSPITAL_COMMUNITY): Payer: Self-pay

## 2017-11-04 ENCOUNTER — Other Ambulatory Visit (HOSPITAL_COMMUNITY): Payer: Self-pay

## 2017-11-04 ENCOUNTER — Encounter (HOSPITAL_COMMUNITY): Payer: Self-pay

## 2017-11-04 ENCOUNTER — Ambulatory Visit (HOSPITAL_COMMUNITY)
Admission: RE | Admit: 2017-11-04 | Discharge: 2017-11-04 | Disposition: A | Discharge status: Home / Self Care | Payer: Medicare Other | Source: Ambulatory Visit | Attending: Cardiology | Admitting: Cardiology

## 2017-11-04 VITALS — BP 158/94 | HR 92 | Wt 225.0 lb

## 2017-11-04 DIAGNOSIS — I5043 Acute on chronic combined systolic (congestive) and diastolic (congestive) heart failure: Secondary | ICD-10-CM

## 2017-11-04 DIAGNOSIS — I130 Hypertensive heart and chronic kidney disease with heart failure and stage 1 through stage 4 chronic kidney disease, or unspecified chronic kidney disease: Secondary | ICD-10-CM | POA: Diagnosis present

## 2017-11-04 DIAGNOSIS — F17210 Nicotine dependence, cigarettes, uncomplicated: Secondary | ICD-10-CM | POA: Insufficient documentation

## 2017-11-04 DIAGNOSIS — I2510 Atherosclerotic heart disease of native coronary artery without angina pectoris: Secondary | ICD-10-CM | POA: Insufficient documentation

## 2017-11-04 DIAGNOSIS — Z86718 Personal history of other venous thrombosis and embolism: Secondary | ICD-10-CM | POA: Insufficient documentation

## 2017-11-04 DIAGNOSIS — Z955 Presence of coronary angioplasty implant and graft: Secondary | ICD-10-CM | POA: Diagnosis not present

## 2017-11-04 DIAGNOSIS — K219 Gastro-esophageal reflux disease without esophagitis: Secondary | ICD-10-CM | POA: Diagnosis not present

## 2017-11-04 DIAGNOSIS — Z79899 Other long term (current) drug therapy: Secondary | ICD-10-CM | POA: Insufficient documentation

## 2017-11-04 DIAGNOSIS — Z9114 Patient's other noncompliance with medication regimen: Secondary | ICD-10-CM | POA: Diagnosis not present

## 2017-11-04 DIAGNOSIS — N183 Chronic kidney disease, stage 3 unspecified: Secondary | ICD-10-CM

## 2017-11-04 DIAGNOSIS — I252 Old myocardial infarction: Secondary | ICD-10-CM | POA: Diagnosis not present

## 2017-11-04 DIAGNOSIS — I5023 Acute on chronic systolic (congestive) heart failure: Secondary | ICD-10-CM | POA: Insufficient documentation

## 2017-11-04 DIAGNOSIS — E785 Hyperlipidemia, unspecified: Secondary | ICD-10-CM | POA: Insufficient documentation

## 2017-11-04 DIAGNOSIS — I255 Ischemic cardiomyopathy: Secondary | ICD-10-CM | POA: Insufficient documentation

## 2017-11-04 MED ORDER — TORSEMIDE 20 MG PO TABS: 60.0000 mg | ORAL_TABLET | Freq: Two times a day (BID) | ORAL | 6 refills | Status: DC

## 2017-11-04 NOTE — Progress Notes (Signed)
Paramedicine Encounter   Patient ID: Araminta Zorn , female,   DOB: 1951/07/19,66 y.o.,  MRN: [id number]   Met patient in clinic today with provider.  Time spent with patient 60 min   Pt states she isnt able for me to make a home visit until January--she seems very reluctant for me to come out.  weight 225 @ clinic- weight @ home-220 not feeling well-skipped her carvedilol and hasnt taken it since tuesday-states she  feels like it makes her sick with nausea.  weight at home-211-215 ranges  seems quite reluctant for me to come out to visit she forgot to take potassium today denies sob-she states that her legs and abd are distended and swollen.  she is able to walk around store without issues, drives herself pt states she is drinking less than 2L, feels like she is urinating less.  pt denies dizziness, no pains, no h/a. Torsemide is going to be increased to 3 tabs BID.  Hope to go out to see her next week at home however she said she isnt sure if she will be home-so I will call her to see and asked her to answer my call or call me back to let me know.  Sounds like her diet is full of sodium filled foods so she said that will be improved.   Marylouise Stacks, EMT-Paramedic 11/04/2017   ACTION: Home visit completed

## 2017-11-04 NOTE — Progress Notes (Signed)
Patient ID: [place], female   DOB: 02-02-1951, 66 y.o.   MRN: [id number]    Advanced Heart Failure Clinic Note   PCP: [name] Primary HF MD: Dr Shirlee Latch   HPI: [place] is a 66 y.o. female with a history of HTN, HLD, tobacco abuse, CAD s/panterior STEMI with early stent thrombosis for missing doses of Brilinta s/p PCI x 2 into LAD (04/2014) and chronic systolic HF with EF 20% by 3/16 echo.   Admitted to [facility] in 3/16 with CHF and R pleural effusion. Effusion tapped (transudative). Echo with EF down to 20%. Diuresed with IV lasix and transitioned torsemide + metolazone as needed for weight >187 pounds. Creatinine was 1.7 on the day discharge. Discharge weight 183 pounds.   Admitted 09/22/2017 with a/c systolic heart failure. Diuresed with IV lasix the transitioned to lasix 60 mg twice a day. Discharge weight 206 pounds.    Today she returns for HF follow up. Stopped taking carvedilol a few days ago. Overall feeling ok. SOB with exertion. Denies PND/orthopnea. Eating chinese food, campbells soup, ham, and bacon. Says she has abdominal bloating. Only taking 60 mg of torsemide instead of 60 mg twice a day. Lives alone.    9/16: Cardiac MRI with EF 19%, LAD territory scar (nonviable), no thrombus.  07/25/2015: ECHO EF 25% RV normal 01/30/2015 ECHO EF 20%  11/13/2014: ECHO EF 35-40% RV normal.  02/21/16 ECHO EF 15%. Mod reduced RV function, severe TR 09/2017 ECHO EF 20-25%. RV dilated   SH:  Social History   Socioeconomic History  . Marital status: Widowed    Spouse name: Not on file  . Number of children: Not on file  . Years of education: Not on file  . Highest education level: Not on file  Social Needs  . Financial resource strain: Not on file  . Food insecurity - worry: Not on file  . Food insecurity - inability: Not on file  . Transportation needs - medical: Not on file  . Transportation needs - non-medical: Not on file  Occupational History  . Occupation: not  employed  Tobacco Use  . Smoking status: Current Some Day Smoker    Packs/day: 0.10    Years: 0.50    Pack years: 0.05    Types: Cigarettes  . Smokeless tobacco: Current User  . Tobacco comment: down to 3 cigarettes daily (08/03/14)  Substance and Sexual Activity  . Alcohol use: No  . Drug use: No  . Sexual activity: Not on file  Other Topics Concern  . Not on file  Social History Narrative   Patient has 6 brothers and sisters and none have known coronary artery disease. She lives alone in [place], but has several brothers in the area.    FH:  Family History  Problem Relation Age of Onset  . Diabetes Mother   . Hypertension Mother     Past Medical History:  Diagnosis Date  . CAD (coronary artery disease) 05/03/14; 05/09/14   a. anterior STEMI with early in-stent thorombosis for missed dose of Brillinta s/p PCI with DESx 2 into LAD (04/2014)  . CHF (congestive heart failure) (HCC)   . GERD (gastroesophageal reflux disease)    Probable  . HTN (hypertension)   . Hyperlipidemia   . Ischemic cardiomyopathy    a. 04/2014 ECHO with EF 45-50% b.  Repeat 2D echo 08/14/14 with EF down at 15%. Life vest placed  . Non compliance w medication regimen   . Tobacco use  Current Outpatient Medications  Medication Sig Dispense Refill  . atorvastatin (LIPITOR) 80 MG tablet Take 40 mg by mouth daily.    . cetirizine (ZYRTEC) 10 MG tablet Take 10 mg daily by mouth.     . cholecalciferol (VITAMIN D) 1000 units tablet Take 1,000 Units by mouth. Twice a month    . ferrous sulfate 325 (65 FE) MG tablet Take 325 mg 3 (three) times daily by mouth. Monday,Wednesday, friday    . magnesium oxide (MAG-OX) 400 (241.3 Mg) MG tablet Take 400 mg by mouth. Twice a month    . nitroGLYCERIN (NITROSTAT) 0.4 MG SL tablet Place 0.4 mg under the tongue every 5 (five) minutes as needed for chest pain.    . potassium chloride SA (K-DUR,KLOR-CON) 20 MEQ tablet Take 2 tablets (40 mEq total) by mouth daily. 60  tablet 6  . spironolactone (ALDACTONE) 25 MG tablet Take 1 tablet (25 mg total) by mouth daily. 30 tablet 3  . ticagrelor (BRILINTA) 60 MG TABS tablet Take 1 tablet (60 mg total) by mouth 2 (two) times daily. 60 tablet 0  . torsemide (DEMADEX) 20 MG tablet Take 3 tablets (60 mg total) by mouth daily. 180 tablet 6   No current facility-administered medications for this encounter.     Vitals:   11/04/17 1424  BP: (!) 158/94  Pulse: 92  SpO2: 98%  Weight: 225 lb (102.1 kg)   Wt Readings from Last 3 Encounters:  11/04/17 225 lb (102.1 kg)  10/21/17 212 lb (96.2 kg)  10/19/17 213 lb (96.6 kg)   PHYSICAL EXAM: General:  Well appearing. No resp difficulty HEENT: normal Neck: supple. JVP to jaw. Carotids 2+ bilat; no bruits. No lymphadenopathy or thryomegaly appreciated. Cor: PMI nondisplaced. Regular rate & rhythm. No rubs, gallops or murmurs. Lungs: clear Abdomen: soft, nontender, ++distended. No hepatosplenomegaly. No bruits or masses. Good bowel sounds. Extremities: no cyanosis, clubbing, rash, RLE and LLE 2-3+edema Neuro: alert & orientedx3, cranial nerves grossly intact. moves all 4 extremities w/o difficulty. Affect pleasant  ASSESSMENT & PLAN: 1.  Acute/Chronic systolic CHF:  Ischemic cardiomyopathy.  ECHO 09/2017 EF 20-25%.   NYHA IIIb. Volume status elevated. Increase torsemide to 60 mg twice a day. I have encouraged her to take torsemide as ordered.  -Continue spiro 25 mg daily.  - Hold bb for now per patient request.   - Not on losartan with recent elevated renal function.  - Refuses to rechallenge entresto -Ideally she would take bidil but she will not take three times a day.   2. CAD: s/p anterior MI,  PCI x2 LAD 04/2014.  History of stent thrombosis. - Continue Brilinta 80 mg, atorvastatin 80 mg daily, and Coreg as above.  -No S/S ischemia  3. HTN-  - elevated but not surprising due to ongoing medication noncompliance.   - Re-fuses to rechallenge Entresto.  4. CKD  stage III Creatinine 1.2-1.4.  Check BMET now.  Greater than 50% of the (total minutes 25) visit spent in counseling/coordination of care regarding low salt diet, limiting fluid intake, and medication compliance.     Follow up in 2 weeks.   Tonye Becket, NP-C  11/04/2017

## 2017-11-04 NOTE — Telephone Encounter (Signed)
Attempted to reach patient regarding upcoming appointment at clinic and to advise that I would be there also to re-enroll her in paramedicine.

## 2017-11-04 NOTE — Patient Instructions (Addendum)
Take Torsemide 60 mg (3 tabs) twice daily -- 3 tabs in the morning and 3 times in the afternoon.  Follow up as scheduled. See attached sheet for appointment details.  Take all medication as prescribed the day of your appointment. Bring all medications with you to your appointment.  Do the following things EVERYDAY: 1) Weigh yourself in the morning before breakfast. Write it down and keep it in a log. 2) Take your medicines as prescribed 3) Eat low salt foods-Limit salt (sodium) to 2000 mg per day.  4) Stay as active as you can everyday 5) Limit all fluids for the day to less than 2 liters

## 2017-11-10 ENCOUNTER — Telehealth (HOSPITAL_COMMUNITY): Payer: Self-pay

## 2017-11-10 NOTE — Telephone Encounter (Signed)
Contacted Ms. [place] regarding appointment for either today or tomorrow, she states she isnt feeling well today so def not today and I could call back in the morning, I asked her what symptoms she was having and she replied her head and stomach isnt feeling well. I asked her about her weights and she states her weight hasnt went down at all and then she said thank you and then hung up the phone. I will try again tomor morning.

## 2017-11-11 ENCOUNTER — Telehealth (HOSPITAL_COMMUNITY): Payer: Self-pay

## 2017-11-11 NOTE — Telephone Encounter (Signed)
Attempted to reach pt again twice today, she did not answer neither time and unable to leave message.  Will meet her at clinic appointment next week.   Kerry Hough, EMT -Paramedic 11/11/17

## 2017-11-15 ENCOUNTER — Telehealth (HOSPITAL_COMMUNITY): Payer: Self-pay

## 2017-11-15 NOTE — Telephone Encounter (Signed)
Called pt today to sch home visit-pt did not answer either phone numbers listed and unable to leave VM on her numbers.   Kerry [place]ough, EMT-Paramedic 11/15/17

## 2017-11-17 ENCOUNTER — Telehealth (HOSPITAL_COMMUNITY): Payer: Self-pay | Admitting: Surgery

## 2017-11-17 NOTE — Telephone Encounter (Signed)
I left a message for patient to encourage her to come to the [facility] meeting tomorrow.

## 2017-11-18 ENCOUNTER — Ambulatory Visit (HOSPITAL_COMMUNITY)
Admission: RE | Admit: 2017-11-18 | Discharge: 2017-11-18 | Disposition: A | Discharge status: Home / Self Care | Payer: Medicare Other | Source: Ambulatory Visit | Attending: Cardiology | Admitting: Cardiology

## 2017-11-18 ENCOUNTER — Encounter (HOSPITAL_COMMUNITY): Payer: Self-pay

## 2017-11-18 ENCOUNTER — Inpatient Hospital Stay (HOSPITAL_COMMUNITY): Payer: Medicare Other

## 2017-11-18 ENCOUNTER — Other Ambulatory Visit: Payer: Self-pay

## 2017-11-18 ENCOUNTER — Inpatient Hospital Stay (HOSPITAL_COMMUNITY)
Admission: AD | Admit: 2017-11-18 | Discharge: 2017-11-26 | DRG: 291 | Disposition: A | Discharge status: Home Health | Payer: Medicare Other | Source: Ambulatory Visit | Attending: Cardiology | Admitting: Cardiology

## 2017-11-18 ENCOUNTER — Other Ambulatory Visit (HOSPITAL_COMMUNITY): Payer: Self-pay

## 2017-11-18 VITALS — BP 156/100 | HR 92 | Wt 231.8 lb

## 2017-11-18 DIAGNOSIS — Z91011 Allergy to milk products: Secondary | ICD-10-CM

## 2017-11-18 DIAGNOSIS — I255 Ischemic cardiomyopathy: Secondary | ICD-10-CM | POA: Diagnosis present

## 2017-11-18 DIAGNOSIS — Z9111 Patient's noncompliance with dietary regimen: Secondary | ICD-10-CM

## 2017-11-18 DIAGNOSIS — R188 Other ascites: Secondary | ICD-10-CM

## 2017-11-18 DIAGNOSIS — E876 Hypokalemia: Secondary | ICD-10-CM | POA: Diagnosis present

## 2017-11-18 DIAGNOSIS — Z79899 Other long term (current) drug therapy: Secondary | ICD-10-CM | POA: Diagnosis not present

## 2017-11-18 DIAGNOSIS — N183 Chronic kidney disease, stage 3 unspecified: Secondary | ICD-10-CM

## 2017-11-18 DIAGNOSIS — I5043 Acute on chronic combined systolic (congestive) and diastolic (congestive) heart failure: Secondary | ICD-10-CM | POA: Diagnosis not present

## 2017-11-18 DIAGNOSIS — F17210 Nicotine dependence, cigarettes, uncomplicated: Secondary | ICD-10-CM | POA: Diagnosis present

## 2017-11-18 DIAGNOSIS — E785 Hyperlipidemia, unspecified: Secondary | ICD-10-CM | POA: Diagnosis present

## 2017-11-18 DIAGNOSIS — R140 Abdominal distension (gaseous): Secondary | ICD-10-CM

## 2017-11-18 DIAGNOSIS — I340 Nonrheumatic mitral (valve) insufficiency: Secondary | ICD-10-CM | POA: Diagnosis not present

## 2017-11-18 DIAGNOSIS — I2510 Atherosclerotic heart disease of native coronary artery without angina pectoris: Secondary | ICD-10-CM | POA: Diagnosis present

## 2017-11-18 DIAGNOSIS — M069 Rheumatoid arthritis, unspecified: Secondary | ICD-10-CM | POA: Diagnosis present

## 2017-11-18 DIAGNOSIS — Z955 Presence of coronary angioplasty implant and graft: Secondary | ICD-10-CM

## 2017-11-18 DIAGNOSIS — Z91018 Allergy to other foods: Secondary | ICD-10-CM

## 2017-11-18 DIAGNOSIS — I252 Old myocardial infarction: Secondary | ICD-10-CM | POA: Diagnosis not present

## 2017-11-18 DIAGNOSIS — K219 Gastro-esophageal reflux disease without esophagitis: Secondary | ICD-10-CM | POA: Diagnosis present

## 2017-11-18 DIAGNOSIS — Z886 Allergy status to analgesic agent status: Secondary | ICD-10-CM

## 2017-11-18 DIAGNOSIS — Z9114 Patient's other noncompliance with medication regimen: Secondary | ICD-10-CM

## 2017-11-18 DIAGNOSIS — Z888 Allergy status to other drugs, medicaments and biological substances status: Secondary | ICD-10-CM

## 2017-11-18 DIAGNOSIS — R0600 Dyspnea, unspecified: Secondary | ICD-10-CM

## 2017-11-18 DIAGNOSIS — I130 Hypertensive heart and chronic kidney disease with heart failure and stage 1 through stage 4 chronic kidney disease, or unspecified chronic kidney disease: Principal | ICD-10-CM | POA: Diagnosis present

## 2017-11-18 DIAGNOSIS — I5023 Acute on chronic systolic (congestive) heart failure: Secondary | ICD-10-CM | POA: Diagnosis not present

## 2017-11-18 DIAGNOSIS — I10 Essential (primary) hypertension: Secondary | ICD-10-CM

## 2017-11-18 LAB — COMPREHENSIVE METABOLIC PANEL
ALT: 14 U/L (ref 14–54)
AST: 26 U/L (ref 15–41)
Albumin: 3.4 g/dL — ABNORMAL LOW (ref 3.5–5.0)
Alkaline Phosphatase: 246 U/L — ABNORMAL HIGH (ref 38–126)
Anion gap: 12 (ref 5–15)
BUN: 14 mg/dL (ref 6–20)
CO2: 22 mmol/L (ref 22–32)
Calcium: 9.1 mg/dL (ref 8.9–10.3)
Chloride: 107 mmol/L (ref 101–111)
Creatinine, Ser: 1.01 mg/dL — ABNORMAL HIGH (ref 0.44–1.00)
GFR calc Af Amer: >60 mL/min (ref >60)
GFR calc non Af Amer: 57 mL/min — ABNORMAL LOW (ref >60)
Glucose, Bld: 92 mg/dL (ref 65–99)
Potassium: 3.2 mmol/L — ABNORMAL LOW (ref 3.5–5.1)
Sodium: 141 mmol/L (ref 135–145)
Total Bilirubin: 3.2 mg/dL — ABNORMAL HIGH (ref 0.3–1.2)
Total Protein: 7.1 g/dL (ref 6.5–8.1)

## 2017-11-18 LAB — CBC WITH DIFFERENTIAL/PLATELET
Basophils Absolute: 0.1 10*3/uL (ref 0.0–0.1)
Basophils Relative: 1 %
Eosinophils Absolute: 0.1 10*3/uL (ref 0.0–0.7)
Eosinophils Relative: 2 %
HCT: 47.1 % — ABNORMAL HIGH (ref 36.0–46.0)
Hemoglobin: 14.6 g/dL (ref 12.0–15.0)
Lymphocytes Relative: 21 %
Lymphs Abs: 1.6 10*3/uL (ref 0.7–4.0)
MCH: 26.1 pg (ref 26.0–34.0)
MCHC: 31 g/dL (ref 30.0–36.0)
MCV: 84.3 fL (ref 78.0–100.0)
Monocytes Absolute: 0.7 10*3/uL (ref 0.1–1.0)
Monocytes Relative: 9 %
Neutro Abs: 5.1 10*3/uL (ref 1.7–7.7)
Neutrophils Relative %: 67 %
Platelets: 258 10*3/uL (ref 150–400)
RBC: 5.59 MIL/uL — ABNORMAL HIGH (ref 3.87–5.11)
RDW: 19.4 % — ABNORMAL HIGH (ref 11.5–15.5)
WBC: 7.5 10*3/uL (ref 4.0–10.5)

## 2017-11-18 LAB — TROPONIN I
Troponin I: <0.03 ng/mL (ref <0.03)
Troponin I: <0.03 ng/mL (ref <0.03)

## 2017-11-18 LAB — BRAIN NATRIURETIC PEPTIDE: B Natriuretic Peptide: 1869.4 pg/mL — ABNORMAL HIGH (ref 0.0–100.0)

## 2017-11-18 LAB — TSH: TSH: 1.708 u[IU]/mL (ref 0.350–4.500)

## 2017-11-18 LAB — MAGNESIUM: Magnesium: 1.9 mg/dL (ref 1.7–2.4)

## 2017-11-18 MED ORDER — FUROSEMIDE 10 MG/ML IJ SOLN
80.0000 mg | Freq: Two times a day (BID) | INTRAMUSCULAR | Status: DC
  Administered 2017-11-18 – 2017-11-21 (×6): 80 mg via INTRAVENOUS
  Filled 2017-11-18 (×6): qty 8

## 2017-11-18 MED ORDER — SODIUM CHLORIDE 0.9 % IV SOLN: 250.0000 mL | INTRAVENOUS | Status: DC | PRN

## 2017-11-18 MED ORDER — ONDANSETRON HCL 4 MG/2ML IJ SOLN: 4.0000 mg | Freq: Four times a day (QID) | INTRAMUSCULAR | Status: DC | PRN

## 2017-11-18 MED ORDER — TICAGRELOR 60 MG PO TABS
60.0000 mg | ORAL_TABLET | Freq: Two times a day (BID) | ORAL | Status: DC
  Administered 2017-11-18 – 2017-11-26 (×16): 60 mg via ORAL
  Filled 2017-11-18 (×16): qty 1

## 2017-11-18 MED ORDER — FERROUS SULFATE 325 (65 FE) MG PO TABS
325.0000 mg | ORAL_TABLET | Freq: Three times a day (TID) | ORAL | Status: DC
  Administered 2017-11-19 – 2017-11-26 (×20): 325 mg via ORAL
  Filled 2017-11-18 (×21): qty 1

## 2017-11-18 MED ORDER — LOSARTAN POTASSIUM 25 MG PO TABS
25.0000 mg | ORAL_TABLET | Freq: Every day | ORAL | Status: DC
  Filled 2017-11-18: qty 1

## 2017-11-18 MED ORDER — POTASSIUM CHLORIDE CRYS ER 20 MEQ PO TBCR
40.0000 meq | EXTENDED_RELEASE_TABLET | Freq: Every day | ORAL | Status: DC
  Administered 2017-11-18: 40 meq via ORAL
  Filled 2017-11-18: qty 2

## 2017-11-18 MED ORDER — SPIRONOLACTONE 25 MG PO TABS
25.0000 mg | ORAL_TABLET | Freq: Every day | ORAL | Status: DC
  Administered 2017-11-18 – 2017-11-26 (×9): 25 mg via ORAL
  Filled 2017-11-18 (×9): qty 1

## 2017-11-18 MED ORDER — ATORVASTATIN CALCIUM 40 MG PO TABS
40.0000 mg | ORAL_TABLET | Freq: Every day | ORAL | Status: DC
  Administered 2017-11-18 – 2017-11-26 (×9): 40 mg via ORAL
  Filled 2017-11-18 (×9): qty 1

## 2017-11-18 MED ORDER — LORATADINE 10 MG PO TABS
10.0000 mg | ORAL_TABLET | Freq: Every day | ORAL | Status: DC
  Administered 2017-11-19 – 2017-11-26 (×8): 10 mg via ORAL
  Filled 2017-11-18 (×9): qty 1

## 2017-11-18 MED ORDER — ACETAMINOPHEN 325 MG PO TABS
650.0000 mg | ORAL_TABLET | ORAL | Status: DC | PRN
  Administered 2017-11-23 – 2017-11-24 (×2): 650 mg via ORAL
  Filled 2017-11-18 (×4): qty 2

## 2017-11-18 MED ORDER — ENOXAPARIN SODIUM 40 MG/0.4ML ~~LOC~~ SOLN
40.0000 mg | SUBCUTANEOUS | Status: DC
  Administered 2017-11-19 – 2017-11-24 (×3): 40 mg via SUBCUTANEOUS
  Filled 2017-11-18 (×6): qty 0.4

## 2017-11-18 NOTE — Addendum Note (Signed)
Encounter addended by: Sherald Hess, NP on: 11/18/2017 3:57 PM  Actions taken: Sign clinical note

## 2017-11-18 NOTE — Progress Notes (Signed)
Pt educated about safety and importance of bed alarm during the night however pt refuses to be on bed alarm. Will continue to round on patient.   Shourya Macpherson, RN    

## 2017-11-18 NOTE — Progress Notes (Signed)
Paramedicine Encounter   Patient ID: [place] , female,   DOB: March 01, 1951,66 y.o.,  MRN: [id number]   Met patient in clinic today with provider.  Time spent with patient 30  Weight at home 226- Weight @ clinic-231  Pt states her phones are not working and that's why I havent been able to reach her. She is agreeable to me coming to her house. Her abd is extremely swollen. She is being admitted for diuresis. Will f/u on Monday if she is home.  Marylouise Stacks, EMT-Paramedic 11/18/2017   ACTION: Home visit completed

## 2017-11-18 NOTE — Progress Notes (Addendum)
Patient ID: [place], female   DOB: January 04, 1951, 67 y.o.   MRN: [id number]    Advanced Heart Failure Clinic Note   PCP: [name] Primary HF MD: Dr Shirlee Latch   HPI: [name] is a 67 y.o. female with a history of HTN, HLD, tobacco abuse, CAD s/panterior STEMI with early stent thrombosis for missing doses of Brilinta s/p PCI x 2 into LAD (04/2014) and chronic systolic HF with EF 20% by 3/16 echo.   Admitted to [facility] in 3/16 with CHF and R pleural effusion. Effusion tapped (transudative). Echo with EF down to 20%. Diuresed with IV lasix and transitioned torsemide + metolazone as needed for weight >187 pounds. Creatinine was 1.7 on the day discharge. Discharge weight 183 pounds.   Admitted 09/22/2017 with a/c systolic heart failure. Diuresed with IV lasix the transitioned to lasix 60 mg twice a day. Discharge weight 206 pounds.    Today she returns for HF follow up. Last visit torsemide was increased to 60 mg twice a day.Overall feeling fair. SOB with exertion. + orthopnea. Weight at home trending up to 226 pounds despite increasing increasing torsemide at home.  Appetite ok. Says she has been eating bacon egg and cheese + wings.  No fever or chills.  Taking all medications. Did not take brillinta today.     9/16: Cardiac MRI with EF 19%, LAD territory scar (nonviable), no thrombus.  07/25/2015: ECHO EF 25% RV normal 01/30/2015 ECHO EF 20%  11/13/2014: ECHO EF 35-40% RV normal.  02/21/16 ECHO EF 15%. Mod reduced RV function, severe TR 09/2017 ECHO EF 20-25%. RV dilated   SH:  Social History   Socioeconomic History  . Marital status: Widowed    Spouse name: Not on file  . Number of children: Not on file  . Years of education: Not on file  . Highest education level: Not on file  Social Needs  . Financial resource strain: Not on file  . Food insecurity - worry: Not on file  . Food insecurity - inability: Not on file  . Transportation needs - medical: Not on file  .  Transportation needs - non-medical: Not on file  Occupational History  . Occupation: not employed  Tobacco Use  . Smoking status: Current Some Day Smoker    Packs/day: 0.10    Years: 0.50    Pack years: 0.05    Types: Cigarettes  . Smokeless tobacco: Current User  . Tobacco comment: down to 3 cigarettes daily (08/03/14)  Substance and Sexual Activity  . Alcohol use: No  . Drug use: No  . Sexual activity: Not on file  Other Topics Concern  . Not on file  Social History Narrative   Patient has 6 brothers and sisters and none have known coronary artery disease. She lives alone in [place], but has several brothers in the area.    FH:  Family History  Problem Relation Age of Onset  . Diabetes Mother   . Hypertension Mother     Past Medical History:  Diagnosis Date  . CAD (coronary artery disease) 05/03/14; 05/09/14   a. anterior STEMI with early in-stent thorombosis for missed dose of Brillinta s/p PCI with DESx 2 into LAD (04/2014)  . CHF (congestive heart failure) (HCC)   . GERD (gastroesophageal reflux disease)    Probable  . HTN (hypertension)   . Hyperlipidemia   . Ischemic cardiomyopathy    a. 04/2014 ECHO with EF 45-50% b.  Repeat 2D echo 08/14/14 with EF down  at 15%. Life vest placed  . Non compliance w medication regimen   . Tobacco use     Current Outpatient Medications  Medication Sig Dispense Refill  . atorvastatin (LIPITOR) 80 MG tablet Take 40 mg by mouth daily.    . cetirizine (ZYRTEC) 10 MG tablet Take 10 mg daily by mouth.     . cholecalciferol (VITAMIN D) 1000 units tablet Take 1,000 Units by mouth. Twice a month    . ferrous sulfate 325 (65 FE) MG tablet Take 325 mg 3 (three) times daily by mouth. Monday,Wednesday, friday    . magnesium oxide (MAG-OX) 400 (241.3 Mg) MG tablet Take 400 mg by mouth. Twice a month    . nitroGLYCERIN (NITROSTAT) 0.4 MG SL tablet Place 0.4 mg under the tongue every 5 (five) minutes as needed for chest pain.    . potassium  chloride SA (K-DUR,KLOR-CON) 20 MEQ tablet Take 2 tablets (40 mEq total) by mouth daily. 60 tablet 6  . spironolactone (ALDACTONE) 25 MG tablet Take 1 tablet (25 mg total) by mouth daily. 30 tablet 3  . ticagrelor (BRILINTA) 60 MG TABS tablet Take 1 tablet (60 mg total) by mouth 2 (two) times daily. 60 tablet 0  . torsemide (DEMADEX) 20 MG tablet Take 3 tablets (60 mg total) by mouth 2 (two) times daily. 180 tablet 6   No current facility-administered medications for this encounter.     Vitals:   11/18/17 1428  BP: (!) 156/100  Pulse: 92  SpO2: 95%  Weight: 231 lb 12.8 oz (105.1 kg)   Wt Readings from Last 3 Encounters:  11/18/17 231 lb 12.8 oz (105.1 kg)  11/04/17 225 lb (102.1 kg)  10/21/17 212 lb (96.2 kg)   PHYSICAL EXAM: General:  HEENT: normal Neck: supple. JVP to jaw. Carotids 2+ bilat; no bruits. No lymphadenopathy or thryomegaly appreciated. Cor: PMI nondisplaced. Regular rate & rhythm. No rubs, gallops or murmurs. Lungs: clear Abdomen: soft, nontender, +++distended. No hepatosplenomegaly. No bruits or masses. Good bowel sounds. Extremities: no cyanosis, clubbing, rash, R and LLE 3+ edema Neuro: alert & orientedx3, cranial nerves grossly intact. moves all 4 extremities w/o difficulty. Affect pleasant   ASSESSMENT & PLAN: 1.  Acute/Chronic systolic CHF:  Ischemic cardiomyopathy.  ECHO 09/2017 EF 20-25%.   NYHA IIIb. Marked volume overload despite increasing torsemide to 60 mg twice a day.  -Continue spiro 25 mg daily.  - Hold bb for now per patient request.   - Not on losartan with recent elevated renal function.  - Refuses to rechallenge entresto -Ideally she would take bidil but she will not take three times a day.   2. CAD: s/p anterior MI,  PCI x2 LAD 04/2014.  History of stent thrombosis. - Continue Brilinta 80 mg, atorvastatin 80 mg daily -No S/S ischemia  3. HTN-  - Elevate but I am not sure if she has been taking medications.    - Re-fuses to rechallenge  Entresto.  4. CKD stage III Creatinine 1.2-1.4.  5. Abdominal distention- check abd [place]. May need paracentesis.   Admit to telemetry for marked volume overload. Very difficult to manage due to noncompliance with diet and medications. Paramedicine to pick up after d/c.   Tonye Becket, NP-C  11/18/2017

## 2017-11-18 NOTE — H&P (Signed)
Advanced Heart Failure Team History and Physical Note   Primary Physician:   Primary Cardiologist: Dr Shirlee Latch     Reason for Admission: A/C Systolic Heart Failure    HPI:   Deborah Ho is a 67 y.o. female with a history of HTN, HLD, tobacco abuse, CAD s/panterior STEMI with early stent thrombosis for missing doses of Brilinta s/p PCI x 2 into LAD (04/2014) and chronic systolic HF with EF 20% by 3/16 echo.   Admitted to [facility] in 3/16 with CHF and R pleural effusion. Effusion tapped (transudative). Echo with EF down to 20%. Diuresed with IV lasix and transitioned torsemide + metolazone as needed for weight >187 pounds. Creatinine was 1.7 on the day discharge. Discharge weight 183 pounds.   Admitted 09/22/2017 with a/c systolic heart failure. Diuresed with IV lasix the transitioned to lasix 60 mg twice a day. Discharge weight 206 pounds.    Today she returned to the HF clinic. Last visit torsemide was increased to 60 mg twice a day.Overall feeling fair. SOB with exertion. + orthopnea. Weight at home trending up to 226 pounds despite increasing  torsemide at home.  Appetite ok. Says she has been eating bacon egg and cheese + wings.  No fever or chills. Taking all medications. Did not take brillinta today.     Review of Systems: [y] = yes, [ ]  = no   General: Weight gain [Y ]; Weight loss [ ] ; Anorexia [ ] ; Fatigue [ ] ; Fever [ ] ; Chills [ ] ; Weakness [ ]   Cardiac: Chest pain/pressure [ ] ; Resting SOB [ ] ; Exertional SOB [Y ]; Orthopnea [ Y]; Pedal Edema [Y ]; Palpitations [ ] ; Syncope [ ] ; Presyncope [ ] ; Paroxysmal nocturnal dyspnea[ ]   Pulmonary: Cough [ ] ; Wheezing[ ] ; Hemoptysis[ ] ; Sputum [ ] ; Snoring [ ]   GI: Vomiting[ ] ; Dysphagia[ ] ; Melena[ ] ; Hematochezia [ ] ; Heartburn[ ] ; Abdominal pain [ ] ; Constipation [ ] ; Diarrhea [ ] ; BRBPR [ ]   GU: Hematuria[ ] ; Dysuria [ ] ; Nocturia[ ]   Vascular: Pain in legs with walking [ ] ; Pain in feet with lying flat [ ] ; Non-healing  sores [ ] ; Stroke [ ] ; TIA [ ] ; Slurred speech [ ] ;  Neuro: Headaches[ ] ; Vertigo[ ] ; Seizures[ ] ; Paresthesias[ ] ;Blurred vision [ ] ; Diplopia [ ] ; Vision changes [ ]   Ortho/Skin: Arthritis [ ] ; Joint pain [Y ]; Muscle pain [ ] ; Joint swelling [ ] ; Back Pain [ ] ; Rash [ ]   Psych: Depression[ ] ; Anxiety[ ]   Heme: Bleeding problems [ ] ; Clotting disorders [ ] ; Anemia [ ]   Endocrine: Diabetes [ ] ; Thyroid dysfunction[ ]    Home Medications Prior to Admission medications   Medication Sig Start Date End Date Taking? Authorizing Provider  atorvastatin (LIPITOR) 80 MG tablet Take 40 mg by mouth daily.    [provider]  cetirizine (ZYRTEC) 10 MG tablet Take 10 mg daily by mouth.     [provider]  cholecalciferol (VITAMIN D) 1000 units tablet Take 1,000 Units by mouth. Twice a month    [provider]  ferrous sulfate 325 (65 FE) MG tablet Take 325 mg 3 (three) times daily by mouth. Monday,Wednesday, friday    [provider]  magnesium oxide (MAG-OX) 400 (241.3 Mg) MG tablet Take 400 mg by mouth. Twice a month    [provider]  nitroGLYCERIN (NITROSTAT) 0.4 MG SL tablet Place 0.4 mg under the tongue every 5 (five) minutes as needed for chest pain.  [provider]  potassium chloride SA (K-DUR,KLOR-CON) 20 MEQ tablet Take 2 tablets (40 mEq total) by mouth daily. 10/21/17   Clegg, Amy D, NP  spironolactone (ALDACTONE) 25 MG tablet Take 1 tablet (25 mg total) by mouth daily. 10/21/17   Clegg, Amy D, NP  ticagrelor (BRILINTA) 60 MG TABS tablet Take 1 tablet (60 mg total) by mouth 2 (two) times daily. 04/02/17   Dhungel, Theda Belfast, MD  torsemide (DEMADEX) 20 MG tablet Take 3 tablets (60 mg total) by mouth 2 (two) times daily. 11/04/17   Sherald Hess, NP    Past Medical History: Past Medical History:  Diagnosis Date  . CAD (coronary artery disease) 05/03/14; 05/09/14   a. anterior STEMI with early in-stent thorombosis for missed dose of Brillinta  s/p PCI with DESx 2 into LAD (04/2014)  . CHF (congestive heart failure) (HCC)   . GERD (gastroesophageal reflux disease)    Probable  . HTN (hypertension)   . Hyperlipidemia   . Ischemic cardiomyopathy    a. 04/2014 ECHO with EF 45-50% b.  Repeat 2D echo 08/14/14 with EF down at 15%. Life vest placed  . Non compliance w medication regimen   . Tobacco use     Past Surgical History: Past Surgical History:  Procedure Laterality Date  . CHOLECYSTECTOMY    . CORONARY ANGIOPLASTY WITH STENT PLACEMENT  05/03/14   STEMI- stent to LAD DES- Xience alpine  . CORONARY ANGIOPLASTY WITH STENT PLACEMENT  05/09/14   STEMI- overlapping stent to LAD, pt had missed a dose of Brilinta  . LEFT HEART CATHETERIZATION WITH CORONARY ANGIOGRAM N/A 05/03/2014   Procedure: LEFT HEART CATHETERIZATION WITH CORONARY ANGIOGRAM;  Surgeon: Peter M Swaziland, MD;  Location: [facility] CATH LAB;  Service: Cardiovascular;  Laterality: N/A;  . LEFT HEART CATHETERIZATION WITH CORONARY ANGIOGRAM N/A 05/09/2014   Procedure: LEFT HEART CATHETERIZATION WITH CORONARY ANGIOGRAM;  Surgeon: Corky Crafts, MD;  Location: [facility] CATH LAB;  Service: Cardiovascular;  Laterality: N/A;  . PARTIAL HYSTERECTOMY    . PERCUTANEOUS STENT INTERVENTION  05/03/2014   Procedure: PERCUTANEOUS STENT INTERVENTION;  Surgeon: Peter M Swaziland, MD;  Location: [facility] CATH LAB;  Service: Cardiovascular;;  DES Prox LAD     Family History: Family History  Problem Relation Age of Onset  . Diabetes Mother   . Hypertension Mother     Social History: Social History   Socioeconomic History  . Marital status: Widowed    Spouse name: Not on file  . Number of children: Not on file  . Years of education: Not on file  . Highest education level: Not on file  Social Needs  . Financial resource strain: Not on file  . Food insecurity - worry: Not on file  . Food insecurity - inability: Not on file  . Transportation needs - medical: Not on file  . Transportation needs -  non-medical: Not on file  Occupational History  . Occupation: not employed  Tobacco Use  . Smoking status: Current Some Day Smoker    Packs/day: 0.10    Years: 0.50    Pack years: 0.05    Types: Cigarettes  . Smokeless tobacco: Current User  . Tobacco comment: down to 3 cigarettes daily (08/03/14)  Substance and Sexual Activity  . Alcohol use: No  . Drug use: No  . Sexual activity: Not on file  Other Topics Concern  . Not on file  Social History Narrative   Patient has 6 brothers and sisters and none have known  coronary artery disease. She lives alone in [address], but has several brothers in the area.    Allergies:  Allergies  Allergen Reactions  . Aspirin Swelling    Chewable children's aspirin makes patients tongue and face swell  . Effient [Prasugrel] Swelling    Patient's tongue and face swells  . Entresto [Sacubitril-Valsartan] Other (See Comments)    dizziness  . Lactose Intolerance (Gi) Other (See Comments)    REACTION: stomach upset  . Robitussin Dm [Guaifenesin-Dm] Swelling    Patient's tongue swells  . Sulfa Antibiotics Swelling  . Other     Had to replace "catgut" with clamps  . Wheat Bran Other (See Comments)    REACTION: unknown    Objective:    Vital Signs:   BP: ()/()  Arterial Line BP: ()/()    There were no vitals filed for this visit.   Physical Exam     General:  Appears chronically ill.  No respiratory difficulty HEENT: Normal Neck: Supple. JVP to jaw. Carotids 2+ bilat; no bruits. No lymphadenopathy or thyromegaly appreciated. Cor: PMI nondisplaced. Regular rate & rhythm. No rubs, gallops or murmurs. Lungs: Clear Abdomen: Soft, nontender, +++ distended. No hepatosplenomegaly. No bruits or masses. Good bowel sounds. Extremities: No cyanosis, clubbing, rash, R and LLE 3+ edema Neuro: Alert & oriented x 3, cranial nerves grossly intact. moves all 4 extremities w/o difficulty. Affect pleasant.   Telemetry     EKG    Labs      Basic Metabolic Panel: No results for input(s): NA, K, CL, CO2, GLUCOSE, BUN, CREATININE, CALCIUM, MG, PHOS in the last 168 hours.  Liver Function Tests: No results for input(s): AST, ALT, ALKPHOS, BILITOT, PROT, ALBUMIN in the last 168 hours. No results for input(s): LIPASE, AMYLASE in the last 168 hours. No results for input(s): AMMONIA in the last 168 hours.  CBC: No results for input(s): WBC, NEUTROABS, HGB, HCT, MCV, PLT in the last 168 hours.  Cardiac Enzymes: No results for input(s): CKTOTAL, CKMB, CKMBINDEX, TROPONINI in the last 168 hours.  BNP: BNP (last 3 results) Recent Labs    01/28/17 2047 03/26/17 1405 10/19/17 1426  BNP 1,920.0* 1,679.1* 1,172.7*    ProBNP (last 3 results) No results for input(s): PROBNP in the last 8760 hours.   CBG: No results for input(s): GLUCAP in the last 168 hours.  Coagulation Studies: No results for input(s): LABPROT, INR in the last 72 hours.  Imaging:  No results found.   Patient Profile  Deborah Ho is a 67 y.o. female with a history of HTN, HLD, tobacco abuse, CAD s/panterior STEMI with early stent thrombosis for missing doses of Brilinta s/p PCI x 2 into LAD (04/2014) and chronic systolic HF.   Admitting with A/C systolic heart failure.     Assessment/Plan   1.  Acute/Chronic systolic CHF:  Ischemic cardiomyopathy.  ECHO 09/2017 EF 20-25%.   NYHA IIIb. Marked volume overload despite increasing torsemide to 60 mg twice a day.  - Start lasix 80 mg IV twice a day.  -Continue spiro 25 mg daily.  - Hold bb for now per patient request.   - Start losartan 25 mg daily. . - Refuses to rechallenge entresto -Ideally she would take bidil but she will not take three times a day.   2. CAD: s/p anterior MI,  PCI x2 LAD 04/2014.  History of stent thrombosis. - Continue Brilinta 60 mg twice a day and atorvastatin 80 mg daily - No S/S ischemia  3. HTN-  -  Elevate but I am not sure if she has been taking medications.    -  Re-fuses to rechallenge Entresto.  4. CKD stage III Creatinine 1.2-1.4.  5. Abdominal distention- check abd [place]. May need paracentesis.   Admit to telemetry for marked volume overload. Very difficult to manage due to noncompliance with diet and medications. Paramedicine to pick up after d/c.  Repeat ECHO   Tonye Becket, NP 11/18/2017, 3:51 PM  Advanced Heart Failure Team Pager [phone] (M-F; 7a - 4p)  Please contact [facility] for night-coverage after hours (4p -7a ) and weekends on [email or web address]  Patient seen with NP, agree with the above note.  I performed an independent history and exam and adjusted the note above to reflect my thoughts. Patient has been noncompliant with medical treatment over time.  She presents today to clinic markedly volume overloaded on exam with orthopnea and NYHA class IIIb symptoms.   No chest pain.  She has ischemic cardiomyopathy with persistently low EF.  Has refused ICD in the past.    She will be admitted.  We will diurese her with Lasix 80 mg IV bid.  She will get losartan 25 mg daily and spironolactone 25 mg daily. Hold off on beta blocker for now with acute exacerbation.   No ischemic symptoms.  Continue home Brilinta and atorvastatin.   [name] 11/18/2017 4:19 PM

## 2017-11-19 ENCOUNTER — Inpatient Hospital Stay (HOSPITAL_COMMUNITY): Payer: Self-pay

## 2017-11-19 ENCOUNTER — Inpatient Hospital Stay (HOSPITAL_COMMUNITY): Payer: Medicare Other

## 2017-11-19 DIAGNOSIS — I5023 Acute on chronic systolic (congestive) heart failure: Secondary | ICD-10-CM

## 2017-11-19 LAB — BASIC METABOLIC PANEL
Anion gap: 10 (ref 5–15)
BUN: 16 mg/dL (ref 6–20)
CO2: 25 mmol/L (ref 22–32)
Calcium: 9.2 mg/dL (ref 8.9–10.3)
Chloride: 106 mmol/L (ref 101–111)
Creatinine, Ser: 1.12 mg/dL — ABNORMAL HIGH (ref 0.44–1.00)
GFR calc Af Amer: 58 mL/min — ABNORMAL LOW (ref >60)
GFR calc non Af Amer: 50 mL/min — ABNORMAL LOW (ref >60)
Glucose, Bld: 96 mg/dL (ref 65–99)
Potassium: 3.5 mmol/L (ref 3.5–5.1)
Sodium: 141 mmol/L (ref 135–145)

## 2017-11-19 LAB — TROPONIN I: Troponin I: <0.03 ng/mL (ref <0.03)

## 2017-11-19 MED ORDER — BENZONATATE 100 MG PO CAPS
200.0000 mg | ORAL_CAPSULE | Freq: Two times a day (BID) | ORAL | Status: DC | PRN
  Administered 2017-11-19 – 2017-11-21 (×3): 200 mg via ORAL
  Filled 2017-11-19 (×3): qty 2

## 2017-11-19 MED ORDER — LOSARTAN POTASSIUM 50 MG PO TABS
50.0000 mg | ORAL_TABLET | Freq: Every day | ORAL | Status: DC
  Administered 2017-11-20 – 2017-11-26 (×7): 50 mg via ORAL
  Filled 2017-11-19 (×8): qty 1

## 2017-11-19 MED ORDER — POTASSIUM CHLORIDE CRYS ER 20 MEQ PO TBCR
40.0000 meq | EXTENDED_RELEASE_TABLET | Freq: Three times a day (TID) | ORAL | Status: DC
  Administered 2017-11-19 – 2017-11-22 (×10): 40 meq via ORAL
  Filled 2017-11-19 (×11): qty 2

## 2017-11-19 NOTE — Plan of Care (Signed)
Nutrition Education Note  RD consulted for nutrition education regarding CHF.  Spoke with pt at bedside. She reports that she is "paranoid" about eating because she is unsure what to eat due to multiple diet restrictions. She reports that she will eat out occasionally, but will also cook at home. She is inquiring if the hospital provides a meal delivery service (which is not available).   Pt reports breakfast is typically a half of [employer] bacon, egg, and cheese biscuit, Lunch and Dinner consist of meat and vegetable (example grilled rib eye and collard greens). She mostly consumes water and fruit juice.   Focus on education was on simplifying food choices based on preferences to help her adhere to a low sodium diet. Reviewed ways pt could flavor food without addition of salt. Also discussed healthier choices when eating out.   RD provided "Low Sodium Nutrition Therapy" and "Sodium Free Seasoning Tips" handouts from the Academy of Nutrition and Dietetics. Reviewed patient's dietary recall. Provided examples on ways to decrease sodium intake in diet. Discouraged intake of processed foods and use of salt shaker. Encouraged fresh fruits and vegetables as well as whole grain sources of carbohydrates to maximize fiber intake.   RD discussed why it is important for patient to adhere to diet recommendations, and emphasized the role of fluids, foods to avoid, and importance of weighing self daily. Teach back method used.  Expect fair to poor compliance.  Body mass index is 37.77 kg/m. Pt meets criteria for obesity, class II based on current BMI.  Current diet order is 2 gram sodium with 2 L fluid restriction, patient is consuming approximately 100% of meals at this time. Labs and medications reviewed. No further nutrition interventions warranted at this time. RD contact information provided. If additional nutrition issues arise, please re-consult RD.   Novalyn Lajara A. Mayford Knife, RD, LDN, CDE Pager:  [phone] After hours Pager: [phone]

## 2017-11-19 NOTE — Progress Notes (Signed)
Called ortho tech, she informed RN that unna boots are out of stock system wide. PA Amy Clegg aware 

## 2017-11-19 NOTE — Progress Notes (Signed)
Ultrasound called to inform of order placed incorrectly CHF PA pager number provided to ultrasound technician

## 2017-11-19 NOTE — Progress Notes (Signed)
Advanced Heart Failure Rounding Note  PCP:  Primary Cardiologist: Dr Shirlee Latch    Subjective:     Admitted from HF clinic with marked volume overload. So far she has received 80 mg IV lasix. Negative 1.3 liters.   Denies SOB. She is requesting that food is delivered to her home and request a dietitian consult. She said "I have not idea how this happens. Its just such a mystery".    Objective:   Weight Range: 227 lb (103 kg) Body mass index is 37.77 kg/m.   Vital Signs:   Temp:  [97.6 F (36.4 C)-98.6 F (37 C)] 98.6 F (37 C) (01/04 0444) Pulse Rate:  [83-91] 86 (01/04 0444) Resp:  [18] 18 (01/04 0444) BP: (136-152)/(79-100) 136/87 (01/04 0444) SpO2:  [96 %-100 %] 97 % (01/04 0444) Weight:  [227 lb (103 kg)-228 lb 9.6 oz (103.7 kg)] 227 lb (103 kg) (01/04 0444) Last BM Date: 11/18/17  Weight change: Filed Weights   11/18/17 1701 11/19/17 0444  Weight: 228 lb 9.6 oz (103.7 kg) 227 lb (103 kg)    Intake/Output:   Intake/Output Summary (Last 24 hours) at 11/19/2017 0725 Last data filed at 11/19/2017 0533 Gross per 24 hour  Intake 120 ml  Output 1450 ml  Net -1330 ml      Physical Exam    General:  Appears chronically ill. No resp difficulty. Sitting on the side of the bed.  HEENT: Normal Neck: Supple. JVP to jaw . Carotids 2+ bilat; no bruits. No lymphadenopathy or thyromegaly appreciated. Cor: PMI nondisplaced. Regular rate & rhythm. No rubs, gallops or murmurs. Lungs: Clear Abdomen: Soft, nontender, distended. No hepatosplenomegaly. No bruits or masses. Good bowel sounds. Extremities: No cyanosis, clubbing, rash, R and LLE 3+ edema Neuro: Alert & orientedx3, cranial nerves grossly intact. moves all 4 extremities w/o difficulty. Affect pleasant   Telemetry   NSR 80s personally reviewed.   EKG   n/a  Labs    CBC Recent Labs    11/18/17 1707  WBC 7.5  NEUTROABS 5.1  HGB 14.6  HCT 47.1*  MCV 84.3  PLT 258   Basic Metabolic Panel Recent Labs      11/18/17 1707  NA 141  K 3.2*  CL 107  CO2 22  GLUCOSE 92  BUN 14  CREATININE 1.01*  CALCIUM 9.1  MG 1.9   Liver Function Tests Recent Labs    11/18/17 1707  AST 26  ALT 14  ALKPHOS 246*  BILITOT 3.2*  PROT 7.1  ALBUMIN 3.4*   No results for input(s): LIPASE, AMYLASE in the last 72 hours. Cardiac Enzymes Recent Labs    11/18/17 1707 11/18/17 2232  TROPONINI <0.03 <0.03    BNP: BNP (last 3 results) Recent Labs    03/26/17 1405 10/19/17 1426 11/18/17 1707  BNP 1,679.1* 1,172.7* 1,869.4*    ProBNP (last 3 results) No results for input(s): PROBNP in the last 8760 hours.   D-Dimer No results for input(s): DDIMER in the last 72 hours. Hemoglobin A1C No results for input(s): HGBA1C in the last 72 hours. Fasting Lipid Panel No results for input(s): CHOL, HDL, LDLCALC, TRIG, CHOLHDL, LDLDIRECT in the last 72 hours. Thyroid Function Tests Recent Labs    11/18/17 1707  TSH 1.708    Other results:   Imaging    Dg Chest Port 1 View  Result Date: 11/18/2017 CLINICAL DATA:  Dyspnea EXAM: PORTABLE CHEST 1 VIEW COMPARISON:  09/22/2017 FINDINGS: Shallow inspiration. Cardiac enlargement with mild vascular  congestion. Blunting of the costophrenic angles suggests small bilateral pleural effusions. No edema or consolidation. Calcification of the aorta. No pneumothorax. IMPRESSION: Cardiac enlargement with mild pulmonary vascular congestion and small bilateral pleural effusions. Aortic atherosclerosis. Electronically Signed   By: Burman Nieves M.D.   On: 11/18/2017 19:31      Medications:     Scheduled Medications: . atorvastatin  40 mg Oral Daily  . enoxaparin (LOVENOX) injection  40 mg Subcutaneous Q24H  . ferrous sulfate  325 mg Oral TID  . furosemide  80 mg Intravenous BID  . loratadine  10 mg Oral Daily  . losartan  25 mg Oral Daily  . potassium chloride SA  40 mEq Oral Daily  . spironolactone  25 mg Oral Daily  . ticagrelor  60 mg Oral BID      Infusions: . sodium chloride       PRN Medications:  sodium chloride, acetaminophen, ondansetron (ZOFRAN) IV    Patient Profile   Mrs. Hutchcraft is a 67 y.o. female with a history of HTN, HLD, tobacco abuse, CAD s/panterior STEMI with early stent thrombosis for missing doses of Brilinta s/p PCI x 2 into LAD (04/2014) and chronic systolic HF.   Admitting with A/C systolic heart failure.      Assessment/Plan  1. Acute/Chronic systolic CHF: Ischemic cardiomyopathy.  ECHO 09/2017 EF 20-25%. Refuses ICD.  NYHA IIIb.Marked volume overload despite increasing torsemide to 60 mg twice a day at home. I doubt she was taking medications at home.  - She has had a good response with one dose of IV lasix. Continue 80 mg IV lasix twice a day.  - Renal function stable.   - Continue spiro 25 mg daily.  - Hold bb for now per patient request.  - Increase losartan to 50 mg twice a day.  Refuses to rechallenge Entresto. - Ideally she would take Bidil but she will not take three times a day.  - Place unna boots to help with edema.  2. CAD: s/p anterior MI, PCI x2 LAD 04/2014. History of stent thrombosis probably due to noncompliance.  She is allergic to aspirin. No chest pain.  - Continue Brilinta 60 mg twice a day and atorvastatin 80 mg daily  3. HTN: BP Elevated.  - Increase losartan to 50 mg daily.  - Re-fuses to rechallenge Entresto.  4. CKD stage III: Creatinine 1.2-1.4. - Stable today.  5. Abdominal distention- check abd [place]. May need paracentesis.  6. Hypokalemia: Increase K to 40 meq three times a day.   Consult dietitian for HF diet.   Medication concerns reviewed with patient and pharmacy team. Barriers identified:   Ongoing medication compliance issues at home. Offered Paramedicine but she is not sure wants this free service. We discussed again today.   Length of Stay: 1  Amy Clegg, NP  11/19/2017, 7:25 AM  Advanced Heart Failure Team Pager [phone] (M-F; 7a - 4p)   Please contact [facility] for night-coverage after hours (4p -7a ) and weekends on [email or web address]  Patient seen with NP, agree with the above note.  She returns volume overloaded with NYHA class IIIb symptoms. I suspect most of the problem is noncompliance with diet and medications at home.  I think psychiatric issues play a significant role here, she has refused evaluation in the past.  Paramedicine is following now.   - Diuresis as above with Lasix 80 mg IV bid.  - Increase losartan and continue spironolactone.  Would not be able to manage  tid Bidil. - Restart low dose beta blocker before discharge.  - Agree with abdominal US for ascites.    No chest pain, doubt ACS.    [name] 11/19/2017 8:51 AM

## 2017-11-19 NOTE — Progress Notes (Signed)
Ms. Ingerson is very well known to me from previous hospitalizations as well as HF Clinic visits.  She was previously and again recently enrolled in the HF [employer], however she refuses to allow Paramedics into her home.  She has been difficult to reach by phone or is not home in order to make home visits.  Ms. Eckstein lacks insight into her illness and I am unsure if she even takes medications at home.  In the past she has stated that she only takes her "meds when her BP is above 160".  I have reviewed HF recommendations for home with her many times including the importance of daily weights, when to call the physician, fluid restriction, a low sodium diet, and importance of prescribed medications.  [facility] will continue to try to reach her after her discharge to arrange home visits and facilitate compliance if possible.

## 2017-11-19 NOTE — Progress Notes (Signed)
I stopped in to speak to Deborah Ho regarding her admission and continuing HF [employer].  She says that she was taking medications and that she feels the diuretics were not working at home.  She is concerned about the foods she eats and asks if she can get the "hospital food" brought in to her house. I told her that I am not familiar with any program that supplies hospital food to her home.  She asks if she should drink "water or pop" at home.  Again I reiterate that all fluids are the same in terms of her HF and that she should not drink too much of anything--pop, water, tea, juice or coffee.  She is agreeable to continuing the Paramedicine program and says her phones (both cell and home) have been "messed up".  I will continue to follow her progress and assist her through HF Paramedicine Program as needed.

## 2017-11-19 NOTE — Progress Notes (Signed)
Orthopedic Tech Progress Note Patient Details:  Deborah Ho 1951-10-11 [id number]  Patient ID: Deborah Ho, female   DOB: [birth year], 67 y.o.   MRN: [id number]   Deborah Ho 11/19/2017, 9:46 AMSPD out of unna boot wrap.

## 2017-11-19 NOTE — Progress Notes (Signed)
CARDIAC REHAB PHASE I   PRE:  Rate/Rhythm: 86 SR  BP:  Supine:   Sitting: 150/95  Standing:    SaO2: 100%RA  MODE:  Ambulation: 300 ft   POST:  Rate/Rhythm: 97  BP:  Supine:   Sitting: 163/88,  160/93  Standing:    SaO2: 100%RA 1350-1432 Pt walked 300 ft on RA with slow steady gait. Tolerated without SOB but did c/o legs and thighs tight. Firm to touch. Discussed daily weights and when to call MD. Pt sated she has been seen by dietitian and had a good session.    [name], RN BSN  11/19/2017 2:30 PM

## 2017-11-19 NOTE — Progress Notes (Signed)
Pt had 7 beats of Vtach per CCMD, strip saved  Pt non symptomatic  Informed MD  Will continue to monitor

## 2017-11-19 NOTE — Care Management Note (Signed)
Case Management Note  Patient Details  Name: Deborah Ho MRN: [id number] Date of Birth: [birth year]  Subjective/Objective:     CHF              Action/Plan: Patient is well known to me from previous admission; PCP: [name]; has private insurance with Medicare; patient would benefit from a Disease Management Program for CHF; HHC choice offered, pt chose [facility]; [place] with [facility] called for arrangements. CM will continue to follow for progression of care.  Expected Discharge Date:    possibly 11/23/2017              Expected Discharge Plan:  Home w Home Health Services  Discharge planning Services  CM Consult  Choice offered to:  Patient   HH Arranged:  RN [facility] Agency:  [facility]  Status of Service:  In process, will continue to follow  [name] [phone] 11/19/2017, 2:41 PM

## 2017-11-20 ENCOUNTER — Inpatient Hospital Stay (HOSPITAL_COMMUNITY): Payer: Medicare Other

## 2017-11-20 LAB — BASIC METABOLIC PANEL
Anion gap: 14 (ref 5–15)
BUN: 17 mg/dL (ref 6–20)
CO2: 27 mmol/L (ref 22–32)
Calcium: 9.4 mg/dL (ref 8.9–10.3)
Chloride: 100 mmol/L — ABNORMAL LOW (ref 101–111)
Creatinine, Ser: 1.14 mg/dL — ABNORMAL HIGH (ref 0.44–1.00)
GFR calc Af Amer: 57 mL/min — ABNORMAL LOW (ref >60)
GFR calc non Af Amer: 49 mL/min — ABNORMAL LOW (ref >60)
Glucose, Bld: 100 mg/dL — ABNORMAL HIGH (ref 65–99)
Potassium: 3.8 mmol/L (ref 3.5–5.1)
Sodium: 141 mmol/L (ref 135–145)

## 2017-11-20 MED ORDER — MAGNESIUM HYDROXIDE 400 MG/5ML PO SUSP
15.0000 mL | Freq: Every day | ORAL | Status: DC | PRN
  Administered 2017-11-20: 15 mL via ORAL
  Filled 2017-11-20: qty 30

## 2017-11-20 NOTE — Progress Notes (Signed)
RN paged ortho tech regarding [employer] There is still a shortage of the boot. The ortho tech states they will bring it as soon as the supplies come in.

## 2017-11-20 NOTE — Progress Notes (Signed)
Patient stated that she is coughing bad all day and she needs something for cough. Non PRN to give. Paged cardiology on call, Tessalon ordered and given and according to the patient effective.  Will continue to monitor.  Cohen Boettner, RN

## 2017-11-20 NOTE — Progress Notes (Signed)
CARDIAC REHAB PHASE I   PRE:  Rate/Rhythm: 89 SR  BP:  Supine:   Sitting: 130/100 Standing:    SaO2: 99% RA  MODE:  Ambulation: 470 ft   POST:  Rate/Rhythm: 94  BP:  Supine:   Sitting: 154/97  Standing:    SaO2: 100% RA  1115-1155 Patient tolerated ambulation well with assist x1. Blood pressure elevated prior to ambulation 130/100, checked with patient's nurse and ok to walk as long as patient not having CP. Pt asymptomatic. Ambulated 470 ft without c/o, BP 154/97 after walk. Exercise guidelines given and pt verbalizes understanding of activity progression and endpoints for exercise.    Artist Pais, MS, ACSM CEP

## 2017-11-20 NOTE — Progress Notes (Signed)
Progress Note  Patient Name: Deborah Ho Date of Encounter: 11/20/2017  Primary Cardiologist: Jearld Pies  Subjective   SOB improving but not resolved.   Inpatient Medications    Scheduled Meds: . atorvastatin  40 mg Oral Daily  . enoxaparin (LOVENOX) injection  40 mg Subcutaneous Q24H  . ferrous sulfate  325 mg Oral TID  . furosemide  80 mg Intravenous BID  . loratadine  10 mg Oral Daily  . losartan  50 mg Oral Daily  . potassium chloride SA  40 mEq Oral TID  . spironolactone  25 mg Oral Daily  . ticagrelor  60 mg Oral BID   Continuous Infusions: . sodium chloride     PRN Meds: sodium chloride, acetaminophen, benzonatate, ondansetron (ZOFRAN) IV   Vital Signs    Vitals:   11/19/17 1649 11/19/17 2003 11/20/17 0019 11/20/17 0426  BP: (!) 140/94 135/78 (!) 129/99 (!) 140/93  Pulse: 87 87 87 86  Resp: 18 18 18 18   Temp:  98.1 F (36.7 C) (!) 97.5 F (36.4 C) (!) 97.5 F (36.4 C)  TempSrc:  Oral Oral Oral  SpO2:  98% 96% 99%  Weight:    224 lb 8 oz (101.8 kg)  Height:        Intake/Output Summary (Last 24 hours) at 11/20/2017 0823 Last data filed at 11/20/2017 0510 Gross per 24 hour  Intake 1200 ml  Output 3200 ml  Net -2000 ml   Filed Weights   11/18/17 1701 11/19/17 0444 11/20/17 0426  Weight: 228 lb 9.6 oz (103.7 kg) 227 lb (103 kg) 224 lb 8 oz (101.8 kg)    Telemetry    SR and sinus tach - Personally Reviewed  ECG    n/a  Physical Exam   GEN: No acute distress.   Neck: elevated jvd Cardiac: RRR, no murmurs, rubs, or gallops.  Respiratory: Clear to auscultation bilaterally. GI: Soft, nontender, non-distended  MS: trace bilateral edema; No deformity. Neuro:  Nonfocal  Psych: Normal affect   Labs    Chemistry Recent Labs  Lab 11/18/17 1707 11/19/17 0608 11/20/17 0513  NA 141 141 141  K 3.2* 3.5 3.8  CL 107 106 100*  CO2 22 25 27   GLUCOSE 92 96 100*  BUN 14 16 17   CREATININE 1.01* 1.12* 1.14*  CALCIUM 9.1 9.2 9.4  PROT 7.1  --    --   ALBUMIN 3.4*  --   --   AST 26  --   --   ALT 14  --   --   ALKPHOS 246*  --   --   BILITOT 3.2*  --   --   GFRNONAA 57* 50* 49*  GFRAA >60 58* 57*  ANIONGAP 12 10 14      Hematology Recent Labs  Lab 11/18/17 1707  WBC 7.5  RBC 5.59*  HGB 14.6  HCT 47.1*  MCV 84.3  MCH 26.1  MCHC 31.0  RDW 19.4*  PLT 258    Cardiac Enzymes Recent Labs  Lab 11/18/17 1707 11/18/17 2232 11/19/17 0608  TROPONINI <0.03 <0.03 <0.03   No results for input(s): TROPIPOC in the last 168 hours.   BNP Recent Labs  Lab 11/18/17 1707  BNP 1,869.4*     DDimer No results for input(s): DDIMER in the last 168 hours.   Radiology    01/16/18 Abdomen Limited  Result Date: 11/19/2017 CLINICAL DATA:  Ascites. EXAM: LIMITED ABDOMEN ULTRASOUND FOR ASCITES TECHNIQUE: Limited ultrasound survey for ascites was performed in all  four abdominal quadrants. COMPARISON:  03/27/2017 . FINDINGS: Mild ascites noted.  No prominent fluid collection noted. IMPRESSION: Mild ascites. Electronically Signed   By: Maisie Fus  Register   On: 11/19/2017 13:54   Dg Chest Port 1 View  Result Date: 11/18/2017 CLINICAL DATA:  Dyspnea EXAM: PORTABLE CHEST 1 VIEW COMPARISON:  09/22/2017 FINDINGS: Shallow inspiration. Cardiac enlargement with mild vascular congestion. Blunting of the costophrenic angles suggests small bilateral pleural effusions. No edema or consolidation. Calcification of the aorta. No pneumothorax. IMPRESSION: Cardiac enlargement with mild pulmonary vascular congestion and small bilateral pleural effusions. Aortic atherosclerosis. Electronically Signed   By: Burman Nieves M.D.   On: 11/18/2017 19:31    Cardiac Studies     Patient Profile     Mrs. Arbuthnot is a 67 y.o. female with a history of HTN, HLD, tobacco abuse, CAD s/panterior STEMI with early stent thrombosis for missing doses of Brilinta s/p PCI x 2 into LAD (04/2014) and chronic systolic HF.   Admitting with A/C systolic heart  failure.    Assessment & Plan    1. Acute on chronic systolic HF - 09/2017 echo LVEF 20-25%, restrictive diastolic function, RV dysfunction - repeat echo pending today. - negative 2L yesterday, negative 3.3 liters since admssion. She is on lasix 80mg  IV bid, mild uptrend in Cr - patient asked to hold beta blocker, plans to restart low dose back before discharge. . SHe remain on losartan and aldactone. \ - from notes compliance remains a big issue  2. CAD - history of anterior MI, PCI x2 LAD 04/2014. History of stent thrombosis probably due to noncompliance.  She is allergic to aspirin. Has been maintained on long term low dose brillina.   3. HTN - elevated this admit, losartan was increased to 50mg  yesterday  4. Abdominal distension - only mild ascites by 05/2014   For questions or updates, please contact [facility] Please consult [email or web address] for contact info under Cardiology/STEMI.      , MD  11/20/2017, 8:23 AM

## 2017-11-21 ENCOUNTER — Inpatient Hospital Stay (HOSPITAL_COMMUNITY): Payer: Medicare Other

## 2017-11-21 ENCOUNTER — Other Ambulatory Visit (HOSPITAL_COMMUNITY): Payer: Self-pay

## 2017-11-21 DIAGNOSIS — I340 Nonrheumatic mitral (valve) insufficiency: Secondary | ICD-10-CM

## 2017-11-21 LAB — BASIC METABOLIC PANEL WITH GFR
Anion gap: 11 (ref 5–15)
BUN: 19 mg/dL (ref 6–20)
CO2: 25 mmol/L (ref 22–32)
Calcium: 9.9 mg/dL (ref 8.9–10.3)
Chloride: 101 mmol/L (ref 101–111)
Creatinine, Ser: 1.11 mg/dL — ABNORMAL HIGH (ref 0.44–1.00)
GFR calc Af Amer: 59 mL/min — ABNORMAL LOW
GFR calc non Af Amer: 51 mL/min — ABNORMAL LOW
Glucose, Bld: 94 mg/dL (ref 65–99)
Potassium: 4.5 mmol/L (ref 3.5–5.1)
Sodium: 137 mmol/L (ref 135–145)

## 2017-11-21 LAB — ECHOCARDIOGRAM COMPLETE
Height: 65 in
Weight: 3512 [oz_av]

## 2017-11-21 LAB — MAGNESIUM: Magnesium: 2 mg/dL (ref 1.7–2.4)

## 2017-11-21 MED ORDER — PERFLUTREN LIPID MICROSPHERE
1.0000 mL | INTRAVENOUS | Status: AC | PRN
  Administered 2017-11-21: 2 mL via INTRAVENOUS
  Filled 2017-11-21: qty 10

## 2017-11-21 MED ORDER — FUROSEMIDE 10 MG/ML IJ SOLN
80.0000 mg | Freq: Three times a day (TID) | INTRAMUSCULAR | Status: DC
  Administered 2017-11-21 – 2017-11-25 (×14): 80 mg via INTRAVENOUS
  Filled 2017-11-21 (×14): qty 8

## 2017-11-21 NOTE — Progress Notes (Signed)
2D Echocardiogram has been performed.  Deborah Ho 11/21/2017, 3:48 PM

## 2017-11-21 NOTE — Progress Notes (Signed)
Progress Note  Patient Name: Deborah Ho Date of Encounter: 11/21/2017  Primary Cardiologist:McCLean  Subjective   No SOB. Still with fullness in her stomach and legs, edema.   Inpatient Medications    Scheduled Meds: . atorvastatin  40 mg Oral Daily  . enoxaparin (LOVENOX) injection  40 mg Subcutaneous Q24H  . ferrous sulfate  325 mg Oral TID  . furosemide  80 mg Intravenous BID  . loratadine  10 mg Oral Daily  . losartan  50 mg Oral Daily  . potassium chloride SA  40 mEq Oral TID  . spironolactone  25 mg Oral Daily  . ticagrelor  60 mg Oral BID   Continuous Infusions: . sodium chloride     PRN Meds: sodium chloride, acetaminophen, benzonatate, magnesium hydroxide, ondansetron (ZOFRAN) IV   Vital Signs    Vitals:   11/20/17 1953 11/21/17 0533 11/21/17 0535 11/21/17 0733  BP: 140/89 127/77  140/81  Pulse: 89 82  83  Resp: 16 17  16   Temp: 98 F (36.7 C) (!) 97.4 F (36.3 C)    TempSrc: Oral Oral    SpO2: 100% 98%  98%  Weight:   219 lb 8 oz (99.6 kg)   Height:        Intake/Output Summary (Last 24 hours) at 11/21/2017 0827 Last data filed at 11/21/2017 0532 Gross per 24 hour  Intake 1080 ml  Output 2550 ml  Net -1470 ml   Filed Weights   11/19/17 0444 11/20/17 0426 11/21/17 0535  Weight: 227 lb (103 kg) 224 lb 8 oz (101.8 kg) 219 lb 8 oz (99.6 kg)    Telemetry    SR - Personally Reviewed  ECG    n/a  Physical Exam   GEN: No acute distress.   Neck: elevated JVD Cardiac: RRR, no murmurs, rubs, or gallops.  Respiratory: Clear to auscultation bilaterally. GI: Soft, nontender, non-distended  MS: 1+ bilateral edema; No deformity. Neuro:  Nonfocal  Psych: Normal affect   Labs    Chemistry Recent Labs  Lab 11/18/17 1707 11/19/17 0608 11/20/17 0513 11/21/17 0505  NA 141 141 141 137  K 3.2* 3.5 3.8 4.5  CL 107 106 100* 101  CO2 22 25 27 25   GLUCOSE 92 96 100* 94  BUN 14 16 17 19   CREATININE 1.01* 1.12* 1.14* 1.11*  CALCIUM 9.1 9.2  9.4 9.9  PROT 7.1  --   --   --   ALBUMIN 3.4*  --   --   --   AST 26  --   --   --   ALT 14  --   --   --   ALKPHOS 246*  --   --   --   BILITOT 3.2*  --   --   --   GFRNONAA 57* 50* 49* 51*  GFRAA >60 58* 57* 59*  ANIONGAP 12 10 14 11      Hematology Recent Labs  Lab 11/18/17 1707  WBC 7.5  RBC 5.59*  HGB 14.6  HCT 47.1*  MCV 84.3  MCH 26.1  MCHC 31.0  RDW 19.4*  PLT 258    Cardiac Enzymes Recent Labs  Lab 11/18/17 1707 11/18/17 2232 11/19/17 0608  TROPONINI <0.03 <0.03 <0.03   No results for input(s): TROPIPOC in the last 168 hours.   BNP Recent Labs  Lab 11/18/17 1707  BNP 1,869.4*     DDimer No results for input(s): DDIMER in the last 168 hours.   Radiology  US Abdomen Limited  Result Date: 11/19/2017 CLINICAL DATA:  Ascites. EXAM: LIMITED ABDOMEN ULTRASOUND FOR ASCITES TECHNIQUE: Limited ultrasound survey for ascites was performed in all four abdominal quadrants. COMPARISON:  03/27/2017 . FINDINGS: Mild ascites noted.  No prominent fluid collection noted. IMPRESSION: Mild ascites. Electronically Signed   By: Maisie Fus  Register   On: 11/19/2017 13:54    Cardiac Studies    Patient Profile     [name] is a 67 y.o. female with a history of HTN, HLD, tobacco abuse, CAD s/panterior STEMI with early stent thrombosis for missing doses of Brilinta s/p PCI x 2 into LAD (04/2014) and chronic systolic HF.   Admitting with A/C systolic heart failure.    Assessment & Plan    1. Acute on chronic sysotlic HF - - 09/2017 echo LVEF 20-25%, restrictive diastolic function, RV dysfunction - repeat echo pending - negative 1.5 liters yesterday, negative 4.8 L this admit. On lasix IV 80mg  bid, fairly stable renal funciton - patient asked to hold beta blocker, plans to restart low dose back before discharge. . SHe remain on losartan and aldactone. \ - from notes compliance remains a big issue  - remains volume overloaded, mild to moderate diuresis  yesterday, Will increase lasix to tid dosing today. F/u repeat echo for today.   2. CAD - history of anterior MI, PCI x2 LAD 04/2014. History of stent thrombosis probably due to noncompliance. She is allergic to aspirin. Has been maintained on long term low dose brillina.   3. HTN - elevated this admit, losartan was increased to 50mg  yesterday  4. Abdominal distension - only mild ascites by 05/2014     For questions or updates, please contact [facility] Please consult [email or web address] for contact info under Cardiology/STEMI.      , MD  11/21/2017, 8:27 AM

## 2017-11-22 LAB — BASIC METABOLIC PANEL
Anion gap: 9 (ref 5–15)
BUN: 23 mg/dL — ABNORMAL HIGH (ref 6–20)
CO2: 29 mmol/L (ref 22–32)
Calcium: 10.1 mg/dL (ref 8.9–10.3)
Chloride: 101 mmol/L (ref 101–111)
Creatinine, Ser: 1.37 mg/dL — ABNORMAL HIGH (ref 0.44–1.00)
GFR calc Af Amer: 45 mL/min — ABNORMAL LOW (ref >60)
GFR calc non Af Amer: 39 mL/min — ABNORMAL LOW (ref >60)
Glucose, Bld: 97 mg/dL (ref 65–99)
Potassium: 4.9 mmol/L (ref 3.5–5.1)
Sodium: 139 mmol/L (ref 135–145)

## 2017-11-22 MED ORDER — ISOSORB DINITRATE-HYDRALAZINE 20-37.5 MG PO TABS
1.0000 | ORAL_TABLET | Freq: Two times a day (BID) | ORAL | Status: DC
  Administered 2017-11-22 – 2017-11-26 (×9): 1 via ORAL
  Filled 2017-11-22 (×9): qty 1

## 2017-11-22 MED ORDER — POTASSIUM CHLORIDE CRYS ER 20 MEQ PO TBCR
40.0000 meq | EXTENDED_RELEASE_TABLET | Freq: Every day | ORAL | Status: DC
  Administered 2017-11-23 – 2017-11-26 (×4): 40 meq via ORAL
  Filled 2017-11-22 (×4): qty 2

## 2017-11-22 NOTE — Progress Notes (Signed)
Paged ortho tech  Ortho tech stated unna boots are still out of stock  Will continue to monitor

## 2017-11-22 NOTE — Progress Notes (Signed)
Patient ID: Deborah Ho, female   DOB: Oct 25, 1951, 67 y.o.   MRN: [id number]     Advanced Heart Failure Rounding Note  PCP:  Primary Cardiologist: Dr Shirlee Latch    Subjective:    Admitted from HF clinic with marked volume overload. She is diuresing well with IV Lasix. Weight down another 8 lbs.   Abdominal US: Mild ascites.  Echo: EF 10-15%, moderate to severe RV systolic dysfunction.    Objective:   Weight Range: 211 lb 9.6 oz (96 kg) Body mass index is 35.21 kg/m.   Vital Signs:   Temp:  [97.5 F (36.4 C)-98.7 F (37.1 C)] 97.6 F (36.4 C) (01/07 0523) Pulse Rate:  [83] 83 (01/07 0523) Resp:  [18] 18 (01/07 0523) BP: (128-131)/(79-87) 131/87 (01/07 0523) SpO2:  [95 %-99 %] 99 % (01/07 0523) Weight:  [211 lb 9.6 oz (96 kg)] 211 lb 9.6 oz (96 kg) (01/07 0523) Last BM Date: 11/20/17  Weight change: Filed Weights   11/20/17 0426 11/21/17 0535 11/22/17 0523  Weight: 224 lb 8 oz (101.8 kg) 219 lb 8 oz (99.6 kg) 211 lb 9.6 oz (96 kg)    Intake/Output:   Intake/Output Summary (Last 24 hours) at 11/22/2017 0920 Last data filed at 11/22/2017 0524 Gross per 24 hour  Intake 720 ml  Output 4600 ml  Net -3880 ml      Physical Exam    General: NAD Neck: JVP 14-15 cm, no thyromegaly or thyroid nodule.  Lungs: Decreased breath sounds at bases. CV: Lateral PMI.  Heart regular S1/S2, no S3/S4, no murmur.  1+ chronic edema to knees bilaterally.   Abdomen: Soft, nontender, no hepatosplenomegaly, no distention.  Skin: Intact without lesions or rashes.  Neurologic: Alert and oriented x 3.  Psych: Normal affect. Extremities: No clubbing or cyanosis.  HEENT: Normal.   Telemetry   NSR 80s personally reviewed.   EKG   n/a  Labs    CBC No results for input(s): WBC, NEUTROABS, HGB, HCT, MCV, PLT in the last 72 hours. Basic Metabolic Panel Recent Labs    24/82/50 0505 11/22/17 0529  NA 137 139  K 4.5 4.9  CL 101 101  CO2 25 29  GLUCOSE 94 97  BUN 19 23*  CREATININE  1.11* 1.37*  CALCIUM 9.9 10.1  MG 2.0  --    Liver Function Tests No results for input(s): AST, ALT, ALKPHOS, BILITOT, PROT, ALBUMIN in the last 72 hours. No results for input(s): LIPASE, AMYLASE in the last 72 hours. Cardiac Enzymes No results for input(s): CKTOTAL, CKMB, CKMBINDEX, TROPONINI in the last 72 hours.  BNP: BNP (last 3 results) Recent Labs    03/26/17 1405 10/19/17 1426 11/18/17 1707  BNP 1,679.1* 1,172.7* 1,869.4*    ProBNP (last 3 results) No results for input(s): PROBNP in the last 8760 hours.   D-Dimer No results for input(s): DDIMER in the last 72 hours. Hemoglobin A1C No results for input(s): HGBA1C in the last 72 hours. Fasting Lipid Panel No results for input(s): CHOL, HDL, LDLCALC, TRIG, CHOLHDL, LDLDIRECT in the last 72 hours. Thyroid Function Tests No results for input(s): TSH, T4TOTAL, T3FREE, THYROIDAB in the last 72 hours.  Invalid input(s): FREET3  Other results:   Imaging    No results found.   Medications:     Scheduled Medications: . atorvastatin  40 mg Oral Daily  . enoxaparin (LOVENOX) injection  40 mg Subcutaneous Q24H  . ferrous sulfate  325 mg Oral TID  . furosemide  80 mg  Intravenous TID  . isosorbide-hydrALAZINE  1 tablet Oral BID  . loratadine  10 mg Oral Daily  . losartan  50 mg Oral Daily  . [START ON 11/23/2017] potassium chloride SA  40 mEq Oral Daily  . spironolactone  25 mg Oral Daily  . ticagrelor  60 mg Oral BID    Infusions: . sodium chloride      PRN Medications: sodium chloride, acetaminophen, benzonatate, magnesium hydroxide, ondansetron (ZOFRAN) IV    Patient Profile   Mrs. Harr is a 67 y.o. female with a history of HTN, HLD, tobacco abuse, CAD s/panterior STEMI with early stent thrombosis for missing doses of Brilinta s/p PCI x 2 into LAD (04/2014) and chronic systolic HF.   Admitting with A/C systolic heart failure.      Assessment/Plan   1. Acute/Chronic systolic CHF: Ischemic  cardiomyopathy.  ECHO 1/19 with EF 10-15, moderate to severely decreased RV systolic function. Has refused ICD. Marked volume overload despite increasing torsemide to 60 mg twice a day at home. I doubt she was taking medications at home, noncompliance has been a big issue. Weight coming down but still very volume overloaded.  - Continue Lasix 80 mg IV every 8 hrs.   - Continue spiro 25 mg daily.  - Hold beta blocker until volume status improved.  - Continue losartan 50 mg a day.  Refuses to rechallenge Entresto. - Start Bidil 1 tab bid (will to take bid).  - Place unna boots to help with edema.  2. CAD: s/p anterior MI, PCI x2 LAD 04/2014. History of stent thrombosis probably due to noncompliance.  She is allergic to aspirin. No chest pain.  - Continue Brilinta 60 mg twice a day and atorvastatin 40 mg daily  3. HTN: BP controlled.   4. CKD stage III: Creatinine 1.2-1.4. - Stable today.  5. Abdominal distention: Mild ascites only on [place].   Medication concerns reviewed with patient and pharmacy team. Barriers identified:   Ongoing medication compliance issues at home. Offered Paramedicine but she is not sure wants this free service. We discussed again today.   Length of Stay: 4  [name], MD  11/22/2017, 9:20 AM  Advanced Heart Failure Team Pager [phone] (M-F; 7a - 4p)  Please contact [facility] for night-coverage after hours (4p -7a ) and weekends on [email or web address]

## 2017-11-22 NOTE — Care Management Important Message (Signed)
Important Message  Patient Details  Name: Deborah Ho MRN: [id number] Date of Birth: 10-18-1951   Medicare Important Message Given:  Yes    Thanos Cousineau 11/22/2017, 1:46 PM

## 2017-11-22 NOTE — Progress Notes (Signed)
CARDIAC REHAB PHASE I   PRE:  Rate/Rhythm: 87 SR  BP:  Supine:   Sitting: 137/74  Standing:    SaO2: 98%RA  MODE:  Ambulation: 470 ft   POST:  Rate/Rhythm: 90  BP:  Supine:   Sitting: 148/80  Standing:    SaO2: 96%RA 0930-1003 Pt walked 470 ft on RA with steady gait. Tolerated well. Thighs not as tight today. Discussed CRP 2 and pt would like for referral to be made to [place]. Will refer. Pt stated insurance has not covered in past but she has Medicare now. Reinforced 2000 mg sodium restriction.   [name], RN BSN  11/22/2017 9:58 AM

## 2017-11-23 LAB — BASIC METABOLIC PANEL
Anion gap: 12 (ref 5–15)
BUN: 28 mg/dL — ABNORMAL HIGH (ref 6–20)
CO2: 30 mmol/L (ref 22–32)
Calcium: 10.1 mg/dL (ref 8.9–10.3)
Chloride: 97 mmol/L — ABNORMAL LOW (ref 101–111)
Creatinine, Ser: 1.46 mg/dL — ABNORMAL HIGH (ref 0.44–1.00)
GFR calc Af Amer: 42 mL/min — ABNORMAL LOW (ref >60)
GFR calc non Af Amer: 36 mL/min — ABNORMAL LOW (ref >60)
Glucose, Bld: 91 mg/dL (ref 65–99)
Potassium: 4 mmol/L (ref 3.5–5.1)
Sodium: 139 mmol/L (ref 135–145)

## 2017-11-23 NOTE — Progress Notes (Addendum)
Patient ID: Deborah Ho, female   DOB: Aug 06, 1951, 67 y.o.   MRN: [id number]     Advanced Heart Failure Rounding Note  Primary Cardiologist: Dr Shirlee Latch    Subjective:    She diuresed well again today.  Weight down another 6 lbs. Breathing improving.   Abdominal US: Mild ascites.  Echo: EF 10-15%, moderate to severe RV systolic dysfunction.   Objective:   Weight Range: 203 lb 12.8 oz (92.4 kg) Body mass index is 33.91 kg/m.   Vital Signs:   Temp:  [97.5 F (36.4 C)-98.4 F (36.9 C)] 97.5 F (36.4 C) (01/08 0559) Pulse Rate:  [81-84] 81 (01/08 0559) Resp:  [18-20] 18 (01/08 0559) BP: (116-130)/(57-71) 127/71 (01/08 0559) SpO2:  [97 %-98 %] 97 % (01/08 0559) Weight:  [203 lb 12.8 oz (92.4 kg)] 203 lb 12.8 oz (92.4 kg) (01/08 0559) Last BM Date: 11/21/17  Weight change: Filed Weights   11/21/17 0535 11/22/17 0523 11/23/17 0559  Weight: 219 lb 8 oz (99.6 kg) 211 lb 9.6 oz (96 kg) 203 lb 12.8 oz (92.4 kg)    Intake/Output:   Intake/Output Summary (Last 24 hours) at 11/23/2017 0818 Last data filed at 11/23/2017 0602 Gross per 24 hour  Intake 1255 ml  Output 4475 ml  Net -3220 ml      Physical Exam    General: NAD Neck: JVP 12 cm, no thyromegaly or thyroid nodule.  Lungs: Clear to auscultation bilaterally with normal respiratory effort. CV: Nondisplaced PMI.  Heart regular S1/S2, no S3/S4, no murmur.  1+ edema to knees.  Abdomen: Soft, nontender, no hepatosplenomegaly, no distention.  Skin: Intact without lesions or rashes.  Neurologic: Alert and oriented x 3.  Psych: Normal affect. Extremities: No clubbing or cyanosis.  HEENT: Normal.    Telemetry   NSR 80s, personally reviewed.   EKG    No new tracings.   Labs    CBC No results for input(s): WBC, NEUTROABS, HGB, HCT, MCV, PLT in the last 72 hours. Basic Metabolic Panel Recent Labs    18/84/16 0505 11/22/17 0529 11/23/17 0632  NA 137 139 139  K 4.5 4.9 4.0  CL 101 101 97*  CO2 25 29 30     GLUCOSE 94 97 91  BUN 19 23* 28*  CREATININE 1.11* 1.37* 1.46*  CALCIUM 9.9 10.1 10.1  MG 2.0  --   --    Liver Function Tests No results for input(s): AST, ALT, ALKPHOS, BILITOT, PROT, ALBUMIN in the last 72 hours. No results for input(s): LIPASE, AMYLASE in the last 72 hours. Cardiac Enzymes No results for input(s): CKTOTAL, CKMB, CKMBINDEX, TROPONINI in the last 72 hours.  BNP: BNP (last 3 results) Recent Labs    03/26/17 1405 10/19/17 1426 11/18/17 1707  BNP 1,679.1* 1,172.7* 1,869.4*    ProBNP (last 3 results) No results for input(s): PROBNP in the last 8760 hours.   D-Dimer No results for input(s): DDIMER in the last 72 hours. Hemoglobin A1C No results for input(s): HGBA1C in the last 72 hours. Fasting Lipid Panel No results for input(s): CHOL, HDL, LDLCALC, TRIG, CHOLHDL, LDLDIRECT in the last 72 hours. Thyroid Function Tests No results for input(s): TSH, T4TOTAL, T3FREE, THYROIDAB in the last 72 hours.  Invalid input(s): FREET3  Other results:   Imaging    No results found.   Medications:     Scheduled Medications: . atorvastatin  40 mg Oral Daily  . enoxaparin (LOVENOX) injection  40 mg Subcutaneous Q24H  . ferrous sulfate  325  mg Oral TID  . furosemide  80 mg Intravenous TID  . isosorbide-hydrALAZINE  1 tablet Oral BID  . loratadine  10 mg Oral Daily  . losartan  50 mg Oral Daily  . potassium chloride SA  40 mEq Oral Daily  . spironolactone  25 mg Oral Daily  . ticagrelor  60 mg Oral BID    Infusions: . sodium chloride      PRN Medications: sodium chloride, acetaminophen, benzonatate, magnesium hydroxide, ondansetron (ZOFRAN) IV    Patient Profile   Mrs. Ayotte is a 67 y.o. female with a history of HTN, HLD, tobacco abuse, CAD s/panterior STEMI with early stent thrombosis for missing doses of Brilinta s/p PCI x 2 into LAD (04/2014) and chronic systolic HF.   Admitting with A/C systolic heart failure.    Assessment/Plan    1. Acute/Chronic systolic CHF: Ischemic cardiomyopathy.  ECHO 1/19 with EF 10-15, moderate to severely decreased RV systolic function. Has refused ICD. Marked volume overload despite increasing torsemide to 60 mg twice a day at home. I doubt she was taking medications at home, noncompliance has been a big issue. Weight coming down and diuresing well but she remains volume overloaded on exam still.  Creatinine stable.  - Continue Lasix 80 mg IV every 8 hrs for another day.  - Continue spiro 25 mg daily.  - I think we can start her on Toprol XL 25 mg daily now, will titrate as able.  She did not tolerate Coreg well in the past.  - Continue losartan 50 mg a day.  Refuses to rechallenge Entresto. - Continue Bidil 1 tab BID. (Will not take TID)  - Start unna boots.  2. CAD: s/p anterior MI, PCI x2 LAD 04/2014. History of stent thrombosis probably due to noncompliance.  She is allergic to aspirin. No chest pain.  - Continue Brilinta 60 mg twice a day and atorvastatin 40 mg daily  3. HTN: BP controlled.   4. CKD stage III: Creatinine 1.38 today. Near baseline.  5. Abdominal distention: Mild ascites only on [place]. No indication for paracentesis.   Medication concerns reviewed with patient and pharmacy team. Barriers identified:   Ongoing medication compliance issues at home. Offered Paramedicine but she is not sure wants this free service. We continue to discuss.   Length of Stay: 5 [name] 11/24/2017 10:15 AM  Advanced Heart Failure Team Pager [phone] (M-F; 7a - 4p)  Please contact [facility] for night-coverage after hours (4p -7a ) and weekends on [email or web address]

## 2017-11-23 NOTE — Progress Notes (Signed)
Orthopedic Tech Progress Note Patient Details:  Deborah Ho February 13, 1951 [id number]  Ortho Devices Type of Ortho Device: Ace wrap, Unna boot Ortho Device/Splint Location: Bilateral unna boots Ortho Device/Splint Interventions: Application   Post Interventions Patient Tolerated: Well Instructions Provided: Care of device   Saul Fordyce 11/23/2017, 3:41 PM

## 2017-11-23 NOTE — Progress Notes (Signed)
CARDIAC REHAB PHASE I   PRE:  Rate/Rhythm: 85 SR  BP:  Supine:   Sitting: 124/89  Standing:    SaO2: 100%RA  MODE:  Ambulation: 470 ft   POST:  Rate/Rhythm: 90 SR  BP:  Supine:   Sitting: 132/92  Standing:    SaO2: 99%RA 1026-1048 Pt walked 470 ft with slow steady gait. No DOE noted. Tolerated well.   [name], RN BSN  11/23/2017 10:45 AM

## 2017-11-23 NOTE — Progress Notes (Signed)
Orthopedic Tech Progress Note Patient Details:  Deborah Ho 1951-09-14 [id number]  Ortho Devices Type of Ortho Device: Ace wrap, Unna boot Ortho Device/Splint Location: Bilateral unna boots Ortho Device/Splint Interventions: Application   Post Interventions Patient Tolerated: Well Instructions Provided: Care of device   Saul Fordyce 11/23/2017, 3:42 PM

## 2017-11-24 LAB — BASIC METABOLIC PANEL
Anion gap: 13 (ref 5–15)
BUN: 31 mg/dL — ABNORMAL HIGH (ref 6–20)
CO2: 27 mmol/L (ref 22–32)
Calcium: 10 mg/dL (ref 8.9–10.3)
Chloride: 99 mmol/L — ABNORMAL LOW (ref 101–111)
Creatinine, Ser: 1.38 mg/dL — ABNORMAL HIGH (ref 0.44–1.00)
GFR calc Af Amer: 45 mL/min — ABNORMAL LOW (ref >60)
GFR calc non Af Amer: 39 mL/min — ABNORMAL LOW (ref >60)
Glucose, Bld: 87 mg/dL (ref 65–99)
Potassium: 4 mmol/L (ref 3.5–5.1)
Sodium: 139 mmol/L (ref 135–145)

## 2017-11-24 MED ORDER — METOPROLOL SUCCINATE ER 25 MG PO TB24
25.0000 mg | ORAL_TABLET | Freq: Every day | ORAL | Status: DC
  Administered 2017-11-24: 25 mg via ORAL
  Filled 2017-11-24: qty 1

## 2017-11-24 NOTE — Progress Notes (Signed)
Patient refuses to have unna boots replaced.

## 2017-11-24 NOTE — Progress Notes (Signed)
1324-4010 Came to see pt to walk. She stated she has not felt well today as she stated her BP too low. BP 119/67, 80 SR, with sats at 97%RA. Pt stated this BP is low for her and she does not want to overexert right now. Encouraged her to walk with staff later. [name] RN BSN 11/24/2017 2:46 PM

## 2017-11-24 NOTE — Progress Notes (Signed)
Patient took off Radio broadcast assistant. Patient states they are on too tight and they hurt. Nurse assessed patient's legs. No signs of swelling, redness and heat. Pulses 2+ in both lower extremities. Patient has legs elevated. No complaints at this time. Will continue to monitor.

## 2017-11-24 NOTE — Progress Notes (Addendum)
I stopped in to see Deborah Ho.  We spoke about her progress while hospitalized.  She says she is feeling better.  I again reinforced the need for a low sodium diet at home as well as fluid restriction.  She tells me that she spoke to the dietician regarding meals at home.  She can teach back these topics--however has been able to in the past as well yet I question her compliance.  She remains resistant to HF [facility] home visits. She does not see value in being visitied by both [facility] and [employer].

## 2017-11-25 ENCOUNTER — Encounter (HOSPITAL_COMMUNITY): Payer: Self-pay

## 2017-11-25 LAB — CREATININE, SERUM
Creatinine, Ser: 1.53 mg/dL — ABNORMAL HIGH (ref 0.44–1.00)
GFR calc Af Amer: 40 mL/min — ABNORMAL LOW (ref >60)
GFR calc non Af Amer: 34 mL/min — ABNORMAL LOW (ref >60)

## 2017-11-25 LAB — BASIC METABOLIC PANEL
Anion gap: 13 (ref 5–15)
BUN: 38 mg/dL — ABNORMAL HIGH (ref 6–20)
CO2: 29 mmol/L (ref 22–32)
Calcium: 10 mg/dL (ref 8.9–10.3)
Chloride: 96 mmol/L — ABNORMAL LOW (ref 101–111)
Creatinine, Ser: 1.49 mg/dL — ABNORMAL HIGH (ref 0.44–1.00)
GFR calc Af Amer: 41 mL/min — ABNORMAL LOW (ref >60)
GFR calc non Af Amer: 35 mL/min — ABNORMAL LOW (ref >60)
Glucose, Bld: 113 mg/dL — ABNORMAL HIGH (ref 65–99)
Potassium: 4.2 mmol/L (ref 3.5–5.1)
Sodium: 138 mmol/L (ref 135–145)

## 2017-11-25 MED ORDER — METOLAZONE 2.5 MG PO TABS
2.5000 mg | ORAL_TABLET | Freq: Once | ORAL | Status: AC
  Administered 2017-11-25: 2.5 mg via ORAL
  Filled 2017-11-25: qty 1

## 2017-11-25 NOTE — Progress Notes (Signed)
Case Management Note  Patient Details  Name: Khaleah Duer MRN: [id number] Date of Birth: 06/21/1951  Subjective/Objective:     CHF              Action/Plan: Patient is well known to me from previous admission; PCP: [name]; has private insurance with Medicare; patient would benefit from a Disease Management Program for CHF; HHC choice offered, pt chose [facility]; [place] with [facility] called for arrangements. CM will continue to follow for progression of care.  11/25/2017- Case discussed in Length of Stay meeting; patient made HRI ( High Risk Initiative) for readmission with [facility]. Patient made aware and is in agreement. [place] with [facility] cancelled.   Expected Discharge Date:    possibly 11/23/2017                 Expected Discharge Plan:  Home w Home Health Services  Discharge planning Services  CM Consult  Choice offered to:  Patient HH Arranged:  RN [facility] Agency:  [facility]  Status of Service:  In process, will continue to follow  [name] [phone] 11/19/2017, 2:41 PM

## 2017-11-25 NOTE — Progress Notes (Signed)
Patient has refused to wear TED hose today due to leg pain when wearing them.

## 2017-11-25 NOTE — Progress Notes (Signed)
Patient ID: Deborah Ho, female   DOB: 11-Mar-1951, 67 y.o.   MRN: [id number]     Advanced Heart Failure Rounding Note  Primary Cardiologist: Dr Shirlee Latch    Subjective:    Feeling OK this am. States her BP is too low at 100-110s. Feels more lightheadedness and fatigued.  Refuses unna boots but willing to try ted hose.   Negative another 990 cc and down 3 more lbs.  Abdominal US: Mild ascites.  Echo: EF 10-15%, moderate to severe RV systolic dysfunction.   Objective:   Weight Range: 194 lb 8 oz (88.2 kg) Body mass index is 32.37 kg/m.   Vital Signs:   Temp:  [97.8 F (36.6 C)-98.1 F (36.7 C)] 98.1 F (36.7 C) (01/10 0502) Pulse Rate:  [82-106] 82 (01/10 0502) Resp:  [18-20] 20 (01/10 0502) BP: (107-112)/(62-72) 107/64 (01/10 0502) SpO2:  [95 %-96 %] 95 % (01/10 0502) Weight:  [194 lb 8 oz (88.2 kg)] 194 lb 8 oz (88.2 kg) (01/10 0502) Last BM Date: 11/24/17  Weight change: Filed Weights   11/23/17 0559 11/24/17 0643 11/25/17 0502  Weight: 203 lb 12.8 oz (92.4 kg) 197 lb 8 oz (89.6 kg) 194 lb 8 oz (88.2 kg)    Intake/Output:   Intake/Output Summary (Last 24 hours) at 11/25/2017 0750 Last data filed at 11/25/2017 0748 Gross per 24 hour  Intake 960 ml  Output 2150 ml  Net -1190 ml     Physical Exam    General: NAD  HEENT: Normal Neck: Supple. JVP ~10 cm at least.. Carotids 2+ bilat; no bruits. No thyromegaly or nodule noted. Cor: PMI nondisplaced. RRR, No M/G/R noted Lungs: CTAB, normal effort. Abdomen: Soft, non-tender, non-distended, no HSM. No bruits or masses. +BS  Extremities: No cyanosis, clubbing, or rash. 1+ edema into thigs Neuro: Alert & orientedx3, cranial nerves grossly intact. moves all 4 extremities w/o difficulty. Affect pleasant   Telemetry   NSR 80s, personally reviewed.   EKG    No new tracings.   Labs    CBC No results for input(s): WBC, NEUTROABS, HGB, HCT, MCV, PLT in the last 72 hours. Basic Metabolic Panel Recent Labs   11/23/17 0632 11/24/17 0448  NA 139 139  K 4.0 4.0  CL 97* 99*  CO2 30 27  GLUCOSE 91 87  BUN 28* 31*  CREATININE 1.46* 1.38*  CALCIUM 10.1 10.0   Liver Function Tests No results for input(s): AST, ALT, ALKPHOS, BILITOT, PROT, ALBUMIN in the last 72 hours. No results for input(s): LIPASE, AMYLASE in the last 72 hours. Cardiac Enzymes No results for input(s): CKTOTAL, CKMB, CKMBINDEX, TROPONINI in the last 72 hours.  BNP: BNP (last 3 results) Recent Labs    03/26/17 1405 10/19/17 1426 11/18/17 1707  BNP 1,679.1* 1,172.7* 1,869.4*    ProBNP (last 3 results) No results for input(s): PROBNP in the last 8760 hours.   D-Dimer No results for input(s): DDIMER in the last 72 hours. Hemoglobin A1C No results for input(s): HGBA1C in the last 72 hours. Fasting Lipid Panel No results for input(s): CHOL, HDL, LDLCALC, TRIG, CHOLHDL, LDLDIRECT in the last 72 hours. Thyroid Function Tests No results for input(s): TSH, T4TOTAL, T3FREE, THYROIDAB in the last 72 hours.  Invalid input(s): FREET3  Other results:   Imaging    No results found.   Medications:     Scheduled Medications: . atorvastatin  40 mg Oral Daily  . enoxaparin (LOVENOX) injection  40 mg Subcutaneous Q24H  . ferrous sulfate  325  mg Oral TID  . furosemide  80 mg Intravenous TID  . isosorbide-hydrALAZINE  1 tablet Oral BID  . loratadine  10 mg Oral Daily  . losartan  50 mg Oral Daily  . metoprolol succinate  25 mg Oral Daily  . potassium chloride SA  40 mEq Oral Daily  . spironolactone  25 mg Oral Daily  . ticagrelor  60 mg Oral BID    Infusions: . sodium chloride      PRN Medications: sodium chloride, acetaminophen, benzonatate, magnesium hydroxide, ondansetron (ZOFRAN) IV    Patient Profile   Mrs. Racanelli is a 67 y.o. female with a history of HTN, HLD, tobacco abuse, CAD s/panterior STEMI with early stent thrombosis for missing doses of Brilinta s/p PCI x 2 into LAD (04/2014) and chronic  systolic HF.   Admitting with A/C systolic heart failure.    Assessment/Plan   1. Acute/Chronic systolic CHF: Ischemic cardiomyopathy.  ECHO 1/19 with EF 10-15, moderate to severely decreased RV systolic function. Has refused ICD.  - Volume status continues to improve. ? Compliance with home torsemide.  - Continue Lasix 80 mg IV every 8 hrs for another day. Give dose of metolazone this am. BMET pending.  - Continue spiro 25 mg daily.  - Refusing Toprol. States it makes her BP too low.  - Continue losartan 50 mg a day.  Refuses to rechallenge Entresto. - Continue Bidil 1 tab BID. (Will not take TID)  - Refuses unna boots.  2. CAD: s/p anterior MI, PCI x2 LAD 04/2014. History of stent thrombosis probably due to noncompliance.  She is allergic to aspirin.  - No s/s of ischemia.    - Continue Brilinta 60 mg twice a day and atorvastatin 40 mg daily  3. HTN:  - Controlled on current meds.  4. CKD stage III:  - BMET pending.  5. Abdominal distention:  - Mild ascites only on [place]. No indication for paracentesis. No change.  Medication concerns reviewed with patient and pharmacy team. Barriers identified:   Ongoing medication compliance issues at home. Offered Paramedicine but she is not sure wants this free service. We continue to discuss. No change.   Length of Stay: [address] Graciella Freer, [place]  11/25/2017 7:50 AM  Advanced Heart Failure Team Pager [phone] (M-F; 7a - 4p)  Please contact [facility] for night-coverage after hours (4p -7a ) and weekends on [email or web address]  Patient seen with PA, agree with the above note.  She continues to diurese well, weight falling.  She again refuses to take a beta blocker.  Would continue current regimen one more day, possibly to po tomorrow.   [name] 11/25/2017 1:42 PM

## 2017-11-25 NOTE — Discharge Summary (Signed)
Advanced Heart Failure Team  Discharge Summary   Patient ID: Deborah Ho MRN: [id number], DOB/AGE: November 03, 1951 67 y.o. Admit date: 11/18/2017 D/C date:     11/26/2017   Primary Discharge Diagnoses:  1. Acute/Chronic systolic CHF: Ischemic cardiomyopathy.  ECHO 1/19 with EF 10-15, moderate to severely decreased RV systolic function.  2. CAD: s/p anterior MI, PCI x2 LAD 04/2014. History of stent thrombosis probably due to noncompliance.   3. HTN 4. CKD stage III 5. Abdominal distention  Hospital Course:  Deborah Ho is a 67 y.o. female with a history of HTN, HLD, tobacco abuse, CAD s/panterior STEMI with early stent thrombosis for missing doses of Brilinta s/p PCI x 2 into LAD (04/2014) and chronic systolic HF.   Admitted form HF clinic with A/C systolic heart failure in the setting of medication and diet noncompliance. Diuresed with IV lasix + metolazone and transitioned to torsemide 80 mg twice a day. HF meds titrated as tolerated, but pt refused aggressive titration or addition of beta blocker. Overall diuresed 16.1 L and down 36 lbs from highest weight this admission.   Pt has abdominal US with concerns for ascites, but only mild fluid noted. No paracentesis pursued.  Examined am of 11/26/17 and thought stable for discharge with med adjustments and close follow up as below.  We will follow up weekly in the HF clinic x at least 1 month to foster compliance.   Medication concerns reviewed with patient and pharmacy team. Barriers identified: Compliance. Bidil.  Have messaged Case Management for discount card if available, and have messaged Pharm-D to inquire into patient assistance.   Discharge Weight Range: 228 lbs -> 192 lbs Discharge Vitals: Blood pressure 125/68, pulse 84, temperature 97.6 F (36.4 C), temperature source Oral, resp. rate 20, height 5\' 5"  (1.651 m), weight 192 lb 1.6 oz (87.1 kg), SpO2 100 %.  Labs: Lab Results  Component Value Date   WBC 7.5 11/18/2017    HGB 14.6 11/18/2017   HCT 47.1 (H) 11/18/2017   MCV 84.3 11/18/2017   PLT 258 11/18/2017    Recent Labs  Lab 11/26/17 0439  NA 138  K 4.7  CL 96*  CO2 28  BUN 41*  CREATININE 1.57*  CALCIUM 10.2  GLUCOSE 102*   Lab Results  Component Value Date   CHOL 129 09/29/2017   HDL 34 (L) 09/29/2017   LDLCALC 82 09/29/2017   TRIG 64 09/29/2017   BNP (last 3 results) Recent Labs    03/26/17 1405 10/19/17 1426 11/18/17 1707  BNP 1,679.1* 1,172.7* 1,869.4*    ProBNP (last 3 results) No results for input(s): PROBNP in the last 8760 hours.   Diagnostic Studies/Procedures   No results found.  Discharge Medications   Allergies as of 11/26/2017      Reactions   Aspirin Swelling   Chewable children's aspirin makes patients tongue and face swell   Effient [prasugrel] Swelling   Patient's tongue and face swells   Entresto [sacubitril-valsartan] Other (See Comments)   dizziness   Lactose Intolerance (gi) Other (See Comments)   REACTION: stomach upset   Robitussin Dm [guaifenesin-dm] Swelling   Patient's tongue swells   Sulfa Antibiotics Swelling   Other    Had to replace "catgut" with clamps   Wheat Bran Other (See Comments)   REACTION: unknown      Medication List    TAKE these medications   atorvastatin 80 MG tablet Commonly known as:  LIPITOR Take 40 mg by mouth daily at 6 PM.  cetirizine 10 MG tablet Commonly known as:  ZYRTEC Take 10 mg daily by mouth.   cholecalciferol 1000 units tablet Commonly known as:  VITAMIN D Take 1,000 Units by mouth. Twice a month   ferrous sulfate 325 (65 FE) MG tablet Take 325 mg 3 (three) times daily by mouth. Monday,Wednesday, friday   isosorbide-hydrALAZINE 20-37.5 MG tablet Commonly known as:  BIDIL Take 1 tablet by mouth 2 (two) times daily.   losartan 50 MG tablet Commonly known as:  COZAAR Take 1 tablet (50 mg total) by mouth daily.   magnesium oxide 400 (241.3 Mg) MG tablet Commonly known as:  MAG-OX Take  400 mg by mouth. Twice a month   nitroGLYCERIN 0.4 MG SL tablet Commonly known as:  NITROSTAT Place 0.4 mg under the tongue every 5 (five) minutes as needed for chest pain.   potassium chloride SA 20 MEQ tablet Commonly known as:  K-DUR,KLOR-CON Take 2 tablets (40 mEq total) by mouth daily.   spironolactone 25 MG tablet Commonly known as:  ALDACTONE Take 1 tablet (25 mg total) by mouth daily.   ticagrelor 60 MG Tabs tablet Commonly known as:  BRILINTA Take 1 tablet (60 mg total) by mouth 2 (two) times daily.   torsemide 20 MG tablet Commonly known as:  DEMADEX Take 4 tablets (80 mg total) by mouth 2 (two) times daily. What changed:  how much to take   traMADol 50 MG tablet Commonly known as:  ULTRAM Take 1-2 tablets (50-100 mg total) by mouth every 12 (twelve) hours as needed for moderate pain.            Durable Medical Equipment  (From admission, onward)        Start     Ordered   11/26/17 0804  Heart failure home health orders  (Heart failure home health orders / Face to face)  Once    Comments:  Heart Failure Follow-up Care:  Verify follow-up appointments per Patient Discharge Instructions. Confirm transportation arranged. Reconcile home medications with discharge medication list. Remove discontinued medications from use. Assist patient/caregiver to manage medications using pill box. Reinforce low sodium food selection Assessments: Vital signs and oxygen saturation at each visit. Assess home environment for safety concerns, caregiver support and availability of low-sodium foods. Consult Child psychotherapist, PT/OT, Dietitian, and CNA based on assessments. Perform comprehensive cardiopulmonary assessment. Notify MD for any change in condition or weight gain of 3 pounds in one day or 5 pounds in one week with symptoms. Daily Weights and Symptom Monitoring: Ensure patient has access to scales. Teach patient/caregiver to weigh daily before breakfast and after voiding using  same scale and record.    Teach patient/caregiver to track weight and symptoms and when to notify Provider. Activity: Develop individualized activity plan with patient/caregiver.  Resume previous HH services.  Question Answer Comment  Heart Failure Follow-up Care [facility] at [phone]   Obtain the following labs Other see comments   Lab frequency Other see comments   Fax lab results to AHF Clinic at [phone]   Diet Low Sodium Heart Healthy   Fluid restrictions: 2000 mL Fluid   Initiate Heart Failure Clinic Diuretic Protocol to be used by [facility] only ( to be ordered by Heart Failure Team Providers Only) Yes      11/26/17 0804      Disposition   The patient will be discharged in stable condition to home. Discharge Instructions    (HEART FAILURE PATIENTS) Call MD:  Anytime you have any of the following symptoms: 1) 3 pound weight gain in 24 hours or 5 pounds in 1 week 2) shortness of breath, with or without a dry hacking cough 3) swelling in the hands, feet or stomach 4) if you have to sleep on extra pillows at night in order to breathe.   Complete by:  As directed    Amb Referral to Cardiac Rehabilitation   Complete by:  As directed    Diagnosis:  Heart Failure (see criteria below if ordering Phase II)   Heart Failure Type:  Chronic Systolic   Diet - low sodium heart healthy   Complete by:  As directed    Heart Failure patients record your daily weight using the same scale at the same time of day   Complete by:  As directed    Increase activity slowly   Complete by:  As directed      Follow-up Information    [facility] - Dme Follow up.   Why:  They will do your home health care at your home Contact information: [address] [place] [address] [phone]        [facility]. Go on 12/07/2017.   Specialty:  Cardiology Why:  11:30 AM, Advanced [facility]  [facility] Clinic, parking code [id number] Contact information: [address] [id number] mc [place] [address] [phone]            Duration of Discharge Encounter: Greater than 35 minutes   Signed, Graciella Freer, PA-C  11/26/2017, 8:16 AM

## 2017-11-26 LAB — BASIC METABOLIC PANEL
Anion gap: 14 (ref 5–15)
BUN: 41 mg/dL — ABNORMAL HIGH (ref 6–20)
CO2: 28 mmol/L (ref 22–32)
Calcium: 10.2 mg/dL (ref 8.9–10.3)
Chloride: 96 mmol/L — ABNORMAL LOW (ref 101–111)
Creatinine, Ser: 1.57 mg/dL — ABNORMAL HIGH (ref 0.44–1.00)
GFR calc Af Amer: 39 mL/min — ABNORMAL LOW (ref >60)
GFR calc non Af Amer: 33 mL/min — ABNORMAL LOW (ref >60)
Glucose, Bld: 102 mg/dL — ABNORMAL HIGH (ref 65–99)
Potassium: 4.7 mmol/L (ref 3.5–5.1)
Sodium: 138 mmol/L (ref 135–145)

## 2017-11-26 MED ORDER — TORSEMIDE 20 MG PO TABS: 80.0000 mg | ORAL_TABLET | Freq: Two times a day (BID) | ORAL | 6 refills | Status: DC

## 2017-11-26 MED ORDER — LOSARTAN POTASSIUM 50 MG PO TABS: 50.0000 mg | ORAL_TABLET | Freq: Every day | ORAL | 6 refills | Status: DC

## 2017-11-26 MED ORDER — TORSEMIDE 20 MG PO TABS
80.0000 mg | ORAL_TABLET | Freq: Two times a day (BID) | ORAL | Status: DC
  Administered 2017-11-26: 80 mg via ORAL
  Filled 2017-11-26: qty 4

## 2017-11-26 MED ORDER — ISOSORB DINITRATE-HYDRALAZINE 20-37.5 MG PO TABS: 1.0000 | ORAL_TABLET | Freq: Two times a day (BID) | ORAL | 3 refills | Status: DC

## 2017-11-26 MED ORDER — TRAMADOL HCL 50 MG PO TABS: 50.0000 mg | ORAL_TABLET | Freq: Two times a day (BID) | ORAL | 0 refills | Status: DC | PRN

## 2017-11-26 MED ORDER — HYDROCODONE-ACETAMINOPHEN 5-325 MG PO TABS
1.0000 | ORAL_TABLET | Freq: Once | ORAL | Status: AC | PRN
  Administered 2017-11-26: 1 via ORAL
  Filled 2017-11-26: qty 1

## 2017-11-26 NOTE — Progress Notes (Addendum)
Pt with 9/10 throbbing bilateral leg pain. Legs are mildly swollen and reddened. Pt has been refusing Unna boots and TED hose due to leg pain, but has refused pain meds and staff assisted ambulation. Pt prefers to sit in the chair and ambulate alone in her room. Pt has stated that all previous diagnostics of her legs have been negative for blood clots. MD paged for pain medication as she has refused her PRN Tylenol. Orders received. Will continue to monitor.   Pt states that she has this issue about every 3-4 weeks at home, requiring her to call her Internal Medicine PCP. She reports that rheumatiod arthritis has been named as a possible cause.

## 2017-11-26 NOTE — Progress Notes (Signed)
Patient ID: Deborah Ho, female   DOB: [birth year], 67 y.o.   MRN: [id number]     Advanced Heart Failure Rounding Note  Primary Cardiologist: Dr Shirlee Latch    Subjective:    Feeling much better this am. Denies SOB. Had some bilateral leg pain overnight, but resolved with vicodin. States she has RA flare every 3-4 weeks.   Negative 640 cc and down another 2 lbs with metolazone yesterday.   Abdominal US: Mild ascites.  Echo: EF 10-15%, moderate to severe RV systolic dysfunction.   Objective:   Weight Range: 192 lb 1.6 oz (87.1 kg) Body mass index is 31.97 kg/m.   Vital Signs:   Temp:  [97.6 F (36.4 C)-98.4 F (36.9 C)] 97.6 F (36.4 C) (01/11 0526) Pulse Rate:  [84-91] 84 (01/11 0526) Resp:  [18-20] 20 (01/11 0526) BP: (112-125)/(50-86) 125/68 (01/11 0526) SpO2:  [95 %-100 %] 100 % (01/11 0526) Weight:  [192 lb 1.6 oz (87.1 kg)] 192 lb 1.6 oz (87.1 kg) (01/11 0526) Last BM Date: 11/24/17  Weight change: Filed Weights   11/24/17 0643 11/25/17 0502 11/26/17 0526  Weight: 197 lb 8 oz (89.6 kg) 194 lb 8 oz (88.2 kg) 192 lb 1.6 oz (87.1 kg)    Intake/Output:   Intake/Output Summary (Last 24 hours) at 11/26/2017 0752 Last data filed at 11/26/2017 0526 Gross per 24 hour  Intake 1560 ml  Output 2000 ml  Net -440 ml     Physical Exam    General: NAD  HEENT: Normal  Neck: Supple. JVP ~7-8 cm. Carotids 2+ bilat; no bruits. No thyromegaly or nodule noted. Cor: PMI nondisplaced. RRR, No M/G/R noted Lungs: CTAB, normal effort. Abdomen: Soft, non-tender, non-distended, no HSM. No bruits or masses. +BS  Extremities: No cyanosis, clubbing, or rash. Chronic 1+ edema in thighs. Chronic 1-2+ BLE edema. Refuses unna boots or ted hose.  Neuro: Alert & orientedx3, cranial nerves grossly intact. moves all 4 extremities w/o difficulty. Affect flat but appropriate.   Telemetry   NSR 80s, personally reviewed.   EKG    No new tracings.    Labs    CBC No results for input(s):  WBC, NEUTROABS, HGB, HCT, MCV, PLT in the last 72 hours. Basic Metabolic Panel Recent Labs    56/38/75 0903 11/26/17 0439  NA 138 138  K 4.2 4.7  CL 96* 96*  CO2 29 28  GLUCOSE 113* 102*  BUN 38* 41*  CREATININE 1.49* 1.57*  CALCIUM 10.0 10.2   Liver Function Tests No results for input(s): AST, ALT, ALKPHOS, BILITOT, PROT, ALBUMIN in the last 72 hours. No results for input(s): LIPASE, AMYLASE in the last 72 hours. Cardiac Enzymes No results for input(s): CKTOTAL, CKMB, CKMBINDEX, TROPONINI in the last 72 hours.  BNP: BNP (last 3 results) Recent Labs    03/26/17 1405 10/19/17 1426 11/18/17 1707  BNP 1,679.1* 1,172.7* 1,869.4*    ProBNP (last 3 results) No results for input(s): PROBNP in the last 8760 hours.   D-Dimer No results for input(s): DDIMER in the last 72 hours. Hemoglobin A1C No results for input(s): HGBA1C in the last 72 hours. Fasting Lipid Panel No results for input(s): CHOL, HDL, LDLCALC, TRIG, CHOLHDL, LDLDIRECT in the last 72 hours. Thyroid Function Tests No results for input(s): TSH, T4TOTAL, T3FREE, THYROIDAB in the last 72 hours.  Invalid input(s): FREET3  Other results:   Imaging    No results found.   Medications:     Scheduled Medications: . atorvastatin  40 mg Oral  Daily  . enoxaparin (LOVENOX) injection  40 mg Subcutaneous Q24H  . ferrous sulfate  325 mg Oral TID  . furosemide  80 mg Intravenous TID  . isosorbide-hydrALAZINE  1 tablet Oral BID  . loratadine  10 mg Oral Daily  . losartan  50 mg Oral Daily  . potassium chloride SA  40 mEq Oral Daily  . spironolactone  25 mg Oral Daily  . ticagrelor  60 mg Oral BID    Infusions: . sodium chloride      PRN Medications: sodium chloride, acetaminophen, benzonatate, magnesium hydroxide, ondansetron (ZOFRAN) IV    Patient Profile   Deborah Ho is a 67 y.o. female with a history of HTN, HLD, tobacco abuse, CAD s/panterior STEMI with early stent thrombosis for missing  doses of Brilinta s/p PCI x 2 into LAD (04/2014) and chronic systolic HF.   Admitting with A/C systolic heart failure.    Assessment/Plan   1. Acute/Chronic systolic CHF: Ischemic cardiomyopathy.  ECHO 1/19 with EF 10-15, moderate to severely decreased RV systolic function. Has refused ICD.  - Volume status improved.  She remains slightly volume overloaded but refuses to stay any longer. Think we can manage with increasing her torsemide at home  - STOP IV lasix. Increase torsemide to 80 mg BID for home.  - Continue spiro 25 mg daily.  - Refusing Toprol. States it makes her BP too low.  - Continue losartan 50 mg a day.  Refuses to rechallenge Entresto. - Continue Bidil 1 tab BID. (Will not take TID)  - Refuses unna boots.  - Reinforced fluid restriction to < 2 L daily, sodium restriction to less than 2000 mg daily, and the importance of daily weights.   2. CAD: s/p anterior MI, PCI x2 LAD 04/2014. History of stent thrombosis probably due to noncompliance.  She is allergic to aspirin.  - No s/s of ischemia.    - Continue Brilinta 60 mg twice a day and atorvastatin 40 mg daily  3. HTN:  - Stable on current meds, and refuses any up-titration.  4. CKD stage III:  - Relatively stable with diuresis.  5. Abdominal distention:  - Mild ascites only on [place]. No indication for paracentesis. No change.   Home today on increased torsemide.  Close follow up made, then to proceed with Weekly follow up x 1 month to follow for compliance.   Medication concerns reviewed with patient and pharmacy team. Barriers identified:   Ongoing medication compliance issues at home. Offered Paramedicine but she is not sure wants this free service. We continue to discuss. No change.   Length of Stay: [address] [name]  11/26/2017 7:52 AM  Advanced Heart Failure Team Pager [phone] (M-F; 7a - 4p)  Please contact [facility] for night-coverage after hours (4p -7a ) and weekends on  [email or web address]  Patient seen with PA, agree with the above note.  She has diuresed well, down 36 lbs.  At this point, her future course will depend on her ability to followup with medical care and willingness to take her medications.  We have emphasized to her the importance to follow her treatment plan.    She will go home on losartan 50 mg daily, torsemide 80 bid, atorvastatin 40 daily, Bidil 1 tab bid (refuses tid), KCl 40 daily, spironolactone 25 daily, ticagrelor 60 bid.  She refuses beta blocker.    We urged her to accept paramedicine program.   [name] 11/26/2017 8:34 AM

## 2017-11-30 ENCOUNTER — Telehealth (HOSPITAL_COMMUNITY): Payer: Self-pay

## 2017-11-30 NOTE — Telephone Encounter (Signed)
Pt did not answer the phone when I called and I left a message on her cell phone to return my call.   Kerry Hough, EMT-Paramedic 11/30/17

## 2017-12-03 ENCOUNTER — Telehealth (HOSPITAL_COMMUNITY): Payer: Self-pay

## 2017-12-03 ENCOUNTER — Telehealth (HOSPITAL_COMMUNITY): Payer: Self-pay | Admitting: Pharmacist

## 2017-12-03 NOTE — Telephone Encounter (Signed)
Patients insurance is active and benefits verified through Medicare Part A & B - No co-pay, deductible amount of $185.00/$2.37 has been met, no out of pocket, 20% co-insurance, and no pre-authorization is required. Passport/reference [phone]  Patient will be contacted and scheduled after their follow up appt with the Cardiologist office upon review by the RN Navigator.

## 2017-12-03 NOTE — Telephone Encounter (Signed)
Bidil PA approved by [employer] Part [employer] through 11/15/18.   [name]. Bonnye Fava, PharmD, BCPS, CPP Clinical Pharmacist Phone: [phone] 12/03/2017 10:08 AM

## 2017-12-07 ENCOUNTER — Encounter (HOSPITAL_COMMUNITY): Payer: Self-pay

## 2017-12-14 ENCOUNTER — Encounter (HOSPITAL_COMMUNITY): Payer: Self-pay

## 2017-12-15 ENCOUNTER — Telehealth (HOSPITAL_COMMUNITY): Payer: Self-pay | Admitting: Surgery

## 2017-12-15 NOTE — Telephone Encounter (Signed)
Deborah Ho has not answered phone calls from the HF Community Paramedic and has in the past refused to take part in the program or allow home visits.  For the above stated reasons we will discharge her from the HF Community Paramedicine Program at this time.

## 2017-12-16 ENCOUNTER — Encounter (HOSPITAL_COMMUNITY): Payer: Self-pay

## 2017-12-21 ENCOUNTER — Encounter (HOSPITAL_COMMUNITY): Payer: Self-pay

## 2017-12-28 ENCOUNTER — Encounter (HOSPITAL_COMMUNITY): Payer: Self-pay | Admitting: Cardiology

## 2018-01-04 ENCOUNTER — Encounter (HOSPITAL_COMMUNITY): Payer: Self-pay

## 2018-03-10 ENCOUNTER — Encounter (HOSPITAL_COMMUNITY): Payer: Self-pay

## 2018-03-10 ENCOUNTER — Other Ambulatory Visit: Payer: Self-pay

## 2018-03-10 ENCOUNTER — Emergency Department (HOSPITAL_COMMUNITY): Payer: Medicare Other

## 2018-03-10 ENCOUNTER — Inpatient Hospital Stay (HOSPITAL_COMMUNITY)
Admission: EM | Admit: 2018-03-10 | Discharge: 2018-03-18 | DRG: 291 | Disposition: A | Discharge status: Home Health | Payer: Medicare Other | Attending: Internal Medicine | Admitting: Internal Medicine

## 2018-03-10 DIAGNOSIS — Z886 Allergy status to analgesic agent status: Secondary | ICD-10-CM

## 2018-03-10 DIAGNOSIS — H9202 Otalgia, left ear: Secondary | ICD-10-CM | POA: Diagnosis present

## 2018-03-10 DIAGNOSIS — E785 Hyperlipidemia, unspecified: Secondary | ICD-10-CM | POA: Diagnosis present

## 2018-03-10 DIAGNOSIS — Z79899 Other long term (current) drug therapy: Secondary | ICD-10-CM

## 2018-03-10 DIAGNOSIS — K219 Gastro-esophageal reflux disease without esophagitis: Secondary | ICD-10-CM | POA: Diagnosis present

## 2018-03-10 DIAGNOSIS — Z888 Allergy status to other drugs, medicaments and biological substances status: Secondary | ICD-10-CM

## 2018-03-10 DIAGNOSIS — Z882 Allergy status to sulfonamides status: Secondary | ICD-10-CM

## 2018-03-10 DIAGNOSIS — I252 Old myocardial infarction: Secondary | ICD-10-CM

## 2018-03-10 DIAGNOSIS — Z955 Presence of coronary angioplasty implant and graft: Secondary | ICD-10-CM

## 2018-03-10 DIAGNOSIS — I5033 Acute on chronic diastolic (congestive) heart failure: Secondary | ICD-10-CM | POA: Diagnosis not present

## 2018-03-10 DIAGNOSIS — N183 Chronic kidney disease, stage 3 (moderate): Secondary | ICD-10-CM | POA: Diagnosis present

## 2018-03-10 DIAGNOSIS — I5043 Acute on chronic combined systolic (congestive) and diastolic (congestive) heart failure: Secondary | ICD-10-CM | POA: Diagnosis present

## 2018-03-10 DIAGNOSIS — I130 Hypertensive heart and chronic kidney disease with heart failure and stage 1 through stage 4 chronic kidney disease, or unspecified chronic kidney disease: Principal | ICD-10-CM | POA: Diagnosis present

## 2018-03-10 DIAGNOSIS — I2510 Atherosclerotic heart disease of native coronary artery without angina pectoris: Secondary | ICD-10-CM | POA: Diagnosis present

## 2018-03-10 DIAGNOSIS — Z8249 Family history of ischemic heart disease and other diseases of the circulatory system: Secondary | ICD-10-CM

## 2018-03-10 DIAGNOSIS — E739 Lactose intolerance, unspecified: Secondary | ICD-10-CM | POA: Diagnosis present

## 2018-03-10 DIAGNOSIS — Z9114 Patient's other noncompliance with medication regimen: Secondary | ICD-10-CM

## 2018-03-10 LAB — CBC
HCT: 45.7 % (ref 36.0–46.0)
Hemoglobin: 14.6 g/dL (ref 12.0–15.0)
MCH: 28.3 pg (ref 26.0–34.0)
MCHC: 31.9 g/dL (ref 30.0–36.0)
MCV: 88.7 fL (ref 78.0–100.0)
Platelets: 247 10*3/uL (ref 150–400)
RBC: 5.15 MIL/uL — ABNORMAL HIGH (ref 3.87–5.11)
RDW: 17 % — ABNORMAL HIGH (ref 11.5–15.5)
WBC: 6.1 10*3/uL (ref 4.0–10.5)

## 2018-03-10 LAB — COMPREHENSIVE METABOLIC PANEL
ALT: 13 U/L — ABNORMAL LOW (ref 14–54)
AST: 24 U/L (ref 15–41)
Albumin: 3.2 g/dL — ABNORMAL LOW (ref 3.5–5.0)
Alkaline Phosphatase: 235 U/L — ABNORMAL HIGH (ref 38–126)
Anion gap: 11 (ref 5–15)
BUN: 15 mg/dL (ref 6–20)
CO2: 24 mmol/L (ref 22–32)
Calcium: 9.3 mg/dL (ref 8.9–10.3)
Chloride: 107 mmol/L (ref 101–111)
Creatinine, Ser: 1.18 mg/dL — ABNORMAL HIGH (ref 0.44–1.00)
GFR calc Af Amer: 54 mL/min — ABNORMAL LOW (ref >60)
GFR calc non Af Amer: 47 mL/min — ABNORMAL LOW (ref >60)
Glucose, Bld: 100 mg/dL — ABNORMAL HIGH (ref 65–99)
Potassium: 3.1 mmol/L — ABNORMAL LOW (ref 3.5–5.1)
Sodium: 142 mmol/L (ref 135–145)
Total Bilirubin: 4 mg/dL — ABNORMAL HIGH (ref 0.3–1.2)
Total Protein: 7 g/dL (ref 6.5–8.1)

## 2018-03-10 LAB — I-STAT TROPONIN, ED: Troponin i, poc: 0.02 ng/mL (ref 0.00–0.08)

## 2018-03-10 LAB — BRAIN NATRIURETIC PEPTIDE: B Natriuretic Peptide: 1439.5 pg/mL — ABNORMAL HIGH (ref 0.0–100.0)

## 2018-03-10 NOTE — ED Triage Notes (Addendum)
Pt endorses left ear pain x 2 weeks without any leakage. Pt states that she is allergic to pineapple "and I've been eating pineapple x 1 year and I started swelling up from my feet to my head a week ago" Airway intact. No rash. Pt is on lasix 40mg  x 2 per day. Noted to have fluid in lower extremities.

## 2018-03-10 NOTE — ED Provider Notes (Signed)
Patient placed in Quick Look pathway, seen and evaluated   Chief Complaint: Worsening swelling and ear pain for 1-2 weeks.   HPI:   Patient with history of CHF on Lasix 40mg  bid -- presents with complaint of worsening swelling from the toes to the abdomen, gradually, over the past several weeks.  Patient states that this is because she is "allergic to pineapple".  Patient does have some shortness of breath with exertion.  No chest pain or fever.  Also reports left ear pain that occurs when she swallows.  ROS: CV +leg swelling, SOB; ENT +ear pain  Physical Exam:   Gen: No distress  Neuro: Awake and Alert  Skin: Warm    Focused Exam: Gen NAD; Heart RRR, nml S1,S2, no m/r/g; Lungs CTAB; Abd soft, firm soft tissue, no rebound or guarding; Ext pitting edema bilaterally from feet to mid-abdomen.    Initiation of care has begun. The patient has been counseled on the process, plan, and necessity for staying for the completion/evaluation, and the remainder of the medical screening examination    , Renne Crigler 03/10/18 1632    03/12/18, MD 03/11/18 1419

## 2018-03-11 ENCOUNTER — Other Ambulatory Visit: Payer: Self-pay

## 2018-03-11 ENCOUNTER — Encounter (HOSPITAL_COMMUNITY): Payer: Self-pay | Admitting: Surgery

## 2018-03-11 DIAGNOSIS — H9202 Otalgia, left ear: Secondary | ICD-10-CM | POA: Diagnosis present

## 2018-03-11 DIAGNOSIS — I5023 Acute on chronic systolic (congestive) heart failure: Secondary | ICD-10-CM

## 2018-03-11 DIAGNOSIS — I110 Hypertensive heart disease with heart failure: Secondary | ICD-10-CM | POA: Diagnosis not present

## 2018-03-11 DIAGNOSIS — I5043 Acute on chronic combined systolic (congestive) and diastolic (congestive) heart failure: Secondary | ICD-10-CM | POA: Diagnosis present

## 2018-03-11 DIAGNOSIS — Z882 Allergy status to sulfonamides status: Secondary | ICD-10-CM | POA: Diagnosis not present

## 2018-03-11 DIAGNOSIS — I2510 Atherosclerotic heart disease of native coronary artery without angina pectoris: Secondary | ICD-10-CM | POA: Diagnosis not present

## 2018-03-11 DIAGNOSIS — N171 Acute kidney failure with acute cortical necrosis: Secondary | ICD-10-CM | POA: Diagnosis not present

## 2018-03-11 DIAGNOSIS — Z886 Allergy status to analgesic agent status: Secondary | ICD-10-CM | POA: Diagnosis not present

## 2018-03-11 DIAGNOSIS — I10 Essential (primary) hypertension: Secondary | ICD-10-CM

## 2018-03-11 DIAGNOSIS — I5033 Acute on chronic diastolic (congestive) heart failure: Secondary | ICD-10-CM | POA: Diagnosis present

## 2018-03-11 DIAGNOSIS — N183 Chronic kidney disease, stage 3 (moderate): Secondary | ICD-10-CM | POA: Diagnosis present

## 2018-03-11 DIAGNOSIS — I428 Other cardiomyopathies: Secondary | ICD-10-CM | POA: Diagnosis not present

## 2018-03-11 DIAGNOSIS — Z9119 Patient's noncompliance with other medical treatment and regimen: Secondary | ICD-10-CM | POA: Diagnosis not present

## 2018-03-11 DIAGNOSIS — I255 Ischemic cardiomyopathy: Secondary | ICD-10-CM | POA: Diagnosis not present

## 2018-03-11 DIAGNOSIS — Z8249 Family history of ischemic heart disease and other diseases of the circulatory system: Secondary | ICD-10-CM | POA: Diagnosis not present

## 2018-03-11 DIAGNOSIS — I130 Hypertensive heart and chronic kidney disease with heart failure and stage 1 through stage 4 chronic kidney disease, or unspecified chronic kidney disease: Secondary | ICD-10-CM | POA: Diagnosis present

## 2018-03-11 DIAGNOSIS — E785 Hyperlipidemia, unspecified: Secondary | ICD-10-CM | POA: Diagnosis present

## 2018-03-11 DIAGNOSIS — Z9114 Patient's other noncompliance with medication regimen: Secondary | ICD-10-CM | POA: Diagnosis not present

## 2018-03-11 DIAGNOSIS — Z888 Allergy status to other drugs, medicaments and biological substances status: Secondary | ICD-10-CM | POA: Diagnosis not present

## 2018-03-11 DIAGNOSIS — E739 Lactose intolerance, unspecified: Secondary | ICD-10-CM | POA: Diagnosis present

## 2018-03-11 DIAGNOSIS — Z79899 Other long term (current) drug therapy: Secondary | ICD-10-CM | POA: Diagnosis not present

## 2018-03-11 DIAGNOSIS — K219 Gastro-esophageal reflux disease without esophagitis: Secondary | ICD-10-CM | POA: Diagnosis present

## 2018-03-11 DIAGNOSIS — N182 Chronic kidney disease, stage 2 (mild): Secondary | ICD-10-CM | POA: Diagnosis not present

## 2018-03-11 DIAGNOSIS — Z955 Presence of coronary angioplasty implant and graft: Secondary | ICD-10-CM | POA: Diagnosis not present

## 2018-03-11 DIAGNOSIS — N179 Acute kidney failure, unspecified: Secondary | ICD-10-CM | POA: Diagnosis not present

## 2018-03-11 DIAGNOSIS — I252 Old myocardial infarction: Secondary | ICD-10-CM | POA: Diagnosis not present

## 2018-03-11 LAB — MAGNESIUM: Magnesium: 1.9 mg/dL (ref 1.7–2.4)

## 2018-03-11 MED ORDER — POTASSIUM CHLORIDE CRYS ER 20 MEQ PO TBCR
40.0000 meq | EXTENDED_RELEASE_TABLET | Freq: Every day | ORAL | Status: DC
  Administered 2018-03-11 – 2018-03-12 (×2): 40 meq via ORAL
  Filled 2018-03-11 (×2): qty 2

## 2018-03-11 MED ORDER — SODIUM CHLORIDE 0.9% FLUSH: 3.0000 mL | INTRAVENOUS | Status: DC | PRN

## 2018-03-11 MED ORDER — TICAGRELOR 60 MG PO TABS
60.0000 mg | ORAL_TABLET | Freq: Two times a day (BID) | ORAL | Status: DC
  Administered 2018-03-11 – 2018-03-18 (×14): 60 mg via ORAL
  Filled 2018-03-11 (×15): qty 1

## 2018-03-11 MED ORDER — SPIRONOLACTONE 25 MG PO TABS
25.0000 mg | ORAL_TABLET | Freq: Every day | ORAL | Status: DC
  Administered 2018-03-11 – 2018-03-18 (×8): 25 mg via ORAL
  Filled 2018-03-11 (×8): qty 1

## 2018-03-11 MED ORDER — FERROUS SULFATE 325 (65 FE) MG PO TABS
325.0000 mg | ORAL_TABLET | ORAL | Status: DC
  Administered 2018-03-11 – 2018-03-18 (×10): 325 mg via ORAL
  Filled 2018-03-11 (×10): qty 1

## 2018-03-11 MED ORDER — ENOXAPARIN SODIUM 40 MG/0.4ML ~~LOC~~ SOLN
40.0000 mg | Freq: Every day | SUBCUTANEOUS | Status: DC
  Administered 2018-03-12 – 2018-03-17 (×4): 40 mg via SUBCUTANEOUS
  Filled 2018-03-11 (×7): qty 0.4

## 2018-03-11 MED ORDER — POTASSIUM CHLORIDE CRYS ER 20 MEQ PO TBCR
40.0000 meq | EXTENDED_RELEASE_TABLET | Freq: Once | ORAL | Status: AC
  Administered 2018-03-11: 40 meq via ORAL
  Filled 2018-03-11: qty 2

## 2018-03-11 MED ORDER — SODIUM CHLORIDE 0.9 % IV SOLN: 250.0000 mL | INTRAVENOUS | Status: DC | PRN

## 2018-03-11 MED ORDER — ISOSORB DINITRATE-HYDRALAZINE 20-37.5 MG PO TABS
1.0000 | ORAL_TABLET | Freq: Two times a day (BID) | ORAL | Status: DC
  Administered 2018-03-11 – 2018-03-18 (×8): 1 via ORAL
  Filled 2018-03-11 (×13): qty 1

## 2018-03-11 MED ORDER — FUROSEMIDE 10 MG/ML IJ SOLN
80.0000 mg | Freq: Two times a day (BID) | INTRAMUSCULAR | Status: DC
  Administered 2018-03-11 – 2018-03-18 (×14): 80 mg via INTRAVENOUS
  Filled 2018-03-11 (×15): qty 8

## 2018-03-11 MED ORDER — SODIUM CHLORIDE 0.9% FLUSH
3.0000 mL | Freq: Two times a day (BID) | INTRAVENOUS | Status: DC
  Administered 2018-03-11 – 2018-03-18 (×13): 3 mL via INTRAVENOUS

## 2018-03-11 MED ORDER — FUROSEMIDE 10 MG/ML IJ SOLN
40.0000 mg | Freq: Once | INTRAMUSCULAR | Status: AC
  Administered 2018-03-11: 40 mg via INTRAVENOUS
  Filled 2018-03-11: qty 4

## 2018-03-11 MED ORDER — FUROSEMIDE 10 MG/ML IJ SOLN
40.0000 mg | INTRAMUSCULAR | Status: AC
  Administered 2018-03-11: 40 mg via INTRAVENOUS
  Filled 2018-03-11: qty 4

## 2018-03-11 MED ORDER — ONDANSETRON HCL 4 MG/2ML IJ SOLN
4.0000 mg | Freq: Four times a day (QID) | INTRAMUSCULAR | Status: DC | PRN
  Filled 2018-03-11: qty 2

## 2018-03-11 MED ORDER — ATORVASTATIN CALCIUM 40 MG PO TABS
40.0000 mg | ORAL_TABLET | Freq: Every day | ORAL | Status: DC
  Administered 2018-03-11 – 2018-03-17 (×7): 40 mg via ORAL
  Filled 2018-03-11 (×7): qty 1

## 2018-03-11 MED ORDER — ACETAMINOPHEN 325 MG PO TABS
650.0000 mg | ORAL_TABLET | ORAL | Status: DC | PRN
  Administered 2018-03-16: 650 mg via ORAL
  Filled 2018-03-11: qty 2

## 2018-03-11 NOTE — ED Notes (Signed)
Patient was given wash clothes. 

## 2018-03-11 NOTE — ED Provider Notes (Addendum)
[facility] EMERGENCY DEPARTMENT Provider Note   CSN: [id number] Arrival date & time: 03/10/18  1546    History   Chief Complaint Chief Complaint  Patient presents with  . Otalgia    HPI Deborah Ho is a 67 y.o. female.   Patient with history of CHF (LVEF in 11/2017 10-15%) on Lasix 40mg  bid, ICM, CAD, HTN, HLD presents to the ED for evaluation of swelling.  Patient reports gradually worsening swelling which began in her bilateral feet and lower extremities, extending proximally up to her abdomen.  Initially, her swelling extended up as far as her neck, but this has slightly improved.  When swelling was worse, she reports difficulty taking a deep breath.  She has no complaints of chest pain or shortness of breath at this time.  The patient has remained compliant with her Lasix.  She believes that her symptoms may be related to a pineapple allergy; however, she has eaten pineapple consistently for 1 year and has neglected to eat it for the past 10 days.  No associated fevers, syncope.  She is followed by the heart failure clinic.  Patient reporting 40 pound weight gain.     Past Medical History:  Diagnosis Date  . CAD (coronary artery disease) 05/03/14; 05/09/14   a. anterior STEMI with early in-stent thorombosis for missed dose of Brillinta s/p PCI with DESx 2 into LAD (04/2014)  . CHF (congestive heart failure) (HCC)   . GERD (gastroesophageal reflux disease)    Probable  . HTN (hypertension)   . Hyperlipidemia   . Ischemic cardiomyopathy    a. 04/2014 ECHO with EF 45-50% b.  Repeat 2D echo 08/14/14 with EF down at 15%. Life vest placed  . Non compliance w medication regimen   . Tobacco use     Patient Active Problem List   Diagnosis Date Noted  . Acute on chronic systolic heart failure (HCC) 11/18/2017  . Acute on chronic combined systolic and diastolic CHF (congestive heart failure) (HCC) 04/02/2017  . Noncompliance with medication regimen 04/02/2017  .  Intractable nausea and vomiting 02/18/2017  . Hyperlipidemia   . GERD (gastroesophageal reflux disease)   . CHF (congestive heart failure) (HCC)   . Cellulitis of left lower extremity   . Cough productive of clear sputum   . Cellulitis of left lower leg   . Hypokalemia 01/28/2017  . Peripheral edema   . Abdominal distension 06/26/2016  . Anasarca 06/26/2016  . Chronic systolic heart failure (HCC) 02/20/2016  . CKD (chronic kidney disease), stage III (HCC)   . Adjustment disorder with mixed anxiety and depressed mood 02/28/2015  . Pleural effusion on right   . Essential hypertension   . Angioedema of lips 08/17/2014  . Ischemic cardiomyopathy 08/13/2014  . Coronary atherosclerosis of native coronary artery 05/10/2014  . History of ST elevation myocardial infarction (STEMI) 05/09/2014  . HLD (hyperlipidemia) 05/06/2014  . Tobacco abuse 05/06/2014  . Cardiomyopathy, ischemic 05/06/2014  . Elevated LFTs 05/06/2014    Past Surgical History:  Procedure Laterality Date  . CHOLECYSTECTOMY    . CORONARY ANGIOPLASTY WITH STENT PLACEMENT  05/03/14   STEMI- stent to LAD DES- Xience alpine  . CORONARY ANGIOPLASTY WITH STENT PLACEMENT  05/09/14   STEMI- overlapping stent to LAD, pt had missed a dose of Brilinta  . LEFT HEART CATHETERIZATION WITH CORONARY ANGIOGRAM N/A 05/03/2014   Procedure: LEFT HEART CATHETERIZATION WITH CORONARY ANGIOGRAM;  Surgeon: Peter M Swaziland, MD;  Location: [facility] CATH LAB;  Service:  Cardiovascular;  Laterality: N/A;  . LEFT HEART CATHETERIZATION WITH CORONARY ANGIOGRAM N/A 05/09/2014   Procedure: LEFT HEART CATHETERIZATION WITH CORONARY ANGIOGRAM;  Surgeon: Corky Crafts, MD;  Location: [facility] CATH LAB;  Service: Cardiovascular;  Laterality: N/A;  . PARTIAL HYSTERECTOMY    . PERCUTANEOUS STENT INTERVENTION  05/03/2014   Procedure: PERCUTANEOUS STENT INTERVENTION;  Surgeon: Peter M Sw[place]il[place], MD;  Location: [facility] CATH LAB;  Service: Cardiovascular;;  DES Prox LAD      OB  History   None      Home Medications    Prior to Admission medications   Medication Sig Start Date End Date Taking? Authorizing Provider  atorvastatin (LIPITOR) 80 MG tablet Take 40 mg by mouth daily at 6 PM.     [provider]  cetirizine (ZYRTEC) 10 MG tablet Take 10 mg daily by mouth.     [provider]  cholecalciferol (VITAMIN D) 1000 units tablet Take 1,000 Units by mouth. Twice a month    [provider]  ferrous sulfate 325 (65 FE) MG tablet Take 325 mg 3 (three) times daily by mouth. Monday,Wednesday, friday    [provider]  isosorbide-hydrALAZINE (BIDIL) 20-37.5 MG tablet Take 1 tablet by mouth 2 (two) times daily. 11/26/17   Graciella Freer, PA-C  losartan (COZAAR) 50 MG tablet Take 1 tablet (50 mg total) by mouth daily. 11/26/17   Graciella Freer, PA-C  magnesium oxide (MAG-OX) 400 (241.3 Mg) MG tablet Take 400 mg by mouth. Twice a month    [provider]  nitroGLYCERIN (NITROSTAT) 0.4 MG SL tablet Place 0.4 mg under the tongue every 5 (five) minutes as needed for chest pain.    [provider]  potassium chloride SA (K-DUR,KLOR-CON) 20 MEQ tablet Take 2 tablets (40 mEq total) by mouth daily. 10/21/17   Clegg, Amy D, NP  spironolactone (ALDACTONE) 25 MG tablet Take 1 tablet (25 mg total) by mouth daily. 10/21/17   Clegg, Amy D, NP  ticagrelor (BRILINTA) 60 MG TABS tablet Take 1 tablet (60 mg total) by mouth 2 (two) times daily. 04/02/17   Dhungel, Theda Belfast, MD  torsemide (DEMADEX) 20 MG tablet Take 4 tablets (80 mg total) by mouth 2 (two) times daily. 11/26/17   Graciella Freer, PA-C  traMADol (ULTRAM) 50 MG tablet Take 1-2 tablets (50-100 mg total) by mouth every 12 (twelve) hours as needed for moderate pain. 11/26/17   Graciella Freer, PA-C    Family History Family History  Problem Relation Age of Onset  . Diabetes Mother   . Hypertension Mother     Social History Social History    Tobacco Use  . Smoking status: Current Some Day Smoker    Packs/day: 0.10    Years: 0.50    Pack years: 0.05    Types: Cigarettes  . Smokeless tobacco: Current User  . Tobacco comment: down to 3 cigarettes daily (08/03/14)  Substance Use Topics  . Alcohol use: No  . Drug use: No     Allergies   Aspirin; Effient [prasugrel]; Entresto [sacubitril-valsartan]; Lactose intolerance (gi); Robitussin dm [guaifenesin-dm]; Sulfa antibiotics; Other; and Wheat bran   Review of Systems Review of Systems Ten systems reviewed and are negative for acute change, except as noted in the HPI.    Physical Exam Updated Vital Signs BP (!) 151/100   Pulse 79   Temp 97.8 F (36.6 C) (Oral)   Resp 20   SpO2 98%   Physical Exam  Constitutional: She  is oriented to person, place, and time. She appears well-developed and well-nourished. No distress.  Obese female. Anasarca.  HENT:  Head: Normocephalic and atraumatic.  Right Ear: Tympanic membrane, external ear and ear canal normal.  Left Ear: Tympanic membrane, external ear and ear canal normal.  Eyes: Conjunctivae and EOM are normal. No scleral icterus.  Neck: Normal range of motion.  Cardiovascular: Normal rate, regular rhythm and intact distal pulses.  Pulmonary/Chest: Effort normal. No respiratory distress. She has no wheezes. She has no rales.  Decreased breath sounds posteriorly on the left compared to right. No wheezing or rales bilaterally. Respirations even and unlabored.  Abdominal: She exhibits distension. She exhibits no mass. There is no rebound.  Distended abdomen, obese. Exam limited 2/2 habitus.  Musculoskeletal: Normal range of motion. She exhibits edema.  Pitting edema in BLE extending proximally to the upper abdomen; c/w anasarca.  Neurological: She is alert and oriented to person, place, and time. She exhibits normal muscle tone. Coordination normal.  GCS 15. Speech is goal oriented. Moving all extremities spontaneously.   Skin: Skin is warm and dry. No rash noted. She is not diaphoretic. No erythema. No pallor.  Psychiatric: She has a normal mood and affect. Her behavior is normal.  Nursing note and vitals reviewed.    ED Treatments / Results  Labs (all labs ordered are listed, but only abnormal results are displayed) Labs Reviewed  CBC - Abnormal; Notable for the following components:      Result Value   RBC 5.15 (*)    RDW 17.0 (*)    All other components within normal limits  COMPREHENSIVE METABOLIC PANEL - Abnormal; Notable for the following components:   Potassium 3.1 (*)    Glucose, Bld 100 (*)    Creatinine, Ser 1.18 (*)    Albumin 3.2 (*)    ALT 13 (*)    Alkaline Phosphatase 235 (*)    Total Bilirubin 4.0 (*)    GFR calc non Af Amer 47 (*)    GFR calc Af Amer 54 (*)    All other components within normal limits  BRAIN NATRIURETIC PEPTIDE - Abnormal; Notable for the following components:   B Natriuretic Peptide 1,439.5 (*)    All other components within normal limits  MAGNESIUM  I-STAT TROPONIN, ED    EKG EKG Interpretation  Date/Time:  Friday March 11 2018 00:22:03 EDT Ventricular Rate:  81 PR Interval:    QRS Duration: 85 QT Interval:  477 QTC Calculation: 554 R Axis:   155 Text Interpretation:  Sinus rhythm Abnormal lateral Q waves Anterior infarct, old Prolonged QT interval Baseline wander in lead(s) I III aVL V2 Confirmed by Gilda Crease ([id number]) on 03/11/2018 12:40:42 AM   Radiology Dg Chest 2 View  Result Date: 03/10/2018 CLINICAL DATA:  Shortness of breath.  Lower extremity swelling. EXAM: CHEST - 2 VIEW COMPARISON:  11/18/2017 and 09/22/2017 FINDINGS: Chronic cardiomegaly. Pulmonary vascularity is normal. Slight blunting of the right costophrenic angle posteriorly probably represents a tiny effusion. Bones are normal. Aortic atherosclerosis. IMPRESSION: 1. Chronic cardiomegaly. 2. Possible tiny right pleural effusion. 3.  Aortic Atherosclerosis (ICD10-I70.0).  Electronically Signed   By: Francene Boyers M.D.   On: 03/10/2018 17:01    Procedures Procedures (including critical care time)  Medications Ordered in ED Medications  furosemide (LASIX) injection 40 mg (has no administration in time range)  potassium chloride SA (K-DUR,KLOR-CON) CR tablet 40 mEq (40 mEq Oral Given 03/11/18 0029)     Initial Impression /  Assessment and Plan / ED Course  I have reviewed the triage vital signs and the nursing notes.  Pertinent labs & imaging results that were available during my care of the patient were reviewed by me and considered in my medical decision making (see chart for details).     67 y/o female presents to the ED for c/o otalgia, but also BLE edema.  Her ear exam is benign. No evidence of OE or OM. Her swelling, however, is significant and c/w progressive anasarca. She is on Lasix 40mg  BID which she reports compliance with. No SOB or CP today, but she does report some SOB when symptoms began. Hx of EF 10-15%; followed by [facility] heart failure clinic. BNP today is >1400 without concerning vascular congestion on Xray. Still, will likey require diuresis given degree of anasarca on exam. Patient given initial dose of Lasix in the ED as well as oral potassium. She will be admitted by the Cardiology/Heart Failure service for continued management.   Final Clinical Impressions(s) / ED Diagnoses   Final diagnoses:  Acute on chronic diastolic CHF (congestive heart failure) [facility])    ED Discharge Orders    None       [facility], PA-C 03/11/18 0042    03/13/18, PA-C 03/11/18 0242    03/13/18, MD 03/14/18 2139

## 2018-03-11 NOTE — ED Notes (Signed)
Paged [facility] concerning no bed order for pt. MD awaiting med rec prior to placing orders. Pharmacy contacted to do med rec.

## 2018-03-11 NOTE — ED Notes (Signed)
Renal Diet w/ Restriction Fluid was ordered for Lunch. 

## 2018-03-11 NOTE — Progress Notes (Signed)
Progress Note  Patient Name: Deborah Ho Date of Encounter: 03/11/2018  Primary Cardiologist: No primary care provider on file.   Subjective   Pt is breathing some better   No CP  Still tight  Inpatient Medications    Scheduled Meds: . atorvastatin  40 mg Oral q1800  . enoxaparin (LOVENOX) injection  40 mg Subcutaneous Daily  . ferrous sulfate  325 mg Oral 3 times per day on Mon Wed Fri  . isosorbide-hydrALAZINE  1 tablet Oral BID  . potassium chloride SA  40 mEq Oral Daily  . sodium chloride flush  3 mL Intravenous Q12H  . spironolactone  25 mg Oral Daily  . ticagrelor  60 mg Oral BID   Continuous Infusions: . sodium chloride     PRN Meds: sodium chloride, acetaminophen, ondansetron (ZOFRAN) IV, sodium chloride flush   Vital Signs    Vitals:   03/10/18 2315 03/11/18 0130 03/11/18 0200 03/11/18 1056  BP: (!) 151/100 (!) 160/97 (!) 147/95 138/82  Pulse: 79 83 79 78  Resp: 20 (!) 21 15 18   Temp:      TempSrc:      SpO2: 98% 97% 99% 96%   No intake or output data in the 24 hours ending 03/11/18 1116 There were no vitals filed for this visit.  Telemetry    SR   - Personally Reviewed  ECG      Physical Exam   GEN: Morbidly obese 67 yo acute distress.   Neck: JVP increased Cardiac: RRR, no murmurs, +S3   Respiratory: Clear to auscultation bilaterally. Abd:  ABd is tight MS: Tr edema; No deformity. Neuro:  Nonfocal  Psych: Normal affect   Labs    Chemistry Recent Labs  Lab 03/10/18 1605  NA 142  K 3.1*  CL 107  CO2 24  GLUCOSE 100*  BUN 15  CREATININE 1.18*  CALCIUM 9.3  PROT 7.0  ALBUMIN 3.2*  AST 24  ALT 13*  ALKPHOS 235*  BILITOT 4.0*  GFRNONAA 47*  GFRAA 54*  ANIONGAP 11     Hematology Recent Labs  Lab 03/10/18 1605  WBC 6.1  RBC 5.15*  HGB 14.6  HCT 45.7  MCV 88.7  MCH 28.3  MCHC 31.9  RDW 17.0*  PLT 247    Cardiac EnzymesNo results for input(s): TROPONINI in the last 168 hours.  Recent Labs  Lab  03/10/18 1615  TROPIPOC 0.02     BNP Recent Labs  Lab 03/10/18 1605  BNP 1,439.5*     DDimer No results for input(s): DDIMER in the last 168 hours.   Radiology    Dg Chest 2 View  Result Date: 03/10/2018 CLINICAL DATA:  Shortness of breath.  Lower extremity swelling. EXAM: CHEST - 2 VIEW COMPARISON:  11/18/2017 and 09/22/2017 FINDINGS: Chronic cardiomegaly. Pulmonary vascularity is normal. Slight blunting of the right costophrenic angle posteriorly probably represents a tiny effusion. Bones are normal. Aortic atherosclerosis. IMPRESSION: 1. Chronic cardiomegaly. 2. Possible tiny right pleural effusion. 3.  Aortic Atherosclerosis (ICD10-I70.0). Electronically Signed   By: 13/05/2017 M.D.   On: 03/10/2018 17:01    Cardiac Studies    Patient Profile   67 y.o. female with Hx severe LV dysfunction (LVEF 10 to 15%), CAD, HTN, HL, CKD   Presents to ED yesterday with wt gain (?40lb), tightness in chest    Not patient ran out of lasix 2 wks ago   Did not call cardiology  Was not seen by primary MD  Assessment & Plan    Acute on chronic systolic CHF   LVEF 10 to 15%  Pt fired [employer] in past   Pt admits to not taking lasix    Wt climbed over past few wks    Exam:  Difficutl with size by JVP increased   Abd is tight  Would continue diuresis   Reviewed the importance of taking ALL meds, weighing DAILY and calling office when runs out of meds or wt up Pt wonders why her heart cant get stronger  Isnt there something that can be done? Pt has been out of one of meds   Has noticed gradual wt gain over 3 wks   Discharrge wt 192  Now   2  HTN  BP has been up at home    High here on arrival    Better now with some diuresis   FOllow   3  CAD   Hx PCI/STENT x 2 to LAD in 2015  Cath with stent thrombosis after when she missed brilinta  Remains on DAPT  Will keep for nwo.  4  HL   Continue statin  Check lipids    5  Hx Noncompliance  Stressed importance of taking meds, weighing and  calling clinic     For questions or updates, please contact [facility] Please consult [email or web address] for contact info under Cardiology/STEMI.      Signed, Dietrich Pates, MD  03/11/2018, 11:16 AM

## 2018-03-11 NOTE — ED Notes (Signed)
Patient bedside toilet was empty out.

## 2018-03-11 NOTE — ED Notes (Signed)
Lab to add on magnesium  

## 2018-03-11 NOTE — H&P (Signed)
Cardiology History & Physical    Patient ID: Carlia Susko MRN: [id number], DOB: December 13, 1950 Date of Encounter: 03/11/2018, 2:11 AM Primary Physician: Grayce Sessions, NP  Chief Complaint: "Left ear popping, pineapple allergy, swelling"   HPI: Dayan Clegg is a 67 y.o. female with history of HFrEF (EF 10 to 15%), hypertension, hyperlipidemia, CAD, CKD stage III who presents to the ER due to total body swelling.  Patient states that for the past 3 weeks she has noticed that she has had increased swelling from her legs through her abdomen now "up to her ears."She had attributed this to eating pineapple which she thinks that she has an allergy to (she also states that she has eaten pineapple for the past year).  She states that she thought the swelling was causing her left ear to pop making her uncomfortable and prompting her to come to the ER for further evaluation.  At the time of my evaluation she states that she has no shortness of breath; however, she feels like she cannot take full breaths due to her abdominal fullness.  She denies chest pain, palpitations, fatigue, syncope.  Of note patient was admitted from 11/18/2017 to 11/26/2017 for acute on chronic systolic heart failure in the setting of dietary and medication adherence.  Patient states that she has been avoiding salt and has not had any dietary indiscretions.  She states that she thinks she was unable to refill 1 of her medications but cannot name which one (possibly BiDil).  She states that she checks her weight daily and has noted gradual weight increase over the past 3 weeks.  Her discharge weight in January was 192 pounds, this morning she states that she weighed 238.  She reports that she walks daily for 5 minutes and is not limited by chest pain or shortness of breath.  She states that since being more active after her last discharge, she has noticed her blood pressure has gotten higher with diastolic readings in the 110s.   Past  Medical History:  Diagnosis Date  . CAD (coronary artery disease) 05/03/14; 05/09/14   a. anterior STEMI with early in-stent thorombosis for missed dose of Brillinta s/p PCI with DESx 2 into LAD (04/2014)  . CHF (congestive heart failure) (HCC)   . GERD (gastroesophageal reflux disease)    Probable  . HTN (hypertension)   . Hyperlipidemia   . Ischemic cardiomyopathy    a. 04/2014 ECHO with EF 45-50% b.  Repeat 2D echo 08/14/14 with EF down at 15%. Life vest placed  . Non compliance w medication regimen   . Tobacco use      Surgical History:  Past Surgical History:  Procedure Laterality Date  . CHOLECYSTECTOMY    . CORONARY ANGIOPLASTY WITH STENT PLACEMENT  05/03/14   STEMI- stent to LAD DES- Xience alpine  . CORONARY ANGIOPLASTY WITH STENT PLACEMENT  05/09/14   STEMI- overlapping stent to LAD, pt had missed a dose of Brilinta  . LEFT HEART CATHETERIZATION WITH CORONARY ANGIOGRAM N/A 05/03/2014   Procedure: LEFT HEART CATHETERIZATION WITH CORONARY ANGIOGRAM;  Surgeon: Peter M Swaziland, MD;  Location: [facility] CATH LAB;  Service: Cardiovascular;  Laterality: N/A;  . LEFT HEART CATHETERIZATION WITH CORONARY ANGIOGRAM N/A 05/09/2014   Procedure: LEFT HEART CATHETERIZATION WITH CORONARY ANGIOGRAM;  Surgeon: Corky Crafts, MD;  Location: [facility] CATH LAB;  Service: Cardiovascular;  Laterality: N/A;  . PARTIAL HYSTERECTOMY    . PERCUTANEOUS STENT INTERVENTION  05/03/2014   Procedure: PERCUTANEOUS STENT INTERVENTION;  Surgeon: Peter M Swaziland, MD;  Location: [facility] CATH LAB;  Service: Cardiovascular;;  DES Prox LAD      Home Meds: Prior to Admission medications   Medication Sig Start Date End Date Taking? Authorizing Provider  atorvastatin (LIPITOR) 80 MG tablet Take 40 mg by mouth daily at 6 PM.     [provider]  cetirizine (ZYRTEC) 10 MG tablet Take 10 mg daily by mouth.     [provider]  cholecalciferol (VITAMIN D) 1000 units tablet Take 1,000 Units by mouth. Twice a month     [provider]  ferrous sulfate 325 (65 FE) MG tablet Take 325 mg 3 (three) times daily by mouth. Monday,Wednesday, friday    [provider]  isosorbide-hydrALAZINE (BIDIL) 20-37.5 MG tablet Take 1 tablet by mouth 2 (two) times daily. 11/26/17   Graciella Freer, PA-C  losartan (COZAAR) 50 MG tablet Take 1 tablet (50 mg total) by mouth daily. 11/26/17   Graciella Freer, PA-C  magnesium oxide (MAG-OX) 400 (241.3 Mg) MG tablet Take 400 mg by mouth. Twice a month    [provider]  nitroGLYCERIN (NITROSTAT) 0.4 MG SL tablet Place 0.4 mg under the tongue every 5 (five) minutes as needed for chest pain.    [provider]  potassium chloride SA (K-DUR,KLOR-CON) 20 MEQ tablet Take 2 tablets (40 mEq total) by mouth daily. 10/21/17   Clegg, Amy D, NP  spironolactone (ALDACTONE) 25 MG tablet Take 1 tablet (25 mg total) by mouth daily. 10/21/17   Clegg, Amy D, NP  ticagrelor (BRILINTA) 60 MG TABS tablet Take 1 tablet (60 mg total) by mouth 2 (two) times daily. 04/02/17   Dhungel, Theda Belfast, MD  torsemide (DEMADEX) 20 MG tablet Take 4 tablets (80 mg total) by mouth 2 (two) times daily. 11/26/17   Graciella Freer, PA-C  traMADol (ULTRAM) 50 MG tablet Take 1-2 tablets (50-100 mg total) by mouth every 12 (twelve) hours as needed for moderate pain. 11/26/17   Graciella Freer, PA-C    Allergies:  Allergies  Allergen Reactions  . Aspirin Swelling    Chewable children's aspirin makes patients tongue and face swell  . Effient [Prasugrel] Swelling    Patient's tongue and face swells  . Entresto [Sacubitril-Valsartan] Other (See Comments)    dizziness  . Lactose Intolerance (Gi) Other (See Comments)    REACTION: stomach upset  . Robitussin Dm [Guaifenesin-Dm] Swelling    Patient's tongue swells  . Sulfa Antibiotics Swelling  . Other     Had to replace "catgut" with clamps  . Wheat Bran Other (See Comments)    REACTION: unknown    Social History    Socioeconomic History  . Marital status: Widowed    Spouse name: Not on file  . Number of children: Not on file  . Years of education: Not on file  . Highest education level: Not on file  Occupational History  . 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 Financial resource strain: Not on file  . Food insecurity:    Worry: Not on file    Inability: Not on file  . Transportation needs:    Medical: Not on file    Non-medical: Not on file  Tobacco Use  . Smoking status: Current Some Day Smoker    Packs/day: 0.10    Years: 0.50    Pack years: 0.05    Types: Cigarettes  . Smokeless tobacco: Current User  . Tobacco comment: down to 3  cigarettes daily (08/03/14)  Substance and Sexual Activity  . Alcohol use: No  . Drug use: No  . Sexual activity: Not on file  Lifestyle  . Physical activity:    Days per week: Not on file    Minutes per session: Not on file  . Stress: Not on file  Relationships  . Social connections:    Talks on phone: Not on file    Gets together: Not on file    Attends religious service: Not on file    Active member of club or organization: Not on file    Attends meetings of clubs or organizations: Not on file    Relationship status: Not on file  . Intimate partner violence:    Fear of current or ex partner: Not on file    Emotionally abused: Not on file    Physically abused: Not on file    Forced sexual activity: Not on file  Other Topics Concern  . Not on file  Social History Narrative   Patient has 6 brothers and sisters and none have known coronary artery disease. She lives alone in [place], but has several brothers in the area.     Family History  Problem Relation Age of Onset  . Diabetes Mother   . Hypertension Mother     Review of Systems: All other systems reviewed and are otherwise negative except as noted above.  She specifically denies chest pain, decreased exercise tolerance, syncope, fevers.  Labs:   Lab Results  Component  Value Date   WBC 6.1 03/10/2018   HGB 14.6 03/10/2018   HCT 45.7 03/10/2018   MCV 88.7 03/10/2018   PLT 247 03/10/2018    Recent Labs  Lab 03/10/18 1605  NA 142  K 3.1*  CL 107  CO2 24  BUN 15  CREATININE 1.18*  CALCIUM 9.3  PROT 7.0  BILITOT 4.0*  ALKPHOS 235*  ALT 13*  AST 24  GLUCOSE 100*   No results for input(s): CKTOTAL, CKMB, TROPONINI in the last 72 hours. Lab Results  Component Value Date   CHOL 129 09/29/2017   HDL 34 (L) 09/29/2017   LDLCALC 82 09/29/2017   TRIG 64 09/29/2017   No results found for: DDIMER  Radiology/Studies:  Dg Chest 2 View  Result Date: 03/10/2018 CLINICAL DATA:  Shortness of breath.  Lower extremity swelling. EXAM: CHEST - 2 VIEW COMPARISON:  11/18/2017 and 09/22/2017 FINDINGS: Chronic cardiomegaly. Pulmonary vascularity is normal. Slight blunting of the right costophrenic angle posteriorly probably represents a tiny effusion. Bones are normal. Aortic atherosclerosis. IMPRESSION: 1. Chronic cardiomegaly. 2. Possible tiny right pleural effusion. 3.  Aortic Atherosclerosis (ICD10-I70.0). Electronically Signed   By: Francene Boyers M.D.   On: 03/10/2018 17:01   Wt Readings from Last 3 Encounters:  11/26/17 87.1 kg (192 lb 1.6 oz)  11/18/17 105.1 kg (231 lb 12.8 oz)  11/04/17 102.1 kg (225 lb)    EKG: Normal sinus rhythm with a right axis deviation  Physical Exam: Blood pressure (!) 151/100, pulse 79, temperature 97.8 F (36.6 C), temperature source Oral, resp. rate 20, SpO2 98 %. There is no height or weight on file to calculate BMI. General: Well developed, obese, in no acute distress. Head: Normocephalic, atraumatic, sclera non-icteric, no xanthomas, nares are without discharge.  Neck: Negative for carotid bruits. JVD to earlobe at 90 degrees Lungs: Clear bilaterally to auscultation without wheezes, rales, or rhonchi. Breathing is unlabored while upright, however rapid when laying flat. Heart: RRR with S1  S2, summation gallop. No  murmurs or rubs appreciated. Abdomen: Soft, non-tender, tense/distended abdomen with normoactive bowel sounds. No hepatomegaly. No rebound/guarding. No obvious abdominal masses. Msk:  Strength and tone appear normal for age. Extremities: No clubbing or cyanosis.  Severe pitting edema from the ankles to the hips .  Distal pedal pulses are 2+ and equal bilaterally, extremities are warm. Neuro: Alert and oriented X 3. No focal deficit. No facial asymmetry. Moves all extremities spontaneously. Psych:  Responds to questions appropriately with a normal affect.   Last TTE 11/21/2017: - Left ventricle: The cavity size was mildly dilated. Wall   thickness was normal. The estimated ejection fraction was in the   range of 10% to 15%. Doppler parameters are consistent with   restrictive physiology, indicative of decreased left ventricular   diastolic compliance and/or increased left atrial pressure. - Mitral valve: There was mild regurgitation. - Left atrium: The atrium was mildly dilated. - Right ventricle: The cavity size was mildly dilated. Systolic   function was moderately to severely reduced. - Tricuspid valve: There was moderate regurgitation. - Pulmonary arteries: PA peak pressure: 31 mm Hg (S).  Assessment and Plan   Dlisa Jaramillo is a 67 y.o. female with history of HFrEF (EF 10 to 15%), hypertension, hyperlipidemia, CAD, CKD stage III who presents to the ER due to acute on chronic systolic heart failure.  #Acute on chronic systolic heart failure, EF 10-15%: Patient presented to the emergency room with symptoms of anasarca and a 3-week history of weight gain of greater than 40 pounds and clear signs of volume overload.  She states that she has been adherent to a low-salt diet, and her medication regimen.  Of note, she was unable to list her current medications and was unclear whether she had filled all of her prescriptions.  I suspect that this exacerbation is related to medication noncompliance  as well as delicate systolic heart failure.  She has had brisk output to 40 IV of Lasix and we will re-dose that again this evening.  We will continue her chronic heart failure medications and aim to diurese her back to her dry weight.  Patient has previously refused beta-blocker, will reapproach starting a beta-blocker this admission. - We will repeat 40 IV Lasix now given good urine output - Continue isosorbide hydralazine per below -Hold losartan per below -Continue spironolactone  #CAD: History of anterior MI, PCI x2 to the LAD 04/2014.  History of stent thrombosis thought to be secondary to noncompliance.  Patient states that she has not been taking her Brilinta.  Her discharge summary from January states that she should continue her Brilinta.  We will resume this medication while inpatient.  She is not on aspirin due to an allergy. -Resume Brilinta 60 twice daily -Continue atorvastatin 80  #HTN, uncontrolled: Patient reports history of poorly controlled hypertension.  Given her severely reduced ejection fraction, this may have contributed to her current heart failure exacerbation.  Her medications were reviewed with pharmacy and she reported that she never filled her BiDil prescription and is not taking losartan (states that she was told to stop this medicine by a provider).  Per the last discharge summary, she was supposed to take both of these medications.  There are no notes available in epic since that time she is stating she should stop losartan.  Given that she has not been taking either of these medications, will start 1 with a goal of restarting both by the end of this admission. -  Resume BiDil 20-30 7.5 twice daily - Wait to start losartan pending response to BiDil  #HL: History of ASCVD currently treated with atorvastatin 80.  Last LDL checked in November was 31. -Continue atorvastatin 80  #Inpatient bundle: - Heart healthy diet - Pharmacologic DVT prophylaxis - Full  code   Signed, Oliva Bustard, MD 03/11/2018, 2:12 AM

## 2018-03-12 DIAGNOSIS — I5043 Acute on chronic combined systolic (congestive) and diastolic (congestive) heart failure: Secondary | ICD-10-CM

## 2018-03-12 DIAGNOSIS — I428 Other cardiomyopathies: Secondary | ICD-10-CM

## 2018-03-12 DIAGNOSIS — Z9114 Patient's other noncompliance with medication regimen: Secondary | ICD-10-CM

## 2018-03-12 DIAGNOSIS — I110 Hypertensive heart disease with heart failure: Secondary | ICD-10-CM

## 2018-03-12 LAB — BASIC METABOLIC PANEL
Anion gap: 12 (ref 5–15)
BUN: 17 mg/dL (ref 6–20)
CO2: 28 mmol/L (ref 22–32)
Calcium: 9.4 mg/dL (ref 8.9–10.3)
Chloride: 101 mmol/L (ref 101–111)
Creatinine, Ser: 1.24 mg/dL — ABNORMAL HIGH (ref 0.44–1.00)
GFR calc Af Amer: 51 mL/min — ABNORMAL LOW (ref >60)
GFR calc non Af Amer: 44 mL/min — ABNORMAL LOW (ref >60)
Glucose, Bld: 88 mg/dL (ref 65–99)
Potassium: 3.4 mmol/L — ABNORMAL LOW (ref 3.5–5.1)
Sodium: 141 mmol/L (ref 135–145)

## 2018-03-12 MED ORDER — METOLAZONE 2.5 MG PO TABS
2.5000 mg | ORAL_TABLET | Freq: Every day | ORAL | Status: DC
  Administered 2018-03-12 – 2018-03-18 (×7): 2.5 mg via ORAL
  Filled 2018-03-12 (×7): qty 1

## 2018-03-12 MED ORDER — BROMPHENIRAMINE-PSEUDOEPH 1-15 MG/5ML PO ELIX
5.0000 mL | ORAL_SOLUTION | Freq: Four times a day (QID) | ORAL | Status: DC | PRN
  Filled 2018-03-12: qty 5

## 2018-03-12 MED ORDER — DEXTROMETHORPHAN POLISTIREX ER 30 MG/5ML PO SUER
30.0000 mg | Freq: Two times a day (BID) | ORAL | Status: DC | PRN
  Administered 2018-03-12 – 2018-03-16 (×2): 30 mg via ORAL
  Filled 2018-03-12 (×4): qty 5

## 2018-03-12 MED ORDER — PSEUDOEPHEDRINE HCL ER 120 MG PO TB12
120.0000 mg | ORAL_TABLET | Freq: Two times a day (BID) | ORAL | Status: DC | PRN
  Administered 2018-03-14 – 2018-03-15 (×2): 120 mg via ORAL
  Filled 2018-03-12 (×4): qty 1

## 2018-03-12 MED ORDER — POTASSIUM CHLORIDE CRYS ER 20 MEQ PO TBCR
40.0000 meq | EXTENDED_RELEASE_TABLET | Freq: Two times a day (BID) | ORAL | Status: DC
  Administered 2018-03-12 – 2018-03-18 (×12): 40 meq via ORAL
  Filled 2018-03-12 (×12): qty 2

## 2018-03-12 MED ORDER — LORATADINE 10 MG PO TABS
10.0000 mg | ORAL_TABLET | Freq: Every day | ORAL | Status: DC | PRN
  Administered 2018-03-16: 10 mg via ORAL
  Filled 2018-03-12: qty 1

## 2018-03-12 NOTE — Progress Notes (Signed)
03/11/18 1421  PT Visit Information  Last PT Received On 03/11/18  Assistance Needed +1  History of Present Illness Pt is a 67 y/o female admitted secondary to increased LE swelling. Thought to be secondary to CHF. PMH includes HTN, CHF, CAD s/p stent placement, tobacco use, CKD, and ischemic cardiomyopathy.   Precautions  Precautions None  Restrictions  Weight Bearing Restrictions No  Home Living  Family/patient expects to be discharged to: Private residence  Living Arrangements Alone  Available Help at Discharge Family;Friend(s);Available PRN/intermittently  Type of Home Apartment  Home Access Elevator  Home Layout One level  Bathroom Shower/Tub Tub/shower unit;Curtain  Tour manager Grab bars - toilet;Grab bars - tub/shower (has safety button inside bathroom; have rails along wall )  Prior Function  Level of Independence Independent  Communication  Communication No difficulties  Pain Assessment  Pain Assessment No/denies pain  Cognition  Arousal/Alertness Awake/alert  Behavior During Therapy WFL for tasks assessed/performed  Overall Cognitive Status Within Functional Limits for tasks assessed  Upper Extremity Assessment  Upper Extremity Assessment Defer to OT evaluation  Lower Extremity Assessment  Lower Extremity Assessment Generalized weakness;RLE deficits/detail  RLE Deficits / Details Reports occasional numbness in toes on R foot, but only intermitent.   Cervical / Trunk Assessment  Cervical / Trunk Assessment Other exceptions  Cervical / Trunk Exceptions has pulling sensation at bottom of abdomen   Bed Mobility  General bed mobility comments Sitting EOB upon entry.   Transfers  Overall transfer level Needs assistance  Equipment used None  Transfers Sit to/from Stand  Sit to Stand Min assist  General transfer comment Min A for lift assist and steadying. Increased time required to perform.   Ambulation/Gait  Ambulation/Gait assistance  Min guard  Ambulation Distance (Feet) 40 Feet  Assistive device None  Gait Pattern/deviations Step-through pattern;Decreased stride length  General Gait Details Slow, mildly unsteady gait. Min guard for safety. Pt only agreeable to ambulating within the room as she had not eated lunch yet. Educated about walking program at home.   Gait velocity Decreased   Gait velocity interpretation 1.31 - 2.62 ft/sec, indicative of limited community ambulator  Balance  Overall balance assessment Needs assistance  Sitting-balance support No upper extremity supported;Feet supported  Sitting balance-Leahy Scale Good  Standing balance support No upper extremity supported;During functional activity  Standing balance-Leahy Scale Fair  General Comments  General comments (skin integrity, edema, etc.) Pt interested in further PT for strengthing/increasing mobility. Educated about outpatient PT and pt agreeable.   PT - End of Session  Equipment Utilized During Treatment Gait belt  Activity Tolerance Patient tolerated treatment well  Patient left in chair;with call bell/phone within reach  Nurse Communication Mobility status  PT Assessment  PT Recommendation/Assessment Patient needs continued PT services  PT Visit Diagnosis Other abnormalities of gait and mobility (R26.89)  PT Problem List Cardiopulmonary status limiting activity;Decreased strength;Decreased balance;Decreased mobility  PT Plan  PT Frequency (ACUTE ONLY) Min 3X/week  PT Treatment/Interventions (ACUTE ONLY) Gait training;Functional mobility training;Therapeutic activities;Therapeutic exercise;Balance training;Patient/family education  AM-PAC PT "6 Clicks" Daily Activity Outcome Measure  Difficulty turning over in bed (including adjusting bedclothes, sheets and blankets)? 3  Difficulty moving from lying on back to sitting on the side of the bed?  3  Difficulty sitting down on and standing up from a chair with arms (e.g., wheelchair, bedside  commode, etc,.)? 1  Help needed moving to and from a bed to chair (including a wheelchair)? 3  Help needed walking in hospital room? 3  Help needed climbing 3-5 steps with a railing?  2  6 Click Score 15  Mobility G Code  CK  PT Recommendation  Follow Up Recommendations Outpatient PT  PT equipment None recommended by PT  Individuals Consulted  Consulted and Agree with Results and Recommendations Patient  Acute Rehab PT Goals  Patient Stated Goal to increase activity level   PT Goal Formulation With patient  Time For Goal Achievement 03/25/18  Potential to Achieve Goals Good  PT Time Calculation  PT Start Time (ACUTE ONLY) 1421  PT Stop Time (ACUTE ONLY) 1438  PT Time Calculation (min) (ACUTE ONLY) 17 min  PT General Charges  $$ ACUTE PT VISIT 1 Visit  PT Evaluation  $PT Eval Low Complexity 1 Low  Written Expression  Dominant Hand Right    Late entry note  Pt presenting with problems above with deficits below. Pt overall steady, however, wanting to limit ambulation as she hadn't eaten yet. Pt with mild unsteadiness requiring min guard A for ambulation. Educated about generalized walking program to perform at home. Will continue to follow acutely to maximize functional mobility independence and safety.   Gladys Damme, PT, DPT  Acute Rehabilitation Services  Pager: [phone]

## 2018-03-12 NOTE — Care Management Note (Signed)
Case Management Note  Patient Details  Name: Deborah Ho MRN: [id number] Date of Birth: 1951-06-29  Subjective/Objective:  CHF                 Action/Plan: 03/12/2018- Patient is very well known to me from previous admissions; admitted 3 times in 6 months/ compliance issues; CM will continue to follow for progression of care. [name] RN,MHA,BSN  11/25/2017 - Patient is well known to me from previous admission;PCP: [name]; has private insurance with Medicare; patient would benefit from a Disease Management Program for CHF; HHC choice offered, pt chose [facility]; [place] with [facility] called for arrangements. CM will continue to follow for progression of care. 11/25/2017- Case discussed in Length of Stay meeting; patient made HRI ( High Risk Initiative) for readmission with [facility]. Patient made aware and is in agreement. [place] with [facility] cancelled. [name] [facility]   Expected Discharge Date:  Possibly 03/17/2018              Expected Discharge Plan:  Home w Home Health Services  Discharge planning Services  CM Consult  Status of Service:  In process, will continue to follow  [name] [phone] 03/12/2018, 9:04 AM

## 2018-03-12 NOTE — Progress Notes (Signed)
Progress Note  Patient Name: Deborah Ho Date of Encounter: 03/12/2018  Primary Cardiologist: No primary care provider on file.   Subjective   Pt is breathing some better   No CP   Inpatient Medications    Scheduled Meds: . atorvastatin  40 mg Oral q1800  . enoxaparin (LOVENOX) injection  40 mg Subcutaneous Daily  . ferrous sulfate  325 mg Oral 3 times per day on Mon Wed Fri  . furosemide  80 mg Intravenous BID  . isosorbide-hydrALAZINE  1 tablet Oral BID  . potassium chloride SA  40 mEq Oral Daily  . sodium chloride flush  3 mL Intravenous Q12H  . spironolactone  25 mg Oral Daily  . ticagrelor  60 mg Oral BID   Continuous Infusions: . sodium chloride     PRN Meds: sodium chloride, acetaminophen, ondansetron (ZOFRAN) IV, sodium chloride flush   Vital Signs    Vitals:   03/11/18 1322 03/11/18 1950 03/12/18 0054 03/12/18 0501  BP: (!) 143/91 139/88 135/81 (!) 142/90  Pulse: 82 81 79 75  Resp: 18 (!) 22 18 18   Temp: (!) 97.5 F (36.4 C) 97.9 F (36.6 C) 97.9 F (36.6 C) 97.6 F (36.4 C)  TempSrc: Oral Oral Oral Oral  SpO2: 99% 99% 96% 98%  Weight:  243 lb 9.6 oz (110.5 kg)  241 lb 12.8 oz (109.7 kg)  Height:  5\' 5"  (1.651 m)      Intake/Output Summary (Last 24 hours) at 03/12/2018 1005 Last data filed at 03/12/2018 0800 Gross per 24 hour  Intake 600 ml  Output 1200 ml  Net -600 ml   Filed Weights   03/11/18 1950 03/12/18 0501  Weight: 243 lb 9.6 oz (110.5 kg) 241 lb 12.8 oz (109.7 kg)    Telemetry    SR   - Personally Reviewed  Physical Exam   GEN: Morbidly obese 67 yo acute distress.   Neck: JVP increased Cardiac: RRR, no murmurs, +S3   Respiratory: Clear to auscultation bilaterally. Abd:  ABd is tight MS: 2+ LE edema B/L; No deformity. Neuro:  Nonfocal  Psych: Normal affect   Labs    Chemistry Recent Labs  Lab 03/10/18 1605 03/12/18 0633  NA 142 141  K 3.1* 3.4*  CL 107 101  CO2 24 28  GLUCOSE 100* 88  BUN 15 17  CREATININE  1.18* 1.24*  CALCIUM 9.3 9.4  PROT 7.0  --   ALBUMIN 3.2*  --   AST 24  --   ALT 13*  --   ALKPHOS 235*  --   BILITOT 4.0*  --   GFRNONAA 47* 44*  GFRAA 54* 51*  ANIONGAP 11 12     Hematology Recent Labs  Lab 03/10/18 1605  WBC 6.1  RBC 5.15*  HGB 14.6  HCT 45.7  MCV 88.7  MCH 28.3  MCHC 31.9  RDW 17.0*  PLT 247    Cardiac EnzymesNo results for input(s): TROPONINI in the last 168 hours.  Recent Labs  Lab 03/10/18 1615  TROPIPOC 0.02     BNP Recent Labs  Lab 03/10/18 1605  BNP 1,439.5*     DDimer No results for input(s): DDIMER in the last 168 hours.   Radiology    Dg Chest 2 View  Result Date: 03/10/2018 CLINICAL DATA:  Shortness of breath.  Lower extremity swelling. EXAM: CHEST - 2 VIEW COMPARISON:  11/18/2017 and 09/22/2017 FINDINGS: Chronic cardiomegaly. Pulmonary vascularity is normal. Slight blunting of the right costophrenic angle posteriorly  probably represents a tiny effusion. Bones are normal. Aortic atherosclerosis. IMPRESSION: 1. Chronic cardiomegaly. 2. Possible tiny right pleural effusion. 3.  Aortic Atherosclerosis (ICD10-I70.0). Electronically Signed   By: Francene Boyers M.D.   On: 03/10/2018 17:01    Cardiac Studies    Patient Profile   67 y.o. female with Hx severe LV dysfunction (LVEF 10 to 15%), CAD, HTN, HL, CKD   Presents to ED yesterday with wt gain (?40lb), tightness in chest    Not patient ran out of lasix 2 wks ago   Did not call cardiology  Was not seen by primary MD    Assessment & Plan    Acute on chronic systolic CHF   LVEF 10 to 15%  Pt fired D [employer] in past   Pt admits to not taking lasix    Wt climbed over past few wks   Baseline weight 190 lbs, now 240 lbs Start metolazone 2.5 mg po daily - replace K, follow crea closely  2  HTN  BP has been up at home    High here on arrival    Better now with some diuresis   FOllow   3  CAD   Hx PCI/STENT x 2 to LAD in 2015  Cath with stent thrombosis after when she missed  brilinta  Remains on DAPT  Will keep for nwo.  4  HL   Continue statin  Check lipids    5  Hx Noncompliance  Stressed importance of taking meds, weighing and calling clinic   For questions or updates, please contact [facility] Please consult [email or web address] for contact info under Cardiology/STEMI.      Signed, Tobias Alexander, MD  03/12/2018, 10:05 AM

## 2018-03-12 NOTE — Evaluation (Signed)
Occupational Therapy Evaluation and Discharge Patient Details Name: Deborah Ho MRN: [id number] DOB: Apr 23, 1951 Today's Date: 03/12/2018    History of Present Illness Pt is a 67 y/o female admitted secondary total body swelling. Thought to be secondary to CHF. PMH includes HTN, CHF, CAD s/p stent placement, tobacco use, CKD, and ischemic cardiomyopathy.    Clinical Impression   This 67 yo female admitted with above presents to acute OT with all education completed. No further OT needs we will D/C pt from acute OT.    Follow Up Recommendations  No OT follow up    Equipment Recommendations  None recommended by OT       Precautions / Restrictions Precautions Precautions: None Restrictions Weight Bearing Restrictions: No      Mobility Bed Mobility               General bed mobility comments: Sitting EOB upon entry.   Transfers Overall transfer level: Independent               General transfer comment: walking around room without issue when I entered        ADL either performed or assessed with clinical judgement   ADL Overall ADL's : Needs assistance/impaired Eating/Feeding: Independent;Sitting   Grooming: Independent;Sitting;Standing   Upper Body Bathing: Independent;Sitting;Standing   Lower Body Bathing: Modified independent Lower Body Bathing Details (indicate cue type and reason): Independent sit to stand; has long handled sponge Upper Body Dressing : Independent;Sitting   Lower Body Dressing: Minimal assistance Lower Body Dressing Details (indicate cue type and reason): Independent sit<>stand, she has a sock aid for socks and can use her opposite foot to remove other sock Toilet Transfer: Modified Independent;Comfort height toilet;Grab bars   Toileting- Clothing Manipulation and Hygiene: Independent;Sit to/from stand               Vision Patient Visual Report: No change from baseline              Pertinent Vitals/Pain Pain  Assessment: No/denies pain     Hand Dominance Right   Extremity/Trunk Assessment Upper Extremity Assessment Upper Extremity Assessment: Overall WFL for tasks assessed           Communication Communication Communication: No difficulties   Cognition Arousal/Alertness: Awake/alert Behavior During Therapy: WFL for tasks assessed/performed Overall Cognitive Status: Within Functional Limits for tasks assessed                                                Home Living Family/patient expects to be discharged to:: Private residence Living Arrangements: Alone Available Help at Discharge: Family;Friend(s);Available PRN/intermittently Type of Home: Apartment Home Access: Elevator     Home Layout: One level     Bathroom Shower/Tub: Tub/shower unit;Curtain(but does only sponge baths)   Bathroom Toilet: Handicapped height     Home Equipment: Grab bars - toilet;Grab bars - tub/shower(has safety button inside bathroom)          Prior Functioning/Environment Level of Independence: Independent                 OT Problem List: Decreased range of motion(due to excess fluid)         OT Goals(Current goals can be found in the care plan section) Acute Rehab OT Goals Patient Stated Goal: to get this fluid off of me so I can do more  OT Frequency:                AM-PAC PT "6 Clicks" Daily Activity     Outcome Measure Help from another person eating meals?: None Help from another person taking care of personal grooming?: None Help from another person toileting, which includes using toliet, bedpan, or urinal?: None Help from another person bathing (including washing, rinsing, drying)?: None Help from another person to put on and taking off regular upper body clothing?: None Help from another person to put on and taking off regular lower body clothing?: None 6 Click Score: 24   End of Session Equipment Utilized During Treatment: (none)  Activity  Tolerance: Patient tolerated treatment well Patient left: (sitting in chair)                   Time: 3419-6222 OT Time Calculation (min): 20 min Charges:  OT General Charges $OT Visit: 1 Visit OT Evaluation $OT Eval Moderate Complexity: [address] Ignacia Palma, [place] [phone] 03/12/2018

## 2018-03-13 LAB — BASIC METABOLIC PANEL
Anion gap: 16 — ABNORMAL HIGH (ref 5–15)
BUN: 20 mg/dL (ref 6–20)
CO2: 26 mmol/L (ref 22–32)
Calcium: 9.2 mg/dL (ref 8.9–10.3)
Chloride: 99 mmol/L — ABNORMAL LOW (ref 101–111)
Creatinine, Ser: 1.25 mg/dL — ABNORMAL HIGH (ref 0.44–1.00)
GFR calc Af Amer: 51 mL/min — ABNORMAL LOW (ref >60)
GFR calc non Af Amer: 44 mL/min — ABNORMAL LOW (ref >60)
Glucose, Bld: 87 mg/dL (ref 65–99)
Potassium: 3.6 mmol/L (ref 3.5–5.1)
Sodium: 141 mmol/L (ref 135–145)

## 2018-03-13 NOTE — Progress Notes (Signed)
Pt had 10 beats Vtach. Pt asymptomatic. No c/o SOB nor chest pain. Paged DR Dimple Casey. Waiting for return call

## 2018-03-13 NOTE — Plan of Care (Signed)
  Problem: Cardiac: Goal: Ability to achieve and maintain adequate cardiopulmonary perfusion will improve Outcome: Progressing   Problem: Clinical Measurements: Goal: Ability to maintain clinical measurements within normal limits will improve Outcome: Progressing   

## 2018-03-13 NOTE — Progress Notes (Signed)
Progress Note  Patient Name: Deborah Ho Date of Encounter: 03/13/2018  Primary Cardiologist: No primary care provider on file.   Subjective   Improved breathing.  Inpatient Medications    Scheduled Meds: . atorvastatin  40 mg Oral q1800  . enoxaparin (LOVENOX) injection  40 mg Subcutaneous Daily  . ferrous sulfate  325 mg Oral 3 times per day on Mon Wed Fri  . furosemide  80 mg Intravenous BID  . isosorbide-hydrALAZINE  1 tablet Oral BID  . metolazone  2.5 mg Oral Daily  . potassium chloride  40 mEq Oral BID  . sodium chloride flush  3 mL Intravenous Q12H  . spironolactone  25 mg Oral Daily  . ticagrelor  60 mg Oral BID   Continuous Infusions: . sodium chloride     PRN Meds: sodium chloride, acetaminophen, dextromethorphan, loratadine, ondansetron (ZOFRAN) IV, pseudoephedrine, sodium chloride flush   Vital Signs    Vitals:   03/12/18 1251 03/12/18 1955 03/13/18 0415 03/13/18 0417  BP: 139/83 (!) 153/88 135/86   Pulse: 79 85 77   Resp: 20     Temp: (!) 97 F (36.1 C) (!) 97.4 F (36.3 C) 97.7 F (36.5 C)   TempSrc: Oral Oral Oral   SpO2: 99% 100% 99%   Weight:    236 lb 14.4 oz (107.5 kg)  Height:        Intake/Output Summary (Last 24 hours) at 03/13/2018 1001 Last data filed at 03/13/2018 0600 Gross per 24 hour  Intake 720 ml  Output 2600 ml  Net -1880 ml   Filed Weights   03/11/18 1950 03/12/18 0501 03/13/18 0417  Weight: 243 lb 9.6 oz (110.5 kg) 241 lb 12.8 oz (109.7 kg) 236 lb 14.4 oz (107.5 kg)    Telemetry    SR   - Personally Reviewed  Physical Exam   GEN: Morbidly obese 67 yo acute distress.   Neck: JVP increased Cardiac: RRR, no murmurs, +S3   Respiratory: Clear to auscultation bilaterally. Abd:  ABd is tight MS: 2+ LE edema B/L; No deformity. Neuro:  Nonfocal  Psych: Normal affect   Labs    Chemistry Recent Labs  Lab 03/10/18 1605 03/12/18 0633  NA 142 141  K 3.1* 3.4*  CL 107 101  CO2 24 28  GLUCOSE 100* 88  BUN 15  17  CREATININE 1.18* 1.24*  CALCIUM 9.3 9.4  PROT 7.0  --   ALBUMIN 3.2*  --   AST 24  --   ALT 13*  --   ALKPHOS 235*  --   BILITOT 4.0*  --   GFRNONAA 47* 44*  GFRAA 54* 51*  ANIONGAP 11 12     Hematology Recent Labs  Lab 03/10/18 1605  WBC 6.1  RBC 5.15*  HGB 14.6  HCT 45.7  MCV 88.7  MCH 28.3  MCHC 31.9  RDW 17.0*  PLT 247    Cardiac EnzymesNo results for input(s): TROPONINI in the last 168 hours.  Recent Labs  Lab 03/10/18 1615  TROPIPOC 0.02     BNP Recent Labs  Lab 03/10/18 1605  BNP 1,439.5*     DDimer No results for input(s): DDIMER in the last 168 hours.   Radiology    No results found.  Cardiac Studies    Patient Profile   67 y.o. female with Hx severe LV dysfunction (LVEF 10 to 15%), CAD, HTN, HL, CKD   Presents to ED yesterday with wt gain (?40lb), tightness in chest  Not patient ran out of lasix 2 wks ago   Did not call cardiology  Was not seen by primary MD    Assessment & Plan    Acute on chronic systolic CHF   LVEF 10 to 15%  Pt fired [employer] in past   Pt admits to not taking lasix    Wt climbed over past few wks   Baseline weight 190 lbs, now 240 lbs Started metolazone 2.5 mg po daily with good response, I would continue, follow crea closely as slightrly up, replace potassium.   2  HTN  BP has been up at home    High here on arrival    Better now with some diuresis   FOllow   3  CAD   Hx PCI/STENT x 2 to LAD in 2015  Cath with stent thrombosis after when she missed brilinta  Remains on DAPT  Will keep for nwo.  4  HL   Continue statin  Check lipids    5  Hx Noncompliance  Stressed importance of taking meds, weighing and calling clinic   For questions or updates, please contact [facility] Please consult [email or web address] for contact info under Cardiology/STEMI.      Signed, Tobias Alexander, MD  03/13/2018, 10:01 AM

## 2018-03-14 DIAGNOSIS — Z9119 Patient's noncompliance with other medical treatment and regimen: Secondary | ICD-10-CM

## 2018-03-14 DIAGNOSIS — N179 Acute kidney failure, unspecified: Secondary | ICD-10-CM

## 2018-03-14 DIAGNOSIS — N182 Chronic kidney disease, stage 2 (mild): Secondary | ICD-10-CM

## 2018-03-14 LAB — BASIC METABOLIC PANEL
Anion gap: 9 (ref 5–15)
BUN: 20 mg/dL (ref 6–20)
CO2: 32 mmol/L (ref 22–32)
Calcium: 9.8 mg/dL (ref 8.9–10.3)
Chloride: 99 mmol/L — ABNORMAL LOW (ref 101–111)
Creatinine, Ser: 1.38 mg/dL — ABNORMAL HIGH (ref 0.44–1.00)
GFR calc Af Amer: 45 mL/min — ABNORMAL LOW (ref >60)
GFR calc non Af Amer: 39 mL/min — ABNORMAL LOW (ref >60)
Glucose, Bld: 85 mg/dL (ref 65–99)
Potassium: 3.9 mmol/L (ref 3.5–5.1)
Sodium: 140 mmol/L (ref 135–145)

## 2018-03-14 NOTE — Progress Notes (Signed)
Progress Note  Patient Name: Deborah Ho Date of Encounter: 03/14/2018  Primary Cardiologist: No primary care provider on file.   Subjective   She is  feeling much better today.  Inpatient Medications    Scheduled Meds: . atorvastatin  40 mg Oral q1800  . enoxaparin (LOVENOX) injection  40 mg Subcutaneous Daily  . ferrous sulfate  325 mg Oral 3 times per day on Mon Wed Fri  . furosemide  80 mg Intravenous BID  . isosorbide-hydrALAZINE  1 tablet Oral BID  . metolazone  2.5 mg Oral Daily  . potassium chloride  40 mEq Oral BID  . sodium chloride flush  3 mL Intravenous Q12H  . spironolactone  25 mg Oral Daily  . ticagrelor  60 mg Oral BID   Continuous Infusions: . sodium chloride     PRN Meds: sodium chloride, acetaminophen, dextromethorphan, loratadine, ondansetron (ZOFRAN) IV, pseudoephedrine, sodium chloride flush   Vital Signs    Vitals:   03/13/18 1302 03/13/18 1949 03/14/18 0420 03/14/18 0921  BP: 112/88 132/80 132/77 117/68  Pulse: 79 78 77 79  Resp: 20 20 20    Temp: (!) 97.5 F (36.4 C) 97.6 F (36.4 C) 97.7 F (36.5 C)   TempSrc: Oral Oral Oral   SpO2: 97% 99% 97% 94%  Weight:   232 lb 14.4 oz (105.6 kg)   Height:        Intake/Output Summary (Last 24 hours) at 03/14/2018 1146 Last data filed at 03/14/2018 1034 Gross per 24 hour  Intake 483 ml  Output 3250 ml  Net -2767 ml   Filed Weights   03/12/18 0501 03/13/18 0417 03/14/18 0420  Weight: 241 lb 12.8 oz (109.7 kg) 236 lb 14.4 oz (107.5 kg) 232 lb 14.4 oz (105.6 kg)    Telemetry    SR   - Personally Reviewed  Physical Exam   GEN: Morbidly obese 67 yo acute distress.   Neck: JVP increased Cardiac: RRR, no murmurs, +S3   Respiratory: Clear to auscultation bilaterally. Abd:  ABd is tight MS: 2+ LE edema B/L; No deformity. Neuro:  Nonfocal  Psych: Normal affect   Labs    Chemistry Recent Labs  Lab 03/10/18 1605 03/12/18 0633 03/13/18 1006 03/14/18 0705  NA 142 141 141 140  K  3.1* 3.4* 3.6 3.9  CL 107 101 99* 99*  CO2 24 28 26  32  GLUCOSE 100* 88 87 85  BUN 15 17 20 20   CREATININE 1.18* 1.24* 1.25* 1.38*  CALCIUM 9.3 9.4 9.2 9.8  PROT 7.0  --   --   --   ALBUMIN 3.2*  --   --   --   AST 24  --   --   --   ALT 13*  --   --   --   ALKPHOS 235*  --   --   --   BILITOT 4.0*  --   --   --   GFRNONAA 47* 44* 44* 39*  GFRAA 54* 51* 51* 45*  ANIONGAP 11 12 16* 9     Hematology Recent Labs  Lab 03/10/18 1605  WBC 6.1  RBC 5.15*  HGB 14.6  HCT 45.7  MCV 88.7  MCH 28.3  MCHC 31.9  RDW 17.0*  PLT 247    Cardiac EnzymesNo results for input(s): TROPONINI in the last 168 hours.  Recent Labs  Lab 03/10/18 1615  TROPIPOC 0.02     BNP Recent Labs  Lab 03/10/18 1605  BNP 1,439.5*  DDimer No results for input(s): DDIMER in the last 168 hours.   Radiology    No results found.  Cardiac Studies    Patient Profile   67 y.o. female with Hx severe LV dysfunction (LVEF 10 to 15%), CAD, HTN, HL, CKD   Presents to ED yesterday with wt gain (?40lb), tightness in chest    Not patient ran out of lasix 2 wks ago   Did not call cardiology  Was not seen by primary MD    Assessment & Plan    Acute on chronic systolic CHF   LVEF 10 to 15%  Pt fired D [employer] in past   Pt admits to not taking lasix    Wt climbed over past few wks   Baseline weight 190 lbs, now 240-->232 lbs Started metolazone 2.5 mg po daily with good response, I would continue, follow crea closely as slightly up, replace potassium.   2  HTN  BP has been up at home    High here on arrival    Better now with some diuresis   FOllow   3  CAD   Hx PCI/STENT x 2 to LAD in 2015  Cath with stent thrombosis after when she missed brilinta  Remains on DAPT  Will keep for nwo.  4  HL   Continue statin  Check lipids    5  Hx Noncompliance  Stressed importance of taking meds, weighing and calling clinic   For questions or updates, please contact [facility] Please consult [email or web address] for  contact info under Cardiology/STEMI.      Signed, Tobias Alexander, MD  03/14/2018, 11:46 AM

## 2018-03-14 NOTE — Progress Notes (Signed)
Physical Therapy Treatment/ Discharge Patient Details Name: Deborah Ho MRN: [id number] DOB: 1951-05-23 Today's Date: 03/14/2018    History of Present Illness Pt is a 67 y/o female admitted secondary total body swelling. Thought to be secondary to CHF. PMH includes HTN, CHF, CAD s/p stent placement, tobacco use, CKD, and ischemic cardiomyopathy.     PT Comments    Pt very pleasant, moving around in room without assist. Pt reports she is still retaining fluid adding weight that decreases the speed of her mobility. Pt able to ambulate long hall distance without assist, no LOB, and reports baseline functional mobility. Pt normally walks 2x a day for 15 min and encouraged adding 3rd trials and to attempt 3 trials/ day in hospital as well. Pt able to verbalize education for activity progression, healthy cooking and daily weights. No further acute therapy needs with pt aware and agreeable.    Follow Up Recommendations  No PT follow up     Equipment Recommendations  None recommended by PT    Recommendations for Other Services       Precautions / Restrictions Precautions Precautions: None    Mobility  Bed Mobility               General bed mobility comments: in chair on arrival  Transfers Overall transfer level: Independent                  Ambulation/Gait Ambulation/Gait assistance: Independent Ambulation Distance (Feet): 450 Feet Assistive device: None Gait Pattern/deviations: Decreased stride length;Step-through pattern   Gait velocity interpretation: >2.62 ft/sec, indicative of community ambulatory General Gait Details: steady gait with slightly decreased speed. No LOB and able to dual task with gait   Stairs             Wheelchair Mobility    Modified Rankin (Stroke Patients Only)       Balance Overall balance assessment: No apparent balance deficits (not formally assessed)                                           Cognition Arousal/Alertness: Awake/alert Behavior During Therapy: WFL for tasks assessed/performed Overall Cognitive Status: Within Functional Limits for tasks assessed                                        Exercises      General Comments        Pertinent Vitals/Pain Pain Assessment: No/denies pain    Home Living                      Prior Function            PT Goals (current goals can now be found in the care plan section) Progress towards PT goals: Goals met/education completed, patient discharged from PT    Frequency           PT Plan Discharge plan needs to be updated    Co-evaluation              AM-PAC PT "6 Clicks" Daily Activity  Outcome Measure  Difficulty turning over in bed (including adjusting bedclothes, sheets and blankets)?: None Difficulty moving from lying on back to sitting on the side of the bed? : None Difficulty sitting down on and standing  up from a chair with arms (e.g., wheelchair, bedside commode, etc,.)?: None Help needed moving to and from a bed to chair (including a wheelchair)?: None Help needed walking in hospital room?: None Help needed climbing 3-5 steps with a railing? : None 6 Click Score: 24    End of Session   Activity Tolerance: Patient tolerated treatment well Patient left: in chair;with call bell/phone within reach Nurse Communication: Mobility status PT Visit Diagnosis: Other abnormalities of gait and mobility (R26.89)     Time: 9656-5994 PT Time Calculation (min) (ACUTE ONLY): 16 min  Charges:  $Gait Training: 8-22 mins                    G Codes:       Elwyn Reach, [place]    [place] 03/14/2018, 10:28 AM

## 2018-03-15 DIAGNOSIS — N171 Acute kidney failure with acute cortical necrosis: Secondary | ICD-10-CM

## 2018-03-15 DIAGNOSIS — N183 Chronic kidney disease, stage 3 (moderate): Secondary | ICD-10-CM

## 2018-03-15 LAB — BASIC METABOLIC PANEL
Anion gap: 15 (ref 5–15)
BUN: 19 mg/dL (ref 6–20)
CO2: 31 mmol/L (ref 22–32)
Calcium: 10 mg/dL (ref 8.9–10.3)
Chloride: 94 mmol/L — ABNORMAL LOW (ref 101–111)
Creatinine, Ser: 1.51 mg/dL — ABNORMAL HIGH (ref 0.44–1.00)
GFR calc Af Amer: 40 mL/min — ABNORMAL LOW (ref >60)
GFR calc non Af Amer: 35 mL/min — ABNORMAL LOW (ref >60)
Glucose, Bld: 84 mg/dL (ref 65–99)
Potassium: 4.3 mmol/L (ref 3.5–5.1)
Sodium: 140 mmol/L (ref 135–145)

## 2018-03-15 NOTE — Progress Notes (Addendum)
Progress Note  Patient Name: Deborah Ho Date of Encounter: 03/15/2018  Primary Cardiologist: Peter Swaziland, MD  Electrophysiologist: Dr. Graciela Husbands   Subjective   No dyspnea. Denies CP. Still with LEE. She feels a bit tired, which she contributes to her BP being lower than her usual baseline.   Inpatient Medications    Scheduled Meds: . atorvastatin  40 mg Oral q1800  . enoxaparin (LOVENOX) injection  40 mg Subcutaneous Daily  . ferrous sulfate  325 mg Oral 3 times per day on Mon Wed Fri  . furosemide  80 mg Intravenous BID  . isosorbide-hydrALAZINE  1 tablet Oral BID  . metolazone  2.5 mg Oral Daily  . potassium chloride  40 mEq Oral BID  . sodium chloride flush  3 mL Intravenous Q12H  . spironolactone  25 mg Oral Daily  . ticagrelor  60 mg Oral BID   Continuous Infusions: . sodium chloride     PRN Meds: sodium chloride, acetaminophen, dextromethorphan, loratadine, ondansetron (ZOFRAN) IV, pseudoephedrine, sodium chloride flush   Vital Signs    Vitals:   03/14/18 2021 03/14/18 2352 03/15/18 0536 03/15/18 0859  BP: 126/67 127/75 114/69 (!) 123/59  Pulse: 85 84 80 84  Resp: 16  18   Temp: (!) 97.5 F (36.4 C)  (!) 97.5 F (36.4 C)   TempSrc: Oral  Oral   SpO2: 94%  96%   Weight:   224 lb 8 oz (101.8 kg)   Height:        Intake/Output Summary (Last 24 hours) at 03/15/2018 0910 Last data filed at 03/15/2018 2426 Gross per 24 hour  Intake 926 ml  Output 2900 ml  Net -1974 ml   Filed Weights   03/13/18 0417 03/14/18 0420 03/15/18 0536  Weight: 236 lb 14.4 oz (107.5 kg) 232 lb 14.4 oz (105.6 kg) 224 lb 8 oz (101.8 kg)    Telemetry    NSR w/ low voltage - Personally Reviewed  ECG    NSR - Personally Reviewed  Physical Exam   GEN: No acute distress.  Obese  Neck: No JVD Cardiac: RRR, no murmurs, rubs, or gallops.  Respiratory: Clear to auscultation bilaterally. GI: Soft, nontender, non-distended  MS:1+ pretibial edema; No deformity. Neuro:  Nonfocal    Psych: Normal affect   Labs    Chemistry Recent Labs  Lab 03/10/18 1605  03/13/18 1006 03/14/18 0705 03/15/18 0626  NA 142   < > 141 140 140  K 3.1*   < > 3.6 3.9 4.3  CL 107   < > 99* 99* 94*  CO2 24   < > 26 32 31  GLUCOSE 100*   < > 87 85 84  BUN 15   < > 20 20 19   CREATININE 1.18*   < > 1.25* 1.38* 1.51*  CALCIUM 9.3   < > 9.2 9.8 10.0  PROT 7.0  --   --   --   --   ALBUMIN 3.2*  --   --   --   --   AST 24  --   --   --   --   ALT 13*  --   --   --   --   ALKPHOS 235*  --   --   --   --   BILITOT 4.0*  --   --   --   --   GFRNONAA 47*   < > 44* 39* 35*  GFRAA 54*   < > 51*  45* 40*  ANIONGAP 11   < > 16* 9 15   < > = values in this interval not displayed.     Hematology Recent Labs  Lab 03/10/18 1605  WBC 6.1  RBC 5.15*  HGB 14.6  HCT 45.7  MCV 88.7  MCH 28.3  MCHC 31.9  RDW 17.0*  PLT 247    Cardiac EnzymesNo results for input(s): TROPONINI in the last 168 hours.  Recent Labs  Lab 03/10/18 1615  TROPIPOC 0.02     BNP Recent Labs  Lab 03/10/18 1605  BNP 1,439.5*     DDimer No results for input(s): DDIMER in the last 168 hours.   Radiology    No results found.  Cardiac Studies   2D Echo 11/21/2017  Study Conclusions  - Left ventricle: The cavity size was mildly dilated. Wall   thickness was normal. The estimated ejection fraction was in the   range of 10% to 15%. Doppler parameters are consistent with   restrictive physiology, indicative of decreased left ventricular   diastolic compliance and/or increased left atrial pressure. - Mitral valve: There was mild regurgitation. - Left atrium: The atrium was mildly dilated. - Right ventricle: The cavity size was mildly dilated. Systolic   function was moderately to severely reduced. - Tricuspid valve: There was moderate regurgitation. - Pulmonary arteries: PA peak pressure: 31 mm Hg (S).  Impressions:  - Technically very limited study due to poor sound wave   transmission. There  severe global LV dysfunction with moderate to   sevre RV dysfunction.   Patient Profile     67 y.o. female with Hx severe LV dysfunction (LVEF 10 to 15%), CAD s/p LAD stent in 2015, HTN, HL, CKD   Presents to ED yesterday with wt gain (?50lb), tightness in chest    Not patient ran out of lasix 2 wks ago   Did not call cardiology  Was not seen by primary MD     Assessment & Plan    1. Acute on Chronic Systolic CHF:  Low EF, 10-15%. Was being followed in the AHF clinic but stopped going. Acute exacerbation 2/2 medication noncompliance. Gradual weight gain after pt stopped lasix. Baseline dry weight, per pt report is ~190 lb. Admit weight 240 lb. Diuresing well with IV Lasix and metolazone w/ an additional 2.9 L out yesterday. Net I/Os negative 7.32 L since admit.  Weight continues to trend downward, now at 224 lb. K and BP WNL however SCr rising from 1.25>1.38>>1.51. She is getting IV Lasix + PO metolazone + spironolactone. ? Holding metolazone. Will defer to MD.  Continue strict I/Os, daily weights, daily BMPs and low sodium diet. Her losartan is currently on hold. Resume once renal function improves. Once diuresed, recommend adding BB to regimen, if BP and HR allows.    2. HTN: stable this morning at 123/59. Continue current regimen.   3. CAD: s/p LAD PCI + stenting in 2015, followed by stent thrombosis after missed doses of Brilinta, requiring repeat PCI. She remains on DAPT w/ ASA + Brilinta (lower maintenance dose of 60 mg BID), along w/ statin and LA nitrate. Currently not on BB. Given her low EF, she would benefit from addition of Coreg or Toprol XL. She denies any recent CP.   4. HLD: on statin therapy with Lipitor 40 mg. Last lipid panel was 09/2017 and level was not at goal, at 82 mg/DL. Will order repeat lipid panel in the AM.   5. H/o Med Noncompliance:  stressed importance of compliance.   For questions or updates, please contact [facility] Please consult [email or web address] for contact  info under Cardiology/STEMI.   Signed, Robbie Lis, PA-C  03/15/2018, 9:10 AM    The patient was seen, examined and discussed with Brittainy M. Sharol Harness, PA-C and I agree with the above.   Acute on chronic systolic CHF   LVEF 10 to 15%  Pt fired D Bensimhon in past   Pt admits to not taking lasix    Wt climbed over past few wks   Baseline weight 190 lbs, now 240-->232 -->224 lbs Started metolazone 2.5 mg po daily with good response, I would continue for 1 more day, follow crea closely as trending up, replace potassium. Advised to exercise, walk.  Tobias Alexander, MD  03/15/2018

## 2018-03-15 NOTE — Care Management Important Message (Signed)
Important Message  Patient Details  Name: Deborah Ho MRN: [id number] Date of Birth: September 01, 1951   Medicare Important Message Given:  Yes    Makaylyn Sinyard 03/15/2018, 11:53 AM

## 2018-03-15 NOTE — Progress Notes (Signed)
CM talked to patient about [facility] for CHF, pt chose [facility]; [place] with [facility] called for arrangements. [name] [facility] [phone]

## 2018-03-16 LAB — BASIC METABOLIC PANEL
Anion gap: 12 (ref 5–15)
BUN: 23 mg/dL — ABNORMAL HIGH (ref 6–20)
CO2: 27 mmol/L (ref 22–32)
Calcium: 9.8 mg/dL (ref 8.9–10.3)
Chloride: 98 mmol/L — ABNORMAL LOW (ref 101–111)
Creatinine, Ser: 1.54 mg/dL — ABNORMAL HIGH (ref 0.44–1.00)
GFR calc Af Amer: 39 mL/min — ABNORMAL LOW (ref >60)
GFR calc non Af Amer: 34 mL/min — ABNORMAL LOW (ref >60)
Glucose, Bld: 116 mg/dL — ABNORMAL HIGH (ref 65–99)
Potassium: 4.6 mmol/L (ref 3.5–5.1)
Sodium: 137 mmol/L (ref 135–145)

## 2018-03-16 LAB — LIPID PANEL
Cholesterol: 126 mg/dL (ref 0–200)
HDL: 31 mg/dL — ABNORMAL LOW (ref >40)
LDL Cholesterol: 81 mg/dL (ref 0–99)
Total CHOL/HDL Ratio: 4.1 RATIO
Triglycerides: 70 mg/dL (ref <150)
VLDL: 14 mg/dL (ref 0–40)

## 2018-03-16 NOTE — Progress Notes (Signed)
Pt is alert and oriented stated that she was still hungry and had a very small portion of chicken salad. Order another small bowl per patient request.

## 2018-03-16 NOTE — Progress Notes (Signed)
Patient experiencing thick sinus drainage and asked for Claritin and cough syrup.

## 2018-03-16 NOTE — Progress Notes (Signed)
Progress Note  Patient Name: Deborah Ho Date of Encounter: 03/16/2018  Primary Cardiologist: Peter Swaziland, MD  Electrophysiologist: Dr. Graciela Husbands   Subjective   No dyspnea. Denies CP. Still with LEE. She feels a bit tired, which she contributes to her BP being lower than her usual baseline.   Inpatient Medications    Scheduled Meds: . atorvastatin  40 mg Oral q1800  . enoxaparin (LOVENOX) injection  40 mg Subcutaneous Daily  . ferrous sulfate  325 mg Oral 3 times per day on Mon Wed Fri  . furosemide  80 mg Intravenous BID  . isosorbide-hydrALAZINE  1 tablet Oral BID  . metolazone  2.5 mg Oral Daily  . potassium chloride  40 mEq Oral BID  . sodium chloride flush  3 mL Intravenous Q12H  . spironolactone  25 mg Oral Daily  . ticagrelor  60 mg Oral BID   Continuous Infusions: . sodium chloride     PRN Meds: sodium chloride, acetaminophen, dextromethorphan, loratadine, ondansetron (ZOFRAN) IV, pseudoephedrine, sodium chloride flush   Vital Signs    Vitals:   03/15/18 2004 03/16/18 0500 03/16/18 0523 03/16/18 1224  BP: 112/68  120/66 117/77  Pulse: 83  83 81  Resp: 16  13 20   Temp: 97.9 F (36.6 C)  (!) 97.5 F (36.4 C) 97.6 F (36.4 C)  TempSrc: Oral  Oral Oral  SpO2: 92%  95% 97%  Weight:  219 lb 4.8 oz (99.5 kg)    Height:        Intake/Output Summary (Last 24 hours) at 03/16/2018 1320 Last data filed at 03/16/2018 0904 Gross per 24 hour  Intake 363 ml  Output 2600 ml  Net -2237 ml   Filed Weights   03/14/18 0420 03/15/18 0536 03/16/18 0500  Weight: 232 lb 14.4 oz (105.6 kg) 224 lb 8 oz (101.8 kg) 219 lb 4.8 oz (99.5 kg)    Telemetry    NSR w/ low voltage - Personally Reviewed  ECG    NSR - Personally Reviewed  Physical Exam   GEN: No acute distress.  Obese  Neck: No JVD Cardiac: RRR, no murmurs, rubs, or gallops.  Respiratory: Clear to auscultation bilaterally. GI: Soft, nontender, non-distended  MS:1+ pretibial edema; No deformity. Neuro:   Nonfocal  Psych: Normal affect   Labs    Chemistry Recent Labs  Lab 03/10/18 1605  03/14/18 0705 03/15/18 0626 03/16/18 0414  NA 142   < > 140 140 137  K 3.1*   < > 3.9 4.3 4.6  CL 107   < > 99* 94* 98*  CO2 24   < > 32 31 27  GLUCOSE 100*   < > 85 84 116*  BUN 15   < > 20 19 23*  CREATININE 1.18*   < > 1.38* 1.51* 1.54*  CALCIUM 9.3   < > 9.8 10.0 9.8  PROT 7.0  --   --   --   --   ALBUMIN 3.2*  --   --   --   --   AST 24  --   --   --   --   ALT 13*  --   --   --   --   ALKPHOS 235*  --   --   --   --   BILITOT 4.0*  --   --   --   --   GFRNONAA 47*   < > 39* 35* 34*  GFRAA 54*   < > 45*  40* 39*  ANIONGAP 11   < > 9 15 12    < > = values in this interval not displayed.     Hematology Recent Labs  Lab 03/10/18 1605  WBC 6.1  RBC 5.15*  HGB 14.6  HCT 45.7  MCV 88.7  MCH 28.3  MCHC 31.9  RDW 17.0*  PLT 247    Cardiac EnzymesNo results for input(s): TROPONINI in the last 168 hours.  Recent Labs  Lab 03/10/18 1615  TROPIPOC 0.02     BNP Recent Labs  Lab 03/10/18 1605  BNP 1,439.5*     DDimer No results for input(s): DDIMER in the last 168 hours.   Radiology    No results found.  Cardiac Studies   2D Echo 11/21/2017  Study Conclusions  - Left ventricle: The cavity size was mildly dilated. Wall   thickness was normal. The estimated ejection fraction was in the   range of 10% to 15%. Doppler parameters are consistent with   restrictive physiology, indicative of decreased left ventricular   diastolic compliance and/or increased left atrial pressure. - Mitral valve: There was mild regurgitation. - Left atrium: The atrium was mildly dilated. - Right ventricle: The cavity size was mildly dilated. Systolic   function was moderately to severely reduced. - Tricuspid valve: There was moderate regurgitation. - Pulmonary arteries: PA peak pressure: 31 mm Hg (S).  Impressions:  - Technically very limited study due to poor sound wave    transmission. There severe global LV dysfunction with moderate to   sevre RV dysfunction.   Patient Profile     67 y.o. female with Hx severe LV dysfunction (LVEF 10 to 15%), CAD s/p LAD stent in 2015, HTN, HL, CKD   Presents to ED yesterday with wt gain (?50lb), tightness in chest    Not patient ran out of lasix 2 wks ago   Did not call cardiology  Was not seen by primary MD     Assessment & Plan    1. Acute on Chronic Systolic CHF:  Low EF, 10-15%. Was being followed in the AHF clinic but stopped going. Acute exacerbation 2/2 medication noncompliance. Gradual weight gain after pt stopped lasix. Baseline dry weight, per pt report is ~190 lb. Admit weight 240 lb. Diuresing well with IV Lasix and metolazone w/ an additional 2.9 L out yesterday. Net I/Os negative 7.32 L since admit.  Weight continues to trend downward, now at 224 lb. K and BP WNL however SCr rising from 1.25>1.38>>1.51. She is getting IV Lasix + PO metolazone + spironolactone. ? Holding metolazone. Will defer to MD.  Continue strict I/Os, daily weights, daily BMPs and low sodium diet. Her losartan is currently on hold. Resume once renal function improves. Once diuresed, recommend adding BB to regimen, if BP and HR allows.    2. HTN: stable this morning at 123/59. Continue current regimen.   3. CAD: s/p LAD PCI + stenting in 2015, followed by stent thrombosis after missed doses of Brilinta, requiring repeat PCI. She remains on DAPT w/ ASA + Brilinta (lower maintenance dose of 60 mg BID), along w/ statin and LA nitrate. Currently not on BB. Given her low EF, she would benefit from addition of Coreg or Toprol XL. She denies any recent CP.   4. HLD: on statin therapy with Lipitor 40 mg. Last lipid panel was 09/2017 and level was not at goal, at 82 mg/DL. Will order repeat lipid panel in the AM.   5. H/o Med Noncompliance:  stressed importance of compliance.   Acute on chronic systolic CHF   LVEF 10 to 15%  Pt fired D Bensimhon in past    Pt admits to not taking lasix    Wt climbed over past few wks   Baseline weight 190 lbs, now 240-->232 -->224 -->219 lbs Started metolazone 2.5 mg po daily with good response, I would continue for 1 more day, follow crea closely as trending up, replace potassium. Advised to exercise, walk.  Tobias Alexander, MD  03/16/2018

## 2018-03-17 DIAGNOSIS — I10 Essential (primary) hypertension: Secondary | ICD-10-CM

## 2018-03-17 NOTE — Progress Notes (Signed)
Progress Note  Patient Name: Deborah Ho Date of Encounter: 03/17/2018  Primary Cardiologist: Peter Swaziland, MD  Electrophysiologist: Dr. Graciela Husbands   Subjective   No dyspnea. Denies CP. Still with LEE. She feels a bit tired, which she contributes to her BP being lower than her usual baseline.   Inpatient Medications    Scheduled Meds: . atorvastatin  40 mg Oral q1800  . enoxaparin (LOVENOX) injection  40 mg Subcutaneous Daily  . ferrous sulfate  325 mg Oral 3 times per day on Mon Wed Fri  . furosemide  80 mg Intravenous BID  . isosorbide-hydrALAZINE  1 tablet Oral BID  . metolazone  2.5 mg Oral Daily  . potassium chloride  40 mEq Oral BID  . sodium chloride flush  3 mL Intravenous Q12H  . spironolactone  25 mg Oral Daily  . ticagrelor  60 mg Oral BID   Continuous Infusions: . sodium chloride     PRN Meds: sodium chloride, acetaminophen, dextromethorphan, loratadine, ondansetron (ZOFRAN) IV, pseudoephedrine, sodium chloride flush   Vital Signs    Vitals:   03/16/18 1224 03/16/18 2036 03/17/18 0500 03/17/18 0524  BP: 117/77 118/78  120/77  Pulse: 81 82  85  Resp: 20 18  19   Temp: 97.6 F (36.4 C) 97.7 F (36.5 C)  98.1 F (36.7 C)  TempSrc: Oral Oral  Oral  SpO2: 97% 99%  98%  Weight:   212 lb 9.6 oz (96.4 kg)   Height:        Intake/Output Summary (Last 24 hours) at 03/17/2018 1128 Last data filed at 03/17/2018 1000 Gross per 24 hour  Intake 815 ml  Output 3300 ml  Net -2485 ml   Filed Weights   03/15/18 0536 03/16/18 0500 03/17/18 0500  Weight: 224 lb 8 oz (101.8 kg) 219 lb 4.8 oz (99.5 kg) 212 lb 9.6 oz (96.4 kg)    Telemetry    NSR w/ low voltage - Personally Reviewed  ECG    NSR - Personally Reviewed  Physical Exam   GEN: No acute distress.  Obese  Neck: No JVD Cardiac: RRR, no murmurs, rubs, or gallops.  Respiratory: Clear to auscultation bilaterally. GI: Soft, nontender, non-distended  MS:1+ pretibial edema; No deformity. Neuro:  Nonfocal    Psych: Normal affect   Labs    Chemistry Recent Labs  Lab 03/10/18 1605  03/14/18 0705 03/15/18 0626 03/16/18 0414  NA 142   < > 140 140 137  K 3.1*   < > 3.9 4.3 4.6  CL 107   < > 99* 94* 98*  CO2 24   < > 32 31 27  GLUCOSE 100*   < > 85 84 116*  BUN 15   < > 20 19 23*  CREATININE 1.18*   < > 1.38* 1.51* 1.54*  CALCIUM 9.3   < > 9.8 10.0 9.8  PROT 7.0  --   --   --   --   ALBUMIN 3.2*  --   --   --   --   AST 24  --   --   --   --   ALT 13*  --   --   --   --   ALKPHOS 235*  --   --   --   --   BILITOT 4.0*  --   --   --   --   GFRNONAA 47*   < > 39* 35* 34*  GFRAA 54*   < > 45*  40* 39*  ANIONGAP 11   < > 9 15 12    < > = values in this interval not displayed.     Hematology Recent Labs  Lab 03/10/18 1605  WBC 6.1  RBC 5.15*  HGB 14.6  HCT 45.7  MCV 88.7  MCH 28.3  MCHC 31.9  RDW 17.0*  PLT 247    Cardiac EnzymesNo results for input(s): TROPONINI in the last 168 hours.  Recent Labs  Lab 03/10/18 1615  TROPIPOC 0.02     BNP Recent Labs  Lab 03/10/18 1605  BNP 1,439.5*     DDimer No results for input(s): DDIMER in the last 168 hours.   Radiology    No results found.  Cardiac Studies   2D Echo 11/21/2017  Study Conclusions  - Left ventricle: The cavity size was mildly dilated. Wall   thickness was normal. The estimated ejection fraction was in the   range of 10% to 15%. Doppler parameters are consistent with   restrictive physiology, indicative of decreased left ventricular   diastolic compliance and/or increased left atrial pressure. - Mitral valve: There was mild regurgitation. - Left atrium: The atrium was mildly dilated. - Right ventricle: The cavity size was mildly dilated. Systolic   function was moderately to severely reduced. - Tricuspid valve: There was moderate regurgitation. - Pulmonary arteries: PA peak pressure: 31 mm Hg (S).  Impressions:  - Technically very limited study due to poor sound wave   transmission. There  severe global LV dysfunction with moderate to   sevre RV dysfunction.   Patient Profile     67 y.o. female with Hx severe LV dysfunction (LVEF 10 to 15%), CAD s/p LAD stent in 2015, HTN, HL, CKD   Presents to ED yesterday with wt gain (?50lb), tightness in chest    Not patient ran out of lasix 2 wks ago   Did not call cardiology  Was not seen by primary MD     Assessment & Plan    1. Acute on Chronic Systolic CHF:  Low EF, 10-15%. Was being followed in the AHF clinic but stopped going. Acute exacerbation 2/2 medication noncompliance. Gradual weight gain after pt stopped lasix. Baseline dry weight, per pt report is ~190 lb. Admit weight 240 lb. Diuresing well with IV Lasix and metolazone w/ an additional 2.9 L out yesterday. Net I/Os negative 7.32 L since admit.  Weight continues to trend downward, now at 224 lb. K and BP WNL however SCr rising from 1.25>1.38>>1.51. She is getting IV Lasix + PO metolazone + spironolactone. ? Holding metolazone. Will defer to MD.  Continue strict I/Os, daily weights, daily BMPs and low sodium diet. Her losartan is currently on hold. Resume once renal function improves. Once diuresed, recommend adding BB to regimen, if BP and HR allows.    2. HTN: stable this morning at 123/59. Continue current regimen.   3. CAD: s/p LAD PCI + stenting in 2015, followed by stent thrombosis after missed doses of Brilinta, requiring repeat PCI. She remains on DAPT w/ ASA + Brilinta (lower maintenance dose of 60 mg BID), along w/ statin and LA nitrate. Currently not on BB. Given her low EF, she would benefit from addition of Coreg or Toprol XL. She denies any recent CP.   4. HLD: on statin therapy with Lipitor 40 mg. Last lipid panel was 09/2017 and level was not at goal, at 82 mg/DL. Will order repeat lipid panel in the AM.   5. H/o Med Noncompliance:  stressed importance of compliance.   Acute on chronic systolic CHF   LVEF 10 to 15%  Pt fired D Bensimhon in past   Pt admits to not  taking lasix    Wt climbed over past few wks   Baseline weight 190 lbs, now 240-->232 -->224 -->219 lbs Started metolazone 2.5 mg po daily with good response, I would continue for 1 more day, follow crea closely as trending up, replace potassium. Advised to exercise, walk.  Tobias Alexander, MD  03/17/2018

## 2018-03-17 NOTE — Progress Notes (Signed)
Patient refused to take her Isosorbide-hydralazine (Bidil) saying her blood pressure is low. I informed the patient that her blood pressure 137/72. I educated on the importance of taking her medications. She still refused to take the medication. She said she knows the doctors will discuss it with her tomorrow but will not take it tonight.

## 2018-03-17 NOTE — Progress Notes (Signed)
Progress Note  Patient Name: Deborah Ho Date of Encounter: 03/17/2018  Primary Cardiologist: Peter Swaziland, MD  Electrophysiologist: Dr. Graciela Husbands   Subjective   She feels somewhat better, not yet at her usual baseline.   Inpatient Medications    Scheduled Meds: . atorvastatin  40 mg Oral q1800  . enoxaparin (LOVENOX) injection  40 mg Subcutaneous Daily  . ferrous sulfate  325 mg Oral 3 times per day on Mon Wed Fri  . furosemide  80 mg Intravenous BID  . isosorbide-hydrALAZINE  1 tablet Oral BID  . metolazone  2.5 mg Oral Daily  . potassium chloride  40 mEq Oral BID  . sodium chloride flush  3 mL Intravenous Q12H  . spironolactone  25 mg Oral Daily  . ticagrelor  60 mg Oral BID   Continuous Infusions: . sodium chloride     PRN Meds: sodium chloride, acetaminophen, dextromethorphan, loratadine, ondansetron (ZOFRAN) IV, pseudoephedrine, sodium chloride flush   Vital Signs    Vitals:   03/16/18 1224 03/16/18 2036 03/17/18 0500 03/17/18 0524  BP: 117/77 118/78  120/77  Pulse: 81 82  85  Resp: 20 18  19   Temp: 97.6 F (36.4 C) 97.7 F (36.5 C)  98.1 F (36.7 C)  TempSrc: Oral Oral  Oral  SpO2: 97% 99%  98%  Weight:   212 lb 9.6 oz (96.4 kg)   Height:        Intake/Output Summary (Last 24 hours) at 03/17/2018 1155 Last data filed at 03/17/2018 1000 Gross per 24 hour  Intake 815 ml  Output 3300 ml  Net -2485 ml   Filed Weights   03/15/18 0536 03/16/18 0500 03/17/18 0500  Weight: 224 lb 8 oz (101.8 kg) 219 lb 4.8 oz (99.5 kg) 212 lb 9.6 oz (96.4 kg)    Telemetry    NSR w/ low voltage - Personally Reviewed  ECG    NSR - Personally Reviewed  Physical Exam   GEN: No acute distress.  Obese  Neck: No JVD Cardiac: RRR, no murmurs, rubs, or gallops.  Respiratory: Clear to auscultation bilaterally. GI: Soft, nontender, non-distended  MS:1+ pretibial edema; No deformity. Neuro:  Nonfocal  Psych: Normal affect   Labs    Chemistry Recent Labs  Lab  03/10/18 1605  03/14/18 0705 03/15/18 0626 03/16/18 0414  NA 142   < > 140 140 137  K 3.1*   < > 3.9 4.3 4.6  CL 107   < > 99* 94* 98*  CO2 24   < > 32 31 27  GLUCOSE 100*   < > 85 84 116*  BUN 15   < > 20 19 23*  CREATININE 1.18*   < > 1.38* 1.51* 1.54*  CALCIUM 9.3   < > 9.8 10.0 9.8  PROT 7.0  --   --   --   --   ALBUMIN 3.2*  --   --   --   --   AST 24  --   --   --   --   ALT 13*  --   --   --   --   ALKPHOS 235*  --   --   --   --   BILITOT 4.0*  --   --   --   --   GFRNONAA 47*   < > 39* 35* 34*  GFRAA 54*   < > 45* 40* 39*  ANIONGAP 11   < > 9 15 12    < > =  values in this interval not displayed.     Hematology Recent Labs  Lab 03/10/18 1605  WBC 6.1  RBC 5.15*  HGB 14.6  HCT 45.7  MCV 88.7  MCH 28.3  MCHC 31.9  RDW 17.0*  PLT 247    Cardiac EnzymesNo results for input(s): TROPONINI in the last 168 hours.  Recent Labs  Lab 03/10/18 1615  TROPIPOC 0.02     BNP Recent Labs  Lab 03/10/18 1605  BNP 1,439.5*     DDimer No results for input(s): DDIMER in the last 168 hours.   Radiology    No results found.  Cardiac Studies   2D Echo 11/21/2017  Study Conclusions  - Left ventricle: The cavity size was mildly dilated. Wall   thickness was normal. The estimated ejection fraction was in the   range of 10% to 15%. Doppler parameters are consistent with   restrictive physiology, indicative of decreased left ventricular   diastolic compliance and/or increased left atrial pressure. - Mitral valve: There was mild regurgitation. - Left atrium: The atrium was mildly dilated. - Right ventricle: The cavity size was mildly dilated. Systolic   function was moderately to severely reduced. - Tricuspid valve: There was moderate regurgitation. - Pulmonary arteries: PA peak pressure: 31 mm Hg (S).  Impressions:  - Technically very limited study due to poor sound wave   transmission. There severe global LV dysfunction with moderate to   sevre RV  dysfunction.   Patient Profile     67 y.o. female with Hx severe LV dysfunction (LVEF 10 to 15%), CAD s/p LAD stent in 2015, HTN, HL, CKD   Presents to ED yesterday with wt gain (?50lb), tightness in chest    Not patient ran out of lasix 2 wks ago   Did not call cardiology  Was not seen by primary MD     Assessment & Plan    1. Acute on Chronic Systolic CHF:  Low EF, 10-15%. Was being followed in the AHF clinic but stopped going. Acute exacerbation 2/2 medication noncompliance. Gradual weight gain after pt stopped lasix. Baseline dry weight, per pt report is ~190 lb. Admit weight 240 lb. Diuresing well with IV Lasix and metolazone w/ an additional 2.9 L out yesterday. Net I/Os negative 7.32 L since admit.  Weight continues to trend downward, now at 224 lb. K and BP WNL however SCr rising from 1.25>1.38>>1.51. She is getting IV Lasix + PO metolazone + spironolactone. ? Holding metolazone. Will defer to MD.  Continue strict I/Os, daily weights, daily BMPs and low sodium diet. Her losartan is currently on hold. Resume once renal function improves. Once diuresed, recommend adding BB to regimen, if BP and HR allows.    2. HTN: stable this morning at 123/59. Continue current regimen.   3. CAD: s/p LAD PCI + stenting in 2015, followed by stent thrombosis after missed doses of Brilinta, requiring repeat PCI. She remains on DAPT w/ ASA + Brilinta (lower maintenance dose of 60 mg BID), along w/ statin and LA nitrate. Currently not on BB. Given her low EF, she would benefit from addition of Coreg or Toprol XL. She denies any recent CP.   4. HLD: on statin therapy with Lipitor 40 mg. Last lipid panel was 09/2017 and level was not at goal, at 82 mg/DL. Will order repeat lipid panel in the AM.   5. H/o Med Noncompliance: stressed importance of compliance.   Acute on chronic systolic CHF   LVEF 10 to 15%  Pt fired D Bensimhon in past   Baseline weight 192 lbs, admitted with 243-->232 -->224 -->219-->212  lbs Started metolazone 2.5 mg po daily with good response, I would continue for 1 more day, we will plan for a discharge tomorrow on torsemide 40 mg PO BID, spironolactone 25 mg po daily and metolazone 3x/week.   Tobias Alexander, MD  03/17/2018

## 2018-03-18 DIAGNOSIS — I255 Ischemic cardiomyopathy: Secondary | ICD-10-CM

## 2018-03-18 LAB — CREATININE, SERUM
Creatinine, Ser: 1.68 mg/dL — ABNORMAL HIGH (ref 0.44–1.00)
GFR calc Af Amer: 36 mL/min — ABNORMAL LOW (ref >60)
GFR calc non Af Amer: 31 mL/min — ABNORMAL LOW (ref >60)

## 2018-03-18 MED ORDER — METOLAZONE 2.5 MG PO TABS: 2.5000 mg | ORAL_TABLET | ORAL | 3 refills | Status: DC

## 2018-03-18 MED ORDER — ISOSORB DINITRATE-HYDRALAZINE 20-37.5 MG PO TABS: 1.0000 | ORAL_TABLET | Freq: Two times a day (BID) | ORAL | 5 refills | Status: DC

## 2018-03-18 MED ORDER — ATORVASTATIN CALCIUM 40 MG PO TABS: 40.0000 mg | ORAL_TABLET | Freq: Every day | ORAL | 5 refills | Status: DC

## 2018-03-18 MED ORDER — POTASSIUM CHLORIDE CRYS ER 20 MEQ PO TBCR: 40.0000 meq | EXTENDED_RELEASE_TABLET | Freq: Two times a day (BID) | ORAL | 3 refills | Status: DC

## 2018-03-18 MED ORDER — SPIRONOLACTONE 25 MG PO TABS: 25.0000 mg | ORAL_TABLET | Freq: Every day | ORAL | 5 refills | Status: DC

## 2018-03-18 MED ORDER — FUROSEMIDE 80 MG PO TABS: 80.0000 mg | ORAL_TABLET | Freq: Two times a day (BID) | ORAL | 5 refills | Status: DC

## 2018-03-18 NOTE — Discharge Summary (Signed)
Discharge Summary    Patient ID: Deborah Ho,  MRN: [id number], DOB/AGE: 03-09-1951 67 y.o.  Admit date: 03/10/2018 Discharge date: 03/18/2018  Primary Care Provider: Grayce Sessions Primary Cardiologist: Peter Swaziland, MD  Discharge Diagnoses    Active Problems:   Acute on chronic combined systolic and diastolic CHF (congestive heart failure) (HCC)   Allergies Allergies  Allergen Reactions  . Aspirin Swelling    Chewable children's aspirin makes patients tongue and face swell  . Effient [Prasugrel] Swelling    Patient's tongue and face swells  . Entresto [Sacubitril-Valsartan] Other (See Comments)    dizziness  . Lactose Intolerance (Gi) Other (See Comments)    REACTION: stomach upset  . Robitussin Dm [Guaifenesin-Dm] Swelling    Patient's tongue swells  . Sulfa Antibiotics Swelling  . Other     Had to replace "catgut" with clamps  . Wheat Bran Other (See Comments)    REACTION: unknown    Diagnostic Studies/Procedures    2D echo 11/21/17 Study Conclusions  - Left ventricle: The cavity size was mildly dilated. Wall   thickness was normal. The estimated ejection fraction was in the   range of 10% to 15%. Doppler parameters are consistent with   restrictive physiology, indicative of decreased left ventricular   diastolic compliance and/or increased left atrial pressure. - Mitral valve: There was mild regurgitation. - Left atrium: The atrium was mildly dilated. - Right ventricle: The cavity size was mildly dilated. Systolic   function was moderately to severely reduced. - Tricuspid valve: There was moderate regurgitation. - Pulmonary arteries: PA peak pressure: 31 mm Hg (S).  Impressions:  - Technically very limited study due to poor sound wave   transmission. There severe global LV dysfunction with moderate to   sevre RV dysfunction.    History of Present Illness     67 y.o.femalewith Hx severe LV dysfunction (LVEF 10 to 15%), CAD s/p LAD stent  in 2015, HTN, HLD, CKD and poor compliance. She presented to the [facility] ED on 03/10/18 w/ anasarca and a 3 week h/o weight gain, of nearly 40 lb, in the setting of medication noncompliance.She was noted to be volume overloaded on examination and admitted for acute on chronic systolic CHF.   Hospital Course     Pt was admitted to telemetry. Admit weight was 243 lbs. BNP was markedly elevated at 1,439. CXR showed cardiomegaly and right pleural effusion. Admit SCr was 1.18. She was placed on IV Lasix and had good diuresis, down to 204 lb. Losartan was held, but all of her home HF meds were continued. She had a slight bump in SCr with diureses, up to 1.68. Her symptoms improved with diuresis and edema resolved. She was transitioned to PO Lasix, 80 mg BID. She will continue this with 25 mg of spironolactone daily and 2.5 mg metolazone 3x/week.  We will continue to hold Losartan.  On 03/18/18, she was seen and examined by Dr. Delton See, who determined she was stable for d/c home. Early TOC f/u will be arranged and she will need a f/u BMP.   Consultants: none    Discharge Vitals Blood pressure (!) 123/92, pulse 91, temperature 97.6 F (36.4 C), temperature source Oral, resp. rate 20, height 5\' 5"  (1.651 m), weight 204 lb 8 oz (92.8 kg), SpO2 100 %.  Filed Weights   03/16/18 0500 03/17/18 0500 03/18/18 0358  Weight: 219 lb 4.8 oz (99.5 kg) 212 lb 9.6 oz (96.4 kg) 204 lb 8 oz (92.8  kg)    Labs & Radiologic Studies    CBC No results for input(s): WBC, NEUTROABS, HGB, HCT, MCV, PLT in the last 72 hours. Basic Metabolic Panel Recent Labs    86/76/72 0414 03/18/18 0624  NA 137  --   K 4.6  --   CL 98*  --   CO2 27  --   GLUCOSE 116*  --   BUN 23*  --   CREATININE 1.54* 1.68*  CALCIUM 9.8  --    Liver Function Tests No results for input(s): AST, ALT, ALKPHOS, BILITOT, PROT, ALBUMIN in the last 72 hours. No results for input(s): LIPASE, AMYLASE in the last 72 hours. Cardiac Enzymes No results for  input(s): CKTOTAL, CKMB, CKMBINDEX, TROPONINI in the last 72 hours. BNP Invalid input(s): POCBNP D-Dimer No results for input(s): DDIMER in the last 72 hours. Hemoglobin A1C No results for input(s): HGBA1C in the last 72 hours. Fasting Lipid Panel Recent Labs    03/16/18 0414  CHOL 126  HDL 31*  LDLCALC 81  TRIG 70  CHOLHDL 4.1   Thyroid Function Tests No results for input(s): TSH, T4TOTAL, T3FREE, THYROIDAB in the last 72 hours.  Invalid input(s): FREET3 _____________  Dg Chest 2 View  Result Date: 03/10/2018 CLINICAL DATA:  Shortness of breath.  Lower extremity swelling. EXAM: CHEST - 2 VIEW COMPARISON:  11/18/2017 and 09/22/2017 FINDINGS: Chronic cardiomegaly. Pulmonary vascularity is normal. Slight blunting of the right costophrenic angle posteriorly probably represents a tiny effusion. Bones are normal. Aortic atherosclerosis. IMPRESSION: 1. Chronic cardiomegaly. 2. Possible tiny right pleural effusion. 3.  Aortic Atherosclerosis (ICD10-I70.0). Electronically Signed   By: Francene Boyers M.D.   On: 03/10/2018 17:01   Disposition   Pt is being discharged home today in good condition.  Follow-up Plans & Appointments    Follow-up Information    [facility] Follow up.   Specialty:  Home Health Services Why:  A home health care nurse will see you at your home Contact information: [address] [place] [address] [place] [address] [phone]        [place], Peter M, MD Follow up.   Specialty:  Cardiology Why:  our office will call you for a hospital follow-up visit  Contact information: [address] [place] [address] [phone]          Discharge Instructions    Diet - low sodium heart healthy   Complete by:  As directed    Increase activity slowly   Complete by:  As directed       Discharge Medications   Allergies as of 03/18/2018      Reactions   Aspirin Swelling   Chewable children's aspirin makes patients tongue and face swell     Effient [prasugrel] Swelling   Patient's tongue and face swells   Entresto [sacubitril-valsartan] Other (See Comments)   dizziness   Lactose Intolerance (gi) Other (See Comments)   REACTION: stomach upset   Robitussin Dm [guaifenesin-dm] Swelling   Patient's tongue swells   Sulfa Antibiotics Swelling   Other    Had to replace "catgut" with clamps   Wheat Bran Other (See Comments)   REACTION: unknown      Medication List    STOP taking these medications   losartan 50 MG tablet Commonly known as:  COZAAR   torsemide 20 MG tablet Commonly known as:  DEMADEX     TAKE these medications   atorvastatin 40 MG tablet Commonly known as:  LIPITOR Take 1 tablet (40 mg total) by mouth daily at 6 PM. What changed:  medication strength   cetirizine 10 MG tablet Commonly known as:  ZYRTEC Take 10 mg daily by mouth.   cholecalciferol 1000 units tablet Commonly known as:  VITAMIN D Take 1,000 Units by mouth See admin instructions. Twice a month   ferrous sulfate 325 (65 FE) MG tablet Take 325 mg by mouth See admin instructions. Three times daily on Monday,Wednesday, friday   furosemide 80 MG tablet Commonly known as:  LASIX Take 1 tablet (80 mg total) by mouth 2 (two) times daily. What changed:    medication strength  how much to take  when to take this   isosorbide-hydrALAZINE 20-37.5 MG tablet Commonly known as:  BIDIL Take 1 tablet by mouth 2 (two) times daily.   magnesium oxide 400 (241.3 Mg) MG tablet Commonly known as:  MAG-OX Take 400 mg by mouth See admin instructions. Twice a month   metolazone 2.5 MG tablet Commonly known as:  ZAROXOLYN Take 1 tablet (2.5 mg total) by mouth 3 (three) times a week.   nitroGLYCERIN 0.4 MG SL tablet Commonly known as:  NITROSTAT Place 0.4 mg under the tongue every 5 (five) minutes as needed for chest pain.   potassium chloride SA 20 MEQ tablet Commonly known as:  K-DUR,KLOR-CON Take 2 tablets (40 mEq total) by mouth 2  (two) times daily. What changed:  when to take this   spironolactone 25 MG tablet Commonly known as:  ALDACTONE Take 1 tablet (25 mg total) by mouth daily.   ticagrelor 60 MG Tabs tablet Commonly known as:  BRILINTA Take 1 tablet (60 mg total) by mouth 2 (two) times daily.   traMADol 50 MG tablet Commonly known as:  ULTRAM Take 1-2 tablets (50-100 mg total) by mouth every 12 (twelve) hours as needed for moderate pain.         Outstanding Labs/Studies   TOC 7 day f/u with BMP  Duration of Discharge Encounter   Greater than 30 minutes including physician time.  Signed, Nima Kemppainen [place], PA-C 03/18/2018, 12:57 PM

## 2018-03-18 NOTE — Progress Notes (Signed)
Patient discharged for home.  She stated she had all personal belongings she brought to hospital.  No voiced complaints.

## 2018-03-18 NOTE — Progress Notes (Addendum)
Progress Note  Patient Name: Deborah Ho Date of Encounter: 03/18/2018  Primary Cardiologist: Peter Swaziland, MD  Electrophysiologist: Dr. Graciela Husbands   Subjective   She feels better, improved SOB and LE edema.   Inpatient Medications    Scheduled Meds: . atorvastatin  40 mg Oral q1800  . enoxaparin (LOVENOX) injection  40 mg Subcutaneous Daily  . ferrous sulfate  325 mg Oral 3 times per day on Mon Wed Fri  . furosemide  80 mg Intravenous BID  . isosorbide-hydrALAZINE  1 tablet Oral BID  . metolazone  2.5 mg Oral Daily  . potassium chloride  40 mEq Oral BID  . sodium chloride flush  3 mL Intravenous Q12H  . spironolactone  25 mg Oral Daily  . ticagrelor  60 mg Oral BID   Continuous Infusions: . sodium chloride     PRN Meds: sodium chloride, acetaminophen, dextromethorphan, loratadine, ondansetron (ZOFRAN) IV, pseudoephedrine, sodium chloride flush   Vital Signs    Vitals:   03/17/18 1955 03/18/18 0358 03/18/18 0611 03/18/18 0955  BP: 134/72  131/79 130/74  Pulse: 89  87 91  Resp: 20  18   Temp: 97.8 F (36.6 C)  97.8 F (36.6 C)   TempSrc: Oral  Oral   SpO2: 97%  99% 98%  Weight:  204 lb 8 oz (92.8 kg)    Height:        Intake/Output Summary (Last 24 hours) at 03/18/2018 1023 Last data filed at 03/18/2018 0955 Gross per 24 hour  Intake 120 ml  Output 3100 ml  Net -2980 ml   Filed Weights   03/16/18 0500 03/17/18 0500 03/18/18 0358  Weight: 219 lb 4.8 oz (99.5 kg) 212 lb 9.6 oz (96.4 kg) 204 lb 8 oz (92.8 kg)    Telemetry    NSR w/ low voltage - Personally Reviewed  ECG    NSR - Personally Reviewed  Physical Exam   GEN: No acute distress.  Obese  Neck: No JVD Cardiac: RRR, no murmurs, rubs, or gallops.  Respiratory: Clear to auscultation bilaterally. GI: Soft, nontender, non-distended  MS:1+ pretibial edema; No deformity. Neuro:  Nonfocal  Psych: Normal affect   Labs    Chemistry Recent Labs  Lab 03/14/18 0705 03/15/18 0626 03/16/18 0414  03/18/18 0624  NA 140 140 137  --   K 3.9 4.3 4.6  --   CL 99* 94* 98*  --   CO2 32 31 27  --   GLUCOSE 85 84 116*  --   BUN 20 19 23*  --   CREATININE 1.38* 1.51* 1.54* 1.68*  CALCIUM 9.8 10.0 9.8  --   GFRNONAA 39* 35* 34* 31*  GFRAA 45* 40* 39* 36*  ANIONGAP 9 15 12   --      Hematology No results for input(s): WBC, RBC, HGB, HCT, MCV, MCH, MCHC, RDW, PLT in the last 168 hours.  Cardiac EnzymesNo results for input(s): TROPONINI in the last 168 hours.  No results for input(s): TROPIPOC in the last 168 hours.   BNP No results for input(s): BNP, PROBNP in the last 168 hours.   DDimer No results for input(s): DDIMER in the last 168 hours.   Radiology    No results found.  Cardiac Studies   2D Echo 11/21/2017  Study Conclusions  - Left ventricle: The cavity size was mildly dilated. Wall   thickness was normal. The estimated ejection fraction was in the   range of 10% to 15%. Doppler parameters are consistent  with   restrictive physiology, indicative of decreased left ventricular   diastolic compliance and/or increased left atrial pressure. - Mitral valve: There was mild regurgitation. - Left atrium: The atrium was mildly dilated. - Right ventricle: The cavity size was mildly dilated. Systolic   function was moderately to severely reduced. - Tricuspid valve: There was moderate regurgitation. - Pulmonary arteries: PA peak pressure: 31 mm Hg (S).  Impressions:  - Technically very limited study due to poor sound wave   transmission. There severe global LV dysfunction with moderate to   sevre RV dysfunction.   Patient Profile     67 y.o. female with Hx severe LV dysfunction (LVEF 10 to 15%), CAD s/p LAD stent in 2015, HTN, HL, CKD   Presents to ED yesterday with wt gain (?50lb), tightness in chest    Not patient ran out of lasix 2 wks ago   Did not call cardiology  Was not seen by primary MD     Assessment & Plan    1. Acute on Chronic Systolic CHF:  Low EF,  10-15%. Was being followed in the AHF clinic but stopped going. Acute exacerbation 2/2 medication noncompliance. Gradual weight gain after pt stopped lasix. Baseline dry weight, per pt report is ~190 lb. Admit weight 240 lb. Diuresing well with IV Lasix and metolazone w/ an additional 2.9 L out yesterday. Net I/Os negative 7.32 L since admit.  Weight continues to trend downward, now at 224 lb. K and BP WNL however SCr rising from 1.25>1.38>>1.51. She is getting IV Lasix + PO metolazone + spironolactone. ? Holding metolazone. Will defer to MD.  Continue strict I/Os, daily weights, daily BMPs and low sodium diet. Her losartan is currently on hold. Resume once renal function improves. Once diuresed, recommend adding BB to regimen, if BP and HR allows.    2. HTN: stable this morning at 123/59. Continue current regimen.   3. CAD: s/p LAD PCI + stenting in 2015, followed by stent thrombosis after missed doses of Brilinta, requiring repeat PCI. She remains on DAPT w/ ASA + Brilinta (lower maintenance dose of 60 mg BID), along w/ statin and LA nitrate. Currently not on BB. Given her low EF, she would benefit from addition of Coreg or Toprol XL. She denies any recent CP.   4. HLD: on statin therapy with Lipitor 40 mg. Last lipid panel was 09/2017 and level was not at goal, at 82 mg/DL. Will order repeat lipid panel in the AM.   5. H/o Med Noncompliance: stressed importance of compliance.   Acute on chronic systolic CHF   LVEF 10 to 15%  Pt fired D Bensimhon in past   Baseline weight 192 lbs, admitted with 243-->232 -->224 -->219-->212-->204 lbs lbs, overall diuresis 40 lbs!!! Started metolazone 2.5 mg po daily with good response, now Crea uptrending, we will switch to PO meds, discharge today on furosemide mg PO BID, spironolactone 25 mg po daily and metolazone 3x/week. We will arrange for an early follow up in the clinic with labs BMP.   Tobias Alexander, MD  03/18/2018

## 2018-03-18 NOTE — Progress Notes (Signed)
Reviewed all discharge instructions with patient and she stated understanding.  No voiced complaints. 

## 2018-03-21 ENCOUNTER — Telehealth: Payer: Self-pay | Admitting: Cardiology

## 2018-03-21 NOTE — Telephone Encounter (Signed)
I can sign HH orders if needed.  Eugune Sine [place] MD, [facility]

## 2018-03-21 NOTE — Telephone Encounter (Signed)
Returned the call to Reunion at [facility]. She stated that the patient already has home health orders but wants to know if Dr. Sw[place]iland would be willing to sign them proceeding further. The patient does not currently have a PCP. The patient was last seen last November by Corine Shelter. Message routed to the provider.

## 2018-03-21 NOTE — Telephone Encounter (Signed)
New message     Matthias Hughs from [facility] calling, will Dr Swaziland be signing homehealth order for patient? Please call [phone] Fax [phone]

## 2018-03-21 NOTE — Telephone Encounter (Signed)
Left message for patient to call and schedule post hospital visit with Dr. [place]iland or APP within 7 days---pt wil need BMP at time of f/u

## 2018-03-22 NOTE — Telephone Encounter (Signed)
Returned call to Reunion with [facility].She will fax orders to Dr.Jordan after RN goes out to patient's home.

## 2018-03-23 ENCOUNTER — Telehealth: Payer: Self-pay | Admitting: *Deleted

## 2018-03-23 NOTE — Telephone Encounter (Signed)
No answer/ no voice mail when I called to schedule TOC visit

## 2018-03-24 ENCOUNTER — Telehealth: Payer: Self-pay

## 2018-03-24 NOTE — Telephone Encounter (Signed)
Received call from home health RN Shamil calling to schedule patient post hospital appointment.Appointment scheduled with [name] PA 04/06/18 at 11:30 am. Spoke to patient advised of post hospital appointment with [name] PA 04/06/18 at 11:30 am.

## 2018-04-04 ENCOUNTER — Telehealth: Payer: Self-pay | Admitting: Internal Medicine

## 2018-04-04 NOTE — Telephone Encounter (Signed)
Faxed signed orders to [facility] @ [phone] concerning a missed home health visit

## 2018-04-06 ENCOUNTER — Encounter: Payer: Self-pay | Admitting: Physician Assistant

## 2018-04-06 ENCOUNTER — Telehealth: Payer: Self-pay | Admitting: Physician Assistant

## 2018-04-06 ENCOUNTER — Ambulatory Visit (INDEPENDENT_AMBULATORY_CARE_PROVIDER_SITE_OTHER): Payer: Medicare Other | Admitting: Physician Assistant

## 2018-04-06 VITALS — BP 122/70 | HR 91 | Ht 65.0 in | Wt 178.0 lb

## 2018-04-06 DIAGNOSIS — R748 Abnormal levels of other serum enzymes: Secondary | ICD-10-CM | POA: Diagnosis not present

## 2018-04-06 DIAGNOSIS — E785 Hyperlipidemia, unspecified: Secondary | ICD-10-CM | POA: Diagnosis not present

## 2018-04-06 DIAGNOSIS — I10 Essential (primary) hypertension: Secondary | ICD-10-CM

## 2018-04-06 DIAGNOSIS — I5022 Chronic systolic (congestive) heart failure: Secondary | ICD-10-CM | POA: Diagnosis not present

## 2018-04-06 DIAGNOSIS — I255 Ischemic cardiomyopathy: Secondary | ICD-10-CM | POA: Diagnosis not present

## 2018-04-06 MED ORDER — SPIRONOLACTONE 25 MG PO TABS: 12.5000 mg | ORAL_TABLET | Freq: Every day | ORAL | 5 refills | Status: DC

## 2018-04-06 NOTE — Telephone Encounter (Signed)
Spoke with Deborah Glenn, NP from [facility] who states Pt had labs drawn today and results show she is in Heart Failure. Results scanned into system and will manually fax results as well to inform [name], PA. Pt has an appointment today at 1120 am. Routed to PA.

## 2018-04-06 NOTE — Telephone Encounter (Signed)
New Message   Monica,NP states that the pt had labs at her office and they came back showing that the pt is in Heart failure. She will fax results prior to appt today at 11:30 with Lisabeth Devoid

## 2018-04-06 NOTE — Telephone Encounter (Signed)
Patient seen and examined, recommendation in my note, please forward my note to [facility] NP from [facility]. Her BNP will always be elevated, she is not in acute heart failure. BNP actually improved from previous 1400 to 366.

## 2018-04-06 NOTE — Patient Instructions (Signed)
Medication Instructions:  RESTART Spironolactone 12.5mg  Take 1 tablet once a day  Labwork: Your physician recommends that you return for lab work in: 1 wk BMET  Testing/Procedures: None   Follow-Up: Your physician recommends that you schedule a follow-up appointment in: 4 Weeks at Heart Failure Clinic with Dr Shirlee Latch or his APP  Any Other Special Instructions Will Be Listed Below (If Applicable).  If you need a refill on your cardiac medications before your next appointment, please call your pharmacy.

## 2018-04-06 NOTE — Telephone Encounter (Signed)
Today's office note routed to [facility], NP

## 2018-04-06 NOTE — Progress Notes (Addendum)
Cardiology Office Note    Date:  04/06/2018   ID:  Deborah Ho, DOB [birth year], MRN [id number]  PCP:  Grayce Sessions, NP  Cardiologist:  Dr. Swaziland / Dr. Shirlee Latch - CHF clinic   Chief Complaint  Patient presents with  . Hospitalization Follow-up    seen for Dr. Shirlee Latch, recent admission for acute on chronic systolic heart failure    History of Present Illness:  Deborah Ho is a 67 y.o. female with past medical history of hypertension, hyperlipidemia, tobacco abuse, CAD, ischemic cardiomyopathy, and chronic systolic heart failure.  Patient had a history of anterior STEMI on 05/03/2014 with early in-stent thrombosis for missed dose of Brilinta, this was treated with 2 drug-eluting stents to the LAD on 05/09/2014.  Echocardiogram obtained on 05/10/2014 showed EF 45 to 50%.  However repeat echocardiogram on 08/14/2014 showed EF 15%, grade 3 DD.  Ejection fraction improved to 25% by September 2016 on echocardiogram.  Since then, her ejection fraction has been hovering between 15% to 20% on multiple echocardiogram.  Last echocardiogram was obtained during her admission for acute heart failure in January 2019, this showed EF 10 to 15%, doppler parameters consistent with restrictive physiology, moderate TR, PA peak pressure 31 mmHg.  Her last heart failure clinic visit was on November 18, 2017, her weight at the time was 231 whereas her baseline dry weight is 206 pounds.  This is significant weight gain despite increasing torsemide to 60 mg twice daily, she also refused to rechallenge Entresto.  She was directly admitted to [facility].  She was diuresed with IV Lasix and metolazone and was eventually transitioned to torsemide 80 mg twice daily.  Heart failure medication was titrated as tolerated, however patient continued to refuse aggressive titration or additional beta-blocker.  She was eventually diuresed to 16.1 L and a discharged with a new dry weight of 192 pounds.  She failed to follow-up  with heart failure clinic as outpatient and ended up back in the hospital on 03/11/2018 with weight of 243 pounds. She previously fired Dr. Gala Romney. She apparently also ran out of one of the heart failure medications well and there was also missing her Lasix dose as well.  Compliance with medication was poor.  She was eventually discharged on 04/14/2018 with dry weight of 204 pounds.  She was discharged on Lasix 80 mg twice daily, spironolactone 25 mg daily, and metolazone 3 times weekly.  She presents today for a delayed cardiology office follow-up.  Since her previous discharge, she has not been taking BiDil and spironolactone.  She has been compliant with her Lasix, metolazone and potassium supplement.  Her weight today is 178 pounds, it seems she lost another 26 pounds since her discharge earlier this month.  Lab work obtained by primary care provider on Monday showed creatinine was 1.7 consistent with her previous creatinine prior to discharge.  Note her baseline creatinine should somewhat between 1.2-1.3.  However I am okay with the current creatinine as I want to keep her on the drier side given the recurrent heart failure exacerbation.  I will restart spironolactone at 12.5 mg daily.  She says sometimes her blood pressure dropped too low for her.  On the next follow-up hopefully we can restart long-acting nitrate and hydralazine.  She could not tolerate Entresto due to dizziness.  Otherwise, it symptoms she is quite compliant with her diuretic since her previous discharge.  I will obtain a basic metabolic panel in 1 week, if renal function worsens,  I can consider potentially decrease her metolazone to twice weekly instead.  Based on lab reports sent over by her primary care provider, her BNP is 366 which has significantly decreased compared to the previous level 1400.  She will always have slightly higher than normal BMP given her EF of 15% and chronic cardiomegaly.  Otherwise she appears to be quite  euvolemic on physical exam.  Her lung is clear, there is no lower extremity edema, abdomen soft.  There is no significant JVD.  She can follow-up with heart failure clinic in 3 to 4 weeks for medication titration   Past Medical History:  Diagnosis Date  . CAD (coronary artery disease) 05/03/14; 05/09/14   a. anterior STEMI with early in-stent thorombosis for missed dose of Brillinta s/p PCI with DESx 2 into LAD (04/2014)  . CHF (congestive heart failure) (HCC)   . GERD (gastroesophageal reflux disease)    Probable  . HTN (hypertension)   . Hyperlipidemia   . Ischemic cardiomyopathy    a. 04/2014 ECHO with EF 45-50% b.  Repeat 2D echo 08/14/14 with EF down at 15%. Life vest placed  . Non compliance w medication regimen   . Tobacco use     Past Surgical History:  Procedure Laterality Date  . CHOLECYSTECTOMY    . CORONARY ANGIOPLASTY WITH STENT PLACEMENT  05/03/14   STEMI- stent to LAD DES- Xience alpine  . CORONARY ANGIOPLASTY WITH STENT PLACEMENT  05/09/14   STEMI- overlapping stent to LAD, pt had missed a dose of Brilinta  . LEFT HEART CATHETERIZATION WITH CORONARY ANGIOGRAM N/A 05/03/2014   Procedure: LEFT HEART CATHETERIZATION WITH CORONARY ANGIOGRAM;  Surgeon: Peter M Swaziland, MD;  Location: [facility] CATH LAB;  Service: Cardiovascular;  Laterality: N/A;  . LEFT HEART CATHETERIZATION WITH CORONARY ANGIOGRAM N/A 05/09/2014   Procedure: LEFT HEART CATHETERIZATION WITH CORONARY ANGIOGRAM;  Surgeon: Corky Crafts, MD;  Location: [facility] CATH LAB;  Service: Cardiovascular;  Laterality: N/A;  . PARTIAL HYSTERECTOMY    . PERCUTANEOUS STENT INTERVENTION  05/03/2014   Procedure: PERCUTANEOUS STENT INTERVENTION;  Surgeon: Peter M Swaziland, MD;  Location: [facility] CATH LAB;  Service: Cardiovascular;;  DES Prox LAD     Current Medications: Outpatient Medications Prior to Visit  Medication Sig Dispense Refill  . atorvastatin (LIPITOR) 40 MG tablet Take 1 tablet (40 mg total) by mouth daily at 6 PM. 30 tablet 5    . cetirizine (ZYRTEC) 10 MG tablet Take 10 mg daily by mouth.     . cholecalciferol (VITAMIN D) 1000 units tablet Take 1,000 Units by mouth See admin instructions. Twice a month    . ferrous sulfate 325 (65 FE) MG tablet Take 325 mg by mouth See admin instructions. Three times daily on Monday,Wednesday, friday    . furosemide (LASIX) 80 MG tablet Take 1 tablet (80 mg total) by mouth 2 (two) times daily. 60 tablet 5  . magnesium oxide (MAG-OX) 400 (241.3 Mg) MG tablet Take 400 mg by mouth See admin instructions. Twice a month    . metolazone (ZAROXOLYN) 2.5 MG tablet Take 1 tablet (2.5 mg total) by mouth 3 (three) times a week. 12 tablet 3  . nitroGLYCERIN (NITROSTAT) 0.4 MG SL tablet Place 0.4 mg under the tongue every 5 (five) minutes as needed for chest pain.    . potassium chloride SA (K-DUR,KLOR-CON) 20 MEQ tablet Take 2 tablets (40 mEq total) by mouth 2 (two) times daily. 120 tablet 3  . ticagrelor (BRILINTA) 60 MG TABS tablet Take  1 tablet (60 mg total) by mouth 2 (two) times daily. 60 tablet 0  . traMADol (ULTRAM) 50 MG tablet Take 1-2 tablets (50-100 mg total) by mouth every 12 (twelve) hours as needed for moderate pain. 30 tablet 0  . isosorbide-hydrALAZINE (BIDIL) 20-37.5 MG tablet Take 1 tablet by mouth 2 (two) times daily. 60 tablet 5  . spironolactone (ALDACTONE) 25 MG tablet Take 1 tablet (25 mg total) by mouth daily. 30 tablet 5  . fluticasone (FLONASE) 50 MCG/ACT nasal spray      No facility-administered medications prior to visit.      Allergies:   Aspirin; Effient [prasugrel]; Entresto [sacubitril-valsartan]; Lactose intolerance (gi); Robitussin dm [guaifenesin-dm]; Sulfa antibiotics; Other; and Wheat bran   Social History   Socioeconomic History  . Marital status: Widowed    Spouse name: Not on file  . Number of children: Not on file  . Years of education: Not on file  . Highest education level: Not on file  Occupational History  . 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 Financial resource strain: Not on file  . Food insecurity:    Worry: Not on file    Inability: Not on file  . Transportation needs:    Medical: Not on file    Non-medical: Not on file  Tobacco Use  . Smoking status: Current Some Day Smoker    Packs/day: 0.10    Years: 0.50    Pack years: 0.05    Types: Cigarettes  . Smokeless tobacco: Current User  . Tobacco comment: down to 3 cigarettes daily (08/03/14)  Substance and Sexual Activity  . Alcohol use: No  . Drug use: No  . Sexual activity: Not on file  Lifestyle  . Physical activity:    Days per week: Not on file    Minutes per session: Not on file  . Stress: Not on file  Relationships  . Social connections:    Talks on phone: Not on file    Gets together: Not on file    Attends religious service: Not on file    Active member of club or organization: Not on file    Attends meetings of clubs or organizations: Not on file    Relationship status: Not on file  Other Topics Concern  . Not on file  Social History Narrative   Patient has 6 brothers and sisters and none have known coronary artery disease. She lives alone in [place], but has several brothers in the area.     Family History:  The patient's family history includes Diabetes in her mother; Hypertension in her mother.   ROS:   Please see the history of present illness.    ROS All other systems reviewed and are negative.   PHYSICAL EXAM:   VS:  BP 122/70 (BP Location: Left Arm)   Pulse 91   Ht 5\' 5"  (1.651 m)   Wt 178 lb (80.7 kg)   BMI 29.62 kg/m    GEN: Well nourished, well developed, in no acute distress  HEENT: normal  Neck: no JVD, carotid bruits, or masses Cardiac: RRR; no murmurs, rubs, or gallops,no edema  Respiratory:  clear to auscultation bilaterally, normal work of breathing GI: soft, nontender, nondistended, + BS MS: no deformity or atrophy  Skin: warm and dry, no rash Neuro:  Alert and Oriented x 3, Strength and sensation are  intact Psych: euthymic mood, full affect  Wt Readings from Last 3 Encounters:  04/06/18 178 lb (80.7 kg)  03/18/18 204 lb 8 oz (92.8 kg)  11/26/17 192 lb 1.6 oz (87.1 kg)      Studies/Labs Reviewed:   EKG:  EKG is not ordered today.    Recent Labs: 11/18/2017: TSH 1.708 03/10/2018: ALT 13; B Natriuretic Peptide 1,439.5; Hemoglobin 14.6; Magnesium 1.9; Platelets 247 03/16/2018: BUN 23; Potassium 4.6; Sodium 137 03/18/2018: Creatinine, Ser 1.68   Lipid Panel    Component Value Date/Time   CHOL 126 03/16/2018 0414   TRIG 70 03/16/2018 0414   HDL 31 (L) 03/16/2018 0414   CHOLHDL 4.1 03/16/2018 0414   VLDL 14 03/16/2018 0414   LDLCALC 81 03/16/2018 0414    Additional studies/ records that were reviewed today include:   Cath 05/03/2014 PROCEDURAL FINDINGS Hemodynamics: AO 162/105 mean 130 mm Hg LV 158/26 mm Hg              Coronary angiography: Coronary dominance: right  Left mainstem: Normal  Left anterior descending (LAD): 95% proximal LAD stenosis with TIMI 2 flow.  Ramus intermediate: moderate sized vessel. Mild disease less than 20%.  Left circumflex (LCx): The LCx has 30% proximal disease. The LCx trifurcates into 3 OM branches. After the first branch there is a 60-70% stenosis in the OM prior to the next bifurcation.   Right coronary artery (RCA): The RCA arises low in the coronary cusp and was engaged with a Williams right catheter. This demonstrates a long segment of disease with 70-80% disease proximally tapering to 80-90% in the mid vessel.   Left ventriculography: Left ventricular systolic function is abnormal, LVEF is estimated at 35%, there is severe anterior hypokinesis with akinesis of the anterior apex and inferior apex. There is mild mitral regurgitation   PCI Note:  Following the diagnostic procedure, the decision was made to proceed with PCI.  Weight-based bivalirudin was given for anticoagulation. Brilinta 180 mg was given orally. Once a therapeutic  ACT was achieved, a 6 [place] XBLAD 3.5 guide catheter was inserted.  A prowater coronary guidewire was used to cross the lesion.  The lesion was predilated with a 2.0 mm balloon.  The lesion was then stented with a 3.0 x 23 mm Xience Alpine stent.  The stent was postdilated with a 3.25 mm noncompliant balloon.  Following PCI, there was 0% residual stenosis and TIMI-3 flow. Final angiography confirmed an excellent result. The patient tolerated the procedure well. There were no immediate procedural complications. A TR band was used for radial hemostasis. The patient was transferred to the post catheterization recovery area for further monitoring.  PCI Data: Vessel - LAD/Segment - proximal Percent Stenosis (pre)  95% TIMI-flow 2 Stent 3.0 x 23 mm Xience Alpine Percent Stenosis (post) 0% TIMI-flow (post) 3  Final Conclusions:   1. Severe 3 vessel obstructive CAD. Culprit lesion in the proximal LAD. 2. Severe LV dysfunction. 3. Successful stenting of the proximal LAD with a DES.   Recommendations:  Continue DAPT for one year. IV diuresis. BP control. May consider patient for enrollment in Complete trial for her residual disease.    Cath 05/09/2014 IMPRESSIONS:  1. Normal left main coronary artery. 2. Subacute stent thrombosis of the day stent in the mid left anterior descending artery.  Successful thrombectomy was performed with restoration of TIMI-3 flow. Intravascular ultrasound revealed a vessel diameter of about 3.5 mm. An overlapping stent was placed. This is a 3.0 x 23, overlapping the distal edge of the prior stent. The entire stented segment was post dilated with a 3.5 x 20 noncompliant  balloon. Intravascular ultrasound was performed again showing excellent stent apposition and large lumen diameter.. 3. Moderate disease in the left circumflex artery and its branches. 4. Severe disease in the right coronary artery. 5. Left ventricular systolic function not assessed.  LVEDP 23 mmHg.   Marland Kitchen  RECOMMENDATION:  Continue dual antiplatelet therapy. IV tirofiban will be continued as well.  Continue medical therapy for RCA disease. We stressed the importance of taking her medications as prescribed.  Continue aggressive secondary prevention. She would benefit from cardiac rehabilitation. We'll watch in the ICU.    Echo 11/21/2017 LV EF: 10% -   15%  ------------------------------------------------------------------- Indications:      CHF - 428.0.  ------------------------------------------------------------------- History:   PMH:   Coronary artery disease.  Congestive heart failure.  Cardiomyopathy of unknown etiology.  PMH:   Myocardial infarction.  Risk factors:  Current tobacco use. Hypertension. Dyslipidemia.  ------------------------------------------------------------------- Study Conclusions  - Left ventricle: The cavity size was mildly dilated. Wall   thickness was normal. The estimated ejection fraction was in the   range of 10% to 15%. Doppler parameters are consistent with   restrictive physiology, indicative of decreased left ventricular   diastolic compliance and/or increased left atrial pressure. - Mitral valve: There was mild regurgitation. - Left atrium: The atrium was mildly dilated. - Right ventricle: The cavity size was mildly dilated. Systolic   function was moderately to severely reduced. - Tricuspid valve: There was moderate regurgitation. - Pulmonary arteries: PA peak pressure: 31 mm Hg (S).  Impressions:  - Technically very limited study due to poor sound wave   transmission. There severe global LV dysfunction with moderate to   sevre RV dysfunction.  ASSESSMENT:    1. Chronic systolic heart failure (HCC)   2. Essential hypertension   3. Hyperlipidemia, unspecified hyperlipidemia type   4. Ischemic cardiomyopathy   5. Elevated alkaline phosphatase level      PLAN:  In order of problems listed above:  1. Chronic systolic heart  failure: She was recently admitted in April with acute heart failure, her admission weight was 243 pounds, her discharge weight was 204 pounds.  Since her discharge, she has managed to lose another 26 pounds, her current weight is 178 pounds.  Lab work that was obtained on Monday and faxed over by her PCP shows her creatinine is 1.7, this is essentially the same compared to her creatinine right before she left the hospital.  Her potassium is stable as well.  Her baseline creatinine is around 1.2-1.3, however I am fine with the current creatinine of 1.7 as I want to keep her on the slightly drier side given her recurrent heart failure exacerbation.  She seems to be quite compliant with diuretic at this point.  If her renal function worsens on the follow-up basic metabolic panel, I will reduce her metolazone to twice weekly dosing instead.  2. Ischemic cardiomyopathy: It is unclear to me if there is a component of nonischemic cardiomyopathy as a symptoms her ejection fraction was 45% after her heart attack in 2015, however a few months later, her ejection fraction went down to 15 to 25% and it has stayed essentially the same since then.  She had very poor restructuring of the heart.  She cannot tolerate Entresto due to side effect of dizziness.  At some point in the past she was on carvedilol, which she is not on at this point.  She was on BiDil and spironolactone, however has not been taking BiDil and spironolactone  since her recent discharge as she was told by some people not to take them and by other people to take them.  I will hold off on restarting the BiDil, however I will restart the spironolactone at the 12.5 mg daily.  She will need 1 week basic metabolic panel.  She can follow-up with heart failure clinic in 3 to 4 weeks for further medication titration and potentially to restart long-acting nitrate/hydralazine  3. Hypertension: Blood pressure stable on today's exam, however she mentioned she had some  low blood pressure after taking spironolactone.  I have restart spironolactone at half of the previous dose, I will hold off on restarting the BiDil.  She has not taken any BiDil since her recent hospital discharge.  4. Hyperlipidemia: She has been compliant with Lipitor 40 mg daily.  5. Elevated alkaline phosphatase: Lab work that was faxed today showed alkaline phosphatase of 365, AST was also mildly elevated at 41, ALT normal 24.  Will defer further work-up to primary care provider.  Given the fact that she is euvolemic, suspicion for hepatic congestion relatively low.    Medication Adjustments/Labs and Tests Ordered: Current medicines are reviewed at length with the patient today.  Concerns regarding medicines are outlined above.  Medication changes, Labs and Tests ordered today are listed in the Patient Instructions below. Patient Instructions  Medication Instructions:  RESTART Spironolactone 12.5mg  Take 1 tablet once a day  Labwork: Your physician recommends that you return for lab work in: 1 wk BMET  Testing/Procedures: None   Follow-Up: Your physician recommends that you schedule a follow-up appointment in: 4 Weeks at Heart Failure Clinic with Dr Shirlee Latch or his APP  Any Other Special Instructions Will Be Listed Below (If Applicable).  If you need a refill on your cardiac medications before your next appointment, please call your pharmacy.     Ramond Dial, [place]  04/06/2018 1:39 PM    [facility] [address] [place], [place], [place]  [address] Phone: [phone]; Fax: [phone]

## 2018-04-14 ENCOUNTER — Telehealth: Payer: Self-pay | Admitting: Cardiology

## 2018-04-14 LAB — BASIC METABOLIC PANEL
BUN/Creatinine Ratio: 23 (ref 12–28)
BUN: 36 mg/dL — ABNORMAL HIGH (ref 8–27)
CO2: 28 mmol/L (ref 20–29)
Calcium: 10.4 mg/dL — ABNORMAL HIGH (ref 8.7–10.3)
Chloride: 97 mmol/L (ref 96–106)
Creatinine, Ser: 1.56 mg/dL — ABNORMAL HIGH (ref 0.57–1.00)
GFR calc Af Amer: 40 mL/min/{1.73_m2} — ABNORMAL LOW (ref >59)
GFR calc non Af Amer: 34 mL/min/{1.73_m2} — ABNORMAL LOW (ref >59)
Glucose: 76 mg/dL (ref 65–99)
Potassium: 4.1 mmol/L (ref 3.5–5.2)
Sodium: 143 mmol/L (ref 134–144)

## 2018-04-14 NOTE — Telephone Encounter (Signed)
Returned call to [employer] home health nurse concerning patient of Dr. Swaziland  Weight gain of 2.2lbs in 24 hours (176.2 >> 178.4lbs) Patient has swelling in abdomen No ankle/lower extremity edema Patient denies SOB Patient is complaint with diuretics  Patient has frozen meals high in sodium - banquet meals, stouffer's meals. Patient is drinking carbonated soda. Tiffany educated patient on importance of low sodium diet.   Advised Tiffany I would reach out to patient and send message to MD.   _________________ Called patient to f/up on symptoms. Patient states she is not having regular BMs, so she feels backed up. She reports dietary compliance - she states she does not drink a lot of soda (maybe a Coke here and there) and that she monitors sodium in her diet. Advised to continue to weigh daily and call if weight gain of 3lbs in 24 hrs or 5lbs in 1 week and/or worsening HF symptoms.  Routed to MD

## 2018-04-14 NOTE — Telephone Encounter (Signed)
New message   Tiffany from [facility] calling to report  Pt c/o swelling: STAT is pt has developed SOB within 24 hours  1) How much weight have you gained and in what time span? 2.2lbs  2) If swelling, where is the swelling located? ABDOMEN   3) Are you currently taking a fluid pill? YES  4) Are you currently SOB? NO  5) Do you have a log of your daily weights (if so, list)?   6) Have you gained 3 pounds in a day or 5 pounds in a week?   7) Have you traveled recently? NO

## 2018-04-15 NOTE — Telephone Encounter (Signed)
Agree with recommendation  Eliz Nigg MD, FACC   

## 2018-04-15 NOTE — Progress Notes (Signed)
Renal function and electrolyte stable.

## 2018-05-03 ENCOUNTER — Encounter (HOSPITAL_COMMUNITY): Payer: Medicare Other

## 2018-05-03 ENCOUNTER — Telehealth: Payer: Self-pay | Admitting: Physician Assistant

## 2018-05-03 NOTE — Telephone Encounter (Signed)
New message   Pt c/o medication issue:  1. Name of Medication: metolazone (ZAROXOLYN) 2.5 MG tablet and Lasix 2. How are you currently taking this medication (dosage and times per day)? As written  3. Are you having a reaction (difficulty breathing--STAT)? no  4. What is your medication issue? Patient has questions on how to take medication.

## 2018-05-03 NOTE — Telephone Encounter (Signed)
Pt did not have any questions  re taking metolazone was curious who was going to follow kidney functions informed pt this will be followed by Heart Failure Clinic as this clinic will adjust fluid meds .pt  Verbalized understanding /cy

## 2018-05-23 ENCOUNTER — Inpatient Hospital Stay (HOSPITAL_COMMUNITY): Admission: RE | Admit: 2018-05-23 | Payer: Medicare Other | Source: Ambulatory Visit

## 2018-05-26 ENCOUNTER — Ambulatory Visit: Payer: Medicare Other | Admitting: Physician Assistant

## 2018-06-16 ENCOUNTER — Ambulatory Visit (INDEPENDENT_AMBULATORY_CARE_PROVIDER_SITE_OTHER): Payer: Medicare Other | Admitting: Physician Assistant

## 2018-06-16 ENCOUNTER — Encounter: Payer: Self-pay | Admitting: Physician Assistant

## 2018-06-16 ENCOUNTER — Encounter

## 2018-06-16 VITALS — BP 118/72 | HR 81 | Ht 65.5 in | Wt 189.0 lb

## 2018-06-16 DIAGNOSIS — I255 Ischemic cardiomyopathy: Secondary | ICD-10-CM

## 2018-06-16 DIAGNOSIS — I10 Essential (primary) hypertension: Secondary | ICD-10-CM | POA: Diagnosis not present

## 2018-06-16 DIAGNOSIS — E785 Hyperlipidemia, unspecified: Secondary | ICD-10-CM | POA: Diagnosis not present

## 2018-06-16 DIAGNOSIS — I5022 Chronic systolic (congestive) heart failure: Secondary | ICD-10-CM

## 2018-06-16 MED ORDER — ISOSORB DINITRATE-HYDRALAZINE 20-37.5 MG PO TABS: 1.0000 | ORAL_TABLET | Freq: Three times a day (TID) | ORAL | 6 refills | Status: DC

## 2018-06-16 NOTE — Patient Instructions (Signed)
Medication Instructions:  START Bidil 20/37.5MG  TAKE 1 TABLET THREE TIMES A DAY  Labwork: Your physician recommends that you return for lab work in: TODAY-BMET  Testing/Procedures: NONE   Follow-Up: Your physician recommends that you schedule a follow-up appointment in: 3-4 WEEKS IN HEART FAILURE CLINIC WITH DR [facility]  Any Other Special Instructions Will Be Listed Below (If Applicable). If you need a refill on your cardiac medications before your next appointment, please call your pharmacy.

## 2018-06-16 NOTE — Progress Notes (Signed)
Cardiology Office Note    Date:  06/16/2018   ID:  Deborah Ho, DOB 26-Jun-1951, MRN [id number]  PCP:  Grayce Sessions, NP  Cardiologist:  Dr. Swaziland / Dr. Shirlee Latch - CHF clinic   Chief Complaint  Patient presents with  . Follow-up    questions regarding medications    History of Present Illness:  Deborah Ho is a 67 y.o. female with past medical history of hypertension, hyperlipidemia, tobacco abuse, CAD, ischemic cardiomyopathy, and chronic systolic heart failure.  Patient had a history of anterior STEMI on 05/03/2014 with early in-stent thrombosis for missed dose of Brilinta, this was treated with 2 drug-eluting stents to the LAD on 05/09/2014.  Echocardiogram obtained on 05/10/2014 showed EF 45 to 50%.  However repeat echocardiogram on 08/14/2014 showed EF 15%, grade 3 DD.  Ejection fraction improved to 25% by September 2016 on echocardiogram.  Since then, her ejection fraction has been hovering between 15% to 20% on multiple echocardiogram.  Last echocardiogram was obtained during her admission for acute heart failure in January 2019, this showed EF 10 to 15%, doppler parameters consistent with restrictive physiology, moderate TR, PA peak pressure 31 mmHg.  Her last heart failure clinic visit was on November 18, 2017, her weight at the time was 231 whereas her baseline dry weight is 206 pounds.  This is significant weight gain despite increasing torsemide to 60 mg twice daily, she also refused to rechallenge Entresto.  She was directly admitted to [facility].  She was diuresed with IV Lasix and metolazone and was eventually transitioned to torsemide 80 mg twice daily.  Heart failure medication was titrated as tolerated, however patient continued to refuse aggressive titration or additional beta-blocker.  She was eventually diuresed to 16.1 L and a discharged with a new dry weight of 192 pounds.  She failed to follow-up with heart failure clinic as outpatient and ended up back in the  hospital on 03/11/2018 with weight of 243 pounds. She previously fired Dr. Gala Romney. She apparently also ran out of one of the heart failure medications well and there was also missing her Lasix dose as well.  Compliance with medication was poor.  She was eventually discharged on 04/14/2018 with dry weight of 204 pounds.  She was discharged on Lasix 80 mg twice daily, spironolactone 25 mg daily, and metolazone 3 times weekly.  I saw the patient for delayed post hospital follow-up on 04/06/2018.  At the time, she was not taking BiDil or her spironolactone.  She was however compliant with her Lasix and metolazone.  By the time I saw her, she has lost another 26 pounds and her weight at the time was 178 pounds.  She says she could not tolerated the Entresto due to dizziness.  I restarted her on the spironolactone 12.5 mg daily.  1 week lab work showed stable renal function and electrolyte.  I advised her to follow-up with heart failure service in 3 to 4 weeks for medication titration, however it appears she did not show up for her visit on 05/23/2018.  Patient presents today for cardiology office visit.  She has been doing well since the last visit, denies any significant orthopnea or PND.  She is not very active at home.  Her weight did increase from 178 pounds during the last office visit 289 pounds on today's visit.  However I do not see any sign of volume overload.  I suspect some of this weight may be related to lack of activity  and increased of muscle and tissue size instead of true fluid overload.  I will obtain a basic metabolic panel.  If renal function and electrolyte is okay, I think you would be worthwhile to let her take additional dose of metolazone with additional potassium this weekend as a trial to see if it will lower her weight significantly.  Overall, I will still prefer her to take metolazone 3 times weekly instead of increasing it permanently.  The extra dose of metolazone can be taken on a as  needed basis.  She denies any chest discomfort.  Given her low ejection fraction I will add BiDil to her medical regimen.  It is unclear to me whether or why she is not on the carvedilol, it is possible that this was now started due to her severely low ejection fraction due to concern of decompensating heart failure.  If she is able to tolerate BiDil, that I would recommend consideration of low-dose ARB versus ACE inhibitor.  I do not think she can tolerate Entresto anytime soon.  She is about to move to the [place] area, I would prefer her to be seen by her heart failure clinic at least one more time before she moved.     Past Medical History:  Diagnosis Date  . CAD (coronary artery disease) 05/03/14; 05/09/14   a. anterior STEMI with early in-stent thorombosis for missed dose of Brillinta s/p PCI with DESx 2 into LAD (04/2014)  . CHF (congestive heart failure) (HCC)   . GERD (gastroesophageal reflux disease)    Probable  . HTN (hypertension)   . Hyperlipidemia   . Ischemic cardiomyopathy    a. 04/2014 ECHO with EF 45-50% b.  Repeat 2D echo 08/14/14 with EF down at 15%. Life vest placed  . Non compliance w medication regimen   . Tobacco use     Past Surgical History:  Procedure Laterality Date  . CHOLECYSTECTOMY    . CORONARY ANGIOPLASTY WITH STENT PLACEMENT  05/03/14   STEMI- stent to LAD DES- Xience alpine  . CORONARY ANGIOPLASTY WITH STENT PLACEMENT  05/09/14   STEMI- overlapping stent to LAD, pt had missed a dose of Brilinta  . LEFT HEART CATHETERIZATION WITH CORONARY ANGIOGRAM N/A 05/03/2014   Procedure: LEFT HEART CATHETERIZATION WITH CORONARY ANGIOGRAM;  Surgeon: Peter M Swaziland, MD;  Location: [facility] CATH LAB;  Service: Cardiovascular;  Laterality: N/A;  . LEFT HEART CATHETERIZATION WITH CORONARY ANGIOGRAM N/A 05/09/2014   Procedure: LEFT HEART CATHETERIZATION WITH CORONARY ANGIOGRAM;  Surgeon: Corky Crafts, MD;  Location: [facility] CATH LAB;  Service: Cardiovascular;  Laterality: N/A;  .  PARTIAL HYSTERECTOMY    . PERCUTANEOUS STENT INTERVENTION  05/03/2014   Procedure: PERCUTANEOUS STENT INTERVENTION;  Surgeon: Peter M Swaziland, MD;  Location: [facility] CATH LAB;  Service: Cardiovascular;;  DES Prox LAD     Current Medications: Outpatient Medications Prior to Visit  Medication Sig Dispense Refill  . atorvastatin (LIPITOR) 40 MG tablet Take 1 tablet (40 mg total) by mouth daily at 6 PM. 30 tablet 5  . cetirizine (ZYRTEC) 10 MG tablet Take 10 mg daily by mouth.     . cholecalciferol (VITAMIN D) 1000 units tablet Take 1,000 Units by mouth See admin instructions. Twice a month    . fluticasone (FLONASE) 50 MCG/ACT nasal spray     . furosemide (LASIX) 80 MG tablet Take 1 tablet (80 mg total) by mouth 2 (two) times daily. 60 tablet 5  . magnesium oxide (MAG-OX) 400 (241.3 Mg) MG tablet  Take 400 mg by mouth See admin instructions. Twice a month    . metolazone (ZAROXOLYN) 2.5 MG tablet Take 1 tablet (2.5 mg total) by mouth 3 (three) times a week. 12 tablet 3  . nitroGLYCERIN (NITROSTAT) 0.4 MG SL tablet Place 0.4 mg under the tongue every 5 (five) minutes as needed for chest pain.    . potassium chloride SA (K-DUR,KLOR-CON) 20 MEQ tablet Take 2 tablets (40 mEq total) by mouth 2 (two) times daily. 120 tablet 3  . ticagrelor (BRILINTA) 60 MG TABS tablet Take 1 tablet (60 mg total) by mouth 2 (two) times daily. 60 tablet 0  . ferrous sulfate 325 (65 FE) MG tablet Take 325 mg by mouth See admin instructions. Three times daily on Monday,Wednesday, friday    . spironolactone (ALDACTONE) 25 MG tablet Take 0.5 tablets (12.5 mg total) by mouth daily. (Patient not taking: Reported on 06/16/2018) 30 tablet 5  . traMADol (ULTRAM) 50 MG tablet Take 1-2 tablets (50-100 mg total) by mouth every 12 (twelve) hours as needed for moderate pain. (Patient not taking: Reported on 06/16/2018) 30 tablet 0   No facility-administered medications prior to visit.      Allergies:   Aspirin; Effient [prasugrel]; Entresto  [sacubitril-valsartan]; Lactose intolerance (gi); Robitussin dm [guaifenesin-dm]; Sulfa antibiotics; Other; and Wheat bran   Social History   Socioeconomic History  . Marital status: Widowed    Spouse name: Not on file  . Number of children: Not on file  . Years of education: Not on file  . Highest education level: Not on file  Occupational History  . 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 Financial resource strain: Not on file  . Food insecurity:    Worry: Not on file    Inability: Not on file  . Transportation needs:    Medical: Not on file    Non-medical: Not on file  Tobacco Use  . Smoking status: Current Some Day Smoker    Packs/day: 0.10    Years: 0.50    Pack years: 0.05    Types: Cigarettes  . Smokeless tobacco: Current User  . Tobacco comment: down to 3 cigarettes daily (08/03/14)  Substance and Sexual Activity  . Alcohol use: No  . Drug use: No  . Sexual activity: Not on file  Lifestyle  . Physical activity:    Days per week: Not on file    Minutes per session: Not on file  . Stress: Not on file  Relationships  . Social connections:    Talks on phone: Not on file    Gets together: Not on file    Attends religious service: Not on file    Active member of club or organization: Not on file    Attends meetings of clubs or organizations: Not on file    Relationship status: Not on file  Other Topics Concern  . Not on file  Social History Narrative   Patient has 6 brothers and sisters and none have known coronary artery disease. She lives alone in [place], but has several brothers in the area.     Family History:  The patient's family history includes Diabetes in her mother; Hypertension in her mother.   ROS:   Please see the history of present illness.    ROS All other systems reviewed and are negative.   PHYSICAL EXAM:   VS:  BP 118/72 (BP Location: Left Arm, Patient Position: Sitting)   Pulse 81   Ht 5' 5.5" (1.664 m)  Wt 189 lb (85.7 kg)    SpO2 97%   BMI 30.97 kg/m    GEN: Well nourished, well developed, in no acute distress  HEENT: normal  Neck: no JVD, carotid bruits, or masses Cardiac: RRR; no murmurs, rubs, or gallops,no edema  Respiratory:  clear to auscultation bilaterally, normal work of breathing GI: soft, nontender, nondistended, + BS MS: no deformity or atrophy  Skin: warm and dry, no rash Neuro:  Alert and Oriented x 3, Strength and sensation are intact Psych: euthymic mood, full affect  Wt Readings from Last 3 Encounters:  06/16/18 189 lb (85.7 kg)  04/06/18 178 lb (80.7 kg)  03/18/18 204 lb 8 oz (92.8 kg)      Studies/Labs Reviewed:   EKG:  EKG is not ordered today.    Recent Labs: 11/18/2017: TSH 1.708 03/10/2018: ALT 13; B Natriuretic Peptide 1,439.5; Hemoglobin 14.6; Magnesium 1.9; Platelets 247 04/13/2018: BUN 36; Creatinine, Ser 1.56; Potassium 4.1; Sodium 143   Lipid Panel    Component Value Date/Time   CHOL 126 03/16/2018 0414   TRIG 70 03/16/2018 0414   HDL 31 (L) 03/16/2018 0414   CHOLHDL 4.1 03/16/2018 0414   VLDL 14 03/16/2018 0414   LDLCALC 81 03/16/2018 0414    Additional studies/ records that were reviewed today include:   Cath 05/03/2014 PROCEDURAL FINDINGS Hemodynamics: AO 162/105 mean 130 mm Hg LV 158/26 mm Hg  Coronary angiography: Coronary dominance: right  Left mainstem: Normal  Left anterior descending (LAD): 95% proximal LAD stenosis with TIMI 2 flow.  [place]amus intermediate: moderate sized vessel. Mild disease less than 20%.  Left circumflex (LCx): The LCx has 30% proximal disease. The LCx trifurcates into 3 OM branches. After the first branch there is a 60-70% stenosis in the OM prior to the next bifurcation.   Right coronary artery (RCA): The RCA arises low in the coronary cusp and was engaged with a Williams right catheter. This demonstrates a long segment of disease with 70-80% disease proximally tapering to 80-90% in the mid vessel.   Left  ventriculography: Left ventricular systolic function is abnormal, LVEF is estimated at 35%, there is severe anterior hypokinesis with akinesis of the anterior apex and inferior apex. There is mild mitral regurgitation   PCI Note: Following the diagnostic procedure, the decision was made to proceed with PCI. Weight-based bivalirudin was given for anticoagulation. Brilinta 180 mg was given orally. Once a therapeutic ACT was achieved, a 6 [place] XBLAD 3.5 guide catheter was inserted. A prowater coronary guidewire was used to cross the lesion. The lesion was predilated with a 2.0 mm balloon. The lesion was then stented with a 3.0 x 23 mm Xience Alpine stent. The stent was postdilated with a 3.25 mm noncompliant balloon. Following PCI, there was 0% residual stenosis and TIMI-3 flow. Final angiography confirmed an excellent result. The patient tolerated the procedure well. There were no immediate procedural complications. A TR band was used for radial hemostasis. The patient was transferred to the post catheterization recovery area for further monitoring.  PCI Data: Vessel - LAD/Segment - proximal Percent Stenosis (pre) 95% TIMI-flow 2 Stent 3.0 x 23 mm Xience Alpine Percent Stenosis (post) 0% TIMI-flow (post) 3  Final Conclusions:  1. Severe 3 vessel obstructive CAD. Culprit lesion in the proximal LAD. 2. Severe LV dysfunction. 3. Successful stenting of the proximal LAD with a DES.   Recommendations:  Continue DAPT for one year. IV diuresis. BP control. May consider patient for enrollment in Complete trial for her  residual disease.    Cath 05/09/2014 IMPRESSIONS:  1. Normal left main coronary artery. 2. Subacute stent thrombosis of the day stent in the mid left anterior descending artery. Successful thrombectomy was performed with restoration of TIMI-3 flow. Intravascular ultrasound revealed a vessel diameter of about 3.5 mm. An overlapping stent was placed. This is a 3.0 x 23,  overlapping the distal edge of the prior stent. The entire stented segment was post dilated with a 3.5 x 20 noncompliant balloon. Intravascular ultrasound was performed again showing excellent stent apposition and large lumen diameter.. 3. Moderate disease in the left circumflex artery and its branches. 4. Severe disease in the right coronary artery. 5. Left ventricular systolic function not assessed. LVEDP 23 mmHg. Marland Kitchen  RECOMMENDATION:Continue dual antiplatelet therapy. IV tirofiban will be continued as well. Continue medical therapy for RCA disease. We stressed the importance of taking her medications as prescribed. Continue aggressive secondary prevention. She would benefit from cardiac rehabilitation. We'll watch in the ICU.    Echo 11/21/2017 LV EF: 10% - 15%  ------------------------------------------------------------------- Indications: CHF - 428.0.  ------------------------------------------------------------------- History: PMH: Coronary artery disease. Congestive heart failure. Cardiomyopathy of unknown etiology. PMH: Myocardial infarction. Risk factors: Current tobacco use. Hypertension. Dyslipidemia.  ------------------------------------------------------------------- Study Conclusions  - Left ventricle: The cavity size was mildly dilated. Wall thickness was normal. The estimated ejection fraction was in the range of 10% to 15%. Doppler parameters are consistent with restrictive physiology, indicative of decreased left ventricular diastolic compliance and/or increased left atrial pressure. - Mitral valve: There was mild regurgitation. - Left atrium: The atrium was mildly dilated. - Right ventricle: The cavity size was mildly dilated. Systolic function was moderately to severely reduced. - Tricuspid valve: There was moderate regurgitation. - Pulmonary arteries: PA peak pressure: 31 mm Hg (S).  Impressions:  - Technically very  limited study due to poor sound wave transmission. There severe global LV dysfunction with moderate to sevre RV dysfunction.     ASSESSMENT:    1. Chronic systolic heart failure (HCC)   2. Ischemic cardiomyopathy   3. Essential hypertension   4. Hyperlipidemia, unspecified hyperlipidemia type      PLAN:  In order of problems listed above:  1. Chronic systolic heart failure: Her weight did increase by 12 pounds since the last office visit.  I am not confident this is all due to volume overload as she appears to be quite euvolemic on physical exam.  I will obtain a basic metabolic panel to assess her renal function and electrolyte since she missed her previous visit with heart failure service in July.  I will also add on BiDil to her medical regimen.  I am not sure why she is not on carvedilol, this is possibly due to her severely low ejection fraction and concerned that beta-blocker may cause further decompensation.  Will need to consider ACE inhibitor or ARB on follow-up if her blood pressures stable.  If her renal function and electrolyte is stable on today's basic metabolic panel, I will be fine if she take an extra dose of metolazone this weekend as a trial to see if her weight can be brought down.  But I suspect her weight gain is due to increased muscle/fat size.  2. Ischemic cardiomyopathy: There may be a component of nonischemic cardiomyopathy as her EF was 45% after her heart attack in 2015, however 3 months later, her ejection fraction went down to 15 to 25% and has since however in between 15 to 20% on repeat echocardiogram.  She could not tolerate Entresto due to side effect of dizziness which I suspect is due to blood pressure issues.  I restarted her spironolactone during the last office visit, I will restart BiDil as well.  3. Hypertension: Blood pressure stable, add on BiDil  4. Hyperlipidemia: On Lipitor 40 mg daily    Medication Adjustments/Labs and Tests  Ordered: Current medicines are reviewed at length with the patient today.  Concerns regarding medicines are outlined above.  Medication changes, Labs and Tests ordered today are listed in the Patient Instructions below. Patient Instructions  Medication Instructions:  START Bidil 20/37.5MG  TAKE 1 TABLET THREE TIMES A DAY  Labwork: Your physician recommends that you return for lab work in: TODAY-BMET  Testing/Procedures: NONE   Follow-Up: Your physician recommends that you schedule a follow-up appointment in: 3-4 WEEKS IN HEART FAILURE CLINIC WITH DR [facility]  Any Other Special Instructions Will Be Listed Below (If Applicable). If you need a refill on your cardiac medications before your next appointment, please call your pharmacy.     Ramond Dial, [place]  06/16/2018 9:51 AM    [facility] [address] [place], [place], [place]  [address] Phone: [phone]; Fax: [phone]

## 2018-06-17 ENCOUNTER — Other Ambulatory Visit: Payer: Self-pay | Admitting: Physician Assistant

## 2018-06-17 ENCOUNTER — Encounter: Payer: Self-pay | Admitting: Physician Assistant

## 2018-06-17 LAB — BASIC METABOLIC PANEL
BUN/Creatinine Ratio: 20 (ref 12–28)
BUN: 31 mg/dL — ABNORMAL HIGH (ref 8–27)
CO2: 29 mmol/L (ref 20–29)
Calcium: 9.5 mg/dL (ref 8.7–10.3)
Chloride: 97 mmol/L (ref 96–106)
Creatinine, Ser: 1.58 mg/dL — ABNORMAL HIGH (ref 0.57–1.00)
GFR calc Af Amer: 39 mL/min/{1.73_m2} — ABNORMAL LOW (ref >59)
GFR calc non Af Amer: 34 mL/min/{1.73_m2} — ABNORMAL LOW (ref >59)
Glucose: 98 mg/dL (ref 65–99)
Potassium: 3.5 mmol/L (ref 3.5–5.2)
Sodium: 143 mmol/L (ref 134–144)

## 2018-06-17 MED ORDER — METOLAZONE 2.5 MG PO TABS: 2.5000 mg | ORAL_TABLET | ORAL | 3 refills | Status: DC

## 2018-06-20 NOTE — Progress Notes (Signed)
Susie, can you arrange this followup appointment? Thanks.

## 2018-07-11 ENCOUNTER — Encounter (HOSPITAL_COMMUNITY): Payer: Self-pay

## 2018-07-11 ENCOUNTER — Ambulatory Visit (HOSPITAL_COMMUNITY)
Admission: RE | Admit: 2018-07-11 | Discharge: 2018-07-11 | Disposition: A | Discharge status: Home / Self Care | Payer: Medicare Other | Source: Ambulatory Visit | Attending: Cardiology | Admitting: Cardiology

## 2018-07-11 VITALS — BP 124/74 | HR 85 | Wt 181.0 lb

## 2018-07-11 DIAGNOSIS — I2510 Atherosclerotic heart disease of native coronary artery without angina pectoris: Secondary | ICD-10-CM | POA: Insufficient documentation

## 2018-07-11 DIAGNOSIS — F17210 Nicotine dependence, cigarettes, uncomplicated: Secondary | ICD-10-CM | POA: Insufficient documentation

## 2018-07-11 DIAGNOSIS — K219 Gastro-esophageal reflux disease without esophagitis: Secondary | ICD-10-CM | POA: Diagnosis not present

## 2018-07-11 DIAGNOSIS — Z79899 Other long term (current) drug therapy: Secondary | ICD-10-CM | POA: Insufficient documentation

## 2018-07-11 DIAGNOSIS — I255 Ischemic cardiomyopathy: Secondary | ICD-10-CM | POA: Insufficient documentation

## 2018-07-11 DIAGNOSIS — E785 Hyperlipidemia, unspecified: Secondary | ICD-10-CM | POA: Insufficient documentation

## 2018-07-11 DIAGNOSIS — I252 Old myocardial infarction: Secondary | ICD-10-CM | POA: Diagnosis not present

## 2018-07-11 DIAGNOSIS — I5022 Chronic systolic (congestive) heart failure: Secondary | ICD-10-CM | POA: Diagnosis not present

## 2018-07-11 DIAGNOSIS — I130 Hypertensive heart and chronic kidney disease with heart failure and stage 1 through stage 4 chronic kidney disease, or unspecified chronic kidney disease: Secondary | ICD-10-CM | POA: Insufficient documentation

## 2018-07-11 DIAGNOSIS — I10 Essential (primary) hypertension: Secondary | ICD-10-CM | POA: Diagnosis not present

## 2018-07-11 DIAGNOSIS — N183 Chronic kidney disease, stage 3 unspecified: Secondary | ICD-10-CM

## 2018-07-11 MED ORDER — HYDRALAZINE HCL 25 MG PO TABS: 25.0000 mg | ORAL_TABLET | Freq: Three times a day (TID) | ORAL | 11 refills | Status: DC

## 2018-07-11 MED ORDER — METOLAZONE 2.5 MG PO TABS: ORAL_TABLET | ORAL | 11 refills | Status: DC

## 2018-07-11 MED ORDER — SPIRONOLACTONE 25 MG PO TABS: 12.5000 mg | ORAL_TABLET | Freq: Every day | ORAL | 3 refills | Status: DC

## 2018-07-11 MED ORDER — ISOSORBIDE MONONITRATE ER 30 MG PO TB24: 15.0000 mg | ORAL_TABLET | Freq: Every day | ORAL | 11 refills | Status: DC

## 2018-07-11 NOTE — Patient Instructions (Signed)
STOP Bidil.  START Hydralazine 25 mg tablet three times daily (once every 8 hours).  START Imdur 15 mg (1/2 tablet) once every evening.  Follow up 4 months with Dr. Shirlee Latch and echocardiogram.  _________________________________________________________________________ [place] Code: 1900  Take all medication as prescribed the day of your appointment. Bring all medications with you to your appointment.  Do the following things EVERYDAY: 1) Weigh yourself in the morning before breakfast. Write it down and keep it in a log. 2) Take your medicines as prescribed 3) Eat low salt foods-Limit salt (sodium) to 2000 mg per day.  4) Stay as active as you can everyday 5) Limit all fluids for the day to less than 2 liters

## 2018-07-11 NOTE — Addendum Note (Signed)
Encounter addended by: Chyrl Civatte, RN on: 07/11/2018 11:01 AM  Actions taken: Order list changed, Diagnosis association updated

## 2018-07-11 NOTE — Progress Notes (Signed)
Patient ID: Deborah Ho, female   DOB: 1951-08-07, 67 y.o.   MRN: [id number]    Advanced Heart Failure Clinic Note   PCP: [name] Primary HF MD: Dr Shirlee Latch   HPI: Deborah Ho is a 67 y.o. female with a history of HTN, HLD, tobacco abuse, CAD s/panterior STEMI with early stent thrombosis for missing doses of Brilinta s/p PCI x 2 into LAD (04/2014) and chronic systolic HF with EF 20% by 3/16 echo.   Admitted to [facility] in 3/16 with CHF and R pleural effusion. Effusion tapped (transudative). Echo with EF down to 20%. Diuresed with IV lasix and transitioned torsemide + metolazone as needed for weight >187 pounds. Creatinine was 1.7 on the day discharge. Discharge weight 183 pounds.   Admitted 09/22/2017 with a/c systolic heart failure. Diuresed with IV lasix the transitioned to lasix 60 mg twice a day. Discharge weight 206 pounds.    Last seen in HF clinic 11/18/17. Admitted with marked volume overload. Diuresed 36 lbs with discharge weight of 192 lbs.   She presents today for regular follow up. Seen by [facility] 06/16/18 and asked to see CHF clinic once more prior to moving to [place]. She still intends to move to [place]. She has an idea for a PCP there but has not thought about cardiology coverage. She wants to establish with PCP there and speak with them. She wouldn't mind driving back here periodically for follow up. She is doing well overall. Weight down to 181 from 226 last visit (though was admitted at that visit for CHF). She is taking lasix now (from torsemide) and metolazone 3 times weekly. She has taken extra metolazone on Saturday for the past 2 weeks. Trying to watch salt and fluid more closely. Taking medications as directed. She has not started Bidil due to cost (>$300)   9/16: Cardiac MRI with EF 19%, LAD territory scar (nonviable), no thrombus.  07/25/2015: ECHO EF 25% RV normal 01/30/2015 ECHO EF 20%  11/13/2014: ECHO EF 35-40% RV normal.  02/21/16 ECHO EF 15%. Mod reduced RV  function, severe TR 09/2017 ECHO EF 20-25%. RV dilated   SH:  Social History   Socioeconomic History  . Marital status: Widowed    Spouse name: Not on file  . Number of children: Not on file  . Years of education: Not on file  . Highest education level: Not on file  Occupational History  . 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 Financial resource strain: Not on file  . Food insecurity:    Worry: Not on file    Inability: Not on file  . Transportation needs:    Medical: Not on file    Non-medical: Not on file  Tobacco Use  . Smoking status: Current Some Day Smoker    Packs/day: 0.10    Years: 0.50    Pack years: 0.05    Types: Cigarettes  . Smokeless tobacco: Current User  . Tobacco comment: down to 3 cigarettes daily (08/03/14)  Substance and Sexual Activity  . Alcohol use: No  . Drug use: No  . Sexual activity: Not on file  Lifestyle  . Physical activity:    Days per week: Not on file    Minutes per session: Not on file  . Stress: Not on file  Relationships  . Social connections:    Talks on phone: Not on file    Gets together: Not on file    Attends religious service: Not on file  Active member of club or organization: Not on file    Attends meetings of clubs or organizations: Not on file    Relationship status: Not on file  . Intimate partner violence:    Fear of current or ex partner: Not on file    Emotionally abused: Not on file    Physically abused: Not on file    Forced sexual activity: Not on file  Other Topics Concern  . Not on file  Social History Narrative   Patient has 6 brothers and sisters and none have known coronary artery disease. She lives alone in [place], but has several brothers in the area.    FH:  Family History  Problem Relation Age of Onset  . Diabetes Mother   . Hypertension Mother     Past Medical History:  Diagnosis Date  . CAD (coronary artery disease) 05/03/14; 05/09/14   a. anterior STEMI with early in-stent  thorombosis for missed dose of Brillinta s/p PCI with DESx 2 into LAD (04/2014)  . CHF (congestive heart failure) (HCC)   . GERD (gastroesophageal reflux disease)    Probable  . HTN (hypertension)   . Hyperlipidemia   . Ischemic cardiomyopathy    a. 04/2014 ECHO with EF 45-50% b.  Repeat 2D echo 08/14/14 with EF down at 15%. Life vest placed  . Non compliance w medication regimen   . Tobacco use     Current Outpatient Medications  Medication Sig Dispense Refill  . atorvastatin (LIPITOR) 40 MG tablet Take 1 tablet (40 mg total) by mouth daily at 6 PM. 30 tablet 5  . cetirizine (ZYRTEC) 10 MG tablet Take 10 mg daily by mouth.     . cholecalciferol (VITAMIN D) 1000 units tablet Take 1,000 Units by mouth See admin instructions. Twice a month    . ferrous sulfate 325 (65 FE) MG tablet Take 325 mg by mouth See admin instructions. Three times daily on Monday,Wednesday, friday    . fluticasone (FLONASE) 50 MCG/ACT nasal spray     . furosemide (LASIX) 80 MG tablet Take 1 tablet (80 mg total) by mouth 2 (two) times daily. 60 tablet 5  . magnesium oxide (MAG-OX) 400 (241.3 Mg) MG tablet Take 400 mg by mouth See admin instructions. Twice a month    . metolazone (ZAROXOLYN) 2.5 MG tablet Take 2.5 mg by mouth. 4 times weekly for 4 weeks    . nitroGLYCERIN (NITROSTAT) 0.4 MG SL tablet Place 0.4 mg under the tongue every 5 (five) minutes as needed for chest pain.    . potassium chloride SA (K-DUR,KLOR-CON) 20 MEQ tablet Take 2 tablets (40 mEq total) by mouth 2 (two) times daily. 120 tablet 3  . spironolactone (ALDACTONE) 25 MG tablet Take 0.5 tablets (12.5 mg total) by mouth daily. 30 tablet 5  . ticagrelor (BRILINTA) 60 MG TABS tablet Take 1 tablet (60 mg total) by mouth 2 (two) times daily. 60 tablet 0  . traMADol (ULTRAM) 50 MG tablet Take 1-2 tablets (50-100 mg total) by mouth every 12 (twelve) hours as needed for moderate pain. 30 tablet 0  . isosorbide-hydrALAZINE (BIDIL) 20-37.5 MG tablet Take 1  tablet by mouth 3 (three) times daily. (Patient not taking: Reported on 07/11/2018) 90 tablet 6   No current facility-administered medications for this encounter.    Vitals:   07/11/18 1021  BP: 124/74  Pulse: 85  SpO2: 98%  Weight: 82.1 kg (181 lb)     Wt Readings from Last 3 Encounters:  07/11/18  82.1 kg (181 lb)  06/16/18 85.7 kg (189 lb)  04/06/18 80.7 kg (178 lb)   PHYSICAL EXAM: General: Well appearing. No resp difficulty. HEENT: Normal Neck: Supple. JVP 5-6. Carotids 2+ bilat; no bruits. No thyromegaly or nodule noted. Cor: PMI nondisplaced. RRR, No M/G/R noted Lungs: CTAB, normal effort. Abdomen: Soft, non-tender, non-distended, no HSM. No bruits or masses. +BS  Extremities: No cyanosis, clubbing, or rash. R and LLE no edema.  Neuro: Alert & orientedx3, cranial nerves grossly intact. moves all 4 extremities w/o difficulty. Affect pleasant   ASSESSMENT & PLAN: 1. Chronic systolic CHF: Ischemic cardiomyopathy.  - ECHO 11/21/17 EF 10-15%   - NYHA II symptoms - Volume status stable on exam.   - Continue lasix 80 mg BID.  - Decrease metolazone back to 2.5 mg three times weekly. Cut back further as tolerated.  - Continue spiro 12.5 mg daily.  - Bidil > $300. Will split components and use low dose for now.  - Start hydralazine 25 mg TID.  - Start imdur 15 mg daily.  - Hold bb for now per patient request.   - Not on losartan with recent elevated renal function.  - Refuses to rechallenge entresto - Reinforced fluid restriction to < 2 L daily, sodium restriction to less than 2000 mg daily, and the importance of daily weights.   - Repeat Echo with follow up 4 months.   2. CAD:  - No s/s of ischemia.    - s/p anterior MI,  PCI x2 LAD 04/2014.  History of stent thrombosis. - Continue Brilinta 80 mg, atorvastatin 80 mg daily 3. HTN - Meds as above - Has refused to rechallenge Entresto.  4. CKD stage III - Cr stable 06/16/18   Meds as above. Refuses labs. Long discussion  about sliding scale diuretics and the fact that her heart may not get any better. She does not want to consider ICD or advanced therapies at this time. Will plan to RTC clinic with Echo in 4 months, sooner with symptoms, or as needed if she establishes with cards in [place].   Graciella Freer, PA-C  07/11/2018   Greater than 50% of the 25 minute visit was spent in counseling/coordination of care regarding disease state education, salt/fluid restriction, sliding scale diuretics, and medication compliance.

## 2018-08-23 ENCOUNTER — Encounter (HOSPITAL_COMMUNITY): Payer: Medicare Other | Admitting: Cardiology

## 2018-11-15 ENCOUNTER — Ambulatory Visit (HOSPITAL_COMMUNITY): Admission: RE | Admit: 2018-11-15 | Payer: Medicare Other | Source: Ambulatory Visit

## 2018-11-15 ENCOUNTER — Other Ambulatory Visit (HOSPITAL_COMMUNITY): Payer: Medicare Other

## 2018-11-15 ENCOUNTER — Inpatient Hospital Stay (HOSPITAL_COMMUNITY): Admission: RE | Admit: 2018-11-15 | Payer: Medicare Other | Source: Ambulatory Visit | Admitting: Cardiology

## 2018-11-27 ENCOUNTER — Encounter (HOSPITAL_COMMUNITY): Payer: Self-pay | Admitting: Emergency Medicine

## 2018-11-27 ENCOUNTER — Other Ambulatory Visit: Payer: Self-pay

## 2018-11-27 ENCOUNTER — Emergency Department (HOSPITAL_COMMUNITY): Payer: Medicare Other

## 2018-11-27 ENCOUNTER — Inpatient Hospital Stay (HOSPITAL_COMMUNITY)
Admission: EM | Admit: 2018-11-27 | Discharge: 2018-12-04 | DRG: 291 | Disposition: A | Discharge status: Home Health | Payer: Medicare Other | Attending: Internal Medicine | Admitting: Internal Medicine

## 2018-11-27 DIAGNOSIS — E669 Obesity, unspecified: Secondary | ICD-10-CM | POA: Diagnosis present

## 2018-11-27 DIAGNOSIS — Z91018 Allergy to other foods: Secondary | ICD-10-CM

## 2018-11-27 DIAGNOSIS — I2510 Atherosclerotic heart disease of native coronary artery without angina pectoris: Secondary | ICD-10-CM | POA: Diagnosis present

## 2018-11-27 DIAGNOSIS — E785 Hyperlipidemia, unspecified: Secondary | ICD-10-CM | POA: Diagnosis present

## 2018-11-27 DIAGNOSIS — I5023 Acute on chronic systolic (congestive) heart failure: Secondary | ICD-10-CM | POA: Diagnosis not present

## 2018-11-27 DIAGNOSIS — E739 Lactose intolerance, unspecified: Secondary | ICD-10-CM | POA: Diagnosis present

## 2018-11-27 DIAGNOSIS — Z7951 Long term (current) use of inhaled steroids: Secondary | ICD-10-CM

## 2018-11-27 DIAGNOSIS — Z7902 Long term (current) use of antithrombotics/antiplatelets: Secondary | ICD-10-CM

## 2018-11-27 DIAGNOSIS — N183 Chronic kidney disease, stage 3 unspecified: Secondary | ICD-10-CM | POA: Diagnosis present

## 2018-11-27 DIAGNOSIS — Z888 Allergy status to other drugs, medicaments and biological substances status: Secondary | ICD-10-CM

## 2018-11-27 DIAGNOSIS — I509 Heart failure, unspecified: Secondary | ICD-10-CM

## 2018-11-27 DIAGNOSIS — F17210 Nicotine dependence, cigarettes, uncomplicated: Secondary | ICD-10-CM | POA: Diagnosis present

## 2018-11-27 DIAGNOSIS — J40 Bronchitis, not specified as acute or chronic: Secondary | ICD-10-CM | POA: Diagnosis present

## 2018-11-27 DIAGNOSIS — Z8249 Family history of ischemic heart disease and other diseases of the circulatory system: Secondary | ICD-10-CM

## 2018-11-27 DIAGNOSIS — Z955 Presence of coronary angioplasty implant and graft: Secondary | ICD-10-CM

## 2018-11-27 DIAGNOSIS — I10 Essential (primary) hypertension: Secondary | ICD-10-CM | POA: Diagnosis not present

## 2018-11-27 DIAGNOSIS — K219 Gastro-esophageal reflux disease without esophagitis: Secondary | ICD-10-CM | POA: Diagnosis present

## 2018-11-27 DIAGNOSIS — I255 Ischemic cardiomyopathy: Secondary | ICD-10-CM | POA: Diagnosis present

## 2018-11-27 DIAGNOSIS — R0602 Shortness of breath: Secondary | ICD-10-CM | POA: Diagnosis not present

## 2018-11-27 DIAGNOSIS — Z79899 Other long term (current) drug therapy: Secondary | ICD-10-CM

## 2018-11-27 DIAGNOSIS — I2721 Secondary pulmonary arterial hypertension: Secondary | ICD-10-CM | POA: Diagnosis present

## 2018-11-27 DIAGNOSIS — I130 Hypertensive heart and chronic kidney disease with heart failure and stage 1 through stage 4 chronic kidney disease, or unspecified chronic kidney disease: Secondary | ICD-10-CM | POA: Diagnosis not present

## 2018-11-27 DIAGNOSIS — I252 Old myocardial infarction: Secondary | ICD-10-CM

## 2018-11-27 DIAGNOSIS — Z90711 Acquired absence of uterus with remaining cervical stump: Secondary | ICD-10-CM

## 2018-11-27 DIAGNOSIS — Z882 Allergy status to sulfonamides status: Secondary | ICD-10-CM

## 2018-11-27 DIAGNOSIS — Z6835 Body mass index (BMI) 35.0-35.9, adult: Secondary | ICD-10-CM

## 2018-11-27 DIAGNOSIS — Z9049 Acquired absence of other specified parts of digestive tract: Secondary | ICD-10-CM

## 2018-11-27 DIAGNOSIS — N3000 Acute cystitis without hematuria: Secondary | ICD-10-CM | POA: Diagnosis present

## 2018-11-27 LAB — URINALYSIS, ROUTINE W REFLEX MICROSCOPIC
Bilirubin Urine: NEGATIVE
Glucose, UA: NEGATIVE mg/dL
Ketones, ur: NEGATIVE mg/dL
Nitrite: NEGATIVE
Protein, ur: >=300 mg/dL — AB
Specific Gravity, Urine: 1.026 (ref 1.005–1.030)
pH: 5 (ref 5.0–8.0)

## 2018-11-27 LAB — COMPREHENSIVE METABOLIC PANEL
ALT: 68 U/L — ABNORMAL HIGH (ref 0–44)
AST: 26 U/L (ref 15–41)
Albumin: 3.8 g/dL (ref 3.5–5.0)
Alkaline Phosphatase: 145 U/L — ABNORMAL HIGH (ref 38–126)
Anion gap: 10 (ref 5–15)
BUN: 17 mg/dL (ref 8–23)
CO2: 24 mmol/L (ref 22–32)
Calcium: 9.1 mg/dL (ref 8.9–10.3)
Chloride: 112 mmol/L — ABNORMAL HIGH (ref 98–111)
Creatinine, Ser: 1.39 mg/dL — ABNORMAL HIGH (ref 0.44–1.00)
GFR calc Af Amer: 45 mL/min — ABNORMAL LOW (ref >60)
GFR calc non Af Amer: 39 mL/min — ABNORMAL LOW (ref >60)
Glucose, Bld: 101 mg/dL — ABNORMAL HIGH (ref 70–99)
Potassium: 4 mmol/L (ref 3.5–5.1)
Sodium: 146 mmol/L — ABNORMAL HIGH (ref 135–145)
Total Bilirubin: 1.9 mg/dL — ABNORMAL HIGH (ref 0.3–1.2)
Total Protein: 7.1 g/dL (ref 6.5–8.1)

## 2018-11-27 LAB — CBC WITH DIFFERENTIAL/PLATELET
Abs Immature Granulocytes: 0.05 10*3/uL (ref 0.00–0.07)
Basophils Absolute: 0.1 10*3/uL (ref 0.0–0.1)
Basophils Relative: 1 %
Eosinophils Absolute: 0.1 10*3/uL (ref 0.0–0.5)
Eosinophils Relative: 2 %
HCT: 46.2 % — ABNORMAL HIGH (ref 36.0–46.0)
Hemoglobin: 14.1 g/dL (ref 12.0–15.0)
Immature Granulocytes: 1 %
Lymphocytes Relative: 28 %
Lymphs Abs: 2.2 10*3/uL (ref 0.7–4.0)
MCH: 30.3 pg (ref 26.0–34.0)
MCHC: 30.5 g/dL (ref 30.0–36.0)
MCV: 99.1 fL (ref 80.0–100.0)
Monocytes Absolute: 0.7 10*3/uL (ref 0.1–1.0)
Monocytes Relative: 8 %
Neutro Abs: 4.9 10*3/uL (ref 1.7–7.7)
Neutrophils Relative %: 60 %
Platelets: 312 10*3/uL (ref 150–400)
RBC: 4.66 MIL/uL (ref 3.87–5.11)
RDW: 14.9 % (ref 11.5–15.5)
WBC: 8 10*3/uL (ref 4.0–10.5)
nRBC: 0 % (ref 0.0–0.2)

## 2018-11-27 LAB — BRAIN NATRIURETIC PEPTIDE: B Natriuretic Peptide: 1435 pg/mL — ABNORMAL HIGH (ref 0.0–100.0)

## 2018-11-27 LAB — TROPONIN I: Troponin I: <0.03 ng/mL (ref <0.03)

## 2018-11-27 MED ORDER — ISOSORBIDE MONONITRATE ER 30 MG PO TB24
15.0000 mg | ORAL_TABLET | Freq: Every day | ORAL | Status: DC
  Administered 2018-11-28 – 2018-12-01 (×4): 15 mg via ORAL
  Filled 2018-11-27 (×4): qty 1

## 2018-11-27 MED ORDER — FUROSEMIDE 10 MG/ML IJ SOLN
40.0000 mg | Freq: Once | INTRAMUSCULAR | Status: AC
  Administered 2018-11-27: 40 mg via INTRAVENOUS
  Filled 2018-11-27: qty 4

## 2018-11-27 MED ORDER — IPRATROPIUM-ALBUTEROL 0.5-2.5 (3) MG/3ML IN SOLN
3.0000 mL | Freq: Once | RESPIRATORY_TRACT | Status: AC
  Administered 2018-11-27: 3 mL via RESPIRATORY_TRACT
  Filled 2018-11-27: qty 3

## 2018-11-27 MED ORDER — HYDRALAZINE HCL 25 MG PO TABS
25.0000 mg | ORAL_TABLET | Freq: Three times a day (TID) | ORAL | Status: DC
  Administered 2018-11-27 – 2018-12-04 (×19): 25 mg via ORAL
  Filled 2018-11-27 (×20): qty 1

## 2018-11-27 MED ORDER — FLUTICASONE PROPIONATE 50 MCG/ACT NA SUSP
1.0000 | Freq: Every day | NASAL | Status: DC | PRN
  Filled 2018-11-27: qty 16

## 2018-11-27 MED ORDER — METOLAZONE 2.5 MG PO TABS
2.5000 mg | ORAL_TABLET | Freq: Once | ORAL | Status: AC
  Administered 2018-11-27: 2.5 mg via ORAL
  Filled 2018-11-27: qty 1

## 2018-11-27 MED ORDER — ONDANSETRON HCL 4 MG/2ML IJ SOLN: 4.0000 mg | Freq: Four times a day (QID) | INTRAMUSCULAR | Status: DC | PRN

## 2018-11-27 MED ORDER — SODIUM CHLORIDE 0.9% FLUSH
3.0000 mL | INTRAVENOUS | Status: DC | PRN
  Administered 2018-12-03: 3 mL via INTRAVENOUS
  Filled 2018-11-27: qty 3

## 2018-11-27 MED ORDER — SPIRONOLACTONE 12.5 MG HALF TABLET
12.5000 mg | ORAL_TABLET | Freq: Every day | ORAL | Status: DC
  Administered 2018-11-28 – 2018-11-30 (×3): 12.5 mg via ORAL
  Filled 2018-11-27 (×3): qty 1

## 2018-11-27 MED ORDER — CARVEDILOL 6.25 MG PO TABS
6.2500 mg | ORAL_TABLET | Freq: Every day | ORAL | Status: DC
  Administered 2018-11-28: 6.25 mg via ORAL
  Filled 2018-11-27: qty 1

## 2018-11-27 MED ORDER — ATORVASTATIN CALCIUM 40 MG PO TABS
40.0000 mg | ORAL_TABLET | Freq: Every day | ORAL | Status: DC
  Administered 2018-11-28 – 2018-12-03 (×6): 40 mg via ORAL
  Filled 2018-11-27 (×6): qty 1

## 2018-11-27 MED ORDER — ALBUTEROL SULFATE (2.5 MG/3ML) 0.083% IN NEBU: 2.5000 mg | INHALATION_SOLUTION | RESPIRATORY_TRACT | Status: DC | PRN

## 2018-11-27 MED ORDER — SODIUM CHLORIDE 0.9 % IV SOLN: 250.0000 mL | INTRAVENOUS | Status: DC | PRN

## 2018-11-27 MED ORDER — FERROUS SULFATE 325 (65 FE) MG PO TABS
325.0000 mg | ORAL_TABLET | ORAL | Status: DC
  Administered 2018-11-28 – 2018-12-02 (×7): 325 mg via ORAL
  Filled 2018-11-27 (×7): qty 1

## 2018-11-27 MED ORDER — SODIUM CHLORIDE 0.9 % IV SOLN
1.0000 g | Freq: Once | INTRAVENOUS | Status: AC
  Administered 2018-11-27: 1 g via INTRAVENOUS
  Filled 2018-11-27: qty 10

## 2018-11-27 MED ORDER — ENOXAPARIN SODIUM 40 MG/0.4ML ~~LOC~~ SOLN
40.0000 mg | SUBCUTANEOUS | Status: DC
  Administered 2018-12-01 – 2018-12-03 (×2): 40 mg via SUBCUTANEOUS
  Filled 2018-11-27 (×7): qty 0.4

## 2018-11-27 MED ORDER — SODIUM CHLORIDE 0.9% FLUSH
3.0000 mL | Freq: Two times a day (BID) | INTRAVENOUS | Status: DC
  Administered 2018-11-28 – 2018-12-04 (×12): 3 mL via INTRAVENOUS

## 2018-11-27 MED ORDER — POTASSIUM CHLORIDE CRYS ER 20 MEQ PO TBCR
40.0000 meq | EXTENDED_RELEASE_TABLET | Freq: Two times a day (BID) | ORAL | Status: DC
  Administered 2018-11-28 – 2018-12-03 (×12): 40 meq via ORAL
  Filled 2018-11-27 (×12): qty 2

## 2018-11-27 MED ORDER — ACETAMINOPHEN 325 MG PO TABS
650.0000 mg | ORAL_TABLET | ORAL | Status: DC | PRN
  Administered 2018-12-03: 650 mg via ORAL
  Filled 2018-11-27: qty 2

## 2018-11-27 MED ORDER — TICAGRELOR 60 MG PO TABS
60.0000 mg | ORAL_TABLET | Freq: Two times a day (BID) | ORAL | Status: DC
  Administered 2018-11-27 – 2018-12-04 (×14): 60 mg via ORAL
  Filled 2018-11-27 (×15): qty 1

## 2018-11-27 MED ORDER — FUROSEMIDE 10 MG/ML IJ SOLN
80.0000 mg | Freq: Two times a day (BID) | INTRAMUSCULAR | Status: DC
  Administered 2018-11-28 – 2018-12-02 (×9): 80 mg via INTRAVENOUS
  Filled 2018-11-27 (×9): qty 8

## 2018-11-27 NOTE — ED Notes (Signed)
ED TO INPATIENT HANDOFF REPORT  Name/Age/Gender Deborah NuttingPatsy Ho 68 y.o. female  Code Status    Code Status Orders  (From admission, onward)         Start     Ordered   11/27/18 2153  Full code  Continuous     11/27/18 2156        Code Status History    Date Active Date Inactive Code Status Order ID Comments User Context   03/11/2018 0314 03/18/2018 1838 Full Code [id number]  Oliva BustardLoring, Zak A, MD ED   11/18/2017 1656 11/26/2017 1801 Full Code [id number]  Sherald Hesslegg, Amy D, NP Inpatient   09/23/2017 0054 09/29/2017 1739 Full Code [id number]  Briscoe Deutscherpyd, Timothy S, MD ED   03/26/2017 2044 04/02/2017 2105 Full Code [id number]  Glade LloydAlekh, Kshitiz, MD Inpatient   02/18/2017 2035 02/21/2017 1852 Full Code [id number]  Inez CatalinaMullen, Emily B, MD ED   01/28/2017 2226 02/06/2017 1842 Full Code [id number]  Danford, Earl Liteshristopher P, MD Inpatient   06/26/2016 2049 07/06/2016 1744 Full Code [id number]  Haydee SalterHobbs, Phillip M, MD Inpatient   02/20/2016 1820 02/25/2016 1749 Full Code [id number]  Sherald Hesslegg, Amy D, NP Inpatient   08/12/2015 1615 08/19/2015 1813 Full Code [id number]  Sherald Hesslegg, Amy D, NP Inpatient   01/29/2015 1258 02/05/2015 1839 Full Code [id number]  Eddie Northhungel, Nishant, MD ED   09/14/2014 0203 09/19/2014 1846 Full Code [id number]  Ardis RowanWhitlock, Matthew, MD Inpatient   08/13/2014 2311 08/17/2014 1844 Full Code [id number]  Azalee CourseMeng, Hao, PA Inpatient   05/09/2014 1321 05/12/2014 2110 Full Code [id number]  Dwana MelenaHager, Bryan W, PA-C Inpatient   05/09/2014 1253 05/09/2014 1321 Full Code [id number]  Corky CraftsVaranasi, Jayadeep S, MD Inpatient   05/03/2014 2105 05/06/2014 1335 Full Code [id number]  SwazilandJordan, Peter M, MD Inpatient   05/03/2014 2105 05/03/2014 2105 Full Code [id number]  Barrett, Joline Salthonda G, PA-C Inpatient      Home/SNF/Other Home  Chief Complaint malaise; cold  Level of Care/Admitting Diagnosis ED Disposition    ED Disposition Condition Comment   Admit  [facility] [[id number]]  Level of Care: Telemetry [5]  Admit to tele based on following  criteria: Acute CHF  Diagnosis: Acute on chronic systolic CHF (congestive heart failure) [facility](HCC) [[id number]]) [[id number]]  Admitting Physician: Hillary BowGARDNER, JARED M [phone][[id number]]  Attending Physician: Hillary BowGARDNER, JARED M [[id number]]  PT Class (Do Not Modify): Observation [104]  PT Acc Code (Do Not Modify): Observation [10022]       Medical History Past Medical History:  Diagnosis Date  . CAD (coronary artery disease) 05/03/14; 05/09/14   a. anterior STEMI with early in-stent thorombosis for missed dose of Brillinta s/p PCI with DESx 2 into LAD (04/2014)  . CHF (congestive heart failure) (HCC)   . GERD (gastroesophageal reflux disease)    Probable  . HTN (hypertension)   . Hyperlipidemia   . Ischemic cardiomyopathy    a. 04/2014 ECHO with EF 45-50% b.  Repeat 2D echo 08/14/14 with EF down at 15%. Life vest placed  . Non compliance w medication regimen   . Tobacco use     Allergies Allergies  Allergen Reactions  . Aspirin Swelling    Chewable children's aspirin makes patients tongue and face swell  . Effient [Prasugrel] Swelling    Patient's tongue and face swells  . Entresto [Sacubitril-Valsartan] Other (See Comments)    dizziness  . Lactose Intolerance (Gi) Other (See Comments)    REACTION: stomach upset  . Robitussin Dm [Guaifenesin-Dm] Swelling    Patient's tongue swells  . Sulfa Antibiotics  Swelling  . Other     Had to replace "catgut" with clamps  . Wheat Bran Other (See Comments)    REACTION: unknown    IV Location/Drains/Wounds Patient Lines/Drains/Airways Status   Active Line/Drains/Airways    Name:   Placement date:   Placement time:   Site:   Days:   Peripheral IV 11/27/18 Right;Anterior Forearm   11/27/18    2152    Forearm   less than 1   Incision (Closed) 01/28/17 Leg Left;Posterior   01/28/17    2300     668   Wound / Incision (Open or Dehisced) 02/18/17 Venous stasis ulcer Leg Left Open Ulcer on left posterior leg   02/18/17    2130    Leg   647          Labs/Imaging Results for orders  placed or performed during the hospital encounter of 11/27/18 (from the past 48 hour(s))  CBC with Differential     Status: Abnormal   Collection Time: 11/27/18  4:07 PM  Result Value Ref Range   WBC 8.0 4.0 - 10.5 K/uL   RBC 4.66 3.87 - 5.11 MIL/uL   Hemoglobin 14.1 12.0 - 15.0 g/dL   HCT 75.6 (H) 43.3 - 29.5 %   MCV 99.1 80.0 - 100.0 fL   MCH 30.3 26.0 - 34.0 pg   MCHC 30.5 30.0 - 36.0 g/dL   RDW 18.8 41.6 - 60.6 %   Platelets 312 150 - 400 K/uL   nRBC 0.0 0.0 - 0.2 %   Neutrophils Relative % 60 %   Neutro Abs 4.9 1.7 - 7.7 K/uL   Lymphocytes Relative 28 %   Lymphs Abs 2.2 0.7 - 4.0 K/uL   Monocytes Relative 8 %   Monocytes Absolute 0.7 0.1 - 1.0 K/uL   Eosinophils Relative 2 %   Eosinophils Absolute 0.1 0.0 - 0.5 K/uL   Basophils Relative 1 %   Basophils Absolute 0.1 0.0 - 0.1 K/uL   Immature Granulocytes 1 %   Abs Immature Granulocytes 0.05 0.00 - 0.07 K/uL    Comment: Performed at [facility], [address], [place], [place] [address]  Brain natriuretic peptide     Status: Abnormal   Collection Time: 11/27/18  4:07 PM  Result Value Ref Range   B Natriuretic Peptide 1,435.0 (H) 0.0 - 100.0 pg/mL    Comment: Performed at [facility], [address], [place], [place] [address]  Troponin I - ONCE - STAT     Status: None   Collection Time: 11/27/18  4:07 PM  Result Value Ref Range   Troponin I <0.03 <0.03 ng/mL    Comment: Performed at [facility], [address], [place], [place] [address]  Urinalysis, Routine w reflex microscopic     Status: Abnormal   Collection Time: 11/27/18  4:08 PM  Result Value Ref Range   Color, Urine AMBER (A) YELLOW    Comment: BIOCHEMICALS MAY BE AFFECTED BY COLOR   APPearance HAZY (A) CLEAR   Specific Gravity, Urine 1.026 1.005 - 1.030   pH 5.0 5.0 - 8.0   Glucose, UA NEGATIVE NEGATIVE mg/dL   Hgb urine dipstick SMALL (A) NEGATIVE   Bilirubin Urine NEGATIVE NEGATIVE   Ketones, ur  NEGATIVE NEGATIVE mg/dL   Protein, ur >=220 (A) NEGATIVE mg/dL   Nitrite NEGATIVE NEGATIVE   Leukocytes, UA TRACE (A) NEGATIVE   RBC / HPF 0-5 0 - 5 RBC/hpf   WBC,  UA 6-10 0 - 5 WBC/hpf   Bacteria, UA MANY (A) NONE SEEN   Squamous Epithelial / LPF 6-10 0 - 5   Mucus PRESENT    Hyaline Casts, UA PRESENT     Comment: Performed at [facility], [address] [place], [place] [address]  Comprehensive metabolic panel     Status: Abnormal   Collection Time: 11/27/18  5:50 PM  Result Value Ref Range   Sodium 146 (H) 135 - 145 mmol/L   Potassium 4.0 3.5 - 5.1 mmol/L   Chloride 112 (H) 98 - 111 mmol/L   CO2 24 22 - 32 mmol/L   Glucose, Bld 101 (H) 70 - 99 mg/dL   BUN 17 8 - 23 mg/dL   Creatinine, Ser 1.191.39 (H) 0.44 - 1.00 mg/dL   Calcium 9.1 8.9 - 14.710.3 mg/dL   Total Protein 7.1 6.5 - 8.1 g/dL   Albumin 3.8 3.5 - 5.0 g/dL   AST 26 15 - 41 U/L   ALT 68 (H) 0 - 44 U/L   Alkaline Phosphatase 145 (H) 38 - 126 U/L   Total Bilirubin 1.9 (H) 0.3 - 1.2 mg/dL   GFR calc non Af Amer 39 (L) >60 mL/min   GFR calc Af Amer 45 (L) >60 mL/min   Anion gap 10 5 - 15    Comment: Performed at [facility], [address] [place], [place] [address]   Dg Chest 2 View  Result Date: 11/27/2018 CLINICAL DATA:  Productive cough with fatigue for 2 weeks. History of congestive heart failure and hypertension. EXAM: CHEST - 2 VIEW COMPARISON:  03/10/2018 and 11/18/2017 radiographs. FINDINGS: Stable cardiomegaly, chronic vascular congestion and blunting of the right costophrenic angle. No airspace disease, enlarging pleural effusion or pneumothorax. The bones appear unchanged. IMPRESSION: Stable chronic findings including cardiomegaly and vascular congestion. No evidence of pneumonia. Electronically Signed   By: Carey BullocksWilliam  Veazey M.D.   On: 11/27/2018 15:09    Pending Labs Unresulted Labs (From admission, onward)    Start     Ordered   11/28/18 0500  Basic metabolic panel   Daily,   R     11/27/18 2156   11/27/18 2152  HIV antibody (Routine Testing)  Once,   R     11/27/18 2156   11/27/18 1530  Urine culture  ONCE - STAT,   STAT     11/27/18 1529          Vitals/Pain Today's Vitals   11/27/18 1354 11/27/18 1700 11/27/18 2200  BP: (!) 151/103 (!) 151/110 (!) 163/99  Pulse: 93 88 88  Resp: 19 (!) 31 18  Temp: 98.1 F (36.7 C)    TempSrc: Oral    SpO2: 98% 96% 97%    Isolation Precautions No active isolations  Medications Medications  furosemide (LASIX) injection 40 mg (has no administration in time range)  cefTRIAXone (ROCEPHIN) 1 g in sodium chloride 0.9 % 100 mL IVPB (has no administration in time range)  sodium chloride flush (NS) 0.9 % injection 3 mL (has no administration in time range)  sodium chloride flush (NS) 0.9 % injection 3 mL (has no administration in time range)  0.9 %  sodium chloride infusion (has no administration in time range)  acetaminophen (TYLENOL) tablet 650 mg (has no administration in time range)  ondansetron (ZOFRAN) injection 4 mg (has no administration in time range)  enoxaparin (LOVENOX) injection 40 mg (has no administration in time range)  furosemide (LASIX) injection 80  mg (has no administration in time range)  atorvastatin (LIPITOR) tablet 40 mg (has no administration in time range)  fluticasone (FLONASE) 50 MCG/ACT nasal spray 1 spray (has no administration in time range)  ferrous sulfate tablet 325 mg (has no administration in time range)  hydrALAZINE (APRESOLINE) tablet 25 mg (has no administration in time range)  isosorbide mononitrate (IMDUR) 24 hr tablet 15 mg (has no administration in time range)  potassium chloride SA (K-DUR,KLOR-CON) CR tablet 40 mEq (has no administration in time range)  spironolactone (ALDACTONE) tablet 12.5 mg (has no administration in time range)  ticagrelor (BRILINTA) tablet 60 mg (has no administration in time range)  metolazone (ZAROXOLYN) tablet 2.5 mg (has no administration  in time range)  carvedilol (COREG) tablet 6.25 mg (has no administration in time range)  ipratropium-albuterol (DUONEB) 0.5-2.5 (3) MG/3ML nebulizer solution 3 mL (3 mLs Nebulization Given 11/27/18 1557)  ipratropium-albuterol (DUONEB) 0.5-2.5 (3) MG/3ML nebulizer solution 3 mL (3 mLs Nebulization Given 11/27/18 1712)    Mobility walks

## 2018-11-27 NOTE — ED Triage Notes (Signed)
Per EMS, patient from home, c/o productive cough and fatigue x2 weeks. Taking OTC meds without relief. Ambulatory.  BP 169/111 O2 96%  HR 92

## 2018-11-27 NOTE — ED Notes (Addendum)
Pt requesting to wait until she is upstairs to receive Lasix

## 2018-11-27 NOTE — ED Notes (Signed)
EKG given to EDP,Tegeler,MD., for review. 

## 2018-11-27 NOTE — ED Notes (Signed)
Pt. Ambulated down the hall and back to her room on 98% on room air without assistance. Pt. Gait steady on her feet.

## 2018-11-27 NOTE — H&P (Signed)
History and Physical    Deborah Ho FMB:[id number] DOB: Jun 17, 1951 DOA: 11/27/2018  PCP: Grayce Sessions, NP  Patient coming from: Home  I have personally briefly reviewed patient's old medical records in [facility]  Chief Complaint: SOB, cough  HPI: Deborah Ho is a 68 y.o. female with medical history significant of ICM EF 15%, HTN.  Patient presents to the ED with c/o severe SOB and cough.  Onset over past 2 weeks.  Associated cough and wheezing.  Worse with activity.  No fevers, no chest pain.    Patient says that she had more congestion rhinorrhea and chills 2 weeks ago but that has improved but she is still having persistent cough.  Developed SOB.  No N/V.   ED Course: CXR shows B pulm vasc congestion.  BNP 1435.  Given 80mg  lasix in ED.   Review of Systems: As per HPI otherwise 10 point review of systems negative.   Past Medical History:  Diagnosis Date  . CAD (coronary artery disease) 05/03/14; 05/09/14   a. anterior STEMI with early in-stent thorombosis for missed dose of Brillinta s/p PCI with DESx 2 into LAD (04/2014)  . CHF (congestive heart failure) (HCC)   . GERD (gastroesophageal reflux disease)    Probable  . HTN (hypertension)   . Hyperlipidemia   . Ischemic cardiomyopathy    a. 04/2014 ECHO with EF 45-50% b.  Repeat 2D echo 08/14/14 with EF down at 15%. Life vest placed  . Non compliance w medication regimen   . Tobacco use     Past Surgical History:  Procedure Laterality Date  . CHOLECYSTECTOMY    . CORONARY ANGIOPLASTY WITH STENT PLACEMENT  05/03/14   STEMI- stent to LAD DES- Xience alpine  . CORONARY ANGIOPLASTY WITH STENT PLACEMENT  05/09/14   STEMI- overlapping stent to LAD, pt had missed a dose of Brilinta  . LEFT HEART CATHETERIZATION WITH CORONARY ANGIOGRAM N/A 05/03/2014   Procedure: LEFT HEART CATHETERIZATION WITH CORONARY ANGIOGRAM;  Surgeon: Peter M Swaziland, MD;  Location: [facility] CATH LAB;  Service: Cardiovascular;  Laterality: N/A;  .  LEFT HEART CATHETERIZATION WITH CORONARY ANGIOGRAM N/A 05/09/2014   Procedure: LEFT HEART CATHETERIZATION WITH CORONARY ANGIOGRAM;  Surgeon: Corky Crafts, MD;  Location: [facility] CATH LAB;  Service: Cardiovascular;  Laterality: N/A;  . PARTIAL HYSTERECTOMY    . PERCUTANEOUS STENT INTERVENTION  05/03/2014   Procedure: PERCUTANEOUS STENT INTERVENTION;  Surgeon: Peter M Swaziland, MD;  Location: [facility] CATH LAB;  Service: Cardiovascular;;  DES Prox LAD      reports that she has been smoking cigarettes. She has a 0.05 pack-year smoking history. She uses smokeless tobacco. She reports that she does not drink alcohol or use drugs.  Allergies  Allergen Reactions  . Aspirin Swelling    Chewable children's aspirin makes patients tongue and face swell  . Effient [Prasugrel] Swelling    Patient's tongue and face swells  . Entresto [Sacubitril-Valsartan] Other (See Comments)    dizziness  . Lactose Intolerance (Gi) Other (See Comments)    REACTION: stomach upset  . Robitussin Dm [Guaifenesin-Dm] Swelling    Patient's tongue swells  . Sulfa Antibiotics Swelling  . Other     Had to replace "catgut" with clamps  . Wheat Bran Other (See Comments)    REACTION: unknown    Family History  Problem Relation Age of Onset  . Diabetes Mother   . Hypertension Mother      Prior to Admission medications   Medication Sig  Start Date End Date Taking? Authorizing Provider  atorvastatin (LIPITOR) 40 MG tablet Take 1 tablet (40 mg total) by mouth daily at 6 PM. 03/18/18  Yes Robbie Lis M, PA-C  carvedilol (COREG) 6.25 MG tablet Take 6.25 mg by mouth daily.   Yes [provider]  cetirizine (ZYRTEC) 10 MG tablet Take 10 mg daily by mouth.    Yes [provider]  cholecalciferol (VITAMIN D) 1000 units tablet Take 1,000 Units by mouth See admin instructions. Twice a month   Yes [provider]  ferrous sulfate 325 (65 FE) MG tablet Take 325 mg by mouth See admin instructions. Three  times daily on Monday,Wednesday, friday   Yes [provider]  fluticasone (FLONASE) 50 MCG/ACT nasal spray Place 1 spray into both nostrils daily as needed for allergies (congestion).  04/04/18  Yes [provider]  furosemide (LASIX) 80 MG tablet Take 1 tablet (80 mg total) by mouth 2 (two) times daily. 03/18/18  Yes Robbie Lis M, PA-C  hydrALAZINE (APRESOLINE) 25 MG tablet Take 1 tablet (25 mg total) by mouth 3 (three) times daily. 07/11/18 11/27/18 Yes Tillery, Mariam Dollar, PA-C  isosorbide mononitrate (IMDUR) 30 MG 24 hr tablet Take 0.5 tablets (15 mg total) by mouth daily. 07/11/18 11/27/18 Yes Tillery, Mariam Dollar, PA-C  magnesium oxide (MAG-OX) 400 (241.3 Mg) MG tablet Take 400 mg by mouth See admin instructions. Twice a month   Yes [provider]  metolazone (ZAROXOLYN) 2.5 MG tablet 4 times weekly 07/11/18  Yes Tillery, Mariam Dollar, PA-C  nitroGLYCERIN (NITROSTAT) 0.4 MG SL tablet Place 0.4 mg under the tongue every 5 (five) minutes as needed for chest pain.   Yes [provider]  potassium chloride SA (K-DUR,KLOR-CON) 20 MEQ tablet Take 2 tablets (40 mEq total) by mouth 2 (two) times daily. 03/18/18  Yes Simmons, Brittainy M, PA-C  Pseudoephedrine-APAP-DM (DAYQUIL PO) Take 30 mLs by mouth daily as needed (cold / flu-like sxs).   Yes [provider]  spironolactone (ALDACTONE) 25 MG tablet Take 0.5 tablets (12.5 mg total) by mouth daily. 07/11/18  Yes Graciella Freer, PA-C  ticagrelor (BRILINTA) 60 MG TABS tablet Take 1 tablet (60 mg total) by mouth 2 (two) times daily. 04/02/17  Yes Dhungel, Nishant, MD  traMADol (ULTRAM) 50 MG tablet Take 1-2 tablets (50-100 mg total) by mouth every 12 (twelve) hours as needed for moderate pain. 11/26/17  Yes Graciella Freer, PA-C    Physical Exam: Vitals:   11/27/18 1354 11/27/18 1700 11/27/18 2200  BP: (!) 151/103 (!) 151/110 (!) 163/99  Pulse: 93 88 88  Resp: 19 (!) 31 18  Temp:  98.1 F (36.7 C)    TempSrc: Oral    SpO2: 98% 96% 97%    Constitutional: NAD, calm, comfortable Eyes: PERRL, lids and conjunctivae normal ENMT: Mucous membranes are moist. Posterior pharynx clear of any exudate or lesions.Normal dentition.  Neck: normal, supple, no masses, no thyromegaly Respiratory: Diffuse wheezes  Cardiovascular: Regular rate and rhythm, no murmurs / rubs / gallops. No extremity edema. 2+ pedal pulses. No carotid bruits.  Abdomen: no tenderness, no masses palpated. No hepatosplenomegaly. Bowel sounds positive.  Musculoskeletal: no clubbing / cyanosis. No joint deformity upper and lower extremities. Good ROM, no contractures. Normal muscle tone.  Skin: no rashes, lesions, ulcers. No induration Neurologic: CN 2-12 grossly intact. Sensation intact, DTR normal. Strength 5/5 in all 4.  Psychiatric: Normal judgment and insight. Alert and oriented x 3. Normal mood.  Labs on Admission: I have personally reviewed following labs and imaging studies  CBC: Recent Labs  Lab 11/27/18 1607  WBC 8.0  NEUTROABS 4.9  HGB 14.1  HCT 46.2*  MCV 99.1  PLT 312   Basic Metabolic Panel: Recent Labs  Lab 11/27/18 1750  NA 146*  K 4.0  CL 112*  CO2 24  GLUCOSE 101*  BUN 17  CREATININE 1.39*  CALCIUM 9.1   GFR: CrCl cannot be calculated (Unknown ideal weight.). Liver Function Tests: Recent Labs  Lab 11/27/18 1750  AST 26  ALT 68*  ALKPHOS 145*  BILITOT 1.9*  PROT 7.1  ALBUMIN 3.8   No results for input(s): LIPASE, AMYLASE in the last 168 hours. No results for input(s): AMMONIA in the last 168 hours. Coagulation Profile: No results for input(s): INR, PROTIME in the last 168 hours. Cardiac Enzymes: Recent Labs  Lab 11/27/18 1607  TROPONINI <0.03   BNP (last 3 results) No results for input(s): PROBNP in the last 8760 hours. HbA1C: No results for input(s): HGBA1C in the last 72 hours. CBG: No results for input(s): GLUCAP in the last 168 hours. Lipid  Profile: No results for input(s): CHOL, HDL, LDLCALC, TRIG, CHOLHDL, LDLDIRECT in the last 72 hours. Thyroid Function Tests: No results for input(s): TSH, T4TOTAL, FREET4, T3FREE, THYROIDAB in the last 72 hours. Anemia Panel: No results for input(s): VITAMINB12, FOLATE, FERRITIN, TIBC, IRON, RETICCTPCT in the last 72 hours. Urine analysis:    Component Value Date/Time   COLORURINE AMBER (A) 11/27/2018 1608   APPEARANCEUR HAZY (A) 11/27/2018 1608   LABSPEC 1.026 11/27/2018 1608   PHURINE 5.0 11/27/2018 1608   GLUCOSEU NEGATIVE 11/27/2018 1608   HGBUR SMALL (A) 11/27/2018 1608   BILIRUBINUR NEGATIVE 11/27/2018 1608   KETONESUR NEGATIVE 11/27/2018 1608   PROTEINUR >=300 (A) 11/27/2018 1608   UROBILINOGEN 1.0 07/25/2015 1601   NITRITE NEGATIVE 11/27/2018 1608   LEUKOCYTESUR TRACE (A) 11/27/2018 1608    Radiological Exams on Admission: Dg Chest 2 View  Result Date: 11/27/2018 CLINICAL DATA:  Productive cough with fatigue for 2 weeks. History of congestive heart failure and hypertension. EXAM: CHEST - 2 VIEW COMPARISON:  03/10/2018 and 11/18/2017 radiographs. FINDINGS: Stable cardiomegaly, chronic vascular congestion and blunting of the right costophrenic angle. No airspace disease, enlarging pleural effusion or pneumothorax. The bones appear unchanged. IMPRESSION: Stable chronic findings including cardiomegaly and vascular congestion. No evidence of pneumonia. Electronically Signed   By: Carey Bullocks M.D.   On: 11/27/2018 15:09    EKG: Independently reviewed.  Assessment/Plan Principal Problem:   Acute on chronic systolic CHF (congestive heart failure) (HCC) Active Problems:   Cardiomyopathy, ischemic   Essential hypertension   CKD (chronic kidney disease), stage III (HCC)    1. Acute on chronic systolic CHF - 1. CHF pathway 2. Lasix  IV BID for now 3. One dose of 2.5mg  zaroxolyn 4. Adult wheeze protocol for neb treatments 5. Tele monitor 6. 2d echo 7. Daily  BMP 8. Strict intake and ouput 2. HTN - 1. Continue coreg (only takes once a day apparently) 2. Continue continue imdur 3. Presumably not on ACEi due to renal insufficiency, creat 1.4 today. 4. Continue hydralazine 5. If BP still running high after Lasix / zaroxolyn then give dose of IV labetalol 3. ? UTI - 1. Got dose of rocephin in ED 2. Culture pending  DVT prophylaxis: Lovenox Code Status: Full Family Communication: No family in room Disposition Plan: Home after admit Consults called: None Admission status: Place  in obs  Hillary BowGARDNER, Jaella Weinert M. DO [facility] Pager [phone] Only works nights!  If 7AM-7PM, please contact the primary day team physician taking care of patient  [email or web address] Password [email or web address]  11/27/2018, 10:07 PM

## 2018-11-27 NOTE — ED Notes (Signed)
IV team at bedside 

## 2018-11-27 NOTE — ED Notes (Signed)
Pt spitting her sputum from cough into floor because she stated that her emesis bag that ws sitting right beside was full.  Informed pt that emesis bag is not full as I picked emesis bag and tissue that had sputum on it fell to bottom of empty bag.  Pt was instructed to use emesis bag instead of spitting on the floor. Pt was given wet washcloth. Advised patient that she can have ginger ale at this time, when she asked to "have something to eat or I am going to throw up".   Pt has nebulizer on and administering at this time.

## 2018-11-27 NOTE — ED Provider Notes (Signed)
[facility]-EMERGENCY DEPT Provider Note   CSN: [id number] Arrival date & time: 11/27/18  1341     History   Chief Complaint Chief Complaint  Patient presents with  . Cough    HPI Deborah Ho is a 68 y.o. female.  The history is provided by the patient and medical records. No language interpreter was used.  Shortness of Breath  Severity:  Severe Onset quality:  Gradual Duration:  2 days Timing:  Constant Progression:  Waxing and waning Chronicity:  New Context: URI   Relieved by:  Rest Worsened by:  Movement and coughing Ineffective treatments:  None tried Associated symptoms: cough and wheezing   Associated symptoms: no abdominal pain, no chest pain, no diaphoresis, no fever, no headaches, no neck pain, no rash, no syncope and no vomiting     Past Medical History:  Diagnosis Date  . CAD (coronary artery disease) 05/03/14; 05/09/14   a. anterior STEMI with early in-stent thorombosis for missed dose of Brillinta s/p PCI with DESx 2 into LAD (04/2014)  . CHF (congestive heart failure) (HCC)   . GERD (gastroesophageal reflux disease)    Probable  . HTN (hypertension)   . Hyperlipidemia   . Ischemic cardiomyopathy    a. 04/2014 ECHO with EF 45-50% b.  Repeat 2D echo 08/14/14 with EF down at 15%. Life vest placed  . Non compliance w medication regimen   . Tobacco use     Patient Active Problem List   Diagnosis Date Noted  . Noncompliance with medication regimen 04/02/2017  . Intractable nausea and vomiting 02/18/2017  . Hyperlipidemia   . GERD (gastroesophageal reflux disease)   . CHF (congestive heart failure) (HCC)   . Cellulitis of left lower extremity   . Cough productive of clear sputum   . Cellulitis of left lower leg   . Hypokalemia 01/28/2017  . Peripheral edema   . Abdominal distension 06/26/2016  . Anasarca 06/26/2016  . Chronic systolic heart failure (HCC) 02/20/2016  . CKD (chronic kidney disease), stage III (HCC)   .  Adjustment disorder with mixed anxiety and depressed mood 02/28/2015  . Pleural effusion on right   . Essential hypertension   . Angioedema of lips 08/17/2014  . Ischemic cardiomyopathy 08/13/2014  . Coronary atherosclerosis of native coronary artery 05/10/2014  . History of ST elevation myocardial infarction (STEMI) 05/09/2014  . HLD (hyperlipidemia) 05/06/2014  . Tobacco abuse 05/06/2014  . Cardiomyopathy, ischemic 05/06/2014  . Elevated LFTs 05/06/2014    Past Surgical History:  Procedure Laterality Date  . CHOLECYSTECTOMY    . CORONARY ANGIOPLASTY WITH STENT PLACEMENT  05/03/14   STEMI- stent to LAD DES- Xience alpine  . CORONARY ANGIOPLASTY WITH STENT PLACEMENT  05/09/14   STEMI- overlapping stent to LAD, pt had missed a dose of Brilinta  . LEFT HEART CATHETERIZATION WITH CORONARY ANGIOGRAM N/A 05/03/2014   Procedure: LEFT HEART CATHETERIZATION WITH CORONARY ANGIOGRAM;  Surgeon: Peter M Swaziland, MD;  Location: [facility] CATH LAB;  Service: Cardiovascular;  Laterality: N/A;  . LEFT HEART CATHETERIZATION WITH CORONARY ANGIOGRAM N/A 05/09/2014   Procedure: LEFT HEART CATHETERIZATION WITH CORONARY ANGIOGRAM;  Surgeon: Corky Crafts, MD;  Location: [facility] CATH LAB;  Service: Cardiovascular;  Laterality: N/A;  . PARTIAL HYSTERECTOMY    . PERCUTANEOUS STENT INTERVENTION  05/03/2014   Procedure: PERCUTANEOUS STENT INTERVENTION;  Surgeon: Peter M Swaziland, MD;  Location: [facility] CATH LAB;  Service: Cardiovascular;;  DES Prox LAD      OB History  No obstetric history on file.      Home Medications    Prior to Admission medications   Medication Sig Start Date End Date Taking? Authorizing Provider  atorvastatin (LIPITOR) 40 MG tablet Take 1 tablet (40 mg total) by mouth daily at 6 PM. 03/18/18   Allayne Butcher, PA-C  cetirizine (ZYRTEC) 10 MG tablet Take 10 mg daily by mouth.     [provider]  cholecalciferol (VITAMIN D) 1000 units tablet Take 1,000 Units by mouth See admin  instructions. Twice a month    [provider]  ferrous sulfate 325 (65 FE) MG tablet Take 325 mg by mouth See admin instructions. Three times daily on Monday,Wednesday, friday    [provider]  fluticasone Aleda Grana) 50 MCG/ACT nasal spray  04/04/18   [provider]  furosemide (LASIX) 80 MG tablet Take 1 tablet (80 mg total) by mouth 2 (two) times daily. 03/18/18   Robbie Lis M, PA-C  hydrALAZINE (APRESOLINE) 25 MG tablet Take 1 tablet (25 mg total) by mouth 3 (three) times daily. 07/11/18 10/09/18  Graciella Freer, PA-C  isosorbide mononitrate (IMDUR) 30 MG 24 hr tablet Take 0.5 tablets (15 mg total) by mouth daily. 07/11/18 10/09/18  Graciella Freer, PA-C  magnesium oxide (MAG-OX) 400 (241.3 Mg) MG tablet Take 400 mg by mouth See admin instructions. Twice a month    [provider]  metolazone (ZAROXOLYN) 2.5 MG tablet 4 times weekly 07/11/18   Graciella Freer, PA-C  nitroGLYCERIN (NITROSTAT) 0.4 MG SL tablet Place 0.4 mg under the tongue every 5 (five) minutes as needed for chest pain.    [provider]  potassium chloride SA (K-DUR,KLOR-CON) 20 MEQ tablet Take 2 tablets (40 mEq total) by mouth 2 (two) times daily. 03/18/18   Allayne Butcher, PA-C  spironolactone (ALDACTONE) 25 MG tablet Take 0.5 tablets (12.5 mg total) by mouth daily. 07/11/18   Graciella Freer, PA-C  ticagrelor (BRILINTA) 60 MG TABS tablet Take 1 tablet (60 mg total) by mouth 2 (two) times daily. 04/02/17   Dhungel, Theda Belfast, MD  traMADol (ULTRAM) 50 MG tablet Take 1-2 tablets (50-100 mg total) by mouth every 12 (twelve) hours as needed for moderate pain. 11/26/17   Graciella Freer, PA-C    Family History Family History  Problem Relation Age of Onset  . Diabetes Mother   . Hypertension Mother     Social History Social History   Tobacco Use  . Smoking status: Current Some Day Smoker    Packs/day: 0.10    Years: 0.50    Pack  years: 0.05    Types: Cigarettes  . Smokeless tobacco: Current User  . Tobacco comment: down to 3 cigarettes daily (08/03/14)  Substance Use Topics  . Alcohol use: No  . Drug use: No     Allergies   Aspirin; Effient [prasugrel]; Entresto [sacubitril-valsartan]; Lactose intolerance (gi); Robitussin dm [guaifenesin-dm]; Sulfa antibiotics; Other; and Wheat bran   Review of Systems Review of Systems  Constitutional: Positive for fatigue. Negative for chills, diaphoresis and fever.  HENT: Positive for congestion.   Respiratory: Positive for cough, chest tightness, shortness of breath and wheezing. Negative for stridor.   Cardiovascular: Positive for leg swelling (slightly worsened than normal). Negative for chest pain, palpitations and syncope.  Gastrointestinal: Negative for abdominal pain, constipation, diarrhea, nausea and vomiting.  Genitourinary: Negative for dysuria.  Musculoskeletal: Negative for back pain, neck pain and neck stiffness.  Skin: Negative for rash and wound.  Neurological: Negative for light-headedness and headaches.  Psychiatric/Behavioral: Negative for agitation and confusion.  All other systems reviewed and are negative.    Physical Exam Updated Vital Signs BP (!) 151/103 (BP Location: Left Arm)   Pulse 93   Temp 98.1 F (36.7 C) (Oral)   Resp 19   SpO2 98%   Physical Exam Vitals signs and nursing note reviewed.  Constitutional:      General: She is not in acute distress.    Appearance: She is well-developed. She is not ill-appearing, toxic-appearing or diaphoretic.  HENT:     Head: Normocephalic and atraumatic.     Nose: Congestion present. No rhinorrhea.     Mouth/Throat:     Pharynx: No oropharyngeal exudate or posterior oropharyngeal erythema.  Eyes:     Conjunctiva/sclera: Conjunctivae normal.  Neck:     Musculoskeletal: Neck supple.  Cardiovascular:     Rate and Rhythm: Normal rate and regular rhythm.     Heart sounds: No murmur.    Pulmonary:     Effort: Pulmonary effort is normal. No respiratory distress.     Breath sounds: Wheezing present. No rhonchi or rales.  Chest:     Chest wall: No tenderness.  Abdominal:     General: Abdomen is flat.     Palpations: Abdomen is soft.     Tenderness: There is no abdominal tenderness.  Musculoskeletal:     Right lower leg: Edema (mild) present.     Left lower leg: Edema (mild) present.  Skin:    General: Skin is warm and dry.     Findings: No erythema, lesion or rash.  Neurological:     General: No focal deficit present.     Mental Status: She is alert.  Psychiatric:        Mood and Affect: Mood normal.      ED Treatments / Results  Labs (all labs ordered are listed, but only abnormal results are displayed) Labs Reviewed  CBC WITH DIFFERENTIAL/PLATELET - Abnormal; Notable for the following components:      Result Value   HCT 46.2 (*)    All other components within normal limits  URINALYSIS, ROUTINE W REFLEX MICROSCOPIC - Abnormal; Notable for the following components:   Color, Urine AMBER (*)    APPearance HAZY (*)    Hgb urine dipstick SMALL (*)    Protein, ur >=300 (*)    Leukocytes, UA TRACE (*)    Bacteria, UA MANY (*)    All other components within normal limits  BRAIN NATRIURETIC PEPTIDE - Abnormal; Notable for the following components:   B Natriuretic Peptide 1,435.0 (*)    All other components within normal limits  COMPREHENSIVE METABOLIC PANEL - Abnormal; Notable for the following components:   Sodium 146 (*)    Chloride 112 (*)    Glucose, Bld 101 (*)    Creatinine, Ser 1.39 (*)    ALT 68 (*)    Alkaline Phosphatase 145 (*)    Total Bilirubin 1.9 (*)    GFR calc non Af Amer 39 (*)    GFR calc Af Amer 45 (*)    All other components within normal limits  URINE CULTURE  TROPONIN I  HIV ANTIBODY (ROUTINE TESTING W REFLEX)  BASIC METABOLIC PANEL    EKG None  Radiology Dg Chest 2 View  Result Date: 11/27/2018 CLINICAL DATA:   Productive cough with fatigue for 2 weeks. History of congestive heart failure and hypertension. EXAM: CHEST - 2 VIEW COMPARISON:  03/10/2018 and 11/18/2017 radiographs. FINDINGS: Stable cardiomegaly, chronic vascular congestion and blunting of the right costophrenic angle. No airspace disease, enlarging pleural effusion or pneumothorax. The bones appear unchanged. IMPRESSION: Stable chronic findings including cardiomegaly and vascular congestion. No evidence of pneumonia. Electronically Signed   By: Carey BullocksWilliam  Veazey M.D.   On: 11/27/2018 15:09    Procedures Procedures (including critical care time)  Medications Ordered in ED Medications  sodium chloride flush (NS) 0.9 % injection 3 mL (has no administration in time range)  sodium chloride flush (NS) 0.9 % injection 3 mL (has no administration in time range)  0.9 %  sodium chloride infusion (has no administration in time range)  acetaminophen (TYLENOL) tablet 650 mg (has no administration in time range)  ondansetron (ZOFRAN) injection 4 mg (has no administration in time range)  enoxaparin (LOVENOX) injection 40 mg (40 mg Subcutaneous Not Given 11/27/18 2326)  furosemide (LASIX) injection 80 mg (has no administration in time range)  atorvastatin (LIPITOR) tablet 40 mg (has no administration in time range)  fluticasone (FLONASE) 50 MCG/ACT nasal spray 1 spray (has no administration in time range)  ferrous sulfate tablet 325 mg (has no administration in time range)  hydrALAZINE (APRESOLINE) tablet 25 mg (25 mg Oral Given 11/27/18 2326)  isosorbide mononitrate (IMDUR) 24 hr tablet 15 mg (has no administration in time range)  potassium chloride SA (K-DUR,KLOR-CON) CR tablet 40 mEq (has no administration in time range)  spironolactone (ALDACTONE) tablet 12.5 mg (has no administration in time range)  ticagrelor (BRILINTA) tablet 60 mg (60 mg Oral Given 11/27/18 2356)  carvedilol (COREG) tablet 6.25 mg (has no administration in time range)  albuterol  (PROVENTIL) (2.5 MG/3ML) 0.083% nebulizer solution 2.5 mg (has no administration in time range)  ipratropium-albuterol (DUONEB) 0.5-2.5 (3) MG/3ML nebulizer solution 3 mL (3 mLs Nebulization Given 11/27/18 1557)  ipratropium-albuterol (DUONEB) 0.5-2.5 (3) MG/3ML nebulizer solution 3 mL (3 mLs Nebulization Given 11/27/18 1712)  furosemide (LASIX) injection 40 mg (40 mg Intravenous Given 11/27/18 2254)  cefTRIAXone (ROCEPHIN) 1 g in sodium chloride 0.9 % 100 mL IVPB (1 g Intravenous New Bag/Given 11/27/18 2213)  metolazone (ZAROXOLYN) tablet 2.5 mg (2.5 mg Oral Given 11/27/18 2356)     Initial Impression / Assessment and Plan / ED Course  I have reviewed the triage vital signs and the nursing notes.  Pertinent labs & imaging results that were available during my care of the patient were reviewed by me and considered in my medical decision making (see chart for details).     Deborah Ho is a 68 y.o. female with a past medical history significant for hypertension, hyperlipidemia, CAD status post PCI, CHF, GERD, who presents with 2 weeks of cough, mild congestion, fatigue, malaise, and several days of shortness of breath.  Patient reports that she lives in a facility that has lots of sick contacts.  Patient says that she had more congestion rhinorrhea and chills 2 weeks ago but that has improved but she is still having persistent cough.  She reports a loss of that she is developed no shortness of breath.  She says that it is worsened when she stands up and tries to ambulate but is better with rest.  She denies any chest pain chest pressure or palpitations.  No nausea or vomiting.  No constipation or diarrhea.  Patient does report her urine is darker than normal.  Patient reports some mild sore throat with this.  No difficulty with swallowing.  On exam, patient is wheezing in all  lung fields.  She denies any history of asthma or COPD but reports she used to smoke.  She reports she is used inhaler in the  past but has never had wheezing like this.  Patient's chest is nontender.  No murmur.  Legs have mild edema.  Patient thinks she may be fluid overloaded causing her shortness of breath.  Abdomen and back are nontender.  No CVA tenderness.  Patient was given breathing treatment and to have work-up to look for etiology of her shortness of breath.  I suspect patient is having reactive airway disease in the setting of recent URI symptoms.  Patient will x-ray to look for pneumonia.  She will have work-up to look for CHF exacerbation versus other etiology.  The patient has improvement in wheezing after breathing treatment, she would like to be stable for steroids and inhaler and outpatient follow-up.  She is found to have fluid overload, despite admission if she gets hypoxic.  9:31 PM Patient was ambulated by nurse assistant.  Patient says that she got extremely winded, got lightheaded, and vomited on the floor.  She is still extremely short of breath.  On my assessment she is tachypneic in the 30s although her oxygen saturation maintains at 98%.  Chart review shows that patient's labs have begun to return.  Patient was found to have a BNP of over 1400 was negative troponin.  Chest x-ray shows cardiomegaly and vascular congestion.  No pneumonia.  Urinalysis shows protein, leukocytes and bacteria, given the patient's report of urine appearance change and some discomfort in her lower abdomen, patient was treated for UTI.  Due to the exertional shortness of breath symptoms, elevated BNP, x-ray showing fluid overload with crackles on exam and patient reporting that she does not feel safe with her breathing going home, patient will be called for admission for diuresis.  Patient also given antibiotics for UTI treatment.  Anticipate observation admission with discharge tomorrow if patient's diuresis improves and she is able to ambulate without getting extremely short of breath.   Final Clinical Impressions(s) / ED  Diagnoses   Final diagnoses:  Shortness of breath  Exertional shortness of breath  Acute cystitis without hematuria    ED Discharge Orders    None      Clinical Impression: 1. Shortness of breath   2. Exertional shortness of breath   3. Acute cystitis without hematuria     Disposition: Admit  This note was prepared with assistance of Dragon voice recognition software. Occasional wrong-word or sound-a-like substitutions may have occurred due to the inherent limitations of voice recognition software.     , Canary Brimhristopher J, MD 11/27/18 [phone]

## 2018-11-28 ENCOUNTER — Observation Stay (HOSPITAL_BASED_OUTPATIENT_CLINIC_OR_DEPARTMENT_OTHER): Payer: Medicare Other

## 2018-11-28 ENCOUNTER — Other Ambulatory Visit: Payer: Self-pay

## 2018-11-28 DIAGNOSIS — I5043 Acute on chronic combined systolic (congestive) and diastolic (congestive) heart failure: Secondary | ICD-10-CM | POA: Diagnosis not present

## 2018-11-28 DIAGNOSIS — N183 Chronic kidney disease, stage 3 (moderate): Secondary | ICD-10-CM | POA: Diagnosis present

## 2018-11-28 DIAGNOSIS — E669 Obesity, unspecified: Secondary | ICD-10-CM | POA: Diagnosis present

## 2018-11-28 DIAGNOSIS — Z79899 Other long term (current) drug therapy: Secondary | ICD-10-CM | POA: Diagnosis not present

## 2018-11-28 DIAGNOSIS — R0602 Shortness of breath: Secondary | ICD-10-CM | POA: Diagnosis present

## 2018-11-28 DIAGNOSIS — Z90711 Acquired absence of uterus with remaining cervical stump: Secondary | ICD-10-CM | POA: Diagnosis not present

## 2018-11-28 DIAGNOSIS — I255 Ischemic cardiomyopathy: Secondary | ICD-10-CM | POA: Diagnosis present

## 2018-11-28 DIAGNOSIS — J40 Bronchitis, not specified as acute or chronic: Secondary | ICD-10-CM | POA: Diagnosis present

## 2018-11-28 DIAGNOSIS — E739 Lactose intolerance, unspecified: Secondary | ICD-10-CM | POA: Diagnosis present

## 2018-11-28 DIAGNOSIS — I10 Essential (primary) hypertension: Secondary | ICD-10-CM | POA: Diagnosis not present

## 2018-11-28 DIAGNOSIS — I361 Nonrheumatic tricuspid (valve) insufficiency: Secondary | ICD-10-CM

## 2018-11-28 DIAGNOSIS — I5023 Acute on chronic systolic (congestive) heart failure: Secondary | ICD-10-CM | POA: Diagnosis present

## 2018-11-28 DIAGNOSIS — I509 Heart failure, unspecified: Secondary | ICD-10-CM

## 2018-11-28 DIAGNOSIS — F17210 Nicotine dependence, cigarettes, uncomplicated: Secondary | ICD-10-CM | POA: Diagnosis present

## 2018-11-28 DIAGNOSIS — I2510 Atherosclerotic heart disease of native coronary artery without angina pectoris: Secondary | ICD-10-CM | POA: Diagnosis present

## 2018-11-28 DIAGNOSIS — I340 Nonrheumatic mitral (valve) insufficiency: Secondary | ICD-10-CM | POA: Diagnosis not present

## 2018-11-28 DIAGNOSIS — N3000 Acute cystitis without hematuria: Secondary | ICD-10-CM | POA: Diagnosis present

## 2018-11-28 DIAGNOSIS — Z7902 Long term (current) use of antithrombotics/antiplatelets: Secondary | ICD-10-CM | POA: Diagnosis not present

## 2018-11-28 DIAGNOSIS — Z888 Allergy status to other drugs, medicaments and biological substances status: Secondary | ICD-10-CM | POA: Diagnosis not present

## 2018-11-28 DIAGNOSIS — I2721 Secondary pulmonary arterial hypertension: Secondary | ICD-10-CM | POA: Diagnosis present

## 2018-11-28 DIAGNOSIS — Z91018 Allergy to other foods: Secondary | ICD-10-CM | POA: Diagnosis not present

## 2018-11-28 DIAGNOSIS — Z6835 Body mass index (BMI) 35.0-35.9, adult: Secondary | ICD-10-CM | POA: Diagnosis not present

## 2018-11-28 DIAGNOSIS — Z9049 Acquired absence of other specified parts of digestive tract: Secondary | ICD-10-CM | POA: Diagnosis not present

## 2018-11-28 DIAGNOSIS — I130 Hypertensive heart and chronic kidney disease with heart failure and stage 1 through stage 4 chronic kidney disease, or unspecified chronic kidney disease: Secondary | ICD-10-CM | POA: Diagnosis present

## 2018-11-28 DIAGNOSIS — Z955 Presence of coronary angioplasty implant and graft: Secondary | ICD-10-CM | POA: Diagnosis not present

## 2018-11-28 DIAGNOSIS — Z7951 Long term (current) use of inhaled steroids: Secondary | ICD-10-CM | POA: Diagnosis not present

## 2018-11-28 DIAGNOSIS — E785 Hyperlipidemia, unspecified: Secondary | ICD-10-CM | POA: Diagnosis present

## 2018-11-28 DIAGNOSIS — Z882 Allergy status to sulfonamides status: Secondary | ICD-10-CM | POA: Diagnosis not present

## 2018-11-28 DIAGNOSIS — I252 Old myocardial infarction: Secondary | ICD-10-CM | POA: Diagnosis not present

## 2018-11-28 DIAGNOSIS — K219 Gastro-esophageal reflux disease without esophagitis: Secondary | ICD-10-CM | POA: Diagnosis present

## 2018-11-28 LAB — BASIC METABOLIC PANEL
Anion gap: 12 (ref 5–15)
BUN: 19 mg/dL (ref 8–23)
CO2: 22 mmol/L (ref 22–32)
Calcium: 9 mg/dL (ref 8.9–10.3)
Chloride: 112 mmol/L — ABNORMAL HIGH (ref 98–111)
Creatinine, Ser: 1.47 mg/dL — ABNORMAL HIGH (ref 0.44–1.00)
GFR calc Af Amer: 42 mL/min — ABNORMAL LOW (ref >60)
GFR calc non Af Amer: 37 mL/min — ABNORMAL LOW (ref >60)
Glucose, Bld: 117 mg/dL — ABNORMAL HIGH (ref 70–99)
Potassium: 3.5 mmol/L (ref 3.5–5.1)
Sodium: 146 mmol/L — ABNORMAL HIGH (ref 135–145)

## 2018-11-28 LAB — ECHOCARDIOGRAM COMPLETE
Height: 65.5 in
Weight: 3510.4 oz

## 2018-11-28 LAB — TROPONIN I: Troponin I: 0.03 ng/mL (ref <0.03)

## 2018-11-28 MED ORDER — SODIUM CHLORIDE 0.9 % IV SOLN
1.0000 g | INTRAVENOUS | Status: DC
  Administered 2018-11-28: 1 g via INTRAVENOUS
  Filled 2018-11-28: qty 1

## 2018-11-28 MED ORDER — PERFLUTREN LIPID MICROSPHERE
1.0000 mL | INTRAVENOUS | Status: AC | PRN
  Administered 2018-11-28: 2 mL via INTRAVENOUS
  Filled 2018-11-28: qty 10

## 2018-11-28 MED ORDER — CARVEDILOL 6.25 MG PO TABS
6.2500 mg | ORAL_TABLET | Freq: Two times a day (BID) | ORAL | Status: DC
  Administered 2018-11-28 – 2018-12-04 (×12): 6.25 mg via ORAL
  Filled 2018-11-28 (×12): qty 1

## 2018-11-28 NOTE — Progress Notes (Signed)
  Echocardiogram 2D Echocardiogram has been performed.  Deborah Ho 11/28/2018, 10:01 AM

## 2018-11-28 NOTE — Progress Notes (Signed)
Patient Demographics:    Deborah Ho, is a 68 y.o. female, DOB - September 04, 1951, ZWC:[id number]  Admit date - 11/27/2018   Admitting Physician Hillary Bow, DO  Outpatient Primary MD for the patient is Grayce Sessions, NP  LOS - 0   Chief Complaint  Patient presents with  . Cough        Subjective:    Deboraha Weyers today has no fevers, no emesis,  No chest pain, shortness of breath persist, dry cough persist, no chest pains, patient with orthopnea  Assessment  & Plan :    Principal Problem:   Acute on chronic systolic CHF (congestive heart failure) (HCC) Active Problems:   Cardiomyopathy, ischemic   Essential hypertension   CKD (chronic kidney disease), stage III (HCC)   Acute exacerbation of CHF (congestive heart failure) (HCC)  Brief Summary Deborah Ho is a 67 y.o. female with a hx of HTN, HLD, tobacco use, CAD with hx Ant STEMI with early stent thrombosis for missing doses of Brilinta and had PCI X 2 to LAD 2015, chronic systolic HF with ICM at one point 10-15%-admitted on 11/27/2018 with shortness of breath with clinical exam and radiological findings consistent with Acute CHF exacerbation   Plan:- 1)HFrEF--- acute on chronic systolic dysfunction CHF exacerbation, repeat echo with EF in the 15% range largely unchanged from prior echo, most recent weight at the heart failure clinic on 07/12/2019  was 181 pounds, patient is at least 27 pounds and possibly up to 40 pounds above her dry weight at this time--- cardiology consult appreciated, continue isosorbide 15 mg daily along with hydralazine 25 mg 3 times daily , Lasix 80 mg IV twice daily, Aldactone 12.5 mg daily, avoid ACEI/ARB/ARNI due to renal concerns, echo from 11/28/2018 sadly continues to show EF around 15% largely unchanged from prior echo   2) possible UTI--- continue IV Rocephin pending cultures  3)HTN----stable, okay to  continue Coreg, Aldactone, hydralazine/isosorbide combo as above  4) stage III CKD----stable, baseline creatinine usually around 1.6, creatinine was 1.39 on admission reflecting possible volume overload/dilutional, with diuresis creatinine is now up to 1.47,      , renally adjust medications, avoid nephrotoxic agents/dehydration/hypotension, monitor renal function and electrolytes closely while diuresing patient  5)H/o CAD--- stable, troponin not elevated, no chest pains no ACS symptoms, continue Lipitor 40 mg daily, Coreg 6.25 mg, continue Brilinta, isosorbide 50 mg daily  Disposition/Need for in-Hospital Stay- patient unable to be discharged at this time due to significant volume overload with more than 27 pound weight gain requiring IV diuresis with close monitoring of renal function and electrolytes while diuresing patient, she remains symptomatic with shortness of breath, significant dyspnea on exertion and orthopnea  Code Status : full  Family Communication:   none   Disposition Plan  : TBD  Consults  :  cardiology  DVT Prophylaxis  :  Lovenox   Lab Results  Component Value Date   PLT 312 11/27/2018    Inpatient Medications  Scheduled Meds: . atorvastatin  40 mg Oral q1800  . carvedilol  6.25 mg Oral Daily  . enoxaparin (LOVENOX) injection  40 mg Subcutaneous Q24H  . ferrous sulfate  325 mg Oral 3 times per day on Mon Wed Fri  .  furosemide  80 mg Intravenous BID  . hydrALAZINE  25 mg Oral TID  . isosorbide mononitrate  15 mg Oral Daily  . potassium chloride SA  40 mEq Oral BID  . sodium chloride flush  3 mL Intravenous Q12H  . spironolactone  12.5 mg Oral Daily  . ticagrelor  60 mg Oral BID   Continuous Infusions: . sodium chloride    . cefTRIAXone (ROCEPHIN)  IV     PRN Meds:.sodium chloride, acetaminophen, albuterol, fluticasone, ondansetron (ZOFRAN) IV, sodium chloride flush    Anti-infectives (From admission, onward)   Start     Dose/Rate Route Frequency  Ordered Stop   11/28/18 1630  cefTRIAXone (ROCEPHIN) 1 g in sodium chloride 0.9 % 100 mL IVPB     1 g 200 mL/hr over 30 Minutes Intravenous Every 24 hours 11/28/18 1609 12/01/18 1629   11/27/18 2145  cefTRIAXone (ROCEPHIN) 1 g in sodium chloride 0.9 % 100 mL IVPB     1 g 200 mL/hr over 30 Minutes Intravenous  Once 11/27/18 2134 11/28/18 0803        Objective:   Vitals:   11/27/18 2312 11/28/18 0010 11/28/18 0404 11/28/18 1429  BP: (!) 178/105  (!) 148/95 (!) 155/103  Pulse: 92  96 95  Resp: (!) 26  18 18   Temp: (!) 97.5 F (36.4 C) 98.1 F (36.7 C) 97.7 F (36.5 C) 98.3 F (36.8 C)  TempSrc: Oral  Oral Oral  SpO2: 98%  98% 97%  Weight: 101 kg  99.5 kg   Height: 5' 5.5" (1.664 m)       Wt Readings from Last 3 Encounters:  11/28/18 99.5 kg  07/11/18 82.1 kg  06/16/18 85.7 kg     Intake/Output Summary (Last 24 hours) at 11/28/2018 1613 Last data filed at 11/28/2018 1400 Gross per 24 hour  Intake 720 ml  Output 2325 ml  Net -1605 ml     Physical Exam Patient is examined daily including today on 11/28/2018 , exams remain the same as of yesterday except that has changed   Gen:- Awake Alert,  Obese, mild conversational dyspnea HEENT:- [place].AT, No sclera icterus Neck-Supple Neck, +ve JVD,.  Lungs-diminished in bases with bibasilar rales CV- S1, S2 normal, regular  Abd-  +ve B.Sounds, Abd Soft, No tenderness,    Extremity/Skin:- 1+  edema, pedal pulses present  Psych-affect is appropriate, oriented x3 Neuro-no new focal deficits, no tremors   Data Review:   Micro Results No results found for this or any previous visit (from the past 240 hour(s)).  Radiology Reports Dg Chest 2 View  Result Date: 11/27/2018 CLINICAL DATA:  Productive cough with fatigue for 2 weeks. History of congestive heart failure and hypertension. EXAM: CHEST - 2 VIEW COMPARISON:  03/10/2018 and 11/18/2017 radiographs. FINDINGS: Stable cardiomegaly, chronic vascular congestion and blunting of the  right costophrenic angle. No airspace disease, enlarging pleural effusion or pneumothorax. The bones appear unchanged. IMPRESSION: Stable chronic findings including cardiomegaly and vascular congestion. No evidence of pneumonia. Electronically Signed   By: Carey BullocksWilliam  Veazey M.D.   On: 11/27/2018 15:09     CBC Recent Labs  Lab 11/27/18 1607  WBC 8.0  HGB 14.1  HCT 46.2*  PLT 312  MCV 99.1  MCH 30.3  MCHC 30.5  RDW 14.9  LYMPHSABS 2.2  MONOABS 0.7  EOSABS 0.1  BASOSABS 0.1    Chemistries  Recent Labs  Lab 11/27/18 1750 11/28/18 0534  NA 146* 146*  K 4.0 3.5  CL 112*  112*  CO2 24 22  GLUCOSE 101* 117*  BUN 17 19  CREATININE 1.39* 1.47*  CALCIUM 9.1 9.0  AST 26  --   ALT 68*  --   ALKPHOS 145*  --   BILITOT 1.9*  --     Lab Results  Component Value Date   HGBA1C 6.2 (H) 09/29/2017   Cardiac Enzymes Recent Labs  Lab 11/27/18 1607 11/28/18 1014  TROPONINI <0.03 0.03*   ------------------------------------------------------------------------------------------------------------------    Component Value Date/Time   BNP 1,435.0 (H) 11/27/2018 1607   BNP 2,344.6 (H) 04/24/2015 1350     Tyller Bowlby M.D on 11/28/2018 at 4:13 PM  Go to [email or web address] - for contact info  [facility] - Office  [phone]

## 2018-11-28 NOTE — Care Management Note (Signed)
Case Management Note  Patient Details  Name: Deborah Ho MRN: [id number] Date of Birth: Sep 18, 1951  Subjective/Objective: CHF. From home. Cm referral for Heart failure screen-1 adm/past 6 months. [employer]. Provided patient w/CHF REDS vest program for Heart failure-patient will let me know if she is interested.                   Action/Plan:dc home.   Expected Discharge Date:  (unknown)               Expected Discharge Plan:  Home w Home Health Services  In-House Referral:     Discharge planning Services  CM Consult  Post Acute Care Choice:    Choice offered to:  Patient  DME Arranged:    DME Agency:     HH Arranged:    HH Agency:     Status of Service:  In process, will continue to follow  If discussed at Long Length of Stay Meetings, dates discussed:    Additional Comments:  Lanier Clam, RN 11/28/2018, 12:43 PM

## 2018-11-28 NOTE — Consult Note (Addendum)
Cardiology Consultation:   Patient ID: Deborah Ho MRN: [id number]; DOB: 1951-10-24  Admit date: 11/27/2018 Date of Consult: 11/28/2018  Primary Care Provider: Grayce Sessions, NP Primary Cardiologist: Peter Swaziland, MD / CHF Dr. Shirlee Latch Primary Electrophysiologist:  None    Patient Profile:   Deborah Ho is a 68 y.o. female with a hx of HTN, HLD, tobacco use, CAD with hx Ant STEMI with early stent thrombosis for missing doses of Brilinta and had PCI X 2 to LAD 2015, chronic systolic HF with ICM at one point 10-15%- she was to move to [facility] but has not done so, who is being seen today for the evaluation of CHF at the request of Dr. Mariea Clonts.  History of Present Illness:   Deborah Ho with hx as above now presents with SOB, cough and fatigue for 2 weeks.  Denied fevers.  + chills. No chest pain  EKG poor quality - I personally reviewed, new Inf Q waves from 02/2018. SR  Na 146, K+ 3.5 CL 112 Cr 1.47, BUN 19,  BNP 1435 WBC 8 Hgb 14.1 plts 312   2 V CXR:  Stable chronic findings including cardiomegaly and vascular congestion. No evidence of pneumonia.  She is neg. 465 since admit. On lasix 80 mg BID  On BB 6.25 BID, hydralazine 25 mg TID, imdur 15 mg daily.  Aldactone 12.5 daily and Brilinta 60 mg BID  At home she was on metolazone 2.5 mg 4 times weekly.    Currently no chest pain and she has not had any, will recheck one other troponin.  Her breathing is improved with ABX and IV lasix.  She had been taking meds appropriately.  She stated HF had cancelled her appt.   She did have to prop up on some nights due to cough.  Thick creamy sputum,  With her cough.  No fevers, no N,V,diarrhea.  Her urine with neg nutrites tr leukocytes.  + bacteria -culture pending.   Her BP is elevated.    Echo was just done, not much improvement in EF.     Past Medical History:  Diagnosis Date  . CAD (coronary artery disease) 05/03/14; 05/09/14   a. anterior STEMI with early in-stent thorombosis  for missed dose of Brillinta s/p PCI with DESx 2 into LAD (04/2014)  . CHF (congestive heart failure) (HCC)   . GERD (gastroesophageal reflux disease)    Probable  . HTN (hypertension)   . Hyperlipidemia   . Ischemic cardiomyopathy    a. 04/2014 ECHO with EF 45-50% b.  Repeat 2D echo 08/14/14 with EF down at 15%. Life vest placed  . Non compliance w medication regimen   . Tobacco use     Past Surgical History:  Procedure Laterality Date  . CHOLECYSTECTOMY    . CORONARY ANGIOPLASTY WITH STENT PLACEMENT  05/03/14   STEMI- stent to LAD DES- Xience alpine  . CORONARY ANGIOPLASTY WITH STENT PLACEMENT  05/09/14   STEMI- overlapping stent to LAD, pt had missed a dose of Brilinta  . LEFT HEART CATHETERIZATION WITH CORONARY ANGIOGRAM N/A 05/03/2014   Procedure: LEFT HEART CATHETERIZATION WITH CORONARY ANGIOGRAM;  Surgeon: Peter M Swaziland, MD;  Location: [facility] CATH LAB;  Service: Cardiovascular;  Laterality: N/A;  . LEFT HEART CATHETERIZATION WITH CORONARY ANGIOGRAM N/A 05/09/2014   Procedure: LEFT HEART CATHETERIZATION WITH CORONARY ANGIOGRAM;  Surgeon: Corky Crafts, MD;  Location: [facility] CATH LAB;  Service: Cardiovascular;  Laterality: N/A;  . PARTIAL HYSTERECTOMY    . PERCUTANEOUS STENT INTERVENTION  05/03/2014   Procedure: PERCUTANEOUS STENT INTERVENTION;  Surgeon: Peter M Swaziland, MD;  Location: [facility] CATH LAB;  Service: Cardiovascular;;  DES Prox LAD      Home Medications:  Prior to Admission medications   Medication Sig Start Date End Date Taking? Authorizing Provider  atorvastatin (LIPITOR) 40 MG tablet Take 1 tablet (40 mg total) by mouth daily at 6 PM. 03/18/18  Yes Robbie Lis M, PA-C  carvedilol (COREG) 6.25 MG tablet Take 6.25 mg by mouth daily.   Yes [provider]  cetirizine (ZYRTEC) 10 MG tablet Take 10 mg daily by mouth.    Yes [provider]  cholecalciferol (VITAMIN D) 1000 units tablet Take 1,000 Units by mouth See admin instructions. Twice a month   Yes  [provider]  ferrous sulfate 325 (65 FE) MG tablet Take 325 mg by mouth See admin instructions. Three times daily on Monday,Wednesday, friday   Yes [provider]  fluticasone (FLONASE) 50 MCG/ACT nasal spray Place 1 spray into both nostrils daily as needed for allergies (congestion).  04/04/18  Yes [provider]  furosemide (LASIX) 80 MG tablet Take 1 tablet (80 mg total) by mouth 2 (two) times daily. 03/18/18  Yes Robbie Lis M, PA-C  hydrALAZINE (APRESOLINE) 25 MG tablet Take 1 tablet (25 mg total) by mouth 3 (three) times daily. 07/11/18 11/27/18 Yes Tillery, Mariam Dollar, PA-C  isosorbide mononitrate (IMDUR) 30 MG 24 hr tablet Take 0.5 tablets (15 mg total) by mouth daily. 07/11/18 11/27/18 Yes Tillery, Mariam Dollar, PA-C  magnesium oxide (MAG-OX) 400 (241.3 Mg) MG tablet Take 400 mg by mouth See admin instructions. Twice a month   Yes [provider]  metolazone (ZAROXOLYN) 2.5 MG tablet 4 times weekly 07/11/18  Yes Tillery, Mariam Dollar, PA-C  nitroGLYCERIN (NITROSTAT) 0.4 MG SL tablet Place 0.4 mg under the tongue every 5 (five) minutes as needed for chest pain.   Yes [provider]  potassium chloride SA (K-DUR,KLOR-CON) 20 MEQ tablet Take 2 tablets (40 mEq total) by mouth 2 (two) times daily. 03/18/18  Yes Simmons, Brittainy M, PA-C  Pseudoephedrine-APAP-DM (DAYQUIL PO) Take 30 mLs by mouth daily as needed (cold / flu-like sxs).   Yes [provider]  spironolactone (ALDACTONE) 25 MG tablet Take 0.5 tablets (12.5 mg total) by mouth daily. 07/11/18  Yes Graciella Freer, PA-C  ticagrelor (BRILINTA) 60 MG TABS tablet Take 1 tablet (60 mg total) by mouth 2 (two) times daily. 04/02/17  Yes Dhungel, Nishant, MD  traMADol (ULTRAM) 50 MG tablet Take 1-2 tablets (50-100 mg total) by mouth every 12 (twelve) hours as needed for moderate pain. 11/26/17  Yes Graciella Freer, PA-C    Inpatient Medications: Scheduled Meds: .  atorvastatin  40 mg Oral q1800  . carvedilol  6.25 mg Oral Daily  . enoxaparin (LOVENOX) injection  40 mg Subcutaneous Q24H  . ferrous sulfate  325 mg Oral 3 times per day on Mon Wed Fri  . furosemide  80 mg Intravenous BID  . hydrALAZINE  25 mg Oral TID  . isosorbide mononitrate  15 mg Oral Daily  . potassium chloride SA  40 mEq Oral BID  . sodium chloride flush  3 mL Intravenous Q12H  . spironolactone  12.5 mg Oral Daily  . ticagrelor  60 mg Oral BID   Continuous Infusions: . sodium chloride     PRN Meds: sodium chloride, acetaminophen, albuterol, fluticasone, ondansetron (ZOFRAN) IV, sodium chloride flush  Allergies:    Allergies  Allergen Reactions  . Aspirin Swelling    Chewable children's aspirin makes patients tongue and face swell  . Effient [Prasugrel] Swelling    Patient's tongue and face swells  . Entresto [Sacubitril-Valsartan] Other (See Comments)    dizziness  . Lactose Intolerance (Gi) Other (See Comments)    REACTION: stomach upset  . Robitussin Dm [Guaifenesin-Dm] Swelling    Patient's tongue swells  . Sulfa Antibiotics Swelling  . Other     Had to replace "catgut" with clamps  . Wheat Bran Other (See Comments)    REACTION: unknown    Social History:   Social History   Socioeconomic History  . Marital status: Widowed    Spouse name: Not on file  . Number of children: Not on file  . Years of education: Not on file  . Highest education level: Not on file  Occupational History  . 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 Financial resource strain: Not on file  . Food insecurity:    Worry: Not on file    Inability: Not on file  . Transportation needs:    Medical: Not on file    Non-medical: Not on file  Tobacco Use  . Smoking status: Current Some Day Smoker    Packs/day: 0.10    Years: 0.50    Pack years: 0.05    Types: Cigarettes  . Smokeless tobacco: Current User  . Tobacco comment: down to 3 cigarettes daily (08/03/14)  Substance and  Sexual Activity  . Alcohol use: No  . Drug use: No  . Sexual activity: Not on file  Lifestyle  . Physical activity:    Days per week: Not on file    Minutes per session: Not on file  . Stress: Not on file  Relationships  . Social connections:    Talks on phone: Not on file    Gets together: Not on file    Attends religious service: Not on file    Active member of club or organization: Not on file    Attends meetings of clubs or organizations: Not on file    Relationship status: Not on file  . Intimate partner violence:    Fear of current or ex partner: Not on file    Emotionally abused: Not on file    Physically abused: Not on file    Forced sexual activity: Not on file  Other Topics Concern  . Not on file  Social History Narrative   Patient has 6 brothers and sisters and none have known coronary artery disease. She lives alone in [place], but has several brothers in the area.    Family History:    Family History  Problem Relation Age of Onset  . Diabetes Mother   . Hypertension Mother      ROS:  Please see the history of present illness.  General:+ cold since first of the year.  no fevers, she had not weighed in some time.   Skin:no rashes or ulcers HEENT:no blurred vision, no congestion CV:see HPI PUL:see HPI GI:no diarrhea constipation or melena, no indigestion GU:no hematuria, no dysuria MS:no joint pain, no claudication Neuro:no syncope, no lightheadedness Endo:no diabetes, no thyroid disease  All other ROS reviewed and negative.     Physical Exam/Data:   Vitals:   11/27/18 2200 11/27/18 2312 11/28/18 0010 11/28/18 0404  BP: (!) 163/99 (!) 178/105  (!) 148/95  Pulse: 88 92  96  Resp: (!) 24 (!) 26  18  Temp:  Marland Kitchen)  97.5 F (36.4 C) 98.1 F (36.7 C) 97.7 F (36.5 C)  TempSrc:  Oral  Oral  SpO2: 97% 98%  98%  Weight:  101 kg  99.5 kg  Height:  5' 5.5" (1.664 m)      Intake/Output Summary (Last 24 hours) at 11/28/2018 0837 Last data filed at  11/28/2018 0409 Gross per 24 hour  Intake 360 ml  Output 825 ml  Net -465 ml   Last 3 Weights 11/28/2018 11/27/2018 07/11/2018  Weight (lbs) 219 lb 6.4 oz 222 lb 9.6 oz 181 lb  Weight (kg) 99.519 kg 100.971 kg 82.101 kg     Body mass index is 35.95 kg/m.  General:  Well nourished, well developed, in no acute distress HEENT: normal Lymph: no adenopathy Neck: no JVD Endocrine:  No thryomegaly Vascular: No carotid bruits; FA pulses 2+ bilaterally without bruits  Cardiac:  normal S1, S2; RRR; no murmur gallup rub or click Lungs:  clear to auscultation bilaterally, no wheezing, rhonchi or rales  Abd: soft, nontender, no hepatomegaly  Ext: no edema to tr Musculoskeletal:  No deformities, BUE and BLE strength normal and equal Skin: warm and dry  Neuro:  CNs 2-12 intact, no focal abnormalities noted Psych:  Normal affect   Telemetry:  Telemetry was personally reviewed and demonstrates:  SR with occ PVCs    Relevant CV Studies: Cath 05/03/2014 PROCEDURAL FINDINGS Hemodynamics: AO 162/105 mean 130 mm Hg LV 158/26 mm Hg  Coronary angiography: Coronary dominance: right  Left mainstem: Normal  Left anterior descending (LAD): 95% proximal LAD stenosis with TIMI 2 flow.  Ramus intermediate: moderate sized vessel. Mild disease less than 20%.  Left circumflex (LCx): The LCx has 30% proximal disease. The LCx trifurcates into 3 OM branches. After the first branch there is a 60-70% stenosis in the OM prior to the next bifurcation.   Right coronary artery (RCA): The RCA arises low in the coronary cusp and was engaged with a Williams right catheter. This demonstrates a long segment of disease with 70-80% disease proximally tapering to 80-90% in the mid vessel.   Left ventriculography: Left ventricular systolic function is abnormal, LVEF is estimated at 35%, there is severe anterior hypokinesis with akinesis of the anterior apex and inferior apex. There is mild mitral regurgitation    PCI Note: Following the diagnostic procedure, the decision was made to proceed with PCI. Weight-based bivalirudin was given for anticoagulation. Brilinta 180 mg was given orally. Once a therapeutic ACT was achieved, a 6 [place] XBLAD 3.5 guide catheter was inserted. A prowater coronary guidewire was used to cross the lesion. The lesion was predilated with a 2.0 mm balloon. The lesion was then stented with a 3.0 x 23 mm Xience Alpine stent. The stent was postdilated with a 3.25 mm noncompliant balloon. Following PCI, there was 0% residual stenosis and TIMI-3 flow. Final angiography confirmed an excellent result. The patient tolerated the procedure well. There were no immediate procedural complications. A TR band was used for radial hemostasis. The patient was transferred to the post catheterization recovery area for further monitoring.  PCI Data: Vessel - LAD/Segment - proximal Percent Stenosis (pre) 95% TIMI-flow 2 Stent 3.0 x 23 mm Xience Alpine Percent Stenosis (post) 0% TIMI-flow (post) 3  Final Conclusions:  1. Severe 3 vessel obstructive CAD. Culprit lesion in the proximal LAD. 2. Severe LV dysfunction. 3. Successful stenting of the proximal LAD with a DES.   Recommendations:  Continue DAPT for one year. IV diuresis. BP control. May consider  patient for enrollment in Complete trial for her residual disease.   Cath 05/09/2014 IMPRESSIONS:  1. Normal left main coronary artery. 2. Subacute stent thrombosis of the day stent in the mid left anterior descending artery. Successful thrombectomy was performed with restoration of TIMI-3 flow. Intravascular ultrasound revealed a vessel diameter of about 3.5 mm. An overlapping stent was placed. This is a 3.0 x 23, overlapping the distal edge of the prior stent. The entire stented segment was post dilated with a 3.5 x 20 noncompliant balloon. Intravascular ultrasound was performed again showing excellent stent apposition and large  lumen diameter.. 3. Moderate disease in the left circumflex artery and its branches. 4. Severe disease in the right coronary artery. 5. Left ventricular systolic function not assessed. LVEDP 23 mmHg. Marland Kitchen  RECOMMENDATION:Continue dual antiplatelet therapy. IV tirofiban will be continued as well. Continue medical therapy for RCA disease. We stressed the importance of taking her medications as prescribed. Continue aggressive secondary prevention. She would benefit from cardiac rehabilitation. We'll watch in the ICU.    Echo 11/21/2017 LV EF: 10% - 15%  ------------------------------------------------------------------- Indications: CHF - 428.0.  ------------------------------------------------------------------- History: PMH: Coronary artery disease. Congestive heart failure. Cardiomyopathy of unknown etiology. PMH: Myocardial infarction. Risk factors: Current tobacco use. Hypertension. Dyslipidemia.  ------------------------------------------------------------------- Study Conclusions  - Left ventricle: The cavity size was mildly dilated. Wall thickness was normal. The estimated ejection fraction was in the range of 10% to 15%. Doppler parameters are consistent with restrictive physiology, indicative of decreased left ventricular diastolic compliance and/or increased left atrial pressure. - Mitral valve: There was mild regurgitation. - Left atrium: The atrium was mildly dilated. - Right ventricle: The cavity size was mildly dilated. Systolic function was moderately to severely reduced. - Tricuspid valve: There was moderate regurgitation. - Pulmonary arteries: PA peak pressure: 31 mm Hg (S).  Impressions:  - Technically very limited study due to poor sound wave transmission. There severe global LV dysfunction with moderate to sevre RV dysfunction.   Laboratory Data:  Chemistry Recent Labs  Lab 11/27/18 1750 11/28/18 0534  NA  146* 146*  K 4.0 3.5  CL 112* 112*  CO2 24 22  GLUCOSE 101* 117*  BUN 17 19  CREATININE 1.39* 1.47*  CALCIUM 9.1 9.0  GFRNONAA 39* 37*  GFRAA 45* 42*  ANIONGAP 10 12    Recent Labs  Lab 11/27/18 1750  PROT 7.1  ALBUMIN 3.8  AST 26  ALT 68*  ALKPHOS 145*  BILITOT 1.9*   Hematology Recent Labs  Lab 11/27/18 1607  WBC 8.0  RBC 4.66  HGB 14.1  HCT 46.2*  MCV 99.1  MCH 30.3  MCHC 30.5  RDW 14.9  PLT 312   Cardiac Enzymes Recent Labs  Lab 11/27/18 1607  TROPONINI <0.03   No results for input(s): TROPIPOC in the last 168 hours.  BNP Recent Labs  Lab 11/27/18 1607  BNP 1,435.0*    DDimer No results for input(s): DDIMER in the last 168 hours.  Radiology/Studies:  Dg Chest 2 View  Result Date: 11/27/2018 CLINICAL DATA:  Productive cough with fatigue for 2 weeks. History of congestive heart failure and hypertension. EXAM: CHEST - 2 VIEW COMPARISON:  03/10/2018 and 11/18/2017 radiographs. FINDINGS: Stable cardiomegaly, chronic vascular congestion and blunting of the right costophrenic angle. No airspace disease, enlarging pleural effusion or pneumothorax. The bones appear unchanged. IMPRESSION: Stable chronic findings including cardiomegaly and vascular congestion. No evidence of pneumonia. Electronically Signed   By: Carey Bullocks M.D.   On: 11/27/2018  15:09    Assessment and Plan:   1. Acute systolic HF and diastolic HF on chronic- BNP was 1435  Neg 465, continue diuresis.  Baseline dry wt is 206 LBS today 218.9   Having Echo now.  Dr. Royann Shiversroitoru to see. 2. ICM - had tried entrsto in past and refused to re-try in August.  3. CKD-3  Not on ARB due to this 4. CAD with hx of STEMI ant wall 05/03/14 and after stopping meds. Recurrent MI 05/09/14 with aspiration thrombectomy of the LAD and PCI of LAD. No chest pain.  Will check troponin for follow up 5. HTN poorly controlled.  If no improvement would increase BB and perhaps add ARB, she has not wanted entresto.   6. Tobacco use, discussed stopping       For questions or updates, please contact [facility] Please consult [email or web address] for contact info under     Signed, Nada BoozerLaura Ingold, NP  11/28/2018 8:37 AM   I have seen and examined the patient along with Nada BoozerLaura Ingold, NP .  I have reviewed the chart, notes and new data.  I agree with PA/NP's note.  Key new complaints: Breathing is much better and cough has largely resolved.  She is convinced the antibiotic helped her cough, but the rapidity of symptom resolution suggests it might have been a symptom of heart failure. Key examination changes: Clear lungs bilaterally, laterally displaced apical impulse, S3 present, 1-2+ ankle edema symmetrically Key new findings / data: echo performed today shows biventricular dysfunction and elevated filling pressures   - Left ventricle: The cavity size was normal. Wall thickness was   increased in a pattern of mild LVH. The estimated ejection   fraction was 15%. Diffuse hypokinesis. No mural thrombus by   Definity contrast. Doppler parameters are consistent with   restrictive left ventricular relaxation (grade 3 diastolic   dysfunction). The E/e&' ratio is >20, suggesting markedly elevated   LV filling pressure. - Mitral valve: Mildly thickened leaflets . There was moderate   regurgitation. - Left atrium: The atrium was normal in size. - Right ventricle: The cavity size was moderately dilated. Mild   systolic dysfunction. - Right atrium: Severely dilated. - Tricuspid valve: There was moderate regurgitation. - Pulmonary arteries: PA peak pressure: 53 mm Hg (S). - Inferior vena cava: The vessel was dilated. The respirophasic   diameter changes were blunted (< 50%), consistent with elevated   central venous pressure.  Impressions:  - Compared to a prior study in 11/2017, the LVEF is unchanged at   15%. Left and right heart pressures are quite elevated.     PLAN:  Previous estimated "dry  weight" was estimated at last hospital discharge to be 192 pounds, but she has weighed as little as 178 pounds this year. She weighed 181 pounds at her last visit in the heart failure clinic on August 26. She is at least 27 pounds, possibly as much as 40 pounds over her dry weight. Continue hydralazine/nitrates, spironolactone, carvedilol in addition to metolazone/torsemide diuretic combination.  Thurmon FairMihai Wynton Hufstetler, MD, [facility] [phone] 11/28/2018, 12:16 PM

## 2018-11-28 NOTE — Care Management Obs Status (Signed)
MEDICARE OBSERVATION STATUS NOTIFICATION   Patient Details  Name: Deborah Ho MRN: [id number] Date of Birth: 26-Jun-1951   Medicare Observation Status Notification Given:  Yes    Lanier Clam, RN 11/28/2018, 3:28 PM

## 2018-11-29 LAB — BASIC METABOLIC PANEL
Anion gap: 12 (ref 5–15)
BUN: 21 mg/dL (ref 8–23)
CO2: 27 mmol/L (ref 22–32)
Calcium: 8.8 mg/dL — ABNORMAL LOW (ref 8.9–10.3)
Chloride: 107 mmol/L (ref 98–111)
Creatinine, Ser: 1.5 mg/dL — ABNORMAL HIGH (ref 0.44–1.00)
GFR calc Af Amer: 41 mL/min — ABNORMAL LOW (ref >60)
GFR calc non Af Amer: 36 mL/min — ABNORMAL LOW (ref >60)
Glucose, Bld: 106 mg/dL — ABNORMAL HIGH (ref 70–99)
Potassium: 3.8 mmol/L (ref 3.5–5.1)
Sodium: 146 mmol/L — ABNORMAL HIGH (ref 135–145)

## 2018-11-29 LAB — HIV ANTIBODY (ROUTINE TESTING W REFLEX): HIV Screen 4th Generation wRfx: NONREACTIVE

## 2018-11-29 LAB — URINE CULTURE

## 2018-11-29 NOTE — Progress Notes (Signed)
Patient Demographics:    Deborah Ho, is a 68 y.o. female, DOB - 04-11-1951, JOA:[id number]  Admit date - 11/27/2018   Admitting Physician Hillary Bow, DO  Outpatient Primary MD for the patient is Grayce Sessions, NP  LOS - 1   Chief Complaint  Patient presents with  . Cough        Subjective:    Deborah Ho today has no fevers, no emesis,  No chest pain, orthopnea improving, shortness of breath improving, dyspnea on exertion persist, fatigue persist, dry cough is improving  Assessment  & Plan :    Principal Problem:   Acute on chronic systolic CHF (congestive heart failure) (HCC) Active Problems:   Cardiomyopathy, ischemic   Essential hypertension   CKD (chronic kidney disease), stage III (HCC)   Acute exacerbation of CHF (congestive heart failure) (HCC)  Brief Summary Deborah Ho is a 68 y.o. female with a hx of HTN, HLD, tobacco use, CAD with hx Ant STEMI with early stent thrombosis for missing doses of Brilinta and had PCI X 2 to LAD 2015, chronic systolic HF with ICM at one point 10-15%-admitted on 11/27/2018 with shortness of breath with clinical exam and radiological findings consistent with Acute CHF exacerbation   Plan:- 1)HFrEF--- acute on chronic systolic dysfunction CHF exacerbation, repeat echo with EF in the 15% range largely unchanged from prior echo, most recent weight at the heart failure clinic on 07/12/2019  was 181 pounds, patient is at least 27 pounds and possibly up to 40 pounds above her dry weight at this time--- cardiology consult appreciated, continue isosorbide 15 mg daily along with hydralazine 25 mg 3 times daily , Lasix 80 mg IV twice daily, Aldactone 12.5 mg daily, avoid ACEI/ARB/ARNI due to renal concerns, echo from 11/28/2018 sadly continues to show EF around 15% largely unchanged from prior echo, overall diuresing well, weight is not accurate, but a  fluid balance is negative over 3400 mL since admission, continue current diuretic dose  2) possible UTI--- urine culture is negative, okay to stop  IV Rocephin  3)HTN----BP not at goal, will consider increasing Coreg and Aldactone once patient's weight is close to 190lb (closer to be euvolemic) , c/n hydralazine/isosorbide combo as above  4) stage III CKD----stable, baseline creatinine usually around 1.6, creatinine was 1.39 on admission reflecting possible volume overload/dilutional, with diuresis creatinine is now up to 1.5,      , renally adjust medications, avoid nephrotoxic agents/dehydration/hypotension, monitor renal function and electrolytes closely while diuresing patient  5)H/o CAD--- stable, troponin not elevated, no chest pains no ACS symptoms, continue Lipitor 40 mg daily, Coreg 6.25 mg, continue Brilinta, isosorbide 50 mg daily  Disposition/Need for in-Hospital Stay- patient unable to be discharged at this time due to significant volume overload with more than 27 pound weight gain requiring IV diuresis with close monitoring of renal function and electrolytes while diuresing patient, she remains symptomatic with shortness of breath, significant dyspnea on exertion and orthopnea  Code Status : full  Family Communication:   none   Disposition Plan  : TBD--likely home query with home health  Consults  :  cardiology  DVT Prophylaxis  :  Lovenox   Lab Results  Component Value Date   PLT 312  11/27/2018    Inpatient Medications  Scheduled Meds: . atorvastatin  40 mg Oral q1800  . carvedilol  6.25 mg Oral BID WC  . enoxaparin (LOVENOX) injection  40 mg Subcutaneous Q24H  . ferrous sulfate  325 mg Oral 3 times per day on Mon Wed Fri  . furosemide  80 mg Intravenous BID  . hydrALAZINE  25 mg Oral TID  . isosorbide mononitrate  15 mg Oral Daily  . potassium chloride SA  40 mEq Oral BID  . sodium chloride flush  3 mL Intravenous Q12H  . spironolactone  12.5 mg Oral Daily  .  ticagrelor  60 mg Oral BID   Continuous Infusions: . sodium chloride     PRN Meds:.sodium chloride, acetaminophen, albuterol, fluticasone, ondansetron (ZOFRAN) IV, sodium chloride flush    Anti-infectives (From admission, onward)   Start     Dose/Rate Route Frequency Ordered Stop   11/28/18 1630  cefTRIAXone (ROCEPHIN) 1 g in sodium chloride 0.9 % 100 mL IVPB  Status:  Discontinued     1 g 200 mL/hr over 30 Minutes Intravenous Every 24 hours 11/28/18 1609 11/29/18 0934   11/27/18 2145  cefTRIAXone (ROCEPHIN) 1 g in sodium chloride 0.9 % 100 mL IVPB     1 g 200 mL/hr over 30 Minutes Intravenous  Once 11/27/18 2134 11/28/18 0803        Objective:   Vitals:   11/28/18 1936 11/29/18 0400 11/29/18 0800 11/29/18 1348  BP: 132/87 (!) 147/91  127/77  Pulse: 95 83  90  Resp: 15 16  20   Temp: (!) 97.5 F (36.4 C) 98 F (36.7 C)  98 F (36.7 C)  TempSrc: Oral Oral  Oral  SpO2: 96% 94%  94%  Weight:   97.1 kg   Height:        Wt Readings from Last 3 Encounters:  11/29/18 97.1 kg  07/11/18 82.1 kg  06/16/18 85.7 kg     Intake/Output Summary (Last 24 hours) at 11/29/2018 1938 Last data filed at 11/29/2018 1434 Gross per 24 hour  Intake 220 ml  Output 3751 ml  Net -3531 ml     Physical Exam Patient is examined daily including today on 11/29/2018 , exams remain the same as of yesterday except that has changed   Gen:- Awake Alert,  Obese, speaking short sentences  HEENT:- [place].AT, No sclera icterus Neck-Supple Neck, +ve JVD,.  Lungs-diminished in bases with faint bibasilar rales CV- S1, S2 normal, regular  Abd-  +ve B.Sounds, Abd Soft, No tenderness,    Extremity/Skin:- 1+  edema, pedal pulses present  Psych-affect is appropriate, oriented x3 Neuro-no new focal deficits, no tremors   Data Review:   Micro Results Recent Results (from the past 240 hour(s))  Urine culture     Status: None   Collection Time: 11/27/18  4:08 PM  Result Value Ref Range Status   Specimen  Description   Final    URINE, CLEAN CATCH Performed at [facility], [address], [place], [place] [address]    Special Requests   Final    NONE Performed at [facility], [address] [place], [place] [address]    Culture   Final    Multiple bacterial morphotypes present, none predominant. Suggest appropriate recollection if clinically indicated.   Report Status 11/29/2018 FINAL  Final    Radiology Reports Dg Chest 2 View  Result Date: 11/27/2018 CLINICAL DATA:  Productive cough with fatigue for 2 weeks. History  of congestive heart failure and hypertension. EXAM: CHEST - 2 VIEW COMPARISON:  03/10/2018 and 11/18/2017 radiographs. FINDINGS: Stable cardiomegaly, chronic vascular congestion and blunting of the right costophrenic angle. No airspace disease, enlarging pleural effusion or pneumothorax. The bones appear unchanged. IMPRESSION: Stable chronic findings including cardiomegaly and vascular congestion. No evidence of pneumonia. Electronically Signed   By: Carey BullocksWilliam  Veazey M.D.   On: 11/27/2018 15:09     CBC Recent Labs  Lab 11/27/18 1607  WBC 8.0  HGB 14.1  HCT 46.2*  PLT 312  MCV 99.1  MCH 30.3  MCHC 30.5  RDW 14.9  LYMPHSABS 2.2  MONOABS 0.7  EOSABS 0.1  BASOSABS 0.1    Chemistries  Recent Labs  Lab 11/27/18 1750 11/28/18 0534 11/29/18 0654  NA 146* 146* 146*  K 4.0 3.5 3.8  CL 112* 112* 107  CO2 24 22 27   GLUCOSE 101* 117* 106*  BUN 17 19 21   CREATININE 1.39* 1.47* 1.50*  CALCIUM 9.1 9.0 8.8*  AST 26  --   --   ALT 68*  --   --   ALKPHOS 145*  --   --   BILITOT 1.9*  --   --     Lab Results  Component Value Date   HGBA1C 6.2 (H) 09/29/2017   Cardiac Enzymes Recent Labs  Lab 11/27/18 1607 11/28/18 1014  TROPONINI <0.03 0.03*   ------------------------------------------------------------------------------------------------------------------    Component Value Date/Time   BNP 1,435.0 (H) 11/27/2018  1607   BNP 2,344.6 (H) 04/24/2015 1350     Michelle Wnek M.D on 11/29/2018 at 7:38 PM  Go to [email or web address] - for contact info  [facility] - Office  [phone]

## 2018-11-29 NOTE — Progress Notes (Addendum)
Progress Note  Patient Name: Deborah Ho Date of Encounter: 11/29/2018  Primary Cardiologist: Peter Swaziland, MD  CHF Dr Shirlee Latch   Subjective   No chest pain, cough is better   Inpatient Medications    Scheduled Meds: . atorvastatin  40 mg Oral q1800  . carvedilol  6.25 mg Oral BID WC  . enoxaparin (LOVENOX) injection  40 mg Subcutaneous Q24H  . ferrous sulfate  325 mg Oral 3 times per day on Mon Wed Fri  . furosemide  80 mg Intravenous BID  . hydrALAZINE  25 mg Oral TID  . isosorbide mononitrate  15 mg Oral Daily  . potassium chloride SA  40 mEq Oral BID  . sodium chloride flush  3 mL Intravenous Q12H  . spironolactone  12.5 mg Oral Daily  . ticagrelor  60 mg Oral BID   Continuous Infusions: . sodium chloride     PRN Meds: sodium chloride, acetaminophen, albuterol, fluticasone, ondansetron (ZOFRAN) IV, sodium chloride flush   Vital Signs    Vitals:   11/28/18 0404 11/28/18 1429 11/28/18 1936 11/29/18 0400  BP: (!) 148/95 (!) 155/103 132/87 (!) 147/91  Pulse: 96 95 95 83  Resp: 18 18 15 16   Temp: 97.7 F (36.5 C) 98.3 F (36.8 C) (!) 97.5 F (36.4 C) 98 F (36.7 C)  TempSrc: Oral Oral Oral Oral  SpO2: 98% 97% 96% 94%  Weight: 99.5 kg     Height:        Intake/Output Summary (Last 24 hours) at 11/29/2018 1122 Last data filed at 11/29/2018 0200 Gross per 24 hour  Intake 120 ml  Output 2300 ml  Net -2180 ml   Last 3 Weights 11/28/2018 11/27/2018 07/11/2018  Weight (lbs) 219 lb 6.4 oz 222 lb 9.6 oz 181 lb  Weight (kg) 99.519 kg 100.971 kg 82.101 kg      Telemetry    SR with occ PVCs  - Personally Reviewed  ECG    No new - Personally Reviewed  Physical Exam   GEN: No acute distress.   Neck: No JVD sitting up Cardiac: RRR, no murmurs, rubs, or gallops.  Respiratory: diminished to auscultation bilaterally. GI: Soft, nontender, non-distended  MS: 2+ edema; No deformity. Neuro:  Nonfocal  Psych: Normal affect   Labs    Chemistry Recent Labs    Lab 11/27/18 1750 11/28/18 0534 11/29/18 0654  NA 146* 146* 146*  K 4.0 3.5 3.8  CL 112* 112* 107  CO2 24 22 27   GLUCOSE 101* 117* 106*  BUN 17 19 21   CREATININE 1.39* 1.47* 1.50*  CALCIUM 9.1 9.0 8.8*  PROT 7.1  --   --   ALBUMIN 3.8  --   --   AST 26  --   --   ALT 68*  --   --   ALKPHOS 145*  --   --   BILITOT 1.9*  --   --   GFRNONAA 39* 37* 36*  GFRAA 45* 42* 41*  ANIONGAP 10 12 12      Hematology Recent Labs  Lab 11/27/18 1607  WBC 8.0  RBC 4.66  HGB 14.1  HCT 46.2*  MCV 99.1  MCH 30.3  MCHC 30.5  RDW 14.9  PLT 312    Cardiac Enzymes Recent Labs  Lab 11/27/18 1607 11/28/18 1014  TROPONINI <0.03 0.03*   No results for input(s): TROPIPOC in the last 168 hours.   BNP Recent Labs  Lab 11/27/18 1607  BNP 1,435.0*     DDimer  No results for input(s): DDIMER in the last 168 hours.   Radiology    Dg Chest 2 View  Result Date: 11/27/2018 CLINICAL DATA:  Productive cough with fatigue for 2 weeks. History of congestive heart failure and hypertension. EXAM: CHEST - 2 VIEW COMPARISON:  03/10/2018 and 11/18/2017 radiographs. FINDINGS: Stable cardiomegaly, chronic vascular congestion and blunting of the right costophrenic angle. No airspace disease, enlarging pleural effusion or pneumothorax. The bones appear unchanged. IMPRESSION: Stable chronic findings including cardiomegaly and vascular congestion. No evidence of pneumonia. Electronically Signed   By: Carey Bullocks M.D.   On: 11/27/2018 15:09    Cardiac Studies   Echo 11/28/18 Study Conclusions  - Left ventricle: The cavity size was normal. Wall thickness was   increased in a pattern of mild LVH. The estimated ejection   fraction was 15%. Diffuse hypokinesis. No mural thrombus by   Definity contrast. Doppler parameters are consistent with   restrictive left ventricular relaxation (grade 3 diastolic   dysfunction). The E/e&' ratio is >20, suggesting markedly elevated   LV filling pressure. -  Mitral valve: Mildly thickened leaflets . There was moderate   regurgitation. - Left atrium: The atrium was normal in size. - Right ventricle: The cavity size was moderately dilated. Mild   systolic dysfunction. - Right atrium: Severely dilated. - Tricuspid valve: There was moderate regurgitation. - Pulmonary arteries: PA peak pressure: 53 mm Hg (S). - Inferior vena cava: The vessel was dilated. The respirophasic   diameter changes were blunted (< 50%), consistent with elevated   central venous pressure.  Impressions:  - Compared to a prior study in 11/2017, the LVEF is unchanged at   15%. Left and right heart pressures are quite elevated.   Echo 11/21/17  Study Conclusions  - Left ventricle: The cavity size was mildly dilated. Wall   thickness was normal. The estimated ejection fraction was in the   range of 10% to 15%. Doppler parameters are consistent with   restrictive physiology, indicative of decreased left ventricular   diastolic compliance and/or increased left atrial pressure. - Mitral valve: There was mild regurgitation. - Left atrium: The atrium was mildly dilated. - Right ventricle: The cavity size was mildly dilated. Systolic   function was moderately to severely reduced. - Tricuspid valve: There was moderate regurgitation. - Pulmonary arteries: PA peak pressure: 31 mm Hg (S).  Impressions:  - Technically very limited study due to poor sound wave   transmission. There severe global LV dysfunction with moderate to   sevre RV dysfunction.  Patient Profile     68 y.o. female with a hx of HTN, HLD, tobacco use, CAD with hx Ant STEMI with early stent thrombosis for missing doses of Brilinta and had PCI X 2 to LAD 2015, chronic systolic HF with ICM at one point 10-15%.  Now admitted for CHF with bronchitis, though pt feels ABX made her feel better.  Her wt. Is up.     Assessment & Plan    Acute systolic HF and diastolic component BNP 1435  --neg 3405 since  admit, no weight today  On lasix 80 mg BID would decrease with rising Cr will defer to Dr. Royann Shivers  --Cr trending up to 1.50 today was 1.39 on admit --wt up significantly  -- will need follow up with advanced HF clinic TOC  ICM with continued low EF and now increase of MR.    --now need for ICD?  CAD with hx of STEMI and with noncompliance thrombus  and further infarction.in 2015.  Neg troponin continue brilinta and ASA  On imdur  HTN BP still elevated 147/91 on coreg 6.25 BID   Tobacco use  She tells me she no longer uses   HLD on statin continue          For questions or updates, please contact [facility] Please consult [email or web address] for contact info under        Signed, Nada BoozerLaura Ingold, NP  11/29/2018, 11:22 AM    I have seen and examined the patient along with Nada BoozerLaura Ingold, NP.  I have reviewed the chart, notes and new data.  I agree with PA/NP's note.  Key new complaints: Breathing has improved and cough is subsiding. Key examination changes: Excellent diuresis.  Weight not well documented.  Blood pressure remains elevated. Key new findings / data: Increase in creatinine compared to baseline is mild.    PLAN: Continue current diuretic dose for at least another 24 hours. Need accurate daily weights.  Ideally, we will bring weight down to about 180, but at least should bring her down to her previous discharge weight of 192 pounds. Once closer to euvolemia can try to increase carvedilol and/or spironolactone.   Thurmon FairMihai Keanthony Poole, MD, [facility]ACC [facility] [phone] 11/29/2018, 1:35 PM\

## 2018-11-30 LAB — BASIC METABOLIC PANEL
Anion gap: 13 (ref 5–15)
BUN: 25 mg/dL — ABNORMAL HIGH (ref 8–23)
CO2: 28 mmol/L (ref 22–32)
Calcium: 9 mg/dL (ref 8.9–10.3)
Chloride: 104 mmol/L (ref 98–111)
Creatinine, Ser: 1.48 mg/dL — ABNORMAL HIGH (ref 0.44–1.00)
GFR calc Af Amer: 42 mL/min — ABNORMAL LOW (ref >60)
GFR calc non Af Amer: 36 mL/min — ABNORMAL LOW (ref >60)
Glucose, Bld: 98 mg/dL (ref 70–99)
Potassium: 4.1 mmol/L (ref 3.5–5.1)
Sodium: 145 mmol/L (ref 135–145)

## 2018-11-30 MED ORDER — LIVING BETTER WITH HEART FAILURE BOOK
Freq: Once | Status: AC
  Administered 2018-11-30: 15:00:00

## 2018-11-30 MED ORDER — BENZONATATE 100 MG PO CAPS
100.0000 mg | ORAL_CAPSULE | Freq: Three times a day (TID) | ORAL | Status: DC | PRN
  Administered 2018-11-30 – 2018-12-04 (×7): 100 mg via ORAL
  Filled 2018-11-30 (×7): qty 1

## 2018-11-30 MED ORDER — SPIRONOLACTONE 25 MG PO TABS
25.0000 mg | ORAL_TABLET | Freq: Every day | ORAL | Status: DC
  Administered 2018-12-01 – 2018-12-04 (×4): 25 mg via ORAL
  Filled 2018-11-30 (×4): qty 1

## 2018-11-30 NOTE — Progress Notes (Signed)
24 urine started at 1720. To be collected on 12/01/18 at [facility]

## 2018-11-30 NOTE — Progress Notes (Signed)
PROGRESS NOTE    Deborah Ho  ZOX:[id number] DOB: 12/11/1950 DOA: 11/27/2018 PCP: Grayce SessionsEdwards, Michelle P, NP   Brief Narrative: 68 y.o.femalewith a hx of HTN, HLD, tobacco use, CAD with hx Ant STEMI with early stent thrombosis for missing doses of Brilinta and had PCI X 2 to LAD 2015, chronic systolic HF with ICM at one point 10-15%-admitted on 11/27/2018 with shortness of breath with clinical exam and radiological findings consistent with Acute CHF exacerbation  Assessment & Plan:   Principal Problem:   Acute on chronic systolic CHF (congestive heart failure) (HCC) Active Problems:   Cardiomyopathy, ischemic   Essential hypertension   CKD (chronic kidney disease), stage III (HCC)   Acute exacerbation of CHF (congestive heart failure) (HCC)  1)HFrEF--- acute on chronic systolic dysfunction CHF exacerbation, repeat echo with EF in the 15% range largely unchanged from prior echo, most recent weight at the heart failure clinic on 07/12/2019  was 181 pounds, patient is at least 27 pounds and possibly up to 40 pounds above her dry weight at this time--- cardiology consult appreciated, continue isosorbide 15 mg daily along with hydralazine 25 mg 3 times daily , Lasix 80 mg IV twice daily, Aldactone 12.5 mg daily, avoid ACEI/ARB/ARNI due to renal concerns, echo from 11/28/2018  continues to show EF around 15% largely unchanged from prior echo, overall diuresing well, weight is not accurate, but a fluid balance is negative over one litre   2) possible UTI--- urine culture is negative, stop to Rocephin.  3)HTN----BP not at goal, will consider increasing Coreg and Aldactone once patient's weight is close to 190lb (closer to be euvolemic) , c/n hydralazine/isosorbide combo as above  4) stage III CKD----stable, baseline creatinine usually around 1.6, creatinine was 1.39 on admission reflecting possible volume overload/dilutional, with diuresis creatinine is now up to 1.5,      , renally adjust  medications, avoid nephrotoxic agents/dehydration/hypotension, monitor renal function and electrolytes closely while diuresing patient  5)H/o CAD--- stable, troponin not elevated, no chest pains no ACS symptoms, continue Lipitor 40 mg daily, Coreg 6.25 mg, continue Brilinta, isosorbide 50 mg     Estimated body mass index is 35.07 kg/m as calculated from the following:   Height as of this encounter: 5' 5.5" (1.664 m).   Weight as of this encounter: 97.1 kg.  DVT prophylaxis: Lovenox but patient refusing Lovenox Code Status: Full code Family Communication: None Disposition Plan pending clinical improvement and cardiology clearance Consultants:  Cardiology Procedures none Antimicrobials none  Subjective: Sitting by the side of the bed complains of cough not able to bring up phlegm she is allergic to a lot of medications including Mucinex and aspirin she reports her tongue swells with him.  Objective: Vitals:   11/29/18 0800 11/29/18 1348 11/29/18 2216 11/30/18 0426  BP:  127/77 128/78 137/90  Pulse:  90 89 91  Resp:  20  16  Temp:  98 F (36.7 C) 98.4 F (36.9 C) 98.2 F (36.8 C)  TempSrc:  Oral Oral Oral  SpO2:  94% 91% 93%  Weight: 97.1 kg     Height:        Intake/Output Summary (Last 24 hours) at 11/30/2018 1115 Last data filed at 11/29/2018 2116 Gross per 24 hour  Intake -  Output 1251 ml  Net -1251 ml   Filed Weights   11/27/18 2312 11/28/18 0404 11/29/18 0800  Weight: 101 kg 99.5 kg 97.1 kg    Examination:  General exam: Appears calm and comfortable  Respiratory  system: Crackles bilateral bases to auscultation. Respiratory effort normal. Cardiovascular system: S1 & S2 heard, RRR. No JVD, murmurs, rubs, gallops or clicks. No pedal edema. Gastrointestinal system: Abdomen is nondistended, soft and nontender. No organomegaly or masses felt. Normal bowel sounds heard. Central nervous system: Alert and oriented. No focal neurological deficits. Extremities: 2+  pitting edema Skin: No rashes, lesions or ulcers Psychiatry: Judgement and insight appear normal. Mood & affect appropriate.     Data Reviewed: I have personally reviewed following labs and imaging studies  CBC: Recent Labs  Lab 11/27/18 1607  WBC 8.0  NEUTROABS 4.9  HGB 14.1  HCT 46.2*  MCV 99.1  PLT 312   Basic Metabolic Panel: Recent Labs  Lab 11/27/18 1750 11/28/18 0534 11/29/18 0654 11/30/18 0535  NA 146* 146* 146* 145  K 4.0 3.5 3.8 4.1  CL 112* 112* 107 104  CO2 24 22 27 28   GLUCOSE 101* 117* 106* 98  BUN 17 19 21  25*  CREATININE 1.39* 1.47* 1.50* 1.48*  CALCIUM 9.1 9.0 8.8* 9.0   GFR: Estimated Creatinine Clearance: 43 mL/min (A) (by C-G formula based on SCr of 1.48 mg/dL (H)). Liver Function Tests: Recent Labs  Lab 11/27/18 1750  AST 26  ALT 68*  ALKPHOS 145*  BILITOT 1.9*  PROT 7.1  ALBUMIN 3.8   No results for input(s): LIPASE, AMYLASE in the last 168 hours. No results for input(s): AMMONIA in the last 168 hours. Coagulation Profile: No results for input(s): INR, PROTIME in the last 168 hours. Cardiac Enzymes: Recent Labs  Lab 11/27/18 1607 11/28/18 1014  TROPONINI <0.03 0.03*   BNP (last 3 results) No results for input(s): PROBNP in the last 8760 hours. HbA1C: No results for input(s): HGBA1C in the last 72 hours. CBG: No results for input(s): GLUCAP in the last 168 hours. Lipid Profile: No results for input(s): CHOL, HDL, LDLCALC, TRIG, CHOLHDL, LDLDIRECT in the last 72 hours. Thyroid Function Tests: No results for input(s): TSH, T4TOTAL, FREET4, T3FREE, THYROIDAB in the last 72 hours. Anemia Panel: No results for input(s): VITAMINB12, FOLATE, FERRITIN, TIBC, IRON, RETICCTPCT in the last 72 hours. Sepsis Labs: No results for input(s): PROCALCITON, LATICACIDVEN in the last 168 hours.  Recent Results (from the past 240 hour(s))  Urine culture     Status: None   Collection Time: 11/27/18  4:08 PM  Result Value Ref Range Status    Specimen Description   Final    URINE, CLEAN CATCH Performed at [facility], [address], [place], [place] [address]    Special Requests   Final    NONE Performed at [facility], [address] [place], [place] [address]    Culture   Final    Multiple bacterial morphotypes present, none predominant. Suggest appropriate recollection if clinically indicated.   Report Status 11/29/2018 FINAL  Final         Radiology Studies: No results found.      Scheduled Meds: . atorvastatin  40 mg Oral q1800  . carvedilol  6.25 mg Oral BID WC  . enoxaparin (LOVENOX) injection  40 mg Subcutaneous Q24H  . ferrous sulfate  325 mg Oral 3 times per day on Mon Wed Fri  . furosemide  80 mg Intravenous BID  . hydrALAZINE  25 mg Oral TID  . isosorbide mononitrate  15 mg Oral Daily  . potassium chloride SA  40 mEq Oral BID  . sodium chloride flush  3 mL Intravenous Q12H  .  spironolactone  12.5 mg Oral Daily  . ticagrelor  60 mg Oral BID   Continuous Infusions: . sodium chloride       LOS: 2 days     Alwyn Ren, MD [facility] If 7PM-7AM, please contact night-coverage [email or web address] Password [email or web address] 11/30/2018, 11:15 AM

## 2018-11-30 NOTE — Progress Notes (Addendum)
Progress Note  Patient Name: Deborah Ho Date of Encounter: 11/30/2018  Primary Cardiologist: Peter Swaziland, MD/CHF clinic  Subjective   She reports cough persists, no chest pain. SOB improving. LEE persists but improving. She had not been weighing recently.  Inpatient Medications    Scheduled Meds: . atorvastatin  40 mg Oral q1800  . carvedilol  6.25 mg Oral BID WC  . enoxaparin (LOVENOX) injection  40 mg Subcutaneous Q24H  . ferrous sulfate  325 mg Oral 3 times per day on Mon Wed Fri  . furosemide  80 mg Intravenous BID  . hydrALAZINE  25 mg Oral TID  . isosorbide mononitrate  15 mg Oral Daily  . potassium chloride SA  40 mEq Oral BID  . sodium chloride flush  3 mL Intravenous Q12H  . spironolactone  12.5 mg Oral Daily  . ticagrelor  60 mg Oral BID   Continuous Infusions: . sodium chloride     PRN Meds: sodium chloride, acetaminophen, albuterol, fluticasone, ondansetron (ZOFRAN) IV, sodium chloride flush   Vital Signs    Vitals:   11/29/18 0800 11/29/18 1348 11/29/18 2216 11/30/18 0426  BP:  127/77 128/78 137/90  Pulse:  90 89 91  Resp:  20  16  Temp:  98 F (36.7 C) 98.4 F (36.9 C) 98.2 F (36.8 C)  TempSrc:  Oral Oral Oral  SpO2:  94% 91% 93%  Weight: 97.1 kg     Height:        Intake/Output Summary (Last 24 hours) at 11/30/2018 1232 Last data filed at 11/29/2018 2116 Gross per 24 hour  Intake -  Output 751 ml  Net -751 ml   Last 3 Weights 11/29/2018 11/28/2018 11/27/2018  Weight (lbs) 214 lb 219 lb 6.4 oz 222 lb 9.6 oz  Weight (kg) 97.07 kg 99.519 kg 100.971 kg     Telemetry    NSR - Personally Reviewed  Physical Exam   GEN: No acute distress, obese AAF  HEENT: Normocephalic, atraumatic, sclera non-icteric. Sounds stuffy Neck: No JVD or bruits. Cardiac: RRR no murmurs, rubs, or gallops.  Radials/DP/PT 1+ and equal bilaterally.  Respiratory: Coarse BS bilaterally, decreased BS at bases. Breathing is unlabored. GI: Soft, nontender,  non-distended, BS +x 4. MS: no deformity. Extremities: No clubbing or cyanosis. 1+ shiny bilateral lower ext edema, pitting Neuro:  AAOx3. Follows commands. Psych:  Responds to questions appropriately with a normal affect.  Labs    Chemistry Recent Labs  Lab 11/27/18 1750 11/28/18 0534 11/29/18 0654 11/30/18 0535  NA 146* 146* 146* 145  K 4.0 3.5 3.8 4.1  CL 112* 112* 107 104  CO2 24 22 27 28   GLUCOSE 101* 117* 106* 98  BUN 17 19 21  25*  CREATININE 1.39* 1.47* 1.50* 1.48*  CALCIUM 9.1 9.0 8.8* 9.0  PROT 7.1  --   --   --   ALBUMIN 3.8  --   --   --   AST 26  --   --   --   ALT 68*  --   --   --   ALKPHOS 145*  --   --   --   BILITOT 1.9*  --   --   --   GFRNONAA 39* 37* 36* 36*  GFRAA 45* 42* 41* 42*  ANIONGAP 10 12 12 13      Hematology Recent Labs  Lab 11/27/18 1607  WBC 8.0  RBC 4.66  HGB 14.1  HCT 46.2*  MCV 99.1  MCH 30.3  MCHC 30.5  RDW 14.9  PLT 312    Cardiac Enzymes Recent Labs  Lab 11/27/18 1607 11/28/18 1014  TROPONINI <0.03 0.03*   No results for input(s): TROPIPOC in the last 168 hours.   BNP Recent Labs  Lab 11/27/18 1607  BNP 1,435.0*     DDimer No results for input(s): DDIMER in the last 168 hours.   Radiology    No results found.  Cardiac Studies   2d echo 11/28/18 Study Conclusions  - Left ventricle: The cavity size was normal. Wall thickness was   increased in a pattern of mild LVH. The estimated ejection   fraction was 15%. Diffuse hypokinesis. No mural thrombus by   Definity contrast. Doppler parameters are consistent with   restrictive left ventricular relaxation (grade 3 diastolic   dysfunction). The E/e&' ratio is >20, suggesting markedly elevated   LV filling pressure. - Mitral valve: Mildly thickened leaflets . There was moderate   regurgitation. - Left atrium: The atrium was normal in size. - Right ventricle: The cavity size was moderately dilated. Mild   systolic dysfunction. - Right atrium: Severely  dilated. - Tricuspid valve: There was moderate regurgitation. - Pulmonary arteries: PA peak pressure: 53 mm Hg (S). - Inferior vena cava: The vessel was dilated. The respirophasic   diameter changes were blunted (< 50%), consistent with elevated   central venous pressure.  Impressions:  - Compared to a prior study in 11/2017, the LVEF is unchanged at   15%. Left and right heart pressures are quite elevated.   Patient Profile     68 y.o. female with history of CAD with anterior STEMI with early stent thrombosis for missing doses of Brilinta and had PCI X 2 to LAD 2015, CKD stage III, HTN, HLD, ischemic cardiomyopathy/chronic combined CHF, noncompliance, obesity. She has been followed by the CHF clinic but no-showed for f/u echocardiogram and follow-up. At last HF visit 06/2018, 1) she could not afford Bidil due to cost, 2) refused re-challenge with Entresto, c) refused labs and d) refused to consider ICD or other advanced therapies. [facility] SOB, cough, and fatigue x 2 weeks. 2D echo 11/28/18 showed EF 15%, grade 3 DD, moderate MR, mild RV dysfunction, moderate TR, elevated CVP. She is being treated for CHF and also was treated for bronchitis.  Assessment & Plan    1. Acute on chronic combined CHF - making steady progress on current diuretic regimen. Likely has a long way to go. -5.6L. Weight 222->214lb today. Continue current regimen. Will discuss HF med titration with MD as BP seems to be able to tolerate this. Will rx CHF booklet. Change diet to low sodium with fluid restriction. Will get dietitian to see to reinforce diet.  2. Ischemic cardiomyopathy - has refused ICD in the past. Would likely need to demonstrate compliance before this is in considered.   3. CAD - no chest pain. Troponin 0.03. Note yesterday says continue ASA + Brilinta but she is only on Brilinta as she is at home.   4. CKD stage III with marked proteinuria -  Cr appears generally stable in 1.5  range. Will order 24 hour urine collection to assess proteinuria burden. >300 on UA.  4. Abnormal LFTs - ? Congestive hepatopathy. Will repeat in AM.  For questions or updates, please contact [facility] Please consult [email or web address] for contact info under Cardiology/STEMI.  Signed, Laurann Montanaayna N Dunn, PA-C 11/30/2018, 12:32 PM    I have seen and examined the  patient along with Laurann Montana, PA-C.  I have reviewed the chart, notes and new data.  I agree with PA/NP's note.  Key new complaints: Denies angina, orthopnea seems to have improved substantially, continues to have a cough. Key examination changes: Edema is improving but she still has profound abdominal distention.  She has lost another 5 pounds but still has a minimum of 25 pounds to go, maybe even 35 pounds. Key new findings / data: Normal potassium and unchanged renal function.  PLAN: Continue aggressive intravenous diuresis. I increased the spironolactone to 25 mg once daily.  Thurmon Fair, MD, [facility] [phone] 11/30/2018, 3:44 PM

## 2018-12-01 LAB — PROTEIN, URINE, 24 HOUR
Collection Interval-UPROT: 24 hours
Protein, Urine: <6 mg/dL
Urine Total Volume-UPROT: 3400 mL

## 2018-12-01 LAB — BASIC METABOLIC PANEL
Anion gap: 11 (ref 5–15)
BUN: 29 mg/dL — ABNORMAL HIGH (ref 8–23)
CO2: 28 mmol/L (ref 22–32)
Calcium: 9.1 mg/dL (ref 8.9–10.3)
Chloride: 103 mmol/L (ref 98–111)
Creatinine, Ser: 1.42 mg/dL — ABNORMAL HIGH (ref 0.44–1.00)
GFR calc Af Amer: 44 mL/min — ABNORMAL LOW (ref >60)
GFR calc non Af Amer: 38 mL/min — ABNORMAL LOW (ref >60)
Glucose, Bld: 97 mg/dL (ref 70–99)
Potassium: 4.2 mmol/L (ref 3.5–5.1)
Sodium: 142 mmol/L (ref 135–145)

## 2018-12-01 LAB — HEPATIC FUNCTION PANEL
ALT: 37 U/L (ref 0–44)
AST: 29 U/L (ref 15–41)
Albumin: 3.8 g/dL (ref 3.5–5.0)
Alkaline Phosphatase: 137 U/L — ABNORMAL HIGH (ref 38–126)
Bilirubin, Direct: 0.4 mg/dL — ABNORMAL HIGH (ref 0.0–0.2)
Indirect Bilirubin: 1.2 mg/dL — ABNORMAL HIGH (ref 0.3–0.9)
Total Bilirubin: 1.6 mg/dL — ABNORMAL HIGH (ref 0.3–1.2)
Total Protein: 7 g/dL (ref 6.5–8.1)

## 2018-12-01 MED ORDER — ISOSORBIDE MONONITRATE ER 30 MG PO TB24: 15.0000 mg | ORAL_TABLET | Freq: Every day | ORAL | 0 refills | Status: DC

## 2018-12-01 MED ORDER — SPIRONOLACTONE 25 MG PO TABS: 25.0000 mg | ORAL_TABLET | Freq: Every day | ORAL | 0 refills | Status: DC

## 2018-12-01 MED ORDER — BENZONATATE 100 MG PO CAPS: 100.0000 mg | ORAL_CAPSULE | Freq: Three times a day (TID) | ORAL | 0 refills | Status: DC | PRN

## 2018-12-01 MED ORDER — TRAMADOL HCL 50 MG PO TABS: 50.0000 mg | ORAL_TABLET | Freq: Two times a day (BID) | ORAL | 0 refills | Status: DC | PRN

## 2018-12-01 NOTE — Care Management Note (Signed)
Case Management Note  Patient Details  Name: Thomasine Beza MRN: [id number] Date of Birth: [birth year]  Subjective/Objective: CHF. Recc CHF protocal for home-patient pleasantly declines currently.                   Action/Plan:d/c plan home.   Expected Discharge Date:  (unknown)               Expected Discharge Plan:  Home w Home Health Services  In-House Referral:     Discharge planning Services  CM Consult  Post Acute Care Choice:    Choice offered to:  Patient  DME Arranged:    DME Agency:     HH Arranged:    HH Agency:     Status of Service:  In process, will continue to follow  If discussed at Long Length of Stay Meetings, dates discussed:    Additional Comments:  Lanier Clam, RN 12/01/2018, 1:22 PM

## 2018-12-01 NOTE — Progress Notes (Signed)
Took over patient care from [facility] agree with prior assessment and no new changes noted, will continue to F/U with plan of care.

## 2018-12-01 NOTE — Progress Notes (Addendum)
Progress Note  Patient Name: Deborah Ho Date of Encounter: 12/01/2018  Primary Cardiologist: Peter Swaziland, MD/CHF clinic [name], MD  Subjective   Pt breathing better, wants to go home.  After d/c in August, was making food just like the hospital. Wt stable. When she quit doing that consistently, started gaining weight. However, she is not convinced that dietary changes caused the weight gain. Feels there is something else.   Inpatient Medications    Scheduled Meds: . atorvastatin  40 mg Oral q1800  . carvedilol  6.25 mg Oral BID WC  . enoxaparin (LOVENOX) injection  40 mg Subcutaneous Q24H  . ferrous sulfate  325 mg Oral 3 times per day on Mon Wed Fri  . furosemide  80 mg Intravenous BID  . hydrALAZINE  25 mg Oral TID  . isosorbide mononitrate  15 mg Oral Daily  . potassium chloride SA  40 mEq Oral BID  . sodium chloride flush  3 mL Intravenous Q12H  . spironolactone  25 mg Oral Daily  . ticagrelor  60 mg Oral BID   Continuous Infusions: . sodium chloride     PRN Meds: sodium chloride, acetaminophen, albuterol, benzonatate, fluticasone, ondansetron (ZOFRAN) IV, sodium chloride flush   Vital Signs    Vitals:   11/30/18 1403 11/30/18 1944 12/01/18 0437 12/01/18 0440  BP: 117/73 128/70 (!) 141/77   Pulse: 79 91 87   Resp: 14 16 15    Temp: 97.8 F (36.6 C) 97.6 F (36.4 C) 98.4 F (36.9 C)   TempSrc: Oral Oral Oral   SpO2: 96% 95% 95%   Weight:    93 kg  Height:        Intake/Output Summary (Last 24 hours) at 12/01/2018 1127 Last data filed at 12/01/2018 1015 Gross per 24 hour  Intake 240 ml  Output 4250 ml  Net -4010 ml   Last 3 Weights 12/01/2018 11/29/2018 11/28/2018  Weight (lbs) 205 lb 214 lb 219 lb 6.4 oz  Weight (kg) 92.987 kg 97.07 kg 99.519 kg     Telemetry    SR, occ PVCs- Personally Reviewed  Physical Exam   General: Well developed, well nourished, female in no acute distress Head: Eyes PERRLA, No xanthomas.   Normocephalic and  atraumatic Lungs: Clear bilaterally to auscultation. Heart: HRRR S1 S2, without MRG.  Pulses are 2+ & equal. No JVD. Abdomen: Bowel sounds are present, abdomen soft and non-tender without masses or  hernias noted. Msk: Normal strength and tone for age. Extremities: No clubbing, cyanosis or edema.    Skin:  No rashes or lesions noted. Neuro: Alert and oriented X 3. Psych:  Good affect, responds appropriately  GEN: No acute distress, obese AAF  HEENT: Normocephalic, atraumatic, sclera non-icteric. Sounds stuffy Neck: No JVD or bruits. Cardiac: RRR no murmurs, rubs, or gallops.  Radials/DP/PT 1+ and equal bilaterally.  Respiratory: Coarse BS bilaterally, decreased BS at bases. Breathing is unlabored. GI: Soft, nontender, non-distended, BS +x 4. MS: no deformity. Extremities: No clubbing or cyanosis. 1+ shiny bilateral lower ext edema, pitting Neuro:  AAOx3. Follows commands. Psych:  Responds to questions appropriately with a normal affect.  Labs    Chemistry Recent Labs  Lab 11/27/18 1750  11/29/18 0654 11/30/18 0535 12/01/18 0504  NA 146*   < > 146* 145 142  K 4.0   < > 3.8 4.1 4.2  CL 112*   < > 107 104 103  CO2 24   < > 27 28 28   GLUCOSE 101*   < >  106* 98 97  BUN 17   < > 21 25* 29*  CREATININE 1.39*   < > 1.50* 1.48* 1.42*  CALCIUM 9.1   < > 8.8* 9.0 9.1  PROT 7.1  --   --   --  7.0  ALBUMIN 3.8  --   --   --  3.8  AST 26  --   --   --  29  ALT 68*  --   --   --  37  ALKPHOS 145*  --   --   --  137*  BILITOT 1.9*  --   --   --  1.6*  GFRNONAA 39*   < > 36* 36* 38*  GFRAA 45*   < > 41* 42* 44*  ANIONGAP 10   < > 12 13 11    < > = values in this interval not displayed.     Hematology Recent Labs  Lab 11/27/18 1607  WBC 8.0  RBC 4.66  HGB 14.1  HCT 46.2*  MCV 99.1  MCH 30.3  MCHC 30.5  RDW 14.9  PLT 312    Cardiac Enzymes Recent Labs  Lab 11/27/18 1607 11/28/18 1014  TROPONINI <0.03 0.03*   No results for input(s): TROPIPOC in the last 168 hours.     BNP Recent Labs  Lab 11/27/18 1607  BNP 1,435.0*     Radiology    No results found.  Cardiac Studies   2d echo 11/28/18 Study Conclusions  - Left ventricle: The cavity size was normal. Wall thickness was   increased in a pattern of mild LVH. The estimated ejection   fraction was 15%. Diffuse hypokinesis. No mural thrombus by   Definity contrast. Doppler parameters are consistent with   restrictive left ventricular relaxation (grade 3 diastolic   dysfunction). The E/e&' ratio is >20, suggesting markedly elevated   LV filling pressure. - Mitral valve: Mildly thickened leaflets . There was moderate   regurgitation. - Left atrium: The atrium was normal in size. - Right ventricle: The cavity size was moderately dilated. Mild   systolic dysfunction. - Right atrium: Severely dilated. - Tricuspid valve: There was moderate regurgitation. - Pulmonary arteries: PA peak pressure: 53 mm Hg (S). - Inferior vena cava: The vessel was dilated. The respirophasic   diameter changes were blunted (< 50%), consistent with elevated   central venous pressure.  Impressions:  - Compared to a prior study in 11/2017, the LVEF is unchanged at   15%. Left and right heart pressures are quite elevated.   Patient Profile     68 y.o. female with history of CAD with anterior STEMI with early stent thrombosis for missing doses of Brilinta and had PCI X 2 to LAD 2015, CKD stage III, HTN, HLD, ischemic cardiomyopathy/chronic combined CHF, noncompliance, obesity. She has been followed by the CHF clinic but no-showed for f/u echocardiogram and follow-up. At last HF visit 06/2018, 1) she could not afford Bidil due to cost, 2) refused re-challenge with Entresto, c) refused labs and d) refused to consider ICD or other advanced therapies.   She presented to [facility] with worsening SOB, cough, and fatigue x 2 weeks. 2D echo 11/28/18 showed EF 15%, grade 3 DD, moderate MR, mild RV dysfunction, moderate TR, elevated  CVP. She is being treated for CHF and also was treated for bronchitis.  Assessment & Plan    1. Acute on chronic combined CHF  - wt baseline wt  Was 181 lbs 07/11/2018, but dry wt may  be 192 lbs, was 222 on admission - 222>>205 lbs since admit - I/O net - 9.6 L since admit - continue diuresis, continue to follow I/O, wt, BMET - continue diuresis  - nutritionist to see, reinforce diet and fluid compliance  2. Ischemic cardiomyopathy -  - refused ICD previously - on BB, Hydralazine, increase Imdur 15 mg>>30 mg qd - refused Entresto 06/2018   3. CAD  - no ischemic sx, continue BB, statin, Brilinta - not on ASA at home  4. CKD stage III with marked proteinuria  - continue to follow, but Cr basically stable w/ diuresis - 24 hr urine in process  4. Abnormal LFTs  - may be 2nd hepatic congestion, trending down - follow in 2-3 days  For questions or updates, please contact [facility] Please consult [email or web address] for contact info under Cardiology/STEMI.  Signed, Theodore Demarkhonda Barrett, PA-C 12/01/2018, 11:27 AM    I have seen and examined the patient along with Theodore Demarkhonda Barrett, PA-C.  I have reviewed the chart, notes and new data.  I agree with PA/NP's note.  Key new complaints: Reports that her breathing is greatly improved.  She continues to have fairly tense calf edema. Key examination changes: Clear lungs, laterally displaced apical impulse, regular rate and rhythm, S3 gallop present, 2+ tense edema around the ankles and calves bilaterally, abdominal distention with hepatomegaly is present Key new findings / data: Stable creatinine around 1.5  PLAN: She would like to go home, but I think she is at high risk for readmission unless we fully diurese her.  I do not think she should be discharged until her weight is at least under 192 pounds and preferably down to about 180 pounds which I suspect is her true dry weight.  I think she should continue intravenous diuretics to get to those  targets.  I explained to her that we can pull fluid off quickly and safely while she is in the hospital, but cannot do this unless we are able to check labs frequently.  She understands and at least for now agrees to continue to stay in the hospital.  Thurmon FairMihai Neilan Rizzo, MD, [facility] [phone] 12/01/2018, 2:29 PM

## 2018-12-01 NOTE — Progress Notes (Signed)
PROGRESS NOTE    Deborah Ho  WUJ:[id number] DOB: 07/12/1951 DOA: 11/27/2018 PCP: Grayce SessionsEdwards, Michelle P, NP   Brief Narrative:68 y.o.femalewith a hx of HTN, HLD, tobacco use, CAD with hx Ant STEMI with early stent thrombosis for missing doses of Brilinta and had PCI X 2 to LAD 2015, chronic systolic HF with ICM at one point 10-15%-admitted on 11/27/2018 with shortness of breath with clinical exam and radiological findings consistent with Acute CHF exacerbation  Assessment & Plan:   Principal Problem:   Acute on chronic systolic CHF (congestive heart failure) (HCC) Active Problems:   Cardiomyopathy, ischemic   Essential hypertension   CKD (chronic kidney disease), stage III (HCC)   Acute exacerbation of CHF (congestive heart failure) (HCC)  1)HFrEF--- acute on chronic systolic dysfunction CHF exacerbation, repeat echo with EF in the 15% range largely unchanged from prior echo, most recent weight at the heart failure clinic on 07/12/2019 was 181 pounds, patient is at least 27 pounds and possibly up to 40 pounds above her dry weight at this time--- cardiology consult appreciated, continue isosorbide 15 mg daily along with hydralazine 25 mg 3 times daily , Lasix 80 mg IV twice daily, Aldactone 12.5 mg daily, avoid ACEI/ARB/ARNI due to renal concerns, echo from 11/28/2018  continues to show EF around 15% largely unchanged from prior echo,overall diuresing well, weight is not accurate, but a fluid balance is negative over one litre  Patient anxious to go home.cardiology would like to continue IV diuresis to get her to her dry weight.  Explained to the patient and she is willing to stay.  3)HTN--continue all cardiac medications as above.  4) stage III CKD----stable, baseline creatinine usually around 1.6, creatinine was 1.39 on admission reflecting possible volume overload/dilutional, with diuresis creatinine is now up to 1.57 , renally adjust medications, avoid nephrotoxic  agents/dehydration/hypotension, monitor renal function and electrolytes closely while diuresing patient  5)H/o CAD--- stable, troponin not elevated, no chest pains no ACS symptoms, continue Lipitor 40 mg daily, Coreg 6.25 mg, continue Brilinta, isosorbide 50 mg   Estimated body mass index is 33.59 kg/m as calculated from the following:   Height as of this encounter: 5' 5.5" (1.664 m).   Weight as of this encounter: 93 kg.  DVT prophylaxis: Lovenox Code Status: Full code Family Communication: None Disposition Plan: Still has room for IV diuresis  Consultants:  Cardiology Procedures: None Antimicrobials: None  Subjective: Sitting up by the side of the bed anxious to go home still continues to have lower extremity swelling and dry cough  Objective: Vitals:   11/30/18 1403 11/30/18 1944 12/01/18 0437 12/01/18 0440  BP: 117/73 128/70 (!) 141/77   Pulse: 79 91 87   Resp: 14 16 15    Temp: 97.8 F (36.6 C) 97.6 F (36.4 C) 98.4 F (36.9 C)   TempSrc: Oral Oral Oral   SpO2: 96% 95% 95%   Weight:    93 kg  Height:        Intake/Output Summary (Last 24 hours) at 12/01/2018 1356 Last data filed at 12/01/2018 1210 Gross per 24 hour  Intake 460 ml  Output 3550 ml  Net -3090 ml   Filed Weights   11/28/18 0404 11/29/18 0800 12/01/18 0440  Weight: 99.5 kg 97.1 kg 93 kg    Examination:  General exam: Appears calm and comfortable  Respiratory system: Crackles right base to auscultation. Respiratory effort normal. Cardiovascular system: S1 & S2 heard, RRR. No JVD, murmurs, rubs, gallops or clicks. No pedal edema. Gastrointestinal  system: Abdomen is nondistended, soft and nontender. No organomegaly or masses felt. Normal bowel sounds heard. Central nervous system: Alert and oriented. No focal neurological deficits. Extremities: 2+ pitting edema Skin: No rashes, lesions or ulcers Psychiatry: Judgement and insight appear normal. Mood & affect appropriate.     Data Reviewed: I  have personally reviewed following labs and imaging studies  CBC: Recent Labs  Lab 11/27/18 1607  WBC 8.0  NEUTROABS 4.9  HGB 14.1  HCT 46.2*  MCV 99.1  PLT 312   Basic Metabolic Panel: Recent Labs  Lab 11/27/18 1750 11/28/18 0534 11/29/18 0654 11/30/18 0535 12/01/18 0504  NA 146* 146* 146* 145 142  K 4.0 3.5 3.8 4.1 4.2  CL 112* 112* 107 104 103  CO2 24 22 27 28 28   GLUCOSE 101* 117* 106* 98 97  BUN 17 19 21  25* 29*  CREATININE 1.39* 1.47* 1.50* 1.48* 1.42*  CALCIUM 9.1 9.0 8.8* 9.0 9.1   GFR: Estimated Creatinine Clearance: 43.8 mL/min (A) (by C-G formula based on SCr of 1.42 mg/dL (H)). Liver Function Tests: Recent Labs  Lab 11/27/18 1750 12/01/18 0504  AST 26 29  ALT 68* 37  ALKPHOS 145* 137*  BILITOT 1.9* 1.6*  PROT 7.1 7.0  ALBUMIN 3.8 3.8   No results for input(s): LIPASE, AMYLASE in the last 168 hours. No results for input(s): AMMONIA in the last 168 hours. Coagulation Profile: No results for input(s): INR, PROTIME in the last 168 hours. Cardiac Enzymes: Recent Labs  Lab 11/27/18 1607 11/28/18 1014  TROPONINI <0.03 0.03*   BNP (last 3 results) No results for input(s): PROBNP in the last 8760 hours. HbA1C: No results for input(s): HGBA1C in the last 72 hours. CBG: No results for input(s): GLUCAP in the last 168 hours. Lipid Profile: No results for input(s): CHOL, HDL, LDLCALC, TRIG, CHOLHDL, LDLDIRECT in the last 72 hours. Thyroid Function Tests: No results for input(s): TSH, T4TOTAL, FREET4, T3FREE, THYROIDAB in the last 72 hours. Anemia Panel: No results for input(s): VITAMINB12, FOLATE, FERRITIN, TIBC, IRON, RETICCTPCT in the last 72 hours. Sepsis Labs: No results for input(s): PROCALCITON, LATICACIDVEN in the last 168 hours.  Recent Results (from the past 240 hour(s))  Urine culture     Status: None   Collection Time: 11/27/18  4:08 PM  Result Value Ref Range Status   Specimen Description   Final    URINE, CLEAN CATCH Performed at  [facility], [address] [place], [place] [address]    Special Requests   Final    NONE Performed at [facility], [address], [place], [place] [address]    Culture   Final    Multiple bacterial morphotypes present, none predominant. Suggest appropriate recollection if clinically indicated.   Report Status 11/29/2018 FINAL  Final         Radiology Studies: No results found.      Scheduled Meds: . atorvastatin  40 mg Oral q1800  . carvedilol  6.25 mg Oral BID WC  . enoxaparin (LOVENOX) injection  40 mg Subcutaneous Q24H  . ferrous sulfate  325 mg Oral 3 times per day on Mon Wed Fri  . furosemide  80 mg Intravenous BID  . hydrALAZINE  25 mg Oral TID  . isosorbide mononitrate  15 mg Oral Daily  . potassium chloride SA  40 mEq Oral BID  . sodium chloride flush  3 mL Intravenous Q12H  . spironolactone  25 mg Oral Daily  . ticagrelor  60 mg Oral BID   Continuous Infusions: . sodium chloride       LOS: 3 days     Alwyn Ren, MD [facility]  If 7PM-7AM, please contact night-coverage [email or web address] Password [email or web address] 12/01/2018, 1:56 PM

## 2018-12-01 NOTE — Care Management Important Message (Signed)
Important Message  Patient Details  Name: Deborah Ho MRN: [id number] Date of Birth: 13-Jun-1951   Medicare Important Message Given:  Yes    Caren Macadam 12/01/2018, 11:40 AMImportant Message  Patient Details  Name: Deborah Ho MRN: [id number] Date of Birth: Jul 09, 1951   Medicare Important Message Given:  Yes    Caren Macadam 12/01/2018, 11:40 AM

## 2018-12-01 NOTE — Progress Notes (Signed)
Nutrition Education Note  RD consulted for nutrition education regarding  CHF.  RD provided "Low Sodium Nutrition Therapy" handout from the [employer]. Reviewed patient's dietary recall. Provided examples on ways to decrease sodium intake in diet. Discouraged intake of processed foods and use of salt shaker. Encouraged fresh fruits and vegetables as well as whole grain sources of carbohydrates to maximize fiber intake.   RD discussed why it is important for patient to adhere to diet recommendations, and emphasized the role of fluids, foods to avoid, and importance of weighing self daily. Teach back method used.  Patient was seen by another RD on 11/19/17 for CHF education. Patient reports that for several months after that she was preparing meals ahead of time and being very mindful of salt and fluid intake. She states that she was doing well until 06/2018 when she was hospitalized and then for several weeks after hospitalization she began doing very well again until Christmas.   She reports that since Christmas she has felt unwell and that her concern about eating healthier options stopped. She is very knowledgeable about which foods to limit or avoid and about need to limit fluid intake. She is also aware what all counts as fluid (to include items such as ice cream and jello). Patient is very motivated to again make better food choices.   Expect good compliance.  Body mass index is 33.59 kg/m. Pt meets criteria for obesity based on current BMI.  Current diet order is 2 gram Na, patient is consuming approximately 100% of meals at this time. Labs and medications reviewed. No further nutrition interventions warranted at this time. RD contact information provided. If additional nutrition issues arise, please re-consult RD.      Trenton Gammon, MS, RD, LDN, [facility] Inpatient Clinical Dietitian Pager # [phone] After hours/weekend pager # [phone]

## 2018-12-01 NOTE — Progress Notes (Signed)
Resumed care of patient at 0930 and agree with prior RN assessment. Will continue to monitor.

## 2018-12-02 LAB — BASIC METABOLIC PANEL
Anion gap: 13 (ref 5–15)
BUN: 30 mg/dL — ABNORMAL HIGH (ref 8–23)
CO2: 30 mmol/L (ref 22–32)
Calcium: 9.3 mg/dL (ref 8.9–10.3)
Chloride: 98 mmol/L (ref 98–111)
Creatinine, Ser: 1.57 mg/dL — ABNORMAL HIGH (ref 0.44–1.00)
GFR calc Af Amer: 39 mL/min — ABNORMAL LOW (ref >60)
GFR calc non Af Amer: 34 mL/min — ABNORMAL LOW (ref >60)
Glucose, Bld: 141 mg/dL — ABNORMAL HIGH (ref 70–99)
Potassium: 4.2 mmol/L (ref 3.5–5.1)
Sodium: 141 mmol/L (ref 135–145)

## 2018-12-02 MED ORDER — ISOSORBIDE MONONITRATE ER 30 MG PO TB24
30.0000 mg | ORAL_TABLET | Freq: Every day | ORAL | Status: DC
  Administered 2018-12-02 – 2018-12-04 (×3): 30 mg via ORAL
  Filled 2018-12-02 (×3): qty 1

## 2018-12-02 MED ORDER — FUROSEMIDE 10 MG/ML IJ SOLN
80.0000 mg | Freq: Every day | INTRAMUSCULAR | Status: DC
  Administered 2018-12-03: 80 mg via INTRAVENOUS
  Filled 2018-12-02: qty 8

## 2018-12-02 NOTE — Progress Notes (Addendum)
Progress Note  Patient Name: Deborah Ho Date of Encounter: 12/02/2018  Primary Cardiologist: Peter Swaziland, MD/CHF clinic [name], MD  Subjective   Cough persists, better.  Inpatient Medications    Scheduled Meds: . atorvastatin  40 mg Oral q1800  . carvedilol  6.25 mg Oral BID WC  . enoxaparin (LOVENOX) injection  40 mg Subcutaneous Q24H  . ferrous sulfate  325 mg Oral 3 times per day on Mon Wed Fri  . furosemide  80 mg Intravenous BID  . hydrALAZINE  25 mg Oral TID  . isosorbide mononitrate  30 mg Oral Daily  . potassium chloride SA  40 mEq Oral BID  . sodium chloride flush  3 mL Intravenous Q12H  . spironolactone  25 mg Oral Daily  . ticagrelor  60 mg Oral BID   Continuous Infusions: . sodium chloride     PRN Meds: sodium chloride, acetaminophen, albuterol, benzonatate, fluticasone, ondansetron (ZOFRAN) IV, sodium chloride flush   Vital Signs    Vitals:   12/01/18 2026 12/02/18 0430 12/02/18 0434 12/02/18 0850  BP: 124/78 125/74  120/67  Pulse: 88 89  92  Resp:  16  18  Temp: 98.3 F (36.8 C) 98 F (36.7 C)    TempSrc: Oral Oral    SpO2: 96% 95%  97%  Weight:   91.3 kg   Height:        Intake/Output Summary (Last 24 hours) at 12/02/2018 0934 Last data filed at 12/02/2018 0500 Gross per 24 hour  Intake -  Output 1580 ml  Net -1580 ml   Last 3 Weights 12/02/2018 12/01/2018 11/29/2018  Weight (lbs) 201 lb 3.2 oz 205 lb 214 lb  Weight (kg) 91.264 kg 92.987 kg 97.07 kg     Telemetry    SR, no sig ectopy - Personally Reviewed  Physical Exam   General: Well developed, well nourished, female in no acute distress Head: Eyes PERRLA, No xanthomas.   Normocephalic and atraumatic Lungs: decreased BS bases. Heart: HRRR S1 S2, without MRG.  Pulses are 2+ & equal. No JVD. Abdomen: Bowel sounds are present, abdomen soft and non-tender without masses or  hernias noted. Msk: Normal strength and tone for age. Extremities: No clubbing, cyanosis, trace  edema.    Skin:  No rashes or lesions noted. Neuro: Alert and oriented X 3. Psych:  Good affect, responds appropriately   Labs    Chemistry Recent Labs  Lab 11/27/18 1750  11/30/18 0535 12/01/18 0504 12/02/18 0507  NA 146*   < > 145 142 141  K 4.0   < > 4.1 4.2 4.2  CL 112*   < > 104 103 98  CO2 24   < > 28 28 30   GLUCOSE 101*   < > 98 97 141*  BUN 17   < > 25* 29* 30*  CREATININE 1.39*   < > 1.48* 1.42* 1.57*  CALCIUM 9.1   < > 9.0 9.1 9.3  PROT 7.1  --   --  7.0  --   ALBUMIN 3.8  --   --  3.8  --   AST 26  --   --  29  --   ALT 68*  --   --  37  --   ALKPHOS 145*  --   --  137*  --   BILITOT 1.9*  --   --  1.6*  --   GFRNONAA 39*   < > 36* 38* 34*  GFRAA 45*   < >  42* 44* 39*  ANIONGAP 10   < > 13 11 13    < > = values in this interval not displayed.     Hematology Recent Labs  Lab 11/27/18 1607  WBC 8.0  RBC 4.66  HGB 14.1  HCT 46.2*  MCV 99.1  MCH 30.3  MCHC 30.5  RDW 14.9  PLT 312    Cardiac Enzymes Recent Labs  Lab 11/27/18 1607 11/28/18 1014  TROPONINI <0.03 0.03*   No results for input(s): TROPIPOC in the last 168 hours.   BNP Recent Labs  Lab 11/27/18 1607  BNP 1,435.0*     Radiology    No results found.  Cardiac Studies   2d echo 11/28/18 Study Conclusions  - Left ventricle: The cavity size was normal. Wall thickness was   increased in a pattern of mild LVH. The estimated ejection   fraction was 15%. Diffuse hypokinesis. No mural thrombus by   Definity contrast. Doppler parameters are consistent with   restrictive left ventricular relaxation (grade 3 diastolic   dysfunction). The E/e&' ratio is >20, suggesting markedly elevated   LV filling pressure. - Mitral valve: Mildly thickened leaflets . There was moderate   regurgitation. - Left atrium: The atrium was normal in size. - Right ventricle: The cavity size was moderately dilated. Mild   systolic dysfunction. - Right atrium: Severely dilated. - Tricuspid valve: There  was moderate regurgitation. - Pulmonary arteries: PA peak pressure: 53 mm Hg (S). - Inferior vena cava: The vessel was dilated. The respirophasic   diameter changes were blunted (< 50%), consistent with elevated   central venous pressure.  Impressions:  - Compared to a prior study in 11/2017, the LVEF is unchanged at   15%. Left and right heart pressures are quite elevated.   Patient Profile     68 y.o. female with history of CAD with anterior STEMI with early stent thrombosis for missing doses of Brilinta and had PCI X 2 to LAD 2015, CKD stage III, HTN, HLD, ischemic cardiomyopathy/chronic combined CHF, noncompliance, obesity. She has been followed by the CHF clinic but no-showed for f/u echocardiogram and follow-up. At last HF visit 06/2018, 1) she could not afford Bidil due to cost, 2) refused re-challenge with Entresto, c) refused labs and d) refused to consider ICD or other advanced therapies.   [facility] worsening SOB, cough, and fatigue x 2 weeks. 2D echo 11/28/18 showed EF 15%, grade 3 DD, moderate MR, mild RV dysfunction, moderate TR, elevated CVP. She is being treated for CHF and also was treated for bronchitis.  Assessment & Plan    1. Acute on chronic combined CHF  - wt 222>>201 today - I/O net -10 L since admit but may be incomplete - nutritionist has seen, pt benefited from this - continue diuresis, but close to euvolemic  2. Ischemic cardiomyopathy -  - no ICD planned, pt request - on BB, hydralazine, Imdur - has refused Entresto in the past - pt refused bedtime Hydralazine dose   3. CAD  - no ischemic sx   4. CKD stage III with marked proteinuria  - Eval per IM, getting 24 hr urine - Cr range 1.39-1.57 (today) since admit - BUN trending up as well, 17>>30 - follow  5. Abnormal LFTs  - most likely 2nd hepatic congestion - trending down - recheck per IM  For questions or updates, please contact [facility] Please consult [email or web address]  for contact info under Cardiology/STEMI.  Signed, Theodore Demarkhonda Barrett,  PA-C 12/02/2018, 9:34 AM     I have seen and examined the patient along with Theodore Demark, PA-C.  I have reviewed the chart, notes and new data.  I agree with PA/NP's note.  Key new complaints: continues to improve Key examination changes: 1-2+ leg edema Key new findings / data: BUN/creat a little higher  PLAN: Will change IV furosemide to once daily in view of renal parameters, but still at least 10 lb above euvolemic state (201 lb today, last hospital DC wt 192 lb, average weight last year 180 lb)  Thurmon Fair, MD, [facility] [phone] 12/02/2018, 10:20 AM

## 2018-12-02 NOTE — Progress Notes (Signed)
PROGRESS NOTE    Deborah Ho  IPJ:[id number] DOB: [birth year] DOA: 11/27/2018 PCP: Grayce Sessions, NP    Brief Narrative:68 y.o.femalewith a hx of HTN, HLD, tobacco use, CAD with hx Ant STEMI with early stent thrombosis for missing doses of Brilinta and had PCI X 2 to LAD 2015, chronic systolic HF with ICM at one point 10-15%-admitted on 11/27/2018 with shortness of breath with clinical exam and radiological findings consistent with Acute CHF exacerbation  Assessment & Plan:   Principal Problem:   Acute on chronic systolic CHF (congestive heart failure) (HCC) Active Problems:   Cardiomyopathy, ischemic   Essential hypertension   CKD (chronic kidney disease), stage III (HCC)   Acute exacerbation of CHF (congestive heart failure) (HCC)  1)HFrEF--- acute on chronic systolic dysfunction CHF exacerbation, repeat echo with EF in the 15%.  Her weight is down to 201 pounds today.  Cardiology has decreased her dose of Lasix in view of increasing renal functions.  However continue IV Lasix once a day.  Continue hydralazine and Aldactone.    3)HTN--continue all cardiac medications as above.  4) stage III CKD----stable, baseline creatinine usually around 1.6, creatinine was 1.39 on admission reflecting possible volume overload/dilutional, with diuresis creatinine is now up to 1.57 , renally adjust medications, avoid nephrotoxic agents/dehydration/hypotension, monitor renal function and electrolytes closely while diuresing patient  5)H/o CAD--- stable, troponin not elevated, no chest pains no ACS symptoms, continue Lipitor 40 mg daily, Coreg 6.25 mg, continue Brilinta, isosorbide 50 mg     Estimated body mass index is 32.97 kg/m as calculated from the following:   Height as of this encounter: 5' 5.5" (1.664 m).   Weight as of this encounter: 91.3 kg.  DVT prophylaxis: Lovenox Code Status: Full code Family Communication: None Disposition Plan: Patient still needing IV  diuresis still fluid overload above her dry weight needs inpatient hospital stay.  Consultants: Cardiology  Procedures: None Antimicrobials: None  Subjective: Sitting up by the side of the bed in no acute distress cough is better with Tessalon Perles breathing seems to be better  Objective: Vitals:   12/01/18 2026 12/02/18 0430 12/02/18 0434 12/02/18 0850  BP: 124/78 125/74  120/67  Pulse: 88 89  92  Resp:  16  18  Temp: 98.3 F (36.8 C) 98 F (36.7 C)    TempSrc: Oral Oral    SpO2: 96% 95%  97%  Weight:   91.3 kg   Height:        Intake/Output Summary (Last 24 hours) at 12/02/2018 1254 Last data filed at 12/02/2018 1204 Gross per 24 hour  Intake -  Output 1280 ml  Net -1280 ml   Filed Weights   11/29/18 0800 12/01/18 0440 12/02/18 0434  Weight: 97.1 kg 93 kg 91.3 kg    Examination:  General exam: Appears calm and comfortable  Respiratory system: Clear to auscultation. Respiratory effort normal. Cardiovascular system: S1 & S2 heard, RRR. No JVD, murmurs, rubs, gallops or clicks.  Crackles at the right base Gastrointestinal system: Abdomen is nondistended, soft and nontender. No organomegaly or masses felt. Normal bowel sounds heard. Central nervous system: Alert and oriented. No focal neurological deficits. Extremities: 1+ pitting edema bilaterally Skin: No rashes, lesions or ulcers Psychiatry: Judgement and insight appear normal. Mood & affect appropriate.     Data Reviewed: I have personally reviewed following labs and imaging studies  CBC: Recent Labs  Lab 11/27/18 1607  WBC 8.0  NEUTROABS 4.9  HGB 14.1  HCT 46.2*  MCV 99.1  PLT 312   Basic Metabolic Panel: Recent Labs  Lab 11/28/18 0534 11/29/18 0654 11/30/18 0535 12/01/18 0504 12/02/18 0507  NA 146* 146* 145 142 141  K 3.5 3.8 4.1 4.2 4.2  CL 112* 107 104 103 98  CO2 22 27 28 28 30   GLUCOSE 117* 106* 98 97 141*  BUN 19 21 25* 29* 30*  CREATININE 1.47* 1.50* 1.48* 1.42* 1.57*  CALCIUM  9.0 8.8* 9.0 9.1 9.3   GFR: Estimated Creatinine Clearance: 39.2 mL/min (A) (by C-G formula based on SCr of 1.57 mg/dL (H)). Liver Function Tests: Recent Labs  Lab 11/27/18 1750 12/01/18 0504  AST 26 29  ALT 68* 37  ALKPHOS 145* 137*  BILITOT 1.9* 1.6*  PROT 7.1 7.0  ALBUMIN 3.8 3.8   No results for input(s): LIPASE, AMYLASE in the last 168 hours. No results for input(s): AMMONIA in the last 168 hours. Coagulation Profile: No results for input(s): INR, PROTIME in the last 168 hours. Cardiac Enzymes: Recent Labs  Lab 11/27/18 1607 11/28/18 1014  TROPONINI <0.03 0.03*   BNP (last 3 results) No results for input(s): PROBNP in the last 8760 hours. HbA1C: No results for input(s): HGBA1C in the last 72 hours. CBG: No results for input(s): GLUCAP in the last 168 hours. Lipid Profile: No results for input(s): CHOL, HDL, LDLCALC, TRIG, CHOLHDL, LDLDIRECT in the last 72 hours. Thyroid Function Tests: No results for input(s): TSH, T4TOTAL, FREET4, T3FREE, THYROIDAB in the last 72 hours. Anemia Panel: No results for input(s): VITAMINB12, FOLATE, FERRITIN, TIBC, IRON, RETICCTPCT in the last 72 hours. Sepsis Labs: No results for input(s): PROCALCITON, LATICACIDVEN in the last 168 hours.  Recent Results (from the past 240 hour(s))  Urine culture     Status: None   Collection Time: 11/27/18  4:08 PM  Result Value Ref Range Status   Specimen Description   Final    URINE, CLEAN CATCH Performed at [facility], [address] [place], [place] [address]    Special Requests   Final    NONE Performed at [facility], [address], [place], [place] [address]    Culture   Final    Multiple bacterial morphotypes present, none predominant. Suggest appropriate recollection if clinically indicated.   Report Status 11/29/2018 FINAL  Final         Radiology Studies: No results found.      Scheduled Meds: . atorvastatin  40 mg Oral  q1800  . carvedilol  6.25 mg Oral BID WC  . enoxaparin (LOVENOX) injection  40 mg Subcutaneous Q24H  . ferrous sulfate  325 mg Oral 3 times per day on Mon Wed Fri  . [START ON 12/03/2018] furosemide  80 mg Intravenous Daily  . hydrALAZINE  25 mg Oral TID  . isosorbide mononitrate  30 mg Oral Daily  . potassium chloride SA  40 mEq Oral BID  . sodium chloride flush  3 mL Intravenous Q12H  . spironolactone  25 mg Oral Daily  . ticagrelor  60 mg Oral BID   Continuous Infusions: . sodium chloride       LOS: 4 days     Alwyn Ren, MD [facility]  If 7PM-7AM, please contact night-coverage [email or web address] Password [facility] 12/02/2018, 12:54 PM

## 2018-12-03 DIAGNOSIS — I5043 Acute on chronic combined systolic (congestive) and diastolic (congestive) heart failure: Secondary | ICD-10-CM

## 2018-12-03 LAB — BASIC METABOLIC PANEL
Anion gap: 11 (ref 5–15)
BUN: 32 mg/dL — ABNORMAL HIGH (ref 8–23)
CO2: 28 mmol/L (ref 22–32)
Calcium: 9.2 mg/dL (ref 8.9–10.3)
Chloride: 101 mmol/L (ref 98–111)
Creatinine, Ser: 1.55 mg/dL — ABNORMAL HIGH (ref 0.44–1.00)
GFR calc Af Amer: 40 mL/min — ABNORMAL LOW (ref >60)
GFR calc non Af Amer: 34 mL/min — ABNORMAL LOW (ref >60)
Glucose, Bld: 96 mg/dL (ref 70–99)
Potassium: 4.7 mmol/L (ref 3.5–5.1)
Sodium: 140 mmol/L (ref 135–145)

## 2018-12-03 NOTE — Progress Notes (Signed)
Progress Note  Patient Name: Deborah Ho Date of Encounter: 12/03/2018  Primary Cardiologist: Peter Swaziland, MD / [facility]   Subjective   68 year old female with a history of coronary artery disease.  She is status post large anterior wall myocardial infarction with subsequent chronic systolic congestive heart failure.  She was admitted with acute on chronic systolic congestive heart failure.    We have diuresed a total of 10.8 L so far during this admission.  Wt today is 89.5 kg.  Creatinine remains fairly stable at 1.55.  Echocardiogram on January 13 reveals severe left ventricular dysfunction with an ejection fraction of 15%.  She has grade 3 diastolic dysfunction.  There is markedly elevated filling pressures. He has moderate pulmonary artery hypertension with an estimated PA pressure of 53 mmHg.  She feels very well this morning.  She can tell that she is gradually improving.  Her leg swelling has resolved.  Inpatient Medications    Scheduled Meds: . atorvastatin  40 mg Oral q1800  . carvedilol  6.25 mg Oral BID WC  . enoxaparin (LOVENOX) injection  40 mg Subcutaneous Q24H  . ferrous sulfate  325 mg Oral 3 times per day on Mon Wed Fri  . furosemide  80 mg Intravenous Daily  . hydrALAZINE  25 mg Oral TID  . isosorbide mononitrate  30 mg Oral Daily  . potassium chloride SA  40 mEq Oral BID  . sodium chloride flush  3 mL Intravenous Q12H  . spironolactone  25 mg Oral Daily  . ticagrelor  60 mg Oral BID   Continuous Infusions: . sodium chloride     PRN Meds: sodium chloride, acetaminophen, albuterol, benzonatate, fluticasone, ondansetron (ZOFRAN) IV, sodium chloride flush   Vital Signs    Vitals:   12/02/18 1404 12/02/18 1727 12/02/18 2057 12/03/18 0526  BP: 123/72 124/83 110/68 127/83  Pulse: 85 89 79 88  Resp: 18  15 14   Temp: 98.1 F (36.7 C)  (!) 97.4 F (36.3 C) (!) 96.2 F (35.7 C)  TempSrc: Oral  Axillary Axillary  SpO2: 95%  96% 97%  Weight:    89.5  kg  Height:        Intake/Output Summary (Last 24 hours) at 12/03/2018 0842 Last data filed at 12/02/2018 1736 Gross per 24 hour  Intake 1080 ml  Output 1600 ml  Net -520 ml   Last 3 Weights 12/03/2018 12/02/2018 12/01/2018  Weight (lbs) 197 lb 5 oz 201 lb 3.2 oz 205 lb  Weight (kg) 89.5 kg 91.264 kg 92.987 kg      Telemetry    NSR  - Personally Reviewed  ECG     NSR  - Personally Reviewed  Physical Exam   GEN:  Middle-aged black female, no acute distress.  She is very pleasant. Neck: No JVD Cardiac: RRR soft  systolic murmur Respiratory:  Clear to auscultation GI: Soft, nontender, non-distended  MS: No edema; No deformity. Neuro:  Nonfocal  Psych: Normal affect   Labs    Chemistry Recent Labs  Lab 11/27/18 1750  12/01/18 0504 12/02/18 0507 12/03/18 0515  NA 146*   < > 142 141 140  K 4.0   < > 4.2 4.2 4.7  CL 112*   < > 103 98 101  CO2 24   < > 28 30 28   GLUCOSE 101*   < > 97 141* 96  BUN 17   < > 29* 30* 32*  CREATININE 1.39*   < > 1.42* 1.57* 1.55*  CALCIUM 9.1   < > 9.1 9.3 9.2  PROT 7.1  --  7.0  --   --   ALBUMIN 3.8  --  3.8  --   --   AST 26  --  29  --   --   ALT 68*  --  37  --   --   ALKPHOS 145*  --  137*  --   --   BILITOT 1.9*  --  1.6*  --   --   GFRNONAA 39*   < > 38* 34* 34*  GFRAA 45*   < > 44* 39* 40*  ANIONGAP 10   < > 11 13 11    < > = values in this interval not displayed.     Hematology Recent Labs  Lab 11/27/18 1607  WBC 8.0  RBC 4.66  HGB 14.1  HCT 46.2*  MCV 99.1  MCH 30.3  MCHC 30.5  RDW 14.9  PLT 312    Cardiac Enzymes Recent Labs  Lab 11/27/18 1607 11/28/18 1014  TROPONINI <0.03 0.03*   No results for input(s): TROPIPOC in the last 168 hours.   BNP Recent Labs  Lab 11/27/18 1607  BNP 1,435.0*     DDimer No results for input(s): DDIMER in the last 168 hours.   Radiology    No results found.  Cardiac Studies     Patient Profile     68 y.o. female who was admitted with acute on chronic bind  congestive heart failure.  Assessment & Plan    1.  Acute on chronic combined systolic and diastolic congestive heart failure: Joshlynn is making good progress.  She is diuresed 10.8 L so far.  I suspect that she is close to being euvolemic.  Her renal function remained stable.  2.  Ischemic cardiomyopathy: Continue current medications.  She is refused Entresto in the past.  Further follow-up per the advanced heart failure team.  3.  Coronary artery disease: She denies having any angina.        For questions or updates, please contact [facility] Please consult [email or web address] for contact info under        Signed, Kristeen Miss, MD  12/03/2018, 8:42 AM

## 2018-12-03 NOTE — Progress Notes (Signed)
PROGRESS NOTE    Deborah Ho  HOZ:[id number] DOB: 09-14-1951 DOA: 11/27/2018 PCP: Grayce Sessions, NP    Brief Narrative: 68 y.o.femalewith a hx of HTN, HLD, tobacco use, CAD with hx Ant STEMI with early stent thrombosis for missing doses of Brilinta and had PCI X 2 to LAD 2015, chronic systolic HF with ICM at one point 10-15%-admitted on 11/27/2018 with shortness of breath with clinical exam and radiological findings consistent with Acute CHF exacerbation   Assessment & Plan:   Principal Problem:   Acute on chronic systolic CHF (congestive heart failure) (HCC) Active Problems:   Cardiomyopathy, ischemic   Essential hypertension   CKD (chronic kidney disease), stage III (HCC)   Acute exacerbation of CHF (congestive heart failure) (HCC)  1)HFrEF--- acute on chronic systolic dysfunction CHF exacerbation, repeat echo with EF in the 15%.  Her weight is down to 89.5 kg today.  Cardiology has decreased her dose of Lasix in view of increasing renal functions.  However continue IV Lasix once a day.  Continue hydralazine and Aldactone.    3)HTN--continue all cardiac medications as above.  4) stage III CKD----stable, baseline creatinine usually around 1.6, creatinine was 1.39 on admission reflecting possible volume overload/dilutional, with diuresis creatinine is now up to 1.57, renally adjust medications, avoid nephrotoxic agents/dehydration/hypotension, monitor renal function and electrolytes closely while diuresing patient  5)H/o CAD--- stable, troponin not elevated, no chest pains no ACS symptoms, continue Lipitor 40 mg daily, Coreg 6.25 mg, continue Brilinta, isosorbide 50 mg   Estimated body mass index is 32.34 kg/m as calculated from the following:   Height as of this encounter: 5' 5.5" (1.664 m).   Weight as of this encounter: 89.5 kg.  DVT prophylaxis: lovenox Code Status: full Family Communication: none Disposition Plan: pending cardiology  clearance Consultants: cardiology  Procedures: none Antimicrobials:  none Subjective: Feels better still some lower extremity edema though better  Objective: Vitals:   12/02/18 1404 12/02/18 1727 12/02/18 2057 12/03/18 0526  BP: 123/72 124/83 110/68 127/83  Pulse: 85 89 79 88  Resp: 18  15 14   Temp: 98.1 F (36.7 C)  (!) 97.4 F (36.3 C) (!) 96.2 F (35.7 C)  TempSrc: Oral  Axillary Axillary  SpO2: 95%  96% 97%  Weight:    89.5 kg  Height:        Intake/Output Summary (Last 24 hours) at 12/03/2018 1414 Last data filed at 12/03/2018 1300 Gross per 24 hour  Intake 720 ml  Output 1800 ml  Net -1080 ml   Filed Weights   12/01/18 0440 12/02/18 0434 12/03/18 0526  Weight: 93 kg 91.3 kg 89.5 kg    Examination:  General exam: Appears calm and comfortable  Respiratory system: crackles right base to auscultation. Respiratory effort normal. Cardiovascular system: S1 & S2 heard, RRR. No JVD, murmurs, rubs, gallops or clicks. No pedal edema. Gastrointestinal system: Abdomen is nondistended, soft and nontender. No organomegaly or masses felt. Normal bowel sounds heard. Central nervous system: Alert and oriented. No focal neurological deficits. Extremities: 1 plus edema Skin: No rashes, lesions or ulcers Psychiatry: Judgement and insight appear normal. Mood & affect appropriate.     Data Reviewed: I have personally reviewed following labs and imaging studies  CBC: Recent Labs  Lab 11/27/18 1607  WBC 8.0  NEUTROABS 4.9  HGB 14.1  HCT 46.2*  MCV 99.1  PLT 312   Basic Metabolic Panel: Recent Labs  Lab 11/29/18 0654 11/30/18 0535 12/01/18 0504 12/02/18 0507 12/03/18 0515  NA  146* 145 142 141 140  K 3.8 4.1 4.2 4.2 4.7  CL 107 104 103 98 101  CO2 27 28 28 30 28   GLUCOSE 106* 98 97 141* 96  BUN 21 25* 29* 30* 32*  CREATININE 1.50* 1.48* 1.42* 1.57* 1.55*  CALCIUM 8.8* 9.0 9.1 9.3 9.2   GFR: Estimated Creatinine Clearance: 39.3 mL/min (A) (by C-G formula based  on SCr of 1.55 mg/dL (H)). Liver Function Tests: Recent Labs  Lab 11/27/18 1750 12/01/18 0504  AST 26 29  ALT 68* 37  ALKPHOS 145* 137*  BILITOT 1.9* 1.6*  PROT 7.1 7.0  ALBUMIN 3.8 3.8   No results for input(s): LIPASE, AMYLASE in the last 168 hours. No results for input(s): AMMONIA in the last 168 hours. Coagulation Profile: No results for input(s): INR, PROTIME in the last 168 hours. Cardiac Enzymes: Recent Labs  Lab 11/27/18 1607 11/28/18 1014  TROPONINI <0.03 0.03*   BNP (last 3 results) No results for input(s): PROBNP in the last 8760 hours. HbA1C: No results for input(s): HGBA1C in the last 72 hours. CBG: No results for input(s): GLUCAP in the last 168 hours. Lipid Profile: No results for input(s): CHOL, HDL, LDLCALC, TRIG, CHOLHDL, LDLDIRECT in the last 72 hours. Thyroid Function Tests: No results for input(s): TSH, T4TOTAL, FREET4, T3FREE, THYROIDAB in the last 72 hours. Anemia Panel: No results for input(s): VITAMINB12, FOLATE, FERRITIN, TIBC, IRON, RETICCTPCT in the last 72 hours. Sepsis Labs: No results for input(s): PROCALCITON, LATICACIDVEN in the last 168 hours.  Recent Results (from the past 240 hour(s))  Urine culture     Status: None   Collection Time: 11/27/18  4:08 PM  Result Value Ref Range Status   Specimen Description   Final    URINE, CLEAN CATCH Performed at [facility], [address], [place], [place] [address]    Special Requests   Final    NONE Performed at [facility], [address], [place], [place] [address]    Culture   Final    Multiple bacterial morphotypes present, none predominant. Suggest appropriate recollection if clinically indicated.   Report Status 11/29/2018 FINAL  Final         Radiology Studies: No results found.      Scheduled Meds: . atorvastatin  40 mg Oral q1800  . carvedilol  6.25 mg Oral BID WC  . enoxaparin (LOVENOX) injection  40 mg Subcutaneous Q24H  .  ferrous sulfate  325 mg Oral 3 times per day on Mon Wed Fri  . furosemide  80 mg Intravenous Daily  . hydrALAZINE  25 mg Oral TID  . isosorbide mononitrate  30 mg Oral Daily  . potassium chloride SA  40 mEq Oral BID  . sodium chloride flush  3 mL Intravenous Q12H  . spironolactone  25 mg Oral Daily  . ticagrelor  60 mg Oral BID   Continuous Infusions: . sodium chloride       LOS: 5 days     Alwyn Ren, MD [facility]   If 7PM-7AM, please contact night-coverage [email or web address] Password [facility] 12/03/2018, 2:14 PM

## 2018-12-04 LAB — BASIC METABOLIC PANEL
Anion gap: 12 (ref 5–15)
BUN: 31 mg/dL — ABNORMAL HIGH (ref 8–23)
CO2: 24 mmol/L (ref 22–32)
Calcium: 9.6 mg/dL (ref 8.9–10.3)
Chloride: 104 mmol/L (ref 98–111)
Creatinine, Ser: 1.61 mg/dL — ABNORMAL HIGH (ref 0.44–1.00)
GFR calc Af Amer: 38 mL/min — ABNORMAL LOW (ref >60)
GFR calc non Af Amer: 33 mL/min — ABNORMAL LOW (ref >60)
Glucose, Bld: 94 mg/dL (ref 70–99)
Potassium: 5 mmol/L (ref 3.5–5.1)
Sodium: 140 mmol/L (ref 135–145)

## 2018-12-04 MED ORDER — POTASSIUM CHLORIDE CRYS ER 20 MEQ PO TBCR: 20.0000 meq | EXTENDED_RELEASE_TABLET | Freq: Every day | ORAL | 0 refills | Status: DC

## 2018-12-04 MED ORDER — FUROSEMIDE 40 MG PO TABS
80.0000 mg | ORAL_TABLET | Freq: Every day | ORAL | Status: DC
  Administered 2018-12-04: 80 mg via ORAL
  Filled 2018-12-04: qty 2

## 2018-12-04 MED ORDER — POTASSIUM CHLORIDE CRYS ER 20 MEQ PO TBCR
20.0000 meq | EXTENDED_RELEASE_TABLET | Freq: Every day | ORAL | Status: DC
  Administered 2018-12-04: 20 meq via ORAL
  Filled 2018-12-04: qty 1

## 2018-12-04 MED ORDER — ISOSORBIDE MONONITRATE ER 30 MG PO TB24: 30.0000 mg | ORAL_TABLET | Freq: Every day | ORAL | 1 refills | Status: DC

## 2018-12-04 MED ORDER — FUROSEMIDE 80 MG PO TABS: 80.0000 mg | ORAL_TABLET | Freq: Every day | ORAL | 1 refills | Status: DC

## 2018-12-04 NOTE — Progress Notes (Signed)
Provided patient with one taxi voucher for discharge home. Does not have anyone who can provide transportation for her and needs safe way home.  Will leave voucher with RN, Selena Batten, who can call Rolan Bucco [phone]) when patient is ready to leave.   Enid Cutter, MSW, LCSWA Clinical Social Work [phone]

## 2018-12-04 NOTE — Discharge Summary (Signed)
Physician Discharge Summary  Deborah Ho BWG:[id number] DOB: 07/13/1951 DOA: 11/27/2018  PCP: Grayce Sessions, NP  Admit date: 11/27/2018 Discharge date: 12/04/2018  Admitted From: Home Disposition: Home Recommendations for Outpatient Follow-up:  1. Follow up with PCP in 1-2 weeks 2. Please obtain BMP/CBC in one week 3. Please follow up heart failure clinic in 1 week Home Health none Equipment/Devices none Discharge Condition: Stable and improved CODE STATUS full code Diet recommendation: Cardiac Brief/Interim Summary: 68 y.o.femalewith a hx of HTN, HLD, tobacco use, CAD with hx Ant STEMI with early stent thrombosis for missing doses of Brilinta and had PCI X 2 to LAD 2015, chronic systolic HF with ICM at one point 10-15%-admitted on 11/27/2018 with shortness of breath with clinical exam and radiological findings consistent with Acute CHF exacerbation   Discharge Diagnoses:  Principal Problem:   Acute on chronic systolic CHF (congestive heart failure) (HCC) Active Problems:   Cardiomyopathy, ischemic   Essential hypertension   CKD (chronic kidney disease), stage III (HCC)   Acute exacerbation of CHF (congestive heart failure) (HCC)  1)HFrEF--- acute on chronic systolic dysfunction CHF exacerbation, repeat echo with EF in the 15%. Her weight is down to 89.4 kg  on the day of discharge.  Cardiology wants to discharge patient on Lasix 80 mg once a day as patient thinks that works better for her at home than twice a day.  Continue Aldactone continue potassium supplement she is asked to follow-up with heart failure clinic in 1 week.   2)HTN--continue all cardiac medications including Imdur and Lasix Aldactone and hydralazine.  Not on an ACE inhibitor due to CKD.Marland Kitchen  4) stage III CKD----stable creatinine stable at 1.6 her baseline creatinine is around 1.6.  5)H/o CAD--- stable, troponin not elevated, no chest pains no ACS symptoms, continue Lipitor 40 mg daily, Coreg 6.25 mg,  continue Brilinta, isosorbide 50 mg    Estimated body mass index is 32.28 kg/m as calculated from the following:   Height as of this encounter: 5' 5.5" (1.664 m).   Weight as of this encounter: 89.4 kg.  Discharge Instructions  Discharge Instructions    Avoid straining   Complete by:  As directed    Call MD for:  difficulty breathing, headache or visual disturbances   Complete by:  As directed    Call MD for:  persistant nausea and vomiting   Complete by:  As directed    Diet - low sodium heart healthy   Complete by:  As directed    Heart Failure patients record your daily weight using the same scale at the same time of day   Complete by:  As directed    Increase activity slowly   Complete by:  As directed    STOP any activity that causes chest pain, shortness of breath, dizziness, sweating, or exessive weakness   Complete by:  As directed      Allergies as of 12/04/2018      Reactions   Aspirin Swelling   Chewable children's aspirin makes patients tongue and face swell   Effient [prasugrel] Swelling   Patient's tongue and face swells   Entresto [sacubitril-valsartan] Other (See Comments)   dizziness   Lactose Intolerance (gi) Other (See Comments)   REACTION: stomach upset   Robitussin Dm [guaifenesin-dm] Swelling   Patient's tongue swells   Sulfa Antibiotics Swelling   Other    Had to replace "catgut" with clamps      Medication List    STOP taking these medications  hydrALAZINE 25 MG tablet Commonly known as:  APRESOLINE     TAKE these medications   atorvastatin 40 MG tablet Commonly known as:  LIPITOR Take 1 tablet (40 mg total) by mouth daily at 6 PM.   benzonatate 100 MG capsule Commonly known as:  TESSALON Take 1 capsule (100 mg total) by mouth 3 (three) times daily as needed for cough.   carvedilol 6.25 MG tablet Commonly known as:  COREG Take 6.25 mg by mouth daily.   cetirizine 10 MG tablet Commonly known as:  ZYRTEC Take 10 mg daily by  mouth.   cholecalciferol 1000 units tablet Commonly known as:  VITAMIN D Take 1,000 Units by mouth See admin instructions. Twice a month   DAYQUIL PO Take 30 mLs by mouth daily as needed (cold / flu-like sxs).   ferrous sulfate 325 (65 FE) MG tablet Take 325 mg by mouth See admin instructions. Three times daily on Monday,Wednesday, friday   fluticasone 50 MCG/ACT nasal spray Commonly known as:  FLONASE Place 1 spray into both nostrils daily as needed for allergies (congestion).   furosemide 80 MG tablet Commonly known as:  LASIX Take 1 tablet (80 mg total) by mouth daily. What changed:  when to take this   isosorbide mononitrate 30 MG 24 hr tablet Commonly known as:  IMDUR Take 1 tablet (30 mg total) by mouth daily. What changed:  how much to take   magnesium oxide 400 (241.3 Mg) MG tablet Commonly known as:  MAG-OX Take 400 mg by mouth See admin instructions. Twice a month   metolazone 2.5 MG tablet Commonly known as:  ZAROXOLYN 4 times weekly   nitroGLYCERIN 0.4 MG SL tablet Commonly known as:  NITROSTAT Place 0.4 mg under the tongue every 5 (five) minutes as needed for chest pain.   potassium chloride SA 20 MEQ tablet Commonly known as:  K-DUR,KLOR-CON Take 1 tablet (20 mEq total) by mouth daily. What changed:    how much to take  when to take this   spironolactone 25 MG tablet Commonly known as:  ALDACTONE Take 1 tablet (25 mg total) by mouth daily. What changed:  how much to take   ticagrelor 60 MG Tabs tablet Commonly known as:  BRILINTA Take 1 tablet (60 mg total) by mouth 2 (two) times daily.   traMADol 50 MG tablet Commonly known as:  ULTRAM Take 1 tablet (50 mg total) by mouth every 12 (twelve) hours as needed. What changed:    how much to take  reasons to take this      Follow-up Information    Grayce Sessions, NP Follow up.   Specialty:  Internal Medicine Contact information: [address] [place]  [address] [phone]        Sw[place], Peter M, MD .   Specialty:  Cardiology Contact information: [address] [place] [address] [place] [address] [phone]        Laurey Morale, MD Follow up.   Specialty:  Cardiology Why:  Follow-up in 1 week check BMP Contact information: [address] [place] [address] [phone]          Allergies  Allergen Reactions  . Aspirin Swelling    Chewable children's aspirin makes patients tongue and face swell  . Effient [Prasugrel] Swelling    Patient's tongue and face swells  . Entresto [Sacubitril-Valsartan] Other (See Comments)    dizziness  . Lactose Intolerance (Gi) Other (See Comments)    REACTION: stomach upset  .  Robitussin Dm [Guaifenesin-Dm] Swelling    Patient's tongue swells  . Sulfa Antibiotics Swelling  . Other     Had to replace "catgut" with clamps    Consultations: cardiology  Procedures/Studies: Dg Chest 2 View  Result Date: 11/27/2018 CLINICAL DATA:  Productive cough with fatigue for 2 weeks. History of congestive heart failure and hypertension. EXAM: CHEST - 2 VIEW COMPARISON:  03/10/2018 and 11/18/2017 radiographs. FINDINGS: Stable cardiomegaly, chronic vascular congestion and blunting of the right costophrenic angle. No airspace disease, enlarging pleural effusion or pneumothorax. The bones appear unchanged. IMPRESSION: Stable chronic findings including cardiomegaly and vascular congestion. No evidence of pneumonia. Electronically Signed   By: Carey Bullocks M.D.   On: 11/27/2018 15:09    (Echo, Carotid, EGD, Colonoscopy, ERCP)    Subjective:  Patient sitting up by the side of the bed feeling better denies any new complaints denies chest pain edema has decreased Discharge Exam: Vitals:   12/04/18 0731 12/04/18 0939  BP: 128/78 133/72  Pulse: 84 83  Resp:    Temp:  (!) 97.5 F (36.4 C)  SpO2: 98% 98%   Vitals:   12/03/18 2053 12/04/18 0445 12/04/18 0731 12/04/18 0939  BP: 129/76  130/82 128/78 133/72  Pulse: 84 89 84 83  Resp: 16 16    Temp: 98.2 F (36.8 C) 98.6 F (37 C)  (!) 97.5 F (36.4 C)  TempSrc: Oral Oral  Oral  SpO2: 98% 93% 98% 98%  Weight:  89.4 kg    Height:        General: Pt is alert, awake, not in acute distress Cardiovascular: RRR, S1/S2 +, no rubs, no gallops Respiratory: CTA bilaterally, no wheezing, no rhonchi Abdominal: Soft, NT, ND, bowel sounds + Extremities: no edema, no cyanosis    The results of significant diagnostics from this hospitalization (including imaging, microbiology, ancillary and laboratory) are listed below for reference.     Microbiology: Recent Results (from the past 240 hour(s))  Urine culture     Status: None   Collection Time: 11/27/18  4:08 PM  Result Value Ref Range Status   Specimen Description   Final    URINE, CLEAN CATCH Performed at [facility], [address], [place], [place] [address]    Special Requests   Final    NONE Performed at [facility], [address], [place], [place] [address]    Culture   Final    Multiple bacterial morphotypes present, none predominant. Suggest appropriate recollection if clinically indicated.   Report Status 11/29/2018 FINAL  Final     Labs: BNP (last 3 results) Recent Labs    03/10/18 1605 11/27/18 1607  BNP 1,439.5* 1,435.0*   Basic Metabolic Panel: Recent Labs  Lab 11/30/18 0535 12/01/18 0504 12/02/18 0507 12/03/18 0515 12/04/18 0534  NA 145 142 141 140 140  K 4.1 4.2 4.2 4.7 5.0  CL 104 103 98 101 104  CO2 28 28 30 28 24   GLUCOSE 98 97 141* 96 94  BUN 25* 29* 30* 32* 31*  CREATININE 1.48* 1.42* 1.57* 1.55* 1.61*  CALCIUM 9.0 9.1 9.3 9.2 9.6   Liver Function Tests: Recent Labs  Lab 11/27/18 1750 12/01/18 0504  AST 26 29  ALT 68* 37  ALKPHOS 145* 137*  BILITOT 1.9* 1.6*  PROT 7.1 7.0  ALBUMIN 3.8 3.8   No results for input(s): LIPASE, AMYLASE in the last 168 hours. No results for  input(s): AMMONIA in the last 168 hours. CBC: Recent  Labs  Lab 11/27/18 1607  WBC 8.0  NEUTROABS 4.9  HGB 14.1  HCT 46.2*  MCV 99.1  PLT 312   Cardiac Enzymes: Recent Labs  Lab 11/27/18 1607 11/28/18 1014  TROPONINI <0.03 0.03*   BNP: Invalid input(s): POCBNP CBG: No results for input(s): GLUCAP in the last 168 hours. D-Dimer No results for input(s): DDIMER in the last 72 hours. Hgb A1c No results for input(s): HGBA1C in the last 72 hours. Lipid Profile No results for input(s): CHOL, HDL, LDLCALC, TRIG, CHOLHDL, LDLDIRECT in the last 72 hours. Thyroid function studies No results for input(s): TSH, T4TOTAL, T3FREE, THYROIDAB in the last 72 hours.  Invalid input(s): FREET3 Anemia work up No results for input(s): VITAMINB12, FOLATE, FERRITIN, TIBC, IRON, RETICCTPCT in the last 72 hours. Urinalysis    Component Value Date/Time   COLORURINE AMBER (A) 11/27/2018 1608   APPEARANCEUR HAZY (A) 11/27/2018 1608   LABSPEC 1.026 11/27/2018 1608   PHURINE 5.0 11/27/2018 1608   GLUCOSEU NEGATIVE 11/27/2018 1608   HGBUR SMALL (A) 11/27/2018 1608   BILIRUBINUR NEGATIVE 11/27/2018 1608   KETONESUR NEGATIVE 11/27/2018 1608   PROTEINUR >=300 (A) 11/27/2018 1608   UROBILINOGEN 1.0 07/25/2015 1601   NITRITE NEGATIVE 11/27/2018 1608   LEUKOCYTESUR TRACE (A) 11/27/2018 1608   Sepsis Labs Invalid input(s): PROCALCITONIN,  WBC,  LACTICIDVEN Microbiology Recent Results (from the past 240 hour(s))  Urine culture     Status: None   Collection Time: 11/27/18  4:08 PM  Result Value Ref Range Status   Specimen Description   Final    URINE, CLEAN CATCH Performed at [facility], [address] [place], [place] [address]    Special Requests   Final    NONE Performed at [facility], [address] [place], [place] [address]    Culture   Final    Multiple bacterial morphotypes present, none predominant. Suggest appropriate recollection if  clinically indicated.   Report Status 11/29/2018 FINAL  Final     Time coordinating discharge: 33 minutes  SIGNED:   Alwyn RenElizabeth G Eriyana Sweeten, MD  [facility] 12/04/2018, 9:52 AM Pager   If 7PM-7AM, please contact night-coverage [email or web address] Password [email or web address]

## 2018-12-04 NOTE — Progress Notes (Signed)
Progress Note  Patient Name: Deborah Ho Date of Encounter: 12/04/2018  Primary Cardiologist: Peter Swaziland, MD / Shirlee Latch   Subjective   68 year old female with a history of coronary artery disease.  She is status post large anterior wall myocardial infarction with subsequent chronic systolic congestive heart failure.  She was admitted with acute on chronic systolic congestive heart failure.   Echocardiogram on January 13 reveals severe left ventricular dysfunction with an ejection fraction of 15%.  She has grade 3 diastolic dysfunction.  There is markedly elevated filling pressures. He has moderate pulmonary artery hypertension with an estimated PA pressure of 53 mmHg.  She feels very well this morning.  She can tell that she is gradually improving.  Her leg swelling has resolved.  She has diuresed 11.8 L so far during this hospitalization.  She is feeling quite a bit better.   Inpatient Medications    Scheduled Meds: . atorvastatin  40 mg Oral q1800  . carvedilol  6.25 mg Oral BID WC  . enoxaparin (LOVENOX) injection  40 mg Subcutaneous Q24H  . ferrous sulfate  325 mg Oral 3 times per day on Mon Wed Fri  . furosemide  80 mg Intravenous Daily  . hydrALAZINE  25 mg Oral TID  . isosorbide mononitrate  30 mg Oral Daily  . potassium chloride SA  40 mEq Oral BID  . sodium chloride flush  3 mL Intravenous Q12H  . spironolactone  25 mg Oral Daily  . ticagrelor  60 mg Oral BID   Continuous Infusions: . sodium chloride     PRN Meds: sodium chloride, acetaminophen, albuterol, benzonatate, fluticasone, ondansetron (ZOFRAN) IV, sodium chloride flush   Vital Signs    Vitals:   12/03/18 1712 12/03/18 2053 12/04/18 0445 12/04/18 0731  BP: (!) 141/74 129/76 130/82 128/78  Pulse: 80 84 89 84  Resp:  16 16   Temp:  98.2 F (36.8 C) 98.6 F (37 C)   TempSrc:  Oral Oral   SpO2: 99% 98% 93% 98%  Weight:   89.4 kg   Height:        Intake/Output Summary (Last 24 hours) at  12/04/2018 0814 Last data filed at 12/03/2018 1500 Gross per 24 hour  Intake 480 ml  Output 1500 ml  Net -1020 ml   Last 3 Weights 12/04/2018 12/03/2018 12/02/2018  Weight (lbs) 197 lb 197 lb 5 oz 201 lb 3.2 oz  Weight (kg) 89.359 kg 89.5 kg 91.264 kg      Telemetry    NSR  - Personally Reviewed  ECG     Physical Exam   Physical Exam: Blood pressure 128/78, pulse 84, temperature 98.6 F (37 C), temperature source Oral, resp. rate 16, height 5' 5.5" (1.664 m), weight 89.4 kg, SpO2 98 %.  GEN:  Middle age female,  Pleasant, NAD  HEENT: Normal NECK: No JVD; No carotid bruits LYMPHATICS: No lymphadenopathy CARDIAC: RRR  RESPIRATORY:  Clear to auscultation without rales, wheezing or rhonchi  ABDOMEN: Soft, non-tender, non-distended MUSCULOSKELETAL:  No edema; No deformity  SKIN: Warm and dry NEUROLOGIC:  Alert and oriented x 3   Labs    Chemistry Recent Labs  Lab 11/27/18 1750  12/01/18 0504 12/02/18 0507 12/03/18 0515 12/04/18 0534  NA 146*   < > 142 141 140 140  K 4.0   < > 4.2 4.2 4.7 5.0  CL 112*   < > 103 98 101 104  CO2 24   < > 28 30 28  24  GLUCOSE 101*   < > 97 141* 96 94  BUN 17   < > 29* 30* 32* 31*  CREATININE 1.39*   < > 1.42* 1.57* 1.55* 1.61*  CALCIUM 9.1   < > 9.1 9.3 9.2 9.6  PROT 7.1  --  7.0  --   --   --   ALBUMIN 3.8  --  3.8  --   --   --   AST 26  --  29  --   --   --   ALT 68*  --  37  --   --   --   ALKPHOS 145*  --  137*  --   --   --   BILITOT 1.9*  --  1.6*  --   --   --   GFRNONAA 39*   < > 38* 34* 34* 33*  GFRAA 45*   < > 44* 39* 40* 38*  ANIONGAP 10   < > 11 13 11 12    < > = values in this interval not displayed.     Hematology Recent Labs  Lab 11/27/18 1607  WBC 8.0  RBC 4.66  HGB 14.1  HCT 46.2*  MCV 99.1  MCH 30.3  MCHC 30.5  RDW 14.9  PLT 312    Cardiac Enzymes Recent Labs  Lab 11/27/18 1607 11/28/18 1014  TROPONINI <0.03 0.03*   No results for input(s): TROPIPOC in the last 168 hours.   BNP Recent Labs   Lab 11/27/18 1607  BNP 1,435.0*     DDimer No results for input(s): DDIMER in the last 168 hours.   Radiology    No results found.  Cardiac Studies     Patient Profile     68 y.o. female who was admitted with acute on chronic bind congestive heart failure.  Assessment & Plan    1.  Acute on chronic combined systolic and diastolic congestive heart failure: She is feeling quite a bit better.  We will change her Lasix to 80 mg once a day.  She thinks that the 80 mg once a day seem to work better than the twice daily scheduling.  We have increased her spironolactone to 25 mg a day.  We will reduce her potassium chloride to 20 mEq a day.  She will need to follow-up in the advanced heart failure clinic in 1 week for an office visit and basic metabolic profile.  2.  Ischemic cardiomyopathy: Continue current medications.  She will follow-up with the advanced heart failure team.  3.  Coronary artery disease: Doing well.  She is not had any further episodes of angina    For questions or updates, please contact [facility] Please consult [email or web address] for contact info under        Signed, Kristeen MissPhilip Farron Lafond, MD  12/04/2018, 8:14 AM

## 2018-12-15 ENCOUNTER — Inpatient Hospital Stay (HOSPITAL_COMMUNITY)
Admission: EM | Admit: 2018-12-15 | Discharge: 2018-12-18 | DRG: 291 | Disposition: A | Discharge status: Home Health | Payer: Medicare Other | Attending: Internal Medicine | Admitting: Internal Medicine

## 2018-12-15 ENCOUNTER — Emergency Department (HOSPITAL_COMMUNITY): Payer: Medicare Other

## 2018-12-15 ENCOUNTER — Encounter (HOSPITAL_COMMUNITY): Payer: Self-pay | Admitting: *Deleted

## 2018-12-15 ENCOUNTER — Other Ambulatory Visit: Payer: Self-pay

## 2018-12-15 DIAGNOSIS — F17210 Nicotine dependence, cigarettes, uncomplicated: Secondary | ICD-10-CM | POA: Diagnosis present

## 2018-12-15 DIAGNOSIS — Z7951 Long term (current) use of inhaled steroids: Secondary | ICD-10-CM | POA: Diagnosis not present

## 2018-12-15 DIAGNOSIS — Z955 Presence of coronary angioplasty implant and graft: Secondary | ICD-10-CM

## 2018-12-15 DIAGNOSIS — Z79899 Other long term (current) drug therapy: Secondary | ICD-10-CM | POA: Diagnosis not present

## 2018-12-15 DIAGNOSIS — R188 Other ascites: Secondary | ICD-10-CM | POA: Diagnosis present

## 2018-12-15 DIAGNOSIS — I130 Hypertensive heart and chronic kidney disease with heart failure and stage 1 through stage 4 chronic kidney disease, or unspecified chronic kidney disease: Secondary | ICD-10-CM | POA: Diagnosis present

## 2018-12-15 DIAGNOSIS — K219 Gastro-esophageal reflux disease without esophagitis: Secondary | ICD-10-CM | POA: Diagnosis present

## 2018-12-15 DIAGNOSIS — I10 Essential (primary) hypertension: Secondary | ICD-10-CM | POA: Diagnosis present

## 2018-12-15 DIAGNOSIS — Z8249 Family history of ischemic heart disease and other diseases of the circulatory system: Secondary | ICD-10-CM

## 2018-12-15 DIAGNOSIS — E785 Hyperlipidemia, unspecified: Secondary | ICD-10-CM | POA: Diagnosis present

## 2018-12-15 DIAGNOSIS — I5043 Acute on chronic combined systolic (congestive) and diastolic (congestive) heart failure: Secondary | ICD-10-CM | POA: Diagnosis present

## 2018-12-15 DIAGNOSIS — N183 Chronic kidney disease, stage 3 unspecified: Secondary | ICD-10-CM | POA: Diagnosis present

## 2018-12-15 DIAGNOSIS — R1013 Epigastric pain: Secondary | ICD-10-CM | POA: Diagnosis not present

## 2018-12-15 DIAGNOSIS — I252 Old myocardial infarction: Secondary | ICD-10-CM

## 2018-12-15 DIAGNOSIS — R109 Unspecified abdominal pain: Secondary | ICD-10-CM | POA: Diagnosis present

## 2018-12-15 DIAGNOSIS — R05 Cough: Secondary | ICD-10-CM | POA: Diagnosis present

## 2018-12-15 DIAGNOSIS — E739 Lactose intolerance, unspecified: Secondary | ICD-10-CM | POA: Diagnosis present

## 2018-12-15 DIAGNOSIS — I5023 Acute on chronic systolic (congestive) heart failure: Secondary | ICD-10-CM | POA: Diagnosis present

## 2018-12-15 DIAGNOSIS — K7460 Unspecified cirrhosis of liver: Secondary | ICD-10-CM | POA: Diagnosis present

## 2018-12-15 DIAGNOSIS — I2510 Atherosclerotic heart disease of native coronary artery without angina pectoris: Secondary | ICD-10-CM | POA: Diagnosis present

## 2018-12-15 DIAGNOSIS — I255 Ischemic cardiomyopathy: Secondary | ICD-10-CM | POA: Diagnosis present

## 2018-12-15 DIAGNOSIS — I509 Heart failure, unspecified: Secondary | ICD-10-CM

## 2018-12-15 LAB — CBC
HCT: 46.6 % — ABNORMAL HIGH (ref 36.0–46.0)
Hemoglobin: 14.2 g/dL (ref 12.0–15.0)
MCH: 29.8 pg (ref 26.0–34.0)
MCHC: 30.5 g/dL (ref 30.0–36.0)
MCV: 97.7 fL (ref 80.0–100.0)
Platelets: 232 10*3/uL (ref 150–400)
RBC: 4.77 MIL/uL (ref 3.87–5.11)
RDW: 13.6 % (ref 11.5–15.5)
WBC: 8.5 10*3/uL (ref 4.0–10.5)
nRBC: 0 % (ref 0.0–0.2)

## 2018-12-15 LAB — CBG MONITORING, ED: Glucose-Capillary: 125 mg/dL — ABNORMAL HIGH (ref 70–99)

## 2018-12-15 LAB — COMPREHENSIVE METABOLIC PANEL
ALT: 24 U/L (ref 0–44)
AST: 25 U/L (ref 15–41)
Albumin: 3.9 g/dL (ref 3.5–5.0)
Alkaline Phosphatase: 174 U/L — ABNORMAL HIGH (ref 38–126)
Anion gap: 12 (ref 5–15)
BUN: 22 mg/dL (ref 8–23)
CO2: 18 mmol/L — ABNORMAL LOW (ref 22–32)
Calcium: 9.4 mg/dL (ref 8.9–10.3)
Chloride: 112 mmol/L — ABNORMAL HIGH (ref 98–111)
Creatinine, Ser: 1.27 mg/dL — ABNORMAL HIGH (ref 0.44–1.00)
GFR calc Af Amer: 51 mL/min — ABNORMAL LOW (ref >60)
GFR calc non Af Amer: 44 mL/min — ABNORMAL LOW (ref >60)
Glucose, Bld: 106 mg/dL — ABNORMAL HIGH (ref 70–99)
Potassium: 3.4 mmol/L — ABNORMAL LOW (ref 3.5–5.1)
Sodium: 142 mmol/L (ref 135–145)
Total Bilirubin: 1.5 mg/dL — ABNORMAL HIGH (ref 0.3–1.2)
Total Protein: 7.2 g/dL (ref 6.5–8.1)

## 2018-12-15 LAB — I-STAT TROPONIN, ED: Troponin i, poc: 0 ng/mL (ref 0.00–0.08)

## 2018-12-15 LAB — LIPASE, BLOOD: Lipase: 29 U/L (ref 11–51)

## 2018-12-15 LAB — BRAIN NATRIURETIC PEPTIDE: B Natriuretic Peptide: 1850.9 pg/mL — ABNORMAL HIGH (ref 0.0–100.0)

## 2018-12-15 MED ORDER — POTASSIUM CHLORIDE CRYS ER 20 MEQ PO TBCR
40.0000 meq | EXTENDED_RELEASE_TABLET | Freq: Once | ORAL | Status: AC
  Administered 2018-12-15: 40 meq via ORAL
  Filled 2018-12-15: qty 2

## 2018-12-15 MED ORDER — FENTANYL CITRATE (PF) 100 MCG/2ML IJ SOLN
100.0000 ug | Freq: Once | INTRAMUSCULAR | Status: AC
  Administered 2018-12-15: 100 ug via INTRAVENOUS
  Filled 2018-12-15: qty 2

## 2018-12-15 MED ORDER — FUROSEMIDE 10 MG/ML IJ SOLN
80.0000 mg | Freq: Once | INTRAMUSCULAR | Status: AC
  Administered 2018-12-15: 80 mg via INTRAVENOUS
  Filled 2018-12-15: qty 8

## 2018-12-15 MED ORDER — ONDANSETRON HCL 4 MG/2ML IJ SOLN
4.0000 mg | Freq: Once | INTRAMUSCULAR | Status: AC
  Administered 2018-12-15: 4 mg via INTRAVENOUS
  Filled 2018-12-15: qty 2

## 2018-12-15 MED ORDER — SODIUM CHLORIDE 0.9% FLUSH: 3.0000 mL | Freq: Once | INTRAVENOUS | Status: DC

## 2018-12-15 MED ORDER — SODIUM CHLORIDE (PF) 0.9 % IJ SOLN
INTRAMUSCULAR | Status: AC
  Filled 2018-12-15: qty 50

## 2018-12-15 MED ORDER — ONDANSETRON 4 MG PO TBDP
4.0000 mg | ORAL_TABLET | Freq: Once | ORAL | Status: AC | PRN
  Administered 2018-12-15: 4 mg via ORAL
  Filled 2018-12-15: qty 1

## 2018-12-15 MED ORDER — IOPAMIDOL (ISOVUE-300) INJECTION 61%
INTRAVENOUS | Status: AC
  Administered 2018-12-15: 100 mL via INTRAVENOUS
  Filled 2018-12-15: qty 100

## 2018-12-15 MED ORDER — IOPAMIDOL (ISOVUE-300) INJECTION 61%
100.0000 mL | Freq: Once | INTRAVENOUS | Status: AC | PRN
  Administered 2018-12-15: 100 mL via INTRAVENOUS

## 2018-12-15 NOTE — ED Notes (Signed)
Patient transported to CT 

## 2018-12-15 NOTE — ED Notes (Signed)
Patient transported to MRI 

## 2018-12-15 NOTE — ED Provider Notes (Signed)
[facility]-EMERGENCY DEPT Provider Note   CSN: [id number] Arrival date & time: 12/15/18  1926     History   Chief Complaint Chief Complaint  Patient presents with  . Cough  . Otalgia    right  . Dizziness  . Emesis    HPI Deborah Ho is a 68 y.o. female.  HPI  68 year old female presents with multiple complaints.  Chief complaint is abdominal pain and vomiting.  Starting about 6 days ago she noticed some abdominal pain and threw up.  Vomiting has not recurred but she has continued to have abdominal pain this mostly lower.  At times will radiate from her abdomen into her chest and into her ear.  Has had on and off ear pain and when she has the ear pain on the right she also has a headache.  She also notes nasal drainage.  She previously had a cough that is gone but now is replaced by the nasal drainage.  She denies chest pain or shortness of breath.  She states she has had a feeling like she is drunk when she first wakes up.  This is been ongoing for about 5 days.  She will be off balance for about an hour when first awakening and then this will seem to go away.  Past Medical History:  Diagnosis Date  . CAD (coronary artery disease) 05/03/14; 05/09/14   a. anterior STEMI with early in-stent thorombosis for missed dose of Brillinta s/p PCI with DESx 2 into LAD (04/2014)  . CHF (congestive heart failure) (HCC)   . GERD (gastroesophageal reflux disease)    Probable  . HTN (hypertension)   . Hyperlipidemia   . Ischemic cardiomyopathy    a. 04/2014 ECHO with EF 45-50% b.  Repeat 2D echo 08/14/14 with EF down at 15%. Life vest placed  . Non compliance w medication regimen   . Tobacco use     Patient Active Problem List   Diagnosis Date Noted  . Acute CHF (congestive heart failure) (HCC) 12/15/2018  . Acute exacerbation of CHF (congestive heart failure) (HCC) 11/28/2018  . Acute on chronic systolic CHF (congestive heart failure) (HCC) 11/27/2018  .  Noncompliance with medication regimen 04/02/2017  . Intractable nausea and vomiting 02/18/2017  . Hyperlipidemia   . GERD (gastroesophageal reflux disease)   . CHF (congestive heart failure) (HCC)   . Cellulitis of left lower extremity   . Cough productive of clear sputum   . Cellulitis of left lower leg   . Hypokalemia 01/28/2017  . Peripheral edema   . Abdominal distension 06/26/2016  . Anasarca 06/26/2016  . Chronic systolic heart failure (HCC) 02/20/2016  . CKD (chronic kidney disease), stage III (HCC)   . Adjustment disorder with mixed anxiety and depressed mood 02/28/2015  . Pleural effusion on right   . Essential hypertension   . Angioedema of lips 08/17/2014  . Ischemic cardiomyopathy 08/13/2014  . Coronary atherosclerosis of native coronary artery 05/10/2014  . History of ST elevation myocardial infarction (STEMI) 05/09/2014  . HLD (hyperlipidemia) 05/06/2014  . Tobacco abuse 05/06/2014  . Cardiomyopathy, ischemic 05/06/2014  . Elevated LFTs 05/06/2014    Past Surgical History:  Procedure Laterality Date  . CHOLECYSTECTOMY    . CORONARY ANGIOPLASTY WITH STENT PLACEMENT  05/03/14   STEMI- stent to LAD DES- Xience alpine  . CORONARY ANGIOPLASTY WITH STENT PLACEMENT  05/09/14   STEMI- overlapping stent to LAD, pt had missed a dose of Brilinta  . LEFT HEART CATHETERIZATION  WITH CORONARY ANGIOGRAM N/A 05/03/2014   Procedure: LEFT HEART CATHETERIZATION WITH CORONARY ANGIOGRAM;  Surgeon: Peter M Swaziland, MD;  Location: [facility] CATH LAB;  Service: Cardiovascular;  Laterality: N/A;  . LEFT HEART CATHETERIZATION WITH CORONARY ANGIOGRAM N/A 05/09/2014   Procedure: LEFT HEART CATHETERIZATION WITH CORONARY ANGIOGRAM;  Surgeon: Corky Crafts, MD;  Location: [facility] CATH LAB;  Service: Cardiovascular;  Laterality: N/A;  . PARTIAL HYSTERECTOMY    . PERCUTANEOUS STENT INTERVENTION  05/03/2014   Procedure: PERCUTANEOUS STENT INTERVENTION;  Surgeon: Peter M Swaziland, MD;  Location: [facility] CATH LAB;   Service: Cardiovascular;;  DES Prox LAD      OB History   No obstetric history on file.      Home Medications    Prior to Admission medications   Medication Sig Start Date End Date Taking? Authorizing Provider  atorvastatin (LIPITOR) 40 MG tablet Take 1 tablet (40 mg total) by mouth daily at 6 PM. 03/18/18  Yes Sharol Harness, Brittainy M, PA-C  benzonatate (TESSALON) 100 MG capsule Take 1 capsule (100 mg total) by mouth 3 (three) times daily as needed for cough. 12/01/18  Yes Alwyn Ren, MD  cetirizine (ZYRTEC) 10 MG tablet Take 10 mg daily by mouth.    Yes [provider]  ferrous sulfate 325 (65 FE) MG tablet Take 325 mg by mouth 3 (three) times a week. Monday,Wednesday, friday   Yes [provider]  furosemide (LASIX) 80 MG tablet Take 1 tablet (80 mg total) by mouth daily. 12/04/18  Yes Alwyn Ren, MD  metolazone (ZAROXOLYN) 2.5 MG tablet 4 times weekly Patient taking differently: Take 2.5 mg by mouth daily as needed (fluid).  07/11/18  Yes Graciella Freer, PA-C  potassium chloride SA (K-DUR,KLOR-CON) 20 MEQ tablet Take 1 tablet (20 mEq total) by mouth daily. 12/04/18  Yes Alwyn Ren, MD  spironolactone (ALDACTONE) 25 MG tablet Take 1 tablet (25 mg total) by mouth daily. 12/02/18  Yes Alwyn Ren, MD  carvedilol (COREG) 6.25 MG tablet Take 6.25 mg by mouth daily.    [provider]  cholecalciferol (VITAMIN D) 1000 units tablet Take 1,000 Units by mouth See admin instructions. Twice a month    [provider]  fluticasone (FLONASE) 50 MCG/ACT nasal spray Place 1 spray into both nostrils daily as needed for allergies (congestion).  04/04/18   [provider]  isosorbide mononitrate (IMDUR) 30 MG 24 hr tablet Take 1 tablet (30 mg total) by mouth daily. 12/04/18   Alwyn Ren, MD  magnesium oxide (MAG-OX) 400 (241.3 Mg) MG tablet Take 400 mg by mouth See admin instructions. Twice a month    [provider]  nitroGLYCERIN (NITROSTAT) 0.4 MG SL tablet Place 0.4 mg under the tongue every 5 (five) minutes as needed for chest pain.    [provider]  Pseudoephedrine-APAP-DM (DAYQUIL PO) Take 30 mLs by mouth daily as needed (cold / flu-like sxs).    [provider]  ticagrelor (BRILINTA) 60 MG TABS tablet Take 1 tablet (60 mg total) by mouth 2 (two) times daily. 04/02/17   Dhungel, Nishant, MD  traMADol (ULTRAM) 50 MG tablet Take 1 tablet (50 mg total) by mouth every 12 (twelve) hours as needed. 12/01/18 12/01/19  Alwyn Ren, MD    Family History Family History  Problem Relation Age of Onset  . Diabetes Mother   . Hypertension Mother     Social History Social History   Tobacco Use  . Smoking status:  Current Some Day Smoker    Packs/day: 0.10    Years: 0.50    Pack years: 0.05    Types: Cigarettes  . Smokeless tobacco: Current User  . Tobacco comment: down to 3 cigarettes daily (08/03/14)  Substance Use Topics  . Alcohol use: No  . Drug use: No     Allergies   Aspirin; Effient [prasugrel]; Entresto [sacubitril-valsartan]; Lactose intolerance (gi); Robitussin dm [guaifenesin-dm]; Sulfa antibiotics; and Other   Review of Systems Review of Systems  Constitutional: Negative for fever.  HENT: Positive for congestion, ear pain and rhinorrhea.   Respiratory: Negative for cough and shortness of breath.   Cardiovascular: Negative for chest pain.  Gastrointestinal: Positive for abdominal pain, nausea and vomiting. Negative for diarrhea.  Neurological: Positive for dizziness and headaches. Negative for weakness and numbness.  All other systems reviewed and are negative.    Physical Exam Updated Vital Signs BP (!) 145/110   Pulse (!) 101   Temp 97.6 F (36.4 C) (Axillary)   Resp 20   Ht 5' 5.5" (1.664 m)   Wt 88 kg   SpO2 98%   BMI 31.79 kg/m   Physical Exam Vitals signs and nursing note reviewed.  Constitutional:      Appearance: She  is well-developed. She is obese.  HENT:     Head: Normocephalic and atraumatic.     Right Ear: External ear normal.     Left Ear: External ear normal.     Ears:     Comments: No obvious otitis media or fluid collection. No otitis externa    Nose: Nose normal.  Eyes:     General:        Right eye: No discharge.        Left eye: No discharge.     Extraocular Movements: Extraocular movements intact.     Pupils: Pupils are equal, round, and reactive to light.     Comments: Perhaps slight nystagmus to right  Cardiovascular:     Rate and Rhythm: Regular rhythm. Tachycardia present.     Heart sounds: Normal heart sounds.  Pulmonary:     Effort: Pulmonary effort is normal. Tachypnea present. No accessory muscle usage.     Breath sounds: Examination of the right-lower field reveals decreased breath sounds. Examination of the left-lower field reveals decreased breath sounds. Decreased breath sounds present.  Abdominal:     Palpations: Abdomen is soft.     Tenderness: There is abdominal tenderness.     Comments: Generalized, worst in lower abdomen  Skin:    General: Skin is warm and dry.  Neurological:     Mental Status: She is alert.     Comments: CN 3-12 grossly intact. 5/5 strength in all 4 extremities. Grossly normal sensation. Normal finger to nose.   Psychiatric:        Mood and Affect: Mood is not anxious.      ED Treatments / Results  Labs (all labs ordered are listed, but only abnormal results are displayed) Labs Reviewed  COMPREHENSIVE METABOLIC PANEL - Abnormal; Notable for the following components:      Result Value   Potassium 3.4 (*)    Chloride 112 (*)    CO2 18 (*)    Glucose, Bld 106 (*)    Creatinine, Ser 1.27 (*)    Alkaline Phosphatase 174 (*)    Total Bilirubin 1.5 (*)    GFR calc non Af Amer 44 (*)    GFR calc Af Amer 51 (*)  All other components within normal limits  CBC - Abnormal; Notable for the following components:   HCT 46.6 (*)    All other  components within normal limits  BRAIN NATRIURETIC PEPTIDE - Abnormal; Notable for the following components:   B Natriuretic Peptide 1,850.9 (*)    All other components within normal limits  CBG MONITORING, ED - Abnormal; Notable for the following components:   Glucose-Capillary 125 (*)    All other components within normal limits  LIPASE, BLOOD  URINALYSIS, ROUTINE W REFLEX MICROSCOPIC  I-STAT TROPONIN, ED    EKG EKG Interpretation  Date/Time:  Thursday December 15 2018 19:39:08 EST Ventricular Rate:  106 PR Interval:    QRS Duration: 91 QT Interval:  355 QTC Calculation: 472 R Axis:   -77 Text Interpretation:  Sinus tachycardia Inferior infarct, old Anterior infarct, old Lateral leads are also involved Baseline wander in lead(s) V2 V6 no significant change since Nov 28 2018 Confirmed by Pricilla LovelessGoldston, Cellie Dardis [phone]([id number]) on 12/15/2018 9:31:16 PM   Radiology Dg Chest 2 View  Result Date: 12/15/2018 CLINICAL DATA:  Dyspnea and productive cough for several days. EXAM: CHEST - 2 VIEW COMPARISON:  11/27/2018 FINDINGS: There has been interval clearing of right base consolidation and atelectasis. Stable cardiomegaly with minimal aortic atherosclerosis is redemonstrated. No new pulmonary consolidation nor overt pulmonary edema. No pleural effusion or pneumothorax. No acute osseous abnormality. IMPRESSION: No active cardiopulmonary disease. Stable cardiomegaly and aortic atherosclerosis. Electronically Signed   By: Tollie Ethavid  Kwon M.D.   On: 12/15/2018 22:57   Mr Brain Wo Contrast  Result Date: 12/15/2018 CLINICAL DATA:  Altered mental status EXAM: MRI HEAD WITHOUT CONTRAST TECHNIQUE: Multiplanar, multiecho pulse sequences of the brain and surrounding structures were obtained without intravenous contrast. COMPARISON:  None. FINDINGS: BRAIN: Susceptibility effects from dental hardware partially obscure the frontal lobes on diffusion-weighted imaging. There is no acute infarct, acute hemorrhage, hydrocephalus  or extra-axial collection. The midline structures are normal. No midline shift or other mass effect. Early confluent hyperintense T2-weighted signal of the periventricular and deep white matter, most commonly due to chronic ischemic microangiopathy. The cerebral and cerebellar volume are age-appropriate. Susceptibility-sensitive sequences show no chronic microhemorrhage or superficial siderosis. VASCULAR: Major intracranial arterial and venous sinus flow voids are normal. SKULL AND UPPER CERVICAL SPINE: Calvarial bone marrow signal is normal. There is no skull base mass. Visualized upper cervical spine and soft tissues are normal. SINUSES/ORBITS: No fluid levels or advanced mucosal thickening. No mastoid or middle ear effusion. The orbits are normal. IMPRESSION: Chronic small vessel ischemia without acute intracranial abnormality. Electronically Signed   By: Deatra RobinsonKevin  Herman M.D.   On: 12/15/2018 22:29   Ct Abdomen Pelvis W Contrast  Result Date: 12/15/2018 CLINICAL DATA:  Abdominal pain, nausea and vomiting EXAM: CT ABDOMEN AND PELVIS WITH CONTRAST TECHNIQUE: Multidetector CT imaging of the abdomen and pelvis was performed using the standard protocol following bolus administration of intravenous contrast. CONTRAST:  100mL ISOVUE-300 IOPAMIDOL (ISOVUE-300) INJECTION 61% COMPARISON:  Abdominal ultrasound for ascites 11/19/2017 FINDINGS: Lower chest: Cardiomegaly with small right effusion and peribronchial thickening to both lower lobes, right greater than left. Subsegmental atelectasis is noted the right middle lobe and both lower lobes. Hepatobiliary: Slight morphologic changes of cirrhosis. No space-occupying mass. Cholecystectomy. Pancreas: Trace edema in the right upper quadrant adjacent to the duodenal sweep. Mild peripancreatic edema is also suggested near the pancreatic head. Peptic ulcer disease, duodenitis or mild pancreatitis might contribute to this appearance. Sympathetic fluid from cirrhosis is also  possibility.  Spleen: Normal Adrenals/Urinary Tract: Normal bilateral adrenal glands. Symmetric cortical enhancement both kidneys. Simple cyst arising off the upper pole the right kidney is identified measuring 4.2 cm in diameter. A cyst arising off the lower pole of the left kidney is also noted measuring 1.9 cm in diameter without worrisome features. No solid enhancing renal mass. No nephrolithiasis nor hydroureteronephrosis. The urinary bladder is unremarkable for the degree of distention. Stomach/Bowel: Normal appendix. Small hiatal hernia. Physiologic distention of the stomach with normal duodenal sweep and ligament of Treitz position. There does appear to be some trace fluid and edema emanating from the right upper quadrant off the region of the duodenum and the possibility of peptic ulcer disease accounting for this appearance or mild pancreatitis are some considerations. The colon is decompressed in appearance without acute inflammatory change or obstruction. Vascular/Lymphatic: No significant vascular findings are present. No enlarged abdominal or pelvic lymph nodes. Reproductive: Status post hysterectomy. No adnexal masses. Other: Small amount of free fluid in the pelvis. Soft tissue anasarca. Trace perihepatic ascites. Musculoskeletal: No acute nor suspicious osseous abnormality. T11-12 disc space narrowing. IMPRESSION: 1. Cardiomegaly with presumed inflammatory peribronchial thickening and small right effusion. 2. Morphologic changes of cirrhosis with small volume ascites. 3. There is edema and trace fluid in the right upper quadrant in the region of the duodenal sweep and pancreatic head. Differential considerations might include a lot nidus, pancreatitis or sympathetic fluid from cirrhosis. 4. Bilateral renal cysts without obstructive uropathy. 5. No evidence of diverticulitis or large bowel obstruction. Electronically Signed   By: Tollie Ethavid  Kwon M.D.   On: 12/15/2018 23:18    Procedures Procedures  (including critical care time)  Medications Ordered in ED Medications  ondansetron (ZOFRAN-ODT) disintegrating tablet 4 mg (4 mg Oral Given 12/15/18 1957)  potassium chloride SA (K-DUR,KLOR-CON) CR tablet 40 mEq (40 mEq Oral Given 12/15/18 2244)  fentaNYL (SUBLIMAZE) injection 100 mcg (100 mcg Intravenous Given 12/15/18 2240)  ondansetron (ZOFRAN) injection 4 mg (4 mg Intravenous Given 12/15/18 2240)  iopamidol (ISOVUE-300) 61 % injection 100 mL (100 mLs Intravenous Contrast Given 12/15/18 2254)  furosemide (LASIX) injection 80 mg (80 mg Intravenous Given 12/15/18 2358)     Initial Impression / Assessment and Plan / ED Course  I have reviewed the triage vital signs and the nursing notes.  Pertinent labs & imaging results that were available during my care of the patient were reviewed by me and considered in my medical decision making (see chart for details).     For the patient's unsteadiness, MRI was obtained given multiple risk factors for stroke.  MRI is negative.  No obvious acute pathology in your ears.  As far as her abdominal pain, CT shows nonspecific fluid/ascites.  Could be from cirrhosis as seen on liver imaging, versus some other inflammatory process.  With a normal lipase I think pancreatitis is probably less likely.  This could be fluid overload given the elevated BNP and the fluid seen on CT in the lung windows.  Given she is tachypneic I think she will need diuresis and monitoring.  Dr. Toniann FailKakrakandy to admit.  Final Clinical Impressions(s) / ED Diagnoses   Final diagnoses:  Acute on chronic systolic congestive heart failure (HCC)  Other ascites    ED Discharge Orders    None       Pricilla LovelessGoldston, Demarqus Jocson, MD 12/16/18 [phone]

## 2018-12-15 NOTE — ED Triage Notes (Signed)
Pt stated "Seen here x 1 week ago.  They got interested in the fluid on my stomach and didn't do anything for the rest of the stuff.  I have a cough that comes and goes, and my right ear hurts off and on.  I also feel drunk, not dizzy.  Do you know the difference between drunk & dizzy?"

## 2018-12-16 ENCOUNTER — Encounter (HOSPITAL_COMMUNITY): Payer: Self-pay | Admitting: Internal Medicine

## 2018-12-16 DIAGNOSIS — I10 Essential (primary) hypertension: Secondary | ICD-10-CM

## 2018-12-16 DIAGNOSIS — I5023 Acute on chronic systolic (congestive) heart failure: Secondary | ICD-10-CM

## 2018-12-16 DIAGNOSIS — R109 Unspecified abdominal pain: Secondary | ICD-10-CM | POA: Diagnosis present

## 2018-12-16 DIAGNOSIS — R1013 Epigastric pain: Secondary | ICD-10-CM

## 2018-12-16 LAB — BASIC METABOLIC PANEL
Anion gap: 8 (ref 5–15)
BUN: 25 mg/dL — ABNORMAL HIGH (ref 8–23)
CO2: 22 mmol/L (ref 22–32)
Calcium: 9.3 mg/dL (ref 8.9–10.3)
Chloride: 111 mmol/L (ref 98–111)
Creatinine, Ser: 1.4 mg/dL — ABNORMAL HIGH (ref 0.44–1.00)
GFR calc Af Amer: 45 mL/min — ABNORMAL LOW (ref >60)
GFR calc non Af Amer: 39 mL/min — ABNORMAL LOW (ref >60)
Glucose, Bld: 99 mg/dL (ref 70–99)
Potassium: 3.7 mmol/L (ref 3.5–5.1)
Sodium: 141 mmol/L (ref 135–145)

## 2018-12-16 LAB — CBC WITH DIFFERENTIAL/PLATELET
Abs Immature Granulocytes: 0.04 10*3/uL (ref 0.00–0.07)
Basophils Absolute: 0.1 10*3/uL (ref 0.0–0.1)
Basophils Relative: 1 %
Eosinophils Absolute: 0.1 10*3/uL (ref 0.0–0.5)
Eosinophils Relative: 1 %
HCT: 46.3 % — ABNORMAL HIGH (ref 36.0–46.0)
Hemoglobin: 13.8 g/dL (ref 12.0–15.0)
Immature Granulocytes: 0 %
Lymphocytes Relative: 22 %
Lymphs Abs: 2.2 10*3/uL (ref 0.7–4.0)
MCH: 29.4 pg (ref 26.0–34.0)
MCHC: 29.8 g/dL — ABNORMAL LOW (ref 30.0–36.0)
MCV: 98.7 fL (ref 80.0–100.0)
Monocytes Absolute: 0.7 10*3/uL (ref 0.1–1.0)
Monocytes Relative: 7 %
Neutro Abs: 7 10*3/uL (ref 1.7–7.7)
Neutrophils Relative %: 69 %
Platelets: 262 10*3/uL (ref 150–400)
RBC: 4.69 MIL/uL (ref 3.87–5.11)
RDW: 13.9 % (ref 11.5–15.5)
WBC: 10.1 10*3/uL (ref 4.0–10.5)
nRBC: 0 % (ref 0.0–0.2)

## 2018-12-16 LAB — URINALYSIS, ROUTINE W REFLEX MICROSCOPIC
Bilirubin Urine: NEGATIVE
Glucose, UA: NEGATIVE mg/dL
Hgb urine dipstick: NEGATIVE
Ketones, ur: NEGATIVE mg/dL
Leukocytes, UA: NEGATIVE
Nitrite: NEGATIVE
Protein, ur: 100 mg/dL — AB
Specific Gravity, Urine: 1.034 — ABNORMAL HIGH (ref 1.005–1.030)
pH: 5 (ref 5.0–8.0)

## 2018-12-16 LAB — MAGNESIUM: Magnesium: 2.2 mg/dL (ref 1.7–2.4)

## 2018-12-16 LAB — TROPONIN I
Troponin I: <0.03 ng/mL (ref <0.03)
Troponin I: <0.03 ng/mL (ref <0.03)
Troponin I: <0.03 ng/mL (ref <0.03)

## 2018-12-16 LAB — LIPASE, BLOOD: Lipase: 32 U/L (ref 11–51)

## 2018-12-16 LAB — HEPATIC FUNCTION PANEL
ALT: 24 U/L (ref 0–44)
AST: 24 U/L (ref 15–41)
Albumin: 4.1 g/dL (ref 3.5–5.0)
Alkaline Phosphatase: 169 U/L — ABNORMAL HIGH (ref 38–126)
Bilirubin, Direct: 0.4 mg/dL — ABNORMAL HIGH (ref 0.0–0.2)
Indirect Bilirubin: 0.9 mg/dL (ref 0.3–0.9)
Total Bilirubin: 1.3 mg/dL — ABNORMAL HIGH (ref 0.3–1.2)
Total Protein: 7.6 g/dL (ref 6.5–8.1)

## 2018-12-16 MED ORDER — ONDANSETRON HCL 4 MG PO TABS
4.0000 mg | ORAL_TABLET | Freq: Four times a day (QID) | ORAL | Status: DC | PRN
  Administered 2018-12-16 – 2018-12-18 (×3): 4 mg via ORAL
  Filled 2018-12-16 (×3): qty 1

## 2018-12-16 MED ORDER — ACETAMINOPHEN 325 MG PO TABS
650.0000 mg | ORAL_TABLET | Freq: Four times a day (QID) | ORAL | Status: DC | PRN
  Administered 2018-12-17: 650 mg via ORAL
  Filled 2018-12-16: qty 2

## 2018-12-16 MED ORDER — ENOXAPARIN SODIUM 40 MG/0.4ML ~~LOC~~ SOLN
40.0000 mg | SUBCUTANEOUS | Status: DC
  Filled 2018-12-16 (×3): qty 0.4

## 2018-12-16 MED ORDER — ISOSORBIDE MONONITRATE ER 30 MG PO TB24
30.0000 mg | ORAL_TABLET | Freq: Every day | ORAL | Status: DC
  Administered 2018-12-16 – 2018-12-18 (×3): 30 mg via ORAL
  Filled 2018-12-16 (×3): qty 1

## 2018-12-16 MED ORDER — TICAGRELOR 60 MG PO TABS
60.0000 mg | ORAL_TABLET | Freq: Two times a day (BID) | ORAL | Status: DC
  Administered 2018-12-16 – 2018-12-18 (×5): 60 mg via ORAL
  Filled 2018-12-16 (×6): qty 1

## 2018-12-16 MED ORDER — MAGNESIUM OXIDE 400 (241.3 MG) MG PO TABS: 400.0000 mg | ORAL_TABLET | ORAL | Status: DC

## 2018-12-16 MED ORDER — PROMETHAZINE HCL 25 MG/ML IJ SOLN
12.5000 mg | Freq: Once | INTRAMUSCULAR | Status: AC
  Administered 2018-12-16: 12.5 mg via INTRAVENOUS
  Filled 2018-12-16: qty 1

## 2018-12-16 MED ORDER — FUROSEMIDE 40 MG PO TABS
80.0000 mg | ORAL_TABLET | Freq: Every day | ORAL | Status: DC
  Administered 2018-12-17: 80 mg via ORAL
  Filled 2018-12-16: qty 2

## 2018-12-16 MED ORDER — NITROGLYCERIN 0.4 MG SL SUBL: 0.4000 mg | SUBLINGUAL_TABLET | SUBLINGUAL | Status: DC | PRN

## 2018-12-16 MED ORDER — ATORVASTATIN CALCIUM 40 MG PO TABS
40.0000 mg | ORAL_TABLET | Freq: Every day | ORAL | Status: DC
  Administered 2018-12-16 – 2018-12-17 (×2): 40 mg via ORAL
  Filled 2018-12-16 (×3): qty 1

## 2018-12-16 MED ORDER — POTASSIUM CHLORIDE CRYS ER 20 MEQ PO TBCR
20.0000 meq | EXTENDED_RELEASE_TABLET | Freq: Every day | ORAL | Status: DC
  Administered 2018-12-16 – 2018-12-18 (×3): 20 meq via ORAL
  Filled 2018-12-16 (×3): qty 1

## 2018-12-16 MED ORDER — ACETAMINOPHEN 650 MG RE SUPP: 650.0000 mg | Freq: Four times a day (QID) | RECTAL | Status: DC | PRN

## 2018-12-16 MED ORDER — FERROUS SULFATE 325 (65 FE) MG PO TABS
325.0000 mg | ORAL_TABLET | ORAL | Status: DC
  Administered 2018-12-16: 325 mg via ORAL
  Filled 2018-12-16: qty 1

## 2018-12-16 MED ORDER — FUROSEMIDE 10 MG/ML IJ SOLN
80.0000 mg | Freq: Every day | INTRAMUSCULAR | Status: DC
  Administered 2018-12-16: 80 mg via INTRAVENOUS
  Filled 2018-12-16: qty 8

## 2018-12-16 MED ORDER — SPIRONOLACTONE 25 MG PO TABS
25.0000 mg | ORAL_TABLET | Freq: Every day | ORAL | Status: DC
  Administered 2018-12-16 – 2018-12-18 (×3): 25 mg via ORAL
  Filled 2018-12-16 (×3): qty 1

## 2018-12-16 MED ORDER — CARVEDILOL 6.25 MG PO TABS
6.2500 mg | ORAL_TABLET | Freq: Every day | ORAL | Status: DC
  Administered 2018-12-16 – 2018-12-18 (×3): 6.25 mg via ORAL
  Filled 2018-12-16 (×3): qty 1

## 2018-12-16 MED ORDER — ONDANSETRON HCL 4 MG/2ML IJ SOLN: 4.0000 mg | Freq: Four times a day (QID) | INTRAMUSCULAR | Status: DC | PRN

## 2018-12-16 NOTE — ED Notes (Signed)
Lab is at bedside attaining Trop.

## 2018-12-16 NOTE — Progress Notes (Signed)
PROGRESS NOTE    Deborah Ho  BTD:[id number] DOB: 05/24/1951 DOA: 12/15/2018 PCP: [facility]    Brief Narrative:   Deborah Ho is a 68 y.o. female with history of CAD status post stenting, ischemic cardiomyopathy, hypertension, chronic kidney disease stage III recently admitted for acute decompensation of chronic systolic heart failure presents to the ER with complaints of having not feeling well dizziness chest discomfort mainly epigastric discomfort with nausea.  Denies any vomiting.  Patient states the symptoms have been ongoing for last 1 week.  Patient states she has been compliant with her medication.  Denies any fever chills diarrhea.  ED Course: In the ER patient had MRI of the brain which was showing nothing acute.  CT abdomen pelvis done shows fluid collection around the pancreatic head and duodenum.  Abdominal exam appears benign.  LFT's normal.  EKG shows sinus tachycardia.  BNP was 1800.  Patient was given 80 mg IV Lasix following which patient diuresed well.  At the time of my exam patient still complains of discomfort in the epigastric area but has not had any chest pain at the time of my exam.  Throat does not look congested.    Assessment & Plan:   Principal Problem:   Acute on chronic systolic CHF (congestive heart failure) (HCC) Active Problems:   HLD (hyperlipidemia)   Ischemic cardiomyopathy   Essential hypertension   CKD (chronic kidney disease), stage III (HCC)   Acute CHF (congestive heart failure) (HCC)   Abdominal pain   Acute on chronic systolic congestive heart failure Patient presenting with generalized ill feeling, and epigastric discomfort.  Noted to have some fluid in her abdomen with history of cirrhosis.  Notable for BNP elevation at 1800.  Patient on Lasix 80 mg p.o. daily at home.  Was started on IV Lasix for fluid mobilization given symptoms with good diuresis. --Troponins negative --Will now transition back to her  home regimen of furosemide 80 mg p.o. daily and Aldactone --Continue Coreg 6.25 mg p.o. daily, Imdur 30 mg p.o. daily, Brilinta --Continue to monitor strict I's and O's and daily weights --Supportive care  Abdominal pain/epigastric discomfort Unclear etiology.  LFTs within normal limits.  Lipase normal.  CT abdomen/pelvis notable for some fluid collection surrounding the pancreas and duodenal sweep.  No history of cirrhosis.  Diuresed as above. --Supportive care --Repeat CMP in the a.m.  CKD stage III Creatinine stable, 1.4 --Repeat CMP in a.m.  History of cirrhosis --Continue Lasix 80mg  p.o. daily, and Aldactone 25 mg p.o. daily  Headache Dizziness --MRI head unrevealing   DVT prophylaxis: Lovenox Code Status: Full code Family Communication: None Disposition Plan: Home in the next 1-2 days   Consultants:   None  Procedures:   None  Antimicrobials:  None   Subjective:  Patient seen and examined at bedside in the ED holding area.  States symptoms slightly improved since admission.  Received dose of IV Lasix with good diuresis with improvement of dyspnea.  Dizziness improved.  No other complaints at this time.  Denies headache, no visual changes, no nausea/vomiting/diarrhea, no chest pain, no palpitations, no cough/congestion, no fever/chills/night sweats, no issues with bowel/bladder function, and no paresthesias.  No acute events overnight per nursing staff.  Objective: Vitals:   12/16/18 1137 12/16/18 1200 12/16/18 1400 12/16/18 1517  BP: 124/89 121/90 (!) 142/89 (!) 142/85  Pulse: (!) 101 97 98 94  Resp: 19 18 17    Temp:    (!) 97.4 F (36.3  C)  TempSrc:    Oral  SpO2: 97% 94% 95% 98%  Weight:      Height:        Intake/Output Summary (Last 24 hours) at 12/16/2018 1837 Last data filed at 12/16/2018 1700 Gross per 24 hour  Intake 240 ml  Output 300 ml  Net -60 ml   Filed Weights   12/15/18 1939  Weight: 88 kg    Examination:  General exam:  Appears calm and comfortable  Respiratory system: Clear to auscultation. Respiratory effort normal. Cardiovascular system: S1 & S2 heard, RRR. No JVD, murmurs, rubs, gallops or clicks. No pedal edema. Gastrointestinal system: Abdomen is nondistended, soft and nontender. No organomegaly or masses felt. Normal bowel sounds heard. Central nervous system: Alert and oriented. No focal neurological deficits. Extremities: Symmetric 5 x 5 power. Skin: No rashes, lesions or ulcers Psychiatry: Judgement and insight appear normal. Mood & affect appropriate.     Data Reviewed: I have personally reviewed following labs and imaging studies  CBC: Recent Labs  Lab 12/15/18 2022 12/16/18 0436  WBC 8.5 10.1  NEUTROABS  --  7.0  HGB 14.2 13.8  HCT 46.6* 46.3*  MCV 97.7 98.7  PLT 232 262   Basic Metabolic Panel: Recent Labs  Lab 12/15/18 2022 12/16/18 0434  NA 142 141  K 3.4* 3.7  CL 112* 111  CO2 18* 22  GLUCOSE 106* 99  BUN 22 25*  CREATININE 1.27* 1.40*  CALCIUM 9.4 9.3  MG  --  2.2   GFR: Estimated Creatinine Clearance: 43.2 mL/min (A) (by C-G formula based on SCr of 1.4 mg/dL (H)). Liver Function Tests: Recent Labs  Lab 12/15/18 2022 12/16/18 0434  AST 25 24  ALT 24 24  ALKPHOS 174* 169*  BILITOT 1.5* 1.3*  PROT 7.2 7.6  ALBUMIN 3.9 4.1   Recent Labs  Lab 12/15/18 2022 12/16/18 0434  LIPASE 29 32   No results for input(s): AMMONIA in the last 168 hours. Coagulation Profile: No results for input(s): INR, PROTIME in the last 168 hours. Cardiac Enzymes: Recent Labs  Lab 12/16/18 0434 12/16/18 1430  TROPONINI <0.03 <0.03   BNP (last 3 results) No results for input(s): PROBNP in the last 8760 hours. HbA1C: No results for input(s): HGBA1C in the last 72 hours. CBG: Recent Labs  Lab 12/15/18 1941  GLUCAP 125*   Lipid Profile: No results for input(s): CHOL, HDL, LDLCALC, TRIG, CHOLHDL, LDLDIRECT in the last 72 hours. Thyroid Function Tests: No results for  input(s): TSH, T4TOTAL, FREET4, T3FREE, THYROIDAB in the last 72 hours. Anemia Panel: No results for input(s): VITAMINB12, FOLATE, FERRITIN, TIBC, IRON, RETICCTPCT in the last 72 hours. Sepsis Labs: No results for input(s): PROCALCITON, LATICACIDVEN in the last 168 hours.  No results found for this or any previous visit (from the past 240 hour(s)).       Radiology Studies: Dg Chest 2 View  Result Date: 12/15/2018 CLINICAL DATA:  Dyspnea and productive cough for several days. EXAM: CHEST - 2 VIEW COMPARISON:  11/27/2018 FINDINGS: There has been interval clearing of right base consolidation and atelectasis. Stable cardiomegaly with minimal aortic atherosclerosis is redemonstrated. No new pulmonary consolidation nor overt pulmonary edema. No pleural effusion or pneumothorax. No acute osseous abnormality. IMPRESSION: No active cardiopulmonary disease. Stable cardiomegaly and aortic atherosclerosis. Electronically Signed   By: Tollie Ethavid  Kwon M.D.   On: 12/15/2018 22:57   Mr Brain Wo Contrast  Result Date: 12/15/2018 CLINICAL DATA:  Altered mental status EXAM: MRI HEAD WITHOUT  CONTRAST TECHNIQUE: Multiplanar, multiecho pulse sequences of the brain and surrounding structures were obtained without intravenous contrast. COMPARISON:  None. FINDINGS: BRAIN: Susceptibility effects from dental hardware partially obscure the frontal lobes on diffusion-weighted imaging. There is no acute infarct, acute hemorrhage, hydrocephalus or extra-axial collection. The midline structures are normal. No midline shift or other mass effect. Early confluent hyperintense T2-weighted signal of the periventricular and deep white matter, most commonly due to chronic ischemic microangiopathy. The cerebral and cerebellar volume are age-appropriate. Susceptibility-sensitive sequences show no chronic microhemorrhage or superficial siderosis. VASCULAR: Major intracranial arterial and venous sinus flow voids are normal. SKULL AND UPPER  CERVICAL SPINE: Calvarial bone marrow signal is normal. There is no skull base mass. Visualized upper cervical spine and soft tissues are normal. SINUSES/ORBITS: No fluid levels or advanced mucosal thickening. No mastoid or middle ear effusion. The orbits are normal. IMPRESSION: Chronic small vessel ischemia without acute intracranial abnormality. Electronically Signed   By: Deatra Robinson M.D.   On: 12/15/2018 22:29   Ct Abdomen Pelvis W Contrast  Result Date: 12/15/2018 CLINICAL DATA:  Abdominal pain, nausea and vomiting EXAM: CT ABDOMEN AND PELVIS WITH CONTRAST TECHNIQUE: Multidetector CT imaging of the abdomen and pelvis was performed using the standard protocol following bolus administration of intravenous contrast. CONTRAST:  ISOVUE-300 IOPAMIDOL (ISOVUE-300) INJECTION 61% COMPARISON:  Abdominal ultrasound for ascites 11/19/2017 FINDINGS: Lower chest: Cardiomegaly with small right effusion and peribronchial thickening to both lower lobes, right greater than left. Subsegmental atelectasis is noted the right middle lobe and both lower lobes. Hepatobiliary: Slight morphologic changes of cirrhosis. No space-occupying mass. Cholecystectomy. Pancreas: Trace edema in the right upper quadrant adjacent to the duodenal sweep. Mild peripancreatic edema is also suggested near the pancreatic head. Peptic ulcer disease, duodenitis or mild pancreatitis might contribute to this appearance. Sympathetic fluid from cirrhosis is also possibility. Spleen: Normal Adrenals/Urinary Tract: Normal bilateral adrenal glands. Symmetric cortical enhancement both kidneys. Simple cyst arising off the upper pole the right kidney is identified measuring 4.2 cm in diameter. A cyst arising off the lower pole of the left kidney is also noted measuring 1.9 cm in diameter without worrisome features. No solid enhancing renal mass. No nephrolithiasis nor hydroureteronephrosis. The urinary bladder is unremarkable for the degree of distention.  Stomach/Bowel: Normal appendix. Small hiatal hernia. Physiologic distention of the stomach with normal duodenal sweep and ligament of Treitz position. There does appear to be some trace fluid and edema emanating from the right upper quadrant off the region of the duodenum and the possibility of peptic ulcer disease accounting for this appearance or mild pancreatitis are some considerations. The colon is decompressed in appearance without acute inflammatory change or obstruction. Vascular/Lymphatic: No significant vascular findings are present. No enlarged abdominal or pelvic lymph nodes. Reproductive: Status post hysterectomy. No adnexal masses. Other: Small amount of free fluid in the pelvis. Soft tissue anasarca. Trace perihepatic ascites. Musculoskeletal: No acute nor suspicious osseous abnormality. T11-12 disc space narrowing. IMPRESSION: 1. Cardiomegaly with presumed inflammatory peribronchial thickening and small right effusion. 2. Morphologic changes of cirrhosis with small volume ascites. 3. There is edema and trace fluid in the right upper quadrant in the region of the duodenal sweep and pancreatic head. Differential considerations might include a lot nidus, pancreatitis or sympathetic fluid from cirrhosis. 4. Bilateral renal cysts without obstructive uropathy. 5. No evidence of diverticulitis or large bowel obstruction. Electronically Signed   By: Tollie Eth M.D.   On: 12/15/2018 23:18  Scheduled Meds: . atorvastatin  40 mg Oral q1800  . carvedilol  6.25 mg Oral Daily  . enoxaparin (LOVENOX) injection  40 mg Subcutaneous Q24H  . ferrous sulfate  325 mg Oral Once per day on Mon Wed Fri  . [START ON 12/17/2018] furosemide  80 mg Oral Daily  . isosorbide mononitrate  30 mg Oral Daily  . [START ON 12/19/2018] magnesium oxide  400 mg Oral Q14 Days  . potassium chloride SA  20 mEq Oral Daily  . spironolactone  25 mg Oral Daily  . ticagrelor  60 mg Oral BID   Continuous Infusions:   LOS: 1  day    Time spent: 32 minutes    Alvira Philips [place], DO [facility] Pager [phone]  If 7PM-7AM, please contact night-coverage [email or web address] Password [facility] 12/16/2018, 6:37 PM

## 2018-12-16 NOTE — Plan of Care (Signed)

## 2018-12-16 NOTE — H&P (Signed)
History and Physical    Deborah Ho ZOX:[id number] DOB: 04-25-1951 DOA: 12/15/2018  PCP: [facility]  Patient coming from: Home.  Chief Complaint: Multiple complaints including abdominal pain dizziness sore throat chest discomfort.  HPI: Deborah Ho is a 68 y.o. female with history of CAD status post stenting, ischemic cardiomyopathy, hypertension, chronic kidney disease stage III recently admitted for acute decompensation of chronic systolic heart failure presents to the ER with complaints of having not feeling well dizziness chest discomfort mainly epigastric discomfort with nausea.  Denies any vomiting.  Patient states the symptoms have been ongoing for last 1 week.  Patient states she has been compliant with her medication.  Denies any fever chills diarrhea.  ED Course: In the ER patient had MRI of the brain which was showing nothing acute.  CT abdomen pelvis done shows fluid collection around the pancreatic head and duodenum.  Abdominal exam appears benign.  Left is 1 normal.  EKG shows sinus tachycardia.  BNP was 1800.  Patient was given 80 mg IV Lasix following which patient diuresed well.  At the time of my exam patient still complains of discomfort in the epigastric area but has not had any chest pain at the time of my exam.  Throat does not look congested.   Review of Systems: As per HPI, rest all negative.   Past Medical History:  Diagnosis Date  . CAD (coronary artery disease) 05/03/14; 05/09/14   a. anterior STEMI with early in-stent thorombosis for missed dose of Brillinta s/p PCI with DESx 2 into LAD (04/2014)  . CHF (congestive heart failure) (HCC)   . GERD (gastroesophageal reflux disease)    Probable  . HTN (hypertension)   . Hyperlipidemia   . Ischemic cardiomyopathy    a. 04/2014 ECHO with EF 45-50% b.  Repeat 2D echo 08/14/14 with EF down at 15%. Life vest placed  . Non compliance w medication regimen   . Tobacco use     Past Surgical  History:  Procedure Laterality Date  . CHOLECYSTECTOMY    . CORONARY ANGIOPLASTY WITH STENT PLACEMENT  05/03/14   STEMI- stent to LAD DES- Xience alpine  . CORONARY ANGIOPLASTY WITH STENT PLACEMENT  05/09/14   STEMI- overlapping stent to LAD, pt had missed a dose of Brilinta  . LEFT HEART CATHETERIZATION WITH CORONARY ANGIOGRAM N/A 05/03/2014   Procedure: LEFT HEART CATHETERIZATION WITH CORONARY ANGIOGRAM;  Surgeon: Peter M Swaziland, MD;  Location: [facility] CATH LAB;  Service: Cardiovascular;  Laterality: N/A;  . LEFT HEART CATHETERIZATION WITH CORONARY ANGIOGRAM N/A 05/09/2014   Procedure: LEFT HEART CATHETERIZATION WITH CORONARY ANGIOGRAM;  Surgeon: Corky Crafts, MD;  Location: [facility] CATH LAB;  Service: Cardiovascular;  Laterality: N/A;  . PARTIAL HYSTERECTOMY    . PERCUTANEOUS STENT INTERVENTION  05/03/2014   Procedure: PERCUTANEOUS STENT INTERVENTION;  Surgeon: Peter M Swaziland, MD;  Location: [facility] CATH LAB;  Service: Cardiovascular;;  DES Prox LAD      reports that she has been smoking cigarettes. She has a 0.05 pack-year smoking history. She uses smokeless tobacco. She reports that she does not drink alcohol or use drugs.  Allergies  Allergen Reactions  . Aspirin Swelling    Chewable children's aspirin makes patients tongue and face swell  . Effient [Prasugrel] Swelling    Patient's tongue and face swells  . Entresto [Sacubitril-Valsartan] Other (See Comments)    dizziness  . Lactose Intolerance (Gi) Other (See Comments)    REACTION: stomach upset  .  Robitussin Dm [Guaifenesin-Dm] Swelling    Patient's tongue swells  . Sulfa Antibiotics Swelling  . Other     Had to replace "catgut" with clamps    Family History  Problem Relation Age of Onset  . Diabetes Mother   . Hypertension Mother     Prior to Admission medications   Medication Sig Start Date End Date Taking? Authorizing Provider  atorvastatin (LIPITOR) 40 MG tablet Take 1 tablet (40 mg total) by mouth daily at 6 PM. 03/18/18   Yes Sharol HarnessSimmons, Brittainy M, PA-C  benzonatate (TESSALON) 100 MG capsule Take 1 capsule (100 mg total) by mouth 3 (three) times daily as needed for cough. 12/01/18  Yes Alwyn RenMathews, Elizabeth G, MD  cetirizine (ZYRTEC) 10 MG tablet Take 10 mg daily by mouth.    Yes [provider]  ferrous sulfate 325 (65 FE) MG tablet Take 325 mg by mouth 3 (three) times a week. Monday,Wednesday, friday   Yes [provider]  furosemide (LASIX) 80 MG tablet Take 1 tablet (80 mg total) by mouth daily. 12/04/18  Yes Alwyn RenMathews, Elizabeth G, MD  metolazone (ZAROXOLYN) 2.5 MG tablet 4 times weekly Patient taking differently: Take 2.5 mg by mouth daily as needed (fluid).  07/11/18  Yes Graciella Freerillery, Michael Andrew, PA-C  potassium chloride SA (K-DUR,KLOR-CON) 20 MEQ tablet Take 1 tablet (20 mEq total) by mouth daily. 12/04/18  Yes Alwyn RenMathews, Elizabeth G, MD  spironolactone (ALDACTONE) 25 MG tablet Take 1 tablet (25 mg total) by mouth daily. 12/02/18  Yes Alwyn RenMathews, Elizabeth G, MD  carvedilol (COREG) 6.25 MG tablet Take 6.25 mg by mouth daily.    [provider]  cholecalciferol (VITAMIN D) 1000 units tablet Take 1,000 Units by mouth See admin instructions. Twice a month    [provider]  fluticasone (FLONASE) 50 MCG/ACT nasal spray Place 1 spray into both nostrils daily as needed for allergies (congestion).  04/04/18   [provider]  isosorbide mononitrate (IMDUR) 30 MG 24 hr tablet Take 1 tablet (30 mg total) by mouth daily. 12/04/18   Alwyn RenMathews, Elizabeth G, MD  magnesium oxide (MAG-OX) 400 (241.3 Mg) MG tablet Take 400 mg by mouth See admin instructions. Twice a month    [provider]  nitroGLYCERIN (NITROSTAT) 0.4 MG SL tablet Place 0.4 mg under the tongue every 5 (five) minutes as needed for chest pain.    [provider]  Pseudoephedrine-APAP-DM (DAYQUIL PO) Take 30 mLs by mouth daily as needed (cold / flu-like sxs).    [provider]  ticagrelor (BRILINTA) 60 MG  TABS tablet Take 1 tablet (60 mg total) by mouth 2 (two) times daily. 04/02/17   Dhungel, Nishant, MD  traMADol (ULTRAM) 50 MG tablet Take 1 tablet (50 mg total) by mouth every 12 (twelve) hours as needed. 12/01/18 12/01/19  Alwyn RenMathews, Elizabeth G, MD    Physical Exam: Vitals:   12/15/18 2130 12/15/18 2354 12/16/18 0000 12/16/18 0344  BP: (!) 149/105 (!) 145/110 (!) 136/94 (!) 137/101  Pulse: (!) 103 (!) 101 100 (!) 103  Resp: 18 20 18 16   Temp:      TempSrc:      SpO2: 98% 98% 96% 97%  Weight:      Height:          Constitutional: Moderately built and nourished. Vitals:   12/15/18 2130 12/15/18 2354 12/16/18 0000 12/16/18 0344  BP: (!) 149/105 (!) 145/110 (!) 136/94 (!) 137/101  Pulse: (!) 103 (!) 101 100 (!) 103  Resp: 18  Temp:      TempSrc:      SpO2: 98% 98% 96% 97%  Weight:      Height:       Eyes: Anicteric no pallor. ENMT: No discharge from the ears eyes nose and mouth. Neck: No mass felt.  No neck rigidity. Respiratory: No rhonchi or crepitations. Cardiovascular: S1-S2 heard. Abdomen: Soft nontender bowel sounds present. Musculoskeletal: No edema.  No joint effusion. Skin: No rash. Neurologic: Alert awake oriented to time place and person.  Moves all extremities. Psychiatric: Appears normal per normal affect.   Labs on Admission: I have personally reviewed following labs and imaging studies  CBC: Recent Labs  Lab 12/15/18 2022  WBC 8.5  HGB 14.2  HCT 46.6*  MCV 97.7  PLT 232   Basic Metabolic Panel: Recent Labs  Lab 12/15/18 2022  NA 142  K 3.4*  CL 112*  CO2 18*  GLUCOSE 106*  BUN 22  CREATININE 1.27*  CALCIUM 9.4   GFR: Estimated Creatinine Clearance: 47.6 mL/min (A) (by C-G formula based on SCr of 1.27 mg/dL (H)). Liver Function Tests: Recent Labs  Lab 12/15/18 2022  AST 25  ALT 24  ALKPHOS 174*  BILITOT 1.5*  PROT 7.2  ALBUMIN 3.9   Recent Labs  Lab 12/15/18 2022  LIPASE 29   No results for input(s): AMMONIA in the  last 168 hours. Coagulation Profile: No results for input(s): INR, PROTIME in the last 168 hours. Cardiac Enzymes: No results for input(s): CKTOTAL, CKMB, CKMBINDEX, TROPONINI in the last 168 hours. BNP (last 3 results) No results for input(s): PROBNP in the last 8760 hours. HbA1C: No results for input(s): HGBA1C in the last 72 hours. CBG: Recent Labs  Lab 12/15/18 1941  GLUCAP 125*   Lipid Profile: No results for input(s): CHOL, HDL, LDLCALC, TRIG, CHOLHDL, LDLDIRECT in the last 72 hours. Thyroid Function Tests: No results for input(s): TSH, T4TOTAL, FREET4, T3FREE, THYROIDAB in the last 72 hours. Anemia Panel: No results for input(s): VITAMINB12, FOLATE, FERRITIN, TIBC, IRON, RETICCTPCT in the last 72 hours. Urine analysis:    Component Value Date/Time   COLORURINE YELLOW 12/16/2018 0024   APPEARANCEUR CLEAR 12/16/2018 0024   LABSPEC 1.034 (H) 12/16/2018 0024   PHURINE 5.0 12/16/2018 0024   GLUCOSEU NEGATIVE 12/16/2018 0024   HGBUR NEGATIVE 12/16/2018 0024   BILIRUBINUR NEGATIVE 12/16/2018 0024   KETONESUR NEGATIVE 12/16/2018 0024   PROTEINUR 100 (A) 12/16/2018 0024   UROBILINOGEN 1.0 07/25/2015 1601   NITRITE NEGATIVE 12/16/2018 0024   LEUKOCYTESUR NEGATIVE 12/16/2018 0024   Sepsis Labs: (procalcitonin:4,lacticidven:4) )No results found for this or any previous visit (from the past 240 hour(s)).   Radiological Exams on Admission: Dg Chest 2 View  Result Date: 12/15/2018 CLINICAL DATA:  Dyspnea and productive cough for several days. EXAM: CHEST - 2 VIEW COMPARISON:  11/27/2018 FINDINGS: There has been interval clearing of right base consolidation and atelectasis. Stable cardiomegaly with minimal aortic atherosclerosis is redemonstrated. No new pulmonary consolidation nor overt pulmonary edema. No pleural effusion or pneumothorax. No acute osseous abnormality. IMPRESSION: No active cardiopulmonary disease. Stable cardiomegaly and aortic atherosclerosis.  Electronically Signed   By: Tollie Eth M.D.   On: 12/15/2018 22:57   Mr Brain Wo Contrast  Result Date: 12/15/2018 CLINICAL DATA:  Altered mental status EXAM: MRI HEAD WITHOUT CONTRAST TECHNIQUE: Multiplanar, multiecho pulse sequences of the brain and surrounding structures were obtained without intravenous contrast. COMPARISON:  None. FINDINGS: BRAIN: Susceptibility effects from dental hardware  partially obscure the frontal lobes on diffusion-weighted imaging. There is no acute infarct, acute hemorrhage, hydrocephalus or extra-axial collection. The midline structures are normal. No midline shift or other mass effect. Early confluent hyperintense T2-weighted signal of the periventricular and deep white matter, most commonly due to chronic ischemic microangiopathy. The cerebral and cerebellar volume are age-appropriate. Susceptibility-sensitive sequences show no chronic microhemorrhage or superficial siderosis. VASCULAR: Major intracranial arterial and venous sinus flow voids are normal. SKULL AND UPPER CERVICAL SPINE: Calvarial bone marrow signal is normal. There is no skull base mass. Visualized upper cervical spine and soft tissues are normal. SINUSES/ORBITS: No fluid levels or advanced mucosal thickening. No mastoid or middle ear effusion. The orbits are normal. IMPRESSION: Chronic small vessel ischemia without acute intracranial abnormality. Electronically Signed   By: Deatra Robinson M.D.   On: 12/15/2018 22:29   Ct Abdomen Pelvis W Contrast  Result Date: 12/15/2018 CLINICAL DATA:  Abdominal pain, nausea and vomiting EXAM: CT ABDOMEN AND PELVIS WITH CONTRAST TECHNIQUE: Multidetector CT imaging of the abdomen and pelvis was performed using the standard protocol following bolus administration of intravenous contrast. CONTRAST:  ISOVUE-300 IOPAMIDOL (ISOVUE-300) INJECTION 61% COMPARISON:  Abdominal ultrasound for ascites 11/19/2017 FINDINGS: Lower chest: Cardiomegaly with small right effusion and  peribronchial thickening to both lower lobes, right greater than left. Subsegmental atelectasis is noted the right middle lobe and both lower lobes. Hepatobiliary: Slight morphologic changes of cirrhosis. No space-occupying mass. Cholecystectomy. Pancreas: Trace edema in the right upper quadrant adjacent to the duodenal sweep. Mild peripancreatic edema is also suggested near the pancreatic head. Peptic ulcer disease, duodenitis or mild pancreatitis might contribute to this appearance. Sympathetic fluid from cirrhosis is also possibility. Spleen: Normal Adrenals/Urinary Tract: Normal bilateral adrenal glands. Symmetric cortical enhancement both kidneys. Simple cyst arising off the upper pole the right kidney is identified measuring 4.2 cm in diameter. A cyst arising off the lower pole of the left kidney is also noted measuring 1.9 cm in diameter without worrisome features. No solid enhancing renal mass. No nephrolithiasis nor hydroureteronephrosis. The urinary bladder is unremarkable for the degree of distention. Stomach/Bowel: Normal appendix. Small hiatal hernia. Physiologic distention of the stomach with normal duodenal sweep and ligament of Treitz position. There does appear to be some trace fluid and edema emanating from the right upper quadrant off the region of the duodenum and the possibility of peptic ulcer disease accounting for this appearance or mild pancreatitis are some considerations. The colon is decompressed in appearance without acute inflammatory change or obstruction. Vascular/Lymphatic: No significant vascular findings are present. No enlarged abdominal or pelvic lymph nodes. Reproductive: Status post hysterectomy. No adnexal masses. Other: Small amount of free fluid in the pelvis. Soft tissue anasarca. Trace perihepatic ascites. Musculoskeletal: No acute nor suspicious osseous abnormality. T11-12 disc space narrowing. IMPRESSION: 1. Cardiomegaly with presumed inflammatory peribronchial  thickening and small right effusion. 2. Morphologic changes of cirrhosis with small volume ascites. 3. There is edema and trace fluid in the right upper quadrant in the region of the duodenal sweep and pancreatic head. Differential considerations might include a lot nidus, pancreatitis or sympathetic fluid from cirrhosis. 4. Bilateral renal cysts without obstructive uropathy. 5. No evidence of diverticulitis or large bowel obstruction. Electronically Signed   By: Tollie Eth M.D.   On: 12/15/2018 23:18    EKG: Independently reviewed.  Sinus tachycardia.  Assessment/Plan Principal Problem:   Acute on chronic systolic CHF (congestive heart failure) (HCC) Active Problems:   HLD (hyperlipidemia)   Ischemic cardiomyopathy  Essential hypertension   CKD (chronic kidney disease), stage III (HCC)   Acute CHF (congestive heart failure) (HCC)   Abdominal pain    1. Acute on chronic systolic and diastolic heart failure last EF measured on November 28, 2018 3 weeks ago was 15% with grade 3 diastolic dysfunction.  Patient takes Lasix 80 mg daily and has been dosed 80 IV daily.  Patient's fluid collection around the abdomen and some pleural effusion in the right side with elevated BNP were concerning for acute decompensation.  Patient has not gained weight from previous.  For now we will closely monitor intake output metabolic panel.  Patient also takes Aldactone.  Patient also takes as needed metolazone if diuretics are not helping. 2. Abdominal discomfort -some fluid collection around the pancreatic head and duodenum.  LFTs and lipase are normal.  There is not enough fluid to get paracentesis.  Will diurese the patient and see if it improves a discomfort.  On other and also will check troponin to rule out ACS. 3. Epigastric discomfort chest discomfort with history of CAD we will cycle cardiac markers continue patient's Coreg Imdur Lipitor Brilinta. 4. Chronic kidney disease stage III creatinine appears to be  at baseline 5. Hypertension on we will continue the present medication including Imdur Aldactone and Coreg. 6. History of cirrhosis of the liver presently on diuretics including Lasix and Aldactone.  Closely monitor creatinine since patient is on Lasix. 7. Headache with some dizziness MRI is negative.   DVT prophylaxis: Lovenox. Code Status: Full code. Family Communication: Discussed with patient. Disposition Plan: Home. Consults called: None. Admission status: Inpatient.   Eduard ClosArshad N Devani Odonnel MD [facility] Pager [phone].  If 7PM-7AM, please contact night-coverage [email or web address] Password [email or web address]  12/16/2018, 4:29 AM

## 2018-12-16 NOTE — ED Notes (Signed)
Report given to [facility] for 4W, Room 1424.

## 2018-12-16 NOTE — ED Notes (Signed)
ED TO INPATIENT HANDOFF REPORT  Name/Age/Gender Deborah Ho 68 y.o. female  Code Status    Code Status Orders  (From admission, onward)         Start     Ordered   12/16/18 0426  Full code  Continuous     12/16/18 0427        Code Status History    Date Active Date Inactive Code Status Order ID Comments User Context   11/27/2018 2156 12/04/2018 1750 Full Code [id number]  Hillary Bow, DO ED   03/11/2018 0314 03/18/2018 1838 Full Code [id number]  Oliva Bustard, MD ED   11/18/2017 1656 11/26/2017 1801 Full Code [id number]  Sherald Hess, NP Inpatient   09/23/2017 0054 09/29/2017 1739 Full Code [id number]  Briscoe Deutscher, MD ED   03/26/2017 2044 04/02/2017 2105 Full Code [id number]  Glade Lloyd, MD Inpatient   02/18/2017 2035 02/21/2017 1852 Full Code [id number]  Inez Catalina, MD ED   01/28/2017 2226 02/06/2017 1842 Full Code [id number]  Alberteen Sam, MD Inpatient   06/26/2016 2049 07/06/2016 1744 Full Code [id number]  Haydee Salter, MD Inpatient   02/20/2016 1820 02/25/2016 1749 Full Code [id number]  Sherald Hess, NP Inpatient   08/12/2015 1615 08/19/2015 1813 Full Code [id number]  Sherald Hess, NP Inpatient   01/29/2015 1258 02/05/2015 1839 Full Code [id number]  Eddie North, MD ED   09/14/2014 0203 09/19/2014 1846 Full Code [id number]  Ardis Rowan, MD Inpatient   08/13/2014 2311 08/17/2014 1844 Full Code [id number]  [name], PA Inpatient   05/09/2014 1321 05/12/2014 2110 Full Code [id number]  Dwana Melena, PA-C Inpatient   05/09/2014 1253 05/09/2014 1321 Full Code [id number]  Corky Crafts, MD Inpatient   05/03/2014 2105 05/06/2014 1335 Full Code [id number]  [place], Peter M, MD Inpatient   05/03/2014 2105 05/03/2014 2105 Full Code [id number]  Barrett, Joline Salt, PA-C Inpatient      Home/SNF/Other Home  Chief Complaint ears,throat pains, dizziness, vomiting, abdomianl pains  Level of Care/Admitting Diagnosis ED Disposition    ED Disposition Condition Comment   Admit   Hospital Area: [facility] [[id number]]  Level of Care: Telemetry [5]  Admit to tele based on following criteria: Acute CHF  Diagnosis: Acute CHF (congestive heart failure) [facility]) [[id number]]  Admitting Physician: Eduard Clos [phone]  Attending Physician: Eduard Clos [phone]  Estimated length of stay: past midnight tomorrow  Certification:: I certify this patient will need inpatient services for at least 2 midnights  PT Class (Do Not Modify): Inpatient [101]  PT Acc Code (Do Not Modify): Private [1]       Medical History Past Medical History:  Diagnosis Date  . CAD (coronary artery disease) 05/03/14; 05/09/14   a. anterior STEMI with early in-stent thorombosis for missed dose of Brillinta s/p PCI with DESx 2 into LAD (04/2014)  . CHF (congestive heart failure) (HCC)   . GERD (gastroesophageal reflux disease)    Probable  . HTN (hypertension)   . Hyperlipidemia   . Ischemic cardiomyopathy    a. 04/2014 ECHO with EF 45-50% b.  Repeat 2D echo 08/14/14 with EF down at 15%. Life vest placed  . Non compliance w medication regimen   . Tobacco use     Allergies Allergies  Allergen Reactions  . Aspirin Swelling    Chewable children's aspirin makes patients tongue and face swell  . Effient [Prasugrel] Swelling    Patient's tongue and face swells  .  Entresto [Sacubitril-Valsartan] Other (See Comments)    dizziness  . Lactose Intolerance (Gi) Other (See Comments)    REACTION: stomach upset  . Robitussin Dm [Guaifenesin-Dm] Swelling    Patient's tongue swells  . Sulfa Antibiotics Swelling  . Other     Had to replace "catgut" with clamps    IV Location/Drains/Wounds Patient Lines/Drains/Airways Status   Active Line/Drains/Airways    Name:   Placement date:   Placement time:   Site:   Days:   Peripheral IV 12/15/18 Left Antecubital   12/15/18    2235    Antecubital   1   Incision (Closed) 01/28/17 Leg Left;Posterior   01/28/17    2300     687   Wound /  Incision (Open or Dehisced) 02/18/17 Venous stasis ulcer Leg Left Open Ulcer on left posterior leg   02/18/17    2130    Leg   666          Labs/Imaging Results for orders placed or performed during the hospital encounter of 12/15/18 (from the past 48 hour(s))  CBG monitoring, ED     Status: Abnormal   Collection Time: 12/15/18  7:41 PM  Result Value Ref Range   Glucose-Capillary 125 (H) 70 - 99 mg/dL  Lipase, blood     Status: None   Collection Time: 12/15/18  8:22 PM  Result Value Ref Range   Lipase 29 11 - 51 U/L    Comment: Performed at [facility], [address], [place], [place] [address]  Comprehensive metabolic panel     Status: Abnormal   Collection Time: 12/15/18  8:22 PM  Result Value Ref Range   Sodium 142 135 - 145 mmol/L   Potassium 3.4 (L) 3.5 - 5.1 mmol/L   Chloride 112 (H) 98 - 111 mmol/L   CO2 18 (L) 22 - 32 mmol/L   Glucose, Bld 106 (H) 70 - 99 mg/dL   BUN 22 8 - 23 mg/dL   Creatinine, Ser 2.45 (H) 0.44 - 1.00 mg/dL   Calcium 9.4 8.9 - 80.9 mg/dL   Total Protein 7.2 6.5 - 8.1 g/dL   Albumin 3.9 3.5 - 5.0 g/dL   AST 25 15 - 41 U/L   ALT 24 0 - 44 U/L   Alkaline Phosphatase 174 (H) 38 - 126 U/L   Total Bilirubin 1.5 (H) 0.3 - 1.2 mg/dL   GFR calc non Af Amer 44 (L) >60 mL/min   GFR calc Af Amer 51 (L) >60 mL/min   Anion gap 12 5 - 15    Comment: Performed at [facility], [address], [place], [place] [address]  CBC     Status: Abnormal   Collection Time: 12/15/18  8:22 PM  Result Value Ref Range   WBC 8.5 4.0 - 10.5 K/uL   RBC 4.77 3.87 - 5.11 MIL/uL   Hemoglobin 14.2 12.0 - 15.0 g/dL   HCT 25.0 (H) 53.9 - 76.7 %   MCV 97.7 80.0 - 100.0 fL   MCH 29.8 26.0 - 34.0 pg   MCHC 30.5 30.0 - 36.0 g/dL   RDW 34.1 93.7 - 90.2 %   Platelets 232 150 - 400 K/uL   nRBC 0.0 0.0 - 0.2 %    Comment: Performed at [facility], [address] [place], [place] [address]  Brain natriuretic peptide      Status: Abnormal   Collection Time: 12/15/18  8:22 PM  Result Value Ref Range  B Natriuretic Peptide 1,850.9 (H) 0.0 - 100.0 pg/mL    Comment: Performed at [facility], [address] [place], [place] [address]  I-Stat Troponin, ED (not at [facility])     Status: None   Collection Time: 12/15/18  8:28 PM  Result Value Ref Range   Troponin i, poc 0.00 0.00 - 0.08 ng/mL   Comment 3            Comment: Due to the release kinetics of cTnI, a negative result within the first hours of the onset of symptoms does not rule out myocardial infarction with certainty. If myocardial infarction is still suspected, repeat the test at appropriate intervals.   Urinalysis, Routine w reflex microscopic     Status: Abnormal   Collection Time: 12/16/18 12:24 AM  Result Value Ref Range   Color, Urine YELLOW YELLOW   APPearance CLEAR CLEAR   Specific Gravity, Urine 1.034 (H) 1.005 - 1.030   pH 5.0 5.0 - 8.0   Glucose, UA NEGATIVE NEGATIVE mg/dL   Hgb urine dipstick NEGATIVE NEGATIVE   Bilirubin Urine NEGATIVE NEGATIVE   Ketones, ur NEGATIVE NEGATIVE mg/dL   Protein, ur 956 (A) NEGATIVE mg/dL   Nitrite NEGATIVE NEGATIVE   Leukocytes, UA NEGATIVE NEGATIVE   RBC / HPF 0-5 0 - 5 RBC/hpf   WBC, UA 0-5 0 - 5 WBC/hpf   Bacteria, UA RARE (A) NONE SEEN   Squamous Epithelial / LPF 0-5 0 - 5   Mucus PRESENT    Hyaline Casts, UA PRESENT     Comment: Performed at [facility], [address] [place], [place] [address]  Basic metabolic panel     Status: Abnormal   Collection Time: 12/16/18  4:34 AM  Result Value Ref Range   Sodium 141 135 - 145 mmol/L   Potassium 3.7 3.5 - 5.1 mmol/L   Chloride 111 98 - 111 mmol/L   CO2 22 22 - 32 mmol/L   Glucose, Bld 99 70 - 99 mg/dL   BUN 25 (H) 8 - 23 mg/dL   Creatinine, Ser 6.57 (H) 0.44 - 1.00 mg/dL   Calcium 9.3 8.9 - 84.6 mg/dL   GFR calc non Af Amer 39 (L) >60 mL/min   GFR calc Af Amer 45 (L) >60 mL/min   Anion gap 8 5 - 15     Comment: Performed at [facility], [address], [place], [place] [address]  Hepatic function panel     Status: Abnormal   Collection Time: 12/16/18  4:34 AM  Result Value Ref Range   Total Protein 7.6 6.5 - 8.1 g/dL   Albumin 4.1 3.5 - 5.0 g/dL   AST 24 15 - 41 U/L   ALT 24 0 - 44 U/L   Alkaline Phosphatase 169 (H) 38 - 126 U/L   Total Bilirubin 1.3 (H) 0.3 - 1.2 mg/dL   Bilirubin, Direct 0.4 (H) 0.0 - 0.2 mg/dL   Indirect Bilirubin 0.9 0.3 - 0.9 mg/dL    Comment: Performed at [facility], [address] [place], [place] [address]  Magnesium     Status: None   Collection Time: 12/16/18  4:34 AM  Result Value Ref Range   Magnesium 2.2 1.7 - 2.4 mg/dL    Comment: Performed at [facility], [address] [place], [place] [address]  Troponin I - Now Then Q6H     Status: None   Collection Time: 12/16/18  4:34 AM  Result Value Ref Range   Troponin I <0.03 <0.03 ng/mL    Comment: Performed at [facility], [address] [place], [place] [address]  Lipase, blood     Status: None   Collection Time: 12/16/18  4:34 AM  Result Value Ref Range   Lipase 32 11 - 51 U/L    Comment: Performed at [facility], [address], [place], [place] [address]  CBC with Differential     Status: Abnormal   Collection Time: 12/16/18  4:36 AM  Result Value Ref Range   WBC 10.1 4.0 - 10.5 K/uL   RBC 4.69 3.87 - 5.11 MIL/uL   Hemoglobin 13.8 12.0 - 15.0 g/dL   HCT 09.8 (H) 11.9 - 14.7 %   MCV 98.7 80.0 - 100.0 fL   MCH 29.4 26.0 - 34.0 pg   MCHC 29.8 (L) 30.0 - 36.0 g/dL   RDW 82.9 56.2 - 13.0 %   Platelets 262 150 - 400 K/uL   nRBC 0.0 0.0 - 0.2 %   Neutrophils Relative % 69 %   Neutro Abs 7.0 1.7 - 7.7 K/uL   Lymphocytes Relative 22 %   Lymphs Abs 2.2 0.7 - 4.0 K/uL   Monocytes Relative 7 %   Monocytes Absolute 0.7 0.1 - 1.0 K/uL   Eosinophils Relative 1 %   Eosinophils Absolute 0.1 0.0 - 0.5  K/uL   Basophils Relative 1 %   Basophils Absolute 0.1 0.0 - 0.1 K/uL   Immature Granulocytes 0 %   Abs Immature Granulocytes 0.04 0.00 - 0.07 K/uL    Comment: Performed at [facility], [address] [place], [place] [address]   Dg Chest 2 View  Result Date: 12/15/2018 CLINICAL DATA:  Dyspnea and productive cough for several days. EXAM: CHEST - 2 VIEW COMPARISON:  11/27/2018 FINDINGS: There has been interval clearing of right base consolidation and atelectasis. Stable cardiomegaly with minimal aortic atherosclerosis is redemonstrated. No new pulmonary consolidation nor overt pulmonary edema. No pleural effusion or pneumothorax. No acute osseous abnormality. IMPRESSION: No active cardiopulmonary disease. Stable cardiomegaly and aortic atherosclerosis. Electronically Signed   By: Tollie Eth M.D.   On: 12/15/2018 22:57   Mr Brain Wo Contrast  Result Date: 12/15/2018 CLINICAL DATA:  Altered mental status EXAM: MRI HEAD WITHOUT CONTRAST TECHNIQUE: Multiplanar, multiecho pulse sequences of the brain and surrounding structures were obtained without intravenous contrast. COMPARISON:  None. FINDINGS: BRAIN: Susceptibility effects from dental hardware partially obscure the frontal lobes on diffusion-weighted imaging. There is no acute infarct, acute hemorrhage, hydrocephalus or extra-axial collection. The midline structures are normal. No midline shift or other mass effect. Early confluent hyperintense T2-weighted signal of the periventricular and deep white matter, most commonly due to chronic ischemic microangiopathy. The cerebral and cerebellar volume are age-appropriate. Susceptibility-sensitive sequences show no chronic microhemorrhage or superficial siderosis. VASCULAR: Major intracranial arterial and venous sinus flow voids are normal. SKULL AND UPPER CERVICAL SPINE: Calvarial bone marrow signal is normal. There is no skull base mass. Visualized upper cervical spine and soft tissues  are normal. SINUSES/ORBITS: No fluid levels or advanced mucosal thickening. No mastoid or middle ear effusion. The orbits are normal. IMPRESSION: Chronic small vessel ischemia without acute intracranial abnormality. Electronically Signed   By: Deatra Robinson M.D.   On: 12/15/2018 22:29   Ct Abdomen Pelvis W Contrast  Result Date: 12/15/2018 CLINICAL DATA:  Abdominal pain, nausea and vomiting EXAM: CT ABDOMEN AND PELVIS WITH CONTRAST TECHNIQUE: Multidetector CT  imaging of the abdomen and pelvis was performed using the standard protocol following bolus administration of intravenous contrast. CONTRAST:  100mL ISOVUE-300 IOPAMIDOL (ISOVUE-300) INJECTION 61% COMPARISON:  Abdominal ultrasound for ascites 11/19/2017 FINDINGS: Lower chest: Cardiomegaly with small right effusion and peribronchial thickening to both lower lobes, right greater than left. Subsegmental atelectasis is noted the right middle lobe and both lower lobes. Hepatobiliary: Slight morphologic changes of cirrhosis. No space-occupying mass. Cholecystectomy. Pancreas: Trace edema in the right upper quadrant adjacent to the duodenal sweep. Mild peripancreatic edema is also suggested near the pancreatic head. Peptic ulcer disease, duodenitis or mild pancreatitis might contribute to this appearance. Sympathetic fluid from cirrhosis is also possibility. Spleen: Normal Adrenals/Urinary Tract: Normal bilateral adrenal glands. Symmetric cortical enhancement both kidneys. Simple cyst arising off the upper pole the right kidney is identified measuring 4.2 cm in diameter. A cyst arising off the lower pole of the left kidney is also noted measuring 1.9 cm in diameter without worrisome features. No solid enhancing renal mass. No nephrolithiasis nor hydroureteronephrosis. The urinary bladder is unremarkable for the degree of distention. Stomach/Bowel: Normal appendix. Small hiatal hernia. Physiologic distention of the stomach with normal duodenal sweep and ligament of  Treitz position. There does appear to be some trace fluid and edema emanating from the right upper quadrant off the region of the duodenum and the possibility of peptic ulcer disease accounting for this appearance or mild pancreatitis are some considerations. The colon is decompressed in appearance without acute inflammatory change or obstruction. Vascular/Lymphatic: No significant vascular findings are present. No enlarged abdominal or pelvic lymph nodes. Reproductive: Status post hysterectomy. No adnexal masses. Other: Small amount of free fluid in the pelvis. Soft tissue anasarca. Trace perihepatic ascites. Musculoskeletal: No acute nor suspicious osseous abnormality. T11-12 disc space narrowing. IMPRESSION: 1. Cardiomegaly with presumed inflammatory peribronchial thickening and small right effusion. 2. Morphologic changes of cirrhosis with small volume ascites. 3. There is edema and trace fluid in the right upper quadrant in the region of the duodenal sweep and pancreatic head. Differential considerations might include a lot nidus, pancreatitis or sympathetic fluid from cirrhosis. 4. Bilateral renal cysts without obstructive uropathy. 5. No evidence of diverticulitis or large bowel obstruction. Electronically Signed   By: Tollie Ethavid  Kwon M.D.   On: 12/15/2018 23:18   EKG Interpretation  Date/Time:  Thursday December 15 2018 19:39:08 EST Ventricular Rate:  106 PR Interval:    QRS Duration: 91 QT Interval:  355 QTC Calculation: 472 R Axis:   -77 Text Interpretation:  Sinus tachycardia Inferior infarct, old Anterior infarct, old Lateral leads are also involved Baseline wander in lead(s) V2 V6 no significant change since Nov 28 2018 Confirmed by Pricilla LovelessGoldston, Scott [phone]([id number]) on 12/15/2018 9:31:16 PM   Pending Labs Unresulted Labs (From admission, onward)    Start     Ordered   12/23/18 0500  Creatinine, serum  (enoxaparin (LOVENOX)    CrCl >/= 30 ml/min)  Weekly,   R    Comments:  while on enoxaparin therapy     12/16/18 0427   12/16/18 0428  Troponin I - Now Then Q6H  Now then every 6 hours,   R     12/16/18 0427          Vitals/Pain Today's Vitals   12/16/18 1002 12/16/18 1137 12/16/18 1200 12/16/18 1400  BP: 121/88 124/89 121/90 (!) 142/89  Pulse: 97 (!) 101 97 98  Resp: 18 19 18 17   Temp:      TempSrc:  SpO2: 98% 97% 94% 95%  Weight:      Height:      PainSc: 0-No pain       Isolation Precautions No active isolations  Medications Medications  atorvastatin (LIPITOR) tablet 40 mg (has no administration in time range)  carvedilol (COREG) tablet 6.25 mg (6.25 mg Oral Given 12/16/18 1003)  isosorbide mononitrate (IMDUR) 24 hr tablet 30 mg (30 mg Oral Given 12/16/18 1003)  nitroGLYCERIN (NITROSTAT) SL tablet 0.4 mg (has no administration in time range)  spironolactone (ALDACTONE) tablet 25 mg (25 mg Oral Given 12/16/18 1003)  ferrous sulfate tablet 325 mg (325 mg Oral Given 12/16/18 0930)  ticagrelor (BRILINTA) tablet 60 mg (60 mg Oral Given 12/16/18 1003)  magnesium oxide (MAG-OX) tablet 400 mg (has no administration in time range)  potassium chloride SA (K-DUR,KLOR-CON) CR tablet 20 mEq (20 mEq Oral Given 12/16/18 1004)  acetaminophen (TYLENOL) tablet 650 mg (has no administration in time range)    Or  acetaminophen (TYLENOL) suppository 650 mg (has no administration in time range)  ondansetron (ZOFRAN) tablet 4 mg (has no administration in time range)    Or  ondansetron (ZOFRAN) injection 4 mg (has no administration in time range)  enoxaparin (LOVENOX) injection 40 mg (40 mg Subcutaneous Refused 12/16/18 1004)  furosemide (LASIX) injection 80 mg (80 mg Intravenous Given 12/16/18 1005)  ondansetron (ZOFRAN-ODT) disintegrating tablet 4 mg (4 mg Oral Given 12/15/18 1957)  potassium chloride SA (K-DUR,KLOR-CON) CR tablet 40 mEq (40 mEq Oral Given 12/15/18 2244)  fentaNYL (SUBLIMAZE) injection 100 mcg (100 mcg Intravenous Given 12/15/18 2240)  ondansetron (ZOFRAN) injection 4 mg (4 mg  Intravenous Given 12/15/18 2240)  iopamidol (ISOVUE-300) 61 % injection 100 mL (100 mLs Intravenous Contrast Given 12/15/18 2254)  furosemide (LASIX) injection 80 mg (80 mg Intravenous Given 12/15/18 2358)  promethazine (PHENERGAN) injection 12.5 mg (12.5 mg Intravenous Given 12/16/18 0343)    Mobility walks

## 2018-12-17 MED ORDER — BUMETANIDE 1 MG PO TABS
2.0000 mg | ORAL_TABLET | Freq: Every day | ORAL | Status: DC
  Administered 2018-12-17 – 2018-12-18 (×2): 2 mg via ORAL
  Filled 2018-12-17 (×3): qty 2

## 2018-12-17 MED ORDER — BUMETANIDE 2 MG PO TABS: 2.0000 mg | ORAL_TABLET | Freq: Every day | ORAL | 11 refills | Status: DC

## 2018-12-17 NOTE — Plan of Care (Signed)

## 2018-12-17 NOTE — Progress Notes (Signed)
PROGRESS NOTE    Deborah Ho  AYO:[id number] DOB: Nov 10, 1951 DOA: 12/15/2018 PCP: [facility]    Brief Narrative:   Deborah Ho is a 68 y.o. female with history of CAD status post stenting, ischemic cardiomyopathy, hypertension, chronic kidney disease stage III recently admitted for acute decompensation of chronic systolic heart failure presents to the ER with complaints of having not feeling well dizziness chest discomfort mainly epigastric discomfort with nausea.  Denies any vomiting.  Patient states the symptoms have been ongoing for last 1 week.  Patient states she has been compliant with her medication.  Denies any fever chills diarrhea.  ED Course: In the ER patient had MRI of the brain which was showing nothing acute.  CT abdomen pelvis done shows fluid collection around the pancreatic head and duodenum.  Abdominal exam appears benign.  LFT's normal.  EKG shows sinus tachycardia.  BNP was 1800.  Patient was given 80 mg IV Lasix following which patient diuresed well.  At the time of my exam patient still complains of discomfort in the epigastric area but has not had any chest pain at the time of my exam.  Throat does not look congested.   12/17/2018: Patient presented to the hospital with what appeared to be URI symptoms, as well as symptoms and signs of worsening CHF with known EF of 15%, liver cirrhosis possible cardiac cirrhosis versus Nash, but cannot rule out other causes of cirrhosis.  Edema has improved significantly.  Respiratory symptoms have improved significantly as well.  Well continue to optimize patient.  Likely discharge back home in the morning.  We need to follow-up with cardiology team, and the GI team, ID and nephrology on discharge.   Assessment & Plan:   Principal Problem:   Acute on chronic systolic CHF (congestive heart failure) (HCC) Active Problems:   HLD (hyperlipidemia)   Ischemic cardiomyopathy   Essential hypertension   CKD  (chronic kidney disease), stage III (HCC)   Acute CHF (congestive heart failure) (HCC)   Abdominal pain   Acute on chronic systolic congestive heart failure Patient presenting with generalized ill feeling, and epigastric discomfort.  Noted to have some fluid in her abdomen with history of cirrhosis.  Notable for BNP elevation at 1800.  Patient on Lasix 80 mg p.o. daily at home.  Was started on IV Lasix for fluid mobilization given symptoms with good diuresis. --Troponins negative --Will now transition back to her home regimen of furosemide 80 mg p.o. daily and Aldactone --Continue Coreg 6.25 mg p.o. daily, Imdur 30 mg p.o. daily, Brilinta --Continue to monitor strict I's and O's and daily weights --Supportive care 12/05/2018: We discontinue oral Lasix.  Will start patient on Bumex 2 mg p.o. once daily.  I suspect patient is not absorbing oral Lasix due to gut edema.  Abdominal pain/epigastric discomfort Unclear etiology.  LFTs within normal limits.  Lipase normal.  CT abdomen/pelvis notable for some fluid collection surrounding the pancreas and duodenal sweep.  No history of cirrhosis.  Diuresed as above. --Supportive care --Repeat CMP in the a.m. 12/17/2018: Resolved significantly.  CKD stage III Creatinine stable, 1.4 --Repeat CMP in a.m. 12/05/2018: Suspect component of cardiorenal syndrome.  History of cirrhosis --Continue Lasix 80mg  p.o. daily, and Aldactone 25 mg p.o. daily 12/05/2018: Continue above.  Headache Dizziness --MRI head unrevealing   DVT prophylaxis: Lovenox Code Status: Full code Family Communication: None Disposition Plan: Home in the next 1-2 days   Consultants:   None  Procedures:  None  Antimicrobials:  None   Subjective: Seen alongside patient's nurse.  No new complaints.  Patient continues to report chronic edema (patient is known to the cardiology team, query GI team on nephrology).  Objective: Vitals:   12/16/18 1400 12/16/18 1517  12/16/18 2120 12/17/18 0500  BP: (!) 142/89 (!) 142/85 110/75   Pulse: 98 94 93   Resp: 17  16   Temp:  (!) 97.4 F (36.3 C) 97.7 F (36.5 C)   TempSrc:  Oral Oral   SpO2: 95% 98% 99%   Weight:    92.7 kg  Height:        Intake/Output Summary (Last 24 hours) at 12/17/2018 1507 Last data filed at 12/17/2018 1048 Gross per 24 hour  Intake 720 ml  Output 300 ml  Net 420 ml   Filed Weights   12/15/18 1939 12/17/18 0500  Weight: 88 kg 92.7 kg    Examination:  General exam: Appears calm and comfortable  Respiratory system: Decreased air entry.   Cardiovascular system: S1 & S2 heard. Gastrointestinal system: Abdomen is obese, soft and nontender progress are difficult to assess.   Central nervous system: Alert and oriented. No focal neurological deficits. Extremities: Mild edema of lower legs.  Data Reviewed: I have personally reviewed following labs and imaging studies  CBC: Recent Labs  Lab 12/15/18 2022 12/16/18 0436  WBC 8.5 10.1  NEUTROABS  --  7.0  HGB 14.2 13.8  HCT 46.6* 46.3*  MCV 97.7 98.7  PLT 232 262   Basic Metabolic Panel: Recent Labs  Lab 12/15/18 2022 12/16/18 0434  NA 142 141  K 3.4* 3.7  CL 112* 111  CO2 18* 22  GLUCOSE 106* 99  BUN 22 25*  CREATININE 1.27* 1.40*  CALCIUM 9.4 9.3  MG  --  2.2   GFR: Estimated Creatinine Clearance: 44.3 mL/min (A) (by C-G formula based on SCr of 1.4 mg/dL (H)). Liver Function Tests: Recent Labs  Lab 12/15/18 2022 12/16/18 0434  AST 25 24  ALT 24 24  ALKPHOS 174* 169*  BILITOT 1.5* 1.3*  PROT 7.2 7.6  ALBUMIN 3.9 4.1   Recent Labs  Lab 12/15/18 2022 12/16/18 0434  LIPASE 29 32   No results for input(s): AMMONIA in the last 168 hours. Coagulation Profile: No results for input(s): INR, PROTIME in the last 168 hours. Cardiac Enzymes: Recent Labs  Lab 12/16/18 0434 12/16/18 1430 12/16/18 2033  TROPONINI <0.03 <0.03 <0.03   BNP (last 3 results) No results for input(s): PROBNP in the last  8760 hours. HbA1C: No results for input(s): HGBA1C in the last 72 hours. CBG: Recent Labs  Lab 12/15/18 1941  GLUCAP 125*   Lipid Profile: No results for input(s): CHOL, HDL, LDLCALC, TRIG, CHOLHDL, LDLDIRECT in the last 72 hours. Thyroid Function Tests: No results for input(s): TSH, T4TOTAL, FREET4, T3FREE, THYROIDAB in the last 72 hours. Anemia Panel: No results for input(s): VITAMINB12, FOLATE, FERRITIN, TIBC, IRON, RETICCTPCT in the last 72 hours. Sepsis Labs: No results for input(s): PROCALCITON, LATICACIDVEN in the last 168 hours.  No results found for this or any previous visit (from the past 240 hour(s)).       Radiology Studies: Dg Chest 2 View  Result Date: 12/15/2018 CLINICAL DATA:  Dyspnea and productive cough for several days. EXAM: CHEST - 2 VIEW COMPARISON:  11/27/2018 FINDINGS: There has been interval clearing of right base consolidation and atelectasis. Stable cardiomegaly with minimal aortic atherosclerosis is redemonstrated. No new pulmonary consolidation nor overt  pulmonary edema. No pleural effusion or pneumothorax. No acute osseous abnormality. IMPRESSION: No active cardiopulmonary disease. Stable cardiomegaly and aortic atherosclerosis. Electronically Signed   By: Tollie Eth M.D.   On: 12/15/2018 22:57   Mr Brain Wo Contrast  Result Date: 12/15/2018 CLINICAL DATA:  Altered mental status EXAM: MRI HEAD WITHOUT CONTRAST TECHNIQUE: Multiplanar, multiecho pulse sequences of the brain and surrounding structures were obtained without intravenous contrast. COMPARISON:  None. FINDINGS: BRAIN: Susceptibility effects from dental hardware partially obscure the frontal lobes on diffusion-weighted imaging. There is no acute infarct, acute hemorrhage, hydrocephalus or extra-axial collection. The midline structures are normal. No midline shift or other mass effect. Early confluent hyperintense T2-weighted signal of the periventricular and deep white matter, most commonly due  to chronic ischemic microangiopathy. The cerebral and cerebellar volume are age-appropriate. Susceptibility-sensitive sequences show no chronic microhemorrhage or superficial siderosis. VASCULAR: Major intracranial arterial and venous sinus flow voids are normal. SKULL AND UPPER CERVICAL SPINE: Calvarial bone marrow signal is normal. There is no skull base mass. Visualized upper cervical spine and soft tissues are normal. SINUSES/ORBITS: No fluid levels or advanced mucosal thickening. No mastoid or middle ear effusion. The orbits are normal. IMPRESSION: Chronic small vessel ischemia without acute intracranial abnormality. Electronically Signed   By: Deatra Robinson M.D.   On: 12/15/2018 22:29   Ct Abdomen Pelvis W Contrast  Result Date: 12/15/2018 CLINICAL DATA:  Abdominal pain, nausea and vomiting EXAM: CT ABDOMEN AND PELVIS WITH CONTRAST TECHNIQUE: Multidetector CT imaging of the abdomen and pelvis was performed using the standard protocol following bolus administration of intravenous contrast. CONTRAST:  ISOVUE-300 IOPAMIDOL (ISOVUE-300) INJECTION 61% COMPARISON:  Abdominal ultrasound for ascites 11/19/2017 FINDINGS: Lower chest: Cardiomegaly with small right effusion and peribronchial thickening to both lower lobes, right greater than left. Subsegmental atelectasis is noted the right middle lobe and both lower lobes. Hepatobiliary: Slight morphologic changes of cirrhosis. No space-occupying mass. Cholecystectomy. Pancreas: Trace edema in the right upper quadrant adjacent to the duodenal sweep. Mild peripancreatic edema is also suggested near the pancreatic head. Peptic ulcer disease, duodenitis or mild pancreatitis might contribute to this appearance. Sympathetic fluid from cirrhosis is also possibility. Spleen: Normal Adrenals/Urinary Tract: Normal bilateral adrenal glands. Symmetric cortical enhancement both kidneys. Simple cyst arising off the upper pole the right kidney is identified measuring 4.2 cm  in diameter. A cyst arising off the lower pole of the left kidney is also noted measuring 1.9 cm in diameter without worrisome features. No solid enhancing renal mass. No nephrolithiasis nor hydroureteronephrosis. The urinary bladder is unremarkable for the degree of distention. Stomach/Bowel: Normal appendix. Small hiatal hernia. Physiologic distention of the stomach with normal duodenal sweep and ligament of Treitz position. There does appear to be some trace fluid and edema emanating from the right upper quadrant off the region of the duodenum and the possibility of peptic ulcer disease accounting for this appearance or mild pancreatitis are some considerations. The colon is decompressed in appearance without acute inflammatory change or obstruction. Vascular/Lymphatic: No significant vascular findings are present. No enlarged abdominal or pelvic lymph nodes. Reproductive: Status post hysterectomy. No adnexal masses. Other: Small amount of free fluid in the pelvis. Soft tissue anasarca. Trace perihepatic ascites. Musculoskeletal: No acute nor suspicious osseous abnormality. T11-12 disc space narrowing. IMPRESSION: 1. Cardiomegaly with presumed inflammatory peribronchial thickening and small right effusion. 2. Morphologic changes of cirrhosis with small volume ascites. 3. There is edema and trace fluid in the right upper quadrant in the region of the  duodenal sweep and pancreatic head. Differential considerations might include a lot nidus, pancreatitis or sympathetic fluid from cirrhosis. 4. Bilateral renal cysts without obstructive uropathy. 5. No evidence of diverticulitis or large bowel obstruction. Electronically Signed   By: Tollie Ethavid  Kwon M.D.   On: 12/15/2018 23:18        Scheduled Meds: . atorvastatin  40 mg Oral q1800  . bumetanide  2 mg Oral Daily  . carvedilol  6.25 mg Oral Daily  . enoxaparin (LOVENOX) injection  40 mg Subcutaneous Q24H  . ferrous sulfate  325 mg Oral Once per day on Mon Wed  Fri  . isosorbide mononitrate  30 mg Oral Daily  . [START ON 12/19/2018] magnesium oxide  400 mg Oral Q14 Days  . potassium chloride SA  20 mEq Oral Daily  . spironolactone  25 mg Oral Daily  . ticagrelor  60 mg Oral BID   Continuous Infusions:   LOS: 2 days    Time spent: 53 minutes    Barnetta ChapelSylvester I Carman Essick, MD [facility] Pager [phone]  If 7PM-7AM, please contact night-coverage [email or web address] Password [email or web address] 12/17/2018, 3:07 PM

## 2018-12-18 NOTE — Care Management Note (Signed)
Case Management Note  Patient Details  Name: Emmelyn Leibrock MRN: [id number] Date of Birth: [birth year]  Subjective/Objective:   CHF                 Action/Plan: Pt states she had [facility] in the past. Requesting [facility] for [facility]. Contacted [facility] with new referral. Placed information for [facility] for pt to call and arrange follow up appt.   Expected Discharge Date:  12/18/18               Expected Discharge Plan:  Home w Home Health Services  In-House Referral:  NA  Discharge planning Services  CM Consult  Post Acute Care Choice:  Home Health Choice offered to:  Patient  DME Arranged:  N/A DME Agency:  NA  HH Arranged:  RN HH Agency:  [facility]  Status of Service:  Completed, signed off  If discussed at Long Length of Stay Meetings, dates discussed:    Additional Comments:  Elliot Cousin, RN 12/18/2018, 4:00 PM

## 2018-12-18 NOTE — Discharge Summary (Signed)
Physician Discharge Summary  Patient ID: Deborah Ho MRN: [id number] DOB/AGE: 1951-04-04 68 y.o.  Admit date: 12/15/2018 Discharge date: 12/18/2018  Admission Diagnoses:  Discharge Diagnoses:  Principal Problem:   Acute on chronic combined systolic and diastolic CHF  Active Problems:   HLD (hyperlipidemia)   Ischemic cardiomyopathy   Essential hypertension   CKD (chronic kidney disease), stage III (HCC)   Liver cirrhosis   Abdominal pain  Discharged Condition: stable  Hospital Course:  Deborah McCallumis a 67 year oldfemalewithpast medical history significant for CAD status post stenting, ischemic cardiomyopathy, hypertension and chronic kidney disease stage III.  Patient was recently admitted with acute decompensation of chronic combined systolic and diastolic heart failure.  Patient presented with decompensation of congestive heart failure, volume overload and possible viral syndrome.  Prior ECHO revealed EF of 15%.  Patient was admitted for further assessment and management.  Patient was started on IV Lasix.  With aggressive diuretics, edema improved.  URI symptoms have also resolved. CT of abdomen and pelvis with contrast revealed "Cardiomegaly with presumed inflammatory peribronchial thickening and small right effusion.  Morphologic changes of cirrhosis with small volume ascites.  There is edema and trace fluid in the right upper quadrant in the region of the duodenal sweep and pancreatic head. Differential considerations might include a lot nidus, pancreatitis or sympathetic fluid from cirrhosis.  Bilateral renal cysts without obstructive uropathy.  No evidence of diverticulitis or large bowel obstruction".  LFT's were normal.    Acute on chronic systolic congestive heart failure: -Started on IV lasix on admission. -Patient will be discharged on oral Bumex. -Patient will follow up with PCP and Cardiology on discharge. --Troponins negative --Continue Coreg and  Aldactone. -Consider Bidil, but will defer to cardiology -When renal function is more stable, consider ACEI or Entresto, but will defer to Cardiology and PCP.  --Continue to monitor strict I's and O's and daily weights  Abdominal pain/epigastric discomfort -Unclear etiology.   -LFTs within normal limits.   -Lipase normal.   -CT abdomen/pelvis notable for some fluid collection surrounding the pancreas and duodenal sweep.   -Supportive care  CKD stage III -Creatinine stable at 1.4 -Repeat CMP in a.m.  History of cirrhosis --Continue bumex and Aldactone   Headache/Dizziness: -MRI head unrevealing  Consults: None  Significant Diagnostic Studies:   Discharge Exam: Blood pressure 107/73, pulse 88, temperature 97.7 F (36.5 C), temperature source Oral, resp. rate 16, height 5' 5.5" (1.664 m), weight 92.5 kg, SpO2 98 %.  Disposition: Discharge disposition: 06-Home-Health Care Svc  Discharge Instructions    Diet - low sodium heart healthy   Complete by:  As directed    Diet - low sodium heart healthy   Complete by:  As directed    Discharge instructions   Complete by:  As directed    Weigh self daily   Increase activity slowly   Complete by:  As directed    Increase activity slowly   Complete by:  As directed      Allergies as of 12/18/2018      Reactions   Aspirin Swelling   Chewable children's aspirin makes patients tongue and face swell   Effient [prasugrel] Swelling   Patient's tongue and face swells   Entresto [sacubitril-valsartan] Other (See Comments)   dizziness   Lactose Intolerance (gi) Other (See Comments)   REACTION: stomach upset   Robitussin Dm [guaifenesin-dm] Swelling   Patient's tongue swells   Sulfa Antibiotics Swelling   Other    Had to replace "catgut"  with clamps      Medication List    STOP taking these medications   benzonatate 100 MG capsule Commonly known as:  TESSALON   DAYQUIL PO   furosemide 80 MG tablet Commonly known as:   LASIX   traMADol 50 MG tablet Commonly known as:  ULTRAM     TAKE these medications   atorvastatin 40 MG tablet Commonly known as:  LIPITOR Take 1 tablet (40 mg total) by mouth daily at 6 PM.   bumetanide 2 MG tablet Commonly known as:  BUMEX Take 1 tablet (2 mg total) by mouth daily.   carvedilol 6.25 MG tablet Commonly known as:  COREG Take 6.25 mg by mouth daily.   cetirizine 10 MG tablet Commonly known as:  ZYRTEC Take 10 mg daily by mouth.   cholecalciferol 1000 units tablet Commonly known as:  VITAMIN D Take 1,000 Units by mouth See admin instructions. Twice a month   ferrous sulfate 325 (65 FE) MG tablet Take 325 mg by mouth 3 (three) times a week. Monday,Wednesday, friday   fluticasone 50 MCG/ACT nasal spray Commonly known as:  FLONASE Place 1 spray into both nostrils daily as needed for allergies (congestion).   isosorbide mononitrate 30 MG 24 hr tablet Commonly known as:  IMDUR Take 1 tablet (30 mg total) by mouth daily.   magnesium oxide 400 (241.3 Mg) MG tablet Commonly known as:  MAG-OX Take 400 mg by mouth See admin instructions. Twice a month   metolazone 2.5 MG tablet Commonly known as:  ZAROXOLYN 4 times weekly What changed:    how much to take  how to take this  when to take this  reasons to take this  additional instructions   nitroGLYCERIN 0.4 MG SL tablet Commonly known as:  NITROSTAT Place 0.4 mg under the tongue every 5 (five) minutes as needed for chest pain.   potassium chloride SA 20 MEQ tablet Commonly known as:  K-DUR,KLOR-CON Take 1 tablet (20 mEq total) by mouth daily.   spironolactone 25 MG tablet Commonly known as:  ALDACTONE Take 1 tablet (25 mg total) by mouth daily.   ticagrelor 60 MG Tabs tablet Commonly known as:  BRILINTA Take 1 tablet (60 mg total) by mouth 2 (two) times daily.        SignedBarnetta Chapel 12/18/2018, 2:01 PM

## 2018-12-29 ENCOUNTER — Telehealth: Payer: Self-pay | Admitting: Physician Assistant

## 2018-12-29 NOTE — Telephone Encounter (Signed)
Message sent to Dr.Jordan to make him aware.

## 2018-12-29 NOTE — Telephone Encounter (Signed)
New Message   Lawanna Kobus from [facility] states she's being trying to reach patient for care and patient isn't answering phone calls.  Lawanna Kobus has canceled patient's appointment for 12/27/18, and will attempt to see the patient for her appointment on 12/31/18.  Just wanted to make the doctor aware of the issue she's having with seeing patient.

## 2018-12-30 ENCOUNTER — Telehealth: Payer: Self-pay | Admitting: Cardiology

## 2018-12-30 NOTE — Telephone Encounter (Signed)
Angel from [facility] states the patient will not return calls for her nurse visits. She wanted Dr Swaziland to be aware of the noncompliance

## 2018-12-30 NOTE — Telephone Encounter (Signed)
Update fwd to Dr.Jordan and his nurse Elnita Maxwell

## 2018-12-30 NOTE — Telephone Encounter (Signed)
Not surprised.   Peter Swaziland MD, [facility]

## 2019-01-13 ENCOUNTER — Other Ambulatory Visit: Payer: Self-pay

## 2019-01-13 ENCOUNTER — Inpatient Hospital Stay (HOSPITAL_COMMUNITY): Payer: Medicare Other

## 2019-01-13 ENCOUNTER — Emergency Department (HOSPITAL_COMMUNITY): Payer: Medicare Other

## 2019-01-13 ENCOUNTER — Encounter (HOSPITAL_COMMUNITY): Payer: Self-pay | Admitting: *Deleted

## 2019-01-13 ENCOUNTER — Inpatient Hospital Stay (HOSPITAL_COMMUNITY)
Admission: EM | Admit: 2019-01-13 | Discharge: 2019-01-18 | DRG: 291 | Disposition: A | Discharge status: Home Health | Payer: Medicare Other | Attending: Internal Medicine | Admitting: Internal Medicine

## 2019-01-13 DIAGNOSIS — Z833 Family history of diabetes mellitus: Secondary | ICD-10-CM | POA: Diagnosis not present

## 2019-01-13 DIAGNOSIS — Z23 Encounter for immunization: Secondary | ICD-10-CM | POA: Diagnosis not present

## 2019-01-13 DIAGNOSIS — K219 Gastro-esophageal reflux disease without esophagitis: Secondary | ICD-10-CM | POA: Diagnosis present

## 2019-01-13 DIAGNOSIS — I5023 Acute on chronic systolic (congestive) heart failure: Secondary | ICD-10-CM | POA: Diagnosis present

## 2019-01-13 DIAGNOSIS — I2510 Atherosclerotic heart disease of native coronary artery without angina pectoris: Secondary | ICD-10-CM | POA: Diagnosis present

## 2019-01-13 DIAGNOSIS — F17210 Nicotine dependence, cigarettes, uncomplicated: Secondary | ICD-10-CM | POA: Diagnosis present

## 2019-01-13 DIAGNOSIS — N183 Chronic kidney disease, stage 3 unspecified: Secondary | ICD-10-CM | POA: Diagnosis present

## 2019-01-13 DIAGNOSIS — I255 Ischemic cardiomyopathy: Secondary | ICD-10-CM | POA: Diagnosis present

## 2019-01-13 DIAGNOSIS — I5043 Acute on chronic combined systolic (congestive) and diastolic (congestive) heart failure: Secondary | ICD-10-CM | POA: Diagnosis present

## 2019-01-13 DIAGNOSIS — Z955 Presence of coronary angioplasty implant and graft: Secondary | ICD-10-CM | POA: Diagnosis not present

## 2019-01-13 DIAGNOSIS — Z9119 Patient's noncompliance with other medical treatment and regimen: Secondary | ICD-10-CM | POA: Diagnosis not present

## 2019-01-13 DIAGNOSIS — I2583 Coronary atherosclerosis due to lipid rich plaque: Secondary | ICD-10-CM | POA: Diagnosis not present

## 2019-01-13 DIAGNOSIS — I10 Essential (primary) hypertension: Secondary | ICD-10-CM | POA: Diagnosis present

## 2019-01-13 DIAGNOSIS — Z886 Allergy status to analgesic agent status: Secondary | ICD-10-CM

## 2019-01-13 DIAGNOSIS — R112 Nausea with vomiting, unspecified: Secondary | ICD-10-CM | POA: Diagnosis present

## 2019-01-13 DIAGNOSIS — I272 Pulmonary hypertension, unspecified: Secondary | ICD-10-CM | POA: Diagnosis present

## 2019-01-13 DIAGNOSIS — I509 Heart failure, unspecified: Secondary | ICD-10-CM

## 2019-01-13 DIAGNOSIS — E785 Hyperlipidemia, unspecified: Secondary | ICD-10-CM | POA: Diagnosis present

## 2019-01-13 DIAGNOSIS — I340 Nonrheumatic mitral (valve) insufficiency: Secondary | ICD-10-CM | POA: Diagnosis present

## 2019-01-13 DIAGNOSIS — Z8249 Family history of ischemic heart disease and other diseases of the circulatory system: Secondary | ICD-10-CM

## 2019-01-13 DIAGNOSIS — Z7902 Long term (current) use of antithrombotics/antiplatelets: Secondary | ICD-10-CM | POA: Diagnosis not present

## 2019-01-13 DIAGNOSIS — I252 Old myocardial infarction: Secondary | ICD-10-CM | POA: Diagnosis not present

## 2019-01-13 DIAGNOSIS — R109 Unspecified abdominal pain: Secondary | ICD-10-CM

## 2019-01-13 DIAGNOSIS — Z9111 Patient's noncompliance with dietary regimen: Secondary | ICD-10-CM | POA: Diagnosis not present

## 2019-01-13 DIAGNOSIS — I130 Hypertensive heart and chronic kidney disease with heart failure and stage 1 through stage 4 chronic kidney disease, or unspecified chronic kidney disease: Secondary | ICD-10-CM | POA: Diagnosis not present

## 2019-01-13 DIAGNOSIS — R1030 Lower abdominal pain, unspecified: Secondary | ICD-10-CM | POA: Diagnosis not present

## 2019-01-13 DIAGNOSIS — E7800 Pure hypercholesterolemia, unspecified: Secondary | ICD-10-CM | POA: Diagnosis not present

## 2019-01-13 DIAGNOSIS — R140 Abdominal distension (gaseous): Secondary | ICD-10-CM | POA: Diagnosis present

## 2019-01-13 DIAGNOSIS — N170 Acute kidney failure with tubular necrosis: Secondary | ICD-10-CM | POA: Diagnosis not present

## 2019-01-13 LAB — CREATININE, SERUM
Creatinine, Ser: 1.35 mg/dL — ABNORMAL HIGH (ref 0.44–1.00)
GFR calc Af Amer: 47 mL/min — ABNORMAL LOW (ref >60)
GFR calc non Af Amer: 41 mL/min — ABNORMAL LOW (ref >60)

## 2019-01-13 LAB — CBC
HCT: 47 % — ABNORMAL HIGH (ref 36.0–46.0)
HCT: 49.6 % — ABNORMAL HIGH (ref 36.0–46.0)
Hemoglobin: 14.2 g/dL (ref 12.0–15.0)
Hemoglobin: 14.6 g/dL (ref 12.0–15.0)
MCH: 28.8 pg (ref 26.0–34.0)
MCH: 28.3 pg (ref 26.0–34.0)
MCHC: 30.2 g/dL (ref 30.0–36.0)
MCHC: 29.4 g/dL — ABNORMAL LOW (ref 30.0–36.0)
MCV: 95.3 fL (ref 80.0–100.0)
MCV: 96.3 fL (ref 80.0–100.0)
Platelets: 244 10*3/uL (ref 150–400)
Platelets: 269 10*3/uL (ref 150–400)
RBC: 4.93 MIL/uL (ref 3.87–5.11)
RBC: 5.15 MIL/uL — ABNORMAL HIGH (ref 3.87–5.11)
RDW: 14.8 % (ref 11.5–15.5)
RDW: 14.7 % (ref 11.5–15.5)
WBC: 7.9 10*3/uL (ref 4.0–10.5)
WBC: 8.1 10*3/uL (ref 4.0–10.5)
nRBC: 0 % (ref 0.0–0.2)
nRBC: 0 % (ref 0.0–0.2)

## 2019-01-13 LAB — TROPONIN I: Troponin I: <0.03 ng/mL (ref <0.03)

## 2019-01-13 LAB — BASIC METABOLIC PANEL
Anion gap: 11 (ref 5–15)
BUN: 21 mg/dL (ref 8–23)
CO2: 20 mmol/L — ABNORMAL LOW (ref 22–32)
Calcium: 9.1 mg/dL (ref 8.9–10.3)
Chloride: 111 mmol/L (ref 98–111)
Creatinine, Ser: 1.38 mg/dL — ABNORMAL HIGH (ref 0.44–1.00)
GFR calc Af Amer: 46 mL/min — ABNORMAL LOW (ref >60)
GFR calc non Af Amer: 39 mL/min — ABNORMAL LOW (ref >60)
Glucose, Bld: 112 mg/dL — ABNORMAL HIGH (ref 70–99)
Potassium: 3.2 mmol/L — ABNORMAL LOW (ref 3.5–5.1)
Sodium: 142 mmol/L (ref 135–145)

## 2019-01-13 LAB — BRAIN NATRIURETIC PEPTIDE: B Natriuretic Peptide: 1888.6 pg/mL — ABNORMAL HIGH (ref 0.0–100.0)

## 2019-01-13 MED ORDER — POTASSIUM CHLORIDE CRYS ER 20 MEQ PO TBCR
20.0000 meq | EXTENDED_RELEASE_TABLET | Freq: Every day | ORAL | Status: DC
  Administered 2019-01-13: 20 meq via ORAL
  Filled 2019-01-13: qty 1

## 2019-01-13 MED ORDER — SODIUM CHLORIDE 0.9% FLUSH
3.0000 mL | Freq: Two times a day (BID) | INTRAVENOUS | Status: DC
  Administered 2019-01-13 – 2019-01-18 (×5): 3 mL via INTRAVENOUS

## 2019-01-13 MED ORDER — ONDANSETRON HCL 4 MG/2ML IJ SOLN
4.0000 mg | Freq: Four times a day (QID) | INTRAMUSCULAR | Status: DC | PRN
  Administered 2019-01-13 – 2019-01-17 (×4): 4 mg via INTRAVENOUS
  Filled 2019-01-13 (×4): qty 2

## 2019-01-13 MED ORDER — FLUTICASONE PROPIONATE 50 MCG/ACT NA SUSP: 1.0000 | Freq: Every day | NASAL | Status: DC | PRN

## 2019-01-13 MED ORDER — ACETAMINOPHEN 325 MG PO TABS
650.0000 mg | ORAL_TABLET | ORAL | Status: DC | PRN
  Administered 2019-01-14 – 2019-01-16 (×3): 650 mg via ORAL
  Filled 2019-01-13 (×3): qty 2

## 2019-01-13 MED ORDER — ALBUTEROL SULFATE (2.5 MG/3ML) 0.083% IN NEBU
5.0000 mg | INHALATION_SOLUTION | Freq: Once | RESPIRATORY_TRACT | Status: AC
  Administered 2019-01-13: 5 mg via RESPIRATORY_TRACT
  Filled 2019-01-13: qty 6

## 2019-01-13 MED ORDER — SODIUM CHLORIDE 0.9 % IV SOLN: 250.0000 mL | INTRAVENOUS | Status: DC | PRN

## 2019-01-13 MED ORDER — FUROSEMIDE 10 MG/ML IJ SOLN
40.0000 mg | Freq: Four times a day (QID) | INTRAMUSCULAR | Status: DC
  Administered 2019-01-13 – 2019-01-14 (×2): 40 mg via INTRAVENOUS
  Filled 2019-01-13 (×2): qty 4

## 2019-01-13 MED ORDER — ATORVASTATIN CALCIUM 40 MG PO TABS
40.0000 mg | ORAL_TABLET | Freq: Every day | ORAL | Status: DC
  Administered 2019-01-14 – 2019-01-17 (×4): 40 mg via ORAL
  Filled 2019-01-13 (×4): qty 1

## 2019-01-13 MED ORDER — LORATADINE 10 MG PO TABS
10.0000 mg | ORAL_TABLET | Freq: Every day | ORAL | Status: DC
  Administered 2019-01-14 – 2019-01-18 (×5): 10 mg via ORAL
  Filled 2019-01-13 (×5): qty 1

## 2019-01-13 MED ORDER — HEPARIN SODIUM (PORCINE) 5000 UNIT/ML IJ SOLN
5000.0000 [IU] | Freq: Three times a day (TID) | INTRAMUSCULAR | Status: DC
  Administered 2019-01-14 – 2019-01-18 (×3): 5000 [IU] via SUBCUTANEOUS
  Filled 2019-01-13 (×11): qty 1

## 2019-01-13 MED ORDER — POTASSIUM CHLORIDE CRYS ER 20 MEQ PO TBCR
40.0000 meq | EXTENDED_RELEASE_TABLET | Freq: Once | ORAL | Status: AC
  Administered 2019-01-13: 40 meq via ORAL
  Filled 2019-01-13: qty 2

## 2019-01-13 MED ORDER — SODIUM CHLORIDE 0.9% FLUSH
3.0000 mL | INTRAVENOUS | Status: DC | PRN
  Administered 2019-01-14: 3 mL via INTRAVENOUS
  Filled 2019-01-13: qty 3

## 2019-01-13 MED ORDER — FUROSEMIDE 10 MG/ML IJ SOLN
80.0000 mg | Freq: Once | INTRAMUSCULAR | Status: AC
  Administered 2019-01-13: 80 mg via INTRAVENOUS
  Filled 2019-01-13: qty 8

## 2019-01-13 MED ORDER — ISOSORBIDE MONONITRATE ER 30 MG PO TB24
30.0000 mg | ORAL_TABLET | Freq: Every day | ORAL | Status: DC
  Administered 2019-01-13 – 2019-01-18 (×6): 30 mg via ORAL
  Filled 2019-01-13 (×6): qty 1

## 2019-01-13 NOTE — ED Notes (Signed)
Attempted to give report. RN unavailable at this time. Will attempt again.

## 2019-01-13 NOTE — ED Provider Notes (Signed)
[facility] Emergency Department Provider Note MRN:  [id number]  Arrival date & time: 01/13/19     Chief Complaint   Shortness of Breath   History of Present Illness   Deborah Ho is a 68 y.o. year-old female with a history of CAD, CHF presenting to the ED with chief complaint of shortness of breath.  Patient explains that she was discharged from the hospital for a heart failure exacerbation February 1.  As instructed, she filled her new prescription for Bumex.  This was a change from her prior diuretic.  She has been having trouble with this new medicine.  She explains that after she takes the Bumex pill, she begins having coughing spells.  The only thing that seems to help her cough is drinking a bottle of water.  For the past week has had slowly gradually worsening shortness of breath.  Feels a sensation of extra fluid on her body.  Denies fever.  Persistent cough, no chest pain, no abdominal pain, no dysuria.  Symptoms are moderate, constant, no exacerbating relieving factors.  Review of Systems  A complete 10 system review of systems was obtained and all systems are negative except as noted in the HPI and PMH.   Patient's Health History    Past Medical History:  Diagnosis Date  . CAD (coronary artery disease) 05/03/14; 05/09/14   a. anterior STEMI with early in-stent thorombosis for missed dose of Brillinta s/p PCI with DESx 2 into LAD (04/2014)  . CHF (congestive heart failure) (HCC)   . GERD (gastroesophageal reflux disease)    Probable  . HTN (hypertension)   . Hyperlipidemia   . Ischemic cardiomyopathy    a. 04/2014 ECHO with EF 45-50% b.  Repeat 2D echo 08/14/14 with EF down at 15%. Life vest placed  . Non compliance w medication regimen   . Tobacco use     Past Surgical History:  Procedure Laterality Date  . CHOLECYSTECTOMY    . CORONARY ANGIOPLASTY WITH STENT PLACEMENT  05/03/14   STEMI- stent to LAD DES- Xience alpine  . CORONARY ANGIOPLASTY WITH  STENT PLACEMENT  05/09/14   STEMI- overlapping stent to LAD, pt had missed a dose of Brilinta  . LEFT HEART CATHETERIZATION WITH CORONARY ANGIOGRAM N/A 05/03/2014   Procedure: LEFT HEART CATHETERIZATION WITH CORONARY ANGIOGRAM;  Surgeon: Peter M Swaziland, MD;  Location: [facility] CATH LAB;  Service: Cardiovascular;  Laterality: N/A;  . LEFT HEART CATHETERIZATION WITH CORONARY ANGIOGRAM N/A 05/09/2014   Procedure: LEFT HEART CATHETERIZATION WITH CORONARY ANGIOGRAM;  Surgeon: Corky Crafts, MD;  Location: [facility] CATH LAB;  Service: Cardiovascular;  Laterality: N/A;  . PARTIAL HYSTERECTOMY    . PERCUTANEOUS STENT INTERVENTION  05/03/2014   Procedure: PERCUTANEOUS STENT INTERVENTION;  Surgeon: Peter M Swaziland, MD;  Location: [facility] CATH LAB;  Service: Cardiovascular;;  DES Prox LAD     Family History  Problem Relation Age of Onset  . Diabetes Mother   . Hypertension Mother     Social History   Socioeconomic History  . Marital status: Widowed    Spouse name: Not on file  . Number of children: Not on file  . Years of education: Not on file  . Highest education level: Not on file  Occupational History  . 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 Financial resource strain: Not on file  . Food insecurity:    Worry: Not on file    Inability: Not on file  . Transportation needs:  Medical: Not on file    Non-medical: Not on file  Tobacco Use  . Smoking status: Current Some Day Smoker    Packs/day: 0.10    Years: 0.50    Pack years: 0.05    Types: Cigarettes  . Smokeless tobacco: Current User  . Tobacco comment: down to 3 cigarettes daily (08/03/14)  Substance and Sexual Activity  . Alcohol use: No  . Drug use: No  . Sexual activity: Not on file  Lifestyle  . Physical activity:    Days per week: Not on file    Minutes per session: Not on file  . Stress: Not on file  Relationships  . Social connections:    Talks on phone: Not on file    Gets together: Not on file    Attends religious  service: Not on file    Active member of club or organization: Not on file    Attends meetings of clubs or organizations: Not on file    Relationship status: Not on file  . Intimate partner violence:    Fear of current or ex partner: Not on file    Emotionally abused: Not on file    Physically abused: Not on file    Forced sexual activity: Not on file  Other Topics Concern  . Not on file  Social History Narrative   Patient has 6 brothers and sisters and none have known coronary artery disease. She lives alone in [place], but has several brothers in the area.     Physical Exam  Vital Signs and Nursing Notes reviewed Vitals:   01/13/19 1641 01/13/19 1651  BP:  (!) 135/92  Pulse: (!) 103 95  Resp: 17 18  Temp:    SpO2: 98% 96%    CONSTITUTIONAL: Well-appearing, NAD NEURO:  Alert and oriented x 3, no focal deficits EYES:  eyes equal and reactive ENT/NECK:  no LAD, no JVD CARDIO: Regular rate, well-perfused, normal S1 and S2 PULM: Decreased breath sounds bilateral bases, scattered rhonchi GI/GU:  normal bowel sounds, moderately distended, non-tender MSK/SPINE:  No gross deformities, no edema SKIN:  no rash, atraumatic PSYCH:  Appropriate speech and behavior  Diagnostic and Interventional Summary    EKG Interpretation  Date/Time:  Friday January 13 2019 15:53:20 EST Ventricular Rate:  98 PR Interval:    QRS Duration: 189 QT Interval:  379 QTC Calculation: 479 R Axis:   -99 Text Interpretation:  Sinus rhythm Ventricular premature complex Nonspecific IVCD with LAD Inferior infarct, old Probable anterolateral infarct, age indeterm Baseline wander in lead(s) V2 Confirmed by Kennis Carina [phone]) on 01/13/2019 3:57:58 PM      Labs Reviewed  CBC - Abnormal; Notable for the following components:      Result Value   HCT 47.0 (*)    All other components within normal limits  BASIC METABOLIC PANEL - Abnormal; Notable for the following components:   Potassium 3.2 (*)     CO2 20 (*)    Glucose, Bld 112 (*)    Creatinine, Ser 1.38 (*)    GFR calc non Af Amer 39 (*)    GFR calc Af Amer 46 (*)    All other components within normal limits  BRAIN NATRIURETIC PEPTIDE - Abnormal; Notable for the following components:   B Natriuretic Peptide 1,888.6 (*)    All other components within normal limits    DG Chest 2 View  Final Result      Medications  furosemide (LASIX) injection 80 mg (has no  administration in time range)  albuterol (PROVENTIL) (2.5 MG/3ML) 0.083% nebulizer solution 5 mg (5 mg Nebulization Given 01/13/19 1550)     Procedures Critical Care  ED Course and Medical Decision Making  I have reviewed the triage vital signs and the nursing notes.  Pertinent labs & imaging results that were available during my care of the patient were reviewed by me and considered in my medical decision making (see below for details).  Suspect CHF exacerbation related to intolerance of new diuretic.  Labs and chest x-ray pending.  Labs reveal elevated BNP, chest x-ray with blunting of the costophrenic angles, given 80 mg IV Lasix, admitted to hospital service for further care.  Elmer Sow. Pilar Plate, MD [facility] Emergency Medicine [facility] [email or web address]  Final Clinical Impressions(s) / ED Diagnoses     ICD-10-CM   1. Acute on chronic congestive heart failure, unspecified heart failure type [facility]) I50.9     ED Discharge Orders    None         Sabas Sous, MD 01/13/19 1726

## 2019-01-13 NOTE — ED Notes (Signed)
Report given to ENE, 4w.

## 2019-01-13 NOTE — H&P (Signed)
History and Physical        Hospital Admission Note Date: 01/13/2019  Patient name: Deborah Ho Medical record number: [id number] Date of birth: 01/01/1951 Age: 68 y.o. Gender: female  PCP: [facility]    Patient coming from:   I have reviewed all records in the [facility].    Chief Complaint:  Shortness of breath with abdominal distention worsening in the last week  HPI: Patient is a 68 year old female with history of CAD, chronic systolic CHF with EF 15%, hypertension, hyperlipidemia, GERD, ischemic cardiomyopathy presented to ED with abdominal distention, shortness of breath, peripheral edema.  Patient was admitted from 1/30-2/2 for acute on chronic systolic CHF.  She was discharged on oral Bumex.  Patient did not follow-up with cardiology after the hospital discharge.  She reports that she did fail her new medicine, Bumex however after she takes Bumex she starts having coughing spells and feels drained out.  Then she drinks a bottle of water which helps her cough.  For the past week patient has been having gradually worsening shortness of breath, swelling in her legs and abdominal distention.  She has orthopnea, states that she feels nauseous on lying down.  Denies any chest pain, abdominal pain, fevers or chills or any productive cough.  ED work-up/course: In ED, temp 98.3, respiratory rate 19, heart rate 92, BP 146/92, O2 sats 99% on room air Sodium 142, potassium 3.2, BUN 21, creatinine 1.3 WBC 7.9, hemoglobin 14.2, BNP 1888 Chest x-ray showed stable cardiomegaly with chronic vascular congestion, blunting of the right costophrenic angle suggestive of small right pleural effusion and/or atelectasis.   Review of Systems: Positives marked in 'bold' Constitutional: Denies fever, chills, diaphoresis, poor appetite and fatigue.  HEENT:  Denies photophobia, eye pain, redness, hearing loss, ear pain, congestion, sore throat, rhinorrhea, sneezing, mouth sores, trouble swallowing, neck pain, neck stiffness and tinnitus.   Respiratory: Please see HPI Cardiovascular: Denies chest pain, palpitations, + leg swelling.  Gastrointestinal: Denies nausea, vomiting, abdominal pain, diarrhea, constipation, blood in stool and + abdominal distention.  Genitourinary: Denies dysuria, urgency, frequency, hematuria, flank pain and difficulty urinating.  Musculoskeletal: Denies myalgias, back pain, joint swelling, arthralgias and gait problem.  Skin: Denies pallor, rash and wound.  Neurological: Denies dizziness, seizures, syncope, weakness, light-headedness, numbness and headaches.  Hematological: Denies adenopathy. Easy bruising, personal or family bleeding history  Psychiatric/Behavioral: Denies suicidal ideation, mood changes, confusion, nervousness, sleep disturbance and agitation  Past Medical History: Past Medical History:  Diagnosis Date  . CAD (coronary artery disease) 05/03/14; 05/09/14   a. anterior STEMI with early in-stent thorombosis for missed dose of Brillinta s/p PCI with DESx 2 into LAD (04/2014)  . CHF (congestive heart failure) (HCC)   . GERD (gastroesophageal reflux disease)    Probable  . HTN (hypertension)   . Hyperlipidemia   . Ischemic cardiomyopathy    a. 04/2014 ECHO with EF 45-50% b.  Repeat 2D echo 08/14/14 with EF down at 15%. Life vest placed  . Non compliance w medication regimen   . Tobacco use     Past Surgical History:  Procedure Laterality Date  . CHOLECYSTECTOMY    .  CORONARY ANGIOPLASTY WITH STENT PLACEMENT  05/03/14   STEMI- stent to LAD DES- Xience alpine  . CORONARY ANGIOPLASTY WITH STENT PLACEMENT  05/09/14   STEMI- overlapping stent to LAD, pt had missed a dose of Brilinta  . LEFT HEART CATHETERIZATION WITH CORONARY ANGIOGRAM N/A 05/03/2014   Procedure: LEFT HEART CATHETERIZATION WITH CORONARY  ANGIOGRAM;  Surgeon: Peter M SwazilandJordan, MD;  [facility];  Service: Cardiovascular;  Laterality: N/A;  . LEFT HEART CATHETERIZATION WITH CORONARY ANGIOGRAM N/A 05/09/2014   Procedure: LEFT HEART CATHETERIZATION WITH CORONARY ANGIOGRAM;  Surgeon: Corky CraftsJayadeep S Varanasi, MD;  [facility];  Service: Cardiovascular;  Laterality: N/A;  . PARTIAL HYSTERECTOMY    . PERCUTANEOUS STENT INTERVENTION  05/03/2014   Procedure: PERCUTANEOUS STENT INTERVENTION;  Surgeon: Peter M SwazilandJordan, MD;  [facility];  Service: Cardiovascular;;  DES Prox LAD     Medications: Prior to Admission medications   Medication Sig Start Date End Date Taking? Authorizing Provider  atorvastatin (LIPITOR) 40 MG tablet Take 1 tablet (40 mg total) by mouth daily at 6 PM. 03/18/18  Yes Sharol HarnessSimmons, Brittainy M, PA-C  bumetanide (BUMEX) 2 MG tablet Take 1 tablet (2 mg total) by mouth daily. 12/17/18 12/17/19 Yes Barnetta Chapelgbata, Sylvester I, MD  carvedilol (COREG) 6.25 MG tablet Take 6.25 mg by mouth 2 (two) times daily as needed (high blood pressure).    Yes [provider]  cetirizine (ZYRTEC) 10 MG tablet Take 10 mg daily by mouth.    Yes [provider]  cholecalciferol (VITAMIN D) 1000 units tablet Take 1,000 Units by mouth every 14 (fourteen) days. Twice a month   Yes [provider]  Ferrous Sulfate (IRON PO) Take 1 tablet by mouth 3 (three) times a week. Take on MWF   Yes [provider]  isosorbide mononitrate (IMDUR) 30 MG 24 hr tablet Take 1 tablet (30 mg total) by mouth daily. 12/04/18  Yes Alwyn RenMathews, Elizabeth G, MD  magnesium oxide (MAG-OX) 400 (241.3 Mg) MG tablet Take 400 mg by mouth every 14 (fourteen) days. Twice a month   Yes [provider]  potassium chloride SA (K-DUR,KLOR-CON) 20 MEQ tablet Take 1 tablet (20 mEq total) by mouth daily. 12/04/18  Yes Alwyn RenMathews, Elizabeth G, MD  fluticasone [facility](FLONASE) 50 MCG/ACT nasal spray Place 1 spray into both nostrils daily as needed for  allergies (congestion).  04/04/18   [provider]  metolazone (ZAROXOLYN) 2.5 MG tablet 4 times weekly Patient taking differently: Take 2.5 mg by mouth daily as needed (fluid).  07/11/18   Graciella Freerillery, Michael Andrew, PA-C  nitroGLYCERIN (NITROSTAT) 0.4 MG SL tablet Place 0.4 mg under the tongue every 5 (five) minutes as needed for chest pain.    [provider]  spironolactone (ALDACTONE) 25 MG tablet Take 1 tablet (25 mg total) by mouth daily. Patient not taking: Reported on 01/13/2019 12/02/18   Alwyn RenMathews, Elizabeth G, MD  ticagrelor (BRILINTA) 60 MG TABS tablet Take 1 tablet (60 mg total) by mouth 2 (two) times daily. Patient not taking: Reported on 01/13/2019 04/02/17   Eddie Northhungel, Nishant, MD    Allergies:   Allergies  Allergen Reactions  . Aspirin Swelling    Chewable children's aspirin makes patients tongue and face swell  . Effient [Prasugrel] Swelling    Patient's tongue and face swells  . Entresto [Sacubitril-Valsartan] Other (See Comments)    dizziness  . Lactose Intolerance (Gi) Other (See Comments)    REACTION: stomach upset  . Robitussin Dm [Guaifenesin-Dm] Swelling  Patient's tongue swells  . Sulfa Antibiotics Swelling  . Other     Had to replace "catgut" with clamps    Social History:  reports that she has been smoking cigarettes. She has a 0.05 pack-year smoking history. She uses smokeless tobacco. She reports that she does not drink alcohol or use drugs.  Family History: Family History  Problem Relation Age of Onset  . Diabetes Mother   . Hypertension Mother     Physical Exam: Blood pressure (!) 146/92, pulse 95, temperature 98.3 F (36.8 C), temperature source Oral, resp. rate 16, SpO2 99 %. General: Alert, awake, oriented x3, in no acute distress. Eyes: pink conjunctiva,anicteric sclera, pupils equal and reactive to light and accomodation, HEENT: normocephalic, atraumatic, oropharynx clear Neck: supple, no masses or lymphadenopathy, no goiter,  no bruits  CVS: Regular rate and rhythm, without murmurs, rubs or gallops. 1+ lower extremity edema Resp : Decreased breath sound at the bases bilaterally GI : Soft, nontender, ++distended, positive bowel sounds, no masses. No hepatomegaly. Musculoskeletal: No clubbing or cyanosis, positive pedal pulses. No contracture. ROM intact  Neuro: Grossly intact, no focal neurological deficits, strength 5/5 upper and lower extremities bilaterally Psych: alert and oriented x 3, normal mood and affect Skin: no rashes or lesions, warm and dry   LABS on Admission: I have personally reviewed all the labs and imagings below    Basic Metabolic Panel: Recent Labs  Lab 01/13/19 1603  NA 142  K 3.2*  CL 111  CO2 20*  GLUCOSE 112*  BUN 21  CREATININE 1.38*  CALCIUM 9.1   Liver Function Tests: No results for input(s): AST, ALT, ALKPHOS, BILITOT, PROT, ALBUMIN in the last 168 hours. No results for input(s): LIPASE, AMYLASE in the last 168 hours. No results for input(s): AMMONIA in the last 168 hours. CBC: Recent Labs  Lab 01/13/19 1603  WBC 7.9  HGB 14.2  HCT 47.0*  MCV 95.3  PLT 244   Cardiac Enzymes: No results for input(s): CKTOTAL, CKMB, CKMBINDEX, TROPONINI in the last 168 hours. BNP: Invalid input(s): POCBNP CBG: No results for input(s): GLUCAP in the last 168 hours.  Radiological Exams on Admission:  Dg Chest 2 View  Result Date: 01/13/2019 CLINICAL DATA:  Shortness of breath.  Cough. EXAM: CHEST - 2 VIEW COMPARISON:  11/18/2018 and 03/10/2018 FINDINGS: Stable cardiomegaly. Prominent central vascular markings and prominent interstitial markings are similar to prior examinations. Slightly increased densities at the right lung base could represent atelectasis and small right pleural effusion. No acute bone abnormality. IMPRESSION: 1. Stable cardiomegaly with probable chronic vascular congestion. 2. Blunting at the right costophrenic angle is suggestive for a small right pleural  effusion and/or atelectasis. Electronically Signed   By: Richarda Overlie M.D.   On: 01/13/2019 16:41      EKG: Independently reviewed.  Rate 98, normal sinus rhythm  Assessment/Plan Principal Problem:   Acute on chronic combined systolic and diastolic CHF (congestive heart failure) (HCC) -Likely worsened due to medical noncompliance, dietary indiscretion, drinking a bottle of water after taking Bumex, elevated BNP with abdominal distention, peripheral edema, dyspnea, orthopnea -2D echo 11/28/2018 had shown EF of 15% with diffuse hypokinesis, grade 3 diastolic dysfunction -Placed on telemetry, obtain serial cardiac enzymes -Patient has received Lasix of 80 mg IV x1 in ED, will place on Lasix 40 mg IV every 6 hours - patient has not followed up with cardiology outpatient since discharge from the prior admission.  Consulted cardiology, discussed with Dr. Royann Shivers, cardiology or CHF  team will follow in a.m.   Active Problems:   Ischemic cardiomyopathy with history of CAD, PCI in 2015 -Currently no chest pain, follow troponins, management as #1 -Hold Coreg due to acute decompensation of CHF, continue statin -Allergic to aspirin    Essential hypertension -BP currently soft, continue to hold spironolactone, metolazone, Coreg -For now continue IV Lasix, Imdur    CKD (chronic kidney disease), stage III (HCC) -Creatinine close to baseline, 1.2-1.3  -Monitor closely with IV diuresis    Hyperlipidemia -Continue statin  DVT prophylaxis: Heparin subcu  CODE STATUS: Full CODE STATUS  Consults called: Cardiology  Family Communication: Admission, patients condition and plan of care including tests being ordered have been discussed with the patient who indicates understanding and agree with the plan and Code Status  Admission status:   Disposition plan: Further plan will depend as patient's clinical course evolves and further radiologic and laboratory data become available.    At the time of  admission, it appears that the appropriate admission status for this patient is INPATIENT . This is judged to be reasonable and necessary in order to provide the required intensity of service to ensure the patient's safety given the presenting symptoms of acute CHF decompensation with orthopnea, dyspnea, abdominal distention, peripheral edema initial radiographic and laboratory data in the context of their chronic comorbidities.  The medical decision making on this patient was of high complexity and the patient is at high risk for clinical deterioration, therefore this is a level 3 visit.   Time Spent on Admission: 65 minutes    Demitri Kucinski M.D. [facility] 01/13/2019, 6:00 PM

## 2019-01-13 NOTE — ED Notes (Signed)
Patient transported to X-ray 

## 2019-01-13 NOTE — ED Triage Notes (Signed)
Pt reports worsening edema of her L side, abdomen and her back.  She was switched from Furosmide to Bumetanide almost 4 years ago for CHF.  The swelling is causing her to have diff breathing.  The diff breathing has worsened over a week.  She denies any cp or lightheadedness.  She is A&OX4.

## 2019-01-13 NOTE — ED Notes (Signed)
Per Bero,MD patient may have a coke to drink.

## 2019-01-14 ENCOUNTER — Other Ambulatory Visit: Payer: Self-pay

## 2019-01-14 DIAGNOSIS — I2510 Atherosclerotic heart disease of native coronary artery without angina pectoris: Secondary | ICD-10-CM

## 2019-01-14 DIAGNOSIS — N183 Chronic kidney disease, stage 3 (moderate): Secondary | ICD-10-CM

## 2019-01-14 DIAGNOSIS — I2583 Coronary atherosclerosis due to lipid rich plaque: Secondary | ICD-10-CM

## 2019-01-14 DIAGNOSIS — I10 Essential (primary) hypertension: Secondary | ICD-10-CM

## 2019-01-14 DIAGNOSIS — E7800 Pure hypercholesterolemia, unspecified: Secondary | ICD-10-CM

## 2019-01-14 DIAGNOSIS — I5043 Acute on chronic combined systolic (congestive) and diastolic (congestive) heart failure: Secondary | ICD-10-CM

## 2019-01-14 LAB — BASIC METABOLIC PANEL
Anion gap: 11 (ref 5–15)
BUN: 21 mg/dL (ref 8–23)
CO2: 24 mmol/L (ref 22–32)
Calcium: 8.9 mg/dL (ref 8.9–10.3)
Chloride: 107 mmol/L (ref 98–111)
Creatinine, Ser: 1.42 mg/dL — ABNORMAL HIGH (ref 0.44–1.00)
GFR calc Af Amer: 44 mL/min — ABNORMAL LOW (ref >60)
GFR calc non Af Amer: 38 mL/min — ABNORMAL LOW (ref >60)
Glucose, Bld: 138 mg/dL — ABNORMAL HIGH (ref 70–99)
Potassium: 3.4 mmol/L — ABNORMAL LOW (ref 3.5–5.1)
Sodium: 142 mmol/L (ref 135–145)

## 2019-01-14 LAB — TROPONIN I
Troponin I: <0.03 ng/mL (ref <0.03)
Troponin I: <0.03 ng/mL (ref <0.03)

## 2019-01-14 MED ORDER — CARVEDILOL 6.25 MG PO TABS
6.2500 mg | ORAL_TABLET | Freq: Two times a day (BID) | ORAL | Status: DC
  Administered 2019-01-14: 6.25 mg via ORAL
  Filled 2019-01-14 (×2): qty 1

## 2019-01-14 MED ORDER — FUROSEMIDE 10 MG/ML IJ SOLN
60.0000 mg | Freq: Four times a day (QID) | INTRAMUSCULAR | Status: DC
  Administered 2019-01-14 – 2019-01-15 (×4): 60 mg via INTRAVENOUS
  Filled 2019-01-14 (×4): qty 6

## 2019-01-14 MED ORDER — SPIRONOLACTONE 25 MG PO TABS
25.0000 mg | ORAL_TABLET | Freq: Every day | ORAL | Status: DC
  Administered 2019-01-14 – 2019-01-15 (×2): 25 mg via ORAL
  Filled 2019-01-14 (×2): qty 1

## 2019-01-14 MED ORDER — INFLUENZA VAC SPLIT HIGH-DOSE 0.5 ML IM SUSY
0.5000 mL | PREFILLED_SYRINGE | INTRAMUSCULAR | Status: AC
  Administered 2019-01-16: 0.5 mL via INTRAMUSCULAR
  Filled 2019-01-14: qty 0.5

## 2019-01-14 MED ORDER — POLYETHYLENE GLYCOL 3350 17 G PO PACK
17.0000 g | PACK | Freq: Once | ORAL | Status: AC
  Administered 2019-01-14: 17 g via ORAL

## 2019-01-14 MED ORDER — DIPHENHYDRAMINE-ZINC ACETATE 2-0.1 % EX CREA
TOPICAL_CREAM | Freq: Every day | CUTANEOUS | Status: DC | PRN
  Administered 2019-01-14: 09:00:00 via TOPICAL
  Filled 2019-01-14: qty 28

## 2019-01-14 MED ORDER — POTASSIUM CHLORIDE CRYS ER 20 MEQ PO TBCR
40.0000 meq | EXTENDED_RELEASE_TABLET | Freq: Every day | ORAL | Status: DC
  Administered 2019-01-14 – 2019-01-18 (×5): 40 meq via ORAL
  Filled 2019-01-14 (×5): qty 2

## 2019-01-14 MED ORDER — TICAGRELOR 60 MG PO TABS
60.0000 mg | ORAL_TABLET | Freq: Two times a day (BID) | ORAL | Status: DC
  Administered 2019-01-14 – 2019-01-18 (×9): 60 mg via ORAL
  Filled 2019-01-14 (×9): qty 1

## 2019-01-14 MED ORDER — BENZONATATE 100 MG PO CAPS
100.0000 mg | ORAL_CAPSULE | Freq: Three times a day (TID) | ORAL | Status: DC
  Administered 2019-01-14 – 2019-01-18 (×13): 100 mg via ORAL
  Filled 2019-01-14 (×14): qty 1

## 2019-01-14 MED ORDER — HYDRALAZINE HCL 10 MG PO TABS: 10.0000 mg | ORAL_TABLET | Freq: Three times a day (TID) | ORAL | Status: DC

## 2019-01-14 NOTE — Progress Notes (Signed)
[facility]                                                                              Patient Demographics  Deborah Ho, is a 68 y.o. female, DOB - 07/24/1951, ZOX:[id number]  Admit date - 01/13/2019   Admitting Physician Chukwuemeka Artola Jenna LuoK Chandel Zaun, MD  Outpatient Primary MD for the patient is [facility]  Outpatient specialists:   LOS - 1  days   Medical records reviewed and are as summarized below:    Chief Complaint  Patient presents with  . Shortness of Breath       Brief summary   Patient is a 68 year old female with history of CAD, chronic systolic CHF with EF 15%, hypertension, hyperlipidemia, GERD, ischemic cardiomyopathy presented to ED with abdominal distention, shortness of breath, peripheral edema.  Patient was admitted from 1/30-2/2 for acute on chronic systolic CHF.  She was discharged on oral Bumex.  Patient did not follow-up with cardiology after the hospital discharge.  She reported that after she takes Bumex she starts having coughing spells and feels drained out.  Then she drinks a bottle of water which helps her cough.  For the past week patient has been having gradually worsening shortness of breath, swelling in her legs and abdominal distention.  She has orthopnea, states that she feels nauseous on lying down.     Assessment & Plan    Principal Problem:   Acute on chronic combined systolic and diastolic CHF (congestive heart failure) (HCC) -Likely worsened due to medical noncompliance, dietary indiscretion, drinking a bottle of water after taking Bumex, elevated BNP with abdominal distention, peripheral edema, dyspnea, orthopnea -2D echo 11/28/2018 had shown EF of 15% with diffuse hypokinesis, grade 3 diastolic dysfunction -Troponins negative, patient was placed on IV Lasix, creatinine has trended up to 1.4 -Seen by cardiology today, recommended to continue current dose of Lasix, follow creatinine closely -May benefit  from right heart cath.  Abdominal ultrasound negative for any ascites. -Per cardiology, once on p.o. diuretics, would use torsemide instead of Bumex.  - Restarted on Coreg, continue Imdur, Aldactone Strict I's and O's and daily weights.  Active Problems:   Ischemic cardiomyopathy with history of CAD, PCI in 2015 -Currently no chest pain, follow troponins, management as #1 -Patient reports that she is allergic to aspirin -Per Dr. Mayford Knifeurner, continue Brilinta, has a history of in-stent thrombosis.  Continue beta-blocker, statin    Essential hypertension -BP currently stable, continue Lasix, Aldactone, started on beta-blocker.      CKD (chronic kidney disease), stage III (HCC) -Creatinine close to baseline, 1.2-1.3  -Creatinine trended up to 1.4, monitor closely with IV Lasix, Aldactone    Hyperlipidemia -Continue statin  Code Status: Full CODE STATUS DVT Prophylaxis:   SCD's Family Communication: Discussed in detail with the patient, all imaging results, lab results explained to the patient   Disposition Plan: When cleared by cardiology  Time Spent in minutes   35 minutes  Procedures:  Abdominal ultrasound  Consultants:   Cardiology  Antimicrobials:   Anti-infectives (From admission, onward)   None  Medications  Scheduled Meds: . atorvastatin  40 mg Oral q1800  . benzonatate  100 mg Oral TID  . carvedilol  6.25 mg Oral BID WC  . furosemide  60 mg Intravenous Q6H  . heparin  5,000 Units Subcutaneous Q8H  . [START ON 01/15/2019] Influenza vac split quadrivalent PF  0.5 mL Intramuscular Tomorrow-1000  . isosorbide mononitrate  30 mg Oral Daily  . loratadine  10 mg Oral Daily  . polyethylene glycol  17 g Oral Once  . potassium chloride SA  40 mEq Oral Daily  . sodium chloride flush  3 mL Intravenous Q12H  . spironolactone  25 mg Oral Daily   Continuous Infusions: . sodium chloride     PRN Meds:.sodium chloride, acetaminophen, diphenhydrAMINE-zinc  acetate, fluticasone, ondansetron (ZOFRAN) IV, sodium chloride flush      Subjective:   Deborah Ho was seen and examined today.  States initially felt better however still feels distended.  Shortness of breath is improving.  No chest pain. Patient denies dizziness, abdominal pain, N/V/D/C, new weakness, numbess, tingling. No acute events overnight.    Objective:   Vitals:   01/13/19 2200 01/14/19 0400 01/14/19 0527 01/14/19 1021  BP:   130/90 125/81  Pulse:   92 78  Resp:   18   Temp:      TempSrc:      SpO2:   99%   Weight:  96.4 kg    Height: 5' 5.5" (1.664 m)       Intake/Output Summary (Last 24 hours) at 01/14/2019 1102 Last data filed at 01/14/2019 0500 Gross per 24 hour  Intake 479 ml  Output 2450 ml  Net -1971 ml     Wt Readings from Last 3 Encounters:  01/14/19 96.4 kg  12/18/18 92.5 kg  12/04/18 89.4 kg     Exam  General: Alert and oriented x 3, NAD  Eyes:   HEENT:    Cardiovascular: S1 S2 auscultated,  Regular rate and rhythm.  Respiratory: Clear to auscultation bilaterally, no wheezing, rales or rhonchi  Gastrointestinal: Obese, soft, nontender, nondistended, + bowel sounds  Ext: no pedal edema bilaterally  Neuro: No new deficits  Musculoskeletal: No digital cyanosis, clubbing  Skin: No rashes  Psych: Normal affect and demeanor, alert and oriented x3    Data Reviewed:  I have personally reviewed following labs and imaging studies  Micro Results No results found for this or any previous visit (from the past 240 hour(s)).  Radiology Reports Dg Chest 2 View  Result Date: 01/13/2019 CLINICAL DATA:  Shortness of breath.  Cough. EXAM: CHEST - 2 VIEW COMPARISON:  11/18/2018 and 03/10/2018 FINDINGS: Stable cardiomegaly. Prominent central vascular markings and prominent interstitial markings are similar to prior examinations. Slightly increased densities at the right lung base could represent atelectasis and small right pleural effusion. No  acute bone abnormality. IMPRESSION: 1. Stable cardiomegaly with probable chronic vascular congestion. 2. Blunting at the right costophrenic angle is suggestive for a small right pleural effusion and/or atelectasis. Electronically Signed   By: Richarda Overlie M.D.   On: 01/13/2019 16:41   Dg Chest 2 View  Result Date: 12/15/2018 CLINICAL DATA:  Dyspnea and productive cough for several days. EXAM: CHEST - 2 VIEW COMPARISON:  11/27/2018 FINDINGS: There has been interval clearing of right base consolidation and atelectasis. Stable cardiomegaly with minimal aortic atherosclerosis is redemonstrated. No new pulmonary consolidation nor overt pulmonary edema. No pleural effusion or pneumothorax. No acute osseous abnormality. IMPRESSION: No active cardiopulmonary disease. Stable  cardiomegaly and aortic atherosclerosis. Electronically Signed   By: Tollie Eth M.D.   On: 12/15/2018 22:57   Mr Brain Wo Contrast  Result Date: 12/15/2018 CLINICAL DATA:  Altered mental status EXAM: MRI HEAD WITHOUT CONTRAST TECHNIQUE: Multiplanar, multiecho pulse sequences of the brain and surrounding structures were obtained without intravenous contrast. COMPARISON:  None. FINDINGS: BRAIN: Susceptibility effects from dental hardware partially obscure the frontal lobes on diffusion-weighted imaging. There is no acute infarct, acute hemorrhage, hydrocephalus or extra-axial collection. The midline structures are normal. No midline shift or other mass effect. Early confluent hyperintense T2-weighted signal of the periventricular and deep white matter, most commonly due to chronic ischemic microangiopathy. The cerebral and cerebellar volume are age-appropriate. Susceptibility-sensitive sequences show no chronic microhemorrhage or superficial siderosis. VASCULAR: Major intracranial arterial and venous sinus flow voids are normal. SKULL AND UPPER CERVICAL SPINE: Calvarial bone marrow signal is normal. There is no skull base mass. Visualized upper  cervical spine and soft tissues are normal. SINUSES/ORBITS: No fluid levels or advanced mucosal thickening. No mastoid or middle ear effusion. The orbits are normal. IMPRESSION: Chronic small vessel ischemia without acute intracranial abnormality. Electronically Signed   By: Deatra Robinson M.D.   On: 12/15/2018 22:29   Ct Abdomen Pelvis W Contrast  Result Date: 12/15/2018 CLINICAL DATA:  Abdominal pain, nausea and vomiting EXAM: CT ABDOMEN AND PELVIS WITH CONTRAST TECHNIQUE: Multidetector CT imaging of the abdomen and pelvis was performed using the standard protocol following bolus administration of intravenous contrast. CONTRAST:  ISOVUE-300 IOPAMIDOL (ISOVUE-300) INJECTION 61% COMPARISON:  Abdominal ultrasound for ascites 11/19/2017 FINDINGS: Lower chest: Cardiomegaly with small right effusion and peribronchial thickening to both lower lobes, right greater than left. Subsegmental atelectasis is noted the right middle lobe and both lower lobes. Hepatobiliary: Slight morphologic changes of cirrhosis. No space-occupying mass. Cholecystectomy. Pancreas: Trace edema in the right upper quadrant adjacent to the duodenal sweep. Mild peripancreatic edema is also suggested near the pancreatic head. Peptic ulcer disease, duodenitis or mild pancreatitis might contribute to this appearance. Sympathetic fluid from cirrhosis is also possibility. Spleen: Normal Adrenals/Urinary Tract: Normal bilateral adrenal glands. Symmetric cortical enhancement both kidneys. Simple cyst arising off the upper pole the right kidney is identified measuring 4.2 cm in diameter. A cyst arising off the lower pole of the left kidney is also noted measuring 1.9 cm in diameter without worrisome features. No solid enhancing renal mass. No nephrolithiasis nor hydroureteronephrosis. The urinary bladder is unremarkable for the degree of distention. Stomach/Bowel: Normal appendix. Small hiatal hernia. Physiologic distention of the stomach with  normal duodenal sweep and ligament of Treitz position. There does appear to be some trace fluid and edema emanating from the right upper quadrant off the region of the duodenum and the possibility of peptic ulcer disease accounting for this appearance or mild pancreatitis are some considerations. The colon is decompressed in appearance without acute inflammatory change or obstruction. Vascular/Lymphatic: No significant vascular findings are present. No enlarged abdominal or pelvic lymph nodes. Reproductive: Status post hysterectomy. No adnexal masses. Other: Small amount of free fluid in the pelvis. Soft tissue anasarca. Trace perihepatic ascites. Musculoskeletal: No acute nor suspicious osseous abnormality. T11-12 disc space narrowing. IMPRESSION: 1. Cardiomegaly with presumed inflammatory peribronchial thickening and small right effusion. 2. Morphologic changes of cirrhosis with small volume ascites. 3. There is edema and trace fluid in the right upper quadrant in the region of the duodenal sweep and pancreatic head. Differential considerations might include a lot nidus, pancreatitis or sympathetic fluid from  cirrhosis. 4. Bilateral renal cysts without obstructive uropathy. 5. No evidence of diverticulitis or large bowel obstruction. Electronically Signed   By: Tollie Eth M.D.   On: 12/15/2018 23:18   US Abdomen Limited  Result Date: 01/13/2019 CLINICAL DATA:  68 year old female with abdominal distention. Evaluate for ascites. EXAM: LIMITED ABDOMEN ULTRASOUND FOR ASCITES TECHNIQUE: Limited ultrasound survey for ascites was performed in all four abdominal quadrants. COMPARISON:  CT of the abdomen pelvis dated 12/15/2018 FINDINGS: Targeted evaluation of the 4 quadrants of the abdomen performed. No significant free fluid noted. IMPRESSION: No ascites. Electronically Signed   By: Elgie Collard M.D.   On: 01/13/2019 22:11    Lab Data:  CBC: Recent Labs  Lab 01/13/19 1603 01/13/19 1901  WBC 7.9 8.1    HGB 14.2 14.6  HCT 47.0* 49.6*  MCV 95.3 96.3  PLT 244 269   Basic Metabolic Panel: Recent Labs  Lab 01/13/19 1603 01/13/19 1901 01/14/19 0034  NA 142  --  142  K 3.2*  --  3.4*  CL 111  --  107  CO2 20*  --  24  GLUCOSE 112*  --  138*  BUN 21  --  21  CREATININE 1.38* 1.35* 1.42*  CALCIUM 9.1  --  8.9   GFR: Estimated Creatinine Clearance: 44.6 mL/min (A) (by C-G formula based on SCr of 1.42 mg/dL (H)). Liver Function Tests: No results for input(s): AST, ALT, ALKPHOS, BILITOT, PROT, ALBUMIN in the last 168 hours. No results for input(s): LIPASE, AMYLASE in the last 168 hours. No results for input(s): AMMONIA in the last 168 hours. Coagulation Profile: No results for input(s): INR, PROTIME in the last 168 hours. Cardiac Enzymes: Recent Labs  Lab 01/13/19 1901 01/14/19 0034 01/14/19 0644  TROPONINI <0.03 <0.03 <0.03   BNP (last 3 results) No results for input(s): PROBNP in the last 8760 hours. HbA1C: No results for input(s): HGBA1C in the last 72 hours. CBG: No results for input(s): GLUCAP in the last 168 hours. Lipid Profile: No results for input(s): CHOL, HDL, LDLCALC, TRIG, CHOLHDL, LDLDIRECT in the last 72 hours. Thyroid Function Tests: No results for input(s): TSH, T4TOTAL, FREET4, T3FREE, THYROIDAB in the last 72 hours. Anemia Panel: No results for input(s): VITAMINB12, FOLATE, FERRITIN, TIBC, IRON, RETICCTPCT in the last 72 hours. Urine analysis:    Component Value Date/Time   COLORURINE YELLOW 12/16/2018 0024   APPEARANCEUR CLEAR 12/16/2018 0024   LABSPEC 1.034 (H) 12/16/2018 0024   PHURINE 5.0 12/16/2018 0024   GLUCOSEU NEGATIVE 12/16/2018 0024   HGBUR NEGATIVE 12/16/2018 0024   BILIRUBINUR NEGATIVE 12/16/2018 0024   KETONESUR NEGATIVE 12/16/2018 0024   PROTEINUR 100 (A) 12/16/2018 0024   UROBILINOGEN 1.0 07/25/2015 1601   NITRITE NEGATIVE 12/16/2018 0024   LEUKOCYTESUR NEGATIVE 12/16/2018 0024     Rebie Peale M.D. [facility] 01/14/2019, 11:02 AM  Pager: [phone] Between 7am to 7pm - call Pager - [phone]  After 7pm go to [email or web address] - password [email or web address]  Call night coverage person covering after 7pm

## 2019-01-14 NOTE — Consult Note (Addendum)
Admit date: 01/13/2019 Referring Physician  Dr. Isidoro Donning Primary Physician  [facility] Primary Cardiologist  Dr. Oswaldo Conroy CHF Reason for Consultation  CHF  HPI: Deborah Ho is a 68 y.o. female who is being seen today for the evaluation of CHF at the request of Dr. Isidoro Donning.  This is a 68yo female with a history of HTN, HLD, tobacco use, CAD with hx Ant STEMI with early stent thrombosis for missing doses of Brilinta and had PCI X 2 to LAD 2015, chronic systolic HF with ICM at one point 10-15%- she was to move to [facility] but has not done so, who is being seen today for the evaluation of CHF.    Presented to the ER with abdominal distention, SOB and LE edema.  Was admitted with CHF 1/30-2/2 but failed to followup with Cardiology after discharge and was sent home on Bumex.  She got her Bumex filled but says that it makes her cough and then she feels very fatigued and goes and drinks a bottle of water to help her stop coughing.  Over the past week has had worsening SOB, LE edema, increased abdominal fullness, nausea and orthopnea.  No CP, Fever, chills.    In ER BP 146/22mmHg and O2 sats 99% on RA, Na 142, K+ 3.2, creatinine 1.3 and BNP 1888.  Cxray with CM and chronic vascular congestion and small R pleural effusion.  She was started on IV Lasix and Cardiology is now asked to consult.   She has been followed by AHF clinic but has not been seen in a year and at that time was on Torsemide 80mg  BID and [name].  She has refused Toprol in the past because is dropped her BP and made her feel bad.  She has been on Carvedilol and seems to tolerate it.     PMH:   Past Medical History:  Diagnosis Date  . CAD (coronary artery disease) 05/03/14; 05/09/14   a. anterior STEMI with early in-stent thorombosis for missed dose of Brillinta s/p PCI with DESx 2 into LAD (04/2014)  . CHF (congestive heart failure) (HCC)   . GERD (gastroesophageal reflux disease)    Probable  . HTN  (hypertension)   . Hyperlipidemia   . Ischemic cardiomyopathy    a. 04/2014 ECHO with EF 45-50% b.  Repeat 2D echo 08/14/14 with EF down at 15%. Life vest placed  . Non compliance w medication regimen   . Tobacco use      PSH:   Past Surgical History:  Procedure Laterality Date  . CHOLECYSTECTOMY    . CORONARY ANGIOPLASTY WITH STENT PLACEMENT  05/03/14   STEMI- stent to LAD DES- Xience alpine  . CORONARY ANGIOPLASTY WITH STENT PLACEMENT  05/09/14   STEMI- overlapping stent to LAD, pt had missed a dose of Brilinta  . LEFT HEART CATHETERIZATION WITH CORONARY ANGIOGRAM N/A 05/03/2014   Procedure: LEFT HEART CATHETERIZATION WITH CORONARY ANGIOGRAM;  Surgeon: Peter M Swaziland, MD;  Location: [facility] CATH LAB;  Service: Cardiovascular;  Laterality: N/A;  . LEFT HEART CATHETERIZATION WITH CORONARY ANGIOGRAM N/A 05/09/2014   Procedure: LEFT HEART CATHETERIZATION WITH CORONARY ANGIOGRAM;  Surgeon: Corky Crafts, MD;  Location: [facility] CATH LAB;  Service: Cardiovascular;  Laterality: N/A;  . PARTIAL HYSTERECTOMY    . PERCUTANEOUS STENT INTERVENTION  05/03/2014   Procedure: PERCUTANEOUS STENT INTERVENTION;  Surgeon: Peter M Swaziland, MD;  Location: [facility] CATH LAB;  Service: Cardiovascular;;  DES Prox LAD     Allergies:  Aspirin; Effient [prasugrel]; Entresto [sacubitril-valsartan]; Lactose intolerance (gi); Robitussin dm [guaifenesin-dm]; Sulfa antibiotics; and Other Prior to Admit Meds:   Medications Prior to Admission  Medication Sig Dispense Refill Last Dose  . atorvastatin (LIPITOR) 40 MG tablet Take 1 tablet (40 mg total) by mouth daily at 6 PM. 30 tablet 5 Past Week at Unknown time  . bumetanide (BUMEX) 2 MG tablet Take 1 tablet (2 mg total) by mouth daily. 30 tablet 11 01/12/2019 at Unknown time  . carvedilol (COREG) 6.25 MG tablet Take 6.25 mg by mouth 2 (two) times daily as needed (high blood pressure).    Past Month at Unknown time  . cetirizine (ZYRTEC) 10 MG tablet Take 10 mg daily by mouth.     01/13/2019 at Unknown time  . cholecalciferol (VITAMIN D) 1000 units tablet Take 1,000 Units by mouth every 14 (fourteen) days. Twice a month   Past Month at Unknown time  . Ferrous Sulfate (IRON PO) Take 1 tablet by mouth 3 (three) times a week. Take on MWF   Past Week at Unknown time  . isosorbide mononitrate (IMDUR) 30 MG 24 hr tablet Take 1 tablet (30 mg total) by mouth daily. 30 tablet 1 01/12/2019 at Unknown time  . magnesium oxide (MAG-OX) 400 (241.3 Mg) MG tablet Take 400 mg by mouth every 14 (fourteen) days. Twice a month   Past Month at Unknown time  . potassium chloride SA (K-DUR,KLOR-CON) 20 MEQ tablet Take 1 tablet (20 mEq total) by mouth daily. 30 tablet 0 Past Week at Unknown time  . fluticasone (FLONASE) 50 MCG/ACT nasal spray Place 1 spray into both nostrils daily as needed for allergies (congestion).    unknown  . metolazone (ZAROXOLYN) 2.5 MG tablet 4 times weekly (Patient taking differently: Take 2.5 mg by mouth daily as needed (fluid). ) 20 tablet 11 unknown  . nitroGLYCERIN (NITROSTAT) 0.4 MG SL tablet Place 0.4 mg under the tongue every 5 (five) minutes as needed for chest pain.   unknown  . spironolactone (ALDACTONE) 25 MG tablet Take 1 tablet (25 mg total) by mouth daily. (Patient not taking: Reported on 01/13/2019) 30 tablet 0 Completed Course at Unknown time  . ticagrelor (BRILINTA) 60 MG TABS tablet Take 1 tablet (60 mg total) by mouth 2 (two) times daily. (Patient not taking: Reported on 01/13/2019) 60 tablet 0 Completed Course at Unknown time   Fam HX:    Family History  Problem Relation Age of Onset  . Diabetes Mother   . Hypertension Mother    Social HX:    Social History   Socioeconomic History  . Marital status: Widowed    Spouse name: Not on file  . Number of children: Not on file  . Years of education: Not on file  . Highest education level: Not on file  Occupational History  . Occupation: not employed  Engineer, productionocial Needs  . Financial resource strain: Not on  file  . Food insecurity:    Worry: Not on file    Inability: Not on file  . Transportation needs:    Medical: Not on file    Non-medical: Not on file  Tobacco Use  . Smoking status: Current Some Day Smoker    Packs/day: 0.10    Years: 0.50    Pack years: 0.05    Types: Cigarettes  . Smokeless tobacco: Current User  . Tobacco comment: down to 3 cigarettes daily (08/03/14)  Substance and Sexual Activity  . Alcohol use: No  . Drug use:  No  . Sexual activity: Not on file  Lifestyle  . Physical activity:    Days per week: Not on file    Minutes per session: Not on file  . Stress: Not on file  Relationships  . Social connections:    Talks on phone: Not on file    Gets together: Not on file    Attends religious service: Not on file    Active member of club or organization: Not on file    Attends meetings of clubs or organizations: Not on file    Relationship status: Not on file  . Intimate partner violence:    Fear of current or ex partner: Not on file    Emotionally abused: Not on file    Physically abused: Not on file    Forced sexual activity: Not on file  Other Topics Concern  . Not on file  Social History Narrative   Patient has 6 brothers and sisters and none have known coronary artery disease. She lives alone in [place], but has several brothers in the area.     ROS:  All  ROS were addressed and are negative except what is stated in the HPI  Physical Exam: Blood pressure 130/90, pulse 92, temperature (!) 97.4 F (36.3 C), temperature source Oral, resp. rate 18, height 5' 5.5" (1.664 m), weight 96.4 kg, SpO2 99 %.    General: Well developed, well nourished, in no acute distress Head: Eyes PERRLA, No xanthomas.   Normal cephalic and atramatic  Lungs:   Clear bilaterally to auscultation and percussion. Heart:   HRRR S1 S2 Pulses are 2+ & equal.            No carotid bruit. No JVD.  No abdominal bruits. No femoral bruits. Abdomen: Bowel sounds are positive, abdomen  soft and non-tender without masses or                  Hernia's noted. Msk:  Back normal, normal gait. Normal strength and tone for age. Extremities:   No clubbing, cyanosis or edema.  DP +1 Neuro: Alert and oriented X 3. Psych:  Good affect, responds appropriately    Labs:   Lab Results  Component Value Date   WBC 8.1 01/13/2019   HGB 14.6 01/13/2019   HCT 49.6 (H) 01/13/2019   MCV 96.3 01/13/2019   PLT 269 01/13/2019    Recent Labs  Lab 01/14/19 0034  NA 142  K 3.4*  CL 107  CO2 24  BUN 21  CREATININE 1.42*  CALCIUM 8.9  GLUCOSE 138*   No results found for: PTT Lab Results  Component Value Date   INR 0.93 05/09/2014   INR 0.89 05/03/2014   Lab Results  Component Value Date   TROPONINI <0.03 01/14/2019     Lab Results  Component Value Date   CHOL 126 03/16/2018   CHOL 129 09/29/2017   CHOL 227 (H) 03/03/2016   Lab Results  Component Value Date   HDL 31 (L) 03/16/2018   HDL 34 (L) 09/29/2017   HDL 56 03/03/2016   Lab Results  Component Value Date   LDLCALC 81 03/16/2018   LDLCALC 82 09/29/2017   LDLCALC 142 (H) 03/03/2016   Lab Results  Component Value Date   TRIG 70 03/16/2018   TRIG 64 09/29/2017   TRIG 146 03/03/2016   Lab Results  Component Value Date   CHOLHDL 4.1 03/16/2018   CHOLHDL 3.8 09/29/2017   CHOLHDL 4.1 03/03/2016  No results found for: LDLDIRECT    Radiology:  Dg Chest 2 View  Result Date: 01/13/2019 CLINICAL DATA:  Shortness of breath.  Cough. EXAM: CHEST - 2 VIEW COMPARISON:  11/18/2018 and 03/10/2018 FINDINGS: Stable cardiomegaly. Prominent central vascular markings and prominent interstitial markings are similar to prior examinations. Slightly increased densities at the right lung base could represent atelectasis and small right pleural effusion. No acute bone abnormality. IMPRESSION: 1. Stable cardiomegaly with probable chronic vascular congestion. 2. Blunting at the right costophrenic angle is suggestive for a small  right pleural effusion and/or atelectasis. Electronically Signed   By: Richarda Overlie M.D.   On: 01/13/2019 16:41   US Abdomen Limited  Result Date: 01/13/2019 CLINICAL DATA:  68 year old female with abdominal distention. Evaluate for ascites. EXAM: LIMITED ABDOMEN ULTRASOUND FOR ASCITES TECHNIQUE: Limited ultrasound survey for ascites was performed in all four abdominal quadrants. COMPARISON:  CT of the abdomen pelvis dated 12/15/2018 FINDINGS: Targeted evaluation of the 4 quadrants of the abdomen performed. No significant free fluid noted. IMPRESSION: No ascites. Electronically Signed   By: Elgie Collard M.D.   On: 01/13/2019 22:11     Telemetry    NSR - Personally Reviewed  ECG    NSR with inferior and anterior infarct no acute ST changes - Personally Reviewed   ASSESSMENT///PLAN:   1.  Acute combined systolic/diastolic CHF -echo 11/2018 showed severe LV dysfunction with EF 15% and G3DD and RV dysfunction -weight up  -She had been on Torsemide 80mg  BID a year ago when seen in AHF clinic. Now on Bumex -She has refused BB due to it making her feel bad with low BP but recently seems to have tolerated carvedilol.   -also refused ICD -she has put out 2.5L and is net neg 1.97L since admit -creatinine bumped from 1.38-1.42 but appears volume overloaded -continue Lasix 60mg  IV q6 hours.   -once on PO diuretics would use Torsemide instead of Bumex -restart home dose of Carvedilol 6.25mg  BID (HR in the 90's), and Spironolactone 25mg  daily -She had dizziness with Entresto in the past has refused to try again -unclear why she stopped Losartan -had been on BiDIL but she stopped it and was not restarted at last hospital admit (deferred to Cards outpt) -restart Hydralazine 10mg  TID if BP remains stable with readdition of carvedilol and spiro (restarting BB first due to HR in 90's) -continue Imdur 30mg  daily -need to reinforce fluid restriction to <2L daily and Na restriction < 2000mg  daily  2.   Moderate pulmonary HTN -moderate by echo 11/2018 with PASP - this is significantly higher than a year ago on echo at -RV moderate dilated with mild RV dysfunction on echo as well -continue IV diuretics -may benefit from Right heart cath  3.  Moderate MR -likely related to annular dilatation from severe LV dysfunction -continue HF meds  4.  HTN -BP controlled on current meds.   -restart Carvedilol   5.  CKD stage 3 -creatinine stable with diuresis -continue to follow  6.  ASCAD -s/p large AWMI -early stent thrombosis for missing doses of Brilinta and had PCI X 2 to LAD 2015,  -denies any CP -continue statin/Brilinta and BB -need to address why she is not on ASA at home - if no clear reason why she cannot be on ASA then start 81mg  daily  Armanda Magic, MD  01/14/2019  8:25 AM

## 2019-01-14 NOTE — Progress Notes (Signed)
Pt refusing her evening dose of coreg.  States she does not want it to drop her blood pressure and make her dizzy.  BP is 113/69. MD made aware.

## 2019-01-15 DIAGNOSIS — I5023 Acute on chronic systolic (congestive) heart failure: Secondary | ICD-10-CM

## 2019-01-15 DIAGNOSIS — N170 Acute kidney failure with tubular necrosis: Secondary | ICD-10-CM

## 2019-01-15 DIAGNOSIS — I255 Ischemic cardiomyopathy: Secondary | ICD-10-CM

## 2019-01-15 LAB — BASIC METABOLIC PANEL
Anion gap: 9 (ref 5–15)
BUN: 34 mg/dL — ABNORMAL HIGH (ref 8–23)
CO2: 26 mmol/L (ref 22–32)
Calcium: 8.9 mg/dL (ref 8.9–10.3)
Chloride: 105 mmol/L (ref 98–111)
Creatinine, Ser: 1.82 mg/dL — ABNORMAL HIGH (ref 0.44–1.00)
GFR calc Af Amer: 33 mL/min — ABNORMAL LOW (ref >60)
GFR calc non Af Amer: 28 mL/min — ABNORMAL LOW (ref >60)
Glucose, Bld: 99 mg/dL (ref 70–99)
Potassium: 3.7 mmol/L (ref 3.5–5.1)
Sodium: 140 mmol/L (ref 135–145)

## 2019-01-15 MED ORDER — ASPIRIN EC 81 MG PO TBEC: 81.0000 mg | DELAYED_RELEASE_TABLET | Freq: Every day | ORAL | Status: DC

## 2019-01-15 MED ORDER — CARVEDILOL 3.125 MG PO TABS
3.1250 mg | ORAL_TABLET | Freq: Two times a day (BID) | ORAL | Status: DC
  Administered 2019-01-15 – 2019-01-18 (×7): 3.125 mg via ORAL
  Filled 2019-01-15 (×7): qty 1

## 2019-01-15 MED ORDER — FUROSEMIDE 10 MG/ML IJ SOLN: 40.0000 mg | Freq: Three times a day (TID) | INTRAMUSCULAR | Status: DC

## 2019-01-15 NOTE — Progress Notes (Addendum)
Progress Note  Patient Name: Deborah Ho Date of Encounter: 01/15/2019  Primary Cardiologist: Peter Swaziland, MD   Subjective   She is doing well this morning and denies any chest pain.  Shortness of breath improved.  She refused her carvedilol last night because her systolic blood pressure was 113 mmHg.  She says she is not taking any medicines that make her blood pressure drop.  Inpatient Medications    Scheduled Meds: . atorvastatin  40 mg Oral q1800  . benzonatate  100 mg Oral TID  . carvedilol  3.125 mg Oral BID WC  . heparin  5,000 Units Subcutaneous Q8H  . Influenza vac split quadrivalent PF  0.5 mL Intramuscular Tomorrow-1000  . isosorbide mononitrate  30 mg Oral Daily  . loratadine  10 mg Oral Daily  . potassium chloride SA  40 mEq Oral Daily  . sodium chloride flush  3 mL Intravenous Q12H  . spironolactone  25 mg Oral Daily  . ticagrelor  60 mg Oral BID   Continuous Infusions: . sodium chloride     PRN Meds: sodium chloride, acetaminophen, diphenhydrAMINE-zinc acetate, fluticasone, ondansetron (ZOFRAN) IV, sodium chloride flush   Vital Signs    Vitals:   01/14/19 1816 01/14/19 2028 01/15/19 0508 01/15/19 0509  BP: 113/69 111/64 110/67   Pulse:  74 87   Resp:  18 18   Temp:  97.7 F (36.5 C) 97.9 F (36.6 C)   TempSrc:   Oral   SpO2:  98% 92%   Weight:    97.1 kg  Height:        Intake/Output Summary (Last 24 hours) at 01/15/2019 1000 Last data filed at 01/15/2019 0606 Gross per 24 hour  Intake 582 ml  Output 2150 ml  Net -1568 ml   Filed Weights   01/13/19 2100 01/14/19 0400 01/15/19 0509  Weight: 97.3 kg 96.4 kg 97.1 kg    Telemetry    NSR - Personally Reviewed  ECG    No new EKg to review - Personally Reviewed  Physical Exam   GEN: No acute distress.   Neck: No JVD Cardiac: RRR, no murmurs, rubs, or gallops.  Respiratory:  Scattered wheezes in the left lung GI: Soft, nontender, non-distended  MS: No edema; No deformity. Neuro:   Nonfocal  Psych: Normal affect   Labs    Chemistry Recent Labs  Lab 01/13/19 1603 01/13/19 1901 01/14/19 0034 01/15/19 0450  NA 142  --  142 140  K 3.2*  --  3.4* 3.7  CL 111  --  107 105  CO2 20*  --  24 26  GLUCOSE 112*  --  138* 99  BUN 21  --  21 34*  CREATININE 1.38* 1.35* 1.42* 1.82*  CALCIUM 9.1  --  8.9 8.9  GFRNONAA 39* 41* 38* 28*  GFRAA 46* 47* 44* 33*  ANIONGAP 11  --  11 9     Hematology Recent Labs  Lab 01/13/19 1603 01/13/19 1901  WBC 7.9 8.1  RBC 4.93 5.15*  HGB 14.2 14.6  HCT 47.0* 49.6*  MCV 95.3 96.3  MCH 28.8 28.3  MCHC 30.2 29.4*  RDW 14.8 14.7  PLT 244 269    Cardiac Enzymes Recent Labs  Lab 01/13/19 1901 01/14/19 0034 01/14/19 0644  TROPONINI <0.03 <0.03 <0.03   No results for input(s): TROPIPOC in the last 168 hours.   BNP Recent Labs  Lab 01/13/19 1603  BNP 1,888.6*     DDimer No results for input(s):  DDIMER in the last 168 hours.   Radiology    Dg Chest 2 View  Result Date: 01/13/2019 CLINICAL DATA:  Shortness of breath.  Cough. EXAM: CHEST - 2 VIEW COMPARISON:  11/18/2018 and 03/10/2018 FINDINGS: Stable cardiomegaly. Prominent central vascular markings and prominent interstitial markings are similar to prior examinations. Slightly increased densities at the right lung base could represent atelectasis and small right pleural effusion. No acute bone abnormality. IMPRESSION: 1. Stable cardiomegaly with probable chronic vascular congestion. 2. Blunting at the right costophrenic angle is suggestive for a small right pleural effusion and/or atelectasis. Electronically Signed   By: Richarda Overlie M.D.   On: 01/13/2019 16:41   US Abdomen Limited  Result Date: 01/13/2019 CLINICAL DATA:  68 year old female with abdominal distention. Evaluate for ascites. EXAM: LIMITED ABDOMEN ULTRASOUND FOR ASCITES TECHNIQUE: Limited ultrasound survey for ascites was performed in all four abdominal quadrants. COMPARISON:  CT of the abdomen pelvis dated  12/15/2018 FINDINGS: Targeted evaluation of the 4 quadrants of the abdomen performed. No significant free fluid noted. IMPRESSION: No ascites. Electronically Signed   By: Elgie Collard M.D.   On: 01/13/2019 22:11    Cardiac Studies   2D echo 11/2018 Study Conclusions  - Left ventricle: The cavity size was normal. Wall thickness was   increased in a pattern of mild LVH. The estimated ejection   fraction was 15%. Diffuse hypokinesis. No mural thrombus by   Definity contrast. Doppler parameters are consistent with   restrictive left ventricular relaxation (grade 3 diastolic   dysfunction). The E/e&' ratio is >20, suggesting markedly elevated   LV filling pressure. - Mitral valve: Mildly thickened leaflets . There was moderate   regurgitation. - Left atrium: The atrium was normal in size. - Right ventricle: The cavity size was moderately dilated. Mild   systolic dysfunction. - Right atrium: Severely dilated. - Tricuspid valve: There was moderate regurgitation. - Pulmonary arteries: PA peak pressure: 53 mm Hg (S). - Inferior vena cava: The vessel was dilated. The respirophasic   diameter changes were blunted (< 50%), consistent with elevated   central venous pressure.  Patient Profile     68 y.o. female with a history ofHTN, HLD, tobacco use, CAD with hx Ant STEMI with early stent thrombosis for missing doses of Brilinta and had PCI X 2 to LAD 2015, chronic systolic HF with ICM at one point 10-15%- [place],who is being seen for the evaluation ofCHF.    Assessment & Plan    1.  Acute combined systolic/diastolic CHF -echo 11/2018 showed severe LV dysfunction with EF 15% and G3DD and RV dysfunction -No significant change in weight since admission -She had been on Torsemide 80mg  BID a year ago when seen in AHF clinic but stopped this because she thought that it caused her to have mucus in her chest..  Then placed on Bumex but said she did not  like taking it because it dried up her throat and she have to drink more water. -She has refused BB due to it making her feel bad with low BP but recently seems to have tolerated carvedilol.    Now refusing carvedilol in the hospital because she does not like her blood pressure getting low.  Her systolic blood pressure was 113 mmHg at the time her carvedilol dose was due and she refused to take it last night. -also refused ICD -she put out 2.1 L yesterday and is net -3.3 L -creatinine bumped from 1.38-1.42  yesterday and now is 1.82. -Stop diuretics for now and allow her to "equilibrate. -She is adamant that she does not want to take torsemide or Bumex because she says that they both cause her lungs to develop mucus that she cannot cough up and she gets a dry mouth and throat with them as well -Since creatinine has bumped we will hold on diuretics but long-term use of diuretics will need to be discussed with her as she states she has intolerance to all diuretics now. -I explained to her the importance of taking her carvedilol and that a systolic blood pressure of 113 mmHg does not preclude use of beta-blockers.  I explained to her at length the need for her to take the carvedilol to help improve her heart function and for treatment of her heart failure.  She is now agreeable to going back on it.  -Restart her vital at 3.125 mg twice daily and titrate as blood pressure tolerates.   -Hold Spironolactone due to bump in creatinine -She had dizziness with Entresto in the past has refused to try again -Stop losartan because she said her blood pressure was too low -had been on BiDIL but she stopped it and was not restarted at last hospital admit (deferred to Cards outpt) -She is very resistant to starting on any other heart failure meds because she is worried about her blood pressure-continue Imdur 30mg  daily -need to reinforce fluid restriction to <2L daily and Na restriction < 2000mg  daily  2.  Moderate  pulmonary HTN -moderate by echo 11/2018 with PASP - this is significantly higher than a year ago on echo at -RV moderate dilated with mild RV dysfunction on echo as well -Diuretics on hold due to bump in creatinine -may benefit from Right heart cath some point -needs to get back into advanced heart failure clinic  3.  Moderate MR -likely related to annular dilatation from severe LV dysfunction -continue HF meds  4.  HTN -BP controlled on current meds.   -Restarting carvedilol  5.  CKD stage 3 -creatinine bumped today due to diuresis -continue to follow  6.  ASCAD -s/p large AWMI -early stent thrombosis for missing doses of Brilinta and had PCI X 2 to LAD 2015,  -denies any CP -continue statin/Brilinta and BB -need to address why she is not on ASA at home - if no clear reason why she cannot be on ASA then start 81mg  daily    I have spent a total of 35 minutes with patient reviewing 2D echo , telemetry, EKGs, labs and examining patient as well as establishing an assessment and plan that was discussed with the patient.  > 50% of time was spent in direct patient care.    For questions or updates, please contact [facility] Please consult [email or web address] for contact info under Cardiology/STEMI.      Signed, Armanda Magic, MD  01/15/2019, 10:00 AM

## 2019-01-15 NOTE — Progress Notes (Signed)
[facility]                                                                              Patient Demographics  Deborah Ho, is a 68 y.o. female, DOB - [birth year], WGY:[id number]  Admit date - 01/13/2019   Admitting Physician Ripudeep Jenna Luo, MD  Outpatient Primary MD for the patient is [facility]  Outpatient specialists:   LOS - 2  days   Medical records reviewed and are as summarized below:    Chief Complaint  Patient presents with  . Shortness of Breath       Brief summary   Patient is a 68 year old female with history of CAD, chronic systolic CHF with EF 15%, hypertension, hyperlipidemia, GERD, ischemic cardiomyopathy presented to ED with abdominal distention, shortness of breath, peripheral edema.  Patient was admitted from 1/30-2/2 for acute on chronic systolic CHF.  She was discharged on oral Bumex.  Patient did not follow-up with cardiology after the hospital discharge.  She reported that after she takes Bumex she starts having coughing spells and feels drained out.  Then she drinks a bottle of water which helps her cough.  For the past week patient has been having gradually worsening shortness of breath, swelling in her legs and abdominal distention.  She has orthopnea, states that she feels nauseous on lying down.     Assessment & Plan    Principal Problem:   Acute on chronic combined systolic and diastolic CHF (congestive heart failure) (HCC) -Likely worsened due to medical noncompliance, dietary indiscretion, drinking a bottle of water after taking Bumex, elevated BNP with abdominal distention, peripheral edema, dyspnea, orthopnea -2D echo 11/28/2018 had shown EF of 15% with diffuse hypokinesis, grade 3 diastolic dysfunction -Patient was placed on IV Lasix for diuresis, creatinine has trended up to 1.6 now, cardiology following, diuretics now held.  Spironolactone held.  -Difficult situation, patient reports that she has  not tolerated Bumex or torsemide in the past.  Refused Coreg due to concern for hypotension. -Restarted Coreg at lower dose 3.125 mg twice a day. -Per patient she had dizziness with Entresto in the past, stopped losartan due to hypotension, was stopped on BiDil during previous admission until follow-up with cardiology outpatient however patient did not follow. -Patient will benefit from advanced heart failure therapies, will await CHF team recommendations Abdominal ultrasound did not show any ascites.   Active Problems:   Ischemic cardiomyopathy with history of CAD, PCI in 2015 -Currently no chest pain, follow troponins, management as #1 -Patient reports that she is allergic to aspirin.  Discussed with patient, she states aspirin causes her "mouth to swell " ?  Anaphylaxis  -Per Dr. Mayford Knife, continue Brilinta, has a history of in-stent thrombosis.  Continue beta-blocker, statin    Essential hypertension -BP currently stable, continue Imdur, low-dose beta-blocker, Lasix on hold    CKD (chronic kidney disease), stage III (HCC) -Creatinine close to baseline, 1.2-1.3  -Creatinine trended up to 1.4, monitor closely with IV Lasix, Aldactone    Hyperlipidemia -Continue statin  Moderate pulmonary hypertension Diuretics currently on hold, 2D echo 11/2018 with PA pressure 53 mmHg,  right ventricular moderately dilated with mild RV dysfunction.  Code Status: Full CODE STATUS DVT Prophylaxis:   SCD's Family Communication: Discussed in detail with the patient, all imaging results, lab results explained to the patient   Disposition Plan: When cleared by cardiology  Time Spent in minutes   35 minutes  Procedures:  Abdominal ultrasound  Consultants:   Cardiology  Antimicrobials:   Anti-infectives (From admission, onward)   None         Medications  Scheduled Meds: . atorvastatin  40 mg Oral q1800  . benzonatate  100 mg Oral TID  . carvedilol  3.125 mg Oral BID WC  . heparin   5,000 Units Subcutaneous Q8H  . Influenza vac split quadrivalent PF  0.5 mL Intramuscular Tomorrow-1000  . isosorbide mononitrate  30 mg Oral Daily  . loratadine  10 mg Oral Daily  . potassium chloride SA  40 mEq Oral Daily  . sodium chloride flush  3 mL Intravenous Q12H  . ticagrelor  60 mg Oral BID   Continuous Infusions: . sodium chloride     PRN Meds:.sodium chloride, acetaminophen, diphenhydrAMINE-zinc acetate, fluticasone, ondansetron (ZOFRAN) IV, sodium chloride flush      Subjective:   Ebelyn Devivo was seen and examined today.  Upset that her weight has not budged since she has been in the hospital despite IV Lasix diuresis.  Ambulating in the room without any difficulty.  No chest pain, no worsening shortness of breath.   Patient denies dizziness, abdominal pain, N/V/D/C, new weakness, numbess, tingling.  Overnight refused Coreg.  Objective:   Vitals:   01/14/19 1816 01/14/19 2028 01/15/19 0508 01/15/19 0509  BP: 113/69 111/64 110/67   Pulse:  74 87   Resp:  18 18   Temp:  97.7 F (36.5 C) 97.9 F (36.6 C)   TempSrc:   Oral   SpO2:  98% 92%   Weight:    97.1 kg  Height:        Intake/Output Summary (Last 24 hours) at 01/15/2019 1052 Last data filed at 01/15/2019 1030 Gross per 24 hour  Intake 582 ml  Output 2450 ml  Net -1868 ml     Wt Readings from Last 3 Encounters:  01/15/19 97.1 kg  12/18/18 92.5 kg  12/04/18 89.4 kg   Physical Exam  General: Alert and oriented x 3, NAD  Eyes:   HEENT:  Atraumatic, normocephalic  Cardiovascular: S1 S2 clear, no murmurs, RRR. 1+ pedal edema b/l  Respiratory: Decreased breath sound at the bases  Gastrointestinal: Soft, nontender, nondistended, NBS  Ext: no pedal edema bilaterally  Neuro: no new deficits  Musculoskeletal: No cyanosis, clubbing  Skin: No rashes  Psych: Normal affect and demeanor, alert and oriented x3    Data Reviewed:  I have personally reviewed following labs and imaging  studies  Micro Results No results found for this or any previous visit (from the past 240 hour(s)).  Radiology Reports Dg Chest 2 View  Result Date: 01/13/2019 CLINICAL DATA:  Shortness of breath.  Cough. EXAM: CHEST - 2 VIEW COMPARISON:  11/18/2018 and 03/10/2018 FINDINGS: Stable cardiomegaly. Prominent central vascular markings and prominent interstitial markings are similar to prior examinations. Slightly increased densities at the right lung base could represent atelectasis and small right pleural effusion. No acute bone abnormality. IMPRESSION: 1. Stable cardiomegaly with probable chronic vascular congestion. 2. Blunting at the right costophrenic angle is suggestive for a small right pleural effusion and/or atelectasis. Electronically Signed   By: Richarda Overlie  M.D.   On: 01/13/2019 16:41   US Abdomen Limited  Result Date: 01/13/2019 CLINICAL DATA:  68 year old female with abdominal distention. Evaluate for ascites. EXAM: LIMITED ABDOMEN ULTRASOUND FOR ASCITES TECHNIQUE: Limited ultrasound survey for ascites was performed in all four abdominal quadrants. COMPARISON:  CT of the abdomen pelvis dated 12/15/2018 FINDINGS: Targeted evaluation of the 4 quadrants of the abdomen performed. No significant free fluid noted. IMPRESSION: No ascites. Electronically Signed   By: Elgie Collard M.D.   On: 01/13/2019 22:11    Lab Data:  CBC: Recent Labs  Lab 01/13/19 1603 01/13/19 1901  WBC 7.9 8.1  HGB 14.2 14.6  HCT 47.0* 49.6*  MCV 95.3 96.3  PLT 244 269   Basic Metabolic Panel: Recent Labs  Lab 01/13/19 1603 01/13/19 1901 01/14/19 0034 01/15/19 0450  NA 142  --  142 140  K 3.2*  --  3.4* 3.7  CL 111  --  107 105  CO2 20*  --  24 26  GLUCOSE 112*  --  138* 99  BUN 21  --  21 34*  CREATININE 1.38* 1.35* 1.42* 1.82*  CALCIUM 9.1  --  8.9 8.9   GFR: Estimated Creatinine Clearance: 34.9 mL/min (A) (by C-G formula based on SCr of 1.82 mg/dL (H)). Liver Function Tests: No results for  input(s): AST, ALT, ALKPHOS, BILITOT, PROT, ALBUMIN in the last 168 hours. No results for input(s): LIPASE, AMYLASE in the last 168 hours. No results for input(s): AMMONIA in the last 168 hours. Coagulation Profile: No results for input(s): INR, PROTIME in the last 168 hours. Cardiac Enzymes: Recent Labs  Lab 01/13/19 1901 01/14/19 0034 01/14/19 0644  TROPONINI <0.03 <0.03 <0.03   BNP (last 3 results) No results for input(s): PROBNP in the last 8760 hours. HbA1C: No results for input(s): HGBA1C in the last 72 hours. CBG: No results for input(s): GLUCAP in the last 168 hours. Lipid Profile: No results for input(s): CHOL, HDL, LDLCALC, TRIG, CHOLHDL, LDLDIRECT in the last 72 hours. Thyroid Function Tests: No results for input(s): TSH, T4TOTAL, FREET4, T3FREE, THYROIDAB in the last 72 hours. Anemia Panel: No results for input(s): VITAMINB12, FOLATE, FERRITIN, TIBC, IRON, RETICCTPCT in the last 72 hours. Urine analysis:    Component Value Date/Time   COLORURINE YELLOW 12/16/2018 0024   APPEARANCEUR CLEAR 12/16/2018 0024   LABSPEC 1.034 (H) 12/16/2018 0024   PHURINE 5.0 12/16/2018 0024   GLUCOSEU NEGATIVE 12/16/2018 0024   HGBUR NEGATIVE 12/16/2018 0024   BILIRUBINUR NEGATIVE 12/16/2018 0024   KETONESUR NEGATIVE 12/16/2018 0024   PROTEINUR 100 (A) 12/16/2018 0024   UROBILINOGEN 1.0 07/25/2015 1601   NITRITE NEGATIVE 12/16/2018 0024   LEUKOCYTESUR NEGATIVE 12/16/2018 0024     Ripudeep Rai M.D. [facility] 01/15/2019, 10:52 AM  Pager: [phone] Between 7am to 7pm - call Pager - [phone]  After 7pm go to [email or web address] - password [email or web address]  Call night coverage person covering after 7pm

## 2019-01-16 LAB — BASIC METABOLIC PANEL
Anion gap: 10 (ref 5–15)
BUN: 36 mg/dL — ABNORMAL HIGH (ref 8–23)
CO2: 24 mmol/L (ref 22–32)
Calcium: 9.3 mg/dL (ref 8.9–10.3)
Chloride: 105 mmol/L (ref 98–111)
Creatinine, Ser: 1.72 mg/dL — ABNORMAL HIGH (ref 0.44–1.00)
GFR calc Af Amer: 35 mL/min — ABNORMAL LOW (ref >60)
GFR calc non Af Amer: 30 mL/min — ABNORMAL LOW (ref >60)
Glucose, Bld: 107 mg/dL — ABNORMAL HIGH (ref 70–99)
Potassium: 4 mmol/L (ref 3.5–5.1)
Sodium: 139 mmol/L (ref 135–145)

## 2019-01-16 NOTE — Progress Notes (Signed)
[facility]                                                                              Patient Demographics  Deborah Ho, is a 68 y.o. female, DOB - 11/16/1950, [id number]  Admit date - 01/13/2019   Admitting Physician Seena Face Jenna LuoK Kyi Romanello, MD  Outpatient Primary MD for the patient is [facility]  Outpatient specialists:   LOS - 3  days   Medical records reviewed and are as summarized below:    Chief Complaint  Patient presents with  . Shortness of Breath       Brief summary   Patient is a 68 year old female with history of CAD, chronic systolic CHF with EF 15%, hypertension, hyperlipidemia, GERD, ischemic cardiomyopathy presented to ED with abdominal distention, shortness of breath, peripheral edema.  Patient was admitted from 1/30-2/2 for acute on chronic systolic CHF.  She was discharged on oral Bumex.  Patient did not follow-up with cardiology after the hospital discharge.  She reported that after she takes Bumex she starts having coughing spells and feels drained out.  Then she drinks a bottle of water which helps her cough.  For the past week patient has been having gradually worsening shortness of breath, swelling in her legs and abdominal distention.  She has orthopnea, states that she feels nauseous on lying down.     Assessment & Plan    Principal Problem:   Acute on chronic combined systolic and diastolic CHF (congestive heart failure) (HCC) -Likely worsened due to medical noncompliance, dietary indiscretion, drinking a bottle of water after taking Bumex, elevated BNP with abdominal distention, peripheral edema, dyspnea, orthopnea -2D echo 11/28/2018 had shown EF of 15% with diffuse hypokinesis, grade 3 diastolic dysfunction -Patient was placed on IV Lasix for diuresis, cardiology following, diuretics now held due to creatinine trending up.  Spironolactone held.  Negative balance of 2.9 L. -Difficult situation, patient  reports that she has not tolerated Bumex or torsemide in the past.  Refused Coreg due to concern for hypotension. Restarted Coreg at lower dose 3.125 mg twice a day. -Per patient she had dizziness with Entresto in the past, stopped losartan due to hypotension, was stopped on BiDil during previous admission until follow-up with cardiology outpatient however patient did not follow. -Patient will benefit from advanced heart failure therapies. - Abdominal ultrasound did not show any ascites. -Creatinine downtrending up to 1.7, was 1.8 on 3/1.  Cardiology recommending to hold diuretics today.   Active Problems:   Ischemic cardiomyopathy with history of CAD, PCI in 2015 -Currently no chest pain, follow troponins, management as #1 -Patient reports that she is allergic to aspirin.  Discussed with patient, she states aspirin causes her "mouth to swell " ?  Anaphylaxis  -Per Dr. Mayford Knifeurner, continue Brilinta, has a history of in-stent thrombosis.  Continue beta-blocker, statin    Essential hypertension -BP currently stable, continue Imdur, low-dose beta-blocker, Lasix on hold    CKD (chronic kidney disease), stage III (HCC) -Creatinine close to baseline, 1.2-1.3  -Creatinine trended up to 1.8 on 3/1, Lasix held.  Creatinine now trending down to 1.7  Hyperlipidemia -Continue statin  Moderate pulmonary hypertension Diuretics currently on hold, 2D echo 11/2018 with PA pressure 53 mmHg, right ventricular moderately dilated with mild RV dysfunction.  Code Status: Full CODE STATUS DVT Prophylaxis:   SCD's Family Communication: Discussed in detail with the patient, all imaging results, lab results explained to the patient   Disposition Plan: When cleared by cardiology  Time Spent in minutes   25 minutes  Procedures:  Abdominal ultrasound  Consultants:   Cardiology  Antimicrobials:   Anti-infectives (From admission, onward)   None         Medications  Scheduled Meds: . atorvastatin   40 mg Oral q1800  . benzonatate  100 mg Oral TID  . carvedilol  3.125 mg Oral BID WC  . heparin  5,000 Units Subcutaneous Q8H  . isosorbide mononitrate  30 mg Oral Daily  . loratadine  10 mg Oral Daily  . potassium chloride SA  40 mEq Oral Daily  . sodium chloride flush  3 mL Intravenous Q12H  . ticagrelor  60 mg Oral BID   Continuous Infusions: . sodium chloride     PRN Meds:.sodium chloride, acetaminophen, diphenhydrAMINE-zinc acetate, fluticasone, ondansetron (ZOFRAN) IV, sodium chloride flush      Subjective:   Deborah Ho was seen and examined today.  States not feeling too well, but states shortness of breath and abdominal distention is slowly improving.  No chest pain.   Patient denies dizziness, abdominal pain, N/V/D/C, new weakness, numbess, tingling.   Objective:   Vitals:   01/16/19 0500 01/16/19 0930 01/16/19 0931 01/16/19 1345  BP:  115/74  122/81  Pulse:   76 72  Resp:    (!) 22  Temp:    97.6 F (36.4 C)  TempSrc:    Oral  SpO2:    97%  Weight: 98.2 kg     Height:        Intake/Output Summary (Last 24 hours) at 01/16/2019 1441 Last data filed at 01/16/2019 1400 Gross per 24 hour  Intake 700 ml  Output 5 ml  Net 695 ml     Wt Readings from Last 3 Encounters:  01/16/19 98.2 kg  12/18/18 92.5 kg  12/04/18 89.4 kg    Physical Exam  General: Alert and oriented x 3, NAD  Eyes  HEENT:  Atraumatic, normocephalic  Cardiovascular: S1 S2 clear, no murmurs, RRR. No pedal edema b/l  Respiratory: Decreased breath sound at the bases fairly clear otherwise  Gastrointestinal: Obese, soft, nontender, nondistended, NBS  Ext: no pedal edema bilaterally  Neuro: no new deficits  Musculoskeletal: No cyanosis, clubbing  Skin: No rashes  Psych: Normal affect and demeanor, alert and oriented x3     Data Reviewed:  I have personally reviewed following labs and imaging studies  Micro Results No results found for this or any previous visit (from the  past 240 hour(s)).  Radiology Reports Dg Chest 2 View  Result Date: 01/13/2019 CLINICAL DATA:  Shortness of breath.  Cough. EXAM: CHEST - 2 VIEW COMPARISON:  11/18/2018 and 03/10/2018 FINDINGS: Stable cardiomegaly. Prominent central vascular markings and prominent interstitial markings are similar to prior examinations. Slightly increased densities at the right lung base could represent atelectasis and small right pleural effusion. No acute bone abnormality. IMPRESSION: 1. Stable cardiomegaly with probable chronic vascular congestion. 2. Blunting at the right costophrenic angle is suggestive for a small right pleural effusion and/or atelectasis. Electronically Signed   By: Richarda Overlie M.D.   On: 01/13/2019 16:41  US Abdomen Limited  Result Date: 01/13/2019 CLINICAL DATA:  68 year old female with abdominal distention. Evaluate for ascites. EXAM: LIMITED ABDOMEN ULTRASOUND FOR ASCITES TECHNIQUE: Limited ultrasound survey for ascites was performed in all four abdominal quadrants. COMPARISON:  CT of the abdomen pelvis dated 12/15/2018 FINDINGS: Targeted evaluation of the 4 quadrants of the abdomen performed. No significant free fluid noted. IMPRESSION: No ascites. Electronically Signed   By: Elgie Collard M.D.   On: 01/13/2019 22:11    Lab Data:  CBC: Recent Labs  Lab 01/13/19 1603 01/13/19 1901  WBC 7.9 8.1  HGB 14.2 14.6  HCT 47.0* 49.6*  MCV 95.3 96.3  PLT 244 269   Basic Metabolic Panel: Recent Labs  Lab 01/13/19 1603 01/13/19 1901 01/14/19 0034 01/15/19 0450 01/16/19 0400  NA 142  --  142 140 139  K 3.2*  --  3.4* 3.7 4.0  CL 111  --  107 105 105  CO2 20*  --  24 26 24   GLUCOSE 112*  --  138* 99 107*  BUN 21  --  21 34* 36*  CREATININE 1.38* 1.35* 1.42* 1.82* 1.72*  CALCIUM 9.1  --  8.9 8.9 9.3   GFR: Estimated Creatinine Clearance: 37.2 mL/min (A) (by C-G formula based on SCr of 1.72 mg/dL (H)). Liver Function Tests: No results for input(s): AST, ALT, ALKPHOS,  BILITOT, PROT, ALBUMIN in the last 168 hours. No results for input(s): LIPASE, AMYLASE in the last 168 hours. No results for input(s): AMMONIA in the last 168 hours. Coagulation Profile: No results for input(s): INR, PROTIME in the last 168 hours. Cardiac Enzymes: Recent Labs  Lab 01/13/19 1901 01/14/19 0034 01/14/19 0644  TROPONINI <0.03 <0.03 <0.03   BNP (last 3 results) No results for input(s): PROBNP in the last 8760 hours. HbA1C: No results for input(s): HGBA1C in the last 72 hours. CBG: No results for input(s): GLUCAP in the last 168 hours. Lipid Profile: No results for input(s): CHOL, HDL, LDLCALC, TRIG, CHOLHDL, LDLDIRECT in the last 72 hours. Thyroid Function Tests: No results for input(s): TSH, T4TOTAL, FREET4, T3FREE, THYROIDAB in the last 72 hours. Anemia Panel: No results for input(s): VITAMINB12, FOLATE, FERRITIN, TIBC, IRON, RETICCTPCT in the last 72 hours. Urine analysis:    Component Value Date/Time   COLORURINE YELLOW 12/16/2018 0024   APPEARANCEUR CLEAR 12/16/2018 0024   LABSPEC 1.034 (H) 12/16/2018 0024   PHURINE 5.0 12/16/2018 0024   GLUCOSEU NEGATIVE 12/16/2018 0024   HGBUR NEGATIVE 12/16/2018 0024   BILIRUBINUR NEGATIVE 12/16/2018 0024   KETONESUR NEGATIVE 12/16/2018 0024   PROTEINUR 100 (A) 12/16/2018 0024   UROBILINOGEN 1.0 07/25/2015 1601   NITRITE NEGATIVE 12/16/2018 0024   LEUKOCYTESUR NEGATIVE 12/16/2018 0024     Michelina Mexicano M.D. [facility] 01/16/2019, 2:41 PM  Pager: [phone] Between 7am to 7pm - call Pager - [phone]  After 7pm go to [email or web address] - password [email or web address]  Call night coverage person covering after 7pm

## 2019-01-16 NOTE — Care Management Important Message (Signed)
Important Message  Patient Details  Name: Brynlie Loges MRN: [id number] Date of Birth: 11-May-1951   Medicare Important Message Given:  Yes    Caren Macadam 01/16/2019, 11:37 AMImportant Message  Patient Details  Name: Shravani Eun MRN: [id number] Date of Birth: [birth year]   Medicare Important Message Given:  Yes    Caren Macadam 01/16/2019, 11:37 AM

## 2019-01-16 NOTE — Progress Notes (Signed)
Progress Note  Patient Name: Deborah Ho Date of Encounter: 01/16/2019  Primary Cardiologist: Peter Swaziland, MD   Subjective   No significant overnight events. Patient reports shortness of breath and abdominal distension have improved. No chest pain.   I had long conversation with patient about pathophysiology of heart failure and importance of medications and diet.   Inpatient Medications    Scheduled Meds: . atorvastatin  40 mg Oral q1800  . benzonatate  100 mg Oral TID  . carvedilol  3.125 mg Oral BID WC  . heparin  5,000 Units Subcutaneous Q8H  . Influenza vac split quadrivalent PF  0.5 mL Intramuscular Tomorrow-1000  . isosorbide mononitrate  30 mg Oral Daily  . loratadine  10 mg Oral Daily  . potassium chloride SA  40 mEq Oral Daily  . sodium chloride flush  3 mL Intravenous Q12H  . ticagrelor  60 mg Oral BID   Continuous Infusions: . sodium chloride     PRN Meds: sodium chloride, acetaminophen, diphenhydrAMINE-zinc acetate, fluticasone, ondansetron (ZOFRAN) IV, sodium chloride flush   Vital Signs    Vitals:   01/16/19 0459 01/16/19 0500 01/16/19 0930 01/16/19 0931  BP: 138/83  115/74   Pulse: 72   76  Resp: 20     Temp: (!) 97.5 F (36.4 C)     TempSrc: Axillary     SpO2: 97%     Weight:  98.2 kg    Height:        Intake/Output Summary (Last 24 hours) at 01/16/2019 1115 Last data filed at 01/16/2019 0610 Gross per 24 hour  Intake -  Output 5 ml  Net -5 ml   Filed Weights   01/14/19 0400 01/15/19 0509 01/16/19 0500  Weight: 96.4 kg 97.1 kg 98.2 kg    Telemetry    Sinus rhythm with heart rates in the 70's to 80's. 3 beats of non-sustained VT also noted.  - Personally Reviewed  ECG    No new ECG tracing today. - Personally Reviewed  Physical Exam   GEN: Obese African-American female resting comfortably. Alert and in no acute distress.   Neck: Supple. No JVD appreciated.. Cardiac: RRR. No murmurs, gallops, or rubs.  Respiratory: Clear to  auscultation bilaterally. No wheezes, rhonchi, or rales. GI: Abdomen soft, mildly distended, and non-tender. Bowel sounds present. MS: Trace lower extremity edema. No deformity. Skin: Warm and dry. Neuro:  No focal deficits. Psych: Normal affect. Responds appropriately.  Labs    Chemistry Recent Labs  Lab 01/14/19 0034 01/15/19 0450 01/16/19 0400  NA 142 140 139  K 3.4* 3.7 4.0  CL 107 105 105  CO2 24 26 24   GLUCOSE 138* 99 107*  BUN 21 34* 36*  CREATININE 1.42* 1.82* 1.72*  CALCIUM 8.9 8.9 9.3  GFRNONAA 38* 28* 30*  GFRAA 44* 33* 35*  ANIONGAP 11 9 10      Hematology Recent Labs  Lab 01/13/19 1603 01/13/19 1901  WBC 7.9 8.1  RBC 4.93 5.15*  HGB 14.2 14.6  HCT 47.0* 49.6*  MCV 95.3 96.3  MCH 28.8 28.3  MCHC 30.2 29.4*  RDW 14.8 14.7  PLT 244 269    Cardiac Enzymes Recent Labs  Lab 01/13/19 1901 01/14/19 0034 01/14/19 0644  TROPONINI <0.03 <0.03 <0.03   No results for input(s): TROPIPOC in the last 168 hours.   BNP Recent Labs  Lab 01/13/19 1603  BNP 1,888.6*     DDimer No results for input(s): DDIMER in the last 168 hours.  Radiology    No results found.  Cardiac Studies   Echocardiogram 11/28/2018: Study Conclusions: - Left ventricle: The cavity size was normal. Wall thickness was   increased in a pattern of mild LVH. The estimated ejection   fraction was 15%. Diffuse hypokinesis. No mural thrombus by   Definity contrast. Doppler parameters are consistent with   restrictive left ventricular relaxation (grade 3 diastolic   dysfunction). The E/e&' ratio is >20, suggesting markedly elevated   LV filling pressure. - Mitral valve: Mildly thickened leaflets . There was moderate   regurgitation. - Left atrium: The atrium was normal in size. - Right ventricle: The cavity size was moderately dilated. Mild   systolic dysfunction. - Right atrium: Severely dilated. - Tricuspid valve: There was moderate regurgitation. - Pulmonary arteries: PA  peak pressure: 53 mm Hg (S). - Inferior vena cava: The vessel was dilated. The respirophasic   diameter changes were blunted (< 50%), consistent with elevated   central venous pressure.  Impressions: - Compared to a prior study in 11/2017, the LVEF is unchanged at   15%. Left and right heart pressures are quite elevated.  Patient Profile   Deborah Ho is a 67 y.o. female with a history of anterior STEMI in 2015 with PCI x2 to LAD with early re-stent thrombosis due to missing doses of Brilinta, chronic combined systolic and diastolic CHF, hypertension, hyperlipidemia, and tobacco use who is being seen today for evaluation of acute on chronic CHF. Patient has a history of non-compliance and has failed to follow-up with [facility] since [facility]. She had been on Torsemide 80mg  twice daily at that time but reportedly stopped taking this due to mucus in her chest. Patient was discharged from most recent hospitalization (12/15/2018 to 12/18/2018) on Bumex but stopped taking it due to side effects (cough and dry mouth/throat)  Assessment & Plan    Acute on Chronic Combined CHF - Echo from 11/28/2018 showed LVEF of 15% with diffuse hypokinesis and grade 3 diastolic dysfunction. - Documented output of 305 mL in the past 24 hours (unsure if this is accurate) with net negative 3.4 L since admission. - Patient previously adamant about not wanting to take Torsemide or Bumex again due to side effects. However, patient told me she is willing to retry one of these. IV Lasix was held yesterday due to increase renal function. Serum creatinine improved some today at 1.72 (down from 1.82 yesterday). May need to continue to hold diuretics for today to allow renal function to continue to improve. Consider retrying Torsemide at discharge.  - Patient previously on Entresto but had dizziness with this and has refused to try again. - Continue Coreg 3.125mg  twice day. Consider increasing this to 6.25mg   twice daily. - Continue Imdur 30mg  daily.  - Continue to hold ACEi, ARB, and Spironolactone due to renal function. - Continue fluid restriction to <2 L daily and sodium restriction < 2,000 mg daily. Will ask Nutritionist to come and speak with patient about heart failure diet.  - Continue to monitor daily weights, strict I/O's, and renal function. - Patient ultimately needs to get back into the [facility]. She may benefit from a right heart catheterization at some point.   Moderate Pulmonary Hypertension - Moderate on Echo in 11/2018 with PASP of 53 mmHg. This is significantly higher than Echo from 11/2017 when PASP was 31 mmHg. Right ventricle  was also noted to be moderately dilated with mild dysfunction.  CAD  -  Continue Brilinta, beta-blocker, and high-intensity statin.  - Patient has Aspirin listed as an allergy - reportedly causes her tongue and face to swell.   Moderate Mitral Regurgitation - Likely related to annular dilatation from severe left ventricular dysfunction. - Continue heart failure medications.   Hypertension - BP currently well controlled at 115/74.  - Continue Coreg as above.     For questions or updates, please contact [facility] Please consult [email or web address] for contact info under Cardiology/STEMI.      Signed, Corrin Parker, PA-C  01/16/2019, 11:15 AM

## 2019-01-17 ENCOUNTER — Inpatient Hospital Stay (HOSPITAL_COMMUNITY): Payer: Medicare Other

## 2019-01-17 DIAGNOSIS — R1030 Lower abdominal pain, unspecified: Secondary | ICD-10-CM

## 2019-01-17 DIAGNOSIS — R140 Abdominal distension (gaseous): Secondary | ICD-10-CM

## 2019-01-17 LAB — BASIC METABOLIC PANEL
Anion gap: 11 (ref 5–15)
BUN: 32 mg/dL — ABNORMAL HIGH (ref 8–23)
CO2: 20 mmol/L — ABNORMAL LOW (ref 22–32)
Calcium: 9.3 mg/dL (ref 8.9–10.3)
Chloride: 108 mmol/L (ref 98–111)
Creatinine, Ser: 1.51 mg/dL — ABNORMAL HIGH (ref 0.44–1.00)
GFR calc Af Amer: 41 mL/min — ABNORMAL LOW (ref >60)
GFR calc non Af Amer: 35 mL/min — ABNORMAL LOW (ref >60)
Glucose, Bld: 99 mg/dL (ref 70–99)
Potassium: 4.3 mmol/L (ref 3.5–5.1)
Sodium: 139 mmol/L (ref 135–145)

## 2019-01-17 MED ORDER — ONDANSETRON 4 MG PO TBDP
4.0000 mg | ORAL_TABLET | Freq: Once | ORAL | Status: AC
  Administered 2019-01-17: 4 mg via ORAL
  Filled 2019-01-17: qty 1

## 2019-01-17 MED ORDER — BUMETANIDE 1 MG PO TABS
2.0000 mg | ORAL_TABLET | Freq: Every day | ORAL | Status: DC
  Administered 2019-01-17 – 2019-01-18 (×2): 2 mg via ORAL
  Filled 2019-01-17 (×2): qty 2

## 2019-01-17 MED ORDER — PROMETHAZINE HCL 25 MG/ML IJ SOLN
12.5000 mg | INTRAMUSCULAR | Status: DC | PRN
  Administered 2019-01-17: 12.5 mg via INTRAVENOUS
  Filled 2019-01-17 (×2): qty 1

## 2019-01-17 MED ORDER — ENSURE MAX PROTEIN PO LIQD
11.0000 [oz_av] | Freq: Every day | ORAL | Status: DC
  Administered 2019-01-17: 11 [oz_av] via ORAL
  Filled 2019-01-17 (×2): qty 330

## 2019-01-17 NOTE — Progress Notes (Signed)
Assumed care of patient at this time. Patient is stable with no complaints at this time. Agree with previously documented assessment. Will continue to monitor patient.   

## 2019-01-17 NOTE — Progress Notes (Signed)
Progress Note  Patient Name: Deborah Ho Date of Encounter: 01/17/2019  Primary Cardiologist: Peter SwazilandJordan, MD   Subjective   No significant overnight events. Patient reports orthopnea last night that prevented her from laying flat but thinks her abdominal distention is continuing to improve. She had an episode of nausea/vomiting this morning but is feeling better after receiving Zofran. She has no other complaints at this time.   Inpatient Medications    Scheduled Meds: . atorvastatin  40 mg Oral q1800  . benzonatate  100 mg Oral TID  . carvedilol  3.125 mg Oral BID WC  . heparin  5,000 Units Subcutaneous Q8H  . isosorbide mononitrate  30 mg Oral Daily  . loratadine  10 mg Oral Daily  . potassium chloride SA  40 mEq Oral Daily  . sodium chloride flush  3 mL Intravenous Q12H  . ticagrelor  60 mg Oral BID   Continuous Infusions: . sodium chloride     PRN Meds: sodium chloride, acetaminophen, diphenhydrAMINE-zinc acetate, fluticasone, ondansetron (ZOFRAN) IV, sodium chloride flush   Vital Signs    Vitals:   01/16/19 1345 01/16/19 1740 01/16/19 2133 01/17/19 0524  BP: 122/81 128/80 105/63 (!) 142/95  Pulse: 72  73 81  Resp: (!) 22  15 16   Temp: 97.6 F (36.4 C)  (!) 97.3 F (36.3 C) (!) 97.4 F (36.3 C)  TempSrc: Oral  Oral Oral  SpO2: 97%  94% 100%  Weight:    98.4 kg  Height:        Intake/Output Summary (Last 24 hours) at 01/17/2019 0722 Last data filed at 01/17/2019 0600 Gross per 24 hour  Intake 1300 ml  Output 1100 ml  Net 200 ml   Filed Weights   01/15/19 0509 01/16/19 0500 01/17/19 0524  Weight: 97.1 kg 98.2 kg 98.4 kg    Telemetry    Sinus rhythm with heart rates in the 70's to 80's.   - Personally Reviewed  ECG    No new ECG tracing since 01/14/2019. - Personally Reviewed  Physical Exam   GEN: Obese African-American female resting comfortably. Alert and in no acute distress.   Neck: Supple. JVD difficult to assess due to body  habitus. Cardiac: RRR. No murmurs, gallops, or rubs.  Respiratory: Clear to auscultation bilaterally. No wheezes, rhonchi, or rales. GI: Abdomen soft, mildly distended, and non-tender. Bowel sounds present. MS: Trace lower extremity edema. No deformity. Skin: Warm and dry. Neuro:  No focal deficits. Psych: Normal affect. Responds appropriately.  Labs    Chemistry Recent Labs  Lab 01/15/19 0450 01/16/19 0400 01/17/19 0443  NA 140 139 139  K 3.7 4.0 4.3  CL 105 105 108  CO2 26 24 20*  GLUCOSE 99 107* 99  BUN 34* 36* 32*  CREATININE 1.82* 1.72* 1.51*  CALCIUM 8.9 9.3 9.3  GFRNONAA 28* 30* 35*  GFRAA 33* 35* 41*  ANIONGAP 9 10 11      Hematology Recent Labs  Lab 01/13/19 1603 01/13/19 1901  WBC 7.9 8.1  RBC 4.93 5.15*  HGB 14.2 14.6  HCT 47.0* 49.6*  MCV 95.3 96.3  MCH 28.8 28.3  MCHC 30.2 29.4*  RDW 14.8 14.7  PLT 244 269    Cardiac Enzymes Recent Labs  Lab 01/13/19 1901 01/14/19 0034 01/14/19 0644  TROPONINI <0.03 <0.03 <0.03   No results for input(s): TROPIPOC in the last 168 hours.   BNP Recent Labs  Lab 01/13/19 1603  BNP 1,888.6*     DDimer No results  for input(s): DDIMER in the last 168 hours.   Radiology    No results found.  Cardiac Studies   Echocardiogram 11/28/2018: Study Conclusions: - Left ventricle: The cavity size was normal. Wall thickness was   increased in a pattern of mild LVH. The estimated ejection   fraction was 15%. Diffuse hypokinesis. No mural thrombus by   Definity contrast. Doppler parameters are consistent with   restrictive left ventricular relaxation (grade 3 diastolic   dysfunction). The E/e&' ratio is >20, suggesting markedly elevated   LV filling pressure. - Mitral valve: Mildly thickened leaflets . There was moderate   regurgitation. - Left atrium: The atrium was normal in size. - Right ventricle: The cavity size was moderately dilated. Mild   systolic dysfunction. - Right atrium: Severely dilated. -  Tricuspid valve: There was moderate regurgitation. - Pulmonary arteries: PA peak pressure: 53 mm Hg (S). - Inferior vena cava: The vessel was dilated. The respirophasic   diameter changes were blunted (< 50%), consistent with elevated   central venous pressure.  Impressions: - Compared to a prior study in 11/2017, the LVEF is unchanged at   15%. Left and right heart pressures are quite elevated.  Patient Profile   Deborah Ho is a 68 y.o. female with a history of anterior STEMI in 2015 with early re-stent thrombosis due to missing doses of Brilinta which was then treated with DES x2 to the LAD, chronic combined systolic and diastolic CHF, hypertension, hyperlipidemia, and tobacco use who is being seen today for evaluation of acute on chronic CHF. Patient has a history of non-compliance and has failed to follow-up with the [facility] since 11/2017. She had been on Torsemide 80mg  twice daily at that time but reportedly stopped taking this due to mucus in her chest. Patient was discharged from most recent hospitalization (12/15/2018 to 12/18/2018) on Bumex but stopped taking it due to side effects (cough and dry mouth/throat)  Assessment & Plan    Acute on Chronic Combined CHF - Echo from 11/28/2018 showed LVEF of 15% with diffuse hypokinesis and grade 3 diastolic dysfunction. - Documented output of 1.1 L in the past 24 hours with net negative 3.4 L since admission. - Patient previously adamant about not wanting to take Torsemide or Bumex again due to side effects. However, patient told me she is willing to retry one of these. IV Lasix has been held the last 2 days due to renal function. Serum creatinine continue trend down at 1.51 today (down from 1.72 yesterday). Likely can restart PO diuretic today. Per last CHF clinic note, patient was taking Lasix 80mg  twice daily and Metolazone 2.5mg  three times weekly. Consider restarting PO Lasix 80mg  twice daily.   - Patient previously on  Entresto but had dizziness with this and has refused to try again. - Continue Coreg 3.125mg  twice day. Would up-titrate as able.  - Continue Imdur 30mg  daily.  - Continue to hold ACEi, ARB, and Spironolactone due to renal function.  - Continue fluid restriction to <2 L daily and sodium restriction < 2,000 mg daily. Have asked Nutritionist to come and speak with patient about heart failure diet.  - Continue to monitor daily weights, strict I/O's, and renal function. - Patient ultimately needs to get back into the [facility]. She may benefit from a right heart catheterization at some point.   Moderate Pulmonary Hypertension - Moderate on Echo in 11/2018 with PASP of 53 mmHg. This is significantly higher than Echo from  11/2017 when PASP was 31 mmHg. Right ventricle  was also noted to be moderately dilated with mild dysfunction.  CAD  - Patient denies any chest pain. - Continue Brilinta, beta-blocker, and high-intensity statin.  - Patient has Aspirin listed as an allergy - reportedly causes her tongue and face to swell.   Moderate Mitral Regurgitation - Likely related to annular dilatation from severe left ventricular dysfunction. - Continue heart failure medications.   Hypertension - Most recent BP 142/95.   - Continue Coreg as above.    For questions or updates, please contact [facility] Please consult [email or web address] for contact info under Cardiology/STEMI.      Signed, Corrin Parker, PA-C  01/17/2019, 7:22 AM

## 2019-01-17 NOTE — Progress Notes (Signed)
[facility]                                                                              Patient Demographics  Deborah Ho, is a 68 y.o. female, DOB - 01/07/1951, AVW:[id number]  Admit date - 01/13/2019   Admitting Physician Yahmir Sokolov Jenna LuoK Dandria Griego, MD  Outpatient Primary MD for the patient is [facility]  Outpatient specialists:   LOS - 4  days   Medical records reviewed and are as summarized below:    Chief Complaint  Patient presents with  . Shortness of Breath       Brief summary   Patient is a 68 year old female with history of CAD, chronic systolic CHF with EF 15%, hypertension, hyperlipidemia, GERD, ischemic cardiomyopathy presented to ED with abdominal distention, shortness of breath, peripheral edema.  Patient was admitted from 1/30-2/2 for acute on chronic systolic CHF.  She was discharged on oral Bumex.  Patient did not follow-up with cardiology after the hospital discharge.  She reported that after she takes Bumex she starts having coughing spells and feels drained out.  Then she drinks a bottle of water which helps her cough.  For the past week patient has been having gradually worsening shortness of breath, swelling in her legs and abdominal distention.  She has orthopnea, states that she feels nauseous on lying down.     Assessment & Plan    Principal Problem:   Acute on chronic combined systolic and diastolic CHF (congestive heart failure) (HCC) -Likely worsened due to medical noncompliance, dietary indiscretion, drinking a bottle of water after taking Bumex, elevated BNP with abdominal distention, peripheral edema, dyspnea, orthopnea -2D echo 11/28/2018 had shown EF of 15% with diffuse hypokinesis, grade 3 diastolic dysfunction -Patient was placed on IV Lasix for diuresis, cardiology was consulted.  Creatinine trended up to 1.8 hence diuretics were held. Negative balance of 3.4 L   -Difficult situation, patient reports that  she has not tolerated Bumex or torsemide in the past.   - Restarted Coreg at lower dose 3.125 mg twice a day. -Per patient she had dizziness with Entresto in the past, stopped losartan due to hypotension, was stopped on BiDil during previous admission until follow-up with cardiology outpatient however patient did not follow. -Patient will benefit from advanced heart failure therapies. - Abdominal ultrasound did not show any ascites. -Management per cardiology  Active Problems:  Nausea and vomiting -Unclear etiology, stat abdominal x-ray did not show any SBO/ileus or acute pathology -Downgraded diet to full liquids, placed on IV Phenergan    Ischemic cardiomyopathy with history of CAD, PCI in 2015 -Currently no chest pain, follow troponins, management as #1 -Patient reports that she is allergic to aspirin.  Discussed with patient, she states aspirin causes her "mouth to swell " ?  Anaphylaxis  -Per Dr. Mayford Knifeurner, continue Brilinta, has a history of in-stent thrombosis.  Continue beta-blocker, statin    Essential hypertension -BP improving, continue Imdur, low-dose Coreg     CKD (chronic kidney disease), stage III (HCC) -Creatinine close to baseline, 1.2-1.3  -Creatinine trended up to 1.8 on 3/1, Lasix was held -Creatinine now  trending down, 1.5 today, cardiology to restart diuretics today    Hyperlipidemia -Continue statin  Moderate pulmonary hypertension Diuretics currently on hold, 2D echo 11/2018 with PA pressure 53 mmHg, right ventricular moderately dilated with mild RV dysfunction.  Code Status: Full CODE STATUS DVT Prophylaxis:   SCD's Family Communication: Discussed in detail with the patient, all imaging results, lab results explained to the patient   Disposition Plan:  Plan per cardiology regarding diuretics outpatient. Hopefully DC home in a.m., complaining of nausea and vomiting today  Time Spent in minutes   25 minutes  Procedures:  Abdominal  ultrasound  Consultants:   Cardiology  Antimicrobials:   Anti-infectives (From admission, onward)   None         Medications  Scheduled Meds: . atorvastatin  40 mg Oral q1800  . benzonatate  100 mg Oral TID  . bumetanide  2 mg Oral Daily  . carvedilol  3.125 mg Oral BID WC  . heparin  5,000 Units Subcutaneous Q8H  . isosorbide mononitrate  30 mg Oral Daily  . loratadine  10 mg Oral Daily  . ondansetron  4 mg Oral Once  . potassium chloride SA  40 mEq Oral Daily  . sodium chloride flush  3 mL Intravenous Q12H  . ticagrelor  60 mg Oral BID   Continuous Infusions: . sodium chloride     PRN Meds:.sodium chloride, acetaminophen, diphenhydrAMINE-zinc acetate, fluticasone, promethazine, sodium chloride flush      Subjective:   [name] was seen and examined today.  Complaining of nausea and vomiting, not feeling too well today.  No chest pain.  Shortness of breath better.     Afebrile.  Objective:   Vitals:   01/16/19 2133 01/17/19 0524 01/17/19 1053 01/17/19 1314  BP: 105/63 (!) 142/95 (!) 148/92 118/78  Pulse: 73 81 78 73  Resp: 15 16  (!) 22  Temp: (!) 97.3 F (36.3 C) (!) 97.4 F (36.3 C)  97.6 F (36.4 C)  TempSrc: Oral Oral  Oral  SpO2: 94% 100%  99%  Weight:  98.4 kg    Height:        Intake/Output Summary (Last 24 hours) at 01/17/2019 1438 Last data filed at 01/17/2019 0600 Gross per 24 hour  Intake 600 ml  Output 1100 ml  Net -500 ml     Wt Readings from Last 3 Encounters:  01/17/19 98.4 kg  12/18/18 92.5 kg  12/04/18 89.4 kg   Physical Exam  General: Alert and oriented x 3, NAD  Eyes:   HEENT:  Atraumatic, normocephalic  Cardiovascular: S1 S2 clear, RRR. No pedal edema b/l  Respiratory: CTAB, no wheezing, rales or rhonchi  Gastrointestinal: Soft, mildly distended, NBS  Ext: no pedal edema bilaterally  Neuro: no new deficits  Musculoskeletal: No cyanosis, clubbing  Skin: No rashes  Psych: Normal affect and demeanor,  alert and oriented x3     Data Reviewed:  I have personally reviewed following labs and imaging studies  Micro Results No results found for this or any previous visit (from the past 240 hour(s)).  Radiology Reports Dg Chest 2 View  Result Date: 01/13/2019 CLINICAL DATA:  Shortness of breath.  Cough. EXAM: CHEST - 2 VIEW COMPARISON:  11/18/2018 and 03/10/2018 FINDINGS: Stable cardiomegaly. Prominent central vascular markings and prominent interstitial markings are similar to prior examinations. Slightly increased densities at the right lung base could represent atelectasis and small right pleural effusion. No acute bone abnormality. IMPRESSION: 1. Stable cardiomegaly with probable chronic  vascular congestion. 2. Blunting at the right costophrenic angle is suggestive for a small right pleural effusion and/or atelectasis. Electronically Signed   By: Richarda Overlie M.D.   On: 01/13/2019 16:41   US Abdomen Limited  Result Date: 01/13/2019 CLINICAL DATA:  68 year old female with abdominal distention. Evaluate for ascites. EXAM: LIMITED ABDOMEN ULTRASOUND FOR ASCITES TECHNIQUE: Limited ultrasound survey for ascites was performed in all four abdominal quadrants. COMPARISON:  CT of the abdomen pelvis dated 12/15/2018 FINDINGS: Targeted evaluation of the 4 quadrants of the abdomen performed. No significant free fluid noted. IMPRESSION: No ascites. Electronically Signed   By: Elgie Collard M.D.   On: 01/13/2019 22:11   Dg Abd Portable 2v  Result Date: 01/17/2019 CLINICAL DATA:  Approximate one-week history of persistent generalized abdominal pain, nausea and vomiting. EXAM: PORTABLE ABDOMEN - 2 VIEW COMPARISON:  CT abdomen and pelvis 12/15/2018. Acute abdomen series 03/26/2017. FINDINGS: Portable AP supine and LEFT lateral decubitus images were obtained. Bowel gas pattern unremarkable without evidence of obstruction or significant ileus. No evidence of free air or significant air-fluid levels on the lateral  decubitus image. Expected stool burden in the colon. No visible opaque urinary tract calculi. Degenerative changes involving the lumbar spine. IMPRESSION: No acute abdominal abnormality. Electronically Signed   By: Hulan Saas M.D.   On: 01/17/2019 11:51    Lab Data:  CBC: Recent Labs  Lab 01/13/19 1603 01/13/19 1901  WBC 7.9 8.1  HGB 14.2 14.6  HCT 47.0* 49.6*  MCV 95.3 96.3  PLT 244 269   Basic Metabolic Panel: Recent Labs  Lab 01/13/19 1603 01/13/19 1901 01/14/19 0034 01/15/19 0450 01/16/19 0400 01/17/19 0443  NA 142  --  142 140 139 139  K 3.2*  --  3.4* 3.7 4.0 4.3  CL 111  --  107 105 105 108  CO2 20*  --  24 26 24  20*  GLUCOSE 112*  --  138* 99 107* 99  BUN 21  --  21 34* 36* 32*  CREATININE 1.38* 1.35* 1.42* 1.82* 1.72* 1.51*  CALCIUM 9.1  --  8.9 8.9 9.3 9.3   GFR: Estimated Creatinine Clearance: 42.4 mL/min (A) (by C-G formula based on SCr of 1.51 mg/dL (H)). Liver Function Tests: No results for input(s): AST, ALT, ALKPHOS, BILITOT, PROT, ALBUMIN in the last 168 hours. No results for input(s): LIPASE, AMYLASE in the last 168 hours. No results for input(s): AMMONIA in the last 168 hours. Coagulation Profile: No results for input(s): INR, PROTIME in the last 168 hours. Cardiac Enzymes: Recent Labs  Lab 01/13/19 1901 01/14/19 0034 01/14/19 0644  TROPONINI <0.03 <0.03 <0.03   BNP (last 3 results) No results for input(s): PROBNP in the last 8760 hours. HbA1C: No results for input(s): HGBA1C in the last 72 hours. CBG: No results for input(s): GLUCAP in the last 168 hours. Lipid Profile: No results for input(s): CHOL, HDL, LDLCALC, TRIG, CHOLHDL, LDLDIRECT in the last 72 hours. Thyroid Function Tests: No results for input(s): TSH, T4TOTAL, FREET4, T3FREE, THYROIDAB in the last 72 hours. Anemia Panel: No results for input(s): VITAMINB12, FOLATE, FERRITIN, TIBC, IRON, RETICCTPCT in the last 72 hours. Urine analysis:    Component Value Date/Time    COLORURINE YELLOW 12/16/2018 0024   APPEARANCEUR CLEAR 12/16/2018 0024   LABSPEC 1.034 (H) 12/16/2018 0024   PHURINE 5.0 12/16/2018 0024   GLUCOSEU NEGATIVE 12/16/2018 0024   HGBUR NEGATIVE 12/16/2018 0024   BILIRUBINUR NEGATIVE 12/16/2018 0024   KETONESUR NEGATIVE 12/16/2018 0024  PROTEINUR 100 (A) 12/16/2018 0024   UROBILINOGEN 1.0 07/25/2015 1601   NITRITE NEGATIVE 12/16/2018 0024   LEUKOCYTESUR NEGATIVE 12/16/2018 0024     Asja Frommer M.D. [facility] 01/17/2019, 2:38 PM  Pager: [phone] Between 7am to 7pm - call Pager - [phone]  After 7pm go to [email or web address] - password [email or web address]  Call night coverage person covering after 7pm

## 2019-01-17 NOTE — Progress Notes (Addendum)
Initial Nutrition Assessment  DOCUMENTATION CODES:   Obesity unspecified  INTERVENTION:    Ensure MAX Protein po once daily, each supplement provides 150 kcal and 30 grams of protein  NUTRITION DIAGNOSIS:   Inadequate oral intake related to vomiting, nausea as evidenced by per patient/family report.  GOAL:   Patient will meet greater than or equal to 90% of their needs  MONITOR:   PO intake, Weight trends, Labs, Supplement acceptance, Diet advancement  REASON FOR ASSESSMENT:   Consult Diet education  ASSESSMENT:   Patient with PMH significant for CAD, CHF, HTN, HLD, GERD, and ischemic cardiomyopathy. Presents this admission with abdominal distention, shortness of breath, and peripheral edema. Admitted for acute exacerbation of CHF, nausea, and vomiting.    RD consulted for CHF education. Pt has had multiple educations in the past. Discussed her recent dietary recall. A typical day consist of:   B- 1 egg, 1 slice of bacon, and toast with jelly L- 1/2 a pack of Ramen noodles and canned green beans D- cream of mushroom soup or rice a roni with [place] sausage   It seems pt is doing well with not adding table salt to meals but struggles with foods that contain high amounts of sodium. RD provided examples on ways to decrease sodium intake in diet. Discouraged intake of processed/canned foods. Discussed the difference between low sodium and no sodium on packaging label. Encouraged fresh or frozen fruits and vegetables.   Pt reports having poor appetite for the last week due to nausea and vomiting. Meal completions charted as 0% since yesterday. Her diet has been changed to full liquid. RD to provide supplementation.   Pt endorses a UBW of 200 lb and denies recent wt loss. Records indicate pt has gained wt over the last year, likely fluid related. Nutrition-Focused physical exam completed.   Medications reviewed and include: 40 mEq KCl once daily Labs reviewed.   NUTRITION -  FOCUSED PHYSICAL EXAM:    Most Recent Value  Orbital Region  No depletion  Upper Arm Region  No depletion  Thoracic and Lumbar Region  Unable to assess  Buccal Region  No depletion  Temple Region  No depletion  Clavicle Bone Region  No depletion  Clavicle and Acromion Bone Region  No depletion  Scapular Bone Region  Unable to assess  Dorsal Hand  No depletion  Patellar Region  No depletion  Anterior Thigh Region  No depletion  Posterior Calf Region  No depletion  Edema (RD Assessment)  Moderate  Hair  Reviewed  Eyes  Reviewed  Mouth  Reviewed  Skin  Reviewed  Nails  Reviewed     Diet Order:   Diet Order            Diet full liquid Room service appropriate? Yes; Fluid consistency: Thin  Diet effective now              EDUCATION NEEDS:   Education needs have been addressed  Skin:  Skin Assessment: Reviewed RN Assessment  Last BM:  3/3  Height:   Ht Readings from Last 1 Encounters:  01/13/19 5' 5.5" (1.664 m)    Weight:   Wt Readings from Last 1 Encounters:  01/17/19 98.4 kg    Ideal Body Weight:  59.1 kg  BMI:  Body mass index is 35.56 kg/m.  Estimated Nutritional Needs:   Kcal:  1600-1800 kcal  Protein:  80-95 grams  Fluid:  1200 ml fluid restriction   Vanessa Kick RD, LDN Clinical Nutrition  Pager # - [phone]

## 2019-01-17 NOTE — Progress Notes (Signed)
Pt experiencing severe nausea unrelieved by Zofran, 2 episodes of vomiting. MD Rai made aware. New orders include PRN phenergan, full liquid diet, and abd xray.   Miki Blank, Lavone Orn, RN

## 2019-01-17 NOTE — Progress Notes (Signed)
Pt's IV infiltrated. RN and IV team unsuccessful in starting new site. Per IV RN, oncoming IV RN can reattempt at shift change. IV phenergan not given. MD made aware.   Luree Palla, Lavone Orn, RN

## 2019-01-18 ENCOUNTER — Encounter (HOSPITAL_COMMUNITY): Payer: Self-pay

## 2019-01-18 ENCOUNTER — Other Ambulatory Visit: Payer: Self-pay | Admitting: Student

## 2019-01-18 DIAGNOSIS — N183 Chronic kidney disease, stage 3 unspecified: Secondary | ICD-10-CM

## 2019-01-18 LAB — BASIC METABOLIC PANEL
Anion gap: 11 (ref 5–15)
BUN: 27 mg/dL — ABNORMAL HIGH (ref 8–23)
CO2: 22 mmol/L (ref 22–32)
Calcium: 9.3 mg/dL (ref 8.9–10.3)
Chloride: 107 mmol/L (ref 98–111)
Creatinine, Ser: 1.62 mg/dL — ABNORMAL HIGH (ref 0.44–1.00)
GFR calc Af Amer: 38 mL/min — ABNORMAL LOW (ref >60)
GFR calc non Af Amer: 33 mL/min — ABNORMAL LOW (ref >60)
Glucose, Bld: 103 mg/dL — ABNORMAL HIGH (ref 70–99)
Potassium: 4.4 mmol/L (ref 3.5–5.1)
Sodium: 140 mmol/L (ref 135–145)

## 2019-01-18 MED ORDER — BUMETANIDE 2 MG PO TABS: 2.0000 mg | ORAL_TABLET | Freq: Every day | ORAL | 1 refills | Status: DC

## 2019-01-18 MED ORDER — CARVEDILOL 3.125 MG PO TABS: 3.1250 mg | ORAL_TABLET | Freq: Two times a day (BID) | ORAL | 1 refills | Status: DC

## 2019-01-18 NOTE — Discharge Summary (Signed)
Physician Discharge Summary  Deborah Ho YKD:[id number] DOB: 1951/01/22 DOA: 01/13/2019  PCP: [facility]  Admit date: 01/13/2019 Discharge date: 01/18/2019  Admitted From: home Disposition:  home  Recommendations for Outpatient Follow-up:  1. Follow up with advanced heart failure clinic as scheduled for repeat BMP  Home Health: RN Equipment/Devices: none  Discharge Condition: stable CODE STATUS: Full code Diet recommendation: low sodium  HPI: Per admitting MD, Patient is a 68 year old female with history of CAD, chronic systolic CHF with EF 15%, hypertension, hyperlipidemia, GERD, ischemic cardiomyopathy presented to ED with abdominal distention, shortness of breath, peripheral edema.  Patient was admitted from 1/30-2/2 for acute on chronic systolic CHF.  She was discharged on oral Bumex.  Patient did not follow-up with cardiology after the hospital discharge.  She reports that she did fail her new medicine, Bumex however after she takes Bumex she starts having coughing spells and feels drained out.  Then she drinks a bottle of water which helps her cough.  For the past week patient has been having gradually worsening shortness of breath, swelling in her legs and abdominal distention.  She has orthopnea, states that she feels nauseous on lying down.  Denies any chest pain, abdominal pain, fevers or chills or any productive cough.  Hospital Course: Principal Problem: Acute on chronic combinedsystolicand diastolicCHF (congestive heart failure) (HCC) -Likely worsened due to medical noncompliance, dietary indiscretion, drinking a bottle of water after taking Bumex, elevated BNP with abdominal distention, peripheral edema, dyspnea, orthopnea. 2D echo 11/28/2018 had shown EF of 15% with diffuse hypokinesis, grade 3 diastolic dysfunction.  Cardiology was consulted and followed patient while hospitalized, she was diuresed with IV Lasix with excellent response.  This  was held when creatinine started climbing and he was in addition to Bumex.  She tolerated this well, she is net -4.8 L, fluid status is much improved and she is a on room air, stable.  Have discussed with cardiology, she will be discharged home on oral Bumex.  She is also on Coreg, cardiology recommends to hold ARB/ACE inhibitor as well as spironolactone due to elevated creatinine.  She could not tolerate Entresto in the past with dizziness.  Active Problems:  Nausea and vomiting -Unclear etiology, abdominal x-ray did not show any SBO/ileus or acute pathology,?  Related to gut edema.  Resolved with diuresis and now able to tolerate a regular diet without any discomfort Ischemic cardiomyopathy with history of CAD, PCI in 2015 -Currently no chest pain. Patient reports that she is allergic to aspirin.  Discussed with patient, she states aspirin causes her "mouth to swell " ?  Anaphylaxis. Per cardiology, continue Brilinta, has a history of in-stent thrombosis.  Continue beta-blocker, statin Essential hypertension -BP improving, continue Imdur, low-dose Coreg  CKD (chronic kidney disease), stage III (HCC) -Baseline creatinine 1.2-1.3, slightly higher on discharge but overall stable with diuresis.  She will be followed-up in cardiology office in a few days for repeat BMP Hyperlipidemia -Continue statin Moderate pulmonary hypertension   Discharge Diagnoses:  Principal Problem:   Acute on chronic systolic CHF (congestive heart failure) (HCC) Active Problems:   Ischemic cardiomyopathy   Essential hypertension   CKD (chronic kidney disease), stage III (HCC)   Abdominal distension   Hyperlipidemia   GERD (gastroesophageal reflux disease)   Discharge Instructions  Allergies as of 01/18/2019      Reactions   Aspirin Swelling   Chewable children's aspirin makes patients tongue and face swell   Effient [prasugrel] Swelling  Patient's tongue and face swells   Entresto [sacubitril-valsartan] Other (See  Comments)   dizziness   Lactose Intolerance (gi) Other (See Comments)   REACTION: stomach upset   Robitussin Dm [guaifenesin-dm] Swelling   Patient's tongue swells   Sulfa Antibiotics Swelling   Other    Had to replace "catgut" with clamps      Medication List    STOP taking these medications   metolazone 2.5 MG tablet Commonly known as:  ZAROXOLYN   spironolactone 25 MG tablet Commonly known as:  ALDACTONE     TAKE these medications   atorvastatin 40 MG tablet Commonly known as:  LIPITOR Take 1 tablet (40 mg total) by mouth daily at 6 PM.   bumetanide 2 MG tablet Commonly known as:  BUMEX Take 1 tablet (2 mg total) by mouth daily.   carvedilol 3.125 MG tablet Commonly known as:  COREG Take 1 tablet (3.125 mg total) by mouth 2 (two) times daily with a meal. What changed:    medication strength  how much to take  when to take this  reasons to take this   cetirizine 10 MG tablet Commonly known as:  ZYRTEC Take 10 mg daily by mouth.   cholecalciferol 1000 units tablet Commonly known as:  VITAMIN D Take 1,000 Units by mouth every 14 (fourteen) days. Twice a month   fluticasone 50 MCG/ACT nasal spray Commonly known as:  FLONASE Place 1 spray into both nostrils daily as needed for allergies (congestion).   IRON PO Take 1 tablet by mouth 3 (three) times a week. Take on MWF   isosorbide mononitrate 30 MG 24 hr tablet Commonly known as:  IMDUR Take 1 tablet (30 mg total) by mouth daily.   magnesium oxide 400 (241.3 Mg) MG tablet Commonly known as:  MAG-OX Take 400 mg by mouth every 14 (fourteen) days. Twice a month   nitroGLYCERIN 0.4 MG SL tablet Commonly known as:  NITROSTAT Place 0.4 mg under the tongue every 5 (five) minutes as needed for chest pain.   potassium chloride SA 20 MEQ tablet Commonly known as:  K-DUR,KLOR-CON Take 1 tablet (20 mEq total) by mouth daily.   ticagrelor 60 MG Tabs tablet Commonly known as:  BRILINTA Take 1 tablet (60 mg  total) by mouth 2 (two) times daily.      Follow-up Information    [facility] Follow up.   Specialty:  Cardiology Why:  You have a hospital follow-up visit at the heart failure clinic scheduled for 01/26/2019 at 2:30pm. Please arrive 15 minutes early for check-in. Contact information: [address] [id number] [place] [id number] [phone]       [facility] Follow up.   Specialty:  Cardiology Why:  Please come by our [facility] office on Friday 01/20/2019 for a lab visit so that we can recheck your kidney function. You can come by anytime from 7:30am to 4pm.  Contact information: [address] [place] [id number] [phone]          Consultations:  Cardiology  Procedures/Studies:  Dg Chest 2 View  Result Date: 01/13/2019 CLINICAL DATA:  Shortness of breath.  Cough. EXAM: CHEST - 2 VIEW COMPARISON:  11/18/2018 and 03/10/2018 FINDINGS: Stable cardiomegaly. Prominent central vascular markings and prominent interstitial markings are similar to prior examinations. Slightly increased densities at the right lung base could represent atelectasis and small right pleural effusion. No acute bone abnormality. IMPRESSION: 1. Stable cardiomegaly  with probable chronic vascular congestion. 2. Blunting at the right costophrenic angle is suggestive for a small right pleural effusion and/or atelectasis. Electronically Signed   By: Richarda Overlie M.D.   On: 01/13/2019 16:41   US Abdomen Limited  Result Date: 01/13/2019 CLINICAL DATA:  68 year old female with abdominal distention. Evaluate for ascites. EXAM: LIMITED ABDOMEN ULTRASOUND FOR ASCITES TECHNIQUE: Limited ultrasound survey for ascites was performed in all four abdominal quadrants. COMPARISON:  CT of the abdomen pelvis dated 12/15/2018 FINDINGS: Targeted evaluation of the 4 quadrants of the abdomen performed. No significant free fluid  noted. IMPRESSION: No ascites. Electronically Signed   By: Elgie Collard M.D.   On: 01/13/2019 22:11   Dg Abd Portable 2v  Result Date: 01/17/2019 CLINICAL DATA:  Approximate one-week history of persistent generalized abdominal pain, nausea and vomiting. EXAM: PORTABLE ABDOMEN - 2 VIEW COMPARISON:  CT abdomen and pelvis 12/15/2018. Acute abdomen series 03/26/2017. FINDINGS: Portable AP supine and LEFT lateral decubitus images were obtained. Bowel gas pattern unremarkable without evidence of obstruction or significant ileus. No evidence of free air or significant air-fluid levels on the lateral decubitus image. Expected stool burden in the colon. No visible opaque urinary tract calculi. Degenerative changes involving the lumbar spine. IMPRESSION: No acute abdominal abnormality. Electronically Signed   By: Hulan Saas M.D.   On: 01/17/2019 11:51      Subjective: - no chest pain, shortness of breath, no abdominal pain, nausea or vomiting.   Discharge Exam: BP 131/79 (BP Location: Left Arm)   Pulse 78   Temp 98.3 F (36.8 C) (Oral)   Resp 18   Ht 5' 5.5" (1.664 m)   Wt 97 kg   SpO2 98%   BMI 35.04 kg/m   General: Pt is alert, awake, not in acute distress Cardiovascular: RRR, S1/S2 +, no rubs, no gallops Respiratory: CTA bilaterally, no wheezing, no rhonchi Abdominal: Soft, NT, ND, bowel sounds + Extremities: no edema, no cyanosis    The results of significant diagnostics from this hospitalization (including imaging, microbiology, ancillary and laboratory) are listed below for reference.     Microbiology: No results found for this or any previous visit (from the past 240 hour(s)).   Labs: BNP (last 3 results) Recent Labs    11/27/18 1607 12/15/18 2022 01/13/19 1603  BNP 1,435.0* 1,850.9* 1,888.6*   Basic Metabolic Panel: Recent Labs  Lab 01/14/19 0034 01/15/19 0450 01/16/19 0400 01/17/19 0443 01/18/19 0433  NA 142 140 139 139 140  K 3.4* 3.7 4.0 4.3 4.4  CL  107 105 105 108 107  CO2 24 26 24  20* 22  GLUCOSE 138* 99 107* 99 103*  BUN 21 34* 36* 32* 27*  CREATININE 1.42* 1.82* 1.72* 1.51* 1.62*  CALCIUM 8.9 8.9 9.3 9.3 9.3   Liver Function Tests: No results for input(s): AST, ALT, ALKPHOS, BILITOT, PROT, ALBUMIN in the last 168 hours. No results for input(s): LIPASE, AMYLASE in the last 168 hours. No results for input(s): AMMONIA in the last 168 hours. CBC: Recent Labs  Lab 01/13/19 1603 01/13/19 1901  WBC 7.9 8.1  HGB 14.2 14.6  HCT 47.0* 49.6*  MCV 95.3 96.3  PLT 244 269   Cardiac Enzymes: Recent Labs  Lab 01/13/19 1901 01/14/19 0034 01/14/19 0644  TROPONINI <0.03 <0.03 <0.03   BNP: Invalid input(s): POCBNP CBG: No results for input(s): GLUCAP in the last 168 hours. D-Dimer No results for input(s): DDIMER in the last 72 hours. Hgb A1c No results for input(s): HGBA1C  in the last 72 hours. Lipid Profile No results for input(s): CHOL, HDL, LDLCALC, TRIG, CHOLHDL, LDLDIRECT in the last 72 hours. Thyroid function studies No results for input(s): TSH, T4TOTAL, T3FREE, THYROIDAB in the last 72 hours.  Invalid input(s): FREET3 Anemia work up No results for input(s): VITAMINB12, FOLATE, FERRITIN, TIBC, IRON, RETICCTPCT in the last 72 hours. Urinalysis    Component Value Date/Time   COLORURINE YELLOW 12/16/2018 0024   APPEARANCEUR CLEAR 12/16/2018 0024   LABSPEC 1.034 (H) 12/16/2018 0024   PHURINE 5.0 12/16/2018 0024   GLUCOSEU NEGATIVE 12/16/2018 0024   HGBUR NEGATIVE 12/16/2018 0024   BILIRUBINUR NEGATIVE 12/16/2018 0024   KETONESUR NEGATIVE 12/16/2018 0024   PROTEINUR 100 (A) 12/16/2018 0024   UROBILINOGEN 1.0 07/25/2015 1601   NITRITE NEGATIVE 12/16/2018 0024   LEUKOCYTESUR NEGATIVE 12/16/2018 0024   Sepsis Labs Invalid input(s): PROCALCITONIN,  WBC,  LACTICIDVEN  FURTHER DISCHARGE INSTRUCTIONS:   Get Medicines reviewed and adjusted: Please take all your medications with you for your next visit with your  Primary MD   Laboratory/radiological data: Please request your Primary MD to go over all hospital tests and procedure/radiological results at the follow up, please ask your Primary MD to get all Hospital records sent to his/her office.   In some cases, they will be blood work, cultures and biopsy results pending at the time of your discharge. Please request that your primary care M.D. goes through all the records of your hospital data and follows up on these results.   Also Note the following: If you experience worsening of your admission symptoms, develop shortness of breath, life threatening emergency, suicidal or homicidal thoughts you must seek medical attention immediately by calling 911 or calling your MD immediately  if symptoms less severe.   You must read complete instructions/literature along with all the possible adverse reactions/side effects for all the Medicines you take and that have been prescribed to you. Take any new Medicines after you have completely understood and accpet all the possible adverse reactions/side effects.    Do not drive when taking Pain medications or sleeping medications (Benzodaizepines)   Do not take more than prescribed Pain, Sleep and Anxiety Medications. It is not advisable to combine anxiety,sleep and pain medications without talking with your primary care practitioner   Special Instructions: If you have smoked or chewed Tobacco  in the last 2 yrs please stop smoking, stop any regular Alcohol  and or any Recreational drug use.   Wear Seat belts while driving.   Please note: You were cared for by a hospitalist during your hospital stay. Once you are discharged, your primary care physician will handle any further medical issues. Please note that NO REFILLS for any discharge medications will be authorized once you are discharged, as it is imperative that you return to your primary care physician (or establish a relationship with a primary care physician if  you do not have one) for your post hospital discharge needs so that they can reassess your need for medications and monitor your lab values.  Time coordinating discharge: 40 minutes  SIGNED:  Pamella Pert, PA-S 01/18/2019, 3:22 PM

## 2019-01-18 NOTE — Discharge Instructions (Signed)
Follow with [facility] in 5-7 days Follow up with CHF clinic as scheduled   Please get a complete blood count and chemistry panel checked by your Primary MD at your next visit, and again as instructed by your Primary MD. Please get your medications reviewed and adjusted by your Primary MD.  Please request your Primary MD to go over all Hospital Tests and Procedure/Radiological results at the follow up, please get all Hospital records sent to your Prim MD by signing hospital release before you go home.  In some cases, there will be blood work, cultures and biopsy results pending at the time of your discharge. Please request that your primary care M.D. goes through all the records of your hospital data and follows up on these results.  If you had Pneumonia of Lung problems at the Hospital: Please get a 2 view Chest X ray done in 6-8 weeks after hospital discharge or sooner if instructed by your Primary MD.  If you have Congestive Heart Failure: Please call your Cardiologist or Primary MD anytime you have any of the following symptoms:  1) 3 pound weight gain in 24 hours or 5 pounds in 1 week  2) shortness of breath, with or without a dry hacking cough  3) swelling in the hands, feet or stomach  4) if you have to sleep on extra pillows at night in order to breathe  Follow cardiac low salt diet and 1.5 lit/day fluid restriction.  If you have diabetes Accuchecks 4 times/day, Once in AM empty stomach and then before each meal. Log in all results and show them to your primary doctor at your next visit. If any glucose reading is under 80 or above 300 call your primary MD immediately.  If you have Seizure/Convulsions/Epilepsy: Please do not drive, operate heavy machinery, participate in activities at heights or participate in high speed sports until you have seen by Primary MD or a Neurologist and advised to do so again.  If you had Gastrointestinal Bleeding: Please  ask your Primary MD to check a complete blood count within one week of discharge or at your next visit. Your endoscopic/colonoscopic biopsies that are pending at the time of discharge, will also need to followed by your Primary MD.  Get Medicines reviewed and adjusted. Please take all your medications with you for your next visit with your Primary MD  Please request your Primary MD to go over all hospital tests and procedure/radiological results at the follow up, please ask your Primary MD to get all Hospital records sent to his/her office.  If you experience worsening of your admission symptoms, develop shortness of breath, life threatening emergency, suicidal or homicidal thoughts you must seek medical attention immediately by calling 911 or calling your MD immediately  if symptoms less severe.  You must read complete instructions/literature along with all the possible adverse reactions/side effects for all the Medicines you take and that have been prescribed to you. Take any new Medicines after you have completely understood and accpet all the possible adverse reactions/side effects.   Do not drive or operate heavy machinery when taking Pain medications.   Do not take more than prescribed Pain, Sleep and Anxiety Medications  Special Instructions: If you have smoked or chewed Tobacco  in the last 2 yrs please stop smoking, stop any regular Alcohol  and or any Recreational drug use.  Wear Seat belts while driving.  Please note You were cared for by a hospitalist during  your hospital stay. If you have any questions about your discharge medications or the care you received while you were in the hospital after you are discharged, you can call the unit and asked to speak with the hospitalist on call if the hospitalist that took care of you is not available. Once you are discharged, your primary care physician will handle any further medical issues. Please note that NO REFILLS for any discharge  medications will be authorized once you are discharged, as it is imperative that you return to your primary care physician (or establish a relationship with a primary care physician if you do not have one) for your aftercare needs so that they can reassess your need for medications and monitor your lab values.  You can reach the hospitalist office at phone [phone] or fax [phone]   If you do not have a primary care physician, you can call [phone] for a physician referral.  Activity: As tolerated with Full fall precautions use walker/cane & assistance as needed    Diet: low sodium < 2000 mg daily, fluid restriction < 2L daily  Disposition Home

## 2019-01-18 NOTE — Care Management Note (Signed)
Case Management Note  Patient Details  Name: Deborah Ho MRN: [id number] Date of Birth: 11-14-1951  Subjective/Objective:                    Action/Plan:Pt was active with [facility] and will continue with [facility] at home.    Expected Discharge Date:  01/18/19               Expected Discharge Plan:  Home w Home Health Services  In-House Referral:     Discharge planning Services  CM Consult  Post Acute Care Choice:    Choice offered to:     DME Arranged:    DME Agency:     HH Arranged:  RN, Social Work [employer] Agency:  [facility]  Status of Service:  In process, will continue to follow  If discussed at Long Length of Stay Meetings, dates discussed:    Additional CommentsGeni Bers, RN 01/18/2019, 1:28 PM

## 2019-01-18 NOTE — Progress Notes (Signed)
Progress Note  Patient Name: Deborah Ho Date of Encounter: 01/18/2019  Primary Cardiologist: Peter [place], MD   Subjective   No significant overnight events. Patient states she is feeling much better today compared to yesterday. No more nausea or vomiting. She has no complaints at this time. She seems to have tolerated the Bumex well.  Inpatient Medications    Scheduled Meds: . atorvastatin  40 mg Oral q1800  . benzonatate  100 mg Oral TID  . bumetanide  2 mg Oral Daily  . carvedilol  3.125 mg Oral BID WC  . heparin  5,000 Units Subcutaneous Q8H  . isosorbide mononitrate  30 mg Oral Daily  . loratadine  10 mg Oral Daily  . potassium chloride SA  40 mEq Oral Daily  . ENSURE MAX PROTEIN  11 oz Oral Daily  . sodium chloride flush  3 mL Intravenous Q12H  . ticagrelor  60 mg Oral BID   Continuous Infusions: . sodium chloride     PRN Meds: sodium chloride, acetaminophen, diphenhydrAMINE-zinc acetate, fluticasone, promethazine, sodium chloride flush   Vital Signs    Vitals:   01/17/19 2009 01/18/19 0547 01/18/19 0600 01/18/19 0842  BP: 134/86 (!) 135/91  137/87  Pulse: 81 75  83  Resp: 20 20    Temp: 98.3 F (36.8 C) 98.3 F (36.8 C)    TempSrc: Oral Oral    SpO2: 95% 99%    Weight:   97 kg   Height:        Intake/Output Summary (Last 24 hours) at 01/18/2019 1004 Last data filed at 01/18/2019 0600 Gross per 24 hour  Intake -  Output 1402 ml  Net -1402 ml   Filed Weights   01/16/19 0500 01/17/19 0524 01/18/19 0600  Weight: 98.2 kg 98.4 kg 97 kg    Telemetry    Sinus rhythm with heart rates in the 70's to 90's.   - Personally Reviewed  ECG    No new ECG tracing since 01/14/2019. - Personally Reviewed  Physical Exam   GEN: Obese African-American female resting comfortably. Alert and in no acute distress.   Neck: Supple.  Cardiac: RRR. No murmurs, gallops, or rubs. Radial and distal-pedal pulses 2+ and equal bilaterally. Respiratory: Clear to  auscultation bilaterally. No wheezes, rhonchi, or rales. GI: Abdomen soft, mildly distended, and non-tender. Bowel sounds present. MS: Trace lower extremity edema. No deformity. Skin: Warm and dry. Neuro:  No focal deficits. Psych: Normal affect. Responds appropriately.  Labs    Chemistry Recent Labs  Lab 01/16/19 0400 01/17/19 0443 01/18/19 0433  NA 139 139 140  K 4.0 4.3 4.4  CL 105 108 107  CO2 24 20* 22  GLUCOSE 107* 99 103*  BUN 36* 32* 27*  CREATININE 1.72* 1.51* 1.62*  CALCIUM 9.3 9.3 9.3  GFRNONAA 30* 35* 33*  GFRAA 35* 41* 38*  ANIONGAP 10 11 11      Hematology Recent Labs  Lab 01/13/19 1603 01/13/19 1901  WBC 7.9 8.1  RBC 4.93 5.15*  HGB 14.2 14.6  HCT 47.0* 49.6*  MCV 95.3 96.3  MCH 28.8 28.3  MCHC 30.2 29.4*  RDW 14.8 14.7  PLT 244 269    Cardiac Enzymes Recent Labs  Lab 01/13/19 1901 01/14/19 0034 01/14/19 0644  TROPONINI <0.03 <0.03 <0.03   No results for input(s): TROPIPOC in the last 168 hours.   BNP Recent Labs  Lab 01/13/19 1603  BNP 1,888.6*     DDimer No results for input(s): DDIMER in  the last 168 hours.   Radiology    Dg Abd Portable 2v  Result Date: 01/17/2019 CLINICAL DATA:  Approximate one-week history of persistent generalized abdominal pain, nausea and vomiting. EXAM: PORTABLE ABDOMEN - 2 VIEW COMPARISON:  CT abdomen and pelvis 12/15/2018. Acute abdomen series 03/26/2017. FINDINGS: Portable AP supine and LEFT lateral decubitus images were obtained. Bowel gas pattern unremarkable without evidence of obstruction or significant ileus. No evidence of free air or significant air-fluid levels on the lateral decubitus image. Expected stool burden in the colon. No visible opaque urinary tract calculi. Degenerative changes involving the lumbar spine. IMPRESSION: No acute abdominal abnormality. Electronically Signed   By: Hulan Saas M.D.   On: 01/17/2019 11:51    Cardiac Studies   Echocardiogram 11/28/2018: Study  Conclusions: - Left ventricle: The cavity size was normal. Wall thickness was   increased in a pattern of mild LVH. The estimated ejection   fraction was 15%. Diffuse hypokinesis. No mural thrombus by   Definity contrast. Doppler parameters are consistent with   restrictive left ventricular relaxation (grade 3 diastolic   dysfunction). The E/e&' ratio is >20, suggesting markedly elevated   LV filling pressure. - Mitral valve: Mildly thickened leaflets . There was moderate   regurgitation. - Left atrium: The atrium was normal in size. - Right ventricle: The cavity size was moderately dilated. Mild   systolic dysfunction. - Right atrium: Severely dilated. - Tricuspid valve: There was moderate regurgitation. - Pulmonary arteries: PA peak pressure: 53 mm Hg (S). - Inferior vena cava: The vessel was dilated. The respirophasic   diameter changes were blunted (< 50%), consistent with elevated   central venous pressure.  Impressions: - Compared to a prior study in 11/2017, the LVEF is unchanged at   15%. Left and right heart pressures are quite elevated.  Patient Profile   Deborah Ho is a 68 y.o. female with a history of anterior STEMI in 2015 with early re-stent thrombosis due to missing doses of Brilinta which was then treated with DES x2 to the LAD, chronic combined systolic and diastolic CHF, hypertension, hyperlipidemia, and tobacco use who is being seen today for evaluation of acute on chronic CHF. Patient has a history of non-compliance and has failed to follow-up with the [facility] since 11/2017. She had been on Torsemide 80mg  twice daily at that time but reportedly stopped taking this due to mucus in her chest. Patient was discharged from most recent hospitalization (12/15/2018 to 12/18/2018) on Bumex but stopped taking it due to side effects (cough and dry mouth/throat)  Assessment & Plan    Acute on Chronic Combined CHF - Echo from 11/28/2018 showed LVEF of 15% with  diffuse hypokinesis and grade 3 diastolic dysfunction. - Documented output of 1.4 L in the past 24 hours with net negative 4.8 L since admission. - Restarted Bumex 2mg  daily yesterday. Serum creatinine increased slightly from 1.51 yesterday to 1.62 today.  - Patient previously on Entresto but had dizziness with this and has refused to try again. - Continue Coreg 3.125mg  twice day. Would up-titrate as able.  - Continue Imdur 30mg  daily.  - Continue to hold ACEi, ARB, and Spironolactone due to renal function.  - Continue fluid restriction to <2 L daily and sodium restriction < 2,000 mg daily. Patient received CHF diet education from dietician yesterday. - Patient can likely be discharged today. MD to see. Will need repeat BMET in a couple of days as well as close follow-up.  - Patient  ultimately needs to get back into the [facility]. She may benefit from a right heart catheterization at some point.   Moderate Pulmonary Hypertension - Moderate on Echo in 11/2018 with PASP of 53 mmHg. This is significantly higher than Echo from 11/2017 when PASP was 31 mmHg. Right ventricle  was also noted to be moderately dilated with mild dysfunction.  CAD  - Patient denies any chest pain. - Continue Brilinta, beta-blocker, and high-intensity statin.  - Patient has Aspirin listed as an allergy - reportedly causes her tongue and face to swell.   Moderate Mitral Regurgitation - Likely related to annular dilatation from severe left ventricular dysfunction. - Continue heart failure medications.   Hypertension - Most recent BP 137/87.  - Continue Coreg as above.   For questions or updates, please contact [facility] Please consult [email or web address] for contact info under Cardiology/STEMI.      Signed, Corrin Parker, PA-C  01/18/2019, 10:04 AM

## 2019-01-19 ENCOUNTER — Telehealth: Payer: Self-pay | Admitting: Physician Assistant

## 2019-01-19 NOTE — Telephone Encounter (Signed)
New message:    [facility]line Asp [facility] calling to get verbable orders for the patient [phone]

## 2019-01-23 ENCOUNTER — Telehealth: Payer: Self-pay | Admitting: Cardiology

## 2019-01-23 NOTE — Telephone Encounter (Signed)
  Deborah Ho from [facility] is calling to make [place] aware that she has been unable to locate Ms Aldana since she left the hospital. She has tried to call with no answer and also went by her apartment and had a wellness check done but no one knows where she is.

## 2019-01-24 NOTE — Telephone Encounter (Signed)
I will make Dr.Jordan aware. 

## 2019-01-26 ENCOUNTER — Encounter (HOSPITAL_COMMUNITY): Payer: Medicare Other

## 2019-02-24 ENCOUNTER — Telehealth: Payer: Self-pay | Admitting: Cardiology

## 2019-02-24 NOTE — Telephone Encounter (Signed)
Pt has h/o noncompliance w/meds and appts.  She has no showed to her last several appts with our office, will reach out and try to sch a virtual visit if pt is agreeable.

## 2019-02-24 NOTE — Telephone Encounter (Signed)
Spoke with pt for quit some time and main conversation was about what meds pt was suppose to take Instructed pt to take meds as listed on last discharge summary and also pt is over due for appt with CHF clinic encouraged pt to contact office and make and appt .Pt agrees and verbalizes understanding .Deborah Ho

## 2019-02-24 NOTE — Telephone Encounter (Signed)
New Message   Patient would like to speak to a nurse about her medications the doctor at [facility] just prescribed her.

## 2019-03-01 NOTE — Telephone Encounter (Signed)
Patient well known to the [facility] with history of noncompliance.  I attempted to contact patient with no ability to leave message.

## 2019-04-19 ENCOUNTER — Emergency Department (HOSPITAL_COMMUNITY): Payer: Medicare Other

## 2019-04-19 ENCOUNTER — Other Ambulatory Visit: Payer: Self-pay

## 2019-04-19 ENCOUNTER — Encounter (HOSPITAL_COMMUNITY): Payer: Self-pay | Admitting: Emergency Medicine

## 2019-04-19 ENCOUNTER — Inpatient Hospital Stay (HOSPITAL_COMMUNITY)
Admission: EM | Admit: 2019-04-19 | Discharge: 2019-04-28 | DRG: 291 | Disposition: A | Discharge status: Home / Self Care | Payer: Medicare Other | Attending: Cardiology | Admitting: Cardiology

## 2019-04-19 DIAGNOSIS — I5023 Acute on chronic systolic (congestive) heart failure: Secondary | ICD-10-CM | POA: Diagnosis present

## 2019-04-19 DIAGNOSIS — R188 Other ascites: Secondary | ICD-10-CM

## 2019-04-19 DIAGNOSIS — E785 Hyperlipidemia, unspecified: Secondary | ICD-10-CM | POA: Diagnosis present

## 2019-04-19 DIAGNOSIS — I493 Ventricular premature depolarization: Secondary | ICD-10-CM | POA: Diagnosis present

## 2019-04-19 DIAGNOSIS — Z955 Presence of coronary angioplasty implant and graft: Secondary | ICD-10-CM

## 2019-04-19 DIAGNOSIS — Z7902 Long term (current) use of antithrombotics/antiplatelets: Secondary | ICD-10-CM

## 2019-04-19 DIAGNOSIS — I272 Pulmonary hypertension, unspecified: Secondary | ICD-10-CM | POA: Diagnosis present

## 2019-04-19 DIAGNOSIS — I10 Essential (primary) hypertension: Secondary | ICD-10-CM | POA: Diagnosis present

## 2019-04-19 DIAGNOSIS — R600 Localized edema: Secondary | ICD-10-CM

## 2019-04-19 DIAGNOSIS — I252 Old myocardial infarction: Secondary | ICD-10-CM

## 2019-04-19 DIAGNOSIS — Z6841 Body Mass Index (BMI) 40.0 and over, adult: Secondary | ICD-10-CM

## 2019-04-19 DIAGNOSIS — I2510 Atherosclerotic heart disease of native coronary artery without angina pectoris: Secondary | ICD-10-CM | POA: Diagnosis present

## 2019-04-19 DIAGNOSIS — R609 Edema, unspecified: Secondary | ICD-10-CM

## 2019-04-19 DIAGNOSIS — I5021 Acute systolic (congestive) heart failure: Secondary | ICD-10-CM

## 2019-04-19 DIAGNOSIS — F17210 Nicotine dependence, cigarettes, uncomplicated: Secondary | ICD-10-CM | POA: Diagnosis present

## 2019-04-19 DIAGNOSIS — I509 Heart failure, unspecified: Secondary | ICD-10-CM

## 2019-04-19 DIAGNOSIS — Z833 Family history of diabetes mellitus: Secondary | ICD-10-CM

## 2019-04-19 DIAGNOSIS — Z8249 Family history of ischemic heart disease and other diseases of the circulatory system: Secondary | ICD-10-CM

## 2019-04-19 DIAGNOSIS — Z886 Allergy status to analgesic agent status: Secondary | ICD-10-CM

## 2019-04-19 DIAGNOSIS — Z20828 Contact with and (suspected) exposure to other viral communicable diseases: Secondary | ICD-10-CM | POA: Diagnosis present

## 2019-04-19 DIAGNOSIS — N183 Chronic kidney disease, stage 3 unspecified: Secondary | ICD-10-CM | POA: Diagnosis present

## 2019-04-19 DIAGNOSIS — I130 Hypertensive heart and chronic kidney disease with heart failure and stage 1 through stage 4 chronic kidney disease, or unspecified chronic kidney disease: Secondary | ICD-10-CM | POA: Diagnosis not present

## 2019-04-19 DIAGNOSIS — I255 Ischemic cardiomyopathy: Secondary | ICD-10-CM | POA: Diagnosis present

## 2019-04-19 DIAGNOSIS — Z9119 Patient's noncompliance with other medical treatment and regimen: Secondary | ICD-10-CM

## 2019-04-19 DIAGNOSIS — I5043 Acute on chronic combined systolic (congestive) and diastolic (congestive) heart failure: Secondary | ICD-10-CM | POA: Diagnosis present

## 2019-04-19 NOTE — ED Triage Notes (Signed)
Pt reports that she has been having bilateral leg swelling x 1 week, and abdominal swelling x 3 days. C/o shortness of breath with exertion. States she was recently taken off her furosemide. Denies chest pain.

## 2019-04-20 ENCOUNTER — Other Ambulatory Visit: Payer: Self-pay

## 2019-04-20 ENCOUNTER — Encounter (HOSPITAL_COMMUNITY): Payer: Self-pay | Admitting: Internal Medicine

## 2019-04-20 DIAGNOSIS — Z955 Presence of coronary angioplasty implant and graft: Secondary | ICD-10-CM | POA: Diagnosis not present

## 2019-04-20 DIAGNOSIS — I2510 Atherosclerotic heart disease of native coronary artery without angina pectoris: Secondary | ICD-10-CM | POA: Diagnosis present

## 2019-04-20 DIAGNOSIS — I252 Old myocardial infarction: Secondary | ICD-10-CM | POA: Diagnosis not present

## 2019-04-20 DIAGNOSIS — F17210 Nicotine dependence, cigarettes, uncomplicated: Secondary | ICD-10-CM | POA: Diagnosis present

## 2019-04-20 DIAGNOSIS — Z6841 Body Mass Index (BMI) 40.0 and over, adult: Secondary | ICD-10-CM | POA: Diagnosis not present

## 2019-04-20 DIAGNOSIS — I10 Essential (primary) hypertension: Secondary | ICD-10-CM | POA: Diagnosis not present

## 2019-04-20 DIAGNOSIS — Z20828 Contact with and (suspected) exposure to other viral communicable diseases: Secondary | ICD-10-CM | POA: Diagnosis present

## 2019-04-20 DIAGNOSIS — Z833 Family history of diabetes mellitus: Secondary | ICD-10-CM | POA: Diagnosis not present

## 2019-04-20 DIAGNOSIS — I493 Ventricular premature depolarization: Secondary | ICD-10-CM | POA: Diagnosis present

## 2019-04-20 DIAGNOSIS — R609 Edema, unspecified: Secondary | ICD-10-CM | POA: Diagnosis present

## 2019-04-20 DIAGNOSIS — Z9119 Patient's noncompliance with other medical treatment and regimen: Secondary | ICD-10-CM | POA: Diagnosis not present

## 2019-04-20 DIAGNOSIS — N183 Chronic kidney disease, stage 3 (moderate): Secondary | ICD-10-CM | POA: Diagnosis present

## 2019-04-20 DIAGNOSIS — I5023 Acute on chronic systolic (congestive) heart failure: Secondary | ICD-10-CM

## 2019-04-20 DIAGNOSIS — Z8249 Family history of ischemic heart disease and other diseases of the circulatory system: Secondary | ICD-10-CM | POA: Diagnosis not present

## 2019-04-20 DIAGNOSIS — E785 Hyperlipidemia, unspecified: Secondary | ICD-10-CM | POA: Diagnosis present

## 2019-04-20 DIAGNOSIS — R601 Generalized edema: Secondary | ICD-10-CM | POA: Diagnosis not present

## 2019-04-20 DIAGNOSIS — I255 Ischemic cardiomyopathy: Secondary | ICD-10-CM | POA: Diagnosis present

## 2019-04-20 DIAGNOSIS — Z886 Allergy status to analgesic agent status: Secondary | ICD-10-CM | POA: Diagnosis not present

## 2019-04-20 DIAGNOSIS — I272 Pulmonary hypertension, unspecified: Secondary | ICD-10-CM | POA: Diagnosis present

## 2019-04-20 DIAGNOSIS — I5043 Acute on chronic combined systolic (congestive) and diastolic (congestive) heart failure: Secondary | ICD-10-CM | POA: Diagnosis present

## 2019-04-20 DIAGNOSIS — I420 Dilated cardiomyopathy: Secondary | ICD-10-CM | POA: Diagnosis not present

## 2019-04-20 DIAGNOSIS — R188 Other ascites: Secondary | ICD-10-CM | POA: Diagnosis present

## 2019-04-20 DIAGNOSIS — Z7902 Long term (current) use of antithrombotics/antiplatelets: Secondary | ICD-10-CM | POA: Diagnosis not present

## 2019-04-20 DIAGNOSIS — I130 Hypertensive heart and chronic kidney disease with heart failure and stage 1 through stage 4 chronic kidney disease, or unspecified chronic kidney disease: Secondary | ICD-10-CM | POA: Diagnosis present

## 2019-04-20 LAB — CBC WITH DIFFERENTIAL/PLATELET
Abs Immature Granulocytes: 0.02 10*3/uL (ref 0.00–0.07)
Basophils Absolute: 0.1 10*3/uL (ref 0.0–0.1)
Basophils Relative: 1 %
Eosinophils Absolute: 0.1 10*3/uL (ref 0.0–0.5)
Eosinophils Relative: 2 %
HCT: 51.7 % — ABNORMAL HIGH (ref 36.0–46.0)
Hemoglobin: 15.5 g/dL — ABNORMAL HIGH (ref 12.0–15.0)
Immature Granulocytes: 0 %
Lymphocytes Relative: 20 %
Lymphs Abs: 1.3 10*3/uL (ref 0.7–4.0)
MCH: 27 pg (ref 26.0–34.0)
MCHC: 30 g/dL (ref 30.0–36.0)
MCV: 90.1 fL (ref 80.0–100.0)
Monocytes Absolute: 0.8 10*3/uL (ref 0.1–1.0)
Monocytes Relative: 12 %
Neutro Abs: 4.3 10*3/uL (ref 1.7–7.7)
Neutrophils Relative %: 65 %
Platelets: 267 10*3/uL (ref 150–400)
RBC: 5.74 MIL/uL — ABNORMAL HIGH (ref 3.87–5.11)
RDW: 18.6 % — ABNORMAL HIGH (ref 11.5–15.5)
WBC: 6.6 10*3/uL (ref 4.0–10.5)
nRBC: 0 % (ref 0.0–0.2)

## 2019-04-20 LAB — COMPREHENSIVE METABOLIC PANEL
ALT: 12 U/L (ref 0–44)
AST: 24 U/L (ref 15–41)
Albumin: 3.6 g/dL (ref 3.5–5.0)
Alkaline Phosphatase: 176 U/L — ABNORMAL HIGH (ref 38–126)
Anion gap: 13 (ref 5–15)
BUN: 19 mg/dL (ref 8–23)
CO2: 24 mmol/L (ref 22–32)
Calcium: 9.8 mg/dL (ref 8.9–10.3)
Chloride: 105 mmol/L (ref 98–111)
Creatinine, Ser: 1.59 mg/dL — ABNORMAL HIGH (ref 0.44–1.00)
GFR calc Af Amer: 39 mL/min — ABNORMAL LOW (ref >60)
GFR calc non Af Amer: 33 mL/min — ABNORMAL LOW (ref >60)
Glucose, Bld: 95 mg/dL (ref 70–99)
Potassium: 4.6 mmol/L (ref 3.5–5.1)
Sodium: 142 mmol/L (ref 135–145)
Total Bilirubin: 3 mg/dL — ABNORMAL HIGH (ref 0.3–1.2)
Total Protein: 7.4 g/dL (ref 6.5–8.1)

## 2019-04-20 LAB — TROPONIN I
Troponin I: <0.03 ng/mL (ref <0.03)
Troponin I: <0.03 ng/mL (ref <0.03)
Troponin I: <0.03 ng/mL (ref <0.03)

## 2019-04-20 LAB — BRAIN NATRIURETIC PEPTIDE: B Natriuretic Peptide: 3289 pg/mL — ABNORMAL HIGH (ref 0.0–100.0)

## 2019-04-20 LAB — SARS CORONAVIRUS 2 BY RT PCR (HOSPITAL ORDER, PERFORMED IN ~~LOC~~ HOSPITAL LAB): SARS Coronavirus 2: NEGATIVE

## 2019-04-20 MED ORDER — ATORVASTATIN CALCIUM 40 MG PO TABS
40.0000 mg | ORAL_TABLET | Freq: Every day | ORAL | Status: DC
  Administered 2019-04-20 – 2019-04-21 (×2): 40 mg via ORAL
  Filled 2019-04-20 (×2): qty 1

## 2019-04-20 MED ORDER — TICAGRELOR 60 MG PO TABS
60.0000 mg | ORAL_TABLET | Freq: Two times a day (BID) | ORAL | Status: DC
  Administered 2019-04-20 – 2019-04-28 (×17): 60 mg via ORAL
  Filled 2019-04-20 (×18): qty 1

## 2019-04-20 MED ORDER — ISOSORBIDE MONONITRATE ER 30 MG PO TB24
30.0000 mg | ORAL_TABLET | Freq: Every day | ORAL | Status: DC
  Administered 2019-04-20 – 2019-04-23 (×4): 30 mg via ORAL
  Filled 2019-04-20 (×4): qty 1

## 2019-04-20 MED ORDER — MAGNESIUM OXIDE 400 (241.3 MG) MG PO TABS
400.0000 mg | ORAL_TABLET | ORAL | Status: DC
  Administered 2019-04-20: 09:00:00 400 mg via ORAL
  Filled 2019-04-20 (×2): qty 1

## 2019-04-20 MED ORDER — POTASSIUM CHLORIDE CRYS ER 20 MEQ PO TBCR
20.0000 meq | EXTENDED_RELEASE_TABLET | Freq: Every day | ORAL | Status: DC
  Administered 2019-04-20 – 2019-04-28 (×9): 20 meq via ORAL
  Filled 2019-04-20 (×9): qty 1

## 2019-04-20 MED ORDER — FUROSEMIDE 10 MG/ML IJ SOLN
80.0000 mg | Freq: Two times a day (BID) | INTRAMUSCULAR | Status: DC
  Administered 2019-04-20 – 2019-04-23 (×7): 80 mg via INTRAVENOUS
  Filled 2019-04-20 (×7): qty 8

## 2019-04-20 MED ORDER — BENZONATATE 100 MG PO CAPS
100.0000 mg | ORAL_CAPSULE | Freq: Three times a day (TID) | ORAL | Status: DC | PRN
  Administered 2019-04-20 – 2019-04-22 (×6): 100 mg via ORAL
  Filled 2019-04-20 (×6): qty 1

## 2019-04-20 MED ORDER — ONDANSETRON HCL 4 MG PO TABS: 4.0000 mg | ORAL_TABLET | Freq: Four times a day (QID) | ORAL | Status: DC | PRN

## 2019-04-20 MED ORDER — ACETAMINOPHEN 325 MG PO TABS
650.0000 mg | ORAL_TABLET | Freq: Four times a day (QID) | ORAL | Status: DC | PRN
  Filled 2019-04-20: qty 2

## 2019-04-20 MED ORDER — FENTANYL CITRATE (PF) 100 MCG/2ML IJ SOLN
100.0000 ug | Freq: Once | INTRAMUSCULAR | Status: AC
  Administered 2019-04-20: 03:00:00 100 ug via INTRAVENOUS
  Filled 2019-04-20: qty 2

## 2019-04-20 MED ORDER — FUROSEMIDE 10 MG/ML IJ SOLN
80.0000 mg | Freq: Once | INTRAMUSCULAR | Status: AC
  Administered 2019-04-20: 01:00:00 80 mg via INTRAVENOUS
  Filled 2019-04-20: qty 8

## 2019-04-20 MED ORDER — ENOXAPARIN SODIUM 40 MG/0.4ML ~~LOC~~ SOLN
40.0000 mg | Freq: Every day | SUBCUTANEOUS | Status: DC
  Administered 2019-04-21 – 2019-04-26 (×3): 40 mg via SUBCUTANEOUS
  Filled 2019-04-20 (×9): qty 0.4

## 2019-04-20 MED ORDER — NITROGLYCERIN 0.4 MG SL SUBL: 0.4000 mg | SUBLINGUAL_TABLET | SUBLINGUAL | Status: DC | PRN

## 2019-04-20 MED ORDER — BENZONATATE 100 MG PO CAPS
100.0000 mg | ORAL_CAPSULE | Freq: Once | ORAL | Status: AC
  Administered 2019-04-20: 03:00:00 100 mg via ORAL
  Filled 2019-04-20: qty 1

## 2019-04-20 MED ORDER — ONDANSETRON HCL 4 MG/2ML IJ SOLN: 4.0000 mg | Freq: Four times a day (QID) | INTRAMUSCULAR | Status: DC | PRN

## 2019-04-20 MED ORDER — ACETAMINOPHEN 650 MG RE SUPP: 650.0000 mg | Freq: Four times a day (QID) | RECTAL | Status: DC | PRN

## 2019-04-20 MED ORDER — CARVEDILOL 3.125 MG PO TABS
3.1250 mg | ORAL_TABLET | Freq: Two times a day (BID) | ORAL | Status: DC
  Administered 2019-04-20 – 2019-04-28 (×16): 3.125 mg via ORAL
  Filled 2019-04-20 (×16): qty 1

## 2019-04-20 NOTE — TOC Initial Note (Signed)
Transition of Care [facility]) - Initial/Assessment Note    Patient Details  Name: Deborah Ho MRN: [id number] Date of Birth: 04-01-1951  [facility]) CM/SW Contact:    Lawerance Sabal, RN Phone Number: 04/20/2019, 2:44 PM  Clinical Narrative:                Spoke to patient at bedside. She states that she lives at home alone, she drives, is retired. She denies difficulties obtaining meds. She states that when she is home she sometimes has difficulty standing for long periods of time, her legs get tired. She denies difficulty ambulating.  Prior notes state she is well known to the [facility] clinic with a history of noncompliance, and being difficult to get a hold of. Has used [facility] in the past, but is not currently active. She does not verbalize need for [facility] nurse, and sates that she is currently looking at agencies to assist with personal care services herself. We discussed ALF as a potential goal for her in the future.    Expected Discharge Plan: Home/Self Care Barriers to Discharge: Continued Medical Work up   Patient Goals and CMS Choice Patient states their goals for this hospitalization and ongoing recovery are:: to return home      Expected Discharge Plan and Services Expected Discharge Plan: Home/Self Care                                              Prior Living Arrangements/Services   Lives with:: Self Patient language and need for interpreter reviewed:: Yes Do you feel safe going back to the place where you live?: Yes            Criminal Activity/Legal Involvement Pertinent to Current Situation/Hospitalization: No - Comment as needed  Activities of Daily Living Home Assistive Devices/Equipment: None ADL Screening (condition at time of admission) Patient's cognitive ability adequate to safely complete daily activities?: Yes Is the patient deaf or have difficulty hearing?: No Does the patient have difficulty seeing, even when wearing glasses/contacts?:  No Does the patient have difficulty concentrating, remembering, or making decisions?: No Patient able to express need for assistance with ADLs?: Yes Does the patient have difficulty dressing or bathing?: No Independently performs ADLs?: Yes (appropriate for developmental age) Does the patient have difficulty walking or climbing stairs?: Yes Weakness of Legs: Both  Permission Sought/Granted                  Emotional Assessment              Admission diagnosis:  Peripheral edema [R60.9] Acute systolic congestive heart failure (HCC) [I50.21] Acute CHF (congestive heart failure) (HCC) [I50.9] Patient Active Problem List   Diagnosis Date Noted  . Abdominal pain 12/16/2018  . Acute CHF (congestive heart failure) (HCC) 12/15/2018  . Acute exacerbation of CHF (congestive heart failure) (HCC) 11/28/2018  . Acute on chronic systolic CHF (congestive heart failure) (HCC) 11/27/2018  . Noncompliance with medication regimen 04/02/2017  . Intractable nausea and vomiting 02/18/2017  . Hyperlipidemia   . GERD (gastroesophageal reflux disease)   . CHF (congestive heart failure) (HCC)   . Cellulitis of left lower extremity   . Cough productive of clear sputum   . Cellulitis of left lower leg   . Hypokalemia 01/28/2017  . Peripheral edema   . Abdominal distension 06/26/2016  . Anasarca 06/26/2016  .  Chronic systolic heart failure (HCC) 02/20/2016  . CKD (chronic kidney disease), stage III (HCC)   . Adjustment disorder with mixed anxiety and depressed mood 02/28/2015  . Pleural effusion on right   . Essential hypertension   . Angioedema of lips 08/17/2014  . Ischemic cardiomyopathy 08/13/2014  . Coronary atherosclerosis of native coronary artery 05/10/2014  . History of ST elevation myocardial infarction (STEMI) 05/09/2014  . HLD (hyperlipidemia) 05/06/2014  . Tobacco abuse 05/06/2014  . Cardiomyopathy, ischemic 05/06/2014  . Elevated LFTs 05/06/2014   PCP:  [facility] Pharmacy:   [facility] [address], [place] - [address] [place] [address] Phone: [phone] Fax: [phone]     Social Determinants of Health (SDOH) Interventions    Readmission Risk Interventions No flowsheet data found.

## 2019-04-20 NOTE — Progress Notes (Signed)
PROGRESS NOTE   Deborah Ho  WRU:[id number]    DOB: 12/02/1950    DOA: 04/19/2019  PCP: [facility]   I have briefly reviewed patients previous medical records in [facility].  Brief Narrative:  68 year old female, lives alone, independent, PMH of CAD, anterior STEMI with early in-stent thrombosis due to missed Brilinta s/p DES x2 to LAD 04/2014, chronic combined CHF, ICM (TTE 11/28/2018: LVEF 15% with diffuse hypokinesis and grade 3 diastolic dysfunction), GERD, HTN, HLD, medical noncompliance, [facility] ED [facility] 04/19/2019 due to progressive generalized body swelling and 20 pound weight gain of approximately 2-week duration, progressive dyspnea of 1 week duration after stopping her home Bumex which she states has not been working for her.  Admitted for decompensated CHF/anasarca.  Cardiology consulted.   Assessment & Plan:   Principal Problem:   Acute on chronic systolic CHF (congestive heart failure) (HCC) Active Problems:   Essential hypertension   CKD (chronic kidney disease), stage III (HCC)   Acute CHF (congestive heart failure) (HCC)   Acute on chronic combined systolic and diastolic CHF/ICM  TTE 11/28/2018: LVEF 15% with diffuse hypokinesis and grade 3 diastolic dysfunction  Reports that she quit taking Bumex approximately a week ago because it was not working well for her.  Started on IV Lasix 80 mg every 12 hourly.  Strict intake output, daily weights and fluid restriction.  Not on ACEI/ARB due to chronic kidney disease.  Patient is severely volume overloaded and may take several days to stabilize.  Cardiology consulted for assistance.  Anasarca  Secondary to decompensated CHF and chronic kidney disease.  Although she has proteinuria, not sure if she has nephrotic range proteinuria.  Unable to complete 24-hour urine protein in January 2020.  Diuretic management as noted above.  CAD status post PCI  No chest pain reported.   Continue Brilinta, atorvastatin, Imdur and carvedilol.  Essential hypertension  Mildly uncontrolled.  Continue carvedilol 3.125 mg twice daily and Imdur 30 mg daily.  Also on IV Lasix 80 mg every 12 hours.  Added PRN IV hydralazine.  Monitor and adjust medications as needed.  Hyperlipidemia  Continue statins.  Stage III chronic kidney disease  Creatinine appears to be at baseline.  Follow BMP closely while on aggressive IV diuresis.  Morbid obesity/Body mass index is 40.94 kg/m.    DVT prophylaxis: Lovenox Code Status: Full Family Communication: None at bedside Disposition: DC home pending clinical improvement   Consultants:  Cardiology  Procedures:  None  Antimicrobials:  None   Subjective: Overall feels better.  Leg swelling somewhat improved.  Dyspnea improved.  No chest pain reported.  Rest of history as noted above.  ROS: As above, otherwise negative.  Objective:  Vitals:   04/20/19 0300 04/20/19 0330 04/20/19 0421 04/20/19 0838  BP: (!) 148/92 (!) 154/100 (!) 156/79 (!) 134/107  Pulse: 84 80 79 79  Resp: 20 20 (!) 28 18  Temp:   97.7 F (36.5 C)   TempSrc:   Oral   SpO2: 99% 96% 93% 99%  Weight:   111.6 kg   Height:   5\' 5"  (1.651 m)     Examination:  General exam: Pleasant middle-aged female, moderately built and morbidly obese sitting up comfortably in bed without distress. Respiratory system: Slightly diminished breath sounds in the bases with few fine basal crackles.  Rest of lung fields clear to auscultation. Respiratory effort normal. Cardiovascular system: S1 & S2 heard, RRR. No JVD, murmurs, rubs, gallops  or clicks.  3+ pitting leg edema extending to anterior abdominal wall and presacral edema.  Also has 1+ pitting edema of bilateral upper extremities.  Telemetry personally reviewed: Sinus rhythm, low voltage with BBB morphology. Gastrointestinal system: Abdomen is nondistended, soft and nontender. No organomegaly or masses felt.  Normal bowel sounds heard. Central nervous system: Alert and oriented. No focal neurological deficits. Extremities: Symmetric 5 x 5 power. Skin: No rashes, lesions or ulcers Psychiatry: Judgement and insight appear normal. Mood & affect appropriate.     Data Reviewed: I have personally reviewed following labs and imaging studies  CBC: Recent Labs  Lab 04/20/19 0045  WBC 6.6  NEUTROABS 4.3  HGB 15.5*  HCT 51.7*  MCV 90.1  PLT 267   Basic Metabolic Panel: Recent Labs  Lab 04/20/19 0045  NA 142  K 4.6  CL 105  CO2 24  GLUCOSE 95  BUN 19  CREATININE 1.59*  CALCIUM 9.8   Liver Function Tests: Recent Labs  Lab 04/20/19 0045  AST 24  ALT 12  ALKPHOS 176*  BILITOT 3.0*  PROT 7.4  ALBUMIN 3.6   Cardiac Enzymes: Recent Labs  Lab 04/20/19 1004 04/20/19 1209  TROPONINI <0.03 <0.03     Recent Results (from the past 240 hour(s))  SARS Coronavirus 2 (CEPHEID - Performed in [facility] hospital lab), Hosp Order     Status: None   Collection Time: 04/20/19  1:12 AM  Result Value Ref Range Status   SARS Coronavirus 2 NEGATIVE NEGATIVE Final    Comment: (NOTE) If result is NEGATIVE SARS-CoV-2 target nucleic acids are NOT DETECTED. The SARS-CoV-2 RNA is generally detectable in upper and lower  respiratory specimens during the acute phase of infection. The lowest  concentration of SARS-CoV-2 viral copies this assay can detect is 250  copies / mL. A negative result does not preclude SARS-CoV-2 infection  and should not be used as the sole basis for treatment or other  patient management decisions.  A negative result may occur with  improper specimen collection / handling, submission of specimen other  than nasopharyngeal swab, presence of viral mutation(s) within the  areas targeted by this assay, and inadequate number of viral copies  (<250 copies / mL). A negative result must be combined with clinical  observations, patient history, and epidemiological information.  If result is POSITIVE SARS-CoV-2 target nucleic acids are DETECTED. The SARS-CoV-2 RNA is generally detectable in upper and lower  respiratory specimens dur ing the acute phase of infection.  Positive  results are indicative of active infection with SARS-CoV-2.  Clinical  correlation with patient history and other diagnostic information is  necessary to determine patient infection status.  Positive results do  not rule out bacterial infection or co-infection with other viruses. If result is PRESUMPTIVE POSTIVE SARS-CoV-2 nucleic acids MAY BE PRESENT.   A presumptive positive result was obtained on the submitted specimen  and confirmed on repeat testing.  While 2019 novel coronavirus  (SARS-CoV-2) nucleic acids may be present in the submitted sample  additional confirmatory testing may be necessary for epidemiological  and / or clinical management purposes  to differentiate between  SARS-CoV-2 and other Sarbecovirus currently known to infect humans.  If clinically indicated additional testing with an alternate test  methodology [phone]) is advised. The SARS-CoV-2 RNA is generally  detectable in upper and lower respiratory sp ecimens during the acute  phase of infection. The expected result is Negative. Fact Sheet for Patients:  BoilerBrush.com.cy Fact Sheet for Healthcare  Providers: https://pope.com/ This test is not yet approved or cleared by the [place] and has been authorized for detection and/or diagnosis of SARS-CoV-2 by FDA under an Emergency Use Authorization (EUA).  This EUA will remain in effect (meaning this test can be used) for the duration of the COVID-19 declaration under Section 564(b)(1) of the Act, 21 U.S.C. section 360bbb-3(b)(1), unless the authorization is terminated or revoked sooner. Performed at [facility], [address], [place], [place] [address]          Radiology Studies: Dg Chest  2 View  Result Date: 04/19/2019 CLINICAL DATA:  68 year old female with shortness of breath. EXAM: CHEST - 2 VIEW COMPARISON:  Chest radiograph dated 01/13/2019 FINDINGS: Moderate cardiomegaly similar to prior radiograph. Mild vascular congestion. No edema. There is no focal consolidation or pneumothorax. Blunting of the right costophrenic angle similar to prior radiograph likely a small pleural effusion seen on the CT of 12/15/2018. The osseous structures are intact. IMPRESSION: Cardiomegaly with mild vascular congestion and small right pleural effusion. No significant interval change since the prior radiograph. Electronically Signed   By: Elgie Collard M.D.   On: 04/19/2019 19:07        Scheduled Meds: . atorvastatin  40 mg Oral q1800  . carvedilol  3.125 mg Oral BID WC  . enoxaparin (LOVENOX) injection  40 mg Subcutaneous Daily  . furosemide  80 mg Intravenous BID  . isosorbide mononitrate  30 mg Oral Daily  . magnesium oxide  400 mg Oral Q14 Days  . potassium chloride SA  20 mEq Oral Daily  . ticagrelor  60 mg Oral BID   Continuous Infusions:   LOS: 0 days     Marcellus Scott, MD, FACP, [facility]. [facility]  To contact the attending provider between 7A-7P or the covering provider during after hours 7P-7A, please log into the web site [email or web address] and access using universal [place] password for that web site. If you do not have the password, please call the hospital operator.  04/20/2019, 2:34 PM

## 2019-04-20 NOTE — H&P (Signed)
History and Physical    Deborah Ho ZOX:[id number] DOB: Aug 15, 1951 DOA: 04/19/2019  PCP: [facility]  Patient coming from: Home.  Chief Complaint: Shortness of breath and lower extremity edema.  HPI: Deborah Ho is a 67 y.o. female with history of ischemic cardiomyopathy last EF measured in January 2020 was 15% with grade 3 diastolic dysfunction presents to the ER with complaints of increasing lower extremity edema abdominal distention and gain of 20 pounds weight.  Patient states she has been compliant with her Bumex since 2 months now.  Denies any chest pain productive cough fever chills.  ED Course: In the ER on exam patient has significant edema extending from foot to the thigh abdominal distention and JVD elevation.  Shows cardiomegaly pleural effusion and CHF features.  BNP was 3200.  EKG was showing normal sinus rhythm with PVCs low-voltage.  Patient was given Lasix 80 mg IV and admitted for CHF exacerbation.  Review of Systems: As per HPI, rest all negative.   Past Medical History:  Diagnosis Date   CAD (coronary artery disease) 05/03/14; 05/09/14   a. anterior STEMI with early in-stent thorombosis for missed dose of Brillinta s/p PCI with DESx 2 into LAD (04/2014)   CHF (congestive heart failure) (HCC)    GERD (gastroesophageal reflux disease)    Probable   HTN (hypertension)    Hyperlipidemia    Ischemic cardiomyopathy    a. 04/2014 ECHO with EF 45-50% b.  Repeat 2D echo 08/14/14 with EF down at 15%. Life vest placed   Non compliance w medication regimen    Tobacco use     Past Surgical History:  Procedure Laterality Date   CHOLECYSTECTOMY     CORONARY ANGIOPLASTY WITH STENT PLACEMENT  05/03/14   STEMI- stent to LAD DES- Xience alpine   CORONARY ANGIOPLASTY WITH STENT PLACEMENT  05/09/14   STEMI- overlapping stent to LAD, pt had missed a dose of Brilinta   LEFT HEART CATHETERIZATION WITH CORONARY ANGIOGRAM N/A 05/03/2014   Procedure: LEFT HEART CATHETERIZATION WITH CORONARY ANGIOGRAM;  Surgeon: Peter M Swaziland, MD;  Location: [facility] CATH LAB;  Service: Cardiovascular;  Laterality: N/A;   LEFT HEART CATHETERIZATION WITH CORONARY ANGIOGRAM N/A 05/09/2014   Procedure: LEFT HEART CATHETERIZATION WITH CORONARY ANGIOGRAM;  Surgeon: Corky Crafts, MD;  Location: [facility] CATH LAB;  Service: Cardiovascular;  Laterality: N/A;   PARTIAL HYSTERECTOMY     PERCUTANEOUS STENT INTERVENTION  05/03/2014   Procedure: PERCUTANEOUS STENT INTERVENTION;  Surgeon: Peter M Swaziland, MD;  Location: [facility] CATH LAB;  Service: Cardiovascular;;  DES Prox LAD      reports that she has been smoking cigarettes. She has a 0.05 pack-year smoking history. She uses smokeless tobacco. She reports that she does not drink alcohol or use drugs.  Allergies  Allergen Reactions   Aspirin Swelling    Chewable children's aspirin makes patients tongue and face swell   Effient [Prasugrel] Swelling    Patient's tongue and face swells   Entresto [Sacubitril-Valsartan] Other (See Comments)    dizziness   Lactose Intolerance (Gi) Other (See Comments)    REACTION: stomach upset   Robitussin Dm [Guaifenesin-Dm] Swelling    Patient's tongue swells   Sulfa Antibiotics Swelling   Other     Had to replace "catgut" with clamps    Family History  Problem Relation Age of Onset   Diabetes Mother    Hypertension Mother     Prior to Admission medications   Medication Sig Start  Date End Date Taking? Authorizing Provider  atorvastatin (LIPITOR) 40 MG tablet Take 1 tablet (40 mg total) by mouth daily at 6 PM. 03/18/18   Sharol Harness, Roxy Horseman, PA-C  bumetanide (BUMEX) 2 MG tablet Take 1 tablet (2 mg total) by mouth daily. 01/18/19 01/18/20  Leatha Gilding, MD  carvedilol (COREG) 3.125 MG tablet Take 1 tablet (3.125 mg total) by mouth 2 (two) times daily with a meal. 01/18/19   Gherghe, Daylene Katayama, MD  cetirizine (ZYRTEC) 10 MG tablet Take 10 mg daily by mouth.      [provider]  cholecalciferol (VITAMIN D) 1000 units tablet Take 1,000 Units by mouth every 14 (fourteen) days. Twice a month    [provider]  Ferrous Sulfate (IRON PO) Take 1 tablet by mouth 3 (three) times a week. Take on MWF    [provider]  fluticasone (FLONASE) 50 MCG/ACT nasal spray Place 1 spray into both nostrils daily as needed for allergies (congestion).  04/04/18   [provider]  isosorbide mononitrate (IMDUR) 30 MG 24 hr tablet Take 1 tablet (30 mg total) by mouth daily. 12/04/18   Alwyn Ren, MD  magnesium oxide (MAG-OX) 400 (241.3 Mg) MG tablet Take 400 mg by mouth every 14 (fourteen) days. Twice a month    [provider]  nitroGLYCERIN (NITROSTAT) 0.4 MG SL tablet Place 0.4 mg under the tongue every 5 (five) minutes as needed for chest pain.    [provider]  potassium chloride SA (K-DUR,KLOR-CON) 20 MEQ tablet Take 1 tablet (20 mEq total) by mouth daily. 12/04/18   Alwyn Ren, MD  ticagrelor (BRILINTA) 60 MG TABS tablet Take 1 tablet (60 mg total) by mouth 2 (two) times daily. Patient not taking: Reported on 01/13/2019 04/02/17   Eddie North, MD    Physical Exam: Vitals:   04/20/19 0215 04/20/19 0230 04/20/19 0245 04/20/19 0300  BP: (!) 137/94 (!) 158/98 (!) 148/97 (!) 148/92  Pulse:    84  Resp: (!) 24 (!) 22 17 20   Temp:      TempSrc:      SpO2:   98% 99%      Constitutional: Moderately built and nourished. Vitals:   04/20/19 0215 04/20/19 0230 04/20/19 0245 04/20/19 0300  BP: (!) 137/94 (!) 158/98 (!) 148/97 (!) 148/92  Pulse:    84  Resp: (!) 24 (!) 22 17 20   Temp:      TempSrc:      SpO2:   98% 99%   Eyes: Anicteric no pallor. ENMT: No discharge from the ears eyes nose and mouth. Neck: JVD elevated no mass felt. Respiratory: No rhonchi or crepitations. Cardiovascular: S1-S2 heard. Abdomen: Distended nontender bowel sounds present. Musculoskeletal: Bilateral lower  extremity edema extending up to the thigh. Skin: No rash. Neurologic: Alert awake oriented to time place and person.  Moves all extremities. Psychiatric: Appears normal.   Labs on Admission: I have personally reviewed following labs and imaging studies  CBC: Recent Labs  Lab 04/20/19 0045  WBC 6.6  NEUTROABS 4.3  HGB 15.5*  HCT 51.7*  MCV 90.1  PLT 267   Basic Metabolic Panel: Recent Labs  Lab 04/20/19 0045  NA 142  K 4.6  CL 105  CO2 24  GLUCOSE 95  BUN 19  CREATININE 1.59*  CALCIUM 9.8   GFR: CrCl cannot be calculated (Unknown ideal weight.). Liver Function Tests: Recent Labs  Lab 04/20/19 0045  AST 24  ALT 12  ALKPHOS  176*  BILITOT 3.0*  PROT 7.4  ALBUMIN 3.6   No results for input(s): LIPASE, AMYLASE in the last 168 hours. No results for input(s): AMMONIA in the last 168 hours. Coagulation Profile: No results for input(s): INR, PROTIME in the last 168 hours. Cardiac Enzymes: No results for input(s): CKTOTAL, CKMB, CKMBINDEX, TROPONINI in the last 168 hours. BNP (last 3 results) No results for input(s): PROBNP in the last 8760 hours. HbA1C: No results for input(s): HGBA1C in the last 72 hours. CBG: No results for input(s): GLUCAP in the last 168 hours. Lipid Profile: No results for input(s): CHOL, HDL, LDLCALC, TRIG, CHOLHDL, LDLDIRECT in the last 72 hours. Thyroid Function Tests: No results for input(s): TSH, T4TOTAL, FREET4, T3FREE, THYROIDAB in the last 72 hours. Anemia Panel: No results for input(s): VITAMINB12, FOLATE, FERRITIN, TIBC, IRON, RETICCTPCT in the last 72 hours. Urine analysis:    Component Value Date/Time   COLORURINE YELLOW 12/16/2018 0024   APPEARANCEUR CLEAR 12/16/2018 0024   LABSPEC 1.034 (H) 12/16/2018 0024   PHURINE 5.0 12/16/2018 0024   GLUCOSEU NEGATIVE 12/16/2018 0024   HGBUR NEGATIVE 12/16/2018 0024   BILIRUBINUR NEGATIVE 12/16/2018 0024   KETONESUR NEGATIVE 12/16/2018 0024   PROTEINUR 100 (A) 12/16/2018 0024    UROBILINOGEN 1.0 07/25/2015 1601   NITRITE NEGATIVE 12/16/2018 0024   LEUKOCYTESUR NEGATIVE 12/16/2018 0024   Sepsis Labs: @LABRCNTIP (procalcitonin:4,lacticidven:4) ) Recent Results (from the past 240 hour(s))  SARS Coronavirus 2 (CEPHEID - Performed in [facility] hospital lab), Hosp Order     Status: None   Collection Time: 04/20/19  1:12 AM  Result Value Ref Range Status   SARS Coronavirus 2 NEGATIVE NEGATIVE Final    Comment: (NOTE) If result is NEGATIVE SARS-CoV-2 target nucleic acids are NOT DETECTED. The SARS-CoV-2 RNA is generally detectable in upper and lower  respiratory specimens during the acute phase of infection. The lowest  concentration of SARS-CoV-2 viral copies this assay can detect is 250  copies / mL. A negative result does not preclude SARS-CoV-2 infection  and should not be used as the sole basis for treatment or other  patient management decisions.  A negative result may occur with  improper specimen collection / handling, submission of specimen other  than nasopharyngeal swab, presence of viral mutation(s) within the  areas targeted by this assay, and inadequate number of viral copies  (<250 copies / mL). A negative result must be combined with clinical  observations, patient history, and epidemiological information. If result is POSITIVE SARS-CoV-2 target nucleic acids are DETECTED. The SARS-CoV-2 RNA is generally detectable in upper and lower  respiratory specimens dur ing the acute phase of infection.  Positive  results are indicative of active infection with SARS-CoV-2.  Clinical  correlation with patient history and other diagnostic information is  necessary to determine patient infection status.  Positive results do  not rule out bacterial infection or co-infection with other viruses. If result is PRESUMPTIVE POSTIVE SARS-CoV-2 nucleic acids MAY BE PRESENT.   A presumptive positive result was obtained on the submitted specimen  and confirmed on  repeat testing.  While 2019 novel coronavirus  (SARS-CoV-2) nucleic acids may be present in the submitted sample  additional confirmatory testing may be necessary for epidemiological  and / or clinical management purposes  to differentiate between  SARS-CoV-2 and other Sarbecovirus currently known to infect humans.  If clinically indicated additional testing with an alternate test  methodology [phone]) is advised. The SARS-CoV-2 RNA is generally  detectable in upper and  lower respiratory sp ecimens during the acute  phase of infection. The expected result is Negative. Fact Sheet for Patients:  BoilerBrush.com.cyhttps://www.fda.gov/media/136312/download Fact Sheet for Healthcare Providers: https://pope.com/https://www.fda.gov/media/136313/download This test is not yet approved or cleared by the [place] FDA and has been authorized for detection and/or diagnosis of SARS-CoV-2 by FDA under an Emergency Use Authorization (EUA).  This EUA will remain in effect (meaning this test can be used) for the duration of the COVID-19 declaration under Section 564(b)(1) of the Act, 21 U.S.C. section 360bbb-3(b)(1), unless the authorization is terminated or revoked sooner. Performed at [facility], [address] [place], [place] [address]      Radiological Exams on Admission: Dg Chest 2 View  Result Date: 04/19/2019 CLINICAL DATA:  68 year old female with shortness of breath. EXAM: CHEST - 2 VIEW COMPARISON:  Chest radiograph dated 01/13/2019 FINDINGS: Moderate cardiomegaly similar to prior radiograph. Mild vascular congestion. No edema. There is no focal consolidation or pneumothorax. Blunting of the right costophrenic angle similar to prior radiograph likely a small pleural effusion seen on the CT of 12/15/2018. The osseous structures are intact. IMPRESSION: Cardiomegaly with mild vascular congestion and small right pleural effusion. No significant interval change since the prior radiograph. Electronically Signed   By:  Elgie CollardArash  Radparvar M.D.   On: 04/19/2019 19:07    EKG: Independently reviewed.  Normal sinus rhythm.  PVCs.  Assessment/Plan Principal Problem:   Acute on chronic systolic CHF (congestive heart failure) (HCC) Active Problems:   Essential hypertension   CKD (chronic kidney disease), stage III (HCC)   Acute CHF (congestive heart failure) (HCC)    1. Acute on chronic combined systolic and diastolic CHF last EF measured in January 2020 was 18% with grade 3 diastolic dysfunction -patient received Lasix 80 mg IV in the ER and I have placed patient on 80 mg IV every 12.  Closely follow intake output metabolic panel daily weights.  Not on ACE inhibitor's are ARB due to chronic kidney disease. 2. CAD status post stenting denies any chest pain troponin pending.  On antiplatelet agents Imdur statins and Coreg. 3. Chronic kidney disease stage III -creatinine appears to be at baseline.  Follow metabolic panel. 4. Morbid obesity.  Will need nutrition input. 5. Hypertension on Coreg Imdur and Lasix.   DVT prophylaxis: Lovenox. Code Status: Full code. Family Communication: Discussed with patient. Disposition Plan: Home. Consults called: None. Admission status: Inpatient.   Eduard ClosArshad N Daney Moor MD [facility] Pager [phone].  If 7PM-7AM, please contact night-coverage [email or web address] Password [email or web address]  04/20/2019, 3:24 AM

## 2019-04-20 NOTE — ED Notes (Signed)
ED TO INPATIENT HANDOFF REPORT  ED Nurse Name and Phone #:  Molly Maduro RN [phone]  S Name/Age/Gender Deborah Ho 68 y.o. female Room/Bed: 032C/032C  Code Status   Code Status: Prior  Home/SNF/Other:  HOME {Patient oriented x4 Is this baseline: NO  Triage Complete: Triage complete  Chief Complaint retaining fluid, joint pain  Triage Note Pt reports that she has been having bilateral leg swelling x 1 week, and abdominal swelling x 3 days. C/o shortness of breath with exertion. States she was recently taken off her furosemide. Denies chest pain.   Allergies Allergies  Allergen Reactions  . Aspirin Swelling    Chewable children's aspirin makes patients tongue and face swell  . Effient [Prasugrel] Swelling    Patient's tongue and face swells  . Entresto [Sacubitril-Valsartan] Other (See Comments)    dizziness  . Lactose Intolerance (Gi) Other (See Comments)    REACTION: stomach upset  . Robitussin Dm [Guaifenesin-Dm] Swelling    Patient's tongue swells  . Sulfa Antibiotics Swelling  . Other     Had to replace "catgut" with clamps    Level of Care/Admitting Diagnosis ED Disposition    ED Disposition Condition Comment   Admit  Hospital Area: [facility] [[id number]]  Level of Care: Telemetry Cardiac [103]  Covid Evaluation: N/A  Diagnosis: Acute CHF (congestive heart failure) [facility]) [[id number]]  Admitting Physician: Eduard Clos [phone]  Attending Physician: Eduard Clos [phone]  Estimated length of stay: past midnight tomorrow  Certification:: I certify this patient will need inpatient services for at least 2 midnights  PT Class (Do Not Modify): Inpatient [101]  PT Acc Code (Do Not Modify): Private [1]       B Medical/Surgery History Past Medical History:  Diagnosis Date  . CAD (coronary artery disease) 05/03/14; 05/09/14   a. anterior STEMI with early in-stent thorombosis for missed dose of Brillinta s/p PCI with DESx 2 into LAD  (04/2014)  . CHF (congestive heart failure) (HCC)   . GERD (gastroesophageal reflux disease)    Probable  . HTN (hypertension)   . Hyperlipidemia   . Ischemic cardiomyopathy    a. 04/2014 ECHO with EF 45-50% b.  Repeat 2D echo 08/14/14 with EF down at 15%. Life vest placed  . Non compliance w medication regimen   . Tobacco use    Past Surgical History:  Procedure Laterality Date  . CHOLECYSTECTOMY    . CORONARY ANGIOPLASTY WITH STENT PLACEMENT  05/03/14   STEMI- stent to LAD DES- Xience alpine  . CORONARY ANGIOPLASTY WITH STENT PLACEMENT  05/09/14   STEMI- overlapping stent to LAD, pt had missed a dose of Brilinta  . LEFT HEART CATHETERIZATION WITH CORONARY ANGIOGRAM N/A 05/03/2014   Procedure: LEFT HEART CATHETERIZATION WITH CORONARY ANGIOGRAM;  Surgeon: Peter M Swaziland, MD;  Location: [facility] CATH LAB;  Service: Cardiovascular;  Laterality: N/A;  . LEFT HEART CATHETERIZATION WITH CORONARY ANGIOGRAM N/A 05/09/2014   Procedure: LEFT HEART CATHETERIZATION WITH CORONARY ANGIOGRAM;  Surgeon: Corky Crafts, MD;  Location: [facility] CATH LAB;  Service: Cardiovascular;  Laterality: N/A;  . PARTIAL HYSTERECTOMY    . PERCUTANEOUS STENT INTERVENTION  05/03/2014   Procedure: PERCUTANEOUS STENT INTERVENTION;  Surgeon: Peter M Swaziland, MD;  Location: [facility] CATH LAB;  Service: Cardiovascular;;  DES Prox LAD      A IV Location/Drains/Wounds Patient Lines/Drains/Airways Status   Active Line/Drains/Airways    Name:   Placement date:   Placement time:   Site:   Days:  Peripheral IV 04/20/19 Left Antecubital   04/20/19    0044    Antecubital   less than 1   Incision (Closed) 01/28/17 Leg Left;Posterior   01/28/17    2300     812   Wound / Incision (Open or Dehisced) 02/18/17 Venous stasis ulcer Leg Left Open Ulcer on left posterior leg   02/18/17    2130    Leg   791          Intake/Output Last 24 hours No intake or output data in the 24 hours ending 04/20/19 0322  Labs/Imaging Results for orders placed or  performed during the hospital encounter of 04/19/19 (from the past 48 hour(s))  CBC with Differential/Platelet     Status: Abnormal   Collection Time: 04/20/19 12:45 AM  Result Value Ref Range   WBC 6.6 4.0 - 10.5 K/uL   RBC 5.74 (H) 3.87 - 5.11 MIL/uL   Hemoglobin 15.5 (H) 12.0 - 15.0 g/dL   HCT 16.3 (H) 84.5 - 36.4 %   MCV 90.1 80.0 - 100.0 fL   MCH 27.0 26.0 - 34.0 pg   MCHC 30.0 30.0 - 36.0 g/dL   RDW 68.0 (H) 32.1 - 22.4 %   Platelets 267 150 - 400 K/uL   nRBC 0.0 0.0 - 0.2 %   Neutrophils Relative % 65 %   Neutro Abs 4.3 1.7 - 7.7 K/uL   Lymphocytes Relative 20 %   Lymphs Abs 1.3 0.7 - 4.0 K/uL   Monocytes Relative 12 %   Monocytes Absolute 0.8 0.1 - 1.0 K/uL   Eosinophils Relative 2 %   Eosinophils Absolute 0.1 0.0 - 0.5 K/uL   Basophils Relative 1 %   Basophils Absolute 0.1 0.0 - 0.1 K/uL   Immature Granulocytes 0 %   Abs Immature Granulocytes 0.02 0.00 - 0.07 K/uL    Comment: Performed at [facility], [address], [place], [place] [address]  Brain natriuretic peptide     Status: Abnormal   Collection Time: 04/20/19 12:45 AM  Result Value Ref Range   B Natriuretic Peptide 3,289.0 (H) 0.0 - 100.0 pg/mL    Comment: Performed at [facility], [address], [place], [place] [address]  Comprehensive metabolic panel     Status: Abnormal   Collection Time: 04/20/19 12:45 AM  Result Value Ref Range   Sodium 142 135 - 145 mmol/L   Potassium 4.6 3.5 - 5.1 mmol/L   Chloride 105 98 - 111 mmol/L   CO2 24 22 - 32 mmol/L   Glucose, Bld 95 70 - 99 mg/dL   BUN 19 8 - 23 mg/dL   Creatinine, Ser 8.89 (H) 0.44 - 1.00 mg/dL   Calcium 9.8 8.9 - 16.9 mg/dL   Total Protein 7.4 6.5 - 8.1 g/dL   Albumin 3.6 3.5 - 5.0 g/dL   AST 24 15 - 41 U/L   ALT 12 0 - 44 U/L   Alkaline Phosphatase 176 (H) 38 - 126 U/L   Total Bilirubin 3.0 (H) 0.3 - 1.2 mg/dL   GFR calc non Af Amer 33 (L) >60 mL/min   GFR calc Af Amer 39 (L) >60 mL/min   Anion gap 13 5 - 15    Comment:  Performed at [facility], [address] [place], [place] [address]  SARS Coronavirus 2 (CEPHEID - Performed in [facility] hospital lab), Hosp Order     Status: None   Collection Time: 04/20/19  1:12  AM  Result Value Ref Range   SARS Coronavirus 2 NEGATIVE NEGATIVE    Comment: (NOTE) If result is NEGATIVE SARS-CoV-2 target nucleic acids are NOT DETECTED. The SARS-CoV-2 RNA is generally detectable in upper and lower  respiratory specimens during the acute phase of infection. The lowest  concentration of SARS-CoV-2 viral copies this assay can detect is 250  copies / mL. A negative result does not preclude SARS-CoV-2 infection  and should not be used as the sole basis for treatment or other  patient management decisions.  A negative result may occur with  improper specimen collection / handling, submission of specimen other  than nasopharyngeal swab, presence of viral mutation(s) within the  areas targeted by this assay, and inadequate number of viral copies  (<250 copies / mL). A negative result must be combined with clinical  observations, patient history, and epidemiological information. If result is POSITIVE SARS-CoV-2 target nucleic acids are DETECTED. The SARS-CoV-2 RNA is generally detectable in upper and lower  respiratory specimens dur ing the acute phase of infection.  Positive  results are indicative of active infection with SARS-CoV-2.  Clinical  correlation with patient history and other diagnostic information is  necessary to determine patient infection status.  Positive results do  not rule out bacterial infection or co-infection with other viruses. If result is PRESUMPTIVE POSTIVE SARS-CoV-2 nucleic acids MAY BE PRESENT.   A presumptive positive result was obtained on the submitted specimen  and confirmed on repeat testing.  While 2019 novel coronavirus  (SARS-CoV-2) nucleic acids may be present in the submitted sample  additional confirmatory testing may be  necessary for epidemiological  and / or clinical management purposes  to differentiate between  SARS-CoV-2 and other Sarbecovirus currently known to infect humans.  If clinically indicated additional testing with an alternate test  methodology [phone] is advised. The SARS-CoV-2 RNA is generally  detectable in upper and lower respiratory sp ecimens during the acute  phase of infection. The expected result is Negative. Fact Sheet for Patients:  BoilerBrush.com.cyhttps://www.fda.gov/media/136312/download Fact Sheet for Healthcare Providers: https://pope.com/https://www.fda.gov/media/136313/download This test is not yet approved or cleared by the [place] FDA and has been authorized for detection and/or diagnosis of SARS-CoV-2 by FDA under an Emergency Use Authorization (EUA).  This EUA will remain in effect (meaning this test can be used) for the duration of the COVID-19 declaration under Section 564(b)(1) of the Act, 21 U.S.C. section 360bbb-3(b)(1), unless the authorization is terminated or revoked sooner. Performed at [facility], [address] [place], [place] [address]    Dg Chest 2 View  Result Date: 04/19/2019 CLINICAL DATA:  68 year old female with shortness of breath. EXAM: CHEST - 2 VIEW COMPARISON:  Chest radiograph dated 01/13/2019 FINDINGS: Moderate cardiomegaly similar to prior radiograph. Mild vascular congestion. No edema. There is no focal consolidation or pneumothorax. Blunting of the right costophrenic angle similar to prior radiograph likely a small pleural effusion seen on the CT of 12/15/2018. The osseous structures are intact. IMPRESSION: Cardiomegaly with mild vascular congestion and small right pleural effusion. No significant interval change since the prior radiograph. Electronically Signed   By: Elgie CollardArash  Radparvar M.D.   On: 04/19/2019 19:07    Pending Labs Unresulted Labs (From admission, onward)   None      Vitals/Pain Today's Vitals   04/20/19 0230 04/20/19 0245 04/20/19 0246  04/20/19 0300  BP: (!) 158/98 (!) 148/97  (!) 148/92  Pulse:    84  Resp: (!) 22 17  20  Temp:      TempSrc:      SpO2:  98%  99%  PainSc:  0-No pain 0-No pain 0-No pain    Isolation Precautions No active isolations  Medications Medications  furosemide (LASIX) injection 80 mg (80 mg Intravenous Given 04/20/19 0117)  fentaNYL (SUBLIMAZE) injection 100 mcg (100 mcg Intravenous Given 04/20/19 0321)  benzonatate (TESSALON) capsule 100 mg (100 mg Oral Given 04/20/19 0321)    Mobility walks Low fall risk   Focused Assessments Ambulatory to toilet   R Recommendations: See Admitting Provider Note  Report given to:   Additional Notes:

## 2019-04-20 NOTE — Consult Note (Addendum)
Cardiology Consultation:   Patient ID: Deborah Ho MRN: [id number]; DOB: May 24, 1951  Admit date: 04/19/2019 Date of Consult: 04/20/2019  Primary Care Provider: [facility] Primary Cardiologist: Peter Swaziland, MD  Primary Electrophysiologist:  None    Patient Profile:   Deborah Ho is a 68 y.o. female with a hx of Chronic combined systolic and diastolic heart failure with last EF 18% in 12/2018, CAD with ant STEMI due to early stent thrombosis for missing doses of Brilinta-had PCI X2 to LAD 2015, ischemic cardiomyopathy, CKD stage 3, Morbid obesity, Hypertension, hyperlipidemia who is being seen today for the evaluation of CHF at the request of Dr. Waymon Amato.  History of Present Illness:   [name] is a little difficult to get a good history of present illness from. She apparently has had about 1 week of increasing lower extremity edema, first starting in her feet and moving up her legs and to her abdomen. She has shortness of breath with exertion but she denies orthopnea. She appears to have very tight edema up to her abdomen and it is hard to imagine that she does not have trouble breathing when she lays down. She denies chest pain. She says that her legs started oozing and that is when she became concerned and sought treatment. When asked about her home weight, she says that she weighs but she stopped weighing herself when she started to swell up. She is unable to tell me what her usual wt is or how much she has gained. She is vague about taking her Bumex. First she said that she takes it as ordered and then she says that one day it made her urinate 19 times so she cut it in half and had not taken any since Monday. She also increased her liquid intake as she thought she was dehydrated. She says that she limits her salt intake. She seems to have little understanding of heart failure and fluid management.   This is her 4th hospitalization this year for heart  failure. She has apparently been a no show for her Heart Failure Clinic appointments and was unable to be reached for home health. CHF clinic is not wanting to follow her anymore due to non-compliance. She says that she had tried to call the clinic several times but no one would help her. It is unclear where she was calling. There may have been an issue due to the COVID 19 visit restrictions.   BNP 3289 CXR-Cardiomegaly with mild vascular congestion and small right pleural effusion. No significant interval change since the prior radiograph.  She has received lasix 80 mg IV in the ER. She says that her swelling is already improving, but still significant.   Pt denies current or past tobacco use except for when she was a teenager.   Past Medical History:  Diagnosis Date   CAD (coronary artery disease) 05/03/14; 05/09/14   a. anterior STEMI with early in-stent thorombosis for missed dose of Brillinta s/p PCI with DESx 2 into LAD (04/2014)   CHF (congestive heart failure) (HCC)    GERD (gastroesophageal reflux disease)    Probable   HTN (hypertension)    Hyperlipidemia    Ischemic cardiomyopathy    a. 04/2014 ECHO with EF 45-50% b.  Repeat 2D echo 08/14/14 with EF down at 15%. Life vest placed   Non compliance w medication regimen    Tobacco use     Past Surgical History:  Procedure Laterality Date   CHOLECYSTECTOMY  CORONARY ANGIOPLASTY WITH STENT PLACEMENT  05/03/14   STEMI- stent to LAD DES- Xience alpine   CORONARY ANGIOPLASTY WITH STENT PLACEMENT  05/09/14   STEMI- overlapping stent to LAD, pt had missed a dose of Brilinta   LEFT HEART CATHETERIZATION WITH CORONARY ANGIOGRAM N/A 05/03/2014   Procedure: LEFT HEART CATHETERIZATION WITH CORONARY ANGIOGRAM;  Surgeon: Peter M SwazilandJordan, MD;  [facility];  Service: Cardiovascular;  Laterality: N/A;   LEFT HEART CATHETERIZATION WITH CORONARY ANGIOGRAM N/A 05/09/2014   Procedure: LEFT HEART CATHETERIZATION WITH CORONARY  ANGIOGRAM;  Surgeon: Corky CraftsJayadeep S Varanasi, MD;  [facility];  Service: Cardiovascular;  Laterality: N/A;   PARTIAL HYSTERECTOMY     PERCUTANEOUS STENT INTERVENTION  05/03/2014   Procedure: PERCUTANEOUS STENT INTERVENTION;  Surgeon: Peter M SwazilandJordan, MD;  [facility];  Service: Cardiovascular;;  DES Prox LAD      Home Medications:  Prior to Admission medications   Medication Sig Start Date End Date Taking? Authorizing Provider  atorvastatin (LIPITOR) 40 MG tablet Take 1 tablet (40 mg total) by mouth daily at 6 PM. 03/18/18  Yes Robbie LisSimmons, Brittainy M, PA-C  carvedilol (COREG) 3.125 MG tablet Take 1 tablet (3.125 mg total) by mouth 2 (two) times daily with a meal. 01/18/19  Yes Gherghe, Daylene Katayamaostin M, MD  cetirizine (ZYRTEC) 10 MG tablet Take 10 mg daily by mouth.    Yes [provider]  cholecalciferol (VITAMIN D) 1000 units tablet Take 1,000 Units by mouth every 14 (fourteen) days. Twice a month   Yes [provider]  Ferrous Sulfate (IRON PO) Take 1 tablet by mouth 3 (three) times a week. Take on MWF   Yes [provider]  fluticasone (FLONASE) 50 MCG/ACT nasal spray Place 1 spray into both nostrils daily as needed for allergies (congestion).  04/04/18  Yes [provider]  isosorbide mononitrate (IMDUR) 30 MG 24 hr tablet Take 1 tablet (30 mg total) by mouth daily. 12/04/18  Yes Alwyn RenMathews, Elizabeth G, MD  magnesium oxide (MAG-OX) 400 (241.3 Mg) MG tablet Take 400 mg by mouth every 14 (fourteen) days. Twice a month   Yes [provider]  nitroGLYCERIN (NITROSTAT) 0.4 MG SL tablet Place 0.4 mg under the tongue every 5 (five) minutes as needed for chest pain.   Yes [provider]  potassium chloride SA (K-DUR,KLOR-CON) 20 MEQ tablet Take 1 tablet (20 mEq total) by mouth daily. 12/04/18  Yes Alwyn RenMathews, Elizabeth G, MD  ticagrelor (BRILINTA) 60 MG TABS tablet Take 1 tablet (60 mg total) by mouth 2 (two) times daily. 04/02/17  Yes Dhungel,  Nishant, MD  bumetanide (BUMEX) 2 MG tablet Take 1 tablet (2 mg total) by mouth daily. 01/18/19 01/18/20  Leatha GildingGherghe, Costin M, MD    Inpatient Medications: Scheduled Meds:  atorvastatin  40 mg Oral q1800   carvedilol  3.125 mg Oral BID WC   enoxaparin (LOVENOX) injection  40 mg Subcutaneous Daily   furosemide  80 mg Intravenous BID   isosorbide mononitrate  30 mg Oral Daily   magnesium oxide  400 mg Oral Q14 Days   potassium chloride SA  20 mEq Oral Daily   ticagrelor  60 mg Oral BID   Continuous Infusions:  PRN Meds: acetaminophen **OR** acetaminophen, nitroGLYCERIN, ondansetron **OR** ondansetron (ZOFRAN) IV  Allergies:    Allergies  Allergen Reactions   Aspirin Swelling    Chewable children's aspirin makes patients tongue and face swell   Effient [Prasugrel] Swelling    Patient's tongue  and face swells   Entresto [Sacubitril-Valsartan] Other (See Comments)    dizziness   Lactose Intolerance (Gi) Other (See Comments)    REACTION: stomach upset   Robitussin Dm [Guaifenesin-Dm] Swelling    Patient's tongue swells   Sulfa Antibiotics Swelling   Other     Had to replace "catgut" with clamps    Social History:   Social History   Socioeconomic History   Marital status: Widowed    Spouse name: Not on file   Number of children: Not on file   Years of education: Not on file   Highest education level: Not on file  Occupational History   Occupation: not employed  Ecologistocial Needs   Financial resource strain: Not on file   Food insecurity:    Worry: Not on file    Inability: Not on file   Transportation needs:    Medical: Not on file    Non-medical: Not on file  Tobacco Use   Smoking status: Current Some Day Smoker    Packs/day: 0.10    Years: 0.50    Pack years: 0.05    Types: Cigarettes   Smokeless tobacco: Current User   Tobacco comment: down to 3 cigarettes daily (08/03/14)  Substance and Sexual Activity   Alcohol use: No   Drug use: No     Sexual activity: Not on file  Lifestyle   Physical activity:    Days per week: Not on file    Minutes per session: Not on file   Stress: Not on file  Relationships   Social connections:    Talks on phone: Not on file    Gets together: Not on file    Attends religious service: Not on file    Active member of club or organization: Not on file    Attends meetings of clubs or organizations: Not on file    Relationship status: Not on file   Intimate partner violence:    Fear of current or ex partner: Not on file    Emotionally abused: Not on file    Physically abused: Not on file    Forced sexual activity: Not on file  Other Topics Concern   Not on file  Social History Narrative   Patient has 6 brothers and sisters and none have known coronary artery disease. [place], but has several brothers in the area.    Family History:    Family History  Problem Relation Age of Onset   Diabetes Mother    Hypertension Mother      ROS:  Please see the history of present illness. All other ROS reviewed and negative.     Physical Exam/Data:   Vitals:   04/20/19 0300 04/20/19 0330 04/20/19 0421 04/20/19 0838  BP: (!) 148/92 (!) 154/100 (!) 156/79 (!) 134/107  Pulse: 84 80 79 79  Resp: 20 20 (!) 28 18  Temp:   97.7 F (36.5 C)   TempSrc:   Oral   SpO2: 99% 96% 93% 99%  Weight:   111.6 kg   Height:   5\' 5"  (1.651 m)     Intake/Output Summary (Last 24 hours) at 04/20/2019 1345 Last data filed at 04/20/2019 1200 Gross per 24 hour  Intake 642 ml  Output 700 ml  Net -58 ml   Last 3 Weights 04/20/2019 01/18/2019 01/17/2019  Weight (lbs) 246 lb 0.5 oz 213 lb 13.5 oz 217 lb  Weight (kg) 111.6 kg 97 kg 98.431 kg  Body mass index is 40.94 kg/m.  General:  Obese female, in no acute distress HEENT: normal Lymph: no adenopathy Neck: no JVD Endocrine:  No thryomegaly Vascular: No carotid bruits; Pedal pulses 2+ bilaterally  Cardiac:  normal S1, S2; RRR;  Distant heart sounds Lungs:  clear to auscultation bilaterally, no wheezing, rhonchi or rales  Abd: Abdomen firm Ext: Tight edema of entire legs up to abdomen Musculoskeletal:  No deformities, BUE and BLE strength normal and equal Skin: warm and dry  Neuro:  CNs 2-12 intact, no focal abnormalities noted Psych:  Normal affect   EKG:  The EKG was personally reviewed and demonstrates:  NSR, 80 bpm, PVCs. Low voltage, poor r wave progression Anterior infarct, old  Telemetry:  Telemetry was personally reviewed and demonstrates:  Sinus rhythm 60-70's with occ PVC  Relevant CV Studies:   Echocardiogram 11/28/2018 Study Conclusions - Left ventricle: The cavity size was normal. Wall thickness was   increased in a pattern of mild LVH. The estimated ejection   fraction was 15%. Diffuse hypokinesis. No mural thrombus by   Definity contrast. Doppler parameters are consistent with   restrictive left ventricular relaxation (grade 3 diastolic   dysfunction). The E/e&' ratio is >20, suggesting markedly elevated   LV filling pressure. - Mitral valve: Mildly thickened leaflets . There was moderate   regurgitation. - Left atrium: The atrium was normal in size. - Right ventricle: The cavity size was moderately dilated. Mild   systolic dysfunction. - Right atrium: Severely dilated. - Tricuspid valve: There was moderate regurgitation. - Pulmonary arteries: PA peak pressure: 53 mm Hg (S). - Inferior vena cava: The vessel was dilated. The respirophasic   diameter changes were blunted (< 50%), consistent with elevated   central venous pressure.  Impressions: - Compared to a prior study in 11/2017, the LVEF is unchanged at   15%. Left and right heart pressures are quite elevated.   Laboratory Data:  Chemistry Recent Labs  Lab 04/20/19 0045  NA 142  K 4.6  CL 105  CO2 24  GLUCOSE 95  BUN 19  CREATININE 1.59*  CALCIUM 9.8  GFRNONAA 33*  GFRAA 39*  ANIONGAP 13    Recent Labs  Lab  04/20/19 0045  PROT 7.4  ALBUMIN 3.6  AST 24  ALT 12  ALKPHOS 176*  BILITOT 3.0*   Hematology Recent Labs  Lab 04/20/19 0045  WBC 6.6  RBC 5.74*  HGB 15.5*  HCT 51.7*  MCV 90.1  MCH 27.0  MCHC 30.0  RDW 18.6*  PLT 267   Cardiac Enzymes Recent Labs  Lab 04/20/19 1004 04/20/19 1209  TROPONINI <0.03 <0.03   No results for input(s): TROPIPOC in the last 168 hours.  BNP Recent Labs  Lab 04/20/19 0045  BNP 3,289.0*    DDimer No results for input(s): DDIMER in the last 168 hours.  Radiology/Studies:  Dg Chest 2 View  Result Date: 04/19/2019 CLINICAL DATA:  68 year old female with shortness of breath. EXAM: CHEST - 2 VIEW COMPARISON:  Chest radiograph dated 01/13/2019 FINDINGS: Moderate cardiomegaly similar to prior radiograph. Mild vascular congestion. No edema. There is no focal consolidation or pneumothorax. Blunting of the right costophrenic angle similar to prior radiograph likely a small pleural effusion seen on the CT of 12/15/2018. The osseous structures are intact. IMPRESSION: Cardiomegaly with mild vascular congestion and small right pleural effusion. No significant interval change since the prior radiograph. Electronically Signed   By: Elgie Collard M.D.   On: 04/19/2019 19:07  Assessment and Plan:   1. Acute on chronic combined diastolic and systolic heart failure -Echo from 11/28/2018 showed LVEF of 15% with diffuse hypokinesis and grade 3 diastolic dysfunction. -Treated with carvedilol, Imdur, Bumex 2 mg daily. Not on ACE-I/ARB due to CKD. Unclear compliance with her diuretic.  -Pt presented for profound edema and oozing of her legs. She has mild shortness of breath but denies orthopnea.  -BNP 3289. Troponins negative.  -CXR consistent with mild CHF -Admission wt 246 lbs. Discharge wt from hospitalization in March  was 214 lbs. Up 32 pounds.  -After 2 dose of lasix 80 IV, 1000 ml UOP documented. Pt thinks she has had more. She feels like edema is already  improving.   -Pt needs significant diuresis. Continue lasix 80 mg IV BID.  -She will likely need several days to remove all of the fluid she has on board.  -Strict I&O and daily weights.  -fluid restriction to <2 L daily and sodium restriction < 2,000 mg daily.  -Pt has been non-compliant with follow up and no longer seen in the advance heart failure clinic due to repeated no show. She needs to be more proactive with HF monitoring and fluid management. I spoke to her about this and am not sure how much she is willing to undertake. She says that if we make her a hospital follow up appointment, she will go. Will make it at Dr. Elvis Coil office.   2. CAD with ischemic cardiomyopathy -Continue Brilinta, beta-blocker, and high-intensity statin.  -No current chest pain.   3. Hypertension -On low dose carvedilol, Imdur, Bumex.  -BP elevated. Continue to monitor with diuresis.  -Can consider adding hydralazine if she will take it. Multiple daily doses may be a problem for her. Also could increase carvedilol although she has been resistant to this in the past.   4. Hyperlipidemia -On high intensity statin  With atorvastatin 40 mg daily. -LDL by last lipid panel in 03/2018 was 81. Will leave on current therapy.  -Update lipid panel in am.   5. CKD stage 3 -Baseline SCr ~1.3-1.5 (occ up to 1.6-1.7). SCr 1.59 today. K+ normal at 4.6.  -Monitor renal function with diuresis.   6. Moderate pulmonary Hypertension - Moderate on Echo in 11/2018 with PASP of 53 mmHg. This is significantly higher than Echo from 11/2017 when PASP was 31 mmHg. Right ventricle  was also noted to be moderately dilated with mild dysfunction. -Needs diuresis.     For questions or updates, please contact [facility] Please consult [email or web address] for contact info under     Signed, Berton Bon, NP  04/20/2019 1:45 PM

## 2019-04-20 NOTE — ED Provider Notes (Signed)
[facility] Provider Note   CSN: [id number] Arrival date & time: 04/19/19  1738    History   Chief Complaint Chief Complaint  Patient presents with  . Shortness of Breath  . Leg Swelling    HPI Deborah Ho is a 68 y.o. female.     The history is provided by the patient.  Shortness of Breath  Severity:  Moderate Onset quality:  Gradual Duration:  1 week Timing:  Intermittent Progression:  Worsening Chronicity:  New Relieved by:  Rest Worsened by:  Activity Associated symptoms: no chest pain and no fever   PT With history of CAD, CHF presents with shortness of breath and peripheral edema.  She reports in the past week she is had increasing shortness of breath and bilateral leg swelling.  She reports significant abdominal swelling as well.  No fevers or vomiting She reports she has been placed on Bumex but it does not seem to get rid of her fluid  Past Medical History:  Diagnosis Date  . CAD (coronary artery disease) 05/03/14; 05/09/14   a. anterior STEMI with early in-stent thorombosis for missed dose of Brillinta s/p PCI with DESx 2 into LAD (04/2014)  . CHF (congestive heart failure) (HCC)   . GERD (gastroesophageal reflux disease)    Probable  . HTN (hypertension)   . Hyperlipidemia   . Ischemic cardiomyopathy    a. 04/2014 ECHO with EF 45-50% b.  Repeat 2D echo 08/14/14 with EF down at 15%. Life vest placed  . Non compliance w medication regimen   . Tobacco use     Patient Active Problem List   Diagnosis Date Noted  . Abdominal pain 12/16/2018  . Acute CHF (congestive heart failure) (HCC) 12/15/2018  . Acute exacerbation of CHF (congestive heart failure) (HCC) 11/28/2018  . Acute on chronic systolic CHF (congestive heart failure) (HCC) 11/27/2018  . Noncompliance with medication regimen 04/02/2017  . Intractable nausea and vomiting 02/18/2017  . Hyperlipidemia   . GERD (gastroesophageal reflux disease)   . CHF  (congestive heart failure) (HCC)   . Cellulitis of left lower extremity   . Cough productive of clear sputum   . Cellulitis of left lower leg   . Hypokalemia 01/28/2017  . Peripheral edema   . Abdominal distension 06/26/2016  . Anasarca 06/26/2016  . Chronic systolic heart failure (HCC) 02/20/2016  . CKD (chronic kidney disease), stage III (HCC)   . Adjustment disorder with mixed anxiety and depressed mood 02/28/2015  . Pleural effusion on right   . Essential hypertension   . Angioedema of lips 08/17/2014  . Ischemic cardiomyopathy 08/13/2014  . Coronary atherosclerosis of native coronary artery 05/10/2014  . History of ST elevation myocardial infarction (STEMI) 05/09/2014  . HLD (hyperlipidemia) 05/06/2014  . Tobacco abuse 05/06/2014  . Cardiomyopathy, ischemic 05/06/2014  . Elevated LFTs 05/06/2014    Past Surgical History:  Procedure Laterality Date  . CHOLECYSTECTOMY    . CORONARY ANGIOPLASTY WITH STENT PLACEMENT  05/03/14   STEMI- stent to LAD DES- Xience alpine  . CORONARY ANGIOPLASTY WITH STENT PLACEMENT  05/09/14   STEMI- overlapping stent to LAD, pt had missed a dose of Brilinta  . LEFT HEART CATHETERIZATION WITH CORONARY ANGIOGRAM N/A 05/03/2014   Procedure: LEFT HEART CATHETERIZATION WITH CORONARY ANGIOGRAM;  Surgeon: Peter M SwazilandJordan, MD;  [facility];  Service: Cardiovascular;  Laterality: N/A;  . LEFT HEART CATHETERIZATION WITH CORONARY ANGIOGRAM N/A 05/09/2014   Procedure: LEFT HEART CATHETERIZATION WITH  CORONARY ANGIOGRAM;  Surgeon: Corky CraftsJayadeep S Varanasi, MD;  [facility];  Service: Cardiovascular;  Laterality: N/A;  . PARTIAL HYSTERECTOMY    . PERCUTANEOUS STENT INTERVENTION  05/03/2014   Procedure: PERCUTANEOUS STENT INTERVENTION;  Surgeon: Peter M SwazilandJordan, MD;  [facility];  Service: Cardiovascular;;  DES Prox LAD      OB History   No obstetric history on file.      Home Medications    Prior to Admission medications   Medication  Sig Start Date End Date Taking? Authorizing Provider  atorvastatin (LIPITOR) 40 MG tablet Take 1 tablet (40 mg total) by mouth daily at 6 PM. 03/18/18   Sharol HarnessSimmons, Roxy HorsemanBrittainy M, PA-C  bumetanide (BUMEX) 2 MG tablet Take 1 tablet (2 mg total) by mouth daily. 01/18/19 01/18/20  Leatha GildingGherghe, Costin M, MD  carvedilol (COREG) 3.125 MG tablet Take 1 tablet (3.125 mg total) by mouth 2 (two) times daily with a meal. 01/18/19   Gherghe, Daylene Katayamaostin M, MD  cetirizine (ZYRTEC) 10 MG tablet Take 10 mg daily by mouth.     [provider]  cholecalciferol (VITAMIN D) 1000 units tablet Take 1,000 Units by mouth every 14 (fourteen) days. Twice a month    [provider]  Ferrous Sulfate (IRON PO) Take 1 tablet by mouth 3 (three) times a week. Take on MWF    [provider]  fluticasone (FLONASE) 50 MCG/ACT nasal spray Place 1 spray into both nostrils daily as needed for allergies (congestion).  04/04/18   [provider]  isosorbide mononitrate (IMDUR) 30 MG 24 hr tablet Take 1 tablet (30 mg total) by mouth daily. 12/04/18   Alwyn RenMathews, Elizabeth G, MD  magnesium oxide (MAG-OX) 400 (241.3 Mg) MG tablet Take 400 mg by mouth every 14 (fourteen) days. Twice a month    [provider]  nitroGLYCERIN (NITROSTAT) 0.4 MG SL tablet Place 0.4 mg under the tongue every 5 (five) minutes as needed for chest pain.    [provider]  potassium chloride SA (K-DUR,KLOR-CON) 20 MEQ tablet Take 1 tablet (20 mEq total) by mouth daily. 12/04/18   Alwyn RenMathews, Elizabeth G, MD  ticagrelor (BRILINTA) 60 MG TABS tablet Take 1 tablet (60 mg total) by mouth 2 (two) times daily. Patient not taking: Reported on 01/13/2019 04/02/17   Eddie Northhungel, Nishant, MD    Family History Family History  Problem Relation Age of Onset  . Diabetes Mother   . Hypertension Mother     Social History Social History   Tobacco Use  . Smoking status: Current Some Day Smoker    Packs/day: 0.10    Years: 0.50    Pack years: 0.05     Types: Cigarettes  . Smokeless tobacco: Current User  . Tobacco comment: down to 3 cigarettes daily (08/03/14)  Substance Use Topics  . Alcohol use: No  . Drug use: No     Allergies   Aspirin; Effient [prasugrel]; Entresto [sacubitril-valsartan]; Lactose intolerance (gi); Robitussin dm [guaifenesin-dm]; Sulfa antibiotics; and Other   Review of Systems Review of Systems  Constitutional: Negative for fever.  Respiratory: Positive for shortness of breath.   Cardiovascular: Positive for leg swelling. Negative for chest pain.  Gastrointestinal: Positive for abdominal distention.  All other systems reviewed and are negative.    Physical Exam Updated Vital Signs BP (!) 173/84   Pulse 80   Temp 98.3 F (36.8 C) (Oral)   Resp 20   SpO2 95%   Physical Exam CONSTITUTIONAL: Chronically ill-appearing HEAD:  Normocephalic/atraumatic EYES: EOMI/PERRL ENMT: Mucous membranes moist NECK: supple no meningeal signs SPINE/BACK:entire spine nontender CV: S1/S2 noted LUNGS: Tachypnea, basilar crackles noted ABDOMEN: soft, distended, edema noted, no tenderness GU:no cva tenderness NEURO: Pt is awake/alert/appropriate, moves all extremitiesx4.  No facial droop.   EXTREMITIES: pulses normal/equal, full ROM, +edema noted to bilateral LE SKIN: warm, color normal PSYCH: no abnormalities of mood noted, alert and oriented to situation  ED Treatments / Results  Labs (all labs ordered are listed, but only abnormal results are displayed) Labs Reviewed  CBC WITH DIFFERENTIAL/PLATELET - Abnormal; Notable for the following components:      Result Value   RBC 5.74 (*)    Hemoglobin 15.5 (*)    HCT 51.7 (*)    RDW 18.6 (*)    All other components within normal limits  BRAIN NATRIURETIC PEPTIDE - Abnormal; Notable for the following components:   B Natriuretic Peptide 3,289.0 (*)    All other components within normal limits  COMPREHENSIVE METABOLIC PANEL - Abnormal; Notable for the following  components:   Creatinine, Ser 1.59 (*)    Alkaline Phosphatase 176 (*)    Total Bilirubin 3.0 (*)    GFR calc non Af Amer 33 (*)    GFR calc Af Amer 39 (*)    All other components within normal limits  SARS CORONAVIRUS 2 (HOSPITAL ORDER, PERFORMED IN [facility] LAB)    EKG EKG Interpretation  Date/Time:  Thursday April 20 2019 01:41:16 EDT Ventricular Rate:  80 PR Interval:    QRS Duration: 87 QT Interval:  396 QTC Calculation: 457 R Axis:   106 Text Interpretation:  Sinus rhythm Ventricular premature complex Anterior infarct, old LOW VOLTAGE No significant change since last tracing Confirmed by Zadie Rhine ([id number]) on 04/20/2019 1:43:47 AM   Radiology Dg Chest 2 View  Result Date: 04/19/2019 CLINICAL DATA:  68 year old female with shortness of breath. EXAM: CHEST - 2 VIEW COMPARISON:  Chest radiograph dated 01/13/2019 FINDINGS: Moderate cardiomegaly similar to prior radiograph. Mild vascular congestion. No edema. There is no focal consolidation or pneumothorax. Blunting of the right costophrenic angle similar to prior radiograph likely a small pleural effusion seen on the CT of 12/15/2018. The osseous structures are intact. IMPRESSION: Cardiomegaly with mild vascular congestion and small right pleural effusion. No significant interval change since the prior radiograph. Electronically Signed   By: Elgie Collard M.D.   On: 04/19/2019 19:07    Procedures Procedures   Medications Ordered in ED Medications  fentaNYL (SUBLIMAZE) injection 100 mcg (has no administration in time range)  benzonatate (TESSALON) capsule 100 mg (has no administration in time range)  furosemide (LASIX) injection 80 mg (80 mg Intravenous Given 04/20/19 0117)     Initial Impression / Assessment and Plan / ED Course  I have reviewed the triage vital signs and the nursing notes.  Pertinent labs & imaging results that were available during my care of the patient were reviewed by me and considered  in my medical decision making (see chart for details).        1:47 AM Patient with known history of CHF, last echo revealed EF of 15%.  She has significant peripheral edema.  She would need to be admitted for diuresis and mild CHF 3:20 AM Patient likely having increased CHF exacerbation.  Of note her LFTs are worsening with significant abdominal distention.  Patient could have ascites with some underlying liver disease. She will be admitted for blood pressure control, diuresis.  She is  in no acute respiratory distress.  Discussed with Dr. Toniann Fail for admission Final Clinical Impressions(s) / ED Diagnoses   Final diagnoses:  Acute systolic congestive heart failure [facility])  Peripheral edema    ED Discharge Orders    None       Zadie Rhine, MD 04/20/19 [phone]

## 2019-04-21 DIAGNOSIS — I5043 Acute on chronic combined systolic (congestive) and diastolic (congestive) heart failure: Secondary | ICD-10-CM

## 2019-04-21 DIAGNOSIS — R601 Generalized edema: Secondary | ICD-10-CM

## 2019-04-21 LAB — LIPID PANEL
Cholesterol: 133 mg/dL (ref 0–200)
HDL: 29 mg/dL — ABNORMAL LOW (ref >40)
LDL Cholesterol: 83 mg/dL (ref 0–99)
Total CHOL/HDL Ratio: 4.6 RATIO
Triglycerides: 106 mg/dL (ref <150)
VLDL: 21 mg/dL (ref 0–40)

## 2019-04-21 LAB — CBC WITH DIFFERENTIAL/PLATELET
Abs Immature Granulocytes: 0.03 10*3/uL (ref 0.00–0.07)
Basophils Absolute: 0.1 10*3/uL (ref 0.0–0.1)
Basophils Relative: 1 %
Eosinophils Absolute: 0.2 10*3/uL (ref 0.0–0.5)
Eosinophils Relative: 3 %
HCT: 45.1 % (ref 36.0–46.0)
Hemoglobin: 14 g/dL (ref 12.0–15.0)
Immature Granulocytes: 1 %
Lymphocytes Relative: 17 %
Lymphs Abs: 1 10*3/uL (ref 0.7–4.0)
MCH: 27.2 pg (ref 26.0–34.0)
MCHC: 31 g/dL (ref 30.0–36.0)
MCV: 87.6 fL (ref 80.0–100.0)
Monocytes Absolute: 0.7 10*3/uL (ref 0.1–1.0)
Monocytes Relative: 11 %
Neutro Abs: 4 10*3/uL (ref 1.7–7.7)
Neutrophils Relative %: 67 %
Platelets: 217 10*3/uL (ref 150–400)
RBC: 5.15 MIL/uL — ABNORMAL HIGH (ref 3.87–5.11)
RDW: 17.8 % — ABNORMAL HIGH (ref 11.5–15.5)
WBC: 5.9 10*3/uL (ref 4.0–10.5)
nRBC: 0 % (ref 0.0–0.2)

## 2019-04-21 LAB — COMPREHENSIVE METABOLIC PANEL
ALT: 13 U/L (ref 0–44)
AST: 19 U/L (ref 15–41)
Albumin: 3.3 g/dL — ABNORMAL LOW (ref 3.5–5.0)
Alkaline Phosphatase: 145 U/L — ABNORMAL HIGH (ref 38–126)
Anion gap: 13 (ref 5–15)
BUN: 21 mg/dL (ref 8–23)
CO2: 23 mmol/L (ref 22–32)
Calcium: 9.4 mg/dL (ref 8.9–10.3)
Chloride: 106 mmol/L (ref 98–111)
Creatinine, Ser: 1.64 mg/dL — ABNORMAL HIGH (ref 0.44–1.00)
GFR calc Af Amer: 37 mL/min — ABNORMAL LOW (ref >60)
GFR calc non Af Amer: 32 mL/min — ABNORMAL LOW (ref >60)
Glucose, Bld: 102 mg/dL — ABNORMAL HIGH (ref 70–99)
Potassium: 4.1 mmol/L (ref 3.5–5.1)
Sodium: 142 mmol/L (ref 135–145)
Total Bilirubin: 3.1 mg/dL — ABNORMAL HIGH (ref 0.3–1.2)
Total Protein: 6.6 g/dL (ref 6.5–8.1)

## 2019-04-21 LAB — TSH: TSH: 1.992 u[IU]/mL (ref 0.350–4.500)

## 2019-04-21 LAB — MAGNESIUM: Magnesium: 1.9 mg/dL (ref 1.7–2.4)

## 2019-04-21 MED ORDER — EZETIMIBE 10 MG PO TABS
10.0000 mg | ORAL_TABLET | Freq: Every day | ORAL | Status: DC
  Administered 2019-04-21 – 2019-04-28 (×8): 10 mg via ORAL
  Filled 2019-04-21 (×8): qty 1

## 2019-04-21 NOTE — Progress Notes (Signed)
PROGRESS NOTE   Deborah Ho  OQH:[id number]    DOB: 1951/05/28    DOA: 04/19/2019  PCP: [facility]   I have briefly reviewed patients previous medical records in [facility].  Brief Narrative:  68 year old female, lives alone, independent, PMH of CAD, anterior STEMI with early in-stent thrombosis due to missed Brilinta s/p DES x2 to LAD 04/2014, chronic combined CHF, ICM (TTE 11/28/2018: LVEF 15% with diffuse hypokinesis and grade 3 diastolic dysfunction), GERD, HTN, HLD, medical noncompliance, presented to [facility] ED on 04/19/2019 due to progressive generalized body swelling and 20 pound weight gain of approximately 2-week duration, progressive dyspnea of 1 week duration after stopping her home Bumex which she states has not been working for her.  Admitted for decompensated CHF/anasarca.  Cardiology consulted.  IV Diuresis ongoing.   Assessment & Plan:   Principal Problem:   Acute on chronic systolic CHF (congestive heart failure) (HCC) Active Problems:   Essential hypertension   CKD (chronic kidney disease), stage III (HCC)   Acute CHF (congestive heart failure) (HCC)   Acute on chronic combined systolic and diastolic CHF/ICM  TTE 11/28/2018: LVEF 15% with diffuse hypokinesis and grade 3 diastolic dysfunction  Reports that she quit taking Bumex approximately a week PTA because it was not working well for her.  Chronic issues with noncompliance as per cardiology.  Started on IV Lasix 80 mg every 12 hourly.  Strict intake output, daily weights and fluid restriction.  Not on ACEI/ARB due to chronic kidney disease.  Patient still is severely volume overloaded and may take several days to stabilize.    Cardiology input appreciated.  Put out 3.4 L urine yesterday/-2 L since admission.  3 pound weight loss since admission.  Anasarca  Secondary to decompensated CHF and chronic kidney disease.  Although she has proteinuria, not sure if she has nephrotic  range proteinuria.  Unable to complete 24-hour urine protein in January 2020.  Diuretic management as noted above.  Little improvement compared to yesterday.  CAD status post PCI  No chest pain reported.  Continue Brilinta, atorvastatin, Imdur and carvedilol.  Added Zetia for additional LDL reduction.  Essential hypertension  Controlled  Continue carvedilol 3.125 mg twice daily and Imdur 30 mg daily.  Also on IV Lasix 80 mg every 12 hours.  Added PRN IV hydralazine.  Monitor and adjust medications as needed.  Hyperlipidemia  Continue statins.  Zetia added by cardiology  Stage III chronic kidney disease  Baseline creatinine may be in the 1.3-1.6 range.  Presented with creatinine of 1.59.  Slightly gone up to 1.6 but still within her baseline.  Follow BMP closely while on aggressive IV diuresis.  Morbid obesity/Body mass index is 40.45 kg/m.    DVT prophylaxis: Lovenox Code Status: Full Family Communication: None at bedside Disposition: DC home pending clinical improvement, may be couple more days.   Consultants:  Cardiology  Procedures:  None  Antimicrobials:  None   Subjective: Urinating well.  Leg swelling slightly better.  No dyspnea or chest pain.  ROS: As above, otherwise negative.  Objective:  Vitals:   04/20/19 1514 04/20/19 1740 04/20/19 2036 04/21/19 0526  BP: 126/72 125/77 122/82 130/73  Pulse: 71 70 74 72  Resp: 18 (!) 21    Temp: 97.6 F (36.4 C)  (!) 97.4 F (36.3 C) 97.7 F (36.5 C)  TempSrc: Oral  Oral Oral  SpO2: 96% 95% 100% 95%  Weight:    110.3 kg  Height:  Examination:  General exam: Pleasant middle-aged female, moderately built and morbidly obese sitting up comfortably in bed without distress. Respiratory system: Clear to auscultation.  No increased work of breathing. Cardiovascular system: S1 & S2 heard, RRR. No JVD, murmurs, rubs, gallops or clicks.  3+ pitting leg edema extending to anterior abdominal  wall and presacral edema.  Also has 1+ pitting edema of bilateral upper extremities.  Not much change in her extremity edema.  Telemetry personally reviewed: Sinus rhythm, low voltage with BBB morphology.?  Occasional A. fib. Gastrointestinal system: Abdomen is nondistended, soft and nontender. No organomegaly or masses felt. Normal bowel sounds heard.  Stable. Central nervous system: Alert and oriented. No focal neurological deficits. Extremities: Symmetric 5 x 5 power. Skin: No rashes, lesions or ulcers Psychiatry: Judgement and insight appear normal. Mood & affect appropriate.     Data Reviewed: I have personally reviewed following labs and imaging studies  CBC: Recent Labs  Lab 04/20/19 0045 04/21/19 0441  WBC 6.6 5.9  NEUTROABS 4.3 4.0  HGB 15.5* 14.0  HCT 51.7* 45.1  MCV 90.1 87.6  PLT 267 217   Basic Metabolic Panel: Recent Labs  Lab 04/20/19 0045 04/21/19 0441  NA 142 142  K 4.6 4.1  CL 105 106  CO2 24 23  GLUCOSE 95 102*  BUN 19 21  CREATININE 1.59* 1.64*  CALCIUM 9.8 9.4  MG  --  1.9   Liver Function Tests: Recent Labs  Lab 04/20/19 0045 04/21/19 0441  AST 24 19  ALT 12 13  ALKPHOS 176* 145*  BILITOT 3.0* 3.1*  PROT 7.4 6.6  ALBUMIN 3.6 3.3*   Cardiac Enzymes: Recent Labs  Lab 04/20/19 1004 04/20/19 1209 04/20/19 1817  TROPONINI <0.03 <0.03 <0.03     Recent Results (from the past 240 hour(s))  SARS Coronavirus 2 (CEPHEID - Performed in [facility] hospital lab), Hosp Order     Status: None   Collection Time: 04/20/19  1:12 AM  Result Value Ref Range Status   SARS Coronavirus 2 NEGATIVE NEGATIVE Final    Comment: (NOTE) If result is NEGATIVE SARS-CoV-2 target nucleic acids are NOT DETECTED. The SARS-CoV-2 RNA is generally detectable in upper and lower  respiratory specimens during the acute phase of infection. The lowest  concentration of SARS-CoV-2 viral copies this assay can detect is 250  copies / mL. A negative result does not  preclude SARS-CoV-2 infection  and should not be used as the sole basis for treatment or other  patient management decisions.  A negative result may occur with  improper specimen collection / handling, submission of specimen other  than nasopharyngeal swab, presence of viral mutation(s) within the  areas targeted by this assay, and inadequate number of viral copies  (<250 copies / mL). A negative result must be combined with clinical  observations, patient history, and epidemiological information. If result is POSITIVE SARS-CoV-2 target nucleic acids are DETECTED. The SARS-CoV-2 RNA is generally detectable in upper and lower  respiratory specimens dur ing the acute phase of infection.  Positive  results are indicative of active infection with SARS-CoV-2.  Clinical  correlation with patient history and other diagnostic information is  necessary to determine patient infection status.  Positive results do  not rule out bacterial infection or co-infection with other viruses. If result is PRESUMPTIVE POSTIVE SARS-CoV-2 nucleic acids MAY BE PRESENT.   A presumptive positive result was obtained on the submitted specimen  and confirmed on repeat testing.  While 2019 novel coronavirus  (  SARS-CoV-2) nucleic acids may be present in the submitted sample  additional confirmatory testing may be necessary for epidemiological  and / or clinical management purposes  to differentiate between  SARS-CoV-2 and other Sarbecovirus currently known to infect humans.  If clinically indicated additional testing with an alternate test  methodology [phone]) is advised. The SARS-CoV-2 RNA is generally  detectable in upper and lower respiratory sp ecimens during the acute  phase of infection. The expected result is Negative. Fact Sheet for Patients:  BoilerBrush.com.cy Fact Sheet for Healthcare Providers: https://pope.com/ This test is not yet approved or cleared by  the [place] FDA and has been authorized for detection and/or diagnosis of SARS-CoV-2 by FDA under an Emergency Use Authorization (EUA).  This EUA will remain in effect (meaning this test can be used) for the duration of the COVID-19 declaration under Section 564(b)(1) of the Act, 21 U.S.C. section 360bbb-3(b)(1), unless the authorization is terminated or revoked sooner. Performed at [facility], [address] [place], [place] [address]          Radiology Studies: Dg Chest 2 View  Result Date: 04/19/2019 CLINICAL DATA:  68 year old female with shortness of breath. EXAM: CHEST - 2 VIEW COMPARISON:  Chest radiograph dated 01/13/2019 FINDINGS: Moderate cardiomegaly similar to prior radiograph. Mild vascular congestion. No edema. There is no focal consolidation or pneumothorax. Blunting of the right costophrenic angle similar to prior radiograph likely a small pleural effusion seen on the CT of 12/15/2018. The osseous structures are intact. IMPRESSION: Cardiomegaly with mild vascular congestion and small right pleural effusion. No significant interval change since the prior radiograph. Electronically Signed   By: Elgie Collard M.D.   On: 04/19/2019 19:07        Scheduled Meds: . atorvastatin  40 mg Oral q1800  . carvedilol  3.125 mg Oral BID WC  . enoxaparin (LOVENOX) injection  40 mg Subcutaneous Daily  . ezetimibe  10 mg Oral Daily  . furosemide  80 mg Intravenous BID  . isosorbide mononitrate  30 mg Oral Daily  . magnesium oxide  400 mg Oral Q14 Days  . potassium chloride SA  20 mEq Oral Daily  . ticagrelor  60 mg Oral BID   Continuous Infusions:   LOS: 1 day     Marcellus Scott, MD, FACP, [facility]. [facility]  To contact the attending provider between 7A-7P or the covering provider during after hours 7P-7A, please log into the web site [email or web address] and access using universal [place] password for that web site. If you do not have the password,  please call the hospital operator.  04/21/2019, 5:43 PM

## 2019-04-21 NOTE — Progress Notes (Addendum)
DAILY PROGRESS NOTE   Patient Name: Natajah Rybinski Date of Encounter: 04/21/2019 Cardiologist: Peter Swaziland, MD  Chief Complaint   Breathing is better today  Patient Profile   Jeremiah Ostheimer is a 68 y.o. female with a hx of Chronic combined systolic and diastolic heart failure with last EF 18% in 12/2018, CAD with ant STEMI due to early stent thrombosis for missing doses of Brilinta-had PCI X2 to LAD 2015, ischemic cardiomyopathy, CKD stage 3, Morbid obesity, Hypertension, hyperlipidemia who is being seen today for the evaluation of CHF at the request of Dr. Waymon Amato.  Subjective   Diuresed 2.4L negative -  Mild increase in creatinine from 1.59 to 1.64. Weight down 1KG. Troponin negative x 3. BNP was high at 3289. LDL 106 - above goal <70.  Objective   Vitals:   04/20/19 1514 04/20/19 1740 04/20/19 2036 04/21/19 0526  BP: 126/72 125/77 122/82 130/73  Pulse: 71 70 74 72  Resp: 18 (!) 21    Temp: 97.6 F (36.4 C)  (!) 97.4 F (36.3 C) 97.7 F (36.5 C)  TempSrc: Oral  Oral Oral  SpO2: 96% 95% 100% 95%  Weight:    110.3 kg  Height:        Intake/Output Summary (Last 24 hours) at 04/21/2019 1041 Last data filed at 04/21/2019 0913 Gross per 24 hour  Intake 1152 ml  Output 3050 ml  Net -1898 ml   Filed Weights   04/20/19 0421 04/21/19 0526  Weight: 111.6 kg 110.3 kg    Physical Exam   General appearance: alert, no distress and morbidly obese Lungs: clear to auscultation bilaterally Heart: regular rate and rhythm Extremities: edema 2-3+ bilateral LE edema Pulses: 2+ and symmetric Neurologic: Grossly normal  Inpatient Medications    Scheduled Meds: . atorvastatin  40 mg Oral q1800  . carvedilol  3.125 mg Oral BID WC  . enoxaparin (LOVENOX) injection  40 mg Subcutaneous Daily  . furosemide  80 mg Intravenous BID  . isosorbide mononitrate  30 mg Oral Daily  . magnesium oxide  400 mg Oral Q14 Days  . potassium chloride SA  20 mEq Oral Daily  . ticagrelor  60 mg Oral  BID    Continuous Infusions:   PRN Meds: acetaminophen **OR** acetaminophen, benzonatate, nitroGLYCERIN, ondansetron **OR** ondansetron (ZOFRAN) IV   Labs   Results for orders placed or performed during the hospital encounter of 04/19/19 (from the past 48 hour(s))  CBC with Differential/Platelet     Status: Abnormal   Collection Time: 04/20/19 12:45 AM  Result Value Ref Range   WBC 6.6 4.0 - 10.5 K/uL   RBC 5.74 (H) 3.87 - 5.11 MIL/uL   Hemoglobin 15.5 (H) 12.0 - 15.0 g/dL   HCT 92.9 (H) 57.4 - 73.4 %   MCV 90.1 80.0 - 100.0 fL   MCH 27.0 26.0 - 34.0 pg   MCHC 30.0 30.0 - 36.0 g/dL   RDW 03.7 (H) 09.6 - 43.8 %   Platelets 267 150 - 400 K/uL   nRBC 0.0 0.0 - 0.2 %   Neutrophils Relative % 65 %   Neutro Abs 4.3 1.7 - 7.7 K/uL   Lymphocytes Relative 20 %   Lymphs Abs 1.3 0.7 - 4.0 K/uL   Monocytes Relative 12 %   Monocytes Absolute 0.8 0.1 - 1.0 K/uL   Eosinophils Relative 2 %   Eosinophils Absolute 0.1 0.0 - 0.5 K/uL   Basophils Relative 1 %   Basophils Absolute 0.1 0.0 - 0.1 K/uL  Immature Granulocytes 0 %   Abs Immature Granulocytes 0.02 0.00 - 0.07 K/uL    Comment: Performed at [facility], [address], [place], [place] [address]  Brain natriuretic peptide     Status: Abnormal   Collection Time: 04/20/19 12:45 AM  Result Value Ref Range   B Natriuretic Peptide 3,289.0 (H) 0.0 - 100.0 pg/mL    Comment: Performed at [facility], [address], [place], [place] [address]  Comprehensive metabolic panel     Status: Abnormal   Collection Time: 04/20/19 12:45 AM  Result Value Ref Range   Sodium 142 135 - 145 mmol/L   Potassium 4.6 3.5 - 5.1 mmol/L   Chloride 105 98 - 111 mmol/L   CO2 24 22 - 32 mmol/L   Glucose, Bld 95 70 - 99 mg/dL   BUN 19 8 - 23 mg/dL   Creatinine, Ser 1.59 (H) 0.44 - 1.00 mg/dL   Calcium 9.8 8.9 - 47.0 mg/dL   Total Protein 7.4 6.5 - 8.1 g/dL   Albumin 3.6 3.5 - 5.0 g/dL   AST 24 15 - 41 U/L   ALT 12 0 - 44 U/L   Alkaline  Phosphatase 176 (H) 38 - 126 U/L   Total Bilirubin 3.0 (H) 0.3 - 1.2 mg/dL   GFR calc non Af Amer 33 (L) >60 mL/min   GFR calc Af Amer 39 (L) >60 mL/min   Anion gap 13 5 - 15    Comment: Performed at [facility], [address] [place], [place] [address]  SARS Coronavirus 2 (CEPHEID - Performed in [facility] hospital lab), Hosp Order     Status: None   Collection Time: 04/20/19  1:12 AM  Result Value Ref Range   SARS Coronavirus 2 NEGATIVE NEGATIVE    Comment: (NOTE) If result is NEGATIVE SARS-CoV-2 target nucleic acids are NOT DETECTED. The SARS-CoV-2 RNA is generally detectable in upper and lower  respiratory specimens during the acute phase of infection. The lowest  concentration of SARS-CoV-2 viral copies this assay can detect is 250  copies / mL. A negative result does not preclude SARS-CoV-2 infection  and should not be used as the sole basis for treatment or other  patient management decisions.  A negative result may occur with  improper specimen collection / handling, submission of specimen other  than nasopharyngeal swab, presence of viral mutation(s) within the  areas targeted by this assay, and inadequate number of viral copies  (<250 copies / mL). A negative result must be combined with clinical  observations, patient history, and epidemiological information. If result is POSITIVE SARS-CoV-2 target nucleic acids are DETECTED. The SARS-CoV-2 RNA is generally detectable in upper and lower  respiratory specimens dur ing the acute phase of infection.  Positive  results are indicative of active infection with SARS-CoV-2.  Clinical  correlation with patient history and other diagnostic information is  necessary to determine patient infection status.  Positive results do  not rule out bacterial infection or co-infection with other viruses. If result is PRESUMPTIVE POSTIVE SARS-CoV-2 nucleic acids MAY BE PRESENT.   A presumptive positive result was obtained on the  submitted specimen  and confirmed on repeat testing.  While 2019 novel coronavirus  (SARS-CoV-2) nucleic acids may be present in the submitted sample  additional confirmatory testing may be necessary for epidemiological  and / or clinical management purposes  to differentiate between  SARS-CoV-2 and other Sarbecovirus currently known to infect humans.  If clinically indicated additional testing with an alternate test  methodology [phone]) is advised. The SARS-CoV-2 RNA is generally  detectable in upper and lower respiratory sp ecimens during the acute  phase of infection. The expected result is Negative. Fact Sheet for Patients:  BoilerBrush.com.cy Fact Sheet for Healthcare Providers: https://pope.com/ This test is not yet approved or cleared by the [place] FDA and has been authorized for detection and/or diagnosis of SARS-CoV-2 by FDA under an Emergency Use Authorization (EUA).  This EUA will remain in effect (meaning this test can be used) for the duration of the COVID-19 declaration under Section 564(b)(1) of the Act, 21 U.S.C. section 360bbb-3(b)(1), unless the authorization is terminated or revoked sooner. Performed at [facility], [address], [place], [place] [address]   Troponin I - Now Then Q6H     Status: None   Collection Time: 04/20/19 10:04 AM  Result Value Ref Range   Troponin I <0.03 <0.03 ng/mL    Comment: Performed at [facility], [address], [place], [place] [address]  Troponin I - Now Then Q6H     Status: None   Collection Time: 04/20/19 12:09 PM  Result Value Ref Range   Troponin I <0.03 <0.03 ng/mL    Comment: Performed at [facility], [address], [place], [place] [address]  Troponin I - Now Then Q6H     Status: None   Collection Time: 04/20/19  6:17 PM  Result Value Ref Range   Troponin I <0.03 <0.03 ng/mL    Comment: Performed at [facility], [address]  [place], [place] [address]  CBC WITH DIFFERENTIAL     Status: Abnormal   Collection Time: 04/21/19  4:41 AM  Result Value Ref Range   WBC 5.9 4.0 - 10.5 K/uL   RBC 5.15 (H) 3.87 - 5.11 MIL/uL   Hemoglobin 14.0 12.0 - 15.0 g/dL   HCT 28.7 86.7 - 67.2 %   MCV 87.6 80.0 - 100.0 fL   MCH 27.2 26.0 - 34.0 pg   MCHC 31.0 30.0 - 36.0 g/dL   RDW 09.4 (H) 70.9 - 62.8 %   Platelets 217 150 - 400 K/uL   nRBC 0.0 0.0 - 0.2 %   Neutrophils Relative % 67 %   Neutro Abs 4.0 1.7 - 7.7 K/uL   Lymphocytes Relative 17 %   Lymphs Abs 1.0 0.7 - 4.0 K/uL   Monocytes Relative 11 %   Monocytes Absolute 0.7 0.1 - 1.0 K/uL   Eosinophils Relative 3 %   Eosinophils Absolute 0.2 0.0 - 0.5 K/uL   Basophils Relative 1 %   Basophils Absolute 0.1 0.0 - 0.1 K/uL   Immature Granulocytes 1 %   Abs Immature Granulocytes 0.03 0.00 - 0.07 K/uL    Comment: Performed at [facility], [address] [place], [place] [address]  Comprehensive metabolic panel     Status: Abnormal   Collection Time: 04/21/19  4:41 AM  Result Value Ref Range   Sodium 142 135 - 145 mmol/L   Potassium 4.1 3.5 - 5.1 mmol/L   Chloride 106 98 - 111 mmol/L   CO2 23 22 - 32 mmol/L   Glucose, Bld 102 (H) 70 - 99 mg/dL   BUN 21 8 - 23 mg/dL   Creatinine, Ser 4.76 (H) 0.44 - 1.00 mg/dL   Calcium 9.4 8.9 - 54.6 mg/dL   Total Protein 6.6 6.5 - 8.1 g/dL  Albumin 3.3 (L) 3.5 - 5.0 g/dL   AST 19 15 - 41 U/L   ALT 13 0 - 44 U/L   Alkaline Phosphatase 145 (H) 38 - 126 U/L   Total Bilirubin 3.1 (H) 0.3 - 1.2 mg/dL   GFR calc non Af Amer 32 (L) >60 mL/min   GFR calc Af Amer 37 (L) >60 mL/min   Anion gap 13 5 - 15    Comment: Performed at [facility], [address] [place], [place] [address]  Magnesium     Status: None   Collection Time: 04/21/19  4:41 AM  Result Value Ref Range   Magnesium 1.9 1.7 - 2.4 mg/dL    Comment: Performed at [facility], [address] [place], [place] [address]  TSH     Status: None   Collection  Time: 04/21/19  4:41 AM  Result Value Ref Range   TSH 1.992 0.350 - 4.500 uIU/mL    Comment: Performed by a 3rd Generation assay with a functional sensitivity of <=0.01 uIU/mL. Performed at [facility], [address] [place], [place] [address]   Lipid panel     Status: Abnormal   Collection Time: 04/21/19  4:41 AM  Result Value Ref Range   Cholesterol 133 0 - 200 mg/dL   Triglycerides 629106 <528<150 mg/dL   HDL 29 (L) >41>40 mg/dL   Total CHOL/HDL Ratio 4.6 RATIO   VLDL 21 0 - 40 mg/dL   LDL Cholesterol 83 0 - 99 mg/dL    Comment:        Total Cholesterol/HDL:CHD Risk Coronary Heart Disease Risk Table                     Men   Women  1/2 Average Risk   3.4   3.3  Average Risk       5.0   4.4  2 X Average Risk   9.6   7.1  3 X Average Risk  23.4   11.0        Use the calculated Patient Ratio above and the CHD Risk Table to determine the patient's CHD Risk.        ATP III CLASSIFICATION (LDL):  <100     mg/dL   Optimal  [id number]  mg/dL   Near or Above                    Optimal  130-159  mg/dL   Borderline  [phone]  mg/dL   High  >664>190     mg/dL   Very High Performed at [facility], [address] [place], [place] [address]     ECG   N/A  Telemetry   Sinus rhythm with PVC's - Personally Reviewed  Radiology    Dg Chest 2 View  Result Date: 04/19/2019 CLINICAL DATA:  68 year old female with shortness of breath. EXAM: CHEST - 2 VIEW COMPARISON:  Chest radiograph dated 01/13/2019 FINDINGS: Moderate cardiomegaly similar to prior radiograph. Mild vascular congestion. No edema. There is no focal consolidation or pneumothorax. Blunting of the right costophrenic angle similar to prior radiograph likely a small pleural effusion seen on the CT of 12/15/2018. The osseous structures are intact. IMPRESSION: Cardiomegaly with mild vascular congestion and small right pleural effusion. No significant interval change since the prior radiograph. Electronically Signed   By: Elgie CollardArash   Radparvar M.D.   On: 04/19/2019 19:07    Cardiac Studies  N/A  Assessment   1. Principal Problem: 2.   Acute on chronic systolic CHF (congestive heart failure) (HCC) 3. Active Problems: 4.   Essential hypertension 5.   CKD (chronic kidney disease), stage III (HCC) 6.   Acute CHF (congestive heart failure) (HCC) 7.   Plan   1. Good response to diuretics overnight - mild increase in creatinine. Will need several more days of IV diuretics - continue current plan. Add ezetimibe for additional LDL reduction given history of CAD.  Time Spent Directly with Patient:  I have spent a total of 25 minutes with the patient reviewing hospital notes, telemetry, EKGs, labs and examining the patient as well as establishing an assessment and plan that was discussed personally with the patient.  > 50% of time was spent in direct patient care.  Length of Stay:  LOS: 1 day   Chrystie NoseKenneth C. Hilty, MD, [facility] SystemFACC, FACP  [facility]  Medical Director of the Advanced Lipid Disorders &  Cardiovascular Risk Reduction Clinic Diplomate of the American Board of Clinical Lipidology Attending Cardiologist  Direct Dial: [phone]  Fax: [phone]  Website:  [email or web address]  Kenneth C Hilty 04/21/2019, 10:41 AM

## 2019-04-22 ENCOUNTER — Inpatient Hospital Stay (HOSPITAL_COMMUNITY): Payer: Medicare Other

## 2019-04-22 DIAGNOSIS — I420 Dilated cardiomyopathy: Secondary | ICD-10-CM

## 2019-04-22 LAB — BASIC METABOLIC PANEL
Anion gap: 11 (ref 5–15)
BUN: 22 mg/dL (ref 8–23)
CO2: 25 mmol/L (ref 22–32)
Calcium: 9.2 mg/dL (ref 8.9–10.3)
Chloride: 107 mmol/L (ref 98–111)
Creatinine, Ser: 1.73 mg/dL — ABNORMAL HIGH (ref 0.44–1.00)
GFR calc Af Amer: 35 mL/min — ABNORMAL LOW (ref >60)
GFR calc non Af Amer: 30 mL/min — ABNORMAL LOW (ref >60)
Glucose, Bld: 91 mg/dL (ref 70–99)
Potassium: 3.9 mmol/L (ref 3.5–5.1)
Sodium: 143 mmol/L (ref 135–145)

## 2019-04-22 LAB — COOXEMETRY PANEL
Carboxyhemoglobin: 2 % — ABNORMAL HIGH (ref 0.5–1.5)
Methemoglobin: 0.6 % (ref 0.0–1.5)
O2 Saturation: 66.3 %
Total hemoglobin: 13.8 g/dL (ref 12.0–16.0)

## 2019-04-22 MED ORDER — SODIUM CHLORIDE 0.9 % IV SOLN: INTRAVENOUS | Status: DC

## 2019-04-22 MED ORDER — ATORVASTATIN CALCIUM 80 MG PO TABS
80.0000 mg | ORAL_TABLET | Freq: Every day | ORAL | Status: DC
  Administered 2019-04-22 – 2019-04-27 (×6): 80 mg via ORAL
  Filled 2019-04-22 (×6): qty 1

## 2019-04-22 MED ORDER — HYDRALAZINE HCL 10 MG PO TABS
10.0000 mg | ORAL_TABLET | Freq: Three times a day (TID) | ORAL | Status: DC
  Administered 2019-04-22: 16:00:00 10 mg via ORAL
  Filled 2019-04-22 (×2): qty 1

## 2019-04-22 MED ORDER — LIDOCAINE HCL 1 % IJ SOLN
INTRAMUSCULAR | Status: DC | PRN
  Administered 2019-04-22: 10 mL

## 2019-04-22 MED ORDER — LIDOCAINE HCL 1 % IJ SOLN
INTRAMUSCULAR | Status: AC
  Filled 2019-04-22: qty 20

## 2019-04-22 MED ORDER — SODIUM CHLORIDE 0.9 % IV SOLN
12.0000 mg | Freq: Once | INTRAVENOUS | Status: DC
  Filled 2019-04-22: qty 12

## 2019-04-22 MED ORDER — LORATADINE 10 MG PO TABS
10.0000 mg | ORAL_TABLET | Freq: Every day | ORAL | Status: DC | PRN
  Administered 2019-04-24: 10:00:00 10 mg via ORAL
  Filled 2019-04-22 (×2): qty 1

## 2019-04-22 NOTE — Progress Notes (Signed)
Progress Note  Patient Name: Deborah Ho Date of Encounter: 04/22/2019  Primary Cardiologist: Peter Martinique, MD   Subjective   Still volume overloaded.  Good diuresis with 2.4L out overnight.  Still complains of swelling in legs and abdomen but SOB has improved  Inpatient Medications    Scheduled Meds: . atorvastatin  40 mg Oral q1800  . carvedilol  3.125 mg Oral BID WC  . enoxaparin (LOVENOX) injection  40 mg Subcutaneous Daily  . ezetimibe  10 mg Oral Daily  . furosemide  80 mg Intravenous BID  . isosorbide mononitrate  30 mg Oral Daily  . magnesium oxide  400 mg Oral Q14 Days  . potassium chloride SA  20 mEq Oral Daily  . ticagrelor  60 mg Oral BID   Continuous Infusions:  PRN Meds: acetaminophen **OR** acetaminophen, benzonatate, loratadine, nitroGLYCERIN, ondansetron **OR** ondansetron (ZOFRAN) IV   Vital Signs    Vitals:   04/21/19 2000 04/21/19 2109 04/22/19 0630 04/22/19 0858  BP:  127/78 122/76 129/72  Pulse:  69 66 70  Resp: (!) 22     Temp:  97.8 F (36.6 C) (!) 97.4 F (36.3 C)   TempSrc:  Oral Oral   SpO2:  100% 95%   Weight:   108.5 kg   Height:        Intake/Output Summary (Last 24 hours) at 04/22/2019 1024 Last data filed at 04/22/2019 0900 Gross per 24 hour  Intake 1060 ml  Output 2650 ml  Net -1590 ml   Filed Weights   04/20/19 0421 04/21/19 0526 04/22/19 0630  Weight: 111.6 kg 110.3 kg 108.5 kg    Telemetry    NSR - Personally Reviewed  ECG    No new EKG to review - Personally Reviewed  Physical Exam   GEN: No acute distress.   Neck: No JVD Cardiac: RRR, no murmurs, rubs, or gallops.  Respiratory: Clear to auscultation bilaterally. GI: Soft, nontender, non-distended  MS: 2+ edema; No deformity. Neuro:  Nonfocal  Psych: Normal affect   Labs    Chemistry Recent Labs  Lab 04/20/19 0045 04/21/19 0441 04/22/19 0343  NA 142 142 143  K 4.6 4.1 3.9  CL 105 106 107  CO2 24 23 25   GLUCOSE 95 102* 91  BUN 19 21 22    CREATININE 1.59* 1.64* 1.73*  CALCIUM 9.8 9.4 9.2  PROT 7.4 6.6  --   ALBUMIN 3.6 3.3*  --   AST 24 19  --   ALT 12 13  --   ALKPHOS 176* 145*  --   BILITOT 3.0* 3.1*  --   GFRNONAA 33* 32* 30*  GFRAA 39* 37* 35*  ANIONGAP 13 13 11      Hematology Recent Labs  Lab 04/20/19 0045 04/21/19 0441  WBC 6.6 5.9  RBC 5.74* 5.15*  HGB 15.5* 14.0  HCT 51.7* 45.1  MCV 90.1 87.6  MCH 27.0 27.2  MCHC 30.0 31.0  RDW 18.6* 17.8*  PLT 267 217    Cardiac Enzymes Recent Labs  Lab 04/20/19 1004 04/20/19 1209 04/20/19 1817  TROPONINI <0.03 <0.03 <0.03   No results for input(s): TROPIPOC in the last 168 hours.   BNP Recent Labs  Lab 04/20/19 0045  BNP 3,289.0*     DDimer No results for input(s): DDIMER in the last 168 hours.   Radiology    No results found.  Cardiac Studies   none  Patient Profile     68 y.o. female with a hx of Chronic  combined systolic and diastolic heart failure with last EF 18% in 12/2018, CAD with ant STEMI due to early stent thrombosis for missing doses of Brilinta-had PCI X2 to LAD 2015, ischemic cardiomyopathy, CKD stage 3, Morbid obesity, Hypertension, hyperlipidemiawho is being seen today for the evaluation ofCHF.  Assessment & Plan    1. Acute on chronic combined diastolic and systolic heart failure -Echo from 11/28/2018 showed LVEF of 15% with diffuse hypokinesis and grade 3 diastolic dysfunction. -Treated with carvedilol, Imdur, Bumex 2 mg daily. Not on ACE-I/ARB due to CKD. Unclear compliance with her diuretic.  -Pt presented for profound edema and oozing of her legs. She has mild shortness of breath but denies orthopnea.  -BNP 3289. Troponins negative.  -CXR consistent with mild CHF -Admission wt 246 lbs. Discharge wt from hospitalization in March  was 214 lbs. Up 32 pounds.  -She has had good diuresis with Lasix 80 mg IV BID but remains volume overloaded.  She put out 2.4 L yesterday and is net -3.3 L.  Her weight is down 6 pounds from  admission.   -Creatinine bumped from 1.64-1.73 today likely due to continued volume overload with passive congestion as well as poor cardiac output.. -Strict I&O and daily weights.  -fluid restriction to <2 L daily and sodium restriction < 2,000 mg daily. -Pt has been non-compliant with follow up and no longer seen in the advance heart failure clinic due to repeated no show. She needs to be more proactive with HF monitoring and fluid management.  -I am going to have a PICC line placed and get a co-ox to help guide management. -She will likely need to have advanced heart failure consult -Continue carvedilol 3.125 mg twice daily, Imdur 30 mg daily and add hydralazine 10 mg 3 times daily. -Not use ACE-I/ARB/Spiro due to acute on CKD -Continue Lasix at 80 mg IV twice daily for now until we get results of Co ox  2. CAD with ischemic cardiomyopathy -Continue Brilinta, beta-blocker, and high-intensity statin.  -No current chest pain.   3. Hypertension -On low dose carvedilol, Imdur -BP controlled today at 122/76 mmHg -Recommend adding hydralazine 10 mg 3 times daily especially in the setting of LV dysfunction with inability to use ACE or ARB due to CKD.  Unfortunately she has had issues with medical compliance in the past and may need to consider switching to BiDil as she may not be compliant having to take 3 times daily -Would hold off in increasing beta-blocker in the setting of acute CHF  4. Hyperlipidemia -On high intensity statin with atorvastatin 40 mg daily -LDL by last lipid panel in 03/2018 was 81 repeat yesterday was 83.  His goal is less than 70 -Increase atorvastatin to 80 mg daily and repeat FLP and ALT in 6 weeks  5. CKD stage 3 -Baseline SCr ~1.3-1.5 (occ up to 1.6-1.7). SCr up to 1.73 today. K+ normal at 3.9 -Monitor renal function with diuresis.   6. Moderate pulmonary Hypertension - Moderate on Echo in 11/2018 with PASP of 53 mmHg. This is significantly higher than Echo  from 11/2017 when PASP was 31 mmHg. Right ventricle was also noted to be moderately dilated with mild dysfunction. -Needs diuresis.  -Likely would benefit from right heart cath    For questions or updates, please contact [facility] Please consult [email or web address] for contact info under Cardiology/STEMI.      Signed, Armanda Magic, MD  04/22/2019, 10:24 AM

## 2019-04-22 NOTE — Progress Notes (Signed)
PROGRESS NOTE   Deborah Ho  INO:[id number]    DOB: [birth year]    DOA: 04/19/2019  PCP: [facility]   I have briefly reviewed patients previous medical records in [facility].  Brief Narrative:  68 year old female, lives alone, independent, PMH of CAD, anterior STEMI with early in-stent thrombosis due to missed Brilinta s/p DES x2 to LAD 04/2014, chronic combined CHF, ICM (TTE 11/28/2018: LVEF 15% with diffuse hypokinesis and grade 3 diastolic dysfunction), GERD, HTN, HLD, medical noncompliance, presented to [facility] ED on 04/19/2019 due to progressive generalized body swelling and 20 pound weight gain of approximately 2-week duration, progressive dyspnea of 1 week duration after stopping her home Bumex which she states has not been working for her.  Admitted for decompensated CHF/anasarca.  Cardiology consulted.  IV Diuresis ongoing.   Assessment & Plan:   Principal Problem:   Acute on chronic systolic CHF (congestive heart failure) (HCC) Active Problems:   Essential hypertension   CKD (chronic kidney disease), stage III (HCC)   Acute CHF (congestive heart failure) (HCC)   Acute on chronic combined systolic and diastolic CHF/ICM  TTE 11/28/2018: LVEF 15% with diffuse hypokinesis and grade 3 diastolic dysfunction  Reports that she quit taking Bumex approximately a week PTA because it was not working well for her.  Chronic issues with noncompliance as per cardiology.  Started on IV Lasix 80 mg every 12 hourly.  Strict intake output, daily weights and fluid restriction.  Not on ACEI/ARB due to chronic kidney disease.  Patient still is severely volume overloaded and may take several days to stabilize.    Cardiology input appreciated.  Ongoing diuresis but still with significant volume overload.  Discussed with Dr. Mayford Knife, cardiology who plans to place a PICC line for co-ox measurement to help guide management and probably consult the heart failure  team.  Hydralazine 10 mg 3 times daily added.  Anasarca  Secondary to decompensated CHF and chronic kidney disease.  Although she has proteinuria, not sure if she has nephrotic range proteinuria.  Unable to complete 24-hour urine protein in January 2020.  Diuretic management as noted above.  Volume status slowly improving.  CAD status post PCI  No chest pain reported.  Continue Brilinta, atorvastatin, Imdur and carvedilol.  Added Zetia for additional LDL reduction.  Essential hypertension  Controlled  Continue carvedilol 3.125 mg twice daily and Imdur 30 mg daily.  Also on IV Lasix 80 mg every 12 hours.  Added PRN IV hydralazine.  Cardiology added hydralazine 10 mg 3 times daily.  Patient may not be compliant with BiDil given her history of compliance issues.  Hyperlipidemia  Atorvastatin increased to 80 mg daily. LDL 83 this admission.  Goal <70.  Zetia added by cardiology  Stage III chronic kidney disease  Baseline creatinine may be in the 1.3-1.6 range.  Creatinine has slowly crept up to 1.7 which may be a combination of cardiorenal and diuresis.  Follow BMP closely while on aggressive IV diuresis.  Morbid obesity/Body mass index is 39.8 kg/m.    DVT prophylaxis: Lovenox Code Status: Full Family Communication: None at bedside Disposition: DC home pending clinical improvement, may be couple more days.   Consultants:  Cardiology  Procedures:  None  Antimicrobials:  None   Subjective: Body swelling slowly improving.  No dyspnea or chest pain.  States that carvedilol makes her dizzy even though her blood pressure is normal.  Advised RN at bedside to check orthostatics if she complains of dizziness.  ROS: As above, otherwise negative.  Objective:  Vitals:   04/21/19 2000 04/21/19 2109 04/22/19 0630 04/22/19 0858  BP:  127/78 122/76 129/72  Pulse:  69 66 70  Resp: (!) 22     Temp:  97.8 F (36.6 C) (!) 97.4 F (36.3 C)   TempSrc:  Oral  Oral   SpO2:  100% 95%   Weight:   108.5 kg   Height:        Examination:  General exam: Pleasant middle-aged female, moderately built and morbidly obese sitting up comfortably in bed without distress. Respiratory system: Clear to auscultation.  No increased work of breathing.  Stable. Cardiovascular system: S1 & S2 heard, RRR. No JVD, murmurs, rubs, gallops or clicks.  3+ pitting leg edema extending to anterior abdominal wall and pitting edema even up to mid back.  Also has 1+ pitting edema of bilateral upper extremities-improving.  Peripheries edema slightly better than on admission.  Telemetry personally reviewed: Sinus rhythm.   Gastrointestinal system: Abdomen is nondistended, soft and nontender. No organomegaly or masses felt. Normal bowel sounds heard.  Stable. Central nervous system: Alert and oriented. No focal neurological deficits. Extremities: Symmetric 5 x 5 power. Skin: No rashes, lesions or ulcers Psychiatry: Judgement and insight appear normal. Mood & affect appropriate.     Data Reviewed: I have personally reviewed following labs and imaging studies  CBC: Recent Labs  Lab 04/20/19 0045 04/21/19 0441  WBC 6.6 5.9  NEUTROABS 4.3 4.0  HGB 15.5* 14.0  HCT 51.7* 45.1  MCV 90.1 87.6  PLT 267 025   Basic Metabolic Panel: Recent Labs  Lab 04/20/19 0045 04/21/19 0441 04/22/19 0343  NA 142 142 143  K 4.6 4.1 3.9  CL 105 106 107  CO2 24 23 25   GLUCOSE 95 102* 91  BUN 19 21 22   CREATININE 1.59* 1.64* 1.73*  CALCIUM 9.8 9.4 9.2  MG  --  1.9  --    Liver Function Tests: Recent Labs  Lab 04/20/19 0045 04/21/19 0441  AST 24 19  ALT 12 13  ALKPHOS 176* 145*  BILITOT 3.0* 3.1*  PROT 7.4 6.6  ALBUMIN 3.6 3.3*   Cardiac Enzymes: Recent Labs  Lab 04/20/19 1004 04/20/19 1209 04/20/19 1817  TROPONINI <0.03 <0.03 <0.03     Recent Results (from the past 240 hour(s))  SARS Coronavirus 2 (CEPHEID - Performed in [place] hospital lab), Hosp Order      Status: None   Collection Time: 04/20/19  1:12 AM  Result Value Ref Range Status   SARS Coronavirus 2 NEGATIVE NEGATIVE Final    Comment: (NOTE) If result is NEGATIVE SARS-CoV-2 target nucleic acids are NOT DETECTED. The SARS-CoV-2 RNA is generally detectable in upper and lower  respiratory specimens during the acute phase of infection. The lowest  concentration of SARS-CoV-2 viral copies this assay can detect is 250  copies / mL. A negative result does not preclude SARS-CoV-2 infection  and should not be used as the sole basis for treatment or other  patient management decisions.  A negative result may occur with  improper specimen collection / handling, submission of specimen other  than nasopharyngeal swab, presence of viral mutation(s) within the  areas targeted by this assay, and inadequate number of viral copies  (<250 copies / mL). A negative result must be combined with clinical  observations, patient history, and epidemiological information. If result is POSITIVE SARS-CoV-2 target nucleic acids are DETECTED. The SARS-CoV-2 RNA is generally detectable in upper  and lower  respiratory specimens dur ing the acute phase of infection.  Positive  results are indicative of active infection with SARS-CoV-2.  Clinical  correlation with patient history and other diagnostic information is  necessary to determine patient infection status.  Positive results do  not rule out bacterial infection or co-infection with other viruses. If result is PRESUMPTIVE POSTIVE SARS-CoV-2 nucleic acids MAY BE PRESENT.   A presumptive positive result was obtained on the submitted specimen  and confirmed on repeat testing.  While 2019 novel coronavirus  (SARS-CoV-2) nucleic acids may be present in the submitted sample  additional confirmatory testing may be necessary for epidemiological  and / or clinical management purposes  to differentiate between  SARS-CoV-2 and other Sarbecovirus currently known to  infect humans.  If clinically indicated additional testing with an alternate test  methodology [phone] is advised. The SARS-CoV-2 RNA is generally  detectable in upper and lower respiratory sp ecimens during the acute  phase of infection. The expected result is Negative. Fact Sheet for Patients:  BoilerBrush.com.cyhttps://www.fda.gov/media/136312/download Fact Sheet for Healthcare Providers: https://pope.com/https://www.fda.gov/media/136313/download This test is not yet approved or cleared by the [place] FDA and has been authorized for detection and/or diagnosis of SARS-CoV-2 by FDA under an Emergency Use Authorization (EUA).  This EUA will remain in effect (meaning this test can be used) for the duration of the COVID-19 declaration under Section 564(b)(1) of the Act, 21 U.S.C. section 360bbb-3(b)(1), unless the authorization is terminated or revoked sooner. Performed at [facility], [address] [place], [place] [address]          Radiology Studies: Ir Picc Placement Right >5 Yrs Inc Img Guide  Result Date: 04/22/2019 INDICATION: 68 year old female with congestive heart failure in need of durable venous access for at home infusion. EXAM: PICC LINE PLACEMENT WITH ULTRASOUND AND FLUOROSCOPIC GUIDANCE MEDICATIONS: None. ANESTHESIA/SEDATION: None. FLUOROSCOPY TIME:  Fluoroscopy Time: 0 minutes 36 seconds (3 mGy). COMPLICATIONS: None immediate. PROCEDURE: The patient was advised of the possible risks and complications and agreed to undergo the procedure. The patient was then brought to the angiographic suite for the procedure. The right arm was prepped with chlorhexidine, draped in the usual sterile fashion using maximum barrier technique (cap and mask, sterile gown, sterile gloves, large sterile sheet, hand hygiene and cutaneous antisepsis) and infiltrated locally with 1% Lidocaine. Ultrasound demonstrated patency of the right basilic vein, and this was documented with an image. Under real-time ultrasound  guidance, this vein was accessed with a 21 gauge micropuncture needle and image documentation was performed. A 0.018 wire was introduced in to the vein. Over this, a 6 [place] dual lumen power-injectable PICC was advanced to the lower SVC/right atrial junction. Fluoroscopy during the procedure and fluoro spot radiograph confirms appropriate catheter position. The catheter was flushed and covered with a sterile dressing. Catheter length: 40 cm IMPRESSION: Successful right arm Power PICC line placement with ultrasound and fluoroscopic guidance. The catheter is ready for use. Electronically Signed   By: Malachy MoanHeath  McCullough M.D.   On: 04/22/2019 13:52        Scheduled Meds:  atorvastatin  80 mg Oral q1800   carvedilol  3.125 mg Oral BID WC   enoxaparin (LOVENOX) injection  40 mg Subcutaneous Daily   ezetimibe  10 mg Oral Daily   furosemide  80 mg Intravenous BID   hydrALAZINE  10 mg Oral Q8H   isosorbide mononitrate  30 mg Oral Daily   lidocaine  magnesium oxide  400 mg Oral Q14 Days   potassium chloride SA  20 mEq Oral Daily   ticagrelor  60 mg Oral BID   Continuous Infusions:   LOS: 2 days     Marcellus Scott, MD, FACP, [facility]. [facility]  To contact the attending provider between 7A-7P or the covering provider during after hours 7P-7A, please log into the web site [email or web address] and access using universal [place] password for that web site. If you do not have the password, please call the hospital operator.  04/22/2019, 3:30 PM

## 2019-04-23 ENCOUNTER — Inpatient Hospital Stay (HOSPITAL_COMMUNITY): Payer: Medicare Other

## 2019-04-23 LAB — BASIC METABOLIC PANEL
Anion gap: 9 (ref 5–15)
BUN: 21 mg/dL (ref 8–23)
CO2: 27 mmol/L (ref 22–32)
Calcium: 8.9 mg/dL (ref 8.9–10.3)
Chloride: 104 mmol/L (ref 98–111)
Creatinine, Ser: 1.6 mg/dL — ABNORMAL HIGH (ref 0.44–1.00)
GFR calc Af Amer: 38 mL/min — ABNORMAL LOW (ref >60)
GFR calc non Af Amer: 33 mL/min — ABNORMAL LOW (ref >60)
Glucose, Bld: 134 mg/dL — ABNORMAL HIGH (ref 70–99)
Potassium: 3.7 mmol/L (ref 3.5–5.1)
Sodium: 140 mmol/L (ref 135–145)

## 2019-04-23 LAB — COOXEMETRY PANEL
Carboxyhemoglobin: 1.6 % — ABNORMAL HIGH (ref 0.5–1.5)
Methemoglobin: 1.4 % (ref 0.0–1.5)
O2 Saturation: 55.2 %
Total hemoglobin: 13.8 g/dL (ref 12.0–16.0)

## 2019-04-23 MED ORDER — SPIRONOLACTONE 12.5 MG HALF TABLET
12.5000 mg | ORAL_TABLET | Freq: Every day | ORAL | Status: DC
  Administered 2019-04-23: 13:00:00 12.5 mg via ORAL
  Filled 2019-04-23 (×2): qty 1

## 2019-04-23 MED ORDER — FUROSEMIDE 10 MG/ML IJ SOLN
15.0000 mg/h | INTRAVENOUS | Status: DC
  Administered 2019-04-23 – 2019-04-24 (×2): 15 mg/h via INTRAVENOUS
  Filled 2019-04-23 (×8): qty 25

## 2019-04-23 MED ORDER — ISOSORB DINITRATE-HYDRALAZINE 20-37.5 MG PO TABS
0.5000 | ORAL_TABLET | Freq: Three times a day (TID) | ORAL | Status: DC
  Administered 2019-04-23 – 2019-04-26 (×12): 0.5 via ORAL
  Filled 2019-04-23 (×12): qty 1

## 2019-04-23 MED ORDER — HYDROCODONE-ACETAMINOPHEN 5-325 MG PO TABS
1.0000 | ORAL_TABLET | Freq: Four times a day (QID) | ORAL | Status: DC | PRN
  Administered 2019-04-23: 1 via ORAL
  Administered 2019-04-24 – 2019-04-26 (×2): 2 via ORAL
  Administered 2019-04-26 – 2019-04-28 (×4): 1 via ORAL
  Filled 2019-04-23 (×2): qty 1
  Filled 2019-04-23: qty 2
  Filled 2019-04-23: qty 1
  Filled 2019-04-23 (×2): qty 2
  Filled 2019-04-23: qty 1

## 2019-04-23 MED ORDER — POTASSIUM CHLORIDE CRYS ER 20 MEQ PO TBCR
40.0000 meq | EXTENDED_RELEASE_TABLET | Freq: Once | ORAL | Status: AC
  Administered 2019-04-23: 17:00:00 40 meq via ORAL
  Filled 2019-04-23: qty 2

## 2019-04-23 MED ORDER — METOLAZONE 5 MG PO TABS
2.5000 mg | ORAL_TABLET | Freq: Once | ORAL | Status: AC
  Administered 2019-04-23: 2.5 mg via ORAL
  Filled 2019-04-23: qty 1

## 2019-04-23 NOTE — Progress Notes (Signed)
Orthopedic Tech Progress Note Patient Details:  Deborah Ho 05/18/1951 [id number]  Ortho Devices Type of Ortho Device: Haematologist Ortho Device/Splint Location: bilateral Ortho Device/Splint Interventions: Adjustment, Application, Ordered   Post Interventions Patient Tolerated: Well Instructions Provided: Care of device, Adjustment of device   [name] 04/23/2019, 1:57 PM

## 2019-04-23 NOTE — Progress Notes (Signed)
Spoke with Lisa, RN with IV team concerning pt's PICC dressing and site. IV team stated dressing change due 48 hours after placement (6/8) and PICC placement and site were okay. Will continue to monitor. Charge nurse Jessica, RN aware.  

## 2019-04-23 NOTE — Progress Notes (Signed)
Pharmacist Heart Failure Core Measure Documentation  Assessment: Deborah Ho has an EF documented as 15% on 11/28/18 by ECHO.  Rationale: Heart failure patients with left ventricular systolic dysfunction (LVSD) and an EF < 40% should be prescribed an angiotensin converting enzyme inhibitor (ACEI) or angiotensin receptor blocker (ARB) at discharge unless a contraindication is documented in the medical record.  This patient is not currently on an ACEI or ARB for HF.  This note is being placed in the record in order to provide documentation that a contraindication to the use of these agents is present for this encounter.  ACE Inhibitor or Angiotensin Receptor Blocker is contraindicated (specify all that apply)  []   ACEI allergy AND ARB allergy []   Angioedema []   Moderate or severe aortic stenosis []   Hyperkalemia []   Hypotension []   Renal artery stenosis [x]   Worsening renal function, preexisting renal disease or dysfunction   Jackson Latino, PharmD PGY1 Pharmacy Resident Phone [phone] 04/23/2019     2:20 PM

## 2019-04-23 NOTE — Plan of Care (Signed)

## 2019-04-23 NOTE — Progress Notes (Signed)
CVP monitoring set up with charge nurse Janett Billow, RN. 1200 CVP: 30. Will begin Lasix gtt as soon as pt returns from ultrasound.

## 2019-04-23 NOTE — Progress Notes (Signed)
Patient ID: Deborah Ho, female   DOB: 08-13-1951, 68 y.o.   MRN: [id number]     Advanced Heart Failure Rounding Note  PCP-Cardiologist: Peter Sw[place], MD   Subjective:    No dypsnea at rest.  She remains markedly swollen.  No chest pain.    Objective:   Weight Range: 108.5 kg Body mass index is 39.8 kg/m.   Vital Signs:   Temp:  [97.4 F (36.3 C)] 97.4 F (36.3 C) (06/06 2117) Pulse Rate:  [71-72] 72 (06/07 0917) Resp:  [16-22] 16 (06/07 0916) BP: (108-123)/(71-81) 121/81 (06/07 0916) SpO2:  [99 %] 99 % (06/06 2117) Last BM Date: 04/20/19  Weight change: Filed Weights   04/20/19 0421 04/21/19 0526 04/22/19 0630  Weight: 111.6 kg 110.3 kg 108.5 kg    Intake/Output:   Intake/Output Summary (Last 24 hours) at 04/23/2019 1058 Last data filed at 04/23/2019 0900 Gross per 24 hour  Intake 720 ml  Output 1300 ml  Net -580 ml      Physical Exam    General:  Well appearing. No resp difficulty HEENT: Normal Neck: Supple. JVP 16+. Carotids 2+ bilat; no bruits. No lymphadenopathy or thyromegaly appreciated. Cor: PMI lateral. Regular rate & rhythm. No rubs, gallops or murmurs. Lungs: Decreased BS at bases.  Abdomen: Soft, nontender, moderately distended. No hepatosplenomegaly. No bruits or masses. Good bowel sounds. Extremities: No cyanosis, clubbing, rash. 2+ edema to thighs.  Neuro: Alert & orientedx3, cranial nerves grossly intact. moves all 4 extremities w/o difficulty. Affect pleasant   Telemetry   NSR 80s (personally reviewed)  Labs    CBC Recent Labs    04/21/19 0441  WBC 5.9  NEUTROABS 4.0  HGB 14.0  HCT 45.1  MCV 87.6  PLT 217   Basic Metabolic Panel Recent Labs    40/98/11 0441 04/22/19 0343  NA 142 143  K 4.1 3.9  CL 106 107  CO2 23 25  GLUCOSE 102* 91  BUN 21 22  CREATININE 1.64* 1.73*  CALCIUM 9.4 9.2  MG 1.9  --    Liver Function Tests Recent Labs    04/21/19 0441  AST 19  ALT 13  ALKPHOS 145*  BILITOT 3.1*  PROT 6.6   ALBUMIN 3.3*   No results for input(s): LIPASE, AMYLASE in the last 72 hours. Cardiac Enzymes Recent Labs    04/20/19 1209 04/20/19 1817  TROPONINI <0.03 <0.03    BNP: BNP (last 3 results) Recent Labs    12/15/18 2022 01/13/19 1603 04/20/19 0045  BNP 1,850.9* 1,888.6* 3,289.0*    ProBNP (last 3 results) No results for input(s): PROBNP in the last 8760 hours.   D-Dimer No results for input(s): DDIMER in the last 72 hours. Hemoglobin A1C No results for input(s): HGBA1C in the last 72 hours. Fasting Lipid Panel Recent Labs    04/21/19 0441  CHOL 133  HDL 29*  LDLCALC 83  TRIG 914  CHOLHDL 4.6   Thyroid Function Tests Recent Labs    04/21/19 0441  TSH 1.992    Other results:   Imaging    Ir Picc Placement Right >5 Yrs Inc Img Guide  Result Date: 04/22/2019 INDICATION: 68 year old female with congestive heart failure in need of durable venous access for at home infusion. EXAM: PICC LINE PLACEMENT WITH ULTRASOUND AND FLUOROSCOPIC GUIDANCE MEDICATIONS: None. ANESTHESIA/SEDATION: None. FLUOROSCOPY TIME:  Fluoroscopy Time: 0 minutes 36 seconds (3 mGy). COMPLICATIONS: None immediate. PROCEDURE: The patient was advised of the possible risks and complications and agreed to undergo  the procedure. The patient was then brought to the angiographic suite for the procedure. The right arm was prepped with chlorhexidine, draped in the usual sterile fashion using maximum barrier technique (cap and mask, sterile gown, sterile gloves, large sterile sheet, hand hygiene and cutaneous antisepsis) and infiltrated locally with 1% Lidocaine. Ultrasound demonstrated patency of the right basilic vein, and this was documented with an image. Under real-time ultrasound guidance, this vein was accessed with a 21 gauge micropuncture needle and image documentation was performed. A 0.018 wire was introduced in to the vein. Over this, a 6 [place] dual lumen power-injectable PICC was advanced to the  lower SVC/right atrial junction. Fluoroscopy during the procedure and fluoro spot radiograph confirms appropriate catheter position. The catheter was flushed and covered with a sterile dressing. Catheter length: 40 cm IMPRESSION: Successful right arm Power PICC line placement with ultrasound and fluoroscopic guidance. The catheter is ready for use. Electronically Signed   By: Jacqulynn Cadet M.D.   On: 04/22/2019 13:52      Medications:     Scheduled Medications: . atorvastatin  80 mg Oral q1800  . carvedilol  3.125 mg Oral BID WC  . enoxaparin (LOVENOX) injection  40 mg Subcutaneous Daily  . ezetimibe  10 mg Oral Daily  . isosorbide-hydrALAZINE  0.5 tablet Oral TID  . magnesium oxide  400 mg Oral Q14 Days  . metolazone  2.5 mg Oral Once  . potassium chloride SA  20 mEq Oral Daily  . potassium chloride  40 mEq Oral Once  . spironolactone  12.5 mg Oral Daily  . ticagrelor  60 mg Oral BID     Infusions: . furosemide (LASIX) infusion       PRN Medications:  acetaminophen **OR** acetaminophen, benzonatate, lidocaine, loratadine, nitroGLYCERIN, ondansetron **OR** ondansetron (ZOFRAN) IV    Assessment/Plan   1. Acute/Chronic systolic CHF: Ischemic cardiomyopathy.  ECHO 1/20 with EF 15%, moderate MR and TR. Has refused ICD.  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 She was admitted again with marked volume overload. Says she has been taking all her medications at home. Poor diuresis yesterday with IV Lasix boluses.  Co-ox 66%.  No labs today.  - Stop lasix boluses, start Lasix gtt at 15 mg/hr and will give a dose of metolazone 2.5.  - Replace K.  - Add spironolactone 12.5 daily.   - Transition to Bidil 1/2 tab tid.  Will hold off on ARB/ARNI/ACEI for now with elevated creatinine.  - Place Unna boots 2. CAD: s/p anterior MI, PCI x2 LAD 04/2014. History of stent thrombosis probably due to noncompliance. She is allergic to aspirin. She has not had chest pain.   Presentation seems to be progression CHF rather than ACS.  - Continue Brilinta60 mg twice a day andatorvastatin 80 mg daily  3. CKD stage III:  Suspect cardiorenal syndrome, creatinine 1.7 today (baseline 1.5-1.6).  Hopefully will improve with diuresis and lowering of renal venous pressure.  4. Abdominal distention: Abdominal US to look for ascites, may benefit from paracentesis.    Length of Stay: 3  Loralie Champagne, MD  04/23/2019, 10:58 AM  Advanced Heart Failure Team Pager [phone] (M-F; 7a - 4p)  Please contact [facility] for night-coverage after hours (4p -7a ) and weekends on [email or web address]

## 2019-04-23 NOTE — Progress Notes (Signed)
Assessed PICC at bedside in handoff with Alleen Borne, [place]. Dressing clean, dry, intact. No complications noted.

## 2019-04-23 NOTE — Progress Notes (Signed)
PROGRESS NOTE   Deborah Ho  ZJI:[id number]    DOB: 1951-02-09    DOA: 04/19/2019  PCP: [facility]   I have briefly reviewed patients previous medical records in [facility].  Brief Narrative:  68 year old female, lives alone, independent, PMH of CAD, anterior STEMI with early in-stent thrombosis due to missed Brilinta s/p DES x2 to LAD 04/2014, chronic combined CHF, ICM (TTE 11/28/2018: LVEF 15% with diffuse hypokinesis and grade 3 diastolic dysfunction), GERD, HTN, HLD, medical noncompliance, presented to [facility] ED on 04/19/2019 due to progressive generalized body swelling and 20 pound weight gain of approximately 2-week duration, progressive dyspnea of 1 week duration after stopping her home Bumex which she states has not been working for her.  Admitted for decompensated CHF/anasarca.  Cardiology consulted.  IV Diuresis ongoing.   Assessment & Plan:   Principal Problem:   Acute on chronic systolic CHF (congestive heart failure) (HCC) Active Problems:   Essential hypertension   CKD (chronic kidney disease), stage III (HCC)   Acute CHF (congestive heart failure) (HCC)   Acute on chronic combined systolic and diastolic CHF/ICM  TTE 11/28/2018: LVEF 15% with diffuse hypokinesis and grade 3 diastolic dysfunction  Reports that she quit taking Bumex approximately a week PTA because it was not working well for her.  Chronic issues with noncompliance and fired from CHF clinic.  Started on IV Lasix 80 mg every 12 hourly.  Strict intake output, daily weights and fluid restriction.  -5.1 L and weight down by about 7 pounds since admission.  Not on ACEI/ARB due to chronic kidney disease.  Patient still is severely volume overloaded and may take several days to stabilize.    Cardiology was consulted, poor response to twice daily IV Lasix, PICC line placed 6/6, HF Cardiology consulted 6/7 and started IV Lasix drip along with a dose of metolazone 2.5 mg.  Continue  IV Lasix gtt at 15 mg/h, metolazone 2.5 mg x 1 dose, added spironolactone 12.5 mg daily, changed hydralazine to BiDil 1/2 tab 3 times daily.  Unna boots placed to mobilize fluid from lower extremity.  Anasarca  Secondary to decompensated CHF and chronic kidney disease.  Although she has proteinuria, not sure if she has nephrotic range proteinuria.  Unable to complete 24-hour urine protein in January 2020.  Diuretic management as noted above.  Volume status slowly improving.  Cardiology checking abdominal ultrasound to look for ascites and may benefit from paracentesis.  CAD status post PCI  No chest pain reported.  Continue Brilinta (allergic to aspirin), atorvastatin, Imdur changed to BiDil and carvedilol.  Added Zetia for additional LDL reduction.  Essential hypertension  Controlled  Continue carvedilol 3.125 mg twice daily, BiDil and spironolactone as noted above.  Also on IV Lasix infusion  Added PRN IV hydralazine.  Hyperlipidemia  Atorvastatin increased to 80 mg daily. LDL 83 this admission.  Goal <70.  Zetia added by cardiology  Stage III chronic kidney disease  Baseline creatinine may be in the 1.3-1.6 range.  Creatinine back down from 1.7-1.6, suspecting cardiorenal syndrome.  Follow BMP closely while on aggressive IV diuresis.  Morbid obesity/Body mass index is 39.8 kg/m.    DVT prophylaxis: Lovenox Code Status: Full Family Communication: None at bedside Disposition: DC home pending clinical improvement, may be several more days.   Consultants:  Cardiology  Procedures:  RUE PICC line 6/6  Antimicrobials:  None   Subjective: Ongoing extremity edema, abdominal distention without pain, dyspnea on exertion.  Reports  that she continues to urinate well.  States that she is restricting oral fluid intake.  ROS: As above, otherwise negative.  Objective:  Vitals:   04/22/19 2117 04/23/19 0916 04/23/19 0917 04/23/19 1145  BP: 123/71 121/81   92/61  Pulse: 71  72   Resp: (!) 22 16  18   Temp: (!) 97.4 F (36.3 C)     TempSrc: Oral     SpO2: 99%     Weight:      Height:        Examination:  General exam: Pleasant middle-aged female, moderately built and morbidly obese ambulating comfortably in the room without distress. Respiratory system: Occasional basal crackles but otherwise clear to auscultation.  No increased work of breathing. Cardiovascular system: S1 & S2 heard, RRR. No JVD, murmurs, rubs, gallops or clicks.  3+ pitting leg edema extending to anterior abdominal wall and pitting edema even up to mid back.  Also has 1+ pitting edema of bilateral upper extremities-improving.  Peripheries edema slightly better than on admission but remains severely volume overloaded and chronic skin changes in the lower extremity.  Telemetry personally reviewed: Sinus rhythm/low voltage.   Gastrointestinal system: Abdomen is protuberant, soft and nontender. No organomegaly or masses felt. Normal bowel sounds heard.  Unable to clinically appreciate ascites. Central nervous system: Alert and oriented. No focal neurological deficits. Extremities: Symmetric 5 x 5 power. Skin: No rashes, lesions or ulcers Psychiatry: Judgement and insight appear normal. Mood & affect appropriate.     Data Reviewed: I have personally reviewed following labs and imaging studies  CBC: Recent Labs  Lab 04/20/19 0045 04/21/19 0441  WBC 6.6 5.9  NEUTROABS 4.3 4.0  HGB 15.5* 14.0  HCT 51.7* 45.1  MCV 90.1 87.6  PLT 267 217   Basic Metabolic Panel: Recent Labs  Lab 04/20/19 0045 04/21/19 0441 04/22/19 0343 04/23/19 1105  NA 142 142 143 140  K 4.6 4.1 3.9 3.7  CL 105 106 107 104  CO2 24 23 25 27   GLUCOSE 95 102* 91 134*  BUN 19 21 22 21   CREATININE 1.59* 1.64* 1.73* 1.60*  CALCIUM 9.8 9.4 9.2 8.9  MG  --  1.9  --   --    Liver Function Tests: Recent Labs  Lab 04/20/19 0045 04/21/19 0441  AST 24 19  ALT 12 13  ALKPHOS 176* 145*  BILITOT  3.0* 3.1*  PROT 7.4 6.6  ALBUMIN 3.6 3.3*   Cardiac Enzymes: Recent Labs  Lab 04/20/19 1004 04/20/19 1209 04/20/19 1817  TROPONINI <0.03 <0.03 <0.03     Recent Results (from the past 240 hour(s))  SARS Coronavirus 2 (CEPHEID - Performed in [facility] hospital lab), Hosp Order     Status: None   Collection Time: 04/20/19  1:12 AM  Result Value Ref Range Status   SARS Coronavirus 2 NEGATIVE NEGATIVE Final    Comment: (NOTE) If result is NEGATIVE SARS-CoV-2 target nucleic acids are NOT DETECTED. The SARS-CoV-2 RNA is generally detectable in upper and lower  respiratory specimens during the acute phase of infection. The lowest  concentration of SARS-CoV-2 viral copies this assay can detect is 250  copies / mL. A negative result does not preclude SARS-CoV-2 infection  and should not be used as the sole basis for treatment or other  patient management decisions.  A negative result may occur with  improper specimen collection / handling, submission of specimen other  than nasopharyngeal swab, presence of viral mutation(s) within the  areas targeted by  this assay, and inadequate number of viral copies  (<250 copies / mL). A negative result must be combined with clinical  observations, patient history, and epidemiological information. If result is POSITIVE SARS-CoV-2 target nucleic acids are DETECTED. The SARS-CoV-2 RNA is generally detectable in upper and lower  respiratory specimens dur ing the acute phase of infection.  Positive  results are indicative of active infection with SARS-CoV-2.  Clinical  correlation with patient history and other diagnostic information is  necessary to determine patient infection status.  Positive results do  not rule out bacterial infection or co-infection with other viruses. If result is PRESUMPTIVE POSTIVE SARS-CoV-2 nucleic acids MAY BE PRESENT.   A presumptive positive result was obtained on the submitted specimen  and confirmed on repeat  testing.  While 2019 novel coronavirus  (SARS-CoV-2) nucleic acids may be present in the submitted sample  additional confirmatory testing may be necessary for epidemiological  and / or clinical management purposes  to differentiate between  SARS-CoV-2 and other Sarbecovirus currently known to infect humans.  If clinically indicated additional testing with an alternate test  methodology [phone]) is advised. The SARS-CoV-2 RNA is generally  detectable in upper and lower respiratory sp ecimens during the acute  phase of infection. The expected result is Negative. Fact Sheet for Patients:  BoilerBrush.com.cy Fact Sheet for Healthcare Providers: https://pope.com/ This test is not yet approved or cleared by the [place] FDA and has been authorized for detection and/or diagnosis of SARS-CoV-2 by FDA under an Emergency Use Authorization (EUA).  This EUA will remain in effect (meaning this test can be used) for the duration of the COVID-19 declaration under Section 564(b)(1) of the Act, 21 U.S.C. section 360bbb-3(b)(1), unless the authorization is terminated or revoked sooner. Performed at [facility], [address], [place], [place] [address]          Radiology Studies: Ir Picc Placement Right >5 Yrs Inc Img Guide  Result Date: 04/22/2019 INDICATION: 68 year old female with congestive heart failure in need of durable venous access for at home infusion. EXAM: PICC LINE PLACEMENT WITH ULTRASOUND AND FLUOROSCOPIC GUIDANCE MEDICATIONS: None. ANESTHESIA/SEDATION: None. FLUOROSCOPY TIME:  Fluoroscopy Time: 0 minutes 36 seconds (3 mGy). COMPLICATIONS: None immediate. PROCEDURE: The patient was advised of the possible risks and complications and agreed to undergo the procedure. The patient was then brought to the angiographic suite for the procedure. The right arm was prepped with chlorhexidine, draped in the usual sterile fashion using  maximum barrier technique (cap and mask, sterile gown, sterile gloves, large sterile sheet, hand hygiene and cutaneous antisepsis) and infiltrated locally with 1% Lidocaine. Ultrasound demonstrated patency of the right basilic vein, and this was documented with an image. Under real-time ultrasound guidance, this vein was accessed with a 21 gauge micropuncture needle and image documentation was performed. A 0.018 wire was introduced in to the vein. Over this, a 6 [place] dual lumen power-injectable PICC was advanced to the lower SVC/right atrial junction. Fluoroscopy during the procedure and fluoro spot radiograph confirms appropriate catheter position. The catheter was flushed and covered with a sterile dressing. Catheter length: 40 cm IMPRESSION: Successful right arm Power PICC line placement with ultrasound and fluoroscopic guidance. The catheter is ready for use. Electronically Signed   By: Malachy Moan M.D.   On: 04/22/2019 13:52        Scheduled Meds:  atorvastatin  80 mg Oral q1800   carvedilol  3.125 mg Oral BID WC   enoxaparin (LOVENOX) injection  40 mg Subcutaneous Daily   ezetimibe  10 mg Oral Daily   isosorbide-hydrALAZINE  0.5 tablet Oral TID   magnesium oxide  400 mg Oral Q14 Days   potassium chloride SA  20 mEq Oral Daily   potassium chloride  40 mEq Oral Once   spironolactone  12.5 mg Oral Daily   ticagrelor  60 mg Oral BID   Continuous Infusions:  furosemide (LASIX) infusion 15 mg/hr (04/23/19 1252)     LOS: 3 days     Vernell Leep, MD, FACP, [facility]. [facility]  To contact the attending provider between 7A-7P or the covering provider during after hours 7P-7A, please log into the web site [email or web address] and access using universal [place] password for that web site. If you do not have the password, please call the hospital operator.  04/23/2019, 2:15 PM

## 2019-04-24 LAB — BASIC METABOLIC PANEL
Anion gap: 10 (ref 5–15)
BUN: 23 mg/dL (ref 8–23)
CO2: 30 mmol/L (ref 22–32)
Calcium: 9.3 mg/dL (ref 8.9–10.3)
Chloride: 100 mmol/L (ref 98–111)
Creatinine, Ser: 1.65 mg/dL — ABNORMAL HIGH (ref 0.44–1.00)
GFR calc Af Amer: 37 mL/min — ABNORMAL LOW (ref >60)
GFR calc non Af Amer: 32 mL/min — ABNORMAL LOW (ref >60)
Glucose, Bld: 104 mg/dL — ABNORMAL HIGH (ref 70–99)
Potassium: 4 mmol/L (ref 3.5–5.1)
Sodium: 140 mmol/L (ref 135–145)

## 2019-04-24 LAB — COOXEMETRY PANEL
Carboxyhemoglobin: 1.9 % — ABNORMAL HIGH (ref 0.5–1.5)
Methemoglobin: 0.7 % (ref 0.0–1.5)
O2 Saturation: 69.2 %
Total hemoglobin: 13.9 g/dL (ref 12.0–16.0)

## 2019-04-24 MED ORDER — METOLAZONE 5 MG PO TABS
2.5000 mg | ORAL_TABLET | Freq: Once | ORAL | Status: AC
  Administered 2019-04-24: 2.5 mg via ORAL
  Filled 2019-04-24: qty 1

## 2019-04-24 MED ORDER — POTASSIUM CHLORIDE CRYS ER 20 MEQ PO TBCR
40.0000 meq | EXTENDED_RELEASE_TABLET | Freq: Once | ORAL | Status: AC
  Administered 2019-04-24: 10:00:00 40 meq via ORAL
  Filled 2019-04-24: qty 2

## 2019-04-24 MED ORDER — SPIRONOLACTONE 25 MG PO TABS
25.0000 mg | ORAL_TABLET | Freq: Every day | ORAL | Status: DC
  Administered 2019-04-24 – 2019-04-28 (×5): 25 mg via ORAL
  Filled 2019-04-24 (×5): qty 1

## 2019-04-24 MED ORDER — BENZONATATE 100 MG PO CAPS
100.0000 mg | ORAL_CAPSULE | Freq: Every day | ORAL | Status: DC
  Administered 2019-04-24 – 2019-04-27 (×4): 100 mg via ORAL
  Filled 2019-04-24 (×4): qty 1

## 2019-04-24 NOTE — Care Management Important Message (Signed)
Important Message  Patient Details  Name: Deborah Ho MRN: [id number] Date of Birth: 04/04/1951   Medicare Important Message Given:  Yes    [name] 04/24/2019, 12:17 PM

## 2019-04-24 NOTE — Plan of Care (Signed)
  Problem: Clinical Measurements: Goal: Diagnostic test results will improve Outcome: Progressing   Problem: Activity: Goal: Risk for activity intolerance will decrease Outcome: Progressing   Problem: Nutrition: Goal: Adequate nutrition will be maintained Outcome: Progressing   Problem: Coping: Goal: Level of anxiety will decrease Outcome: Progressing   Problem: Elimination: Goal: Will not experience complications related to bowel motility Outcome: Progressing Goal: Will not experience complications related to urinary retention Outcome: Progressing   Problem: Pain Managment: Goal: General experience of comfort will improve Outcome: Progressing   Problem: Safety: Goal: Ability to remain free from injury will improve Outcome: Progressing   Problem: Skin Integrity: Goal: Risk for impaired skin integrity will decrease Outcome: Progressing   

## 2019-04-24 NOTE — Progress Notes (Addendum)
PROGRESS NOTE   Deborah Ho  WFU:[id number]    DOB: Mar 29, 1951    DOA: 04/19/2019  PCP: [facility] have briefly reviewed patients previous medical records in [facility].  Brief Narrative:  68 year old female, lives alone, independent, PMH of CAD, anterior STEMI with early in-stent thrombosis due to missed Brilinta s/p DES x2 to LAD 04/2014, chronic combined CHF, ICM (TTE 11/28/2018: LVEF 15% with diffuse hypokinesis and grade 3 diastolic dysfunction), GERD, HTN, HLD, medical noncompliance, presented to [facility] ED on 04/19/2019 due to progressive generalized body swelling and 20 pound weight gain of approximately 2-week duration, progressive dyspnea of 1 week duration after stopping her home Bumex which she states has not been working for her.  Admitted for decompensated CHF/anasarca.  Cardiology consulted.  IV Diuresis ongoing with IV Lasix infusion since 6/7.   Assessment & Plan:   Principal Problem:   Acute on chronic systolic CHF (congestive heart failure) (HCC) Active Problems:   Essential hypertension   CKD (chronic kidney disease), stage III (HCC)   Acute CHF (congestive heart failure) (HCC)   Acute on chronic combined systolic and diastolic CHF/ICM  TTE 12/18/5425: LVEF 15% with diffuse hypokinesis and grade 3 diastolic dysfunction  Reports that she quit taking Bumex approximately a week PTA because it was not working well for her.  Chronic issues with noncompliance and fired from CHF clinic.  Strict intake output, daily weights and fluid restriction.   Not on ACEI/ARB due to chronic kidney disease.  Cardiology was consulted, poor response to IV Lasix 80 mg every 12 hours, PICC line placed, HF Cardiology consulted and started IV Lasix drip on 6/7 along with a dose of metolazone 2.5 mg.  Continue IV Lasix gtt at 15 mg/h, s/p metolazone 2.5 mg on 5/7 & 5/8, Aldactone increased to 25 mg daily, continue hydralazine to BiDil 1/2 tab 3 times daily.   Unna boots placed to mobilize fluid from lower extremity.  Much better response.  UO: 6200 mL yesterday.  -10.6 L since admission.  Weight down by 17 pounds since admission.  Anasarca  Secondary to decompensated CHF and chronic kidney disease.  Although she has proteinuria, not sure if she has nephrotic range proteinuria.  Unable to complete 24-hour urine protein in January 2020.  Diuretic management as above and finally starting to mobilize fluids.  Abdominal ultrasound: Small to moderate volume ascites, monitor with IV diuresis.    CAD status post PCI  No chest pain reported.  Continue Brilinta (allergic to aspirin), atorvastatin, Imdur changed to BiDil and carvedilol.  Added Zetia for additional LDL reduction.  Essential hypertension  Controlled  Continue carvedilol 3.125 mg twice daily, BiDil and spironolactone as noted above.  Also on IV Lasix infusion  Added PRN IV hydralazine.  Hyperlipidemia  Atorvastatin increased to 80 mg daily. LDL 83 this admission.  Goal <70.  Zetia added by cardiology  Stage III chronic kidney disease  Baseline creatinine may be in the 1.3-1.6 range.  Creatinine stable in the 1.6-1.7 range.  May have an element of cardiorenal syndrome.  Follow BMP closely while on aggressive IV diuresis.  Morbid obesity/Body mass index is 38.11 kg/m.    DVT prophylaxis: Lovenox Code Status: Full Family Communication: None at bedside Disposition: DC home pending clinical improvement, may be several more days.   Consultants:  Cardiology  Procedures:  RUE PICC line 6/6  Antimicrobials:  None   Subjective: States that she urinated well all day yesterday.  Leg swelling and abdominal distention better.  No dyspnea or chest pain..  ROS: As above, otherwise negative.  Objective:  Vitals:   04/23/19 2115 04/24/19 0520 04/24/19 0523 04/24/19 1348  BP: 117/69 114/73  127/89  Pulse: 71 70  77  Resp: (!) 23 20    Temp: (!) 97.4 F (36.3  C) (!) 97.5 F (36.4 C)  98 F (36.7 C)  TempSrc: Oral Oral  Oral  SpO2: 100% 100%  98%  Weight:   103.9 kg   Height:        Examination:  General exam: Pleasant middle-aged female, moderately built and morbidly obese sitting up comfortably in bed. Respiratory system: Clear to auscultation.  No increased work of breathing. Cardiovascular system: S1 & S2 heard, RRR. No JVD, murmurs, rubs, gallops or clicks.  3+ pitting bilateral leg edema and 1+ pitting bilateral upper extremity edema, starting to improve.  Has bilateral lower extremity Unna boots.  Telemetry personally reviewed: Sinus rhythm, low voltage. Gastrointestinal system: Abdomen is protuberant, less so today, soft and nontender. No organomegaly or masses felt. Normal bowel sounds heard.  Unable to clinically appreciate ascites. Central nervous system: Alert and oriented. No focal neurological deficits. Extremities: Symmetric 5 x 5 power. Skin: No rashes, lesions or ulcers Psychiatry: Judgement and insight appear normal. Mood & affect appropriate.     Data Reviewed: I have personally reviewed following labs and imaging studies  CBC: Recent Labs  Lab 04/20/19 0045 04/21/19 0441  WBC 6.6 5.9  NEUTROABS 4.3 4.0  HGB 15.5* 14.0  HCT 51.7* 45.1  MCV 90.1 87.6  PLT 267 217   Basic Metabolic Panel: Recent Labs  Lab 04/20/19 0045 04/21/19 0441 04/22/19 0343 04/23/19 1105 04/24/19 0500  NA 142 142 143 140 140  K 4.6 4.1 3.9 3.7 4.0  CL 105 106 107 104 100  CO2 24 23 25 27 30   GLUCOSE 95 102* 91 134* 104*  BUN 19 21 22 21 23   CREATININE 1.59* 1.64* 1.73* 1.60* 1.65*  CALCIUM 9.8 9.4 9.2 8.9 9.3  MG  --  1.9  --   --   --    Liver Function Tests: Recent Labs  Lab 04/20/19 0045 04/21/19 0441  AST 24 19  ALT 12 13  ALKPHOS 176* 145*  BILITOT 3.0* 3.1*  PROT 7.4 6.6  ALBUMIN 3.6 3.3*   Cardiac Enzymes: Recent Labs  Lab 04/20/19 1004 04/20/19 1209 04/20/19 1817  TROPONINI <0.03 <0.03 <0.03      Recent Results (from the past 240 hour(s))  SARS Coronavirus 2 (CEPHEID - Performed in [facility] hospital lab), Hosp Order     Status: None   Collection Time: 04/20/19  1:12 AM  Result Value Ref Range Status   SARS Coronavirus 2 NEGATIVE NEGATIVE Final    Comment: (NOTE) If result is NEGATIVE SARS-CoV-2 target nucleic acids are NOT DETECTED. The SARS-CoV-2 RNA is generally detectable in upper and lower  respiratory specimens during the acute phase of infection. The lowest  concentration of SARS-CoV-2 viral copies this assay can detect is 250  copies / mL. A negative result does not preclude SARS-CoV-2 infection  and should not be used as the sole basis for treatment or other  patient management decisions.  A negative result may occur with  improper specimen collection / handling, submission of specimen other  than nasopharyngeal swab, presence of viral mutation(s) within the  areas targeted by this assay, and inadequate number of viral copies  (<250 copies / mL).  A negative result must be combined with clinical  observations, patient history, and epidemiological information. If result is POSITIVE SARS-CoV-2 target nucleic acids are DETECTED. The SARS-CoV-2 RNA is generally detectable in upper and lower  respiratory specimens dur ing the acute phase of infection.  Positive  results are indicative of active infection with SARS-CoV-2.  Clinical  correlation with patient history and other diagnostic information is  necessary to determine patient infection status.  Positive results do  not rule out bacterial infection or co-infection with other viruses. If result is PRESUMPTIVE POSTIVE SARS-CoV-2 nucleic acids MAY BE PRESENT.   A presumptive positive result was obtained on the submitted specimen  and confirmed on repeat testing.  While 2019 novel coronavirus  (SARS-CoV-2) nucleic acids may be present in the submitted sample  additional confirmatory testing may be necessary for  epidemiological  and / or clinical management purposes  to differentiate between  SARS-CoV-2 and other Sarbecovirus currently known to infect humans.  If clinically indicated additional testing with an alternate test  methodology [phone]) is advised. The SARS-CoV-2 RNA is generally  detectable in upper and lower respiratory sp ecimens during the acute  phase of infection. The expected result is Negative. Fact Sheet for Patients:  BoilerBrush.com.cy Fact Sheet for Healthcare Providers: https://pope.com/ This test is not yet approved or cleared by the [place] FDA and has been authorized for detection and/or diagnosis of SARS-CoV-2 by FDA under an Emergency Use Authorization (EUA).  This EUA will remain in effect (meaning this test can be used) for the duration of the COVID-19 declaration under Section 564(b)(1) of the Act, 21 U.S.C. section 360bbb-3(b)(1), unless the authorization is terminated or revoked sooner. Performed at [facility], [address] [place], [place] [address]          Radiology Studies: US Abdomen Limited  Result Date: 04/23/2019 CLINICAL DATA:  Abdominal distension.  Evaluate for ascites. EXAM: LIMITED ABDOMEN ULTRASOUND FOR ASCITES TECHNIQUE: Limited ultrasound survey for ascites was performed in all four abdominal quadrants. COMPARISON:  01/13/2019 FINDINGS: Small to moderate amount of ascites identified within all 4 quadrants. IMPRESSION: Small to moderate volume ascites. Electronically Signed   By: Jeronimo Greaves M.D.   On: 04/23/2019 15:54        Scheduled Meds: . atorvastatin  80 mg Oral q1800  . carvedilol  3.125 mg Oral BID WC  . enoxaparin (LOVENOX) injection  40 mg Subcutaneous Daily  . ezetimibe  10 mg Oral Daily  . isosorbide-hydrALAZINE  0.5 tablet Oral TID  . magnesium oxide  400 mg Oral Q14 Days  . potassium chloride SA  20 mEq Oral Daily  . spironolactone  25 mg Oral Daily  .  ticagrelor  60 mg Oral BID   Continuous Infusions: . furosemide (LASIX) infusion 15 mg/hr (04/24/19 0512)     LOS: 4 days     Marcellus Scott, MD, FACP, [facility]. [facility]  To contact the attending provider between 7A-7P or the covering provider during after hours 7P-7A, please log into the web site [email or web address] and access using universal [place] password for that web site. If you do not have the password, please call the hospital operator.  04/24/2019, 1:58 PM

## 2019-04-24 NOTE — Progress Notes (Signed)
Patient ID: Deborah Ho, female   DOB: [birth year], 68 y.o.   MRN: [id number]     Advanced Heart Failure Rounding Note  PCP-Cardiologist: Peter Swaziland, MD   Subjective:    Good diuresis yesterday, weight down 10 lbs.  Creatinine stable 1.65. CVP > 20, co-ox 69%.   Abdominal US: Small to moderate ascites   Objective:   Weight Range: 103.9 kg Body mass index is 38.11 kg/m.   Vital Signs:   Temp:  [97.4 F (36.3 C)-97.5 F (36.4 C)] 97.5 F (36.4 C) (06/08 0520) Pulse Rate:  [70-82] 70 (06/08 0520) Resp:  [16-24] 20 (06/08 0520) BP: (92-121)/(61-81) 114/73 (06/08 0520) SpO2:  [98 %-100 %] 100 % (06/08 0520) Weight:  [103.9 kg] 103.9 kg (06/08 0523) Last BM Date: 04/23/19  Weight change: Filed Weights   04/21/19 0526 04/22/19 0630 04/24/19 0523  Weight: 110.3 kg 108.5 kg 103.9 kg    Intake/Output:   Intake/Output Summary (Last 24 hours) at 04/24/2019 0731 Last data filed at 04/24/2019 8938 Gross per 24 hour  Intake 1200.48 ml  Output 6200 ml  Net -4999.52 ml      Physical Exam    General: NAD Neck: JVP 16+, no thyromegaly or thyroid nodule.  Lungs: Clear to auscultation bilaterally with normal respiratory effort. CV: Nonpalpable PMI.  Heart regular S1/S2, no S3/S4, no murmur.  2+ edema to knees bilaterally.  Abdomen: Soft, nontender, no hepatosplenomegaly, moderate distention.  Skin: Intact without lesions or rashes.  Neurologic: Alert and oriented x 3.  Psych: Normal affect. Extremities: No clubbing or cyanosis.  HEENT: Normal.    Telemetry   NSR 80s (personally reviewed)  Labs    CBC No results for input(s): WBC, NEUTROABS, HGB, HCT, MCV, PLT in the last 72 hours. Basic Metabolic Panel Recent Labs    09/01/50 1105 04/24/19 0500  NA 140 140  K 3.7 4.0  CL 104 100  CO2 27 30  GLUCOSE 134* 104*  BUN 21 23  CREATININE 1.60* 1.65*  CALCIUM 8.9 9.3   Liver Function Tests No results for input(s): AST, ALT, ALKPHOS, BILITOT, PROT, ALBUMIN in the  last 72 hours. No results for input(s): LIPASE, AMYLASE in the last 72 hours. Cardiac Enzymes No results for input(s): CKTOTAL, CKMB, CKMBINDEX, TROPONINI in the last 72 hours.  BNP: BNP (last 3 results) Recent Labs    12/15/18 2022 01/13/19 1603 04/20/19 0045  BNP 1,850.9* 1,888.6* 3,289.0*    ProBNP (last 3 results) No results for input(s): PROBNP in the last 8760 hours.   D-Dimer No results for input(s): DDIMER in the last 72 hours. Hemoglobin A1C No results for input(s): HGBA1C in the last 72 hours. Fasting Lipid Panel No results for input(s): CHOL, HDL, LDLCALC, TRIG, CHOLHDL, LDLDIRECT in the last 72 hours. Thyroid Function Tests No results for input(s): TSH, T4TOTAL, T3FREE, THYROIDAB in the last 72 hours.  Invalid input(s): FREET3  Other results:   Imaging    US Abdomen Limited  Result Date: 04/23/2019 CLINICAL DATA:  Abdominal distension.  Evaluate for ascites. EXAM: LIMITED ABDOMEN ULTRASOUND FOR ASCITES TECHNIQUE: Limited ultrasound survey for ascites was performed in all four abdominal quadrants. COMPARISON:  01/13/2019 FINDINGS: Small to moderate amount of ascites identified within all 4 quadrants. IMPRESSION: Small to moderate volume ascites. Electronically Signed   By: Jeronimo Greaves M.D.   On: 04/23/2019 15:54     Medications:     Scheduled Medications: . atorvastatin  80 mg Oral q1800  . carvedilol  3.125 mg Oral  BID WC  . enoxaparin (LOVENOX) injection  40 mg Subcutaneous Daily  . ezetimibe  10 mg Oral Daily  . isosorbide-hydrALAZINE  0.5 tablet Oral TID  . magnesium oxide  400 mg Oral Q14 Days  . metolazone  2.5 mg Oral Once  . potassium chloride SA  20 mEq Oral Daily  . potassium chloride  40 mEq Oral Once  . spironolactone  25 mg Oral Daily  . ticagrelor  60 mg Oral BID    Infusions: . furosemide (LASIX) infusion 15 mg/hr (04/24/19 0512)    PRN Medications: acetaminophen **OR** acetaminophen, benzonatate, HYDROcodone-acetaminophen,  loratadine, nitroGLYCERIN, ondansetron **OR** ondansetron (ZOFRAN) IV    Assessment/Plan   1. Acute/Chronic systolic CHF: Ischemic cardiomyopathy.  ECHO 1/20 with EF 15%, moderate MR and TR. Has refused ICD.  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 She was admitted again with marked volume overload. Says she has been taking all her medications at home. Better diuresis with Lasix gtt and metolazone yesterday, weight down 10 lbs.  CVP still > 20 with co-ox 69%.  - Continue Lasix gtt at 15 mg/hr and will give a dose of metolazone 2.5 again.  - Replace K.  - Increase spironolactone to 25 mg daily.   - Continue Coreg 3.125 mg bid.  - Continue Bidil 1/2 tab tid.  Will hold off on ARB/ARNI/ACEI for now with elevated creatinine.  - Placed Unna boots 2. CAD: s/p anterior MI, PCI x2 LAD 04/2014. History of stent thrombosis probably due to noncompliance. She is allergic to aspirin. She has not had chest pain.  Presentation seems to be progression CHF rather than ACS.  - Continue Brilinta60 mg twice a day andatorvastatin 80 mg daily  3. CKD stage III:  Suspect cardiorenal syndrome, creatinine 1.65 today (baseline 1.5-1.6).  Hopefully will improve with diuresis and lowering of renal venous pressure.  4. Abdominal distention: Mild to moderate ascites on [place], probably not enough to warrant paracentesis.    Length of Stay: 4  Loralie Champagne, MD  04/24/2019, 7:31 AM  Advanced Heart Failure Team Pager [phone] (M-F; 7a - 4p)  Please contact [facility] for night-coverage after hours (4p -7a ) and weekends on [email or web address]

## 2019-04-25 LAB — COOXEMETRY PANEL
Carboxyhemoglobin: 1.7 % — ABNORMAL HIGH (ref 0.5–1.5)
Carboxyhemoglobin: 2 % — ABNORMAL HIGH (ref 0.5–1.5)
Methemoglobin: 0.7 % (ref 0.0–1.5)
Methemoglobin: 0.7 % (ref 0.0–1.5)
O2 Saturation: 54.5 %
O2 Saturation: 62.3 %
Total hemoglobin: 14.4 g/dL (ref 12.0–16.0)
Total hemoglobin: 14.3 g/dL (ref 12.0–16.0)

## 2019-04-25 LAB — BASIC METABOLIC PANEL
Anion gap: 11 (ref 5–15)
BUN: 25 mg/dL — ABNORMAL HIGH (ref 8–23)
CO2: 32 mmol/L (ref 22–32)
Calcium: 9.8 mg/dL (ref 8.9–10.3)
Chloride: 99 mmol/L (ref 98–111)
Creatinine, Ser: 1.84 mg/dL — ABNORMAL HIGH (ref 0.44–1.00)
GFR calc Af Amer: 32 mL/min — ABNORMAL LOW (ref >60)
GFR calc non Af Amer: 28 mL/min — ABNORMAL LOW (ref >60)
Glucose, Bld: 114 mg/dL — ABNORMAL HIGH (ref 70–99)
Potassium: 4.1 mmol/L (ref 3.5–5.1)
Sodium: 142 mmol/L (ref 135–145)

## 2019-04-25 MED ORDER — POTASSIUM CHLORIDE CRYS ER 20 MEQ PO TBCR
40.0000 meq | EXTENDED_RELEASE_TABLET | Freq: Once | ORAL | Status: AC
  Administered 2019-04-25: 11:00:00 40 meq via ORAL
  Filled 2019-04-25: qty 2

## 2019-04-25 MED ORDER — METOLAZONE 5 MG PO TABS
2.5000 mg | ORAL_TABLET | Freq: Once | ORAL | Status: AC
  Administered 2019-04-25: 11:00:00 2.5 mg via ORAL
  Filled 2019-04-25: qty 1

## 2019-04-25 NOTE — Progress Notes (Signed)
PROGRESS NOTE   Deborah Ho  GYI:[id number]    DOB: 1951-04-30    DOA: 04/19/2019  PCP: [facility]   I have briefly reviewed patients previous medical records in [facility].  Brief Narrative:  68 year old female, lives alone, independent, PMH of CAD, anterior STEMI with early in-stent thrombosis due to missed Brilinta s/p DES x2 to LAD 04/2014, chronic combined CHF, ICM (TTE 11/28/2018: LVEF 15% with diffuse hypokinesis and grade 3 diastolic dysfunction), GERD, HTN, HLD, medical noncompliance, presented to [facility] ED on 04/19/2019 due to progressive generalized body swelling and 20 pound weight gain of approximately 2-week duration, progressive dyspnea of 1 week duration after stopping her home Bumex which she states has not been working for her.  Admitted for decompensated CHF/anasarca.  Cardiology consulted.  IV Diuresis ongoing with IV Lasix infusion since 6/7.  Slowly improving.  Care transferred over to Heart Failure service (discussed with Dr. Shirlee Latch) and [facility] signed off on 6/9.   Assessment & Plan:   Principal Problem:   Acute on chronic systolic CHF (congestive heart failure) (HCC) Active Problems:   Essential hypertension   CKD (chronic kidney disease), stage III (HCC)   Acute CHF (congestive heart failure) (HCC)   Acute on chronic combined systolic and diastolic CHF/ICM  TTE 11/28/2018: LVEF 15% with diffuse hypokinesis and grade 3 diastolic dysfunction  Reports that she quit taking Bumex approximately a week PTA because it was not working well for her.  Chronic issues with noncompliance and fired from CHF clinic.  Strict intake output, daily weights and fluid restriction.   Not on ACEI/ARB due to chronic kidney disease.  Cardiology was consulted, poor response to IV Lasix 80 mg every 12 hours, PICC line placed, HF Cardiology consulted and started IV Lasix drip on 6/7 along with a dose of metolazone 2.5 mg.  Continue IV Lasix gtt at 15 mg/h, s/p  metolazone 2.5 mg on 5/7 & 5/8, Aldactone increased to 25 mg daily, continue hydralazine to BiDil 1/2 tab 3 times daily.  Unna boots placed to mobilize fluid from lower extremity.  Much better response.  UO: 6200 mL yesterday.  - 15.5 L since admission.  Weight down by 30 pounds since admission.  Improving.  Anasarca  Secondary to decompensated CHF and chronic kidney disease.  Although she has proteinuria, not sure if she has nephrotic range proteinuria.  Unable to complete 24-hour urine protein in January 2020.  Diuretic management as above and finally starting to mobilize fluids.  Abdominal ultrasound: Small to moderate volume ascites, monitor with IV diuresis.    Improving.  CAD status post PCI  No chest pain reported.  Continue Brilinta (allergic to aspirin), atorvastatin, Imdur changed to BiDil and carvedilol.  Added Zetia for additional LDL reduction.  Essential hypertension  Controlled  Continue carvedilol 3.125 mg twice daily, BiDil and spironolactone as noted above.  Also on IV Lasix infusion  Added PRN IV hydralazine.  Hyperlipidemia  Atorvastatin increased to 80 mg daily. LDL 83 this admission.  Goal <70.  Zetia added by cardiology  Stage III chronic kidney disease  Baseline creatinine may be in the 1.3-1.6 range.  Creatinine stable in the 1.6-1.7 range.  May have an element of cardiorenal syndrome.  Creatinine has slightly increased to 1.8. Follow BMP closely while on aggressive IV diuresis.  Morbid obesity/Body mass index is 36.03 kg/m.    DVT prophylaxis: Lovenox Code Status: Full Family Communication: None at bedside Disposition: DC home pending clinical improvement, may  be several more days.  I discussed with Dr. Shirlee Latch on 6/9 who kindly accepted patient's care onto his service.  [facility] signed off.   Consultants:  Cardiology  Procedures:  RUE PICC line 6/6  Antimicrobials:  None   Subjective: Continues to urinate well.  Body  swelling improving.  Wants to know when Unna boot can come off.  ROS: As above, otherwise negative.  Objective:  Vitals:   04/25/19 0011 04/25/19 0617 04/25/19 0626 04/25/19 0635  BP: 113/90  126/79   Pulse: 76  73   Resp:      Temp:    (!) 97.4 F (36.3 C)  TempSrc:    Oral  SpO2: 96%     Weight:  98.2 kg    Height:        Examination:  General exam: Pleasant middle-aged female, moderately built and morbidly obese seen ambulating steadily around her bed Respiratory system: Clear to auscultation.  No increased work of breathing. Cardiovascular system: S1 & S2 heard, RRR. No JVD, murmurs, rubs, gallops or clicks.  Extremity edema finally starting to improve.  Creases seen on her legs now.  Telemetry personally reviewed: Sinus rhythm. Gastrointestinal system: Abdomen is protuberant, less so today, soft and nontender. No organomegaly or masses felt. Normal bowel sounds heard.  Unable to clinically appreciate ascites. Central nervous system: Alert and oriented. No focal neurological deficits. Extremities: Symmetric 5 x 5 power. Skin: No rashes, lesions or ulcers Psychiatry: Judgement and insight appear normal. Mood & affect appropriate.     Data Reviewed: I have personally reviewed following labs and imaging studies  CBC: Recent Labs  Lab 04/20/19 0045 04/21/19 0441  WBC 6.6 5.9  NEUTROABS 4.3 4.0  HGB 15.5* 14.0  HCT 51.7* 45.1  MCV 90.1 87.6  PLT 267 217   Basic Metabolic Panel: Recent Labs  Lab 04/21/19 0441 04/22/19 0343 04/23/19 1105 04/24/19 0500 04/25/19 0508  NA 142 143 140 140 142  K 4.1 3.9 3.7 4.0 4.1  CL 106 107 104 100 99  CO2 23 25 27 30  32  GLUCOSE 102* 91 134* 104* 114*  BUN 21 22 21 23  25*  CREATININE 1.64* 1.73* 1.60* 1.65* 1.84*  CALCIUM 9.4 9.2 8.9 9.3 9.8  MG 1.9  --   --   --   --    Liver Function Tests: Recent Labs  Lab 04/20/19 0045 04/21/19 0441  AST 24 19  ALT 12 13  ALKPHOS 176* 145*  BILITOT 3.0* 3.1*  PROT 7.4 6.6   ALBUMIN 3.6 3.3*   Cardiac Enzymes: Recent Labs  Lab 04/20/19 1004 04/20/19 1209 04/20/19 1817  TROPONINI <0.03 <0.03 <0.03     Recent Results (from the past 240 hour(s))  SARS Coronavirus 2 (CEPHEID - Performed in [facility] hospital lab), Hosp Order     Status: None   Collection Time: 04/20/19  1:12 AM  Result Value Ref Range Status   SARS Coronavirus 2 NEGATIVE NEGATIVE Final    Comment: (NOTE) If result is NEGATIVE SARS-CoV-2 target nucleic acids are NOT DETECTED. The SARS-CoV-2 RNA is generally detectable in upper and lower  respiratory specimens during the acute phase of infection. The lowest  concentration of SARS-CoV-2 viral copies this assay can detect is 250  copies / mL. A negative result does not preclude SARS-CoV-2 infection  and should not be used as the sole basis for treatment or other  patient management decisions.  A negative result may occur with  improper specimen collection / handling,  submission of specimen other  than nasopharyngeal swab, presence of viral mutation(s) within the  areas targeted by this assay, and inadequate number of viral copies  (<250 copies / mL). A negative result must be combined with clinical  observations, patient history, and epidemiological information. If result is POSITIVE SARS-CoV-2 target nucleic acids are DETECTED. The SARS-CoV-2 RNA is generally detectable in upper and lower  respiratory specimens dur ing the acute phase of infection.  Positive  results are indicative of active infection with SARS-CoV-2.  Clinical  correlation with patient history and other diagnostic information is  necessary to determine patient infection status.  Positive results do  not rule out bacterial infection or co-infection with other viruses. If result is PRESUMPTIVE POSTIVE SARS-CoV-2 nucleic acids MAY BE PRESENT.   A presumptive positive result was obtained on the submitted specimen  and confirmed on repeat testing.  While 2019 novel  coronavirus  (SARS-CoV-2) nucleic acids may be present in the submitted sample  additional confirmatory testing may be necessary for epidemiological  and / or clinical management purposes  to differentiate between  SARS-CoV-2 and other Sarbecovirus currently known to infect humans.  If clinically indicated additional testing with an alternate test  methodology [phone]) is advised. The SARS-CoV-2 RNA is generally  detectable in upper and lower respiratory sp ecimens during the acute  phase of infection. The expected result is Negative. Fact Sheet for Patients:  StrictlyIdeas.no Fact Sheet for Healthcare Providers: BankingDealers.co.za This test is not yet approved or cleared by the [place] FDA and has been authorized for detection and/or diagnosis of SARS-CoV-2 by FDA under an Emergency Use Authorization (EUA).  This EUA will remain in effect (meaning this test can be used) for the duration of the COVID-19 declaration under Section 564(b)(1) of the Act, 21 U.S.C. section 360bbb-3(b)(1), unless the authorization is terminated or revoked sooner. Performed at [facility], [place] [address], [place], [place] [address]          Radiology Studies: No results found.      Scheduled Meds: . atorvastatin  80 mg Oral q1800  . benzonatate  100 mg Oral QHS  . carvedilol  3.125 mg Oral BID WC  . enoxaparin (LOVENOX) injection  40 mg Subcutaneous Daily  . ezetimibe  10 mg Oral Daily  . isosorbide-hydrALAZINE  0.5 tablet Oral TID  . magnesium oxide  400 mg Oral Q14 Days  . potassium chloride SA  20 mEq Oral Daily  . spironolactone  25 mg Oral Daily  . ticagrelor  60 mg Oral BID   Continuous Infusions: . furosemide (LASIX) infusion 15 mg/hr (04/24/19 2313)     LOS: 5 days     Vernell Leep, MD, FACP, [facility]. [facility]  To contact the attending provider between 7A-7P or the covering provider during after hours  7P-7A, please log into the web site [email or web address] and access using universal [place] password for that web site. If you do not have the password, please call the hospital operator.  04/25/2019, 7:03 PM

## 2019-04-25 NOTE — Progress Notes (Signed)
Patient ID: Deborah Ho, female   DOB: [birth year], 68 y.o.   MRN: [id number]     Advanced Heart Failure Rounding Note  PCP-Cardiologist: Peter Martinique, MD   Subjective:    Good diuresis yesterday, weight down 13 lbs.  Creatinine mildly higher 1.65 => 1.8. CVP 18-19, co-ox 55%.   Abdominal US: Small to moderate ascites   Objective:   Weight Range: 98.2 kg Body mass index is 36.03 kg/m.   Vital Signs:   Temp:  [97.4 F (36.3 C)-98 F (36.7 C)] 97.4 F (36.3 C) (06/09 0635) Pulse Rate:  [73-77] 73 (06/09 0626) BP: (113-127)/(65-90) 126/79 (06/09 0626) SpO2:  [96 %-99 %] 96 % (06/09 0011) Weight:  [98.2 kg] 98.2 kg (06/09 0617) Last BM Date: 04/24/19  Weight change: Filed Weights   04/22/19 0630 04/24/19 0523 04/25/19 0617  Weight: 108.5 kg 103.9 kg 98.2 kg    Intake/Output:   Intake/Output Summary (Last 24 hours) at 04/25/2019 0731 Last data filed at 04/25/2019 0500 Gross per 24 hour  Intake 970.92 ml  Output 7600 ml  Net -6629.08 ml      Physical Exam    General: NAD Neck: JVP 16+, no thyromegaly or thyroid nodule.  Lungs: Clear to auscultation bilaterally with normal respiratory effort. CV: Lateral PMI.  Heart regular S1/S2, no S3/S4, no murmur.  1+ edema to thighs bilaterally.   Abdomen: Soft, nontender, no hepatosplenomegaly, mild distention.  Skin: Intact without lesions or rashes.  Neurologic: Alert and oriented x 3.  Psych: Normal affect. Extremities: No clubbing or cyanosis.  HEENT: Normal.    Telemetry   NSR 80s (personally reviewed)  Labs    CBC No results for input(s): WBC, NEUTROABS, HGB, HCT, MCV, PLT in the last 72 hours. Basic Metabolic Panel Recent Labs    04/24/19 0500 04/25/19 0508  NA 140 142  K 4.0 4.1  CL 100 99  CO2 30 32  GLUCOSE 104* 114*  BUN 23 25*  CREATININE 1.65* 1.84*  CALCIUM 9.3 9.8   Liver Function Tests No results for input(s): AST, ALT, ALKPHOS, BILITOT, PROT, ALBUMIN in the last 72 hours. No results for  input(s): LIPASE, AMYLASE in the last 72 hours. Cardiac Enzymes No results for input(s): CKTOTAL, CKMB, CKMBINDEX, TROPONINI in the last 72 hours.  BNP: BNP (last 3 results) Recent Labs    12/15/18 2022 01/13/19 1603 04/20/19 0045  BNP 1,850.9* 1,888.6* 3,289.0*    ProBNP (last 3 results) No results for input(s): PROBNP in the last 8760 hours.   D-Dimer No results for input(s): DDIMER in the last 72 hours. Hemoglobin A1C No results for input(s): HGBA1C in the last 72 hours. Fasting Lipid Panel No results for input(s): CHOL, HDL, LDLCALC, TRIG, CHOLHDL, LDLDIRECT in the last 72 hours. Thyroid Function Tests No results for input(s): TSH, T4TOTAL, T3FREE, THYROIDAB in the last 72 hours.  Invalid input(s): FREET3  Other results:   Imaging    No results found.   Medications:     Scheduled Medications: . atorvastatin  80 mg Oral q1800  . benzonatate  100 mg Oral QHS  . carvedilol  3.125 mg Oral BID WC  . enoxaparin (LOVENOX) injection  40 mg Subcutaneous Daily  . ezetimibe  10 mg Oral Daily  . isosorbide-hydrALAZINE  0.5 tablet Oral TID  . magnesium oxide  400 mg Oral Q14 Days  . metolazone  2.5 mg Oral Once  . potassium chloride SA  20 mEq Oral Daily  . potassium chloride  40 mEq Oral  Once  . spironolactone  25 mg Oral Daily  . ticagrelor  60 mg Oral BID    Infusions: . furosemide (LASIX) infusion 15 mg/hr (04/24/19 2313)    PRN Medications: acetaminophen **OR** acetaminophen, benzonatate, HYDROcodone-acetaminophen, loratadine, nitroGLYCERIN, ondansetron **OR** ondansetron (ZOFRAN) IV    Assessment/Plan   1. Acute/Chronic systolic CHF: Ischemic cardiomyopathy.  ECHO 1/20 with EF 15%, moderate MR and TR. Has refused ICD.  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 She was admitted again with marked volume overload. Says she has been taking all her medications at home. Good diuresis with Lasix gtt and metolazone yesterday, weight down 13 lbs.   CVP still 18-19 with co-ox 55%.  - Continue Lasix gtt at 15 mg/hr and will give a dose of metolazone 2.5 again.  - Replace K.  - Continue spironolactone 25 mg daily.   - Continue Coreg 3.125 mg bid.  - Continue Bidil 1/2 tab tid.  Will hold off on ARB/ARNI/ACEI for now with elevated creatinine.  - Will repeat co-ox, if lower than 55% or so may need milrinone while diuresing.  - Placed Unna boots 2. CAD: s/p anterior MI, PCI x2 LAD 04/2014. History of stent thrombosis probably due to noncompliance. She is allergic to aspirin. She has not had chest pain.  Presentation seems to be progression CHF rather than ACS.  - Continue Brilinta60 mg twice a day andatorvastatin 80 mg daily  3. CKD stage III:  Suspect cardiorenal syndrome, creatinine mildly higher at 1.8 today (baseline 1.5-1.6).  Hopefully will improve with diuresis and lowering of renal venous pressure.  4. Abdominal distention: Mild to moderate ascites on [place], probably not enough to warrant paracentesis.    Length of Stay: 5  [name], MD  04/25/2019, 7:31 AM  Advanced Heart Failure Team Pager [phone] (M-F; 7a - 4p)  Please contact [facility] for night-coverage after hours (4p -7a ) and weekends on [email or web address]

## 2019-04-26 LAB — BASIC METABOLIC PANEL
Anion gap: 14 (ref 5–15)
BUN: 26 mg/dL — ABNORMAL HIGH (ref 8–23)
CO2: 32 mmol/L (ref 22–32)
Calcium: 9.8 mg/dL (ref 8.9–10.3)
Chloride: 95 mmol/L — ABNORMAL LOW (ref 98–111)
Creatinine, Ser: 1.73 mg/dL — ABNORMAL HIGH (ref 0.44–1.00)
GFR calc Af Amer: 35 mL/min — ABNORMAL LOW (ref >60)
GFR calc non Af Amer: 30 mL/min — ABNORMAL LOW (ref >60)
Glucose, Bld: 97 mg/dL (ref 70–99)
Potassium: 3.6 mmol/L (ref 3.5–5.1)
Sodium: 141 mmol/L (ref 135–145)

## 2019-04-26 LAB — MAGNESIUM: Magnesium: 2 mg/dL (ref 1.7–2.4)

## 2019-04-26 LAB — COOXEMETRY PANEL
Carboxyhemoglobin: 2.1 % — ABNORMAL HIGH (ref 0.5–1.5)
Methemoglobin: 0.8 % (ref 0.0–1.5)
O2 Saturation: 68.4 %
Total hemoglobin: 15.2 g/dL (ref 12.0–16.0)

## 2019-04-26 MED ORDER — METOLAZONE 5 MG PO TABS
2.5000 mg | ORAL_TABLET | Freq: Once | ORAL | Status: AC
  Administered 2019-04-26: 10:00:00 2.5 mg via ORAL
  Filled 2019-04-26: qty 1

## 2019-04-26 MED ORDER — POTASSIUM CHLORIDE CRYS ER 20 MEQ PO TBCR
40.0000 meq | EXTENDED_RELEASE_TABLET | Freq: Once | ORAL | Status: AC
  Administered 2019-04-26: 10:00:00 40 meq via ORAL
  Filled 2019-04-26: qty 2

## 2019-04-26 NOTE — Progress Notes (Signed)
Patient ID: Deborah Ho, female   DOB: 03-21-1951, 68 y.o.   MRN: [id number]     Advanced Heart Failure Rounding Note  PCP-Cardiologist: Peter Swaziland, MD   Subjective:    Good diuresis yesterday, weight down again.  Creatinine stable at 1.7. CVP down to 14, co-ox 68%.   Abdominal US: Small to moderate ascites   Objective:   Weight Range: 90.5 kg Body mass index is 33.2 kg/m.   Vital Signs:   Temp:  [97.4 F (36.3 C)-97.8 F (36.6 C)] 97.6 F (36.4 C) (06/10 0828) Pulse Rate:  [78-84] 84 (06/10 0828) Resp:  [18-19] 18 (06/10 0828) BP: (108-122)/(46-71) 108/46 (06/10 0828) SpO2:  [99 %-100 %] 100 % (06/10 0828) Weight:  [90.5 kg] 90.5 kg (06/10 0700) Last BM Date: 04/25/19  Weight change: Filed Weights   04/24/19 0523 04/25/19 0617 04/26/19 0700  Weight: 103.9 kg 98.2 kg 90.5 kg    Intake/Output:   Intake/Output Summary (Last 24 hours) at 04/26/2019 0839 Last data filed at 04/26/2019 0700 Gross per 24 hour  Intake 1181.38 ml  Output 4700 ml  Net -3518.62 ml      Physical Exam    General: NAD Neck: JVP 12 cm, no thyromegaly or thyroid nodule.  Lungs: Clear to auscultation bilaterally with normal respiratory effort. CV: Nondisplaced PMI.  Heart regular S1/S2, no S3/S4, no murmur.  1+ edema to knee bilaterally.   Abdomen: Soft, nontender, no hepatosplenomegaly, mild distention.  Skin: Intact without lesions or rashes.  Neurologic: Alert and oriented x 3.  Psych: Normal affect. Extremities: No clubbing or cyanosis.  HEENT: Normal.    Telemetry   NSR 80s (personally reviewed)  Labs    CBC No results for input(s): WBC, NEUTROABS, HGB, HCT, MCV, PLT in the last 72 hours. Basic Metabolic Panel Recent Labs    81/01/75 0508 04/26/19 0445  NA 142 141  K 4.1 3.6  CL 99 95*  CO2 32 32  GLUCOSE 114* 97  BUN 25* 26*  CREATININE 1.84* 1.73*  CALCIUM 9.8 9.8  MG  --  2.0   Liver Function Tests No results for input(s): AST, ALT, ALKPHOS, BILITOT,  PROT, ALBUMIN in the last 72 hours. No results for input(s): LIPASE, AMYLASE in the last 72 hours. Cardiac Enzymes No results for input(s): CKTOTAL, CKMB, CKMBINDEX, TROPONINI in the last 72 hours.  BNP: BNP (last 3 results) Recent Labs    12/15/18 2022 01/13/19 1603 04/20/19 0045  BNP 1,850.9* 1,888.6* 3,289.0*    ProBNP (last 3 results) No results for input(s): PROBNP in the last 8760 hours.   D-Dimer No results for input(s): DDIMER in the last 72 hours. Hemoglobin A1C No results for input(s): HGBA1C in the last 72 hours. Fasting Lipid Panel No results for input(s): CHOL, HDL, LDLCALC, TRIG, CHOLHDL, LDLDIRECT in the last 72 hours. Thyroid Function Tests No results for input(s): TSH, T4TOTAL, T3FREE, THYROIDAB in the last 72 hours.  Invalid input(s): FREET3  Other results:   Imaging    No results found.   Medications:     Scheduled Medications: . atorvastatin  80 mg Oral q1800  . benzonatate  100 mg Oral QHS  . carvedilol  3.125 mg Oral BID WC  . enoxaparin (LOVENOX) injection  40 mg Subcutaneous Daily  . ezetimibe  10 mg Oral Daily  . isosorbide-hydrALAZINE  0.5 tablet Oral TID  . magnesium oxide  400 mg Oral Q14 Days  . potassium chloride SA  20 mEq Oral Daily  . spironolactone  25 mg Oral Daily  . ticagrelor  60 mg Oral BID    Infusions: . furosemide (LASIX) infusion 15 mg/hr (04/26/19 0500)    PRN Medications: acetaminophen **OR** acetaminophen, benzonatate, HYDROcodone-acetaminophen, loratadine, nitroGLYCERIN, ondansetron **OR** ondansetron (ZOFRAN) IV    Assessment/Plan   1. Acute/Chronic systolic CHF: Ischemic cardiomyopathy.  ECHO 1/20 with EF 15%, moderate MR and TR. Has refused ICD.  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 She was admitted again with marked volume overload. Says she has been taking all her medications at home. Good diuresis with Lasix gtt and metolazone yesterday, weight down again.  CVP down to 14 with  co-ox 68%.  - Continue Lasix gtt at 15 mg/hr and will give a dose of metolazone 2.5 again today.  Possibly ready for po tomorrow.  - Replace K.  - Continue spironolactone 25 mg daily.   - Continue Coreg 3.125 mg bid.  - Continue Bidil 1/2 tab tid.  Will hold off on ARB/ARNI/ACEI for now with elevated creatinine.  - Placed Unna boots 2. CAD: s/p anterior MI, PCI x2 LAD 04/2014. History of stent thrombosis probably due to noncompliance. She is allergic to aspirin. She has not had chest pain.  Presentation seems to be progression CHF rather than ACS.  - Continue Brilinta60 mg twice a day andatorvastatin 80 mg daily  3. CKD stage III:  Suspect cardiorenal syndrome, creatinine stable at 1.7 today (baseline 1.5-1.6).   4. Abdominal distention: Mild to moderate ascites on [place], probably not enough to warrant paracentesis.   Length of Stay: 6  Loralie Champagne, MD  04/26/2019, 8:39 AM  Advanced Heart Failure Team Pager [phone] (M-F; 7a - 4p)  Please contact [facility] for night-coverage after hours (4p -7a ) and weekends on [email or web address]

## 2019-04-26 NOTE — Progress Notes (Signed)
Orthopedic Tech Progress Note Patient Details:  Deborah Ho [birth year] [id number]  Ortho Devices Type of Ortho Device: Haematologist Ortho Device/Splint Location: bilateral Ortho Device/Splint Interventions: Adjustment, Application, Ordered   Post Interventions Patient Tolerated: Well Instructions Provided: Care of device, Adjustment of device   [name] 04/26/2019, 2:35 PM

## 2019-04-27 LAB — COOXEMETRY PANEL
Carboxyhemoglobin: 1.7 % — ABNORMAL HIGH (ref 0.5–1.5)
Methemoglobin: 0.6 % (ref 0.0–1.5)
O2 Saturation: 64.1 %
Total hemoglobin: 15.7 g/dL (ref 12.0–16.0)

## 2019-04-27 LAB — BASIC METABOLIC PANEL
Anion gap: 13 (ref 5–15)
BUN: 32 mg/dL — ABNORMAL HIGH (ref 8–23)
CO2: 33 mmol/L — ABNORMAL HIGH (ref 22–32)
Calcium: 9.7 mg/dL (ref 8.9–10.3)
Chloride: 92 mmol/L — ABNORMAL LOW (ref 98–111)
Creatinine, Ser: 1.75 mg/dL — ABNORMAL HIGH (ref 0.44–1.00)
GFR calc Af Amer: 34 mL/min — ABNORMAL LOW (ref >60)
GFR calc non Af Amer: 30 mL/min — ABNORMAL LOW (ref >60)
Glucose, Bld: 95 mg/dL (ref 70–99)
Potassium: 3.5 mmol/L (ref 3.5–5.1)
Sodium: 138 mmol/L (ref 135–145)

## 2019-04-27 MED ORDER — TORSEMIDE 20 MG PO TABS
60.0000 mg | ORAL_TABLET | Freq: Every day | ORAL | Status: DC
  Administered 2019-04-27 – 2019-04-28 (×2): 60 mg via ORAL
  Filled 2019-04-27 (×2): qty 3

## 2019-04-27 MED ORDER — ISOSORB DINITRATE-HYDRALAZINE 20-37.5 MG PO TABS
1.0000 | ORAL_TABLET | Freq: Three times a day (TID) | ORAL | Status: DC
  Administered 2019-04-27 – 2019-04-28 (×3): 1 via ORAL
  Filled 2019-04-27 (×4): qty 1

## 2019-04-27 NOTE — Progress Notes (Signed)
Patient ID: Deborah Ho, female   DOB: 05/07/1951, 68 y.o.   MRN: [id number]    Advanced Heart Failure Rounding Note  PCP-Cardiologist: Peter Swaziland, MD   Subjective:    Good diuresis again yesterday, weight down again.  Creatinine stable at 1.75. CVP down to 10, co-ox 64%.   Abdominal US: Small to moderate ascites   Objective:   Weight Range: 87.8 kg Body mass index is 32.21 kg/m.   Vital Signs:   Temp:  [97.6 F (36.4 C)-98 F (36.7 C)] 98 F (36.7 C) (06/11 0636) Pulse Rate:  [83-86] 85 (06/11 0636) Resp:  [17-18] 17 (06/11 0636) BP: (106-125)/(46-67) 120/66 (06/11 0636) SpO2:  [90 %-100 %] 90 % (06/11 0636) Weight:  [87.8 kg] 87.8 kg (06/11 0636) Last BM Date: 04/25/19  Weight change: Filed Weights   04/25/19 0617 04/26/19 0700 04/27/19 0636  Weight: 98.2 kg 90.5 kg 87.8 kg    Intake/Output:   Intake/Output Summary (Last 24 hours) at 04/27/2019 0748 Last data filed at 04/27/2019 0600 Gross per 24 hour  Intake 1206.94 ml  Output 3950 ml  Net -2743.06 ml      Physical Exam    General: NAD Neck: JVP 12 cm, no thyromegaly or thyroid nodule.  Lungs: Clear to auscultation bilaterally with normal respiratory effort. CV: Nondisplaced PMI.  Heart regular S1/S2, no S3/S4, no murmur.  1+ edema to knee bilaterally.   Abdomen: Soft, nontender, no hepatosplenomegaly, mild distention.  Skin: Intact without lesions or rashes.  Neurologic: Alert and oriented x 3.  Psych: Normal affect. Extremities: No clubbing or cyanosis.  HEENT: Normal.    Telemetry   NSR 80s (personally reviewed)  Labs    CBC No results for input(s): WBC, NEUTROABS, HGB, HCT, MCV, PLT in the last 72 hours. Basic Metabolic Panel Recent Labs    40/98/11 0445 04/27/19 0446  NA 141 138  K 3.6 3.5  CL 95* 92*  CO2 32 33*  GLUCOSE 97 95  BUN 26* 32*  CREATININE 1.73* 1.75*  CALCIUM 9.8 9.7  MG 2.0  --    Liver Function Tests No results for input(s): AST, ALT, ALKPHOS, BILITOT,  PROT, ALBUMIN in the last 72 hours. No results for input(s): LIPASE, AMYLASE in the last 72 hours. Cardiac Enzymes No results for input(s): CKTOTAL, CKMB, CKMBINDEX, TROPONINI in the last 72 hours.  BNP: BNP (last 3 results) Recent Labs    12/15/18 2022 01/13/19 1603 04/20/19 0045  BNP 1,850.9* 1,888.6* 3,289.0*    ProBNP (last 3 results) No results for input(s): PROBNP in the last 8760 hours.   D-Dimer No results for input(s): DDIMER in the last 72 hours. Hemoglobin A1C No results for input(s): HGBA1C in the last 72 hours. Fasting Lipid Panel No results for input(s): CHOL, HDL, LDLCALC, TRIG, CHOLHDL, LDLDIRECT in the last 72 hours. Thyroid Function Tests No results for input(s): TSH, T4TOTAL, T3FREE, THYROIDAB in the last 72 hours.  Invalid input(s): FREET3  Other results:   Imaging    No results found.   Medications:     Scheduled Medications: . atorvastatin  80 mg Oral q1800  . benzonatate  100 mg Oral QHS  . carvedilol  3.125 mg Oral BID WC  . enoxaparin (LOVENOX) injection  40 mg Subcutaneous Daily  . ezetimibe  10 mg Oral Daily  . isosorbide-hydrALAZINE  1 tablet Oral TID  . magnesium oxide  400 mg Oral Q14 Days  . potassium chloride SA  20 mEq Oral Daily  . spironolactone  25 mg Oral Daily  . ticagrelor  60 mg Oral BID  . torsemide  60 mg Oral Daily    Infusions:   PRN Medications: acetaminophen **OR** acetaminophen, benzonatate, HYDROcodone-acetaminophen, loratadine, nitroGLYCERIN, ondansetron **OR** ondansetron (ZOFRAN) IV    Assessment/Plan   1. Acute/Chronic systolic CHF: Ischemic cardiomyopathy.  ECHO 1/20 with EF 15%, moderate MR and TR. Has refused ICD.  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 She was admitted again with marked volume overload. Says she has been taking all her medications at home. Good diuresis again with Lasix gtt and metolazone yesterday, weight down again.  CVP down to 10 with co-ox 64%.  - Stop  Lasix gtt, start torsemide 60 mg daily.  - Replace K.  - Continue spironolactone 25 mg daily.   - Continue Coreg 3.125 mg bid.  - Increase Bidil to 1 tab tid.  Will hold off on ARB/ARNI/ACEI for now with elevated creatinine.  - Placed Unna boots 2. CAD: s/p anterior MI, PCI x2 LAD 04/2014. History of stent thrombosis probably due to noncompliance. She is allergic to aspirin. She has not had chest pain.  Presentation seems to be progression CHF rather than ACS.  - Continue Brilinta60 mg twice a day andatorvastatin 80 mg daily  3. CKD stage III:  Suspect cardiorenal syndrome, creatinine stable at 1.75 today (baseline 1.5-1.6).   4. Abdominal distention: Mild to moderate ascites on [place], probably not enough to warrant paracentesis.   Possibly home tomorrow. Will see if we can arrange for paramedicine.   Length of Stay: 7  Loralie Champagne, MD  04/27/2019, 7:48 AM  Advanced Heart Failure Team Pager [phone] (M-F; [place])  Please contact [facility] for night-coverage after hours (4p -7a ) and weekends on [email or web address]

## 2019-04-27 NOTE — TOC Progression Note (Signed)
Transition of Care [facility]) - Progression Note    Patient Details  Name: Deborah Ho MRN: [id number] Date of Birth: [birth year]  Transition of Care [facility]) CM/SW Contact  Graves-Bigelow, Ocie Cornfield, RN Phone Number: 04/27/2019, 1:52 PM  Clinical Narrative:  CM received consult for Bidil. Patient is without Rx coverage part D. Patient uses [employer]- cost will be $385.94 for 90 day supply. Patient will not be able to afford this medication. CM did reach out to [facility] the Heart Failure and asked about the Paramedicine Team. Navigator states that patient will not allow Paramedicine to come into her home. CM will continue to monitor for additional transition of care needs.      Expected Discharge Plan: Home/Self Care Barriers to Discharge: Continued Medical Work up  Expected Discharge Plan and Services Expected Discharge Plan: Home/Self Care     Social Determinants of Health (SDOH) Interventions    Readmission Risk Interventions No flowsheet data found.

## 2019-04-27 NOTE — Discharge Summary (Signed)
Advanced Heart Failure Team  Discharge Summary   Patient ID: Deborah Ho MRN: [id number], DOB/AGE: 68-Oct-1952 68 y.o. Admit date: 04/19/2019 D/C date:     04/28/2019   Primary Discharge Diagnoses:  1. Acute/Chronic systolic CHF: Ischemic cardiomyopathy.  2. CAD: s/p anterior MI, PCI x2 LAD 04/2014.  3. CKD stage III:  Suspect cardiorenal syndrome 4. Abdominal distention   Hospital Course:  Deborah Ho is a 68 y.o. female with a history of HTN, HLD, tobacco abuse, CAD s/panterior STEMI with early stent thrombosis for missing doses of Brilinta s/p PCI x 2 into LAD (04/2014),chronic systolic HF, refused ICD, and noncompliance.    Admitted with A/C systolic heart failure and chest pain. Presentation not thought to be ACS but marked volume overload.  Diuresed with lasix drip and later transitioned to torsemide 60 mg daily. Diuresed over 45 pounds. HF medications optimized. We will try to see her in the HF clinic but she rarely shows for appointments. Discharge weight 191 pounds.   1. Acute/Chronic systolic CHF: Ischemic cardiomyopathy. ECHO 1/20 with EF 15%, moderate MR and TR. Has refused ICD. 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 She was admitted again with marked volume overload. Diuresed with lasix drip and metolazone. Once adequately diuresed she was place on torsemide 60 mg daily. HF medications optimized.  - Continue spironolactone 25 mg daily.   - Continue Coreg 3.125 mg bid.  - Placed on bidil but she has no insurance coverage. Switched to hydralazine 37.5 mg tid and imdur 60 daily.  - No ARB/ARNI/ACEI for now with elevated creatinine.  2. CAD: s/p anterior MI, PCI x2 LAD 04/2014. History of stent thrombosis probably due to noncompliance. She is allergic to aspirin. She has not had chest pain.  Presentation seems to be progression CHF rather than ACS.  - Continue Brilinta60 mg twice a day andatorvastatin 80 mg daily  3. CKD stage III:  Suspect cardiorenal  syndrome. Renal function followed closely.  4. Abdominal distention: Mild to moderate ascites on [place], probably not enough to warrant paracentesis.    Discharge Vitals: Blood pressure 103/65, pulse 83, temperature 97.6 F (36.4 C), temperature source Oral, resp. rate 17, height 5\' 5"  (1.651 m), weight 87 kg, SpO2 96 %.  Labs: Lab Results  Component Value Date   WBC 5.9 04/21/2019   HGB 14.0 04/21/2019   HCT 45.1 04/21/2019   MCV 87.6 04/21/2019   PLT 217 04/21/2019    Recent Labs  Lab 04/28/19 0601  NA 136  K 3.5  CL 91*  CO2 30  BUN 39*  CREATININE 1.82*  CALCIUM 9.7  GLUCOSE 89   Lab Results  Component Value Date   CHOL 133 04/21/2019   HDL 29 (L) 04/21/2019   LDLCALC 83 04/21/2019   TRIG 106 04/21/2019   BNP (last 3 results) Recent Labs    12/15/18 2022 01/13/19 1603 04/20/19 0045  BNP 1,850.9* 1,888.6* 3,289.0*    ProBNP (last 3 results) No results for input(s): PROBNP in the last 8760 hours.   Diagnostic Studies/Procedures   No results found.  Discharge Medications   Allergies as of 04/28/2019      Reactions   Aspirin Swelling   Chewable children's aspirin makes patients tongue and face swell   Effient [prasugrel] Swelling   Patient's tongue and face swells   Entresto [sacubitril-valsartan] Other (See Comments)   dizziness   Lactose Intolerance (gi) Other (See Comments)   REACTION: stomach upset   Robitussin Dm [guaifenesin-dm] Swelling  Patient's tongue swells   Sulfa Antibiotics Swelling   Other    Had to replace "catgut" with clamps      Medication List    STOP taking these medications   bumetanide 2 MG tablet Commonly known as: Bumex     TAKE these medications   atorvastatin 40 MG tablet Commonly known as: LIPITOR Take 2 tablets (80 mg total) by mouth daily at 6 PM. What changed: how much to take   carvedilol 3.125 MG tablet Commonly known as: COREG Take 1 tablet (3.125 mg total) by mouth 2 (two) times daily with a meal.    cetirizine 10 MG tablet Commonly known as: ZYRTEC Take 10 mg daily by mouth.   cholecalciferol 1000 units tablet Commonly known as: VITAMIN D Take 1,000 Units by mouth every 14 (fourteen) days. Twice a month   fluticasone 50 MCG/ACT nasal spray Commonly known as: FLONASE Place 1 spray into both nostrils daily as needed for allergies (congestion).   hydrALAZINE 25 MG tablet Commonly known as: APRESOLINE Take 1.5 tablets (37.5 mg total) by mouth 3 (three) times daily.   IRON PO Take 1 tablet by mouth 3 (three) times a week. Take on MWF   isosorbide mononitrate 30 MG 24 hr tablet Commonly known as: IMDUR Take 2 tablets (60 mg total) by mouth daily. What changed: how much to take   magnesium oxide 400 (241.3 Mg) MG tablet Commonly known as: MAG-OX Take 400 mg by mouth every 14 (fourteen) days. Twice a month   nitroGLYCERIN 0.4 MG SL tablet Commonly known as: NITROSTAT Place 0.4 mg under the tongue every 5 (five) minutes as needed for chest pain.   potassium chloride SA 20 MEQ tablet Commonly known as: K-DUR Take 1 tablet (20 mEq total) by mouth daily.   spironolactone 25 MG tablet Commonly known as: ALDACTONE Take 1 tablet (25 mg total) by mouth daily.   ticagrelor 60 MG Tabs tablet Commonly known as: Brilinta Take 1 tablet (60 mg total) by mouth 2 (two) times daily.   torsemide 20 MG tablet Commonly known as: DEMADEX Take 3 tablets (60 mg total) by mouth daily.       Disposition   The patient will be discharged in stable condition to home. Discharge Instructions    (HEART FAILURE PATIENTS) Call MD:  Anytime you have any of the following symptoms: 1) 3 pound weight gain in 24 hours or 5 pounds in 1 week 2) shortness of breath, with or without a dry hacking cough 3) swelling in the hands, feet or stomach 4) if you have to sleep on extra pillows at night in order to breathe.   Complete by: As directed    Diet - low sodium heart healthy   Complete by: As directed     Heart Failure patients record your daily weight using the same scale at the same time of day   Complete by: As directed    Increase activity slowly   Complete by: As directed      Follow-up Information    Larey Dresser, MD Follow up on 05/12/2019.   Specialty: Cardiology Why: at 10:00 [place] information: [address] [place] [address] [phone]             Duration of Discharge Encounter: Greater than 35 minutes   Signed, Darrick Grinder NP-C  04/28/2019, 8:06 AM

## 2019-04-27 NOTE — Care Management Important Message (Signed)
Important Message  Patient Details  Name: Loy Little MRN: [id number] Date of Birth: 1951-06-14   Medicare Important Message Given:  Yes    [name] 04/27/2019, 3:22 PM

## 2019-04-28 LAB — BASIC METABOLIC PANEL WITH GFR
Anion gap: 15 (ref 5–15)
BUN: 39 mg/dL — ABNORMAL HIGH (ref 8–23)
CO2: 30 mmol/L (ref 22–32)
Calcium: 9.7 mg/dL (ref 8.9–10.3)
Chloride: 91 mmol/L — ABNORMAL LOW (ref 98–111)
Creatinine, Ser: 1.82 mg/dL — ABNORMAL HIGH (ref 0.44–1.00)
GFR calc Af Amer: 33 mL/min — ABNORMAL LOW
GFR calc non Af Amer: 28 mL/min — ABNORMAL LOW
Glucose, Bld: 89 mg/dL (ref 70–99)
Potassium: 3.5 mmol/L (ref 3.5–5.1)
Sodium: 136 mmol/L (ref 135–145)

## 2019-04-28 LAB — COOXEMETRY PANEL
Carboxyhemoglobin: 1.9 % — ABNORMAL HIGH (ref 0.5–1.5)
Methemoglobin: 0.6 % (ref 0.0–1.5)
O2 Saturation: 70.9 %
Total hemoglobin: 15.4 g/dL (ref 12.0–16.0)

## 2019-04-28 MED ORDER — TORSEMIDE 20 MG PO TABS: 60.0000 mg | ORAL_TABLET | Freq: Every day | ORAL | 6 refills | Status: DC

## 2019-04-28 MED ORDER — ATORVASTATIN CALCIUM 40 MG PO TABS: 80.0000 mg | ORAL_TABLET | Freq: Every day | ORAL | 5 refills | Status: DC

## 2019-04-28 MED ORDER — HYDRALAZINE HCL 25 MG PO TABS: 37.5000 mg | ORAL_TABLET | Freq: Three times a day (TID) | ORAL | 6 refills | Status: DC

## 2019-04-28 MED ORDER — SPIRONOLACTONE 25 MG PO TABS: 25.0000 mg | ORAL_TABLET | Freq: Every day | ORAL | 6 refills | Status: DC

## 2019-04-28 MED ORDER — ISOSORBIDE MONONITRATE ER 30 MG PO TB24: 60.0000 mg | ORAL_TABLET | Freq: Every day | ORAL | 6 refills | Status: DC

## 2019-04-28 MED ORDER — ISOSORBIDE MONONITRATE ER 30 MG PO TB24: 30.0000 mg | ORAL_TABLET | Freq: Every day | ORAL | 6 refills | Status: DC

## 2019-04-28 MED FILL — SPIRONOLACTONE 25 MG TABLET: 25 | 30 days supply | Qty: 30 | Fill #0

## 2019-04-28 MED FILL — TORSEMIDE 20 MG TABLET: 20 | 30 days supply | Qty: 90 | Fill #0

## 2019-04-28 MED FILL — ATORVASTATIN CALCIUM 80 MG: 80 | 30 days supply | Qty: 30 | Fill #0

## 2019-04-28 MED FILL — ISOSORBIDE MN ER 60 MG TAB: 60 | 30 days supply | Qty: 30 | Fill #0

## 2019-04-28 MED FILL — hydrALAZINE HCL 25 MG TABS: 25 | 30 days supply | Qty: 135 | Fill #0

## 2019-04-28 NOTE — TOC Transition Note (Signed)
Transition of Care [facility]) - CM/SW Discharge Note   Patient Details  Name: Deborah Ho MRN: [id number] Date of Birth: 1951-04-25  Transition of Care [facility]) CM/SW Contact:  Bethena Roys, RN Phone Number: 04/28/2019, 10:45 AM   Clinical Narrative: [facility] will deliver medications to patient's room. Patient states she can pay the co pay. Unable to [facility] the patient with Medicare. Plan for home-pt states she can get transportation. CM did discuss [place] Needs with patient. Patient declines services at this time. No further needs from CM.      Barriers to Discharge: Continued Medical Work up   Patient Goals and CMS Choice Patient states their goals for this hospitalization and ongoing recovery are:: to return home      Discharge Placement                       Discharge Plan and Services                                     Social Determinants of Health (SDOH) Interventions     Readmission Risk Interventions Readmission Risk Prevention Plan 04/27/2019  PCP or Specialist Appt within 3-5 Days Complete  Medication Review (RN Care Manager) Complete  Some recent data might be hidden

## 2019-04-28 NOTE — Progress Notes (Signed)
Patient ID: Deborah Ho, female   DOB: 1951/09/18, 68 y.o.   MRN: [id number]     Advanced Heart Failure Rounding Note  PCP-Cardiologist: Peter Swaziland, MD   Subjective:    Weight down again, now > 50 lbs.  Creatinine fairly stable at 1.8.  CVP 9, co-ox 71%.    Abdominal US: Small to moderate ascites   Objective:   Weight Range: 87 kg Body mass index is 31.9 kg/m.   Vital Signs:   Temp:  [97.6 F (36.4 C)] 97.6 F (36.4 C) (06/12 0629) Pulse Rate:  [83-89] 83 (06/12 0629) Resp:  [17] 17 (06/12 0629) BP: (92-126)/(56-79) 103/65 (06/12 0629) SpO2:  [91 %-96 %] 96 % (06/12 0629) Weight:  [87 kg] 87 kg (06/12 0629) Last BM Date: 04/26/19  Weight change: Filed Weights   04/26/19 0700 04/27/19 0636 04/28/19 0629  Weight: 90.5 kg 87.8 kg 87 kg    Intake/Output:   Intake/Output Summary (Last 24 hours) at 04/28/2019 0744 Last data filed at 04/28/2019 0634 Gross per 24 hour  Intake 761.38 ml  Output 2800 ml  Net -2038.62 ml      Physical Exam    General: NAD Neck: No JVD, no thyromegaly or thyroid nodule.  Lungs: Clear to auscultation bilaterally with normal respiratory effort. CV: Lateral PMI.  Heart regular S1/S2, no S3/S4, no murmur. Trace ankle edema.   Abdomen: Soft, nontender, no hepatosplenomegaly, no distention.  Skin: Intact without lesions or rashes.  Neurologic: Alert and oriented x 3.  Psych: Normal affect. Extremities: No clubbing or cyanosis.  HEENT: Normal.    Telemetry   NSR 80s (personally reviewed)  Labs    CBC No results for input(s): WBC, NEUTROABS, HGB, HCT, MCV, PLT in the last 72 hours. Basic Metabolic Panel Recent Labs    62/95/28 0445 04/27/19 0446 04/28/19 0601  NA 141 138 136  K 3.6 3.5 3.5  CL 95* 92* 91*  CO2 32 33* 30  GLUCOSE 97 95 89  BUN 26* 32* 39*  CREATININE 1.73* 1.75* 1.82*  CALCIUM 9.8 9.7 9.7  MG 2.0  --   --    Liver Function Tests No results for input(s): AST, ALT, ALKPHOS, BILITOT, PROT, ALBUMIN in  the last 72 hours. No results for input(s): LIPASE, AMYLASE in the last 72 hours. Cardiac Enzymes No results for input(s): CKTOTAL, CKMB, CKMBINDEX, TROPONINI in the last 72 hours.  BNP: BNP (last 3 results) Recent Labs    12/15/18 2022 01/13/19 1603 04/20/19 0045  BNP 1,850.9* 1,888.6* 3,289.0*    ProBNP (last 3 results) No results for input(s): PROBNP in the last 8760 hours.   D-Dimer No results for input(s): DDIMER in the last 72 hours. Hemoglobin A1C No results for input(s): HGBA1C in the last 72 hours. Fasting Lipid Panel No results for input(s): CHOL, HDL, LDLCALC, TRIG, CHOLHDL, LDLDIRECT in the last 72 hours. Thyroid Function Tests No results for input(s): TSH, T4TOTAL, T3FREE, THYROIDAB in the last 72 hours.  Invalid input(s): FREET3  Other results:   Imaging    No results found.   Medications:     Scheduled Medications: . atorvastatin  80 mg Oral q1800  . benzonatate  100 mg Oral QHS  . carvedilol  3.125 mg Oral BID WC  . enoxaparin (LOVENOX) injection  40 mg Subcutaneous Daily  . ezetimibe  10 mg Oral Daily  . isosorbide-hydrALAZINE  1 tablet Oral TID  . magnesium oxide  400 mg Oral Q14 Days  . potassium chloride SA  20 mEq Oral Daily  . spironolactone  25 mg Oral Daily  . ticagrelor  60 mg Oral BID  . torsemide  60 mg Oral Daily    Infusions:   PRN Medications: acetaminophen **OR** acetaminophen, benzonatate, HYDROcodone-acetaminophen, loratadine, nitroGLYCERIN, ondansetron **OR** ondansetron (ZOFRAN) IV    Assessment/Plan   1. Acute/Chronic systolic CHF: Ischemic cardiomyopathy.  ECHO 1/20 with EF 15%, moderate MR and TR. Has refused ICD.  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 She was admitted again with marked volume overload. She has diuresed well, CVP down to 9 with co-ox 71%.  - Continue torsemide 60 mg daily with KCl 20 daily.  - Continue spironolactone 25 mg daily.   - Continue Coreg 3.125 mg bid.  - Continue  Bidil here, she will not be able to afford at home so will use hydralazine 37.5 mg tid + Imdur 60 daily.  Will hold off on ARB/ARNI/ACEI for now with elevated creatinine.  2. CAD: s/p anterior MI, PCI x2 LAD 04/2014. History of stent thrombosis probably due to noncompliance. She is allergic to aspirin. She has not had chest pain.  Presentation seems to be progression CHF rather than ACS.  - Continue Brilinta60 mg twice a day andatorvastatin 80 mg daily  3. CKD stage III:  Suspect cardiorenal syndrome, creatinine mildly higher at 1.8 today (baseline 1.5-1.6).   4. Abdominal distention: Mild to moderate ascites on [place], probably not enough to warrant paracentesis.  5. Disposition: She can go home today.  Will need followup in office in person 10-14 days, needs BMET next week.  Meds for discharge: Hydralazine 37.5 tid, Imdur 60 daily, torsemide 60 daily, Coreg 3.125 mg bid, KCl 20 daily, spironolactone 25 daily, ticagrelor 60 bid, atorvastatin 80 daily.  She refuses paramedicine.   Length of Stay: 8  Loralie Champagne, MD  04/28/2019, 7:44 AM  Advanced Heart Failure Team Pager [phone] (M-F; 7a - 4p)  Please contact [facility] for night-coverage after hours (4p -7a ) and weekends on [email or web address]

## 2019-05-12 ENCOUNTER — Encounter (HOSPITAL_COMMUNITY): Payer: Medicare Other | Admitting: Cardiology

## 2019-07-10 ENCOUNTER — Ambulatory Visit: Payer: Medicare Other | Admitting: Cardiology

## 2019-07-11 ENCOUNTER — Telehealth: Payer: Self-pay

## 2019-07-11 NOTE — Telephone Encounter (Signed)
Spoke to patient I received a message you wanted a call to discuss medications.She stated her appointment with Dr.Jordan 8/24 was cancelled.Stated she came to office not feeling good.She was having flu like symptoms,body aches.She was late to her appointment and due to flu like symptoms appointment was cancelled.Appointment with Dr.Jordan was rescheduled to 11/25 at 11:00 am.She wanted to be seen sooner to discuss medications.Appointment scheduled with Almyra Deforest PA 9/3 at 10:15 am.Advised to bring all medications to appointment.

## 2019-07-20 ENCOUNTER — Ambulatory Visit: Payer: Medicare PPO | Admitting: Physician Assistant

## 2019-07-20 NOTE — Progress Notes (Deleted)
Cardiology Office Note    Date:  07/20/2019   ID:  [name], DOB 1951-05-21, MRN [id number]  PCP:  [facility]  Cardiologist:  Dr. [place]az[place] / Dr. Shirlee Latch - CHF clinic   No chief complaint on file.   History of Present Illness:  Deborah Ho is a 68 y.o. female with past medical history of hypertension, hyperlipidemia, tobacco abuse, CAD,ischemic cardiomyopathy, and chronic systolic heart failure. Patient had a history of anterior STEMI on 05/03/2014 with early in-stent thrombosis for missed dose of Brilinta, this was treated with 2 drug-eluting stentsto the LAD on 05/09/2014. Echocardiogram obtained on 05/10/2014 showed EF 45 to 50%. However repeat echocardiogram on 08/14/2014 showed EF 15%, grade 3 DD. Ejection fraction improved to 25% by September 2016 on echocardiogram. Since then, her ejection fraction has been hovering between 15% to 20%on multiple echocardiogram. Last echocardiogram was obtained during her admission for acute heart failure in January 2019, this showed EF 10 to 15%, doppler parameters consistent with restrictive physiology, moderate TR, PA peak pressure 31 mmHg. Her last heart failure clinic visit was on November 18, 2017, her weight at the time was 231 whereas her baseline dry weight is 206 pounds.This is significant weight gain despite increasing torsemide to 60 mg twice daily, she also refused to rechallenge Entresto. She was directly admitted to [facility]. She was diuresed with IV Lasix and metolazone and was eventually transitioned to torsemide 80 mg twice daily. Heart failure medication was titrated as tolerated, however patient continued to refuse aggressive titration or additional beta-blocker. She was eventually diuresed to 16.1 L and a discharged with a newdry weight of 192 pounds. She failed to follow-up with heart failure clinic as outpatient and ended up back in the hospital on 03/11/2018 with weight of 243  pounds. She previously fired Dr. Gala Romney.She apparently also ran out of one ofthe heart failure medications well and there was also missing her Lasix dose as well. Compliance with medication was poor. She was eventually discharged on 04/14/2018 with dry weight of 204 pounds. She was discharged on Lasix 80 mg twice daily, spironolactone 25 mg daily, and metolazone 3 times weekly.  I saw the patient for delayed post hospital follow-up on 04/06/2018.  At the time, she was not taking BiDil or her spironolactone.  She was however compliant with her Lasix and metolazone.  By the time I saw her, she has lost another 26 pounds and her weight at the time was 178 pounds.  She says she could not tolerated the Entresto due to dizziness.  I restarted her on the spironolactone 12.5 mg daily.  1 week lab work showed stable renal function and electrolyte.  I advised her to follow-up with heart failure service in 3 to 4 weeks for medication titration, however it appears she did not show up for her visit on 05/23/2018.   Past Medical History:  Diagnosis Date  . CAD (coronary artery disease) 05/03/14; 05/09/14   a. anterior STEMI with early in-stent thorombosis for missed dose of Brillinta s/p PCI with DESx 2 into LAD (04/2014)  . CHF (congestive heart failure) (HCC)   . GERD (gastroesophageal reflux disease)    Probable  . HTN (hypertension)   . Hyperlipidemia   . Ischemic cardiomyopathy    a. 04/2014 ECHO with EF 45-50% b.  Repeat 2D echo 08/14/14 with EF down at 15%. Life vest placed  . Non compliance w medication regimen   . Tobacco use  Past Surgical History:  Procedure Laterality Date  . CHOLECYSTECTOMY    . CORONARY ANGIOPLASTY WITH STENT PLACEMENT  05/03/14   STEMI- stent to LAD DES- Xience alpine  . CORONARY ANGIOPLASTY WITH STENT PLACEMENT  05/09/14   STEMI- overlapping stent to LAD, pt had missed a dose of Brilinta  . LEFT HEART CATHETERIZATION WITH CORONARY ANGIOGRAM N/A 05/03/2014   Procedure:  LEFT HEART CATHETERIZATION WITH CORONARY ANGIOGRAM;  Surgeon: Peter M Martinique, MD;  Location: [facility] CATH LAB;  Service: Cardiovascular;  Laterality: N/A;  . LEFT HEART CATHETERIZATION WITH CORONARY ANGIOGRAM N/A 05/09/2014   Procedure: LEFT HEART CATHETERIZATION WITH CORONARY ANGIOGRAM;  Surgeon: [name], MD;  Location: [facility] CATH LAB;  Service: Cardiovascular;  Laterality: N/A;  . PARTIAL HYSTERECTOMY    . PERCUTANEOUS STENT INTERVENTION  05/03/2014   Procedure: PERCUTANEOUS STENT INTERVENTION;  Surgeon: Peter M Martinique, MD;  Location: [facility] CATH LAB;  Service: Cardiovascular;;  DES Prox LAD     Current Medications: Outpatient Medications Prior to Visit  Medication Sig Dispense Refill  . atorvastatin (LIPITOR) 40 MG tablet Take 2 tablets (80 mg total) by mouth daily at 6 PM. 30 tablet 5  . carvedilol (COREG) 3.125 MG tablet Take 1 tablet (3.125 mg total) by mouth 2 (two) times daily with a meal. 60 tablet 1  . cetirizine (ZYRTEC) 10 MG tablet Take 10 mg daily by mouth.     . cholecalciferol (VITAMIN D) 1000 units tablet Take 1,000 Units by mouth every 14 (fourteen) days. Twice a month    . Ferrous Sulfate (IRON PO) Take 1 tablet by mouth 3 (three) times a week. Take on MWF    . fluticasone (FLONASE) 50 MCG/ACT nasal spray Place 1 spray into both nostrils daily as needed for allergies (congestion).     . hydrALAZINE (APRESOLINE) 25 MG tablet Take 1.5 tablets (37.5 mg total) by mouth 3 (three) times daily. 120 tablet 6  . isosorbide mononitrate (IMDUR) 30 MG 24 hr tablet Take 2 tablets (60 mg total) by mouth daily. 60 tablet 6  . magnesium oxide (MAG-OX) 400 (241.3 Mg) MG tablet Take 400 mg by mouth every 14 (fourteen) days. Twice a month    . nitroGLYCERIN (NITROSTAT) 0.4 MG SL tablet Place 0.4 mg under the tongue every 5 (five) minutes as needed for chest pain.    . potassium chloride SA (K-DUR,KLOR-CON) 20 MEQ tablet Take 1 tablet (20 mEq total) by mouth daily. 30 tablet 0  . spironolactone  (ALDACTONE) 25 MG tablet Take 1 tablet (25 mg total) by mouth daily. 30 tablet 6  . ticagrelor (BRILINTA) 60 MG TABS tablet Take 1 tablet (60 mg total) by mouth 2 (two) times daily. 60 tablet 0  . torsemide (DEMADEX) 20 MG tablet Take 3 tablets (60 mg total) by mouth daily. 90 tablet 6   No facility-administered medications prior to visit.      Allergies:   Aspirin, Effient [prasugrel], Entresto [sacubitril-valsartan], Lactose intolerance (gi), Robitussin dm [guaifenesin-dm], Sulfa antibiotics, and Other   Social History   Socioeconomic History  . Marital status: Widowed    Spouse name: Not on file  . Number of children: Not on file  . Years of education: Not on file  . Highest education level: Not on file  Occupational History  . 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 Financial resource strain: Not on file  . Food insecurity    Worry: Not on file    Inability: Not on file  . Transportation  needs    Medical: Not on file    Non-medical: Not on file  Tobacco Use  . Smoking status: Current Some Day Smoker    Packs/day: 0.10    Years: 0.50    Pack years: 0.05    Types: Cigarettes  . Smokeless tobacco: Current User  . Tobacco comment: down to 3 cigarettes daily (08/03/14)  Substance and Sexual Activity  . Alcohol use: No  . Drug use: No  . Sexual activity: Not on file  Lifestyle  . Physical activity    Days per week: Not on file    Minutes per session: Not on file  . Stress: Not on file  Relationships  . Social Musician on phone: Not on file    Gets together: Not on file    Attends religious service: Not on file    Active member of club or organization: Not on file    Attends meetings of clubs or organizations: Not on file    Relationship status: Not on file  Other Topics Concern  . Not on file  Social History Narrative   Patient has 6 brothers and sisters and none have known coronary artery disease. She lives alone in [place], but has several  brothers in the area.     Family History:  The patient's ***family history includes Diabetes in her mother; Hypertension in her mother.   ROS:   Please see the history of present illness.    ROS All other systems reviewed and are negative.   PHYSICAL EXAM:   VS:  There were no vitals taken for this visit.   GEN: Well nourished, well developed, in no acute distress HEENT: normal Neck: no JVD, carotid bruits, or masses Cardiac: ***RRR; no murmurs, rubs, or gallops,no edema  Respiratory:  clear to auscultation bilaterally, normal work of breathing GI: soft, nontender, nondistended, + BS MS: no deformity or atrophy Skin: warm and dry, no rash Neuro:  Alert and Oriented x 3, Strength and sensation are intact Psych: euthymic mood, full affect  Wt Readings from Last 3 Encounters:  04/28/19 191 lb 11.2 oz (87 kg)  01/18/19 213 lb 13.5 oz (97 kg)  12/18/18 204 lb (92.5 kg)      Studies/Labs Reviewed:   EKG:  EKG is*** ordered today.  The ekg ordered today demonstrates ***  Recent Labs: 04/20/2019: B Natriuretic Peptide 3,289.0 04/21/2019: ALT 13; Hemoglobin 14.0; Platelets 217; TSH 1.992 04/26/2019: Magnesium 2.0 04/28/2019: BUN 39; Creatinine, Ser 1.82; Potassium 3.5; Sodium 136   Lipid Panel    Component Value Date/Time   CHOL 133 04/21/2019 0441   TRIG 106 04/21/2019 0441   HDL 29 (L) 04/21/2019 0441   CHOLHDL 4.6 04/21/2019 0441   VLDL 21 04/21/2019 0441   LDLCALC 83 04/21/2019 0441    Additional studies/ records that were reviewed today include:  ***    ASSESSMENT:    No diagnosis found.   PLAN:  In order of problems listed above:  1. ***    Medication Adjustments/Labs and Tests Ordered: Current medicines are reviewed at length with the patient today.  Concerns regarding medicines are outlined above.  Medication changes, Labs and Tests ordered today are listed in the Patient Instructions below. There are no Patient Instructions on file for this visit.    Ramond Dial, [place]  07/20/2019 9:34 AM    [facility] [address] [place], [place], [place]  [address] Phone: [phone]; Fax: [phone]

## 2019-09-07 ENCOUNTER — Ambulatory Visit (INDEPENDENT_AMBULATORY_CARE_PROVIDER_SITE_OTHER): Payer: Medicare PPO | Admitting: Podiatry

## 2019-09-07 ENCOUNTER — Encounter: Payer: Self-pay | Admitting: Podiatry

## 2019-09-07 ENCOUNTER — Other Ambulatory Visit: Payer: Self-pay

## 2019-09-07 VITALS — BP 145/78 | HR 87 | Resp 16

## 2019-09-07 DIAGNOSIS — Q828 Other specified congenital malformations of skin: Secondary | ICD-10-CM

## 2019-09-07 DIAGNOSIS — B351 Tinea unguium: Secondary | ICD-10-CM

## 2019-09-07 DIAGNOSIS — M79674 Pain in right toe(s): Secondary | ICD-10-CM | POA: Diagnosis not present

## 2019-09-07 DIAGNOSIS — M79675 Pain in left toe(s): Secondary | ICD-10-CM

## 2019-09-07 DIAGNOSIS — E669 Obesity, unspecified: Secondary | ICD-10-CM | POA: Insufficient documentation

## 2019-09-07 DIAGNOSIS — E559 Vitamin D deficiency, unspecified: Secondary | ICD-10-CM | POA: Insufficient documentation

## 2019-09-10 NOTE — Progress Notes (Signed)
Subjective:   Patient ID: Deborah Ho, female   DOB: 68 y.o.   MRN: [id number]   HPI Patient presents with several problems with 1 being chronic nasal disease that she cannot take care of herself and they are very thickened along with lesion formation that are chronic and dry skin condition.  Patient states she is tried healing cream without success and states that she does smoke to a mild nature and is not active currently   Review of Systems  All other systems reviewed and are negative.       Objective:  Physical Exam Vitals signs and nursing note reviewed.  Constitutional:      Appearance: She is well-developed.  Pulmonary:     Effort: Pulmonary effort is normal.  Musculoskeletal: Normal range of motion.  Skin:    General: Skin is warm.  Neurological:     Mental Status: She is alert.     Neurovascular status was found to be mildly diminished but intact with mild diminishment of sharp dull vibratory and having reduced range of motion subtalar midtarsal joint.  Patient is found to have severely thickened nailbeds 1-5 both feet that are elongated thickened yellow and painful when she tries to wear shoe gear along with lesion formation plantar aspect both feet that are painful and make it hard to bear weight properly.  Patient does have good digital perfusion and she is well oriented and has dry skin bilateral     Assessment:  Chronic mycotic nail infection and lesion formation bilateral that are painful along with dry skin formation bilateral     Plan:  H&P reviewed all conditions and today sterile debridement of all nailbeds 1-5 both feet accomplished along with debridement of lesions on both feet with no iatrogenic bleeding.  I then recommended creams for the dry skin and Vaseline as needed and patient will be seen back to recheck and may require more aggressive treatment depending on response

## 2019-10-08 NOTE — Progress Notes (Signed)
Cardiology Office Note    Date:  10/11/2019   ID:  Deborah Ho, DOB 05/14/1951, MRN [id number]  PCP:  [facility]  Cardiologist:  Dr. Martinique / Dr. Aundra Dubin - CHF clinic   Chief Complaint  Patient presents with   Congestive Heart Failure    History of Present Illness:  Deborah Ho is a 68 y.o. female with past medical history of hypertension, hyperlipidemia, tobacco abuse, CAD, ischemic cardiomyopathy, and chronic systolic heart failure.  Patient had a history of anterior STEMI on 05/03/2014 with early in-stent thrombosis for missed dose of Brilinta, this was treated with 2 drug-eluting stents to the LAD on 05/09/2014.  Echocardiogram obtained on 05/10/2014 showed EF 45 to 50%.  However repeat echocardiogram on 08/14/2014 showed EF 15%, grade 3 DD.  Ejection fraction improved to 25% by September 2016 on echocardiogram.  Since then, her ejection fraction has been hovering between 15% to 20% on multiple echocardiogram.  Last echocardiogram was obtained during her admission for acute heart failure in January 2019, this showed EF 10 to 15%, doppler parameters consistent with restrictive physiology, moderate TR, PA peak pressure 31 mmHg.  Her last heart failure clinic visit was on November 18, 2017, her weight at the time was 231 whereas her baseline dry weight is 206 pounds.  This is significant weight gain despite increasing torsemide to 60 mg twice daily, she also refused to rechallenge Entresto.  She was directly admitted to [facility].  She was diuresed with IV Lasix and metolazone and was eventually transitioned to torsemide 80 mg twice daily.  Heart failure medication was titrated as tolerated, however patient continued to refuse aggressive titration or additional beta-blocker.  She was eventually diuresed to 16.1 L and a discharged with a new dry weight of 192 pounds.  She failed to follow-up with heart failure clinic as outpatient and ended up back in the  hospital on 03/11/2018 with weight of 243 pounds.  She was eventually discharged on 04/14/2018 with dry weight of 204 pounds.  She was discharged on Lasix 80 mg twice daily, spironolactone 25 mg daily, and metolazone 3 times weekly. Since her initial event in 2015 she has had repeated hospital admissions for CHF exacerbation. She has been repeatedly noncompliant with medical therapy and outpatient follow up.   Last admitted in June 2020 with CHF exacerbation. Seen by Heart failure service. She was markedly volume overloaded.  Diuresed with lasix drip and later transitioned to torsemide 60 mg daily. Diuresed over 45 pounds. HF medications optimized. Discharge weight 191 pounds. She has refused ICD. Diuresed with lasix drip and metolazone. Once adequately diuresed she was placed on torsemide 60 mg daily. HF medications optimized.  Continued on spironolactone 25 mg daily,Coreg 3.125 mg bid. Placed on bidil but she has no insurance coverage. Switched to hydralazine 37.5 mg tid and imdur 60 daily.   This is her first follow up since then. She reports taking all her medication but did stop taking torsemide because it made her sick. Since October notes her breathing is "pulling". Reports compliance with fluid and sodium restriction. Weight at home has been stable but she notes it can vary a lot. No swelling in hands and feet but does note increased abdominal swelling.    Past Medical History:  Diagnosis Date   CAD (coronary artery disease) 05/03/14; 05/09/14   a. anterior STEMI with early in-stent thorombosis for missed dose of Brillinta s/p PCI with DESx 2 into LAD (04/2014)  CHF (congestive heart failure) (HCC)    GERD (gastroesophageal reflux disease)    Probable   HTN (hypertension)    Hyperlipidemia    Ischemic cardiomyopathy    a. 04/2014 ECHO with EF 45-50% b.  Repeat 2D echo 08/14/14 with EF down at 15%. Life vest placed   Non compliance w medication regimen    Tobacco use     Past  Surgical History:  Procedure Laterality Date   CHOLECYSTECTOMY     CORONARY ANGIOPLASTY WITH STENT PLACEMENT  05/03/14   STEMI- stent to LAD DES- Xience alpine   CORONARY ANGIOPLASTY WITH STENT PLACEMENT  05/09/14   STEMI- overlapping stent to LAD, pt had missed a dose of Brilinta   LEFT HEART CATHETERIZATION WITH CORONARY ANGIOGRAM N/A 05/03/2014   Procedure: LEFT HEART CATHETERIZATION WITH CORONARY ANGIOGRAM;  Surgeon: Chayden Garrelts M [place]Jordan, MD;  [facility];  Service: Cardiovascular;  Laterality: N/A;   LEFT HEART CATHETERIZATION WITH CORONARY ANGIOGRAM N/A 05/09/2014   Procedure: LEFT HEART CATHETERIZATION WITH CORONARY ANGIOGRAM;  Surgeon: Corky CraftsJayadeep S Varanasi, MD;  [facility];  Service: Cardiovascular;  Laterality: N/A;   PARTIAL HYSTERECTOMY     PERCUTANEOUS STENT INTERVENTION  05/03/2014   Procedure: PERCUTANEOUS STENT INTERVENTION;  Surgeon: Shiela Bruns M [place], MD;  [facility];  Service: Cardiovascular;;  DES Prox LAD     Current Medications: Outpatient Medications Prior to Visit  Medication Sig Dispense Refill   atorvastatin (LIPITOR) 40 MG tablet Take 2 tablets (80 mg total) by mouth daily at 6 PM. 30 tablet 5   carvedilol (COREG) 3.125 MG tablet Take 1 tablet (3.125 mg total) by mouth 2 (two) times daily with a meal. 60 tablet 1   cetirizine (ZYRTEC) 10 MG tablet Take 10 mg daily by mouth.      cholecalciferol (VITAMIN D) 1000 units tablet Take 1,000 Units by mouth every 14 (fourteen) days. Twice a month     Ferrous Sulfate (IRON PO) Take 1 tablet by mouth 3 (three) times a week. Take on MWF     fluticasone (FLONASE) 50 MCG/ACT nasal spray Place 1 spray into both nostrils daily as needed for allergies (congestion).      hydrALAZINE (APRESOLINE) 25 MG tablet Take 1.5 tablets (37.5 mg total) by mouth 3 (three) times daily. 120 tablet 6   isosorbide mononitrate (IMDUR) 30 MG 24 hr tablet Take 2 tablets (60 mg total) by mouth daily. 60 tablet 6    magnesium oxide (MAG-OX) 400 (241.3 Mg) MG tablet Take 400 mg by mouth every 14 (fourteen) days. Twice a month     nitroGLYCERIN (NITROSTAT) 0.4 MG SL tablet Place 0.4 mg under the tongue every 5 (five) minutes as needed for chest pain.     potassium chloride SA (K-DUR,KLOR-CON) 20 MEQ tablet Take 1 tablet (20 mEq total) by mouth daily. 30 tablet 0   spironolactone (ALDACTONE) 25 MG tablet Take 1 tablet (25 mg total) by mouth daily. 30 tablet 6   ticagrelor (BRILINTA) 60 MG TABS tablet Take 1 tablet (60 mg total) by mouth 2 (two) times daily. 60 tablet 0   torsemide (DEMADEX) 20 MG tablet Take 3 tablets (60 mg total) by mouth daily. 90 tablet 6   No facility-administered medications prior to visit.      Allergies:   Aspirin, Effient [prasugrel], Entresto [sacubitril-valsartan], Lactose intolerance (gi), Robitussin dm [guaifenesin-dm], Sulfa antibiotics, and Other   Social History   Socioeconomic History   Marital status: Widowed    Spouse  name: Not on file   Number of children: Not on file   Years of education: Not on file   Highest education level: Not on file  Occupational History   Occupation: not employed  Ecologist strain: Not on file   Food insecurity    Worry: Not on file    Inability: Not on file   Transportation needs    Medical: Not on file    Non-medical: Not on file  Tobacco Use   Smoking status: Current Some Day Smoker    Packs/day: 0.10    Years: 0.50    Pack years: 0.05    Types: Cigarettes   Smokeless tobacco: Current User   Tobacco comment: down to 3 cigarettes daily (08/03/14)  Substance and Sexual Activity   Alcohol use: No   Drug use: No   Sexual activity: Not on file  Lifestyle   Physical activity    Days per week: Not on file    Minutes per session: Not on file   Stress: Not on file  Relationships   Social connections    Talks on phone: Not on file    Gets together: Not on file    Attends religious  service: Not on file    Active member of club or organization: Not on file    Attends meetings of clubs or organizations: Not on file    Relationship status: Not on file  Other Topics Concern   Not on file  Social History Narrative   Patient has 6 brothers and sisters and none have known coronary artery disease. She lives alone in [place], but has several brothers in the area.     Family History:  The patient's family history includes Diabetes in her mother; Hypertension in her mother.   ROS:   Please see the history of present illness.    ROS All other systems reviewed and are negative.   PHYSICAL EXAM:   VS:  BP (!) 149/95    Pulse 83    Ht 5' 5.5" (1.664 m)    Wt 195 lb (88.5 kg)    SpO2 96%    BMI 31.96 kg/m    GENERAL:  Obese BF in a wheelchair.  HEENT:  PERRL, EOMI, sclera are clear. Oropharynx is clear. NECK:  JVD is elevated, carotid upstroke brisk and symmetric, no bruits, no thyromegaly or adenopathy LUNGS:  Clear to auscultation bilaterally CHEST:  Unremarkable HEART:  RRR,  PMI not displaced or sustained,S1 and S2 within normal limits, no S3, no S4: no clicks, no rubs, no murmurs ABD:  Soft, nontender. BS +,appears quite distended.  EXT:  2 + pulses throughout, no edema, no cyanosis no clubbing SKIN:  Warm and dry.  No rashes NEURO:  Alert and oriented x 3. Cranial nerves II through XII intact. PSYCH:  Cognitively intact    Wt Readings from Last 3 Encounters:  10/11/19 195 lb (88.5 kg)  04/28/19 191 lb 11.2 oz (87 kg)  01/18/19 213 lb 13.5 oz (97 kg)      Studies/Labs Reviewed:   EKG:  EKG is not ordered today.    Recent Labs: 04/20/2019: B Natriuretic Peptide 3,289.0 04/21/2019: ALT 13; Hemoglobin 14.0; Platelets 217; TSH 1.992 04/26/2019: Magnesium 2.0 04/28/2019: BUN 39; Creatinine, Ser 1.82; Potassium 3.5; Sodium 136   Lipid Panel    Component Value Date/Time   CHOL 133 04/21/2019 0441   TRIG 106 04/21/2019 0441   HDL 29 (L) 04/21/2019 0441  CHOLHDL 4.6 04/21/2019 0441   VLDL 21 04/21/2019 0441   LDLCALC 83 04/21/2019 0441    Additional studies/ records that were reviewed today include:   Cath 05/03/2014 PROCEDURAL FINDINGS Hemodynamics: AO 162/105 mean 130 mm Hg LV 158/26 mm Hg  Coronary angiography: Coronary dominance: right  Left mainstem: Normal  Left anterior descending (LAD): 95% proximal LAD stenosis with TIMI 2 flow.  Ramus intermediate: moderate sized vessel. Mild disease less than 20%.  Left circumflex (LCx): The LCx has 30% proximal disease. The LCx trifurcates into 3 OM branches. After the first branch there is a 60-70% stenosis in the OM prior to the next bifurcation.   Right coronary artery (RCA): The RCA arises low in the coronary cusp and was engaged with a Williams right catheter. This demonstrates a long segment of disease with 70-80% disease proximally tapering to 80-90% in the mid vessel.   Left ventriculography: Left ventricular systolic function is abnormal, LVEF is estimated at 35%, there is severe anterior hypokinesis with akinesis of the anterior apex and inferior apex. There is mild mitral regurgitation   PCI Note: Following the diagnostic procedure, the decision was made to proceed with PCI. Weight-based bivalirudin was given for anticoagulation. Brilinta 180 mg was given orally. Once a therapeutic ACT was achieved, a 6 [place] XBLAD 3.5 guide catheter was inserted. A prowater coronary guidewire was used to cross the lesion. The lesion was predilated with a 2.0 mm balloon. The lesion was then stented with a 3.0 x 23 mm Xience Alpine stent. The stent was postdilated with a 3.25 mm noncompliant balloon. Following PCI, there was 0% residual stenosis and TIMI-3 flow. Final angiography confirmed an excellent result. The patient tolerated the procedure well. There were no immediate procedural complications. A TR band was used for radial hemostasis. The patient was transferred to the post  catheterization recovery area for further monitoring.  PCI Data: Vessel - LAD/Segment - proximal Percent Stenosis (pre) 95% TIMI-flow 2 Stent 3.0 x 23 mm Xience Alpine Percent Stenosis (post) 0% TIMI-flow (post) 3  Final Conclusions:  1. Severe 3 vessel obstructive CAD. Culprit lesion in the proximal LAD. 2. Severe LV dysfunction. 3. Successful stenting of the proximal LAD with a DES.   Recommendations:  Continue DAPT for one year. IV diuresis. BP control. May consider patient for enrollment in Complete trial for her residual disease.    Cath 05/09/2014 IMPRESSIONS:  1. Normal left main coronary artery. 2. Subacute stent thrombosis of the day stent in the mid left anterior descending artery. Successful thrombectomy was performed with restoration of TIMI-3 flow. Intravascular ultrasound revealed a vessel diameter of about 3.5 mm. An overlapping stent was placed. This is a 3.0 x 23, overlapping the distal edge of the prior stent. The entire stented segment was post dilated with a 3.5 x 20 noncompliant balloon. Intravascular ultrasound was performed again showing excellent stent apposition and large lumen diameter.. 3. Moderate disease in the left circumflex artery and its branches. 4. Severe disease in the right coronary artery. 5. Left ventricular systolic function not assessed. LVEDP 23 mmHg. Marland Kitchen  RECOMMENDATION:Continue dual antiplatelet therapy. IV tirofiban will be continued as well. Continue medical therapy for RCA disease. We stressed the importance of taking her medications as prescribed. Continue aggressive secondary prevention. She would benefit from cardiac rehabilitation. We'll watch in the ICU.    Echo 11/21/2017 LV EF: 10% - 15%  ------------------------------------------------------------------- Indications: CHF - 428.0.  ------------------------------------------------------------------- History: PMH: Coronary artery disease. Congestive  heart failure. Cardiomyopathy of  unknown etiology. PMH: Myocardial infarction. Risk factors: Current tobacco use. Hypertension. Dyslipidemia.  ------------------------------------------------------------------- Study Conclusions  - Left ventricle: The cavity size was mildly dilated. Wall thickness was normal. The estimated ejection fraction was in the range of 10% to 15%. Doppler parameters are consistent with restrictive physiology, indicative of decreased left ventricular diastolic compliance and/or increased left atrial pressure. - Mitral valve: There was mild regurgitation. - Left atrium: The atrium was mildly dilated. - Right ventricle: The cavity size was mildly dilated. Systolic function was moderately to severely reduced. - Tricuspid valve: There was moderate regurgitation. - Pulmonary arteries: PA peak pressure: 31 mm Hg (S).  Impressions:  - Technically very limited study due to poor sound wave transmission. There severe global LV dysfunction with moderate to sevre RV dysfunction.  Echo 11/28/18: Study Conclusions  - Left ventricle: The cavity size was normal. Wall thickness was   increased in a pattern of mild LVH. The estimated ejection   fraction was 15%. Diffuse hypokinesis. No mural thrombus by   Definity contrast. Doppler parameters are consistent with   restrictive left ventricular relaxation (grade 3 diastolic   dysfunction). The E/e&' ratio is >20, suggesting markedly elevated   LV filling pressure. - Mitral valve: Mildly thickened leaflets . There was moderate   regurgitation. - Left atrium: The atrium was normal in size. - Right ventricle: The cavity size was moderately dilated. Mild   systolic dysfunction. - Right atrium: Severely dilated. - Tricuspid valve: There was moderate regurgitation. - Pulmonary arteries: PA peak pressure: 53 mm Hg (S). - Inferior vena cava: The vessel was dilated. The respirophasic   diameter changes  were blunted (< 50%), consistent with elevated   central venous pressure.    ASSESSMENT:    1. Acute on chronic combined systolic and diastolic CHF (congestive heart failure) (HCC)   2. Ischemic cardiomyopathy   3. Essential hypertension   4. Stage 3b chronic kidney disease   5. Noncompliance with medication regimen      PLAN:  In order of problems listed above:  1. Acute on Chronic systolic/diastolic heart failure: weight is up 3 lbs since hospital DC in June. She does appear to have significantly increased abdominal girth. Has not been taking torsemide. Will start lasix 40 mg bid. Otherwise continue Coreg, spironolactone, imdur and hydralazine. Discussed importance of sodium and fluid restriction. Will check BMET and BNP today. Follow up in 4 weeks. Based on extensive history there is a disconnect with the patient's understanding of her situation and treatment. Compliance is still an issue.  2. Ischemic cardiomyopathy: EF 15-20%. Chronic.  She could not tolerate Entresto due to side effect of dizziness.    3. Hypertension: Blood pressure stable  4. Hyperlipidemia: On Lipitor 40 mg daily  5. CAD s/p remote anterior MI with early stent thrombosis due to noncompliance. On chronic Brilinta.     Medication Adjustments/Labs and Tests Ordered: Current medicines are reviewed at length with the patient today.  Concerns regarding medicines are outlined above.  Medication changes, Labs and Tests ordered today are listed in the Patient Instructions below. Patient Instructions  Continue your current therapy  We will add furosemide 40 mg twice a day  Follow up in 4 weeks.       Signed, Malanie Koloski [place], MD  10/11/2019 11:25 AM    [facility] [address], [place], [place]  [address] Phone: [phone]; Fax: [phone]

## 2019-10-11 ENCOUNTER — Emergency Department (HOSPITAL_COMMUNITY): Payer: Medicare PPO

## 2019-10-11 ENCOUNTER — Encounter (HOSPITAL_COMMUNITY): Payer: Self-pay

## 2019-10-11 ENCOUNTER — Ambulatory Visit (INDEPENDENT_AMBULATORY_CARE_PROVIDER_SITE_OTHER): Payer: Medicare PPO | Admitting: Cardiology

## 2019-10-11 ENCOUNTER — Inpatient Hospital Stay (HOSPITAL_COMMUNITY)
Admission: EM | Admit: 2019-10-11 | Discharge: 2019-10-21 | DRG: 291 | Disposition: A | Discharge status: Home Health | Payer: Medicare PPO | Attending: Internal Medicine | Admitting: Internal Medicine

## 2019-10-11 ENCOUNTER — Other Ambulatory Visit: Payer: Self-pay

## 2019-10-11 ENCOUNTER — Encounter: Payer: Self-pay | Admitting: Cardiology

## 2019-10-11 VITALS — BP 149/95 | HR 83 | Ht 65.5 in | Wt 195.0 lb

## 2019-10-11 DIAGNOSIS — Z955 Presence of coronary angioplasty implant and graft: Secondary | ICD-10-CM

## 2019-10-11 DIAGNOSIS — Z7902 Long term (current) use of antithrombotics/antiplatelets: Secondary | ICD-10-CM

## 2019-10-11 DIAGNOSIS — Z79899 Other long term (current) drug therapy: Secondary | ICD-10-CM

## 2019-10-11 DIAGNOSIS — E876 Hypokalemia: Secondary | ICD-10-CM | POA: Diagnosis present

## 2019-10-11 DIAGNOSIS — I5021 Acute systolic (congestive) heart failure: Secondary | ICD-10-CM | POA: Insufficient documentation

## 2019-10-11 DIAGNOSIS — I130 Hypertensive heart and chronic kidney disease with heart failure and stage 1 through stage 4 chronic kidney disease, or unspecified chronic kidney disease: Principal | ICD-10-CM | POA: Diagnosis present

## 2019-10-11 DIAGNOSIS — I10 Essential (primary) hypertension: Secondary | ICD-10-CM | POA: Diagnosis not present

## 2019-10-11 DIAGNOSIS — E739 Lactose intolerance, unspecified: Secondary | ICD-10-CM | POA: Diagnosis present

## 2019-10-11 DIAGNOSIS — E785 Hyperlipidemia, unspecified: Secondary | ICD-10-CM | POA: Diagnosis present

## 2019-10-11 DIAGNOSIS — I5043 Acute on chronic combined systolic (congestive) and diastolic (congestive) heart failure: Secondary | ICD-10-CM | POA: Diagnosis not present

## 2019-10-11 DIAGNOSIS — R140 Abdominal distension (gaseous): Secondary | ICD-10-CM | POA: Diagnosis present

## 2019-10-11 DIAGNOSIS — Y92009 Unspecified place in unspecified non-institutional (private) residence as the place of occurrence of the external cause: Secondary | ICD-10-CM

## 2019-10-11 DIAGNOSIS — Z888 Allergy status to other drugs, medicaments and biological substances status: Secondary | ICD-10-CM

## 2019-10-11 DIAGNOSIS — Z886 Allergy status to analgesic agent status: Secondary | ICD-10-CM

## 2019-10-11 DIAGNOSIS — I255 Ischemic cardiomyopathy: Secondary | ICD-10-CM | POA: Diagnosis not present

## 2019-10-11 DIAGNOSIS — N1832 Chronic kidney disease, stage 3b: Secondary | ICD-10-CM | POA: Diagnosis present

## 2019-10-11 DIAGNOSIS — Z9111 Patient's noncompliance with dietary regimen: Secondary | ICD-10-CM

## 2019-10-11 DIAGNOSIS — T501X6A Underdosing of loop [high-ceiling] diuretics, initial encounter: Secondary | ICD-10-CM | POA: Diagnosis present

## 2019-10-11 DIAGNOSIS — Z23 Encounter for immunization: Secondary | ICD-10-CM

## 2019-10-11 DIAGNOSIS — R188 Other ascites: Secondary | ICD-10-CM | POA: Diagnosis not present

## 2019-10-11 DIAGNOSIS — N1831 Chronic kidney disease, stage 3a: Secondary | ICD-10-CM

## 2019-10-11 DIAGNOSIS — Z91048 Other nonmedicinal substance allergy status: Secondary | ICD-10-CM

## 2019-10-11 DIAGNOSIS — I2510 Atherosclerotic heart disease of native coronary artery without angina pectoris: Secondary | ICD-10-CM | POA: Diagnosis present

## 2019-10-11 DIAGNOSIS — D509 Iron deficiency anemia, unspecified: Secondary | ICD-10-CM | POA: Diagnosis present

## 2019-10-11 DIAGNOSIS — R17 Unspecified jaundice: Secondary | ICD-10-CM | POA: Diagnosis not present

## 2019-10-11 DIAGNOSIS — Z91128 Patient's intentional underdosing of medication regimen for other reason: Secondary | ICD-10-CM

## 2019-10-11 DIAGNOSIS — Z515 Encounter for palliative care: Secondary | ICD-10-CM | POA: Diagnosis not present

## 2019-10-11 DIAGNOSIS — N183 Chronic kidney disease, stage 3 unspecified: Secondary | ICD-10-CM | POA: Diagnosis present

## 2019-10-11 DIAGNOSIS — I5033 Acute on chronic diastolic (congestive) heart failure: Secondary | ICD-10-CM

## 2019-10-11 DIAGNOSIS — Z7189 Other specified counseling: Secondary | ICD-10-CM

## 2019-10-11 DIAGNOSIS — Z8249 Family history of ischemic heart disease and other diseases of the circulatory system: Secondary | ICD-10-CM

## 2019-10-11 DIAGNOSIS — I5023 Acute on chronic systolic (congestive) heart failure: Secondary | ICD-10-CM | POA: Diagnosis not present

## 2019-10-11 DIAGNOSIS — Z882 Allergy status to sulfonamides status: Secondary | ICD-10-CM

## 2019-10-11 DIAGNOSIS — Z9114 Patient's other noncompliance with medication regimen: Secondary | ICD-10-CM

## 2019-10-11 DIAGNOSIS — Z20828 Contact with and (suspected) exposure to other viral communicable diseases: Secondary | ICD-10-CM | POA: Diagnosis present

## 2019-10-11 DIAGNOSIS — E669 Obesity, unspecified: Secondary | ICD-10-CM | POA: Diagnosis present

## 2019-10-11 DIAGNOSIS — Z6836 Body mass index (BMI) 36.0-36.9, adult: Secondary | ICD-10-CM

## 2019-10-11 DIAGNOSIS — I252 Old myocardial infarction: Secondary | ICD-10-CM

## 2019-10-11 DIAGNOSIS — F17210 Nicotine dependence, cigarettes, uncomplicated: Secondary | ICD-10-CM | POA: Diagnosis present

## 2019-10-11 LAB — CBC
HCT: 48.5 % — ABNORMAL HIGH (ref 36.0–46.0)
Hemoglobin: 14.6 g/dL (ref 12.0–15.0)
MCH: 27.9 pg (ref 26.0–34.0)
MCHC: 30.1 g/dL (ref 30.0–36.0)
MCV: 92.6 fL (ref 80.0–100.0)
Platelets: 227 10*3/uL (ref 150–400)
RBC: 5.24 MIL/uL — ABNORMAL HIGH (ref 3.87–5.11)
RDW: 15.7 % — ABNORMAL HIGH (ref 11.5–15.5)
WBC: 5.9 10*3/uL (ref 4.0–10.5)
nRBC: 0 % (ref 0.0–0.2)

## 2019-10-11 LAB — BASIC METABOLIC PANEL
Anion gap: 15 (ref 5–15)
BUN: 24 mg/dL — ABNORMAL HIGH (ref 8–23)
CO2: 18 mmol/L — ABNORMAL LOW (ref 22–32)
Calcium: 9.8 mg/dL (ref 8.9–10.3)
Chloride: 112 mmol/L — ABNORMAL HIGH (ref 98–111)
Creatinine, Ser: 1.54 mg/dL — ABNORMAL HIGH (ref 0.44–1.00)
GFR calc Af Amer: 40 mL/min — ABNORMAL LOW (ref >60)
GFR calc non Af Amer: 34 mL/min — ABNORMAL LOW (ref >60)
Glucose, Bld: 100 mg/dL — ABNORMAL HIGH (ref 70–99)
Potassium: 4.1 mmol/L (ref 3.5–5.1)
Sodium: 145 mmol/L (ref 135–145)

## 2019-10-11 LAB — TROPONIN I (HIGH SENSITIVITY): Troponin I (High Sensitivity): 17 ng/L (ref <18)

## 2019-10-11 LAB — BRAIN NATRIURETIC PEPTIDE: B Natriuretic Peptide: 2307 pg/mL — ABNORMAL HIGH (ref 0.0–100.0)

## 2019-10-11 LAB — POC SARS CORONAVIRUS 2 AG -  ED: SARS Coronavirus 2 Ag: NEGATIVE

## 2019-10-11 MED ORDER — FUROSEMIDE 40 MG PO TABS: 40.0000 mg | ORAL_TABLET | Freq: Two times a day (BID) | ORAL | 3 refills | Status: DC

## 2019-10-11 MED ORDER — FUROSEMIDE 10 MG/ML IJ SOLN
40.0000 mg | Freq: Once | INTRAMUSCULAR | Status: AC
  Administered 2019-10-11: 21:00:00 40 mg via INTRAVENOUS
  Filled 2019-10-11: qty 4

## 2019-10-11 MED ORDER — ONDANSETRON HCL 4 MG/2ML IJ SOLN
4.0000 mg | Freq: Once | INTRAMUSCULAR | Status: AC
  Administered 2019-10-11: 21:00:00 4 mg via INTRAVENOUS
  Filled 2019-10-11: qty 2

## 2019-10-11 NOTE — H&P (Addendum)
History and Physical   Deborah Ho ZOX:[id number] DOB: 09/04/1951 DOA: 10/11/2019  Referring MD/NP/PA: Dr. Jacqulyn BathLong, EDP PCP: [facility] Outpatient Specialists: Dr. SwazilandJordan / Dr. Shirlee LatchMcLean - CHF clinic  Patient coming from: Cardiology clinic  Chief Complaint: Swelling, dyspnea on exertion  HPI: Deborah Ho is a 68 y.o. female with a history of chronic HFrEF (EF 15%), HTN, HLD, CAD s/p DESx2 2015, ICM, stage IIIb CKD, and tobacco use   She had a routine follow up with cardiology this morning where she reported some shortness of breath, fluctuating weights with abdominal swelling, and recent nonadherence to torsemide. She states torsemide "made me feel like there was another person inside of me" and that she couldn't tolerate it, so didn't take it for about a month. This was replaced with lasix, sent to her pharmacy at that visit, and she went home. She did not take lasix yet, but due to increasing abdominal girth and worsening shortness of breath/inability to take deep breaths she called a taxi and presented to the ED. She was noted to be 4 lbs up from previous EDW with significant abdominal distention confirmed to have moderate ascites per EDP bedside U/S. She remained severely dyspneic limiting mobility so cardiology was contacted and recommended hospitalist admission for diuresis. Lasix IV was given and the patient has already voided many times and reports significant improvement in abdominal swelling, still moderately short of breath with exertion.   Review of Systems: Denies fever, chills, changes in vision or hearing, headache, cough, sore throat, chest pain, palpitations, vomiting, changes in bowel habits, blood in stool, change in bladder habits, myalgias, arthralgias, rash, and per HPI. All others reviewed and are negative.   Past Medical History:  Diagnosis Date  . CAD (coronary artery disease) 05/03/14; 05/09/14   a. anterior STEMI with early in-stent  thorombosis for missed dose of Brillinta s/p PCI with DESx 2 into LAD (04/2014)  . CHF (congestive heart failure) (HCC)   . GERD (gastroesophageal reflux disease)    Probable  . HTN (hypertension)   . Hyperlipidemia   . Ischemic cardiomyopathy    a. 04/2014 ECHO with EF 45-50% b.  Repeat 2D echo 08/14/14 with EF down at 15%. Life vest placed  . Non compliance w medication regimen   . Tobacco use    Past Surgical History:  Procedure Laterality Date  . CHOLECYSTECTOMY    . CORONARY ANGIOPLASTY WITH STENT PLACEMENT  05/03/14   STEMI- stent to LAD DES- Xience alpine  . CORONARY ANGIOPLASTY WITH STENT PLACEMENT  05/09/14   STEMI- overlapping stent to LAD, pt had missed a dose of Brilinta  . LEFT HEART CATHETERIZATION WITH CORONARY ANGIOGRAM N/A 05/03/2014   Procedure: LEFT HEART CATHETERIZATION WITH CORONARY ANGIOGRAM;  Surgeon: Peter M SwazilandJordan, MD;  [facility];  Service: Cardiovascular;  Laterality: N/A;  . LEFT HEART CATHETERIZATION WITH CORONARY ANGIOGRAM N/A 05/09/2014   Procedure: LEFT HEART CATHETERIZATION WITH CORONARY ANGIOGRAM;  Surgeon: Corky CraftsJayadeep S Varanasi, MD;  [facility];  Service: Cardiovascular;  Laterality: N/A;  . PARTIAL HYSTERECTOMY    . PERCUTANEOUS STENT INTERVENTION  05/03/2014   Procedure: PERCUTANEOUS STENT INTERVENTION;  Surgeon: Peter M SwazilandJordan, MD;  [facility];  Service: Cardiovascular;;  DES Prox LAD    - Did not report smoking or using alcohol or other substances recently.  reports that she has been smoking cigarettes. She has a 0.05 pack-year smoking history. She uses smokeless tobacco. She reports that she does  not drink alcohol or use drugs. Allergies  Allergen Reactions  . Aspirin Swelling    Chewable children's aspirin makes patients tongue and face swell  . Effient [Prasugrel] Swelling    Patient's tongue and face swells  . Entresto [Sacubitril-Valsartan] Other (See Comments)    dizziness  . Lactose Intolerance (Gi) Other (See  Comments)    REACTION: stomach upset  . Robitussin Dm [Guaifenesin-Dm] Swelling    Patient's tongue swells  . Sulfa Antibiotics Swelling  . Other     Had to replace "catgut" with clamps   Family History  Problem Relation Age of Onset  . Diabetes Mother   . Hypertension Mother    - Family history otherwise reviewed and not pertinent.  Prior to Admission medications   Medication Sig Start Date End Date Taking? Authorizing Provider  atorvastatin (LIPITOR) 40 MG tablet Take 2 tablets (80 mg total) by mouth daily at 6 PM. 04/28/19   Clegg, Amy D, NP  carvedilol (COREG) 3.125 MG tablet Take 1 tablet (3.125 mg total) by mouth 2 (two) times daily with a meal. 01/18/19   Gherghe, Vella Redhead, MD  cetirizine (ZYRTEC) 10 MG tablet Take 10 mg daily by mouth.     [provider]  cholecalciferol (VITAMIN D) 1000 units tablet Take 1,000 Units by mouth every 14 (fourteen) days. Twice a month    [provider]  Ferrous Sulfate (IRON PO) Take 1 tablet by mouth 3 (three) times a week. Take on MWF    [provider]  fluticasone (FLONASE) 50 MCG/ACT nasal spray Place 1 spray into both nostrils daily as needed for allergies (congestion).  04/04/18   [provider]  furosemide (LASIX) 40 MG tablet Take 1 tablet (40 mg total) by mouth 2 (two) times daily. 10/11/19 10/05/20  Martinique, Peter M, MD  hydrALAZINE (APRESOLINE) 25 MG tablet Take 1.5 tablets (37.5 mg total) by mouth 3 (three) times daily. 04/28/19   Clegg, Amy D, NP  isosorbide mononitrate (IMDUR) 30 MG 24 hr tablet Take 2 tablets (60 mg total) by mouth daily. 04/28/19   Clegg, Amy D, NP  magnesium oxide (MAG-OX) 400 (241.3 Mg) MG tablet Take 400 mg by mouth every 14 (fourteen) days. Twice a month    [provider]  nitroGLYCERIN (NITROSTAT) 0.4 MG SL tablet Place 0.4 mg under the tongue every 5 (five) minutes as needed for chest pain.    [provider]  potassium chloride SA (K-DUR,KLOR-CON) 20 MEQ tablet  Take 1 tablet (20 mEq total) by mouth daily. 12/04/18   Georgette Shell, MD  spironolactone (ALDACTONE) 25 MG tablet Take 1 tablet (25 mg total) by mouth daily. 04/28/19   Clegg, Amy D, NP  ticagrelor (BRILINTA) 60 MG TABS tablet Take 1 tablet (60 mg total) by mouth 2 (two) times daily. 04/02/17   Louellen Molder, MD    Physical Exam: Vitals:   10/11/19 1747 10/11/19 2035  BP: (!) 141/98 (!) 154/123  Pulse: 88 88  Resp: 16 20  Temp: 97.7 F (36.5 C)   TempSrc: Oral   SpO2: 97% 97%   Constitutional: 68 y.o. female in no distress, calm demeanor Eyes: Lids and conjunctivae normal, PERRL ENMT: Mucous membranes are moist. Posterior pharynx clear of any exudate or lesions. Fair dentition.  Neck: normal, supple, no masses, no thyromegaly Respiratory: Non-labored breathing at rest without accessory muscle use. Crackles and scattered expiratory wheezes bilaterally Cardiovascular: Regular rate and rhythm, no murmurs, rubs, or gallops. No carotid bruits. UTD  JVD. Woody LE edema. Palpable pedal pulses. Abdomen: Grossly distended without tenderness. Normoactive bowel sounds. No hepatosplenomegaly. GU: No indwelling catheter Musculoskeletal: No clubbing / cyanosis. No joint deformity upper and lower extremities. Good ROM, no contractures. Normal muscle tone.  Skin: Warm, dry. No rashes, wounds, or ulcers. No significant lesions noted.  Neurologic: CN II-XII grossly intact. Speech normal. No focal deficits in motor strength or sensation in all extremities.  Psychiatric: Alert and oriented x3. Judgment and insight appear slightly impaired. Mood euthymic with pleasant affect.   Labs on Admission: I have personally reviewed following labs and imaging studies  CBC: Recent Labs  Lab 10/11/19 1805  WBC 5.9  HGB 14.6  HCT 48.5*  MCV 92.6  PLT 227   Basic Metabolic Panel: Recent Labs  Lab 10/11/19 1151 10/11/19 1805  NA 144 145  K 4.6 4.1  CL 109* 112*  CO2 20 18*  GLUCOSE 91 100*  BUN  24 24*  CREATININE 1.37* 1.54*  CALCIUM 9.7 9.8   GFR: Estimated Creatinine Clearance: 38.8 mL/min (A) (by C-G formula based on SCr of 1.54 mg/dL (H)). Liver Function Tests: No results for input(s): AST, ALT, ALKPHOS, BILITOT, PROT, ALBUMIN in the last 168 hours. No results for input(s): LIPASE, AMYLASE in the last 168 hours. No results for input(s): AMMONIA in the last 168 hours. Coagulation Profile: No results for input(s): INR, PROTIME in the last 168 hours. Cardiac Enzymes: No results for input(s): CKTOTAL, CKMB, CKMBINDEX, TROPONINI in the last 168 hours. BNP (last 3 results) No results for input(s): PROBNP in the last 8760 hours. HbA1C: No results for input(s): HGBA1C in the last 72 hours. CBG: No results for input(s): GLUCAP in the last 168 hours. Lipid Profile: No results for input(s): CHOL, HDL, LDLCALC, TRIG, CHOLHDL, LDLDIRECT in the last 72 hours. Thyroid Function Tests: No results for input(s): TSH, T4TOTAL, FREET4, T3FREE, THYROIDAB in the last 72 hours. Anemia Panel: No results for input(s): VITAMINB12, FOLATE, FERRITIN, TIBC, IRON, RETICCTPCT in the last 72 hours. Urine analysis:    Component Value Date/Time   COLORURINE YELLOW 12/16/2018 0024   APPEARANCEUR CLEAR 12/16/2018 0024   LABSPEC 1.034 (H) 12/16/2018 0024   PHURINE 5.0 12/16/2018 0024   GLUCOSEU NEGATIVE 12/16/2018 0024   HGBUR NEGATIVE 12/16/2018 0024   BILIRUBINUR NEGATIVE 12/16/2018 0024   KETONESUR NEGATIVE 12/16/2018 0024   PROTEINUR 100 (A) 12/16/2018 0024   UROBILINOGEN 1.0 07/25/2015 1601   NITRITE NEGATIVE 12/16/2018 0024   LEUKOCYTESUR NEGATIVE 12/16/2018 0024    No results found for this or any previous visit (from the past 240 hour(s)).   Radiological Exams on Admission: Dg Chest 2 View  Result Date: 10/11/2019 CLINICAL DATA:  Shortness of breath. Congestive heart failure. Abdominal pain. EXAM: CHEST - 2 VIEW COMPARISON:  04/19/2019 FINDINGS: Chronic cardiomegaly, unchanged.  Pulmonary vascularity is normal. Lungs are clear. No significant bone abnormality. Aortic atherosclerosis. IMPRESSION: Chronic cardiomegaly.  No pulmonary edema. Aortic Atherosclerosis (ICD10-I70.0). Electronically Signed   By: Francene Boyers M.D.   On: 10/11/2019 18:36   Echocardiogram Jan 2020:  - Left ventricle: The cavity size was normal. Wall thickness was increased in a pattern of mild LVH. The estimated ejection fraction was 15%. Diffuse hypokinesis. No mural thrombus by Definity contrast. Doppler parameters are consistent with restrictive left ventricular relaxation (grade 3 diastolic dysfunction). The E/e&' ratio is >20, suggesting markedly elevated LV filling pressure. - Mitral valve: Mildly thickened leaflets . There was moderate regurgitation. - Left atrium: The atrium was normal in  size. - Right ventricle: The cavity size was moderately dilated. Mild systolic dysfunction. - Right atrium: Severely dilated. - Tricuspid valve: There was moderate regurgitation. - Pulmonary arteries: PA peak pressure: 53 mm Hg (S). - Inferior vena cava: The vessel was dilated. The respirophasic diameter changes were blunted (<50%), consistent with elevated central venous pressure  EKG: Independently reviewed. NSR with very low voltage and no ST elevations.  Assessment/Plan Active Problems:   Acute systolic CHF (congestive heart failure) (HCC)   Acute on chronic HFrEF: Medication nonadherence precipitated decompensation. EF known to be 15%. Previously on torsemide 60mg  daily, switched this morning to 40mg  po BID per Dr. Swaziland. Has refused ICD. BNP elevated, no pulmonary edema on CXR. - Continue lasix 40mg  IV BID for now.  - Check daily weights, strict I/O. DC weight June 2020 was 191lbs, currently ~195lbs.  - Continue guideline-directed therapy (dizziness limited tolerance of entresto) including coreg, spironolactone, imdur, and hydralazine.  - Cardiology, Dr. Mayford Knife,  consulted by EDP but stated hospitalists should admit and call cardiology if having difficulty diuresing.  - Consider palliative care consult due to frequent CHF exacerbations and nonadherence.   Ascites: Without clinical suspicion for SBP. This appears to already be improving after single dose of IV lasix. - If not improving with diuresis, could consider paracentesis.   CAD, ICM: No current chest pain. Troponin neg. - Continue brilinta (hx in-stent thrombosis due to nonadherence), beta blocker, statin.   HTN:  - Continue home medications  HLD:  - Continue statin  Stage IIIb CKD:  - Continue monitor metabolic panel with diuresis.  - Avoid nephrotoxins.   Iron deficiency anemia: Hgb wnl - Continue home supplement  DVT prophylaxis: Lovenox  Code Status: Full  Family Communication: None at bedside Disposition Plan: Home once improved Consults called: Cardiology by EDP. They stated they would require a call back if formal consult was needed.  Admission status: Observation.   Date of Service: 10/11/2019  Tyrone Nine, MD [facility] [email or web address] 10/11/2019, 10:45 PM

## 2019-10-11 NOTE — Patient Instructions (Signed)
Continue your current therapy  We will add furosemide 40 mg twice a day  Follow up in 4 weeks.

## 2019-10-11 NOTE — ED Notes (Signed)
Pt's pulse ox was 97% RA while ambulating.

## 2019-10-11 NOTE — ED Provider Notes (Signed)
Emergency Department Provider Note   I have reviewed the triage vital signs and the nursing notes.   HISTORY  Chief Complaint Abdominal Pain and Shortness of Breath   HPI Deborah Ho is a 68 y.o. female with PMH of CHF, HTN, HLD, and CAD presents to the emergency department with worsening abdominal distention and shortness of breath.  Patient saw Dr. Swaziland, her cardiologist, this morning.  She states that she has not been taking her torsemide for at least the last week she says because of low BP.  She denies chest pain but notes shortness of breath especially with exertion.  She tells me that she has been compliant with her fluid restriction.  She got in the cab to leave her cardiology appointment and states she was very SOB with walking and didn't think that she could make it home so had the cab drive her to the ED. Dr. Swaziland noted her to be 3 lbs up in the office today.    Past Medical History:  Diagnosis Date  . CAD (coronary artery disease) 05/03/14; 05/09/14   a. anterior STEMI with early in-stent thorombosis for missed dose of Brillinta s/p PCI with DESx 2 into LAD (04/2014)  . CHF (congestive heart failure) (HCC)   . GERD (gastroesophageal reflux disease)    Probable  . HTN (hypertension)   . Hyperlipidemia   . Ischemic cardiomyopathy    a. 04/2014 ECHO with EF 45-50% b.  Repeat 2D echo 08/14/14 with EF down at 15%. Life vest placed  . Non compliance w medication regimen   . Tobacco use     Patient Active Problem List   Diagnosis Date Noted  . Acute on chronic diastolic CHF (congestive heart failure) (HCC) 10/12/2019  . Acute systolic CHF (congestive heart failure) (HCC) 10/11/2019  . Vitamin D deficiency 09/07/2019  . Obesity with body mass index 30 or greater 09/07/2019  . Abdominal pain 12/16/2018  . Acute CHF (congestive heart failure) (HCC) 12/15/2018  . Acute exacerbation of CHF (congestive heart failure) (HCC) 11/28/2018  . Acute on chronic systolic CHF  (congestive heart failure) (HCC) 11/27/2018  . Acute on chronic combined systolic and diastolic CHF (congestive heart failure) (HCC) 04/02/2017  . Noncompliance with medication regimen 04/02/2017  . Intractable nausea and vomiting 02/18/2017  . Hyperlipidemia   . GERD (gastroesophageal reflux disease)   . CHF (congestive heart failure) (HCC)   . Cellulitis of left lower extremity   . Cough productive of clear sputum   . Cellulitis of left lower leg   . Hypokalemia 01/28/2017  . Peripheral edema   . Abdominal distension 06/26/2016  . Anasarca 06/26/2016  . Chronic systolic heart failure (HCC) 02/20/2016  . CKD (chronic kidney disease), stage III   . Adjustment disorder with mixed anxiety and depressed mood 02/28/2015  . Pleural effusion on right   . Essential hypertension   . Angioedema of lips 08/17/2014  . Ischemic cardiomyopathy 08/13/2014  . Coronary atherosclerosis of native coronary artery 05/10/2014  . History of ST elevation myocardial infarction (STEMI) 05/09/2014  . HLD (hyperlipidemia) 05/06/2014  . Tobacco abuse 05/06/2014  . Cardiomyopathy, ischemic 05/06/2014  . Elevated LFTs 05/06/2014    Past Surgical History:  Procedure Laterality Date  . CHOLECYSTECTOMY    . CORONARY ANGIOPLASTY WITH STENT PLACEMENT  05/03/14   STEMI- stent to LAD DES- Xience alpine  . CORONARY ANGIOPLASTY WITH STENT PLACEMENT  05/09/14   STEMI- overlapping stent to LAD, pt had missed a dose of Brilinta  .  LEFT HEART CATHETERIZATION WITH CORONARY ANGIOGRAM N/A 05/03/2014   Procedure: LEFT HEART CATHETERIZATION WITH CORONARY ANGIOGRAM;  Surgeon: Peter M Swaziland, MD;  Location: [facility] CATH LAB;  Service: Cardiovascular;  Laterality: N/A;  . LEFT HEART CATHETERIZATION WITH CORONARY ANGIOGRAM N/A 05/09/2014   Procedure: LEFT HEART CATHETERIZATION WITH CORONARY ANGIOGRAM;  Surgeon: Corky Crafts, MD;  Location: [facility] CATH LAB;  Service: Cardiovascular;  Laterality: N/A;  . PARTIAL HYSTERECTOMY    .  PERCUTANEOUS STENT INTERVENTION  05/03/2014   Procedure: PERCUTANEOUS STENT INTERVENTION;  Surgeon: Peter M Swaziland, MD;  Location: [facility] CATH LAB;  Service: Cardiovascular;;  DES Prox LAD     Allergies Aspirin, Effient [prasugrel], Entresto [sacubitril-valsartan], Lactose intolerance (gi), Robitussin dm [guaifenesin-dm], Sulfa antibiotics, and Other  Family History  Problem Relation Age of Onset  . Diabetes Mother   . Hypertension Mother     Social History Social History   Tobacco Use  . Smoking status: Current Some Day Smoker    Packs/day: 0.10    Years: 0.50    Pack years: 0.05    Types: Cigarettes  . Smokeless tobacco: Current User  . Tobacco comment: down to 3 cigarettes daily (08/03/14)  Substance Use Topics  . Alcohol use: No  . Drug use: No    Review of Systems  Constitutional: No fever/chills Eyes: No visual changes. ENT: No sore throat. Cardiovascular: Denies chest pain. Respiratory: Positive shortness of breath. Gastrointestinal: No abdominal pain but significant distension.  No nausea, no vomiting.  No diarrhea.  No constipation. Genitourinary: Negative for dysuria. Musculoskeletal: Negative for back pain. Skin: Negative for rash. Neurological: Negative for headaches, focal weakness or numbness.  10-point ROS otherwise negative.  ____________________________________________   PHYSICAL EXAM:  VITAL SIGNS: ED Triage Vitals  Enc Vitals Group     BP 10/11/19 1747 (!) 141/98     Pulse Rate 10/11/19 1747 88     Resp 10/11/19 1747 16     Temp 10/11/19 1747 97.7 F (36.5 C)     Temp Source 10/11/19 1747 Oral     SpO2 10/11/19 1747 97 %   Constitutional: Alert and oriented. Well appearing and in no acute distress. Eyes: Conjunctivae are normal.  Head: Atraumatic. Nose: No congestion/rhinnorhea. Mouth/Throat: Mucous membranes are moist.   Neck: No stridor.   Cardiovascular: Normal rate, regular rhythm. Good peripheral circulation. Grossly normal heart  sounds.   Respiratory: Normal respiratory effort.  No retractions. Lungs CTAB. Gastrointestinal: Soft and nontender. Abdomen with pitting edema, distended, and tense.  Musculoskeletal: No lower extremity tenderness nor edema. Neurologic:  Normal speech and language. No gross focal neurologic deficits are appreciated.  Skin:  Skin is warm, dry and intact. No rash noted.  ____________________________________________   LABS (all labs ordered are listed, but only abnormal results are displayed)  Labs Reviewed  BASIC METABOLIC PANEL - Abnormal; Notable for the following components:      Result Value   Chloride 112 (*)    CO2 18 (*)    Glucose, Bld 100 (*)    BUN 24 (*)    Creatinine, Ser 1.54 (*)    GFR calc non Af Amer 34 (*)    GFR calc Af Amer 40 (*)    All other components within normal limits  CBC - Abnormal; Notable for the following components:   RBC 5.24 (*)    HCT 48.5 (*)    RDW 15.7 (*)    All other components within normal limits  BRAIN NATRIURETIC PEPTIDE - Abnormal; Notable  for the following components:   B Natriuretic Peptide 2,307.0 (*)    All other components within normal limits  BASIC METABOLIC PANEL - Abnormal; Notable for the following components:   Sodium 147 (*)    Chloride 113 (*)    BUN 24 (*)    Creatinine, Ser 1.46 (*)    GFR calc non Af Amer 37 (*)    GFR calc Af Amer 42 (*)    All other components within normal limits  BASIC METABOLIC PANEL - Abnormal; Notable for the following components:   BUN 24 (*)    Creatinine, Ser 1.50 (*)    GFR calc non Af Amer 35 (*)    GFR calc Af Amer 41 (*)    All other components within normal limits  TROPONIN I (HIGH SENSITIVITY) - Abnormal; Notable for the following components:   Troponin I (High Sensitivity) 18 (*)    All other components within normal limits  SARS CORONAVIRUS 2 (TAT 6-24 HRS)  POC SARS CORONAVIRUS 2 AG -  ED  TROPONIN I (HIGH SENSITIVITY)   ____________________________________________   EKG   EKG Interpretation  Date/Time:  Wednesday October 11 2019 18:00:56 EST Ventricular Rate:  88 PR Interval:  184 QRS Duration: 76 QT Interval:  396 QTC Calculation: 479 R Axis:   59 Text Interpretation: Normal sinus rhythm Indeterminate axis Low voltage QRS Cannot rule out Anterior infarct , age undetermined Abnormal ECG No STEMI Confirmed by Alona Bene [phone]) on 10/11/2019 8:40:08 PM       ____________________________________________  RADIOLOGY  No results found.  ____________________________________________   PROCEDURES  Procedure(s) performed:   Procedures  Abdominal US exam  Multiple views of the abdomen and pericardium are obtained with a multi-frequency probe.  EMERGENCY DEPARTMENT [place] FAST EXAM  PERFORMED BY: Myself  FINDINGS: Moderate abdominal ascites noted.   LIMITATIONS:  None  INTERPRETATION:  Moderate to large volume ascites.   ____________________________________________   INITIAL IMPRESSION / ASSESSMENT AND PLAN / ED COURSE  Pertinent labs & imaging results that were available during my care of the patient were reviewed by me and considered in my medical decision making (see chart for details).   Patient presents with worsening edema in the abdomen.  She does have pitting edema and a history of retaining fluid primarily in her abdomen with CHF exacerbation.  She saw her cardiologist this morning who plan to restart her medications and changed her from torsemide to furosemide 40 mg twice daily.  Patient became very dyspneic with leaving the office and did not feel that she could return home so called a cab to drive her to the emergency department.  She is not hypoxemic but has become very tachypneic with moving around in the exam room.  Presentation does appear multifactorial as I performed a bedside ultrasound of her abdomen which shows a moderate to large volume ascites.  This was noted in June but not deemed large enough for paracentesis at  that time.  She is not having tenderness in her abdomen, fever, or other concern clinically for SBP. Will send BNP and give IV lasix and reassess after ambulation here in the ED on pulse ox.   BNP elevated. Discussed with Dr. Mayford Knife with Cardiology. Agrees with plan for medicine admit and diuresis.   Discussed patient's case with TR[facility] to request admission. Patient and family (if present) updated with plan. Care transferred to [facility] service.  I reviewed all nursing notes, vitals, pertinent old records, EKGs, labs, imaging (as available).  Would consider LVP as well. Discussed with [facility].  ____________________________________________  FINAL CLINICAL IMPRESSION(S) / ED DIAGNOSES  Final diagnoses:  Acute on chronic systolic congestive heart failure ([place])  Other ascites     MEDICATIONS GIVEN DURING THIS VISIT:  Medications  furosemide (LASIX) injection 40 mg (40 mg Intravenous Given 10/13/19 0856)  ondansetron (ZOFRAN) injection 4 mg (has no administration in time range)  atorvastatin (LIPITOR) tablet 80 mg (80 mg Oral Given 10/12/19 1737)  hydrALAZINE (APRESOLINE) tablet 37.5 mg (37.5 mg Oral Not Given 10/13/19 0857)  isosorbide mononitrate (IMDUR) 24 hr tablet 60 mg (60 mg Oral Given 10/13/19 0856)  nitroGLYCERIN (NITROSTAT) SL tablet 0.4 mg (has no administration in time range)  spironolactone (ALDACTONE) tablet 25 mg (25 mg Oral Given 10/13/19 0856)  ticagrelor (BRILINTA) tablet 60 mg (60 mg Oral Given 10/13/19 0856)  ferrous sulfate tablet 325 mg (325 mg Oral Given 10/13/19 0856)  loratadine (CLARITIN) tablet 10 mg (10 mg Oral Given 10/13/19 0858)  fluticasone (FLONASE) 50 MCG/ACT nasal spray 1 spray (has no administration in time range)  sodium chloride flush (NS) 0.9 % injection 3 mL (3 mLs Intravenous Not Given 10/13/19 0909)  sodium chloride flush (NS) 0.9 % injection 3 mL (has no administration in time range)  0.9 %  sodium chloride infusion (has no administration in time range)   acetaminophen (TYLENOL) tablet 650 mg (has no administration in time range)  enoxaparin (LOVENOX) injection 40 mg (40 mg Subcutaneous Given 10/12/19 1549)  carvedilol (COREG) tablet 3.125 mg (3.125 mg Oral Given 10/13/19 0857)  benzonatate (TESSALON) capsule 200 mg (200 mg Oral Given 10/12/19 2039)  furosemide (LASIX) injection 40 mg (40 mg Intravenous Given 10/11/19 2109)  ondansetron (ZOFRAN) injection 4 mg (4 mg Intravenous Given 10/11/19 2105)  influenza vaccine adjuvanted (FLUAD) injection 0.5 mL (0.5 mLs Intramuscular Given 10/13/19 0906)     Note:  This document was prepared using Dragon voice recognition software and may include unintentional dictation errors.  Nanda Quinton, MD, [facility] Emergency Medicine    Tanja Gift, [facility], MD 10/13/19 [phone]

## 2019-10-11 NOTE — ED Triage Notes (Signed)
Pt reports increased abd swelling over the past day, states she had her cardiology follow up this morning for her CHF and they adjusted her medications. Pt states since her appointment this morning she has had increased swelling in her abd. Abd is very edematous and hard. Pt also c.o sob due to the fluid.

## 2019-10-12 ENCOUNTER — Encounter (HOSPITAL_COMMUNITY): Payer: Self-pay

## 2019-10-12 ENCOUNTER — Other Ambulatory Visit: Payer: Self-pay

## 2019-10-12 DIAGNOSIS — F17210 Nicotine dependence, cigarettes, uncomplicated: Secondary | ICD-10-CM | POA: Diagnosis present

## 2019-10-12 DIAGNOSIS — I130 Hypertensive heart and chronic kidney disease with heart failure and stage 1 through stage 4 chronic kidney disease, or unspecified chronic kidney disease: Secondary | ICD-10-CM | POA: Diagnosis present

## 2019-10-12 DIAGNOSIS — R188 Other ascites: Secondary | ICD-10-CM | POA: Diagnosis present

## 2019-10-12 DIAGNOSIS — I5033 Acute on chronic diastolic (congestive) heart failure: Secondary | ICD-10-CM | POA: Diagnosis present

## 2019-10-12 DIAGNOSIS — Z9111 Patient's noncompliance with dietary regimen: Secondary | ICD-10-CM | POA: Diagnosis not present

## 2019-10-12 DIAGNOSIS — R140 Abdominal distension (gaseous): Secondary | ICD-10-CM | POA: Diagnosis not present

## 2019-10-12 DIAGNOSIS — I5041 Acute combined systolic (congestive) and diastolic (congestive) heart failure: Secondary | ICD-10-CM | POA: Diagnosis not present

## 2019-10-12 DIAGNOSIS — T501X6A Underdosing of loop [high-ceiling] diuretics, initial encounter: Secondary | ICD-10-CM | POA: Diagnosis present

## 2019-10-12 DIAGNOSIS — Z955 Presence of coronary angioplasty implant and graft: Secondary | ICD-10-CM | POA: Diagnosis not present

## 2019-10-12 DIAGNOSIS — Z7902 Long term (current) use of antithrombotics/antiplatelets: Secondary | ICD-10-CM | POA: Diagnosis not present

## 2019-10-12 DIAGNOSIS — E739 Lactose intolerance, unspecified: Secondary | ICD-10-CM | POA: Diagnosis present

## 2019-10-12 DIAGNOSIS — N1831 Chronic kidney disease, stage 3a: Secondary | ICD-10-CM | POA: Diagnosis not present

## 2019-10-12 DIAGNOSIS — I2510 Atherosclerotic heart disease of native coronary artery without angina pectoris: Secondary | ICD-10-CM | POA: Diagnosis present

## 2019-10-12 DIAGNOSIS — I255 Ischemic cardiomyopathy: Secondary | ICD-10-CM | POA: Diagnosis present

## 2019-10-12 DIAGNOSIS — R17 Unspecified jaundice: Secondary | ICD-10-CM | POA: Diagnosis not present

## 2019-10-12 DIAGNOSIS — Z515 Encounter for palliative care: Secondary | ICD-10-CM | POA: Diagnosis not present

## 2019-10-12 DIAGNOSIS — I5043 Acute on chronic combined systolic (congestive) and diastolic (congestive) heart failure: Secondary | ICD-10-CM | POA: Diagnosis present

## 2019-10-12 DIAGNOSIS — Z23 Encounter for immunization: Secondary | ICD-10-CM | POA: Diagnosis present

## 2019-10-12 DIAGNOSIS — E785 Hyperlipidemia, unspecified: Secondary | ICD-10-CM | POA: Diagnosis present

## 2019-10-12 DIAGNOSIS — Z7189 Other specified counseling: Secondary | ICD-10-CM | POA: Diagnosis not present

## 2019-10-12 DIAGNOSIS — Z79899 Other long term (current) drug therapy: Secondary | ICD-10-CM | POA: Diagnosis not present

## 2019-10-12 DIAGNOSIS — N1832 Chronic kidney disease, stage 3b: Secondary | ICD-10-CM | POA: Diagnosis present

## 2019-10-12 DIAGNOSIS — Y92009 Unspecified place in unspecified non-institutional (private) residence as the place of occurrence of the external cause: Secondary | ICD-10-CM | POA: Diagnosis not present

## 2019-10-12 DIAGNOSIS — E876 Hypokalemia: Secondary | ICD-10-CM | POA: Diagnosis present

## 2019-10-12 DIAGNOSIS — Z20828 Contact with and (suspected) exposure to other viral communicable diseases: Secondary | ICD-10-CM | POA: Diagnosis present

## 2019-10-12 DIAGNOSIS — I5023 Acute on chronic systolic (congestive) heart failure: Secondary | ICD-10-CM | POA: Diagnosis not present

## 2019-10-12 DIAGNOSIS — I252 Old myocardial infarction: Secondary | ICD-10-CM | POA: Diagnosis not present

## 2019-10-12 DIAGNOSIS — I10 Essential (primary) hypertension: Secondary | ICD-10-CM | POA: Diagnosis not present

## 2019-10-12 DIAGNOSIS — Z6836 Body mass index (BMI) 36.0-36.9, adult: Secondary | ICD-10-CM | POA: Diagnosis not present

## 2019-10-12 DIAGNOSIS — E669 Obesity, unspecified: Secondary | ICD-10-CM | POA: Diagnosis present

## 2019-10-12 DIAGNOSIS — Z9114 Patient's other noncompliance with medication regimen: Secondary | ICD-10-CM | POA: Diagnosis not present

## 2019-10-12 DIAGNOSIS — D509 Iron deficiency anemia, unspecified: Secondary | ICD-10-CM | POA: Diagnosis present

## 2019-10-12 LAB — BASIC METABOLIC PANEL
Anion gap: 12 (ref 5–15)
BUN/Creatinine Ratio: 18 (ref 12–28)
BUN: 24 mg/dL (ref 8–27)
BUN: 24 mg/dL — ABNORMAL HIGH (ref 8–23)
CO2: 20 mmol/L (ref 20–29)
CO2: 22 mmol/L (ref 22–32)
Calcium: 9.7 mg/dL (ref 8.7–10.3)
Calcium: 9.5 mg/dL (ref 8.9–10.3)
Chloride: 109 mmol/L — ABNORMAL HIGH (ref 96–106)
Chloride: 113 mmol/L — ABNORMAL HIGH (ref 98–111)
Creatinine, Ser: 1.37 mg/dL — ABNORMAL HIGH (ref 0.57–1.00)
Creatinine, Ser: 1.46 mg/dL — ABNORMAL HIGH (ref 0.44–1.00)
GFR calc Af Amer: 46 mL/min/{1.73_m2} — ABNORMAL LOW (ref >59)
GFR calc Af Amer: 42 mL/min — ABNORMAL LOW (ref >60)
GFR calc non Af Amer: 40 mL/min/{1.73_m2} — ABNORMAL LOW (ref >59)
GFR calc non Af Amer: 37 mL/min — ABNORMAL LOW (ref >60)
Glucose, Bld: 93 mg/dL (ref 70–99)
Glucose: 91 mg/dL (ref 65–99)
Potassium: 4.6 mmol/L (ref 3.5–5.2)
Potassium: 3.8 mmol/L (ref 3.5–5.1)
Sodium: 144 mmol/L (ref 134–144)
Sodium: 147 mmol/L — ABNORMAL HIGH (ref 135–145)

## 2019-10-12 LAB — BRAIN NATRIURETIC PEPTIDE: BNP: 1706 pg/mL — ABNORMAL HIGH (ref 0.0–100.0)

## 2019-10-12 LAB — TROPONIN I (HIGH SENSITIVITY): Troponin I (High Sensitivity): 18 ng/L — ABNORMAL HIGH (ref <18)

## 2019-10-12 MED ORDER — HYDRALAZINE HCL 25 MG PO TABS
37.5000 mg | ORAL_TABLET | Freq: Three times a day (TID) | ORAL | Status: DC
  Administered 2019-10-12 – 2019-10-14 (×4): 37.5 mg via ORAL
  Filled 2019-10-12 (×8): qty 2

## 2019-10-12 MED ORDER — ACETAMINOPHEN 325 MG PO TABS
650.0000 mg | ORAL_TABLET | ORAL | Status: DC | PRN
  Administered 2019-10-14 – 2019-10-20 (×9): 650 mg via ORAL
  Filled 2019-10-12 (×9): qty 2

## 2019-10-12 MED ORDER — FUROSEMIDE 10 MG/ML IJ SOLN
40.0000 mg | Freq: Two times a day (BID) | INTRAMUSCULAR | Status: AC
  Administered 2019-10-12 – 2019-10-17 (×12): 40 mg via INTRAVENOUS
  Filled 2019-10-12 (×10): qty 4

## 2019-10-12 MED ORDER — FERROUS SULFATE 325 (65 FE) MG PO TABS
325.0000 mg | ORAL_TABLET | ORAL | Status: DC
  Administered 2019-10-13 – 2019-10-20 (×4): 325 mg via ORAL
  Filled 2019-10-12 (×5): qty 1

## 2019-10-12 MED ORDER — INFLUENZA VAC A&B SA ADJ QUAD 0.5 ML IM PRSY
0.5000 mL | PREFILLED_SYRINGE | INTRAMUSCULAR | Status: AC
  Administered 2019-10-13: 09:00:00 0.5 mL via INTRAMUSCULAR
  Filled 2019-10-12: qty 0.5

## 2019-10-12 MED ORDER — SODIUM CHLORIDE 0.9% FLUSH
3.0000 mL | Freq: Two times a day (BID) | INTRAVENOUS | Status: DC
  Administered 2019-10-12 – 2019-10-20 (×14): 3 mL via INTRAVENOUS

## 2019-10-12 MED ORDER — ONDANSETRON HCL 4 MG/2ML IJ SOLN: 4.0000 mg | Freq: Four times a day (QID) | INTRAMUSCULAR | Status: DC | PRN

## 2019-10-12 MED ORDER — SPIRONOLACTONE 25 MG PO TABS
25.0000 mg | ORAL_TABLET | Freq: Every day | ORAL | Status: DC
  Administered 2019-10-12 – 2019-10-21 (×10): 25 mg via ORAL
  Filled 2019-10-12 (×10): qty 1

## 2019-10-12 MED ORDER — CARVEDILOL 3.125 MG PO TABS
3.1250 mg | ORAL_TABLET | Freq: Two times a day (BID) | ORAL | Status: DC
  Administered 2019-10-12 – 2019-10-21 (×19): 3.125 mg via ORAL
  Filled 2019-10-12 (×19): qty 1

## 2019-10-12 MED ORDER — LORATADINE 10 MG PO TABS
10.0000 mg | ORAL_TABLET | Freq: Every day | ORAL | Status: DC
  Administered 2019-10-12 – 2019-10-21 (×10): 10 mg via ORAL
  Filled 2019-10-12 (×10): qty 1

## 2019-10-12 MED ORDER — BENZONATATE 100 MG PO CAPS
200.0000 mg | ORAL_CAPSULE | Freq: Three times a day (TID) | ORAL | Status: DC | PRN
  Administered 2019-10-12: 200 mg via ORAL
  Filled 2019-10-12: qty 2

## 2019-10-12 MED ORDER — ENOXAPARIN SODIUM 40 MG/0.4ML ~~LOC~~ SOLN
40.0000 mg | SUBCUTANEOUS | Status: DC
  Administered 2019-10-12 – 2019-10-19 (×4): 40 mg via SUBCUTANEOUS
  Filled 2019-10-12 (×8): qty 0.4

## 2019-10-12 MED ORDER — CARVEDILOL 3.125 MG PO TABS: 3.1250 mg | ORAL_TABLET | Freq: Two times a day (BID) | ORAL | Status: DC

## 2019-10-12 MED ORDER — TICAGRELOR 60 MG PO TABS
60.0000 mg | ORAL_TABLET | Freq: Two times a day (BID) | ORAL | Status: DC
  Administered 2019-10-12 – 2019-10-21 (×19): 60 mg via ORAL
  Filled 2019-10-12 (×22): qty 1

## 2019-10-12 MED ORDER — FLUTICASONE PROPIONATE 50 MCG/ACT NA SUSP: 1.0000 | Freq: Every day | NASAL | Status: DC | PRN

## 2019-10-12 MED ORDER — ISOSORBIDE MONONITRATE ER 60 MG PO TB24
60.0000 mg | ORAL_TABLET | Freq: Every day | ORAL | Status: DC
  Administered 2019-10-12 – 2019-10-21 (×10): 60 mg via ORAL
  Filled 2019-10-12 (×10): qty 1

## 2019-10-12 MED ORDER — SODIUM CHLORIDE 0.9 % IV SOLN: 250.0000 mL | INTRAVENOUS | Status: DC | PRN

## 2019-10-12 MED ORDER — NITROGLYCERIN 0.4 MG SL SUBL: 0.4000 mg | SUBLINGUAL_TABLET | SUBLINGUAL | Status: DC | PRN

## 2019-10-12 MED ORDER — ATORVASTATIN CALCIUM 80 MG PO TABS
80.0000 mg | ORAL_TABLET | Freq: Every day | ORAL | Status: DC
  Administered 2019-10-12 – 2019-10-20 (×9): 80 mg via ORAL
  Filled 2019-10-12 (×9): qty 1

## 2019-10-12 MED ORDER — SODIUM CHLORIDE 0.9% FLUSH
3.0000 mL | INTRAVENOUS | Status: DC | PRN
  Administered 2019-10-21: 3 mL via INTRAVENOUS
  Filled 2019-10-12: qty 3

## 2019-10-12 NOTE — Plan of Care (Signed)
  Problem: Education: Goal: Knowledge of General Education information will improve Description: Including pain rating scale, medication(s)/side effects and non-pharmacologic comfort measures 10/12/2019 2148 by Nelia Shi, RN Outcome: Progressing 10/12/2019 2004 by Nelia Shi, RN Outcome: Progressing

## 2019-10-12 NOTE — Progress Notes (Signed)
Progress Note    Deborah Ho  UQJ:[id number] DOB: 26-Sep-1951  DOA: 10/11/2019 PCP: [facility]    Brief Narrative:     Medical records reviewed and are as summarized below:  Deborah Ho is an 68 y.o. female with a history of chronic HFrEF (EF 15%), HTN, HLD, CAD s/p DESx2 2015, ICM, stage IIIb CKD, and tobacco use   She had a routine follow up with cardiology this morning where she reported some shortness of breath, fluctuating weights with abdominal swelling, and recent nonadherence to torsemide. She states torsemide "made me feel like there was another person inside of me" and that she couldn't tolerate it, so didn't take it for about a month. This was replaced with lasix, sent to her pharmacy at that visit, and she went home. She did not take lasix yet, but due to increasing abdominal girth and worsening shortness of breath/inability to take deep breaths she called a taxi and presented to the ED. She was noted to be 4 lbs up from previous EDW with significant abdominal distention confirmed to have moderate ascites per EDP bedside U/S. She remained severely dyspneic limiting mobility so cardiology was contacted and recommended hospitalist admission for diuresis. Lasix IV was given and the patient has already voided many times and reports significant improvement in abdominal swelling, still moderately short of breath with exertion.   Assessment/Plan:   Principal Problem:   Acute on chronic systolic CHF (congestive heart failure) (HCC) Active Problems:   HLD (hyperlipidemia)   Cardiomyopathy, ischemic   Coronary atherosclerosis of native coronary artery   Essential hypertension   CKD (chronic kidney disease), stage III   Abdominal distension   Noncompliance with medication regimen   Obesity with body mass index 30 or greater   Acute on chronic HFrEF:  -Medication nonadherence precipitated decompensation. EF known to be 15%. Previously on  torsemide 60mg  daily, switched this morning to 40mg  po BID per Dr. Swaziland. Has refused ICD. BNP elevated - Continue lasix 40mg  IV BID for now as she appears volume overloaded on exam and dyspneic with exertion  - daily weights -strict I/Os - Continue guideline-directed therapy (dizziness limited tolerance of entresto) including coreg, spironolactone, imdur, and hydralazine.  - home in 24-48 hours  CAD, ICM: No current chest pain. Troponin neg. - Continue brilinta (hx in-stent thrombosis due to nonadherence), beta blocker, statin.   HTN:  - Continue home medications  HLD:  - Continue statin  Stage IIIb CKD:  - daily BMP  - Avoid nephrotoxins.   Iron deficiency anemia: Hgb wnl - Continue home supplement  obesity Body mass index is 38.51 kg/m.   Family Communication/Anticipated D/C date and plan/Code Status   DVT prophylaxis: Lovenox ordered. Code Status: Full Code.  Family Communication:  Disposition Plan: home 24-48 more hours- IV lasix to continue   Medical Consultants:    None.     Subjective:   Does not weigh self at home-- says she has 3 scales  Objective:    Vitals:   10/12/19 0026 10/12/19 0448 10/12/19 0731 10/12/19 0853  BP:  130/69 (!) 142/93 128/75  Pulse:  74 75 79  Resp:  20 20   Temp:  97.8 F (36.6 C) 98 F (36.7 C)   TempSrc:  Oral    SpO2:   96%   Weight: 105 kg     Height: 5\' 5"  (1.651 m)       Intake/Output Summary (Last 24 hours) at 10/12/2019 1213  Last data filed at 10/12/2019 0743 Gross per 24 hour  Intake 120 ml  Output 1000 ml  Net -880 ml   Filed Weights   10/12/19 0026  Weight: 105 kg    Exam: On side of bed No LE edema but large abdomen (suspect that is where her fluid is) Not able to lay flat rrr Pleasant and cooperative Poor insight into disease process  Data Reviewed:   I have personally reviewed following labs and imaging studies:  Labs: Labs show the following:   Basic Metabolic Panel:  Recent Labs  Lab 10/11/19 1151 10/11/19 1805 10/12/19 0301  NA 144 145 147*  K 4.6 4.1 3.8  CL 109* 112* 113*  CO2 20 18* 22  GLUCOSE 91 100* 93  BUN 24 24* 24*  CREATININE 1.37* 1.54* 1.46*  CALCIUM 9.7 9.8 9.5   GFR Estimated Creatinine Clearance: 44.4 mL/min (A) (by C-G formula based on SCr of 1.46 mg/dL (H)). Liver Function Tests: No results for input(s): AST, ALT, ALKPHOS, BILITOT, PROT, ALBUMIN in the last 168 hours. No results for input(s): LIPASE, AMYLASE in the last 168 hours. No results for input(s): AMMONIA in the last 168 hours. Coagulation profile No results for input(s): INR, PROTIME in the last 168 hours.  CBC: Recent Labs  Lab 10/11/19 1805  WBC 5.9  HGB 14.6  HCT 48.5*  MCV 92.6  PLT 227   Cardiac Enzymes: No results for input(s): CKTOTAL, CKMB, CKMBINDEX, TROPONINI in the last 168 hours. BNP (last 3 results) No results for input(s): PROBNP in the last 8760 hours. CBG: No results for input(s): GLUCAP in the last 168 hours. D-Dimer: No results for input(s): DDIMER in the last 72 hours. Hgb A1c: No results for input(s): HGBA1C in the last 72 hours. Lipid Profile: No results for input(s): CHOL, HDL, LDLCALC, TRIG, CHOLHDL, LDLDIRECT in the last 72 hours. Thyroid function studies: No results for input(s): TSH, T4TOTAL, T3FREE, THYROIDAB in the last 72 hours.  Invalid input(s): FREET3 Anemia work up: No results for input(s): VITAMINB12, FOLATE, FERRITIN, TIBC, IRON, RETICCTPCT in the last 72 hours. Sepsis Labs: Recent Labs  Lab 10/11/19 1805  WBC 5.9    Microbiology No results found for this or any previous visit (from the past 240 hour(s)).  Procedures and diagnostic studies:  Dg Chest 2 View  Result Date: 10/11/2019 CLINICAL DATA:  Shortness of breath. Congestive heart failure. Abdominal pain. EXAM: CHEST - 2 VIEW COMPARISON:  04/19/2019 FINDINGS: Chronic cardiomegaly, unchanged. Pulmonary vascularity is normal. Lungs are clear. No  significant bone abnormality. Aortic atherosclerosis. IMPRESSION: Chronic cardiomegaly.  No pulmonary edema. Aortic Atherosclerosis (ICD10-I70.0). Electronically Signed   By: Francene Boyers M.D.   On: 10/11/2019 18:36    Medications:   . atorvastatin  80 mg Oral q1800  . carvedilol  3.125 mg Oral BID WC  . enoxaparin (LOVENOX) injection  40 mg Subcutaneous Q24H  . [START ON 10/13/2019] ferrous sulfate  325 mg Oral 3 times weekly  . furosemide  40 mg Intravenous BID  . hydrALAZINE  37.5 mg Oral TID  . [START ON 10/13/2019] influenza vaccine adjuvanted  0.5 mL Intramuscular Tomorrow-1000  . isosorbide mononitrate  60 mg Oral Daily  . loratadine  10 mg Oral Daily  . sodium chloride flush  3 mL Intravenous Q12H  . spironolactone  25 mg Oral Daily  . ticagrelor  60 mg Oral BID   Continuous Infusions: . sodium chloride       LOS: 0 days   Shanda Bumps  Alison Stalling  [facility]   How to contact the [facility] Attending or Consulting provider [place] or covering provider during after hours [place], for this patient?  1. Check the care team in [facility] and look for a) attending/consulting [facility] provider listed and b) the [facility] team listed 2. Log into [email or web address] and use [place]'s universal password to access. If you do not have the password, please contact the hospital operator. 3. Locate the [facility] provider you are looking for under [facility] and page to a number that you can be directly reached. 4. If you still have difficulty reaching the provider, please page the [facility] (Director on Call) for the Hospitalists listed on amion for assistance.  10/12/2019, 12:13 PM

## 2019-10-12 NOTE — Plan of Care (Signed)
  Problem: Education: Goal: Knowledge of General Education information will improve Description Including pain rating scale, medication(s)/side effects and non-pharmacologic comfort measures Outcome: Progressing   

## 2019-10-12 NOTE — Progress Notes (Signed)
Pt ambulated in the hallway with nurse tech at room air. Oxygen saturation at 97% RA.  Pt stated feeling short of breath.  Pt is currently in her bed, at room air on 97% oxygen saturation. Call bell within reach.

## 2019-10-12 NOTE — Progress Notes (Signed)
SATURATION QUALIFICATIONS: (This note is used to comply with regulatory documentation for home oxygen)  Patient Saturations on Room Air at Rest = 97%  Patient Saturations on Room Air while Ambulating = 97%  

## 2019-10-13 DIAGNOSIS — I2510 Atherosclerotic heart disease of native coronary artery without angina pectoris: Secondary | ICD-10-CM

## 2019-10-13 DIAGNOSIS — I5023 Acute on chronic systolic (congestive) heart failure: Secondary | ICD-10-CM

## 2019-10-13 DIAGNOSIS — I10 Essential (primary) hypertension: Secondary | ICD-10-CM

## 2019-10-13 DIAGNOSIS — Z9114 Patient's other noncompliance with medication regimen: Secondary | ICD-10-CM

## 2019-10-13 DIAGNOSIS — R188 Other ascites: Secondary | ICD-10-CM

## 2019-10-13 DIAGNOSIS — R140 Abdominal distension (gaseous): Secondary | ICD-10-CM

## 2019-10-13 DIAGNOSIS — I255 Ischemic cardiomyopathy: Secondary | ICD-10-CM

## 2019-10-13 DIAGNOSIS — E7800 Pure hypercholesterolemia, unspecified: Secondary | ICD-10-CM

## 2019-10-13 DIAGNOSIS — N1831 Chronic kidney disease, stage 3a: Secondary | ICD-10-CM

## 2019-10-13 LAB — BASIC METABOLIC PANEL
Anion gap: 15 (ref 5–15)
BUN: 24 mg/dL — ABNORMAL HIGH (ref 8–23)
CO2: 23 mmol/L (ref 22–32)
Calcium: 9.6 mg/dL (ref 8.9–10.3)
Chloride: 107 mmol/L (ref 98–111)
Creatinine, Ser: 1.5 mg/dL — ABNORMAL HIGH (ref 0.44–1.00)
GFR calc Af Amer: 41 mL/min — ABNORMAL LOW (ref >60)
GFR calc non Af Amer: 35 mL/min — ABNORMAL LOW (ref >60)
Glucose, Bld: 87 mg/dL (ref 70–99)
Potassium: 3.7 mmol/L (ref 3.5–5.1)
Sodium: 145 mmol/L (ref 135–145)

## 2019-10-13 LAB — SARS CORONAVIRUS 2 (TAT 6-24 HRS): SARS Coronavirus 2: NEGATIVE

## 2019-10-13 NOTE — Progress Notes (Signed)
PROGRESS NOTE  Deborah Ho ATF:[id number] DOB: 1950/12/16 DOA: 10/11/2019 PCP: [facility]  HPI/Recap of past 24 hours: Deborah Ho is an 68 y.o. female with a history ofchronic HFrEF(EF 15%), HTN, HLD, CAD s/p DESx2 2015, ICM, stage IIIb CKD, tobacco abuse, non-compliance to meds and diet, poor insight to disease, presented to the ED, c/o increasing abdominal girth and worsening shortness of breath/inability to take deep breaths, remained severely dyspneic limiting mobility so recommended hospitalist admission for diuresis.    Today, patient still with significant abdominal distention, short of breath, denies any chest pain, nausea/vomiting/diarrhea, fever/chills, cough.  Cardiology consulted   Assessment/Plan: Principal Problem:   Acute on chronic systolic CHF (congestive heart failure) (HCC) Active Problems:   HLD (hyperlipidemia)   Cardiomyopathy, ischemic   Coronary atherosclerosis of native coronary artery   Essential hypertension   CKD (chronic kidney disease), stage III   Abdominal distension   Noncompliance with medication regimen   Obesity with body mass index 30 or greater   Acute on chronic diastolic CHF (congestive heart failure) (HCC)   Acute on chronic combined systolic and diastolic HF/ischemic cardiomyopathy Patient significantly fluid overloaded, abdominal distention/shortness of breath BNP 2307 Chest x-ray showed chronic cardiomegaly Troponin 17-->18, EKG with no acute ST changes Echo done on 11/2018 showed EF of 27%, grade 3 diastolic dysfunction Cardiology consulted Continue IV Lasix, Coreg, hydralazine, Imdur, spironolactone Strict I's and O's, daily weights, fluid restriction May consider paracentesis if ascites is not significantly improving Telemetry  CAD status post PCI x2 in 2015/HLD Currently chest pain-free Troponin/EKG as above Continue aspirin, Brilinta, Coreg, statin  Hypertension Continue hydralazine,  Imdur, spironolactone  CKD stage III Baseline creatinine around 1.5 Daily BMP as patient is on diuretics  GOC Palliative care consulted       Malnutrition Type:      Malnutrition Characteristics:      Nutrition Interventions:       Estimated body mass index is 37.71 kg/m as calculated from the following:   Height as of this encounter: 5\' 5"  (1.651 m).   Weight as of this encounter: 102.8 kg.     Code Status: Full  Family Communication: None at bedside  Disposition Plan: After significant diuresis   Consultants:  Cardiology  Procedures:  None  Antimicrobials:  None  DVT prophylaxis: Lovenox   Objective: Vitals:   10/13/19 0445 10/13/19 0854 10/13/19 1113 10/13/19 1430  BP: 123/80 118/76 111/71 119/73  Pulse: 77 76 73 72  Resp: 18  20   Temp: (!) 97.4 F (36.3 C)  (!) 97.5 F (36.4 C)   TempSrc: Oral  Oral   SpO2: 96% 96% 97% 99%  Weight:      Height:        Intake/Output Summary (Last 24 hours) at 10/13/2019 1603 Last data filed at 10/13/2019 1523 Gross per 24 hour  Intake 900 ml  Output 3300 ml  Net -2400 ml   Filed Weights   10/12/19 0026 10/13/19 0442  Weight: 105 kg 102.8 kg    Exam:  General: NAD, chronically ill-appearing  Cardiovascular: S1, S2 present  Respiratory:  Diminished breath sounds at the bases  Abdomen: Soft, nontender, tense/+++distended, bowel sounds present  Musculoskeletal: 1+ bilateral pedal edema noted  Skin: Normal  Psychiatry: Normal mood   Data Reviewed: CBC: Recent Labs  Lab 10/11/19 1805  WBC 5.9  HGB 14.6  HCT 48.5*  MCV 92.6  PLT 062   Basic Metabolic Panel: Recent Labs  Lab  10/11/19 1151 10/11/19 1805 10/12/19 0301 10/13/19 0406  NA 144 145 147* 145  K 4.6 4.1 3.8 3.7  CL 109* 112* 113* 107  CO2 20 18* 22 23  GLUCOSE 91 100* 93 87  BUN 24 24* 24* 24*  CREATININE 1.37* 1.54* 1.46* 1.50*  CALCIUM 9.7 9.8 9.5 9.6   GFR: Estimated Creatinine Clearance: 42.7  mL/min (A) (by C-G formula based on SCr of 1.5 mg/dL (H)). Liver Function Tests: No results for input(s): AST, ALT, ALKPHOS, BILITOT, PROT, ALBUMIN in the last 168 hours. No results for input(s): LIPASE, AMYLASE in the last 168 hours. No results for input(s): AMMONIA in the last 168 hours. Coagulation Profile: No results for input(s): INR, PROTIME in the last 168 hours. Cardiac Enzymes: No results for input(s): CKTOTAL, CKMB, CKMBINDEX, TROPONINI in the last 168 hours. BNP (last 3 results) No results for input(s): PROBNP in the last 8760 hours. HbA1C: No results for input(s): HGBA1C in the last 72 hours. CBG: No results for input(s): GLUCAP in the last 168 hours. Lipid Profile: No results for input(s): CHOL, HDL, LDLCALC, TRIG, CHOLHDL, LDLDIRECT in the last 72 hours. Thyroid Function Tests: No results for input(s): TSH, T4TOTAL, FREET4, T3FREE, THYROIDAB in the last 72 hours. Anemia Panel: No results for input(s): VITAMINB12, FOLATE, FERRITIN, TIBC, IRON, RETICCTPCT in the last 72 hours. Urine analysis:    Component Value Date/Time   COLORURINE YELLOW 12/16/2018 0024   APPEARANCEUR CLEAR 12/16/2018 0024   LABSPEC 1.034 (H) 12/16/2018 0024   PHURINE 5.0 12/16/2018 0024   GLUCOSEU NEGATIVE 12/16/2018 0024   HGBUR NEGATIVE 12/16/2018 0024   BILIRUBINUR NEGATIVE 12/16/2018 0024   KETONESUR NEGATIVE 12/16/2018 0024   PROTEINUR 100 (A) 12/16/2018 0024   UROBILINOGEN 1.0 07/25/2015 1601   NITRITE NEGATIVE 12/16/2018 0024   LEUKOCYTESUR NEGATIVE 12/16/2018 0024   Sepsis Labs: @LABRCNTIP (procalcitonin:4,lacticidven:4)  ) Recent Results (from the past 240 hour(s))  SARS CORONAVIRUS 2 (TAT 6-24 HRS) Nasopharyngeal Nasopharyngeal Swab     Status: None   Collection Time: 10/13/19  7:20 AM   Specimen: Nasopharyngeal Swab  Result Value Ref Range Status   SARS Coronavirus 2 NEGATIVE NEGATIVE Final    Comment: (NOTE) SARS-CoV-2 target nucleic acids are NOT DETECTED. The SARS-CoV-2  RNA is generally detectable in upper and lower respiratory specimens during the acute phase of infection. Negative results do not preclude SARS-CoV-2 infection, do not rule out co-infections with other pathogens, and should not be used as the sole basis for treatment or other patient management decisions. Negative results must be combined with clinical observations, patient history, and epidemiological information. The expected result is Negative. Fact Sheet for Patients: HairSlick.no Fact Sheet for Healthcare Providers: quierodirigir.com This test is not yet approved or cleared by the [place] FDA and  has been authorized for detection and/or diagnosis of SARS-CoV-2 by FDA under an Emergency Use Authorization (EUA). This EUA will remain  in effect (meaning this test can be used) for the duration of the COVID-19 declaration under Section 56 4(b)(1) of the Act, 21 U.S.C. section 360bbb-3(b)(1), unless the authorization is terminated or revoked sooner. Performed at [facility], [address], [place], [place] [address]       Studies: No results found.  Scheduled Meds: . atorvastatin  80 mg Oral q1800  . carvedilol  3.125 mg Oral BID WC  . enoxaparin (LOVENOX) injection  40 mg Subcutaneous Q24H  . ferrous sulfate  325 mg Oral 3 times weekly  . furosemide  40 mg  Intravenous BID  . hydrALAZINE  37.5 mg Oral TID  . isosorbide mononitrate  60 mg Oral Daily  . loratadine  10 mg Oral Daily  . sodium chloride flush  3 mL Intravenous Q12H  . spironolactone  25 mg Oral Daily  . ticagrelor  60 mg Oral BID    Continuous Infusions: . sodium chloride       LOS: 1 day     Briant Cedar, MD [facility]  If 7PM-7AM, please contact night-coverage [email or web address] 10/13/2019, 4:03 PM

## 2019-10-13 NOTE — Progress Notes (Signed)
Nutrition Education Note  RD consulted for nutrition education regarding CHF.  Pt has been educated by inpatient RD multiple times within the past year.   Spoke with pt over phone, who reports that she is frustrated with her eating habits. She reports that she tries to monitor what she eats, however, feels like she continues to retain fluid despite her efforts and feels extremely discouraged and "has given up". She reports she consumes 3 meals per day (Breakfast: bacon, eggs, coffee, and donut/cinnamon roll; Lunch: open faced sandwich with deli meat and mayonnaise, chips or fries, Dinner: meat, starch, and vegetable). Conversation was ended prematurely, as pt reports that RN had entered room and requested RD call back at a later time.   RD provided "Low Sodium Nutrition Therapy" handout from the Academy of Nutrition and Dietetics. Handout attached to AVS/ discharge summary.   Body mass index is 37.71 kg/m. Pt meets criteria for obesity, class II based on current BMI.  Current diet order is renal/ carb modified with 1.5 L fluid restricton, patient is consuming approximately 50-85% of meals at this time. Labs and medications reviewed. No further nutrition interventions warranted at this time. RD contact information provided. If additional nutrition issues arise, please re-consult RD.   Ivon Oelkers A. Jimmye Norman, RD, LDN, [place] Registered Dietitian II Certified Diabetes Care and Education Specialist Pager: [phone] After hours Pager: [phone]

## 2019-10-13 NOTE — Consult Note (Signed)
Cardiology Consultation:   Patient ID: Deborah Ho MRN: [id number]; DOB: May 05, 1951  Admit date: 10/11/2019 Date of Consult: 10/13/2019  Primary Care Provider: [facility] Primary Cardiologist: Peter Swaziland, MD  Primary Electrophysiologist:  None    Patient Profile:   Deborah Ho is a 68 y.o. female with a hx of chronic combined systolic and diastolic Heart Failure (EF 18%), refused ICD, CAD with anterior STEMI due to early stent thrombosis for missing Brilinta and had PCIx2 to LAD 2015, ishcemic cardiomyopathy, morbid obesity, noncompliance, HTN, and HLD who is being seen today for the evaluation of abdominal pain and shortness of breath at the request of Dr. Sharolyn Douglas.  History of Present Illness:   [name] has a history of anterior STEMI 05/10/2014 with early in-stent thrombosis for missed dose of Brilinta which was treated with 2 DES to the LAD on 05/09/2014. Echo obtained on 05/10/2014 showed EF 45 to 50%. Repeat echo 08/14/2014 showed EF 15%, grade 3DD. EF improved to 25% on September 2016. Since then her EF has stayed around 15-20% on multiple echos.  The patient has had multiple hospitalizations for Heart Failure this year. Her last admission was 6/4 - 6/11 and was diuresed with lasix and metolazone 45 lbs. She was discharged on torsemide 60 mg daily. Spiro 25 mg and coreg 3.125 mg BID were continued. She was placed on Bidil was switched to Hydralazine 37.5 mg TID and Imdur 60 daily. Creatinine was elevated. Reported Aspirin allergy. Brilinta 60 mg daily continued. Her last echo was 11/2018 EF 15%, grade 3DD, moderate MR, RV mild systolic dysfunction, severely dilated RA, moderate TR. She has missed many HF appointments and has since been released from HF service due to non-compliance.  The patient was seen in the office by Dr. Swaziland 10/11/19 for follow up. She reported taking all her meds but torsemide because it made her sick. Weight was up 3 lbs.  She also reported worsening shortness of breath. She was started on lasix 40 mg BID.  Later that day the patient went to the ED for shortness of breath and abdominal swelling. She had just left the cardiology appointment when she decided to go to the ED for her shortness of breath. BP 141/98, pulse 88, afebrile, 97% O2, 16 RR. Labs showed potassium 3.7, BUN 2, creatinine 1.50. WBC 5.9, Hgb 14.6. Hs troponin 17 >18. BNP 2,307. EKG with no new changes. CXR was unremarkable. The patient was started on IV lasix and admitted.   Weight on admission was 231.4 lbs (patietn reported dry weight of 190 lbs). She denies chest pain. All of her swelling appears to be in the abdomen. She denies chest pain. She admits to drinking lots of water every day as well as eating out weekly and using salt on her food.   Heart Pathway Score:     Past Medical History:  Diagnosis Date  . CAD (coronary artery disease) 05/03/14; 05/09/14   a. anterior STEMI with early in-stent thorombosis for missed dose of Brillinta s/p PCI with DESx 2 into LAD (04/2014)  . CHF (congestive heart failure) (HCC)   . GERD (gastroesophageal reflux disease)    Probable  . HTN (hypertension)   . Hyperlipidemia   . Ischemic cardiomyopathy    a. 04/2014 ECHO with EF 45-50% b.  Repeat 2D echo 08/14/14 with EF down at 15%. Life vest placed  . Non compliance w medication regimen   . Tobacco use     Past Surgical History:  Procedure Laterality Date  . CHOLECYSTECTOMY    . CORONARY ANGIOPLASTY WITH STENT PLACEMENT  05/03/14   STEMI- stent to LAD DES- Xience alpine  . CORONARY ANGIOPLASTY WITH STENT PLACEMENT  05/09/14   STEMI- overlapping stent to LAD, pt had missed a dose of Brilinta  . LEFT HEART CATHETERIZATION WITH CORONARY ANGIOGRAM N/A 05/03/2014   Procedure: LEFT HEART CATHETERIZATION WITH CORONARY ANGIOGRAM;  Surgeon: Peter M SwazilandJordan, MD;  [facility];  Service: Cardiovascular;  Laterality: N/A;  . LEFT HEART CATHETERIZATION WITH  CORONARY ANGIOGRAM N/A 05/09/2014   Procedure: LEFT HEART CATHETERIZATION WITH CORONARY ANGIOGRAM;  Surgeon: Corky CraftsJayadeep S Varanasi, MD;  [facility];  Service: Cardiovascular;  Laterality: N/A;  . PARTIAL HYSTERECTOMY    . PERCUTANEOUS STENT INTERVENTION  05/03/2014   Procedure: PERCUTANEOUS STENT INTERVENTION;  Surgeon: Peter M SwazilandJordan, MD;  [facility];  Service: Cardiovascular;;  DES Prox LAD      Home Medications:  Prior to Admission medications   Medication Sig Start Date End Date Taking? Authorizing Provider  atorvastatin (LIPITOR) 80 MG tablet Take 80 mg by mouth daily. 04/28/19  Yes [provider]  carboxymethylcellulose (REFRESH PLUS) 0.5 % SOLN Place 1 drop into both eyes 3 (three) times daily as needed (dry eyes).   Yes [provider]  carvedilol (COREG) 3.125 MG tablet Take 1 tablet (3.125 mg total) by mouth 2 (two) times daily with a meal. 01/18/19  Yes Gherghe, Daylene Katayamaostin M, MD  cetirizine (ZYRTEC) 10 MG tablet Take 10 mg daily by mouth.    Yes [provider]  cholecalciferol (VITAMIN D) 1000 units tablet Take 1,000 Units by mouth every 14 (fourteen) days. Twice a month   Yes [provider]  Ferrous Sulfate (IRON PO) Take 1 tablet by mouth 3 (three) times a week. Take on MWF   Yes [provider]  fluticasone (FLONASE) 50 MCG/ACT nasal spray Place 1 spray into both nostrils daily as needed for allergies (congestion).  04/04/18  Yes [provider]  hydrALAZINE (APRESOLINE) 25 MG tablet Take 1.5 tablets (37.5 mg total) by mouth 3 (three) times daily. 04/28/19  Yes Clegg, Amy D, NP  magnesium oxide (MAG-OX) 400 (241.3 Mg) MG tablet Take 400 mg by mouth every 14 (fourteen) days. Twice a month   Yes [provider]  nitroGLYCERIN (NITROSTAT) 0.4 MG SL tablet Place 0.4 mg under the tongue every 5 (five) minutes as needed for chest pain.   Yes [provider]  potassium chloride SA (K-DUR,KLOR-CON) 20 MEQ  tablet Take 1 tablet (20 mEq total) by mouth daily. 12/04/18  Yes Alwyn RenMathews, Elizabeth G, MD  furosemide (LASIX) 40 MG tablet Take 1 tablet (40 mg total) by mouth 2 (two) times daily. 10/11/19 10/05/20  SwazilandJordan, Peter M, MD  isosorbide mononitrate (IMDUR) 30 MG 24 hr tablet Take 2 tablets (60 mg total) by mouth daily. Patient not taking: Reported on 10/12/2019 04/28/19   Tonye Becketlegg, Amy D, NP  spironolactone (ALDACTONE) 25 MG tablet Take 1 tablet (25 mg total) by mouth daily. Patient not taking: Reported on 10/12/2019 04/28/19   Tonye Becketlegg, Amy D, NP  ticagrelor (BRILINTA) 60 MG TABS tablet Take 1 tablet (60 mg total) by mouth 2 (two) times daily. Patient not taking: Reported on 10/12/2019 04/02/17   Dhungel, Theda BelfastNishant, MD    Inpatient Medications: Scheduled Meds: . atorvastatin  80 mg Oral q1800  . carvedilol  3.125 mg Oral BID WC  . enoxaparin (LOVENOX) injection  40 mg  Subcutaneous Q24H  . ferrous sulfate  325 mg Oral 3 times weekly  . furosemide  40 mg Intravenous BID  . hydrALAZINE  37.5 mg Oral TID  . isosorbide mononitrate  60 mg Oral Daily  . loratadine  10 mg Oral Daily  . sodium chloride flush  3 mL Intravenous Q12H  . spironolactone  25 mg Oral Daily  . ticagrelor  60 mg Oral BID   Continuous Infusions: . sodium chloride     PRN Meds: sodium chloride, acetaminophen, benzonatate, fluticasone, nitroGLYCERIN, ondansetron (ZOFRAN) IV, sodium chloride flush  Allergies:    Allergies  Allergen Reactions  . Aspirin Swelling    Chewable children's aspirin makes patients tongue and face swell  . Effient [Prasugrel] Swelling    Patient's tongue and face swells  . Entresto [Sacubitril-Valsartan] Other (See Comments)    dizziness  . Lactose Intolerance (Gi) Other (See Comments)    REACTION: stomach upset  . Robitussin Dm [Guaifenesin-Dm] Swelling    Patient's tongue swells  . Sulfa Antibiotics Swelling  . Other     Had to replace "catgut" with clamps    Social History:   Social History    Socioeconomic History  . Marital status: Widowed    Spouse name: Not on file  . Number of children: Not on file  . Years of education: Not on file  . Highest education level: Not on file  Occupational History  . 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 Financial resource strain: Not on file  . Food insecurity    Worry: Not on file    Inability: Not on file  . Transportation needs    Medical: Not on file    Non-medical: Not on file  Tobacco Use  . Smoking status: Current Some Day Smoker    Packs/day: 0.10    Years: 0.50    Pack years: 0.05    Types: Cigarettes  . Smokeless tobacco: Current User  . Tobacco comment: down to 3 cigarettes daily (08/03/14)  Substance and Sexual Activity  . Alcohol use: No  . Drug use: No  . Sexual activity: Not on file  Lifestyle  . Physical activity    Days per week: Not on file    Minutes per session: Not on file  . Stress: Not on file  Relationships  . Social Herbalist on phone: Not on file    Gets together: Not on file    Attends religious service: Not on file    Active member of club or organization: Not on file    Attends meetings of clubs or organizations: Not on file    Relationship status: Not on file  . Intimate partner violence    Fear of current or ex partner: Not on file    Emotionally abused: Not on file    Physically abused: Not on file    Forced sexual activity: Not on file  Other Topics Concern  . Not on file  Social History Narrative   Patient has 6 brothers and sisters and none have known coronary artery disease. She lives alone in [place], but has several brothers in the area.    Family History:   Family History  Problem Relation Age of Onset  . Diabetes Mother   . Hypertension Mother      ROS:  Please see the history of present illness.  All other ROS reviewed and negative.     Physical Exam/Data:   Vitals:   10/13/19 1937  10/13/19 0445 10/13/19 0854 10/13/19 1113  BP:  123/80 118/76  111/71  Pulse:  77 76 73  Resp:  18  20  Temp:  (!) 97.4 F (36.3 C)  (!) 97.5 F (36.4 C)  TempSrc:  Oral  Oral  SpO2:  96% 96% 97%  Weight: 102.8 kg     Height:        Intake/Output Summary (Last 24 hours) at 10/13/2019 1238 Last data filed at 10/13/2019 1114 Gross per 24 hour  Intake 960 ml  Output 3850 ml  Net -2890 ml   Last 3 Weights 10/13/2019 10/12/2019 10/11/2019  Weight (lbs) 226 lb 9.6 oz 231 lb 6.4 oz 195 lb  Weight (kg) 102.785 kg 104.962 kg 88.451 kg     Body mass index is 37.71 kg/m.  General:  Well nourished, well developed, in no acute distress HEENT: normal Lymph: no adenopathy Neck: no JVD Endocrine:  No thryomegaly Vascular: No carotid bruits; FA pulses 2+ bilaterally without bruits  Cardiac:  normal S1, S2; RRR; no murmur  Lungs:  clear to auscultation bilaterally, no wheezing, rhonchi or rales  Abd: very firm, nontender, no hepatomegaly  Ext: no edema Musculoskeletal:  No deformities, BUE and BLE strength normal and equal Skin: warm and dry  Neuro:  CNs 2-12 intact, no focal abnormalities noted Psych:  Normal affect   EKG:  The EKG was personally reviewed and demonstrates:  NSR 88 bpm, poor wave progression, Qtc 479 ms, low voltage  Relevant CV Studies:  Echo 11/28/18 Study Conclusions  - Left ventricle: The cavity size was normal. Wall thickness was   increased in a pattern of mild LVH. The estimated ejection   fraction was 15%. Diffuse hypokinesis. No mural thrombus by   Definity contrast. Doppler parameters are consistent with   restrictive left ventricular relaxation (grade 3 diastolic   dysfunction). The E/e&' ratio is >20, suggesting markedly elevated   LV filling pressure. - Mitral valve: Mildly thickened leaflets . There was moderate   regurgitation. - Left atrium: The atrium was normal in size. - Right ventricle: The cavity size was moderately dilated. Mild   systolic dysfunction. - Right atrium: Severely dilated. - Tricuspid  valve: There was moderate regurgitation. - Pulmonary arteries: PA peak pressure: 53 mm Hg (S). - Inferior vena cava: The vessel was dilated. The respirophasic   diameter changes were blunted (< 50%), consistent with elevated   central venous pressure.   Laboratory Data:  High Sensitivity Troponin:   Recent Labs  Lab 10/11/19 2023 10/12/19 0001  TROPONINIHS 17 18*     Chemistry Recent Labs  Lab 10/11/19 1805 10/12/19 0301 10/13/19 0406  NA 145 147* 145  K 4.1 3.8 3.7  CL 112* 113* 107  CO2 18* 22 23  GLUCOSE 100* 93 87  BUN 24* 24* 24*  CREATININE 1.54* 1.46* 1.50*  CALCIUM 9.8 9.5 9.6  GFRNONAA 34* 37* 35*  GFRAA 40* 42* 41*  ANIONGAP 15 12 15     No results for input(s): PROT, ALBUMIN, AST, ALT, ALKPHOS, BILITOT in the last 168 hours. Hematology Recent Labs  Lab 10/11/19 1805  WBC 5.9  RBC 5.24*  HGB 14.6  HCT 48.5*  MCV 92.6  MCH 27.9  MCHC 30.1  RDW 15.7*  PLT 227   BNP Recent Labs  Lab 10/11/19 1151 10/11/19 1815  BNP 1,706.0* 2,307.0*    DDimer No results for input(s): DDIMER in the last 168 hours.   Radiology/Studies:  Dg Chest 2 View  Result  Date: 10/11/2019 CLINICAL DATA:  Shortness of breath. Congestive heart failure. Abdominal pain. EXAM: CHEST - 2 VIEW COMPARISON:  04/19/2019 FINDINGS: Chronic cardiomegaly, unchanged. Pulmonary vascularity is normal. Lungs are clear. No significant bone abnormality. Aortic atherosclerosis. IMPRESSION: Chronic cardiomegaly.  No pulmonary edema. Aortic Atherosclerosis (ICD10-I70.0). Electronically Signed   By: Francene Boyers M.D.   On: 10/11/2019 18:36    Assessment and Plan:   Acute on Chronic combined systolic and diastolic Heart Failure/Ischemic cardiomyopathy Patient presented with abdominal fullness and shortness of breath. BNP 2,307. CXR with no acute changes. EKG with no new changes. She was admitted on IV lasix. H/o of non-compliance and recent release from HF services. - Patient admits to  stopping her torsemide because it was making her feel bad as well as salt intake and high fluid intake - Last echo 11/2018 EF 15%, G3DD - Since admission -3.9L. Weight down 226 > 231 - Abdomen is still very firm - Continue with diuresis IV 40 mg BID - Creatinine stable>> follow - Will order diet consult for HF/ low salt diet - Fluid restriction 1500cc - continue BB, spiro - Patient will likely need Lasix as outpatient for volume management  CAD s/p PCI x 2 LAD 2015 - No active chest pain - HS troponin 17 > 18 - continue ASA and Brilinta - continue BB and statin  HTN - continue spiro, Imdur 50 mg daily, hydralazine 37.5 mg daily  HLD - continue statin  CKD stage III - on admission was 1.46. baseline around 1.5 - continue to follow   For questions or updates, please contact [facility] Please consult [email or web address] for contact info under     Signed, Johnette Teigen David Stall, PA-C  10/13/2019 12:38 PM

## 2019-10-13 NOTE — Discharge Instructions (Signed)
Low Sodium Nutrition Therapy  °Eating less sodium can help you if you have high blood pressure, heart failure, or kidney or liver disease.  ° °Your body needs a little sodium, but too much sodium can cause your body to hold onto extra water. This extra water will raise your blood pressure and can cause damage to your heart, kidneys, or liver as they are forced to work harder.  ° °Sometimes you can see how the extra fluid affects you because your hands, legs, or belly swell. You may also hold water around your heart and lungs, which makes it hard to breathe.  ° °Even if you take medication for blood pressure or a water pill (diuretic) to remove fluid, it is still important to have less salt in your diet.  ° °Check with your primary care provider before drinking alcohol since it may affect the amount of fluid in your body and how your heart, kidneys, or liver work. °Sodium in Food °A low-sodium meal plan limits the sodium that you get from food and beverages to 1,500-2,000 milligrams (mg) per day. Salt is the main source of sodium. Read the nutrition label on the package to find out how much sodium is in one serving of a food.  °• Select foods with 140 milligrams (mg) of sodium or less per serving.  °• You may be able to eat one or two servings of foods with a little more than 140 milligrams (mg) of sodium if you are closely watching how much sodium you eat in a day.  °• Check the serving size on the label. The amount of sodium listed on the label shows the amount in one serving of the food. So, if you eat more than one serving, you will get more sodium than the amount listed. ° °Tips °Cutting Back on Sodium °• Eat more fresh foods.  °• Fresh fruits and vegetables are low in sodium, as well as frozen vegetables and fruits that have no added juices or sauces.  °• Fresh meats are lower in sodium than processed meats, such as bacon, sausage, and hotdogs.  °• Not all processed foods are unhealthy, but some processed foods  may have too much sodium.  °• Eat less salt at the table and when cooking. One of the ingredients in salt is sodium.  °• One teaspoon of table salt has 2,300 milligrams of sodium.  °• Leave the salt out of recipes for pasta, casseroles, and soups. °• Be a smart shopper.  °• Food packages that say “Salt-free”, sodium-free”, “very low sodium,” and “low sodium” have less than 140 milligrams of sodium per serving.  °• Beware of products identified as “Unsalted,” “No Salt Added,” “Reduced Sodium,” or “Lower Sodium.” These items may still be high in sodium. You should always check the nutrition label. °• Add flavors to your food without adding sodium.  °• Try lemon juice, lime juice, or vinegar.  °• Dry or fresh herbs add flavor.  °• Buy a sodium-free seasoning blend or make your own at home. °• You can purchase salt-free or sodium-free condiments like barbeque sauce in stores and online. Ask your registered dietitian nutritionist for recommendations and where to find them.  °•  °Eating in Restaurants °• Choose foods carefully when you eat outside your home. Restaurant foods can be very high in sodium. Many restaurants provide nutrition facts on their menus or their websites. If you cannot find that information, ask your server. Let your server know that you want your food   to be cooked without salt and that you would like your salad dressing and sauces to be served on the side.  °•  ° °• Foods Recommended °• Food Group • Foods Recommended  °• Grains • Bread, bagels, rolls without salted tops °Homemade bread made with reduced-sodium baking powder °Cold cereals, especially shredded wheat and puffed rice °Oats, grits, or cream of wheat °Pastas, quinoa, and rice °Popcorn, pretzels or crackers without salt °Corn tortillas  °• Protein Foods • Fresh meats and fish; turkey bacon (check the nutrition labels - make sure they are not packaged in a sodium solution) °Canned or packed tuna (no more than 4 ounces at 1 serving) °Beans  and peas °Soybeans) and tofu °Eggs °Nuts or nut butters without salt  °• Dairy • Milk or milk powder °Plant milks, such as rice and soy °Yogurt, including Greek yogurt °Small amounts of natural cheese (blocks of cheese) or reduced-sodium cheese can be used in moderation. (Swiss, ricotta, and fresh mozzarella cheese are lower in sodium than the others) °Cream Cheese °Low sodium cottage cheese  °• Vegetables • Fresh and frozen vegetables without added sauces or salt °Homemade soups (without salt) °Low-sodium, salt-free or sodium-free canned vegetables and soups  °• Fruit • Fresh and canned fruits °Dried fruits, such as raisins, cranberries, and prunes  °• Oils • Tub or liquid margarine, regular or without salt °Canola, corn, peanut, olive, safflower, or sunflower oils  °• Condiments • Fresh or dried herbs such as basil, bay leaf, dill, mustard (dry), nutmeg, paprika, parsley, rosemary, sage, or thyme.  °Low sodium ketchup °Vinegar  °Lemon or lime juice °Pepper, red pepper flakes, and cayenne. °Hot sauce contains sodium, but if you use just a drop or two, it will not add up to much.  °Salt-free or sodium-free seasoning mixes and marinades °Simple salad dressings: vinegar and oil  °•  °• Foods Not Recommended °• Food Group • Foods Not Recommended  °• Grains • Breads or crackers topped with salt °Cereals (hot/cold) with more than 300 mg sodium per serving °Biscuits, cornbread, and other “quick” breads prepared with baking soda °Pre-packaged bread crumbs °Seasoned and packaged rice and pasta mixes °Self-rising flours  °• Protein Foods • Cured meats: Bacon, ham, sausage, pepperoni and hot dogs °Canned meats (chili, vienna sausage, or sardines) °Smoked fish and meats °Frozen meals that have more than 600 mg of sodium per serving °Egg substitute (with added sodium)  °• Dairy • Buttermilk °Processed cheese spreads °Cottage cheese (1 cup may have over 500 mg of sodium; look for low-sodium.) °American or feta cheese °Shredded  Cheese has more sodium than blocks of cheese °String cheese  °• Vegetables • Canned vegetables (unless they are salt-free, sodium-free or low sodium) °Frozen vegetables with seasoning and sauces °Sauerkraut and pickled vegetables °Canned or dried soups (unless they are salt-free, sodium-free, or low sodium) °French fries and onion rings  °• Fruit • Dried fruits preserved with additives that have sodium  °• Oils • Salted butter or margarine, all types of olives  °• Condiments • Salt, sea salt, kosher salt, onion salt, and garlic salt °Seasoning mixes with salt °Bouillon cubes °Ketchup °Barbeque sauce and Worcestershire sauce unless low sodium °Soy sauce °Salsa, pickles, olives, relish °Salad dressings: ranch, blue cheese, Italian, and French.  °•  °• Low Sodium Sample 1-Day Menu  °• Breakfast • 1 cup cooked oatmeal  °• 1 slice whole wheat bread toast  °• 1 tablespoon peanut butter without salt  °• 1 banana  °•   1 cup 1% milk  °• Lunch • Tacos made with: 2 corn tortillas  °• ¼ cup black beans, low sodium  °• ½ cup roasted or grilled chicken (without skin)  °• ¼ avocado  °• Squeeze of lime juice  °• 1 cup salad greens  °• 1 tablespoon low-sodium salad dressing  °• ¼ cup strawberries  °• 1 orange  °• Afternoon Snack • 1/3 cup grapes  °• 6 ounces yogurt  °• Evening Meal • 3 ounces herb-baked fish  °• 1 baked potato  °• 2 teaspoons olive oil  °• ½ cup cooked carrots  °• 2 thick slices tomatoes on:  °• 2 lettuce leaves  °• 1 teaspoon olive oil  °• 1 teaspoon balsamic vinegar  °• 1 cup 1% milk  °• Evening Snack • 1 apple  °• ¼ cup almonds without salt  °•  °• Low-Sodium Vegetarian (Lacto-Ovo) Sample 1-Day Menu  °• Breakfast • 1 cup cooked oatmeal  °• 1 slice whole wheat toast  °• 1 tablespoon peanut butter without salt  °• 1 banana  °• 1 cup 1% milk  °• Lunch • Tacos made with: 2 corn tortillas  °• ¼ cup black beans, low sodium  °• ½ cup roasted or grilled chicken (without skin)  °• ¼ avocado  °• Squeeze of lime juice  °• 1  cup salad greens  °• 1 tablespoon low-sodium salad dressing  °• ¼ cup strawberries  °• 1 orange  °• Evening Meal • Stir fry made with: ½ cup tofu  °• 1 cup brown rice  °• ½ cup broccoli  °• ½ cup green beans  °• ½ cup peppers  °• ½ tablespoon peanut oil  °• 1 orange  °• 1 cup 1% milk  °• Evening Snack • 4 strips celery  °• 2 tablespoons hummus  °• 1 hard-boiled egg  °•  °• Low-Sodium Vegan Sample 1-Day Menu  °• Breakfast • 1 cup cooked oatmeal  °• 1 tablespoon peanut butter without salt  °• 1 cup blueberries  °• 1 cup soymilk fortified with calcium, vitamin B12, and vitamin D  °• Lunch • 1 small whole wheat pita  °• ½ cup cooked lentils  °• 2 tablespoons hummus  °• 4 carrot sticks  °• 1 medium apple  °• 1 cup soymilk fortified with calcium, vitamin B12, and vitamin D  °• Evening Meal • Stir fry made with: ½ cup tofu  °• 1 cup brown rice  °• ½ cup broccoli  °• ½ cup green beans  °• ½ cup peppers  °• ½ tablespoon peanut oil  °• 1 cup cantaloupe  °• Evening Snack • 1 cup soy yogurt  °• ¼ cup mixed nuts  °• Copyright 2020 © [employer]. All rights reserved °•  °• Sodium Free Flavoring Tips °•  °• When cooking, the following items may be used for flavoring instead of salt or seasonings that contain sodium. °• Remember: A little bit of spice goes a long way! Be careful not to overseason. °• Spice Blend Recipe (makes about ? cup) °• 5 teaspoons onion powder  °• 2½ teaspoons garlic powder  °• 2½ teaspoons paprika  °• 2½ teaspoon dry mustard  °• 1½ teaspoon crushed thyme leaves  °• ½ teaspoon white pepper  °• ¼ teaspoon celery seed °Food Item Flavorings  °Beef Basil, bay leaf, caraway, curry, dill, dry mustard, garlic, grape jelly, green pepper, mace, marjoram, mushrooms (fresh), nutmeg, onion or onion powder, parsley, pepper,   rosemary, sage  °Chicken Basil, cloves, cranberries, mace, mushrooms (fresh), nutmeg, oregano, paprika, parsley, pineapple, saffron, sage, savory, tarragon, thyme, tomato,  turmeric  °Egg Chervil, curry, dill, dry mustard, garlic or garlic powder, green pepper, jelly, mushrooms (fresh), nutmeg, onion powder, paprika, parsley, rosemary, tarragon, tomato  °Fish Basil, bay leaf, chervil, curry, dill, dry mustard, green pepper, lemon juice, marjoram, mushrooms (fresh), paprika, pepper, tarragon, tomato, turmeric  °Lamb Cloves, curry, dill, garlic or garlic powder, mace, mint, mint jelly, onion, oregano, parsley, pineapple, rosemary, tarragon, thyme  °Pork Applesauce, basil, caraway, chives, cloves, garlic or garlic powder, onion or onion powder, rosemary, thyme  °Veal Apricots, basil, bay leaf, currant jelly, curry, ginger, marjoram, mushrooms (fresh), oregano, paprika  °Vegetables Basil, dill, garlic or garlic powder, ginger, lemon juice, mace, marjoram, nutmeg, onion or onion powder, tarragon, tomato, sugar or sugar substitute, salt-free salad dressing, vinegar  °Desserts Allspice, anise, cinnamon, cloves, ginger, mace, nutmeg, vanilla extract, other extracts  ° °Copyright 2020 © [employer]. All rights reserved ° °Fluid Restricted Nutrition Therapy  °You have been prescribed this diet because your condition affects how much fluid you can eat or drink. If your heart, liver, or kidneys aren't working properly, you may not be able to effectively eliminate fluids from the body and this may cause swelling (edema) in the legs, arms, and/or stomach. °Drink no more than 1.5  liters or 50 ounces or 6 1/4 cups of fluid per day.  °• You don't need to stop eating or drinking the same fluids you normally would, but you may need to eat or drink less than usual.  °• Your registered dietitian nutritionist will help you determine the correct amount of fluid to consume during the day °Breakfast Include fluids taken with medications  °Lunch Include fluids taken with medications  °Dinner Include fluids taken with medications  °Bedtime Snack Include fluids taken with medications   ° ° ° °Tips °What Are Fluids? ° °A fluid is anything that is liquid or anything that would melt if left at room temperature. You will need to count these foods and liquids--including any liquid used to take medication--as part of your daily fluid intake. Some examples are: °• Alcohol (drink only with your doctor's permission)  °• Coffee, tea, and other hot beverages  °• Gelatin (Jell-O)  °• Gravy  °• Ice cream, sherbet, sorbet  °• Ice cubes, ice chips  °• Milk, liquid creamer  °• Nutritional supplements  °• Popsicles  °• Vegetable and fruit juices; fluid in canned fruit  °• Watermelon  °• Yogurt  °• Soft drinks, lemonade, limeade  °• Soups  °• Syrup °How Do I Measure My Fluid Intake? °• Record your fluid intake daily.  °• Tip: Every day, each time you eat or drink fluids, pour water in the same amount into an empty container that can hold the same amount of fluids you are allowed daily. This may help you keep track of how much fluid you are taking in throughout the day.  °• To accurately keep track of how much liquid you take in, measure the size of the cups, glasses, and bowls you use. If you eat soup, measure how much of it is liquid and how much is solid (such as noodles, vegetables, meat). °Conversions for Measuring Fluid Intake  °Milliliters (mL) Liters (L) Ounces (oz) Cups (c)  °1000 1 32 4  °1200 1.2 40 5  °1500 1.5 50 6 1/4  °1800 1.8 60 7 1/2  °2000 2 67   8 1/3  °Tips to Reduce Your Thirst °• Chew gum or suck on hard candy.  °• Rinse or gargle with mouthwash. Do not swallow.  °• Ice chips or popsicles my help quench thirst, but this too needs to be calculated into the total restriction. Melt ice chips or cubes first to figure out how much fluid they produce (for example, experiment with melting ½ cup ice chips or 2 ice cubes).  °• Add a lemon wedge to your water.  °• Limit how much salt you take in. A high salt intake might make you thirstier.  °• Don't eat or drink all your allowed liquids at once. Space  your liquids out through the day.  °• Use small glasses and cups and sip slowly. If allowed, take your medications with fluids you eat or drink during a meal.  ° °Fluid-Restricted Nutrition Therapy Sample 1-Day Menu  °Breakfast 1 slice wheat toast  °1 tablespoon peanut butter  °1/2 cup yogurt (120 milliliters)  °1/2 cup blueberries  °1 cup milk (240 milliliters)   °Lunch 3 ounces sliced turkey  °2 slices whole wheat bread  °1/2 cup lettuce for sandwich  °2 slices tomato for sandwich  °1 ounce reduced-fat, reduced-sodium cheese  °1/2 cup fresh carrot sticks  °1 banana  °1 cup unsweetened tea (240 milliliters)   °Evening Meal 8 ounces soup (240 milliliters)  °3 ounces salmon  °1/2 cup quinoa  °1 cup green beans  °1 cup mixed greens salad  °1 tablespoon olive oil  °1 cup coffee (240 milliliters)  °Evening Snack 1/2 cup sliced peaches  °1/2 cup frozen yogurt (120 milliliters)  °1 cup water (240 milliliters)  °Copyright 2020 © [employer]. All rights reserved °

## 2019-10-14 ENCOUNTER — Inpatient Hospital Stay (HOSPITAL_COMMUNITY): Payer: Medicare PPO

## 2019-10-14 LAB — CBC WITH DIFFERENTIAL/PLATELET
Abs Immature Granulocytes: 0.01 10*3/uL (ref 0.00–0.07)
Basophils Absolute: 0.1 10*3/uL (ref 0.0–0.1)
Basophils Relative: 1 %
Eosinophils Absolute: 0.1 10*3/uL (ref 0.0–0.5)
Eosinophils Relative: 3 %
HCT: 43.2 % (ref 36.0–46.0)
Hemoglobin: 13.6 g/dL (ref 12.0–15.0)
Immature Granulocytes: 0 %
Lymphocytes Relative: 8 %
Lymphs Abs: 0.4 10*3/uL — ABNORMAL LOW (ref 0.7–4.0)
MCH: 27.8 pg (ref 26.0–34.0)
MCHC: 31.5 g/dL (ref 30.0–36.0)
MCV: 88.2 fL (ref 80.0–100.0)
Monocytes Absolute: 0.6 10*3/uL (ref 0.1–1.0)
Monocytes Relative: 11 %
Neutro Abs: 4.2 10*3/uL (ref 1.7–7.7)
Neutrophils Relative %: 77 %
Platelets: 179 10*3/uL (ref 150–400)
RBC: 4.9 MIL/uL (ref 3.87–5.11)
RDW: 15.6 % — ABNORMAL HIGH (ref 11.5–15.5)
WBC: 5.4 10*3/uL (ref 4.0–10.5)
nRBC: 0 % (ref 0.0–0.2)

## 2019-10-14 LAB — HEPATIC FUNCTION PANEL
ALT: 12 U/L (ref 0–44)
AST: 18 U/L (ref 15–41)
Albumin: 3.1 g/dL — ABNORMAL LOW (ref 3.5–5.0)
Alkaline Phosphatase: 128 U/L — ABNORMAL HIGH (ref 38–126)
Bilirubin, Direct: 1.1 mg/dL — ABNORMAL HIGH (ref 0.0–0.2)
Indirect Bilirubin: 2.5 mg/dL — ABNORMAL HIGH (ref 0.3–0.9)
Total Bilirubin: 3.6 mg/dL — ABNORMAL HIGH (ref 0.3–1.2)
Total Protein: 6.5 g/dL (ref 6.5–8.1)

## 2019-10-14 LAB — BASIC METABOLIC PANEL
Anion gap: 12 (ref 5–15)
BUN: 24 mg/dL — ABNORMAL HIGH (ref 8–23)
CO2: 28 mmol/L (ref 22–32)
Calcium: 9.4 mg/dL (ref 8.9–10.3)
Chloride: 106 mmol/L (ref 98–111)
Creatinine, Ser: 1.58 mg/dL — ABNORMAL HIGH (ref 0.44–1.00)
GFR calc Af Amer: 39 mL/min — ABNORMAL LOW (ref >60)
GFR calc non Af Amer: 33 mL/min — ABNORMAL LOW (ref >60)
Glucose, Bld: 88 mg/dL (ref 70–99)
Potassium: 4 mmol/L (ref 3.5–5.1)
Sodium: 146 mmol/L — ABNORMAL HIGH (ref 135–145)

## 2019-10-14 LAB — PROTIME-INR
INR: 1.2 (ref 0.8–1.2)
Prothrombin Time: 15.4 seconds — ABNORMAL HIGH (ref 11.4–15.2)

## 2019-10-14 NOTE — Plan of Care (Signed)
Poc progressing.  

## 2019-10-14 NOTE — Progress Notes (Signed)
While giving night time meds, pt spitted it out and wrapped in paper towel. Requested pt to swallow her pills, 2nd time she was hiding it under her tongue, did not swallow. Instructed pt if she doesn't take her pills right she is not going to get better. I watched her swallow her pills. Pt said its the old pills, but I saw her spitting it out from her mouth. Will continue to monitor.

## 2019-10-14 NOTE — Progress Notes (Addendum)
Progress Note  Patient Name: Deborah Ho Date of Encounter: 10/14/2019  Primary Cardiologist: Peter Martinique, MD   Subjective   Still complains of abdominal swelling but thinks it is better.  Still SOB some. .  Denies any CP  Inpatient Medications    Scheduled Meds: . atorvastatin  80 mg Oral q1800  . carvedilol  3.125 mg Oral BID WC  . enoxaparin (LOVENOX) injection  40 mg Subcutaneous Q24H  . ferrous sulfate  325 mg Oral 3 times weekly  . furosemide  40 mg Intravenous BID  . hydrALAZINE  37.5 mg Oral TID  . isosorbide mononitrate  60 mg Oral Daily  . loratadine  10 mg Oral Daily  . sodium chloride flush  3 mL Intravenous Q12H  . spironolactone  25 mg Oral Daily  . ticagrelor  60 mg Oral BID   Continuous Infusions: . sodium chloride     PRN Meds: sodium chloride, acetaminophen, benzonatate, fluticasone, nitroGLYCERIN, ondansetron (ZOFRAN) IV, sodium chloride flush   Vital Signs    Vitals:   10/13/19 2032 10/14/19 0009 10/14/19 0443 10/14/19 0812  BP: 99/61 109/70 120/64 122/61  Pulse: 71 72 74 74  Resp: 20 20 18 20   Temp: 97.6 F (36.4 C) 97.7 F (36.5 C) 97.7 F (36.5 C) (!) 97.4 F (36.3 C)  TempSrc: Oral Oral Oral Oral  SpO2: 97% 97% 99% 99%  Weight:  101.8 kg    Height:        Intake/Output Summary (Last 24 hours) at 10/14/2019 1059 Last data filed at 10/14/2019 0900 Gross per 24 hour  Intake 1020 ml  Output 1850 ml  Net -830 ml   Filed Weights   10/12/19 0026 10/13/19 0442 10/14/19 0009  Weight: 105 kg 102.8 kg 101.8 kg    Telemetry    NSR - Personally Reviewed  ECG    No new EKG to review- Personally Reviewed  Physical Exam   GEN: No acute distress.   Neck: No JVD Cardiac: RRR, no murmurs, rubs, or gallops.  Respiratory: Clear to auscultation bilaterally. GI: abdomen full and firm MS: trace edema; No deformity. Neuro:  Nonfocal  Psych: Normal affect   Labs    Chemistry Recent Labs  Lab 10/12/19 0301 10/13/19 0406  10/14/19 0432 10/14/19 0843  NA 147* 145 146*  --   K 3.8 3.7 4.0  --   CL 113* 107 106  --   CO2 22 23 28   --   GLUCOSE 93 87 88  --   BUN 24* 24* 24*  --   CREATININE 1.46* 1.50* 1.58*  --   CALCIUM 9.5 9.6 9.4  --   PROT  --   --   --  6.5  ALBUMIN  --   --   --  3.1*  AST  --   --   --  18  ALT  --   --   --  12  ALKPHOS  --   --   --  128*  BILITOT  --   --   --  3.6*  GFRNONAA 37* 35* 33*  --   GFRAA 42* 41* 39*  --   ANIONGAP 12 15 12   --      Hematology Recent Labs  Lab 10/11/19 1805 10/14/19 0432  WBC 5.9 5.4  RBC 5.24* 4.90  HGB 14.6 13.6  HCT 48.5* 43.2  MCV 92.6 88.2  MCH 27.9 27.8  MCHC 30.1 31.5  RDW 15.7* 15.6*  PLT 227 179  Cardiac EnzymesNo results for input(s): TROPONINI in the last 168 hours. No results for input(s): TROPIPOC in the last 168 hours.   BNP Recent Labs  Lab 10/11/19 1151 10/11/19 1815  BNP 1,706.0* 2,307.0*     DDimer No results for input(s): DDIMER in the last 168 hours.   Radiology    No results found.  Cardiac Studies   Echo 11/28/18 Study Conclusions  - Left ventricle: The cavity size was normal. Wall thickness was increased in a pattern of mild LVH. The estimated ejection fraction was 15%. Diffuse hypokinesis. No mural thrombus by Definity contrast. Doppler parameters are consistent with restrictive left ventricular relaxation (grade 3 diastolic dysfunction). The E/e&' ratio is >20, suggesting markedly elevated LV filling pressure. - Mitral valve: Mildly thickened leaflets . There was moderate regurgitation. - Left atrium: The atrium was normal in size. - Right ventricle: The cavity size was moderately dilated. Mild systolic dysfunction. - Right atrium: Severely dilated. - Tricuspid valve: There was moderate regurgitation. - Pulmonary arteries: PA peak pressure: 53 mm Hg (S). - Inferior vena cava: The vessel was dilated. The respirophasic diameter changes were blunted (<50%),  consistent with elevated central venous pressure.   Patient Profile     68 y.o. female with a hx of chronic combined systolic and diastolic Heart Failure (EF 18%), refused ICD, CAD with anterior STEMI due to early stent thrombosis for missing Brilinta and had PCIx2 to LAD 2015, ishcemic cardiomyopathy, morbid obesity, noncompliance, HTN, and HLD who is being seen today for the evaluation of abdominal pain and shortness of breath at the request of Dr. Sharolyn Douglas.  Assessment & Plan    Acute on Chronic combined systolic and diastolic Heart Failure/Ischemic cardiomyopathy - Patient presented with abdominal fullness and shortness of breath. - BNP 2,307.  - CXR with no acute changes.  - EKG with no new changes.  - H/o of non-compliance and recent release from HF services. - Patient admits to stopping her torsemide because it was making her feel bad as well as salt intake and high fluid intake - Last echo 11/2018 EF 15%, G3DD - UOP 2.3L yesterday and net neg 4.7L - Abdomen remains very firm - seems to be where she holds her fluid - Creatinine bumped slight from 1.5>1.58 (baseline 1.5) but still appears volume overloaded in her abdomen - Continue with diuresis IV 40 mg BID - Dietary consult for HF/ low salt diet - Increase Fluid restriction 1200cc - continue BB, spiro - Patient will likely need Lasix as outpatient for volume management  CAD s/p PCI x 2 LAD 2015 - No active chest pain - HS troponin 17 > 18 - continue ASA and Brilinta - continue BB and statin  HTN - BP stable at 122/52mmhg - continue spiro, Imdur 50 mg daily, hydralazine 37.5 mg daily  HLD - continue statin  CKD stage III - on admission was 1.46. baseline around 1.5 - continue to follow    I have spent a total of 30 minutes with patient reviewing 2D echo , telemetry, EKGs, labs and examining patient as well as establishing an assessment and plan that was discussed with the patient.  > 50% of time was spent in  direct patient care.    For questions or updates, please contact [facility] Please consult [email or web address] for contact info under Cardiology/STEMI.      Signed, Armanda Magic, MD  10/14/2019, 10:59 AM

## 2019-10-14 NOTE — Progress Notes (Signed)
PROGRESS NOTE  Deborah Ho:[id number] DOB: 1951/03/02 DOA: 10/11/2019 PCP: [facility]  HPI/Recap of past 24 hours: Deborah Ho is an 68 y.o. female with a history ofchronic HFrEF(EF 15%), HTN, HLD, CAD s/p DESx2 2015, ICM, stage IIIb CKD, tobacco abuse, non-compliance to meds and diet, poor insight to disease, presented to the ED, c/o increasing abdominal girth and worsening shortness of breath/inability to take deep breaths, remained severely dyspneic limiting mobility so recommended hospitalist admission for diuresis.    Today, patient still with significant abdominal distention, with some shortness of breath.  Denies any chest pain, cough, nausea/vomiting/diarrhea, fever/chills.   Assessment/Plan: Principal Problem:   Acute on chronic systolic CHF (congestive heart failure) (HCC) Active Problems:   HLD (hyperlipidemia)   Cardiomyopathy, ischemic   Coronary atherosclerosis of native coronary artery   Essential hypertension   CKD (chronic kidney disease), stage III   Abdominal distension   Noncompliance with medication regimen   Obesity with body mass index 30 or greater   Acute on chronic diastolic CHF (congestive heart failure) (HCC)   Acute on chronic combined systolic and diastolic HF/ischemic cardiomyopathy Patient significantly fluid overloaded, abdominal distention/shortness of breath BNP 2307 Chest x-ray showed chronic cardiomegaly Troponin 17-->18, EKG with no acute ST changes Echo done on 11/2018 showed EF of 15%, grade 3 diastolic dysfunction Cardiology consulted Continue IV Lasix, Coreg, hydralazine, Imdur, spironolactone Strict I's and O's, daily weights, fluid restriction May consider paracentesis if ascites is not significantly improving Telemetry  CAD status post PCI x2 in 2015/HLD Currently chest pain-free Troponin/EKG as above Continue aspirin, Brilinta, Coreg, statin  Hypertension Continue hydralazine, Imdur,  spironolactone  CKD stage III Baseline creatinine around 1.5 Daily BMP as patient is on diuretics  GOC Palliative care consulted  Obesity Lifestyle modification advised       Malnutrition Type:      Malnutrition Characteristics:      Nutrition Interventions:       Estimated body mass index is 37.34 kg/m as calculated from the following:   Height as of this encounter: 5\' 5"  (1.651 m).   Weight as of this encounter: 101.8 kg.     Code Status: Full  Family Communication: None at bedside  Disposition Plan: After significant diuresis   Consultants:  Cardiology  Procedures:  None  Antimicrobials:  None  DVT prophylaxis: Lovenox   Objective: Vitals:   10/14/19 0009 10/14/19 0443 10/14/19 0812 10/14/19 1406  BP: 109/70 120/64 122/61 117/62  Pulse: 72 74 74 77  Resp: 20 18 20    Temp: 97.7 F (36.5 C) 97.7 F (36.5 C) (!) 97.4 F (36.3 C)   TempSrc: Oral Oral Oral   SpO2: 97% 99% 99% 98%  Weight: 101.8 kg     Height:        Intake/Output Summary (Last 24 hours) at 10/14/2019 1713 Last data filed at 10/14/2019 1600 Gross per 24 hour  Intake 1200 ml  Output 2050 ml  Net -850 ml   Filed Weights   10/12/19 0026 10/13/19 0442 10/14/19 0009  Weight: 105 kg 102.8 kg 101.8 kg    Exam:  General: NAD, chronically ill-appearing  Cardiovascular: S1, S2 present  Respiratory:  Diminished breath sounds at the bases  Abdomen: Soft, nontender, tense/+++distended, bowel sounds present  Musculoskeletal: 1+ bilateral pedal edema noted  Skin: Normal  Psychiatry: Normal mood   Data Reviewed: CBC: Recent Labs  Lab 10/11/19 1805 10/14/19 0432  WBC 5.9 5.4  NEUTROABS  --  4.2  HGB 14.6 13.6  HCT 48.5* 43.2  MCV 92.6 88.2  PLT 227 179   Basic Metabolic Panel: Recent Labs  Lab 10/11/19 1151 10/11/19 1805 10/12/19 0301 10/13/19 0406 10/14/19 0432  NA 144 145 147* 145 146*  K 4.6 4.1 3.8 3.7 4.0  CL 109* 112* 113* 107 106  CO2  20 18* 22 23 28   GLUCOSE 91 100* 93 87 88  BUN 24 24* 24* 24* 24*  CREATININE 1.37* 1.54* 1.46* 1.50* 1.58*  CALCIUM 9.7 9.8 9.5 9.6 9.4   GFR: Estimated Creatinine Clearance: 40.3 mL/min (A) (by C-G formula based on SCr of 1.58 mg/dL (H)). Liver Function Tests: Recent Labs  Lab 10/14/19 0843  AST 18  ALT 12  ALKPHOS 128*  BILITOT 3.6*  PROT 6.5  ALBUMIN 3.1*   No results for input(s): LIPASE, AMYLASE in the last 168 hours. No results for input(s): AMMONIA in the last 168 hours. Coagulation Profile: Recent Labs  Lab 10/14/19 0843  INR 1.2   Cardiac Enzymes: No results for input(s): CKTOTAL, CKMB, CKMBINDEX, TROPONINI in the last 168 hours. BNP (last 3 results) No results for input(s): PROBNP in the last 8760 hours. HbA1C: No results for input(s): HGBA1C in the last 72 hours. CBG: No results for input(s): GLUCAP in the last 168 hours. Lipid Profile: No results for input(s): CHOL, HDL, LDLCALC, TRIG, CHOLHDL, LDLDIRECT in the last 72 hours. Thyroid Function Tests: No results for input(s): TSH, T4TOTAL, FREET4, T3FREE, THYROIDAB in the last 72 hours. Anemia Panel: No results for input(s): VITAMINB12, FOLATE, FERRITIN, TIBC, IRON, RETICCTPCT in the last 72 hours. Urine analysis:    Component Value Date/Time   COLORURINE YELLOW 12/16/2018 0024   APPEARANCEUR CLEAR 12/16/2018 0024   LABSPEC 1.034 (H) 12/16/2018 0024   PHURINE 5.0 12/16/2018 0024   GLUCOSEU NEGATIVE 12/16/2018 0024   HGBUR NEGATIVE 12/16/2018 0024   BILIRUBINUR NEGATIVE 12/16/2018 0024   KETONESUR NEGATIVE 12/16/2018 0024   PROTEINUR 100 (A) 12/16/2018 0024   UROBILINOGEN 1.0 07/25/2015 1601   NITRITE NEGATIVE 12/16/2018 0024   LEUKOCYTESUR NEGATIVE 12/16/2018 0024   Sepsis Labs: @LABRCNTIP (procalcitonin:4,lacticidven:4)  ) Recent Results (from the past 240 hour(s))  SARS CORONAVIRUS 2 (TAT 6-24 HRS) Nasopharyngeal Nasopharyngeal Swab     Status: None   Collection Time: 10/13/19  7:20 AM    Specimen: Nasopharyngeal Swab  Result Value Ref Range Status   SARS Coronavirus 2 NEGATIVE NEGATIVE Final    Comment: (NOTE) SARS-CoV-2 target nucleic acids are NOT DETECTED. The SARS-CoV-2 RNA is generally detectable in upper and lower respiratory specimens during the acute phase of infection. Negative results do not preclude SARS-CoV-2 infection, do not rule out co-infections with other pathogens, and should not be used as the sole basis for treatment or other patient management decisions. Negative results must be combined with clinical observations, patient history, and epidemiological information. The expected result is Negative. Fact Sheet for Patients: Fact Sheet for Healthcare Providers: 10/15/19 This test is not yet approved or cleared by the [place] FDA and  has been authorized for detection and/or diagnosis of SARS-CoV-2 by FDA under an Emergency Use Authorization (EUA). This EUA will remain  in effect (meaning this test can be used) for the duration of the COVID-19 declaration under Section 56 4(b)(1) of the Act, 21 U.S.C. section 360bbb-3(b)(1), unless the authorization is terminated or revoked sooner. Performed at [facility], [address] [place], [address] [place]       Studies: [place] Abdomen Limited  Result Date: 10/14/2019 CLINICAL DATA:  Ischemic cardiomyopathy, fluid overload, abdominal distension, shortness of breath EXAM: LIMITED ABDOMEN ULTRASOUND FOR ASCITES TECHNIQUE: Limited ultrasound survey for ascites was performed in all four abdominal quadrants. COMPARISON:  04/23/2019 FINDINGS: Moderate abdominal ascites, appears slightly increased since prior study. IMPRESSION: Marginal worsening of moderate abdominal ascites Electronically Signed   By: Lucrezia Europe M.D.   On: 10/14/2019 13:30    Scheduled Meds: . atorvastatin  80 mg Oral q1800  . carvedilol  3.125 mg Oral BID  WC  . enoxaparin (LOVENOX) injection  40 mg Subcutaneous Q24H  . ferrous sulfate  325 mg Oral 3 times weekly  . furosemide  40 mg Intravenous BID  . hydrALAZINE  37.5 mg Oral TID  . isosorbide mononitrate  60 mg Oral Daily  . loratadine  10 mg Oral Daily  . sodium chloride flush  3 mL Intravenous Q12H  . spironolactone  25 mg Oral Daily  . ticagrelor  60 mg Oral BID    Continuous Infusions: . sodium chloride       LOS: 2 days     Alma Friendly, MD [facility]  If 7PM-7AM, please contact night-coverage [email or web address] 10/14/2019, 5:13 PM

## 2019-10-15 DIAGNOSIS — Z7189 Other specified counseling: Secondary | ICD-10-CM

## 2019-10-15 DIAGNOSIS — Z515 Encounter for palliative care: Secondary | ICD-10-CM

## 2019-10-15 DIAGNOSIS — I5033 Acute on chronic diastolic (congestive) heart failure: Secondary | ICD-10-CM

## 2019-10-15 LAB — CBC WITH DIFFERENTIAL/PLATELET
Abs Immature Granulocytes: 0.02 10*3/uL (ref 0.00–0.07)
Basophils Absolute: 0 10*3/uL (ref 0.0–0.1)
Basophils Relative: 1 %
Eosinophils Absolute: 0.1 10*3/uL (ref 0.0–0.5)
Eosinophils Relative: 2 %
HCT: 42.5 % (ref 36.0–46.0)
Hemoglobin: 13.5 g/dL (ref 12.0–15.0)
Immature Granulocytes: 0 %
Lymphocytes Relative: 8 %
Lymphs Abs: 0.5 10*3/uL — ABNORMAL LOW (ref 0.7–4.0)
MCH: 27.8 pg (ref 26.0–34.0)
MCHC: 31.8 g/dL (ref 30.0–36.0)
MCV: 87.6 fL (ref 80.0–100.0)
Monocytes Absolute: 0.7 10*3/uL (ref 0.1–1.0)
Monocytes Relative: 11 %
Neutro Abs: 4.7 10*3/uL (ref 1.7–7.7)
Neutrophils Relative %: 78 %
Platelets: 183 10*3/uL (ref 150–400)
RBC: 4.85 MIL/uL (ref 3.87–5.11)
RDW: 15.7 % — ABNORMAL HIGH (ref 11.5–15.5)
WBC: 6 10*3/uL (ref 4.0–10.5)
nRBC: 0 % (ref 0.0–0.2)

## 2019-10-15 LAB — BASIC METABOLIC PANEL
Anion gap: 12 (ref 5–15)
BUN: 23 mg/dL (ref 8–23)
CO2: 27 mmol/L (ref 22–32)
Calcium: 9.1 mg/dL (ref 8.9–10.3)
Chloride: 104 mmol/L (ref 98–111)
Creatinine, Ser: 1.45 mg/dL — ABNORMAL HIGH (ref 0.44–1.00)
GFR calc Af Amer: 43 mL/min — ABNORMAL LOW (ref >60)
GFR calc non Af Amer: 37 mL/min — ABNORMAL LOW (ref >60)
Glucose, Bld: 91 mg/dL (ref 70–99)
Potassium: 3.4 mmol/L — ABNORMAL LOW (ref 3.5–5.1)
Sodium: 143 mmol/L (ref 135–145)

## 2019-10-15 MED ORDER — POTASSIUM CHLORIDE CRYS ER 20 MEQ PO TBCR
40.0000 meq | EXTENDED_RELEASE_TABLET | Freq: Once | ORAL | Status: AC
  Administered 2019-10-15: 40 meq via ORAL
  Filled 2019-10-15: qty 2

## 2019-10-15 MED ORDER — HYDRALAZINE HCL 50 MG PO TABS
50.0000 mg | ORAL_TABLET | Freq: Three times a day (TID) | ORAL | Status: DC
  Filled 2019-10-15 (×2): qty 1

## 2019-10-15 MED ORDER — POTASSIUM CHLORIDE CRYS ER 20 MEQ PO TBCR
20.0000 meq | EXTENDED_RELEASE_TABLET | Freq: Every day | ORAL | Status: DC
  Administered 2019-10-16 – 2019-10-21 (×6): 20 meq via ORAL
  Filled 2019-10-15: qty 2
  Filled 2019-10-15 (×6): qty 1

## 2019-10-15 NOTE — Consult Note (Addendum)
Consultation Note Date: 10/15/2019   Patient Name: Deborah Ho  DOB: February 27, 1951  MRN: [id number]  Age / Sex: 68 y.o., female  PCP: [facility] Referring Physician: Karie Kirks, DO  Reason for Consultation: Establishing goals of care  HPI/Patient Profile: 68 y.o. female with past medical history of chronic HFrEF (EF 15%), CAD s/p DESx2 2015, ICM, CKD stage IIIb, tobacco abuse, non-compliance with medications and diet admitted on 10/11/2019 with increasing abdominal girth and worsening shortness of breath. Hospital admission for acute on chronic heart failure with EF 15% and fluid overload. Cardiology following and aggressively diuresing. Palliative medicine consultation for goals of care.   Clinical Assessment and Goals of Care:  I have reviewed medical records, discussed with care team, and met with patient at bedside to discuss goals of care. Miliana is awake, alert, oriented and able to participate in discussion.   I introduced Palliative Medicine as specialized medical care for people living with serious illness. It focuses on providing relief from the symptoms and stress of a serious illness. The goal is to improve quality of life for both the patient and the family.  Patient reports her heart problems began in 2015. Izzy reports that she was doing well for a long period of time, keeping a journal with weights and food choices. She shares that she has not been hospitalized since June. She is followed at heart failure clinic.   Discussed course of hospitalization including diagnoses, interventions, and plan of care. Discussed chronic, irreversible nature of her heart disease and ongoing medical management. Discussed cautiousness with aggressively diuresing and monitoring kidney function with underlying chronic kidney disease.   Extensive time spent discussing her diet and the  importance of compliance with heart health, low sodium and fluid restricted diet. Reviewed RD recommendations and also went through multiple healthier food options. Reviewed many foods that Nyema has been eating that are high in sodium. She is surprised that many of these options have so much sodium. Strongly encouraged Hester to be more cognizant of nutritional labels and keep track of her daily sodium intake. Encouraged Benigna to review American Heart Association website, which shares many healthy options along with recipes.   Starlee jokes that she will eat the same thing every day if it meant her heart failure/fluid was better managed. Kassity is very motivated to get back on track with documenting her daily intake, weights in a journal.   Advanced directives, concepts specific to code status, artifical feeding and hydration were discussed. Seneca does not have a documented living will or POA. She has never considered who she would wish to be POA but possibly her sister, who is a Marine scientist. Encouraged Alvenia to consider and complete these documents sooner than later, since she is awake and able to make medical decisions and discuss decisions/wishes with trusting decision maker. We discussed 'hoping for the best, but preparing for the worst.' Meighan is agreeable with chaplain visit to discuss AD packet. She desires FULL code/FULL scope treatment and again is motivated  to get back on track and stay out of the hospital.   Therapeutic listening. Answered questions and concerns to the best of my ability.    SUMMARY OF RECOMMENDATIONS    Patient desires FULL code/FULL scope treatment.  Spiritual consult to discuss AD packet. Encouraged patient to consider who she would wish to be documented POA and early discussions regarding her wishes if critically ill.   Extensive time spent discussing diet education. Patient is very motivated to get back on track with her diet in order to manage heart failure/fluid overload and  stay out of the hospital.   May benefit from paramedicine program. Followed at HF clinic.  May benefit from outpatient palliative referral.   Code Status/Advance Care Planning:  Full code  Symptom Management:   Per attending  Additional Recommendations (Limitations, Scope, Preferences):  Full Scope Treatment  Psycho-social/Spiritual:   Desire for further Chaplaincy support:yes  Additional Recommendations: Caregiving  Support/Resources  Prognosis:   Unable to determine  Discharge Planning: Home with Home Health      Primary Diagnoses: Present on Admission: . Abdominal distension . Acute on chronic systolic CHF (congestive heart failure) ([place]) . Cardiomyopathy, ischemic . CKD (chronic kidney disease), stage III . Coronary atherosclerosis of native coronary artery . HLD (hyperlipidemia) . Obesity with body mass index 30 or greater . Essential hypertension . Acute on chronic diastolic CHF (congestive heart failure) ([place])   I have reviewed the medical record, interviewed the patient and family, and examined the patient. The following aspects are pertinent.  Past Medical History:  Diagnosis Date  . CAD (coronary artery disease) 05/03/14; 05/09/14   a. anterior STEMI with early in-stent thorombosis for missed dose of Brillinta s/p PCI with DESx 2 into LAD (04/2014)  . CHF (congestive heart failure) ([place])   . GERD (gastroesophageal reflux disease)    Probable  . HTN (hypertension)   . Hyperlipidemia   . Ischemic cardiomyopathy    a. 04/2014 ECHO with EF 45-50% b.  Repeat 2D echo 08/14/14 with EF down at 15%. Life vest placed  . Non compliance w medication regimen   . Tobacco use    Social History   Socioeconomic History  . Marital status: Widowed    Spouse name: Not on file  . Number of children: Not on file  . Years of education: Not on file  . Highest education level: Not on file  Occupational History  . 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 Financial  resource strain: Not on file  . Food insecurity    Worry: Not on file    Inability: Not on file  . Transportation needs    Medical: Not on file    Non-medical: Not on file  Tobacco Use  . Smoking status: Current Some Day Smoker    Packs/day: 0.10    Years: 0.50    Pack years: 0.05    Types: Cigarettes  . Smokeless tobacco: Current User  . Tobacco comment: down to 3 cigarettes daily (08/03/14)  Substance and Sexual Activity  . Alcohol use: No  . Drug use: No  . Sexual activity: Not on file  Lifestyle  . Physical activity    Days per week: Not on file    Minutes per session: Not on file  . Stress: Not on file  Relationships  . Social Herbalist on phone: Not on file    Gets together: Not on file    Attends religious service: Not on file  Active member of club or organization: Not on file    Attends meetings of clubs or organizations: Not on file    Relationship status: Not on file  Other Topics Concern  . Not on file  Social History Narrative   Patient has 6 brothers and sisters and none have known coronary artery disease. She lives alone in [place], but has several brothers in the area.   Family History  Problem Relation Age of Onset  . Diabetes Mother   . Hypertension Mother    Scheduled Meds: . atorvastatin  80 mg Oral q1800  . carvedilol  3.125 mg Oral BID WC  . enoxaparin (LOVENOX) injection  40 mg Subcutaneous Q24H  . ferrous sulfate  325 mg Oral 3 times weekly  . furosemide  40 mg Intravenous BID  . hydrALAZINE  50 mg Oral TID  . isosorbide mononitrate  60 mg Oral Daily  . loratadine  10 mg Oral Daily  . [START ON 10/16/2019] potassium chloride  20 mEq Oral Daily  . sodium chloride flush  3 mL Intravenous Q12H  . spironolactone  25 mg Oral Daily  . ticagrelor  60 mg Oral BID   Continuous Infusions: . sodium chloride     PRN Meds:.sodium chloride, acetaminophen, benzonatate, fluticasone, nitroGLYCERIN, ondansetron (ZOFRAN) IV, sodium  chloride flush Medications Prior to Admission:  Prior to Admission medications   Medication Sig Start Date End Date Taking? Authorizing Provider  atorvastatin (LIPITOR) 80 MG tablet Take 80 mg by mouth daily. 04/28/19  Yes [provider]  carboxymethylcellulose (REFRESH PLUS) 0.5 % SOLN Place 1 drop into both eyes 3 (three) times daily as needed (dry eyes).   Yes [provider]  carvedilol (COREG) 3.125 MG tablet Take 1 tablet (3.125 mg total) by mouth 2 (two) times daily with a meal. 01/18/19  Yes Gherghe, Vella Redhead, MD  cetirizine (ZYRTEC) 10 MG tablet Take 10 mg daily by mouth.    Yes [provider]  cholecalciferol (VITAMIN D) 1000 units tablet Take 1,000 Units by mouth every 14 (fourteen) days. Twice a month   Yes [provider]  Ferrous Sulfate (IRON PO) Take 1 tablet by mouth 3 (three) times a week. Take on MWF   Yes [provider]  fluticasone (FLONASE) 50 MCG/ACT nasal spray Place 1 spray into both nostrils daily as needed for allergies (congestion).  04/04/18  Yes [provider]  hydrALAZINE (APRESOLINE) 25 MG tablet Take 1.5 tablets (37.5 mg total) by mouth 3 (three) times daily. 04/28/19  Yes Clegg, Amy D, NP  magnesium oxide (MAG-OX) 400 (241.3 Mg) MG tablet Take 400 mg by mouth every 14 (fourteen) days. Twice a month   Yes [provider]  nitroGLYCERIN (NITROSTAT) 0.4 MG SL tablet Place 0.4 mg under the tongue every 5 (five) minutes as needed for chest pain.   Yes [provider]  potassium chloride SA (K-DUR,KLOR-CON) 20 MEQ tablet Take 1 tablet (20 mEq total) by mouth daily. 12/04/18  Yes Georgette Shell, MD  furosemide (LASIX) 40 MG tablet Take 1 tablet (40 mg total) by mouth 2 (two) times daily. 10/11/19 10/05/20  Martinique, Peter M, MD  isosorbide mononitrate (IMDUR) 30 MG 24 hr tablet Take 2 tablets (60 mg total) by mouth daily. Patient not taking: Reported on 10/12/2019 04/28/19   Darrick Grinder D, NP   spironolactone (ALDACTONE) 25 MG tablet Take 1 tablet (25 mg total) by mouth daily. Patient not taking: Reported on 10/12/2019 04/28/19   Darrick Grinder  D, NP  ticagrelor (BRILINTA) 60 MG TABS tablet Take 1 tablet (60 mg total) by mouth 2 (two) times daily. Patient not taking: Reported on 10/12/2019 04/02/17   Louellen Molder, MD   Allergies  Allergen Reactions  . Aspirin Swelling    Chewable children's aspirin makes patients tongue and face swell  . Effient [Prasugrel] Swelling    Patient's tongue and face swells  . Entresto [Sacubitril-Valsartan] Other (See Comments)    dizziness  . Lactose Intolerance (Gi) Other (See Comments)    REACTION: stomach upset  . Robitussin Dm [Guaifenesin-Dm] Swelling    Patient's tongue swells  . Sulfa Antibiotics Swelling  . Other     Had to replace "catgut" with clamps   Review of Systems  Respiratory: Positive for shortness of breath.    Physical Exam Vitals signs and nursing note reviewed.  Constitutional:      General: She is awake.  HENT:     Head: Normocephalic and atraumatic.  Pulmonary:     Effort: No tachypnea, accessory muscle usage or respiratory distress.  Skin:    General: Skin is warm and dry.  Neurological:     Mental Status: She is alert and oriented to person, place, and time.  Psychiatric:        Mood and Affect: Mood normal.        Speech: Speech normal.        Behavior: Behavior normal.        Cognition and Memory: Cognition normal.    Vital Signs: BP 110/64 (BP Location: Right Arm)   Pulse 73   Temp (!) 97.4 F (36.3 C) (Oral)   Resp 20   Ht _0  (1.651 m)   Wt 100.3 kg   SpO2 97%   BMI 36.79 kg/m  Pain Scale: 0-10 POSS *See Group Information*: 1-Acceptable,Awake and alert Pain Score: 0-No pain   SpO2: SpO2: 97 % O2 Device:SpO2: 97 % O2 Flow Rate: .   IO: Intake/output summary:   Intake/Output Summary (Last 24 hours) at 10/15/2019 1455 Last data filed at 10/15/2019 1200 Gross per 24 hour  Intake 720  ml  Output 3000 ml  Net -2280 ml    LBM: Last BM Date: 10/14/19 Baseline Weight: Weight: 105 kg Most recent weight: Weight: 100.3 kg     Palliative Assessment/Data: PPS 70%     Time In: 1345 Time Out:1500 Time Total: 1mn Greater than 50%  of this time was spent counseling and coordinating care related to the above assessment and plan.  Signed by:  MIhor Dow DNP, FNP-C Palliative Medicine Team  Phone: [phone]: [phone]  Please contact Palliative Medicine Team phone at [phone] questions and concerns.  For individual provider: See AShea Evans

## 2019-10-15 NOTE — Plan of Care (Signed)

## 2019-10-15 NOTE — Evaluation (Signed)
Physical Therapy Evaluation Patient Details Name: Deborah Ho MRN: [id number] DOB: 01-24-1951 Today's Date: 10/15/2019   History of Present Illness  Pt adm with acute on chronic heart failure. PMH - chf, htn, cad, ckd, ischemic cardiomyopathy  Clinical Impression  Pt doing well with mobility and no further PT needed. Pt close to baseline with mobility.     Follow Up Recommendations No PT follow up    Equipment Recommendations  None recommended by PT    Recommendations for Other Services       Precautions / Restrictions Precautions Precautions: None Restrictions Weight Bearing Restrictions: No      Mobility  Bed Mobility               General bed mobility comments: Pt up in chair  Transfers Overall transfer level: Modified independent                  Ambulation/Gait Ambulation/Gait assistance: Modified independent (Device/Increase time) Gait Distance (Feet): 250 Feet Assistive device: None Gait Pattern/deviations: Step-through pattern   Gait velocity interpretation: 1.31 - 2.62 ft/sec, indicative of limited community ambulator General Gait Details: Steady gait  Stairs            Wheelchair Mobility    Modified Rankin (Stroke Patients Only)       Balance Overall balance assessment: No apparent balance deficits (not formally assessed)                                           Pertinent Vitals/Pain Pain Assessment: No/denies pain    Home Living Family/patient expects to be discharged to:: Private residence Living Arrangements: Alone Available Help at Discharge: Family;Friend(s);Available PRN/intermittently Type of Home: Apartment Home Access: Elevator     Home Layout: One level Home Equipment: Grab bars - toilet;Grab bars - tub/shower(safety button in bathroom) Additional Comments: lives in [place]    Prior Function Level of Independence: Independent               Hand Dominance   Dominant  Hand: Right    Extremity/Trunk Assessment   Upper Extremity Assessment Upper Extremity Assessment: Overall WFL for tasks assessed    Lower Extremity Assessment Lower Extremity Assessment: Overall WFL for tasks assessed       Communication   Communication: No difficulties  Cognition Arousal/Alertness: Awake/alert Behavior During Therapy: WFL for tasks assessed/performed Overall Cognitive Status: Within Functional Limits for tasks assessed                                        General Comments General comments (skin integrity, edema, etc.): SpO2 98% on RA with ambulation    Exercises     Assessment/Plan    PT Assessment Patent does not need any further PT services  PT Problem List         PT Treatment Interventions      PT Goals (Current goals can be found in the Care Plan section)  Acute Rehab PT Goals PT Goal Formulation: All assessment and education complete, DC therapy    Frequency     Barriers to discharge        Co-evaluation               AM-PAC PT "6 Clicks" Mobility  Outcome Measure Help needed turning  from your back to your side while in a flat bed without using bedrails?: None Help needed moving from lying on your back to sitting on the side of a flat bed without using bedrails?: None Help needed moving to and from a bed to a chair (including a wheelchair)?: None Help needed standing up from a chair using your arms (e.g., wheelchair or bedside chair)?: None Help needed to walk in hospital room?: None Help needed climbing 3-5 steps with a railing? : None 6 Click Score: 24    End of Session   Activity Tolerance: Patient tolerated treatment well Patient left: in chair;with call bell/phone within reach   PT Visit Diagnosis: Other abnormalities of gait and mobility (R26.89)    Time: 1145-1200 PT Time Calculation (min) (ACUTE ONLY): 15 min   Charges:   PT Evaluation $PT Eval Low Complexity: 1 Low          [facility] PT Acute Rehabilitation Services Pager [phone] Office [phone]   Angelina Ok [facility] 10/15/2019, 12:57 PM

## 2019-10-15 NOTE — Progress Notes (Signed)
PROGRESS NOTE  Deborah Ho VQQ:[id number] DOB: 01-01-1951 DOA: 10/11/2019 PCP: [facility]  Brief History   [name] an 68 y.o.femalewith a history ofchronic HFrEF(EF 15%), HTN, HLD, CAD s/p DESx2 2015, ICM, stage IIIb CKD, tobacco abuse, non-compliance to meds and diet, poor insight to disease, presented to the ED, c/o increasing abdominal girth and worsening shortness of breath/inability to take deep breaths, remained severely dyspneic limiting mobility so recommended hospitalist admission for diuresis.  Consultants  . Cardiology  Procedures  . None  Antibiotics   Anti-infectives (From admission, onward)   None    .  Subjective  The patient is sitting up at bedside. She states that her breathing is a little better, but she is still very dyspneic and swollen.   Objective   Vitals:  Vitals:   10/15/19 0829 10/15/19 1314  BP: 126/63 110/64  Pulse: 77 73  Resp:  20  Temp:  (!) 97.4 F (36.3 C)  SpO2: 98% 97%   Exam:  Constitutional:  . The patient is awake, alert, and oriented x 3. Mild dyspnea with conversation. Respiratory:  . No increased work of breathing. . No wheezes, rales, or rhonchi . No tactile fremitus Cardiovascular:  . Regular rate and rhythm . No murmurs, ectopy, or gallups. . No lateral PMI. No thrills. Abdomen:  . Abdomen is distended. Positive body wall edema and fluid wave. . No hernias, masses, or organomegaly . distant bowel sounds.  Musculoskeletal:  . No cyanosis or clubbing . Positive for 2-3 plus pitting edema  Skin:  . No rashes, lesions, ulcers . palpation of skin: no induration or nodules Neurologic:  . CN 2-12 intact . Sensation all 4 extremities intact Psychiatric:  . Mental status o Mood, affect appropriate o Orientation to person, place, time  . judgment and insight appear intact  I have personally reviewed the following:   Today's Data  . Vitals, BMP, CBC  Imaging  .  Abdominal ultrasound demonstrated interval worsening of moderate ascites.  Cardiology Data  . Echo from 11/2018 demonstrated EF of 15% and restrictive left ventricular relaxation with grade 3 diastolic dysfunction. Mild right sided systolic dysfunction.   Scheduled Meds: . atorvastatin  80 mg Oral q1800  . carvedilol  3.125 mg Oral BID WC  . enoxaparin (LOVENOX) injection  40 mg Subcutaneous Q24H  . ferrous sulfate  325 mg Oral 3 times weekly  . furosemide  40 mg Intravenous BID  . hydrALAZINE  50 mg Oral TID  . isosorbide mononitrate  60 mg Oral Daily  . loratadine  10 mg Oral Daily  . [START ON 10/16/2019] potassium chloride  20 mEq Oral Daily  . sodium chloride flush  3 mL Intravenous Q12H  . spironolactone  25 mg Oral Daily  . ticagrelor  60 mg Oral BID   Continuous Infusions: . sodium chloride      Principal Problem:   Acute on chronic systolic CHF (congestive heart failure) (HCC) Active Problems:   HLD (hyperlipidemia)   Cardiomyopathy, ischemic   Coronary atherosclerosis of native coronary artery   Essential hypertension   CKD (chronic kidney disease), stage III   Abdominal distension   Noncompliance with medication regimen   Obesity with body mass index 30 or greater   Acute on chronic diastolic CHF (congestive heart failure) (HCC)   LOS: 3 days   Acute on chronic combined systolic and diastolic HF/ischemic cardiomyopathy: Patient was admitted with significant fluid overload, abdominal distention/shortness of breath.  BNP 2307.  Echo done on 11/2018 showed EF of 83%, grade 3 diastolic dysfunction. Cardiology was consulted. Abdominal ultrasound demonstrated interval worsening of ascites. She is receiving IV lasix, coreg, hydralazine, imdur, and spironolactone. Her volume status will be closely monitored. She may require paracentesis if ascites is not relieved with diuresis.  Monitor on telemetry. Palliative care has been consulted.  CAD status post PCI x2 in 2015/HLD: No  complaints of chest pain. Troponin 17-->18, EKG with no acute ST changes. Monitor on telemetry. Continue aspirin, Brilinta, Coreg, statin. I appreciate cardiology's assistance.   Hypertension: Continue hydralazine, Imdur, spironolactone.  CKD stage III: Baseline creatinine around 1.5. Creatinine is 1.45 today. Monitor creatinine, volume status and electrolytes.  Obesity: Complicates all cares.  I have seen and examined this patient myself. I have spent 32 minutes in her evaluation and care.   DVT Prophylaxis: Lovenox CODE STATUS: Full Code Family Communication: None available Disposition: tbd

## 2019-10-15 NOTE — Progress Notes (Addendum)
Progress Note  Patient Name: Deborah Ho Date of Encounter: 10/15/2019  Primary Cardiologist: Peter Martinique, MD   Subjective   Still SOB and complaining of increased abdominal swelling.  Apparently spit out her pills last night.  Abd [place] with moderate ascites  Inpatient Medications    Scheduled Meds: . atorvastatin  80 mg Oral q1800  . carvedilol  3.125 mg Oral BID WC  . enoxaparin (LOVENOX) injection  40 mg Subcutaneous Q24H  . ferrous sulfate  325 mg Oral 3 times weekly  . furosemide  40 mg Intravenous BID  . hydrALAZINE  37.5 mg Oral TID  . isosorbide mononitrate  60 mg Oral Daily  . loratadine  10 mg Oral Daily  . sodium chloride flush  3 mL Intravenous Q12H  . spironolactone  25 mg Oral Daily  . ticagrelor  60 mg Oral BID   Continuous Infusions: . sodium chloride     PRN Meds: sodium chloride, acetaminophen, benzonatate, fluticasone, nitroGLYCERIN, ondansetron (ZOFRAN) IV, sodium chloride flush   Vital Signs    Vitals:   10/14/19 1406 10/14/19 2054 10/15/19 0427 10/15/19 0829  BP: 117/62 119/73 119/71 126/63  Pulse: 77 79 77 77  Resp:  18 20   Temp:  98.1 F (36.7 C) 97.8 F (36.6 C)   TempSrc:  Oral Oral   SpO2: 98% 98% 98% 98%  Weight:   100.3 kg   Height:        Intake/Output Summary (Last 24 hours) at 10/15/2019 0848 Last data filed at 10/15/2019 0451 Gross per 24 hour  Intake 600 ml  Output 1900 ml  Net -1300 ml   Filed Weights   10/13/19 0442 10/14/19 0009 10/15/19 0427  Weight: 102.8 kg 101.8 kg 100.3 kg    Telemetry    NSR- Personally Reviewed  ECG    No new EKG to review -  Personally Reviewed  Physical Exam   GEN: Well nourished, well developed in no acute distress HEENT: Normal NECK: No JVD; No carotid bruits LYMPHATICS: No lymphadenopathy CARDIAC:RRR, no murmurs, rubs, gallops RESPIRATORY:  Clear to auscultation without rales, wheezing or rhonchi  ABDOMEN: abdomen still enlarged and firm MUSCULOSKELETAL:  1+ BLE edema;  No deformity  SKIN: Warm and dry NEUROLOGIC:  Alert and oriented x 3 PSYCHIATRIC:  Normal affect    Labs    Chemistry Recent Labs  Lab 10/13/19 0406 10/14/19 0432 10/14/19 0843 10/15/19 0511  NA 145 146*  --  143  K 3.7 4.0  --  3.4*  CL 107 106  --  104  CO2 23 28  --  27  GLUCOSE 87 88  --  91  BUN 24* 24*  --  23  CREATININE 1.50* 1.58*  --  1.45*  CALCIUM 9.6 9.4  --  9.1  PROT  --   --  6.5  --   ALBUMIN  --   --  3.1*  --   AST  --   --  18  --   ALT  --   --  12  --   ALKPHOS  --   --  128*  --   BILITOT  --   --  3.6*  --   GFRNONAA 35* 33*  --  37*  GFRAA 41* 39*  --  43*  ANIONGAP 15 12  --  12     Hematology Recent Labs  Lab 10/11/19 1805 10/14/19 0432 10/15/19 0511  WBC 5.9 5.4 6.0  RBC 5.24* 4.90 4.85  HGB 14.6 13.6 13.5  HCT 48.5* 43.2 42.5  MCV 92.6 88.2 87.6  MCH 27.9 27.8 27.8  MCHC 30.1 31.5 31.8  RDW 15.7* 15.6* 15.7*  PLT 227 179 183    Cardiac EnzymesNo results for input(s): TROPONINI in the last 168 hours. No results for input(s): TROPIPOC in the last 168 hours.   BNP Recent Labs  Lab 10/11/19 1151 10/11/19 1815  BNP 1,706.0* 2,307.0*     DDimer No results for input(s): DDIMER in the last 168 hours.   Radiology    US Abdomen Limited  Result Date: 10/14/2019 CLINICAL DATA:  Ischemic cardiomyopathy, fluid overload, abdominal distension, shortness of breath EXAM: LIMITED ABDOMEN ULTRASOUND FOR ASCITES TECHNIQUE: Limited ultrasound survey for ascites was performed in all four abdominal quadrants. COMPARISON:  04/23/2019 FINDINGS: Moderate abdominal ascites, appears slightly increased since prior study. IMPRESSION: Marginal worsening of moderate abdominal ascites Electronically Signed   By: Corlis Leak M.D.   On: 10/14/2019 13:30    Cardiac Studies   Echo 11/28/18 Study Conclusions  - Left ventricle: The cavity size was normal. Wall thickness was increased in a pattern of mild LVH. The estimated ejection fraction was  15%. Diffuse hypokinesis. No mural thrombus by Definity contrast. Doppler parameters are consistent with restrictive left ventricular relaxation (grade 3 diastolic dysfunction). The E/e&' ratio is >20, suggesting markedly elevated LV filling pressure. - Mitral valve: Mildly thickened leaflets . There was moderate regurgitation. - Left atrium: The atrium was normal in size. - Right ventricle: The cavity size was moderately dilated. Mild systolic dysfunction. - Right atrium: Severely dilated. - Tricuspid valve: There was moderate regurgitation. - Pulmonary arteries: PA peak pressure: 53 mm Hg (S). - Inferior vena cava: The vessel was dilated. The respirophasic diameter changes were blunted (<50%), consistent with elevated central venous pressure.   Patient Profile     68 y.o. female with a hx of chronic combined systolic and diastolic Heart Failure (EF 18%), refused ICD, CAD with anterior STEMI due to early stent thrombosis for missing Brilinta and had PCIx2 to LAD 2015, ishcemic cardiomyopathy, morbid obesity, noncompliance, HTN, and HLD who is being seen today for the evaluation of abdominal pain and shortness of breath at the request of Dr. Sharolyn Douglas.  Assessment & Plan    Acute on Chronic combined systolic and diastolic Heart Failure/Ischemic cardiomyopathy - Patient presented with abdominal fullness and shortness of breath. - BNP 2,307.  - CXR with no acute changes.  - EKG with no new changes.  - H/o of non-compliance and recent release from HF services. - Patient admits to stopping her torsemide because it was making her feel bad as well as salt intake and high fluid intake - Last echo 11/2018 EF 15%, G3DD - she put out 1.9L yesterday and is net neg 6.1L - Abdomen still remains very firm - seems to be where she holds her fluid - abdominal US with moderate ascites - may need to consider paracentesis if does not improve with diuresis - Creatinine improved today  with diuresis 1.5>1.58>1.45 (baseline 1.5) but still appears volume overloaded in her abdomen - Continue with diuresis IV 40 mg BID - responding very well but still very fluid overloaded - Dietary consult for HF/ low salt diet - continue Fluid restriction 1200cc - continue BB, spiro - Increase Hydralazine to 50mg  TID for HF  CAD s/p PCI x 2 LAD 2015 - No active chest pain - HS troponin 17 > 18 - continue  Brilinta , allergic to ASA with  tongue swelling - continue BB and statin  HTN - BP controlled at 126/100mmHg this am - continue spiro, Imdur 60 mg daily - increase Hydralazine to 50 TID for HF  HLD - continue statin  CKD stage III - on admission was 1.46. baseline around 1.5 - continue to follow  Hypokalemia -K+ 3.4 today -replete and repeat BMET in am   For questions or updates, please contact [facility] Please consult [email or web address] for contact info under Cardiology/STEMI.      Signed, Armanda Magic, MD  10/15/2019, 8:48 AM

## 2019-10-16 DIAGNOSIS — I5043 Acute on chronic combined systolic (congestive) and diastolic (congestive) heart failure: Secondary | ICD-10-CM

## 2019-10-16 DIAGNOSIS — Z7189 Other specified counseling: Secondary | ICD-10-CM

## 2019-10-16 LAB — CBC WITH DIFFERENTIAL/PLATELET
Abs Immature Granulocytes: 0.03 10*3/uL (ref 0.00–0.07)
Basophils Absolute: 0 10*3/uL (ref 0.0–0.1)
Basophils Relative: 1 %
Eosinophils Absolute: 0.1 10*3/uL (ref 0.0–0.5)
Eosinophils Relative: 2 %
HCT: 44.4 % (ref 36.0–46.0)
Hemoglobin: 13.8 g/dL (ref 12.0–15.0)
Immature Granulocytes: 1 %
Lymphocytes Relative: 9 %
Lymphs Abs: 0.6 10*3/uL — ABNORMAL LOW (ref 0.7–4.0)
MCH: 27.8 pg (ref 26.0–34.0)
MCHC: 31.1 g/dL (ref 30.0–36.0)
MCV: 89.5 fL (ref 80.0–100.0)
Monocytes Absolute: 0.7 10*3/uL (ref 0.1–1.0)
Monocytes Relative: 11 %
Neutro Abs: 5.1 10*3/uL (ref 1.7–7.7)
Neutrophils Relative %: 76 %
Platelets: 187 10*3/uL (ref 150–400)
RBC: 4.96 MIL/uL (ref 3.87–5.11)
RDW: 15.9 % — ABNORMAL HIGH (ref 11.5–15.5)
WBC: 6.6 10*3/uL (ref 4.0–10.5)
nRBC: 0 % (ref 0.0–0.2)

## 2019-10-16 LAB — BASIC METABOLIC PANEL
Anion gap: 15 (ref 5–15)
BUN: 24 mg/dL — ABNORMAL HIGH (ref 8–23)
CO2: 29 mmol/L (ref 22–32)
Calcium: 9.1 mg/dL (ref 8.9–10.3)
Chloride: 100 mmol/L (ref 98–111)
Creatinine, Ser: 1.58 mg/dL — ABNORMAL HIGH (ref 0.44–1.00)
GFR calc Af Amer: 39 mL/min — ABNORMAL LOW (ref >60)
GFR calc non Af Amer: 33 mL/min — ABNORMAL LOW (ref >60)
Glucose, Bld: 89 mg/dL (ref 70–99)
Potassium: 3.9 mmol/L (ref 3.5–5.1)
Sodium: 144 mmol/L (ref 135–145)

## 2019-10-16 MED ORDER — HYDRALAZINE HCL 10 MG PO TABS
10.0000 mg | ORAL_TABLET | Freq: Three times a day (TID) | ORAL | Status: DC
  Administered 2019-10-16 – 2019-10-17 (×3): 10 mg via ORAL
  Filled 2019-10-16 (×3): qty 1

## 2019-10-16 NOTE — Progress Notes (Signed)
Progress Note  Patient Name: Deborah Ho Date of Encounter: 10/16/2019  Primary Cardiologist: Peter Martinique, MD   Subjective   Abdominal swelling is improving. Now more educated about diet and fluid restriction.   Inpatient Medications    Scheduled Meds: . atorvastatin  80 mg Oral q1800  . carvedilol  3.125 mg Oral BID WC  . enoxaparin (LOVENOX) injection  40 mg Subcutaneous Q24H  . ferrous sulfate  325 mg Oral 3 times weekly  . furosemide  40 mg Intravenous BID  . hydrALAZINE  50 mg Oral TID  . isosorbide mononitrate  60 mg Oral Daily  . loratadine  10 mg Oral Daily  . potassium chloride  20 mEq Oral Daily  . sodium chloride flush  3 mL Intravenous Q12H  . spironolactone  25 mg Oral Daily  . ticagrelor  60 mg Oral BID   Continuous Infusions: . sodium chloride     PRN Meds: sodium chloride, acetaminophen, benzonatate, fluticasone, nitroGLYCERIN, ondansetron (ZOFRAN) IV, sodium chloride flush   Vital Signs    Vitals:   10/15/19 0829 10/15/19 1314 10/15/19 2039 10/16/19 0446  BP: 126/63 110/64 114/73 121/89  Pulse: 77 73 76 81  Resp:  20 18 18   Temp:  (!) 97.4 F (36.3 C) 97.6 F (36.4 C) 97.9 F (36.6 C)  TempSrc:  Oral Oral Oral  SpO2: 98% 97% 99% 99%  Weight:    98.2 kg  Height:        Intake/Output Summary (Last 24 hours) at 10/16/2019 0928 Last data filed at 10/16/2019 0850 Gross per 24 hour  Intake 600 ml  Output 2350 ml  Net -1750 ml   Last 3 Weights 10/16/2019 10/15/2019 10/14/2019  Weight (lbs) 216 lb 8 oz 221 lb 1.6 oz 224 lb 6.4 oz  Weight (kg) 98.204 kg 100.29 kg 101.787 kg      Telemetry    NSR - Personally Reviewed  ECG    N/A  Physical Exam   GEN: No acute distress.   Neck: No JVD Cardiac: RRR, no murmurs, rubs, or gallops.  Respiratory: Clear to auscultation bilaterally. GI: Soft, nontender, + distended  MS: 1+ BL LE edema; No deformity. Neuro:  Nonfocal  Psych: Normal affect   Labs    High Sensitivity Troponin:    Recent Labs  Lab 10/11/19 2023 10/12/19 0001  TROPONINIHS 17 18*      Chemistry Recent Labs  Lab 10/14/19 0432 10/14/19 0843 10/15/19 0511 10/16/19 0523  NA 146*  --  143 144  K 4.0  --  3.4* 3.9  CL 106  --  104 100  CO2 28  --  27 29  GLUCOSE 88  --  91 89  BUN 24*  --  23 24*  CREATININE 1.58*  --  1.45* 1.58*  CALCIUM 9.4  --  9.1 9.1  PROT  --  6.5  --   --   ALBUMIN  --  3.1*  --   --   AST  --  18  --   --   ALT  --  12  --   --   ALKPHOS  --  128*  --   --   BILITOT  --  3.6*  --   --   GFRNONAA 33*  --  37* 33*  GFRAA 39*  --  43* 39*  ANIONGAP 12  --  12 15     Hematology Recent Labs  Lab 10/14/19 0432 10/15/19 0511 10/16/19 1324  WBC 5.4 6.0 6.6  RBC 4.90 4.85 4.96  HGB 13.6 13.5 13.8  HCT 43.2 42.5 44.4  MCV 88.2 87.6 89.5  MCH 27.8 27.8 27.8  MCHC 31.5 31.8 31.1  RDW 15.6* 15.7* 15.9*  PLT 179 183 187    BNP Recent Labs  Lab 10/11/19 1151 10/11/19 1815  BNP 1,706.0* 2,307.0*     DDimer No results for input(s): DDIMER in the last 168 hours.   Radiology    US Abdomen Limited  Result Date: 10/14/2019 CLINICAL DATA:  Ischemic cardiomyopathy, fluid overload, abdominal distension, shortness of breath EXAM: LIMITED ABDOMEN ULTRASOUND FOR ASCITES TECHNIQUE: Limited ultrasound survey for ascites was performed in all four abdominal quadrants. COMPARISON:  04/23/2019 FINDINGS: Moderate abdominal ascites, appears slightly increased since prior study. IMPRESSION: Marginal worsening of moderate abdominal ascites Electronically Signed   By: Corlis Leak M.D.   On: 10/14/2019 13:30    Cardiac Studies   None this admission  Patient Profile     Deborah y.o. female with a hx of chronic combined systolic and diastolic Heart Failure (EF 18%), refused ICD, CAD with anterior STEMI due to early stent thrombosis for missing Brilinta and had PCIx2 to LAD 2015, ishcemic cardiomyopathy, morbid obesity, noncompliance, HTN, and HLDwho is being seen  for the  evaluation ofabdominal pain and shortness of breathat the request ofDr. Ezenduka.  Assessment & Plan    1. Acute on Chronic combined systolic and diastolic Heart Failure/Ischemic cardiomyopathy - in setting of non compliance to meds, salt restriction and diet>>> seem more educated now - diuresed 8L with 15 lb weight loss -Symptoms improved - Continue IV diuresis - Scr stable - abdominal US with moderate ascites - may need to consider paracentesis if does not improve with diuresis -  increased Hydralazine to 50 TID, Continue coreg and imdur  2. CAD - No angina - Allergic to ASA -- Continue Brillinta, statin and BB  3. HTN - BP stable    For questions or updates, please contact [facility] Please consult [email or web address] for contact info under        SignedManson Passey, PA  10/16/2019, 9:28 AM

## 2019-10-16 NOTE — Progress Notes (Signed)
Paged [facility] at [place]. Pt declined hydralazine, stated decreases BP too much (SBP in 90's) and then she feels poorly. Spoke with MD via phone and informed patient refused   Assisted patient with elevating feet in chair. Patient declined recliner and stated hurts her back. Patient reported some tenderness in feet with swelling. RN again encouraged patient to sit in recliner; patient declined.   Patient declined Lovenox stating taking Brilinta.

## 2019-10-16 NOTE — Plan of Care (Signed)

## 2019-10-16 NOTE — Care Management Important Message (Signed)
Important Message  Patient Details  Name: Deborah Ho MRN: [id number] Date of Birth: 04-Sep-1951   Medicare Important Message Given:  Yes     [name] 10/16/2019, 12:39 PM

## 2019-10-16 NOTE — Progress Notes (Signed)
PROGRESS NOTE    Deborah Ho  YIR:[id number] DOB: 1951-08-07 DOA: 10/11/2019 PCP: [facility]   Brief Narrative: Deborah McCallumis an 68 y.o.femalewith a history ofchronic HFrEF(EF 15%), HTN, HLD, CAD s/p DESx2 2015, ICM, stage IIIb CKD, tobacco abuse, non-compliance to meds and diet, poor insight to disease, presented to the ED, c/o increasing abdominal girth and worsening shortness of breath/inability to take deep breaths, remained severely dyspneic limiting mobility so recommended hospitalist admission for diuresis.    Assessment & Plan:   Principal Problem:   Acute on chronic systolic CHF (congestive heart failure) (HCC) Active Problems:   HLD (hyperlipidemia)   Cardiomyopathy, ischemic   Coronary atherosclerosis of native coronary artery   Essential hypertension   CKD (chronic kidney disease), stage III   Abdominal distension   Noncompliance with medication regimen   Obesity with body mass index 30 or greater   Acute on chronic diastolic CHF (congestive heart failure) ([place])   Palliative care by specialist   Goals of care, counseling/discussion   1-Acute on chronic combined systolic and diastolic HF/ischemic cardiomyopathy:  -Patient was admitted with significant fluid overload, abdominal distention/shortness of breath.  -BNP 2307. Echo done on 11/2018 showed EF of 50%, grade 3 diastolic dysfunction. Cardiology was consulted. Abdominal ultrasound demonstrated interval worsening of ascites.  -She is receiving IV lasix, coreg, imdur, and spironolactone. -Follow cardiology recommendation in regards paracentesis.  -Continue with IV lasix.  -negative 9 L.   CAD status post PCI x2 in 2015/HLD: No complaints of chest pain. Troponin 17-->18, EKG with no acute ST changes. Monitor on telemetry.  -Continue aspirin, Brilinta, Coreg, statin. I appreciate cardiology's assistance.   Hypertension: Continue , Imdur, spironolactone. refuse to take  hydralazine.   CKD stage III: Baseline creatinine around 1.5. currently at baseline. Monitor on IV lasix.   Obesity: Continue counseling regarding weight loss.   Estimated body mass index is 36.03 kg/m as calculated from the following:   Height as of this encounter: 5\' 5"  (1.651 m).   Weight as of this encounter: 98.2 kg.   DVT prophylaxis: Lovenox Code Status: full code Family Communication:care discussed with patient Disposition Plan: remain in the hospital for management HF, needs IV lasix  Consultants:   Cardiology  Procedures:   none  Antimicrobials:  none  Subjective: She is not breathing at baseline. Denies abdominal pain. Report dyspnea. Not feeling well  Objective: Vitals:   10/16/19 0446 10/16/19 1011 10/16/19 1117 10/16/19 1631  BP: 121/89 124/79 118/64 117/82  Pulse: 81 80 81 82  Resp: 18  20 20   Temp: 97.9 F (36.6 C) 97.7 F (36.5 C)  98.1 F (36.7 C)  TempSrc: Oral Oral  Oral  SpO2: 99% 96% 94% 98%  Weight: 98.2 kg     Height:        Intake/Output Summary (Last 24 hours) at 10/16/2019 1805 Last data filed at 10/16/2019 1633 Gross per 24 hour  Intake 723 ml  Output 2600 ml  Net -1877 ml   Filed Weights   10/14/19 0009 10/15/19 0427 10/16/19 0446  Weight: 101.8 kg 100.3 kg 98.2 kg    Examination:  General exam: Appears calm and comfortable  Respiratory system: Clear to auscultation. Respiratory effort normal. Cardiovascular system: S1 & S2 heard, RRR. No JVD, murmurs, rubs, gallops or clicks. No pedal edema. Gastrointestinal system: Abdomen is nondistended, soft and nontender. No organomegaly or masses felt. Normal bowel sounds heard. Central nervous system: Alert and oriented. No focal neurological deficits. Extremities:  Symmetric 5 x 5 power. Skin: No rashes, lesions or ulcers Psychiatry: Judgement and insight appear normal. Mood & affect appropriate.     Data Reviewed: I have personally reviewed following labs and imaging studies   CBC: Recent Labs  Lab 10/11/19 1805 10/14/19 0432 10/15/19 0511 10/16/19 0523  WBC 5.9 5.4 6.0 6.6  NEUTROABS  --  4.2 4.7 5.1  HGB 14.6 13.6 13.5 13.8  HCT 48.5* 43.2 42.5 44.4  MCV 92.6 88.2 87.6 89.5  PLT 227 179 183 187   Basic Metabolic Panel: Recent Labs  Lab 10/12/19 0301 10/13/19 0406 10/14/19 0432 10/15/19 0511 10/16/19 0523  NA 147* 145 146* 143 144  K 3.8 3.7 4.0 3.4* 3.9  CL 113* 107 106 104 100  CO2 22 23 28 27 29   GLUCOSE 93 87 88 91 89  BUN 24* 24* 24* 23 24*  CREATININE 1.46* 1.50* 1.58* 1.45* 1.58*  CALCIUM 9.5 9.6 9.4 9.1 9.1   GFR: Estimated Creatinine Clearance: 39.5 mL/min (A) (by C-G formula based on SCr of 1.58 mg/dL (H)). Liver Function Tests: Recent Labs  Lab 10/14/19 0843  AST 18  ALT 12  ALKPHOS 128*  BILITOT 3.6*  PROT 6.5  ALBUMIN 3.1*   No results for input(s): LIPASE, AMYLASE in the last 168 hours. No results for input(s): AMMONIA in the last 168 hours. Coagulation Profile: Recent Labs  Lab 10/14/19 0843  INR 1.2   Cardiac Enzymes: No results for input(s): CKTOTAL, CKMB, CKMBINDEX, TROPONINI in the last 168 hours. BNP (last 3 results) No results for input(s): PROBNP in the last 8760 hours. HbA1C: No results for input(s): HGBA1C in the last 72 hours. CBG: No results for input(s): GLUCAP in the last 168 hours. Lipid Profile: No results for input(s): CHOL, HDL, LDLCALC, TRIG, CHOLHDL, LDLDIRECT in the last 72 hours. Thyroid Function Tests: No results for input(s): TSH, T4TOTAL, FREET4, T3FREE, THYROIDAB in the last 72 hours. Anemia Panel: No results for input(s): VITAMINB12, FOLATE, FERRITIN, TIBC, IRON, RETICCTPCT in the last 72 hours. Sepsis Labs: No results for input(s): PROCALCITON, LATICACIDVEN in the last 168 hours.  Recent Results (from the past 240 hour(s))  SARS CORONAVIRUS 2 (TAT 6-24 HRS) Nasopharyngeal Nasopharyngeal Swab     Status: None   Collection Time: 10/13/19  7:20 AM   Specimen: Nasopharyngeal  Swab  Result Value Ref Range Status   SARS Coronavirus 2 NEGATIVE NEGATIVE Final    Comment: (NOTE) SARS-CoV-2 target nucleic acids are NOT DETECTED. The SARS-CoV-2 RNA is generally detectable in upper and lower respiratory specimens during the acute phase of infection. Negative results do not preclude SARS-CoV-2 infection, do not rule out co-infections with other pathogens, and should not be used as the sole basis for treatment or other patient management decisions. Negative results must be combined with clinical observations, patient history, and epidemiological information. The expected result is Negative. Fact Sheet for Patients: 10/15/19 Fact Sheet for Healthcare Providers: HairSlick.no This test is not yet approved or cleared by the quierodirigir.com FDA and  has been authorized for detection and/or diagnosis of SARS-CoV-2 by FDA under an Emergency Use Authorization (EUA). This EUA will remain  in effect (meaning this test can be used) for the duration of the COVID-19 declaration under Section 56 4(b)(1) of the Act, 21 U.S.C. section 360bbb-3(b)(1), unless the authorization is terminated or revoked sooner. Performed at [facility], [address], [place], [place]          Radiology Studies: No results  found.      Scheduled Meds: . atorvastatin  80 mg Oral q1800  . carvedilol  3.125 mg Oral BID WC  . enoxaparin (LOVENOX) injection  40 mg Subcutaneous Q24H  . ferrous sulfate  325 mg Oral 3 times weekly  . furosemide  40 mg Intravenous BID  . hydrALAZINE  10 mg Oral TID  . isosorbide mononitrate  60 mg Oral Daily  . loratadine  10 mg Oral Daily  . potassium chloride  20 mEq Oral Daily  . sodium chloride flush  3 mL Intravenous Q12H  . spironolactone  25 mg Oral Daily  . ticagrelor  60 mg Oral BID   Continuous Infusions: . sodium chloride       LOS: 4 days    Time spent: 35  minutes.     Alba Cory, MD [facility]   If 7PM-7AM, please contact night-coverage [email or web address] Password [email or web address] 10/16/2019, 6:05 PM

## 2019-10-16 NOTE — Progress Notes (Signed)
Responded to consult for a AD. PT was awake and sitting on her chair. Deborah Ho was open to talk. She stated that she was looking forward to going home soon. I gave her a copy of the Ad and she said that she was going to look over it with her family. She said that she was very grateful with the palliative team and the NP who came to see her yesterday. She said that she was going to talk to her sibling about the AD and get back to [facility] if she needs further assistance.   Palliative Care Chaplain Resident  Fidel Levy [phone]

## 2019-10-17 DIAGNOSIS — I5041 Acute combined systolic (congestive) and diastolic (congestive) heart failure: Secondary | ICD-10-CM

## 2019-10-17 LAB — CBC WITH DIFFERENTIAL/PLATELET
Abs Immature Granulocytes: 0.03 10*3/uL (ref 0.00–0.07)
Basophils Absolute: 0 10*3/uL (ref 0.0–0.1)
Basophils Relative: 0 %
Eosinophils Absolute: 0.1 10*3/uL (ref 0.0–0.5)
Eosinophils Relative: 2 %
HCT: 43.5 % (ref 36.0–46.0)
Hemoglobin: 13.7 g/dL (ref 12.0–15.0)
Immature Granulocytes: 1 %
Lymphocytes Relative: 10 %
Lymphs Abs: 0.6 10*3/uL — ABNORMAL LOW (ref 0.7–4.0)
MCH: 27.8 pg (ref 26.0–34.0)
MCHC: 31.5 g/dL (ref 30.0–36.0)
MCV: 88.2 fL (ref 80.0–100.0)
Monocytes Absolute: 0.7 10*3/uL (ref 0.1–1.0)
Monocytes Relative: 12 %
Neutro Abs: 4.4 10*3/uL (ref 1.7–7.7)
Neutrophils Relative %: 75 %
Platelets: 184 10*3/uL (ref 150–400)
RBC: 4.93 MIL/uL (ref 3.87–5.11)
RDW: 15.9 % — ABNORMAL HIGH (ref 11.5–15.5)
WBC: 5.8 10*3/uL (ref 4.0–10.5)
nRBC: 0 % (ref 0.0–0.2)

## 2019-10-17 LAB — BASIC METABOLIC PANEL
Anion gap: 12 (ref 5–15)
BUN: 25 mg/dL — ABNORMAL HIGH (ref 8–23)
CO2: 29 mmol/L (ref 22–32)
Calcium: 9.2 mg/dL (ref 8.9–10.3)
Chloride: 101 mmol/L (ref 98–111)
Creatinine, Ser: 1.59 mg/dL — ABNORMAL HIGH (ref 0.44–1.00)
GFR calc Af Amer: 38 mL/min — ABNORMAL LOW (ref >60)
GFR calc non Af Amer: 33 mL/min — ABNORMAL LOW (ref >60)
Glucose, Bld: 85 mg/dL (ref 70–99)
Potassium: 3.4 mmol/L — ABNORMAL LOW (ref 3.5–5.1)
Sodium: 142 mmol/L (ref 135–145)

## 2019-10-17 LAB — GLUCOSE, CAPILLARY: Glucose-Capillary: 89 mg/dL (ref 70–99)

## 2019-10-17 MED ORDER — HYDRALAZINE HCL 10 MG PO TABS: 5.0000 mg | ORAL_TABLET | Freq: Three times a day (TID) | ORAL | Status: DC

## 2019-10-17 MED ORDER — NAPHAZOLINE-GLYCERIN 0.012-0.2 % OP SOLN
1.0000 [drp] | Freq: Four times a day (QID) | OPHTHALMIC | Status: DC | PRN
  Administered 2019-10-18 (×2): 1 [drp] via OPHTHALMIC
  Administered 2019-10-19 (×2): 2 [drp] via OPHTHALMIC
  Filled 2019-10-17 (×2): qty 15

## 2019-10-17 MED ORDER — TORSEMIDE 20 MG PO TABS
40.0000 mg | ORAL_TABLET | Freq: Every day | ORAL | Status: DC
  Administered 2019-10-18 – 2019-10-21 (×4): 40 mg via ORAL
  Filled 2019-10-17 (×4): qty 2

## 2019-10-17 NOTE — Plan of Care (Signed)
  Problem: Education: Goal: Knowledge of General Education information will improve Description Including pain rating scale, medication(s)/side effects and non-pharmacologic comfort measures Outcome: Progressing   

## 2019-10-17 NOTE — TOC Initial Note (Addendum)
Transition of Care [facility]) - Initial/Assessment Note    Patient Details  Name: Deborah Ho MRN: [id number] Date of Birth: 03-03-1951  Transition of Care [facility]) CM/SW Contact:    Leone Haven, RN Phone Number: 10/17/2019, 4:07 PM  Clinical Narrative:                 From home alone, she states she will need assistance with transportation at discharge, she does not want outpatient palliative services, NCM spoke with [place] with HF team to see if patient would be a candidate for  Paramedicine. Patient states she would like to get some information on frozen foods and baked foods to help her prepare her foods. She has a scale at home, her pcp is the [facility]. NCM offered choice for [facility] for CHF disease management. She states she has no preference. NCM left message with Tiffany with [facility].  Awaiting call back. NCM received call from Tiffany with [facility], she can take referral for [facility] with soc of Friday.  Will need HHRN order prior to discharge.  Expected Discharge Plan: Home w Home Health Services Barriers to Discharge: No Barriers Identified   Patient Goals and CMS Choice Patient states their goals for this hospitalization and ongoing recovery are:: get better CMS Medicare.gov Compare Post Acute Care list provided to:: Patient Choice offered to / list presented to : Patient  Expected Discharge Plan and Services Expected Discharge Plan: Home w Home Health Services In-house Referral: NA Discharge Planning Services: CM Consult Post Acute Care Choice: Home Health Living arrangements for the past 2 months: Apartment                                      Prior Living Arrangements/Services Living arrangements for the past 2 months: Apartment Lives with:: Self Patient language and need for interpreter reviewed:: Yes Do you feel safe going back to the place where you live?: Yes      Need for Family Participation in Patient Care: No (Comment) Care giver  support system in place?: No (comment)   Criminal Activity/Legal Involvement Pertinent to Current Situation/Hospitalization: No - Comment as needed  Activities of Daily Living Home Assistive Devices/Equipment: None ADL Screening (condition at time of admission) Patient's cognitive ability adequate to safely complete daily activities?: Yes Is the patient deaf or have difficulty hearing?: No Does the patient have difficulty seeing, even when wearing glasses/contacts?: No Does the patient have difficulty concentrating, remembering, or making decisions?: No Patient able to express need for assistance with ADLs?: Yes Does the patient have difficulty dressing or bathing?: No Independently performs ADLs?: Yes (appropriate for developmental age) Does the patient have difficulty walking or climbing stairs?: No Weakness of Legs: Both Weakness of Arms/Hands: Both  Permission Sought/Granted                  Emotional Assessment Appearance:: Appears stated age Attitude/Demeanor/Rapport: Engaged Affect (typically observed): Appropriate Orientation: : Oriented to Self, Oriented to Place, Oriented to  Time, Oriented to Situation Alcohol / Substance Use: Not Applicable Psych Involvement: No (comment)  Admission diagnosis:  Other ascites [R18.8] Acute on chronic systolic congestive heart failure (HCC) [I50.23] Patient Active Problem List   Diagnosis Date Noted  . Palliative care by specialist   . Goals of care, counseling/discussion   . Acute on chronic diastolic CHF (congestive heart failure) (HCC) 10/12/2019  . Acute systolic CHF (congestive  heart failure) ([place]) 10/11/2019  . Vitamin D deficiency 09/07/2019  . Obesity with body mass index 30 or greater 09/07/2019  . Abdominal pain 12/16/2018  . Acute CHF (congestive heart failure) ([place]) 12/15/2018  . Acute exacerbation of CHF (congestive heart failure) ([place]) 11/28/2018  . Acute on chronic systolic CHF (congestive heart failure) ([place])  11/27/2018  . Acute on chronic combined systolic and diastolic CHF (congestive heart failure) ([place]) 04/02/2017  . Noncompliance with medication regimen 04/02/2017  . Intractable nausea and vomiting 02/18/2017  . Hyperlipidemia   . GERD (gastroesophageal reflux disease)   . CHF (congestive heart failure) ([place])   . Cellulitis of left lower extremity   . Cough productive of clear sputum   . Cellulitis of left lower leg   . Hypokalemia 01/28/2017  . Peripheral edema   . Abdominal distension 06/26/2016  . Anasarca 06/26/2016  . Chronic systolic heart failure ([place]) 02/20/2016  . CKD (chronic kidney disease), stage III   . Adjustment disorder with mixed anxiety and depressed mood 02/28/2015  . Pleural effusion on right   . Essential hypertension   . Angioedema of lips 08/17/2014  . Ischemic cardiomyopathy 08/13/2014  . Coronary atherosclerosis of native coronary artery 05/10/2014  . History of ST elevation myocardial infarction (STEMI) 05/09/2014  . HLD (hyperlipidemia) 05/06/2014  . Tobacco abuse 05/06/2014  . Cardiomyopathy, ischemic 05/06/2014  . Elevated LFTs 05/06/2014   PCP:  [facility] Pharmacy:   [place] ([place]), [place] - [address] [place] [address] Phone: [phone] Fax: [place], [place] - [address] [place] [address] Phone: [phone] Fax: [phone]     Social Determinants of Health (SDOH) Interventions    Readmission Risk Interventions Readmission Risk Prevention Plan 10/17/2019 04/27/2019  Transportation Screening Complete Complete  PCP or Specialist Appt within 3-5 Days - Complete  HRI or [place] Complete Complete  Social Work Consult for [place] Planning/Counseling Complete Complete  Palliative Care Screening Complete Not Applicable  Medication Review Designer, fashion/clothing) Complete Complete  Some recent data might be hidden

## 2019-10-17 NOTE — Progress Notes (Signed)
Progress Note  Patient Name: Deborah Ho Date of Encounter: 10/17/2019  Primary Cardiologist: Peter Martinique, MD   Subjective   Breathing back to normal. Still have LE edema and abdominal swelling.   Inpatient Medications    Scheduled Meds: . atorvastatin  80 mg Oral q1800  . carvedilol  3.125 mg Oral BID WC  . enoxaparin (LOVENOX) injection  40 mg Subcutaneous Q24H  . ferrous sulfate  325 mg Oral 3 times weekly  . furosemide  40 mg Intravenous BID  . hydrALAZINE  10 mg Oral TID  . isosorbide mononitrate  60 mg Oral Daily  . loratadine  10 mg Oral Daily  . potassium chloride  20 mEq Oral Daily  . sodium chloride flush  3 mL Intravenous Q12H  . spironolactone  25 mg Oral Daily  . ticagrelor  60 mg Oral BID   Continuous Infusions: . sodium chloride     PRN Meds: sodium chloride, acetaminophen, benzonatate, fluticasone, nitroGLYCERIN, ondansetron (ZOFRAN) IV, sodium chloride flush   Vital Signs    Vitals:   10/16/19 2015 10/17/19 0024 10/17/19 0444 10/17/19 0500  BP: (!) 108/58 107/66 120/63   Pulse: 80 78 81   Resp: 20 18 18    Temp: 98.1 F (36.7 C) (!) 97.5 F (36.4 C) 97.7 F (36.5 C)   TempSrc: Oral Oral Oral   SpO2: 94% 97% 98%   Weight:    96.5 kg  Height:        Intake/Output Summary (Last 24 hours) at 10/17/2019 0905 Last data filed at 10/17/2019 1610 Gross per 24 hour  Intake 733 ml  Output 3700 ml  Net -2967 ml   Last 3 Weights 10/17/2019 10/16/2019 10/15/2019  Weight (lbs) 212 lb 11.2 oz 216 lb 8 oz 221 lb 1.6 oz  Weight (kg) 96.48 kg 98.204 kg 100.29 kg      Telemetry    SR at rate of 80s - Personally Reviewed  ECG    N/A  Physical Exam   GEN: No acute distress.   Neck: No JVD Cardiac: RRR, no murmurs, rubs, or gallops.  Respiratory: Clear to auscultation bilaterally. GI: Soft, nontender, distended  MS:1+ BL LE edema; No deformity. Neuro:  Nonfocal  Psych: Normal affect   Labs    High Sensitivity Troponin:   Recent Labs   Lab 10/11/19 2023 10/12/19 0001  TROPONINIHS 17 18*      Chemistry Recent Labs  Lab 10/14/19 0843 10/15/19 0511 10/16/19 0523 10/17/19 0531  NA  --  143 144 142  K  --  3.4* 3.9 3.4*  CL  --  104 100 101  CO2  --  27 29 29   GLUCOSE  --  91 89 85  BUN  --  23 24* 25*  CREATININE  --  1.45* 1.58* 1.59*  CALCIUM  --  9.1 9.1 9.2  PROT 6.5  --   --   --   ALBUMIN 3.1*  --   --   --   AST 18  --   --   --   ALT 12  --   --   --   ALKPHOS 128*  --   --   --   BILITOT 3.6*  --   --   --   GFRNONAA  --  37* 33* 33*  GFRAA  --  43* 39* 38*  ANIONGAP  --  12 15 12      Hematology Recent Labs  Lab 10/15/19 0511 10/16/19 0523 10/17/19  0531  WBC 6.0 6.6 5.8  RBC 4.85 4.96 4.93  HGB 13.5 13.8 13.7  HCT 42.5 44.4 43.5  MCV 87.6 89.5 88.2  MCH 27.8 27.8 27.8  MCHC 31.8 31.1 31.5  RDW 15.7* 15.9* 15.9*  PLT 183 187 184    BNP Recent Labs  Lab 10/11/19 1151 10/11/19 1815  BNP 1,706.0* 2,307.0*      Radiology    No results found.  Cardiac Studies  N/A  Patient Profile        68 y.o.femalewith a hx of chronic combined systolic and diastolic Heart Failure (EF 18%), refused ICD, CAD with anterior STEMI due to early stent thrombosis for missing Brilinta and had PCIx2 to LAD 2015, ishcemic cardiomyopathy, morbid obesity, noncompliance, HTN, and HLDwho is being seen  for the evaluation ofabdominal pain and shortness of breathat the request ofDr. Ezenduka.  Assessment & Plan    1. Acute on Chronic combined systolic and diastolic Heart Failure/Ischemic cardiomyopathy - in setting of non compliance to meds, salt restriction and diet>>> Education given  - Diuresed 10L so far with -2.7L in past 24 hours - Weight down 19 lb (231>>212lb) - Hydralazine reduced to 10mg  TID (She stated that higher does lowers her blood pressure too much) - Continue coreg, spironolactone and imdur - Change IV lasix to either torsemide or furosemide per MD. I have discussed torsemide is  better choice with abdominal swelling.  - Also educated to hold hydralazine dose if SBP less 100  2. CAD - No angina - Allergic to ASA -Continue Brillinta, statin and BB  3. HTN - BP stable   For questions or updates, please contact [facility] Please consult [email or web address] for contact info under        Signed , PA  10/17/2019, 9:05 AM

## 2019-10-17 NOTE — Progress Notes (Signed)
PROGRESS NOTE    Deborah Ho  UTM:[id number] DOB: 24-Jan-1951 DOA: 10/11/2019 PCP: [facility]   Brief Narrative: Deborah McCallumis an 68 y.o.femalewith a history ofchronic HFrEF(EF 15%), HTN, HLD, CAD s/p DESx2 2015, ICM, stage IIIb CKD, tobacco abuse, non-compliance to meds and diet, poor insight to disease, presented to the ED, c/o increasing abdominal girth and worsening shortness of breath/inability to take deep breaths, remained severely dyspneic limiting mobility so recommended hospitalist admission for diuresis.  Has diuresed 11 L thus far    Assessment & Plan:   Principal Problem:   Acute on chronic systolic CHF (congestive heart failure) (HCC) Active Problems:   HLD (hyperlipidemia)   Cardiomyopathy, ischemic   Coronary atherosclerosis of native coronary artery   Essential hypertension   CKD (chronic kidney disease), stage III   Abdominal distension   Noncompliance with medication regimen   Obesity with body mass index 30 or greater   Acute on chronic diastolic CHF (congestive heart failure) (HCC)   Palliative care by specialist   Goals of care, counseling/discussion   Acute on chronic combined systolic and diastolic HF/ischemic cardiomyopathy:  -Patient was admitted with significant fluid overload, abdominal distention/shortness of breath.  -BNP 2307. Echo done on 11/2018 showed EF of 15%, grade 3 diastolic dysfunction. Cardiology was consulted. Abdominal ultrasound demonstrated interval worsening of ascites.  -She is receiving IV lasix, coreg, imdur, and spironolactone. -Continue with IV lasix.  -negative 11 L.   CAD status post PCI x2 in 2015/HLD: No complaints of chest pain. Troponin 17-->18, EKG with no acute ST changes. Monitor on telemetry.  -Continue aspirin, Brilinta, Coreg, statin  Hypertension: Continue , Imdur, spironolactone. refuse to take hydralazine.   CKD stage III: - Baseline creatinine around 1.5.  -Monitor  on IV lasix.   Obesity:   Estimated body mass index is 35.4 kg/m as calculated from the following:   Height as of this encounter: 5\' 5"  (1.651 m).   Weight as of this encounter: 96.5 kg.   DVT prophylaxis: Lovenox Code Status: full code Family Communication:care discussed with patient Disposition Plan: remain in the hospital for management HF, needs IV lasix  Consultants:   Cardiology   Subjective: Says her abdomen is less distended  Objective: Vitals:   10/17/19 0024 10/17/19 0444 10/17/19 0500 10/17/19 1038  BP: 107/66 120/63  99/72  Pulse: 78 81  80  Resp: 18 18  19   Temp: (!) 97.5 F (36.4 C) 97.7 F (36.5 C)  97.8 F (36.6 C)  TempSrc: Oral Oral  Oral  SpO2: 97% 98%  99%  Weight:   96.5 kg   Height:        Intake/Output Summary (Last 24 hours) at 10/17/2019 1049 Last data filed at 10/17/2019 1040 Gross per 24 hour  Intake 490 ml  Output 3850 ml  Net -3360 ml   Filed Weights   10/15/19 0427 10/16/19 0446 10/17/19 0500  Weight: 100.3 kg 98.2 kg 96.5 kg    Examination:  General exam: in chair Respiratory system: no increased work of breathing Gastrointestinal system: +BS, distended but soft Central nervous system: alert but poor insight Extremities: moves all 4 ext    Data Reviewed: I have personally reviewed following labs and imaging studies  CBC: Recent Labs  Lab 10/11/19 1805 10/14/19 0432 10/15/19 0511 10/16/19 0523 10/17/19 0531  WBC 5.9 5.4 6.0 6.6 5.8  NEUTROABS  --  4.2 4.7 5.1 4.4  HGB 14.6 13.6 13.5 13.8 13.7  HCT 48.5* 43.2  42.5 44.4 43.5  MCV 92.6 88.2 87.6 89.5 88.2  PLT 227 179 183 187 184   Basic Metabolic Panel: Recent Labs  Lab 10/13/19 0406 10/14/19 0432 10/15/19 0511 10/16/19 0523 10/17/19 0531  NA 145 146* 143 144 142  K 3.7 4.0 3.4* 3.9 3.4*  CL 107 106 104 100 101  CO2 23 28 27 29 29   GLUCOSE 87 88 91 89 85  BUN 24* 24* 23 24* 25*  CREATININE 1.50* 1.58* 1.45* 1.58* 1.59*  CALCIUM 9.6 9.4 9.1 9.1 9.2    GFR: Estimated Creatinine Clearance: 38.9 mL/min (A) (by C-G formula based on SCr of 1.59 mg/dL (H)). Liver Function Tests: Recent Labs  Lab 10/14/19 0843  AST 18  ALT 12  ALKPHOS 128*  BILITOT 3.6*  PROT 6.5  ALBUMIN 3.1*   No results for input(s): LIPASE, AMYLASE in the last 168 hours. No results for input(s): AMMONIA in the last 168 hours. Coagulation Profile: Recent Labs  Lab 10/14/19 0843  INR 1.2   Cardiac Enzymes: No results for input(s): CKTOTAL, CKMB, CKMBINDEX, TROPONINI in the last 168 hours. BNP (last 3 results) No results for input(s): PROBNP in the last 8760 hours. HbA1C: No results for input(s): HGBA1C in the last 72 hours. CBG: No results for input(s): GLUCAP in the last 168 hours. Lipid Profile: No results for input(s): CHOL, HDL, LDLCALC, TRIG, CHOLHDL, LDLDIRECT in the last 72 hours. Thyroid Function Tests: No results for input(s): TSH, T4TOTAL, FREET4, T3FREE, THYROIDAB in the last 72 hours. Anemia Panel: No results for input(s): VITAMINB12, FOLATE, FERRITIN, TIBC, IRON, RETICCTPCT in the last 72 hours. Sepsis Labs: No results for input(s): PROCALCITON, LATICACIDVEN in the last 168 hours.  Recent Results (from the past 240 hour(s))  SARS CORONAVIRUS 2 (TAT 6-24 HRS) Nasopharyngeal Nasopharyngeal Swab     Status: None   Collection Time: 10/13/19  7:20 AM   Specimen: Nasopharyngeal Swab  Result Value Ref Range Status   SARS Coronavirus 2 NEGATIVE NEGATIVE Final    Comment: (NOTE) SARS-CoV-2 target nucleic acids are NOT DETECTED. The SARS-CoV-2 RNA is generally detectable in upper and lower respiratory specimens during the acute phase of infection. Negative results do not preclude SARS-CoV-2 infection, do not rule out co-infections with other pathogens, and should not be used as the sole basis for treatment or other patient management decisions. Negative results must be combined with clinical observations, patient history, and epidemiological  information. The expected result is Negative. Fact Sheet for Patients: 10/15/19 Fact Sheet for Healthcare Providers: HairSlick.no This test is not yet approved or cleared by the quierodirigir.com FDA and  has been authorized for detection and/or diagnosis of SARS-CoV-2 by FDA under an Emergency Use Authorization (EUA). This EUA will remain  in effect (meaning this test can be used) for the duration of the COVID-19 declaration under Section 56 4(b)(1) of the Act, 21 U.S.C. section 360bbb-3(b)(1), unless the authorization is terminated or revoked sooner. Performed at [facility], [address], [place], [place]          Radiology Studies: No results found.      Scheduled Meds: . atorvastatin  80 mg Oral q1800  . carvedilol  3.125 mg Oral BID WC  . enoxaparin (LOVENOX) injection  40 mg Subcutaneous Q24H  . ferrous sulfate  325 mg Oral 3 times weekly  . furosemide  40 mg Intravenous BID  . hydrALAZINE  10 mg Oral TID  . isosorbide mononitrate  60 mg Oral Daily  .  loratadine  10 mg Oral Daily  . potassium chloride  20 mEq Oral Daily  . sodium chloride flush  3 mL Intravenous Q12H  . spironolactone  25 mg Oral Daily  . ticagrelor  60 mg Oral BID   Continuous Infusions: . sodium chloride       LOS: 5 days    Time spent: 25 minutes.     Geradine Girt, DO [facility]   If 7PM-7AM, please contact night-coverage [email or web address] Password [facility] 10/17/2019, 10:49 AM

## 2019-10-18 LAB — CBC WITH DIFFERENTIAL/PLATELET
Abs Immature Granulocytes: 0.03 10*3/uL (ref 0.00–0.07)
Basophils Absolute: 0 10*3/uL (ref 0.0–0.1)
Basophils Relative: 1 %
Eosinophils Absolute: 0.2 10*3/uL (ref 0.0–0.5)
Eosinophils Relative: 4 %
HCT: 42.4 % (ref 36.0–46.0)
Hemoglobin: 13.7 g/dL (ref 12.0–15.0)
Immature Granulocytes: 1 %
Lymphocytes Relative: 15 %
Lymphs Abs: 0.9 10*3/uL (ref 0.7–4.0)
MCH: 28.4 pg (ref 26.0–34.0)
MCHC: 32.3 g/dL (ref 30.0–36.0)
MCV: 88 fL (ref 80.0–100.0)
Monocytes Absolute: 0.8 10*3/uL (ref 0.1–1.0)
Monocytes Relative: 13 %
Neutro Abs: 4.1 10*3/uL (ref 1.7–7.7)
Neutrophils Relative %: 66 %
Platelets: 188 10*3/uL (ref 150–400)
RBC: 4.82 MIL/uL (ref 3.87–5.11)
RDW: 15.9 % — ABNORMAL HIGH (ref 11.5–15.5)
WBC: 6 10*3/uL (ref 4.0–10.5)
nRBC: 0 % (ref 0.0–0.2)

## 2019-10-18 LAB — BASIC METABOLIC PANEL
Anion gap: 13 (ref 5–15)
BUN: 28 mg/dL — ABNORMAL HIGH (ref 8–23)
CO2: 27 mmol/L (ref 22–32)
Calcium: 9.6 mg/dL (ref 8.9–10.3)
Chloride: 102 mmol/L (ref 98–111)
Creatinine, Ser: 1.53 mg/dL — ABNORMAL HIGH (ref 0.44–1.00)
GFR calc Af Amer: 40 mL/min — ABNORMAL LOW (ref >60)
GFR calc non Af Amer: 35 mL/min — ABNORMAL LOW (ref >60)
Glucose, Bld: 94 mg/dL (ref 70–99)
Potassium: 3.7 mmol/L (ref 3.5–5.1)
Sodium: 142 mmol/L (ref 135–145)

## 2019-10-18 NOTE — Progress Notes (Addendum)
Patient complains of swelling in feet and mild pain, currently sitting in chair with feet dangling, denies the need for tylenol. Nurse encouraged patient to transfer to bed in order to elevate lower extremities; patient refused and states "I will after the next time I use the bedside commode". Education given to patient on importance of elevating lower extremities, patient verbalizes understanding. Patient currently sitting in chair, television on, call bell within reach.

## 2019-10-18 NOTE — Progress Notes (Signed)
PROGRESS NOTE  Deborah Ho ZOX:[id number] DOB: 31-Jan-1951 DOA: 10/11/2019 PCP: [facility]  Brief History   [name] an 68 y.o.femalewith a history ofchronic HFrEF(EF 15%), HTN, HLD, CAD s/p DESx2 2015, ICM, stage IIIb CKD, tobacco abuse, non-compliance to meds and diet, poor insight to disease, presented to the ED, c/o increasing abdominal girth and worsening shortness of breath/inability to take deep breaths, remained severely dyspneic limiting mobility so recommended hospitalist admission for diuresis.  Has diuresed 11 L thus far  Consultants  . Cardiology  Procedures  . None  Antibiotics   Anti-infectives (From admission, onward)   None    .  Subjective  The patient is sitting up at bedside in a chair. She is complaining of pain in her legs from swelling.   Objective   Vitals:  Vitals:   10/18/19 1142 10/18/19 1617  BP: (!) 97/50 109/60  Pulse: 79 79  Resp: 20 16  Temp: 97.6 F (36.4 C) 97.8 F (36.6 C)  SpO2: 99% 97%   Constitutional:   The patient is awake, alert, and oriented x 3. Mild dyspnea with conversation. Respiratory:   No increased work of breathing.  No wheezes, rales, or rhonchi  No tactile fremitus Cardiovascular:   Regular rate and rhythm  No murmurs, ectopy, or gallups.  No lateral PMI. No thrills. Abdomen:   Abdomen is distended. Positive body wall edema and fluid wave.  No hernias, masses, or organomegaly  distant bowel sounds.  Musculoskeletal:   No cyanosis or clubbing  Positive for 2-3 plus pitting edema  Skin:   No rashes, lesions, ulcers  palpation of skin: no induration or nodules Neurologic:   CN 2-12 intact  Sensation all 4 extremities intact Psychiatric:   Mental status ? Mood, affect appropriate ? Orientation to person, place, time   judgment and insight appear intact  I have personally reviewed the following:   Today's Data  . Vitals, cbc, BMP   Scheduled Meds: . atorvastatin  80 mg Oral q1800  . carvedilol  3.125 mg Oral BID WC  . enoxaparin (LOVENOX) injection  40 mg Subcutaneous Q24H  . ferrous sulfate  325 mg Oral 3 times weekly  . isosorbide mononitrate  60 mg Oral Daily  . loratadine  10 mg Oral Daily  . potassium chloride  20 mEq Oral Daily  . sodium chloride flush  3 mL Intravenous Q12H  . spironolactone  25 mg Oral Daily  . ticagrelor  60 mg Oral BID  . torsemide  40 mg Oral Daily   Continuous Infusions: . sodium chloride      Principal Problem:   Acute on chronic systolic CHF (congestive heart failure) (HCC) Active Problems:   HLD (hyperlipidemia)   Cardiomyopathy, ischemic   Coronary atherosclerosis of native coronary artery   Essential hypertension   CKD (chronic kidney disease), stage III   Abdominal distension   Noncompliance with medication regimen   Obesity with body mass index 30 or greater   Acute on chronic diastolic CHF (congestive heart failure) (HCC)   Palliative care by specialist   Goals of care, counseling/discussion   LOS: 6 days   Acute on chronic combined systolic and diastolic HF/ischemic cardiomyopathy: Patient was admitted with significant fluid overload, abdominal distention/shortness of breath. BNP 2307. Echo done on 11/2018 showed EF of 15%, grade 3 diastolic dysfunction. Cardiology was consulted. Abdominal ultrasound demonstrated interval worsening of ascites. She is receiving IV lasix, coreg, hydralazine, imdur, and spironolactone. Her volume status will  be closely monitored. She may require paracentesis if ascites is not relieved with diuresis.  Monitor on telemetry. She is now 15.2 liters negative. Palliative care was consulted and confirmed this patient's desire to remain full code and to become more proactive about the management of her CHF.  CAD status post PCI x2 in 2015/HLD: No complaints of chest pain. Troponin 17-->18, EKG with no acute ST changes. Monitor on telemetry.  Continue aspirin, Brilinta, Coreg, statin. I appreciate cardiology's assistance.   Hypertension: Continue hydralazine, Imdur, spironolactone.  CKD stage III: Baseline creatinine around 1.5. Creatinine is 1.45 today. Monitor creatinine, volume status and electrolytes.  Obesity: Complicates all cares.  I have seen and examined this patient myself. I have spent 35 minutes in her evaluation and care.   DVT Prophylaxis: Lovenox CODE STATUS: Full Code Family Communication: None available Disposition: tbd

## 2019-10-18 NOTE — Progress Notes (Signed)
Pt c/o 7/10 burning, throbbing pain in bilateral lower legs and feet. BLE swollen/tight upon assessment. Tylenol given and BLE elevated onto bed while pt seated in chair. Pt educated to elevate legs as much as possible to decrease pain and swelling.

## 2019-10-18 NOTE — Progress Notes (Signed)
Progress Note  Patient Name: Deborah Ho Date of Encounter: 10/18/2019  Primary Cardiologist: Peter Martinique, MD   Subjective   Breathing is good. No CP. Continue to have feet swelling.   Inpatient Medications    Scheduled Meds: . atorvastatin  80 mg Oral q1800  . carvedilol  3.125 mg Oral BID WC  . enoxaparin (LOVENOX) injection  40 mg Subcutaneous Q24H  . ferrous sulfate  325 mg Oral 3 times weekly  . isosorbide mononitrate  60 mg Oral Daily  . loratadine  10 mg Oral Daily  . potassium chloride  20 mEq Oral Daily  . sodium chloride flush  3 mL Intravenous Q12H  . spironolactone  25 mg Oral Daily  . ticagrelor  60 mg Oral BID  . torsemide  40 mg Oral Daily   Continuous Infusions: . sodium chloride     PRN Meds: sodium chloride, acetaminophen, benzonatate, fluticasone, naphazoline-glycerin, nitroGLYCERIN, ondansetron (ZOFRAN) IV, sodium chloride flush   Vital Signs    Vitals:   10/17/19 1038 10/17/19 1154 10/17/19 1925 10/18/19 0407  BP: 99/72 (!) 104/56 120/66 118/74  Pulse: 80 79 82 83  Resp: 19  20 18   Temp: 97.8 F (36.6 C)  98.6 F (37 C) 97.6 F (36.4 C)  TempSrc: Oral   Oral  SpO2: 99%  98% 99%  Weight:    94.9 kg  Height:        Intake/Output Summary (Last 24 hours) at 10/18/2019 0916 Last data filed at 10/18/2019 0600 Gross per 24 hour  Intake 440 ml  Output 3350 ml  Net -2910 ml   Last 3 Weights 10/18/2019 10/17/2019 10/16/2019  Weight (lbs) 209 lb 4.8 oz 212 lb 11.2 oz 216 lb 8 oz  Weight (kg) 94.938 kg 96.48 kg 98.204 kg      Telemetry    NSR without significant ventricular ectopy - Personally Reviewed  ECG    NSR with poor R wave progression in anterior leads. Overall low voltage - Personally Reviewed  Physical Exam   GEN: No acute distress.   Neck: No JVD Cardiac: RRR, no murmurs, rubs, or gallops.  Respiratory: Clear to auscultation bilaterally. GI: Soft, nontender, non-distended  MS: No deformity. 1-2 + pitting edema in the  lower legs and feet Neuro:  Nonfocal  Psych: Normal affect   Labs    High Sensitivity Troponin:   Recent Labs  Lab 10/11/19 2023 10/12/19 0001  TROPONINIHS 17 18*      Chemistry Recent Labs  Lab 10/14/19 0843  10/16/19 0523 10/17/19 0531 10/18/19 0421  NA  --    < > 144 142 142  K  --    < > 3.9 3.4* 3.7  CL  --    < > 100 101 102  CO2  --    < > 29 29 27   GLUCOSE  --    < > 89 85 94  BUN  --    < > 24* 25* 28*  CREATININE  --    < > 1.58* 1.59* 1.53*  CALCIUM  --    < > 9.1 9.2 9.6  PROT 6.5  --   --   --   --   ALBUMIN 3.1*  --   --   --   --   AST 18  --   --   --   --   ALT 12  --   --   --   --   ALKPHOS 128*  --   --   --   --  BILITOT 3.6*  --   --   --   --   GFRNONAA  --    < > 33* 33* 35*  GFRAA  --    < > 39* 38* 40*  ANIONGAP  --    < > 15 12 13    < > = values in this interval not displayed.     Hematology Recent Labs  Lab 10/16/19 0523 10/17/19 0531 10/18/19 0421  WBC 6.6 5.8 6.0  RBC 4.96 4.93 4.82  HGB 13.8 13.7 13.7  HCT 44.4 43.5 42.4  MCV 89.5 88.2 88.0  MCH 27.8 27.8 28.4  MCHC 31.1 31.5 32.3  RDW 15.9* 15.9* 15.9*  PLT 187 184 188    BNP Recent Labs  Lab 10/11/19 1151 10/11/19 1815  BNP 1,706.0* 2,307.0*     DDimer No results for input(s): DDIMER in the last 168 hours.   Radiology    No results found.  Cardiac Studies   Echo 11/28/2018 LV EF: 15% Study Conclusions  - Left ventricle: The cavity size was normal. Wall thickness was   increased in a pattern of mild LVH. The estimated ejection   fraction was 15%. Diffuse hypokinesis. No mural thrombus by   Definity contrast. Doppler parameters are consistent with   restrictive left ventricular relaxation (grade 3 diastolic   dysfunction). The E/e&' ratio is >20, suggesting markedly elevated   LV filling pressure. - Mitral valve: Mildly thickened leaflets . There was moderate   regurgitation. - Left atrium: The atrium was normal in size. - Right ventricle: The  cavity size was moderately dilated. Mild   systolic dysfunction. - Right atrium: Severely dilated. - Tricuspid valve: There was moderate regurgitation. - Pulmonary arteries: PA peak pressure: 53 mm Hg (S). - Inferior vena cava: The vessel was dilated. The respirophasic   diameter changes were blunted (< 50%), consistent with elevated   central venous pressure.  Impressions:  - Compared to a prior study in 11/2017, the LVEF is unchanged at   15%. Left and right heart pressures are quite elevated.  Patient Profile     68 y.o. female with PMH of HTN, HLD, tobacco abuse, CAD, ischemic cardiomyopathy, and chronic systolic HF presented with acute CHF.  Assessment & Plan    1. Acute on chronic combined systolic and diastolic heart failure  - in the setting of noncompliance with medication, salt and diet resitrction  - didn't tolerate entresto in the past, does not want to take hydralazine due to concern of significant drop in BP.   - admission weight 231, today's weight 209, previous discharge dry weight in June 2020 was 194 lbs.   - continue spironolactone and coreg. Will continue diuresis with torsemide. She is still 15 lbs higher than her previous dry weight in June  2. CAD: continue brilinta.   3. Ischemic cardiomyopathy: issue complicated by noncompliance. She does not want to consider ICD or advanced therapy  4. HTN: continue coreg and spironolactone. Does not want to take hydralazine, intolerant of Entresto.  5. HLD: continue lipitor.      For questions or updates, please contact [facility] Please consult [email or web address] for contact info under        Signed, July, PA  10/18/2019, 9:16 AM

## 2019-10-18 NOTE — Progress Notes (Signed)
Patient refused lovenox injection, states she only takes it every other day. Medication education given, patient verbalizes understanding. Patient currently sitting in chair, television on, call bell within reach.

## 2019-10-19 LAB — CBC WITH DIFFERENTIAL/PLATELET
Abs Immature Granulocytes: 0.02 10*3/uL (ref 0.00–0.07)
Basophils Absolute: 0 10*3/uL (ref 0.0–0.1)
Basophils Relative: 1 %
Eosinophils Absolute: 0.2 10*3/uL (ref 0.0–0.5)
Eosinophils Relative: 4 %
HCT: 43.2 % (ref 36.0–46.0)
Hemoglobin: 13.5 g/dL (ref 12.0–15.0)
Immature Granulocytes: 0 %
Lymphocytes Relative: 17 %
Lymphs Abs: 1.1 10*3/uL (ref 0.7–4.0)
MCH: 27.5 pg (ref 26.0–34.0)
MCHC: 31.3 g/dL (ref 30.0–36.0)
MCV: 88 fL (ref 80.0–100.0)
Monocytes Absolute: 0.7 10*3/uL (ref 0.1–1.0)
Monocytes Relative: 11 %
Neutro Abs: 4.3 10*3/uL (ref 1.7–7.7)
Neutrophils Relative %: 67 %
Platelets: 193 10*3/uL (ref 150–400)
RBC: 4.91 MIL/uL (ref 3.87–5.11)
RDW: 16 % — ABNORMAL HIGH (ref 11.5–15.5)
WBC: 6.3 10*3/uL (ref 4.0–10.5)
nRBC: 0 % (ref 0.0–0.2)

## 2019-10-19 LAB — BASIC METABOLIC PANEL
Anion gap: 15 (ref 5–15)
BUN: 31 mg/dL — ABNORMAL HIGH (ref 8–23)
CO2: 26 mmol/L (ref 22–32)
Calcium: 9.5 mg/dL (ref 8.9–10.3)
Chloride: 101 mmol/L (ref 98–111)
Creatinine, Ser: 1.48 mg/dL — ABNORMAL HIGH (ref 0.44–1.00)
GFR calc Af Amer: 42 mL/min — ABNORMAL LOW (ref >60)
GFR calc non Af Amer: 36 mL/min — ABNORMAL LOW (ref >60)
Glucose, Bld: 95 mg/dL (ref 70–99)
Potassium: 3.4 mmol/L — ABNORMAL LOW (ref 3.5–5.1)
Sodium: 142 mmol/L (ref 135–145)

## 2019-10-19 LAB — HEPATIC FUNCTION PANEL
ALT: 24 U/L (ref 0–44)
AST: 40 U/L (ref 15–41)
Albumin: 3.4 g/dL — ABNORMAL LOW (ref 3.5–5.0)
Alkaline Phosphatase: 198 U/L — ABNORMAL HIGH (ref 38–126)
Bilirubin, Direct: 1.2 mg/dL — ABNORMAL HIGH (ref 0.0–0.2)
Indirect Bilirubin: 2.3 mg/dL — ABNORMAL HIGH (ref 0.3–0.9)
Total Bilirubin: 3.5 mg/dL — ABNORMAL HIGH (ref 0.3–1.2)
Total Protein: 7.2 g/dL (ref 6.5–8.1)

## 2019-10-19 LAB — LIPID PANEL
Cholesterol: 122 mg/dL (ref 0–200)
HDL: 38 mg/dL — ABNORMAL LOW (ref >40)
LDL Cholesterol: 70 mg/dL (ref 0–99)
Total CHOL/HDL Ratio: 3.2 RATIO
Triglycerides: 72 mg/dL (ref <150)
VLDL: 14 mg/dL (ref 0–40)

## 2019-10-19 MED ORDER — POTASSIUM CHLORIDE CRYS ER 20 MEQ PO TBCR
40.0000 meq | EXTENDED_RELEASE_TABLET | Freq: Once | ORAL | Status: AC
  Administered 2019-10-19: 40 meq via ORAL

## 2019-10-19 MED ORDER — HEPARIN (PORCINE) IN NACL 1000-0.9 UT/500ML-% IV SOLN
INTRAVENOUS | Status: AC
  Filled 2019-10-19: qty 500

## 2019-10-19 MED ORDER — HEPARIN (PORCINE) IN NACL 2000-0.9 UNIT/L-% IV SOLN
INTRAVENOUS | Status: AC
  Filled 2019-10-19: qty 1000

## 2019-10-19 MED ORDER — LIDOCAINE HCL (PF) 1 % IJ SOLN
INTRAMUSCULAR | Status: AC
  Filled 2019-10-19: qty 30

## 2019-10-19 NOTE — Progress Notes (Signed)
Nurse continues to encourage patient to transfer to bed and elevate lower extremities, patient continues to refuse and states "the bed is not comfortable". Patient continues to refuse thigh high TED hose application to lower extremities and states her "toes are still sore". Nurse offered tylenol to help with lower extremity pain before TED hose application, patient to refuses. Education given to patient on TED hose purpose, and importance of elevating lower extremities. Nurse encouraged patient to elevate feet on bed while sitting in chair, patient agrees. Patient currently sitting in chair with feet elevated on bed, call bell within reach.

## 2019-10-19 NOTE — Progress Notes (Signed)
Progress Note  Patient Name: Deborah Ho Date of Encounter: 10/19/2019  Primary Cardiologist: Peter Martinique, MD   Subjective   Breathing better today.   Inpatient Medications    Scheduled Meds: . atorvastatin  80 mg Oral q1800  . carvedilol  3.125 mg Oral BID WC  . enoxaparin (LOVENOX) injection  40 mg Subcutaneous Q24H  . ferrous sulfate  325 mg Oral 3 times weekly  . isosorbide mononitrate  60 mg Oral Daily  . loratadine  10 mg Oral Daily  . potassium chloride  20 mEq Oral Daily  . potassium chloride  40 mEq Oral Once  . sodium chloride flush  3 mL Intravenous Q12H  . spironolactone  25 mg Oral Daily  . ticagrelor  60 mg Oral BID  . torsemide  40 mg Oral Daily   Continuous Infusions: . sodium chloride     PRN Meds: sodium chloride, acetaminophen, benzonatate, fluticasone, naphazoline-glycerin, nitroGLYCERIN, ondansetron (ZOFRAN) IV, sodium chloride flush   Vital Signs    Vitals:   10/18/19 1923 10/19/19 0304 10/19/19 0512 10/19/19 0821  BP: 122/74  127/68 123/60  Pulse: 83  81 82  Resp: _0 Temp: 98.2 F (36.8 C)  97.8 F (36.6 C)   TempSrc: Oral  Oral   SpO2: 98%  97% 96%  Weight:  93.6 kg    Height:        Intake/Output Summary (Last 24 hours) at 10/19/2019 0908 Last data filed at 10/19/2019 0700 Gross per 24 hour  Intake 1080 ml  Output 3250 ml  Net -2170 ml   Last 3 Weights 10/19/2019 10/18/2019 10/17/2019  Weight (lbs) 206 lb 6.4 oz 209 lb 4.8 oz 212 lb 11.2 oz  Weight (kg) 93.622 kg 94.938 kg 96.48 kg      Telemetry    SR, PVCs - Personally Reviewed  ECG    11/25: NSR with poor R wave progression in anterior leads. Overall low voltage - Personally Reviewed  Physical Exam   General: Well developed, well nourished, female in no acute distress Head: Eyes PERRLA, Head normocephalic and atraumatic Lungs: decreased BS bilaterally to auscultation. Heart: HRRR S1 S2, without rub or gallop. Soft murmur. Upper extremity pulses are 2+ &  equal. JVD 9 cm. Abdomen: Bowel sounds are present, abdomen soft and non-tender without masses or  hernias noted. Msk: Normal strength and tone for age. Extremities: No clubbing, cyanosis, 1+ LE edema.    Skin:  No rashes or lesions noted. Neuro: Alert and oriented X 3. Psych:  Good affect, responds appropriately  Labs    High Sensitivity Troponin:   Recent Labs  Lab 10/11/19 2023 10/12/19 0001  TROPONINIHS 17 18*      Chemistry Recent Labs  Lab 10/14/19 0843  10/17/19 0531 10/18/19 0421 10/19/19 0519  NA  --    < > 142 142 142  K  --    < > 3.4* 3.7 3.4*  CL  --    < > 101 102 101  CO2  --    < > _1 GLUCOSE  --    < > 85 94 95  BUN  --    < > 25* 28* 31*  CREATININE  --    < > 1.59* 1.53* 1.48*  CALCIUM  --    < > 9.2 9.6 9.5  PROT 6.5  --   --   --   --   ALBUMIN 3.1*  --   --   --   --  AST 18  --   --   --   --   ALT 12  --   --   --   --   ALKPHOS 128*  --   --   --   --   BILITOT 3.6*  --   --   --   --   GFRNONAA  --    < > 33* 35* 36*  GFRAA  --    < > 38* 40* 42*  ANIONGAP  --    < > _0 < > = values in this interval not displayed.     Hematology Recent Labs  Lab 10/17/19 0531 10/18/19 0421 10/19/19 0519  WBC 5.8 6.0 6.3  RBC 4.93 4.82 4.91  HGB 13.7 13.7 13.5  HCT 43.5 42.4 43.2  MCV 88.2 88.0 88.0  MCH 27.8 28.4 27.5  MCHC 31.5 32.3 31.3  RDW 15.9* 15.9* 16.0*  PLT 184 188 193   Lab Results  Component Value Date   CHOL 133 04/21/2019   HDL 29 (L) 04/21/2019   LDLCALC 83 04/21/2019   TRIG 106 04/21/2019   CHOLHDL 4.6 04/21/2019     Radiology    No results found.  Cardiac Studies   Echo 11/28/2018 LV EF: 15% Study Conclusions  - Left ventricle: The cavity size was normal. Wall thickness was   increased in a pattern of mild LVH. The estimated ejection   fraction was 15%. Diffuse hypokinesis. No mural thrombus by   Definity contrast. Doppler parameters are consistent with   restrictive left ventricular  relaxation (grade 3 diastolic   dysfunction). The E/e&' ratio is >20, suggesting markedly elevated   LV filling pressure. - Mitral valve: Mildly thickened leaflets . There was moderate   regurgitation. - Left atrium: The atrium was normal in size. - Right ventricle: The cavity size was moderately dilated. Mild   systolic dysfunction. - Right atrium: Severely dilated. - Tricuspid valve: There was moderate regurgitation. - Pulmonary arteries: PA peak pressure: 53 mm Hg (S). - Inferior vena cava: The vessel was dilated. The respirophasic   diameter changes were blunted (< 50%), consistent with elevated   central venous pressure.  Impressions:  - Compared to a prior study in 11/2017, the LVEF is unchanged at   15%. Left and right heart pressures are quite elevated.  Patient Profile     68 y.o. female with PMH of HTN, HLD, tobacco abuse, CAD, ischemic cardiomyopathy, and chronic systolic HF presented with acute CHF.  Assessment & Plan    1. Acute on chronic combined systolic and diastolic heart failure - needs compliance w/ med, Na, diet>>HHRN as outpt and get her on the list for Paramedicine, if possible - did not tol Entresto or hydralazine - 04/2019 d/c wt 191, no-show or canceled office visits until 11/25, then wt was reported as 195, but on 11/26, her wt was 231 - wt down 15 lbs since admit, I/O net neg 15.9 L - IV Lasix d/c'd 12/2, now on torsemide 40 mg daily, still losing weight, but still w/ LE edema. - on Coreg 3.125 mg bid, Imdur 30 mg qd, spiro 25 mg qd - unclear what her dry wt is, but still has extra volume on exam - BUN slight trend up, Cr stable w/ diuresis  2. CAD:  - no ischemic sx - continue BB, Imdur, Lipitor 80 mg qd  3. Ischemic cardiomyopathy:  - not interested in ICD - on BB, nitrates, spiro, torsemide -  may be compliant w/ simpler regimen  4. HTN:  - BP well controlled on current rx  5. HLD:  - continue Lipitor - recheck profile and LFTs, Alk  Phos was elevated on admit     For questions or updates, please contact [place] Please consult [email or web address] for contact info under        Signed, Rosaria Ferries, PA-C  10/19/2019, 9:08 AM

## 2019-10-19 NOTE — Progress Notes (Signed)
Patient refuses to let nurse apply thigh high TED hoses; states her "toes are too sore". Nurse provided education to patient and encouraged to wear TED hose. Education given on elevating lower extremities, patient verbalizes education. Patient currently sitting in chair with lower extremities elevated on bed, call bell within reach.

## 2019-10-19 NOTE — Progress Notes (Signed)
PROGRESS NOTE  Deborah Ho SJG:[id number] DOB: 23-Sep-1951 DOA: 10/11/2019 PCP: [facility]  Brief History   Deborah Ho an 68 y.o.femalewith a history ofchronic HFrEF(EF 15%), HTN, HLD, CAD s/p DESx2 2015, ICM, stage IIIb CKD, tobacco abuse, non-compliance to meds and diet, poor insight to disease, presented to the ED, c/o increasing abdominal girth and worsening shortness of breath/inability to take deep breaths, remained severely dyspneic limiting mobility so recommended hospitalist admission for diuresis.  Has diuresed 16.6 L thus far.  Consultants  . Cardiology  Procedures  . None  Antibiotics   Anti-infectives (From admission, onward)   None     Subjective  The patient is sitting up at bedside in a chair. She states that her legs are less uncomfortable being elevated. She has refused to wear TED hose.  Objective   Vitals:  Vitals:   10/19/19 1031 10/19/19 1413  BP: 116/66 (!) 117/59  Pulse: 82 83  Resp: 14 20  Temp: 98.7 F (37.1 C) 97.9 F (36.6 C)  SpO2: 95% 96%   Constitutional:   The patient is awake, alert, and oriented x 3. Mild dyspnea with conversation. Respiratory:   No increased work of breathing.  No wheezes, rales, or rhonchi  No tactile fremitus Cardiovascular:   Regular rate and rhythm  No murmurs, ectopy, or gallups.  No lateral PMI. No thrills. Abdomen:   Abdomen is distended. Positive body wall edema and fluid wave.  No hernias, masses, or organomegaly  distant bowel sounds.  Musculoskeletal:   No cyanosis or clubbing  Positive for 2+  pitting edema  Skin:   No rashes, lesions, ulcers  palpation of skin: no induration or nodules Neurologic:   CN 2-12 intact  Sensation all 4 extremities intact Psychiatric:   Mental status ? Mood, affect appropriate ? Orientation to person, place, time   judgment and insight appear intact  I have personally reviewed the following:    Today's Data  . Vitals, cbc, BMP  Scheduled Meds: . atorvastatin  80 mg Oral q1800  . carvedilol  3.125 mg Oral BID WC  . enoxaparin (LOVENOX) injection  40 mg Subcutaneous Q24H  . ferrous sulfate  325 mg Oral 3 times weekly  . isosorbide mononitrate  60 mg Oral Daily  . loratadine  10 mg Oral Daily  . potassium chloride  20 mEq Oral Daily  . sodium chloride flush  3 mL Intravenous Q12H  . spironolactone  25 mg Oral Daily  . ticagrelor  60 mg Oral BID  . torsemide  40 mg Oral Daily   Continuous Infusions: . sodium chloride      Principal Problem:   Acute on chronic systolic CHF (congestive heart failure) (HCC) Active Problems:   HLD (hyperlipidemia)   Cardiomyopathy, ischemic   Coronary atherosclerosis of native coronary artery   Essential hypertension   CKD (chronic kidney disease), stage III   Abdominal distension   Noncompliance with medication regimen   Obesity with body mass index 30 or greater   Acute on chronic diastolic CHF (congestive heart failure) (HCC)   Palliative care by specialist   Goals of care, counseling/discussion   LOS: 7 days   Acute on chronic combined systolic and diastolic HF/ischemic cardiomyopathy: Patient was admitted with significant fluid overload, abdominal distention/shortness of breath. BNP 2307. Echo done on 11/2018 showed EF of 15%, grade 3 diastolic dysfunction. Cardiology was consulted. Abdominal ultrasound demonstrated interval worsening of ascites. She is receiving IV lasix, coreg, hydralazine, imdur, and  spironolactone. Her volume status will be closely monitored. She may require paracentesis if ascites is not relieved with diuresis.  Monitor on telemetry. She is now 15.2 liters negative. Palliative care was consulted and confirmed this patient's desire to remain full code and to become more proactive about the management of her CHF.  CAD status post PCI x2 in 2015/HLD: No complaints of chest pain. Troponin 17-->18, EKG with no acute ST  changes. Monitor on telemetry. Continue aspirin, Brilinta, Coreg, statin. I appreciate cardiology's assistance.   Hyperbilirubinemia: Longstanding, but without explanation. Possibly passive congestion. Will check MRCP. Liver disease would be consistent with the patient's persistent difficulty with lower extremity edema and ascites. She denies a history of alcohol use.  Hypertension: Continue hydralazine, Imdur, spironolactone.  CKD stage III: Baseline creatinine around 1.5. Creatinine is 1.45 today. Monitor creatinine, volume status and electrolytes.  Obesity: Complicates all cares.  I have seen and examined this patient myself. I have spent 32 minutes in her evaluation and care.   DVT Prophylaxis: Lovenox CODE STATUS: Full Code Family Communication: None available Disposition: home.

## 2019-10-19 NOTE — Progress Notes (Addendum)
  MD paged for clarification on orders. Discharge orders and MR ABD placed at same time and date.    Orders clarified with Benny Lennert, MD. Patient will not be discharged today. Home health orders in place.

## 2019-10-19 NOTE — Progress Notes (Signed)
Per CCMD, patient had a 6 beat run of v-tach. Swayze, MD currently in patient room and made aware. Patient denies chest pain or shortness of breath. Current vitals are as follows:   10/19/19 1031  Vitals  Temp 98.7 F (37.1 C)  Temp Source Oral  BP 116/66  MAP (mmHg) 80  BP Location Right Arm  BP Method Automatic  Patient Position (if appropriate) Sitting  Pulse Rate 82  Pulse Rate Source Monitor  Resp 14  Oxygen Therapy  SpO2 95 %  O2 Device Room Air  MEWS Score  MEWS RR 0  MEWS Pulse 0  MEWS Systolic 0  MEWS LOC 0  MEWS Temp 0  MEWS Score 0  MEWS Score Color Green   Patient currently sitting in chair, television on, call bell within reach.

## 2019-10-20 ENCOUNTER — Inpatient Hospital Stay (HOSPITAL_COMMUNITY): Payer: Medicare PPO

## 2019-10-20 LAB — COMPREHENSIVE METABOLIC PANEL
ALT: 24 U/L (ref 0–44)
AST: 39 U/L (ref 15–41)
Albumin: 3.2 g/dL — ABNORMAL LOW (ref 3.5–5.0)
Alkaline Phosphatase: 194 U/L — ABNORMAL HIGH (ref 38–126)
Anion gap: 13 (ref 5–15)
BUN: 34 mg/dL — ABNORMAL HIGH (ref 8–23)
CO2: 27 mmol/L (ref 22–32)
Calcium: 9.5 mg/dL (ref 8.9–10.3)
Chloride: 102 mmol/L (ref 98–111)
Creatinine, Ser: 1.47 mg/dL — ABNORMAL HIGH (ref 0.44–1.00)
GFR calc Af Amer: 42 mL/min — ABNORMAL LOW (ref >60)
GFR calc non Af Amer: 36 mL/min — ABNORMAL LOW (ref >60)
Glucose, Bld: 80 mg/dL (ref 70–99)
Potassium: 3.8 mmol/L (ref 3.5–5.1)
Sodium: 142 mmol/L (ref 135–145)
Total Bilirubin: 3.6 mg/dL — ABNORMAL HIGH (ref 0.3–1.2)
Total Protein: 6.7 g/dL (ref 6.5–8.1)

## 2019-10-20 LAB — CBC WITH DIFFERENTIAL/PLATELET
Abs Immature Granulocytes: 0.03 10*3/uL (ref 0.00–0.07)
Basophils Absolute: 0 10*3/uL (ref 0.0–0.1)
Basophils Relative: 1 %
Eosinophils Absolute: 0.2 10*3/uL (ref 0.0–0.5)
Eosinophils Relative: 3 %
HCT: 42.2 % (ref 36.0–46.0)
Hemoglobin: 13.2 g/dL (ref 12.0–15.0)
Immature Granulocytes: 1 %
Lymphocytes Relative: 17 %
Lymphs Abs: 1 10*3/uL (ref 0.7–4.0)
MCH: 27.9 pg (ref 26.0–34.0)
MCHC: 31.3 g/dL (ref 30.0–36.0)
MCV: 89.2 fL (ref 80.0–100.0)
Monocytes Absolute: 0.7 10*3/uL (ref 0.1–1.0)
Monocytes Relative: 12 %
Neutro Abs: 4 10*3/uL (ref 1.7–7.7)
Neutrophils Relative %: 66 %
Platelets: 196 10*3/uL (ref 150–400)
RBC: 4.73 MIL/uL (ref 3.87–5.11)
RDW: 16 % — ABNORMAL HIGH (ref 11.5–15.5)
WBC: 5.9 10*3/uL (ref 4.0–10.5)
nRBC: 0 % (ref 0.0–0.2)

## 2019-10-20 LAB — PROTIME-INR
INR: 1.1 (ref 0.8–1.2)
Prothrombin Time: 13.6 seconds (ref 11.4–15.2)

## 2019-10-20 LAB — LACTATE DEHYDROGENASE: LDH: 182 U/L (ref 98–192)

## 2019-10-20 MED ORDER — GADOBUTROL 1 MMOL/ML IV SOLN
10.0000 mL | Freq: Once | INTRAVENOUS | Status: AC | PRN
  Administered 2019-10-20: 10 mL via INTRAVENOUS

## 2019-10-20 NOTE — Plan of Care (Signed)
  Problem: Clinical Measurements: Goal: Will remain free from infection Outcome: Progressing   Problem: Activity: Goal: Risk for activity intolerance will decrease Outcome: Progressing   Problem: Safety: Goal: Ability to remain free from injury will improve Outcome: Progressing   

## 2019-10-20 NOTE — Progress Notes (Signed)
Brief cardiology progress note:  Not in room this am on rounds, but chart reviewed. Continue to diurese well, wt from 93.6 to 91.3 kg today. Labs stable. Spoke with paramedicine, she has been discharged from their service previously. Appreciate team working with home health to get that set up.   We will follow peripherally over the weekend, please call with questions. Would continue with current medications as ordered.  Buford Dresser, MD, PhD [facility]  [address], [place], [place] [address] [phone]

## 2019-10-20 NOTE — Progress Notes (Signed)
PROGRESS NOTE  Deborah Ho IBB:[id number] DOB: 06/08/1951 DOA: 10/11/2019 PCP: [facility]  Brief History   Deborah McCallumis an 68 y.o.femalewith a history ofchronic HFrEF(EF 15%), HTN, HLD, CAD s/p DESx2 2015, ICM, stage IIIb CKD, tobacco abuse, non-compliance to meds and diet, poor insight to disease, presented to the ED, c/o increasing abdominal girth and worsening shortness of breath/inability to take deep breaths, remained severely dyspneic limiting mobility so recommended hospitalist admission for diuresis.  Has diuresed 18.5 L thus far. Total bilirubin has remained elevated throughout this inpatient stay and has been elevated since last spring. Both direct and indirect bilirubin have been elevated. MRCP was performed today and was nondiagnostic for this issue due to massive ascites.   Consultants  . Cardiology  Procedures  . None  Antibiotics   Anti-infectives (From admission, onward)   None     Subjective  The patient is sitting up at bedside in a chair. She states that she has less pain in her legs with elevation. She has refused to wear TED hose.  Objective   Vitals:  Vitals:   10/20/19 0517 10/20/19 0925  BP: 128/79 132/77  Pulse: 86 87  Resp: 18 18  Temp: 98 F (36.7 C) (!) 97.5 F (36.4 C)  SpO2: 100% 98%   Constitutional:   The patient is awake, alert, and oriented x 3. Mild dyspnea with conversation. Respiratory:   No increased work of breathing.  No wheezes, rales, or rhonchi  No tactile fremitus Cardiovascular:   Regular rate and rhythm  No murmurs, ectopy, or gallups.  No lateral PMI. No thrills. Abdomen:   Abdomen is distended. Positive body wall edema and fluid wave.  No hernias, masses, or organomegaly  distant bowel sounds.  Musculoskeletal:   No cyanosis or clubbing  Positive for 2+ pitting edema  Skin:   No rashes, lesions, ulcers  palpation of skin: no induration or nodules Neurologic:    CN 2-12 intact  Sensation all 4 extremities intact Psychiatric:   Mental status ? Mood, affect appropriate ? Orientation to person, place, time   judgment and insight appear intact  I have personally reviewed the following:   Today's Data  . Vitals, cbc, CMP  Scheduled Meds: . atorvastatin  80 mg Oral q1800  . carvedilol  3.125 mg Oral BID WC  . enoxaparin (LOVENOX) injection  40 mg Subcutaneous Q24H  . ferrous sulfate  325 mg Oral 3 times weekly  . isosorbide mononitrate  60 mg Oral Daily  . loratadine  10 mg Oral Daily  . potassium chloride  20 mEq Oral Daily  . sodium chloride flush  3 mL Intravenous Q12H  . spironolactone  25 mg Oral Daily  . ticagrelor  60 mg Oral BID  . torsemide  40 mg Oral Daily   Continuous Infusions: . sodium chloride      Principal Problem:   Acute on chronic systolic CHF (congestive heart failure) (HCC) Active Problems:   HLD (hyperlipidemia)   Cardiomyopathy, ischemic   Coronary atherosclerosis of native coronary artery   Essential hypertension   CKD (chronic kidney disease), stage III   Abdominal distension   Noncompliance with medication regimen   Obesity with body mass index 30 or greater   Acute on chronic diastolic CHF (congestive heart failure) (HCC)   Palliative care by specialist   Goals of care, counseling/discussion   LOS: 8 days   Acute on chronic combined systolic and diastolic HF/ischemic cardiomyopathy: Patient was admitted with  significant fluid overload, abdominal distention/shortness of breath. BNP 2307. Echo done on 11/2018 showed EF of 09%, grade 3 diastolic dysfunction. Cardiology was consulted. Abdominal ultrasound demonstrated interval worsening of ascites. She is receiving IV lasix, coreg, hydralazine, imdur, and spironolactone. Her volume status will be closely monitored. She may require paracentesis if ascites is not relieved with diuresis.  Monitor on telemetry. She is now 15.2 liters negative. Palliative  care was consulted and confirmed this patient's desire to remain full code and to become more proactive about the management of her CHF.  CAD status post PCI x2 in 2015/HLD: No complaints of chest pain. Troponin 17-->18, EKG with no acute ST changes. Monitor on telemetry. Continue aspirin, Brilinta, Coreg, statin. I appreciate cardiology's assistance.   Hyperbilirubinemia: Chronic. Unknown etiology. I am concerned for intrahepatic cause as this patient also has severe ascites. Will refer to GI as outpatient for evaluation. Hemolytic anemia labs are pending.   Hypertension: Continue hydralazine, Imdur, spironolactone.  CKD stage III: Baseline creatinine around 1.5. Creatinine is 1.45 today. Monitor creatinine, volume status and electrolytes.  Obesity: Complicates all cares.  I have seen and examined this patient myself. I have spent 32 minutes in her evaluation and care.   DVT Prophylaxis: Lovenox CODE STATUS: Full Code Family Communication: None available Disposition: tbd

## 2019-10-20 NOTE — Progress Notes (Signed)
Pt NPO for MRI abd with and without contrast per Radiology.

## 2019-10-21 LAB — CBC WITH DIFFERENTIAL/PLATELET
Abs Immature Granulocytes: 0.02 10*3/uL (ref 0.00–0.07)
Basophils Absolute: 0 10*3/uL (ref 0.0–0.1)
Basophils Relative: 1 %
Eosinophils Absolute: 0.3 10*3/uL (ref 0.0–0.5)
Eosinophils Relative: 4 %
HCT: 43 % (ref 36.0–46.0)
Hemoglobin: 13.5 g/dL (ref 12.0–15.0)
Immature Granulocytes: 0 %
Lymphocytes Relative: 19 %
Lymphs Abs: 1.1 10*3/uL (ref 0.7–4.0)
MCH: 27.8 pg (ref 26.0–34.0)
MCHC: 31.4 g/dL (ref 30.0–36.0)
MCV: 88.7 fL (ref 80.0–100.0)
Monocytes Absolute: 0.6 10*3/uL (ref 0.1–1.0)
Monocytes Relative: 11 %
Neutro Abs: 3.8 10*3/uL (ref 1.7–7.7)
Neutrophils Relative %: 65 %
Platelets: 196 10*3/uL (ref 150–400)
RBC: 4.85 MIL/uL (ref 3.87–5.11)
RDW: 16 % — ABNORMAL HIGH (ref 11.5–15.5)
WBC: 5.9 10*3/uL (ref 4.0–10.5)
nRBC: 0 % (ref 0.0–0.2)

## 2019-10-21 LAB — BASIC METABOLIC PANEL
Anion gap: 13 (ref 5–15)
BUN: 32 mg/dL — ABNORMAL HIGH (ref 8–23)
CO2: 27 mmol/L (ref 22–32)
Calcium: 9.5 mg/dL (ref 8.9–10.3)
Chloride: 103 mmol/L (ref 98–111)
Creatinine, Ser: 1.49 mg/dL — ABNORMAL HIGH (ref 0.44–1.00)
GFR calc Af Amer: 41 mL/min — ABNORMAL LOW (ref >60)
GFR calc non Af Amer: 36 mL/min — ABNORMAL LOW (ref >60)
Glucose, Bld: 84 mg/dL (ref 70–99)
Potassium: 3.6 mmol/L (ref 3.5–5.1)
Sodium: 143 mmol/L (ref 135–145)

## 2019-10-21 MED ORDER — TORSEMIDE 20 MG PO TABS: 40.0000 mg | ORAL_TABLET | Freq: Every day | ORAL | 0 refills | Status: DC

## 2019-10-21 MED ORDER — FERROUS SULFATE 325 (65 FE) MG PO TABS: 325.0000 mg | ORAL_TABLET | ORAL | 0 refills | Status: AC

## 2019-10-21 NOTE — Plan of Care (Signed)

## 2019-10-21 NOTE — TOC Transition Note (Signed)
Transition of Care [facility]) - CM/SW Discharge Note   Patient Details  Name: Deborah Ho MRN: [id number] Date of Birth: 07-Apr-1951  [facility] CM/SW Contact:  Carles Collet, RN Phone Number: 10/21/2019, 12:24 PM   Clinical Narrative:    Notified [facility] that patient will DC today. Per handoff patient is active. No other CM needs identified.     Final next level of care: [place] Barriers to Discharge: No Barriers Identified   Patient Goals and CMS Choice Patient states their goals for this hospitalization and ongoing recovery are:: get better CMS Medicare.gov Compare Post Acute Care list provided to:: Patient Choice offered to / list presented to : Patient  Discharge Placement                       Discharge Plan and Services In-house Referral: NA Discharge Planning Services: CM Consult Post Acute Care Choice: Home Health                    HH Arranged: RN [place]: [facility] Date [place]: 10/21/19 Time [place]: 1223 Representative spoke with at [place]: [place] ([place]) Interventions     Readmission Risk Interventions Readmission Risk Prevention Plan 10/21/2019 10/17/2019 04/27/2019  Transportation Screening Complete Complete Complete  PCP or Specialist Appt within 3-5 Days Not Complete - Complete  HRI or Home Care Consult Complete Complete Complete  Social Work Consult for [place] Planning/Counseling Complete Complete Complete  Palliative Care Screening Not Applicable Complete Not Applicable  Medication Review Press photographer) Complete Complete Complete  Some recent data might be hidden

## 2019-10-22 LAB — HAPTOGLOBIN: Haptoglobin: 257 mg/dL (ref 37–355)

## 2019-10-22 NOTE — Discharge Summary (Signed)
Physician Discharge Summary  Deborah Ho BSJ:[id number] DOB: 05-21-1951 DOA: 10/11/2019  PCP: [place] date: 10/11/2019 Discharge date: 10/22/2019  Recommendations for Outpatient Follow-up:  Patient to weight herself daily. Record values and contact PCP if she gains 3 lbs in one day or 5 lbs or more in one week. Have chemistry drawn on 10/25/2019 and reported to PCP. Follow up with PCP in 7-10 days. Needs to follow up with GI regarding elevated bilirubin, alk phos, and ascites within 2 months.  Follow-up Information    [facility] up.   Specialty: Home Health Services Why: [place] information: [place] [id number] [phone]          Discharge Diagnoses:  1. Acute on chronic combined systolic and diastolic HR/ischemic cardiomyopathy 2. CAD s/p PCI x 2 3. Hyperbilirubinemia 4. Hypertension 5. CKD stage III 6. Obesity  Discharge Condition: Fair  Disposition: Home  Diet recommendation:Hear healthy  Filed Weights   10/19/19 0304 10/20/19 0517 10/21/19 0315  Weight: 93.6 kg 91.3 kg 88.8 kg    History of present illness:  Deborah Ho is a 68 y.o. female with a history of chronic HFrEF (EF 15%), HTN, HLD, CAD s/p DESx2 2015, ICM, stage IIIb CKD, and tobacco use   She had a routine follow up with cardiology this morning where she reported some shortness of breath, fluctuating weights with abdominal swelling, and recent nonadherence to torsemide. She states torsemide "made me feel like there was another person inside of me" and that she couldn't tolerate it, so didn't take it for about a month. This was replaced with lasix, sent to her pharmacy at that visit, and she went home. She did not take lasix yet, but due to increasing abdominal girth and worsening shortness of breath/inability to take deep breaths she called a taxi and presented to the ED. She was noted to be 4 lbs up from previous  EDW with significant abdominal distention confirmed to have moderate ascites per EDP bedside U/S. She remained severely dyspneic limiting mobility so cardiology was contacted and recommended hospitalist admission for diuresis. Lasix IV was given and the patient has already voided many times and reports significant improvement in abdominal swelling, still moderately short of breath with exertion.   Hospital Course:  Deborah Ho an 68 y.o.femalewith a history ofchronic HFrEF(EF 15%), HTN, HLD, CAD s/p DESx2 2015, ICM, stage IIIb CKD, tobacco abuse, non-compliance to meds and diet, poor insight to disease, presented to the ED, c/o increasing abdominal girth and worsening shortness of breath/inability to take deep breaths, remained severely dyspneic limiting mobility so recommended hospitalist admission for diuresis.Has diuresed 18.5 L thus far. Total bilirubin has remained elevated throughout this inpatient stay and has been elevated since last spring. Both direct and indirect bilirubin have been elevated. MRCP was performed today and was nondiagnostic for this issue due to massive ascites.   Today's assessment: S: The patient is resting quietly. No new complaints. O: Vitals:  Vitals:   10/20/19 2110 10/21/19 0315  BP: 129/77 120/67  Pulse: 86 85  Resp:    Temp: 98.4 F (36.9 C) 97.9 F (36.6 C)  SpO2: 97% 97%   Constitutional:  The patient is awake, alert, and oriented x 3.Mild dyspnea with conversation. Respiratory:  No increased work of breathing.  No wheezes, rales, or rhonchi  No tactile fremitus Cardiovascular:  Regular rate and rhythm  No murmurs, ectopy, or gallups.  No lateral PMI.  No thrills. Abdomen:  Abdomen isdistended. Positive body wall edema and fluid wave.  No hernias, masses, or organomegaly  distantbowel sounds.  Musculoskeletal:  No cyanosisor clubbing  Positive for 2+ pitting edema Skin:  No rashes, lesions,  ulcers  palpation of skin: no induration or nodules Neurologic:  CN 2-12 intact  Sensation all 4 extremities intact Psychiatric:  Mental status ? Mood, affect appropriate ? Orientation to person, place, time   judgment and insight appear intact  Discharge Instructions  Discharge Instructions    (HEART FAILURE PATIENTS) Call MD:  Anytime you have any of the following symptoms: 1) 3 pound weight gain in 24 hours or 5 pounds in 1 week 2) shortness of breath, with or without a dry hacking cough 3) swelling in the hands, feet or stomach 4) if you have to sleep on extra pillows at night in order to breathe.   Complete by: As directed    Activity as tolerated - No restrictions   Complete by: As directed    Activity as tolerated - No restrictions   Complete by: As directed    Call MD for:  difficulty breathing, headache or visual disturbances   Complete by: As directed    Call MD for:  persistant dizziness or light-headedness   Complete by: As directed    Call MD for:  persistant nausea and vomiting   Complete by: As directed    Call MD for:  severe uncontrolled pain   Complete by: As directed    Call MD for:  temperature >100.4   Complete by: As directed    Diet - low sodium heart healthy   Complete by: As directed    Discharge instructions   Complete by: As directed    Patient to weight herself daily. Record values and contact PCP if she gains 3 lbs in one day or 5 lbs or more in one week. Have chemistry drawn on 10/25/2019 and reported to PCP. Follow up with PCP in 7-10 days. Needs to follow up with GI regarding elevated bilirubin, alk phos, and ascites within 2 months.   Increase activity slowly   Complete by: As directed    Increase activity slowly   Complete by: As directed      Allergies as of 10/21/2019      Reactions   Aspirin Swelling   Chewable children's aspirin makes patients tongue and face swell   Effient [prasugrel] Swelling   Patient's tongue and face  swells   Entresto [sacubitril-valsartan] Other (See Comments)   dizziness   Lactose Intolerance (gi) Other (See Comments)   REACTION: stomach upset   Robitussin Dm [guaifenesin-dm] Swelling   Patient's tongue swells   Sulfa Antibiotics Swelling   Other    Had to replace "catgut" with clamps      Medication List    STOP taking these medications   furosemide 40 MG tablet Commonly known as: LASIX   hydrALAZINE 25 MG tablet Commonly known as: APRESOLINE     TAKE these medications   atorvastatin 80 MG tablet Commonly known as: LIPITOR Take 80 mg by mouth daily.   carboxymethylcellulose 0.5 % Soln Commonly known as: REFRESH PLUS Place 1 drop into both eyes 3 (three) times daily as needed (dry eyes).   carvedilol 3.125 MG tablet Commonly known as: COREG Take 1 tablet (3.125 mg total) by mouth 2 (two) times daily with a meal.   cetirizine 10 MG tablet Commonly known as: ZYRTEC Take 10 mg daily by mouth.  cholecalciferol 1000 units tablet Commonly known as: VITAMIN D Take 1,000 Units by mouth every 14 (fourteen) days. Twice a month   ferrous sulfate 325 (65 FE) MG tablet Take 1 tablet (325 mg total) by mouth 3 (three) times a week. Start taking on: October 23, 2019 What changed:   medication strength  how much to take  additional instructions   fluticasone 50 MCG/ACT nasal spray Commonly known as: FLONASE Place 1 spray into both nostrils daily as needed for allergies (congestion).   isosorbide mononitrate 30 MG 24 hr tablet Commonly known as: IMDUR Take 2 tablets (60 mg total) by mouth daily.   magnesium oxide 400 (241.3 Mg) MG tablet Commonly known as: MAG-OX Take 400 mg by mouth every 14 (fourteen) days. Twice a month   nitroGLYCERIN 0.4 MG SL tablet Commonly known as: NITROSTAT Place 0.4 mg under the tongue every 5 (five) minutes as needed for chest pain.   potassium chloride SA 20 MEQ tablet Commonly known as: KLOR-CON Take 1 tablet (20 mEq total)  by mouth daily.   spironolactone 25 MG tablet Commonly known as: ALDACTONE Take 1 tablet (25 mg total) by mouth daily.   ticagrelor 60 MG Tabs tablet Commonly known as: Brilinta Take 1 tablet (60 mg total) by mouth 2 (two) times daily.   torsemide 20 MG tablet Commonly known as: DEMADEX Take 2 tablets (40 mg total) by mouth daily.      Allergies  Allergen Reactions   Aspirin Swelling    Chewable children's aspirin makes patients tongue and face swell   Effient [Prasugrel] Swelling    Patient's tongue and face swells   Entresto [Sacubitril-Valsartan] Other (See Comments)    dizziness   Lactose Intolerance (Gi) Other (See Comments)    REACTION: stomach upset   Robitussin Dm [Guaifenesin-Dm] Swelling    Patient's tongue swells   Sulfa Antibiotics Swelling   Other     Had to replace "catgut" with clamps    The results of significant diagnostics from this hospitalization (including imaging, microbiology, ancillary and laboratory) are listed below for reference.    Significant Diagnostic Studies: Dg Chest 2 View  Result Date: 10/11/2019 CLINICAL DATA:  Shortness of breath. Congestive heart failure. Abdominal pain. EXAM: CHEST - 2 VIEW COMPARISON:  04/19/2019 FINDINGS: Chronic cardiomegaly, unchanged. Pulmonary vascularity is normal. Lungs are clear. No significant bone abnormality. Aortic atherosclerosis. IMPRESSION: Chronic cardiomegaly.  No pulmonary edema. Aortic Atherosclerosis (ICD10-I70.0). Electronically Signed   By: Lorriane Shire M.D.   On: 10/11/2019 18:36   US Abdomen Limited  Result Date: 10/14/2019 CLINICAL DATA:  Ischemic cardiomyopathy, fluid overload, abdominal distension, shortness of breath EXAM: LIMITED ABDOMEN ULTRASOUND FOR ASCITES TECHNIQUE: Limited ultrasound survey for ascites was performed in all four abdominal quadrants. COMPARISON:  04/23/2019 FINDINGS: Moderate abdominal ascites, appears slightly increased since prior study. IMPRESSION:  Marginal worsening of moderate abdominal ascites Electronically Signed   By: Lucrezia Europe M.D.   On: 10/14/2019 13:30   Mr Abdomen Mrcp Moise Boring Contast  Result Date: 10/20/2019 CLINICAL DATA:  Hyperbilirubinemia. Ascites. EXAM: MRI ABDOMEN WITHOUT AND WITH CONTRAST (INCLUDING MRCP) TECHNIQUE: Multiplanar multisequence MR imaging of the abdomen was performed both before and after the administration of intravenous contrast. Heavily T2-weighted images of the biliary and pancreatic ducts were obtained, and three-dimensional MRCP images were rendered by post processing. CONTRAST:  93m GADAVIST GADOBUTROL 1 MMOL/ML IV SOLN COMPARISON:  Ultrasound exam 10/14/2019. CT scan 12/15/2018. FINDINGS: Lower chest: Heart is enlarged. Hepatobiliary: No focal abnormality within the  liver parenchyma. Gallbladder surgically absent. No evidence for intra or extrahepatic biliary duct dilatation. MRCP sequences are markedly limited by the large volume ascites and motion artifact. No ductal stones are evident although assessment likely unreliable due to the artifact and motion. Pancreas: No focal mass lesion. No dilatation of the main duct. No intraparenchymal cyst. No peripancreatic edema. Spleen:  No splenomegaly. No focal mass lesion. Adrenals/Urinary Tract: No adrenal nodule or mass. 4.5 cm simple cyst noted upper pole right kidney. 1.7 cm simple cyst noted lower pole left kidney Stomach/Bowel: Small hiatal hernia. Stomach is nondistended. No small bowel or colonic distension within the visualized abdomen Vascular/Lymphatic: No abdominal aortic aneurysm. There is no gastrohepatic or hepatoduodenal ligament lymphadenopathy. No intraperitoneal or retroperitoneal lymphadenopathy. Other:  Large volume ascites. Musculoskeletal: No abnormal marrow enhancement within the visualized bony anatomy. IMPRESSION: 1. Large volume ascites. 2. No intra or extrahepatic biliary duct dilatation. MRCP imaging limited by breathing motion and large volume  ascites. 3. Bilateral renal cysts. 4. Small hiatal hernia. Electronically Signed   By: Misty Stanley M.D.   On: 10/20/2019 11:47    Microbiology: Recent Results (from the past 240 hour(s))  SARS CORONAVIRUS 2 (TAT 6-24 HRS) Nasopharyngeal Nasopharyngeal Swab     Status: None   Collection Time: 10/13/19  7:20 AM   Specimen: Nasopharyngeal Swab  Result Value Ref Range Status   SARS Coronavirus 2 NEGATIVE NEGATIVE Final    Comment: (NOTE) SARS-CoV-2 target nucleic acids are NOT DETECTED. The SARS-CoV-2 RNA is generally detectable in upper and lower respiratory specimens during the acute phase of infection. Negative results do not preclude SARS-CoV-2 infection, do not rule out co-infections with other pathogens, and should not be used as the sole basis for treatment or other patient management decisions. Negative results must be combined with clinical observations, patient history, and epidemiological information. The expected result is Negative. Fact Sheet for Patients: SugarRoll.be Fact Sheet for Healthcare Providers: https://www.woods-mathews.com/ This test is not yet approved or cleared by the [place] FDA and  has been authorized for detection and/or diagnosis of SARS-CoV-2 by FDA under an Emergency Use Authorization (EUA). This EUA will remain  in effect (meaning this test can be used) for the duration of the COVID-19 declaration under Section 56 4(b)(1) of the Act, 21 U.S.C. section 360bbb-3(b)(1), unless the authorization is terminated or revoked sooner. Performed at [facility], [place] [address], [place], [place] [address]      Labs: Basic Metabolic Panel: Recent Labs  Lab 10/17/19 0531 10/18/19 0421 10/19/19 0519 10/20/19 0514 10/21/19 0423  NA 142 142 142 142 143  K 3.4* 3.7 3.4* 3.8 3.6  CL 101 102 101 102 103  CO2 _0 GLUCOSE 85 94 95 80 84  BUN 25* 28* 31* 34* 32*  CREATININE 1.59* 1.53* 1.48*  1.47* 1.49*  CALCIUM 9.2 9.6 9.5 9.5 9.5   Liver Function Tests: Recent Labs  Lab 10/19/19 0519 10/20/19 0514  AST 40 39  ALT 24 24  ALKPHOS 198* 194*  BILITOT 3.5* 3.6*  PROT 7.2 6.7  ALBUMIN 3.4* 3.2*   No results for input(s): LIPASE, AMYLASE in the last 168 hours. No results for input(s): AMMONIA in the last 168 hours. CBC: Recent Labs  Lab 10/17/19 0531 10/18/19 0421 10/19/19 0519 10/20/19 0514 10/21/19 0423  WBC 5.8 6.0 6.3 5.9 5.9  NEUTROABS 4.4 4.1 4.3 4.0 3.8  HGB 13.7 13.7 13.5 13.2 13.5  HCT 43.5 42.4 43.2 42.2 43.0  MCV 88.2 88.0 88.0 89.2 88.7  PLT 184 188 193 196 196   Cardiac Enzymes: No results for input(s): CKTOTAL, CKMB, CKMBINDEX, TROPONINI in the last 168 hours. BNP: BNP (last 3 results) Recent Labs    04/20/19 0045 10/11/19 1151 10/11/19 1815  BNP 3,289.0* 1,706.0* 2,307.0*    ProBNP (last 3 results) No results for input(s): PROBNP in the last 8760 hours.  CBG: Recent Labs  Lab 10/17/19 1157  GLUCAP 89    Principal Problem:   Acute on chronic systolic CHF (congestive heart failure) (HCC) Active Problems:   HLD (hyperlipidemia)   Cardiomyopathy, ischemic   Coronary atherosclerosis of native coronary artery   Essential hypertension   CKD (chronic kidney disease), stage III   Abdominal distension   Noncompliance with medication regimen   Obesity with body mass index 30 or greater   Acute on chronic diastolic CHF (congestive heart failure) ([place])   Palliative care by specialist   Goals of care, counseling/discussion   Time coordinating discharge: 38 minutes.  Signed:        Rolf Fells, DO [facility]  10/22/2019, 7:34 AM

## 2019-11-02 NOTE — Progress Notes (Signed)
Cardiology Office Note    Date:  11/03/2019   ID:  Deborah Ho, DOB 06/04/1951, MRN [id number]  PCP:  [facility]  Cardiologist:  Dr. SwazilandJordan / Dr. Shirlee LatchMcLean - CHF clinic   No chief complaint on file.   History of Present Illness:  Deborah Ho is a 68 y.o. female with past medical history of hypertension, hyperlipidemia, tobacco abuse, CAD, ischemic cardiomyopathy, and chronic systolic heart failure. She is seen for post hospital follow up.  Patient had a history of anterior STEMI on 05/03/2014 with early in-stent thrombosis for missed dose of Brilinta, this was treated with 2 drug-eluting stents to the LAD on 05/09/2014.  Echocardiogram obtained on 05/10/2014 showed EF 45 to 50%.  However repeat echocardiogram on 08/14/2014 showed EF 15%, grade 3 DD.  Ejection fraction improved to 25% by September 2016 on echocardiogram.  Since then, her ejection fraction has been hovering between 15% to 20% on multiple echocardiogram.  Last echocardiogram was obtained during her admission for acute heart failure in January 2019, this showed EF 10 to 15%, doppler parameters consistent with restrictive physiology, moderate TR, PA peak pressure 31 mmHg.  Her last heart failure clinic visit was on November 18, 2017, her weight at the time was 231 whereas her baseline dry weight is 206 pounds.  This is significant weight gain despite increasing torsemide to 60 mg twice daily, she also refused to rechallenge Entresto.  She was directly [facility].  She was diuresed with IV Lasix and metolazone and was eventually transitioned to torsemide 80 mg twice daily.  Heart failure medication was titrated as tolerated, however patient continued to refuse aggressive titration or additional beta-blocker.  She was eventually diuresed to 16.1 L and a discharged with a new dry weight of 192 pounds.  She failed to follow-up with heart failure clinic as outpatient and ended up back in the  hospital on 03/11/2018 with weight of 243 pounds.  She was eventually discharged on 04/14/2018 with dry weight of 204 pounds.  She was discharged on Lasix 80 mg twice daily, spironolactone 25 mg daily, and metolazone 3 times weekly. Since her initial event in 2015 she has had repeated hospital admissions for CHF exacerbation. She has been repeatedly noncompliant with medical therapy and outpatient follow up.   She was admitted in June 2020 with CHF exacerbation. Seen by Heart failure service. She was markedly volume overloaded.  Diuresed with lasix drip and later transitioned to torsemide 60 mg daily. Diuresed over 45 pounds. HF medications optimized. Discharge weight 191 pounds. She has refused ICD. Diuresed with lasix drip and metolazone. Once adequately diuresed she was placed on torsemide 60 mg daily. HF medications optimized.  Continued on spironolactone 25 mg daily,Coreg 3.125 mg bid. Placed on bidil but she has no insurance coverage. Switched to hydralazine 37.5 mg tid and imdur 60 daily.   I saw her in late November in the office. This was the first office visit since she was hospitalized in June and the first I had seen her in the office in 5 years. She was not taking her diuretics and had gained a significant amount of weight. We resumed her diuretics but she returned to the hospital the following day with CHF exacerbation. She had significant ascites. She was  diuresed18.5L. DC weight was 91.3 kg. Total bilirubin has remained elevated throughout this inpatient stay and has been elevated since last spring. Both direct and indirect bilirubin have been elevated. MRCP was  performed today and was nondiagnostic for this issue due to massive ascites. Recommended follow up with primary care for her ascites and elevated LFTs. She was noted to continue to have edema and ascites but did not want to stay longer to get to dry weight. She was DC on torsemide 40 mg daily and spironolactone.   Since DC she has  done well. Weight has continued to come down. Abdomen is much softer. She seems to grasp the importance of taking her diuretics and reports compliance with this. No dyspnea.    Past Medical History:  Diagnosis Date  . CAD (coronary artery disease) 05/03/14; 05/09/14   a. anterior STEMI with early in-stent thorombosis for missed dose of Brillinta s/p PCI with DESx 2 into LAD (04/2014)  . CHF (congestive heart failure) (HCC)   . GERD (gastroesophageal reflux disease)    Probable  . HTN (hypertension)   . Hyperlipidemia   . Ischemic cardiomyopathy    a. 04/2014 ECHO with EF 45-50% b.  Repeat 2D echo 08/14/14 with EF down at 15%. Life vest placed  . Non compliance w medication regimen   . Tobacco use     Past Surgical History:  Procedure Laterality Date  . CHOLECYSTECTOMY    . CORONARY ANGIOPLASTY WITH STENT PLACEMENT  05/03/14   STEMI- stent to LAD DES- Xience alpine  . CORONARY ANGIOPLASTY WITH STENT PLACEMENT  05/09/14   STEMI- overlapping stent to LAD, pt had missed a dose of Brilinta  . LEFT HEART CATHETERIZATION WITH CORONARY ANGIOGRAM N/A 05/03/2014   Procedure: LEFT HEART CATHETERIZATION WITH CORONARY ANGIOGRAM;  Surgeon: Alexxa Sabet M SwazilandJordan, MD;  [facility];  Service: Cardiovascular;  Laterality: N/A;  . LEFT HEART CATHETERIZATION WITH CORONARY ANGIOGRAM N/A 05/09/2014   Procedure: LEFT HEART CATHETERIZATION WITH CORONARY ANGIOGRAM;  Surgeon: Corky CraftsJayadeep S Varanasi, MD;  [facility];  Service: Cardiovascular;  Laterality: N/A;  . PARTIAL HYSTERECTOMY    . PERCUTANEOUS STENT INTERVENTION  05/03/2014   Procedure: PERCUTANEOUS STENT INTERVENTION;  Surgeon: Raven Harmes M [place]azilandJordan, MD;  [facility];  Service: Cardiovascular;;  DES Prox LAD     Current Medications: Outpatient Medications Prior to Visit  Medication Sig Dispense Refill  . atorvastatin (LIPITOR) 80 MG tablet Take 80 mg by mouth daily.    . carboxymethylcellulose (REFRESH PLUS) 0.5 % SOLN Place 1 drop into both  eyes 3 (three) times daily as needed (dry eyes).    . carvedilol (COREG) 3.125 MG tablet Take 1 tablet (3.125 mg total) by mouth 2 (two) times daily with a meal. 60 tablet 1  . cetirizine (ZYRTEC) 10 MG tablet Take 10 mg daily by mouth.     . cholecalciferol (VITAMIN D) 1000 units tablet Take 1,000 Units by mouth every 14 (fourteen) days. Twice a month    . ferrous sulfate 325 (65 FE) MG tablet Take 1 tablet (325 mg total) by mouth 3 (three) times a week. 13 tablet 0  . fluticasone (FLONASE) 50 MCG/ACT nasal spray Place 1 spray into both nostrils daily as needed for allergies (congestion).     . isosorbide mononitrate (IMDUR) 30 MG 24 hr tablet Take 2 tablets (60 mg total) by mouth daily. 60 tablet 6  . magnesium oxide (MAG-OX) 400 (241.3 Mg) MG tablet Take 400 mg by mouth every 14 (fourteen) days. Twice a month    . nitroGLYCERIN (NITROSTAT) 0.4 MG SL tablet Place 0.4 mg under the tongue every 5 (five) minutes as needed for chest pain.    .Marland Kitchen  potassium chloride SA (K-DUR,KLOR-CON) 20 MEQ tablet Take 1 tablet (20 mEq total) by mouth daily. 30 tablet 0  . spironolactone (ALDACTONE) 25 MG tablet Take 1 tablet (25 mg total) by mouth daily. 30 tablet 6  . ticagrelor (BRILINTA) 60 MG TABS tablet Take 1 tablet (60 mg total) by mouth 2 (two) times daily. 60 tablet 0  . torsemide (DEMADEX) 20 MG tablet Take 2 tablets (40 mg total) by mouth daily. 30 tablet 0   No facility-administered medications prior to visit.     Allergies:   Aspirin, Effient [prasugrel], Entresto [sacubitril-valsartan], Lactose intolerance (gi), Robitussin dm [guaifenesin-dm], Sulfa antibiotics, and Other   Social History   Socioeconomic History  . Marital status: Widowed    Spouse name: Not on file  . Number of children: Not on file  . Years of education: Not on file  . Highest education level: Not on file  Occupational History  . Occupation: not employed  Tobacco Use  . Smoking status: Current Some Day Smoker    Packs/day:  0.10    Years: 0.50    Pack years: 0.05    Types: Cigarettes  . Smokeless tobacco: Current User  . Tobacco comment: down to 3 cigarettes daily (08/03/14)  Substance and Sexual Activity  . Alcohol use: No  . Drug use: No  . Sexual activity: Not on file  Other Topics Concern  . Not on file  Social History Narrative   Patient has 6 brothers and sisters and none have known coronary artery disease. She lives alone in [place], but has several brothers in the area.   Social Determinants of Health   Financial Resource Strain:   . Difficulty of Paying Living Expenses: Not on file  Food Insecurity:   . Worried About Charity fundraiser in the Last Year: Not on file  . Ran Out of Food in the Last Year: Not on file  Transportation Needs:   . Lack of Transportation (Medical): Not on file  . Lack of Transportation (Non-Medical): Not on file  Physical Activity:   . Days of Exercise per Week: Not on file  . Minutes of Exercise per Session: Not on file  Stress:   . Feeling of Stress : Not on file  Social Connections:   . Frequency of Communication with Friends and Family: Not on file  . Frequency of Social Gatherings with Friends and Family: Not on file  . Attends Religious Services: Not on file  . Active Member of Clubs or Organizations: Not on file  . Attends Archivist Meetings: Not on file  . Marital Status: Not on file     Family History:  The patient's family history includes Diabetes in her mother; Hypertension in her mother.   ROS:   Please see the history of present illness.    ROS All other systems reviewed and are negative.   PHYSICAL EXAM:   VS:  BP 136/80   Pulse 100   Ht 5\' 5"  (1.651 m)   Wt 176 lb 6.4 oz (80 kg)   SpO2 99%   BMI 29.35 kg/m    GENERAL:  WD BF in NAD HEENT:  PERRL, EOMI, sclera are clear. Oropharynx is clear. NECK:  JVD is not elevated, carotid upstroke brisk and symmetric, no bruits, no thyromegaly or adenopathy LUNGS:  Clear to  auscultation bilaterally CHEST:  Unremarkable HEART:  RRR,  PMI not displaced or sustained,S1 and S2 within normal limits, no S3, no S4: no clicks, no  rubs, no murmurs ABD:  Soft, nontender. BS +,, compared to my prior exam in November abdomen is much less distended and soft.  EXT:  2 + pulses throughout, no edema, no cyanosis no clubbing SKIN:  Warm and dry.  No rashes NEURO:  Alert and oriented x 3. Cranial nerves II through XII intact. PSYCH:  Cognitively intact    Wt Readings from Last 3 Encounters:  11/03/19 176 lb 6.4 oz (80 kg)  10/21/19 195 lb 11.2 oz (88.8 kg)  10/11/19 195 lb (88.5 kg)      Studies/Labs Reviewed:   EKG:  EKG is not ordered today.    Recent Labs: 04/21/2019: TSH 1.992 04/26/2019: Magnesium 2.0 10/11/2019: B Natriuretic Peptide 2,307.0 10/20/2019: ALT 24 10/21/2019: BUN 32; Creatinine, Ser 1.49; Hemoglobin 13.5; Platelets 196; Potassium 3.6; Sodium 143   Lipid Panel    Component Value Date/Time   CHOL 122 10/19/2019 0519   TRIG 72 10/19/2019 0519   HDL 38 (L) 10/19/2019 0519   CHOLHDL 3.2 10/19/2019 0519   VLDL 14 10/19/2019 0519   LDLCALC 70 10/19/2019 0519    Additional studies/ records that were reviewed today include:   Cath 05/03/2014 PROCEDURAL FINDINGS Hemodynamics: AO 162/105 mean 130 mm Hg LV 158/26 mm Hg  Coronary angiography: Coronary dominance: right  Left mainstem: Normal  Left anterior descending (LAD): 95% proximal LAD stenosis with TIMI 2 flow.  Ramus intermediate: moderate sized vessel. Mild disease less than 20%.  Left circumflex (LCx): The LCx has 30% proximal disease. The LCx trifurcates into 3 OM branches. After the first branch there is a 60-70% stenosis in the OM prior to the next bifurcation.   Right coronary artery (RCA): The RCA arises low in the coronary cusp and was engaged with a Williams right catheter. This demonstrates a long segment of disease with 70-80% disease proximally tapering to 80-90% in the  mid vessel.   Left ventriculography: Left ventricular systolic function is abnormal, LVEF is estimated at 35%, there is severe anterior hypokinesis with akinesis of the anterior apex and inferior apex. There is mild mitral regurgitation   PCI Note: Following the diagnostic procedure, the decision was made to proceed with PCI. Weight-based bivalirudin was given for anticoagulation. Brilinta 180 mg was given orally. Once a therapeutic ACT was achieved, a 6 [place] XBLAD 3.5 guide catheter was inserted. A prowater coronary guidewire was used to cross the lesion. The lesion was predilated with a 2.0 mm balloon. The lesion was then stented with a 3.0 x 23 mm Xience Alpine stent. The stent was postdilated with a 3.25 mm noncompliant balloon. Following PCI, there was 0% residual stenosis and TIMI-3 flow. Final angiography confirmed an excellent result. The patient tolerated the procedure well. There were no immediate procedural complications. A TR band was used for radial hemostasis. The patient was transferred to the post catheterization recovery area for further monitoring.  PCI Data: Vessel - LAD/Segment - proximal Percent Stenosis (pre) 95% TIMI-flow 2 Stent 3.0 x 23 mm Xience Alpine Percent Stenosis (post) 0% TIMI-flow (post) 3  Final Conclusions:  1. Severe 3 vessel obstructive CAD. Culprit lesion in the proximal LAD. 2. Severe LV dysfunction. 3. Successful stenting of the proximal LAD with a DES.   Recommendations:  Continue DAPT for one year. IV diuresis. BP control. May consider patient for enrollment in Complete trial for her residual disease.    Cath 05/09/2014 IMPRESSIONS:  1. Normal left main coronary artery. 2. Subacute stent thrombosis of the day stent in the  mid left anterior descending artery. Successful thrombectomy was performed with restoration of TIMI-3 flow. Intravascular ultrasound revealed a vessel diameter of about 3.5 mm. An overlapping stent was placed.  This is a 3.0 x 23, overlapping the distal edge of the prior stent. The entire stented segment was post dilated with a 3.5 x 20 noncompliant balloon. Intravascular ultrasound was performed again showing excellent stent apposition and large lumen diameter.. 3. Moderate disease in the left circumflex artery and its branches. 4. Severe disease in the right coronary artery. 5. Left ventricular systolic function not assessed. LVEDP 23 mmHg. Marland Kitchen  RECOMMENDATION:Continue dual antiplatelet therapy. IV tirofiban will be continued as well. Continue medical therapy for RCA disease. We stressed the importance of taking her medications as prescribed. Continue aggressive secondary prevention. She would benefit from cardiac rehabilitation. We'll watch in the ICU.    Echo 11/21/2017 LV EF: 10% - 15%  ------------------------------------------------------------------- Indications: CHF - 428.0.  ------------------------------------------------------------------- History: PMH: Coronary artery disease. Congestive heart failure. Cardiomyopathy of unknown etiology. PMH: Myocardial infarction. Risk factors: Current tobacco use. Hypertension. Dyslipidemia.  ------------------------------------------------------------------- Study Conclusions  - Left ventricle: The cavity size was mildly dilated. Wall thickness was normal. The estimated ejection fraction was in the range of 10% to 15%. Doppler parameters are consistent with restrictive physiology, indicative of decreased left ventricular diastolic compliance and/or increased left atrial pressure. - Mitral valve: There was mild regurgitation. - Left atrium: The atrium was mildly dilated. - Right ventricle: The cavity size was mildly dilated. Systolic function was moderately to severely reduced. - Tricuspid valve: There was moderate regurgitation. - Pulmonary arteries: PA peak pressure: 31 mm Hg  (S).  Impressions:  - Technically very limited study due to poor sound wave transmission. There severe global LV dysfunction with moderate to sevre RV dysfunction.  Echo 11/28/18: Study Conclusions  - Left ventricle: The cavity size was normal. Wall thickness was   increased in a pattern of mild LVH. The estimated ejection   fraction was 15%. Diffuse hypokinesis. No mural thrombus by   Definity contrast. Doppler parameters are consistent with   restrictive left ventricular relaxation (grade 3 diastolic   dysfunction). The E/e&' ratio is >20, suggesting markedly elevated   LV filling pressure. - Mitral valve: Mildly thickened leaflets . There was moderate   regurgitation. - Left atrium: The atrium was normal in size. - Right ventricle: The cavity size was moderately dilated. Mild   systolic dysfunction. - Right atrium: Severely dilated. - Tricuspid valve: There was moderate regurgitation. - Pulmonary arteries: PA peak pressure: 53 mm Hg (S). - Inferior vena cava: The vessel was dilated. The respirophasic   diameter changes were blunted (< 50%), consistent with elevated   central venous pressure.    ASSESSMENT:    1. Essential hypertension   2. Chronic systolic heart failure (HCC)      PLAN:  In order of problems listed above:  1.  Chronic systolic/diastolic heart failure: very good response to diuretics in the hospital and continued weight loss since DC.  Ascites is clearly better.  She seems to be taking Torsemide. Stressed importance of compliance with medication and sodium restriction. Weigh daily and let us know if it is increasing.  Otherwise continue Coreg, spironolactone, imdur and hydralazine. Will check BMET.  Follow up in 4 weeks.   2. Ischemic cardiomyopathy: EF 15-20%. Chronic.  She could not tolerate Entresto due to side effect of dizziness.    3. Hypertension: Blood pressure stable  4. Hyperlipidemia: On Lipitor 40 mg  daily  5. CAD s/p remote  anterior MI with early stent thrombosis due to noncompliance. On chronic Brilinta.   6.   Elevated LFTs/bili with ascites. Suspect mostly congestive hepatopathy.     Medication Adjustments/Labs and Tests Ordered: Current medicines are reviewed at length with the patient today.  Concerns regarding medicines are outlined above.  Medication changes, Labs and Tests ordered today are listed in the Patient Instructions below. There are no Patient Instructions on file for this visit.   Signed, Joyce Heitman [place], MD  11/03/2019 5:08 PM    [facility] [address] [place], [place], [place]  [address] Phone: [phone]; Fax: [phone]

## 2019-11-03 ENCOUNTER — Other Ambulatory Visit: Payer: Self-pay

## 2019-11-03 ENCOUNTER — Encounter: Payer: Self-pay | Admitting: Cardiology

## 2019-11-03 ENCOUNTER — Ambulatory Visit (INDEPENDENT_AMBULATORY_CARE_PROVIDER_SITE_OTHER): Payer: Medicare PPO | Admitting: Cardiology

## 2019-11-03 VITALS — BP 136/80 | HR 100 | Ht 65.0 in | Wt 176.4 lb

## 2019-11-03 DIAGNOSIS — I10 Essential (primary) hypertension: Secondary | ICD-10-CM

## 2019-11-03 DIAGNOSIS — I255 Ischemic cardiomyopathy: Secondary | ICD-10-CM | POA: Diagnosis not present

## 2019-11-03 DIAGNOSIS — I5022 Chronic systolic (congestive) heart failure: Secondary | ICD-10-CM | POA: Diagnosis not present

## 2019-11-03 DIAGNOSIS — N1832 Chronic kidney disease, stage 3b: Secondary | ICD-10-CM | POA: Diagnosis not present

## 2019-11-03 NOTE — Patient Instructions (Addendum)
Medication Instructions:  Continue same medications *If you need a refill on your cardiac medications before your next appointment, please call your pharmacy*  Lab Work: Bmet next week 12/21    Testing/Procedures: None ordered  Follow-Up: At [facility], you and your health needs are our priority.  As part of our continuing mission to provide you with exceptional heart care, we have created designated Provider Care Teams.  These Care Teams include your primary Cardiologist (physician) and Advanced Practice Providers (APPs -  Physician Assistants and Nurse Practitioners) who all work together to provide you with the care you need, when you need it.  Your next appointment:  Tuesday 12/05/19 at 3:00 pm  The format for your next appointment:  Office   Provider:  Dr.Jordan

## 2019-11-15 LAB — BASIC METABOLIC PANEL
BUN/Creatinine Ratio: 29 — ABNORMAL HIGH (ref 12–28)
BUN: 47 mg/dL — ABNORMAL HIGH (ref 8–27)
CO2: 24 mmol/L (ref 20–29)
Calcium: 10.4 mg/dL — ABNORMAL HIGH (ref 8.7–10.3)
Chloride: 100 mmol/L (ref 96–106)
Creatinine, Ser: 1.64 mg/dL — ABNORMAL HIGH (ref 0.57–1.00)
GFR calc Af Amer: 37 mL/min/{1.73_m2} — ABNORMAL LOW (ref >59)
GFR calc non Af Amer: 32 mL/min/{1.73_m2} — ABNORMAL LOW (ref >59)
Glucose: 73 mg/dL (ref 65–99)
Potassium: 5 mmol/L (ref 3.5–5.2)
Sodium: 143 mmol/L (ref 134–144)

## 2019-11-30 NOTE — Progress Notes (Signed)
Cardiology Office Note    Date:  12/05/2019   ID:  Philisha Weinel, DOB 11/19/1950, MRN [id number]  PCP:  [facility]  Cardiologist:  Dr. Martinique / Dr. Aundra Dubin - [facility]   Chief Complaint  Patient presents with  . Congestive Heart Failure    History of Present Illness:  Deborah Ho is a 69 y.o. female with past medical history of hypertension, hyperlipidemia, tobacco abuse, CAD, ischemic cardiomyopathy, and chronic systolic heart failure. She is seen for post hospital follow up.  Patient had a history of anterior STEMI on 05/03/2014 with early in-stent thrombosis for missed dose of Brilinta, this was treated with 2 drug-eluting stents to the LAD on 05/09/2014.  Echocardiogram obtained on 05/10/2014 showed EF 45 to 50%.  However repeat echocardiogram on 08/14/2014 showed EF 15%, grade 3 DD.  Ejection fraction improved to 25% by September 2016 on echocardiogram.  Since then, her ejection fraction has been hovering between 15% to 20% on multiple echocardiogram.  Last echocardiogram was obtained during her admission for acute heart failure in January 2019, this showed EF 10 to 15%, doppler parameters consistent with restrictive physiology, moderate TR, PA peak pressure 31 mmHg.  Her last heart failure clinic visit was on November 18, 2017, her weight at the time was 231 whereas her baseline dry weight is 206 pounds.  This is significant weight gain despite increasing torsemide to 60 mg twice daily, she also refused to rechallenge Entresto.  She was directly admitted to [facility].  She was diuresed with IV Lasix and metolazone and was eventually transitioned to torsemide 80 mg twice daily.  Heart failure medication was titrated as tolerated, however patient continued to refuse aggressive titration or additional beta-blocker.  She was eventually diuresed to 16.1 L and a discharged with a new dry weight of 192 pounds.  She failed to follow-up with heart failure clinic as  outpatient and ended up back in the hospital on 03/11/2018 with weight of 243 pounds.  She was eventually discharged on 04/14/2018 with dry weight of 204 pounds.  She was discharged on Lasix 80 mg twice daily, spironolactone 25 mg daily, and metolazone 3 times weekly. Since her initial event in 2015 she has had repeated hospital admissions for CHF exacerbation. She has been repeatedly noncompliant with medical therapy and outpatient follow up.   She was admitted in June 2020 with CHF exacerbation. Seen by Heart failure service. She was markedly volume overloaded.  Diuresed with lasix drip and later transitioned to torsemide 60 mg daily. Diuresed over 45 pounds. HF medications optimized. Discharge weight 191 pounds. She has refused ICD. Diuresed with lasix drip and metolazone. Once adequately diuresed she was placed on torsemide 60 mg daily. HF medications optimized.  Continued on spironolactone 25 mg daily,Coreg 3.125 mg bid. Placed on bidil but she has no insurance coverage. Switched to hydralazine 37.5 mg tid and imdur 60 daily.   I saw her in late November in the office. This was the first office visit since she was hospitalized in June and the first I had seen her in the office in 5 years. She was not taking her diuretics and had gained a significant amount of weight. We resumed her diuretics but she returned to the hospital the following day with CHF exacerbation. She had significant ascites. She was  diuresed18.5L. DC weight was 91.3 kg. Total bilirubin has remained elevated throughout this inpatient stay and has been elevated since last spring. Both direct and  indirect bilirubin have been elevated. MRCP was performed today and was nondiagnostic for this issue due to massive ascites. Recommended follow up with primary care for her ascites and elevated LFTs. She was noted to continue to have edema and ascites but did not want to stay longer to get to dry weight. She was DC on torsemide 40 mg daily and  spironolactone.   On follow up today she continues to do well. She reports compliance with diuretic therapy and weight is down. No abdominal swelling or edema. Skin is dry. No dyspnea or chest pain. Needs refills on cardiac meds. She is no longer eating TV dinners.    Past Medical History:  Diagnosis Date  . CAD (coronary artery disease) 05/03/14; 05/09/14   a. anterior STEMI with early in-stent thorombosis for missed dose of Brillinta s/p PCI with DESx 2 into LAD (04/2014)  . CHF (congestive heart failure) (HCC)   . GERD (gastroesophageal reflux disease)    Probable  . HTN (hypertension)   . Hyperlipidemia   . Ischemic cardiomyopathy    a. 04/2014 ECHO with EF 45-50% b.  Repeat 2D echo 08/14/14 with EF down at 15%. Life vest placed  . Non compliance w medication regimen   . Tobacco use     Past Surgical History:  Procedure Laterality Date  . CHOLECYSTECTOMY    . CORONARY ANGIOPLASTY WITH STENT PLACEMENT  05/03/14   STEMI- stent to LAD DES- Xience alpine  . CORONARY ANGIOPLASTY WITH STENT PLACEMENT  05/09/14   STEMI- overlapping stent to LAD, pt had missed a dose of Brilinta  . LEFT HEART CATHETERIZATION WITH CORONARY ANGIOGRAM N/A 05/03/2014   Procedure: LEFT HEART CATHETERIZATION WITH CORONARY ANGIOGRAM;  Surgeon: Brianah Hopson M [place], MD;  Location[facility] CATH LAB;  Service: Cardiovascular;  Laterality: N/A;  . LEFT HEART CATHETERIZATION WITH CORONARY ANGIOGRAM N/A 05/09/2014   Procedure: LEFT HEART CATHETERIZATION WITH CORONARY ANGIOGRAM;  Surgeon: Corky Crafts, MD;  Location[facility] CATH LAB;  Service: Cardiovascular;  Laterality: N/A;  . PARTIAL HYSTERECTOMY    . PERCUTANEOUS STENT INTERVENTION  05/03/2014   Procedure: PERCUTANEOUS STENT INTERVENTION;  Surgeon: Beza Steppe M [place], MD;  Location: [facility] CATH LAB;  Service: Cardiovascular;;  DES Prox LAD     Current Medications: Outpatient Medications Prior to Visit  Medication Sig Dispense Refill  . carboxymethylcellulose (REFRESH PLUS) 0.5 %  SOLN Place 1 drop into both eyes 3 (three) times daily as needed (dry eyes).    . cetirizine (ZYRTEC) 10 MG tablet Take 10 mg daily by mouth.     . cholecalciferol (VITAMIN D) 1000 units tablet Take 1,000 Units by mouth every 14 (fourteen) days. Twice a month    . fluticasone (FLONASE) 50 MCG/ACT nasal spray Place 1 spray into both nostrils daily as needed for allergies (congestion).     . magnesium oxide (MAG-OX) 400 (241.3 Mg) MG tablet Take 400 mg by mouth every 14 (fourteen) days. Twice a month    . nitroGLYCERIN (NITROSTAT) 0.4 MG SL tablet Place 0.4 mg under the tongue every 5 (five) minutes as needed for chest pain.    Marland Kitchen atorvastatin (LIPITOR) 80 MG tablet Take 80 mg by mouth daily.    . carvedilol (COREG) 3.125 MG tablet Take 1 tablet (3.125 mg total) by mouth 2 (two) times daily with a meal. 60 tablet 1  . isosorbide mononitrate (IMDUR) 30 MG 24 hr tablet Take 2 tablets (60 mg total) by mouth daily. 60 tablet 6  . potassium chloride SA (K-DUR,KLOR-CON)  20 MEQ tablet Take 1 tablet (20 mEq total) by mouth daily. 30 tablet 0  . spironolactone (ALDACTONE) 25 MG tablet Take 1 tablet (25 mg total) by mouth daily. 30 tablet 6  . ticagrelor (BRILINTA) 60 MG TABS tablet Take 1 tablet (60 mg total) by mouth 2 (two) times daily. 60 tablet 0  . torsemide (DEMADEX) 20 MG tablet Take 2 tablets (40 mg total) by mouth daily. 30 tablet 0  . ferrous sulfate 325 (65 FE) MG tablet Take 1 tablet (325 mg total) by mouth 3 (three) times a week. 13 tablet 0   No facility-administered medications prior to visit.     Allergies:   Aspirin, Effient [prasugrel], Entresto [sacubitril-valsartan], Lactose intolerance (gi), Robitussin dm [guaifenesin-dm], Sulfa antibiotics, and Other   Social History   Socioeconomic History  . Marital status: Widowed    Spouse name: Not on file  . Number of children: Not on file  . Years of education: Not on file  . Highest education level: Not on file  Occupational History  .  Occupation: not employed  Tobacco Use  . Smoking status: Current Some Day Smoker    Packs/day: 0.10    Years: 0.50    Pack years: 0.05    Types: Cigarettes  . Smokeless tobacco: Current User  . Tobacco comment: down to 3 cigarettes daily (08/03/14)  Substance and Sexual Activity  . Alcohol use: No  . Drug use: No  . Sexual activity: Not on file  Other Topics Concern  . Not on file  Social History Narrative   Patient has 6 brothers and sisters and none have known coronary artery disease. She lives alone in [place], but has several brothers in the area.   Social Determinants of Health   Financial Resource Strain:   . Difficulty of Paying Living Expenses: Not on file  Food Insecurity:   . Worried About Programme researcher, broadcasting/film/video in the Last Year: Not on file  . Ran Out of Food in the Last Year: Not on file  Transportation Needs:   . Lack of Transportation (Medical): Not on file  . Lack of Transportation (Non-Medical): Not on file  Physical Activity:   . Days of Exercise per Week: Not on file  . Minutes of Exercise per Session: Not on file  Stress:   . Feeling of Stress : Not on file  Social Connections:   . Frequency of Communication with Friends and Family: Not on file  . Frequency of Social Gatherings with Friends and Family: Not on file  . Attends Religious Services: Not on file  . Active Member of Clubs or Organizations: Not on file  . Attends Banker Meetings: Not on file  . Marital Status: Not on file     Family History:  The patient's family history includes Diabetes in her mother; Hypertension in her mother.   ROS:   Please see the history of present illness.    ROS All other systems reviewed and are negative.   PHYSICAL EXAM:   VS:  Pulse 92   Temp (!) 93.9 F (34.4 C)   Ht 5\' 5"  (1.651 m)   Wt 172 lb 12.8 oz (78.4 kg)   SpO2 99%   BMI 28.76 kg/m    GENERAL:  WD BF in NAD HEENT:  PERRL, EOMI, sclera are clear. Oropharynx is clear. NECK:  JVD  is not elevated, carotid upstroke brisk and symmetric, no bruits, no thyromegaly or adenopathy LUNGS:  Clear to auscultation  bilaterally CHEST:  Unremarkable HEART:  RRR,  PMI not displaced or sustained,S1 and S2 within normal limits, no S3, no S4: no clicks, no rubs, no murmurs ABD:  Soft, nontender. BS +,,  abd soft without distension. EXT:  2 + pulses throughout, no edema, no cyanosis no clubbing SKIN:  Warm and dry.  No rashes NEURO:  Alert and oriented x 3. Cranial nerves II through XII intact. PSYCH:  Cognitively intact    Wt Readings from Last 3 Encounters:  12/05/19 172 lb 12.8 oz (78.4 kg)  11/03/19 176 lb 6.4 oz (80 kg)  10/21/19 195 lb 11.2 oz (88.8 kg)      Studies/Labs Reviewed:   EKG:  EKG is not ordered today.    Recent Labs: 04/21/2019: TSH 1.992 04/26/2019: Magnesium 2.0 10/11/2019: B Natriuretic Peptide 2,307.0 10/20/2019: ALT 24 10/21/2019: Hemoglobin 13.5; Platelets 196 11/14/2019: BUN 47; Creatinine, Ser 1.64; Potassium 5.0; Sodium 143   Lipid Panel    Component Value Date/Time   CHOL 122 10/19/2019 0519   TRIG 72 10/19/2019 0519   HDL 38 (L) 10/19/2019 0519   CHOLHDL 3.2 10/19/2019 0519   VLDL 14 10/19/2019 0519   LDLCALC 70 10/19/2019 0519    Additional studies/ records that were reviewed today include:   Cath 05/03/2014 PROCEDURAL FINDINGS Hemodynamics: AO 162/105 mean 130 mm Hg LV 158/26 mm Hg  Coronary angiography: Coronary dominance: right  Left mainstem: Normal  Left anterior descending (LAD): 95% proximal LAD stenosis with TIMI 2 flow.  [place]amus intermediate: moderate sized vessel. Mild disease less than 20%.  Left circumflex (LCx): The LCx has 30% proximal disease. The LCx trifurcates into 3 OM branches. After the first branch there is a 60-70% stenosis in the OM prior to the next bifurcation.   Right coronary artery (RCA): The RCA arises low in the coronary cusp and was engaged with a Williams right catheter. This demonstrates  a long segment of disease with 70-80% disease proximally tapering to 80-90% in the mid vessel.   Left ventriculography: Left ventricular systolic function is abnormal, LVEF is estimated at 35%, there is severe anterior hypokinesis with akinesis of the anterior apex and inferior apex. There is mild mitral regurgitation   PCI Note: Following the diagnostic procedure, the decision was made to proceed with PCI. Weight-based bivalirudin was given for anticoagulation. Brilinta 180 mg was given orally. Once a therapeutic ACT was achieved, a 6 [place] XBLAD 3.5 guide catheter was inserted. A prowater coronary guidewire was used to cross the lesion. The lesion was predilated with a 2.0 mm balloon. The lesion was then stented with a 3.0 x 23 mm Xience Alpine stent. The stent was postdilated with a 3.25 mm noncompliant balloon. Following PCI, there was 0% residual stenosis and TIMI-3 flow. Final angiography confirmed an excellent result. The patient tolerated the procedure well. There were no immediate procedural complications. A TR band was used for radial hemostasis. The patient was transferred to the post catheterization recovery area for further monitoring.  PCI Data: Vessel - LAD/Segment - proximal Percent Stenosis (pre) 95% TIMI-flow 2 Stent 3.0 x 23 mm Xience Alpine Percent Stenosis (post) 0% TIMI-flow (post) 3  Final Conclusions:  1. Severe 3 vessel obstructive CAD. Culprit lesion in the proximal LAD. 2. Severe LV dysfunction. 3. Successful stenting of the proximal LAD with a DES.   Recommendations:  Continue DAPT for one year. IV diuresis. BP control. May consider patient for enrollment in Complete trial for her residual disease.    Cath 05/09/2014  IMPRESSIONS:  1. Normal left main coronary artery. 2. Subacute stent thrombosis of the day stent in the mid left anterior descending artery. Successful thrombectomy was performed with restoration of TIMI-3 flow. Intravascular  ultrasound revealed a vessel diameter of about 3.5 mm. An overlapping stent was placed. This is a 3.0 x 23, overlapping the distal edge of the prior stent. The entire stented segment was post dilated with a 3.5 x 20 noncompliant balloon. Intravascular ultrasound was performed again showing excellent stent apposition and large lumen diameter.. 3. Moderate disease in the left circumflex artery and its branches. 4. Severe disease in the right coronary artery. 5. Left ventricular systolic function not assessed. LVEDP 23 mmHg. Marland Kitchen  RECOMMENDATION:Continue dual antiplatelet therapy. IV tirofiban will be continued as well. Continue medical therapy for RCA disease. We stressed the importance of taking her medications as prescribed. Continue aggressive secondary prevention. She would benefit from cardiac rehabilitation. We'll watch in the ICU.    Echo 11/21/2017 LV EF: 10% - 15%  ------------------------------------------------------------------- Indications: CHF - 428.0.  ------------------------------------------------------------------- History: PMH: Coronary artery disease. Congestive heart failure. Cardiomyopathy of unknown etiology. PMH: Myocardial infarction. Risk factors: Current tobacco use. Hypertension. Dyslipidemia.  ------------------------------------------------------------------- Study Conclusions  - Left ventricle: The cavity size was mildly dilated. Wall thickness was normal. The estimated ejection fraction was in the range of 10% to 15%. Doppler parameters are consistent with restrictive physiology, indicative of decreased left ventricular diastolic compliance and/or increased left atrial pressure. - Mitral valve: There was mild regurgitation. - Left atrium: The atrium was mildly dilated. - Right ventricle: The cavity size was mildly dilated. Systolic function was moderately to severely reduced. - Tricuspid valve: There was moderate  regurgitation. - Pulmonary arteries: PA peak pressure: 31 mm Hg (S).  Impressions:  - Technically very limited study due to poor sound wave transmission. There severe global LV dysfunction with moderate to sevre RV dysfunction.  Echo 11/28/18: Study Conclusions  - Left ventricle: The cavity size was normal. Wall thickness was   increased in a pattern of mild LVH. The estimated ejection   fraction was 15%. Diffuse hypokinesis. No mural thrombus by   Definity contrast. Doppler parameters are consistent with   restrictive left ventricular relaxation (grade 3 diastolic   dysfunction). The E/e&' ratio is >20, suggesting markedly elevated   LV filling pressure. - Mitral valve: Mildly thickened leaflets . There was moderate   regurgitation. - Left atrium: The atrium was normal in size. - Right ventricle: The cavity size was moderately dilated. Mild   systolic dysfunction. - Right atrium: Severely dilated. - Tricuspid valve: There was moderate regurgitation. - Pulmonary arteries: PA peak pressure: 53 mm Hg (S). - Inferior vena cava: The vessel was dilated. The respirophasic   diameter changes were blunted (< 50%), consistent with elevated   central venous pressure.    ASSESSMENT:    1. Chronic systolic heart failure (HCC)   2. Ischemic cardiomyopathy   3. Essential hypertension   4. Hyperlipidemia, unspecified hyperlipidemia type   5. Stage 3b chronic kidney disease      PLAN:  In order of problems listed above:  1.  Chronic systolic/diastolic heart failure: very good response to diuretics in the hospital and continued weight loss since DC.  Ascites has resolved.  She seems to be compliant with  Torsemide. Stressed importance of compliance with medication and sodium restriction. Weigh daily and let us know if it is increasing.  Otherwise continue Coreg, spironolactone, imdur and hydralazine. Will follow up in 2  months with CMET and BNP.   2. Ischemic cardiomyopathy: EF  15-20%. Chronic.  She could not tolerate Entresto due to side effect of dizziness.    3. Hypertension: Blood pressure stable  4. Hyperlipidemia: On Lipitor 40 mg daily  5. CAD s/p remote anterior MI with early stent thrombosis due to noncompliance. On chronic Brilinta.   6.   Elevated LFTs/bili with ascites. Suspect mostly congestive hepatopathy. Will check LFTs next visit.     Medication Adjustments/Labs and Tests Ordered: Current medicines are reviewed at length with the patient today.  Concerns regarding medicines are outlined above.  Medication changes, Labs and Tests ordered today are listed in the Patient Instructions below. There are no Patient Instructions on file for this visit.   Signed, Kalven Ganim Swazil[place], MD  12/05/2019 4:33 PM    [facility] [address] [place], [place], [place]  [address] Phone: [phone]; Fax: [phone]

## 2019-12-05 ENCOUNTER — Ambulatory Visit (INDEPENDENT_AMBULATORY_CARE_PROVIDER_SITE_OTHER): Payer: Medicare PPO | Admitting: Cardiology

## 2019-12-05 ENCOUNTER — Other Ambulatory Visit: Payer: Self-pay

## 2019-12-05 ENCOUNTER — Encounter: Payer: Self-pay | Admitting: Cardiology

## 2019-12-05 VITALS — BP 129/80 | HR 92 | Temp 93.9°F | Ht 65.0 in | Wt 172.8 lb

## 2019-12-05 DIAGNOSIS — I10 Essential (primary) hypertension: Secondary | ICD-10-CM

## 2019-12-05 DIAGNOSIS — I255 Ischemic cardiomyopathy: Secondary | ICD-10-CM

## 2019-12-05 DIAGNOSIS — I5022 Chronic systolic (congestive) heart failure: Secondary | ICD-10-CM | POA: Diagnosis not present

## 2019-12-05 DIAGNOSIS — E785 Hyperlipidemia, unspecified: Secondary | ICD-10-CM | POA: Diagnosis not present

## 2019-12-05 DIAGNOSIS — N1832 Chronic kidney disease, stage 3b: Secondary | ICD-10-CM

## 2019-12-05 MED ORDER — CARVEDILOL 3.125 MG PO TABS: 3.1250 mg | ORAL_TABLET | Freq: Two times a day (BID) | ORAL | 3 refills | Status: DC

## 2019-12-05 MED ORDER — POTASSIUM CHLORIDE CRYS ER 20 MEQ PO TBCR: 20.0000 meq | EXTENDED_RELEASE_TABLET | Freq: Every day | ORAL | 3 refills | Status: DC

## 2019-12-05 MED ORDER — ATORVASTATIN CALCIUM 80 MG PO TABS: 80.0000 mg | ORAL_TABLET | Freq: Every day | ORAL | 3 refills | Status: DC

## 2019-12-05 MED ORDER — TICAGRELOR 60 MG PO TABS: 60.0000 mg | ORAL_TABLET | Freq: Two times a day (BID) | ORAL | 3 refills | Status: DC

## 2019-12-05 MED ORDER — TORSEMIDE 20 MG PO TABS: 40.0000 mg | ORAL_TABLET | Freq: Every day | ORAL | 3 refills | Status: DC

## 2019-12-05 MED ORDER — ISOSORBIDE MONONITRATE ER 30 MG PO TB24: 60.0000 mg | ORAL_TABLET | Freq: Every day | ORAL | 3 refills | Status: DC

## 2019-12-05 MED ORDER — SPIRONOLACTONE 25 MG PO TABS: 25.0000 mg | ORAL_TABLET | Freq: Every day | ORAL | 3 refills | Status: DC

## 2019-12-13 ENCOUNTER — Ambulatory Visit: Payer: Medicare PPO | Admitting: Podiatry

## 2020-02-02 NOTE — Progress Notes (Deleted)
Cardiology Office Note    Date:  02/02/2020   ID:  Deborah Ho, DOB Jul 16, 1951, MRN [id number]  PCP:  [facility]  Cardiologist:  Dr. Swaziland / Dr. Shirlee Latch - CHF clinic   No chief complaint on file.   History of Present Illness:  Deborah Ho is a 69 y.o. female with past medical history of hypertension, hyperlipidemia, tobacco abuse, CAD, ischemic cardiomyopathy, and chronic systolic heart failure. She is seen for post hospital follow up.  Patient had a history of anterior STEMI on 05/03/2014 with early in-stent thrombosis for missed dose of Brilinta, this was treated with 2 drug-eluting stents to the LAD on 05/09/2014.  Echocardiogram obtained on 05/10/2014 showed EF 45 to 50%.  However repeat echocardiogram on 08/14/2014 showed EF 15%, grade 3 DD.  Ejection fraction improved to 25% by September 2016 on echocardiogram.  Since then, her ejection fraction has been hovering between 15% to 20% on multiple echocardiogram.  Last echocardiogram was obtained during her admission for acute heart failure in January 2019, this showed EF 10 to 15%, doppler parameters consistent with restrictive physiology, moderate TR, PA peak pressure 31 mmHg.  Her last heart failure clinic visit was on November 18, 2017, her weight at the time was 231 whereas her baseline dry weight is 206 pounds.  This is significant weight gain despite increasing torsemide to 60 mg twice daily, she also refused to rechallenge Entresto.  She was directly admitted to [facility].  She was diuresed with IV Lasix and metolazone and was eventually transitioned to torsemide 80 mg twice daily.  Heart failure medication was titrated as tolerated, however patient continued to refuse aggressive titration or additional beta-blocker.  She was eventually diuresed to 16.1 L and a discharged with a new dry weight of 192 pounds.  She failed to follow-up with heart failure clinic as outpatient and ended up back in the  hospital on 03/11/2018 with weight of 243 pounds.  She was eventually discharged on 04/14/2018 with dry weight of 204 pounds.  She was discharged on Lasix 80 mg twice daily, spironolactone 25 mg daily, and metolazone 3 times weekly. Since her initial event in 2015 she has had repeated hospital admissions for CHF exacerbation. She has been repeatedly noncompliant with medical therapy and outpatient follow up.   She was admitted in June 2020 with CHF exacerbation. Seen by Heart failure service. She was markedly volume overloaded.  Diuresed with lasix drip and later transitioned to torsemide 60 mg daily. Diuresed over 45 pounds. HF medications optimized. Discharge weight 191 pounds. She has refused ICD. Diuresed with lasix drip and metolazone. Once adequately diuresed she was placed on torsemide 60 mg daily. HF medications optimized.  Continued on spironolactone 25 mg daily,Coreg 3.125 mg bid. Placed on bidil but she has no insurance coverage. Switched to hydralazine 37.5 mg tid and imdur 60 daily.   I saw her in late November in the office. This was the first office visit since she was hospitalized in June and the first I had seen her in the office in 5 years. She was not taking her diuretics and had gained a significant amount of weight. We resumed her diuretics but she returned to the hospital the following day with CHF exacerbation. She had significant ascites. She was  diuresed18.5L. DC weight was 91.3 kg. Total bilirubin has remained elevated throughout this inpatient stay and has been elevated since last spring. Both direct and indirect bilirubin have been elevated. MRCP was  performed today and was nondiagnostic for this issue due to massive ascites. Recommended follow up with primary care for her ascites and elevated LFTs. She was noted to continue to have edema and ascites but did not want to stay longer to get to dry weight. She was DC on torsemide 40 mg daily and spironolactone.   On follow up today  she continues to do well. She reports compliance with diuretic therapy and weight is down. No abdominal swelling or edema. Skin is dry. No dyspnea or chest pain. Needs refills on cardiac meds. She is no longer eating TV dinners.    Past Medical History:  Diagnosis Date  . CAD (coronary artery disease) 05/03/14; 05/09/14   a. anterior STEMI with early in-stent thorombosis for missed dose of Brillinta s/p PCI with DESx 2 into LAD (04/2014)  . CHF (congestive heart failure) (HCC)   . GERD (gastroesophageal reflux disease)    Probable  . HTN (hypertension)   . Hyperlipidemia   . Ischemic cardiomyopathy    a. 04/2014 ECHO with EF 45-50% b.  Repeat 2D echo 08/14/14 with EF down at 15%. Life vest placed  . Non compliance w medication regimen   . Tobacco use     Past Surgical History:  Procedure Laterality Date  . CHOLECYSTECTOMY    . CORONARY ANGIOPLASTY WITH STENT PLACEMENT  05/03/14   STEMI- stent to LAD DES- Xience alpine  . CORONARY ANGIOPLASTY WITH STENT PLACEMENT  05/09/14   STEMI- overlapping stent to LAD, pt had missed a dose of Brilinta  . LEFT HEART CATHETERIZATION WITH CORONARY ANGIOGRAM N/A 05/03/2014   Procedure: LEFT HEART CATHETERIZATION WITH CORONARY ANGIOGRAM;  Surgeon: Eunice Oldaker M Swaziland, MD;  Location: [facility] CATH LAB;  Service: Cardiovascular;  Laterality: N/A;  . LEFT HEART CATHETERIZATION WITH CORONARY ANGIOGRAM N/A 05/09/2014   Procedure: LEFT HEART CATHETERIZATION WITH CORONARY ANGIOGRAM;  Surgeon: Corky Crafts, MD;  Location: [facility] CATH LAB;  Service: Cardiovascular;  Laterality: N/A;  . PARTIAL HYSTERECTOMY    . PERCUTANEOUS STENT INTERVENTION  05/03/2014   Procedure: PERCUTANEOUS STENT INTERVENTION;  Surgeon: Lurline Caver M Swazil[place], MD;  Location: [facility] CATH LAB;  Service: Cardiovascular;;  DES Prox LAD     Current Medications: Outpatient Medications Prior to Visit  Medication Sig Dispense Refill  . atorvastatin (LIPITOR) 80 MG tablet Take 1 tablet (80 mg total) by mouth daily. 90  tablet 3  . carboxymethylcellulose (REFRESH PLUS) 0.5 % SOLN Place 1 drop into both eyes 3 (three) times daily as needed (dry eyes).    . carvedilol (COREG) 3.125 MG tablet Take 1 tablet (3.125 mg total) by mouth 2 (two) times daily with a meal. 180 tablet 3  . cetirizine (ZYRTEC) 10 MG tablet Take 10 mg daily by mouth.     . cholecalciferol (VITAMIN D) 1000 units tablet Take 1,000 Units by mouth every 14 (fourteen) days. Twice a month    . ferrous sulfate 325 (65 FE) MG tablet Take 1 tablet (325 mg total) by mouth 3 (three) times a week. 13 tablet 0  . fluticasone (FLONASE) 50 MCG/ACT nasal spray Place 1 spray into both nostrils daily as needed for allergies (congestion).     . isosorbide mononitrate (IMDUR) 30 MG 24 hr tablet Take 2 tablets (60 mg total) by mouth daily. 90 tablet 3  . magnesium oxide (MAG-OX) 400 (241.3 Mg) MG tablet Take 400 mg by mouth every 14 (fourteen) days. Twice a month    . nitroGLYCERIN (NITROSTAT) 0.4 MG SL tablet Place  0.4 mg under the tongue every 5 (five) minutes as needed for chest pain.    . potassium chloride SA (KLOR-CON) 20 MEQ tablet Take 1 tablet (20 mEq total) by mouth daily. 90 tablet 3  . spironolactone (ALDACTONE) 25 MG tablet Take 1 tablet (25 mg total) by mouth daily. 90 tablet 3  . ticagrelor (BRILINTA) 60 MG TABS tablet Take 1 tablet (60 mg total) by mouth 2 (two) times daily. 180 tablet 3  . torsemide (DEMADEX) 20 MG tablet Take 2 tablets (40 mg total) by mouth daily. 180 tablet 3   No facility-administered medications prior to visit.     Allergies:   Aspirin, Effient [prasugrel], Entresto [sacubitril-valsartan], Lactose intolerance (gi), Robitussin dm [guaifenesin-dm], Sulfa antibiotics, and Other   Social History   Socioeconomic History  . Marital status: Widowed    Spouse name: Not on file  . Number of children: Not on file  . Years of education: Not on file  . Highest education level: Not on file  Occupational History  . Occupation: not  employed  Tobacco Use  . Smoking status: Current Some Day Smoker    Packs/day: 0.10    Years: 0.50    Pack years: 0.05    Types: Cigarettes  . Smokeless tobacco: Current User  . Tobacco comment: down to 3 cigarettes daily (08/03/14)  Substance and Sexual Activity  . Alcohol use: No  . Drug use: No  . Sexual activity: Not on file  Other Topics Concern  . Not on file  Social History Narrative   Patient has 6 brothers and sisters and none have known coronary artery disease. She lives alone in [place], but has several brothers in the area.   Social Determinants of Health   Financial Resource Strain:   . Difficulty of Paying Living Expenses:   Food Insecurity:   . Worried About Programme researcher, broadcasting/film/video in the Last Year:   . Barista in the Last Year:   Transportation Needs:   . Freight forwarder (Medical):   Marland Kitchen Lack of Transportation (Non-Medical):   Physical Activity:   . Days of Exercise per Week:   . Minutes of Exercise per Session:   Stress:   . Feeling of Stress :   Social Connections:   . Frequency of Communication with Friends and Family:   . Frequency of Social Gatherings with Friends and Family:   . Attends Religious Services:   . Active Member of Clubs or Organizations:   . Attends Banker Meetings:   Marland Kitchen Marital Status:      Family History:  The patient's family history includes Diabetes in her mother; Hypertension in her mother.   ROS:   Please see the history of present illness.    ROS All other systems reviewed and are negative.   PHYSICAL EXAM:   VS:  There were no vitals taken for this visit.   GENERAL:  WD BF in NAD HEENT:  PERRL, EOMI, sclera are clear. Oropharynx is clear. NECK:  JVD is not elevated, carotid upstroke brisk and symmetric, no bruits, no thyromegaly or adenopathy LUNGS:  Clear to auscultation bilaterally CHEST:  Unremarkable HEART:  RRR,  PMI not displaced or sustained,S1 and S2 within normal limits, no S3, no S4:  no clicks, no rubs, no murmurs ABD:  Soft, nontender. BS +,,  abd soft without distension. EXT:  2 + pulses throughout, no edema, no cyanosis no clubbing SKIN:  Warm and dry.  No rashes  NEURO:  Alert and oriented x 3. Cranial nerves II through XII intact. PSYCH:  Cognitively intact    Wt Readings from Last 3 Encounters:  12/05/19 172 lb 12.8 oz (78.4 kg)  11/03/19 176 lb 6.4 oz (80 kg)  10/21/19 195 lb 11.2 oz (88.8 kg)      Studies/Labs Reviewed:   EKG:  EKG is not ordered today.    Recent Labs: 04/21/2019: TSH 1.992 04/26/2019: Magnesium 2.0 10/11/2019: B Natriuretic Peptide 2,307.0 10/20/2019: ALT 24 10/21/2019: Hemoglobin 13.5; Platelets 196 11/14/2019: BUN 47; Creatinine, Ser 1.64; Potassium 5.0; Sodium 143   Lipid Panel    Component Value Date/Time   CHOL 122 10/19/2019 0519   TRIG 72 10/19/2019 0519   HDL 38 (L) 10/19/2019 0519   CHOLHDL 3.2 10/19/2019 0519   VLDL 14 10/19/2019 0519   LDLCALC 70 10/19/2019 0519    Additional studies/ records that were reviewed today include:   Cath 05/03/2014 PROCEDURAL FINDINGS Hemodynamics: AO 162/105 mean 130 mm Hg LV 158/26 mm Hg  Coronary angiography: Coronary dominance: right  Left mainstem: Normal  Left anterior descending (LAD): 95% proximal LAD stenosis with TIMI 2 flow.  Ramus intermediate: moderate sized vessel. Mild disease less than 20%.  Left circumflex (LCx): The LCx has 30% proximal disease. The LCx trifurcates into 3 OM branches. After the first branch there is a 60-70% stenosis in the OM prior to the next bifurcation.   Right coronary artery (RCA): The RCA arises low in the coronary cusp and was engaged with a Williams right catheter. This demonstrates a long segment of disease with 70-80% disease proximally tapering to 80-90% in the mid vessel.   Left ventriculography: Left ventricular systolic function is abnormal, LVEF is estimated at 35%, there is severe anterior hypokinesis with akinesis of  the anterior apex and inferior apex. There is mild mitral regurgitation   PCI Note: Following the diagnostic procedure, the decision was made to proceed with PCI. Weight-based bivalirudin was given for anticoagulation. Brilinta 180 mg was given orally. Once a therapeutic ACT was achieved, a 6 [place] XBLAD 3.5 guide catheter was inserted. A prowater coronary guidewire was used to cross the lesion. The lesion was predilated with a 2.0 mm balloon. The lesion was then stented with a 3.0 x 23 mm Xience Alpine stent. The stent was postdilated with a 3.25 mm noncompliant balloon. Following PCI, there was 0% residual stenosis and TIMI-3 flow. Final angiography confirmed an excellent result. The patient tolerated the procedure well. There were no immediate procedural complications. A TR band was used for radial hemostasis. The patient was transferred to the post catheterization recovery area for further monitoring.  PCI Data: Vessel - LAD/Segment - proximal Percent Stenosis (pre) 95% TIMI-flow 2 Stent 3.0 x 23 mm Xience Alpine Percent Stenosis (post) 0% TIMI-flow (post) 3  Final Conclusions:  1. Severe 3 vessel obstructive CAD. Culprit lesion in the proximal LAD. 2. Severe LV dysfunction. 3. Successful stenting of the proximal LAD with a DES.   Recommendations:  Continue DAPT for one year. IV diuresis. BP control. May consider patient for enrollment in Complete trial for her residual disease.    Cath 05/09/2014 IMPRESSIONS:  1. Normal left main coronary artery. 2. Subacute stent thrombosis of the day stent in the mid left anterior descending artery. Successful thrombectomy was performed with restoration of TIMI-3 flow. Intravascular ultrasound revealed a vessel diameter of about 3.5 mm. An overlapping stent was placed. This is a 3.0 x 23, overlapping the distal edge of the  prior stent. The entire stented segment was post dilated with a 3.5 x 20 noncompliant balloon. Intravascular  ultrasound was performed again showing excellent stent apposition and large lumen diameter.. 3. Moderate disease in the left circumflex artery and its branches. 4. Severe disease in the right coronary artery. 5. Left ventricular systolic function not assessed. LVEDP 23 mmHg. Marland Kitchen  RECOMMENDATION:Continue dual antiplatelet therapy. IV tirofiban will be continued as well. Continue medical therapy for RCA disease. We stressed the importance of taking her medications as prescribed. Continue aggressive secondary prevention. She would benefit from cardiac rehabilitation. We'll watch in the ICU.    Echo 11/21/2017 LV EF: 10% - 15%  ------------------------------------------------------------------- Indications: CHF - 428.0.  ------------------------------------------------------------------- History: PMH: Coronary artery disease. Congestive heart failure. Cardiomyopathy of unknown etiology. PMH: Myocardial infarction. Risk factors: Current tobacco use. Hypertension. Dyslipidemia.  ------------------------------------------------------------------- Study Conclusions  - Left ventricle: The cavity size was mildly dilated. Wall thickness was normal. The estimated ejection fraction was in the range of 10% to 15%. Doppler parameters are consistent with restrictive physiology, indicative of decreased left ventricular diastolic compliance and/or increased left atrial pressure. - Mitral valve: There was mild regurgitation. - Left atrium: The atrium was mildly dilated. - Right ventricle: The cavity size was mildly dilated. Systolic function was moderately to severely reduced. - Tricuspid valve: There was moderate regurgitation. - Pulmonary arteries: PA peak pressure: 31 mm Hg (S).  Impressions:  - Technically very limited study due to poor sound wave transmission. There severe global LV dysfunction with moderate to sevre RV dysfunction.  Echo  11/28/18: Study Conclusions  - Left ventricle: The cavity size was normal. Wall thickness was   increased in a pattern of mild LVH. The estimated ejection   fraction was 15%. Diffuse hypokinesis. No mural thrombus by   Definity contrast. Doppler parameters are consistent with   restrictive left ventricular relaxation (grade 3 diastolic   dysfunction). The E/e&' ratio is >20, suggesting markedly elevated   LV filling pressure. - Mitral valve: Mildly thickened leaflets . There was moderate   regurgitation. - Left atrium: The atrium was normal in size. - Right ventricle: The cavity size was moderately dilated. Mild   systolic dysfunction. - Right atrium: Severely dilated. - Tricuspid valve: There was moderate regurgitation. - Pulmonary arteries: PA peak pressure: 53 mm Hg (S). - Inferior vena cava: The vessel was dilated. The respirophasic   diameter changes were blunted (< 50%), consistent with elevated   central venous pressure.    ASSESSMENT:    No diagnosis found.   PLAN:  In order of problems listed above:  1.  Chronic systolic/diastolic heart failure: very good response to diuretics in the hospital and continued weight loss since DC.  Ascites has resolved.  She seems to be compliant with  Torsemide. Stressed importance of compliance with medication and sodium restriction. Weigh daily and let us know if it is increasing.  Otherwise continue Coreg, spironolactone, imdur and hydralazine. Will follow up in 2 months with CMET and BNP.   2. Ischemic cardiomyopathy: EF 15-20%. Chronic.  She could not tolerate Entresto due to side effect of dizziness.    3. Hypertension: Blood pressure stable  4. Hyperlipidemia: On Lipitor 40 mg daily  5. CAD s/p remote anterior MI with early stent thrombosis due to noncompliance. On chronic Brilinta.   6.   Elevated LFTs/bili with ascites. Suspect mostly congestive hepatopathy. Will check LFTs next visit.     Medication Adjustments/Labs and  Tests Ordered: Current medicines are reviewed  at length with the patient today.  Concerns regarding medicines are outlined above.  Medication changes, Labs and Tests ordered today are listed in the Patient Instructions below. There are no Patient Instructions on file for this visit.   Signed, Marshal Eskew Swaziland, MD  02/02/2020 5:03 PM    [facility] [address] [place], [place], [place]  [address] Phone: [phone]; Fax: [phone]

## 2020-02-07 ENCOUNTER — Ambulatory Visit: Payer: Medicare PPO | Admitting: Cardiology

## 2020-02-13 ENCOUNTER — Ambulatory Visit: Payer: Medicare PPO | Admitting: Podiatry

## 2020-03-05 NOTE — Progress Notes (Deleted)
Cardiology Office Note    Date:  03/05/2020   ID:  Deborah Ho, DOB [birth year], MRN [id number]  PCP:  [facility]  Cardiologist:  Dr. Martinique / Dr. Aundra Dubin - CHF clinic   No chief complaint on file.   History of Present Illness:  Deborah Ho is a 69 y.o. female with past medical history of hypertension, hyperlipidemia, tobacco abuse, CAD, ischemic cardiomyopathy, and chronic systolic heart failure. She is seen for post hospital follow up.  Patient had a history of anterior STEMI on 05/03/2014 with early in-stent thrombosis for missed dose of Brilinta, this was treated with 2 drug-eluting stents to the LAD on 05/09/2014.  Echocardiogram obtained on 05/10/2014 showed EF 45 to 50%.  However repeat echocardiogram on 08/14/2014 showed EF 15%, grade 3 DD.  Ejection fraction improved to 25% by September 2016 on echocardiogram.  Since then, her ejection fraction has been hovering between 15% to 20% on multiple echocardiogram.  Last echocardiogram was obtained during her admission for acute heart failure in January 2019, this showed EF 10 to 15%, doppler parameters consistent with restrictive physiology, moderate TR, PA peak pressure 31 mmHg.  Her last heart failure clinic visit was on November 18, 2017, her weight at the time was 231 whereas her baseline dry weight is 206 pounds.  This is significant weight gain despite increasing torsemide to 60 mg twice daily, she also refused to rechallenge Entresto.  She was directly admitted to [facility].  She was diuresed with IV Lasix and metolazone and was eventually transitioned to torsemide 80 mg twice daily.  Heart failure medication was titrated as tolerated, however patient continued to refuse aggressive titration or additional beta-blocker.  She was eventually diuresed to 16.1 L and a discharged with a new dry weight of 192 pounds.  She failed to follow-up with heart failure clinic as outpatient and ended up back in the  hospital on 03/11/2018 with weight of 243 pounds.  She was eventually discharged on 04/14/2018 with dry weight of 204 pounds.  She was discharged on Lasix 80 mg twice daily, spironolactone 25 mg daily, and metolazone 3 times weekly. Since her initial event in 2015 she has had repeated hospital admissions for CHF exacerbation. She has been repeatedly noncompliant with medical therapy and outpatient follow up.   She was admitted in June 2020 with CHF exacerbation. Seen by Heart failure service. She was markedly volume overloaded.  Diuresed with lasix drip and later transitioned to torsemide 60 mg daily. Diuresed over 45 pounds. HF medications optimized. Discharge weight 191 pounds. She has refused ICD. Diuresed with lasix drip and metolazone. Once adequately diuresed she was placed on torsemide 60 mg daily. HF medications optimized.  Continued on spironolactone 25 mg daily,Coreg 3.125 mg bid. Placed on bidil but she has no insurance coverage. Switched to hydralazine 37.5 mg tid and imdur 60 daily.   I saw her in late November in the office. This was the first office visit since she was hospitalized in June and the first I had seen her in the office in 5 years. She was not taking her diuretics and had gained a significant amount of weight. We resumed her diuretics but she returned to the hospital the following day with CHF exacerbation. She had significant ascites. She was  diuresed18.5L. DC weight was 91.3 kg. Total bilirubin has remained elevated throughout this inpatient stay and has been elevated since last spring. Both direct and indirect bilirubin have been elevated. MRCP was  performed today and was nondiagnostic for this issue due to massive ascites. Recommended follow up with primary care for her ascites and elevated LFTs. She was noted to continue to have edema and ascites but did not want to stay longer to get to dry weight. She was DC on torsemide 40 mg daily and spironolactone.   On follow up today  she continues to do well. She reports compliance with diuretic therapy and weight is down. No abdominal swelling or edema. Skin is dry. No dyspnea or chest pain. Needs refills on cardiac meds. She is no longer eating TV dinners.    Past Medical History:  Diagnosis Date  . CAD (coronary artery disease) 05/03/14; 05/09/14   a. anterior STEMI with early in-stent thorombosis for missed dose of Brillinta s/p PCI with DESx 2 into LAD (04/2014)  . CHF (congestive heart failure) (HCC)   . GERD (gastroesophageal reflux disease)    Probable  . HTN (hypertension)   . Hyperlipidemia   . Ischemic cardiomyopathy    a. 04/2014 ECHO with EF 45-50% b.  Repeat 2D echo 08/14/14 with EF down at 15%. Life vest placed  . Non compliance w medication regimen   . Tobacco use     Past Surgical History:  Procedure Laterality Date  . CHOLECYSTECTOMY    . CORONARY ANGIOPLASTY WITH STENT PLACEMENT  05/03/14   STEMI- stent to LAD DES- Xience alpine  . CORONARY ANGIOPLASTY WITH STENT PLACEMENT  05/09/14   STEMI- overlapping stent to LAD, pt had missed a dose of Brilinta  . LEFT HEART CATHETERIZATION WITH CORONARY ANGIOGRAM N/A 05/03/2014   Procedure: LEFT HEART CATHETERIZATION WITH CORONARY ANGIOGRAM;  Surgeon: Shauntay Brunelli M [place], MD;  Location: [facility] CATH LAB;  Service: Cardiovascular;  Laterality: N/A;  . LEFT HEART CATHETERIZATION WITH CORONARY ANGIOGRAM N/A 05/09/2014   Procedure: LEFT HEART CATHETERIZATION WITH CORONARY ANGIOGRAM;  Surgeon: Corky Crafts, MD;  Location[facility] C[facility] LAB;  Service: Cardiovascular;  Laterality: N/A;  . PARTIAL HYSTERECTOMY    . PERCUTANEOUS STENT INTERVENTION  05/03/2014   Procedure: PERCUTANEOUS STENT INTERVENTION;  Surgeon: Lavonte Palos M [place], MD;  Location: [facility] CATH LAB;  Service: Cardiovascular;;  DES Prox LAD     Current Medications: Outpatient Medications Prior to Visit  Medication Sig Dispense Refill  . atorvastatin (LIPITOR) 80 MG tablet Take 1 tablet (80 mg total) by mouth daily. 90  tablet 3  . carboxymethylcellulose (REFRESH PLUS) 0.5 % SOLN Place 1 drop into both eyes 3 (three) times daily as needed (dry eyes).    . carvedilol (COREG) 3.125 MG tablet Take 1 tablet (3.125 mg total) by mouth 2 (two) times daily with a meal. 180 tablet 3  . cetirizine (ZYRTEC) 10 MG tablet Take 10 mg daily by mouth.     . cholecalciferol (VITAMIN D) 1000 units tablet Take 1,000 Units by mouth every 14 (fourteen) days. Twice a month    . ferrous sulfate 325 (65 FE) MG tablet Take 1 tablet (325 mg total) by mouth 3 (three) times a week. 13 tablet 0  . fluticasone (FLONASE) 50 MCG/ACT nasal spray Place 1 spray into both nostrils daily as needed for allergies (congestion).     . isosorbide mononitrate (IMDUR) 30 MG 24 hr tablet Take 2 tablets (60 mg total) by mouth daily. 90 tablet 3  . magnesium oxide (MAG-OX) 400 (241.3 Mg) MG tablet Take 400 mg by mouth every 14 (fourteen) days. Twice a month    . nitroGLYCERIN (NITROSTAT) 0.4 MG SL tablet Place  0.4 mg under the tongue every 5 (five) minutes as needed for chest pain.    . potassium chloride SA (KLOR-CON) 20 MEQ tablet Take 1 tablet (20 mEq total) by mouth daily. 90 tablet 3  . spironolactone (ALDACTONE) 25 MG tablet Take 1 tablet (25 mg total) by mouth daily. 90 tablet 3  . ticagrelor (BRILINTA) 60 MG TABS tablet Take 1 tablet (60 mg total) by mouth 2 (two) times daily. 180 tablet 3  . torsemide (DEMADEX) 20 MG tablet Take 2 tablets (40 mg total) by mouth daily. 180 tablet 3   No facility-administered medications prior to visit.     Allergies:   Aspirin, Effient [prasugrel], Entresto [sacubitril-valsartan], Lactose intolerance (gi), Robitussin dm [guaifenesin-dm], Sulfa antibiotics, and Other   Social History   Socioeconomic History  . Marital status: Widowed    Spouse name: Not on file  . Number of children: Not on file  . Years of education: Not on file  . Highest education level: Not on file  Occupational History  . Occupation: not  employed  Tobacco Use  . Smoking status: Current Some Day Smoker    Packs/day: 0.10    Years: 0.50    Pack years: 0.05    Types: Cigarettes  . Smokeless tobacco: Current User  . Tobacco comment: down to 3 cigarettes daily (08/03/14)  Substance and Sexual Activity  . Alcohol use: No  . Drug use: No  . Sexual activity: Not on file  Other Topics Concern  . Not on file  Social History Narrative   Patient has 6 brothers and sisters and none have known coronary artery disease. She lives alone in [place], but has several brothers in the area.   Social Determinants of Health   Financial Resource Strain:   . Difficulty of Paying Living Expenses:   Food Insecurity:   . Worried About Programme researcher, broadcasting/film/video in the Last Year:   . Barista in the Last Year:   Transportation Needs:   . Freight forwarder (Medical):   Marland Kitchen Lack of Transportation (Non-Medical):   Physical Activity:   . Days of Exercise per Week:   . Minutes of Exercise per Session:   Stress:   . Feeling of Stress :   Social Connections:   . Frequency of Communication with Friends and Family:   . Frequency of Social Gatherings with Friends and Family:   . Attends Religious Services:   . Active Member of Clubs or Organizations:   . Attends Banker Meetings:   Marland Kitchen Marital Status:      Family History:  The patient's family history includes Diabetes in her mother; Hypertension in her mother.   ROS:   Please see the history of present illness.    ROS All other systems reviewed and are negative.   PHYSICAL EXAM:   VS:  There were no vitals taken for this visit.   GENERAL:  WD BF in NAD HEENT:  PERRL, EOMI, sclera are clear. Oropharynx is clear. NECK:  JVD is not elevated, carotid upstroke brisk and symmetric, no bruits, no thyromegaly or adenopathy LUNGS:  Clear to auscultation bilaterally CHEST:  Unremarkable HEART:  RRR,  PMI not displaced or sustained,S1 and S2 within normal limits, no S3, no S4:  no clicks, no rubs, no murmurs ABD:  Soft, nontender. BS +,,  abd soft without distension. EXT:  2 + pulses throughout, no edema, no cyanosis no clubbing SKIN:  Warm and dry.  No rashes  NEURO:  Alert and oriented x 3. Cranial nerves II through XII intact. PSYCH:  Cognitively intact    Wt Readings from Last 3 Encounters:  12/05/19 172 lb 12.8 oz (78.4 kg)  11/03/19 176 lb 6.4 oz (80 kg)  10/21/19 195 lb 11.2 oz (88.8 kg)      Studies/Labs Reviewed:   EKG:  EKG is not ordered today.    Recent Labs: 04/21/2019: TSH 1.992 04/26/2019: Magnesium 2.0 10/11/2019: B Natriuretic Peptide 2,307.0 10/20/2019: ALT 24 10/21/2019: Hemoglobin 13.5; Platelets 196 11/14/2019: BUN 47; Creatinine, Ser 1.64; Potassium 5.0; Sodium 143   Lipid Panel    Component Value Date/Time   CHOL 122 10/19/2019 0519   TRIG 72 10/19/2019 0519   HDL 38 (L) 10/19/2019 0519   CHOLHDL 3.2 10/19/2019 0519   VLDL 14 10/19/2019 0519   LDLCALC 70 10/19/2019 0519    Additional studies/ records that were reviewed today include:   Cath 05/03/2014 PROCEDURAL FINDINGS Hemodynamics: AO 162/105 mean 130 mm Hg LV 158/26 mm Hg  Coronary angiography: Coronary dominance: right  Left mainstem: Normal  Left anterior descending (LAD): 95% proximal LAD stenosis with TIMI 2 flow.  Ramus intermediate: moderate sized vessel. Mild disease less than 20%.  Left circumflex (LCx): The LCx has 30% proximal disease. The LCx trifurcates into 3 OM branches. After the first branch there is a 60-70% stenosis in the OM prior to the next bifurcation.   Right coronary artery (RCA): The RCA arises low in the coronary cusp and was engaged with a Williams right catheter. This demonstrates a long segment of disease with 70-80% disease proximally tapering to 80-90% in the mid vessel.   Left ventriculography: Left ventricular systolic function is abnormal, LVEF is estimated at 35%, there is severe anterior hypokinesis with akinesis of  the anterior apex and inferior apex. There is mild mitral regurgitation   PCI Note: Following the diagnostic procedure, the decision was made to proceed with PCI. Weight-based bivalirudin was given for anticoagulation. Brilinta 180 mg was given orally. Once a therapeutic ACT was achieved, a 6 [place] XBLAD 3.5 guide catheter was inserted. A prowater coronary guidewire was used to cross the lesion. The lesion was predilated with a 2.0 mm balloon. The lesion was then stented with a 3.0 x 23 mm Xience Alpine stent. The stent was postdilated with a 3.25 mm noncompliant balloon. Following PCI, there was 0% residual stenosis and TIMI-3 flow. Final angiography confirmed an excellent result. The patient tolerated the procedure well. There were no immediate procedural complications. A TR band was used for radial hemostasis. The patient was transferred to the post catheterization recovery area for further monitoring.  PCI Data: Vessel - LAD/Segment - proximal Percent Stenosis (pre) 95% TIMI-flow 2 Stent 3.0 x 23 mm Xience Alpine Percent Stenosis (post) 0% TIMI-flow (post) 3  Final Conclusions:  1. Severe 3 vessel obstructive CAD. Culprit lesion in the proximal LAD. 2. Severe LV dysfunction. 3. Successful stenting of the proximal LAD with a DES.   Recommendations:  Continue DAPT for one year. IV diuresis. BP control. May consider patient for enrollment in Complete trial for her residual disease.    Cath 05/09/2014 IMPRESSIONS:  1. Normal left main coronary artery. 2. Subacute stent thrombosis of the day stent in the mid left anterior descending artery. Successful thrombectomy was performed with restoration of TIMI-3 flow. Intravascular ultrasound revealed a vessel diameter of about 3.5 mm. An overlapping stent was placed. This is a 3.0 x 23, overlapping the distal edge of the  prior stent. The entire stented segment was post dilated with a 3.5 x 20 noncompliant balloon. Intravascular  ultrasound was performed again showing excellent stent apposition and large lumen diameter.. 3. Moderate disease in the left circumflex artery and its branches. 4. Severe disease in the right coronary artery. 5. Left ventricular systolic function not assessed. LVEDP 23 mmHg. Marland Kitchen  RECOMMENDATION:Continue dual antiplatelet therapy. IV tirofiban will be continued as well. Continue medical therapy for RCA disease. We stressed the importance of taking her medications as prescribed. Continue aggressive secondary prevention. She would benefit from cardiac rehabilitation. We'll watch in the ICU.    Echo 11/21/2017 LV EF: 10% - 15%  ------------------------------------------------------------------- Indications: CHF - 428.0.  ------------------------------------------------------------------- History: PMH: Coronary artery disease. Congestive heart failure. Cardiomyopathy of unknown etiology. PMH: Myocardial infarction. Risk factors: Current tobacco use. Hypertension. Dyslipidemia.  ------------------------------------------------------------------- Study Conclusions  - Left ventricle: The cavity size was mildly dilated. Wall thickness was normal. The estimated ejection fraction was in the range of 10% to 15%. Doppler parameters are consistent with restrictive physiology, indicative of decreased left ventricular diastolic compliance and/or increased left atrial pressure. - Mitral valve: There was mild regurgitation. - Left atrium: The atrium was mildly dilated. - Right ventricle: The cavity size was mildly dilated. Systolic function was moderately to severely reduced. - Tricuspid valve: There was moderate regurgitation. - Pulmonary arteries: PA peak pressure: 31 mm Hg (S).  Impressions:  - Technically very limited study due to poor sound wave transmission. There severe global LV dysfunction with moderate to sevre RV dysfunction.  Echo  11/28/18: Study Conclusions  - Left ventricle: The cavity size was normal. Wall thickness was   increased in a pattern of mild LVH. The estimated ejection   fraction was 15%. Diffuse hypokinesis. No mural thrombus by   Definity contrast. Doppler parameters are consistent with   restrictive left ventricular relaxation (grade 3 diastolic   dysfunction). The E/e&' ratio is >20, suggesting markedly elevated   LV filling pressure. - Mitral valve: Mildly thickened leaflets . There was moderate   regurgitation. - Left atrium: The atrium was normal in size. - Right ventricle: The cavity size was moderately dilated. Mild   systolic dysfunction. - Right atrium: Severely dilated. - Tricuspid valve: There was moderate regurgitation. - Pulmonary arteries: PA peak pressure: 53 mm Hg (S). - Inferior vena cava: The vessel was dilated. The respirophasic   diameter changes were blunted (< 50%), consistent with elevated   central venous pressure.    ASSESSMENT:    No diagnosis found.   PLAN:  In order of problems listed above:  1.  Chronic systolic/diastolic heart failure: very good response to diuretics in the hospital and continued weight loss since DC.  Ascites has resolved.  She seems to be compliant with  Torsemide. Stressed importance of compliance with medication and sodium restriction. Weigh daily and let us know if it is increasing.  Otherwise continue Coreg, spironolactone, imdur and hydralazine. Will follow up in 2 months with CMET and BNP.   2. Ischemic cardiomyopathy: EF 15-20%. Chronic.  She could not tolerate Entresto due to side effect of dizziness.    3. Hypertension: Blood pressure stable  4. Hyperlipidemia: On Lipitor 40 mg daily  5. CAD s/p remote anterior MI with early stent thrombosis due to noncompliance. On chronic Brilinta.   6.   Elevated LFTs/bili with ascites. Suspect mostly congestive hepatopathy. Will check LFTs next visit.     Medication Adjustments/Labs and  Tests Ordered: Current medicines are reviewed  at length with the patient today.  Concerns regarding medicines are outlined above.  Medication changes, Labs and Tests ordered today are listed in the Patient Instructions below. There are no Patient Instructions on file for this visit.   Signed, Junie Avilla [place], MD  03/05/2020 5:32 PM    [facility] [address] [place], [place], [place]  [address] Phone: [phone]; Fax: [phone]

## 2020-03-13 ENCOUNTER — Emergency Department (HOSPITAL_COMMUNITY)
Admission: EM | Admit: 2020-03-13 | Discharge: 2020-03-13 | Disposition: A | Discharge status: Home / Self Care | Payer: Medicare PPO | Attending: Emergency Medicine | Admitting: Emergency Medicine

## 2020-03-13 ENCOUNTER — Encounter (HOSPITAL_COMMUNITY): Payer: Self-pay | Admitting: *Deleted

## 2020-03-13 ENCOUNTER — Ambulatory Visit: Payer: Medicare PPO | Admitting: Cardiology

## 2020-03-13 ENCOUNTER — Other Ambulatory Visit: Payer: Self-pay

## 2020-03-13 DIAGNOSIS — Y999 Unspecified external cause status: Secondary | ICD-10-CM | POA: Insufficient documentation

## 2020-03-13 DIAGNOSIS — X58XXXA Exposure to other specified factors, initial encounter: Secondary | ICD-10-CM | POA: Diagnosis not present

## 2020-03-13 DIAGNOSIS — Y929 Unspecified place or not applicable: Secondary | ICD-10-CM | POA: Diagnosis not present

## 2020-03-13 DIAGNOSIS — Y939 Activity, unspecified: Secondary | ICD-10-CM | POA: Insufficient documentation

## 2020-03-13 DIAGNOSIS — Z5321 Procedure and treatment not carried out due to patient leaving prior to being seen by health care provider: Secondary | ICD-10-CM | POA: Diagnosis not present

## 2020-03-13 DIAGNOSIS — S90821A Blister (nonthermal), right foot, initial encounter: Secondary | ICD-10-CM | POA: Diagnosis present

## 2020-03-13 NOTE — ED Triage Notes (Signed)
To ED for eval of blisters and calluses to right foot for past month. Pedal pulses palpable and strong.

## 2020-03-13 NOTE — ED Notes (Signed)
Pt stated "I will come back tomorrow"

## 2020-03-16 ENCOUNTER — Encounter (HOSPITAL_COMMUNITY): Payer: Self-pay | Admitting: Emergency Medicine

## 2020-03-16 ENCOUNTER — Other Ambulatory Visit: Payer: Self-pay

## 2020-03-16 ENCOUNTER — Emergency Department (HOSPITAL_COMMUNITY)
Admission: EM | Admit: 2020-03-16 | Discharge: 2020-03-16 | Disposition: A | Discharge status: Home / Self Care | Payer: Medicare PPO | Attending: Emergency Medicine | Admitting: Emergency Medicine

## 2020-03-16 DIAGNOSIS — I5042 Chronic combined systolic (congestive) and diastolic (congestive) heart failure: Secondary | ICD-10-CM | POA: Insufficient documentation

## 2020-03-16 DIAGNOSIS — L84 Corns and callosities: Secondary | ICD-10-CM | POA: Diagnosis not present

## 2020-03-16 DIAGNOSIS — M79671 Pain in right foot: Secondary | ICD-10-CM | POA: Diagnosis present

## 2020-03-16 DIAGNOSIS — F17210 Nicotine dependence, cigarettes, uncomplicated: Secondary | ICD-10-CM | POA: Insufficient documentation

## 2020-03-16 DIAGNOSIS — I130 Hypertensive heart and chronic kidney disease with heart failure and stage 1 through stage 4 chronic kidney disease, or unspecified chronic kidney disease: Secondary | ICD-10-CM | POA: Diagnosis not present

## 2020-03-16 DIAGNOSIS — Z955 Presence of coronary angioplasty implant and graft: Secondary | ICD-10-CM | POA: Insufficient documentation

## 2020-03-16 DIAGNOSIS — I2510 Atherosclerotic heart disease of native coronary artery without angina pectoris: Secondary | ICD-10-CM | POA: Diagnosis not present

## 2020-03-16 DIAGNOSIS — Z79899 Other long term (current) drug therapy: Secondary | ICD-10-CM | POA: Insufficient documentation

## 2020-03-16 DIAGNOSIS — N183 Chronic kidney disease, stage 3 unspecified: Secondary | ICD-10-CM | POA: Insufficient documentation

## 2020-03-16 NOTE — ED Provider Notes (Signed)
[facility] EMERGENCY DEPARTMENT Provider Note   CSN: [id number] Arrival date & time: 03/16/20  0941     History Chief Complaint  Patient presents with  . Foot Pain    Deborah Ho is a 69 y.o. female.  The history is provided by the patient. No language interpreter was used.  Foot Pain     69 year old female with hx of CHF, CKD, CAD, HTN here with R toe pain.  Patient report approximately a month ago she noticed swelling to the dorsum of her right second toe as well as the lateral aspect of her right foot.  He has been increasing in size and become more more tender.  Pain worse when she put her shoes or when she walks improved with rest.  She tries warm water soak, alcohol, and other over-the-counter treatment without relief.  She denies any numbness, denies any injury, no discharge redness that she has noticed.  She does have an appointment with her foot specialist in a week.  She denies any recent change in her footwear.    Past Medical History:  Diagnosis Date  . CAD (coronary artery disease) 05/03/14; 05/09/14   a. anterior STEMI with early in-stent thorombosis for missed dose of Brillinta s/p PCI with DESx 2 into LAD (04/2014)  . CHF (congestive heart failure) (HCC)   . GERD (gastroesophageal reflux disease)    Probable  . HTN (hypertension)   . Hyperlipidemia   . Ischemic cardiomyopathy    a. 04/2014 ECHO with EF 45-50% b.  Repeat 2D echo 08/14/14 with EF down at 15%. Life vest placed  . Non compliance w medication regimen   . Tobacco use     Patient Active Problem List   Diagnosis Date Noted  . Palliative care by specialist   . Goals of care, counseling/discussion   . Acute on chronic diastolic CHF (congestive heart failure) (HCC) 10/12/2019  . Acute systolic CHF (congestive heart failure) (HCC) 10/11/2019  . Vitamin D deficiency 09/07/2019  . Obesity with body mass index 30 or greater 09/07/2019  . Abdominal pain 12/16/2018  . Acute CHF  (congestive heart failure) (HCC) 12/15/2018  . Acute exacerbation of CHF (congestive heart failure) (HCC) 11/28/2018  . Acute on chronic systolic CHF (congestive heart failure) (HCC) 11/27/2018  . Acute on chronic combined systolic and diastolic CHF (congestive heart failure) (HCC) 04/02/2017  . Noncompliance with medication regimen 04/02/2017  . Intractable nausea and vomiting 02/18/2017  . Hyperlipidemia   . GERD (gastroesophageal reflux disease)   . CHF (congestive heart failure) (HCC)   . Cellulitis of left lower extremity   . Cough productive of clear sputum   . Cellulitis of left lower leg   . Hypokalemia 01/28/2017  . Peripheral edema   . Abdominal distension 06/26/2016  . Anasarca 06/26/2016  . Chronic systolic heart failure (HCC) 02/20/2016  . CKD (chronic kidney disease), stage III   . Adjustment disorder with mixed anxiety and depressed mood 02/28/2015  . Pleural effusion on right   . Essential hypertension   . Angioedema of lips 08/17/2014  . Ischemic cardiomyopathy 08/13/2014  . Coronary atherosclerosis of native coronary artery 05/10/2014  . History of ST elevation myocardial infarction (STEMI) 05/09/2014  . HLD (hyperlipidemia) 05/06/2014  . Tobacco abuse 05/06/2014  . Cardiomyopathy, ischemic 05/06/2014  . Elevated LFTs 05/06/2014    Past Surgical History:  Procedure Laterality Date  . CHOLECYSTECTOMY    . CORONARY ANGIOPLASTY WITH STENT PLACEMENT  05/03/14  STEMI- stent to LAD DES- Xience alpine  . CORONARY ANGIOPLASTY WITH STENT PLACEMENT  05/09/14   STEMI- overlapping stent to LAD, pt had missed a dose of Brilinta  . LEFT HEART CATHETERIZATION WITH CORONARY ANGIOGRAM N/A 05/03/2014   Procedure: LEFT HEART CATHETERIZATION WITH CORONARY ANGIOGRAM;  Surgeon: Peter M Martinique, MD;  Location: [facility] CATH LAB;  Service: Cardiovascular;  Laterality: N/A;  . LEFT HEART CATHETERIZATION WITH CORONARY ANGIOGRAM N/A 05/09/2014   Procedure: LEFT HEART CATHETERIZATION WITH  CORONARY ANGIOGRAM;  Surgeon: [name], MD;  Location: [facility] CATH LAB;  Service: Cardiovascular;  Laterality: N/A;  . PARTIAL HYSTERECTOMY    . PERCUTANEOUS STENT INTERVENTION  05/03/2014   Procedure: PERCUTANEOUS STENT INTERVENTION;  Surgeon: Peter M Martinique, MD;  Location: [facility] CATH LAB;  Service: Cardiovascular;;  DES Prox LAD      OB History   No obstetric history on file.     Family History  Problem Relation Age of Onset  . Diabetes Mother   . Hypertension Mother     Social History   Tobacco Use  . Smoking status: Current Some Day Smoker    Packs/day: 0.10    Years: 0.50    Pack years: 0.05    Types: Cigarettes  . Smokeless tobacco: Current User  . Tobacco comment: down to 3 cigarettes daily (08/03/14)  Substance Use Topics  . Alcohol use: No  . Drug use: No    Home Medications Prior to Admission medications   Medication Sig Start Date End Date Taking? Authorizing Provider  atorvastatin (LIPITOR) 80 MG tablet Take 1 tablet (80 mg total) by mouth daily. 12/05/19   Martinique, Peter M, MD  carboxymethylcellulose (REFRESH PLUS) 0.5 % SOLN Place 1 drop into both eyes 3 (three) times daily as needed (dry eyes).    [provider]  carvedilol (COREG) 3.125 MG tablet Take 1 tablet (3.125 mg total) by mouth 2 (two) times daily with a meal. 12/05/19   Martinique, Peter M, MD  cetirizine (ZYRTEC) 10 MG tablet Take 10 mg daily by mouth.     [provider]  cholecalciferol (VITAMIN D) 1000 units tablet Take 1,000 Units by mouth every 14 (fourteen) days. Twice a month    [provider]  ferrous sulfate 325 (65 FE) MG tablet Take 1 tablet (325 mg total) by mouth 3 (three) times a week. 10/23/19 11/23/19  Swayze, Ava, DO  fluticasone (FLONASE) 50 MCG/ACT nasal spray Place 1 spray into both nostrils daily as needed for allergies (congestion).  04/04/18   [provider]  isosorbide mononitrate (IMDUR) 30 MG 24 hr tablet Take 2 tablets (60 mg total) by mouth  daily. 12/05/19   Martinique, Peter M, MD  magnesium oxide (MAG-OX) 400 (241.3 Mg) MG tablet Take 400 mg by mouth every 14 (fourteen) days. Twice a month    [provider]  nitroGLYCERIN (NITROSTAT) 0.4 MG SL tablet Place 0.4 mg under the tongue every 5 (five) minutes as needed for chest pain.    [provider]  potassium chloride SA (KLOR-CON) 20 MEQ tablet Take 1 tablet (20 mEq total) by mouth daily. 12/05/19   Martinique, Peter M, MD  spironolactone (ALDACTONE) 25 MG tablet Take 1 tablet (25 mg total) by mouth daily. 12/05/19   Martinique, Peter M, MD  ticagrelor (BRILINTA) 60 MG TABS tablet Take 1 tablet (60 mg total) by mouth 2 (two) times daily. 12/05/19   Martinique, Peter M, MD  torsemide (DEMADEX) 20 MG tablet Take 2 tablets (  40 mg total) by mouth daily. 12/05/19   [place], Peter M, MD    Allergies    Aspirin, Effient [prasugrel], Entresto [sacubitril-valsartan], Lactose intolerance (gi), Robitussin dm [guaifenesin-dm], Sulfa antibiotics, and Other  Review of Systems   Review of Systems  Constitutional: Negative for fever.  Skin: Negative for wound.  Neurological: Negative for numbness.    Physical Exam Updated Vital Signs BP (!) 120/58   Pulse 88   Temp 97.6 F (36.4 C)   Resp 18   Wt 86.2 kg   SpO2 99%   BMI 31.16 kg/m   Physical Exam Vitals and nursing note reviewed.  Constitutional:      General: She is not in acute distress.    Appearance: She is well-developed.  HENT:     Head: Atraumatic.  Eyes:     Conjunctiva/sclera: Conjunctivae normal.  Musculoskeletal:     Cervical back: Neck supple.  Skin:    Findings: No rash.     Comments: Right foot: An area of excessive skin growth noted to the dorsum of second toe measuring approximately 5 mm in diameter mildly tender to palpation without surrounding skin erythema or warmth noted.  Finding consistent with a callus.  Similar excessive skin growth noted to the lateral aspect of the right foot measuring approximately  2 cm in diameter are tender to palpation.  No deformity noted.  Neurological:     Mental Status: She is alert.     ED Results / Procedures / Treatments   Labs (all labs ordered are listed, but only abnormal results are displayed) Labs Reviewed - No data to display  EKG None  Radiology No results found.  Procedures Procedures (including critical care time)  Medications Ordered in ED Medications - No data to display  ED Course  I have reviewed the triage vital signs and the nursing notes.  Pertinent labs & imaging results that were available during my care of the patient were reviewed by me and considered in my medical decision making (see chart for details).    MDM Rules/Calculators/A&P                      BP (!) 120/58   Pulse 88   Temp 97.6 F (36.4 C)   Resp 18   Wt 86.2 kg   SpO2 99%   BMI 31.16 kg/m   Final Clinical Impression(s) / ED Diagnoses Final diagnoses:  Callus of foot    Rx / DC Orders ED Discharge Orders    None     12:11 PM Patient here with right foot pain.  Findings consistent with calluses to the dorsum of her second toe as it sits overriding the great toe.  There is also callus is forming to the lateral aspect of her foot as well.  No signs of infection.  Encourage patient to follow-up with podiatrist in which she is scheduled to be seen in 1 week.  Recommend Dr. Margart Sickles wart pad to use as needed.   Fayrene Helper, PA-C 03/16/20 1214    Tegeler, Canary Brim, MD 03/16/20 2012

## 2020-03-16 NOTE — Discharge Instructions (Signed)
Please follow-up with your foot specialist as scheduled in 1 week for further evaluation and managements of your right foot pain likely due to callus.

## 2020-03-16 NOTE — ED Notes (Signed)
Patient verbalizes understanding of discharge instructions. Opportunity for questioning and answers were provided. Armband removed by staff, pt discharged from ED ambulatory.   

## 2020-03-16 NOTE — ED Triage Notes (Signed)
C/o blisters and calluses to R foot x 1 month.  States it hurts across toes and the bottom of foot.  No known injury.

## 2020-03-25 ENCOUNTER — Other Ambulatory Visit: Payer: Self-pay

## 2020-03-25 ENCOUNTER — Ambulatory Visit: Payer: Medicare PPO | Admitting: Podiatry

## 2020-03-25 ENCOUNTER — Encounter: Payer: Self-pay | Admitting: Podiatry

## 2020-03-25 ENCOUNTER — Other Ambulatory Visit: Payer: Self-pay | Admitting: Podiatry

## 2020-03-25 ENCOUNTER — Ambulatory Visit (INDEPENDENT_AMBULATORY_CARE_PROVIDER_SITE_OTHER): Payer: Medicare PPO

## 2020-03-25 VITALS — Temp 97.6°F

## 2020-03-25 DIAGNOSIS — M7751 Other enthesopathy of right foot: Secondary | ICD-10-CM

## 2020-03-25 DIAGNOSIS — R52 Pain, unspecified: Secondary | ICD-10-CM

## 2020-03-25 DIAGNOSIS — M779 Enthesopathy, unspecified: Secondary | ICD-10-CM | POA: Diagnosis not present

## 2020-03-25 DIAGNOSIS — M2041 Other hammer toe(s) (acquired), right foot: Secondary | ICD-10-CM

## 2020-03-25 DIAGNOSIS — Q828 Other specified congenital malformations of skin: Secondary | ICD-10-CM | POA: Diagnosis not present

## 2020-03-28 NOTE — Progress Notes (Signed)
Subjective:   Patient ID: Deborah Ho, female   DOB: 69 y.o.   MRN: [id number]   HPI Patient presents stating she has several lesions on both feet with the right being worse and it made it hard for her to wear shoe gear.  Also is concerned about digital deformities and has inflammation around the fifth digit right that is very painful with fluid buildup and states that she does not know whether surgery would be a good alternative but she is in pain.  Patient does not smoke likes to be active   Review of Systems  All other systems reviewed and are negative.       Objective:  Physical Exam Vitals and nursing note reviewed.  Constitutional:      Appearance: She is well-developed.  Pulmonary:     Effort: Pulmonary effort is normal.  Musculoskeletal:        General: Normal range of motion.  Skin:    General: Skin is warm.  Neurological:     Mental Status: She is alert.     Neurovascular status intact muscle strength found to be adequate range of motion within normal limits.  Patient is noted to have structural malalignment with digital deformities rotational component and is also noted on the fifth digit right to have fluid buildup around the inner phalangeal joint with rotation of the toe.  She has keratotic second fifth digit right over left with subfifth lesions that are painful when pressed and make shoe gear difficult and was noted to have good digital perfusion well oriented x3    Assessment:  Hammertoe deformity with capsulitis fifth digit right with pain and keratotic lesions painful when palpated     Plan:  H&P reviewed all conditions and discussed options.  I do not recommend surgical intervention currently but I did go ahead debrided lesions apply padding and for the fifth digit I did do sterile prep and injected the inner phalangeal joint 1 mg Dexasone 1 Kenalog and I then discussed digital options that we could consider surgically along with at this point shoe gear  modifications and padding.  Patient will be seen back to recheck as symptoms indicate and may require more extensive work  X-rays indicate structural bunion with elevated digits 2 with rotational component and enlargement of the head of the proximal phalanx digit 5 bilateral

## 2020-07-31 ENCOUNTER — Other Ambulatory Visit: Payer: Self-pay | Admitting: Cardiology

## 2020-08-22 NOTE — Progress Notes (Signed)
Cardiology Office Note   Date:  08/29/2020   ID:  Deborah Ho, DOB July 01, 1951, MRN [id number]  PCP:  Grayce Sessions, NP  Cardiologist:  Peter Swaziland, MD EP: None  Chief Complaint  Patient presents with  . Pre-op Exam  . Follow-up    CHF      History of Present Illness: Deborah Ho is a 69 y.o. female with a PMH of CAD s/p STEMI with PCI/DES to LAD x2 in 2015, chronic combined CHF/ischemic cardiomyopathy, HTN, HLD, tobacco abuse, and non-compliance who presents for preoperative evaluation.  She has a longstanding history of non-compliance and as a result se was fired from the [facility] clinic. She was last evaluated by cardiology at an outpatient visit with Dr. Swaziland 11/2019 at which time she was doing well from a cardiac standpoint after maintaining compliance with her diuretic therapy. No medication changes occurred at this visit and she was recommended to follow-up in 2 months. She missed a couple visits since that time. Her last echocardiogram 11/2018 showed EF <15% with diffuse hypokinesis, G3DD, mild RV systolic dysfunction, severely dilated RA, moderate MR, and moderate TR. Her last ischemic evaluation appears to be a LHC in 2015 when she experienced thrombosis of her previously placed LAD stent which was managed with overlapping PCI/DES with residual moderate LCx and severe RCA stenosis which was medically managed.   She presents today for preoperative evaluation. She is surprised to see her weight is up 20+ lbs from her last visit in May. She reported missing a couple days of medications back in August, but otherwise says she has been compliant with her medications. She reports eating a low salt diet and has cut out TV dinners. She attributes her weight gain to drinking mineral water, rather than filtered water recently. She reports recent abdominal bloating but no complaints of SOB, DOE, orthopnea, or LE edema. She notes some cramping in her legs at night.  She denies any chest pain since her last visit. No complaints of palpitations, dizziness, lightheadedness, or syncope. She is anticipating cataract surgery in the coming weeks.     Past Medical History:  Diagnosis Date  . CAD (coronary artery disease) 05/03/14; 05/09/14   a. anterior STEMI with early in-stent thorombosis for missed dose of Brillinta s/p PCI with DESx 2 into LAD (04/2014)  . CHF (congestive heart failure) (HCC)   . GERD (gastroesophageal reflux disease)    Probable  . HTN (hypertension)   . Hyperlipidemia   . Ischemic cardiomyopathy    a. 04/2014 ECHO with EF 45-50% b.  Repeat 2D echo 08/14/14 with EF down at 15%. Life vest placed  . Non compliance w medication regimen   . Tobacco use     Past Surgical History:  Procedure Laterality Date  . CHOLECYSTECTOMY    . CORONARY ANGIOPLASTY WITH STENT PLACEMENT  05/03/14   STEMI- stent to LAD DES- Xience alpine  . CORONARY ANGIOPLASTY WITH STENT PLACEMENT  05/09/14   STEMI- overlapping stent to LAD, pt had missed a dose of Brilinta  . LEFT HEART CATHETERIZATION WITH CORONARY ANGIOGRAM N/A 05/03/2014   Procedure: LEFT HEART CATHETERIZATION WITH CORONARY ANGIOGRAM;  Surgeon: Peter M Swaziland, MD;  Location: [facility] CATH LAB;  Service: Cardiovascular;  Laterality: N/A;  . LEFT HEART CATHETERIZATION WITH CORONARY ANGIOGRAM N/A 05/09/2014   Procedure: LEFT HEART CATHETERIZATION WITH CORONARY ANGIOGRAM;  Surgeon: Corky Crafts, MD;  Location: [facility] CATH LAB;  Service: Cardiovascular;  Laterality: N/A;  . PARTIAL HYSTERECTOMY    .  PERCUTANEOUS STENT INTERVENTION  05/03/2014   Procedure: PERCUTANEOUS STENT INTERVENTION;  Surgeon: Peter M Swaziland, MD;  Location: [facility] CATH LAB;  Service: Cardiovascular;;  DES Prox LAD      Current Outpatient Medications  Medication Sig Dispense Refill  . atorvastatin (LIPITOR) 80 MG tablet Take 1 tablet (80 mg total) by mouth daily. 90 tablet 3  . carboxymethylcellulose (REFRESH PLUS) 0.5 % SOLN Place 1 drop into  both eyes 3 (three) times daily as needed (dry eyes).    . carvedilol (COREG) 3.125 MG tablet Take 1 tablet (3.125 mg total) by mouth 2 (two) times daily with a meal. 180 tablet 3  . cetirizine (ZYRTEC) 10 MG tablet Take 10 mg daily by mouth.     . cholecalciferol (VITAMIN D) 1000 units tablet Take 1,000 Units by mouth every 14 (fourteen) days. Twice a month    . ferrous sulfate 325 (65 FE) MG tablet Take 1 tablet (325 mg total) by mouth 3 (three) times a week. 13 tablet 0  . fluticasone (FLONASE) 50 MCG/ACT nasal spray Place 1 spray into both nostrils daily as needed for allergies (congestion).     . isosorbide mononitrate (IMDUR) 30 MG 24 hr tablet TAKE 2 TABLETS BY MOUTH DAILY 90 tablet 3  . magnesium oxide (MAG-OX) 400 (241.3 Mg) MG tablet Take 400 mg by mouth every 14 (fourteen) days. Twice a month    . nitroGLYCERIN (NITROSTAT) 0.4 MG SL tablet Place 0.4 mg under the tongue every 5 (five) minutes as needed for chest pain.    . potassium chloride SA (KLOR-CON) 20 MEQ tablet Take 1 tablet (20 mEq total) by mouth daily. Take 20mg  twice daily x3 days then back to 20mg  daily 36 tablet 3  . spironolactone (ALDACTONE) 25 MG tablet Take 1 tablet (25 mg total) by mouth daily. 90 tablet 3  . ticagrelor (BRILINTA) 60 MG TABS tablet Take 1 tablet (60 mg total) by mouth 2 (two) times daily. 180 tablet 3  . torsemide (DEMADEX) 20 MG tablet Take 2 tablets (40 mg total) by mouth daily. Take 40mg  twice daily x3 days then back to 40mg  daily 36 tablet 3   No current facility-administered medications for this visit.    Allergies:   Aspirin, Effient [prasugrel], Entresto [sacubitril-valsartan], Lactose intolerance (gi), Robitussin dm [guaifenesin-dm], Sulfa antibiotics, and Other    Social History:  The patient  reports that she has quit smoking. Her smoking use included cigarettes. She has a 0.05 pack-year smoking history. She has quit using smokeless tobacco. She reports that she does not drink alcohol and  does not use drugs.   Family History:  The patient's family history includes Diabetes in her mother; Hypertension in her mother.    ROS:  Please see the history of present illness.   Otherwise, review of systems are positive for none.   All other systems are reviewed and negative.    PHYSICAL EXAM: VS:  BP 138/82   Pulse 73   Ht 5\' 5"  (1.651 m)   Wt 213 lb 12.8 oz (97 kg)   SpO2 97%   BMI 35.58 kg/m  , BMI Body mass index is 35.58 kg/m. GEN: Well nourished, well developed, in no acute distress HEENT: sclera anicteric Neck: no JVD, carotid bruits, or masses Cardiac: RRR; no murmurs, rubs, or gallops,no edema  Respiratory:  clear to auscultation bilaterally, normal work of breathing GI: soft, obese, nontender, mildly distended, + BS MS: no deformity or atrophy Skin: warm and dry, no rash Neuro:  Strength and sensation are intact Psych: euthymic mood, full affect   EKG:  EKG is ordered today. The ekg ordered today demonstrates sinus rhythm with rate 73 bpm, new Q waves in inferior leads, new TWI in lateral leads, no STE/D. Reviewed with Dr. Swaziland.    Recent Labs: 10/11/2019: B Natriuretic Peptide 2,307.0 10/20/2019: ALT 24 10/21/2019: Hemoglobin 13.5; Platelets 196 11/14/2019: BUN 47; Creatinine, Ser 1.64; Potassium 5.0; Sodium 143    Lipid Panel    Component Value Date/Time   CHOL 122 10/19/2019 0519   TRIG 72 10/19/2019 0519   HDL 38 (L) 10/19/2019 0519   CHOLHDL 3.2 10/19/2019 0519   VLDL 14 10/19/2019 0519   LDLCALC 70 10/19/2019 0519      Wt Readings from Last 3 Encounters:  08/29/20 213 lb 12.8 oz (97 kg)  03/16/20 190 lb 2 oz (86.2 kg)  03/13/20 182 lb (82.6 kg)      Other studies Reviewed: Additional studies/ records that were reviewed today include:   Echocardiogram 11/2018: - Left ventricle: The cavity size was normal. Wall thickness was  increased in a pattern of mild LVH. The estimated ejection  fraction was 15%. Diffuse hypokinesis. No  mural thrombus by  Definity contrast. Doppler parameters are consistent with  restrictive left ventricular relaxation (grade 3 diastolic  dysfunction). The E/e&' ratio is >20, suggesting markedly elevated  LV filling pressure.  - Mitral valve: Mildly thickened leaflets . There was moderate  regurgitation.  - Left atrium: The atrium was normal in size.  - Right ventricle: The cavity size was moderately dilated. Mild  systolic dysfunction.  - Right atrium: Severely dilated.  - Tricuspid valve: There was moderate regurgitation.  - Pulmonary arteries: PA peak pressure: 53 mm Hg (S).  - Inferior vena cava: The vessel was dilated. The respirophasic  diameter changes were blunted (< 50%), consistent with elevated  central venous pressure.   Impressions:   - Compared to a prior study in 11/2017, the LVEF is unchanged at  15%. Left and right heart pressures are quite elevated.   LHC 2015: Coronary angiography: Coronary dominance: right  Left mainstem: Normal  Left anterior descending (LAD): 95% proximal LAD stenosis with TIMI 2 flow.  Ramus intermediate: moderate sized vessel. Mild disease less than 20%.  Left circumflex (LCx): The LCx has 30% proximal disease. The LCx trifurcates into 3 OM branches. After the first branch there is a 60-70% stenosis in the OM prior to the next bifurcation.   Right coronary artery (RCA): The RCA arises low in the coronary cusp and was engaged with a Williams right catheter. This demonstrates a long segment of disease with 70-80% disease proximally tapering to 80-90% in the mid vessel.   Left ventriculography: Left ventricular systolic function is abnormal, LVEF is estimated at 35%, there is severe anterior hypokinesis with akinesis of the anterior apex and inferior apex. There is mild mitral regurgitation   Final Conclusions:   1. Severe 3 vessel obstructive CAD. Culprit lesion in the proximal LAD. 2. Severe LV dysfunction. 3.  Successful stenting of the proximal LAD with a DES.   Recommendations:  Continue DAPT for one year. IV diuresis. BP control. May consider patient for enrollment in Complete trial for her residual disease.   Subsequent LHC 2015:  1. Normal left main coronary artery. 2. Subacute stent thrombosis of the day stent in the mid left anterior descending artery.  Successful thrombectomy was performed with restoration of TIMI-3 flow. Intravascular ultrasound revealed a vessel diameter of  about 3.5 mm. An overlapping stent was placed. This is a 3.0 x 23, overlapping the distal edge of the prior stent. The entire stented segment was post dilated with a 3.5 x 20 noncompliant balloon. Intravascular ultrasound was performed again showing excellent stent apposition and large lumen diameter.. 3. Moderate disease in the left circumflex artery and its branches. 4. Severe disease in the right coronary artery. 5. Left ventricular systolic function not assessed.  LVEDP 23 mmHg.    ASSESSMENT AND PLAN:  1. CAD s/p PCI/DES to LAD x2 in 2015: EKG today showed new Q wave in inferior leads and new TWI in lateral leads. That being said, she has no anginal complaints. No utility in obtaining a NST which would be grossly abnormal given her cardiomyopathy and in the setting of no symptoms would not send her to the cath lab. Will continue medical management at this point.  - Continue brilinta and statin - Continue BBlocker and imdur  2. Acute on chronic combined CHF/ischemic cardiomyopathy: Weight is up 23lbs from 03/2020. She has increased abdominal bloating but otherwise is asymptomatic.  - Will increase torsemide to 40mg  BID x3 days, then resume once daily dosing. She will take additional potassium with increased torsemide dose.  - Will check a BMET and Mg today - Continue carvedilol, spironolactone, and torsemide  - Will plan for close follow-up next week to ensure weight is coming down  3. HTN: BP 138/82 today -  Managed in the context of #2  4. HLD: LDL 70 10/2019 - Continue atorvastatin  5. Tobacco abuse:  - Continue to encourage smoking cessation  6. CKD stage 3: Cr 1.6 10/2019 - Will check BMET today  7. Preoperative evaluation: anticipating upcoming cataract surgery. She describes no recent anginal complaints. - Cataract extractions are recognized in guidelines as low risk surgeries that do not typically require specific preoperative testing or holding of blood thinner therapy. Therefore, given past medical history and time since last visit, based on ACC/AHA guidelines, Neyah Ensminger would be at acceptable risk for the planned procedure without further cardiovascular testing.  - I will route this recommendation to the requesting party via Epic fax function and remove from pre-op pool.   Current medicines are reviewed at length with the patient today.  The patient does not have concerns regarding medicines.  The following changes have been made:  As above  Labs/ tests ordered today include:    Orders Placed This Encounter  Procedures  . Basic metabolic panel  . Magnesium  . EKG 12-Lead     Disposition:   FU with Dr. 11/2019 09/05/20 as previously scheduled. Can obtain a repeat BMET at that time for close monitoring of her kidney function.  Signed, 09/07/20, PA-C  08/29/2020 1:25 PM

## 2020-08-29 ENCOUNTER — Other Ambulatory Visit: Payer: Self-pay

## 2020-08-29 ENCOUNTER — Ambulatory Visit (INDEPENDENT_AMBULATORY_CARE_PROVIDER_SITE_OTHER): Payer: Medicare PPO | Admitting: Medical

## 2020-08-29 ENCOUNTER — Encounter: Payer: Self-pay | Admitting: Medical

## 2020-08-29 VITALS — BP 138/82 | HR 73 | Ht 65.0 in | Wt 213.8 lb

## 2020-08-29 DIAGNOSIS — I2510 Atherosclerotic heart disease of native coronary artery without angina pectoris: Secondary | ICD-10-CM | POA: Diagnosis not present

## 2020-08-29 DIAGNOSIS — I255 Ischemic cardiomyopathy: Secondary | ICD-10-CM

## 2020-08-29 DIAGNOSIS — E785 Hyperlipidemia, unspecified: Secondary | ICD-10-CM

## 2020-08-29 DIAGNOSIS — Z79899 Other long term (current) drug therapy: Secondary | ICD-10-CM

## 2020-08-29 DIAGNOSIS — N1832 Chronic kidney disease, stage 3b: Secondary | ICD-10-CM

## 2020-08-29 DIAGNOSIS — I5043 Acute on chronic combined systolic (congestive) and diastolic (congestive) heart failure: Secondary | ICD-10-CM | POA: Diagnosis not present

## 2020-08-29 DIAGNOSIS — I10 Essential (primary) hypertension: Secondary | ICD-10-CM

## 2020-08-29 DIAGNOSIS — Z720 Tobacco use: Secondary | ICD-10-CM

## 2020-08-29 DIAGNOSIS — Z01810 Encounter for preprocedural cardiovascular examination: Secondary | ICD-10-CM

## 2020-08-29 MED ORDER — SPIRONOLACTONE 25 MG PO TABS: 25.0000 mg | ORAL_TABLET | Freq: Every day | ORAL | 1 refills | Status: DC

## 2020-08-29 MED ORDER — ATORVASTATIN CALCIUM 80 MG PO TABS: 80.0000 mg | ORAL_TABLET | Freq: Every day | ORAL | 1 refills | Status: DC

## 2020-08-29 MED ORDER — ISOSORBIDE MONONITRATE ER 30 MG PO TB24: 60.0000 mg | ORAL_TABLET | Freq: Every day | ORAL | 1 refills | Status: DC

## 2020-08-29 MED ORDER — CARVEDILOL 3.125 MG PO TABS: 3.1250 mg | ORAL_TABLET | Freq: Two times a day (BID) | ORAL | 1 refills | Status: DC

## 2020-08-29 MED ORDER — TICAGRELOR 60 MG PO TABS: 60.0000 mg | ORAL_TABLET | Freq: Two times a day (BID) | ORAL | 1 refills | Status: DC

## 2020-08-29 MED ORDER — NITROGLYCERIN 0.4 MG SL SUBL: 0.4000 mg | SUBLINGUAL_TABLET | SUBLINGUAL | 3 refills | Status: AC | PRN

## 2020-08-29 MED ORDER — TORSEMIDE 20 MG PO TABS: 40.0000 mg | ORAL_TABLET | Freq: Every day | ORAL | 3 refills | Status: DC

## 2020-08-29 MED ORDER — POTASSIUM CHLORIDE CRYS ER 20 MEQ PO TBCR: 20.0000 meq | EXTENDED_RELEASE_TABLET | Freq: Every day | ORAL | 3 refills | Status: DC

## 2020-08-29 MED ORDER — POTASSIUM CHLORIDE CRYS ER 20 MEQ PO TBCR: 20.0000 meq | EXTENDED_RELEASE_TABLET | Freq: Every day | ORAL | 1 refills | Status: DC

## 2020-08-29 NOTE — Addendum Note (Signed)
Addended by: Alyson Ingles on: 08/29/2020 02:07 PM   Modules accepted: Orders

## 2020-08-29 NOTE — Patient Instructions (Signed)
Medication Instructions:  Take TORSEMIDE 40mg  twice daily x3 days then back to 40mg  daily  Take POTASSIUM 20mg  twice daily x3 days then back to 20mg  daily *If you need a refill on your cardiac medications before your next appointment, please call your pharmacy*  Lab Work: BMET AND MAG TODAY If you have labs (blood work) drawn today and your tests are completely normal, you will receive your results only by:  MyChart Message (if you have MyChart) OR A paper copy in the mail.  If you have any lab test that is abnormal or we need to change your treatment, we will call you to review the results. You may go to any [facility] that is convenient for you however, we do have a lab in our office that is able to assist you. You DO NOT need an appointment for our lab. The lab is open 8:00am and closes at 4:00pm. Lunch 12:45 - 1:45pm.  Special Instructions CLEARED FOR CATARACT SURGERY  Follow-Up: Your next appointment:  KEEP SCHEDULED APPOINTMENT  In Person with Peter , MD   At [facility], you and your health needs are our priority.  As part of our continuing mission to provide you with exceptional heart care, we have created designated Provider Care Teams.  These Care Teams include your primary Cardiologist (physician) and Advanced Practice Providers (APPs -  Physician Assistants and Nurse Practitioners) who all work together to provide you with the care you need, when you need it.

## 2020-08-30 LAB — BASIC METABOLIC PANEL
BUN/Creatinine Ratio: 16 (ref 12–28)
BUN: 29 mg/dL — ABNORMAL HIGH (ref 8–27)
CO2: 23 mmol/L (ref 20–29)
Calcium: 10 mg/dL (ref 8.7–10.3)
Chloride: 103 mmol/L (ref 96–106)
Creatinine, Ser: 1.8 mg/dL — ABNORMAL HIGH (ref 0.57–1.00)
GFR calc Af Amer: 33 mL/min/{1.73_m2} — ABNORMAL LOW (ref >59)
GFR calc non Af Amer: 28 mL/min/{1.73_m2} — ABNORMAL LOW (ref >59)
Glucose: 79 mg/dL (ref 65–99)
Potassium: 4.8 mmol/L (ref 3.5–5.2)
Sodium: 143 mmol/L (ref 134–144)

## 2020-08-30 LAB — MAGNESIUM: Magnesium: 2.1 mg/dL (ref 1.6–2.3)

## 2020-09-05 ENCOUNTER — Encounter: Payer: Self-pay | Admitting: Cardiology

## 2020-09-05 ENCOUNTER — Telehealth (INDEPENDENT_AMBULATORY_CARE_PROVIDER_SITE_OTHER): Payer: Medicare PPO | Admitting: Cardiology

## 2020-09-05 ENCOUNTER — Ambulatory Visit: Payer: Medicare PPO | Admitting: Cardiology

## 2020-09-05 DIAGNOSIS — N1832 Chronic kidney disease, stage 3b: Secondary | ICD-10-CM | POA: Diagnosis not present

## 2020-09-05 DIAGNOSIS — I5043 Acute on chronic combined systolic (congestive) and diastolic (congestive) heart failure: Secondary | ICD-10-CM | POA: Diagnosis not present

## 2020-09-05 DIAGNOSIS — I2510 Atherosclerotic heart disease of native coronary artery without angina pectoris: Secondary | ICD-10-CM

## 2020-09-05 DIAGNOSIS — I255 Ischemic cardiomyopathy: Secondary | ICD-10-CM

## 2020-09-05 NOTE — Progress Notes (Signed)
Virtual Visit via Telephone Note   This visit type was conducted due to national recommendations for restrictions regarding the COVID-19 Pandemic (e.g. social distancing) in an effort to limit this patient's exposure and mitigate transmission in our community.  Due to her co-morbid illnesses, this patient is at least at moderate risk for complications without adequate follow up.  This format is felt to be most appropriate for this patient at this time.  The patient did not have access to video technology/had technical difficulties with video requiring transitioning to audio format only (telephone).  All issues noted in this document were discussed and addressed.  No physical exam could be performed with this format.  Please refer to the patient's chart for her  consent to telehealth for [facility].    Date:  09/05/2020   ID:  Deborah Ho, DOB 1951/05/22, MRN [id number] The patient was identified using 2 identifiers.  Patient Location: Home Provider Location: Office/Clinic  PCP:  Grayce Sessions, NP  Cardiologist:  Mikhaila Roh [place], MD  Electrophysiologist:  None   Evaluation Performed:  Follow-Up Visit  Chief Complaint:  CHF  History of Present Illness:    Deborah Ho is a 69 y.o. female with a PMH of CAD s/p STEMI with PCI/DES to LAD x2 in 2015, chronic combined CHF/ischemic cardiomyopathy, HTN, HLD, tobacco abuse, and non-compliance who is visited with today for follow up of her CHF.   She has a longstanding history of non-compliance and as a result se was fired from the [facility] Failure clinic. She was last evaluated by cardiology at an outpatient visit with Dr. [place] 11/2019 at which time she was doing well from a cardiac standpoint after maintaining compliance with her diuretic therapy. No medication changes occurred at this visit and she was recommended to follow-up in 2 months. She missed a couple visits since that time. Her last echocardiogram 11/2018 showed EF  <15% with diffuse hypokinesis, G3DD, mild RV systolic dysfunction, severely dilated RA, moderate MR, and moderate TR. Her last ischemic evaluation appears to be a LHC in 2015 when she experienced thrombosis of her previously placed LAD stent which was managed with overlapping PCI/DES with residual moderate LCx and severe RCA stenosis which was medically managed. She was seen by Judy Pimple PA-C last week and was noted to have significant weight gain. It was recommended that she increase torsemide to 40 mg bid. The patient reports she did so and the fluid "just came right off" although she cannot tell me her latest weight. States he weight is always below 200 lbs but last week was a fluke. Denies any SOB, edema, or increased abdominal girth. Unable to provide vital signs this am. Reports compliance with fluid and sodium restriction.   The patient does not have symptoms concerning for COVID-19 infection (fever, chills, cough, or new shortness of breath).    Past Medical History:  Diagnosis Date   CAD (coronary artery disease) 05/03/14; 05/09/14   a. anterior STEMI with early in-stent thorombosis for missed dose of Brillinta s/p PCI with DESx 2 into LAD (04/2014)   CHF (congestive heart failure) (HCC)    GERD (gastroesophageal reflux disease)    Probable   HTN (hypertension)    Hyperlipidemia    Ischemic cardiomyopathy    a. 04/2014 ECHO with EF 45-50% b.  Repeat 2D echo 08/14/14 with EF down at 15%. Life vest placed   Non compliance w medication regimen    Tobacco use    Past Surgical History:  Procedure Laterality Date   CHOLECYSTECTOMY     CORONARY ANGIOPLASTY WITH STENT PLACEMENT  05/03/14   STEMI- stent to LAD DES- [facility]pine   CORONARY ANGIOPLASTY WITH STENT PLACEMENT  05/09/14   STEMI- overlapping stent to LAD, pt had missed a dose of Brilinta   LEFT HEART CATHETERIZATION WITH CORONARY ANGIOGRAM N/A 05/03/2014   Procedure: LEFT HEART CATHETERIZATION WITH CORONARY ANGIOGRAM;   Surgeon: Avyaan Summer M [place], MD;  Location: [facility] CATH LAB;  Service: Cardiovascular;  Laterality: N/A;   LEFT HEART CATHETERIZATION WITH CORONARY ANGIOGRAM N/A 05/09/2014   Procedure: LEFT HEART CATHETERIZATION WITH CORONARY ANGIOGRAM;  Surgeon: Corky Crafts, MD;  Location: [facility] CATH LAB;  Service: Cardiovascular;  Laterality: N/A;   PARTIAL HYSTERECTOMY     PERCUTANEOUS STENT INTERVENTION  05/03/2014   Procedure: PERCUTANEOUS STENT INTERVENTION;  Surgeon: Nevyn Bossman M [place], MD;  Location: [facility] CATH LAB;  Service: Cardiovascular;;  DES Prox LAD      Current Meds  Medication Sig   atorvastatin (LIPITOR) 80 MG tablet Take 1 tablet (80 mg total) by mouth daily.   carboxymethylcellulose (REFRESH PLUS) 0.5 % SOLN Place 1 drop into both eyes 3 (three) times daily as needed (dry eyes).   carvedilol (COREG) 3.125 MG tablet Take 1 tablet (3.125 mg total) by mouth 2 (two) times daily with a meal.   cetirizine (ZYRTEC) 10 MG tablet Take 10 mg daily by mouth.    cholecalciferol (VITAMIN D) 1000 units tablet Take 1,000 Units by mouth every 14 (fourteen) days. Twice a month   fluticasone (FLONASE) 50 MCG/ACT nasal spray Place 1 spray into both nostrils daily as needed for allergies (congestion).    isosorbide mononitrate (IMDUR) 30 MG 24 hr tablet Take 2 tablets (60 mg total) by mouth daily.   magnesium oxide (MAG-OX) 400 (241.3 Mg) MG tablet Take 400 mg by mouth every 14 (fourteen) days. Twice a month   nitroGLYCERIN (NITROSTAT) 0.4 MG SL tablet Place 1 tablet (0.4 mg total) under the tongue every 5 (five) minutes as needed for chest pain.   potassium chloride SA (KLOR-CON) 20 MEQ tablet Take 1 tablet (20 mEq total) by mouth daily. Take 20mg  twice daily x3 days then back to 20mg  daily   spironolactone (ALDACTONE) 25 MG tablet Take 1 tablet (25 mg total) by mouth daily.   ticagrelor (BRILINTA) 60 MG TABS tablet Take 1 tablet (60 mg total) by mouth 2 (two) times daily.   torsemide (DEMADEX) 20 MG  tablet Take 2 tablets (40 mg total) by mouth daily. Take 40mg  twice daily x3 days then back to 40mg  daily     Allergies:   Aspirin, Effient [prasugrel], Entresto [sacubitril-valsartan], Lactose intolerance (gi), Robitussin dm [guaifenesin-dm], Sulfa antibiotics, and Other   Social History   Tobacco Use   Smoking status: Former Smoker    Packs/day: 0.10    Years: 0.50    Pack years: 0.05    Types: Cigarettes   Smokeless tobacco: Former Neurosurgeon   Tobacco comment: down to 3 cigarettes daily (08/03/14)  Vaping Use   Vaping Use: Never used  Substance Use Topics   Alcohol use: No   Drug use: No     Family Hx: The patient's family history includes Diabetes in her mother; Hypertension in her mother.  ROS:   Please see the history of present illness.    All other systems reviewed and are negative.   Prior CV studies:   The following studies were reviewed today:  Echocardiogram 11/2018: - Left ventricle: The  cavity size was normal. Wall thickness was  increased in a pattern of mild LVH. The estimated ejection  fraction was 15%. Diffuse hypokinesis. No mural thrombus by  Definity contrast. Doppler parameters are consistent with  restrictive left ventricular relaxation (grade 3 diastolic  dysfunction). The E/e&' ratio is >20, suggesting markedly elevated  LV filling pressure.  - Mitral valve: Mildly thickened leaflets . There was moderate  regurgitation.  - Left atrium: The atrium was normal in size.  - Right ventricle: The cavity size was moderately dilated. Mild  systolic dysfunction.  - Right atrium: Severely dilated.  - Tricuspid valve: There was moderate regurgitation.  - Pulmonary arteries: PA peak pressure: 53 mm Hg (S).  - Inferior vena cava: The vessel was dilated. The respirophasic  diameter changes were blunted (< 50%), consistent with elevated  central venous pressure.   Impressions:   - Compared to a prior study in 11/2017, the LVEF is  unchanged at  15%. Left and right heart pressures are quite elevated.   Labs/Other Tests and Data Reviewed:    EKG:  No ECG reviewed.  Recent Labs: 10/11/2019: B Natriuretic Peptide 2,307.0 10/20/2019: ALT 24 10/21/2019: Hemoglobin 13.5; Platelets 196 08/29/2020: BUN 29; Creatinine, Ser 1.80; Magnesium 2.1; Potassium 4.8; Sodium 143   Recent Lipid Panel Lab Results  Component Value Date/Time   CHOL 122 10/19/2019 05:19 AM   TRIG 72 10/19/2019 05:19 AM   HDL 38 (L) 10/19/2019 05:19 AM   CHOLHDL 3.2 10/19/2019 05:19 AM   LDLCALC 70 10/19/2019 05:19 AM    Wt Readings from Last 3 Encounters:  08/29/20 213 lb 12.8 oz (97 kg)  03/16/20 190 lb 2 oz (86.2 kg)  03/13/20 182 lb (82.6 kg)     Risk Assessment/Calculations:      Objective:    Vital Signs:  There were no vitals taken for this visit.   VITAL SIGNS:  reviewed no vital signs to review  ASSESSMENT & PLAN:    1. CAD s/p PCI/DES to LAD x2 in 2015: She has no anginal complaints. No utility in obtaining a NST which would be grossly abnormal given her cardiomyopathy and in the setting of no symptoms would not send her to the cath lab. Will continue medical management at this point.  - Continue brilinta and statin - Continue BBlocker and imdur  2. Acute on chronic combined CHF/ischemic cardiomyopathy: Weight was up last week 23lbs from 03/2020. She reports no symptoms today and states she responded well to increased torsemide for 3 days. I asked her to check vital signs today and weight and report back to me.  - Will continue torsemide at current dose.  - Continue carvedilol, spironolactone, and torsemide  - recommend follow up in 3 months. - continue sodium and fluid restriction.   3. HTN: BP OK last week.   4. HLD: LDL 70 10/2019 - Continue atorvastatin  5. Tobacco abuse:  - Continue to encourage smoking cessation  6. CKD stage 3: Cr 1.8  With increased diuresis. Monitor.     COVID-19 Education: The  signs and symptoms of COVID-19 were discussed with the patient and how to seek care for testing (follow up with PCP or arrange E-visit).  The importance of social distancing was discussed today.  Time:   Today, I have spent 10 minutes with the patient with telehealth technology discussing the above problems.     Medication Adjustments/Labs and Tests Ordered: Current medicines are reviewed at length with the patient today.  Concerns regarding  medicines are outlined above.   Tests Ordered: No orders of the defined types were placed in this encounter.   Medication Changes: No orders of the defined types were placed in this encounter.   Follow Up:  In Person in 3 month(s)  Signed, Stacye Noori [place], MD  09/05/2020 8:14 AM    [facility]

## 2020-09-05 NOTE — Patient Instructions (Signed)
Medication Instructions:  Continue same medications *If you need a refill on your cardiac medications before your next appointment, please call your pharmacy*   Lab Work: None ordered   Testing/Procedures: None ordered   Follow-Up: At [facility], you and your health needs are our priority.  As part of our continuing mission to provide you with exceptional heart care, we have created designated Provider Care Teams.  These Care Teams include your primary Cardiologist (physician) and Advanced Practice Providers (APPs -  Physician Assistants and Nurse Practitioners) who all work together to provide you with the care you need, when you need it.  We recommend signing up for the patient portal called "MyChart".  Sign up information is provided on this After Visit Summary.  MyChart is used to connect with patients for Virtual Visits (Telemedicine).  Patients are able to view lab/test results, encounter notes, upcoming appointments, etc.  Non-urgent messages can be sent to your provider as well.   To learn more about what you can do with MyChart, go to [email or web address].    Your next appointment:  Friday 12/06/20 at 1:40 pm   The format for your next appointment: Office   Provider:  Dr.Jordan

## 2020-12-03 NOTE — Progress Notes (Deleted)
Cardiology Office Note    Date:  12/03/2020   ID:  Deborah Ho, DOB Dec 16, 1950, MRN [id number]  PCP:  Grayce Sessions, NP  Cardiologist:  Dr. Swaziland / Dr. Shirlee Latch - CHF clinic   No chief complaint on file.   History of Present Illness:  Deborah Ho is a 70 y.o. female with past medical history of hypertension, hyperlipidemia, tobacco abuse, CAD, ischemic cardiomyopathy, and chronic systolic heart failure. She is seen for post hospital follow up.  Patient had a history of anterior STEMI on 05/03/2014 with early in-stent thrombosis for missed dose of Brilinta, this was treated with 2 drug-eluting stents to the LAD on 05/09/2014.  Echocardiogram obtained on 05/10/2014 showed EF 45 to 50%.  However repeat echocardiogram on 08/14/2014 showed EF 15%, grade 3 DD.  Ejection fraction improved to 25% by September 2016 on echocardiogram.  Since then, her ejection fraction has been hovering between 15% to 20% on multiple echocardiogram.  Last echocardiogram was obtained during her admission for acute heart failure in January 2019, this showed EF 10 to 15%, doppler parameters consistent with restrictive physiology, moderate TR, PA peak pressure 31 mmHg.  Her last heart failure clinic visit was on November 18, 2017, her weight at the time was 231 whereas her baseline dry weight is 206 pounds.  This is significant weight gain despite increasing torsemide to 60 mg twice daily, she also refused to rechallenge Entresto.  She was directly admitted to [facility].  She was diuresed with IV Lasix and metolazone and was eventually transitioned to torsemide 80 mg twice daily.  Heart failure medication was titrated as tolerated, however patient continued to refuse aggressive titration or additional beta-blocker.  She was eventually diuresed to 16.1 L and a discharged with a new dry weight of 192 pounds.  She failed to follow-up with heart failure clinic as outpatient and ended up back in the hospital on 03/11/2018  with weight of 243 pounds.  She was eventually discharged on 04/14/2018 with dry weight of 204 pounds.  She was discharged on Lasix 80 mg twice daily, spironolactone 25 mg daily, and metolazone 3 times weekly. Since her initial event in 2015 she has had repeated hospital admissions for CHF exacerbation. She has been repeatedly noncompliant with medical therapy and outpatient follow up.   She was admitted in June 2020 with CHF exacerbation. Seen by Heart failure service. She was markedly volume overloaded.  Diuresed with lasix drip and later transitioned to torsemide 60 mg daily. Diuresed over 45 pounds. HF medications optimized. Discharge weight 191 pounds. She has refused ICD. Diuresed with lasix drip and metolazone. Once adequately diuresed she was placed on torsemide 60 mg daily. HF medications optimized.  Continued on spironolactone 25 mg daily,Coreg 3.125 mg bid. Placed on bidil but she has no insurance coverage. Switched to hydralazine 37.5 mg tid and imdur 60 daily.   She was seen in November 2020.  She was not taking her diuretics and had gained a significant amount of weight. We resumed her diuretics but she returned to the hospital the following day with CHF exacerbation. She had significant ascites. She was  diuresed18.5L. DC weight was 91.3 kg. Total bilirubin has remained elevated throughout this inpatient stay and has been elevated since last spring. Both direct and indirect bilirubin have been elevated. MRCP was performed today and was nondiagnostic for this issue due to massive ascites. Recommended follow up with primary care for her ascites and elevated LFTs. She was noted to continue  to have edema and ascites but did not want to stay longer to get to dry weight. She was DC on torsemide 40 mg daily and spironolactone.   She was last seen in October 2021. Significant weight gain noted. Torsemide increased and patient reported the fluid came right off.   On follow up today she continues to  do well. She reports compliance with diuretic therapy and weight is down. No abdominal swelling or edema. Skin is dry. No dyspnea or chest pain. Needs refills on cardiac meds. She is no longer eating TV dinners.    Past Medical History:  Diagnosis Date  . CAD (coronary artery disease) 05/03/14; 05/09/14   a. anterior STEMI with early in-stent thorombosis for missed dose of Brillinta s/p PCI with DESx 2 into LAD (04/2014)  . CHF (congestive heart failure) (HCC)   . GERD (gastroesophageal reflux disease)    Probable  . HTN (hypertension)   . Hyperlipidemia   . Ischemic cardiomyopathy    a. 04/2014 ECHO with EF 45-50% b.  Repeat 2D echo 08/14/14 with EF down at 15%. Life vest placed  . Non compliance w medication regimen   . Tobacco use     Past Surgical History:  Procedure Laterality Date  . CHOLECYSTECTOMY    . CORONARY ANGIOPLASTY WITH STENT PLACEMENT  05/03/14   STEMI- stent to LAD DES- Xience alpine  . CORONARY ANGIOPLASTY WITH STENT PLACEMENT  05/09/14   STEMI- overlapping stent to LAD, pt had missed a dose of Brilinta  . LEFT HEART CATHETERIZATION WITH CORONARY ANGIOGRAM N/A 05/03/2014   Procedure: LEFT HEART CATHETERIZATION WITH CORONARY ANGIOGRAM;  Surgeon: Bomani Oommen M Swazil[place], MD;  Location: [facility] CATH LAB;  Service: Cardiovascular;  Laterality: N/A;  . LEFT HEART CATHETERIZATION WITH CORONARY ANGIOGRAM N/A 05/09/2014   Procedure: LEFT HEART CATHETERIZATION WITH CORONARY ANGIOGRAM;  Surgeon: Corky Crafts, MD;  Location: [facility] CATH LAB;  Service: Cardiovascular;  Laterality: N/A;  . PARTIAL HYSTERECTOMY    . PERCUTANEOUS STENT INTERVENTION  05/03/2014   Procedure: PERCUTANEOUS STENT INTERVENTION;  Surgeon: Kwali Wrinkle M Swazil[place], MD;  Location: [facility] CATH LAB;  Service: Cardiovascular;;  DES Prox LAD     Current Medications: Outpatient Medications Prior to Visit  Medication Sig Dispense Refill  . atorvastatin (LIPITOR) 80 MG tablet Take 1 tablet (80 mg total) by mouth daily. 90 tablet 1  .  carboxymethylcellulose (REFRESH PLUS) 0.5 % SOLN Place 1 drop into both eyes 3 (three) times daily as needed (dry eyes).    . carvedilol (COREG) 3.125 MG tablet Take 1 tablet (3.125 mg total) by mouth 2 (two) times daily with a meal. 180 tablet 1  . cetirizine (ZYRTEC) 10 MG tablet Take 10 mg daily by mouth.     . cholecalciferol (VITAMIN D) 1000 units tablet Take 1,000 Units by mouth every 14 (fourteen) days. Twice a month    . ferrous sulfate 325 (65 FE) MG tablet Take 1 tablet (325 mg total) by mouth 3 (three) times a week. 13 tablet 0  . fluticasone (FLONASE) 50 MCG/ACT nasal spray Place 1 spray into both nostrils daily as needed for allergies (congestion).     . isosorbide mononitrate (IMDUR) 30 MG 24 hr tablet Take 2 tablets (60 mg total) by mouth daily. 90 tablet 1  . magnesium oxide (MAG-OX) 400 (241.3 Mg) MG tablet Take 400 mg by mouth every 14 (fourteen) days. Twice a month    . nitroGLYCERIN (NITROSTAT) 0.4 MG SL tablet Place 1 tablet (0.4 mg total) under  the tongue every 5 (five) minutes as needed for chest pain. 25 tablet 3  . potassium chloride SA (KLOR-CON) 20 MEQ tablet Take 1 tablet (20 mEq total) by mouth daily. Take 20mg  twice daily x3 days then back to 20mg  daily 36 tablet 1  . spironolactone (ALDACTONE) 25 MG tablet Take 1 tablet (25 mg total) by mouth daily. 90 tablet 1  . ticagrelor (BRILINTA) 60 MG TABS tablet Take 1 tablet (60 mg total) by mouth 2 (two) times daily. 180 tablet 1  . torsemide (DEMADEX) 20 MG tablet Take 2 tablets (40 mg total) by mouth daily. Take 40mg  twice daily x3 days then back to 40mg  daily 36 tablet 3   No facility-administered medications prior to visit.     Allergies:   Aspirin, Effient [prasugrel], Entresto [sacubitril-valsartan], Lactose intolerance (gi), Robitussin dm [guaifenesin-dm], Sulfa antibiotics, and Other   Social History   Socioeconomic History  . Marital status: Widowed    Spouse name: Not on file  . Number of children: Not on  file  . Years of education: Not on file  . Highest education level: Not on file  Occupational History  . Occupation: not employed  Tobacco Use  . Smoking status: Former Smoker    Packs/day: 0.10    Years: 0.50    Pack years: 0.05    Types: Cigarettes  . Smokeless tobacco: Former Neurosurgeon  . Tobacco comment: down to 3 cigarettes daily (08/03/14)  Vaping Use  . Vaping Use: Never used  Substance and Sexual Activity  . Alcohol use: No  . Drug use: No  . Sexual activity: Not on file  Other Topics Concern  . Not on file  Social History Narrative   Patient has 6 brothers and sisters and none have known coronary artery disease. She lives alone in [place], but has several brothers in the area.   Social Determinants of Health   Financial Resource Strain: Not on file  Food Insecurity: Not on file  Transportation Needs: Not on file  Physical Activity: Not on file  Stress: Not on file  Social Connections: Not on file     Family History:  The patient's family history includes Diabetes in her mother; Hypertension in her mother.   ROS:   Please see the history of present illness.    ROS All other systems reviewed and are negative.   PHYSICAL EXAM:   VS:  There were no vitals taken for this visit.   GENERAL:  WD BF in NAD HEENT:  PERRL, EOMI, sclera are clear. Oropharynx is clear. NECK:  JVD is not elevated, carotid upstroke brisk and symmetric, no bruits, no thyromegaly or adenopathy LUNGS:  Clear to auscultation bilaterally CHEST:  Unremarkable HEART:  RRR,  PMI not displaced or sustained,S1 and S2 within normal limits, no S3, no S4: no clicks, no rubs, no murmurs ABD:  Soft, nontender. BS +,,  abd soft without distension. EXT:  2 + pulses throughout, no edema, no cyanosis no clubbing SKIN:  Warm and dry.  No rashes NEURO:  Alert and oriented x 3. Cranial nerves II through XII intact. PSYCH:  Cognitively intact    Wt Readings from Last 3 Encounters:  08/29/20 213 lb 12.8 oz  (97 kg)  03/16/20 190 lb 2 oz (86.2 kg)  03/13/20 182 lb (82.6 kg)      Studies/Labs Reviewed:   EKG:  EKG is not ordered today.    Recent Labs: 08/29/2020: BUN 29; Creatinine, Ser 1.80; Magnesium 2.1; Potassium 4.8;  Sodium 143   Lipid Panel    Component Value Date/Time   CHOL 122 10/19/2019 0519   TRIG 72 10/19/2019 0519   HDL 38 (L) 10/19/2019 0519   CHOLHDL 3.2 10/19/2019 0519   VLDL 14 10/19/2019 0519   LDLCALC 70 10/19/2019 0519    Additional studies/ records that were reviewed today include:   Cath 05/03/2014 PROCEDURAL FINDINGS Hemodynamics: AO 162/105 mean 130 mm Hg LV 158/26 mm Hg  Coronary angiography: Coronary dominance: right  Left mainstem: Normal  Left anterior descending (LAD): 95% proximal LAD stenosis with TIMI 2 flow.  Ramus intermediate: moderate sized vessel. Mild disease less than 20%.  Left circumflex (LCx): The LCx has 30% proximal disease. The LCx trifurcates into 3 OM branches. After the first branch there is a 60-70% stenosis in the OM prior to the next bifurcation.   Right coronary artery (RCA): The RCA arises low in the coronary cusp and was engaged with a Williams right catheter. This demonstrates a long segment of disease with 70-80% disease proximally tapering to 80-90% in the mid vessel.   Left ventriculography: Left ventricular systolic function is abnormal, LVEF is estimated at 35%, there is severe anterior hypokinesis with akinesis of the anterior apex and inferior apex. There is mild mitral regurgitation   PCI Note: Following the diagnostic procedure, the decision was made to proceed with PCI. Weight-based bivalirudin was given for anticoagulation. Brilinta 180 mg was given orally. Once a therapeutic ACT was achieved, a 6 [place] XBLAD 3.5 guide catheter was inserted. A prowater coronary guidewire was used to cross the lesion. The lesion was predilated with a 2.0 mm balloon. The lesion was then stented with a 3.0 x 23 mm  Xience Alpine stent. The stent was postdilated with a 3.25 mm noncompliant balloon. Following PCI, there was 0% residual stenosis and TIMI-3 flow. Final angiography confirmed an excellent result. The patient tolerated the procedure well. There were no immediate procedural complications. A TR band was used for radial hemostasis. The patient was transferred to the post catheterization recovery area for further monitoring.  PCI Data: Vessel - LAD/Segment - proximal Percent Stenosis (pre) 95% TIMI-flow 2 Stent 3.0 x 23 mm Xience Alpine Percent Stenosis (post) 0% TIMI-flow (post) 3  Final Conclusions:  1. Severe 3 vessel obstructive CAD. Culprit lesion in the proximal LAD. 2. Severe LV dysfunction. 3. Successful stenting of the proximal LAD with a DES.   Recommendations:  Continue DAPT for one year. IV diuresis. BP control. May consider patient for enrollment in Complete trial for her residual disease.    Cath 05/09/2014 IMPRESSIONS:  1. Normal left main coronary artery. 2. Subacute stent thrombosis of the day stent in the mid left anterior descending artery. Successful thrombectomy was performed with restoration of TIMI-3 flow. Intravascular ultrasound revealed a vessel diameter of about 3.5 mm. An overlapping stent was placed. This is a 3.0 x 23, overlapping the distal edge of the prior stent. The entire stented segment was post dilated with a 3.5 x 20 noncompliant balloon. Intravascular ultrasound was performed again showing excellent stent apposition and large lumen diameter.. 3. Moderate disease in the left circumflex artery and its branches. 4. Severe disease in the right coronary artery. 5. Left ventricular systolic function not assessed. LVEDP 23 mmHg. Marland Kitchen  RECOMMENDATION:Continue dual antiplatelet therapy. IV tirofiban will be continued as well. Continue medical therapy for RCA disease. We stressed the importance of taking her medications as prescribed. Continue  aggressive secondary prevention. She would benefit from cardiac rehabilitation.  We'll watch in the ICU.    Echo 11/21/2017 LV EF: 10% - 15%  ------------------------------------------------------------------- Indications: CHF - 428.0.  ------------------------------------------------------------------- History: PMH: Coronary artery disease. Congestive heart failure. Cardiomyopathy of unknown etiology. PMH: Myocardial infarction. Risk factors: Current tobacco use. Hypertension. Dyslipidemia.  ------------------------------------------------------------------- Study Conclusions  - Left ventricle: The cavity size was mildly dilated. Wall thickness was normal. The estimated ejection fraction was in the range of 10% to 15%. Doppler parameters are consistent with restrictive physiology, indicative of decreased left ventricular diastolic compliance and/or increased left atrial pressure. - Mitral valve: There was mild regurgitation. - Left atrium: The atrium was mildly dilated. - Right ventricle: The cavity size was mildly dilated. Systolic function was moderately to severely reduced. - Tricuspid valve: There was moderate regurgitation. - Pulmonary arteries: PA peak pressure: 31 mm Hg (S).  Impressions:  - Technically very limited study due to poor sound wave transmission. There severe global LV dysfunction with moderate to sevre RV dysfunction.  Echo 11/28/18: Study Conclusions  - Left ventricle: The cavity size was normal. Wall thickness was   increased in a pattern of mild LVH. The estimated ejection   fraction was 15%. Diffuse hypokinesis. No mural thrombus by   Definity contrast. Doppler parameters are consistent with   restrictive left ventricular relaxation (grade 3 diastolic   dysfunction). The E/e&' ratio is >20, suggesting markedly elevated   LV filling pressure. - Mitral valve: Mildly thickened leaflets . There was moderate    regurgitation. - Left atrium: The atrium was normal in size. - Right ventricle: The cavity size was moderately dilated. Mild   systolic dysfunction. - Right atrium: Severely dilated. - Tricuspid valve: There was moderate regurgitation. - Pulmonary arteries: PA peak pressure: 53 mm Hg (S). - Inferior vena cava: The vessel was dilated. The respirophasic   diameter changes were blunted (< 50%), consistent with elevated   central venous pressure.    ASSESSMENT:    No diagnosis found.   PLAN:  In order of problems listed above:  1.  Chronic systolic/diastolic heart failure:  She seems to be compliant with  Torsemide. Stressed importance of compliance with medication and sodium restriction. Weigh daily and let us know if it is increasing.  Otherwise continue Coreg, spironolactone, imdur and hydralazine. Will follow up in 2 months with CMET and BNP.   2. Ischemic cardiomyopathy: EF 15-20%. Chronic.  She could not tolerate Entresto due to side effect of dizziness.    3. Hypertension: Blood pressure stable  4. Hyperlipidemia: On Lipitor 40 mg daily  5. CAD s/p remote anterior MI with early stent thrombosis due to noncompliance. On chronic Brilinta.   6.   Elevated LFTs/bili with ascites. Suspect mostly congestive hepatopathy. Will check LFTs next visit.     Medication Adjustments/Labs and Tests Ordered: Current medicines are reviewed at length with the patient today.  Concerns regarding medicines are outlined above.  Medication changes, Labs and Tests ordered today are listed in the Patient Instructions below. There are no Patient Instructions on file for this visit.   Signed, Christalynn Boise Swaziland, MD  12/03/2020 4:35 PM    [facility] [address] [place], [place], [place]  [address] Phone: [phone]; Fax: [phone]

## 2020-12-05 ENCOUNTER — Ambulatory Visit: Payer: Medicare PPO

## 2020-12-06 ENCOUNTER — Ambulatory Visit: Payer: Medicare PPO | Admitting: Cardiology

## 2020-12-30 ENCOUNTER — Other Ambulatory Visit: Payer: Self-pay | Admitting: Cardiology

## 2021-04-03 ENCOUNTER — Encounter: Payer: Self-pay | Admitting: Podiatry

## 2021-04-03 ENCOUNTER — Ambulatory Visit (INDEPENDENT_AMBULATORY_CARE_PROVIDER_SITE_OTHER): Payer: Medicare PPO

## 2021-04-03 ENCOUNTER — Other Ambulatory Visit: Payer: Self-pay

## 2021-04-03 ENCOUNTER — Ambulatory Visit: Payer: Medicare PPO | Admitting: Podiatry

## 2021-04-03 DIAGNOSIS — M79674 Pain in right toe(s): Secondary | ICD-10-CM

## 2021-04-03 DIAGNOSIS — M779 Enthesopathy, unspecified: Secondary | ICD-10-CM

## 2021-04-03 DIAGNOSIS — M79675 Pain in left toe(s): Secondary | ICD-10-CM

## 2021-04-03 DIAGNOSIS — M2041 Other hammer toe(s) (acquired), right foot: Secondary | ICD-10-CM | POA: Diagnosis not present

## 2021-04-03 DIAGNOSIS — M21619 Bunion of unspecified foot: Secondary | ICD-10-CM | POA: Diagnosis not present

## 2021-04-03 DIAGNOSIS — B351 Tinea unguium: Secondary | ICD-10-CM | POA: Diagnosis not present

## 2021-04-03 MED ORDER — TRIAMCINOLONE ACETONIDE 10 MG/ML IJ SUSP
10.0000 mg | Freq: Once | INTRAMUSCULAR | Status: AC
  Administered 2021-04-03: 10 mg

## 2021-04-03 NOTE — Progress Notes (Signed)
Subjective:   Patient ID: Deborah Ho, female   DOB: 70 y.o.   MRN: [id number]   HPI Patient states she has a lot of pain in the right ankle and has pain with her nailbeds that she cannot take care of herself both feet along with structural bunion and hammertoe deformity bilateral that becomes increasingly sore with shoe gear.   ROS      Objective:  Physical Exam  Neurovascular status was found to be intact range of motion muscle strength adequate.  Patient does have structural malalignment with structural bunion deformity bilateral that is enlarged and has inflammation pain of the right sinus tarsi ankle joint and also thick yellow brittle nailbeds 1-5 both feet painful     Assessment:  Chronic structural conditions bilateral with bunion and digital deformities along with capsulitis right subtalar joint and thick yellow brittle mycotic nail infection 1-5 both feet      Plan:  H&P all conditions reviewed and I have recommended debridement of nailbeds which was accomplished sterile prep injected the sinus tarsi right 3 mg Kenalog 5 mg Xylocaine discussed bunions hammertoes do not recommend surgery and I did recommend wide type shoe gear mesh materials.  Patient will be seen back to recheck  X-rays indicate structural malalignment of both feet with elevation of the intermetatarsal angle and rotational component of the digits bilateral

## 2021-05-08 ENCOUNTER — Other Ambulatory Visit: Payer: Self-pay | Admitting: Cardiology

## 2021-05-08 ENCOUNTER — Other Ambulatory Visit: Payer: Self-pay | Admitting: Medical

## 2021-06-26 ENCOUNTER — Other Ambulatory Visit: Payer: Self-pay | Admitting: Cardiology

## 2021-08-05 ENCOUNTER — Inpatient Hospital Stay (HOSPITAL_COMMUNITY): Payer: Medicare PPO

## 2021-08-05 ENCOUNTER — Inpatient Hospital Stay (HOSPITAL_COMMUNITY)
Admission: EM | Admit: 2021-08-05 | Discharge: 2021-08-09 | DRG: 291 | Disposition: A | Discharge status: Home / Self Care | Payer: Medicare PPO | Attending: Internal Medicine | Admitting: Internal Medicine

## 2021-08-05 ENCOUNTER — Emergency Department (HOSPITAL_COMMUNITY): Payer: Medicare PPO

## 2021-08-05 DIAGNOSIS — D631 Anemia in chronic kidney disease: Secondary | ICD-10-CM | POA: Diagnosis present

## 2021-08-05 DIAGNOSIS — Z889 Allergy status to unspecified drugs, medicaments and biological substances status: Secondary | ICD-10-CM | POA: Diagnosis not present

## 2021-08-05 DIAGNOSIS — K219 Gastro-esophageal reflux disease without esophagitis: Secondary | ICD-10-CM | POA: Diagnosis present

## 2021-08-05 DIAGNOSIS — Z9049 Acquired absence of other specified parts of digestive tract: Secondary | ICD-10-CM | POA: Diagnosis not present

## 2021-08-05 DIAGNOSIS — I959 Hypotension, unspecified: Secondary | ICD-10-CM | POA: Diagnosis present

## 2021-08-05 DIAGNOSIS — Z9114 Patient's other noncompliance with medication regimen: Secondary | ICD-10-CM

## 2021-08-05 DIAGNOSIS — I420 Dilated cardiomyopathy: Secondary | ICD-10-CM | POA: Diagnosis not present

## 2021-08-05 DIAGNOSIS — I130 Hypertensive heart and chronic kidney disease with heart failure and stage 1 through stage 4 chronic kidney disease, or unspecified chronic kidney disease: Principal | ICD-10-CM | POA: Diagnosis present

## 2021-08-05 DIAGNOSIS — Z87891 Personal history of nicotine dependence: Secondary | ICD-10-CM

## 2021-08-05 DIAGNOSIS — Z90711 Acquired absence of uterus with remaining cervical stump: Secondary | ICD-10-CM | POA: Diagnosis not present

## 2021-08-05 DIAGNOSIS — Z955 Presence of coronary angioplasty implant and graft: Secondary | ICD-10-CM | POA: Diagnosis not present

## 2021-08-05 DIAGNOSIS — G4733 Obstructive sleep apnea (adult) (pediatric): Secondary | ICD-10-CM | POA: Diagnosis present

## 2021-08-05 DIAGNOSIS — R0602 Shortness of breath: Secondary | ICD-10-CM | POA: Diagnosis present

## 2021-08-05 DIAGNOSIS — E162 Hypoglycemia, unspecified: Secondary | ICD-10-CM | POA: Diagnosis not present

## 2021-08-05 DIAGNOSIS — Z882 Allergy status to sulfonamides status: Secondary | ICD-10-CM

## 2021-08-05 DIAGNOSIS — I5023 Acute on chronic systolic (congestive) heart failure: Secondary | ICD-10-CM | POA: Diagnosis present

## 2021-08-05 DIAGNOSIS — E669 Obesity, unspecified: Secondary | ICD-10-CM | POA: Diagnosis present

## 2021-08-05 DIAGNOSIS — D72829 Elevated white blood cell count, unspecified: Secondary | ICD-10-CM | POA: Diagnosis not present

## 2021-08-05 DIAGNOSIS — I5043 Acute on chronic combined systolic (congestive) and diastolic (congestive) heart failure: Secondary | ICD-10-CM | POA: Diagnosis not present

## 2021-08-05 DIAGNOSIS — T380X5A Adverse effect of glucocorticoids and synthetic analogues, initial encounter: Secondary | ICD-10-CM | POA: Diagnosis not present

## 2021-08-05 DIAGNOSIS — J9602 Acute respiratory failure with hypercapnia: Secondary | ICD-10-CM | POA: Diagnosis present

## 2021-08-05 DIAGNOSIS — N179 Acute kidney failure, unspecified: Secondary | ICD-10-CM | POA: Diagnosis present

## 2021-08-05 DIAGNOSIS — I252 Old myocardial infarction: Secondary | ICD-10-CM | POA: Diagnosis not present

## 2021-08-05 DIAGNOSIS — Z886 Allergy status to analgesic agent status: Secondary | ICD-10-CM

## 2021-08-05 DIAGNOSIS — E785 Hyperlipidemia, unspecified: Secondary | ICD-10-CM | POA: Diagnosis present

## 2021-08-05 DIAGNOSIS — U071 COVID-19: Secondary | ICD-10-CM | POA: Diagnosis present

## 2021-08-05 DIAGNOSIS — I255 Ischemic cardiomyopathy: Secondary | ICD-10-CM | POA: Diagnosis present

## 2021-08-05 DIAGNOSIS — J9690 Respiratory failure, unspecified, unspecified whether with hypoxia or hypercapnia: Secondary | ICD-10-CM

## 2021-08-05 DIAGNOSIS — Z888 Allergy status to other drugs, medicaments and biological substances status: Secondary | ICD-10-CM

## 2021-08-05 DIAGNOSIS — Z91011 Allergy to milk products: Secondary | ICD-10-CM

## 2021-08-05 DIAGNOSIS — Z7902 Long term (current) use of antithrombotics/antiplatelets: Secondary | ICD-10-CM

## 2021-08-05 DIAGNOSIS — N1832 Chronic kidney disease, stage 3b: Secondary | ICD-10-CM | POA: Diagnosis present

## 2021-08-05 DIAGNOSIS — I629 Nontraumatic intracranial hemorrhage, unspecified: Secondary | ICD-10-CM | POA: Diagnosis not present

## 2021-08-05 DIAGNOSIS — J9601 Acute respiratory failure with hypoxia: Secondary | ICD-10-CM | POA: Diagnosis present

## 2021-08-05 DIAGNOSIS — Z79899 Other long term (current) drug therapy: Secondary | ICD-10-CM

## 2021-08-05 DIAGNOSIS — I2510 Atherosclerotic heart disease of native coronary artery without angina pectoris: Secondary | ICD-10-CM | POA: Diagnosis present

## 2021-08-05 DIAGNOSIS — Z6838 Body mass index (BMI) 38.0-38.9, adult: Secondary | ICD-10-CM

## 2021-08-05 DIAGNOSIS — I5033 Acute on chronic diastolic (congestive) heart failure: Secondary | ICD-10-CM

## 2021-08-05 LAB — COMPREHENSIVE METABOLIC PANEL
ALT: 18 U/L (ref 0–44)
AST: 24 U/L (ref 15–41)
Albumin: 3.4 g/dL — ABNORMAL LOW (ref 3.5–5.0)
Alkaline Phosphatase: 171 U/L — ABNORMAL HIGH (ref 38–126)
Anion gap: 16 — ABNORMAL HIGH (ref 5–15)
BUN: 17 mg/dL (ref 8–23)
CO2: 16 mmol/L — ABNORMAL LOW (ref 22–32)
Calcium: 8.9 mg/dL (ref 8.9–10.3)
Chloride: 107 mmol/L (ref 98–111)
Creatinine, Ser: 1.59 mg/dL — ABNORMAL HIGH (ref 0.44–1.00)
GFR, Estimated: 35 mL/min — ABNORMAL LOW (ref >60)
Glucose, Bld: 234 mg/dL — ABNORMAL HIGH (ref 70–99)
Potassium: 3.9 mmol/L (ref 3.5–5.1)
Sodium: 139 mmol/L (ref 135–145)
Total Bilirubin: 0.7 mg/dL (ref 0.3–1.2)
Total Protein: 7 g/dL (ref 6.5–8.1)

## 2021-08-05 LAB — I-STAT ARTERIAL BLOOD GAS, ED
Acid-base deficit: 8 mmol/L — ABNORMAL HIGH (ref 0.0–2.0)
Acid-base deficit: 4 mmol/L — ABNORMAL HIGH (ref 0.0–2.0)
Bicarbonate: 22 mmol/L (ref 20.0–28.0)
Bicarbonate: 22.4 mmol/L (ref 20.0–28.0)
Calcium, Ion: 1.19 mmol/L (ref 1.15–1.40)
Calcium, Ion: 1.25 mmol/L (ref 1.15–1.40)
HCT: 41 % (ref 36.0–46.0)
HCT: 40 % (ref 36.0–46.0)
Hemoglobin: 13.9 g/dL (ref 12.0–15.0)
Hemoglobin: 13.6 g/dL (ref 12.0–15.0)
O2 Saturation: 100 %
O2 Saturation: 94 %
Potassium: 3.6 mmol/L (ref 3.5–5.1)
Potassium: 4.2 mmol/L (ref 3.5–5.1)
Sodium: 139 mmol/L (ref 135–145)
Sodium: 143 mmol/L (ref 135–145)
TCO2: 24 mmol/L (ref 22–32)
TCO2: 24 mmol/L (ref 22–32)
pCO2 arterial: 63.2 mmHg — ABNORMAL HIGH (ref 32.0–48.0)
pCO2 arterial: 44 mmHg (ref 32.0–48.0)
pH, Arterial: 7.149 — CL (ref 7.350–7.450)
pH, Arterial: 7.315 — ABNORMAL LOW (ref 7.350–7.450)
pO2, Arterial: 223 mmHg — ABNORMAL HIGH (ref 83.0–108.0)
pO2, Arterial: 78 mmHg — ABNORMAL LOW (ref 83.0–108.0)

## 2021-08-05 LAB — GLUCOSE, CAPILLARY
Glucose-Capillary: 106 mg/dL — ABNORMAL HIGH (ref 70–99)
Glucose-Capillary: 74 mg/dL (ref 70–99)
Glucose-Capillary: 76 mg/dL (ref 70–99)

## 2021-08-05 LAB — RESP PANEL BY RT-PCR (FLU A&B, COVID) ARPGX2
Influenza A by PCR: NEGATIVE
Influenza B by PCR: NEGATIVE
SARS Coronavirus 2 by RT PCR: POSITIVE — AB

## 2021-08-05 LAB — RAPID URINE DRUG SCREEN, HOSP PERFORMED
Amphetamines: NOT DETECTED
Barbiturates: NOT DETECTED
Benzodiazepines: NOT DETECTED
Cocaine: NOT DETECTED
Opiates: NOT DETECTED
Tetrahydrocannabinol: NOT DETECTED

## 2021-08-05 LAB — CBC WITH DIFFERENTIAL/PLATELET
Abs Immature Granulocytes: 0.25 K/uL — ABNORMAL HIGH (ref 0.00–0.07)
Basophils Absolute: 0.1 K/uL (ref 0.0–0.1)
Basophils Relative: 1 %
Eosinophils Absolute: 0.6 K/uL — ABNORMAL HIGH (ref 0.0–0.5)
Eosinophils Relative: 4 %
HCT: 47.8 % — ABNORMAL HIGH (ref 36.0–46.0)
Hemoglobin: 14.3 g/dL (ref 12.0–15.0)
Immature Granulocytes: 2 %
Lymphocytes Relative: 23 %
Lymphs Abs: 3.4 K/uL (ref 0.7–4.0)
MCH: 29.5 pg (ref 26.0–34.0)
MCHC: 29.9 g/dL — ABNORMAL LOW (ref 30.0–36.0)
MCV: 98.6 fL (ref 80.0–100.0)
Monocytes Absolute: 0.6 K/uL (ref 0.1–1.0)
Monocytes Relative: 4 %
Neutro Abs: 9.7 K/uL — ABNORMAL HIGH (ref 1.7–7.7)
Neutrophils Relative %: 66 %
Platelets: 321 K/uL (ref 150–400)
RBC: 4.85 MIL/uL (ref 3.87–5.11)
RDW: 12.5 % (ref 11.5–15.5)
WBC: 14.7 K/uL — ABNORMAL HIGH (ref 4.0–10.5)
nRBC: 0 % (ref 0.0–0.2)

## 2021-08-05 LAB — ECHOCARDIOGRAM COMPLETE
AR max vel: 2.35 cm2
AV Area VTI: 1.86 cm2
AV Area mean vel: 2.1 cm2
AV Mean grad: 2 mmHg
AV Peak grad: 4.1 mmHg
Ao pk vel: 1.01 m/s
Height: 65 in
MV M vel: 4.2 m/s
MV Peak grad: 70.6 mmHg
S' Lateral: 5.1 cm

## 2021-08-05 LAB — TROPONIN I (HIGH SENSITIVITY)
Troponin I (High Sensitivity): 17 ng/L
Troponin I (High Sensitivity): 57 ng/L — ABNORMAL HIGH (ref <18)
Troponin I (High Sensitivity): 61 ng/L — ABNORMAL HIGH (ref <18)

## 2021-08-05 LAB — PROTIME-INR
INR: 1 (ref 0.8–1.2)
Prothrombin Time: 13.2 s (ref 11.4–15.2)

## 2021-08-05 LAB — HIV ANTIBODY (ROUTINE TESTING W REFLEX): HIV Screen 4th Generation wRfx: NONREACTIVE

## 2021-08-05 LAB — BRAIN NATRIURETIC PEPTIDE: B Natriuretic Peptide: 680.9 pg/mL — ABNORMAL HIGH (ref 0.0–100.0)

## 2021-08-05 LAB — LACTIC ACID, PLASMA
Lactic Acid, Venous: 3.2 mmol/L (ref 0.5–1.9)
Lactic Acid, Venous: 2.8 mmol/L (ref 0.5–1.9)

## 2021-08-05 LAB — MRSA NEXT GEN BY PCR, NASAL: MRSA by PCR Next Gen: NOT DETECTED

## 2021-08-05 MED ORDER — ONDANSETRON HCL 4 MG/2ML IJ SOLN: 4.0000 mg | Freq: Four times a day (QID) | INTRAMUSCULAR | Status: DC | PRN

## 2021-08-05 MED ORDER — IOHEXOL 350 MG/ML SOLN
70.0000 mL | Freq: Once | INTRAVENOUS | Status: AC | PRN
  Administered 2021-08-05: 70 mL via INTRAVENOUS

## 2021-08-05 MED ORDER — FENTANYL CITRATE PF 50 MCG/ML IJ SOSY
25.0000 ug | PREFILLED_SYRINGE | Freq: Once | INTRAMUSCULAR | Status: AC
Start: 2021-08-05 — End: 2021-08-05
  Administered 2021-08-05: 25 ug via INTRAVENOUS

## 2021-08-05 MED ORDER — FENTANYL 2500MCG IN NS 250ML (10MCG/ML) PREMIX INFUSION
25.0000 ug/h | INTRAVENOUS | Status: DC
  Administered 2021-08-05: 25 ug/h via INTRAVENOUS
  Administered 2021-08-06: 100 ug/h via INTRAVENOUS
  Filled 2021-08-05 (×2): qty 250

## 2021-08-05 MED ORDER — FUROSEMIDE 10 MG/ML IJ SOLN
40.0000 mg | Freq: Three times a day (TID) | INTRAMUSCULAR | Status: DC
Start: 2021-08-05 — End: 2021-08-05

## 2021-08-05 MED ORDER — FUROSEMIDE 10 MG/ML IJ SOLN
80.0000 mg | Freq: Once | INTRAMUSCULAR | Status: DC
  Administered 2021-08-05: 80 mg via INTRAVENOUS
  Filled 2021-08-05: qty 8

## 2021-08-05 MED ORDER — FUROSEMIDE 10 MG/ML IJ SOLN
40.0000 mg | Freq: Two times a day (BID) | INTRAMUSCULAR | Status: AC
  Administered 2021-08-05 – 2021-08-06 (×3): 40 mg via INTRAVENOUS
  Filled 2021-08-05 (×3): qty 4

## 2021-08-05 MED ORDER — PROPOFOL 1000 MG/100ML IV EMUL
5.0000 ug/kg/min | INTRAVENOUS | Status: DC
  Administered 2021-08-05: 30 ug/kg/min via INTRAVENOUS
  Administered 2021-08-06: 20 ug/kg/min via INTRAVENOUS
  Filled 2021-08-05 (×3): qty 100

## 2021-08-05 MED ORDER — DEXTROSE 10 % IV SOLN: INTRAVENOUS | Status: DC

## 2021-08-05 MED ORDER — SODIUM CHLORIDE 0.9% FLUSH: 10.0000 mL | INTRAVENOUS | Status: DC | PRN

## 2021-08-05 MED ORDER — PROPOFOL 10 MG/ML IV BOLUS
INTRAVENOUS | Status: AC
  Filled 2021-08-05: qty 20

## 2021-08-05 MED ORDER — FENTANYL BOLUS VIA INFUSION
25.0000 ug | INTRAVENOUS | Status: DC | PRN
  Administered 2021-08-05: 25 ug via INTRAVENOUS
  Administered 2021-08-05 – 2021-08-06 (×3): 50 ug via INTRAVENOUS
  Filled 2021-08-05: qty 100

## 2021-08-05 MED ORDER — POTASSIUM CHLORIDE 20 MEQ PO PACK
20.0000 meq | PACK | Freq: Two times a day (BID) | ORAL | Status: DC
Start: 2021-08-05 — End: 2021-08-06
  Administered 2021-08-05 – 2021-08-06 (×2): 20 meq
  Filled 2021-08-05 (×2): qty 1

## 2021-08-05 MED ORDER — ENOXAPARIN SODIUM 40 MG/0.4ML IJ SOSY
40.0000 mg | PREFILLED_SYRINGE | INTRAMUSCULAR | Status: DC
  Administered 2021-08-05: 40 mg via SUBCUTANEOUS
  Filled 2021-08-05 (×2): qty 0.4

## 2021-08-05 MED ORDER — CHLORHEXIDINE GLUCONATE CLOTH 2 % EX PADS
6.0000 | MEDICATED_PAD | Freq: Every day | CUTANEOUS | Status: DC
  Administered 2021-08-05 – 2021-08-08 (×5): 6 via TOPICAL

## 2021-08-05 MED ORDER — POLYETHYLENE GLYCOL 3350 17 G PO PACK
17.0000 g | PACK | Freq: Every day | ORAL | Status: DC | PRN
  Administered 2021-08-06: 17 g
  Filled 2021-08-05: qty 1

## 2021-08-05 MED ORDER — PANTOPRAZOLE SODIUM 40 MG IV SOLR
40.0000 mg | Freq: Every day | INTRAVENOUS | Status: DC
  Administered 2021-08-05: 40 mg via INTRAVENOUS
  Filled 2021-08-05: qty 40

## 2021-08-05 MED ORDER — PERFLUTREN LIPID MICROSPHERE
1.0000 mL | INTRAVENOUS | Status: AC | PRN
  Administered 2021-08-05: 8 mL via INTRAVENOUS
  Filled 2021-08-05: qty 10

## 2021-08-05 MED ORDER — DOCUSATE SODIUM 50 MG/5ML PO LIQD
100.0000 mg | Freq: Two times a day (BID) | ORAL | Status: DC | PRN
  Administered 2021-08-06: 100 mg
  Filled 2021-08-05: qty 10

## 2021-08-05 MED ORDER — ROCURONIUM BROMIDE 50 MG/5ML IV SOLN
INTRAVENOUS | Status: DC | PRN
  Administered 2021-08-05: 50 mg via INTRAVENOUS

## 2021-08-05 MED ORDER — ETOMIDATE 2 MG/ML IV SOLN
INTRAVENOUS | Status: DC | PRN
  Administered 2021-08-05: 30 mg via INTRAVENOUS

## 2021-08-05 MED ORDER — NITROGLYCERIN IN D5W 200-5 MCG/ML-% IV SOLN
0.0000 ug/min | INTRAVENOUS | Status: DC
  Administered 2021-08-05: 5 ug/min via INTRAVENOUS
  Filled 2021-08-05: qty 250

## 2021-08-05 MED ORDER — SODIUM CHLORIDE 0.9% FLUSH
10.0000 mL | Freq: Two times a day (BID) | INTRAVENOUS | Status: DC
  Administered 2021-08-05 – 2021-08-07 (×4): 10 mL

## 2021-08-05 MED ORDER — PROPOFOL 1000 MG/100ML IV EMUL
INTRAVENOUS | Status: AC
  Administered 2021-08-05: 40 ug/kg/min via INTRAVENOUS
  Filled 2021-08-05: qty 100

## 2021-08-05 MED ORDER — PROPOFOL 1000 MG/100ML IV EMUL
INTRAVENOUS | Status: DC | PRN
  Administered 2021-08-05: 20 ug/kg/min via INTRAVENOUS

## 2021-08-05 NOTE — ED Triage Notes (Addendum)
BIB EMS for SOB. Pt was diaphoretic and sob in the bus stop. Pt was 82% on room air. Placed on NRB and became 99% when she kept it on. Pt was telling EMS that she felt like she was gonna pass out.

## 2021-08-05 NOTE — Progress Notes (Signed)
eLink Physician-Brief Progress Note Patient Name: Deborah Ho DOB: October 27, 1951 MRN: [id number]   Date of Service  08/05/2021  HPI/Events of Note  Somewhat hypoglycemic  eICU Interventions  - added D10W at 30/hr - want to keep fluid to a minimal, will need to keep her net (-)  - also, added a f/u troponin     Intervention Category Intermediate Interventions: Other:  [name] 08/05/2021, 8:57 PM

## 2021-08-05 NOTE — Progress Notes (Signed)
  Echocardiogram 2D Echocardiogram has been performed.  Deborah Ho 08/05/2021, 3:46 PM

## 2021-08-05 NOTE — ED Provider Notes (Addendum)
[facility] Provider Note  CSN: [id number] Arrival date & time: 08/05/21 1053    History Chief Complaint  Patient presents with   Shortness of Breath    Deborah Ho is a 70 y.o. female with history of CAD, CHF with low EF, prior noncompliance, has not been seen in this system in about a year. [place], found in respiratory distress with reportedly low SpO2 by first responders, arrives on a NRB at 8L and unresponsive. Level 5 caveat applies.    Past Medical History:  Diagnosis Date   CAD (coronary artery disease) 05/03/14; 05/09/14   a. anterior STEMI with early in-stent thorombosis for missed dose of Brillinta s/p PCI with DESx 2 into LAD (04/2014)   CHF (congestive heart failure) (HCC)    GERD (gastroesophageal reflux disease)    Probable   HTN (hypertension)    Hyperlipidemia    Ischemic cardiomyopathy    a. 04/2014 ECHO with EF 45-50% b.  Repeat 2D echo 08/14/14 with EF down at 15%. Life vest placed   Non compliance w medication regimen    Tobacco use     Past Surgical History:  Procedure Laterality Date   CHOLECYSTECTOMY     CORONARY ANGIOPLASTY WITH STENT PLACEMENT  05/03/14   STEMI- stent to LAD DES- Xience alpine   CORONARY ANGIOPLASTY WITH STENT PLACEMENT  05/09/14   STEMI- overlapping stent to LAD, pt had missed a dose of Brilinta   LEFT HEART CATHETERIZATION WITH CORONARY ANGIOGRAM N/A 05/03/2014   Procedure: LEFT HEART CATHETERIZATION WITH CORONARY ANGIOGRAM;  Surgeon: Peter M Swazil[place], MD;  Location: [facility] CATH LAB;  Service: Cardiovascular;  Laterality: N/A;   LEFT HEART CATHETERIZATION WITH CORONARY ANGIOGRAM N/A 05/09/2014   Procedure: LEFT HEART CATHETERIZATION WITH CORONARY ANGIOGRAM;  Surgeon: Corky Crafts, MD;  Location: [facility] CATH LAB;  Service: Cardiovascular;  Laterality: N/A;   PARTIAL HYSTERECTOMY     PERCUTANEOUS STENT INTERVENTION  05/03/2014   Procedure: PERCUTANEOUS STENT INTERVENTION;  Surgeon: Peter M  [place]il[place], MD;  Location: [facility] CATH LAB;  Service: Cardiovascular;;  DES Prox LAD     Family History  Problem Relation Age of Onset   Diabetes Mother    Hypertension Mother     Social History   Tobacco Use   Smoking status: Former    Packs/day: 0.10    Years: 0.50    Pack years: 0.05    Types: Cigarettes   Smokeless tobacco: Former   Tobacco comments:    down to 3 cigarettes daily (08/03/14)  Vaping Use   Vaping Use: Never used  Substance Use Topics   Alcohol use: No   Drug use: No     Home Medications Prior to Admission medications   Medication Sig Start Date End Date Taking? Authorizing Provider  atorvastatin (LIPITOR) 80 MG tablet Take 1 tablet by mouth once daily 06/26/21   [place], Peter M, MD  carboxymethylcellulose (REFRESH PLUS) 0.5 % SOLN Place 1 drop into both eyes 3 (three) times daily as needed (dry eyes). Patient not taking: Reported on 04/03/2021    [provider]  carvedilol (COREG) 3.125 MG tablet Take 1 tablet (3.125 mg total) by mouth 2 (two) times daily with a meal. Patient not taking: Reported on 04/03/2021 08/29/20   [place], Peter M, MD  cetirizine (ZYRTEC) 10 MG tablet Take 10 mg daily by mouth.     [provider]  cholecalciferol (VITAMIN D) 1000 units tablet Take 1,000 Units by  mouth every 14 (fourteen) days. Twice a month    [provider]  ferrous sulfate 325 (65 FE) MG tablet Take 1 tablet (325 mg total) by mouth 3 (three) times a week. 10/23/19 08/29/20  Swayze, Ava, DO  fluticasone (FLONASE) 50 MCG/ACT nasal spray Place 1 spray into both nostrils daily as needed for allergies (congestion).  Patient not taking: Reported on 04/03/2021 04/04/18   [provider]  isosorbide mononitrate (IMDUR) 30 MG 24 hr tablet Take 2 tablets (60 mg total) by mouth daily. 08/29/20   [place], Peter M, MD  magnesium oxide (MAG-OX) 400 (241.3 Mg) MG tablet Take 400 mg by mouth every 14 (fourteen) days. Twice a month    [provider]  nitroGLYCERIN (NITROSTAT) 0.4 MG SL tablet Place 1 tablet (0.4 mg total) under the tongue every 5 (five) minutes as needed for chest pain. 08/29/20   [place], Peter M, MD  potassium chloride SA (KLOR-CON) 20 MEQ tablet Take 1 tablet by mouth once daily 05/08/21   [place], Peter M, MD  spironolactone (ALDACTONE) 25 MG tablet Take 1 tablet (25 mg total) by mouth daily. Patient not taking: Reported on 04/03/2021 08/29/20   [place], Peter M, MD  ticagrelor (BRILINTA) 60 MG TABS tablet Take 1 tablet (60 mg total) by mouth 2 (two) times daily. Patient not taking: Reported on 04/03/2021 08/29/20   [place], Peter M, MD  torsemide (DEMADEX) 20 MG tablet TAKE 2 TABLETS BY MOUTH ONCE DAILY TAKE  2  TABLETS  TWICE  DAILY  FOR  3 DAYS  THEN  BACK  TO  2  TABLETS  DAILY 05/08/21   [place], Peter M, MD     Allergies    Aspirin, Effient [prasugrel], Entresto [sacubitril-valsartan], Lactose intolerance (gi), Robitussin dm [guaifenesin-dm], Sulfa antibiotics, and Other   Review of Systems   Review of Systems Unable to assess due to mental status.    Physical Exam BP 107/68   Pulse 82   Temp 98.1 F (36.7 C)   Resp (!) 26   Ht 5\' 5"  (1.651 m)   SpO2 100%   BMI 35.58 kg/m   Physical Exam Vitals and nursing note reviewed.  Constitutional:      Appearance: Normal appearance. She is diaphoretic.     Comments: unresponsive  HENT:     Head: Normocephalic and atraumatic.     Nose: Nose normal.     Mouth/Throat:     Mouth: Mucous membranes are moist.  Eyes:     Extraocular Movements: Extraocular movements intact.     Conjunctiva/sclera: Conjunctivae normal.  Cardiovascular:     Rate and Rhythm: Tachycardia present.  Pulmonary:     Effort: Accessory muscle usage and respiratory distress present.     Breath sounds: Decreased breath sounds present.  Abdominal:     General: Abdomen is flat.     Palpations: Abdomen is soft.     Tenderness: There is no abdominal tenderness.  Musculoskeletal:         General: No swelling. Normal range of motion.     Cervical back: Neck supple.  Skin:    General: Skin is warm.  Neurological:     Comments: Unresponsive to verbal or painful stimuli     ED Results / Procedures / Treatments   Labs (all labs ordered are listed, but only abnormal results are displayed) Labs Reviewed  RESP PANEL BY RT-PCR (FLU A&B, COVID) ARPGX2 - Abnormal; Notable for the following components:      Result  Value   SARS Coronavirus 2 by RT PCR POSITIVE (*)    All other components within normal limits  COMPREHENSIVE METABOLIC PANEL - Abnormal; Notable for the following components:   CO2 16 (*)    Glucose, Bld 234 (*)    Creatinine, Ser 1.59 (*)    Albumin 3.4 (*)    Alkaline Phosphatase 171 (*)    GFR, Estimated 35 (*)    Anion gap 16 (*)    All other components within normal limits  CBC WITH DIFFERENTIAL/PLATELET - Abnormal; Notable for the following components:   WBC 14.7 (*)    HCT 47.8 (*)    MCHC 29.9 (*)    Neutro Abs 9.7 (*)    Eosinophils Absolute 0.6 (*)    Abs Immature Granulocytes 0.25 (*)    All other components within normal limits  BRAIN NATRIURETIC PEPTIDE - Abnormal; Notable for the following components:   B Natriuretic Peptide 680.9 (*)    All other components within normal limits  LACTIC ACID, PLASMA - Abnormal; Notable for the following components:   Lactic Acid, Venous 3.2 (*)    All other components within normal limits  I-STAT ARTERIAL BLOOD GAS, ED - Abnormal; Notable for the following components:   pH, Arterial 7.149 (*)    pCO2 arterial 63.2 (*)    pO2, Arterial 223 (*)    Acid-base deficit 8.0 (*)    All other components within normal limits  I-STAT ARTERIAL BLOOD GAS, ED - Abnormal; Notable for the following components:   pH, Arterial 7.315 (*)    pO2, Arterial 78 (*)    Acid-base deficit 4.0 (*)    All other components within normal limits  CULTURE, BLOOD (ROUTINE X 2)  CULTURE, BLOOD (ROUTINE X 2)  PROTIME-INR  LACTIC ACID,  PLASMA  HIV ANTIBODY (ROUTINE TESTING W REFLEX)  TROPONIN I (HIGH SENSITIVITY)  TROPONIN I (HIGH SENSITIVITY)    EKG EKG Interpretation  Date/Time:  Tuesday August 05 2021 10:59:24 EDT Ventricular Rate:  110 PR Interval:    QRS Duration: 97 QT Interval:  324 QTC Calculation: 439 R Axis:   -49 Text Interpretation: Sinus rhythm Multiple ventricular premature complexes Inferior infarct, old Probable anteroseptal infarct, recent Since last tracing QRS voltage is increase PVCs now present Confirmed by Susy Frizzle [phone]) on 08/05/2021 11:17:00 AM  Radiology DG Chest Port 1 View  Result Date: 08/05/2021 CLINICAL DATA:  Central line placement EXAM: PORTABLE CHEST 1 VIEW COMPARISON:  08/05/2021 FINDINGS: Endotracheal tube tip is 3 cm above the carina. Orogastric or nasogastric tube enters the abdomen. New right internal jugular central line tip in the SVC just above the right atrium. No pneumothorax. Interstitial pulmonary edema persists. IMPRESSION: New right internal jugular central line tip at the SVC just above the right atrium. No pneumothorax. Persistent interstitial edema. Electronically Signed   By: Paulina Fusi M.D.   On: 08/05/2021 13:56   DG Chest Port 1 View  Result Date: 08/05/2021 CLINICAL DATA:  Shortness of breath.  Intubation EXAM: PORTABLE CHEST 1 VIEW COMPARISON:  10/11/2019 FINDINGS: Endotracheal tube terminates approximately 2.4 cm above the carina. Enteric tube courses below the diaphragm with distal tip beyond the inferior margin of the film. Mild cardiomegaly. Atherosclerotic calcification of the aortic knob. Focal opacity in the right hilar region. Diffusely increased interstitial markings throughout both lungs with subtle interstitial opacities in the periphery of the left mid lung. No pleural effusion or pneumothorax identified. IMPRESSION: 1. Lines and tubes as described above. 2. Focal  opacity in the right hilar region. This could represent pneumonia, adenopathy,  or neoplasm. Recommend further evaluation with contrast-enhanced CT of the chest. 3. Diffusely increased interstitial markings throughout both lungs with subtle interstitial opacities in the periphery of the left mid lung, may reflect pulmonary edema or atypical/viral infection. Electronically Signed   By: Nicholas  Plundo D.O.   On: 08/05/2021 11:53    Procedures .Critical Care Performed by: Araly Kaas B, MD Authorized by: Isiaah Cuervo B, MD   Critical care provider statement:    Critical care time (minutes):  45   Critical care time was exclusive of:  Separately billable procedures and treating other patients   Critical care was necessary to treat or prevent imminent or life-threatening deterioration of the following conditions:  Respiratory failure   Critical care was time spent personally by me on the following activities:  Discussions with consultants, evaluation of patient's response to treatment, examination of patient, ordering and performing treatments and interventions, ordering and review of laboratory studies, ordering and review of radiographic studies, pulse oximetry, re-evaluation of patient's condition, obtaining history from patient or surrogate and review of old charts   Care discussed with: admitting provider   Procedure Name: Intubation Date/Time: 08/05/2021 11:15 AM Performed by: Anastacio Bua B, MD Pre-anesthesia Checklist: Patient identified, Patient being monitored, Emergency Drugs available and Suction available Oxygen Delivery Method: Ambu bag Preoxygenation: Pre-oxygenation with 100% oxygen Induction Type: Rapid sequence Ventilation: Mask ventilation without difficulty Laryngoscope Size: Glidescope and 3 Grade View: Grade I Tube size: 8.0 mm Number of attempts: 1 Placement Confirmation: ETT inserted through vocal cords under direct vision, CO2 detector and Breath sounds checked- equal and bilateral Secured at: 22 cm Tube secured with: ETT  holder    .Central Line  Date/Time: 08/05/2021 1:21 PM Performed by: Camil Wilhelmsen B, MD Authorized by: Halbert Jesson B, MD   Consent:    Consent obtained:  Emergent situation Pre-procedure details:    Indication(s): insufficient peripheral access     Hand hygiene: Hand hygiene performed prior to insertion     Sterile barrier technique: All elements of maximal sterile technique followed     Skin preparation:  Chlorhexidine   Skin preparation agent: Skin preparation agent completely dried prior to procedure   Sedation:    Sedation type:  None Anesthesia:    Anesthesia method:  None Procedure details:    Location:  R internal jugular   Patient position:  Supine   Procedural supplies:  Triple lumen   Catheter size:  7 Fr   Landmarks identified: yes     Ultrasound guidance: yes     Ultrasound guidance timing: prior to insertion and real time     Sterile ultrasound techniques: Sterile gel and sterile probe covers were used     Number of attempts:  2   Successful placement: yes   Post-procedure details:    Post-procedure:  Dressing applied and line sutured   Assessment:  Blood return through all ports and free fluid flow   Procedure completion:  Tolerated well, no immediate complications  Medications Ordered in the ED Medications  rocuronium (ZEMCasimiro Casimiro Despina Arias inje<[id number] Comprehensive <MEASUREME ntaNYL (SUBLIMAZE) bolus via infusion 25-100 mcg (has no administration in time range)  furosemide (LASIX)  injection 80 mg (has no administration in time range)  docusate (COLACE) 50 MG/5ML liquid 100 mg (has no administration in time range)  polyethylene glycol (MIRALAX /  GLYCOLAX) packet 17 g (has no administration in time range)  enoxaparin (LOVENOX) injection 40 mg (has no administration in time range)  ondansetron (ZOFRAN) injection 4 mg (has no administration in time range)  pantoprazole (PROTONIX) injection 40 mg (has no administration in time range)  fentaNYL (SUBLIMAZE) injection 25 mcg (25 mcg Intravenous Given 08/05/21 1237)     MDM Rules/Calculators/A&P MDM  Patient arrives in respiratory distress unresponsive. Consider CHF, COPD, PNA, Covid, PE. Not a candidate for BiPAP give her mental status and so intubated on arrival. Will check labs, CXR, EKG. Propofol drip for sedation.   ED Course  I have reviewed the triage vital signs and the nursing notes.  Pertinent labs & imaging results that were available during my care of the patient were reviewed by me and considered in my medical decision making (see chart for details).  Clinical Course as of 08/10/21 1831  Tue Aug 05, 2021  1119 CXR reviewed, tubes look to be in satisfactory position, likely pulm edema. BP is elevated. Will begin NTG drip.  [CS]  1153 ABG shows acute respiratory acidosis. Will recheck after being on the vent.  [CS]  1207 Official CXR results reviewed, concern for pna vs neoplasm. Will plan CT imaging when labs have resulted.  [CS]  1222 CBC shows mild leukocytosis.  [CS]  1241 Temp foley placed, she is not febrile. She has poor peripheral access, will place central line.  [CS]  1323 CMP with CKD, improved from previous. Trop is neg. Coags neg.  [CS]  [id number] Spoke with Dr. Denese Killings, ICU who will come evaluate for admission.  [CS]  1415 Lactic acid is mildly elevated.  [CS]  1416 Repeat ABG is improved. Mild acidosis remains with normal pCO2.  [CS]  1441 Covid is positive.  [CS]    Clinical Course User Index [CS] Pollyann Savoy, MD    Final Clinical Impression(s) / ED Diagnoses Final diagnoses:  Shortness of breath  Acute respiratory failure with hypoxia and  hypercapnia (HCC)  COVID-19    Rx / DC Orders ED Discharge Orders     None        Pollyann Savoy, MD 08/05/21 1452    Pollyann Savoy, MD 08/10/21 [phone]

## 2021-08-05 NOTE — Consult Note (Addendum)
NAME:  Deborah Ho, MRN:  [id number], DOB:  30-Oct-1951, LOS: 0 ADMISSION DATE:  08/05/2021, CONSULTATION DATE:  08/05/21 REFERRING MD:  Bernette Mayers CHIEF COMPLAINT:  Resp Distress, AMS   History of Present Illness:  Deborah Ho is a 70 y.o. female who has a PMH as outlined below and who has hx of medication non-compliance who presented to [facility] ED 9/20 with dyspnea and diaphoresis. [place] and [place] symptomatic. SpO2 was 82% on room air.   While in ED, she became altered to the point she could not protect her airway.  She was subsequently intubated.  CXR demonstrated pulmonary edema with focal opacity in right hilar region concerning for PNA vs neoplasm.  CT chest ordered and pending. She was given 80mg  Lasix.  PCCM called for admission.  Per chart review, she was last seen by cardiology Oct 2021 and notes indicate she was fired from [facility] clinic due to non-compliance.  Pertinent  Medical History:  has HLD (hyperlipidemia); Tobacco abuse; Cardiomyopathy, ischemic; Elevated LFTs; History of ST elevation myocardial infarction (STEMI); Coronary atherosclerosis of native coronary artery; Ischemic cardiomyopathy; Angioedema of lips; Essential hypertension; Pleural effusion on right; Adjustment disorder with mixed anxiety and depressed mood; CKD (chronic kidney disease), stage III (HCC); Chronic systolic heart failure (HCC); Abdominal distension; Anasarca; Peripheral edema; Hypokalemia; Cellulitis of left lower leg; Intractable nausea and vomiting; Hyperlipidemia; GERD (gastroesophageal reflux disease); CHF (congestive heart failure) (HCC); Cellulitis of left lower extremity; Cough productive of clear sputum; Acute on chronic combined systolic and diastolic CHF (congestive heart failure) (HCC); Noncompliance with medication regimen; Acute on chronic systolic CHF (congestive heart failure) (HCC); Acute exacerbation of CHF (congestive heart failure) (HCC); Acute CHF (congestive  heart failure) (HCC); Abdominal pain; Vitamin D deficiency; Obesity with body mass index 30 or greater; Acute systolic CHF (congestive heart failure) (HCC); Acute on chronic diastolic CHF (congestive heart failure) (HCC); Palliative care by specialist; and Goals of care, counseling/discussion on their problem list.  Significant Hospital Events: Including procedures, antibiotic start and stop dates in addition to other pertinent events   9/20 > admit.  Interim History / Subjective:  Sedated on Propofol and Fentanyl.  Objective:  Blood pressure 107/68, pulse 82, temperature 98.1 F (36.7 C), resp. rate (!) 26, height 5\' 5"  (1.651 m), SpO2 100 %.    Vent Mode: PRVC FiO2 (%):  [50 %-100 %] 50 % Set Rate:  [18 bmp-26 bmp] 26 bmp Vt Set:  [450 mL] 450 mL PEEP:  [8 cmH20] 8 cmH20 Plateau Pressure:  [24 cmH20-30 cmH20] 24 cmH20  No intake or output data in the 24 hours ending 08/05/21 1416 There were no vitals filed for this visit.  Examination: General: Adult female, resting in bed, in NAD. Neuro: Sedated on vent, does not follow commands. HEENT: /AT. Sclerae anicteric. ETT in place. Cardiovascular: RRR, no M/R/G.  Lungs: Respirations even and unlabored.  Faint crackles. Abdomen: Obese. BS x 4, soft, NT/ND.  Musculoskeletal: No gross deformities, no edema.  Skin: Intact, warm, no rashes.  Labs/imaging personally reviewed:  CT chest 9/20 >   Resolved Hospital Problem list:    Assessment & Plan:   Acute hypoxic and hypercapnic respiratory failure - presumed 2/2 CHF / pulmonary edema + hypoventilation.  S/p intubation in ED.  Low suspicion for PNA. - Full vent support. - Bronchial hygiene. - Continue Lasix as BP and SCr permit, goal net neg balance. - Defer abx for now - Follow cultures. - Assess RVP, UDS. -  Follow CXR.  Presumed OSA/OHS. - Consider CPAP nocturnally once extubated followed by outpatient sleep study.  Hx HTN, HLD, combined CHF (echo from Jan 2020 with EF  15%, G3DD), CAD s/p PCI and DES 2015 (was on Brilinta but per [facility], apparently has not been taking). - Repeat echo now to assess for further decompensation, might need to curbside or discuss with advanced heart failure (she was fired from their practice previously due to non-compliance). - Continue Lasix as BP and SCr permit, goal net neg balance. - Continue prescribed Brilinta, need to stress importance to pt after extubation. - Hold home Atorvastatin, Carvedilol, Imdur, Nitro, Spironolactone, Torsemide.  AoCKD. - Follow I/O's, goal neg balance. - Follow BMP.   Best practice (evaluated daily):  Diet/type: NPO DVT prophylaxis: LMWH GI prophylaxis: PPI Lines: Central line Foley:  Yes, and it is still needed Code Status:  full code Last date of multidisciplinary goals of care discussion: None yet.  Labs   CBC: Recent Labs  Lab 08/05/21 1144 08/05/21 1149 08/05/21 1413  WBC 14.7*  --   --   NEUTROABS 9.7*  --   --   HGB 14.3 13.9 13.6  HCT 47.8* 41.0 40.0  MCV 98.6  --   --   PLT 321  --   --     Basic Metabolic Panel: Recent Labs  Lab 08/05/21 1144 08/05/21 1149 08/05/21 1413  NA 139 139 143  K 3.9 3.6 4.2  CL 107  --   --   CO2 16*  --   --   GLUCOSE 234*  --   --   BUN 17  --   --   CREATININE 1.59*  --   --   CALCIUM 8.9  --   --    GFR: CrCl cannot be calculated (Unknown ideal weight.). Recent Labs  Lab 08/05/21 1130 08/05/21 1144  WBC  --  14.7*  LATICACIDVEN 3.2*  --     Liver Function Tests: Recent Labs  Lab 08/05/21 1144  AST 24  ALT 18  ALKPHOS 171*  BILITOT 0.7  PROT 7.0  ALBUMIN 3.4*   No results for input(s): LIPASE, AMYLASE in the last 168 hours. No results for input(s): AMMONIA in the last 168 hours.  ABG    Component Value Date/Time   PHART 7.315 (L) 08/05/2021 1413   PCO2ART 44.0 08/05/2021 1413   PO2ART 78 (L) 08/05/2021 1413   HCO3 22.4 08/05/2021 1413   TCO2 24 08/05/2021 1413   ACIDBASEDEF 4.0 (H) 08/05/2021 1413    O2SAT 94.0 08/05/2021 1413     Coagulation Profile: Recent Labs  Lab 08/05/21 1130  INR 1.0    Cardiac Enzymes: No results for input(s): CKTOTAL, CKMB, CKMBINDEX, TROPONINI in the last 168 hours.  HbA1C: Hgb A1c MFr Bld  Date/Time Value Ref Range Status  09/29/2017 04:15 AM 6.2 (H) 4.8 - 5.6 % Final    Comment:    (NOTE) Pre diabetes:          5.7%-6.4% Diabetes:              >6.4% Glycemic control for   <7.0% adults with diabetes   02/19/2017 04:50 AM 6.2 (H) 4.8 - 5.6 % Final    Comment:    (NOTE)         Pre-diabetes: 5.7 - 6.4         Diabetes: >6.4         Glycemic control for adults with diabetes: <7.0  CBG: No results for input(s): GLUCAP in the last 168 hours.  Review of Systems:   Unable to obtain as pt is encephalopathic.  Past Medical History:  She,  has a past medical history of CAD (coronary artery disease) (05/03/14; 05/09/14), CHF (congestive heart failure) (HCC), GERD (gastroesophageal reflux disease), HTN (hypertension), Hyperlipidemia, Ischemic cardiomyopathy, Non compliance w medication regimen, and Tobacco use.   Surgical History:   Past Surgical History:  Procedure Laterality Date   CHOLECYSTECTOMY     CORONARY ANGIOPLASTY WITH STENT PLACEMENT  05/03/14   STEMI- stent to LAD DES- [facility]   CORONARY ANGIOPLASTY WITH STENT PLACEMENT  05/09/14   STEMI- overlapping stent to LAD, pt had missed a dose of Brilinta   LEFT HEART CATHETERIZATION WITH CORONARY ANGIOGRAM N/A 05/03/2014   Procedure: LEFT HEART CATHETERIZATION WITH CORONARY ANGIOGRAM;  Surgeon: Peter M Sw[place]and, MD;  Location: [facility] CATH LAB;  Service: Cardiovascular;  Laterality: N/A;   LEFT HEART CATHETERIZATION WITH CORONARY ANGIOGRAM N/A 05/09/2014   Procedure: LEFT HEART CATHETERIZATION WITH CORONARY ANGIOGRAM;  Surgeon: Corky Crafts, MD;  Location: [facility] CATH LAB;  Service: Cardiovascular;  Laterality: N/A;   PARTIAL HYSTERECTOMY     PERCUTANEOUS STENT INTERVENTION  05/03/2014    Procedure: PERCUTANEOUS STENT INTERVENTION;  Surgeon: Peter M [place], MD;  Location: [facility] CATH LAB;  Service: Cardiovascular;;  DES Prox LAD      Social History:   reports that she has quit smoking. Her smoking use included cigarettes. She has a 0.05 pack-year smoking history. She has quit using smokeless tobacco. She reports that she does not drink alcohol and does not use drugs.   Family History:  Her family history includes Diabetes in her mother; Hypertension in her mother.   Allergies Allergies  Allergen Reactions   Aspirin Swelling    Chewable children's aspirin makes patients tongue and face swell   Effient [Prasugrel] Swelling    Patient's tongue and face swells   Entresto [Sacubitril-Valsartan] Other (See Comments)    dizziness   Lactose Intolerance (Gi) Other (See Comments)    REACTION: stomach upset   Robitussin Dm [Guaifenesin-Dm] Swelling    Patient's tongue swells   Sulfa Antibiotics Swelling   Other     Had to replace "catgut" with clamps     Home Medications  Prior to Admission medications   Medication Sig Start Date End Date Taking? Authorizing Provider  atorvastatin (LIPITOR) 80 MG tablet Take 1 tablet by mouth once daily 06/26/21   [place], Peter M, MD  carboxymethylcellulose (REFRESH PLUS) 0.5 % SOLN Place 1 drop into both eyes 3 (three) times daily as needed (dry eyes). Patient not taking: Reported on 04/03/2021    [provider]  carvedilol (COREG) 3.125 MG tablet Take 1 tablet (3.125 mg total) by mouth 2 (two) times daily with a meal. Patient not taking: Reported on 04/03/2021 08/29/20   [place], Peter M, MD  cetirizine (ZYRTEC) 10 MG tablet Take 10 mg daily by mouth.     [provider]  cholecalciferol (VITAMIN D) 1000 units tablet Take 1,000 Units by mouth every 14 (fourteen) days. Twice a month    [provider]  ferrous sulfate 325 (65 FE) MG tablet Take 1 tablet (325 mg total) by mouth 3 (three) times a week. 10/23/19 08/29/20   Swayze, Ava, DO  fluticasone (FLONASE) 50 MCG/ACT nasal spray Place 1 spray into both nostrils daily as needed for allergies (congestion).  Patient not taking: Reported on 04/03/2021 04/04/18   [provider]  isosorbide mononitrate (IMDUR) 30 MG 24 hr tablet Take 2 tablets (60 mg total) by mouth daily. 08/29/20   [place], Peter M, MD  magnesium oxide (MAG-OX) 400 (241.3 Mg) MG tablet Take 400 mg by mouth every 14 (fourteen) days. Twice a month    [provider]  nitroGLYCERIN (NITROSTAT) 0.4 MG SL tablet Place 1 tablet (0.4 mg total) under the tongue every 5 (five) minutes as needed for chest pain. 08/29/20   [place], Peter M, MD  potassium chloride SA (KLOR-CON) 20 MEQ tablet Take 1 tablet by mouth once daily 05/08/21   [place], Peter M, MD  spironolactone (ALDACTONE) 25 MG tablet Take 1 tablet (25 mg total) by mouth daily. Patient not taking: Reported on 04/03/2021 08/29/20   [place], Peter M, MD  ticagrelor (BRILINTA) 60 MG TABS tablet Take 1 tablet (60 mg total) by mouth 2 (two) times daily. Patient not taking: Reported on 04/03/2021 08/29/20   [place], Peter M, MD  torsemide (DEMADEX) 20 MG tablet TAKE 2 TABLETS BY MOUTH ONCE DAILY TAKE  2  TABLETS  TWICE  DAILY  FOR  3 DAYS  THEN  BACK  TO  2  TABLETS  DAILY 05/08/21   [place], Peter M, MD     Critical care time: 35 min.   Rutherford Guys, PA - [facility] For pager details, please see AMION or use Epic chat  After 1900, please call [facility] for cross coverage needs 08/05/2021, 2:16 PM

## 2021-08-05 NOTE — Progress Notes (Signed)
Heart Failure Navigator Progress Note  Assessed for Heart & Vascular TOC clinic readiness.  Patient does not meet criteria due to prior patient of AHF clinic, not accepted back d/t non-compliance. Pt admitted for acute resp failure probable related to medication non-compliance.  Navigator available for reassessment of patient once stabilized.   Ozella Rocks, MSN, RN Heart Failure Nurse Navigator [phone]

## 2021-08-05 NOTE — ED Notes (Signed)
Pt transported to CT then to ICU by RT and ICU RN. Nurse updated regarding late administration of 80mg  lasik.

## 2021-08-05 NOTE — Progress Notes (Signed)
eLink Physician-Brief Progress Note Patient Name: Deborah Ho DOB: [birth year] MRN: [id number]   Date of Service  08/05/2021  HPI/Events of Note  MAP below 65; sedative dose lowered; MAP then came up to above 65  eICU Interventions  Checking CVP     Intervention Category Intermediate Interventions: Hypotension - evaluation and management  [name] 08/05/2021, 11:18 PM

## 2021-08-06 ENCOUNTER — Inpatient Hospital Stay (HOSPITAL_COMMUNITY): Payer: Medicare PPO

## 2021-08-06 DIAGNOSIS — J9601 Acute respiratory failure with hypoxia: Secondary | ICD-10-CM | POA: Diagnosis not present

## 2021-08-06 DIAGNOSIS — U071 COVID-19: Secondary | ICD-10-CM

## 2021-08-06 DIAGNOSIS — I5043 Acute on chronic combined systolic (congestive) and diastolic (congestive) heart failure: Secondary | ICD-10-CM | POA: Diagnosis not present

## 2021-08-06 LAB — BASIC METABOLIC PANEL
Anion gap: 9 (ref 5–15)
BUN: 17 mg/dL (ref 8–23)
CO2: 24 mmol/L (ref 22–32)
Calcium: 8.8 mg/dL — ABNORMAL LOW (ref 8.9–10.3)
Chloride: 106 mmol/L (ref 98–111)
Creatinine, Ser: 1.47 mg/dL — ABNORMAL HIGH (ref 0.44–1.00)
GFR, Estimated: 38 mL/min — ABNORMAL LOW (ref >60)
Glucose, Bld: 109 mg/dL — ABNORMAL HIGH (ref 70–99)
Potassium: 3.6 mmol/L (ref 3.5–5.1)
Sodium: 139 mmol/L (ref 135–145)

## 2021-08-06 LAB — CBC
HCT: 37.2 % (ref 36.0–46.0)
Hemoglobin: 11.8 g/dL — ABNORMAL LOW (ref 12.0–15.0)
MCH: 29.1 pg (ref 26.0–34.0)
MCHC: 31.7 g/dL (ref 30.0–36.0)
MCV: 91.9 fL (ref 80.0–100.0)
Platelets: 219 10*3/uL (ref 150–400)
RBC: 4.05 MIL/uL (ref 3.87–5.11)
RDW: 12.8 % (ref 11.5–15.5)
WBC: 11.5 10*3/uL — ABNORMAL HIGH (ref 4.0–10.5)
nRBC: 0 % (ref 0.0–0.2)

## 2021-08-06 LAB — RESPIRATORY PANEL BY PCR

## 2021-08-06 LAB — MAGNESIUM: Magnesium: 2 mg/dL (ref 1.7–2.4)

## 2021-08-06 LAB — PHOSPHORUS: Phosphorus: 2.6 mg/dL (ref 2.5–4.6)

## 2021-08-06 LAB — GLUCOSE, CAPILLARY
Glucose-Capillary: 93 mg/dL (ref 70–99)
Glucose-Capillary: 107 mg/dL — ABNORMAL HIGH (ref 70–99)
Glucose-Capillary: 116 mg/dL — ABNORMAL HIGH (ref 70–99)
Glucose-Capillary: 137 mg/dL — ABNORMAL HIGH (ref 70–99)

## 2021-08-06 LAB — LACTIC ACID, PLASMA: Lactic Acid, Venous: 1.3 mmol/L (ref 0.5–1.9)

## 2021-08-06 LAB — C-REACTIVE PROTEIN: CRP: 5.2 mg/dL — ABNORMAL HIGH (ref <1.0)

## 2021-08-06 LAB — TRIGLYCERIDES: Triglycerides: 121 mg/dL (ref <150)

## 2021-08-06 MED ORDER — INSULIN ASPART 100 UNIT/ML IJ SOLN
0.0000 [IU] | Freq: Three times a day (TID) | INTRAMUSCULAR | Status: DC
Start: 2021-08-07 — End: 2021-08-07

## 2021-08-06 MED ORDER — DEXAMETHASONE 4 MG PO TABS
6.0000 mg | ORAL_TABLET | Freq: Every day | ORAL | Status: DC
  Administered 2021-08-06 – 2021-08-09 (×4): 6 mg via ORAL
  Filled 2021-08-06 (×4): qty 2

## 2021-08-06 MED ORDER — ATORVASTATIN CALCIUM 80 MG PO TABS
80.0000 mg | ORAL_TABLET | Freq: Every day | ORAL | Status: DC
  Administered 2021-08-06 – 2021-08-08 (×3): 80 mg via ORAL
  Filled 2021-08-06 (×3): qty 1

## 2021-08-06 MED ORDER — CHLORHEXIDINE GLUCONATE 0.12% ORAL RINSE (MEDLINE KIT)
15.0000 mL | Freq: Two times a day (BID) | OROMUCOSAL | Status: DC
  Administered 2021-08-06: 15 mL via OROMUCOSAL

## 2021-08-06 MED ORDER — ORAL CARE MOUTH RINSE: 15.0000 mL | OROMUCOSAL | Status: DC

## 2021-08-06 MED ORDER — VITAMIN D 25 MCG (1000 UNIT) PO TABS
1000.0000 [IU] | ORAL_TABLET | ORAL | Status: DC
  Administered 2021-08-06: 1000 [IU] via ORAL
  Filled 2021-08-06: qty 1

## 2021-08-06 MED ORDER — INSULIN ASPART 100 UNIT/ML IJ SOLN
0.0000 [IU] | Freq: Every day | INTRAMUSCULAR | Status: DC
Start: 2021-08-06 — End: 2021-08-07

## 2021-08-06 MED ORDER — ISOSORB DINITRATE-HYDRALAZINE 20-37.5 MG PO TABS
1.0000 | ORAL_TABLET | Freq: Three times a day (TID) | ORAL | Status: DC
  Administered 2021-08-06 – 2021-08-08 (×6): 1 via ORAL
  Filled 2021-08-06 (×6): qty 1

## 2021-08-06 MED ORDER — METHYLPREDNISOLONE SODIUM SUCC 125 MG IJ SOLR
125.0000 mg | INTRAMUSCULAR | Status: DC
Start: 2021-08-06 — End: 2021-08-06

## 2021-08-06 MED ORDER — POTASSIUM CHLORIDE CRYS ER 20 MEQ PO TBCR
20.0000 meq | EXTENDED_RELEASE_TABLET | Freq: Two times a day (BID) | ORAL | Status: DC
  Administered 2021-08-06 – 2021-08-07 (×3): 20 meq via ORAL
  Filled 2021-08-06 (×3): qty 1

## 2021-08-06 MED ORDER — CARVEDILOL 6.25 MG PO TABS
6.2500 mg | ORAL_TABLET | Freq: Two times a day (BID) | ORAL | Status: DC
  Administered 2021-08-06 – 2021-08-08 (×4): 6.25 mg via ORAL
  Filled 2021-08-06 (×4): qty 1

## 2021-08-06 MED ORDER — TORSEMIDE 20 MG PO TABS
40.0000 mg | ORAL_TABLET | Freq: Every day | ORAL | Status: DC
  Administered 2021-08-07: 40 mg via ORAL
  Filled 2021-08-06 (×2): qty 2

## 2021-08-06 MED ORDER — ISOSORBIDE MONONITRATE ER 30 MG PO TB24: 60.0000 mg | ORAL_TABLET | Freq: Every day | ORAL | Status: DC

## 2021-08-06 MED ORDER — ACETAMINOPHEN 325 MG PO TABS
650.0000 mg | ORAL_TABLET | Freq: Four times a day (QID) | ORAL | Status: DC | PRN
  Administered 2021-08-06 – 2021-08-08 (×3): 650 mg via ORAL
  Filled 2021-08-06 (×2): qty 2

## 2021-08-06 MED ORDER — NITROGLYCERIN 0.4 MG SL SUBL: 0.4000 mg | SUBLINGUAL_TABLET | SUBLINGUAL | Status: DC | PRN

## 2021-08-06 NOTE — Progress Notes (Signed)
Pt placed on PSV 8/8 per wean protocol. Pt is tolerating well at this time. RN aware. RT to continue to monitor. 

## 2021-08-06 NOTE — Progress Notes (Signed)
eLink Physician-Brief Progress Note Patient Name: Deborah Ho DOB: 1951/07/16 MRN: [id number]   Date of Service  08/06/2021  HPI/Events of Note  Patient c/o throat pain. Allergy to Chloraseptic Spray noted by Epic. AST and ALT are normal.   eICU Interventions  Plan: Tylenol 650 mg PO Q 6 hours PRN pain or headache.      Intervention Category Major Interventions: Other:  Lenell Antu 08/06/2021, 7:50 PM

## 2021-08-06 NOTE — Procedures (Signed)
Extubation Procedure Note  Patient Details:   Name: Deborah Ho DOB: 22-Nov-1950 MRN: [id number]   Airway Documentation:    Vent end date: 08/06/21 Vent end time: 1123   Evaluation  O2 sats: stable throughout Complications: No apparent complications Patient did tolerate procedure well. Bilateral Breath Sounds: Diminished   Pt extubated to 4L [place] per MD order. Pt did not have cuff leak prior to extubation. MD notified and gave orders to extubate any ways. Pt able to voice her name. No stridor noted.  Guss Bunde 08/06/2021, 11:23 AM

## 2021-08-06 NOTE — Progress Notes (Addendum)
NAME:  Deborah Ho, MRN:  [id number], DOB:  [birth year], LOS: 1 ADMISSION DATE:  08/05/2021, CONSULTATION DATE:  08/05/21 REFERRING MD:  Bernette Mayers CHIEF COMPLAINT:  Resp Distress, AMS   History of Present Illness:  Deborah Ho is a 70 y.o. female who has a PMH as outlined below and who has hx of medication non-compliance who presented to [facility] ED 9/20 with dyspnea and diaphoresis. [place] and [place] symptomatic. SpO2 was 82% on room air.   While in ED, she became altered to the point she could not protect her airway.  She was subsequently intubated.  CXR demonstrated pulmonary edema with focal opacity in right hilar region concerning for PNA vs neoplasm.  CT chest ordered and pending. She was given 80mg  Lasix.  PCCM called for admission.  Per chart review, she was last seen by cardiology Oct 2021 and notes indicate she was fired from [facility] clinic due to non-compliance.  She remains critically ill  Pertinent  Medical History:  has HLD (hyperlipidemia); Tobacco abuse; Cardiomyopathy, ischemic; Elevated LFTs; History of ST elevation myocardial infarction (STEMI); Coronary atherosclerosis of native coronary artery; Ischemic cardiomyopathy; Angioedema of lips; Essential hypertension; Pleural effusion on right; Adjustment disorder with mixed anxiety and depressed mood; CKD (chronic kidney disease), stage III (HCC); Chronic systolic heart failure (HCC); Abdominal distension; Anasarca; Peripheral edema; Hypokalemia; Cellulitis of left lower leg; Intractable nausea and vomiting; Hyperlipidemia; GERD (gastroesophageal reflux disease); CHF (congestive heart failure) (HCC); Cellulitis of left lower extremity; Cough productive of clear sputum; Acute on chronic combined systolic and diastolic CHF (congestive heart failure) (HCC); Noncompliance with medication regimen; Acute on chronic systolic CHF (congestive heart failure) (HCC); Acute exacerbation of CHF (congestive heart  failure) (HCC); Acute CHF (congestive heart failure) (HCC); Abdominal pain; Vitamin D deficiency; Obesity with body mass index 30 or greater; Acute systolic CHF (congestive heart failure) (HCC); Acute on chronic diastolic CHF (congestive heart failure) (HCC); Palliative care by specialist; Goals of care, counseling/discussion; and Acute respiratory failure with hypoxia (HCC) on their problem list.  Significant Hospital Events: Including procedures, antibiotic start and stop dates in addition to other pertinent events   9/20 > admit.  Interim History / Subjective:  Tmax 99.1  Some hypoglycemia overnight, d10 gtt started  -1.2 L admit  Sedated/Intubated  100 fent, 20 prop  Unable to obtain subjective evaluation due to patient status   Objective:  Blood pressure 97/81, pulse 80, temperature 99.1 F (37.3 C), resp. rate (!) 26, height 5\' 5"  (1.651 m), weight 109.1 kg, SpO2 96 %. CVP:  [11 mmHg-19 mmHg] 12 mmHg  Vent Mode: PRVC FiO2 (%):  [40 %-100 %] 40 % Set Rate:  [18 bmp-26 bmp] 26 bmp Vt Set:  [450 mL] 450 mL PEEP:  [8 cmH20] 8 cmH20 Plateau Pressure:  [19 cmH20-30 cmH20] 23 cmH20   Intake/Output Summary (Last 24 hours) at 08/06/2021 0821 Last data filed at 08/06/2021 0700 Gross per 24 hour  Intake 808.16 ml  Output 2050 ml  Net -1241.84 ml   Filed Weights   08/05/21 1622 08/06/21 0329  Weight: 96.6 kg 109.1 kg    Examination: General:  in bed, NAD, appears comfortable HEENT: MM pink/moist, anicteric, atraumatic Neuro: GCS 11t, RASS 0, PERRL 87mm CV: S1S2, NSR, no m/r/g appreciated PULM:   air movement in all lobes, Trachea midline, chest expansion symmetric GI: soft, bsx4 active, non tender Extremities: warm/dry, generalized edema, capillary refill less than 3 seconds  Skin: no rashes or lesions noted  Labs/imaging personally reviewed:  CT chest 9/20 > Small BL pleural effusions, atelectasis vs pneumonia  CXR> Increased opacity in lower lobes, stable tubes and  lines.  Resolved Hospital Problem list:    Assessment & Plan:   COVID-19 infection Acute hypoxic and hypercapnic respiratory failure  ?admit for combo CHF and COVID pneumonia VS CHF exacerbation with incidental covid. Feel that this is more CHF. WBC 14.7>11.5. remains afebrile. Intubated in the ED for AMS. UDS negative. -LTVV strategy with tidal volumes of 4-8 cc/kg ideal body weight -Goal plateau pressures less than 30 and driving pressures less than 15 -Wean PEEP/FiO2 for SpO2 92-98% -VAP bundle -Daily SAT and SBT -PAD bundle with Precedex gtt and fentanyl gtt -RASS goal 0 to -1 -Follow intermittent CXR and ABG PRN -lasix as discussed -Check CRP. If elevated will start decadron. -Follow CXR -Follow up [facility] -Monitor fever/wbc curve   Acute on chronic CHF exacerbation  Ischemic cardiomyopathy HX  HTN, HLD, Echo from Jan 2020 with EF 15%, G3DD), CAD s/p PCI and DES 2015 (was on Brilinta but per [facility], apparently has not been taking). ECHO 9/20. technically difficult EF 20-25%, decreased LVSF. Troponin 61. EKG with no acute ST changes. CVP 20 per nursing. -Goal net negative, 40mg  q12h IV -Continue holding home antihypertensives and GDMT. -Pharmacy to conduct med review. [facility] plan pending. -Continue tele  Presumed OSA/OHS. -Consider CPAP at night once extubated, outpatient sleep study  CKD3b Creat 1.59>1.47, BUN 17. 1.9 L uop -Ensure renal perfusion. Goal MAP 65 or greater. -Furosemide as discussed today -Strict I&O's -Follow up AM creatinine   Best practice (evaluated daily):  Diet/type: NPO DVT prophylaxis: LMWH GI prophylaxis: PPI Lines: Central line Foley:  Yes, and it is still needed Code Status:  full code Last date of multidisciplinary goals of care discussion: Pending.  Critical care time: 31 minutes   [facility] MSN, APRN, AGACNP-BC [facility]  08/06/2021 , 8:22 AM  Please see [email or web address] for pager details  If no  response, please call [phone] After hours, please call Elink at [phone]

## 2021-08-06 NOTE — Evaluation (Addendum)
Clinical/Bedside Swallow Evaluation Patient Details  Name: Deborah Ho MRN: [id number] Date of Birth: 1951-02-28  Today's Date: 08/06/2021 Time: SLP Start Time (ACUTE ONLY): 1518 SLP Stop Time (ACUTE ONLY): 1536 SLP Time Calculation (min) (ACUTE ONLY): 18 min  Past Medical History:  Past Medical History:  Diagnosis Date   CAD (coronary artery disease) 05/03/14; 05/09/14   a. anterior STEMI with early in-stent thorombosis for missed dose of Brillinta s/p PCI with DESx 2 into LAD (04/2014)   CHF (congestive heart failure) (HCC)    GERD (gastroesophageal reflux disease)    Probable   HTN (hypertension)    Hyperlipidemia    Ischemic cardiomyopathy    a. 04/2014 ECHO with EF 45-50% b.  Repeat 2D echo 08/14/14 with EF down at 15%. Life vest placed   Non compliance w medication regimen    Tobacco use    Past Surgical History:  Past Surgical History:  Procedure Laterality Date   CHOLECYSTECTOMY     CORONARY ANGIOPLASTY WITH STENT PLACEMENT  05/03/14   STEMI- stent to LAD DES- Xience alpine   CORONARY ANGIOPLASTY WITH STENT PLACEMENT  05/09/14   STEMI- overlapping stent to LAD, pt had missed a dose of Brilinta   LEFT HEART CATHETERIZATION WITH CORONARY ANGIOGRAM N/A 05/03/2014   Procedure: LEFT HEART CATHETERIZATION WITH CORONARY ANGIOGRAM;  Surgeon: Peter M Swaziland, MD;  Location: [facility] CATH LAB;  Service: Cardiovascular;  Laterality: N/A;   LEFT HEART CATHETERIZATION WITH CORONARY ANGIOGRAM N/A 05/09/2014   Procedure: LEFT HEART CATHETERIZATION WITH CORONARY ANGIOGRAM;  Surgeon: Corky Crafts, MD;  Location: [facility] CATH LAB;  Service: Cardiovascular;  Laterality: N/A;   PARTIAL HYSTERECTOMY     PERCUTANEOUS STENT INTERVENTION  05/03/2014   Procedure: PERCUTANEOUS STENT INTERVENTION;  Surgeon: Peter M Swaziland, MD;  Location: [facility] CATH LAB;  Service: Cardiovascular;;  DES Prox LAD    HPI:  Pt is a 70 y.o. female who presented to [facility] ED 9/20 with dyspnea and diaphoresis. While in ED, she became  altered and could not protect her airway. ETT 9/20-9/21 (1123). Pt found to be COVID+. Yale completed and failed due to pt not drinking continuously. CXR 9/20: pulmonary edema with focal opacity in right hilar region concerning for PNA vs neoplasm. Repeat CXR 9/21 Increased opacity at the R and L lung base and increased interstitial markings. Findings may reflect  worsening interstitial edema and effusions. PMH includes GERD, CHF, STEMI,  HTN, HLD,  tobacco abuse, ischemic cardiomyopathy, CKD3b, prior AICD refusal, noncompliance.    Assessment / Plan / Recommendation  Clinical Impression  Pt was seen for bedside swallow evaluation post extubation at 1123. She denied a history of dysphagia PTA. Oral mechanism exam was [facility] for ROM and strength. 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 She tolerated all solids and liquids without overt s/sx of aspiration. Mastication and oral clearance were WNL and no symptom\s of pharyngeal residue demonstrated. Pt reported odynophagia which she rated 5-9/10 with increased severity with ice chips and advanced solids. A full liquid diet will be initiated at this time with likely advancement as odynophagia improves. SLP will follow pt briefly. SLP Visit Diagnosis: Dysphagia, unspecified (R13.10)    Aspiration Risk  Mild aspiration risk    Diet Recommendation Thin liquid (full liquids)   Liquid Administration via: Cup;Other (Comment) Medication Administration: Whole meds with puree Supervision: Patient able to self feed Compensations: Slow rate;Small sips/bites Postural Changes: Seated upright at  90 degrees    Other  Recommendations Oral Care Recommendations: Oral care BID    Recommendations for follow up therapy are one component of a multi-disciplinary discharge planning process, led by the attending physician.  Recommendations may be updated based on  patient status, additional functional criteria and insurance authorization.  Follow up Recommendations None      Frequency and Duration min 2x/week  1 week       Prognosis Prognosis for Safe Diet Advancement: Good      Swallow Study   General Date of Onset: 08/06/21 HPI: Pt is a 70 y.o. female who presented to [facility] ED 9/20 with dyspnea and diaphoresis. While in ED, she became altered and could not protect her airway. ETT 9/20-9/21 (1123). Pt found to be COVID+. Yale completed and failed due to pt not drinking continuously. CXR 9/20: pulmonary edema with focal opacity in right hilar region concerning for PNA vs neoplasm. Repeat CXR 9/21 Increased opacity at the R and L lung base and increased interstitial markings. Findings may reflect  worsening interstitial edema and effusions. PMH includes GERD, CHF, STEMI,  HTN, HLD,  tobacco abuse, ischemic cardiomyopathy, CKD3b, prior AICD refusal, noncompliance. Type of Study: Bedside Swallow Evaluation Previous Swallow Assessment: none Diet Prior to this Study: NPO Temperature Spikes Noted: No Respiratory Status: Nasal cannula History of Recent Intubation: Yes Length of Intubations (days): 1 days Date extubated: 08/06/21 Behavior/Cognition: Alert;Cooperative;Pleasant mood Oral Cavity Assessment: Within Functional Limits Oral Care Completed by SLP: No Oral Cavity - Dentition: Adequate natural dentition Vision: Functional for self-feeding Self-Feeding Abilities: Needs assist Patient Positioning: Upright in bed;Postural control adequate for testing Baseline Vocal Quality: Low vocal intensity;Hoarse Volitional Cough: Weak Volitional Swallow: Able to elicit    Oral/Motor/Sensory Function Overall Oral Motor/Sensory Function: Within functional limits   Ice Chips Ice chips: Within functional limits (with odynophagia 9/10) Presentation: Spoon   Thin Liquid Thin Liquid: Within functional limits (with odynophagia rated 9/10 on left and 5/10 on  right) Presentation: Straw    Nectar Thick Nectar Thick Liquid: Not tested   Honey Thick Honey Thick Liquid: Not tested   Puree Puree: Within functional limits (odynophagia 7/10) Presentation: Spoon   Solid     Solid: Within functional limits (odynophagia 9/10) Presentation: Self Fed     Serapio Edelson I. Vear Clock, MS, CCC-SLP Acute Rehabilitation Services Office number [phone] Pager [phone]  Scheryl Marten 08/06/2021,3:55 PM

## 2021-08-06 NOTE — H&P (Signed)
Please see Consult Note by Ihor Dow, PAC from 9/20.  Lynnell Catalan, MD [facility] ICU Physician [facility]  Pager: [phone] Or Epic Secure Chat After hours: [phone].  08/06/2021, 10:43 AM

## 2021-08-07 LAB — CBC
HCT: 38.7 % (ref 36.0–46.0)
Hemoglobin: 12.4 g/dL (ref 12.0–15.0)
MCH: 29.3 pg (ref 26.0–34.0)
MCHC: 32 g/dL (ref 30.0–36.0)
MCV: 91.5 fL (ref 80.0–100.0)
Platelets: 253 10*3/uL (ref 150–400)
RBC: 4.23 MIL/uL (ref 3.87–5.11)
RDW: 13 % (ref 11.5–15.5)
WBC: 11.2 10*3/uL — ABNORMAL HIGH (ref 4.0–10.5)
nRBC: 0 % (ref 0.0–0.2)

## 2021-08-07 LAB — MAGNESIUM: Magnesium: 2 mg/dL (ref 1.7–2.4)

## 2021-08-07 LAB — BASIC METABOLIC PANEL
Anion gap: 10 (ref 5–15)
BUN: 18 mg/dL (ref 8–23)
CO2: 25 mmol/L (ref 22–32)
Calcium: 9 mg/dL (ref 8.9–10.3)
Chloride: 102 mmol/L (ref 98–111)
Creatinine, Ser: 1.65 mg/dL — ABNORMAL HIGH (ref 0.44–1.00)
GFR, Estimated: 33 mL/min — ABNORMAL LOW (ref >60)
Glucose, Bld: 141 mg/dL — ABNORMAL HIGH (ref 70–99)
Potassium: 4.2 mmol/L (ref 3.5–5.1)
Sodium: 137 mmol/L (ref 135–145)

## 2021-08-07 LAB — HEMOGLOBIN A1C
Hgb A1c MFr Bld: 5.8 % — ABNORMAL HIGH (ref 4.8–5.6)
Mean Plasma Glucose: 120 mg/dL

## 2021-08-07 LAB — GLUCOSE, CAPILLARY
Glucose-Capillary: 119 mg/dL — ABNORMAL HIGH (ref 70–99)
Glucose-Capillary: 112 mg/dL — ABNORMAL HIGH (ref 70–99)

## 2021-08-07 LAB — PHOSPHORUS: Phosphorus: 3.6 mg/dL (ref 2.5–4.6)

## 2021-08-07 MED ORDER — DOCUSATE SODIUM 100 MG PO CAPS: 100.0000 mg | ORAL_CAPSULE | Freq: Two times a day (BID) | ORAL | Status: DC | PRN

## 2021-08-07 MED ORDER — PROMETHAZINE-CODEINE 6.25-10 MG/5ML PO SYRP
5.0000 mL | ORAL_SOLUTION | Freq: Four times a day (QID) | ORAL | Status: DC | PRN
Start: 2021-08-07 — End: 2021-08-09

## 2021-08-07 MED ORDER — POLYETHYLENE GLYCOL 3350 17 G PO PACK: 17.0000 g | PACK | Freq: Every day | ORAL | Status: DC | PRN

## 2021-08-07 MED ORDER — LORATADINE 10 MG PO TABS
10.0000 mg | ORAL_TABLET | Freq: Every day | ORAL | Status: DC
  Administered 2021-08-07 – 2021-08-09 (×3): 10 mg via ORAL
  Filled 2021-08-07 (×3): qty 1

## 2021-08-07 NOTE — Plan of Care (Signed)

## 2021-08-07 NOTE — Progress Notes (Signed)
Speech Language Pathology Treatment: Dysphagia  Patient Details Name: Deborah Ho MRN: [id number] DOB: 1951/06/05 Today's Date: 08/07/2021 Time: 0233-4356 SLP Time Calculation (min) (ACUTE ONLY): 14 min  Assessment / Plan / Recommendation Clinical Impression  Pt was seen for dysphagia treatment and she was cooperative throughout the session. Pt and RN reported that the pt has been tolerating the current diet and meds without overt s/sx of aspiration. Pt stated that her throat "feels so much better" and that swallowing is now more comfortable. Intermittent throat clearing and coughing were noted throughout the session and she attributed this to "phlegm" that she "cannot get up". Pt tolerated regular texture solids, and thin liquids via straw using individual and consecutive swallows without symptoms of oropharyngeal dysphagia. A regular texture diet and with liquids is recommended at this time and pt has verbalized agreement with this. Further skilled SLP services are not clinically indicated at this time.    HPI HPI: Pt is a 70 y.o. female who presented to [facility] ED 9/20 with dyspnea and diaphoresis. While in ED, she became altered and could not protect her airway. ETT 9/20-9/21 (1123). Pt found to be COVID+. Yale completed and failed due to pt not drinking continuously. CXR 9/20: pulmonary edema with focal opacity in right hilar region concerning for PNA vs neoplasm. Repeat CXR 9/21 Increased opacity at the R and L lung base and increased interstitial markings. Findings may reflect  worsening interstitial edema and effusions. PMH includes GERD, CHF, STEMI,  HTN, HLD,  tobacco abuse, ischemic cardiomyopathy, CKD3b, prior AICD refusal, noncompliance.      SLP Plan  Discharge SLP treatment due to (comment);All goals met      Recommendations for follow up therapy are one component of a multi-disciplinary discharge planning process, led by the attending physician.  Recommendations may be updated based  on patient status, additional functional criteria and insurance authorization.    Recommendations  Diet recommendations: Regular;Thin liquid Liquids provided via: Cup;Straw Medication Administration: Whole meds with puree Supervision: Patient able to self feed                Oral Care Recommendations: Oral care BID Follow up Recommendations: None SLP Visit Diagnosis: Dysphagia, unspecified (R13.10) Plan: Discharge SLP treatment due to (comment);All goals met       Deborah Ho I. Hardin Negus, [place], [place] Office number [phone] Pager [phone]  08/07/2021, 10:40 AM

## 2021-08-07 NOTE — Progress Notes (Signed)
PROGRESS NOTE    Deborah Ho  VVO:[id number] DOB: 07/22/1951 DOA: 08/05/2021 PCP: Grayce Sessions, NP    Brief Narrative:  70 year old female with history of hypertension, hyperlipidemia, ischemic cardiomyopathy, known chronic systolic heart failure with ejection fraction 25%, chronic kidney disease stage IIIa, noncompliance to medications presented to emergency room on 9/20 with shortness of breath and diaphoresis.  Patient tells me she was not feeling well for last 2 days, she was going home and waiting for bus where she became very short of breath and EMS was [place] bus station.  On arrival to the ER she was confused and unable to protect her airway.  She was subsequently intubated.  Chest x-ray with pulmonary edema with focal opacity right hilar region concerning for pneumonia or neoplasm.  Admitted to ICU.  Also found to have COVID-19 infection. 9/21, extubated to nasal cannula oxygen and room air.  Treated for congestive heart failure.   Assessment & Plan:   Active Problems:   Acute respiratory failure with hypoxia (HCC)  Acute hypoxic and hypercapnic respiratory failure probably due to CHF exacerbation and exacerbated by COVID-19 infection. Mechanical intubation and extubation 9/20-9/21.  Currently mostly on room air and occasionally on oxygen on mobility. Echocardiogram 9/20 with ejection fraction 20 to 25% as before. Good response to diuresis, Lasix 40 mg twice daily, changed to oral torsemide 40 mg daily today. Start mobilizing.  Respiratory therapy, deep breathing exercises and incentive spirometry. Continue to wean off oxygen. Patient is on aspirin high intensity statin, carvedilol.  She is intolerance to [facility] or other goal-directed medical therapy.  Reportedly patient was fired from heart failure clinic for noncompliance.  Patient tells me she takes medications seriously.  COVID-19 infection: Probably coinfection/minimally symptomatic.  Chest x-ray with some  evidence of bilateral infiltrates, however rapidly improving with diuretics. Not treated with COVID-19 directed therapies but with dexamethasone.  Continue dexamethasone for 10 days.  Breathing exercises as above. Unlikely bacterial infection, given 1 dose of IV antibiotics then discontinued.  Acute kidney injury with underlying history of chronic kidney disease stage IIIa: Baseline creatinine from previous reports about 1.4.  Slight worsening with use of IV diuresis.  Recheck tomorrow morning.  Changed to torsemide 40 mg daily.   Start mobilizing.  She has allergy to multiple medications including common cough medications.  We will use some Hycodan. Patient can transfer to telemetry bed.  Anticipate discharge home in next 24 to 48 hours.   DVT prophylaxis: enoxaparin (LOVENOX) injection 40 mg Start: 08/05/21 2200 SCDs Start: 08/05/21 1417   Code Status: Full code Family Communication: None.  Patient is communicating with her brother. Disposition Plan: Status is: Inpatient  Remains inpatient appropriate because:Inpatient level of care appropriate due to severity of illness  Dispo: The patient is from: Home              Anticipated d/c is to: Home              Patient currently is not medically stable to d/c.   Difficult to place patient No         Consultants:  PCCM  Procedures:  None  Antimicrobials:  None   Subjective: Patient seen and examined.  She has some wet cough and short winded on walking around in the room but at rest she feels better.  Without any fever.  On room air at rest and needing 2 L oxygen on mobility.  She tells me she is very compliant to blood pressure medications  and her diuretics.  Objective: Vitals:   08/07/21 1000 08/07/21 1100 08/07/21 1129 08/07/21 1200  BP: 122/72 (!) 87/59  114/71  Pulse: 83 95  91  Resp: 18 (!) 24  (!) 22  Temp:   98.7 F (37.1 C)   TempSrc:   Oral   SpO2: 93% 94%  93%  Weight:      Height:         Intake/Output Summary (Last 24 hours) at 08/07/2021 1307 Last data filed at 08/07/2021 0816 Gross per 24 hour  Intake 373.3 ml  Output 2575 ml  Net -2201.7 ml   Filed Weights   08/05/21 1622 08/06/21 0329 08/07/21 0323  Weight: 96.6 kg 109.1 kg 107 kg    Examination:  General exam: Chronically sick looking and debilitated.  Not in any distress.  Looks fairly comfortable at rest.  Anxious and mild distress on mobility. Respiratory system: No added sounds. Cardiovascular system: S1 & S2 heard, RRR.  No edema. Gastrointestinal system: Abdomen is nondistended, soft and nontender. No organomegaly or masses felt. Normal bowel sounds heard.  Obese and pendulous. Central nervous system: Alert and oriented. No focal neurological deficits. Extremities: Symmetric 5 x 5 power. Skin: No rashes, lesions or ulcers Psychiatry: Judgement and insight appear normal. Mood & affect appropriate.     Data Reviewed: I have personally reviewed following labs and imaging studies  CBC: Recent Labs  Lab 08/05/21 1144 08/05/21 1149 08/05/21 1413 08/06/21 0328 08/07/21 0319  WBC 14.7*  --   --  11.5* 11.2*  NEUTROABS 9.7*  --   --   --   --   HGB 14.3 13.9 13.6 11.8* 12.4  HCT 47.8* 41.0 40.0 37.2 38.7  MCV 98.6  --   --  91.9 91.5  PLT 321  --   --  219 253   Basic Metabolic Panel: Recent Labs  Lab 08/05/21 1144 08/05/21 1149 08/05/21 1413 08/06/21 0328 08/07/21 0319  NA 139 139 143 139 137  K 3.9 3.6 4.2 3.6 4.2  CL 107  --   --  106 102  CO2 16*  --   --  24 25  GLUCOSE 234*  --   --  109* 141*  BUN 17  --   --  17 18  CREATININE 1.59*  --   --  1.47* 1.65*  CALCIUM 8.9  --   --  8.8* 9.0  MG  --   --   --  2.0 2.0  PHOS  --   --   --  2.6 3.6   GFR: Estimated Creatinine Clearance: 39.1 mL/min (A) (by C-G formula based on SCr of 1.65 mg/dL (H)). Liver Function Tests: Recent Labs  Lab 08/05/21 1144  AST 24  ALT 18  ALKPHOS 171*  BILITOT 0.7  PROT 7.0  ALBUMIN 3.4*    No results for input(s): LIPASE, AMYLASE in the last 168 hours. No results for input(s): AMMONIA in the last 168 hours. Coagulation Profile: Recent Labs  Lab 08/05/21 1130  INR 1.0   Cardiac Enzymes: No results for input(s): CKTOTAL, CKMB, CKMBINDEX, TROPONINI in the last 168 hours. BNP (last 3 results) No results for input(s): PROBNP in the last 8760 hours. HbA1C: Recent Labs    08/06/21 1359  HGBA1C 5.8*   CBG: Recent Labs  Lab 08/06/21 1126 08/06/21 1539 08/06/21 2148 08/07/21 0759 08/07/21 1127  GLUCAP 107* 116* 137* 119* 112*   Lipid Profile: Recent Labs    08/06/21 0328  TRIG  121   Thyroid Function Tests: No results for input(s): TSH, T4TOTAL, FREET4, T3FREE, THYROIDAB in the last 72 hours. Anemia Panel: No results for input(s): VITAMINB12, FOLATE, FERRITIN, TIBC, IRON, RETICCTPCT in the last 72 hours. Sepsis Labs: Recent Labs  Lab 08/05/21 1130 08/05/21 1313 08/06/21 0328  LATICACIDVEN 3.2* 2.8* 1.3    Recent Results (from the past 240 hour(s))  Resp Panel by RT-PCR (Flu A&B, Covid) Nasopharyngeal Swab     Status: Abnormal   Collection Time: 08/05/21 11:08 AM   Specimen: Nasopharyngeal Swab; Nasopharyngeal(NP) swabs in vial transport medium  Result Value Ref Range Status   SARS Coronavirus 2 by RT PCR POSITIVE (A) NEGATIVE Final    Comment: RESULT CALLED TO, READ BACK BY AND VERIFIED WITH: RN ELILY SHEFFER [id number] AT 1430 BY CM (NOTE) SARS-CoV-2 target nucleic acids are DETECTED.  The SARS-CoV-2 RNA is generally detectable in upper respiratory specimens during the acute phase of infection. Positive results are indicative of the presence of the identified virus, but do not rule out bacterial infection or co-infection with other pathogens not detected by the test. Clinical correlation with patient history and other diagnostic information is necessary to determine patient infection status. The expected result is Negative.  Fact Sheet for  Patients: BloggerCourse.com  Fact Sheet for Healthcare Providers: SeriousBroker.it  This test is not yet approved or cleared by the [place] FDA and  has been authorized for detection and/or diagnosis of SARS-CoV-2 by FDA under an Emergency Use Authorization (EUA).  This EUA will remain in effect (meaning this test ca n be used) for the duration of  the COVID-19 declaration under Section 564(b)(1) of the Act, 21 U.S.C. section 360bbb-3(b)(1), unless the authorization is terminated or revoked sooner.     Influenza A by PCR NEGATIVE NEGATIVE Final   Influenza B by PCR NEGATIVE NEGATIVE Final    Comment: (NOTE) The Xpert Xpress SARS-CoV-2/FLU/RSV plus assay is intended as an aid in the diagnosis of influenza from Nasopharyngeal swab specimens and should not be used as a sole basis for treatment. Nasal washings and aspirates are unacceptable for Xpert Xpress SARS-CoV-2/FLU/RSV testing.  Fact Sheet for Patients: BloggerCourse.com  Fact Sheet for Healthcare Providers: SeriousBroker.it  This test is not yet approved or cleared by the [place] FDA and has been authorized for detection and/or diagnosis of SARS-CoV-2 by FDA under an Emergency Use Authorization (EUA). This EUA will remain in effect (meaning this test can be used) for the duration of the COVID-19 declaration under Section 564(b)(1) of the Act, 21 U.S.C. section 360bbb-3(b)(1), unless the authorization is terminated or revoked.  Performed at [facility], [address], [place], [place] [address]   Culture, blood (routine x 2)     Status: None (Preliminary result)   Collection Time: 08/05/21 11:44 AM   Specimen: BLOOD  Result Value Ref Range Status   Specimen Description BLOOD SITE NOT SPECIFIED  Final   Special Requests   Final    BOTTLES DRAWN AEROBIC AND ANAEROBIC Blood Culture adequate volume    Culture   Final    NO GROWTH 2 DAYS Performed at [facility], [address] [place], [place] [address]    Report Status PENDING  Incomplete  Culture, blood (routine x 2)     Status: None (Preliminary result)   Collection Time: 08/05/21 11:50 AM   Specimen: BLOOD LEFT ARM  Result Value Ref Range Status   Specimen Description BLOOD LEFT ARM  Final   Special  Requests   Final    BOTTLES DRAWN AEROBIC AND ANAEROBIC Blood Culture results may not be optimal due to an inadequate volume of blood received in culture bottles   Culture   Final    NO GROWTH 2 DAYS Performed at [facility], [address] [place], [place] [address]    Report Status PENDING  Incomplete  MRSA Next Gen by PCR, Nasal     Status: None   Collection Time: 08/05/21  4:43 PM   Specimen: Nasal Mucosa; Nasal Swab  Result Value Ref Range Status   MRSA by PCR Next Gen NOT DETECTED NOT DETECTED Final    Comment: (NOTE) The GeneXpert MRSA Assay (FDA approved for NASAL specimens only), is one component of a comprehensive MRSA colonization surveillance program. It is not intended to diagnose MRSA infection nor to guide or monitor treatment for MRSA infections. Test performance is not FDA approved in patients less than 47 years old. Performed at [facility], [address], [place], [place] [address]   Respiratory (~20 pathogens) panel by PCR     Status: None   Collection Time: 08/06/21  1:28 AM   Specimen: Nasopharyngeal Swab; Respiratory  Result Value Ref Range Status   Adenovirus NOT DETECTED NOT DETECTED Final   Coronavirus 229E NOT DETECTED NOT DETECTED Final    Comment: (NOTE) The Coronavirus on the Respiratory Panel, DOES NOT test for the novel  Coronavirus (2019 nCoV)    Coronavirus HKU1 NOT DETECTED NOT DETECTED Final   Coronavirus NL63 NOT DETECTED NOT DETECTED Final   Coronavirus OC43 NOT DETECTED NOT DETECTED Final   Metapneumovirus NOT DETECTED NOT DETECTED Final   Rhinovirus /  Enterovirus NOT DETECTED NOT DETECTED Final   Influenza A NOT DETECTED NOT DETECTED Final   Influenza B NOT DETECTED NOT DETECTED Final   Parainfluenza Virus 1 NOT DETECTED NOT DETECTED Final   Parainfluenza Virus 2 NOT DETECTED NOT DETECTED Final   Parainfluenza Virus 3 NOT DETECTED NOT DETECTED Final   Parainfluenza Virus 4 NOT DETECTED NOT DETECTED Final   Respiratory Syncytial Virus NOT DETECTED NOT DETECTED Final   Bordetella pertussis NOT DETECTED NOT DETECTED Final   Bordetella Parapertussis NOT DETECTED NOT DETECTED Final   Chlamydophila pneumoniae NOT DETECTED NOT DETECTED Final   Mycoplasma pneumoniae NOT DETECTED NOT DETECTED Final    Comment: Performed at [facility], [address] [place], [place] [address]         Radiology Studies: CT CHEST ABDOMEN PELVIS W CONTRAST  Result Date: 08/05/2021 CLINICAL DATA:  Shortness of breath.  Abnormal chest x-ray EXAM: CT CHEST, ABDOMEN, AND PELVIS WITH CONTRAST TECHNIQUE: Multidetector CT imaging of the chest, abdomen and pelvis was performed following the standard protocol during bolus administration of intravenous contrast. CONTRAST:  70mL OMNIPAQUE IOHEXOL 350 MG/ML SOLN COMPARISON:  Chest x-ray 08/05/2021.  CT abdomen pelvis 12/15/2018. FINDINGS: CT CHEST FINDINGS Cardiovascular: Heart size is mildly enlarged. No pericardial effusion. Thoracic aorta is nonaneurysmal. Atherosclerotic calcifications of the aorta and coronary arteries. Central pulmonary vasculature appears within normal limits. A right IJ central venous catheter terminates at the superior cavoatrial junction. Mediastinum/Nodes: Mildly prominent right paratracheal lymph node measuring 10 mm with preservation of the fatty hilum (series 3, image 17). No axillary or hilar lymphadenopathy. Endotracheal tube terminates approximately 1.7 cm above the carina. Trachea otherwise unremarkable. Enteric tube is present within the esophagus terminating within the gastric body.  Subcentimeter nodule at the thyroid isthmus. Not clinically significant; no  follow-up imaging recommended (ref: J Am Coll Radiol. 2015 Feb;12(2): 143-50). Lungs/Pleura: Small bilateral pleural effusions, right greater than left. Patchy airspace opacities within the dependent portions of the bilateral upper lobes and lower lobes, right greater than left which may represent a combination of atelectasis and pneumonia. No discrete mass lesion identified. No pneumothorax. Musculoskeletal: No chest wall mass or suspicious bone lesions identified. CT ABDOMEN PELVIS FINDINGS Hepatobiliary: No focal liver abnormality is seen. Status post cholecystectomy. No biliary dilatation. Pancreas: Unremarkable. No pancreatic ductal dilatation or surrounding inflammatory changes. Spleen: Normal in size without focal abnormality. Adrenals/Urinary Tract: Unremarkable adrenal glands. 4.7 cm upper pole right renal cyst. 2.2 cm lower pole left renal cyst. No renal stone or hydronephrosis. Ureters are unremarkable. Urinary bladder is decompressed by Foley catheter. Stomach/Bowel: Enteric tube terminates within the gastric body. Stomach is otherwise unremarkable. No dilated loops of bowel. No focal bowel wall thickening or inflammatory changes. Vascular/Lymphatic: Scattered aortoiliac atherosclerotic calcifications without aneurysm. No abdominopelvic lymphadenopathy. Reproductive: Status post hysterectomy. No adnexal masses. Other: No free fluid. No abdominopelvic fluid collection. No pneumoperitoneum. No abdominal wall hernia. Musculoskeletal: No acute or significant osseous findings. IMPRESSION: 1. Patchy airspace opacities within the dependent portions of the bilateral upper lobes and lower lobes, right greater than left, may represent a combination of atelectasis and pneumonia. 2. Small bilateral pleural effusions, right greater than left. 3. Mildly prominent right paratracheal lymph node, likely reactive. 4. No acute abdominopelvic  findings. Aortic Atherosclerosis (ICD10-I70.0). Electronically Signed   By: Duanne Guess D.O.   On: 08/05/2021 16:41   DG Chest Port 1 View  Result Date: 08/06/2021 CLINICAL DATA:  Respiratory failure in a 70 year old female. EXAM: PORTABLE CHEST 1 VIEW COMPARISON:  Comparison made with August 05, 2021. FINDINGS: RIGHT IJ central venous line terminates at the caval to atrial junction. Endotracheal tube is approximately 3 cm from the carina in the mid trachea. Gastric tube courses through in off the field of the radiograph into the upper abdomen. Cardiomediastinal contours remain enlarged. Increased opacity at the RIGHT and LEFT lung base and increased interstitial markings since the prior study. No visible pneumothorax. EKG leads project over the chest. On limited assessment no acute skeletal process. IMPRESSION: Increased opacity at the RIGHT and LEFT lung base and increased interstitial markings since the prior study. Findings may reflect worsening interstitial edema and effusions. Basilar airspace process likely increased as well. LEFT hemidiaphragm and medial RIGHT hemidiaphragm now obscured. Electronically Signed   By: Donzetta Kohut M.D.   On: 08/06/2021 07:26   DG Chest Port 1 View  Result Date: 08/05/2021 CLINICAL DATA:  Central line placement EXAM: PORTABLE CHEST 1 VIEW COMPARISON:  08/05/2021 FINDINGS: Endotracheal tube tip is 3 cm above the carina. Orogastric or nasogastric tube enters the abdomen. New right internal jugular central line tip in the SVC just above the right atrium. No pneumothorax. Interstitial pulmonary edema persists. IMPRESSION: New right internal jugular central line tip at the SVC just above the right atrium. No pneumothorax. Persistent interstitial edema. Electronically Signed   By: Paulina Fusi M.D.   On: 08/05/2021 13:56   ECHOCARDIOGRAM COMPLETE  Result Date: 08/05/2021    ECHOCARDIOGRAM REPORT   Patient Name:   DAIL MEECE Date of Exam: 08/05/2021 Medical Rec  #:  [id number]      Height:       65.0 in Accession #:    [id number]     Weight:       213.8 lb Date of Birth:  09/30/1951  BSA:          2.035 m Patient Age:    69 years       BP:           107/68 mmHg Patient Gender: F              HR:           77 bpm. Exam Location:  Inpatient Procedure: 2D Echo, Cardiac Doppler, Color Doppler and Intracardiac            Opacification Agent STAT ECHO Indications:     Dilated cardiomyopathy  History:         Patient has prior history of Echocardiogram examinations, most                  recent 11/28/2018. Previous Myocardial Infarction; Risk                  Factors:Current Smoker, Hypertension and Dyslipidemia. Ischemic                  cardiomyopathy.  Sonographer:     Ross Ludwig RDCS (AE) Referring Phys:  [id number] Lorin Glass Diagnosing Phys: Armanda Magic MD  Sonographer Comments: Technically challenging study due to limited acoustic windows, Technically difficult study due to poor echo windows, suboptimal parasternal window, suboptimal apical window, suboptimal subcostal window, echo performed with patient supine and on artificial respirator and patient is morbidly obese. Image acquisition challenging due to patient body habitus and Image acquisition challenging due to COPD. COVID-19. Technically difficult study even with Definity usage. Patient on stretched in ED with limited ability to position. IMPRESSIONS  1. Left ventricular ejection fraction, by estimation, is 20 to 25% without definity but may be higher. Definity images are very poor in quality. The left ventricle has severely decreased function. There is mild concentric left ventricular hypertrophy. Left ventricular diastolic function could not be evaluated.  2. Right ventricular systolic function was not well visualized. The right ventricular size is not well visualized. Tricuspid regurgitation signal is inadequate for assessing PA pressure.  3. The mitral valve was not well visualized. Mild mitral valve  regurgitation. No evidence of mitral stenosis.  4. The aortic valve was not well visualized. Aortic valve regurgitation is not visualized. No aortic stenosis is present.  5. The inferior vena cava is normal in size with <50% respiratory variability, suggesting right atrial pressure of 8 mmHg. FINDINGS  Left Ventricle: Left ventricular ejection fraction, by estimation, is 20 to 25%. The left ventricle has severely decreased function. The left ventricle demonstrates global hypokinesis. Definity contrast agent was given IV to delineate the left ventricular endocardial borders. The left ventricular internal cavity size was normal in size. There is mild concentric left ventricular hypertrophy. Left ventricular diastolic function could not be evaluated. Right Ventricle: The right ventricular size is not well visualized. Right vetricular wall thickness was not assessed. Right ventricular systolic function was not well visualized. Tricuspid regurgitation signal is inadequate for assessing PA pressure. Left Atrium: Left atrial size was not well visualized. Right Atrium: Right atrial size was not well visualized. Pericardium: There is no evidence of pericardial effusion. Mitral Valve: The mitral valve was not well visualized. Mild mitral valve regurgitation. No evidence of mitral valve stenosis. Tricuspid Valve: The tricuspid valve is not well visualized. Tricuspid valve regurgitation is not demonstrated. No evidence of tricuspid stenosis. Aortic Valve: The aortic valve was not well visualized. Aortic valve regurgitation is not visualized. No aortic stenosis is present.  Aortic valve mean gradient measures 2.0 mmHg. Aortic valve peak gradient measures 4.1 mmHg. Aortic valve area, by VTI measures 1.86 cm. Pulmonic Valve: The pulmonic valve was not well visualized. Pulmonic valve regurgitation is not visualized. No evidence of pulmonic stenosis. Aorta: The aortic root is normal in size and structure. Venous: The inferior vena  cava is normal in size with less than 50% respiratory variability, suggesting right atrial pressure of 8 mmHg. IAS/Shunts: No atrial level shunt detected by color flow Doppler.  LEFT VENTRICLE PLAX 2D LVIDd:         5.50 cm LVIDs:         5.10 cm LV PW:         1.30 cm LV IVS:        1.20 cm LVOT diam:     2.00 cm LV SV:         34 LV SV Index:   17 LVOT Area:     3.14 cm  RIGHT VENTRICLE            IVC RV Basal diam:  3.80 cm    IVC diam: 2.00 cm RV S prime:     9.07 cm/s LEFT ATRIUM             Index       RIGHT ATRIUM           Index LA diam:        4.10 cm 2.01 cm/m  RA Area:     20.20 cm LA Vol (A2C):   77.6 ml 38.13 ml/m RA Volume:   67.50 ml  33.17 ml/m LA Vol (A4C):   63.2 ml 31.06 ml/m LA Biplane Vol: 70.0 ml 34.40 ml/m  AORTIC VALVE AV Area (Vmax):    2.35 cm AV Area (Vmean):   2.10 cm AV Area (VTI):     1.86 cm AV Vmax:           101.00 cm/s AV Vmean:          67.500 cm/s AV VTI:            0.184 m AV Peak Grad:      4.1 mmHg AV Mean Grad:      2.0 mmHg LVOT Vmax:         75.40 cm/s LVOT Vmean:        45.200 cm/s LVOT VTI:          0.109 m LVOT/AV VTI ratio: 0.59  AORTA Ao Root diam: 3.00 cm Ao Asc diam:  3.30 cm MR Peak grad: 70.6 mmHg MR Mean grad: 44.0 mmHg   SHUNTS MR Vmax:      420.00 cm/s Systemic VTI:  0.11 m MR Vmean:     310.0 cm/s  Systemic Diam: 2.00 cm Armanda Magic MD Electronically signed by Armanda Magic MD Signature Date/Time: 08/05/2021/3:49:30 PM    Final (Updated)         Scheduled Meds:  atorvastatin  80 mg Oral QHS   carvedilol  6.25 mg Oral BID WC   Chlorhexidine Gluconate Cloth  6 each Topical Q0600   cholecalciferol  1,000 Units Oral Q14 Days   dexamethasone  6 mg Oral Daily   enoxaparin (LOVENOX) injection  40 mg Subcutaneous Q24H   isosorbide-hydrALAZINE  1 tablet Oral TID   loratadine  10 mg Oral Daily   potassium chloride  20 mEq Oral BID   sodium chloride flush  10-40 mL Intracatheter Q12H   torsemide  40 mg Oral  Daily   Continuous Infusions:   LOS:  2 days    Time spent: 32 minutes    Dorcas Carrow, MD [facility] Pager [phone]

## 2021-08-07 NOTE — Progress Notes (Signed)
Pt unable to find upper partial dentures in belongings bag. Called ED and security in which both unable to locate this item. Pt notified.

## 2021-08-08 DIAGNOSIS — I629 Nontraumatic intracranial hemorrhage, unspecified: Secondary | ICD-10-CM

## 2021-08-08 LAB — CBC WITH DIFFERENTIAL/PLATELET
Abs Immature Granulocytes: 0.07 10*3/uL (ref 0.00–0.07)
Basophils Absolute: 0 10*3/uL (ref 0.0–0.1)
Basophils Relative: 0 %
Eosinophils Absolute: 0 10*3/uL (ref 0.0–0.5)
Eosinophils Relative: 0 %
HCT: 37.7 % (ref 36.0–46.0)
Hemoglobin: 12 g/dL (ref 12.0–15.0)
Immature Granulocytes: 0 %
Lymphocytes Relative: 7 %
Lymphs Abs: 1.1 10*3/uL (ref 0.7–4.0)
MCH: 29.1 pg (ref 26.0–34.0)
MCHC: 31.8 g/dL (ref 30.0–36.0)
MCV: 91.3 fL (ref 80.0–100.0)
Monocytes Absolute: 0.8 10*3/uL (ref 0.1–1.0)
Monocytes Relative: 5 %
Neutro Abs: 15 10*3/uL — ABNORMAL HIGH (ref 1.7–7.7)
Neutrophils Relative %: 88 %
Platelets: 265 10*3/uL (ref 150–400)
RBC: 4.13 MIL/uL (ref 3.87–5.11)
RDW: 13 % (ref 11.5–15.5)
WBC: 17 10*3/uL — ABNORMAL HIGH (ref 4.0–10.5)
nRBC: 0 % (ref 0.0–0.2)

## 2021-08-08 LAB — BASIC METABOLIC PANEL
Anion gap: 10 (ref 5–15)
BUN: 33 mg/dL — ABNORMAL HIGH (ref 8–23)
CO2: 24 mmol/L (ref 22–32)
Calcium: 9 mg/dL (ref 8.9–10.3)
Chloride: 103 mmol/L (ref 98–111)
Creatinine, Ser: 1.85 mg/dL — ABNORMAL HIGH (ref 0.44–1.00)
GFR, Estimated: 29 mL/min — ABNORMAL LOW (ref >60)
Glucose, Bld: 131 mg/dL — ABNORMAL HIGH (ref 70–99)
Potassium: 4 mmol/L (ref 3.5–5.1)
Sodium: 137 mmol/L (ref 135–145)

## 2021-08-08 LAB — PHOSPHORUS: Phosphorus: 3.7 mg/dL (ref 2.5–4.6)

## 2021-08-08 LAB — MAGNESIUM: Magnesium: 2.2 mg/dL (ref 1.7–2.4)

## 2021-08-08 MED ORDER — SODIUM CHLORIDE 0.9% FLUSH
3.0000 mL | Freq: Two times a day (BID) | INTRAVENOUS | Status: DC
  Administered 2021-08-08 (×2): 3 mL via INTRAVENOUS

## 2021-08-08 MED ORDER — CARVEDILOL 3.125 MG PO TABS
3.1250 mg | ORAL_TABLET | Freq: Two times a day (BID) | ORAL | Status: DC
  Administered 2021-08-08 – 2021-08-09 (×2): 3.125 mg via ORAL
  Filled 2021-08-08 (×2): qty 1

## 2021-08-08 NOTE — TOC Initial Note (Addendum)
Transition of Care [facility]) - Initial/Assessment Note    Patient Details  Name: Deborah Ho MRN: [id number] Date of Birth: 01/15/1951  Transition of Care [facility]) CM/SW Contact:    Tom-Johnson, Hershal Coria, RN Phone Number: 08/08/2021, 4:57 PM  Clinical Narrative:                 CM spoke with patient via phone. On contact and airborne precautions for Covid. Patient states she lives alone in an apartment. Independent with care prior to hospitalization. Has 4 adult children who lives out of town and supportive. Has a sister who is supportive as well and want her to be her contact. Will call CM when she can get sister's number. Able to drives self and states she sometimes uses the bus for transportation. Does not have any DME's and has not used home health services in the past. . Awaiting PT/OT to eval and treat.  CM will continue to follow with needs.    Barriers to Discharge: Continued Medical Work up   Patient Goals and CMS Choice Patient states their goals for this hospitalization and ongoing recovery are:: To go home CMS Medicare.gov Compare Post Acute Care list provided to:: Patient Choice offered to / list presented to : Patient  Expected Discharge Plan and Services     Discharge Planning Services: CM Consult   Living arrangements for the past 2 months: Apartment                                      Prior Living Arrangements/Services Living arrangements for the past 2 months: Apartment Lives with:: Self Patient language and need for interpreter reviewed:: Yes Do you feel safe going back to the place where you live?: Yes      Need for Family Participation in Patient Care: Yes (Comment) Care giver support system in place?: Yes (comment)   Criminal Activity/Legal Involvement Pertinent to Current Situation/Hospitalization: No - Comment as needed  Activities of Daily Living      Permission Sought/Granted Permission sought to share information with : Case  Manager, Family Supports, Magazine features editor Permission granted to share information with : Yes, Verbal Permission Granted              Emotional Assessment Appearance:: Appears stated age Attitude/Demeanor/Rapport: Engaged Affect (typically observed): Accepting, Appropriate, Hopeful Orientation: : Oriented to Self, Oriented to Place, Oriented to  Time, Oriented to Situation Alcohol / Substance Use: Not Applicable Psych Involvement: No (comment)  Admission diagnosis:  Shortness of breath [R06.02] Respiratory failure (HCC) [J96.90] Intracranial hemorrhage (HCC) [I62.9] Acute respiratory failure with hypoxia (HCC) [J96.01] Patient Active Problem List   Diagnosis Date Noted   Acute respiratory failure with hypoxia (HCC) 08/05/2021   Palliative care by specialist    Goals of care, counseling/discussion    Acute on chronic diastolic CHF (congestive heart failure) (HCC) 10/12/2019   Acute systolic CHF (congestive heart failure) (HCC) 10/11/2019   Vitamin D deficiency 09/07/2019   Obesity with body mass index 30 or greater 09/07/2019   Abdominal pain 12/16/2018   Acute CHF (congestive heart failure) (HCC) 12/15/2018   Acute exacerbation of CHF (congestive heart failure) (HCC) 11/28/2018   Acute on chronic systolic CHF (congestive heart failure) (HCC) 11/27/2018   Acute on chronic combined systolic and diastolic CHF (congestive heart failure) (HCC) 04/02/2017   Noncompliance with medication regimen 04/02/2017   Intractable nausea and vomiting 02/18/2017  Hyperlipidemia    GERD (gastroesophageal reflux disease)    CHF (congestive heart failure) (HCC)    Cellulitis of left lower extremity    Cough productive of clear sputum    Cellulitis of left lower leg    Hypokalemia 01/28/2017   Peripheral edema    Abdominal distension 06/26/2016   Anasarca 06/26/2016   Chronic systolic heart failure (HCC) 02/20/2016   CKD (chronic kidney disease), stage III (HCC)    Adjustment  disorder with mixed anxiety and depressed mood 02/28/2015   Pleural effusion on right    Essential hypertension    Angioedema of lips 08/17/2014   Ischemic cardiomyopathy 08/13/2014   Coronary atherosclerosis of native coronary artery 05/10/2014   History of ST elevation myocardial infarction (STEMI) 05/09/2014   HLD (hyperlipidemia) 05/06/2014   Tobacco abuse 05/06/2014   Cardiomyopathy, ischemic 05/06/2014   Elevated LFTs 05/06/2014   PCP:  Grayce Sessions, NP Pharmacy:   [facility] - [place] - [address] [place] ([place]) [place] [address] Phone: [phone] Fax: [phone]  [facility] [address] [place] [address] Phone: [phone] Fax: [phone]     Social Determinants of Health (SDOH) Interventions    Readmission Risk Interventions Readmission Risk Prevention Plan 10/21/2019 10/17/2019 04/27/2019  Transportation Screening Complete Complete Complete  PCP or Specialist Appt within 3-5 Days Not Complete - Complete  HRI or Home Care Consult Complete Complete Complete  Social Work Consult for Recovery Care Planning/Counseling Complete Complete Complete  Palliative Care Screening Not Applicable Complete Not Applicable  Medication Review Oceanographer) Complete Complete Complete  Some recent data might be hidden

## 2021-08-08 NOTE — Progress Notes (Signed)
[facility] Progress Note  Patient: Deborah Ho    NTI:[id number]  DOA: 08/05/2021     Date of Service: the patient was seen and examined on 08/08/2021  Brief hospital course: Past medical history of HTN, HLD, HFrEF, CKD 3A, noncompliance.  Presents with complaints of shortness of breath and diaphoresis.  Found to have acute on chronic hypoxic and hypercapnic respiratory failure secondary to CHF exacerbation.  Patient also had an incidental COVID-19 infection. Required ICU admission with peak ventilation.  Currently plan is monitor for improvement in renal function with.  Subjective: Continues to have shortness of breath.  No nausea or vomiting no fever no chills.  Also reports cough.  No diarrhea.  Assessment and Plan: 1.  Acute hypoxic and hypercapnic respiratory failure. Acute COVID-19 infection. Acute on chronic systolic CHF Echocardiogram shows ejection fraction 20 to 25%. Mechanical intubation and extubation on 09/20. Currently on room air. Lasix currently on hold.  Monitor. Continue aspirin, carvedilol as well as statin. Due to hypotension I will reduce the dose of the carvedilol. Not a good candidate for Entresto or Imdur given her renal function and hypotension.  2.  COVID-19 infection Not treated with COVID-19 infection. Was on dexamethasone.  We will continue. Monitor.  3.  Acute kidney injury on chronic kidney disease stage IIIa Baseline around 1.5 Currently around 1.8. Patient was on diuretics.  Currently on hold. Will improve blood pressure by holding antihypertensive medications.  4.  Cough. Continue therapy.  5.  Leukocytosis Due to steroids.  Monitor.  6.  Obesity Placing the patient at high risk for poor outcome. Body mass index is 38.85 kg/m.   DVT Prophylaxis:   enoxaparin (LOVENOX) injection 40 mg Start: 08/05/21 2200 SCDs Start: 08/05/21 1417    Pt is Full code.  Communication: no family was present at bedside, at the time of  interview.   Data Reviewed: I have personally reviewed and interpreted daily labs, tele strips, imaging. My leukocytosis.  Hemoglobin stable.  Serum creatinine 1.85.  BUN elevated  Physical Exam:  General: Appear in mild distress, no Rash; Oral Mucosa Clear, moist. no Abnormal Neck Mass Or lumps, Conjunctiva normal  Cardiovascular: S1 and S2 Present, no Murmur, Respiratory: good respiratory effort, Bilateral Air entry present and bilateral Crackles, no wheezes Abdomen: Bowel Sound present, Soft and no tenderness Extremities: no Pedal edema Neurology: alert and oriented to time, place, and person affect appropriate. no new focal deficit Gait not checked due to patient safety concerns  Vitals:   08/08/21 1500 08/08/21 1507 08/08/21 1600 08/08/21 1800  BP: 124/72  121/66 113/63  Pulse: 88  77 79  Resp: (!) 21  (!) 24 (!) 23  Temp:  98 F (36.7 C)    TempSrc:  Axillary    SpO2: 95%  94% 93%  Weight:      Height:        Disposition:  Status is: Inpatient  Remains inpatient appropriate because:Inpatient level of care appropriate due to severity of illness  Dispo: The patient is from: Home              Anticipated d/c is to: SNF              Patient currently is not medically stable to d/c.   Difficult to place patient No        Time spent: 35 minutes. I reviewed all nursing notes, pharmacy notes, vitals, pertinent old records. I have discussed plan of care as described above with RN.  Author: Lynden Oxford, MD [facility] 08/08/2021 7:13 PM  To reach On-call, see care teams to locate the attending and reach out via [email or web address]. Between 7PM-7AM, please contact night-coverage If you still have difficulty reaching the attending provider, please page the [facility] (Director on Call) for [facility] on amion for assistance.

## 2021-08-09 LAB — CBC WITH DIFFERENTIAL/PLATELET
Abs Immature Granulocytes: 0.13 10*3/uL — ABNORMAL HIGH (ref 0.00–0.07)
Basophils Absolute: 0 10*3/uL (ref 0.0–0.1)
Basophils Relative: 0 %
Eosinophils Absolute: 0 10*3/uL (ref 0.0–0.5)
Eosinophils Relative: 0 %
HCT: 38 % (ref 36.0–46.0)
Hemoglobin: 12 g/dL (ref 12.0–15.0)
Immature Granulocytes: 1 %
Lymphocytes Relative: 7 %
Lymphs Abs: 1.3 10*3/uL (ref 0.7–4.0)
MCH: 28.9 pg (ref 26.0–34.0)
MCHC: 31.6 g/dL (ref 30.0–36.0)
MCV: 91.6 fL (ref 80.0–100.0)
Monocytes Absolute: 0.8 10*3/uL (ref 0.1–1.0)
Monocytes Relative: 5 %
Neutro Abs: 14.7 10*3/uL — ABNORMAL HIGH (ref 1.7–7.7)
Neutrophils Relative %: 87 %
Platelets: 275 10*3/uL (ref 150–400)
RBC: 4.15 MIL/uL (ref 3.87–5.11)
RDW: 12.8 % (ref 11.5–15.5)
WBC: 16.9 10*3/uL — ABNORMAL HIGH (ref 4.0–10.5)
nRBC: 0 % (ref 0.0–0.2)

## 2021-08-09 LAB — BASIC METABOLIC PANEL
Anion gap: 9 (ref 5–15)
BUN: 36 mg/dL — ABNORMAL HIGH (ref 8–23)
CO2: 22 mmol/L (ref 22–32)
Calcium: 8.7 mg/dL — ABNORMAL LOW (ref 8.9–10.3)
Chloride: 105 mmol/L (ref 98–111)
Creatinine, Ser: 1.69 mg/dL — ABNORMAL HIGH (ref 0.44–1.00)
GFR, Estimated: 32 mL/min — ABNORMAL LOW (ref >60)
Glucose, Bld: 129 mg/dL — ABNORMAL HIGH (ref 70–99)
Potassium: 4.2 mmol/L (ref 3.5–5.1)
Sodium: 136 mmol/L (ref 135–145)

## 2021-08-09 MED ORDER — DEXAMETHASONE 6 MG PO TABS: 6.0000 mg | ORAL_TABLET | Freq: Every day | ORAL | 0 refills | Status: AC

## 2021-08-09 MED ORDER — CARVEDILOL 3.125 MG PO TABS: 3.1250 mg | ORAL_TABLET | Freq: Two times a day (BID) | ORAL | 0 refills | Status: AC

## 2021-08-09 MED ORDER — TORSEMIDE 20 MG PO TABS: 20.0000 mg | ORAL_TABLET | Freq: Every day | ORAL | 0 refills | Status: AC

## 2021-08-09 MED ORDER — DOCUSATE SODIUM 100 MG PO CAPS: 100.0000 mg | ORAL_CAPSULE | Freq: Two times a day (BID) | ORAL | 0 refills | Status: AC | PRN

## 2021-08-09 NOTE — Evaluation (Signed)
Physical Therapy Evaluation and Discharge Patient Details Name: Deborah Ho MRN: [id number] DOB: 08/24/1951 Today's Date: 08/09/2021  History of Present Illness  Pt is a 70 y.o. F who presents with acute hypoxic and hypercapnic respiratory failure in setting of acute on chronic systolic CHF and acute COVID-19 infection. Pt requiring intubation 9/20-9/21. Echocardiogram shows ejection fraction 20-25%. Significant PMH: HTN, HLD, HFrEF, CKD 3A.  Clinical Impression  Patient evaluated by Physical Therapy with no further acute PT needs identified. Pt ambulating room distances ~100 feet with no assistive device or physical difficulty. SpO2 93-96% on RA, HR stable in 90's. Education provided regarding daily weights, generalized walking program and IS use. All education has been completed and the patient has no further questions. No follow-up Physical Therapy or equipment needs. PT is signing off. Thank you for this referral.      Recommendations for follow up therapy are one component of a multi-disciplinary discharge planning process, led by the attending physician.  Recommendations may be updated based on patient status, additional functional criteria and insurance authorization.  Follow Up Recommendations No PT follow up;Supervision - Intermittent    Equipment Recommendations  None recommended by PT    Recommendations for Other Services       Precautions / Restrictions Precautions Precautions: None Restrictions Weight Bearing Restrictions: No      Mobility  Bed Mobility               General bed mobility comments: OOB in chair    Transfers Overall transfer level: Independent Equipment used: None                Ambulation/Gait Ambulation/Gait assistance: Modified independent (Device/Increase time) Gait Distance (Feet): 100 Feet Assistive device: None Gait Pattern/deviations: Step-through pattern;Decreased stride length     General Gait Details: Slow and steady  pace, no gross imbalance noted  Stairs            Wheelchair Mobility    Modified Rankin (Stroke Patients Only)       Balance Overall balance assessment: No apparent balance deficits (not formally assessed)                                           Pertinent Vitals/Pain Pain Assessment: No/denies pain    Home Living Family/patient expects to be discharged to:: Private residence Living Arrangements: Alone Available Help at Discharge: Family;Available PRN/intermittently Type of Home: Apartment Home Access: Level entry     Home Layout: One level Home Equipment: None      Prior Function Level of Independence: Independent         Comments: Uses bus for transportation     Hand Dominance        Extremity/Trunk Assessment   Upper Extremity Assessment Upper Extremity Assessment: Overall WFL for tasks assessed    Lower Extremity Assessment Lower Extremity Assessment: Overall WFL for tasks assessed    Cervical / Trunk Assessment Cervical / Trunk Assessment: Normal  Communication   Communication: No difficulties  Cognition Arousal/Alertness: Awake/alert Behavior During Therapy: WFL for tasks assessed/performed Overall Cognitive Status: Within Functional Limits for tasks assessed                                        General Comments      Exercises  Assessment/Plan    PT Assessment Patent does not need any further PT services  PT Problem List         PT Treatment Interventions      PT Goals (Current goals can be found in the Care Plan section)  Acute Rehab PT Goals Patient Stated Goal: go home PT Goal Formulation: All assessment and education complete, DC therapy    Frequency     Barriers to discharge        Co-evaluation               AM-PAC PT "6 Clicks" Mobility  Outcome Measure Help needed turning from your back to your side while in a flat bed without using bedrails?: None Help  needed moving from lying on your back to sitting on the side of a flat bed without using bedrails?: None Help needed moving to and from a bed to a chair (including a wheelchair)?: None Help needed standing up from a chair using your arms (e.g., wheelchair or bedside chair)?: None Help needed to walk in hospital room?: None Help needed climbing 3-5 steps with a railing? : A Little 6 Click Score: 23    End of Session   Activity Tolerance: Patient tolerated treatment well Patient left: in chair;with call bell/phone within reach Nurse Communication: Mobility status PT Visit Diagnosis: Difficulty in walking, not elsewhere classified (R26.2)    Time: 0160-1093 PT Time Calculation (min) (ACUTE ONLY): 22 min   Charges:   PT Evaluation $PT Eval Low Complexity: 1 Low          Lillia Pauls, PT, DPT Acute Rehabilitation Services Pager [phone] Office [phone]   Norval Morton 08/09/2021, 8:38 AM

## 2021-08-09 NOTE — TOC Transition Note (Signed)
Transition of Care [facility]) - CM/SW Discharge Note   Patient Details  Name: Deborah Ho MRN: [id number] Date of Birth: Sep 20, 1951  Transition of Care [facility]) CM/SW Contact:  Bess Kinds, RN Phone Number: [phone] 08/09/2021, 1:22 PM   Clinical Narrative:     Notified by nursing of patient transitioning home, but needed transportation. Taxi voucher provided.   Final next level of care: Home/Self Care Barriers to Discharge: No Barriers Identified   Patient Goals and CMS Choice Patient states their goals for this hospitalization and ongoing recovery are:: To go home CMS Medicare.gov Compare Post Acute Care list provided to:: Patient Choice offered to / list presented to : Patient  Discharge Placement                       Discharge Plan and Services   Discharge Planning Services: CM Consult                                 Social Determinants of Health (SDOH) Interventions     Readmission Risk Interventions Readmission Risk Prevention Plan 10/21/2019 10/17/2019 04/27/2019  Transportation Screening Complete Complete Complete  PCP or Specialist Appt within 3-5 Days Not Complete - Complete  HRI or Home Care Consult Complete Complete Complete  Social Work Consult for Recovery Care Planning/Counseling Complete Complete Complete  Palliative Care Screening Not Applicable Complete Not Applicable  Medication Review Oceanographer) Complete Complete Complete  Some recent data might be hidden

## 2021-08-10 LAB — CULTURE, BLOOD (ROUTINE X 2)
Culture: NO GROWTH
Culture: NO GROWTH
Special Requests: ADEQUATE

## 2021-08-10 NOTE — Discharge Summary (Signed)
[facility] Discharge Summary   Patient: Deborah Ho IRW:[id number]  PCP: Grayce Sessions, NP  Date of admission: 08/05/2021   Date of discharge: 08/09/2021       Discharge Diagnoses:  Principal diagnosis Acute hypoxic and hypercapnic respiratory failure secondary to COVID-19 infection as well as acute on chronic systolic CHF Active Problems:   Acute respiratory failure with hypoxia (HCC)   Admitted From: home Disposition:  Home   Recommendations for Outpatient Follow-up:  PCP: Follow-up in 1 to 2 weeks. Follow up LABS/TEST: CBC and BMP   Follow-up Information     Grayce Sessions, NP. Schedule an appointment as soon as possible for a visit in 1 week(s).   Specialty: Internal Medicine Why: with CBC and BMP Contact information: [address] [place] [address] [phone]                Discharge Instructions     Ambulatory referral to Cardiology   Complete by: As directed    Diet - low sodium heart healthy   Complete by: As directed    Increase activity slowly   Complete by: As directed        Diet recommendation: Cardiac diet  Activity: The patient is advised to gradually reintroduce usual activities, as tolerated  Discharge Condition: stable  Code Status: Full code   History of present illness: As per the H and P dictated on admission, "Deborah Ho is a 70 y.o. female who has a PMH as outlined below and who has hx of medication non-compliance who presented to [facility] ED 9/20 with dyspnea and diaphoresis. [place] and became symptomatic. SpO2 was 82% on room air.    While in ED, she became altered to the point she could not protect her airway.  She was subsequently intubated.  CXR demonstrated pulmonary edema with focal opacity in right hilar region concerning for PNA vs neoplasm.  CT chest ordered and pending. She was given 80mg  Lasix.   PCCM called for admission.   Per chart review, she was last seen by cardiology  Oct 2021 and notes indicate she was fired from [facility] clinic due to non-compliance."  Hospital Course:  Summary of her active problems in the hospital is as following. 1.  Acute hypoxic and hypercapnic respiratory failure. Acute COVID-19 infection. Acute on chronic systolic CHF Echocardiogram shows ejection fraction 20 to 25%.  Unchanged from prior. Mechanical intubation and extubation on 09/20. Currently on room air. Patient was given IV Lasix. Was on hold.  Will resume torsemide on discharge. Continue aspirin, carvedilol as well as statin. Due to hypotension I will reduce the dose of the carvedilol. Not a good candidate for Entresto or Imdur given her renal function and hypotension.   2.  COVID-19 infection Not treated with COVID-19 infection. Was on dexamethasone.  We will continue. Monitor.   3.  Acute kidney injury on chronic kidney disease stage IIIa Baseline around 1.5 Currently around 1.8. Patient was on diuretics.  Currently on hold. Torsemide Will resume on discharge on lower dose.   4.  Cough. Continue therapy.   5.  Leukocytosis Due to steroids.  Monitor.   6.  Obesity Placing the patient at high risk for poor outcome. Body mass index is 38.92 kg/m.   Patient was seen by physical therapy, who recommended No therapy needed on discharge,. On the day of the discharge the patient's vitals were stable, and no other new acute medical condition were reported.  The patient was felt safe to be discharge at Home with no therapy needed on discharge.  Consultants: Primary admission with the ICU service Procedures: Intubation and extubation  DISCHARGE MEDICATION: Allergies as of 08/09/2021       Reactions   Aspirin Swelling   Chewable children's aspirin makes patients tongue and face swell   Effient [prasugrel] Swelling   Patient's tongue and face swells   Entresto [sacubitril-valsartan] Other (See Comments)   dizziness   Lactose Intolerance (gi)  Other (See Comments)   REACTION: stomach upset   Robitussin Dm [guaifenesin-dm] Swelling   Patient's tongue swells   Sulfa Antibiotics Swelling   Other    Had to replace "catgut" with clamps        Medication List     STOP taking these medications    isosorbide mononitrate 30 MG 24 hr tablet Commonly known as: IMDUR   potassium chloride SA 20 MEQ tablet Commonly known as: KLOR-CON       TAKE these medications    atorvastatin 80 MG tablet Commonly known as: LIPITOR Take 1 tablet by mouth once daily   carvedilol 3.125 MG tablet Commonly known as: COREG Take 1 tablet (3.125 mg total) by mouth 2 (two) times daily with a meal.   cetirizine 10 MG tablet Commonly known as: ZYRTEC Take 10 mg daily by mouth.   cholecalciferol 1000 units tablet Commonly known as: VITAMIN D Take 1,000 Units by mouth every 14 (fourteen) days. Twice a month   dexamethasone 6 MG tablet Commonly known as: DECADRON Take 1 tablet (6 mg total) by mouth daily for 7 days.   docusate sodium 100 MG capsule Commonly known as: COLACE Take 1 capsule (100 mg total) by mouth 2 (two) times daily as needed for mild constipation.   ferrous sulfate 325 (65 FE) MG tablet Take 1 tablet (325 mg total) by mouth 3 (three) times a week.   magnesium oxide 400 (241.3 Mg) MG tablet Commonly known as: MAG-OX Take 400 mg by mouth every 14 (fourteen) days. Twice a month   nitroGLYCERIN 0.4 MG SL tablet Commonly known as: NITROSTAT Place 1 tablet (0.4 mg total) under the tongue every 5 (five) minutes as needed for chest pain.   torsemide 20 MG tablet Commonly known as: Demadex Take 1 tablet (20 mg total) by mouth daily. What changed: See the new instructions.        Discharge Exam: Filed Weights   08/07/21 0323 08/08/21 0453 08/09/21 0500  Weight: 107 kg 105.9 kg 106.1 kg   Vitals:   08/09/21 1000 08/09/21 1100  BP:    Pulse: 78 74  Resp: 19 18  Temp:    SpO2: 95% 95%   General: Appear in mild  distress, no Rash; Oral Mucosa Clear, moist. no Abnormal Neck Mass Or lumps, Conjunctiva normal  Cardiovascular: S1 and S2 Present, no Murmur, Respiratory: good respiratory effort, Bilateral Air entry present and CTA, no Crackles, no wheezes Abdomen: Bowel Sound present, Soft and no tenderness Extremities: no Pedal edema Neurology: alert and oriented to time, place, and person affect appropriate. no new focal deficit Gait not checked due to patient safety concerns  The results of significant diagnostics from this hospitalization (including imaging, microbiology, ancillary and laboratory) are listed below for reference.    Significant Diagnostic Studies: CT CHEST ABDOMEN PELVIS W CONTRAST  Result Date: 08/05/2021 CLINICAL DATA:  Shortness of breath.  Abnormal chest x-ray EXAM: CT CHEST, ABDOMEN, AND PELVIS WITH CONTRAST TECHNIQUE: Multidetector CT imaging of  the chest, abdomen and pelvis was performed following the standard protocol during bolus administration of intravenous contrast. CONTRAST:  70mL OMNIPAQUE IOHEXOL 350 MG/ML SOLN COMPARISON:  Chest x-ray 08/05/2021.  CT abdomen pelvis 12/15/2018. FINDINGS: CT CHEST FINDINGS Cardiovascular: Heart size is mildly enlarged. No pericardial effusion. Thoracic aorta is nonaneurysmal. Atherosclerotic calcifications of the aorta and coronary arteries. Central pulmonary vasculature appears within normal limits. A right IJ central venous catheter terminates at the superior cavoatrial junction. Mediastinum/Nodes: Mildly prominent right paratracheal lymph node measuring 10 mm with preservation of the fatty hilum (series 3, image 17). No axillary or hilar lymphadenopathy. Endotracheal tube terminates approximately 1.7 cm above the carina. Trachea otherwise unremarkable. Enteric tube is present within the esophagus terminating within the gastric body. Subcentimeter nodule at the thyroid isthmus. Not clinically significant; no follow-up imaging recommended (ref: J  Am Coll Radiol. 2015 Feb;12(2): 143-50). Lungs/Pleura: Small bilateral pleural effusions, right greater than left. Patchy airspace opacities within the dependent portions of the bilateral upper lobes and lower lobes, right greater than left which may represent a combination of atelectasis and pneumonia. No discrete mass lesion identified. No pneumothorax. Musculoskeletal: No chest wall mass or suspicious bone lesions identified. CT ABDOMEN PELVIS FINDINGS Hepatobiliary: No focal liver abnormality is seen. Status post cholecystectomy. No biliary dilatation. Pancreas: Unremarkable. No pancreatic ductal dilatation or surrounding inflammatory changes. Spleen: Normal in size without focal abnormality. Adrenals/Urinary Tract: Unremarkable adrenal glands. 4.7 cm upper pole right renal cyst. 2.2 cm lower pole left renal cyst. No renal stone or hydronephrosis. Ureters are unremarkable. Urinary bladder is decompressed by Foley catheter. Stomach/Bowel: Enteric tube terminates within the gastric body. Stomach is otherwise unremarkable. No dilated loops of bowel. No focal bowel wall thickening or inflammatory changes. Vascular/Lymphatic: Scattered aortoiliac atherosclerotic calcifications without aneurysm. No abdominopelvic lymphadenopathy. Reproductive: Status post hysterectomy. No adnexal masses. Other: No free fluid. No abdominopelvic fluid collection. No pneumoperitoneum. No abdominal wall hernia. Musculoskeletal: No acute or significant osseous findings. IMPRESSION: 1. Patchy airspace opacities within the dependent portions of the bilateral upper lobes and lower lobes, right greater than left, may represent a combination of atelectasis and pneumonia. 2. Small bilateral pleural effusions, right greater than left. 3. Mildly prominent right paratracheal lymph node, likely reactive. 4. No acute abdominopelvic findings. Aortic Atherosclerosis (ICD10-I70.0). Electronically Signed   By: Duanne Guess D.O.   On: 08/05/2021 16:41    DG Chest Port 1 View  Result Date: 08/06/2021 CLINICAL DATA:  Respiratory failure in a 70 year old female. EXAM: PORTABLE CHEST 1 VIEW COMPARISON:  Comparison made with August 05, 2021. FINDINGS: RIGHT IJ central venous line terminates at the caval to atrial junction. Endotracheal tube is approximately 3 cm from the carina in the mid trachea. Gastric tube courses through in off the field of the radiograph into the upper abdomen. Cardiomediastinal contours remain enlarged. Increased opacity at the RIGHT and LEFT lung base and increased interstitial markings since the prior study. No visible pneumothorax. EKG leads project over the chest. On limited assessment no acute skeletal process. IMPRESSION: Increased opacity at the RIGHT and LEFT lung base and increased interstitial markings since the prior study. Findings may reflect worsening interstitial edema and effusions. Basilar airspace process likely increased as well. LEFT hemidiaphragm and medial RIGHT hemidiaphragm now obscured. Electronically Signed   By: Donzetta Kohut M.D.   On: 08/06/2021 07:26   DG Chest Port 1 View  Result Date: 08/05/2021 CLINICAL DATA:  Central line placement EXAM: PORTABLE CHEST 1 VIEW COMPARISON:  08/05/2021 FINDINGS: Endotracheal tube tip  is 3 cm above the carina. Orogastric or nasogastric tube enters the abdomen. New right internal jugular central line tip in the SVC just above the right atrium. No pneumothorax. Interstitial pulmonary edema persists. IMPRESSION: New right internal jugular central line tip at the SVC just above the right atrium. No pneumothorax. Persistent interstitial edema. Electronically Signed   By: Paulina Fusi M.D.   On: 08/05/2021 13:56   DG Chest Port 1 View  Result Date: 08/05/2021 CLINICAL DATA:  Shortness of breath.  Intubation EXAM: PORTABLE CHEST 1 VIEW COMPARISON:  10/11/2019 FINDINGS: Endotracheal tube terminates approximately 2.4 cm above the carina. Enteric tube courses below the  diaphragm with distal tip beyond the inferior margin of the film. Mild cardiomegaly. Atherosclerotic calcification of the aortic knob. Focal opacity in the right hilar region. Diffusely increased interstitial markings throughout both lungs with subtle interstitial opacities in the periphery of the left mid lung. No pleural effusion or pneumothorax identified. IMPRESSION: 1. Lines and tubes as described above. 2. Focal opacity in the right hilar region. This could represent pneumonia, adenopathy, or neoplasm. Recommend further evaluation with contrast-enhanced CT of the chest. 3. Diffusely increased interstitial markings throughout both lungs with subtle interstitial opacities in the periphery of the left mid lung, may reflect pulmonary edema or atypical/viral infection. Electronically Signed   By: Duanne Guess D.O.   On: 08/05/2021 11:53   ECHOCARDIOGRAM COMPLETE  Result Date: 08/05/2021    ECHOCARDIOGRAM REPORT   Patient Name:   MERILEE ARAUZA Date of Exam: 08/05/2021 Medical Rec #:  [id number]      Height:       65.0 in Accession #:    [id number]     Weight:       213.8 lb Date of Birth:  04-11-1951      BSA:          2.035 m Patient Age:    69 years       BP:           107/68 mmHg Patient Gender: F              HR:           77 bpm. Exam Location:  Inpatient Procedure: 2D Echo, Cardiac Doppler, Color Doppler and Intracardiac            Opacification Agent STAT ECHO Indications:     Dilated cardiomyopathy  History:         Patient has prior history of Echocardiogram examinations, most                  recent 11/28/2018. Previous Myocardial Infarction; Risk                  Factors:Current Smoker, Hypertension and Dyslipidemia. Ischemic                  cardiomyopathy.  Sonographer:     Ross Ludwig RDCS (AE) Referring Phys:  [id number] Lorin Glass Diagnosing Phys: Armanda Magic MD  Sonographer Comments: Technically challenging study due to limited acoustic windows, Technically difficult study due to poor echo  windows, suboptimal parasternal window, suboptimal apical window, suboptimal subcostal window, echo performed with patient supine and on artificial respirator and patient is morbidly obese. Image acquisition challenging due to patient body habitus and Image acquisition challenging due to COPD. COVID-19. Technically difficult study even with Definity usage. Patient on stretched in ED with limited ability to position. IMPRESSIONS  1. Left ventricular ejection fraction, by estimation,  is 20 to 25% without definity but may be higher. Definity images are very poor in quality. The left ventricle has severely decreased function. There is mild concentric left ventricular hypertrophy. Left ventricular diastolic function could not be evaluated.  2. Right ventricular systolic function was not well visualized. The right ventricular size is not well visualized. Tricuspid regurgitation signal is inadequate for assessing PA pressure.  3. The mitral valve was not well visualized. Mild mitral valve regurgitation. No evidence of mitral stenosis.  4. The aortic valve was not well visualized. Aortic valve regurgitation is not visualized. No aortic stenosis is present.  5. The inferior vena cava is normal in size with <50% respiratory variability, suggesting right atrial pressure of 8 mmHg. FINDINGS  Left Ventricle: Left ventricular ejection fraction, by estimation, is 20 to 25%. The left ventricle has severely decreased function. The left ventricle demonstrates global hypokinesis. Definity contrast agent was given IV to delineate the left ventricular endocardial borders. The left ventricular internal cavity size was normal in size. There is mild concentric left ventricular hypertrophy. Left ventricular diastolic function could not be evaluated. Right Ventricle: The right ventricular size is not well visualized. Right vetricular wall thickness was not assessed. Right ventricular systolic function was not well visualized. Tricuspid  regurgitation signal is inadequate for assessing PA pressure. Left Atrium: Left atrial size was not well visualized. Right Atrium: Right atrial size was not well visualized. Pericardium: There is no evidence of pericardial effusion. Mitral Valve: The mitral valve was not well visualized. Mild mitral valve regurgitation. No evidence of mitral valve stenosis. Tricuspid Valve: The tricuspid valve is not well visualized. Tricuspid valve regurgitation is not demonstrated. No evidence of tricuspid stenosis. Aortic Valve: The aortic valve was not well visualized. Aortic valve regurgitation is not visualized. No aortic stenosis is present. Aortic valve mean gradient measures 2.0 mmHg. Aortic valve peak gradient measures 4.1 mmHg. Aortic valve area, by VTI measures 1.86 cm. Pulmonic Valve: The pulmonic valve was not well visualized. Pulmonic valve regurgitation is not visualized. No evidence of pulmonic stenosis. Aorta: The aortic root is normal in size and structure. Venous: The inferior vena cava is normal in size with less than 50% respiratory variability, suggesting right atrial pressure of 8 mmHg. IAS/Shunts: No atrial level shunt detected by color flow Doppler.  LEFT VENTRICLE PLAX 2D LVIDd:         5.50 cm LVIDs:         5.10 cm LV PW:         1.30 cm LV IVS:        1.20 cm LVOT diam:     2.00 cm LV SV:         34 LV SV Index:   17 LVOT Area:     3.14 cm  RIGHT VENTRICLE            IVC RV Basal diam:  3.80 cm    IVC diam: 2.00 cm RV S prime:     9.07 cm/s LEFT ATRIUM             Index       RIGHT ATRIUM           Index LA diam:        4.10 cm 2.01 cm/m  RA Area:     20.20 cm LA Vol (A2C):   77.6 ml 38.13 ml/m RA Volume:   67.50 ml  33.17 ml/m LA Vol (A4C):   63.2 ml 31.06 ml/m LA Biplane Vol:  70.0 ml 34.40 ml/m  AORTIC VALVE AV Area (Vmax):    2.35 cm AV Area (Vmean):   2.10 cm AV Area (VTI):     1.86 cm AV Vmax:           101.00 cm/s AV Vmean:          67.500 cm/s AV VTI:            0.184 m AV Peak Grad:       4.1 mmHg AV Mean Grad:      2.0 mmHg LVOT Vmax:         75.40 cm/s LVOT Vmean:        45.200 cm/s LVOT VTI:          0.109 m LVOT/AV VTI ratio: 0.59  AORTA Ao Root diam: 3.00 cm Ao Asc diam:  3.30 cm MR Peak grad: 70.6 mmHg MR Mean grad: 44.0 mmHg   SHUNTS MR Vmax:      420.00 cm/s Systemic VTI:  0.11 m MR Vmean:     310.0 cm/s  Systemic Diam: 2.00 cm Armanda Magic MD Electronically signed by Armanda Magic MD Signature Date/Time: 08/05/2021/3:49:30 PM    Final (Updated)     Microbiology: Recent Results (from the past 240 hour(s))  Resp Panel by RT-PCR (Flu A&B, Covid) Nasopharyngeal Swab     Status: Abnormal   Collection Time: 08/05/21 11:08 AM   Specimen: Nasopharyngeal Swab; Nasopharyngeal(NP) swabs in vial transport medium  Result Value Ref Range Status   SARS Coronavirus 2 by RT PCR POSITIVE (A) NEGATIVE Final    Comment: RESULT CALLED TO, READ BACK BY AND VERIFIED WITH: RN ELILY SHEFFER [id number] AT 1430 BY CM (NOTE) SARS-CoV-2 target nucleic acids are DETECTED.  The SARS-CoV-2 RNA is generally detectable in upper respiratory specimens during the acute phase of infection. Positive results are indicative of the presence of the identified virus, but do not rule out bacterial infection or co-infection with other pathogens not detected by the test. Clinical correlation with patient history and other diagnostic information is necessary to determine patient infection status. The expected result is Negative.  Fact Sheet for Patients: BloggerCourse.com  Fact Sheet for Healthcare Providers: SeriousBroker.it  This test is not yet approved or cleared by the [place] FDA and  has been authorized for detection and/or diagnosis of SARS-CoV-2 by FDA under an Emergency Use Authorization (EUA).  This EUA will remain in effect (meaning this test ca n be used) for the duration of  the COVID-19 declaration under Section 564(b)(1) of the Act,  21 U.S.C. section 360bbb-3(b)(1), unless the authorization is terminated or revoked sooner.     Influenza A by PCR NEGATIVE NEGATIVE Final   Influenza B by PCR NEGATIVE NEGATIVE Final    Comment: (NOTE) The Xpert Xpress SARS-CoV-2/FLU/RSV plus assay is intended as an aid in the diagnosis of influenza from Nasopharyngeal swab specimens and should not be used as a sole basis for treatment. Nasal washings and aspirates are unacceptable for Xpert Xpress SARS-CoV-2/FLU/RSV testing.  Fact Sheet for Patients: BloggerCourse.com  Fact Sheet for Healthcare Providers: SeriousBroker.it  This test is not yet approved or cleared by the [place] FDA and has been authorized for detection and/or diagnosis of SARS-CoV-2 by FDA under an Emergency Use Authorization (EUA). This EUA will remain in effect (meaning this test can be used) for the duration of the COVID-19 declaration under Section 564(b)(1) of the Act, 21 U.S.C. section 360bbb-3(b)(1), unless the authorization is terminated or revoked.  Performed  at [facility], [address] [place], [place] [address]   Culture, blood (routine x 2)     Status: None   Collection Time: 08/05/21 11:44 AM   Specimen: BLOOD  Result Value Ref Range Status   Specimen Description BLOOD SITE NOT SPECIFIED  Final   Special Requests   Final    BOTTLES DRAWN AEROBIC AND ANAEROBIC Blood Culture adequate volume   Culture   Final    NO GROWTH 5 DAYS Performed at [facility], [address] [place], [place] [address]    Report Status 08/10/2021 FINAL  Final  Culture, blood (routine x 2)     Status: None   Collection Time: 08/05/21 11:50 AM   Specimen: BLOOD LEFT ARM  Result Value Ref Range Status   Specimen Description BLOOD LEFT ARM  Final   Special Requests   Final    BOTTLES DRAWN AEROBIC AND ANAEROBIC Blood Culture results may not be optimal due to an inadequate volume of blood  received in culture bottles   Culture   Final    NO GROWTH 5 DAYS Performed at [facility], [address] [place], [place] [address]    Report Status 08/10/2021 FINAL  Final  MRSA Next Gen by PCR, Nasal     Status: None   Collection Time: 08/05/21  4:43 PM   Specimen: Nasal Mucosa; Nasal Swab  Result Value Ref Range Status   MRSA by PCR Next Gen NOT DETECTED NOT DETECTED Final    Comment: (NOTE) The GeneXpert MRSA Assay (FDA approved for NASAL specimens only), is one component of a comprehensive MRSA colonization surveillance program. It is not intended to diagnose MRSA infection nor to guide or monitor treatment for MRSA infections. Test performance is not FDA approved in patients less than 38 years old. Performed at [facility], [address] [place], [place] [address]   Respiratory (~20 pathogens) panel by PCR     Status: None   Collection Time: 08/06/21  1:28 AM   Specimen: Nasopharyngeal Swab; Respiratory  Result Value Ref Range Status   Adenovirus NOT DETECTED NOT DETECTED Final   Coronavirus 229E NOT DETECTED NOT DETECTED Final    Comment: (NOTE) The Coronavirus on the Respiratory Panel, DOES NOT test for the novel  Coronavirus (2019 nCoV)    Coronavirus HKU1 NOT DETECTED NOT DETECTED Final   Coronavirus NL63 NOT DETECTED NOT DETECTED Final   Coronavirus OC43 NOT DETECTED NOT DETECTED Final   Metapneumovirus NOT DETECTED NOT DETECTED Final   Rhinovirus / Enterovirus NOT DETECTED NOT DETECTED Final   Influenza A NOT DETECTED NOT DETECTED Final   Influenza B NOT DETECTED NOT DETECTED Final   Parainfluenza Virus 1 NOT DETECTED NOT DETECTED Final   Parainfluenza Virus 2 NOT DETECTED NOT DETECTED Final   Parainfluenza Virus 3 NOT DETECTED NOT DETECTED Final   Parainfluenza Virus 4 NOT DETECTED NOT DETECTED Final   Respiratory Syncytial Virus NOT DETECTED NOT DETECTED Final   Bordetella pertussis NOT DETECTED NOT DETECTED Final   Bordetella Parapertussis  NOT DETECTED NOT DETECTED Final   Chlamydophila pneumoniae NOT DETECTED NOT DETECTED Final   Mycoplasma pneumoniae NOT DETECTED NOT DETECTED Final    Comment: Performed at [facility], [address], [place], [place] [address]     Labs: CBC: Recent Labs  Lab 08/05/21 1144 08/05/21 1149 08/05/21 1413 08/06/21 0328 08/07/21 0319 08/08/21 0222 08/09/21 0223  WBC 14.7*  --   --  11.5* 11.2* 17.0* 16.9*  NEUTROABS 9.7*  --   --   --   --  15.0* 14.7*  HGB 14.3   < > 13.6 11.8* 12.4 12.0 12.0  HCT 47.8*   < > 40.0 37.2 38.7 37.7 38.0  MCV 98.6  --   --  91.9 91.5 91.3 91.6  PLT 321  --   --  219 253 265 275   < > = values in this interval not displayed.   Basic Metabolic Panel: Recent Labs  Lab 08/05/21 1144 08/05/21 1149 08/05/21 1413 08/06/21 0328 08/07/21 0319 08/08/21 0222 08/09/21 0223  NA 139   < > 143 139 137 137 136  K 3.9   < > 4.2 3.6 4.2 4.0 4.2  CL 107  --   --  106 102 103 105  CO2 16*  --   --  24 25 24 22   GLUCOSE 234*  --   --  109* 141* 131* 129*  BUN 17  --   --  17 18 33* 36*  CREATININE 1.59*  --   --  1.47* 1.65* 1.85* 1.69*  CALCIUM 8.9  --   --  8.8* 9.0 9.0 8.7*  MG  --   --   --  2.0 2.0 2.2  --   PHOS  --   --   --  2.6 3.6 3.7  --    < > = values in this interval not displayed.   Liver Function Tests: Recent Labs  Lab 08/05/21 1144  AST 24  ALT 18  ALKPHOS 171*  BILITOT 0.7  PROT 7.0  ALBUMIN 3.4*   CBG: Recent Labs  Lab 08/06/21 1126 08/06/21 1539 08/06/21 2148 08/07/21 0759 08/07/21 1127  GLUCAP 107* 116* 137* 119* 112*    Time spent: 35 minutes  Signed:  Lynden Oxford  [facility] 08/09/2021

## 2021-08-11 ENCOUNTER — Telehealth: Payer: Self-pay

## 2021-08-11 NOTE — Telephone Encounter (Signed)
Transition Care Management Follow-up Telephone Call   Date of discharge and from where:[facility] 08/09/2021 How have you been since you were released from the hospital? Good  Any questions or concerns? No questions/concerns reported. None Items Reviewed: Did the pt receive and understand the discharge instructions provided? have the instructions and have no questions.  Medications obtained and verified? She said will have the medication picked up tomorrow. Meds list  were reviewed by hospital staff in detail prior to discharge.  Any new allergies since your discharge? None reported  Do you have support at home? Lives alone Other (ie: DME, Home Health, etc)     NA   Functional Questionnaire: (I = Independent and D = Dependent) ADL's:  Independent.        Follow up appointments reviewed:   PCP Hospital f/u appt confirmed? NP [name] on 09/01/2021  Specialist Hospital f/u appt confirmed? None scheduled at this time  Are transportation arrangements needed? have transportation   If their condition worsens, is the pt aware to call  their PCP or go to the ED? Yes.Made pt aware if condition worsen or start experiencing rapid weight gain, chest pain, diff breathing, SOB, high fevers, or bleading to refer imediately to ED for further evaluation.  Was the patient provided with contact information for the PCP's office or ED? He has the phone number  Was the pt encouraged to call back with questions or concerns?yes

## 2021-08-27 ENCOUNTER — Ambulatory Visit: Payer: Medicare PPO | Admitting: Podiatry

## 2021-09-01 ENCOUNTER — Ambulatory Visit (INDEPENDENT_AMBULATORY_CARE_PROVIDER_SITE_OTHER): Payer: Medicare PPO | Admitting: Primary Care

## 2021-09-03 ENCOUNTER — Encounter: Payer: Self-pay | Admitting: Podiatry

## 2021-09-03 ENCOUNTER — Ambulatory Visit: Payer: Medicare PPO | Admitting: Podiatry

## 2021-09-03 ENCOUNTER — Other Ambulatory Visit: Payer: Self-pay

## 2021-09-03 DIAGNOSIS — M79674 Pain in right toe(s): Secondary | ICD-10-CM

## 2021-09-03 DIAGNOSIS — B351 Tinea unguium: Secondary | ICD-10-CM | POA: Diagnosis not present

## 2021-09-03 DIAGNOSIS — M79675 Pain in left toe(s): Secondary | ICD-10-CM

## 2021-09-03 DIAGNOSIS — Q828 Other specified congenital malformations of skin: Secondary | ICD-10-CM

## 2021-09-03 NOTE — Progress Notes (Signed)
Subjective:   Patient ID: Deborah Ho, female   DOB: 70 y.o.   MRN: [id number]   HPI Patient presents with severe nail disease 1-5 both feet and lesion formation plantar aspect third metatarsal both feet painful and has thickness of the skin around her heels bilateral that aggravated   ROS      Objective:  Physical Exam  Neurovascular status intact with patient found to have nail disease and thickness of lesions bilateral that are painful and hard for her to walk with along with dry skin on the heel region bilateral     Assessment:  Chronic lesion formation digital deformities and nail disease with mycosis and pain along with dry skin     Plan:  Advice on dry skin given debrided nailbeds 1-5 both feet debrided lesions bilateral no iatrogenic bleeding reappoint routine care

## 2021-09-17 ENCOUNTER — Ambulatory Visit (INDEPENDENT_AMBULATORY_CARE_PROVIDER_SITE_OTHER): Payer: Medicare PPO | Admitting: Primary Care

## 2021-10-05 ENCOUNTER — Inpatient Hospital Stay (HOSPITAL_COMMUNITY)
Admission: EM | Admit: 2021-10-05 | Discharge: 2021-10-16 | DRG: 207 | Disposition: E | Payer: Medicare PPO | Attending: Pulmonary Disease | Admitting: Pulmonary Disease

## 2021-10-05 ENCOUNTER — Emergency Department (HOSPITAL_COMMUNITY): Payer: Medicare PPO

## 2021-10-05 ENCOUNTER — Inpatient Hospital Stay (HOSPITAL_COMMUNITY): Payer: Medicare PPO

## 2021-10-05 DIAGNOSIS — G931 Anoxic brain damage, not elsewhere classified: Secondary | ICD-10-CM | POA: Diagnosis present

## 2021-10-05 DIAGNOSIS — I469 Cardiac arrest, cause unspecified: Secondary | ICD-10-CM

## 2021-10-05 DIAGNOSIS — I2510 Atherosclerotic heart disease of native coronary artery without angina pectoris: Secondary | ICD-10-CM | POA: Diagnosis present

## 2021-10-05 DIAGNOSIS — R451 Restlessness and agitation: Secondary | ICD-10-CM | POA: Diagnosis not present

## 2021-10-05 DIAGNOSIS — Z6838 Body mass index (BMI) 38.0-38.9, adult: Secondary | ICD-10-CM | POA: Diagnosis not present

## 2021-10-05 DIAGNOSIS — Z87891 Personal history of nicotine dependence: Secondary | ICD-10-CM

## 2021-10-05 DIAGNOSIS — I959 Hypotension, unspecified: Secondary | ICD-10-CM | POA: Diagnosis present

## 2021-10-05 DIAGNOSIS — J9601 Acute respiratory failure with hypoxia: Secondary | ICD-10-CM | POA: Diagnosis present

## 2021-10-05 DIAGNOSIS — Z91011 Allergy to milk products: Secondary | ICD-10-CM

## 2021-10-05 DIAGNOSIS — Z66 Do not resuscitate: Secondary | ICD-10-CM | POA: Diagnosis present

## 2021-10-05 DIAGNOSIS — Z955 Presence of coronary angioplasty implant and graft: Secondary | ICD-10-CM

## 2021-10-05 DIAGNOSIS — R34 Anuria and oliguria: Secondary | ICD-10-CM | POA: Diagnosis not present

## 2021-10-05 DIAGNOSIS — E785 Hyperlipidemia, unspecified: Secondary | ICD-10-CM | POA: Diagnosis present

## 2021-10-05 DIAGNOSIS — Z515 Encounter for palliative care: Secondary | ICD-10-CM

## 2021-10-05 DIAGNOSIS — E559 Vitamin D deficiency, unspecified: Secondary | ICD-10-CM | POA: Diagnosis present

## 2021-10-05 DIAGNOSIS — E872 Acidosis, unspecified: Secondary | ICD-10-CM | POA: Diagnosis present

## 2021-10-05 DIAGNOSIS — Z91199 Patient's noncompliance with other medical treatment and regimen due to unspecified reason: Secondary | ICD-10-CM

## 2021-10-05 DIAGNOSIS — Z8616 Personal history of COVID-19: Secondary | ICD-10-CM

## 2021-10-05 DIAGNOSIS — R739 Hyperglycemia, unspecified: Secondary | ICD-10-CM | POA: Diagnosis present

## 2021-10-05 DIAGNOSIS — Z7189 Other specified counseling: Secondary | ICD-10-CM | POA: Diagnosis not present

## 2021-10-05 DIAGNOSIS — E669 Obesity, unspecified: Secondary | ICD-10-CM | POA: Diagnosis present

## 2021-10-05 DIAGNOSIS — N289 Disorder of kidney and ureter, unspecified: Secondary | ICD-10-CM

## 2021-10-05 DIAGNOSIS — K219 Gastro-esophageal reflux disease without esophagitis: Secondary | ICD-10-CM | POA: Diagnosis present

## 2021-10-05 DIAGNOSIS — G9340 Encephalopathy, unspecified: Secondary | ICD-10-CM | POA: Diagnosis not present

## 2021-10-05 DIAGNOSIS — R569 Unspecified convulsions: Secondary | ICD-10-CM | POA: Diagnosis not present

## 2021-10-05 DIAGNOSIS — Z888 Allergy status to other drugs, medicaments and biological substances status: Secondary | ICD-10-CM

## 2021-10-05 DIAGNOSIS — I5023 Acute on chronic systolic (congestive) heart failure: Secondary | ICD-10-CM | POA: Diagnosis present

## 2021-10-05 DIAGNOSIS — Z20822 Contact with and (suspected) exposure to covid-19: Secondary | ICD-10-CM | POA: Diagnosis present

## 2021-10-05 DIAGNOSIS — I255 Ischemic cardiomyopathy: Secondary | ICD-10-CM | POA: Diagnosis present

## 2021-10-05 DIAGNOSIS — N1832 Chronic kidney disease, stage 3b: Secondary | ICD-10-CM | POA: Diagnosis present

## 2021-10-05 DIAGNOSIS — G9341 Metabolic encephalopathy: Secondary | ICD-10-CM | POA: Diagnosis present

## 2021-10-05 DIAGNOSIS — I468 Cardiac arrest due to other underlying condition: Secondary | ICD-10-CM | POA: Diagnosis present

## 2021-10-05 DIAGNOSIS — I10 Essential (primary) hypertension: Secondary | ICD-10-CM

## 2021-10-05 DIAGNOSIS — Z79899 Other long term (current) drug therapy: Secondary | ICD-10-CM

## 2021-10-05 DIAGNOSIS — J189 Pneumonia, unspecified organism: Secondary | ICD-10-CM | POA: Diagnosis present

## 2021-10-05 DIAGNOSIS — N179 Acute kidney failure, unspecified: Secondary | ICD-10-CM | POA: Diagnosis not present

## 2021-10-05 DIAGNOSIS — Z9114 Patient's other noncompliance with medication regimen: Secondary | ICD-10-CM

## 2021-10-05 DIAGNOSIS — K7200 Acute and subacute hepatic failure without coma: Secondary | ICD-10-CM | POA: Diagnosis present

## 2021-10-05 DIAGNOSIS — I130 Hypertensive heart and chronic kidney disease with heart failure and stage 1 through stage 4 chronic kidney disease, or unspecified chronic kidney disease: Secondary | ICD-10-CM | POA: Diagnosis present

## 2021-10-05 DIAGNOSIS — Z882 Allergy status to sulfonamides status: Secondary | ICD-10-CM

## 2021-10-05 DIAGNOSIS — N170 Acute kidney failure with tubular necrosis: Secondary | ICD-10-CM | POA: Diagnosis not present

## 2021-10-05 DIAGNOSIS — J9602 Acute respiratory failure with hypercapnia: Secondary | ICD-10-CM | POA: Diagnosis present

## 2021-10-05 DIAGNOSIS — Z90711 Acquired absence of uterus with remaining cervical stump: Secondary | ICD-10-CM

## 2021-10-05 DIAGNOSIS — Z8249 Family history of ischemic heart disease and other diseases of the circulatory system: Secondary | ICD-10-CM

## 2021-10-05 DIAGNOSIS — Z833 Family history of diabetes mellitus: Secondary | ICD-10-CM

## 2021-10-05 DIAGNOSIS — Z9049 Acquired absence of other specified parts of digestive tract: Secondary | ICD-10-CM

## 2021-10-05 DIAGNOSIS — D72829 Elevated white blood cell count, unspecified: Secondary | ICD-10-CM | POA: Diagnosis present

## 2021-10-05 DIAGNOSIS — I2722 Pulmonary hypertension due to left heart disease: Secondary | ICD-10-CM | POA: Diagnosis present

## 2021-10-05 DIAGNOSIS — I252 Old myocardial infarction: Secondary | ICD-10-CM

## 2021-10-05 LAB — PROTIME-INR
INR: 1.2 (ref 0.8–1.2)
Prothrombin Time: 15.3 seconds — ABNORMAL HIGH (ref 11.4–15.2)

## 2021-10-05 LAB — RAPID URINE DRUG SCREEN, HOSP PERFORMED
Amphetamines: NOT DETECTED
Barbiturates: NOT DETECTED
Benzodiazepines: NOT DETECTED
Cocaine: NOT DETECTED
Opiates: NOT DETECTED
Tetrahydrocannabinol: NOT DETECTED

## 2021-10-05 LAB — I-STAT ARTERIAL BLOOD GAS, ED
Acid-base deficit: 13 mmol/L — ABNORMAL HIGH (ref 0.0–2.0)
Bicarbonate: 17.9 mmol/L — ABNORMAL LOW (ref 20.0–28.0)
Calcium, Ion: 1.16 mmol/L (ref 1.15–1.40)
HCT: 40 % (ref 36.0–46.0)
Hemoglobin: 13.6 g/dL (ref 12.0–15.0)
O2 Saturation: 100 %
Patient temperature: 96.6
Potassium: 4.4 mmol/L (ref 3.5–5.1)
Sodium: 137 mmol/L (ref 135–145)
TCO2: 20 mmol/L — ABNORMAL LOW (ref 22–32)
pCO2 arterial: 60.1 mmHg — ABNORMAL HIGH (ref 32.0–48.0)
pH, Arterial: 7.075 — CL (ref 7.350–7.450)
pO2, Arterial: 371 mmHg — ABNORMAL HIGH (ref 83.0–108.0)

## 2021-10-05 LAB — ECHOCARDIOGRAM COMPLETE
AR max vel: 2.45 cm2
AV Area VTI: 2.55 cm2
AV Area mean vel: 2.23 cm2
AV Mean grad: 4.9 mmHg
AV Peak grad: 10.6 mmHg
Ao pk vel: 1.63 m/s
Area-P 1/2: 4.15 cm2
Calc EF: 19.1 %
Height: 67 in
MV VTI: 2.09 cm2
S' Lateral: 5.3 cm
Single Plane A2C EF: 17.8 %
Single Plane A4C EF: 25.5 %
Weight: 3788.38 oz

## 2021-10-05 LAB — COMPREHENSIVE METABOLIC PANEL
ALT: 32 U/L (ref 0–44)
AST: 54 U/L — ABNORMAL HIGH (ref 15–41)
Albumin: 2.8 g/dL — ABNORMAL LOW (ref 3.5–5.0)
Alkaline Phosphatase: 174 U/L — ABNORMAL HIGH (ref 38–126)
Anion gap: 15 (ref 5–15)
BUN: 17 mg/dL (ref 8–23)
CO2: 14 mmol/L — ABNORMAL LOW (ref 22–32)
Calcium: 8.2 mg/dL — ABNORMAL LOW (ref 8.9–10.3)
Chloride: 107 mmol/L (ref 98–111)
Creatinine, Ser: 1.92 mg/dL — ABNORMAL HIGH (ref 0.44–1.00)
GFR, Estimated: 28 mL/min — ABNORMAL LOW (ref >60)
Glucose, Bld: 282 mg/dL — ABNORMAL HIGH (ref 70–99)
Potassium: 4.1 mmol/L (ref 3.5–5.1)
Sodium: 136 mmol/L (ref 135–145)
Total Bilirubin: 0.7 mg/dL (ref 0.3–1.2)
Total Protein: 5.8 g/dL — ABNORMAL LOW (ref 6.5–8.1)

## 2021-10-05 LAB — CBC WITH DIFFERENTIAL/PLATELET
Abs Immature Granulocytes: 0 10*3/uL (ref 0.00–0.07)
Basophils Absolute: 0 10*3/uL (ref 0.0–0.1)
Basophils Relative: 0 %
Eosinophils Absolute: 0.5 10*3/uL (ref 0.0–0.5)
Eosinophils Relative: 2 %
HCT: 46.1 % — ABNORMAL HIGH (ref 36.0–46.0)
Hemoglobin: 13.5 g/dL (ref 12.0–15.0)
Lymphocytes Relative: 26 %
Lymphs Abs: 6.9 10*3/uL — ABNORMAL HIGH (ref 0.7–4.0)
MCH: 29.3 pg (ref 26.0–34.0)
MCHC: 29.3 g/dL — ABNORMAL LOW (ref 30.0–36.0)
MCV: 100.2 fL — ABNORMAL HIGH (ref 80.0–100.0)
Monocytes Absolute: 1.1 10*3/uL — ABNORMAL HIGH (ref 0.1–1.0)
Monocytes Relative: 4 %
Neutro Abs: 18.1 10*3/uL — ABNORMAL HIGH (ref 1.7–7.7)
Neutrophils Relative %: 68 %
Platelets: 257 10*3/uL (ref 150–400)
RBC: 4.6 MIL/uL (ref 3.87–5.11)
RDW: 12.7 % (ref 11.5–15.5)
WBC: 26.6 10*3/uL — ABNORMAL HIGH (ref 4.0–10.5)
nRBC: 0 /100 WBC
nRBC: 0.1 % (ref 0.0–0.2)

## 2021-10-05 LAB — I-STAT CHEM 8, ED
BUN: 18 mg/dL (ref 8–23)
Calcium, Ion: 1.06 mmol/L — ABNORMAL LOW (ref 1.15–1.40)
Chloride: 109 mmol/L (ref 98–111)
Creatinine, Ser: 1.7 mg/dL — ABNORMAL HIGH (ref 0.44–1.00)
Glucose, Bld: 275 mg/dL — ABNORMAL HIGH (ref 70–99)
HCT: 42 % (ref 36.0–46.0)
Hemoglobin: 14.3 g/dL (ref 12.0–15.0)
Potassium: 4.1 mmol/L (ref 3.5–5.1)
Sodium: 140 mmol/L (ref 135–145)
TCO2: 18 mmol/L — ABNORMAL LOW (ref 22–32)

## 2021-10-05 LAB — BASIC METABOLIC PANEL
Anion gap: 19 — ABNORMAL HIGH (ref 5–15)
BUN: 24 mg/dL — ABNORMAL HIGH (ref 8–23)
CO2: 15 mmol/L — ABNORMAL LOW (ref 22–32)
Calcium: 8.5 mg/dL — ABNORMAL LOW (ref 8.9–10.3)
Chloride: 109 mmol/L (ref 98–111)
Creatinine, Ser: 2.13 mg/dL — ABNORMAL HIGH (ref 0.44–1.00)
GFR, Estimated: 24 mL/min — ABNORMAL LOW (ref >60)
Glucose, Bld: 192 mg/dL — ABNORMAL HIGH (ref 70–99)
Potassium: 4.2 mmol/L (ref 3.5–5.1)
Sodium: 143 mmol/L (ref 135–145)

## 2021-10-05 LAB — POCT I-STAT 7, (LYTES, BLD GAS, ICA,H+H)
Acid-base deficit: 10 mmol/L — ABNORMAL HIGH (ref 0.0–2.0)
Bicarbonate: 16.1 mmol/L — ABNORMAL LOW (ref 20.0–28.0)
Calcium, Ion: 1.11 mmol/L — ABNORMAL LOW (ref 1.15–1.40)
HCT: 44 % (ref 36.0–46.0)
Hemoglobin: 15 g/dL (ref 12.0–15.0)
O2 Saturation: 99 %
Patient temperature: 36.4
Potassium: 4.4 mmol/L (ref 3.5–5.1)
Sodium: 140 mmol/L (ref 135–145)
TCO2: 17 mmol/L — ABNORMAL LOW (ref 22–32)
pCO2 arterial: 33.3 mmHg (ref 32.0–48.0)
pH, Arterial: 7.288 — ABNORMAL LOW (ref 7.350–7.450)
pO2, Arterial: 165 mmHg — ABNORMAL HIGH (ref 83.0–108.0)

## 2021-10-05 LAB — BRAIN NATRIURETIC PEPTIDE: B Natriuretic Peptide: 396.8 pg/mL — ABNORMAL HIGH (ref 0.0–100.0)

## 2021-10-05 LAB — TROPONIN I (HIGH SENSITIVITY)
Troponin I (High Sensitivity): 63 ng/L — ABNORMAL HIGH (ref <18)
Troponin I (High Sensitivity): 237 ng/L (ref <18)

## 2021-10-05 LAB — RESP PANEL BY RT-PCR (FLU A&B, COVID) ARPGX2
Influenza A by PCR: NEGATIVE
Influenza B by PCR: NEGATIVE
SARS Coronavirus 2 by RT PCR: NEGATIVE

## 2021-10-05 LAB — APTT: aPTT: 36 seconds (ref 24–36)

## 2021-10-05 LAB — PROCALCITONIN: Procalcitonin: 0.1 ng/mL

## 2021-10-05 LAB — GLUCOSE, CAPILLARY
Glucose-Capillary: 159 mg/dL — ABNORMAL HIGH (ref 70–99)
Glucose-Capillary: 191 mg/dL — ABNORMAL HIGH (ref 70–99)
Glucose-Capillary: 256 mg/dL — ABNORMAL HIGH (ref 70–99)

## 2021-10-05 LAB — MRSA NEXT GEN BY PCR, NASAL: MRSA by PCR Next Gen: NOT DETECTED

## 2021-10-05 LAB — MAGNESIUM: Magnesium: 2.4 mg/dL (ref 1.7–2.4)

## 2021-10-05 MED ORDER — STERILE WATER FOR INJECTION IV SOLN
INTRAVENOUS | Status: AC
  Filled 2021-10-05: qty 1000

## 2021-10-05 MED ORDER — CARVEDILOL 3.125 MG PO TABS
3.1250 mg | ORAL_TABLET | Freq: Two times a day (BID) | ORAL | Status: DC
  Administered 2021-10-05 – 2021-10-06 (×3): 3.125 mg
  Filled 2021-10-05 (×3): qty 1

## 2021-10-05 MED ORDER — VANCOMYCIN HCL 2000 MG/400ML IV SOLN
2000.0000 mg | Freq: Once | INTRAVENOUS | Status: AC
  Administered 2021-10-05: 2000 mg via INTRAVENOUS
  Filled 2021-10-05: qty 400

## 2021-10-05 MED ORDER — EPINEPHRINE HCL 5 MG/250ML IV SOLN IN NS
0.5000 ug/min | INTRAVENOUS | Status: DC
  Administered 2021-10-05: 10 ug/min via INTRAVENOUS

## 2021-10-05 MED ORDER — LACTATED RINGERS IV SOLN: INTRAVENOUS | Status: DC

## 2021-10-05 MED ORDER — SODIUM CHLORIDE 0.9 % IV SOLN: INTRAVENOUS | Status: DC | PRN

## 2021-10-05 MED ORDER — SODIUM BICARBONATE 8.4 % IV SOLN
50.0000 meq | Freq: Once | INTRAVENOUS | Status: AC
  Administered 2021-10-05: 50 meq via INTRAVENOUS
  Filled 2021-10-05: qty 50

## 2021-10-05 MED ORDER — EPINEPHRINE 1 MG/10ML IJ SOSY
PREFILLED_SYRINGE | INTRAMUSCULAR | Status: AC | PRN
  Administered 2021-10-05: 1 mg via INTRAVENOUS

## 2021-10-05 MED ORDER — SODIUM CHLORIDE 0.9 % IV SOLN: INTRAVENOUS | Status: DC

## 2021-10-05 MED ORDER — ACETAMINOPHEN 325 MG PO TABS
650.0000 mg | ORAL_TABLET | ORAL | Status: DC | PRN
  Administered 2021-10-06 – 2021-10-09 (×4): 650 mg
  Filled 2021-10-05 (×4): qty 2

## 2021-10-05 MED ORDER — PANTOPRAZOLE SODIUM 40 MG IV SOLR
40.0000 mg | Freq: Two times a day (BID) | INTRAVENOUS | Status: DC
  Administered 2021-10-05 – 2021-10-06 (×4): 40 mg via INTRAVENOUS
  Filled 2021-10-05 (×4): qty 40

## 2021-10-05 MED ORDER — INSULIN ASPART 100 UNIT/ML IJ SOLN
0.0000 [IU] | INTRAMUSCULAR | Status: DC
  Administered 2021-10-05: 3 [IU] via SUBCUTANEOUS
  Administered 2021-10-05: 8 [IU] via SUBCUTANEOUS
  Administered 2021-10-06 (×4): 2 [IU] via SUBCUTANEOUS
  Administered 2021-10-06: 3 [IU] via SUBCUTANEOUS
  Administered 2021-10-06 – 2021-10-07 (×2): 2 [IU] via SUBCUTANEOUS
  Administered 2021-10-07: 3 [IU] via SUBCUTANEOUS
  Administered 2021-10-07 (×2): 2 [IU] via SUBCUTANEOUS
  Administered 2021-10-07: 3 [IU] via SUBCUTANEOUS
  Administered 2021-10-08 – 2021-10-09 (×8): 2 [IU] via SUBCUTANEOUS
  Administered 2021-10-09: 3 [IU] via SUBCUTANEOUS
  Administered 2021-10-10 (×2): 2 [IU] via SUBCUTANEOUS

## 2021-10-05 MED ORDER — VANCOMYCIN HCL 1500 MG/300ML IV SOLN: 1500.0000 mg | INTRAVENOUS | Status: DC

## 2021-10-05 MED ORDER — IOHEXOL 350 MG/ML SOLN
100.0000 mL | Freq: Once | INTRAVENOUS | Status: AC | PRN
  Administered 2021-10-05: 100 mL via INTRAVENOUS

## 2021-10-05 MED ORDER — CHLORHEXIDINE GLUCONATE CLOTH 2 % EX PADS
6.0000 | MEDICATED_PAD | Freq: Every day | CUTANEOUS | Status: DC
  Administered 2021-10-06 – 2021-10-10 (×5): 6 via TOPICAL

## 2021-10-05 MED ORDER — HEPARIN SODIUM (PORCINE) 5000 UNIT/ML IJ SOLN
5000.0000 [IU] | Freq: Three times a day (TID) | INTRAMUSCULAR | Status: DC
  Administered 2021-10-05 – 2021-10-10 (×15): 5000 [IU] via SUBCUTANEOUS
  Filled 2021-10-05 (×14): qty 1

## 2021-10-05 MED ORDER — ACETAMINOPHEN 325 MG PO TABS
650.0000 mg | ORAL_TABLET | ORAL | Status: DC | PRN
  Administered 2021-10-05: 650 mg via ORAL
  Filled 2021-10-05: qty 2

## 2021-10-05 MED ORDER — ONDANSETRON HCL 4 MG/2ML IJ SOLN: 4.0000 mg | Freq: Four times a day (QID) | INTRAMUSCULAR | Status: DC | PRN

## 2021-10-05 MED ORDER — FENTANYL 2500MCG IN NS 250ML (10MCG/ML) PREMIX INFUSION
0.0000 ug/h | INTRAVENOUS | Status: DC
  Administered 2021-10-05: 25 ug/h via INTRAVENOUS
  Filled 2021-10-05: qty 250

## 2021-10-05 MED ORDER — SODIUM CHLORIDE 0.9 % IV SOLN
2.0000 g | Freq: Two times a day (BID) | INTRAVENOUS | Status: DC
  Administered 2021-10-05 (×2): 2 g via INTRAVENOUS
  Filled 2021-10-05 (×2): qty 2

## 2021-10-05 MED ORDER — BUSPIRONE HCL 15 MG PO TABS
15.0000 mg | ORAL_TABLET | Freq: Three times a day (TID) | ORAL | Status: DC
  Administered 2021-10-06 (×4): 15 mg
  Filled 2021-10-05: qty 2
  Filled 2021-10-05 (×2): qty 1
  Filled 2021-10-05: qty 2

## 2021-10-05 MED ORDER — FUROSEMIDE 10 MG/ML IJ SOLN
40.0000 mg | Freq: Once | INTRAMUSCULAR | Status: AC
  Administered 2021-10-05: 40 mg via INTRAVENOUS
  Filled 2021-10-05: qty 4

## 2021-10-05 MED ORDER — ORAL CARE MOUTH RINSE
15.0000 mL | OROMUCOSAL | Status: DC
  Administered 2021-10-05 – 2021-10-10 (×45): 15 mL via OROMUCOSAL

## 2021-10-05 MED ORDER — PERFLUTREN LIPID MICROSPHERE
1.0000 mL | INTRAVENOUS | Status: AC | PRN
Start: 2021-10-05 — End: 2021-10-05
  Administered 2021-10-05: 2 mL via INTRAVENOUS
  Filled 2021-10-05: qty 10

## 2021-10-05 MED ORDER — CHLORHEXIDINE GLUCONATE 0.12% ORAL RINSE (MEDLINE KIT)
15.0000 mL | Freq: Two times a day (BID) | OROMUCOSAL | Status: DC
  Administered 2021-10-05 – 2021-10-09 (×11): 15 mL via OROMUCOSAL

## 2021-10-05 MED ORDER — LABETALOL HCL 5 MG/ML IV SOLN
10.0000 mg | INTRAVENOUS | Status: DC | PRN
  Administered 2021-10-05 – 2021-10-10 (×4): 10 mg via INTRAVENOUS
  Filled 2021-10-05 (×3): qty 4

## 2021-10-05 NOTE — Progress Notes (Signed)
RT NOTE: RT transported patient on ventilator to CT and back to trauma room C with no complications. Vitals are stable. RT will continue to monitor.

## 2021-10-05 NOTE — ED Provider Notes (Signed)
[facility] EMERGENCY DEPARTMENT Provider Note   CSN: [id number] Arrival date & time: 09/24/2021  [id number]     History Chief Complaint  Patient presents with   CPR    Deborah Ho is a 70 y.o. female.  The history is provided by the EMS personnel and medical records. No language interpreter was used.  Cardiac Arrest Witnessed by:  Bystander Incident location: at a bus stop. Time before BLS initiated:  3-5 minutes Condition upon EMS arrival:  Unresponsive Pulse:  Absent Initial cardiac rhythm per EMS:  PEA (fire had a shockable rhythm) Airway:  Intubation in ED Rhythm on admission to ED:  Normal sinus  LVL 5 caveat for AMS and GCS 3     Past Medical History:  Diagnosis Date   CAD (coronary artery disease) 05/03/14; 05/09/14   a. anterior STEMI with early in-stent thorombosis for missed dose of Brillinta s/p PCI with DESx 2 into LAD (04/2014)   CHF (congestive heart failure) ([place])    GERD (gastroesophageal reflux disease)    Probable   HTN (hypertension)    Hyperlipidemia    Ischemic cardiomyopathy    a. 04/2014 ECHO with EF 45-50% b.  Repeat 2D echo 08/14/14 with EF down at 15%. Life vest placed   Non compliance w medication regimen    Tobacco use     Patient Active Problem List   Diagnosis Date Noted   Acute respiratory failure with hypoxia ([place]) 08/05/2021   Palliative care by specialist    Goals of care, counseling/discussion    Acute on chronic diastolic CHF (congestive heart failure) ([place]) [id number]   Acute systolic CHF (congestive heart failure) ([place]) 10/11/2019   Vitamin D deficiency 09/07/2019   Obesity with body mass index 30 or greater 09/07/2019   Abdominal pain 12/16/2018   Acute CHF (congestive heart failure) ([place]) 12/15/2018   Acute exacerbation of CHF (congestive heart failure) ([place]) 11/28/2018   Acute on chronic systolic CHF (congestive heart failure) ([place]) 11/27/2018   Acute on chronic combined systolic and diastolic CHF  (congestive heart failure) ([place]) 04/02/2017   Noncompliance with medication regimen 04/02/2017   Intractable nausea and vomiting 02/18/2017   Hyperlipidemia    GERD (gastroesophageal reflux disease)    CHF (congestive heart failure) ([place])    Cellulitis of left lower extremity    Cough productive of clear sputum    Cellulitis of left lower leg    Hypokalemia 01/28/2017   Peripheral edema    Abdominal distension 06/26/2016   Anasarca [id number]   Chronic systolic heart failure ([place]) 02/20/2016   CKD (chronic kidney disease), stage III (HCC)    Adjustment disorder with mixed anxiety and depressed mood 02/28/2015   Pleural effusion on right    Essential hypertension    Angioedema of lips 08/17/2014   Ischemic cardiomyopathy 08/13/2014   Coronary atherosclerosis of native coronary artery 05/10/2014   History of ST elevation myocardial infarction (STEMI) 05/09/2014   HLD (hyperlipidemia) 05/06/2014   Tobacco abuse 05/06/2014   Cardiomyopathy, ischemic 05/06/2014   Elevated LFTs 05/06/2014    Past Surgical History:  Procedure Laterality Date   CHOLECYSTECTOMY     CORONARY ANGIOPLASTY WITH STENT PLACEMENT  05/03/14   STEMI- stent to LAD DES- Xience alpine   CORONARY ANGIOPLASTY WITH STENT PLACEMENT  05/09/14   STEMI- overlapping stent to LAD, pt had missed a dose of Brilinta   LEFT HEART CATHETERIZATION WITH CORONARY ANGIOGRAM N/A 05/03/2014   Procedure: LEFT HEART CATHETERIZATION WITH CORONARY ANGIOGRAM;  Surgeon:  Peter M Martinique, MD;  Location[facility] C[facility] LAB;  Service: Cardiovascular;  Laterality: N/A;   LEFT HEART CATHETERIZATION WITH CORONARY ANGIOGRAM N/A 05/09/2014   Procedure: LEFT HEART CATHETERIZATION WITH CORONARY ANGIOGRAM;  Surgeon: [name], MD;  Location[facility] C[facility] LAB;  Service: Cardiovascular;  Laterality: N/A;   PARTIAL HYSTERECTOMY     PERCUTANEOUS STENT INTERVENTION  05/03/2014   Procedure: PERCUTANEOUS STENT INTERVENTION;  Surgeon: Peter M Martinique, MD;  Location:  [facility] CATH LAB;  Service: Cardiovascular;;  DES Prox LAD      OB History   No obstetric history on file.     Family History  Problem Relation Age of Onset   Diabetes Mother    Hypertension Mother     Social History   Tobacco Use   Smoking status: Former    Packs/day: 0.10    Years: 0.50    Pack years: 0.05    Types: Cigarettes   Smokeless tobacco: Former   Tobacco comments:    down to 3 cigarettes daily (08/03/14)  Vaping Use   Vaping Use: Never used  Substance Use Topics   Alcohol use: No   Drug use: No    Home Medications Prior to Admission medications   Medication Sig Start Date End Date Taking? Authorizing Provider  atorvastatin (LIPITOR) 80 MG tablet Take 1 tablet by mouth once daily Patient taking differently: Take 80 mg by mouth daily. 06/26/21   Martinique, Peter M, MD  carvedilol (COREG) 3.125 MG tablet Take 1 tablet (3.125 mg total) by mouth 2 (two) times daily with a meal. 08/09/21   Lavina Hamman, MD  cetirizine (ZYRTEC) 10 MG tablet Take 10 mg daily by mouth.     [provider]  cholecalciferol (VITAMIN D) 1000 units tablet Take 1,000 Units by mouth every 14 (fourteen) days. Twice a month    [provider]  docusate sodium (COLACE) 100 MG capsule Take 1 capsule (100 mg total) by mouth 2 (two) times daily as needed for mild constipation. 08/09/21   Lavina Hamman, MD  ferrous sulfate 325 (65 FE) MG tablet Take 1 tablet (325 mg total) by mouth 3 (three) times a week. 10/23/19 08/29/20  Swayze, Ava, DO  magnesium oxide (MAG-OX) 400 (241.3 Mg) MG tablet Take 400 mg by mouth every 14 (fourteen) days. Twice a month    [provider]  nitroGLYCERIN (NITROSTAT) 0.4 MG SL tablet Place 1 tablet (0.4 mg total) under the tongue every 5 (five) minutes as needed for chest pain. 08/29/20   Martinique, Peter M, MD  torsemide (DEMADEX) 20 MG tablet Take 1 tablet (20 mg total) by mouth daily. 08/09/21   Lavina Hamman, MD    Allergies    Aspirin, Effient  [prasugrel], Entresto [sacubitril-valsartan], Lactose intolerance (gi), Robitussin dm [guaifenesin-dm], Sulfa antibiotics, and Other  Review of Systems   Review of Systems  Unable to perform ROS: Intubated   Physical Exam Updated Vital Signs BP (!) 174/82   Pulse 87   Temp (!) 96.6 F (35.9 C)   Resp (!) 9   Ht 5\' 7"  (1.702 m)   Wt 110.3 kg   SpO2 99%   BMI 38.09 kg/m   Physical Exam Vitals and nursing note reviewed.  Constitutional:      General: She is in acute distress.     Appearance: She is obese.     Comments: King airway in place on arrival.  Unresponsive  HENT:     Head: No laceration.  Comments: Pupils are 5 mm and sluggish bilaterally no evidence of acute trauma Cardiovascular:     Heart sounds: No murmur heard.    Comments: Patient has Samuel Bouche in place getting compressions during transport to the emergency department.  Patient in PEA arrest on arrival Pulmonary:     Effort: Respiratory distress present.     Breath sounds: No rhonchi.  Chest:     Chest wall: No tenderness.  Abdominal:     General: Abdomen is flat.     Palpations: Abdomen is soft.     Tenderness: There is no abdominal tenderness. There is no guarding or rebound.  Musculoskeletal:        General: No tenderness or signs of injury.     Cervical back: Neck supple.  Skin:    General: Skin is dry.     Findings: No erythema.  Neurological:     Mental Status: She is unresponsive.     GCS: GCS eye subscore is 1. GCS verbal subscore is 1. GCS motor subscore is 1.    ED Results / Procedures / Treatments   Labs (all labs ordered are listed, but only abnormal results are displayed) Labs Reviewed  CBC WITH DIFFERENTIAL/PLATELET - Abnormal; Notable for the following components:      Result Value   WBC 26.6 (*)    HCT 46.1 (*)    MCV 100.2 (*)    MCHC 29.3 (*)    Neutro Abs 18.1 (*)    Lymphs Abs 6.9 (*)    Monocytes Absolute 1.1 (*)    All other components within normal limits   PROTIME-INR - Abnormal; Notable for the following components:   Prothrombin Time 15.3 (*)    All other components within normal limits  COMPREHENSIVE METABOLIC PANEL - Abnormal; Notable for the following components:   CO2 14 (*)    Glucose, Bld 282 (*)    Creatinine, Ser 1.92 (*)    Calcium 8.2 (*)    Total Protein 5.8 (*)    Albumin 2.8 (*)    AST 54 (*)    Alkaline Phosphatase 174 (*)    GFR, Estimated 28 (*)    All other components within normal limits  BRAIN NATRIURETIC PEPTIDE - Abnormal; Notable for the following components:   B Natriuretic Peptide 396.8 (*)    All other components within normal limits  I-STAT CHEM 8, ED - Abnormal; Notable for the following components:   Creatinine, Ser 1.70 (*)    Glucose, Bld 275 (*)    Calcium, Ion 1.06 (*)    TCO2 18 (*)    All other components within normal limits  TROPONIN I (HIGH SENSITIVITY) - Abnormal; Notable for the following components:   Troponin I (High Sensitivity) 63 (*)    All other components within normal limits  RESP PANEL BY RT-PCR (FLU A&B, COVID) ARPGX2  APTT  MAGNESIUM  I-STAT ARTERIAL BLOOD GAS, ED    EKG EKG Interpretation  Date/Time:  Sunday October 05 2021 09:57:40 EST Ventricular Rate:  77 PR Interval:  183 QRS Duration: 99 QT Interval:  461 QTC Calculation: 522 R Axis:   -68 Text Interpretation: Sinus rhythm Probable left atrial enlargement Inferior infarct, old Probable anterior infarct, age indeterminate Prolonged QT interval When compared to prior, less PVC. No STEMI Confirmed by Theda Belfast ([id number]) on 09/21/2021 10:34:54 AM  Radiology CT HEAD WO CONTRAST ( )  Result Date: 09/21/2021 CLINICAL DATA:  Altered mental status EXAM: CT HEAD WITHOUT CONTRAST TECHNIQUE: Contiguous axial images  were obtained from the base of the skull through the vertex without intravenous contrast. COMPARISON:  MR brain done on 12/15/2018 FINDINGS: Brain: There are no signs of bleeding within the cranium.  Ventricles are not dilated. There is no shift of midline structures. Cortical sulci are prominent. There is decreased density in the left posterior parietal cortex. This finding was not evident in the previous MR brain done on 12/15/2018. There is decreased density in the periventricular and subcortical white matter in both cerebral hemispheres. Vascular: Unremarkable. Skull: Unremarkable. Sinuses/Orbits: There is mild mucosal thickening in the ethmoid sinus. Other: None IMPRESSION: There are no signs of bleeding within the cranium. There is no focal mass effect. There is new area of low-density in the left posterior parietal cortex suggesting age indeterminate infarction. If clinically warranted, follow-up MRI may be considered. Atrophy. Small-vessel disease. Electronically Signed   By: Elmer Picker M.D.   On: 09/17/2021 11:25   CT Angio Chest Pulmonary Embolism (PE) W or WO Contrast  Result Date: 09/21/2021 CLINICAL DATA:  Found unresponsive, difficulty breathing EXAM: CT ANGIOGRAPHY CHEST WITH CONTRAST TECHNIQUE: Multidetector CT imaging of the chest was performed using the standard protocol during bolus administration of intravenous contrast. Multiplanar CT image reconstructions and MIPs were obtained to evaluate the vascular anatomy. CONTRAST:  183mL OMNIPAQUE IOHEXOL 350 MG/ML SOLN COMPARISON:  08/05/2021 FINDINGS: Cardiovascular: Heart is enlarged in size. Coronary artery calcifications are seen. Contrast density in the thoracic aorta is less than adequate to evaluate the lumen. There are no intraluminal filling defects in the central pulmonary artery branches. Evaluation of small peripheral subsegmental branches is limited by infiltrates and motion artifacts. Mediastinum/Nodes: No significant interval changes are noted in the slightly enlarged lymph nodes. Tip endotracheal tube is proximally 2.2 cm above the carina. Enteric tube is noted traversing the esophagus. Lungs/Pleura: There are patchy  alveolar infiltrates in both lower lobes with some interval worsening. There are patchy ground-glass and alveolar infiltrates in both upper and both mid lung fields with interval worsening. There are patchy alveolar infiltrates in the posterior segment in both upper lobes with improvement in the left upper lobe and worsening in the right upper lobe. There are no cavitary lesions in the lung fields. There is interval decrease in AP diameter of mainstem bronchi. Small right pleural effusion is seen. There is possible minimal left pleural effusion. There is no pneumothorax. Upper Abdomen: There is possible cyst in the lateral margin of right kidney. Tip enteric tube is seen in the antrum of the stomach. Musculoskeletal: Unremarkable Review of the MIP images confirms the above findings. IMPRESSION: There is no evidence of central pulmonary artery embolism. Evaluation of small peripheral subsegmental branches is limited by infiltrates and motion artifacts. There are ground-glass and alveolar infiltrates in both lungs with overall worsening. Findings suggest bilateral pneumonia. Small pleural effusions, more so on the right side. Coronary artery calcifications are seen. Possible right renal cysts. Electronically Signed   By: Elmer Picker M.D.   On: 09/27/2021 11:38   DG Chest Portable 1 View  Result Date: 10/12/2021 CLINICAL DATA:  Tube placement. EXAM: PORTABLE CHEST 1 VIEW COMPARISON:  12/05/2020 FINDINGS: Endotracheal tube is present with tip 3.2 cm above the carina. Lungs are adequately inflated with hazy bilateral perihilar opacification suggesting mild interstitial edema. No evidence of effusion. Mild stable cardiomegaly. Remainder the exam is unchanged. IMPRESSION: 1. Hazy bilateral perihilar opacification suggesting mild interstitial edema. 2. Endotracheal tube with tip 3.2 cm above the carina. Electronically Signed   By:  Marin Olp M.D.   On: 09/22/2021 10:22    Procedures Procedure Name:  Intubation Date/Time: 10/14/2021 12:01 PM Performed by: Courtney Paris, MD Pre-anesthesia Checklist: Patient identified, Suction available and Emergency Drugs available Oxygen Delivery Method: Ambu bag Preoxygenation: Pre-oxygenation with 100% oxygen Laryngoscope Size: Glidescope Grade View: Grade I Tube size: 7.5 mm Number of attempts: 1 Secured at: 23 cm Tube secured with: ETT holder Dental Injury: Teeth and Oropharynx as per pre-operative assessment  Comments: After intubation, initial x-ray showed tube appeared deep.  It was retracted a centimeter and appeared improved.     CRITICAL CARE Performed by: Gwenyth Allegra Carrell [place] Total critical care time: 40 minutes Critical care time was exclusive of separately billable procedures and treating other patients. Critical care was necessary to treat or prevent imminent or life-threatening deterioration. Critical care was time spent personally by me on the following activities: development of treatment plan with patient and/or surrogate as well as nursing, discussions with consultants, evaluation of patient's response to treatment, examination of patient, obtaining history from patient or surrogate, ordering and performing treatments and interventions, ordering and review of laboratory studies, ordering and review of radiographic studies, pulse oximetry and re-evaluation of patient's condition.   Medications Ordered in ED Medications  EPINEPHrine (ADRENALIN) 5 mg in NS 250 mL (0.02 mg/mL) premix infusion (6 mcg/min Intravenous Rate/Dose Change 09/27/2021 1032)  0.9 %  sodium chloride infusion ( Intravenous New Bag/Given 09/27/2021 1027)  EPINEPHrine (ADRENALIN) 1 MG/10ML injection (1 mg Intravenous Given 09/18/2021 0951)  iohexol (OMNIPAQUE) 350 MG/ML injection 100 mL (100 mLs Intravenous Contrast Given 10/12/2021 1115)    ED Course  I have reviewed the triage vital signs and the nursing notes.  Pertinent labs & imaging results that were  available during my care of the patient were reviewed by me and considered in my medical decision making (see chart for details).    MDM Rules/Calculators/A&P                           Deborah Ho is a 70 y.o. female with a past medical history significant for hypertension, hyperlipidemia, CHF, CKD, GERD, and previous cholecystectomy who presents in cardiac arrest.  According to EMS, patient was standing at the bus stop just down the road when she had a witnessed cardiac arrest.  Per EMS report from bystanders, patient says she "was not feeling so good" before collapsing.  Within 5 minutes, first responders arrived and found her to be in cardiac arrest.  The AED did defibrillate 1 time but it is unknown what rhythm it discovered.  By the time EMS arrived, was receiving CPR and never had another shockable rhythm.  EMS reports they obtained ROSC but lost pulses several times in route and even had a brief episode where they did some cardiac pacing.  Total CPR time was thought to be around 30 to 40 minutes however patient remained having a GCS of 3 during transport.  A King airway was in place on arrival but the Linton Rump was still compressing on arrival.  On arrival to the room, patient is still in cardiac arrest.  We transitioned her to our equipment and on pulse check she was still pulseless.  Epi was given.  I intubated and the [facility] airway was removed and shortly thereafter ROSC was again obtained.  On my exam, breath sounds were symmetric after intubation but pupils were about 5 mm and sluggish.  GCS was 3.  Otherwise  no evidence of acute trauma seen.  Unclear history leading up to today and no family has been at the bedside thus far.  Critical care quickly called who assumed care.  They request a head CT which was ordered.  Patient admitted to critical care for further postarrest management.   Final Clinical Impression(s) / ED Diagnoses Final diagnoses:  Cardiac arrest ([place])    Clinical  Impression: 1. Cardiac arrest [facility])     Disposition: Admit  This note was prepared with assistance of Dragon voice recognition software. Occasional wrong-word or sound-a-like substitutions may have occurred due to the inherent limitations of voice recognition software.     Suellen Durocher, Gwenyth Allegra, MD 10/09/2021 [phone]

## 2021-10-05 NOTE — Progress Notes (Signed)
  Echocardiogram 2D Echocardiogram has been performed.  Deborah Ho 10/15/2021, 3:54 PM

## 2021-10-05 NOTE — Progress Notes (Signed)
eLink Physician-Brief Progress Note Patient Name: Deborah Ho DOB: 09/24/1951 MRN: [id number]   Date of Service  09/24/2021  HPI/Events of Note  Patient on normothermia TTM protocol. No sedation. Very asynchronous and tachypnic.   eICU Interventions  Plan: Fentanyl IV infusion. Titrate to RASS = 0 to -1.     Intervention Category Major Interventions: Delirium, psychosis, severe agitation - evaluation and management  Tareek Sabo [place] 10/09/2021, 8:26 PM

## 2021-10-05 NOTE — Procedures (Signed)
Arterial Catheter Insertion Procedure Note  Deborah Ho  [id number]  07/18/1951  Date:10/16/21  Time:2:03 PM    Provider Performing: Dewain Penning T    Procedure: Insertion of Arterial Line (49201) without US guidance  Indication(s) Blood pressure monitoring and/or need for frequent ABGs  Consent Unable to obtain consent due to emergent nature of procedure.  Anesthesia None   Time Out Verified patient identification, verified procedure, site/side was marked, verified correct patient position, special equipment/implants available, medications/allergies/relevant history reviewed, required imaging and test results available.   Sterile Technique Maximal sterile technique including full sterile barrier drape, hand hygiene, sterile gown, sterile gloves, mask, hair covering, sterile ultrasound probe cover (if used).   Procedure Description Area of catheter insertion was cleaned with chlorhexidine and draped in sterile fashion. Without real-time ultrasound guidance an arterial catheter was placed into the right radial artery.  Appropriate arterial tracings confirmed on monitor.     Complications/Tolerance None; patient tolerated the procedure well.   EBL Minimal   Specimen(s) None

## 2021-10-05 NOTE — ED Triage Notes (Addendum)
Patient arrived by [facility] following witnessed cardiac arrest at bus stop. Fire arrived in 4 minutes and started CPR and administered 1 shock-unknown rhythm.  Patient received the prior treatment prior to arrival: [facility] airway IO right humerus Defib x 1 fire CBG 114 EPI x 6 PEA  EMS reports ROSC x 2 prior to arrival in short time frame. Arrived with CPR in progress,

## 2021-10-05 NOTE — Progress Notes (Signed)
Pharmacy Antibiotic Note  Deborah Ho is a 70 y.o. female admitted on October 07, 2021 with s/p cardiac arrest. Pt recently admitted with respiratory failure. Pharmacy asked to dose vancomycin and cefepime for possible PNA. Pt has CKD III, Cr slightly above BL.  Plan: Vancomycin 2000mg  IV x1 then 1500mg  IV q48h - est AUC 534 Cefepime 2g IV q12h Follow Cr, cultures May need vancomycin level prior to redosing   Height: 5\' 7"  (170.2 cm) Weight: 110.3 kg (243 lb 2.7 oz) IBW/kg (Calculated) : 61.6  Temp (24hrs), Avg:95.6 F (35.3 C), Min:93.2 F (34 C), Max:96.7 F (35.9 C)  Recent Labs  Lab October 07, 2021 1008 07-Oct-2021 1013  WBC 26.6*  --   CREATININE 1.92* 1.70*     Estimated Creatinine Clearance: 39.4 mL/min (A) (by C-G formula based on SCr of 1.7 mg/dL (H)).    Allergies  Allergen Reactions   Aspirin Swelling    Chewable children's aspirin makes patients tongue and face swell   Effient [Prasugrel] Swelling    Patient's tongue and face swells   Entresto [Sacubitril-Valsartan] Other (See Comments)    dizziness   Lactose Intolerance (Gi) Other (See Comments)    REACTION: stomach upset   Robitussin Dm [Guaifenesin-Dm] Swelling    Patient's tongue swells   Sulfa Antibiotics Swelling   Other     Had to replace "catgut" with clamps    Antimicrobials this admission: Vancomycin 11/20 >>  Cefepime 11/20 >>  Microbiology results: Pending  Thank you for allowing pharmacy to be a part of this patient's care.  10/07/21, PharmD, BCPS, [facility] Clinical Pharmacist [phone] Please check AMION for all [facility] Pharmacy numbers 10/07/2021

## 2021-10-05 NOTE — Progress Notes (Signed)
Pt transported from ER to 2H 13 on full vent support. No complications noted.

## 2021-10-05 NOTE — Progress Notes (Signed)
Tried to reach pt brother at number listed in chart, no answer    Tessie Fass MSN, AGACNP-BC [facility] Pulmonary/Critical Care Medicine 09/22/2021, 11:46 AM

## 2021-10-05 NOTE — Progress Notes (Signed)
eLink Physician-Brief Progress Note Patient Name: Deborah Ho DOB: Sep 02, 1951 MRN: [id number]   Date of Service  09/17/2021  HPI/Events of Note  Patient on normothermic TTM without protocol orders. Nursing request for scheduled Tylenol and Buspar. Patient has allergy to Tylenol according to Epic and AST is mildly elevated. Therefore, will avoid Tylenol.  eICU Interventions  Plan: Buspar 15 mg per tube Q 8 hours.      Intervention Category Major Interventions: Other:  Lenell Antu 10/01/2021, 11:02 PM

## 2021-10-05 NOTE — Progress Notes (Signed)
ABG noted  Will start on bicarb

## 2021-10-05 NOTE — H&P (Addendum)
NAME:  Deborah Ho, MRN:  [id number], DOB:  11/28/1950, LOS: 0 ADMISSION DATE:  10/25/21, CONSULTATION DATE:  Oct 25, 2021 REFERRING MD:  Tegeler, CHIEF COMPLAINT:  cardiac arrest    History of Present Illness:  History obtained from chart review and care management team.   70 yo F PMH ICM Chronic systolic HF CKD3b HTN covid infection morbid obesity who was at a bus stop complaining of not feeling well when she collapsed, witnessed. EMS called, until arrival. On arrival was pulseless and received DF x1 though initial rhythm is not known, followed by further ACLS for about 30-40 minutes on and off -- PEA throughout. Arrived to ED on [facility] device, pulseless, and achieved ROSC when intubated by EDP. The patient has received no sedation or RSI medications.   At time of consultation, limited labs and imaging are available ECG reviewed which does not show findings concerning for STEMI   Na 136 K 4.1 CO@ 13 Cr 1.92  ALP 174 AST 54 ALT 32 INR 1.2  BNP 396 Trop I  63 WBC 26 hgb 13 plt 257 Albumin 2.8  ABG 7.0/63/376  Patient was notably recently admitted (07/2021) with hypoxic respiratory failure with pulmonary edema requiring mechanical ventilation due to acute exacerbation of systolic HF. At that time, EF 20-25%)   Pertinent  Medical History  HTN HLD Morbid obesity ICM CKD 3b   Significant Hospital Events: Including procedures, antibiotic start and stop dates in addition to other pertinent events   11/20 presented to ED actively receiving CPR after witnessed collapse at bus stop. Downtime 30 min, did briefly achieve ROSC a few times in field.   Interim History / Subjective:  Weaned off of epi gtt Blood from OGT Frothy pink sputum   Objective   Blood pressure (!) 161/84, pulse 76, temperature (!) 94 F (34.4 C), resp. rate 10, height 5\' 7"  (1.702 m), weight 110.3 kg, SpO2 100 %.    Vent Mode: PRVC FiO2 (%):  [100 %] 100 % Set Rate:  [18 bmp] 18 bmp Vt Set:  [490 mL] 490  mL PEEP:  [5 cmH20] 5 cmH20 Plateau Pressure:  [27 cmH20] 27 cmH20  No intake or output data in the 24 hours ending 25-Oct-2021 1104 Filed Weights   October 25, 2021 1044  Weight: 110.3 kg    Examination: General: Chronically and critically ill appearing older adult F intubated not sedated  HENT: NCAT ETT secure anicteric sclera Lungs: Scattered coarse rhonchi and crackles  Cardiovascular: rrr s1s2 cap refill < 3 seconds  Abdomen: Obese protuberant soft. Distant bowel sounds  Extremities: no acute joint deformity no cyanosis or clubbing  Neuro: GCS 3t Fixed pupils 38mm. no corneal reflex Over breathing vent. No response to pain  GU: foley with clear nearly colorless urine   Resolved Hospital Problem list     Assessment & Plan:   Acute metabolic encephalopathy  In setting of cardiac arrest, acidosis P -will maintain off sedation for now -correcting acidosis, follow neuro exam -consider eeg if no improvement in exam, MRI (with length of downtime, risk for anoxic encephalopathy)   Acute respiratory failure with hypoxia and hypercarbia Pulmonary edema CAP, possible aspiration P -RR incr of vent based on gas -cont full MV support -VAP, pulm hygiene -tracheal aspirate, extended RVP -repeat ABG this afternoon  -diurese, start empiric abx   Cardiac arrest  -initial rhythm not known, did get DF x1 with Fire but was PEA for all efforts with EMS and ED. Down for 30-40 min total,  brief periods of ROSC -hard to know etiology. Pt articulated "not feeling good" at a bus stopped, witnessed collapse. There is no hx about how patient has been feeling prior to this incident, no collateral information.  P -CT H -check CTA chest r/o PE -repeat ECHO -normothermia -supportive care  -follow admitting labs as they result   Acute on chronic HFrEF ICM HTN P -ECHO as above -giving 40mg  Lasix in ED will eval response and likely diurese further  -recently weaned off epi, hold on starting  antihypertensives for now   AKI on CKD3 P -trend renal indices   Possible UGIB -bloody fluid from OGT. Could be GIB or trauma related in setting of prolonged resuscitation -hgb WNL  P -OGT to LIMWS for now -BID PPI -monitor outpt, trend H/H -holding EN initiation  for now   Leukocytosis -Infection vs reactive -looks like was discharged 07/2021 with WBC 17 or so, here now with WBC 26. CXR looks c/f PNA. No real history on how pt has been feeling other than at bus stop "not feeling good." P -Bcx UA, Tracheal aspirate -empiric abx -- vanc cefepime -PCT   Hyperglycemia P -SSI   Elevated LFTs -seems like this has been a chronic issue in review of records P -trend LFTs in post- arrest period -- high risk for acute liver injury   Best Practice (right click and "Reselect all SmartList Selections" daily)   Diet/type: NPO DVT prophylaxis: prophylactic heparin  GI prophylaxis: PPI Lines: N/A Foley:  Yes, and it is still needed Code Status:  full code Last date of multidisciplinary goals of care discussion [pending]  Labs   CBC: Recent Labs  Lab Oct 24, 2021 1008 24-Oct-2021 1013  WBC 26.6*  --   NEUTROABS 18.1*  --   HGB 13.5 14.3  HCT 46.1* 42.0  MCV 100.2*  --   PLT 257  --     Basic Metabolic Panel: Recent Labs  Lab 10/24/21 1008 10-24-2021 1013  NA 136 140  K 4.1 4.1  CL 107 109  CO2 14*  --   GLUCOSE 282* 275*  BUN 17 18  CREATININE 1.92* 1.70*  CALCIUM 8.2*  --   MG 2.4  --    GFR: Estimated Creatinine Clearance: 39.4 mL/min (A) (by C-G formula based on SCr of 1.7 mg/dL (H)). Recent Labs  Lab 2021-10-24 1008  WBC 26.6*    Liver Function Tests: Recent Labs  Lab 2021/10/24 1008  AST 54*  ALT 32  ALKPHOS 174*  BILITOT 0.7  PROT 5.8*  ALBUMIN 2.8*   No results for input(s): LIPASE, AMYLASE in the last 168 hours. No results for input(s): AMMONIA in the last 168 hours.  ABG    Component Value Date/Time   PHART 7.315 (L) 08/05/2021 1413   PCO2ART  44.0 08/05/2021 1413   PO2ART 78 (L) 08/05/2021 1413   HCO3 22.4 08/05/2021 1413   TCO2 18 (L) 10/24/21 1013   ACIDBASEDEF 4.0 (H) 08/05/2021 1413   O2SAT 94.0 08/05/2021 1413     Coagulation Profile: Recent Labs  Lab October 24, 2021 1008  INR 1.2    Cardiac Enzymes: No results for input(s): CKTOTAL, CKMB, CKMBINDEX, TROPONINI in the last 168 hours.  HbA1C: Hgb A1c MFr Bld  Date/Time Value Ref Range Status  08/06/2021 01:59 PM 5.8 (H) 4.8 - 5.6 % Final    Comment:    (NOTE)         Prediabetes: 5.7 - 6.4         Diabetes: >  6.4         Glycemic control for adults with diabetes: <7.0   09/29/2017 04:15 AM 6.2 (H) 4.8 - 5.6 % Final    Comment:    (NOTE) Pre diabetes:          5.7%-6.4% Diabetes:              >6.4% Glycemic control for   <7.0% adults with diabetes     CBG: No results for input(s): GLUCAP in the last 168 hours.  Review of Systems:   Unable to obtain intubated and unresponsive   Past Medical History:  She,  has a past medical history of CAD (coronary artery disease) (05/03/14; 05/09/14), CHF (congestive heart failure) (HCC), GERD (gastroesophageal reflux disease), HTN (hypertension), Hyperlipidemia, Ischemic cardiomyopathy, Non compliance w medication regimen, and Tobacco use.   Surgical History:   Past Surgical History:  Procedure Laterality Date   CHOLECYSTECTOMY     CORONARY ANGIOPLASTY WITH STENT PLACEMENT  05/03/14   STEMI- stent to LAD DES- Xience alpine   CORONARY ANGIOPLASTY WITH STENT PLACEMENT  05/09/14   STEMI- overlapping stent to LAD, pt had missed a dose of Brilinta   LEFT HEART CATHETERIZATION WITH CORONARY ANGIOGRAM N/A 05/03/2014   Procedure: LEFT HEART CATHETERIZATION WITH CORONARY ANGIOGRAM;  Surgeon: Peter M Swaziland, MD;  Location: [facility] CATH LAB;  Service: Cardiovascular;  Laterality: N/A;   LEFT HEART CATHETERIZATION WITH CORONARY ANGIOGRAM N/A 05/09/2014   Procedure: LEFT HEART CATHETERIZATION WITH CORONARY ANGIOGRAM;  Surgeon: Corky Crafts, MD;  Location: [facility] CATH LAB;  Service: Cardiovascular;  Laterality: N/A;   PARTIAL HYSTERECTOMY     PERCUTANEOUS STENT INTERVENTION  05/03/2014   Procedure: PERCUTANEOUS STENT INTERVENTION;  Surgeon: Peter M Swaziland, MD;  Location: [facility] CATH LAB;  Service: Cardiovascular;;  DES Prox LAD      Social History:   reports that she has quit smoking. Her smoking use included cigarettes. She has a 0.05 pack-year smoking history. She has quit using smokeless tobacco. She reports that she does not drink alcohol and does not use drugs.   Family History:  Her family history includes Diabetes in her mother; Hypertension in her mother.   Allergies Allergies  Allergen Reactions   Aspirin Swelling    Chewable children's aspirin makes patients tongue and face swell   Effient [Prasugrel] Swelling    Patient's tongue and face swells   Entresto [Sacubitril-Valsartan] Other (See Comments)    dizziness   Lactose Intolerance (Gi) Other (See Comments)    REACTION: stomach upset   Robitussin Dm [Guaifenesin-Dm] Swelling    Patient's tongue swells   Sulfa Antibiotics Swelling   Other     Had to replace "catgut" with clamps     Home Medications  Prior to Admission medications   Medication Sig Start Date End Date Taking? Authorizing Provider  atorvastatin (LIPITOR) 80 MG tablet Take 1 tablet by mouth once daily Patient taking differently: Take 80 mg by mouth daily. 06/26/21   [place]il[place], Peter M, MD  carvedilol (COREG) 3.125 MG tablet Take 1 tablet (3.125 mg total) by mouth 2 (two) times daily with a meal. 08/09/21   Rolly Salter, MD  cetirizine (ZYRTEC) 10 MG tablet Take 10 mg daily by mouth.     [provider]  cholecalciferol (VITAMIN D) 1000 units tablet Take 1,000 Units by mouth every 14 (fourteen) days. Twice a month    [provider]  docusate sodium (COLACE) 100 MG capsule Take 1 capsule (100 mg total)  by mouth 2 (two) times daily as needed for mild constipation. 08/09/21    Rolly Salter, MD  ferrous sulfate 325 (65 FE) MG tablet Take 1 tablet (325 mg total) by mouth 3 (three) times a week. 10/23/19 08/29/20  Swayze, Ava, DO  magnesium oxide (MAG-OX) 400 (241.3 Mg) MG tablet Take 400 mg by mouth every 14 (fourteen) days. Twice a month    [provider]  nitroGLYCERIN (NITROSTAT) 0.4 MG SL tablet Place 1 tablet (0.4 mg total) under the tongue every 5 (five) minutes as needed for chest pain. 08/29/20   [place], Peter M, MD  torsemide (DEMADEX) 20 MG tablet Take 1 tablet (20 mg total) by mouth daily. 08/09/21   Rolly Salter, MD     Critical care time: 67 minutes     CRITICAL CARE Performed by: Lanier Clam   Total critical care time: 67 minutes  Critical care time was exclusive of separately billable procedures and treating other patients.  Critical care was necessary to treat or prevent imminent or life-threatening deterioration.  Critical care was time spent personally by me on the following activities: development of treatment plan with patient and/or surrogate as well as nursing, discussions with consultants, evaluation of patient's response to treatment, examination of patient, obtaining history from patient or surrogate, ordering and performing treatments and interventions, ordering and review of laboratory studies, ordering and review of radiographic studies, pulse oximetry and re-evaluation of patient's condition.  Tessie Fass MSN, AGACNP-BC [facility] Pulmonary/Critical Care Medicine Amion for pager  October 19, 2021, 12:32 PM

## 2021-10-06 ENCOUNTER — Inpatient Hospital Stay (HOSPITAL_COMMUNITY): Payer: Medicare PPO

## 2021-10-06 DIAGNOSIS — I469 Cardiac arrest, cause unspecified: Secondary | ICD-10-CM | POA: Diagnosis not present

## 2021-10-06 DIAGNOSIS — G9340 Encephalopathy, unspecified: Secondary | ICD-10-CM

## 2021-10-06 DIAGNOSIS — N289 Disorder of kidney and ureter, unspecified: Secondary | ICD-10-CM

## 2021-10-06 DIAGNOSIS — R569 Unspecified convulsions: Secondary | ICD-10-CM

## 2021-10-06 LAB — POCT I-STAT 7, (LYTES, BLD GAS, ICA,H+H)
Acid-Base Excess: 0 mmol/L (ref 0.0–2.0)
Acid-base deficit: 1 mmol/L (ref 0.0–2.0)
Acid-base deficit: 3 mmol/L — ABNORMAL HIGH (ref 0.0–2.0)
Bicarbonate: 21 mmol/L (ref 20.0–28.0)
Bicarbonate: 22.6 mmol/L (ref 20.0–28.0)
Bicarbonate: 21 mmol/L (ref 20.0–28.0)
Calcium, Ion: 1.08 mmol/L — ABNORMAL LOW (ref 1.15–1.40)
Calcium, Ion: 1.13 mmol/L — ABNORMAL LOW (ref 1.15–1.40)
Calcium, Ion: 1.12 mmol/L — ABNORMAL LOW (ref 1.15–1.40)
HCT: 41 % (ref 36.0–46.0)
HCT: 41 % (ref 36.0–46.0)
HCT: 44 % (ref 36.0–46.0)
Hemoglobin: 13.9 g/dL (ref 12.0–15.0)
Hemoglobin: 13.9 g/dL (ref 12.0–15.0)
Hemoglobin: 15 g/dL (ref 12.0–15.0)
O2 Saturation: 98 %
O2 Saturation: 99 %
O2 Saturation: 99 %
Patient temperature: 36.1
Patient temperature: 99.9
Patient temperature: 37.2
Potassium: 3.6 mmol/L (ref 3.5–5.1)
Potassium: 4.6 mmol/L (ref 3.5–5.1)
Potassium: 3.8 mmol/L (ref 3.5–5.1)
Sodium: 142 mmol/L (ref 135–145)
Sodium: 140 mmol/L (ref 135–145)
Sodium: 142 mmol/L (ref 135–145)
TCO2: 22 mmol/L (ref 22–32)
TCO2: 24 mmol/L (ref 22–32)
TCO2: 22 mmol/L (ref 22–32)
pCO2 arterial: 25.9 mmHg — ABNORMAL LOW (ref 32.0–48.0)
pCO2 arterial: 32.6 mmHg (ref 32.0–48.0)
pCO2 arterial: 32.6 mmHg (ref 32.0–48.0)
pH, Arterial: 7.513 — ABNORMAL HIGH (ref 7.350–7.450)
pH, Arterial: 7.451 — ABNORMAL HIGH (ref 7.350–7.450)
pH, Arterial: 7.417 (ref 7.350–7.450)
pO2, Arterial: 87 mmHg (ref 83.0–108.0)
pO2, Arterial: 123 mmHg — ABNORMAL HIGH (ref 83.0–108.0)
pO2, Arterial: 139 mmHg — ABNORMAL HIGH (ref 83.0–108.0)

## 2021-10-06 LAB — GLUCOSE, CAPILLARY
Glucose-Capillary: 110 mg/dL — ABNORMAL HIGH (ref 70–99)
Glucose-Capillary: 135 mg/dL — ABNORMAL HIGH (ref 70–99)
Glucose-Capillary: 135 mg/dL — ABNORMAL HIGH (ref 70–99)
Glucose-Capillary: 136 mg/dL — ABNORMAL HIGH (ref 70–99)
Glucose-Capillary: 141 mg/dL — ABNORMAL HIGH (ref 70–99)
Glucose-Capillary: 141 mg/dL — ABNORMAL HIGH (ref 70–99)
Glucose-Capillary: 160 mg/dL — ABNORMAL HIGH (ref 70–99)

## 2021-10-06 LAB — CBC
HCT: 41.7 % (ref 36.0–46.0)
Hemoglobin: 13.7 g/dL (ref 12.0–15.0)
MCH: 28.9 pg (ref 26.0–34.0)
MCHC: 32.9 g/dL (ref 30.0–36.0)
MCV: 88 fL (ref 80.0–100.0)
Platelets: 257 10*3/uL (ref 150–400)
RBC: 4.74 MIL/uL (ref 3.87–5.11)
RDW: 12.8 % (ref 11.5–15.5)
WBC: 28.5 10*3/uL — ABNORMAL HIGH (ref 4.0–10.5)
nRBC: 0 % (ref 0.0–0.2)

## 2021-10-06 LAB — MAGNESIUM
Magnesium: 1.7 mg/dL (ref 1.7–2.4)
Magnesium: 2.3 mg/dL (ref 1.7–2.4)
Magnesium: 2.5 mg/dL — ABNORMAL HIGH (ref 1.7–2.4)

## 2021-10-06 LAB — COMPREHENSIVE METABOLIC PANEL
ALT: 43 U/L (ref 0–44)
AST: 57 U/L — ABNORMAL HIGH (ref 15–41)
Albumin: 2.8 g/dL — ABNORMAL LOW (ref 3.5–5.0)
Alkaline Phosphatase: 146 U/L — ABNORMAL HIGH (ref 38–126)
Anion gap: 15 (ref 5–15)
BUN: 28 mg/dL — ABNORMAL HIGH (ref 8–23)
CO2: 21 mmol/L — ABNORMAL LOW (ref 22–32)
Calcium: 8.5 mg/dL — ABNORMAL LOW (ref 8.9–10.3)
Chloride: 106 mmol/L (ref 98–111)
Creatinine, Ser: 2.52 mg/dL — ABNORMAL HIGH (ref 0.44–1.00)
GFR, Estimated: 20 mL/min — ABNORMAL LOW (ref >60)
Glucose, Bld: 132 mg/dL — ABNORMAL HIGH (ref 70–99)
Potassium: 3.6 mmol/L (ref 3.5–5.1)
Sodium: 142 mmol/L (ref 135–145)
Total Bilirubin: 0.9 mg/dL (ref 0.3–1.2)
Total Protein: 6 g/dL — ABNORMAL LOW (ref 6.5–8.1)

## 2021-10-06 LAB — PHOSPHORUS
Phosphorus: 1.7 mg/dL — ABNORMAL LOW (ref 2.5–4.6)
Phosphorus: 4.5 mg/dL (ref 2.5–4.6)
Phosphorus: 4 mg/dL (ref 2.5–4.6)

## 2021-10-06 LAB — TROPONIN I (HIGH SENSITIVITY)
Troponin I (High Sensitivity): 624 ng/L (ref <18)
Troponin I (High Sensitivity): 556 ng/L (ref <18)

## 2021-10-06 LAB — LACTIC ACID, PLASMA: Lactic Acid, Venous: 1.6 mmol/L (ref 0.5–1.9)

## 2021-10-06 LAB — PROCALCITONIN: Procalcitonin: 39.98 ng/mL

## 2021-10-06 LAB — BRAIN NATRIURETIC PEPTIDE: B Natriuretic Peptide: 2950.5 pg/mL — ABNORMAL HIGH (ref 0.0–100.0)

## 2021-10-06 MED ORDER — FENTANYL 2500MCG IN NS 250ML (10MCG/ML) PREMIX INFUSION
25.0000 ug/h | INTRAVENOUS | Status: DC
  Administered 2021-10-07: 100 ug/h via INTRAVENOUS
  Filled 2021-10-06: qty 250

## 2021-10-06 MED ORDER — NOREPINEPHRINE 4 MG/250ML-% IV SOLN
INTRAVENOUS | Status: AC
  Administered 2021-10-06: 2 ug/min via INTRAVENOUS
  Filled 2021-10-06: qty 250

## 2021-10-06 MED ORDER — IPRATROPIUM-ALBUTEROL 0.5-2.5 (3) MG/3ML IN SOLN
3.0000 mL | RESPIRATORY_TRACT | Status: DC
  Administered 2021-10-06 – 2021-10-09 (×15): 3 mL via RESPIRATORY_TRACT
  Filled 2021-10-06 (×13): qty 3

## 2021-10-06 MED ORDER — VITAL HIGH PROTEIN PO LIQD: 1000.0000 mL | ORAL | Status: DC

## 2021-10-06 MED ORDER — NOREPINEPHRINE 4 MG/250ML-% IV SOLN: 2.0000 ug/min | INTRAVENOUS | Status: DC

## 2021-10-06 MED ORDER — SODIUM CHLORIDE 0.9 % IV SOLN: 250.0000 mL | INTRAVENOUS | Status: DC

## 2021-10-06 MED ORDER — MIDAZOLAM HCL 2 MG/2ML IJ SOLN
1.0000 mg | INTRAMUSCULAR | Status: DC | PRN
  Filled 2021-10-06: qty 2

## 2021-10-06 MED ORDER — FENTANYL BOLUS VIA INFUSION
25.0000 ug | INTRAVENOUS | Status: DC | PRN
  Administered 2021-10-06: 50 ug via INTRAVENOUS
  Filled 2021-10-06: qty 100

## 2021-10-06 MED ORDER — VITAL HIGH PROTEIN PO LIQD
1000.0000 mL | ORAL | Status: DC
  Administered 2021-10-06: 1000 mL

## 2021-10-06 MED ORDER — POLYETHYLENE GLYCOL 3350 17 G PO PACK
17.0000 g | PACK | Freq: Every day | ORAL | Status: DC
  Administered 2021-10-07: 17 g
  Filled 2021-10-06 (×2): qty 1

## 2021-10-06 MED ORDER — MIDAZOLAM HCL 2 MG/2ML IJ SOLN
1.0000 mg | INTRAMUSCULAR | Status: DC | PRN
  Administered 2021-10-07: 1 mg via INTRAVENOUS

## 2021-10-06 MED ORDER — SODIUM CHLORIDE 0.9 % IV SOLN
2.0000 g | INTRAVENOUS | Status: DC
  Administered 2021-10-06 – 2021-10-07 (×2): 2 g via INTRAVENOUS
  Filled 2021-10-06 (×2): qty 2

## 2021-10-06 MED ORDER — VANCOMYCIN VARIABLE DOSE PER UNSTABLE RENAL FUNCTION (PHARMACIST DOSING): Status: DC

## 2021-10-06 MED ORDER — DOCUSATE SODIUM 50 MG/5ML PO LIQD
100.0000 mg | Freq: Two times a day (BID) | ORAL | Status: DC
  Administered 2021-10-06 – 2021-10-07 (×3): 100 mg
  Filled 2021-10-06 (×4): qty 10

## 2021-10-06 MED ORDER — MAGNESIUM SULFATE 2 GM/50ML IV SOLN
2.0000 g | Freq: Once | INTRAVENOUS | Status: AC
  Administered 2021-10-06: 2 g via INTRAVENOUS
  Filled 2021-10-06: qty 50

## 2021-10-06 MED ORDER — PROSOURCE TF PO LIQD
45.0000 mL | Freq: Two times a day (BID) | ORAL | Status: DC
  Administered 2021-10-06: 45 mL
  Filled 2021-10-06: qty 45

## 2021-10-06 MED ORDER — POTASSIUM CHLORIDE 20 MEQ PO PACK
40.0000 meq | PACK | Freq: Once | ORAL | Status: AC
  Administered 2021-10-06: 40 meq
  Filled 2021-10-06: qty 2

## 2021-10-06 MED ORDER — VITAL HIGH PROTEIN PO LIQD
1000.0000 mL | ORAL | Status: DC
  Administered 2021-10-06 – 2021-10-10 (×6): 1000 mL

## 2021-10-06 MED ORDER — POTASSIUM PHOSPHATES 15 MMOLE/5ML IV SOLN
30.0000 mmol | Freq: Once | INTRAVENOUS | Status: AC
  Administered 2021-10-06: 30 mmol via INTRAVENOUS
  Filled 2021-10-06: qty 10

## 2021-10-06 MED ORDER — FUROSEMIDE 10 MG/ML IJ SOLN
40.0000 mg | Freq: Once | INTRAMUSCULAR | Status: AC
  Administered 2021-10-06: 40 mg via INTRAVENOUS
  Filled 2021-10-06: qty 4

## 2021-10-06 MED ORDER — CALCIUM GLUCONATE-NACL 1-0.675 GM/50ML-% IV SOLN
1.0000 g | Freq: Once | INTRAVENOUS | Status: AC
  Administered 2021-10-06: 1000 mg via INTRAVENOUS
  Filled 2021-10-06: qty 50

## 2021-10-06 MED ORDER — HYDRALAZINE HCL 20 MG/ML IJ SOLN
10.0000 mg | INTRAMUSCULAR | Status: DC | PRN
  Administered 2021-10-06: 10 mg via INTRAVENOUS
  Filled 2021-10-06: qty 1

## 2021-10-06 MED ORDER — HYDRALAZINE HCL 20 MG/ML IJ SOLN
20.0000 mg | INTRAMUSCULAR | Status: DC | PRN
  Administered 2021-10-06 (×2): 20 mg via INTRAVENOUS
  Filled 2021-10-06 (×2): qty 1

## 2021-10-06 MED ORDER — FENTANYL CITRATE (PF) 100 MCG/2ML IJ SOLN: 25.0000 ug | Freq: Once | INTRAMUSCULAR | Status: DC

## 2021-10-06 NOTE — Progress Notes (Signed)
Pt transferred from 2M13 to 3M09 with no complications.

## 2021-10-06 NOTE — Progress Notes (Signed)
eLink Physician-Brief Progress Note Patient Name: Roshawnda Pecora DOB: 24-Oct-1951 MRN: [id number]   Date of Service  10/06/2021  HPI/Events of Note  Multiple issues: 1. ABG on 30%/PRVC 20/TV 490/P 5 = 7.51/25.9/87/21. Patient over breathing set ventilator rate at 30-35. 2. Hypertension - SBP = 175. Goal SBP < 140.  eICU Interventions  Plan: Decrease TV to 400 mL. Repeat ABG at 9 AM. Hydralazine 10 mg IV Q 4 hours PRN SBP > 140 or DBP > 100.     Intervention Category Major Interventions: Respiratory failure - evaluation and management;Acid-Base disturbance - evaluation and management  Teyton Pattillo Eugene 10/06/2021, 6:22 AM

## 2021-10-06 NOTE — Progress Notes (Signed)
EEG complete - results pending 

## 2021-10-06 NOTE — Progress Notes (Signed)
eLink Physician-Brief Progress Note Patient Name: Deborah Ho DOB: Jun 12, 1951 MRN: [id number]   Date of Service  10/06/2021  HPI/Events of Note  Vent dyssynchrony. No PRN sedation ordered  BP borderline low with MAP <65 with decreased UOP. EF 20-25%. Suspect low cardiac output  eICU Interventions  PRN sedation ordered Start peripheral levo goal MAP >65 Monitor UOP     Intervention Category Major Interventions: Hypotension - evaluation and management  [name] 10/06/2021, 9:26 PM

## 2021-10-06 NOTE — Progress Notes (Signed)
2 nurses assessed with [place] BLUE. Notified nurse unable to place [place] PIV due to poor vasculature and instructed to contact MD regarding need of CVC line placement. Thayer Ohm RN was instructed to place iwatch to IV site, vasopressor infusing through R AC at this time. VU. Tomasita Morrow RN VAST

## 2021-10-06 NOTE — Plan of Care (Signed)
Attempted to reach patient's brother Jill Side at the number listed [phone], was told this was a wrong number, no other family members listed.   Deborah Gasman Arafat Cocuzza, PA-C

## 2021-10-06 NOTE — Progress Notes (Signed)
Pharmacy Antibiotic Note  Deborah Ho is a 70 y.o. female admitted on 10/03/2021 with s/p cardiac arrest. Pt recently admitted with respiratory failure. Pharmacy asked to dose vancomycin and cefepime for possible PNA. Pt has CKD III, Cr has worsened overnight.  Plan: Hold vancomycin - will need level prior to redose tomorrow afternoon Adjust cefepime to 2g IV q24h Follow Cr, cultures, LOT   Height: 5\' 7"  (170.2 cm) Weight: 110 kg (242 lb 8.1 oz) IBW/kg (Calculated) : 61.6  Temp (24hrs), Avg:96.8 F (36 C), Min:93.2 F (34 C), Max:100.9 F (38.3 C)  Recent Labs  Lab 10/09/2021 1008 09/19/2021 1013 10/06/2021 2056 10/06/21 0427 10/06/21 0445  WBC 26.6*  --   --  28.5*  --   CREATININE 1.92* 1.70* 2.13*  --  2.52*     Estimated Creatinine Clearance: 26.6 mL/min (A) (by C-G formula based on SCr of 2.52 mg/dL (H)).    Allergies  Allergen Reactions   Aspirin Swelling    Chewable children's aspirin makes patients tongue and face swell   Effient [Prasugrel] Swelling    Patient's tongue and face swells   Entresto [Sacubitril-Valsartan] Other (See Comments)    dizziness   Lactose Intolerance (Gi) Other (See Comments)    REACTION: stomach upset   Robitussin Dm [Guaifenesin-Dm] Swelling    Patient's tongue swells   Sulfa Antibiotics Swelling   Other     Had to replace "catgut" with clamps    Antimicrobials this admission: Vancomycin 11/20 >>  Cefepime 11/20 >>  Microbiology results: 11/20 MRSA PCR: negative 11/20 BCx: sent  Thank you for allowing pharmacy to be a part of this patient's care.  12/20, PharmD, BCPS, [facility] Clinical Pharmacist [phone] Please check AMION for all [facility] Pharmacy numbers 10/06/2021

## 2021-10-06 NOTE — Progress Notes (Signed)
Patient belongings received from 2H include clothes, dentures, pearl earrings, shoes, and general medical supplies.   Patient's "niece" called for update but stated she is not the primary contact and does not have the password set up for the patient. Briefly updated to patient's critical condition and requested that the patient's brother contact us as soon as he is able with updated contact information given her critical status. She reported she would relay the message to the patient's brother.

## 2021-10-06 NOTE — Procedures (Signed)
Patient Name: Deborah Ho  MRN: [id number]  Epilepsy Attending: Charlsie Quest  Referring Physician/Provider: Gleason, Darcella Gasman, PA-C Date: 10/06/2021 Duration: 23.04 mins  Patient history: 70yo F s/p cardiac arrest. EEG to evaluate for seizure  Level of alertness: comatose  AEDs during EEG study: None  Technical aspects: This EEG study was done with scalp electrodes positioned according to the 10-20 International system of electrode placement. Electrical activity was acquired at a sampling rate of 500Hz  and reviewed with a high frequency filter of 70Hz  and a low frequency filter of 1Hz . EEG data were recorded continuously and digitally stored.   Description: No posterior dominant rhythm was seen. EEG showed continuous generalized 3 to 6 Hz theta-delta slowing. Sharp waves were noted in left anterior temporal region, maximal F7 which at times appear qasi-periodic at 1hz . Hyperventilation and photic stimulation were not performed.     ABNORMALITY - Sharp wave, left anterior temporal region - Continuous slow, generalized  IMPRESSION: This study showed evidence of potential epileptogenicity arising from left anterior temporal region. Additionally there is moderate to severe diffuse encephalopathy, nonspecific etiology. No seizures were seen throughout the recording.  [name] 

## 2021-10-06 NOTE — Plan of Care (Signed)
Care plan initiated.

## 2021-10-06 NOTE — Progress Notes (Signed)
Dear Doctor: This patient has been identified as a candidate for CVC for the following reason (s): Patient has extremely poor vasculature, on vasopressor. Unable to locate appropriate vein with [place] to place PIV for vasopressor. Nurse notified. If you agree, please write an order for the indicated device.   Thank you for supporting the early vascular access assessment program.

## 2021-10-06 NOTE — Progress Notes (Signed)
Initial Nutrition Assessment  DOCUMENTATION CODES:   Obesity unspecified  INTERVENTION:   Tube Feeding via OG:  Vital High Protein at 65 ml/hr Provides 137 g of protein, 1560 kcals, 1310 mL of free water   NUTRITION DIAGNOSIS:   Inadequate oral intake related to acute illness as evidenced by NPO status.  GOAL:   Patient will meet greater than or equal to 90% of their needs  MONITOR:   Vent status, Weight trends, TF tolerance, Labs  REASON FOR ASSESSMENT:   Consult, Ventilator Enteral/tube feeding initiation and management  ASSESSMENT:   70 yo female admitted with cardiac arrest requiring intubation. PMH includes ischemic CM with EF 25%, CKD 3, HTN, HLD.  48 hour TTM Pt remains on vent support, off sedation with poor neuro exam  OG tube in place per chest xray  Unable to obtain diet and weight history  Limited exam due to edema and cooling pads for TTM  Labs: reviewed Meds: ss novolog, KCL, potassium phosphate   NUTRITION - FOCUSED PHYSICAL EXAM:  Flowsheet Row Most Recent Value  Orbital Region No depletion  Upper Arm Region No depletion  Thoracic and Lumbar Region Unable to assess  Buccal Region Unable to assess  Temple Region No depletion  Clavicle Bone Region No depletion  Clavicle and Acromion Bone Region No depletion  Scapular Bone Region Unable to assess  Dorsal Hand Unable to assess  Patellar Region Unable to assess  Anterior Thigh Region Unable to assess  Posterior Calf Region Unable to assess  Edema (RD Assessment) Moderate       Diet Order:   Diet Order             Diet NPO time specified  Diet effective now                   EDUCATION NEEDS:   Not appropriate for education at this time  Skin:  Skin Assessment: Reviewed RN Assessment  Last BM:  11/20  Height:   Ht Readings from Last 1 Encounters:  10-18-2021 5\' 7"  (1.702 m)    Weight:   Wt Readings from Last 1 Encounters:  10/06/21 110 kg    BMI:  Body mass  index is 37.98 kg/m.  Estimated Nutritional Needs:   Kcal:  1400-1600 kcals  Protein:  125-150 g  Fluid:  >/= 1.5 L    10/08/21 MS, RDN, LDN, CNSC Registered Dietitian III Clinical Nutrition RD Pager and On-Call Pager Number Located in [place]

## 2021-10-06 NOTE — TOC Progression Note (Signed)
Transition of Care [facility]) - Initial/Assessment Note    Patient Details  Name: Tilley Faeth MRN: [id number] Date of Birth: 06/24/1951  Transition of Care [facility]) CM/SW Contact:    Ralene Bathe, LCSWA Phone Number: 10/06/2021, 4:08 PM  Clinical Narrative:                 CSW attempted to contact patient's family.   CSW called the number listed for the patient's brother. This was not a correct number CSW called the telephone number listed under "home".  The phone rang continuously. CSW called the number listed under "mobile".  There was a busy tone.  TOC will continue to follow patient for any d/c planning needs once medically stable.  Cleon Gustin, MSW, LCSWA         Patient Goals and CMS Choice        Expected Discharge Plan and Services                                                Prior Living Arrangements/Services                       Activities of Daily Living      Permission Sought/Granted                  Emotional Assessment              Admission diagnosis:  Cardiac arrest [facility]) [I46.9] Patient Active Problem List   Diagnosis Date Noted   Encephalopathy acute    Renal insufficiency    Cardiac arrest (HCC) 10/08/2021   Acute respiratory failure with hypoxia (HCC) 08/05/2021   Palliative care by specialist    Goals of care, counseling/discussion    Acute on chronic diastolic CHF (congestive heart failure) (HCC) 10/12/2019   Acute systolic CHF (congestive heart failure) (HCC) 10/11/2019   Vitamin D deficiency 09/07/2019   Obesity with body mass index 30 or greater 09/07/2019   Abdominal pain 12/16/2018   Acute CHF (congestive heart failure) (HCC) 12/15/2018   Acute exacerbation of CHF (congestive heart failure) (HCC) 11/28/2018   Acute on chronic systolic CHF (congestive heart failure) (HCC) 11/27/2018   Acute on chronic combined systolic and diastolic CHF (congestive heart failure) (HCC) 04/02/2017    Noncompliance with medication regimen 04/02/2017   Intractable nausea and vomiting 02/18/2017   Hyperlipidemia    GERD (gastroesophageal reflux disease)    CHF (congestive heart failure) (HCC)    Cellulitis of left lower extremity    Cough productive of clear sputum    Cellulitis of left lower leg    Hypokalemia 01/28/2017   Peripheral edema    Abdominal distension 06/26/2016   Anasarca 06/26/2016   Chronic systolic heart failure (HCC) 02/20/2016   CKD (chronic kidney disease), stage III (HCC)    Adjustment disorder with mixed anxiety and depressed mood 02/28/2015   Pleural effusion on right    Primary hypertension    Acute on chronic systolic heart failure (HCC) 08/22/2014   Angioedema of lips 08/17/2014   Ischemic cardiomyopathy 08/13/2014   Coronary atherosclerosis of native coronary artery 05/10/2014   History of ST elevation myocardial infarction (STEMI) 05/09/2014   HLD (hyperlipidemia) 05/06/2014   Tobacco abuse 05/06/2014   Cardiomyopathy, ischemic 05/06/2014   Elevated LFTs 05/06/2014   PCP:  Grayce Sessions, NP  Pharmacy:   [place] ([place]), [place] - [address] [place] ([place]) [place] [address] Phone: [phone] Fax: [facility] [address] [place] [address] Phone: [phone] Fax: [phone]     Social Determinants of Health (SDOH) Interventions    Readmission Risk Interventions Readmission Risk Prevention Plan 10/21/2019 10/17/2019 04/27/2019  Transportation Screening Complete Complete Complete  PCP or Specialist Appt within 3-5 Days Not Complete - Complete  HRI or Home Care Consult Complete Complete Complete  Social Work Consult for [place] Planning/Counseling Complete Complete Complete  Palliative Care Screening Not Applicable Complete Not Applicable  Medication Review Press photographer) Complete Complete  Complete  Some recent data might be hidden

## 2021-10-06 NOTE — Progress Notes (Addendum)
NAME:  Deborah Ho, MRN:  [id number], DOB:  10/25/1951, LOS: 1 ADMISSION DATE:  10/04/2021, CONSULTATION DATE:  09/30/2021 REFERRING MD:  Tegeler, CHIEF COMPLAINT:  cardiac arrest    History of Present Illness:  History obtained from chart review and care management team.   70 yo F PMH ICM Chronic systolic HF CKD3b HTN covid infection morbid obesity who was at a bus stop complaining of not feeling well when she collapsed, witnessed. EMS called, until arrival. On arrival was pulseless and received DF x1 though initial rhythm is not known, followed by further ACLS for about 30-40 minutes on and off -- PEA throughout. Arrived to ED on [facility] device, pulseless, and achieved ROSC when intubated by EDP. The patient has received no sedation or RSI medications.  ECG did not show STEMI   Patient was notably recently admitted (07/2021) with hypoxic respiratory failure with pulmonary edema requiring mechanical ventilation due to acute exacerbation of systolic HF. At that time, EF 20-25%)   Pertinent  Medical History  HTN HLD Morbid obesity ICM CKD 3b   Significant Hospital Events: Including procedures, antibiotic start and stop dates in addition to other pertinent events   11/20 presented to ED actively receiving CPR after witnessed collapse at bus stop. Downtime 30 min, did briefly achieve ROSC a few times in field.  11/21 remains intubated, off sedation this AM with poor neuro exam, no pressor requirement    Imaging 11/20 CTH> There is new area of low-density in the left posterior parietal cortex suggesting age indeterminate infarction. If clinically warranted, follow-up MRI may be considered. Atrophy. Small-vessel disease.  11/20 CTA Chest>> No PE, ground-glass and alveolar infiltrates in both lungs with overall worsening. Findings suggest bilateral pneumonia. Small pleural effusions, more so on the right side.  Tubes/Lines 11/21>ETT   Abx Cefepime 11/21- Vancomycin  11/21-  Interim History / Subjective:  Agitated overnight and started on Fentanyl, off since 5am and tolerating PSV, but not waking up  Off pressors Trying to spike fevers  Objective   Blood pressure (!) 155/68, pulse 99, temperature 99.7 F (37.6 C), resp. rate 20, height 5\' 7"  (1.702 m), weight 110 kg, SpO2 98 %.    Vent Mode: PSV;CPAP FiO2 (%):  [30 %-100 %] 30 % Set Rate:  [18 bmp-28 bmp] 28 bmp Vt Set:  [400 mL-490 mL] 400 mL PEEP:  [5 cmH20] 5 cmH20 Pressure Support:  [8 cmH20] 8 cmH20 Plateau Pressure:  [22 cmH20-27 cmH20] 22 cmH20   Intake/Output Summary (Last 24 hours) at 10/06/2021 0915 Last data filed at 10/06/2021 0800 Gross per 24 hour  Intake 1792.69 ml  Output 2590 ml  Net -797.31 ml   Filed Weights   09/18/2021 1044 10/14/2021 1459 10/06/21 0500  Weight: 110.3 kg 107.4 kg 110 kg     General:  obese F, intubated and unresponsive, appears volume overloaded  HEENT: MM pink/moist, copious white, frothy sputum, sclera anicteric  Neuro: unresponsive off sedation to voice and pain, pupils equal and minimally responsive to light, no gag or corneal reflex, triggering breaths on vent  CV: s1s2 rrr, no m/r/g PULM:  copious secretions with course upper airway sounds, on mechanical ventilation on PSV/CPAP 30% GI/GU: soft, obese, clear urine in foley bag Extremities: warm/dry, 2+ edema  Skin: no rashes or lesions   Resolved Hospital Problem list     Assessment & Plan:    Out of hospital Cardiac Arrest with subsequent acute hypoxic/hypercarbic respiratory failure and encephalopathy Initial rhythm not known, did  get DF x1 with Fire but was PEA for all efforts with EMS and ED. Down for 30-40 min total, brief periods of ROSC Hx HFrEF with EF 20-25%  -Unclear etiology at this time, though patient with bilateral PNA, per notes complained of "not feeling well" prior to arrest -CT with possible L parietal indeterminate infarction, will need MRI -continue supportive care  with ventilator, normothermia, euglycemia and electrolyte replacement -pt was in unknown rhythm, but was shockable, trop 297 yesterday, EKG not suggestive of STEMI, continue to trend trop -EEG -fentanyl gtt -Maintain full vent support with SAT/SBT as tolerated -titrate Vent setting to maintain SpO2 greater than or equal to 90%. -HOB elevated 30 degrees. -Plateau pressures less than 30 cm H20.  -Follow chest x-ray, ABG prn.   -Bronchial hygiene and RT/bronchodilator protocol.       Bilateral PNA, CAP vs. Possible aspiration Pulmonary edema Flu/Covid neg P -continue ventilator support -blood and respiratory cultures as well as extended RVP -continue Cefepime/Vancomycin -trend lactic acid -2.4L UOP yesterday after Lasix 40mg , repeat dose today -Procal 0.10 yesterday, trend   Acute on Chronic Kidney injury Hypophosphatemia  Creatinine on admission 2.5, baseline appears to be 1.7 Suspect ATN in the setting of 30 minutes cardiac arrest P: -monitor renal indices and UOP, avoid nephrotoxins as able -Mag and phos replaceed    Hyperglycemia -SSI   Elevated LFTs Seems like this has been a chronic issue in review of records P -Continue to trend    HTN -resume Fentanyl for pain and increase prn Hydralazine, continue prn beta blocker   Best Practice (right click and "Reselect all SmartList Selections" daily)   Diet/type: tubefeeds DVT prophylaxis: prophylactic heparin  GI prophylaxis: PPI Lines: N/A Foley:  Yes, and it is still needed Code Status:  full code Last date of multidisciplinary goals of care discussion [unable to reach family]  Labs   CBC: Recent Labs  Lab 2021/10/11 1008 11-Oct-2021 1013 2021-10-11 1124 2021-10-11 1609 10/06/21 0427 10/06/21 0503 10/06/21 0842  WBC 26.6*  --   --   --  28.5*  --   --   NEUTROABS 18.1*  --   --   --   --   --   --   HGB 13.5   < > 13.6 15.0 13.7 13.9 13.9  HCT 46.1*   < > 40.0 44.0 41.7 41.0 41.0  MCV 100.2*  --   --    --  88.0  --   --   PLT 257  --   --   --  257  --   --    < > = values in this interval not displayed.     Basic Metabolic Panel: Recent Labs  Lab 2021/10/11 1008 10/11/21 1013 10-11-2021 1124 10-11-2021 1609 Oct 11, 2021 2056 10/06/21 0445 10/06/21 0503 10/06/21 0842  NA 136 140   < > 140 143 142 142 140  K 4.1 4.1   < > 4.4 4.2 3.6 3.6 4.6  CL 107 109  --   --  109 106  --   --   CO2 14*  --   --   --  15* 21*  --   --   GLUCOSE 282* 275*  --   --  192* 132*  --   --   BUN 17 18  --   --  24* 28*  --   --   CREATININE 1.92* 1.70*  --   --  2.13* 2.52*  --   --  CALCIUM 8.2*  --   --   --  8.5* 8.5*  --   --   MG 2.4  --   --   --   --  1.7  --   --   PHOS  --   --   --   --   --  1.7*  --   --    < > = values in this interval not displayed.    GFR: Estimated Creatinine Clearance: 26.6 mL/min (A) (by C-G formula based on SCr of 2.52 mg/dL (H)). Recent Labs  Lab 09/21/2021 1008 10/06/21 0427  PROCALCITON 0.10  --   WBC 26.6* 28.5*     Liver Function Tests: Recent Labs  Lab 10/04/2021 1008 10/06/21 0445  AST 54* 57*  ALT 32 43  ALKPHOS 174* 146*  BILITOT 0.7 0.9  PROT 5.8* 6.0*  ALBUMIN 2.8* 2.8*    No results for input(s): LIPASE, AMYLASE in the last 168 hours. No results for input(s): AMMONIA in the last 168 hours.  ABG    Component Value Date/Time   PHART 7.451 (H) 10/06/2021 0842   PCO2ART 32.6 10/06/2021 0842   PO2ART 123 (H) 10/06/2021 0842   HCO3 22.6 10/06/2021 0842   TCO2 24 10/06/2021 0842   ACIDBASEDEF 1.0 10/06/2021 0503   O2SAT 99.0 10/06/2021 0842      Coagulation Profile: Recent Labs  Lab 10/11/2021 1008  INR 1.2     Cardiac Enzymes: No results for input(s): CKTOTAL, CKMB, CKMBINDEX, TROPONINI in the last 168 hours.  HbA1C: Hgb A1c MFr Bld  Date/Time Value Ref Range Status  08/06/2021 01:59 PM 5.8 (H) 4.8 - 5.6 % Final    Comment:    (NOTE)         Prediabetes: 5.7 - 6.4         Diabetes: >6.4         Glycemic control for adults  with diabetes: <7.0   09/29/2017 04:15 AM 6.2 (H) 4.8 - 5.6 % Final    Comment:    (NOTE) Pre diabetes:          5.7%-6.4% Diabetes:              >6.4% Glycemic control for   <7.0% adults with diabetes     CBG: Recent Labs  Lab 10/04/2021 1616 10/14/2021 1933 09/28/2021 2349 10/06/21 0349 10/06/21 0733  GLUCAP 256* 191* 159* 135* 110*    Review of Systems:   Unable to obtain intubated and unresponsive   Past Medical History:  She,  has a past medical history of CAD (coronary artery disease) (05/03/14; 05/09/14), CHF (congestive heart failure) (HCC), GERD (gastroesophageal reflux disease), HTN (hypertension), Hyperlipidemia, Ischemic cardiomyopathy, Non compliance w medication regimen, and Tobacco use.   Surgical History:   Past Surgical History:  Procedure Laterality Date   CHOLECYSTECTOMY     CORONARY ANGIOPLASTY WITH STENT PLACEMENT  05/03/14   STEMI- stent to LAD DES- Xience alpine   CORONARY ANGIOPLASTY WITH STENT PLACEMENT  05/09/14   STEMI- overlapping stent to LAD, pt had missed a dose of Brilinta   LEFT HEART CATHETERIZATION WITH CORONARY ANGIOGRAM N/A 05/03/2014   Procedure: LEFT HEART CATHETERIZATION WITH CORONARY ANGIOGRAM;  Surgeon: Peter M Swaziland, MD;  Location: [facility] CATH LAB;  Service: Cardiovascular;  Laterality: N/A;   LEFT HEART CATHETERIZATION WITH CORONARY ANGIOGRAM N/A 05/09/2014   Procedure: LEFT HEART CATHETERIZATION WITH CORONARY ANGIOGRAM;  Surgeon: Corky Crafts, MD;  Location: [facility] CATH LAB;  Service: Cardiovascular;  Laterality: N/A;   PARTIAL HYSTERECTOMY     PERCUTANEOUS STENT INTERVENTION  05/03/2014   Procedure: PERCUTANEOUS STENT INTERVENTION;  Surgeon: Peter M Swaziland, MD;  Location: [facility] C[facility] LAB;  Service: Cardiovascular;;  DES Prox LAD      Social History:   reports that she has quit smoking. Her smoking use included cigarettes. She has a 0.05 pack-year smoking history. She has quit using smokeless tobacco. She reports that she does not drink  alcohol and does not use drugs.   Family History:  Her family history includes Diabetes in her mother; Hypertension in her mother.   Allergies Allergies  Allergen Reactions   Aspirin Swelling    Chewable children's aspirin makes patients tongue and face swell   Effient [Prasugrel] Swelling    Patient's tongue and face swells   Entresto [Sacubitril-Valsartan] Other (See Comments)    dizziness   Lactose Intolerance (Gi) Other (See Comments)    REACTION: stomach upset   Robitussin Dm [Guaifenesin-Dm] Swelling    Patient's tongue swells   Sulfa Antibiotics Swelling   Other     Had to replace "catgut" with clamps     Home Medications  Prior to Admission medications   Medication Sig Start Date End Date Taking? Authorizing Provider  atorvastatin (LIPITOR) 80 MG tablet Take 1 tablet by mouth once daily Patient taking differently: Take 80 mg by mouth daily. 06/26/21   Swaziland, Peter M, MD  carvedilol (COREG) 3.125 MG tablet Take 1 tablet (3.125 mg total) by mouth 2 (two) times daily with a meal. 08/09/21   Rolly Salter, MD  cetirizine (ZYRTEC) 10 MG tablet Take 10 mg daily by mouth.     [provider]  cholecalciferol (VITAMIN D) 1000 units tablet Take 1,000 Units by mouth every 14 (fourteen) days. Twice a month    [provider]  docusate sodium (COLACE) 100 MG capsule Take 1 capsule (100 mg total) by mouth 2 (two) times daily as needed for mild constipation. 08/09/21   Rolly Salter, MD  ferrous sulfate 325 (65 FE) MG tablet Take 1 tablet (325 mg total) by mouth 3 (three) times a week. 10/23/19 08/29/20  Swayze, Ava, DO  magnesium oxide (MAG-OX) 400 (241.3 Mg) MG tablet Take 400 mg by mouth every 14 (fourteen) days. Twice a month    [provider]  nitroGLYCERIN (NITROSTAT) 0.4 MG SL tablet Place 1 tablet (0.4 mg total) under the tongue every 5 (five) minutes as needed for chest pain. 08/29/20   [place]il[place], Peter M, MD  torsemide (DEMADEX) 20 MG tablet Take 1  tablet (20 mg total) by mouth daily. 08/09/21   Rolly Salter, MD     Critical care time: 45 minutes     CRITICAL CARE Performed by: Darcella Gasman Meyer Arora   Total critical care time: 45 minutes  Critical care time was exclusive of separately billable procedures and treating other patients.  Critical care was necessary to treat or prevent imminent or life-threatening deterioration.  Critical care was time spent personally by me on the following activities: development of treatment plan with patient and/or surrogate as well as nursing, discussions with consultants, evaluation of patient's response to treatment, examination of patient, obtaining history from patient or surrogate, ordering and performing treatments and interventions, ordering and review of laboratory studies, ordering and review of radiographic studies, pulse oximetry and re-evaluation of patient's condition.  Darcella Gasman Oris Calmes, PA-C [facility] See Amion for pager If no response to pager ,  please call [phone] until 7pm After 7:00 pm call Elink  [phone]

## 2021-10-07 DIAGNOSIS — I5023 Acute on chronic systolic (congestive) heart failure: Secondary | ICD-10-CM

## 2021-10-07 DIAGNOSIS — I469 Cardiac arrest, cause unspecified: Secondary | ICD-10-CM | POA: Diagnosis not present

## 2021-10-07 DIAGNOSIS — G9340 Encephalopathy, unspecified: Secondary | ICD-10-CM | POA: Diagnosis not present

## 2021-10-07 LAB — CBC
HCT: 44.1 % (ref 36.0–46.0)
Hemoglobin: 13.7 g/dL (ref 12.0–15.0)
MCH: 28.8 pg (ref 26.0–34.0)
MCHC: 31.1 g/dL (ref 30.0–36.0)
MCV: 92.6 fL (ref 80.0–100.0)
Platelets: 253 10*3/uL (ref 150–400)
RBC: 4.76 MIL/uL (ref 3.87–5.11)
RDW: 13.5 % (ref 11.5–15.5)
WBC: 25.1 10*3/uL — ABNORMAL HIGH (ref 4.0–10.5)
nRBC: 0.1 % (ref 0.0–0.2)

## 2021-10-07 LAB — COMPREHENSIVE METABOLIC PANEL
ALT: 37 U/L (ref 0–44)
AST: 61 U/L — ABNORMAL HIGH (ref 15–41)
Albumin: 2.7 g/dL — ABNORMAL LOW (ref 3.5–5.0)
Alkaline Phosphatase: 133 U/L — ABNORMAL HIGH (ref 38–126)
Anion gap: 13 (ref 5–15)
BUN: 50 mg/dL — ABNORMAL HIGH (ref 8–23)
CO2: 19 mmol/L — ABNORMAL LOW (ref 22–32)
Calcium: 8.2 mg/dL — ABNORMAL LOW (ref 8.9–10.3)
Chloride: 109 mmol/L (ref 98–111)
Creatinine, Ser: 3.9 mg/dL — ABNORMAL HIGH (ref 0.44–1.00)
GFR, Estimated: 12 mL/min — ABNORMAL LOW (ref >60)
Glucose, Bld: 168 mg/dL — ABNORMAL HIGH (ref 70–99)
Potassium: 3.8 mmol/L (ref 3.5–5.1)
Sodium: 141 mmol/L (ref 135–145)
Total Bilirubin: 0.5 mg/dL (ref 0.3–1.2)
Total Protein: 6.1 g/dL — ABNORMAL LOW (ref 6.5–8.1)

## 2021-10-07 LAB — POCT I-STAT 7, (LYTES, BLD GAS, ICA,H+H)
Acid-base deficit: 4 mmol/L — ABNORMAL HIGH (ref 0.0–2.0)
Bicarbonate: 19.8 mmol/L — ABNORMAL LOW (ref 20.0–28.0)
Calcium, Ion: 1.13 mmol/L — ABNORMAL LOW (ref 1.15–1.40)
HCT: 41 % (ref 36.0–46.0)
Hemoglobin: 13.9 g/dL (ref 12.0–15.0)
O2 Saturation: 99 %
Patient temperature: 99.1
Potassium: 3.6 mmol/L (ref 3.5–5.1)
Sodium: 144 mmol/L (ref 135–145)
TCO2: 21 mmol/L — ABNORMAL LOW (ref 22–32)
pCO2 arterial: 33.5 mmHg (ref 32.0–48.0)
pH, Arterial: 7.38 (ref 7.350–7.450)
pO2, Arterial: 132 mmHg — ABNORMAL HIGH (ref 83.0–108.0)

## 2021-10-07 LAB — GLUCOSE, CAPILLARY
Glucose-Capillary: 111 mg/dL — ABNORMAL HIGH (ref 70–99)
Glucose-Capillary: 122 mg/dL — ABNORMAL HIGH (ref 70–99)
Glucose-Capillary: 129 mg/dL — ABNORMAL HIGH (ref 70–99)
Glucose-Capillary: 145 mg/dL — ABNORMAL HIGH (ref 70–99)
Glucose-Capillary: 157 mg/dL — ABNORMAL HIGH (ref 70–99)
Glucose-Capillary: 169 mg/dL — ABNORMAL HIGH (ref 70–99)

## 2021-10-07 LAB — MAGNESIUM
Magnesium: 2.5 mg/dL — ABNORMAL HIGH (ref 1.7–2.4)
Magnesium: 2.7 mg/dL — ABNORMAL HIGH (ref 1.7–2.4)

## 2021-10-07 LAB — PROCALCITONIN: Procalcitonin: 44.68 ng/mL

## 2021-10-07 LAB — PHOSPHORUS
Phosphorus: 5.2 mg/dL — ABNORMAL HIGH (ref 2.5–4.6)
Phosphorus: 6.9 mg/dL — ABNORMAL HIGH (ref 2.5–4.6)

## 2021-10-07 LAB — CALCIUM, IONIZED: Calcium, Ionized, Serum: 4.8 mg/dL (ref 4.5–5.6)

## 2021-10-07 LAB — TROPONIN I (HIGH SENSITIVITY): Troponin I (High Sensitivity): 523 ng/L (ref <18)

## 2021-10-07 MED ORDER — PANTOPRAZOLE 2 MG/ML SUSPENSION
40.0000 mg | Freq: Every day | ORAL | Status: DC
Start: 2021-10-07 — End: 2021-10-10
  Administered 2021-10-07 – 2021-10-09 (×3): 40 mg
  Filled 2021-10-07 (×3): qty 20

## 2021-10-07 NOTE — Plan of Care (Signed)
  Interdisciplinary Goals of Care Family Meeting   Date carried out:: 10/07/2021  Location of the meeting: Unit  Member's involved: Nurse Practitioner and Family Member or next of kin  Durable Power of Attorney or acting medical decision maker: Patients brother and niece but medical decision will be shared among all siblings   Discussion: We discussed goals of care for Deborah Ho. Patients brother and niece were updated regarding patients critical illness with multisystem organ dysfunction. Family states that Deborah Ho is a very independent person and would want to value her quality of life over all else. The family would like to give Deborah Ho some time to see how her organ dysfunction response to treatment but if she continues to decline the family would likely consider comfort care.   The family will met together today to discuss code status.   Code status: Full Code  Disposition: Continue current acute care  Time spent for the meeting: 75  Deborah Flott D. Kenton Kingfisher, NP-C [facility] Personal contact information can be found on Amion  10/07/2021, 12:46 PM

## 2021-10-07 NOTE — Progress Notes (Addendum)
NAME:  Deborah Ho, MRN:  [id number], DOB:  [birth year], LOS: 2 ADMISSION DATE:  10-13-21, CONSULTATION DATE:  Oct 13, 2021 REFERRING MD:  Tegeler, CHIEF COMPLAINT:  cardiac arrest    History of Present Illness:  History obtained from chart review and care management team.   70 yo F PMH ICM Chronic systolic HF CKD3b HTN covid infection morbid obesity who was at a bus stop complaining of not feeling well when she collapsed, witnessed. EMS called, until arrival. On arrival was pulseless and received DF x1 though initial rhythm is not known, followed by further ACLS for about 30-40 minutes on and off -- PEA throughout. Arrived to ED on [facility] device, pulseless, and achieved ROSC when intubated by EDP. The patient has received no sedation or RSI medications.  ECG did not show STEMI  Patient was notably recently admitted (07/2021) with hypoxic respiratory failure with pulmonary edema requiring mechanical ventilation due to acute exacerbation of systolic HF. At that time, EF 20-25%)   Pertinent  Medical History  HTN HLD Morbid obesity ICM CKD 3b   Significant Hospital Events: Including procedures, antibiotic start and stop dates in addition to other pertinent events   11/20 presented to ED actively receiving CPR after witnessed collapse at bus stop. Downtime 30 min, did briefly achieve ROSC a few times in field.  11/21 remains intubated, off sedation this AM with poor neuro exam, no pressor requirement 11/22 Urine output dropped off significantly overnight with increased creatinine of 3.90 and BUN 50  Imaging 11/20 CTH There is new area of low-density in the left posterior parietal cortex suggesting age indeterminate infarction. 11/20 CTA Chest No PE, ground-glass and alveolar infiltrates in both lungs with overall worsening. Findings suggest bilateral pneumonia. Small pleural effusions, more so on the right side.  Tubes/Lines 11/20 Foley > 11/20 Aline > 11/21 ETT >  Abx Cefepime  11/21- Vancomycin 11/21-  Interim History / Subjective:  Decreased urine output overnight Fentanyl drip started overnight for vent dyssynchrony   Objective   Blood pressure 110/61, pulse 95, temperature 98.6 F (37 C), resp. rate (!) 25, height 5\' 7"  (1.702 m), weight 108.9 kg, SpO2 98 %.    Vent Mode: PRVC FiO2 (%):  [30 %] 30 % Set Rate:  [8 bmp-28 bmp] 8 bmp Vt Set:  [400 mL] 400 mL PEEP:  [5 cmH20] 5 cmH20 Pressure Support:  [8 cmH20] 8 cmH20 Plateau Pressure:  [23 cmH20] 23 cmH20   Intake/Output Summary (Last 24 hours) at 10/07/2021 0708 Last data filed at 10/07/2021 0000 Gross per 24 hour  Intake 1067.93 ml  Output 1420 ml  Net -352.07 ml    Filed Weights   Oct 13, 2021 1459 10/06/21 0500 10/07/21 0500  Weight: 107.4 kg 110 kg 108.9 kg    Physical exam  General: Acute on chronically ill appearing elderly female lying in bed on mechanical ventilation, in NAD HEENT: ETT, MM pink/moist, PERRL,  Neuro: No response on low dose fentanyl drip CV: s1s2 regular rate and rhythm, no murmur, rubs, or gallops,  PULM:  Clear to ascultation bilaterally, tolerating vent, no increased work of breathing GI: soft, bowel sounds active in all 4 quadrants, non-tender, non-distended, tolerating TF Extremities: warm/dry, no edema  Skin: no rashes or lesions  Resolved Hospital Problem list     Assessment & Plan:    Out of hospital Cardiac Arrest with subsequent acute hypoxic/hypercarbic respiratory failure and encephalopathy -Initial rhythm not known, did get DF x1 but was PEA for all efforts with EMS  and ED. Down for 30-40 min total, brief periods of ROSC -Unclear etiology at this time, though patient with bilateral PNA, per notes complained of "not feeling well" prior to arrest --pt was in unknown rhythm, but was shockable, trop 297 yesterday, EKG not suggestive of STEMI, continue to trend trop Hx HFrEF with EF 20-25%  -ECHO 10/15/2021 with EF 20-25% with global hypokinesis, similar  EF on ECHO 07/2021 P: Normothermia  Continuous telemetry  Strict intake and output  Daily weight to assess volume status Daily assessment for need to diurese with the use of IV lasix   Closely monitor renal function and electrolytes  Ensure hemodynamic control Continue pressors for MAP goal >65  At risk anoxic injury  -CT with possible L parietal indeterminate infarction, will need MRI P: Maintain neuro protective measures; goal for eurothermia, euglycemia, eunatermia, normoxia, and PCO2 goal of 35-40 Nutrition and bowel regiment  Seizure precautions  AEDs per neurology  Aspirations precautions  EEG   Acute Hypoxic / Hypercapnic Respiratory Failure  -In the setting of cardiac arrest  Bilateral PNA, CAP vs. Possible aspiration Pulmonary edema P: Continue ventilator support with lung protective strategies  Wean PEEP and FiO2 for sats greater than 90%. Head of bed elevated 30 degrees. Plateau pressures less than 30 cm H20.  Follow intermittent chest x-ray and ABG.   SAT/SBT as tolerated, mentation preclude extubation  Ensure adequate pulmonary hygiene  Follow cultures  VAP bundle in place  PAD protocol Empiric Cefepime and vancomycin   Acute on Chronic Kidney injury Hypophosphatemia  -Creatinine on admission 2.5, baseline appears to be 1.7 -Suspect ATN in the setting of 30 minutes cardiac arrest P: Creatinine continue to rise with significant decrease in urine output overnight  Will discuss with family goals of care today  Follow renal function  Monitor urine output Trend Bmet Avoid nephrotoxins Ensure adequate renal perfusion  IV hydration  Hyperglycemia P: Continue SSI  CBG check q4 CBG goal 140-180  Elevated LFTs -Seems like this has been a chronic issue in review of records P: Avoid nephrotoxins  Trend    Best Practice (right click and "Reselect all SmartList Selections" daily)   Diet/type: tubefeeds DVT prophylaxis: prophylactic heparin  GI  prophylaxis: PPI Lines: N/A Foley:  Yes, and it is still needed Code Status:  full code Last date of multidisciplinary goals of care discussion [unable to reach family]  Critical care time:   CRITICAL CARE Performed by: Whitney D. Harris  Total critical care time: 38 minutes  Critical care time was exclusive of separately billable procedures and treating other patients.  Critical care was necessary to treat or prevent imminent or life-threatening deterioration.  Critical care was time spent personally by me on the following activities: development of treatment plan with patient and/or surrogate as well as nursing, discussions with consultants, evaluation of patient's response to treatment, examination of patient, obtaining history from patient or surrogate, ordering and performing treatments and interventions, ordering and review of laboratory studies, ordering and review of radiographic studies, pulse oximetry and re-evaluation of patient's condition.  Whitney D. Kenton Kingfisher, NP-C [facility] Personal contact information can be found on Amion  10/07/2021, 7:26 AM    Attending Note:  I have examined patient, reviewed labs, studies and notes.   70 year old woman with a history of an ischemic cardiomyopathy, hypertension, CKD stage IIIb and obesity.  She underwent a PEA arrest and required over 45 minutes of CPR to achieve ROSC.  Intubated, ventilated in the ED,  found to have bilateral infiltrates consistent with pulmonary edema versus less likely bilateral pneumonia.  Course complicated by severe encephalopathy, evolving acute renal failure with oliguria.  She has been treated with empiric broad-spectrum antibiotics for possible pneumonia.  Minimizing sedation but no change in her obtundation.  Vitals:   10/07/21 1128 10/07/21 1200 10/07/21 1219 10/07/21 1300  BP:      Pulse:  98  (!) 102  Resp:  20  (!) 25  Temp: 98.2 F (36.8 C) 97.9 F (36.6 C)  97.9 F (36.6  C)  TempSrc:      SpO2:  95% 95% 94%  Weight:      Height:      Obese ill-appearing woman, no distress, ventilated.  She is completely unresponsive, does not wake to voice, pain, does not move extremities.  She does breathe over the set rate on mechanical ventilation.  ET tube in place, pupils are equal.  Heart regular, distant without a murmur.  Lungs are clear bilaterally without any crackles or wheezes.  Abdomen nondistended with positive bowel sounds.  No significant edema.  Out of hospital PEA arrest in a patient with history of ischemic cardiomyopathy.  No evidence of STEMI by ECG.  Continue normothermia protocol and continuous telemetry.  Shock, likely cardiogenic shock with possible superimposed septic shock due to bilateral pneumonias.  Continue empiric antibiotics as ordered.  Wean norepinephrine as able.  Acute respiratory failure due to encephalopathy and poor airway protection, bilateral pulmonary infiltrates.  Again treating pneumonia as above.  Dose diuretics daily depending on blood pressure, renal function.  Wean PEEP and FiO2 as able.  She does not have the mental status to tolerate an extubation, protect airway.   Encephalopathy, presumed due to acute anoxic injury.  Following neurological status, try to minimize sedating medications.  We will plan to repeat MRI brain on 11/23 to look for any evidence of hypoxic injury.  Will need to discuss with family regarding prognosis.  Acute on chronic renal failure, now oliguric.  Due to ATN in the setting of cardiac arrest.  No urgent indication for hemodialysis but we will need to determine whether she is a good candidate depending on overall prognosis.  Likely review with nephrology on 11/23.  Goals of care and prognosis.  Unfortunately very low likelihood for meaningful recovery here given multisystem organ injuries in the setting of a prolonged resuscitation.  We will plan to discuss with family today 11/22.   Independent critical  care time is 34 minutes.   Baltazar Apo, MD, PhD 10/07/2021, 1:10 PM [facility] [phone] or if no answer [phone]

## 2021-10-08 ENCOUNTER — Inpatient Hospital Stay (HOSPITAL_COMMUNITY): Payer: Medicare PPO

## 2021-10-08 DIAGNOSIS — I5023 Acute on chronic systolic (congestive) heart failure: Secondary | ICD-10-CM | POA: Diagnosis not present

## 2021-10-08 DIAGNOSIS — I469 Cardiac arrest, cause unspecified: Secondary | ICD-10-CM | POA: Diagnosis not present

## 2021-10-08 LAB — RESPIRATORY PANEL BY PCR

## 2021-10-08 LAB — COMPREHENSIVE METABOLIC PANEL
ALT: 46 U/L — ABNORMAL HIGH (ref 0–44)
AST: 138 U/L — ABNORMAL HIGH (ref 15–41)
Albumin: 2.7 g/dL — ABNORMAL LOW (ref 3.5–5.0)
Alkaline Phosphatase: 153 U/L — ABNORMAL HIGH (ref 38–126)
Anion gap: 15 (ref 5–15)
BUN: 85 mg/dL — ABNORMAL HIGH (ref 8–23)
CO2: 20 mmol/L — ABNORMAL LOW (ref 22–32)
Calcium: 8.2 mg/dL — ABNORMAL LOW (ref 8.9–10.3)
Chloride: 106 mmol/L (ref 98–111)
Creatinine, Ser: 5.14 mg/dL — ABNORMAL HIGH (ref 0.44–1.00)
GFR, Estimated: 8 mL/min — ABNORMAL LOW (ref >60)
Glucose, Bld: 154 mg/dL — ABNORMAL HIGH (ref 70–99)
Potassium: 4.5 mmol/L (ref 3.5–5.1)
Sodium: 141 mmol/L (ref 135–145)
Total Bilirubin: 0.7 mg/dL (ref 0.3–1.2)
Total Protein: 6.4 g/dL — ABNORMAL LOW (ref 6.5–8.1)

## 2021-10-08 LAB — GLUCOSE, CAPILLARY
Glucose-Capillary: 111 mg/dL — ABNORMAL HIGH (ref 70–99)
Glucose-Capillary: 141 mg/dL — ABNORMAL HIGH (ref 70–99)
Glucose-Capillary: 141 mg/dL — ABNORMAL HIGH (ref 70–99)
Glucose-Capillary: 142 mg/dL — ABNORMAL HIGH (ref 70–99)
Glucose-Capillary: 146 mg/dL — ABNORMAL HIGH (ref 70–99)
Glucose-Capillary: 150 mg/dL — ABNORMAL HIGH (ref 70–99)

## 2021-10-08 LAB — CBC
HCT: 43.8 % (ref 36.0–46.0)
Hemoglobin: 13.4 g/dL (ref 12.0–15.0)
MCH: 29 pg (ref 26.0–34.0)
MCHC: 30.6 g/dL (ref 30.0–36.0)
MCV: 94.8 fL (ref 80.0–100.0)
Platelets: 249 10*3/uL (ref 150–400)
RBC: 4.62 MIL/uL (ref 3.87–5.11)
RDW: 13.7 % (ref 11.5–15.5)
WBC: 21.5 10*3/uL — ABNORMAL HIGH (ref 4.0–10.5)
nRBC: 0 % (ref 0.0–0.2)

## 2021-10-08 MED ORDER — SODIUM CHLORIDE 0.9 % IV SOLN: 3.0000 g | INTRAVENOUS | Status: DC

## 2021-10-08 MED ORDER — POLYETHYLENE GLYCOL 3350 17 G PO PACK: 17.0000 g | PACK | Freq: Every day | ORAL | Status: DC | PRN

## 2021-10-08 MED ORDER — SODIUM CHLORIDE 0.9 % IV SOLN
2.0000 g | INTRAVENOUS | Status: DC
  Administered 2021-10-08 – 2021-10-09 (×2): 2 g via INTRAVENOUS
  Filled 2021-10-08 (×2): qty 20

## 2021-10-08 MED ORDER — FENTANYL CITRATE (PF) 100 MCG/2ML IJ SOLN: 25.0000 ug | INTRAMUSCULAR | Status: DC | PRN

## 2021-10-08 MED ORDER — FENTANYL CITRATE (PF) 100 MCG/2ML IJ SOLN
25.0000 ug | INTRAMUSCULAR | Status: DC | PRN
  Administered 2021-10-09 – 2021-10-10 (×4): 100 ug via INTRAVENOUS
  Filled 2021-10-08 (×4): qty 2

## 2021-10-08 MED ORDER — DOCUSATE SODIUM 50 MG/5ML PO LIQD: 100.0000 mg | Freq: Two times a day (BID) | ORAL | Status: DC | PRN

## 2021-10-08 NOTE — Progress Notes (Signed)
NAME:  Deborah Ho, MRN:  [id number], DOB:  07-31-1951, LOS: 3 ADMISSION DATE:  10/11/2021, CONSULTATION DATE:  09/23/2021 REFERRING MD:  Tegeler, CHIEF COMPLAINT:  cardiac arrest    History of Present Illness:  History obtained from chart review and care management team.   70 yo F PMH ICM Chronic systolic HF CKD3b HTN covid infection morbid obesity who was at a bus stop complaining of not feeling well when she collapsed, witnessed. EMS called, until arrival. On arrival was pulseless and received DF x1 though initial rhythm is not known, followed by further ACLS for about 30-40 minutes on and off -- PEA throughout. Arrived to ED on [facility] device, pulseless, and achieved ROSC when intubated by EDP. The patient has received no sedation or RSI medications.  ECG did not show STEMI  Patient was notably recently admitted (07/2021) with hypoxic respiratory failure with pulmonary edema requiring mechanical ventilation due to acute exacerbation of systolic HF. At that time, EF 20-25%)   Pertinent  Medical History  HTN HLD Morbid obesity ICM CKD 3b   Significant Hospital Events: Including procedures, antibiotic start and stop dates in addition to other pertinent events   11/20 presented to ED actively receiving CPR after witnessed collapse at bus stop. Downtime 30 min, did briefly achieve ROSC a few times in field.  11/21 remains intubated, off sedation this AM with poor neuro exam, no pressor requirement 11/22 Urine output dropped off significantly overnight with increased creatinine of 3.90 and BUN 50 11/23 worsening multisystem organ dysfunction including neurological, renal, and liver injuries.  Mentation also decreased but patent was on low dose fentanyl drip   Imaging 11/20 CTH There is new area of low-density in the left posterior parietal cortex suggesting age indeterminate infarction. 11/20 CTA Chest No PE, ground-glass and alveolar infiltrates in both lungs with overall worsening.  Findings suggest bilateral pneumonia. Small pleural effusions, more so on the right side.  Tubes/Lines 11/20 Foley > 11/20 Aline > 11/21 ETT >  Abx Cefepime 11/21- Vancomycin 11/21-  Interim History / Subjective:  Decreased urine out overnight   Objective   Blood pressure 110/61, pulse 98, temperature 98.4 F (36.9 C), resp. rate (!) 25, height 5\' 7"  (1.702 m), weight 112.8 kg, SpO2 94 %.    Vent Mode: PRVC FiO2 (%):  [30 %] 30 % Set Rate:  [28 bmp] 28 bmp Vt Set:  [400 mL] 400 mL PEEP:  [5 cmH20] 5 cmH20 Pressure Support:  [10 cmH20] 10 cmH20 Plateau Pressure:  [12 cmH20-14 cmH20] 14 cmH20   Intake/Output Summary (Last 24 hours) at 10/08/2021 09/21/2021 Last data filed at 10/08/2021 09/19/2021 Gross per 24 hour  Intake 922.08 ml  Output 195 ml  Net 727.08 ml    Filed Weights   10/06/21 0500 10/07/21 0500 10/08/21 0118  Weight: 110 kg 108.9 kg 112.8 kg    Physical exam  General: Acute on chronically ill appearing elderly female lying in bed on mechanical ventilation, in NAD HEENT: ETT, MM pink/moist, PERRL,  Neuro: Non-responsive on vent, pupils sluggish  CV: s1s2 regular rate and rhythm, no murmur, rubs, or gallops,  PULM:  Clear bilateral, no increased work of breathing, tolerating vent with spontaneous breaths  GI: soft, bowel sounds active in all 4 quadrants, non-tender, non-distended, tolerating TF Extremities: warm/dry, no edema  Skin: no rashes or lesions  Resolved Hospital Problem list     Assessment & Plan:  Out of hospital Cardiac Arrest with subsequent acute hypoxic/hypercarbic respiratory failure and encephalopathy -  Initial rhythm not known, did get DF x1 but was PEA for all efforts with EMS and ED. Down for 30-40 min total, brief periods of ROSC -Unclear etiology at this time, though patient with bilateral PNA, per notes complained of "not feeling well" prior to arrest --pt was in unknown rhythm, but was shockable, trop 297 yesterday, EKG not suggestive of  STEMI, continue to trend trop Hx HFrEF with EF 20-25%  -ECHO 10/14/2021 with EF 20-25% with global hypokinesis, similar EF on ECHO 07/2021 P: Continuous telemetry  Normothermia  Strict intake and output  Daily weight to assess volume status Closely monitor renal function and electrolytes  Vent support as below  Ensure hemodynamic control  Pressors as needed for MAP goal > 65  At risk anoxic injury  -CT with possible L parietal indeterminate infarction, will need MRI P: MRI brain pending  Spot EEG negative for acute seizures  Maintain neuro protective measures; goal for eurothermia, euglycemia, eunatermia, normoxia, and PCO2 goal of 35-40 Nutrition and bowel regiment  Seizure precautions  Aspirations precautions   Acute Hypoxic / Hypercapnic Respiratory Failure  -In the setting of cardiac arrest  Bilateral PNA, CAP vs. Possible aspiration Pulmonary edema P: Continue ventilator support with lung protective strategies  Wean PEEP and FiO2 for sats greater than 90%. Head of bed elevated 30 degrees. Plateau pressures less than 30 cm H20.  Follow intermittent chest x-ray and ABG.   SAT/SBT as tolerated, mentation preclude extubation  Ensure adequate pulmonary hygiene  Follow cultures  VAP bundle in place  PAD protocol Empiric Cefepime and vancomycin   Acute on Chronic Kidney injury Hypophosphatemia  -Creatinine on admission 2.5, baseline appears to be 1.7 -Suspect ATN in the setting of 30 minutes cardiac arrest P: Creatinine continue to rise with poor urine output Will likely need to make a decision on hemodialysis soon, no immediate need for dialysis currently  Follow renal function  Monitor urine output Trend Bmet Avoid nephrotoxins Ensure adequate renal perfusion   Hyperglycemia P: Continue SSI  CBG q4hrs  CBG goal 140-180  Elevated LFTs -Seems like this has been a chronic issue in review of records P: LFTs slightly elevated likely secondary to shock liver   Avoid hepatotoxins  Trend LFTs  Best Practice (right click and "Reselect all SmartList Selections" daily)   Diet/type: tubefeeds DVT prophylaxis: prophylactic heparin  GI prophylaxis: PPI Lines: N/A Foley:  Yes, and it is still needed Code Status:  full code Last date of multidisciplinary goals of care discussion [unable to reach family]  Critical care time:   CRITICAL CARE Performed by: Asako Saliba D. Harris  Total critical care time: 40 minutes  Critical care time was exclusive of separately billable procedures and treating other patients.  Critical care was necessary to treat or prevent imminent or life-threatening deterioration.  Critical care was time spent personally by me on the following activities: development of treatment plan with patient and/or surrogate as well as nursing, discussions with consultants, evaluation of patient's response to treatment, examination of patient, obtaining history from patient or surrogate, ordering and performing treatments and interventions, ordering and review of laboratory studies, ordering and review of radiographic studies, pulse oximetry and re-evaluation of patient's condition.  Winefred Hillesheim D. Tiburcio Pea, NP-C [facility] Personal contact information can be found on Amion  10/08/2021, 8:03 AM

## 2021-10-08 NOTE — Progress Notes (Signed)
  Interdisciplinary Goals of Care Family Meeting   Date carried out:: 10/08/2021  Location of the meeting: Phone conference  Member's involved: Nurse Practitioner and Family Member or next of kin  Durable Power of Attorney or acting medical decision maker: Shared amongst family  Discussion: We discussed goals of care for Deborah Ho .   I called and spoke to Deborah Ho (niece) and Deborah Ho (sister) regarding MRI results.  Family was updated that MRI is consistent with severe anoxic injury this coupled with multisystem organ dysfunction and worsening neurological exam has family strongly considering comfort care.  Patient's Sister Deborah Ho would like to be the primary person of contact for the family as she has a medical background.  Family would like to discuss amongst themselves and come together to develop best goals of care moving forward.  They request a formal goals of care meeting with clinical team either Friday or Saturday to determine next steps.  Palliative care consult was placed.  The family is hopeful that patient could tolerate ventilator wean with overall goal of one-way extubation.  If patient were to fail extubation attempt family would likely move towards comfort.  Code status: Limited Code or DNR with likely transition to comfort care soon  Disposition: Continue current acute care  Time spent for the meeting: 30   Deborah Ho D. Tiburcio Pea, NP-C [facility] Personal contact information can be found on Amion  10/08/2021, 8:13 PM

## 2021-10-08 NOTE — Progress Notes (Signed)
Pharmacist Heart Failure Core Measure Documentation  Assessment: Deborah Ho has an EF documented as 20-25% on 10/01/2021 by Dina Rich.  Rationale: Heart failure patients with left ventricular systolic dysfunction (LVSD) and an EF < 40% should be prescribed an angiotensin converting enzyme inhibitor (ACEI) or angiotensin receptor blocker (ARB) at discharge unless a contraindication is documented in the medical record.  This patient is not currently on an ACEI or ARB for HF.  This note is being placed in the record in order to provide documentation that a contraindication to the use of these agents is present for this encounter.  ACE Inhibitor or Angiotensin Receptor Blocker is contraindicated (specify all that apply)  []   ACEI allergy AND ARB allergy []   Angioedema []   Moderate or severe aortic stenosis []   Hyperkalemia []   Hypotension []   Renal artery stenosis [x]   Worsening renal function, preexisting renal disease or dysfunction   [name] 10/08/2021 11:38 AM

## 2021-10-08 NOTE — Progress Notes (Signed)
Pt transported from 3M09 to MRI 1 and back on the ventilator without complication. RT, RN and transport accompanied pt. VSS throughout. RT will continue to monitor and be available as needed.

## 2021-10-09 DIAGNOSIS — I5023 Acute on chronic systolic (congestive) heart failure: Secondary | ICD-10-CM | POA: Diagnosis not present

## 2021-10-09 DIAGNOSIS — I469 Cardiac arrest, cause unspecified: Secondary | ICD-10-CM | POA: Diagnosis not present

## 2021-10-09 DIAGNOSIS — I10 Essential (primary) hypertension: Secondary | ICD-10-CM | POA: Diagnosis not present

## 2021-10-09 DIAGNOSIS — N289 Disorder of kidney and ureter, unspecified: Secondary | ICD-10-CM

## 2021-10-09 DIAGNOSIS — G9340 Encephalopathy, unspecified: Secondary | ICD-10-CM | POA: Diagnosis not present

## 2021-10-09 LAB — GLUCOSE, CAPILLARY
Glucose-Capillary: 155 mg/dL — ABNORMAL HIGH (ref 70–99)
Glucose-Capillary: 137 mg/dL — ABNORMAL HIGH (ref 70–99)
Glucose-Capillary: 133 mg/dL — ABNORMAL HIGH (ref 70–99)
Glucose-Capillary: 130 mg/dL — ABNORMAL HIGH (ref 70–99)
Glucose-Capillary: 144 mg/dL — ABNORMAL HIGH (ref 70–99)
Glucose-Capillary: 126 mg/dL — ABNORMAL HIGH (ref 70–99)

## 2021-10-09 MED ORDER — IPRATROPIUM-ALBUTEROL 0.5-2.5 (3) MG/3ML IN SOLN: 3.0000 mL | Freq: Four times a day (QID) | RESPIRATORY_TRACT | Status: DC | PRN

## 2021-10-09 NOTE — Progress Notes (Signed)
NAME:  Deborah Ho, MRN:  [id number], DOB:  07-29-1951, LOS: 4 ADMISSION DATE:  09/20/2021, CONSULTATION DATE:  11/24 REFERRING MD:  Tegeler, CHIEF COMPLAINT:  cardiac arrest   History of Present Illness:  71 y/o female with a witnessed cardiac arrest at a bus stop. Received CPR for 30-40 minutes, on defibrillating shock.  Had been previously admitted in 07/2021 with acute respiratory failure due to acute systolic heart failure.   Pertinent  Medical History  Ischemic cardiomyopathy Hypertension CKD stage 3b obesity  Significant Hospital Events: Including procedures, antibiotic start and stop dates in addition to other pertinent events   11/20 presented to ED actively receiving CPR after witnessed collapse at bus stop. Downtime 30 min, did briefly achieve ROSC a few times in field.  11/21 remains intubated, off sedation this AM with poor neuro exam, no pressor requirement 11/22 Urine output dropped off significantly overnight with increased creatinine of 3.90 and BUN 50 11/23 worsening multisystem organ dysfunction including neurological, renal, and liver injuries.  Mentation also decreased but patent was on low dose fentanyl drip   Imaging 11/20 CTH There is new area of low-density in the left posterior parietal cortex suggesting age indeterminate infarction. 11/20 CTA Chest No PE, ground-glass and alveolar infiltrates in both lungs with overall worsening. Findings suggest bilateral pneumonia. Small pleural effusions, more so on the right side. 11/23 MRI Brain > findings consistent with anoxic brain injury  Tubes/Lines 11/20 Foley > 11/20 Aline > 11/21 ETT >   Abx Cefepime 11/21>11/22 Ceftriaxone 11/23 > Vancomycin 11/21> x1  Micro 11/20 RVP > negative 11/20 blood > ngtd 11/20 sars cov 2/flu > neg  Interim History / Subjective:  Renal function works Liver function worse Remains unresponsive Remains on mechanical ventilation fever  Objective   Blood pressure (!)  128/56, pulse 94, temperature 100.2 F (37.9 C), temperature source Bladder, resp. rate (!) 30, height 5\' 7"  (1.702 m), weight 108 kg, SpO2 98 %.    Vent Mode: PRVC FiO2 (%):  [30 %-40 %] 40 % Set Rate:  [28 bmp] 28 bmp Vt Set:  [400 mL] 400 mL PEEP:  [5 cmH20] 5 cmH20 Pressure Support:  [8 cmH20-10 cmH20] 8 cmH20 Plateau Pressure:  [18 cmH20] 18 cmH20   Intake/Output Summary (Last 24 hours) at 10/09/2021 0817 Last data filed at 10/09/2021 0600 Gross per 24 hour  Intake 1451.67 ml  Output 996 ml  Net 455.67 ml   Filed Weights   10/07/21 0500 10/08/21 0118 10/09/21 0354  Weight: 108.9 kg 112.8 kg 108 kg    Examination: General:  In bed on vent HENT: NCAT ETT in place PULM: CTA B, vent supported breathing CV: RRR, no mgr GI: BS+, soft, nontender MSK: normal bulk and tone Neuro: no cough or gag to suctioning, no motor response to painful stimuli, no eye opening  11/21 CXR > lungs clear, no infiltrate  Resolved Hospital Problem list     Assessment & Plan:  Acute respiratory failure with hypoxemia possibly due to community acquired pneumonia Full mechanical vent support VAP prevention Daily WUA/SBT as mental status allows Plan 5 days of antibiotics (day 4 today)  Out of hospital cardiac arrest, possibly due to pneumonia Acute metabolic encephalopathy Anoxic brain injury Hold sedatives as much as possible Try to avoid fever> ice packs if fever  AKI > worsening, not a candidate for hemodialysis Monitor BMET and UOP Replace electrolytes as needed  Hyperglycemia SSI q4h and accuchecks  Shock liver> worse Monitor LFT daily  Code  status : no CPR in the event of a cardiac arrest Goals of care: planning follow up discussion on 11/25; I called her brother today > no answer  Best Practice (right click and "Reselect all SmartList Selections" daily)   Diet/type: tubefeeds DVT prophylaxis: prophylactic heparin  GI prophylaxis: PPI Lines: N/A Foley:  Yes, and it is  still needed Code Status:  DNR Last date of multidisciplinary goals of care discussion [11/23]  Labs   CBC: Recent Labs  Lab 09/16/2021 1008 09/26/2021 1013 10/06/21 0427 10/06/21 0503 10/06/21 0842 10/06/21 1531 10/07/21 0044 10/07/21 0432 10/08/21 0503  WBC 26.6*  --  28.5*  --   --   --   --  25.1* 21.5*  NEUTROABS 18.1*  --   --   --   --   --   --   --   --   HGB 13.5   < > 13.7   < > 13.9 15.0 13.9 13.7 13.4  HCT 46.1*   < > 41.7   < > 41.0 44.0 41.0 44.1 43.8  MCV 100.2*  --  88.0  --   --   --   --  92.6 94.8  PLT 257  --  257  --   --   --   --  253 249   < > = values in this interval not displayed.    Basic Metabolic Panel: Recent Labs  Lab 10/06/2021 1008 09/17/2021 1013 10/08/2021 1124 10/08/2021 2056 10/06/21 0445 10/06/21 0503 10/06/21 0842 10/06/21 1037 10/06/21 1531 10/06/21 1649 10/07/21 0044 10/07/21 0432 10/07/21 2154 10/08/21 0503  NA 136 140   < > 143 142   < > 140  --  142  --  144 141  --  141  K 4.1 4.1   < > 4.2 3.6   < > 4.6  --  3.8  --  3.6 3.8  --  4.5  CL 107 109  --  109 106  --   --   --   --   --   --  109  --  106  CO2 14*  --   --  15* 21*  --   --   --   --   --   --  19*  --  20*  GLUCOSE 282* 275*  --  192* 132*  --   --   --   --   --   --  168*  --  154*  BUN 17 18  --  24* 28*  --   --   --   --   --   --  50*  --  85*  CREATININE 1.92* 1.70*  --  2.13* 2.52*  --   --   --   --   --   --  3.90*  --  5.14*  CALCIUM 8.2*  --   --  8.5* 8.5*  --   --   --   --   --   --  8.2*  --  8.2*  MG 2.4  --   --   --  1.7  --   --  2.3  --  2.5*  --  2.5* 2.7*  --   PHOS  --   --   --   --  1.7*  --   --  4.5  --  4.0  --  5.2* 6.9*  --    < > = values in  this interval not displayed.   GFR: Estimated Creatinine Clearance: 12.9 mL/min (A) (by C-G formula based on SCr of 5.14 mg/dL (H)). Recent Labs  Lab 10/08/2021 1008 10/06/21 0427 10/06/21 1026 10/06/21 1040 10/07/21 0432 10/08/21 0503  PROCALCITON 0.10  --  39.98  --  44.68  --   WBC  26.6* 28.5*  --   --  25.1* 21.5*  LATICACIDVEN  --   --   --  1.6  --   --     Liver Function Tests: Recent Labs  Lab 10/08/2021 1008 10/06/21 0445 10/07/21 0432 10/08/21 0503  AST 54* 57* 61* 138*  ALT 32 43 37 46*  ALKPHOS 174* 146* 133* 153*  BILITOT 0.7 0.9 0.5 0.7  PROT 5.8* 6.0* 6.1* 6.4*  ALBUMIN 2.8* 2.8* 2.7* 2.7*   No results for input(s): LIPASE, AMYLASE in the last 168 hours. No results for input(s): AMMONIA in the last 168 hours.  ABG    Component Value Date/Time   PHART 7.380 10/07/2021 0044   PCO2ART 33.5 10/07/2021 0044   PO2ART 132 (H) 10/07/2021 0044   HCO3 19.8 (L) 10/07/2021 0044   TCO2 21 (L) 10/07/2021 0044   ACIDBASEDEF 4.0 (H) 10/07/2021 0044   O2SAT 99.0 10/07/2021 0044     Coagulation Profile: Recent Labs  Lab 09/18/2021 1008  INR 1.2    Cardiac Enzymes: No results for input(s): CKTOTAL, CKMB, CKMBINDEX, TROPONINI in the last 168 hours.  HbA1C: Hgb A1c MFr Bld  Date/Time Value Ref Range Status  08/06/2021 01:59 PM 5.8 (H) 4.8 - 5.6 % Final    Comment:    (NOTE)         Prediabetes: 5.7 - 6.4         Diabetes: >6.4         Glycemic control for adults with diabetes: <7.0   09/29/2017 04:15 AM 6.2 (H) 4.8 - 5.6 % Final    Comment:    (NOTE) Pre diabetes:          5.7%-6.4% Diabetes:              >6.4% Glycemic control for   <7.0% adults with diabetes     CBG: Recent Labs  Lab 10/08/21 1507 10/08/21 1955 10/08/21 2352 10/09/21 0336 10/09/21 0731  GLUCAP 150* 142* 141* 155* 137*    Critical care time: 31 minutes     Roselie Awkward, MD [place] [facility] Pager: [phone] Cell: [phone] After 7:00 pm call Elink  [phone]

## 2021-10-10 DIAGNOSIS — Z7189 Other specified counseling: Secondary | ICD-10-CM

## 2021-10-10 DIAGNOSIS — I469 Cardiac arrest, cause unspecified: Secondary | ICD-10-CM | POA: Diagnosis not present

## 2021-10-10 DIAGNOSIS — G9340 Encephalopathy, unspecified: Secondary | ICD-10-CM | POA: Diagnosis not present

## 2021-10-10 DIAGNOSIS — N289 Disorder of kidney and ureter, unspecified: Secondary | ICD-10-CM | POA: Diagnosis not present

## 2021-10-10 DIAGNOSIS — I10 Essential (primary) hypertension: Secondary | ICD-10-CM | POA: Diagnosis not present

## 2021-10-10 DIAGNOSIS — I5023 Acute on chronic systolic (congestive) heart failure: Secondary | ICD-10-CM | POA: Diagnosis not present

## 2021-10-10 DIAGNOSIS — Z515 Encounter for palliative care: Secondary | ICD-10-CM

## 2021-10-10 DIAGNOSIS — Z66 Do not resuscitate: Secondary | ICD-10-CM

## 2021-10-10 LAB — COMPREHENSIVE METABOLIC PANEL
ALT: 34 U/L (ref 0–44)
AST: 62 U/L — ABNORMAL HIGH (ref 15–41)
Albumin: 2.1 g/dL — ABNORMAL LOW (ref 3.5–5.0)
Alkaline Phosphatase: 141 U/L — ABNORMAL HIGH (ref 38–126)
Anion gap: 18 — ABNORMAL HIGH (ref 5–15)
BUN: 157 mg/dL — ABNORMAL HIGH (ref 8–23)
CO2: 19 mmol/L — ABNORMAL LOW (ref 22–32)
Calcium: 7.7 mg/dL — ABNORMAL LOW (ref 8.9–10.3)
Chloride: 104 mmol/L (ref 98–111)
Creatinine, Ser: 8.47 mg/dL — ABNORMAL HIGH (ref 0.44–1.00)
GFR, Estimated: 5 mL/min — ABNORMAL LOW (ref >60)
Glucose, Bld: 149 mg/dL — ABNORMAL HIGH (ref 70–99)
Potassium: 5 mmol/L (ref 3.5–5.1)
Sodium: 141 mmol/L (ref 135–145)
Total Bilirubin: 0.6 mg/dL (ref 0.3–1.2)
Total Protein: 5.9 g/dL — ABNORMAL LOW (ref 6.5–8.1)

## 2021-10-10 LAB — CULTURE, BLOOD (ROUTINE X 2)
Culture: NO GROWTH
Culture: NO GROWTH
Special Requests: ADEQUATE

## 2021-10-10 LAB — GLUCOSE, CAPILLARY
Glucose-Capillary: 124 mg/dL — ABNORMAL HIGH (ref 70–99)
Glucose-Capillary: 134 mg/dL — ABNORMAL HIGH (ref 70–99)

## 2021-10-10 MED ORDER — GLYCOPYRROLATE 0.2 MG/ML IJ SOLN: 0.2000 mg | INTRAMUSCULAR | Status: DC | PRN

## 2021-10-10 MED ORDER — POLYVINYL ALCOHOL 1.4 % OP SOLN
1.0000 [drp] | Freq: Four times a day (QID) | OPHTHALMIC | Status: DC | PRN
  Filled 2021-10-10: qty 15

## 2021-10-10 MED ORDER — MORPHINE 100MG IN NS 100ML (1MG/ML) PREMIX INFUSION
5.0000 mg/h | INTRAVENOUS | Status: DC
  Filled 2021-10-10: qty 100

## 2021-10-10 MED ORDER — MORPHINE BOLUS VIA INFUSION
5.0000 mg | INTRAVENOUS | Status: DC | PRN
  Filled 2021-10-10: qty 5

## 2021-10-10 MED ORDER — ACETAMINOPHEN 650 MG RE SUPP: 650.0000 mg | Freq: Four times a day (QID) | RECTAL | Status: DC | PRN

## 2021-10-10 MED ORDER — GLYCOPYRROLATE 1 MG PO TABS: 1.0000 mg | ORAL_TABLET | ORAL | Status: DC | PRN

## 2021-10-10 MED ORDER — MORPHINE 100MG IN NS 100ML (1MG/ML) PREMIX INFUSION
5.0000 mg/h | INTRAVENOUS | Status: DC
  Administered 2021-10-10: 5 mg/h via INTRAVENOUS

## 2021-10-10 MED ORDER — MORPHINE SULFATE (PF) 2 MG/ML IV SOLN
2.0000 mg | INTRAVENOUS | Status: DC | PRN
  Administered 2021-10-10: 2 mg via INTRAVENOUS

## 2021-10-10 MED ORDER — DIPHENHYDRAMINE HCL 50 MG/ML IJ SOLN: 25.0000 mg | INTRAMUSCULAR | Status: DC | PRN

## 2021-10-10 MED ORDER — DEXTROSE 5 % IV SOLN: INTRAVENOUS | Status: DC

## 2021-10-10 MED ORDER — ACETAMINOPHEN 325 MG PO TABS: 650.0000 mg | ORAL_TABLET | Freq: Four times a day (QID) | ORAL | Status: DC | PRN

## 2021-10-13 ENCOUNTER — Encounter (INDEPENDENT_AMBULATORY_CARE_PROVIDER_SITE_OTHER): Payer: Self-pay | Admitting: Primary Care

## 2021-10-16 NOTE — Consult Note (Signed)
Consultation Note Date: 09/24/2021   Patient Name: Deborah Ho  DOB: December 06, 1950  MRN: [id number]  Age / Sex: 70 y.o., female  PCP: Grayce Sessions, NP Referring Physician: Lupita Leash, MD  Reason for Consultation: Establishing goals of care  HPI/Patient Profile: 70 y.o. female  with past medical history of hypertension, CKD, obesity, and ischemic cardiomyopathy admitted on 2021-11-03 with after witnessed cardiac arrest at a bus stop.  Patient received CPR for 30 to 40 minutes.  MRI of brain revealed anoxic brain injury.  Family members have decided to move forward with a one-way comfort extubation.  Palliative medicine team was consulted to support the family and end-of-life care.  Clinical Assessment and Goals of Care: I have reviewed medical records including EPIC notes, labs and imaging, assessed the patient and then spoke with the patient's Sister Carley Hammed over the phone to discuss diagnosis prognosis, GOC, EOL wishes, disposition and options.  I introduced Palliative Medicine as specialized medical care for people living with serious illness. It focuses on providing relief from the symptoms and stress of a serious illness. The goal is to improve quality of life for both the patient and the family.  We discussed patient's current illness and what it means in the larger context of patient's on-going co-morbidities.  Natural disease trajectory and expectations at EOL were discussed.   Education offered regarding concept specific to human mortality and the limitations of medical interventions to prolong life when the brain has suffered an anoxic injury.  He was shared that the family is all in agreement that they do not want to have their final memories with Imaya of her being sick and dying in the hospital.  No family plans to visit however Carley Hammed was very concerned about 1 sister that lives in [place]  DC.  Carley Hammed asked that I reach out to the sister perhaps this afternoon or tomorrow to help her better understand that the end-of-life decisions have been made with the patient's best interest in mind.  Apparently the sister is having difficulty accepting that family does not want to sit vigil since they do not want their memories to be of the patient intubated and sedated.   Offer my support to both Carley Hammed and her siblings.  Siblings name is Webb Silversmith.  Carley Hammed said she would reach back out to me with her contact information when she feels it is appropriate time to speak with her.  Educated Carley Hammed that PMT is not only an extra layer support but also experts in symptom management.  We will continue to monitor the patient closely throughout her hospitalization to ensure her end-of-life and transitioning is is pain-free and free of suffering is possible.   Questions and concerns were addressed. The family was encouraged to call with questions or concerns.   Primary Decision Maker NEXT OF KIN  Code Status/Advance Care Planning: DNR  Prognosis:   Hours - Days  Discharge Planning: Anticipated Hospital Death  Primary Diagnoses: Present on Admission:  Cardiac arrest [facility])   Physical Exam  Constitutional:      General: She is not in acute distress.    Appearance: She is not toxic-appearing.  HENT:     Head: Normocephalic and atraumatic.     Mouth/Throat:     Mouth: Mucous membranes are dry.  Cardiovascular:     Rate and Rhythm: Normal rate.     Pulses: Normal pulses.  Pulmonary:     Effort: Pulmonary effort is normal.     Comments: MV Abdominal:     Palpations: Abdomen is soft.  Musculoskeletal:     Comments: Bedbound  Neurological:     Comments: Sedated and intubated    Vital Signs: BP (!) 138/49   Pulse 85   Temp (!) 100.4 F (38 C)   Resp (!) 25   Ht 5\' 7"  (1.702 m)   Wt 108 kg   SpO2 99%   BMI 37.29 kg/m  Pain Scale: CPOT   Pain Score: 0-No pain SpO2: SpO2: 99 % O2  Device:SpO2: 99 % O2 Flow Rate: .   Palliative Assessment/Data: 10%     I discussed this patient's plan of care with Dr. , patient's sister Kendrick Fries, nursing.  Thank you for this consult. Palliative medicine will continue to follow and assist holistically.   Time Total: 70 minutes Greater than 50%  of this time was spent counseling and coordinating care related to the above assessment and plan.  Signed by: Carley Hammed, DNP, FNP-BC Palliative Medicine    Please contact Palliative Medicine Team phone at [phone] for questions and concerns.  For individual provider: See [phone]

## 2021-10-16 NOTE — Progress Notes (Signed)
NAME:  Deborah Ho, MRN:  [id number], DOB:  06/01/1951, LOS: 5 ADMISSION DATE:  09/27/2021, CONSULTATION DATE:  11/24 REFERRING MD:  Tegeler, CHIEF COMPLAINT:  cardiac arrest   History of Present Illness:  70 y/o female with a witnessed cardiac arrest at a bus stop. Received CPR for 30-40 minutes, on defibrillating shock.  Had been previously admitted in 07/2021 with acute respiratory failure due to acute systolic heart failure.   Pertinent  Medical History  Ischemic cardiomyopathy Hypertension CKD stage 3b Obesity  Significant Hospital Events: Including procedures, antibiotic start and stop dates in addition to other pertinent events   11/20 presented to ED actively receiving CPR after witnessed collapse at bus stop. Downtime 30 min, did briefly achieve ROSC a few times in field.  11/21 remains intubated, off sedation this AM with poor neuro exam, no pressor requirement 11/22 Urine output dropped off significantly overnight with increased creatinine of 3.90 and BUN 50 11/23 worsening multisystem organ dysfunction including neurological, renal, and liver injuries.  Mentation also decreased but patent was on low dose fentanyl drip   Imaging 11/20 CTH There is new area of low-density in the left posterior parietal cortex suggesting age indeterminate infarction. 11/20 CTA Chest No PE, ground-glass and alveolar infiltrates in both lungs with overall worsening. Findings suggest bilateral pneumonia. Small pleural effusions, more so on the right side. 11/23 MRI Brain > findings consistent with anoxic brain injury  Tubes/Lines 11/20 Foley > 11/20 Aline > 11/21 ETT >   Abx Cefepime 11/21>11/22 Ceftriaxone 11/23 > Vancomycin 11/21> x1  Micro 11/20 RVP > negative 11/20 blood > ngtd 11/20 sars cov 2/flu > neg  Interim History / Subjective:   Gasping for air on ventilator this morning No acute events  Objective   Blood pressure (!) 138/49, pulse 82, temperature 100 F (37.8 C),  temperature source Bladder, resp. rate (!) 30, height 5\' 7"  (1.702 m), weight 108 kg, SpO2 98 %.    Vent Mode: PRVC FiO2 (%):  [40 %] 40 % Set Rate:  [28 bmp] 28 bmp Vt Set:  [400 mL] 400 mL PEEP:  [5 cmH20] 5 cmH20 Pressure Support:  [10 cmH20] 10 cmH20 Plateau Pressure:  [18 cmH20] 18 cmH20   Intake/Output Summary (Last 24 hours) at 11-07-2021 0732 Last data filed at November 07, 2021 0600 Gross per 24 hour  Intake 1760 ml  Output 685 ml  Net 1075 ml   Filed Weights   10/07/21 0500 10/08/21 0118 10/09/21 0354  Weight: 108.9 kg 112.8 kg 108 kg    Examination:  General:  In bed on vent HENT: NCAT ETT in place PULM: CTA B, vent supported breathing CV: RRR, no mgr GI: BS+, soft, nontender MSK: normal bulk and tone Neuro: no motor or eye opening response to noxious stimuli   11/21 CXR > lungs clear, no infiltrate  Resolved Hospital Problem list     Assessment & Plan:  Acute respiratory failure with hypoxemia possibly due to community acquired pneumonia > worsening oxygenation due to pneumonia and acute pulmonary  Plan extubation today on morphine infusion No reintubation Stop antibiotics today  Out of hospital cardiac arrest, possibly due to pneumonia Acute metabolic encephalopathy Anoxic brain injury > no improvement 10/25 Use morphine for comfort  AKI > worsening, not a candidate for hemodialysis No mor labs  Hyperglycemia Minimize sticks, medical interventions  Shock liver> worse Hold further labs  Code status: no CPR in the event of a cardiac arrest  Goals of care: planning extubation today, using  morphine for relief of dyspnea, with understanding that the patient will likely die a natural death in hospital. Discussed with Ernestene Kiel (sister) today.  Best Practice (right click and "Reselect all SmartList Selections" daily)   Diet/type: tubefeeds DVT prophylaxis: prophylactic heparin  GI prophylaxis: PPI Lines: N/A Foley:  Yes, and it is still  needed Code Status:  DNR Last date of multidisciplinary goals of care discussion [11/25]  Labs   CBC: Recent Labs  Lab 09/23/2021 1008 10/09/2021 1013 10/06/21 0427 10/06/21 0503 10/06/21 0842 10/06/21 1531 10/07/21 0044 10/07/21 0432 10/08/21 0503  WBC 26.6*  --  28.5*  --   --   --   --  25.1* 21.5*  NEUTROABS 18.1*  --   --   --   --   --   --   --   --   HGB 13.5   < > 13.7   < > 13.9 15.0 13.9 13.7 13.4  HCT 46.1*   < > 41.7   < > 41.0 44.0 41.0 44.1 43.8  MCV 100.2*  --  88.0  --   --   --   --  92.6 94.8  PLT 257  --  257  --   --   --   --  253 249   < > = values in this interval not displayed.    Basic Metabolic Panel: Recent Labs  Lab 10/01/2021 2056 10/06/21 0445 10/06/21 0503 10/06/21 1037 10/06/21 1531 10/06/21 1649 10/07/21 0044 10/07/21 0432 10/07/21 2154 10/08/21 0503 Nov 02, 2021 0514  NA 143 142   < >  --  142  --  144 141  --  141 141  K 4.2 3.6   < >  --  3.8  --  3.6 3.8  --  4.5 5.0  CL 109 106  --   --   --   --   --  109  --  106 104  CO2 15* 21*  --   --   --   --   --  19*  --  20* 19*  GLUCOSE 192* 132*  --   --   --   --   --  168*  --  154* 149*  BUN 24* 28*  --   --   --   --   --  50*  --  85* 157*  CREATININE 2.13* 2.52*  --   --   --   --   --  3.90*  --  5.14* 8.47*  CALCIUM 8.5* 8.5*  --   --   --   --   --  8.2*  --  8.2* 7.7*  MG  --  1.7  --  2.3  --  2.5*  --  2.5* 2.7*  --   --   PHOS  --  1.7*  --  4.5  --  4.0  --  5.2* 6.9*  --   --    < > = values in this interval not displayed.   GFR: Estimated Creatinine Clearance: 7.8 mL/min (A) (by C-G formula based on SCr of 8.47 mg/dL (H)). Recent Labs  Lab 10/02/2021 1008 10/06/21 0427 10/06/21 1026 10/06/21 1040 10/07/21 0432 10/08/21 0503  PROCALCITON 0.10  --  39.98  --  44.68  --   WBC 26.6* 28.5*  --   --  25.1* 21.5*  LATICACIDVEN  --   --   --  1.6  --   --  Liver Function Tests: Recent Labs  Lab 10/06/21 1008 10/06/21 0445 10/07/21 0432 10/08/21 0503  10/12/2021 0514  AST 54* 57* 61* 138* 62*  ALT 32 43 37 46* 34  ALKPHOS 174* 146* 133* 153* 141*  BILITOT 0.7 0.9 0.5 0.7 0.6  PROT 5.8* 6.0* 6.1* 6.4* 5.9*  ALBUMIN 2.8* 2.8* 2.7* 2.7* 2.1*   No results for input(s): LIPASE, AMYLASE in the last 168 hours. No results for input(s): AMMONIA in the last 168 hours.  ABG    Component Value Date/Time   PHART 7.380 10/07/2021 0044   PCO2ART 33.5 10/07/2021 0044   PO2ART 132 (H) 10/07/2021 0044   HCO3 19.8 (L) 10/07/2021 0044   TCO2 21 (L) 10/07/2021 0044   ACIDBASEDEF 4.0 (H) 10/07/2021 0044   O2SAT 99.0 10/07/2021 0044     Coagulation Profile: Recent Labs  Lab 10-06-21 1008  INR 1.2    Cardiac Enzymes: No results for input(s): CKTOTAL, CKMB, CKMBINDEX, TROPONINI in the last 168 hours.  HbA1C: Hgb A1c MFr Bld  Date/Time Value Ref Range Status  08/06/2021 01:59 PM 5.8 (H) 4.8 - 5.6 % Final    Comment:    (NOTE)         Prediabetes: 5.7 - 6.4         Diabetes: >6.4         Glycemic control for adults with diabetes: <7.0   09/29/2017 04:15 AM 6.2 (H) 4.8 - 5.6 % Final    Comment:    (NOTE) Pre diabetes:          5.7%-6.4% Diabetes:              >6.4% Glycemic control for   <7.0% adults with diabetes     CBG: Recent Labs  Lab 10/09/21 1526 10/09/21 1951 10/09/21 2317 09/26/2021 0330 10/11/2021 0717  GLUCAP 130* 144* 126* 124* 134*    Critical care time: 35 minutes     Heber Northwood, MD  PCCM Pager: [phone] Cell: [phone] After 7:00 pm call Elink  [phone]

## 2021-10-16 NOTE — Progress Notes (Addendum)
Phone call with Rosanne Sack and Carley Hammed (sisters), discussing plan of care and goals for their sister.  Family discussions have provided a clear path forward, they understand her patient status and would like to move forward with focused care.  Verbatim from the family members on the conference call: " No further interventions: No dialysis, no antibiotics, no tube feeds, no trach. Comfort is important to the family, wean the ventilator per protocol, and she is a full DNI/DNR."  The family discussed that they do not need a visit for closure, but instead will celebrate the life that was. Family is aware of morphine being given for comfort measures during this transition. Family members will call back later this afternoon for an update, and they verbalize their understanding that the patient is likely to pass naturally in the next upcoming hours.   Emotional support given to family at this time.

## 2021-10-16 NOTE — Progress Notes (Signed)
Pt extubated to comfort care per physician order and family wishes. Pt suctioned orally and via ETT prior, positive cuff leak. Pt extubated to room air. Pt unable to speak at this time, no stridor heard. RT will be available as needed.

## 2021-10-16 NOTE — Progress Notes (Addendum)
1330- Patient extubation, no distress noted. 1340- Patient Asystole  1340- Autumn, RN verified with Anyah Swallow, RN [facility] called with time of death 59 Family called with time of death 48  Post mortem care performed, patient belongings placed in body bag with patient. Pt belongings: - denture - $22 cash - Cigarettes - keys - clothes  Security paged for other belongs in security from [employer] - Drivers License, Debit Card, [employer].

## 2021-10-16 NOTE — Death Summary Note (Signed)
DEATH SUMMARY   Patient Details  Name: Deborah Ho MRN: [id number] DOB: 25-Mar-1951  Admission/Discharge Information   Admit Date:  10/09/2021  Date of Death: Date of Death: 2021/10/15  Time of Death: Time of Death: 1340  Length of Stay: 5  Referring Physician: Grayce Sessions, NP   Reason(s) for Hospitalization  Cardiac arrest  Diagnoses  Preliminary cause of death:  Community acquired pneumonia Secondary Diagnoses (including complications and co-morbidities):  Principal Problem:   Cardiac arrest [facility]) Active Problems:   Acute on chronic systolic heart failure (HCC)   Primary hypertension   Encephalopathy acute   Renal insufficiency   Brief Hospital Course (including significant findings, care, treatment, and services provided and events leading to death)  70 yo F PMH ICM Chronic systolic HF CKD3b HTN covid infection morbid obesity who was at a bus stop complaining of not feeling well when she collapsed, witnessed. EMS called, until arrival. On arrival was pulseless and received DF x1 though initial rhythm is not known, followed by further ACLS for about 30-40 minutes on and off -- PEA throughout. Arrived to ED on [facility] device, pulseless, and achieved ROSC when intubated by EDP. The patient has received no sedation or RSI medications.  ECG did not show STEMI   Patient was notably recently admitted (07/2021) with hypoxic respiratory failure with pulmonary edema requiring mechanical ventilation due to acute exacerbation of systolic HF. At that time, EF 20-25%)   Hospital course 2023/10/11 presented to ED actively receiving CPR after witnessed collapse at bus stop. Downtime 30 min, did briefly achieve ROSC a few times in field. Admitted to the ICU on mechanical ventilation, TTM protocol initiated.   11/21 remains intubated, off sedation this AM with poor neuro exam, no pressor requirement 11/22 Urine output dropped off significantly overnight with increased creatinine of 3.90  and BUN 50 11/23 worsening multisystem organ dysfunction including neurological, renal, and liver injuries.  Mentation also decreased but patent was on low dose fentanyl drip 10-16-2023 family conversation: they have elected to withdraw care  Pertinent Labs and Studies  Significant Diagnostic Studies CT HEAD WO CONTRAST ( )  Result Date: 10/03/2021 CLINICAL DATA:  Altered mental status EXAM: CT HEAD WITHOUT CONTRAST TECHNIQUE: Contiguous axial images were obtained from the base of the skull through the vertex without intravenous contrast. COMPARISON:  MR brain done on 12/15/2018 FINDINGS: Brain: There are no signs of bleeding within the cranium. Ventricles are not dilated. There is no shift of midline structures. Cortical sulci are prominent. There is decreased density in the left posterior parietal cortex. This finding was not evident in the previous MR brain done on 12/15/2018. There is decreased density in the periventricular and subcortical white matter in both cerebral hemispheres. Vascular: Unremarkable. Skull: Unremarkable. Sinuses/Orbits: There is mild mucosal thickening in the ethmoid sinus. Other: None IMPRESSION: There are no signs of bleeding within the cranium. There is no focal mass effect. There is new area of low-density in the left posterior parietal cortex suggesting age indeterminate infarction. If clinically warranted, follow-up MRI may be considered. Atrophy. Small-vessel disease. Electronically Signed   By: Ernie Avena M.D.   On: 10/03/2021 11:25   CT Angio Chest Pulmonary Embolism (PE) W or WO Contrast  Result Date: 10/06/2021 CLINICAL DATA:  Found unresponsive, difficulty breathing EXAM: CT ANGIOGRAPHY CHEST WITH CONTRAST TECHNIQUE: Multidetector CT imaging of the chest was performed using the standard protocol during bolus administration of intravenous contrast. Multiplanar CT image reconstructions and MIPs were obtained to evaluate the  vascular anatomy. CONTRAST:   OMNIPAQUE IOHEXOL 350 MG/ML SOLN COMPARISON:  08/05/2021 FINDINGS: Cardiovascular: Heart is enlarged in size. Coronary artery calcifications are seen. Contrast density in the thoracic aorta is less than adequate to evaluate the lumen. There are no intraluminal filling defects in the central pulmonary artery branches. Evaluation of small peripheral subsegmental branches is limited by infiltrates and motion artifacts. Mediastinum/Nodes: No significant interval changes are noted in the slightly enlarged lymph nodes. Tip endotracheal tube is proximally 2.2 cm above the carina. Enteric tube is noted traversing the esophagus. Lungs/Pleura: There are patchy alveolar infiltrates in both lower lobes with some interval worsening. There are patchy ground-glass and alveolar infiltrates in both upper and both mid lung fields with interval worsening. There are patchy alveolar infiltrates in the posterior segment in both upper lobes with improvement in the left upper lobe and worsening in the right upper lobe. There are no cavitary lesions in the lung fields. There is interval decrease in AP diameter of mainstem bronchi. Small right pleural effusion is seen. There is possible minimal left pleural effusion. There is no pneumothorax. Upper Abdomen: There is possible cyst in the lateral margin of right kidney. Tip enteric tube is seen in the antrum of the stomach. Musculoskeletal: Unremarkable Review of the MIP images confirms the above findings. IMPRESSION: There is no evidence of central pulmonary artery embolism. Evaluation of small peripheral subsegmental branches is limited by infiltrates and motion artifacts. There are ground-glass and alveolar infiltrates in both lungs with overall worsening. Findings suggest bilateral pneumonia. Small pleural effusions, more so on the right side. Coronary artery calcifications are seen. Possible right renal cysts. Electronically Signed   By: Ernie Avena M.D.   On: 10-31-2021 11:38    MR BRAIN WO CONTRAST  Result Date: 10/08/2021 CLINICAL DATA:  Anoxic brain damage.  Cardiac arrest. EXAM: MRI HEAD WITHOUT CONTRAST TECHNIQUE: Multiplanar, multiecho pulse sequences of the brain and surrounding structures were obtained without intravenous contrast. COMPARISON:  Head CT 2021-10-31 and MRI 12/15/2018 FINDINGS: Brain: There is widespread, symmetric restricted diffusion throughout the cerebral cortex, basal ganglia, thalami, and cerebellum. There is associated edema without midline shift or other evidence of herniation. A chronic infarct is noted at the junction of the left temporal, occipital, and parietal lobes with some associated chronic blood products. T2 hyperintensities in the cerebral white matter bilaterally elsewhere are similar to the prior MRI and are nonspecific but compatible with mild chronic small vessel ischemic disease. Small chronic right thalamic and left cerebellar infarcts are unchanged from the prior MRI. Cerebral volume is normal for age. There is no extra-axial fluid collection or mass. Vascular: Major intracranial vascular flow voids are preserved. Skull and upper cervical spine: Unremarkable bone marrow signal. Sinuses/Orbits: Unremarkable orbits. Mild mucosal thickening in the paranasal sinuses. Trace bilateral mastoid fluid. Other: None. IMPRESSION: 1. Findings of diffuse hypoxic-ischemic injury. 2. Chronic infarcts as above. Electronically Signed   By: Sebastian Ache M.D.   On: 10/08/2021 18:36   DG Chest Port 1 View  Result Date: 10/06/2021 CLINICAL DATA:  Difficulty breathing, hypoxia EXAM: PORTABLE CHEST 1 VIEW COMPARISON:  Previous studies including the examination of 10/31/21 FINDINGS: Transverse diameter of heart is increased. There is interval clearing of pulmonary vascular congestion. There are no new focal infiltrates. There is no pleural effusion or pneumothorax. Tip of endotracheal tube is 3.3 cm above the carina. Enteric tube is noted traversing the  esophagus. IMPRESSION: Cardiomegaly. There is interval clearing of pulmonary vascular congestion and pulmonary edema.  There are no new focal pulmonary infiltrates. Electronically Signed   By: Ernie Avena M.D.   On: 10/06/2021 08:25   DG Chest Portable 1 View  Result Date: 10/14/2021 CLINICAL DATA:  Tube placement. EXAM: PORTABLE CHEST 1 VIEW COMPARISON:  12/05/2020 FINDINGS: Endotracheal tube is present with tip 3.2 cm above the carina. Lungs are adequately inflated with hazy bilateral perihilar opacification suggesting mild interstitial edema. No evidence of effusion. Mild stable cardiomegaly. Remainder the exam is unchanged. IMPRESSION: 1. Hazy bilateral perihilar opacification suggesting mild interstitial edema. 2. Endotracheal tube with tip 3.2 cm above the carina. Electronically Signed   By: Elberta Fortis M.D.   On: 10/07/2021 10:22   EEG adult  Result Date: 10/06/2021 Charlsie Quest, MD     10/06/2021  6:23 PM Patient Name: Tashina Credit MRN: [id number] Epilepsy Attending: Charlsie Quest Referring Physician/Provider: Gleason, Darcella Gasman, PA-C Date: 10/06/2021 Duration: 23.04 mins Patient history: 70yo F s/p cardiac arrest. EEG to evaluate for seizure Level of alertness: comatose AEDs during EEG study: None Technical aspects: This EEG study was done with scalp electrodes positioned according to the 10-20 International system of electrode placement. Electrical activity was acquired at a sampling rate of 500Hz  and reviewed with a high frequency filter of 70Hz  and a low frequency filter of 1Hz . EEG data were recorded continuously and digitally stored. Description: No posterior dominant rhythm was seen. EEG showed continuous generalized 3 to 6 Hz theta-delta slowing. Sharp waves were noted in left anterior temporal region, maximal F7 which at times appear qasi-periodic at 1hz . Hyperventilation and photic stimulation were not performed.   ABNORMALITY - Sharp wave, left anterior temporal region -  Continuous slow, generalized IMPRESSION: This study showed evidence of potential epileptogenicity arising from left anterior temporal region. Additionally there is moderate to severe diffuse encephalopathy, nonspecific etiology. No seizures were seen throughout the recording.   ECHOCARDIOGRAM COMPLETE  Result Date: 10/03/2021    ECHOCARDIOGRAM REPORT   Patient Name:   TYREA FROBERG Date of Exam: 10/15/2021 Medical Rec #:  Charlsie Quest      Height:       67.0 in Accession #:    10/07/2021     Weight:       236.8 lb Date of Birth:  1951-06-11      BSA:          2.173 m Patient Age:    70 years       BP:           138/91 mmHg Patient Gender: F              HR:           76 bpm. Exam Location:  Inpatient Procedure: 2D Echo, Cardiac Doppler, Color Doppler and Intracardiac            Opacification Agent Indications:    R00.9* Unspecified abnormalities of heart beat. Cardiac arrest.  History:        Patient has prior history of Echocardiogram examinations, most                 recent 08/05/2021. CHF, Previous Myocardial Infarction and CAD,                 Arrythmias:Cardiac Arrest; Risk Factors:Hypertension,                 Dyslipidemia and Current Smoker. Hypoxia.  Sonographer:    [id number] RDCS Referring Phys: [id number] GRACE E BOWSER  Sonographer  Comments: Technically difficult study due to poor echo windows, suboptimal parasternal window, suboptimal apical window, suboptimal subcostal window, patient is morbidly obese and echo performed with patient supine and on artificial respirator. Image acquisition challenging due to patient body habitus. Extremely difficult windows. IMPRESSIONS  1. Left ventricular ejection fraction, by estimation, is 20 to 25%. The left ventricle has severely decreased function. The left ventricle demonstrates global hypokinesis. There is mild left ventricular hypertrophy. Left ventricular diastolic parameters  were normal. Elevated left atrial pressure.  2. Right ventricular  systolic function is normal. The right ventricular size is normal. Tricuspid regurgitation signal is inadequate for assessing PA pressure.  3. The mitral valve is normal in structure. Trivial mitral valve regurgitation. No evidence of mitral stenosis.  4. The aortic valve was not well visualized. Aortic valve regurgitation is not visualized. No aortic stenosis is present.  5. The inferior vena cava is normal in size with greater than 50% respiratory variability, suggesting right atrial pressure of 3 mmHg. FINDINGS  Left Ventricle: Left ventricular ejection fraction, by estimation, is 20 to 25%. The left ventricle has severely decreased function. The left ventricle demonstrates global hypokinesis. Definity contrast agent was given IV to delineate the left ventricular endocardial borders. The left ventricular internal cavity size was normal in size. There is mild left ventricular hypertrophy. Left ventricular diastolic parameters were normal. Elevated left atrial pressure. Right Ventricle: The right ventricular size is normal. No increase in right ventricular wall thickness. Right ventricular systolic function is normal. Tricuspid regurgitation signal is inadequate for assessing PA pressure. Left Atrium: Left atrial size was normal in size. Right Atrium: Right atrial size was normal in size. Pericardium: There is no evidence of pericardial effusion. Mitral Valve: The mitral valve is normal in structure. There is mild thickening of the mitral valve leaflet(s). There is mild calcification of the mitral valve leaflet(s). Mild mitral annular calcification. Trivial mitral valve regurgitation. No evidence  of mitral valve stenosis. MV peak gradient, 8.8 mmHg. The mean mitral valve gradient is 3.0 mmHg. Tricuspid Valve: The tricuspid valve is normal in structure. Tricuspid valve regurgitation is not demonstrated. No evidence of tricuspid stenosis. Aortic Valve: The aortic valve was not well visualized. Aortic valve  regurgitation is not visualized. No aortic stenosis is present. Aortic valve mean gradient measures 4.9 mmHg. Aortic valve peak gradient measures 10.6 mmHg. Aortic valve area, by VTI measures 2.55 cm. Pulmonic Valve: The pulmonic valve was not well visualized. Pulmonic valve regurgitation is not visualized. No evidence of pulmonic stenosis. Aorta: The aortic root is normal in size and structure. Venous: The inferior vena cava is normal in size with greater than 50% respiratory variability, suggesting right atrial pressure of 3 mmHg. IAS/Shunts: No atrial level shunt detected by color flow Doppler.  LEFT VENTRICLE PLAX 2D LVIDd:         5.60 cm      Diastology LVIDs:         5.30 cm      LV e' medial:    3.75 cm/s LV PW:         1.20 cm      LV E/e' medial:  13.9 LV IVS:        1.20 cm      LV e' lateral:   3.90 cm/s LVOT diam:     2.00 cm      LV E/e' lateral: 13.4 LV SV:         59 LV SV Index:   27 LVOT Area:  3.14 cm  LV Volumes (MOD) LV vol d, MOD A2C: 258.0 ml LV vol d, MOD A4C: 231.0 ml LV vol s, MOD A2C: 212.0 ml LV vol s, MOD A4C: 172.0 ml LV SV MOD A2C:     46.0 ml LV SV MOD A4C:     231.0 ml LV SV MOD BP:      46.9 ml RIGHT VENTRICLE             IVC RV S prime:     11.00 cm/s  IVC diam: 1.70 cm TAPSE (M-mode): 2.0 cm LEFT ATRIUM             Index        RIGHT ATRIUM           Index LA diam:        3.50 cm 1.61 cm/m   RA Area:     14.55 cm LA Vol (A2C):   49.6 ml 22.83 ml/m  RA Volume:   38.70 ml  17.81 ml/m LA Vol (A4C):   47.9 ml 22.05 ml/m LA Biplane Vol: 49.4 ml 22.74 ml/m  AORTIC VALVE AV Area (Vmax):    2.45 cm AV Area (Vmean):   2.23 cm AV Area (VTI):     2.55 cm AV Vmax:           162.57 cm/s AV Vmean:          102.057 cm/s AV VTI:            0.230 m AV Peak Grad:      10.6 mmHg AV Mean Grad:      4.9 mmHg LVOT Vmax:         127.00 cm/s LVOT Vmean:        72.300 cm/s LVOT VTI:          0.187 m LVOT/AV VTI ratio: 0.81  AORTA Ao Root diam: 2.80 cm Ao Asc diam:  3.20 cm MITRAL VALVE MV  Area (PHT): 4.15 cm     SHUNTS MV Area VTI:   2.09 cm     Systemic VTI:  0.19 m MV Peak grad:  8.8 mmHg     Systemic Diam: 2.00 cm MV Mean grad:  3.0 mmHg MV Vmax:       1.48 m/s MV Vmean:      85.2 cm/s MV Decel Time: 183 msec MV E velocity: 52.20 cm/s MV A velocity: 151.00 cm/s MV E/A ratio:  0.35 Dina Rich MD Electronically signed by Dina Rich MD Signature Date/Time: 10/31/2021/4:28:30 PM    Final     Microbiology No results found for this or any previous visit (from the past 240 hour(s)).  Lab Basic Metabolic Panel: Recent Labs  Lab 10/09/2021 0514  NA 141  K 5.0  CL 104  CO2 19*  GLUCOSE 149*  BUN 157*  CREATININE 8.47*  CALCIUM 7.7*   Liver Function Tests: Recent Labs  Lab 10/09/2021 0514  AST 62*  ALT 34  ALKPHOS 141*  BILITOT 0.6  PROT 5.9*  ALBUMIN 2.1*   No results for input(s): LIPASE, AMYLASE in the last 168 hours. No results for input(s): AMMONIA in the last 168 hours. CBC: No results for input(s): WBC, NEUTROABS, HGB, HCT, MCV, PLT in the last 168 hours. Cardiac Enzymes: No results for input(s): CKTOTAL, CKMB, CKMBINDEX, TROPONINI in the last 168 hours. Sepsis Labs: No results for input(s): PROCALCITON, WBC, LATICACIDVEN in the last 168 hours.  Procedures/Operations  11/20 Foley  11/20 Aline  11/21 ETT  Heber Whigham 10/15/2021, 5:22 PM

## 2021-10-16 NOTE — Progress Notes (Signed)
[facility] P[facility]M  Family ready for [place] to extubate Orders written  Heber , MD [place] [facility] Pager: [phone] Cell: [phone] After 7:00 pm call Elink  [phone]

## 2021-10-16 DEATH — deceased
